# Patient Record
Sex: Female | Born: 1945
Health system: Southern US, Community
[De-identification: ages and names within clinical notes are randomized; demographics above are authoritative.]

## PROBLEM LIST (undated history)

## (undated) DIAGNOSIS — M5136 Other intervertebral disc degeneration, lumbar region: Secondary | ICD-10-CM

## (undated) DIAGNOSIS — F329 Major depressive disorder, single episode, unspecified: Secondary | ICD-10-CM

## (undated) DIAGNOSIS — R251 Tremor, unspecified: Secondary | ICD-10-CM

## (undated) DIAGNOSIS — E119 Type 2 diabetes mellitus without complications: Secondary | ICD-10-CM

## (undated) DIAGNOSIS — I2510 Atherosclerotic heart disease of native coronary artery without angina pectoris: Secondary | ICD-10-CM

## (undated) DIAGNOSIS — F32A Depression, unspecified: Secondary | ICD-10-CM

## (undated) DIAGNOSIS — K219 Gastro-esophageal reflux disease without esophagitis: Secondary | ICD-10-CM

## (undated) DIAGNOSIS — J449 Chronic obstructive pulmonary disease, unspecified: Secondary | ICD-10-CM

## (undated) DIAGNOSIS — M1990 Unspecified osteoarthritis, unspecified site: Secondary | ICD-10-CM

## (undated) DIAGNOSIS — M5030 Other cervical disc degeneration, unspecified cervical region: Secondary | ICD-10-CM

## (undated) DIAGNOSIS — M51369 Other intervertebral disc degeneration, lumbar region without mention of lumbar back pain or lower extremity pain: Secondary | ICD-10-CM

## (undated) DIAGNOSIS — Z950 Presence of cardiac pacemaker: Secondary | ICD-10-CM

## (undated) DIAGNOSIS — E785 Hyperlipidemia, unspecified: Secondary | ICD-10-CM

## (undated) DIAGNOSIS — I509 Heart failure, unspecified: Secondary | ICD-10-CM

## (undated) DIAGNOSIS — I10 Essential (primary) hypertension: Secondary | ICD-10-CM

## (undated) HISTORY — PX: EYE SURGERY: SHX253

## (undated) HISTORY — DX: Atherosclerotic heart disease of native coronary artery without angina pectoris: I25.10

## (undated) HISTORY — DX: Gastro-esophageal reflux disease without esophagitis: K21.9

## (undated) HISTORY — PX: PACEMAKER INSERTION: SHX728

## (undated) HISTORY — DX: Unspecified osteoarthritis, unspecified site: M19.90

## (undated) HISTORY — DX: Other cervical disc degeneration, unspecified cervical region: M50.30

## (undated) HISTORY — DX: Tremor, unspecified: R25.1

## (undated) HISTORY — PX: SPINE SURGERY: SHX786

## (undated) HISTORY — DX: Hyperlipidemia, unspecified: E78.5

## (undated) HISTORY — DX: Other intervertebral disc degeneration, lumbar region: M51.36

## (undated) HISTORY — PX: FOOT SURGERY: SHX648

## (undated) HISTORY — DX: Other intervertebral disc degeneration, lumbar region without mention of lumbar back pain or lower extremity pain: M51.369

## (undated) HISTORY — DX: Presence of cardiac pacemaker: Z95.0

## (undated) HISTORY — PX: ABDOMINAL HYSTERECTOMY: SHX81

## (undated) HISTORY — PX: CHOLECYSTECTOMY: SHX55

---

## 1992-03-06 HISTORY — PX: BREAST BIOPSY: SHX20

## 2011-08-14 ENCOUNTER — Ambulatory Visit: Payer: Self-pay | Admitting: Rheumatology

## 2011-11-01 ENCOUNTER — Ambulatory Visit: Payer: Self-pay | Admitting: Pain Medicine

## 2011-12-06 ENCOUNTER — Ambulatory Visit: Payer: Self-pay | Admitting: Pain Medicine

## 2011-12-12 ENCOUNTER — Ambulatory Visit: Payer: Self-pay | Admitting: Pain Medicine

## 2011-12-27 ENCOUNTER — Ambulatory Visit: Payer: Self-pay | Admitting: Pain Medicine

## 2011-12-28 ENCOUNTER — Ambulatory Visit: Payer: Self-pay | Admitting: Pain Medicine

## 2012-01-17 ENCOUNTER — Ambulatory Visit: Payer: Self-pay | Admitting: Pain Medicine

## 2012-01-23 ENCOUNTER — Ambulatory Visit: Payer: Self-pay | Admitting: Pain Medicine

## 2012-02-12 ENCOUNTER — Ambulatory Visit: Payer: Self-pay | Admitting: Pain Medicine

## 2012-03-22 ENCOUNTER — Ambulatory Visit: Payer: Self-pay | Admitting: Pain Medicine

## 2012-03-28 ENCOUNTER — Ambulatory Visit: Payer: Self-pay | Admitting: Pain Medicine

## 2012-04-11 ENCOUNTER — Ambulatory Visit: Payer: Self-pay | Admitting: Pain Medicine

## 2012-04-29 ENCOUNTER — Ambulatory Visit: Payer: Self-pay | Admitting: Pain Medicine

## 2012-05-09 ENCOUNTER — Ambulatory Visit: Payer: Self-pay | Admitting: Pain Medicine

## 2012-05-22 ENCOUNTER — Ambulatory Visit: Payer: Self-pay | Admitting: Pain Medicine

## 2012-07-01 ENCOUNTER — Ambulatory Visit: Payer: Self-pay | Admitting: Nurse Practitioner

## 2012-07-10 ENCOUNTER — Ambulatory Visit: Payer: Self-pay | Admitting: Internal Medicine

## 2012-07-22 IMAGING — CR LEFT WRIST - COMPLETE 3+ VIEW
1 series · 4 of 4 positions shown · non-contrast
Comparison: none

REASON FOR EXAM: pain, arthritis
COMMENTS:

PROCEDURE:     DXR - DXR WRIST LT COMP WITH OBLIQUES  - August 14, 2011  [DATE]
RESULT:     Comparison: None.

[Series 1: pa · 0.17mm/px · 4 of 4 slices shown]
[im 1/4]
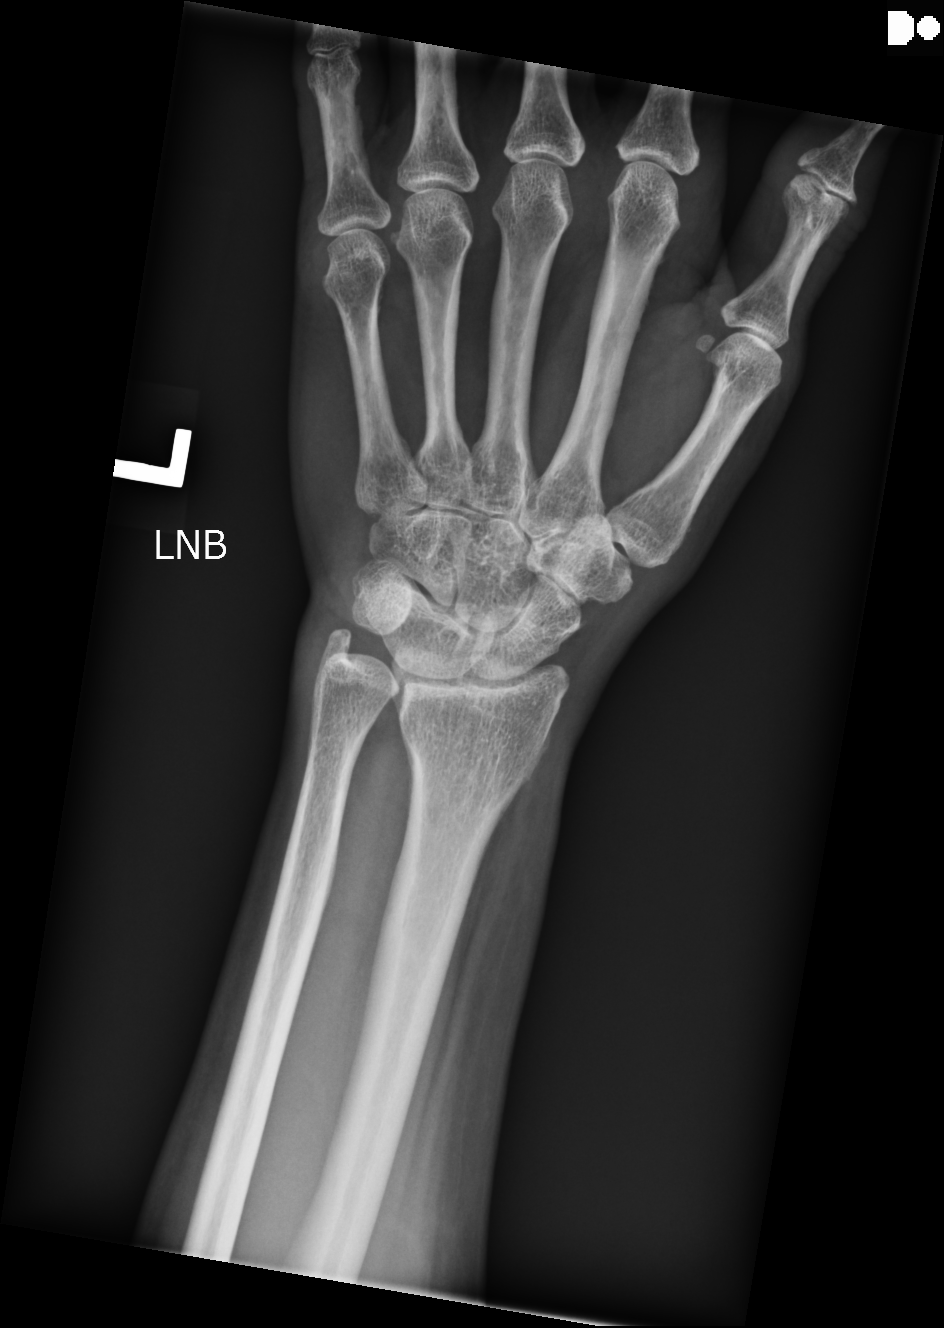
[im 2/4]
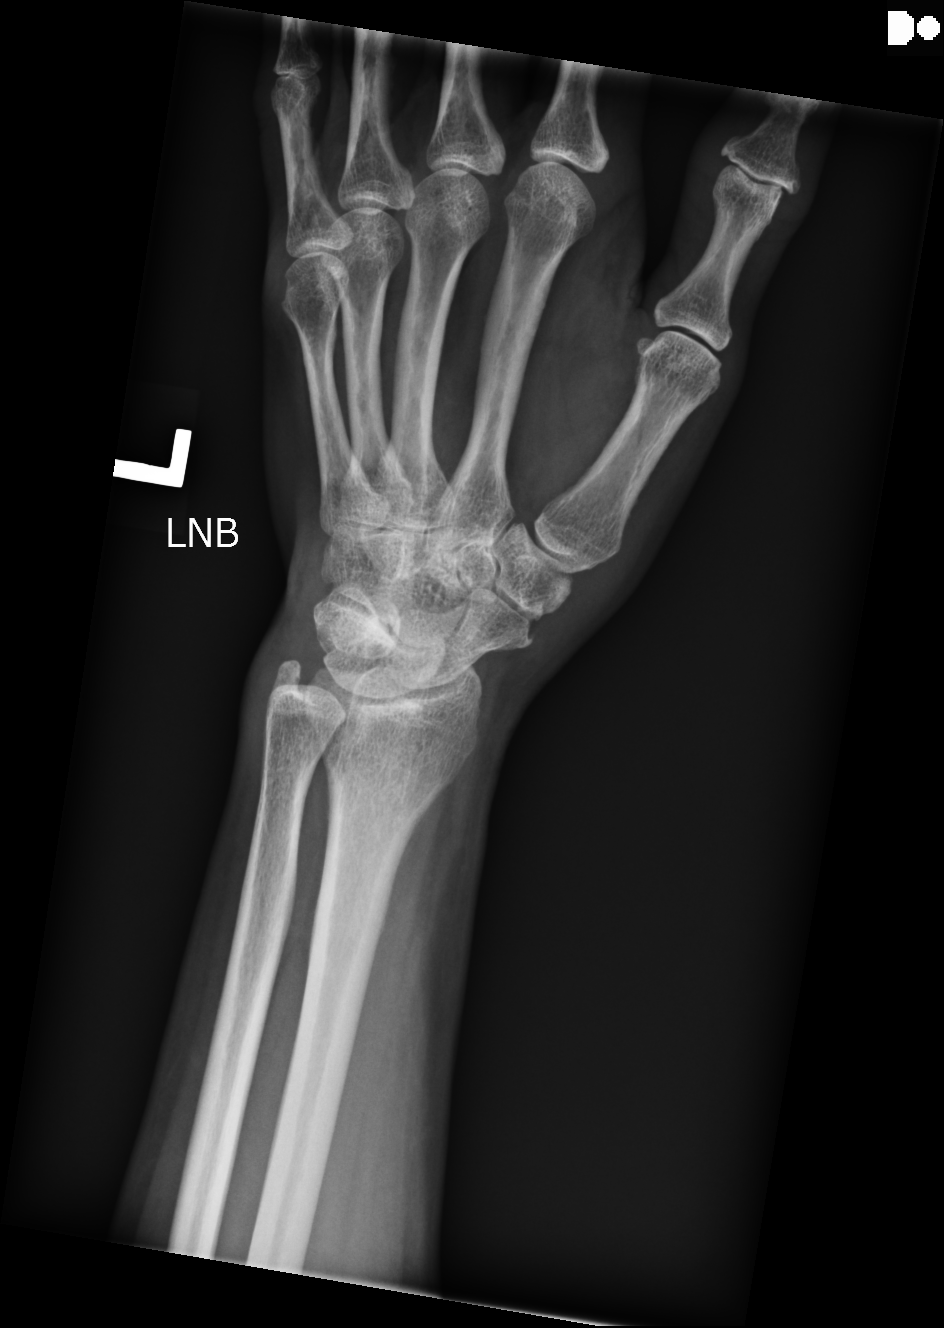
[im 3/4]
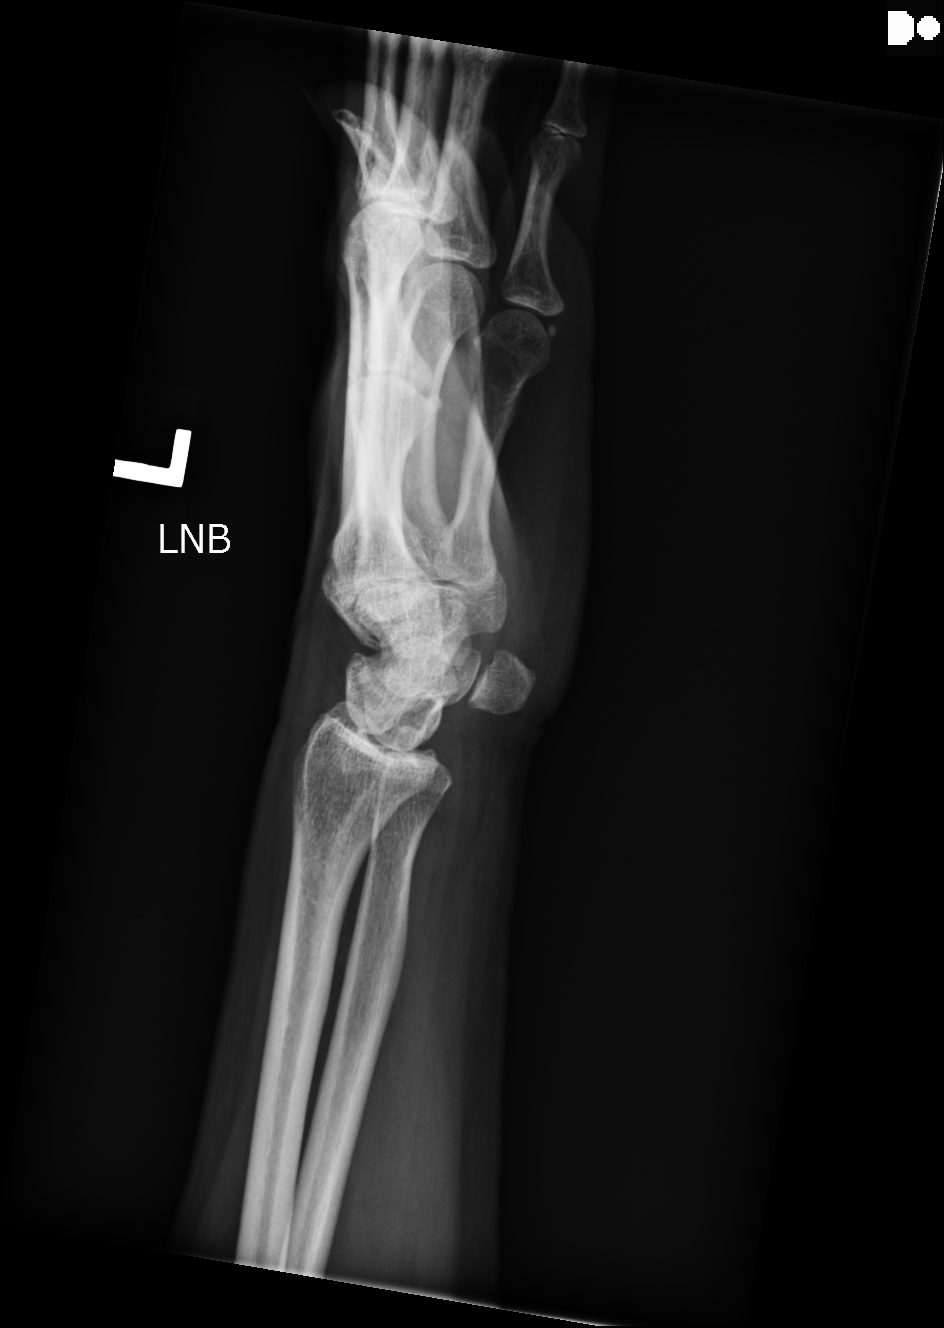
[im 4/4]
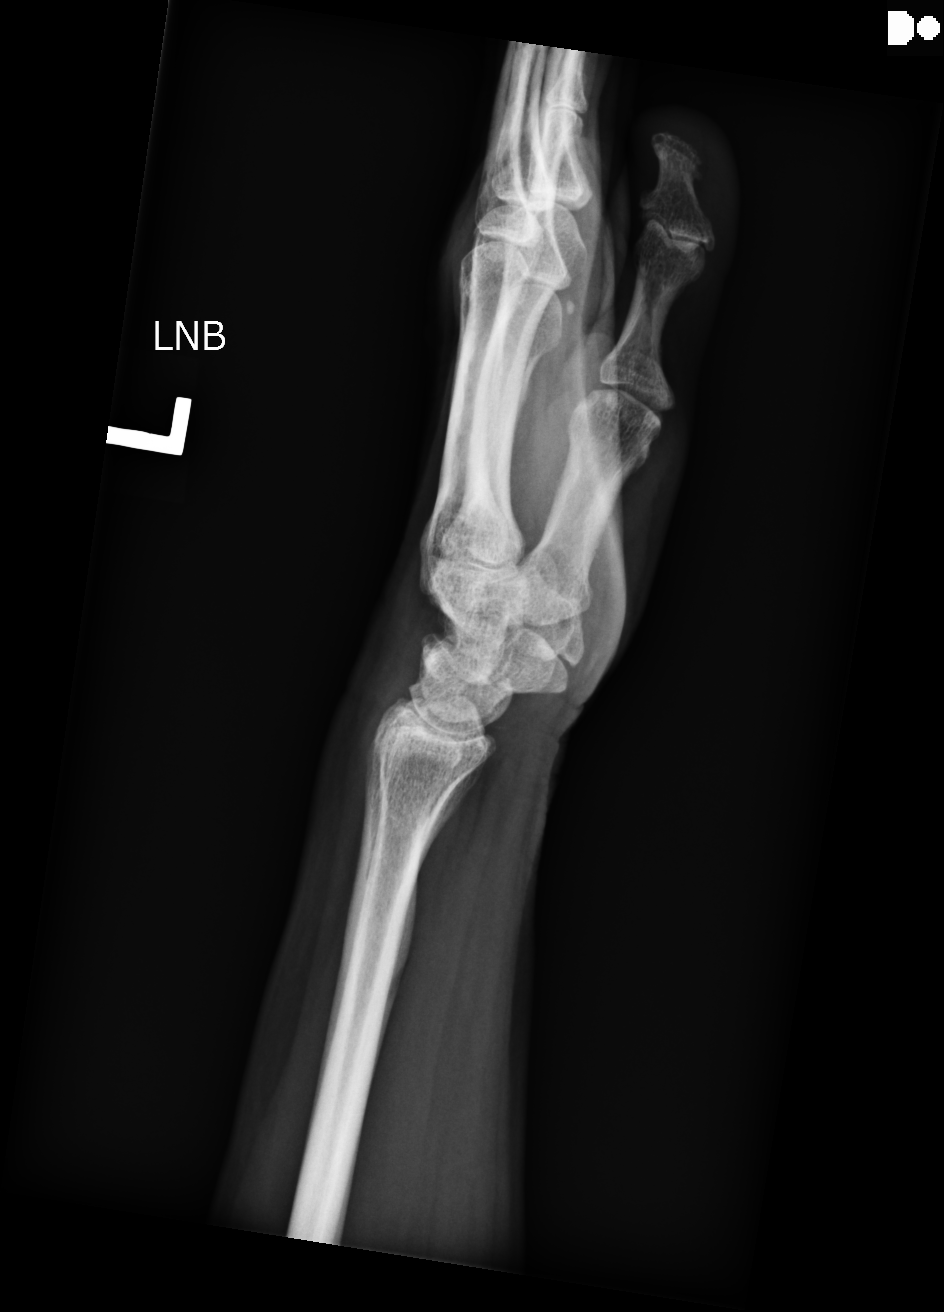

[4 of 4 positions shown; findings below may reference images not displayed]

FINDINGS: No acute fracture. There is congenital fusion of the lunate and triquetrum.
There is normal alignment. Joint spaces are relatively preserved.
IMPRESSION: No acute findings.

If there is continued clinical concern for a radiographically occult
scaphoid fracture, such as snuff box tenderness, further evaluation with MRI
or immobilization and followup radiographs is recommended.

## 2012-07-22 IMAGING — CR DG HAND COMPLETE 3+V*L*
1 series · 3 of 3 positions shown · non-contrast
Comparison: none

REASON FOR EXAM: pain, arthritis
COMMENTS:

PROCEDURE:     DXR - DXR HAND LT COMPLETE  W/OBLIQUES  - August 14, 2011  [DATE]
RESULT:     Comparison: None.

[Series 1: pa · 0.17mm/px · 3 of 3 slices shown]
[im 1/3]
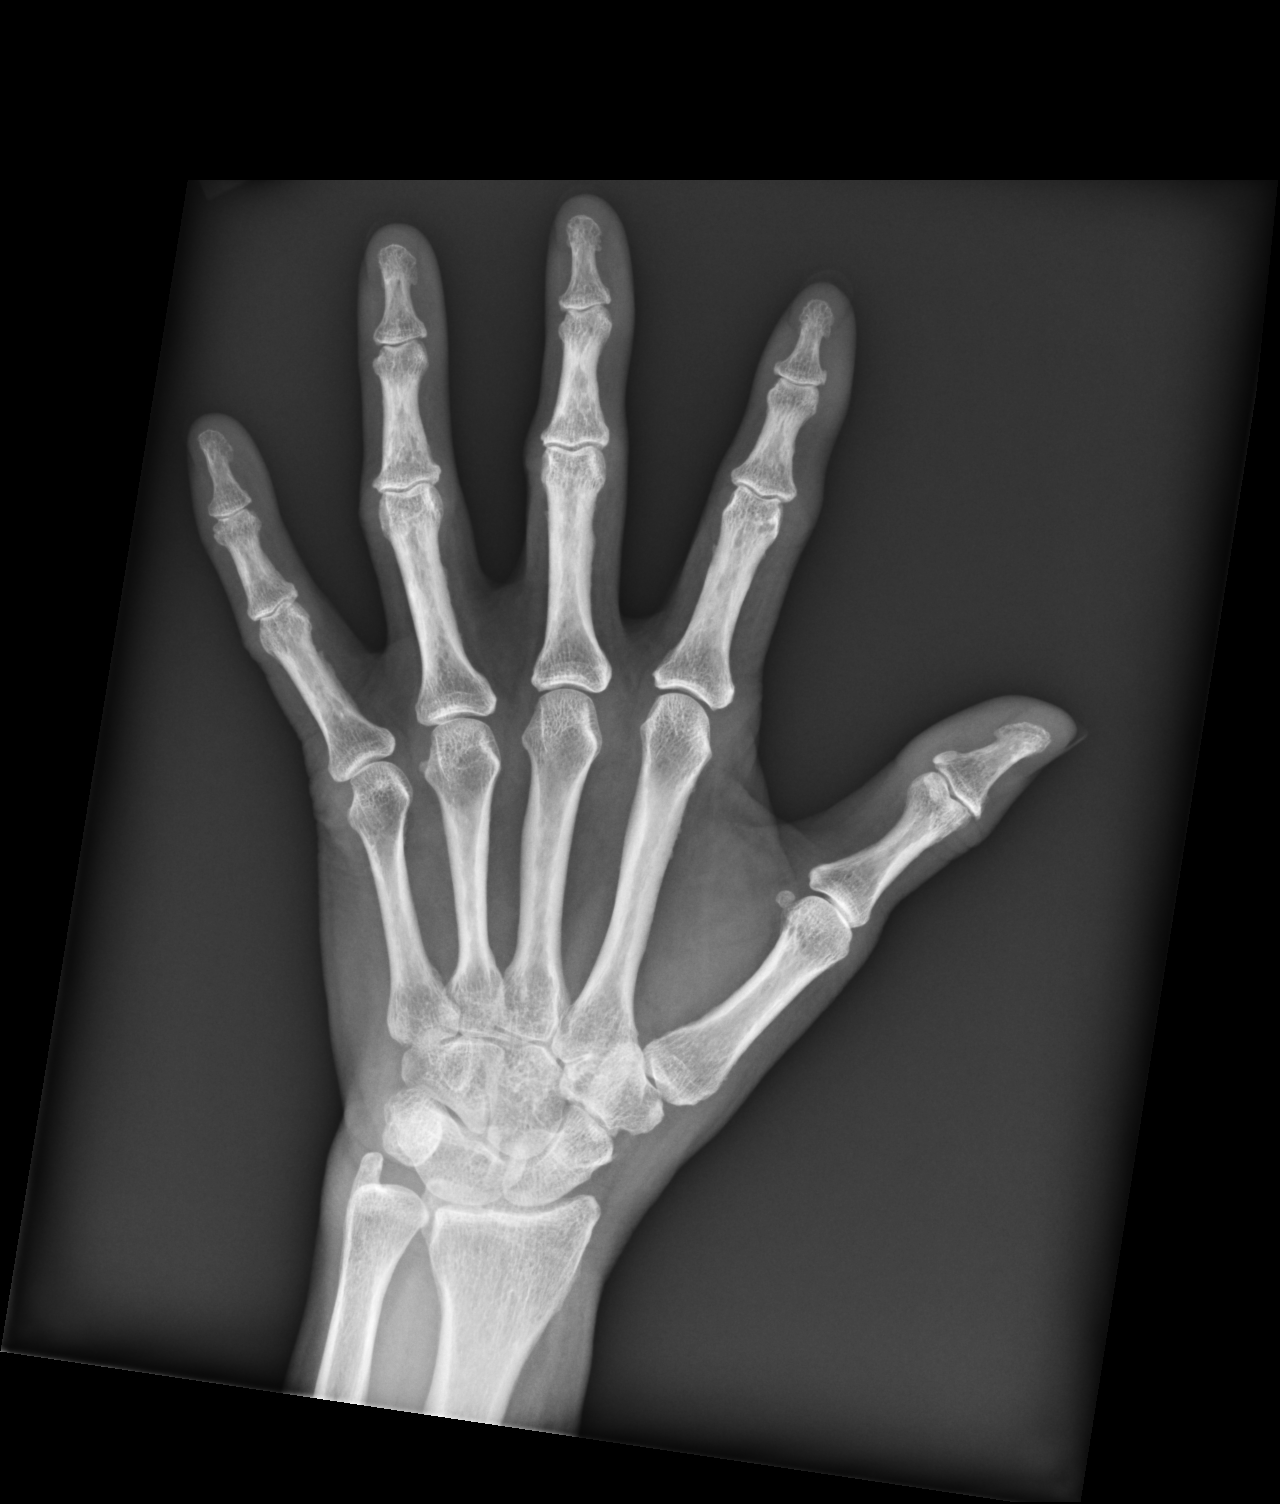
[im 2/3]
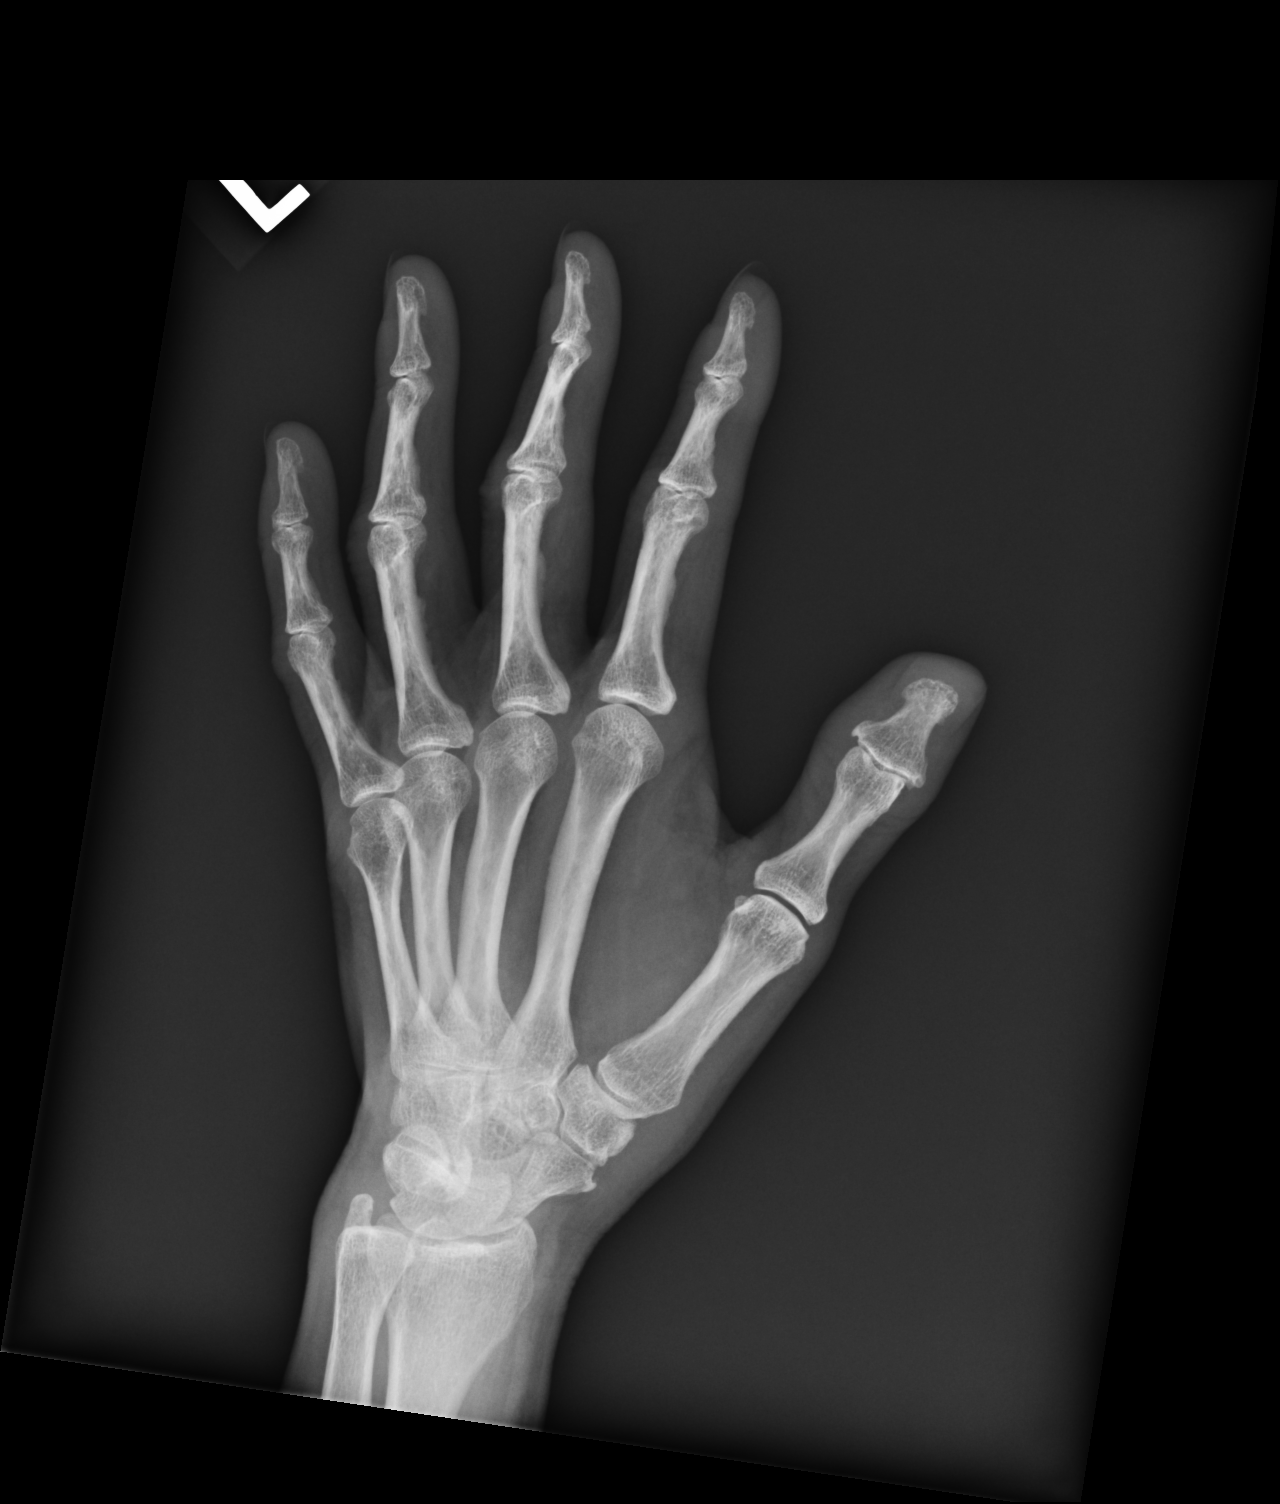
[im 3/3]
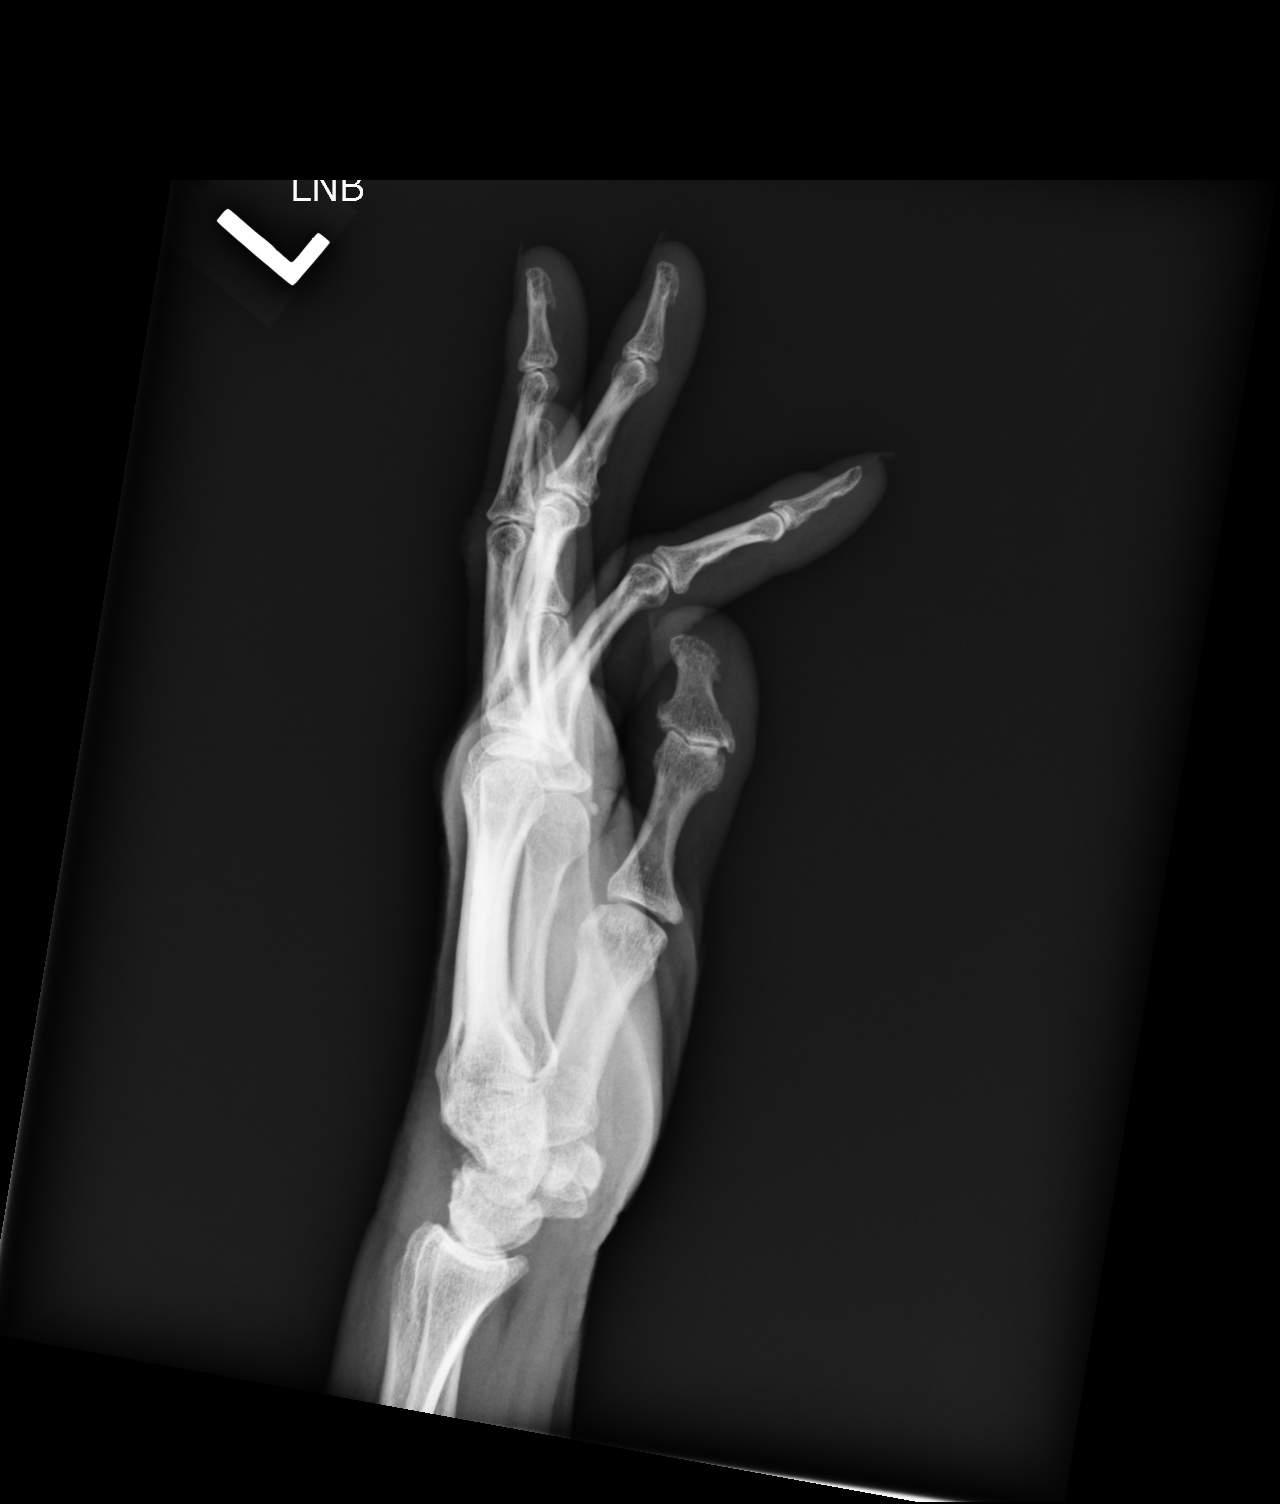

[3 of 3 positions shown; findings below may reference images not displayed]

FINDINGS: There is minimal joint space loss and osteophytosis of the distal and
proximal interphalangeal joints of the index through fifth fingers. This is
seen in the interphalangeal joint of the thumb, as well. No acute fracture.
IMPRESSION: Minimal findings of osteoarthritis.

## 2012-08-01 ENCOUNTER — Ambulatory Visit: Payer: Self-pay | Admitting: Nurse Practitioner

## 2012-08-04 ENCOUNTER — Ambulatory Visit: Payer: Self-pay | Admitting: Nurse Practitioner

## 2012-10-09 IMAGING — CR BILATERAL SACROILIAC JOINTS - 3+ VIEW
1 series · 3 of 3 positions shown · non-contrast
Comparison: none

REASON FOR EXAM: hip pain, low back pain, neck pain, generalized pain
COMMENTS:

PROCEDURE:     DXR - DXR SACROILIAC JOINTS  - November 01, 2011  [DATE]
RESULT:     Comparison: None.

[Series 1: t sacrum ap · 0.14mm/px · 3 of 3 slices shown]
[im 1/3]
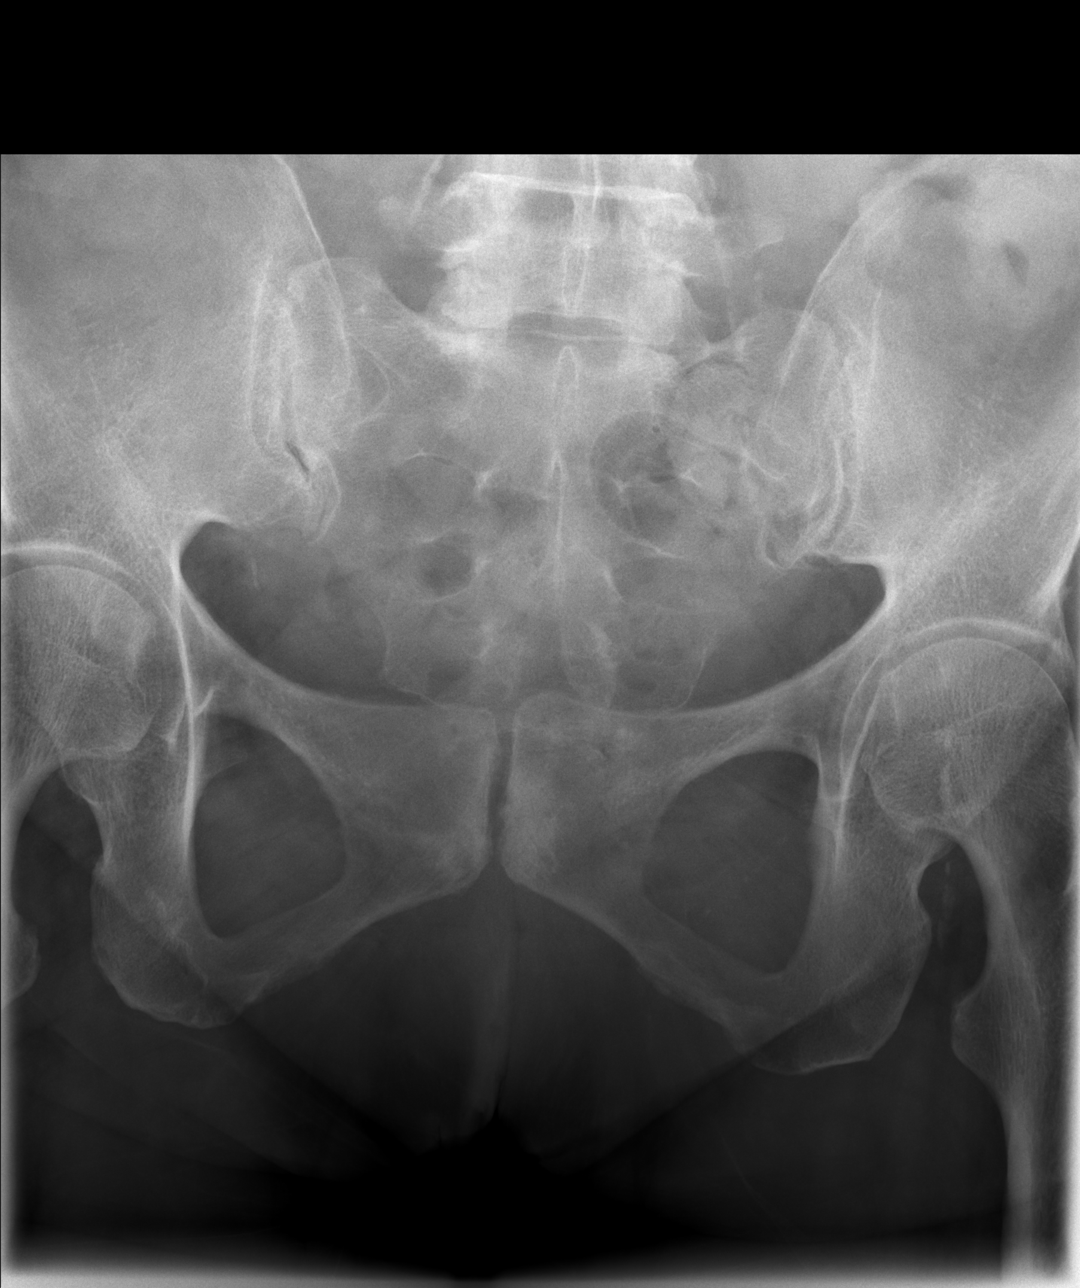
[im 2/3]
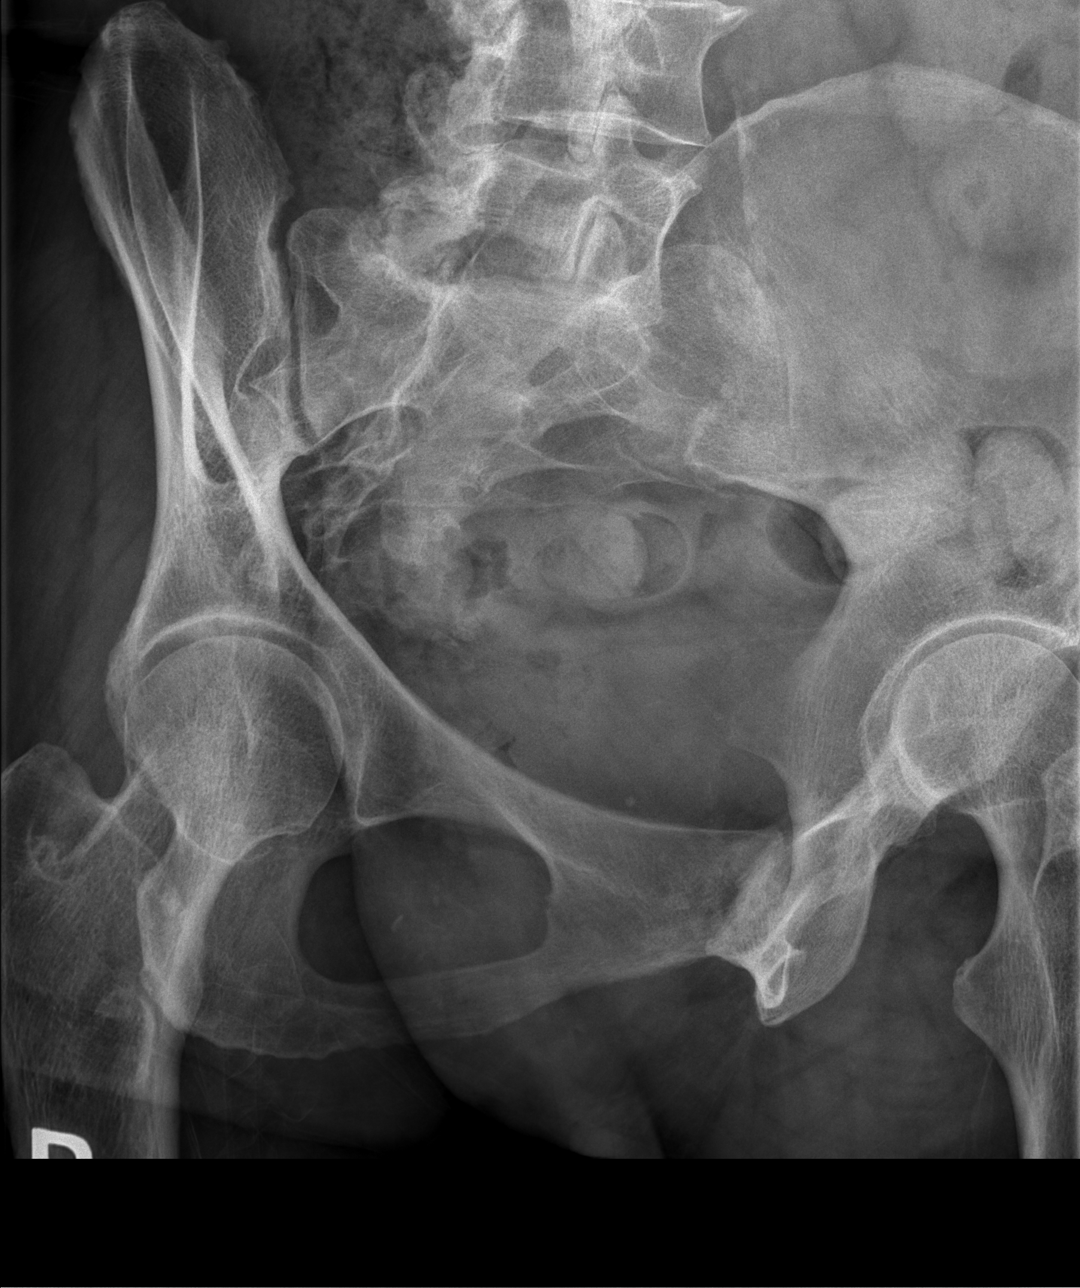
[im 3/3]
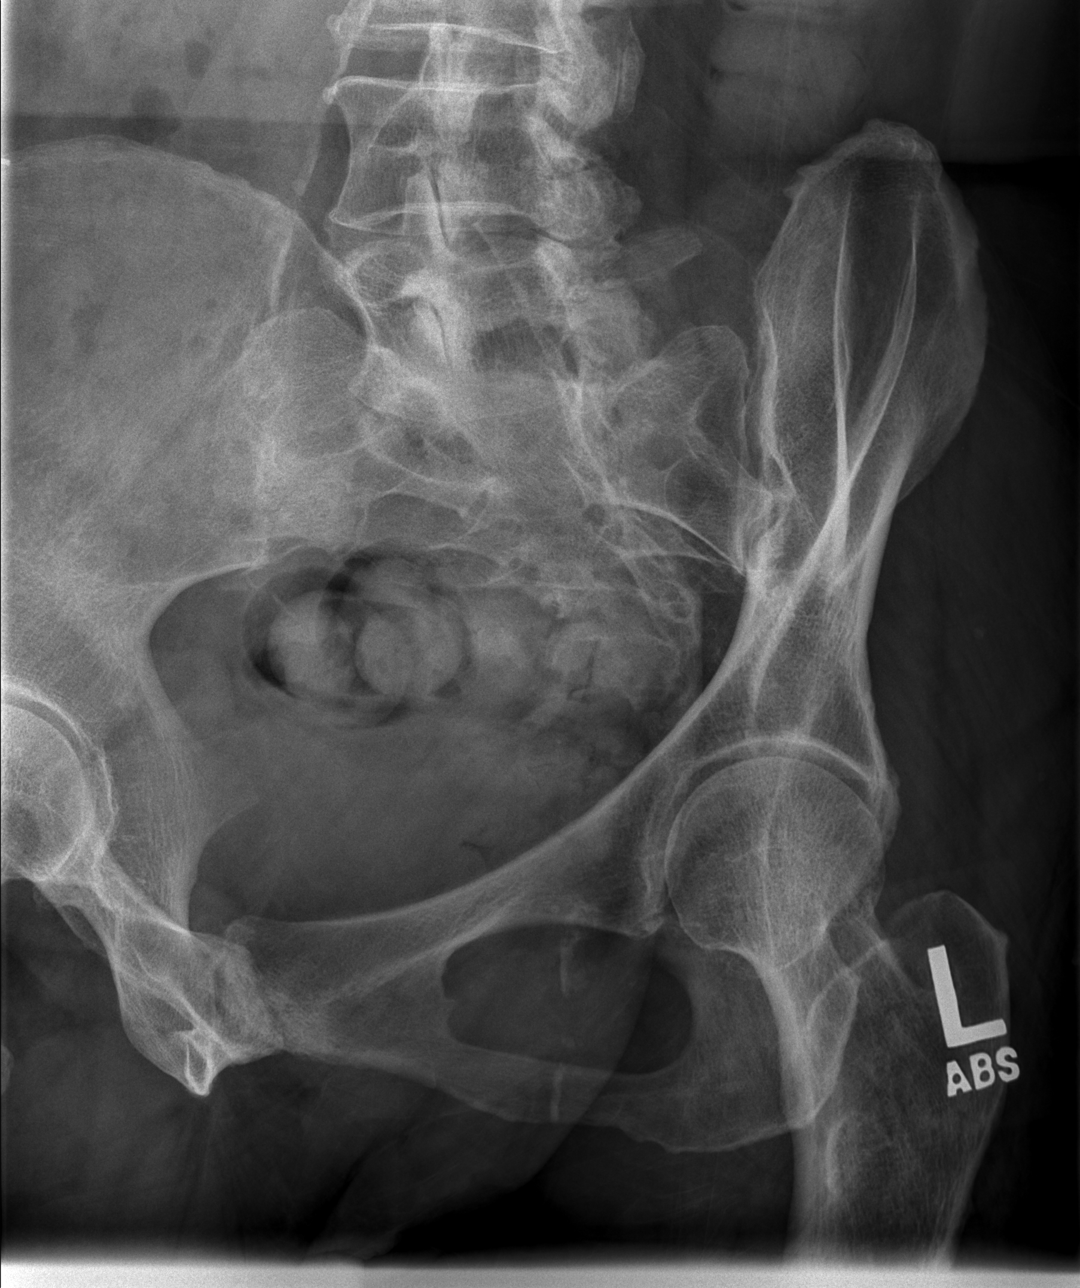

[3 of 3 positions shown; findings below may reference images not displayed]

FINDINGS: The sacroiliac joints are patent. No erosive changes. There is mild
sclerosis about the pubic symphysis, likely secondary to reactive changes.
IMPRESSION: Unremarkable sacroiliac joints.

[REDACTED]

## 2012-10-09 IMAGING — CR DG HIP COMPLETE 2+V*L*
1 series · 2 of 2 positions shown · non-contrast
Comparison: none

REASON FOR EXAM: hip pain, low back pain, neck pain, generalized pain
COMMENTS:

PROCEDURE:     DXR - DXR HIP LEFT COMPLETE  - November 01, 2011  [DATE]
RESULT:     Comparison: None.

[Series 1: t hip ap left · 0.14mm/px · 2 of 2 slices shown]
[im 1/2]
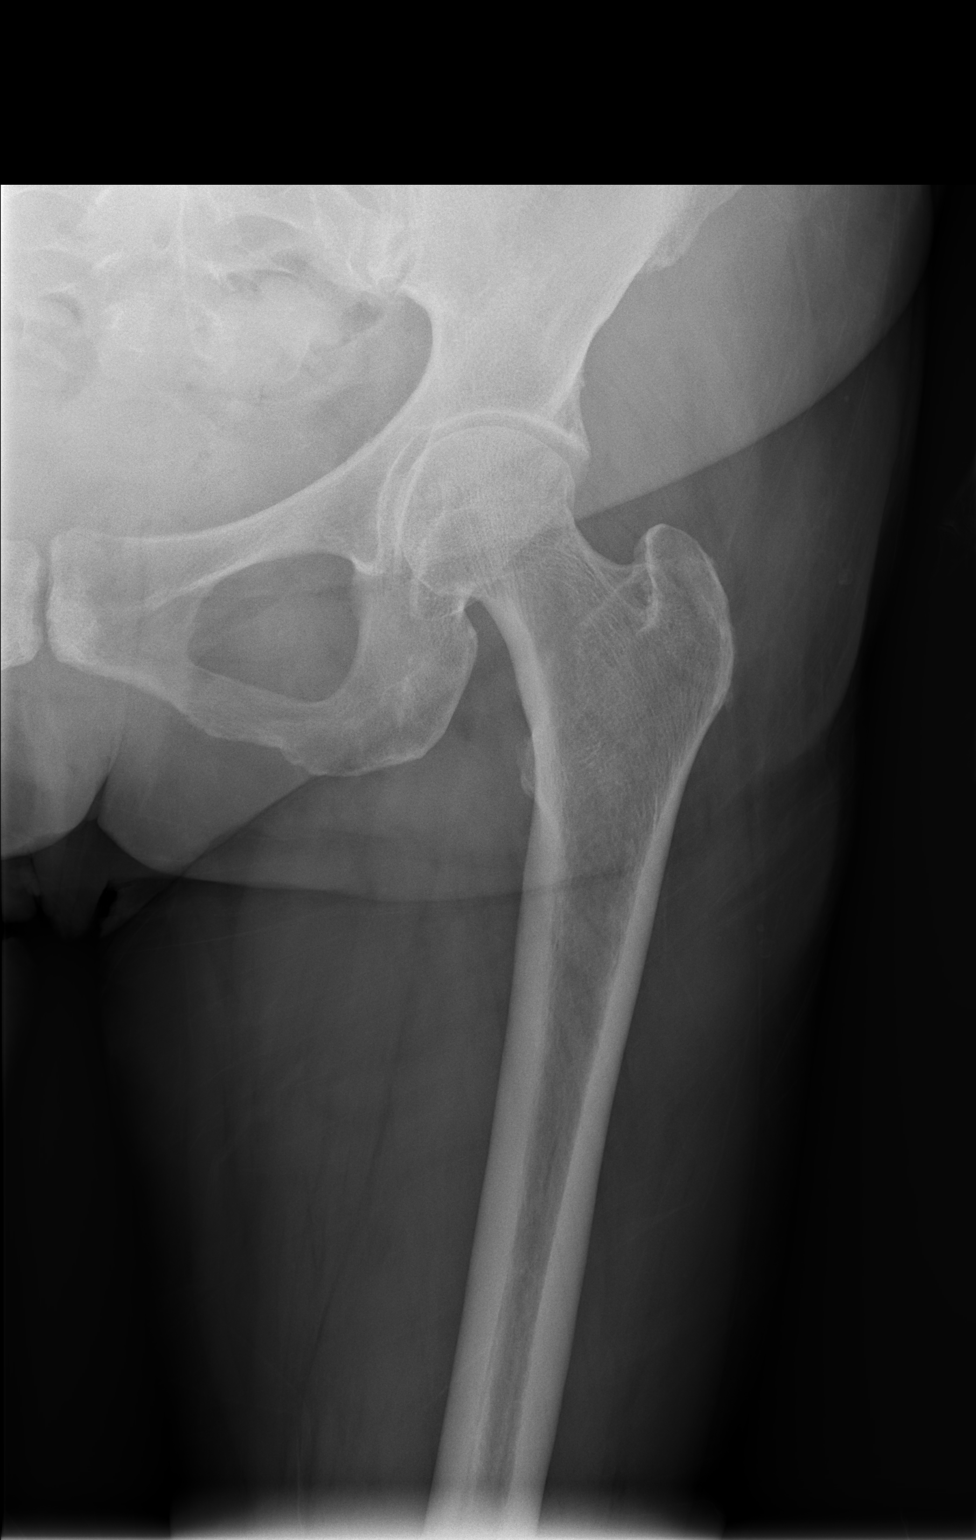
[im 2/2]
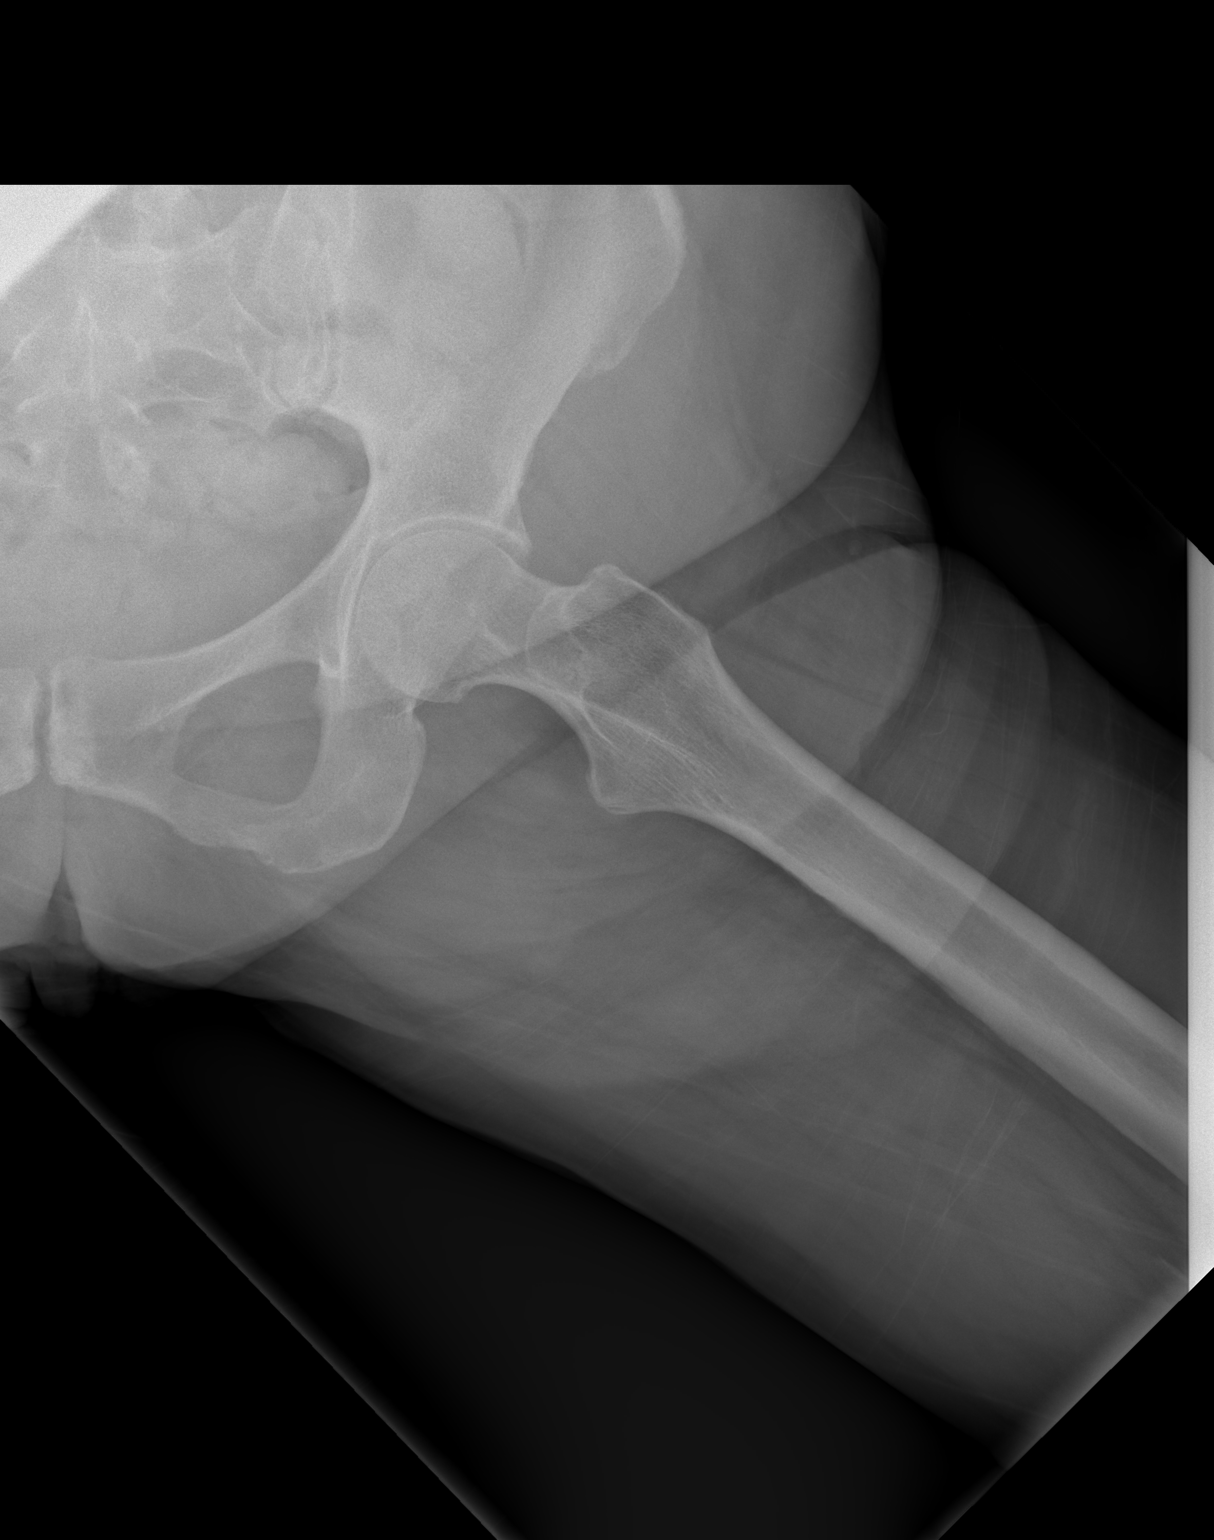

[2 of 2 positions shown; findings below may reference images not displayed]

FINDINGS: No acute fracture. Hip joint space is maintained. No cortical thickening or
periostitis. There is mild sclerosis about the pubic symphysis which may
represent early degenerative change.
IMPRESSION: No acute findings.

[REDACTED]

## 2012-10-09 IMAGING — CR RIGHT HIP - COMPLETE 2+ VIEW
1 series · 2 of 2 positions shown · non-contrast
Comparison: none

REASON FOR EXAM: hip pain, low back pain, neck pain, generalized pain
COMMENTS:

PROCEDURE:     DXR - DXR HIP RIGHT COMPLETE  - November 01, 2011  [DATE]
RESULT:     Comparison: None.

[Series 1: t hip ap right · 0.14mm/px · 2 of 2 slices shown]
[im 1/2]
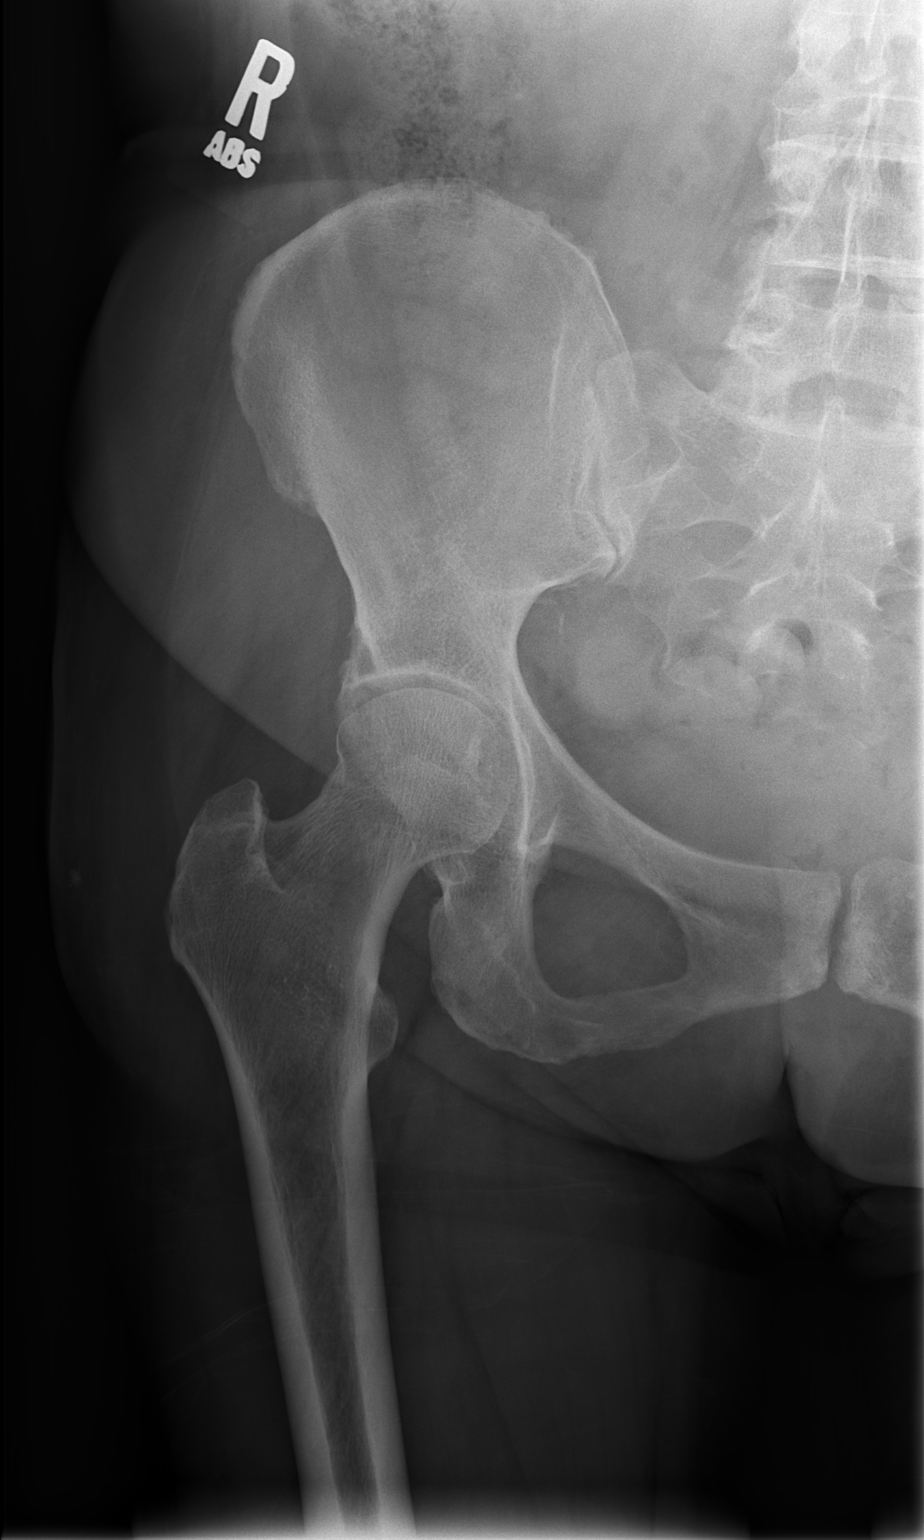
[im 2/2]
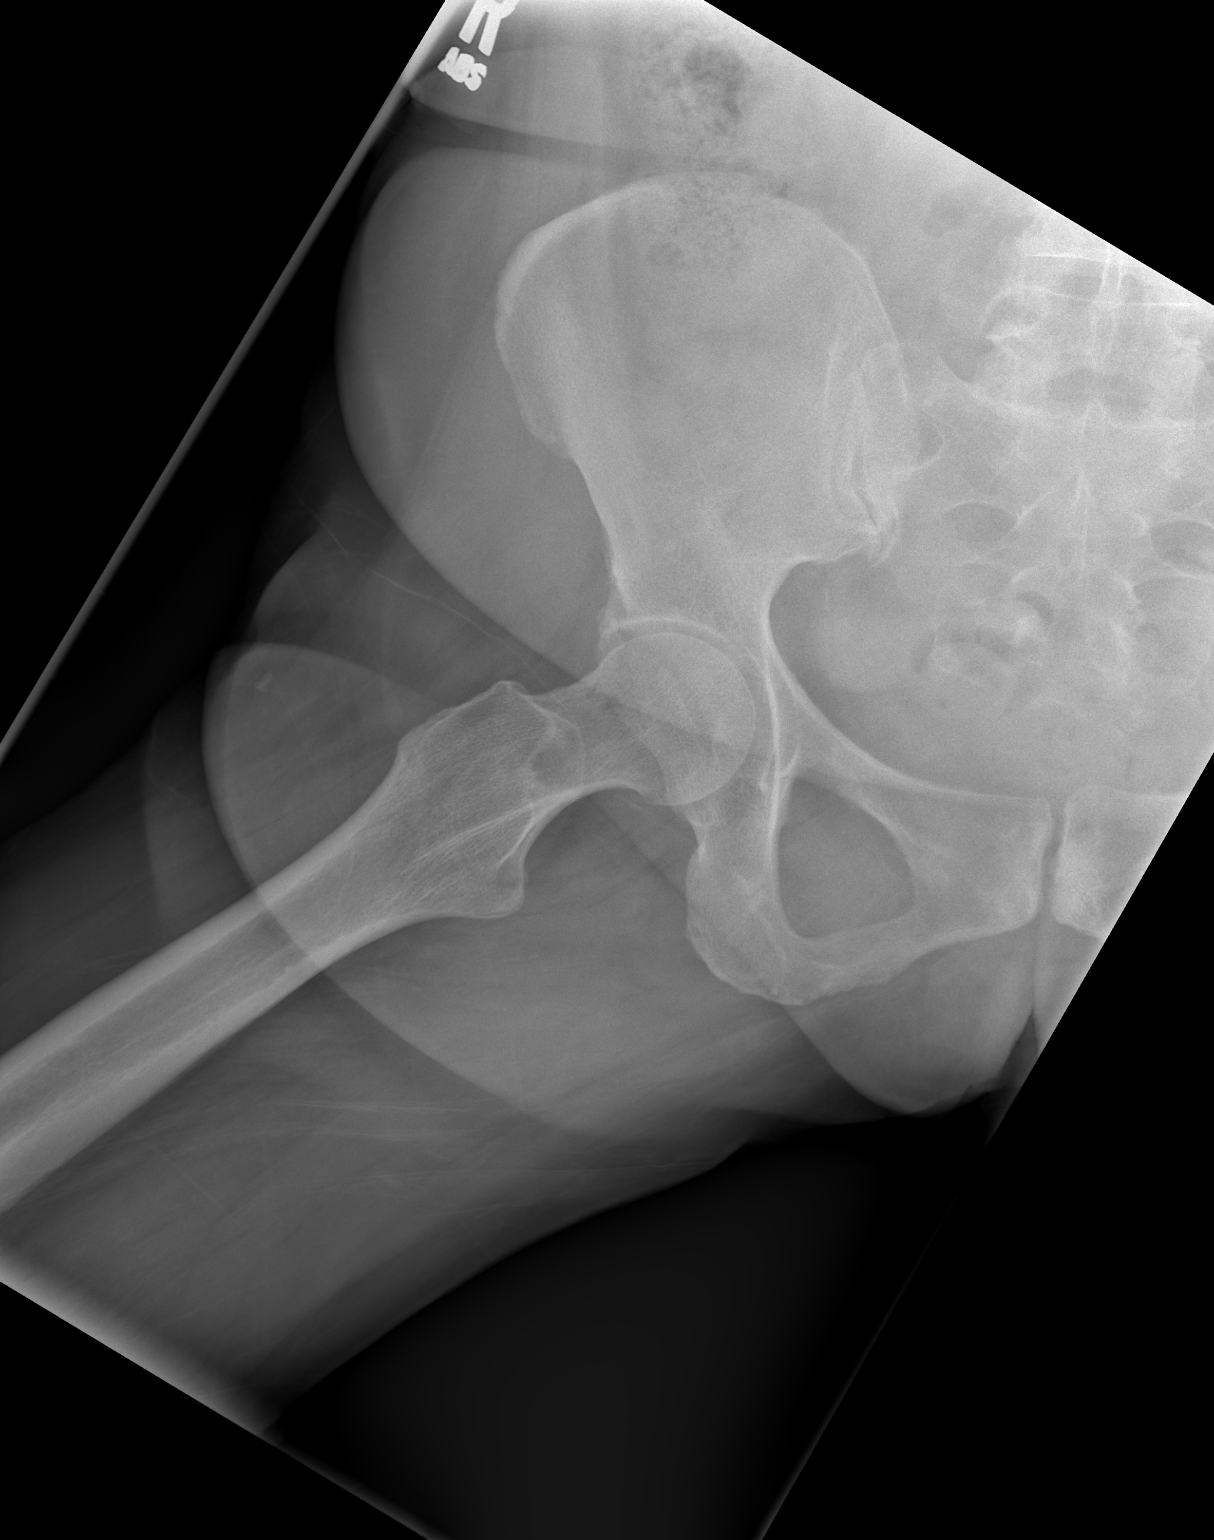

[2 of 2 positions shown; findings below may reference images not displayed]

FINDINGS: No acute fracture. Hip joint space is maintained. No cortical thickening or
periostitis. There is mild sclerosis about the pubic symphysis which may
represent early degenerative change.
IMPRESSION: No acute findings.

[REDACTED]

## 2012-10-09 IMAGING — CR DG [PERSON_NAME] SPINE FLEX
1 series · 3 of 3 positions shown · non-contrast
Comparison: none

REASON FOR EXAM: hip pain, low back pain, neck pain, generalized pain
COMMENTS:

PROCEDURE:     DXR - DXR L-SPINE FLEX/EXTENSION ONLY  - November 01, 2011  [DATE]
RESULT:     Comparison: None.

[Series 1: w lumbar spine lat · 0.14mm/px · 3 of 3 slices shown]
[im 1/3]
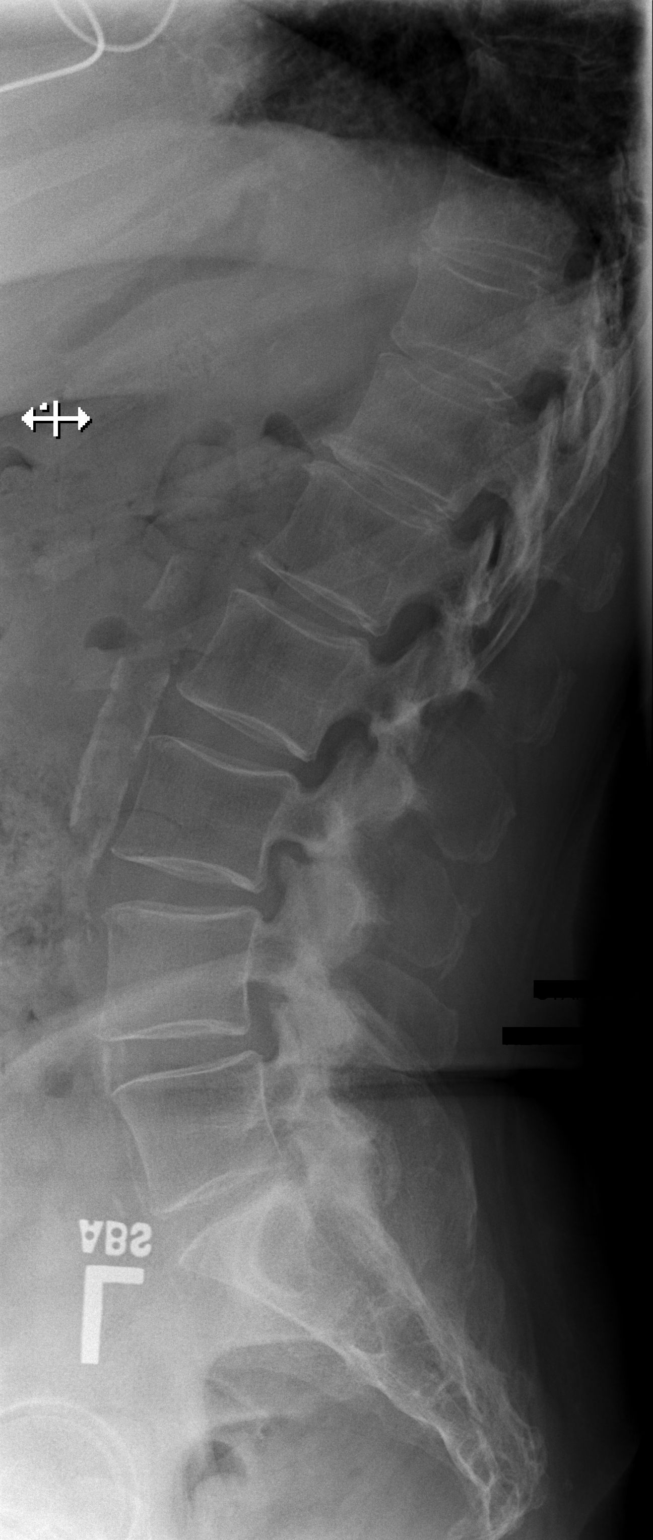
[im 2/3]
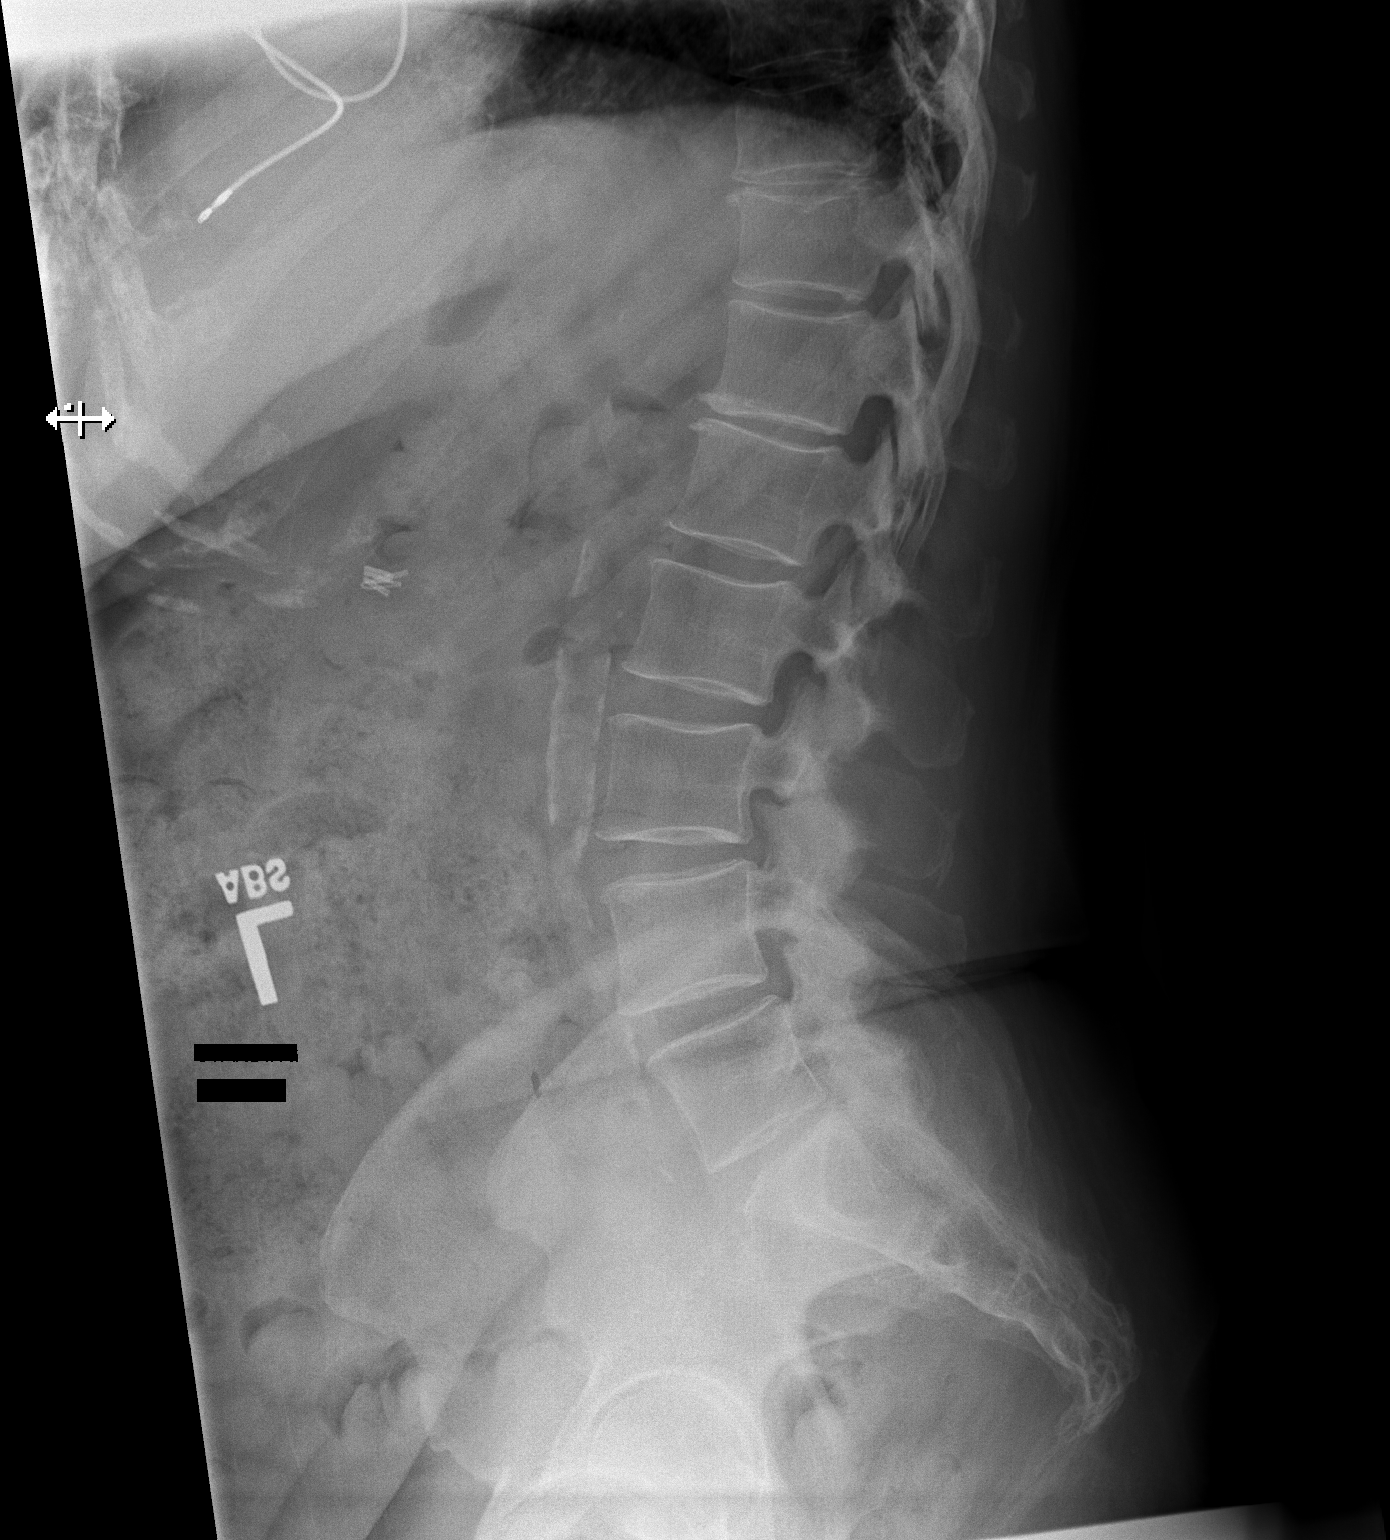
[im 3/3]
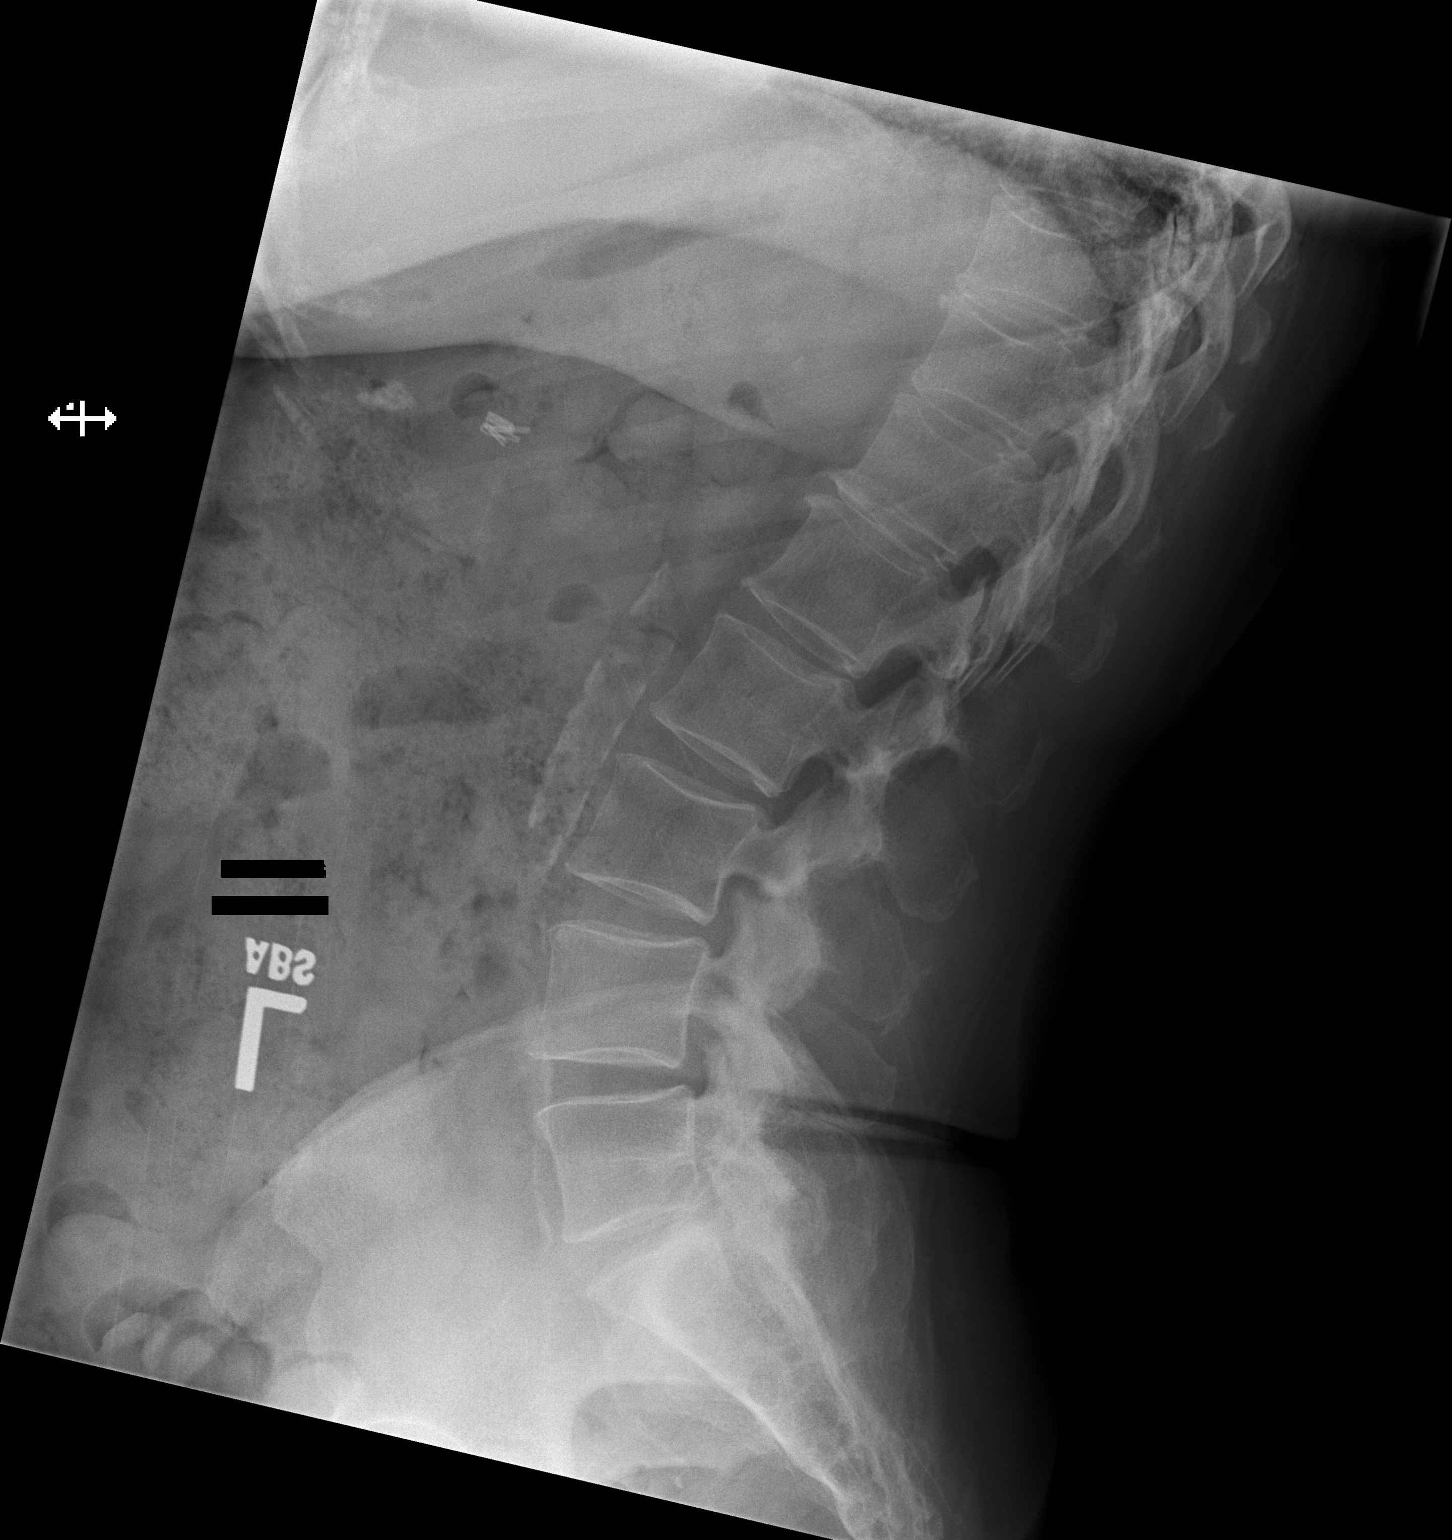

[3 of 3 positions shown; findings below may reference images not displayed]

FINDINGS: Lack of AP view slightly limits evaluation. Vertebral body heights are
relatively preserved. There is mild intervertebral disc height loss of L5-S1
and T12-L1. There is normal alignment. Normal alignment is maintained with
flexion and extension. There is possible degenerative facet disease in the
lower lumbar spine.
IMPRESSION: 1. Normal alignment with flexion and extension.
2. Mild intervertebral disc height loss of T12-L1 and L5-S1.

[REDACTED]

## 2012-10-09 IMAGING — CR PELVIS - 1-2 VIEW
1 series · 1 of 1 positions shown · non-contrast
Comparison: none

REASON FOR EXAM: hip pain; ; myalgia and myositis
COMMENTS:

PROCEDURE:     DXR - DXR PELVIS AP ONLY  - November 01, 2011  [DATE]
RESULT:     Comparison: None.

[t pelvis ap]
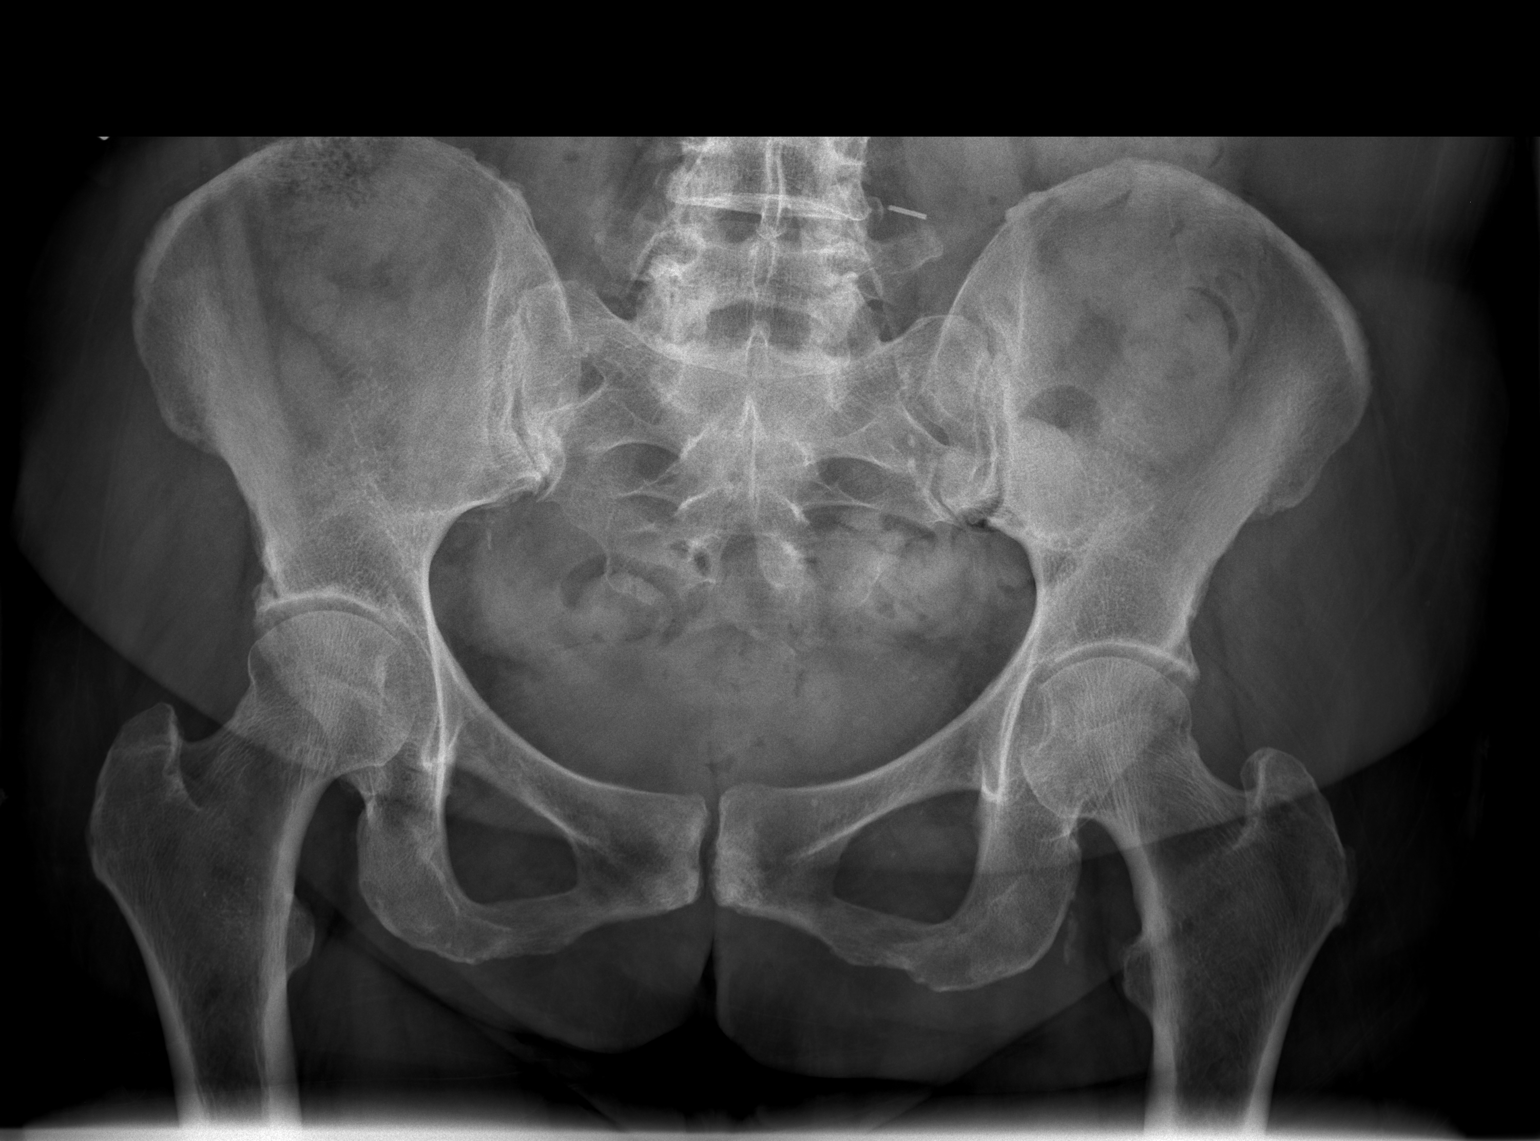

[1 of 1 positions shown; findings below may reference images not displayed]

FINDINGS: No acute fracture. There is minimal sclerosis about the pubic symphysis
which may related to early degenerative changes. The hip joint spaces are
relatively preserved. Small calcific density adjacent to the left ischium
may represent sequela of old prior trauma or vascular calcification.
IMPRESSION: No acute findings.

[REDACTED]

## 2012-10-14 ENCOUNTER — Ambulatory Visit: Payer: Self-pay | Admitting: Unknown Physician Specialty

## 2013-01-18 ENCOUNTER — Emergency Department: Payer: Self-pay | Admitting: Emergency Medicine

## 2013-01-21 ENCOUNTER — Ambulatory Visit: Payer: Self-pay | Admitting: Pain Medicine

## 2013-02-12 ENCOUNTER — Ambulatory Visit: Payer: Self-pay | Admitting: Pain Medicine

## 2013-03-06 IMAGING — CT CT LUMBAR SPINE WITHOUT CONTRAST
1 of 7 series · 2 of 14 positions shown, 3 images · non-contrast
Comparison: none

REASON FOR EXAM: Pt Has Pacemaker Low Back Pain Lumbar Radiculitis
COMMENTS:

PROCEDURE:     KCT - KCT LUMBAR SPINE WO CONTRAST  - March 28, 2012  [DATE]
RESULT:     Comparison: None.
TECHNIQUE: Multiple axial images were obtained of the lumbar spine without
intravenous contrast.  Coronal and sagittal reformats were performed.

[Series 607: axial soft tissue range 6 · axial · 0.35mm/px · z∈[-600,-567]mm · 2 of 82 slices shown, 3 images]
[im 1/82  soft-tissue]
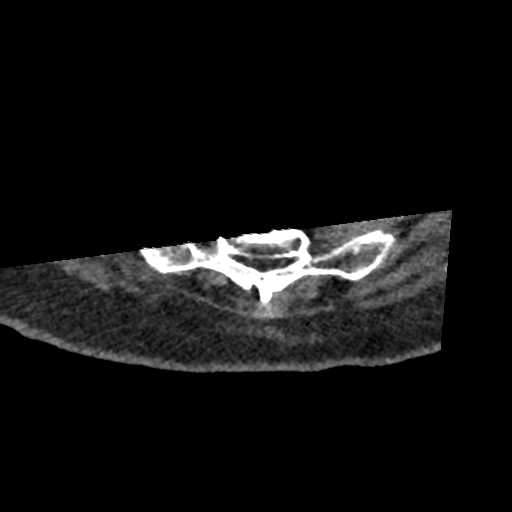
[im 1/82  bone]
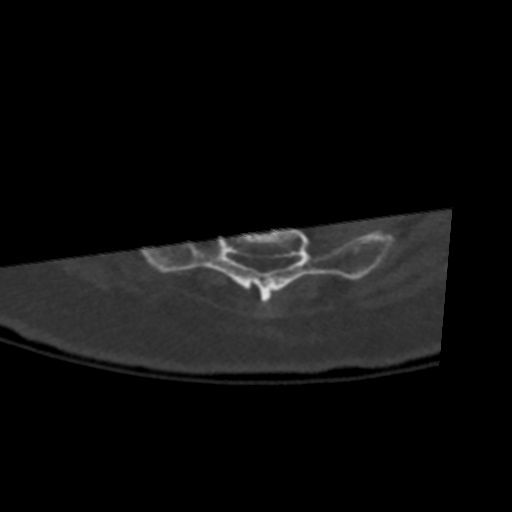
[im 41/82  bone]
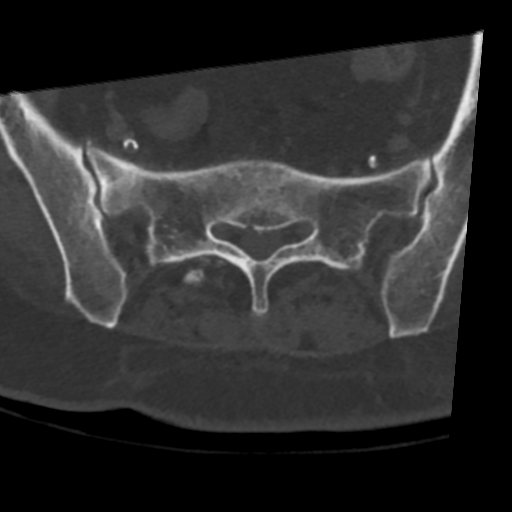

[2 of 14 positions shown; findings below may reference images not displayed]

FINDINGS: There is normal anatomic alignment of the lumbar spine. The vertebral body
heights are preserved.  No fractures or dislocations are identified.

T12-L1: No canal stenosis or neuroforaminal narrowing.

L1-L2: No canal stenosis or neuroforaminal narrowing.

L2-L3: Posterior disc bulge and mild degenerative facet disease with
ligamentum flavum buckling cause approximately moderate canal stenosis. No
neuroforaminal narrowing.

L3-L4: Posterior disc bulge and mild degenerative facet disease with
ligamentum flavum buckling moderate canal stenosis. There is moderate
bilateral neural foraminal narrowing.

L4-L5: Posterior disc bulge as well as mild degenerative disease and
ligamentum flavum hypertrophy cause moderate canal stenosis. There is
moderate left, and mild right, neuroforaminal narrowing.

L5-S1: Posterior disc bulge and mild degenerative disease and ligamentum
flavum buckling causes at least mild canal stenosis. There is mild bilateral
neuroforaminal narrowing.

Small calcification in the right renal hilum is likely vascular.
IMPRESSION: Multilevel degenerative disc and facet disease with multilevel moderate
canal stenosis as described above. There is multilevel bilateral neural
foraminal narrowing, as described above.

## 2013-03-24 ENCOUNTER — Emergency Department: Payer: Self-pay | Admitting: Emergency Medicine

## 2013-03-24 LAB — COMPREHENSIVE METABOLIC PANEL
ALT: 31 U/L (ref 12–78)
AST: 26 U/L (ref 15–37)
Albumin: 3.6 g/dL (ref 3.4–5.0)
Alkaline Phosphatase: 96 U/L
Anion Gap: 7 (ref 7–16)
BILIRUBIN TOTAL: 0.2 mg/dL (ref 0.2–1.0)
BUN: 17 mg/dL (ref 7–18)
CALCIUM: 8.4 mg/dL — AB (ref 8.5–10.1)
CHLORIDE: 101 mmol/L (ref 98–107)
CO2: 26 mmol/L (ref 21–32)
Creatinine: 1 mg/dL (ref 0.60–1.30)
GFR CALC NON AF AMER: 58 — AB
Glucose: 196 mg/dL — ABNORMAL HIGH (ref 65–99)
Osmolality: 275 (ref 275–301)
Potassium: 3.6 mmol/L (ref 3.5–5.1)
SODIUM: 134 mmol/L — AB (ref 136–145)
Total Protein: 7 g/dL (ref 6.4–8.2)

## 2013-03-24 LAB — TROPONIN I
Troponin-I: <0.02 ng/mL
Troponin-I: <0.02 ng/mL

## 2013-03-24 LAB — URINALYSIS, COMPLETE
BLOOD: NEGATIVE
Bilirubin,UR: NEGATIVE
Glucose,UR: 50 mg/dL (ref 0–75)
Hyaline Cast: 24
Ketone: NEGATIVE
Leukocyte Esterase: NEGATIVE
Nitrite: NEGATIVE
Ph: 5 (ref 4.5–8.0)
Protein: NEGATIVE
RBC,UR: NONE SEEN /HPF (ref 0–5)
SPECIFIC GRAVITY: 1.009 (ref 1.003–1.030)
Squamous Epithelial: <1

## 2013-03-24 LAB — CBC
HCT: 37.3 % (ref 35.0–47.0)
HGB: 12.6 g/dL (ref 12.0–16.0)
MCH: 30.4 pg (ref 26.0–34.0)
MCHC: 33.7 g/dL (ref 32.0–36.0)
MCV: 90 fL (ref 80–100)
Platelet: 284 10*3/uL (ref 150–440)
RBC: 4.13 10*6/uL (ref 3.80–5.20)
RDW: 15.1 % — ABNORMAL HIGH (ref 11.5–14.5)
WBC: 11.2 10*3/uL — AB (ref 3.6–11.0)

## 2013-05-12 DIAGNOSIS — Z794 Long term (current) use of insulin: Secondary | ICD-10-CM | POA: Insufficient documentation

## 2013-05-12 DIAGNOSIS — E119 Type 2 diabetes mellitus without complications: Secondary | ICD-10-CM | POA: Insufficient documentation

## 2013-05-14 ENCOUNTER — Ambulatory Visit: Payer: Self-pay | Admitting: Pain Medicine

## 2013-05-15 ENCOUNTER — Ambulatory Visit: Payer: Self-pay | Admitting: Pain Medicine

## 2013-06-18 IMAGING — MG MAM DGTL SCRN MAM NO ORDER W/CAD
1 series · 4 of 4 positions shown · non-contrast
Comparison: none

REASON FOR EXAM: scr mammo no order
COMMENTS:

PROCEDURE:     MAM - MAM DGTL SCRN MAM NO ORDER W/CAD  - July 10, 2012  [DATE]
RESULT:     COMPARISON:  04/06/2009 [REDACTED] outpatient [REDACTED]
TECHNIQUE: Digital screening mammograms were obtained. FDA approved
computer-aided detection (CAD) for mammography was utilized for this study.
BREAST COMPOSITION: The breast composition is HETEROGENEOUSLY DENSE
(glandular tissue is 51-75%). This may decrease the sensitivity of
mammography.

[R CC · right · 4 of 4 slices shown]
[im 1/4]
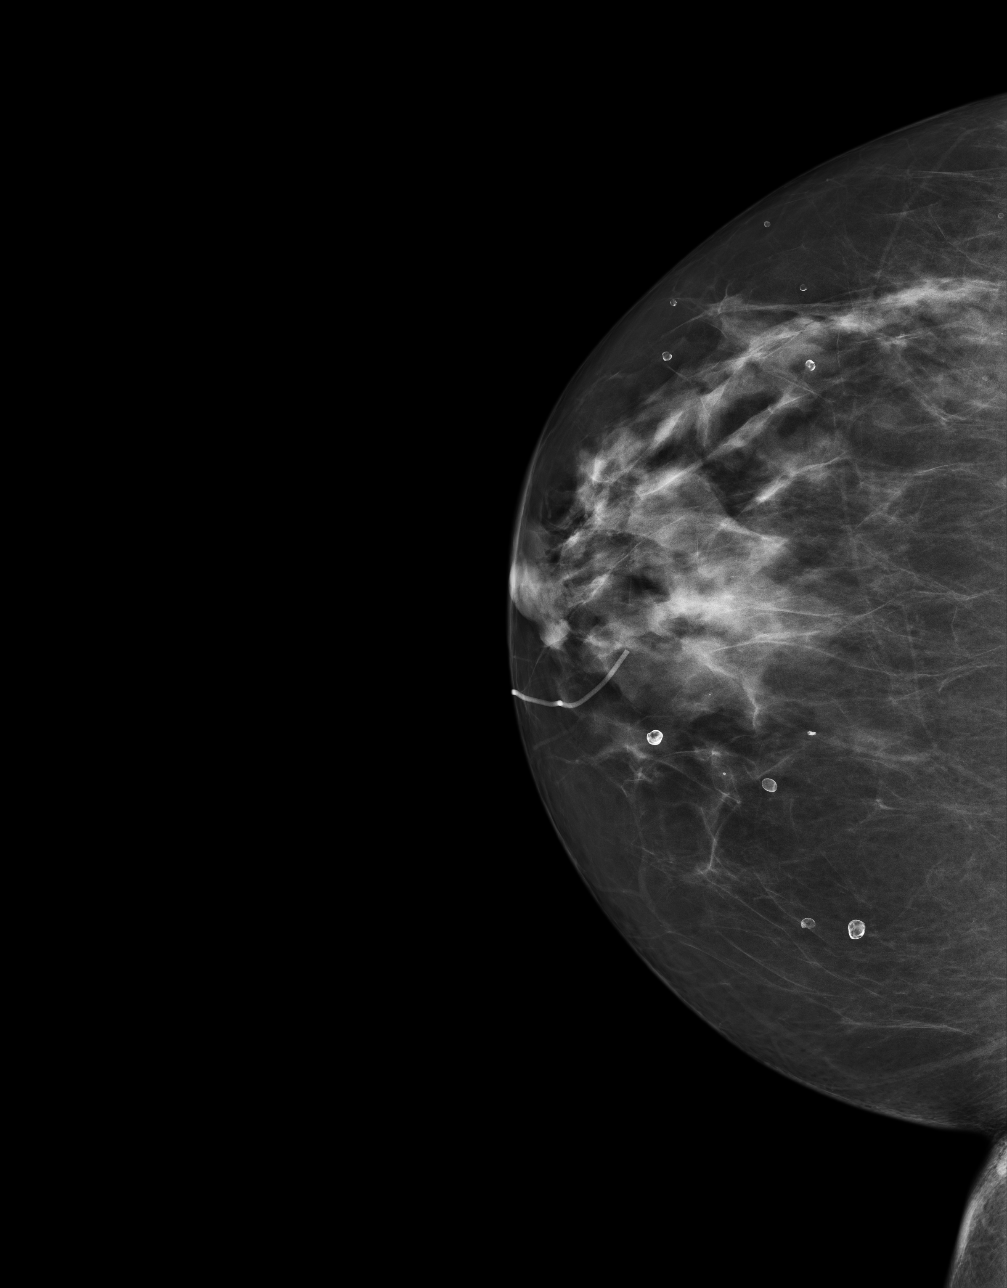
[im 2/4]
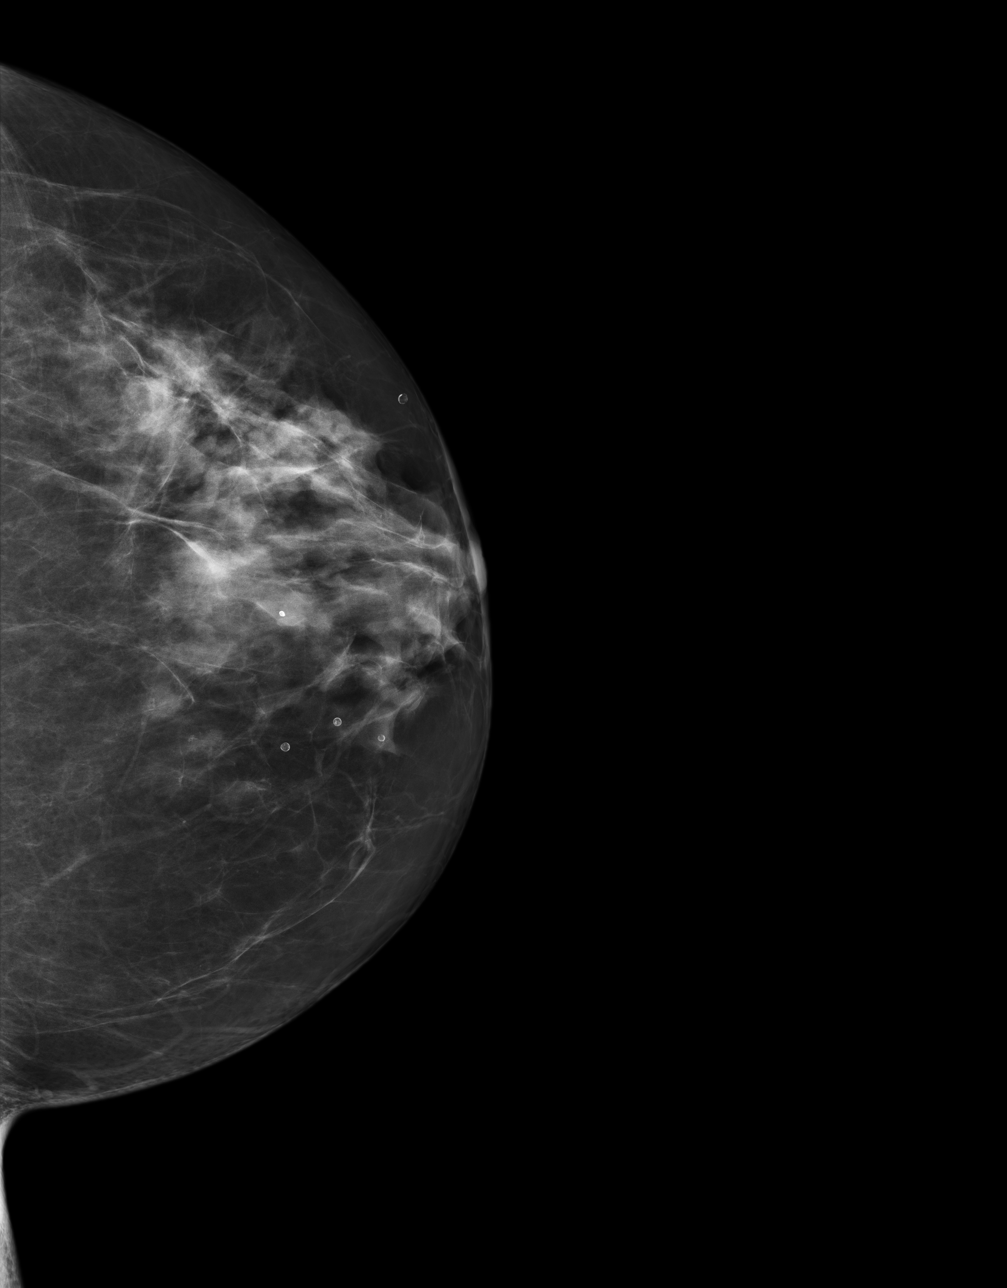
[im 3/4]
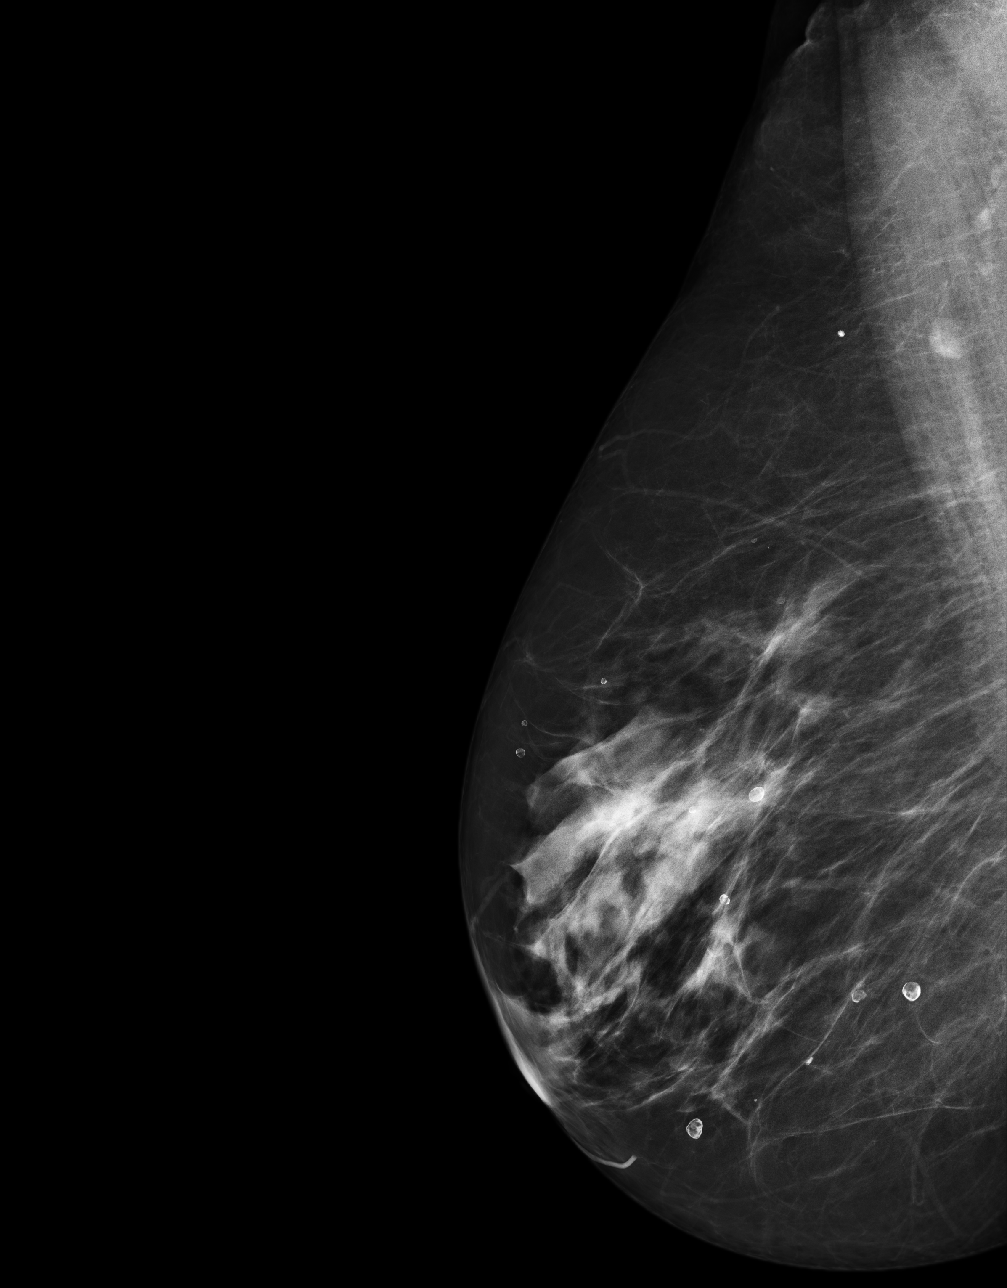
[im 4/4]
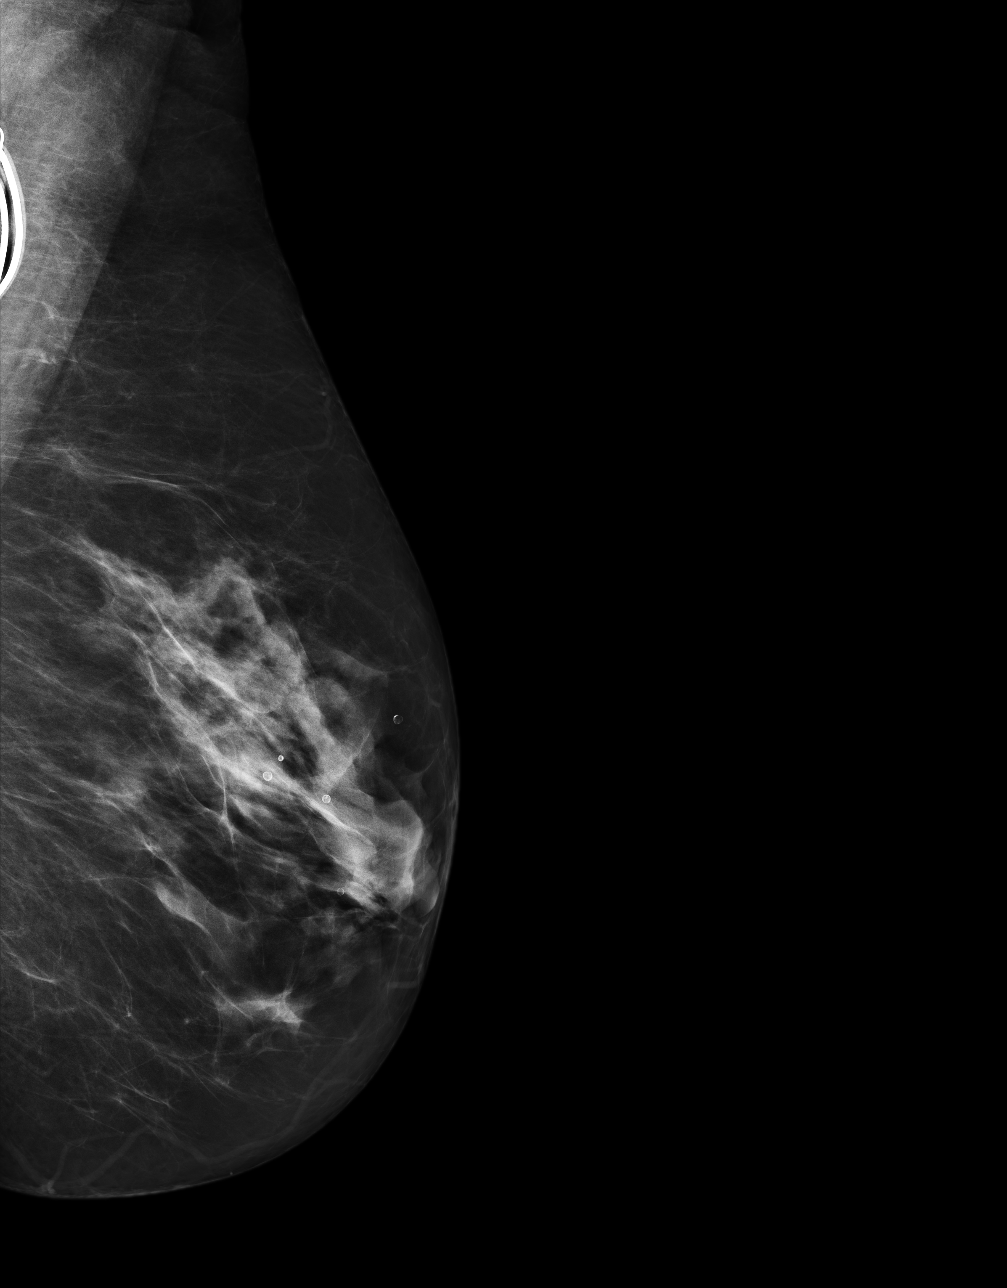

[4 of 4 positions shown; findings below may reference images not displayed]

FINDING: There is no dominant mass, architectural distortion or clusters of
suspicious microcalcifications.
IMPRESSION: 1.     Stable bilateral mammogram.
2.     Annual mammographic follow up recommended.

BI-RADS:  Category 2- Benign.

A negative mammogram report does not preclude biopsy or other evaluation of
a clinically palpable or otherwise suspicious mass or lesion. Breast cancer
may not be detected by mammography in up to 10% of cases.

[REDACTED]

## 2013-06-20 ENCOUNTER — Other Ambulatory Visit: Payer: Self-pay | Admitting: Pain Medicine

## 2013-06-20 LAB — BASIC METABOLIC PANEL
Anion Gap: 1 — ABNORMAL LOW (ref 7–16)
BUN: 22 mg/dL — ABNORMAL HIGH (ref 7–18)
CO2: 33 mmol/L — AB (ref 21–32)
CREATININE: 0.72 mg/dL (ref 0.60–1.30)
Calcium, Total: 9.1 mg/dL (ref 8.5–10.1)
Chloride: 102 mmol/L (ref 98–107)
EGFR (African American): >60
Glucose: 115 mg/dL — ABNORMAL HIGH (ref 65–99)
Osmolality: 276 (ref 275–301)
POTASSIUM: 4.7 mmol/L (ref 3.5–5.1)
Sodium: 136 mmol/L (ref 136–145)

## 2013-06-20 LAB — HEPATIC FUNCTION PANEL A (ARMC)
ALBUMIN: 3.7 g/dL (ref 3.4–5.0)
ALK PHOS: 107 U/L
BILIRUBIN TOTAL: 0.2 mg/dL (ref 0.2–1.0)
Bilirubin, Direct: <0.1 mg/dL (ref 0.00–0.20)
SGOT(AST): 26 U/L (ref 15–37)
SGPT (ALT): 46 U/L (ref 12–78)
Total Protein: 7.4 g/dL (ref 6.4–8.2)

## 2013-06-20 LAB — MAGNESIUM: Magnesium: 1.7 mg/dL — ABNORMAL LOW

## 2013-06-25 ENCOUNTER — Ambulatory Visit: Payer: Self-pay | Admitting: Pain Medicine

## 2013-07-03 ENCOUNTER — Ambulatory Visit: Payer: Self-pay | Admitting: Pain Medicine

## 2013-07-24 ENCOUNTER — Ambulatory Visit: Payer: Self-pay | Admitting: Pain Medicine

## 2013-09-15 ENCOUNTER — Ambulatory Visit: Payer: Self-pay | Admitting: Family Medicine

## 2013-09-22 IMAGING — US US EXTREM LOW VENOUS BILAT
1 series · 14 of 24 positions shown · non-contrast
Comparison: none

REASON FOR EXAM: CALL REPORT [DATE] Bilateral pain and swelling Eval
for DVT
COMMENTS:

[Series 1: us extrem low venous bilat · 0.09mm/px · 14 of 45 slices shown]
[im 1/45]
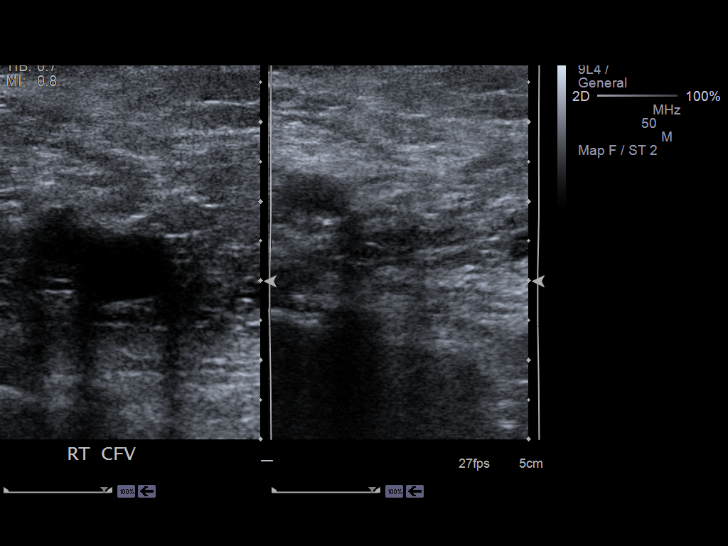
[im 4/45]
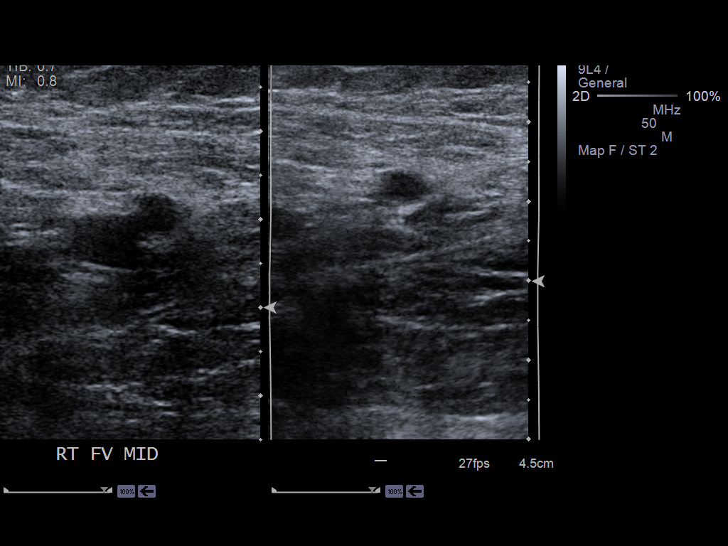
[im 8/45]
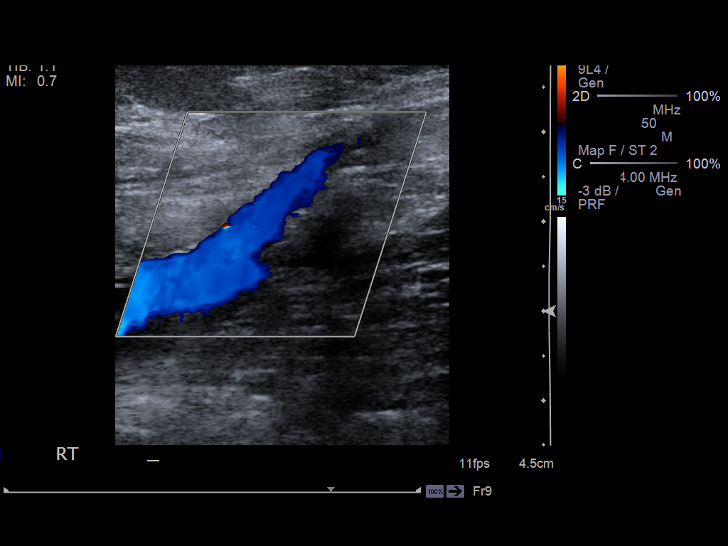
[im 12/45]
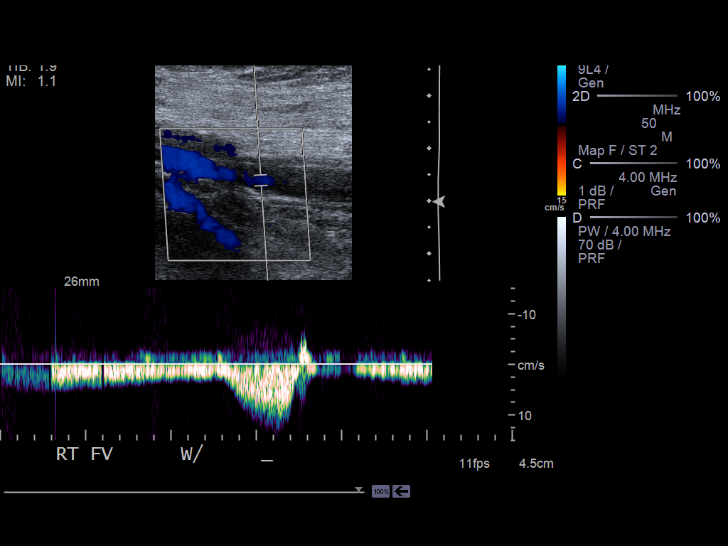
[im 14/45]
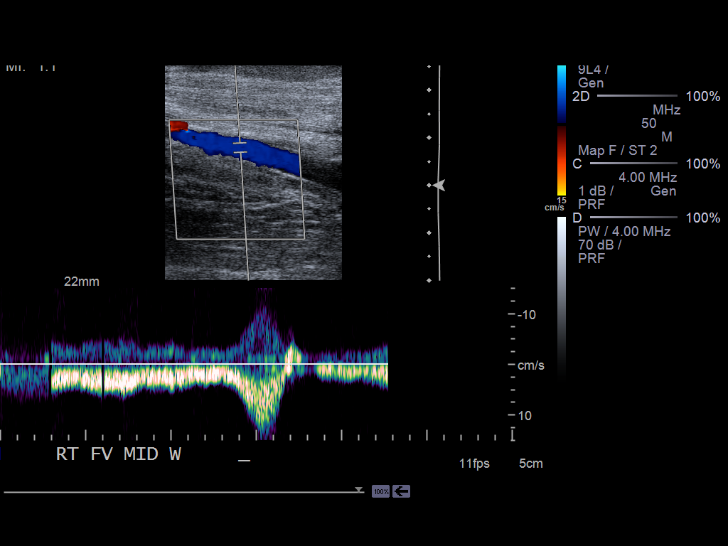
[im 18/45]
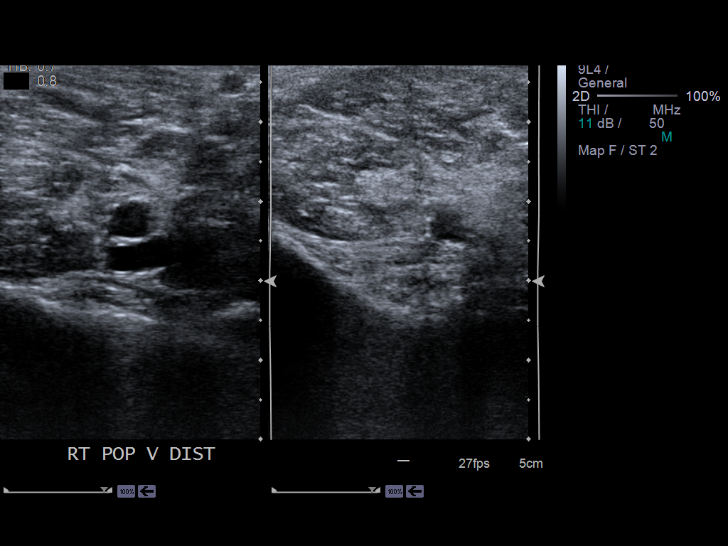
[im 22/45]
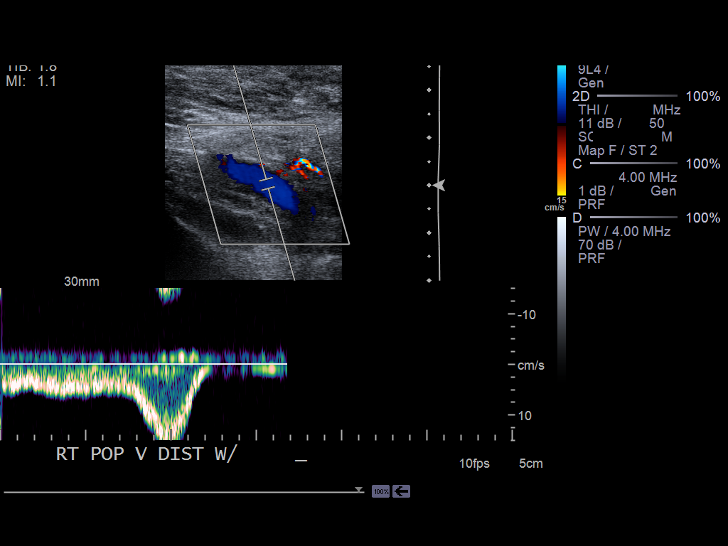
[im 23/45]
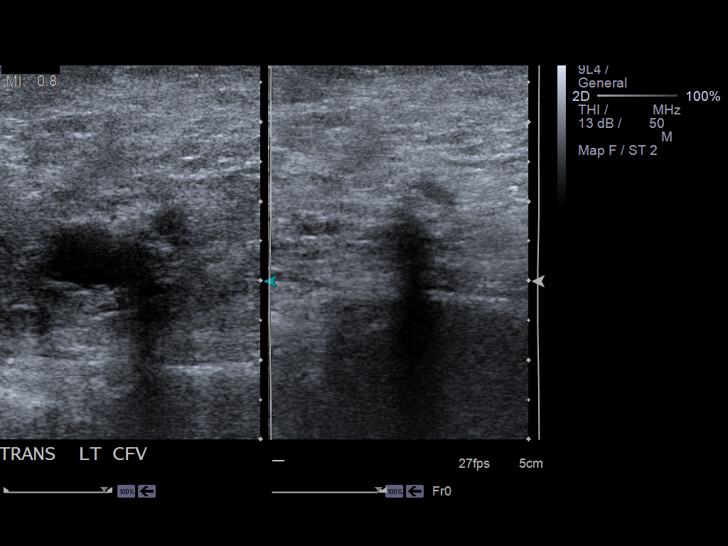
[im 27/45]
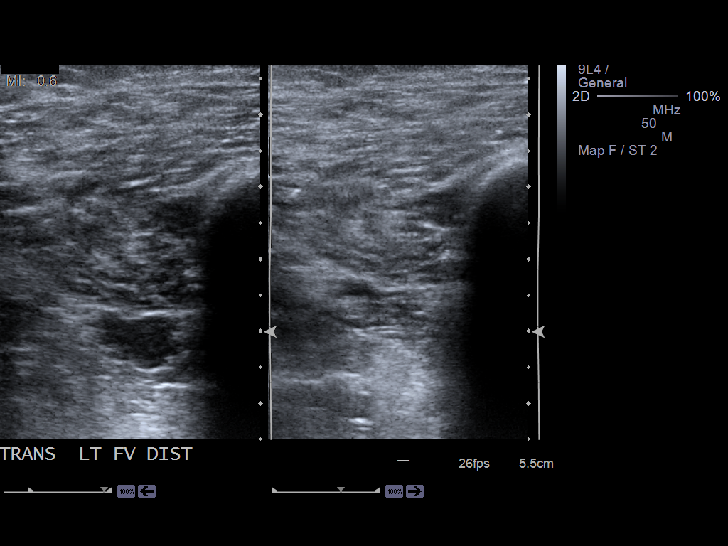
[im 31/45]
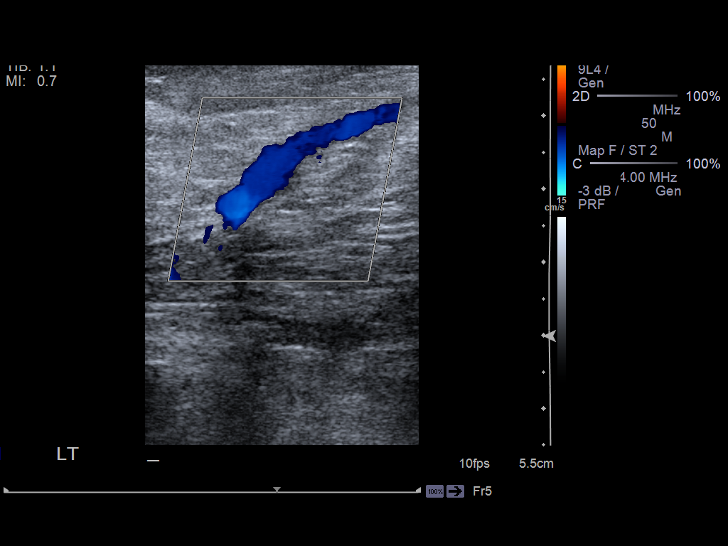
[im 35/45]
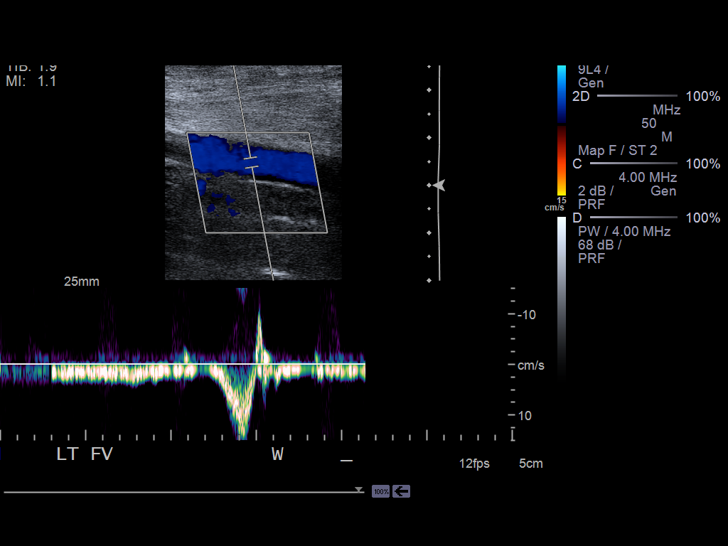
[im 37/45]
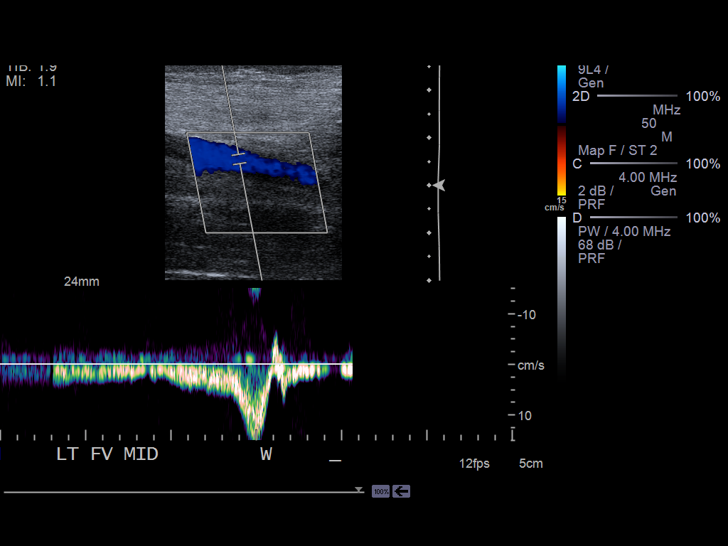
[im 41/45]
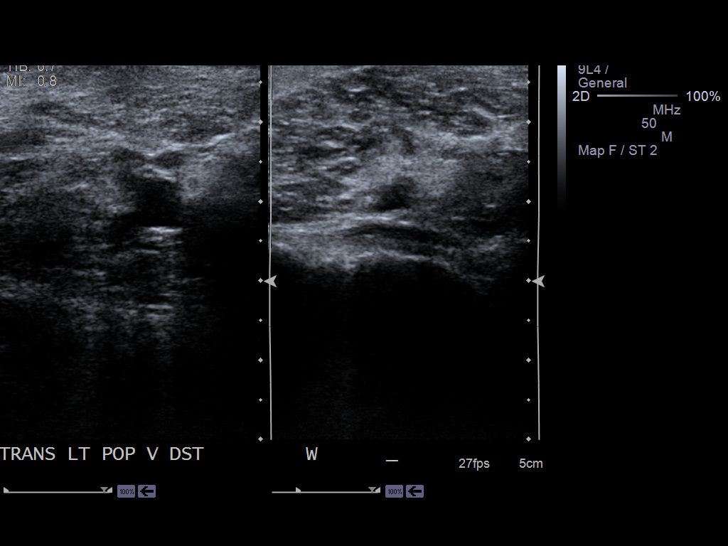
[im 45/45]
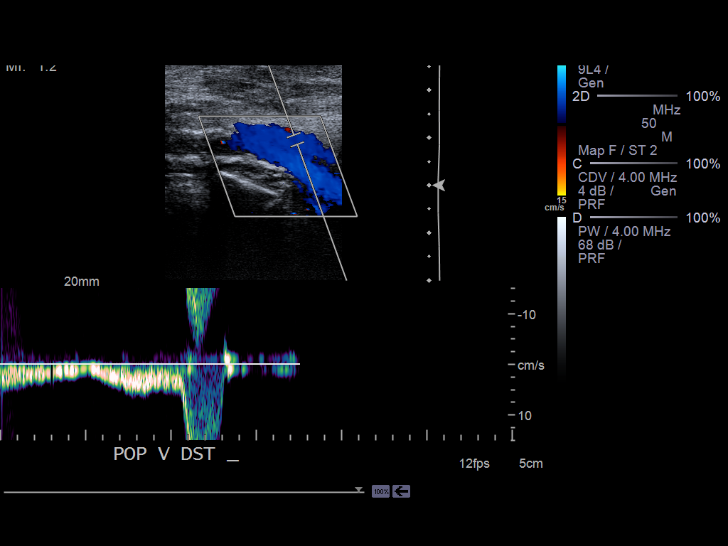

[14 of 24 positions shown; findings below may reference images not displayed]

PROCEDURE:     YAMOTO - YAMOTO DOPPLER VEINS BIL LEG EXTR  - October 14, 2012 [DATE]

RESULT:     Duplex Doppler interrogation of the deep venous system of both
legs from the inguinal to the popliteal region demonstrates the deep venous
systems are fully compressible throughout. The color Doppler and spectral
Doppler appearance is normal. There is normal response to distal
augmentation. The color Doppler images show no filling defect.
IMPRESSION: 1. No evidence of DVT in either lower extremity.

[REDACTED]

## 2014-02-08 ENCOUNTER — Emergency Department: Payer: Self-pay | Admitting: Student

## 2014-04-22 IMAGING — CR SACRUM AND COCCYX - 2+ VIEW
1 series · 3 of 3 positions shown · non-contrast
Comparison: none

[Series 1: ap · 0.17mm/px · 3 of 3 slices shown]
[im 1/3]
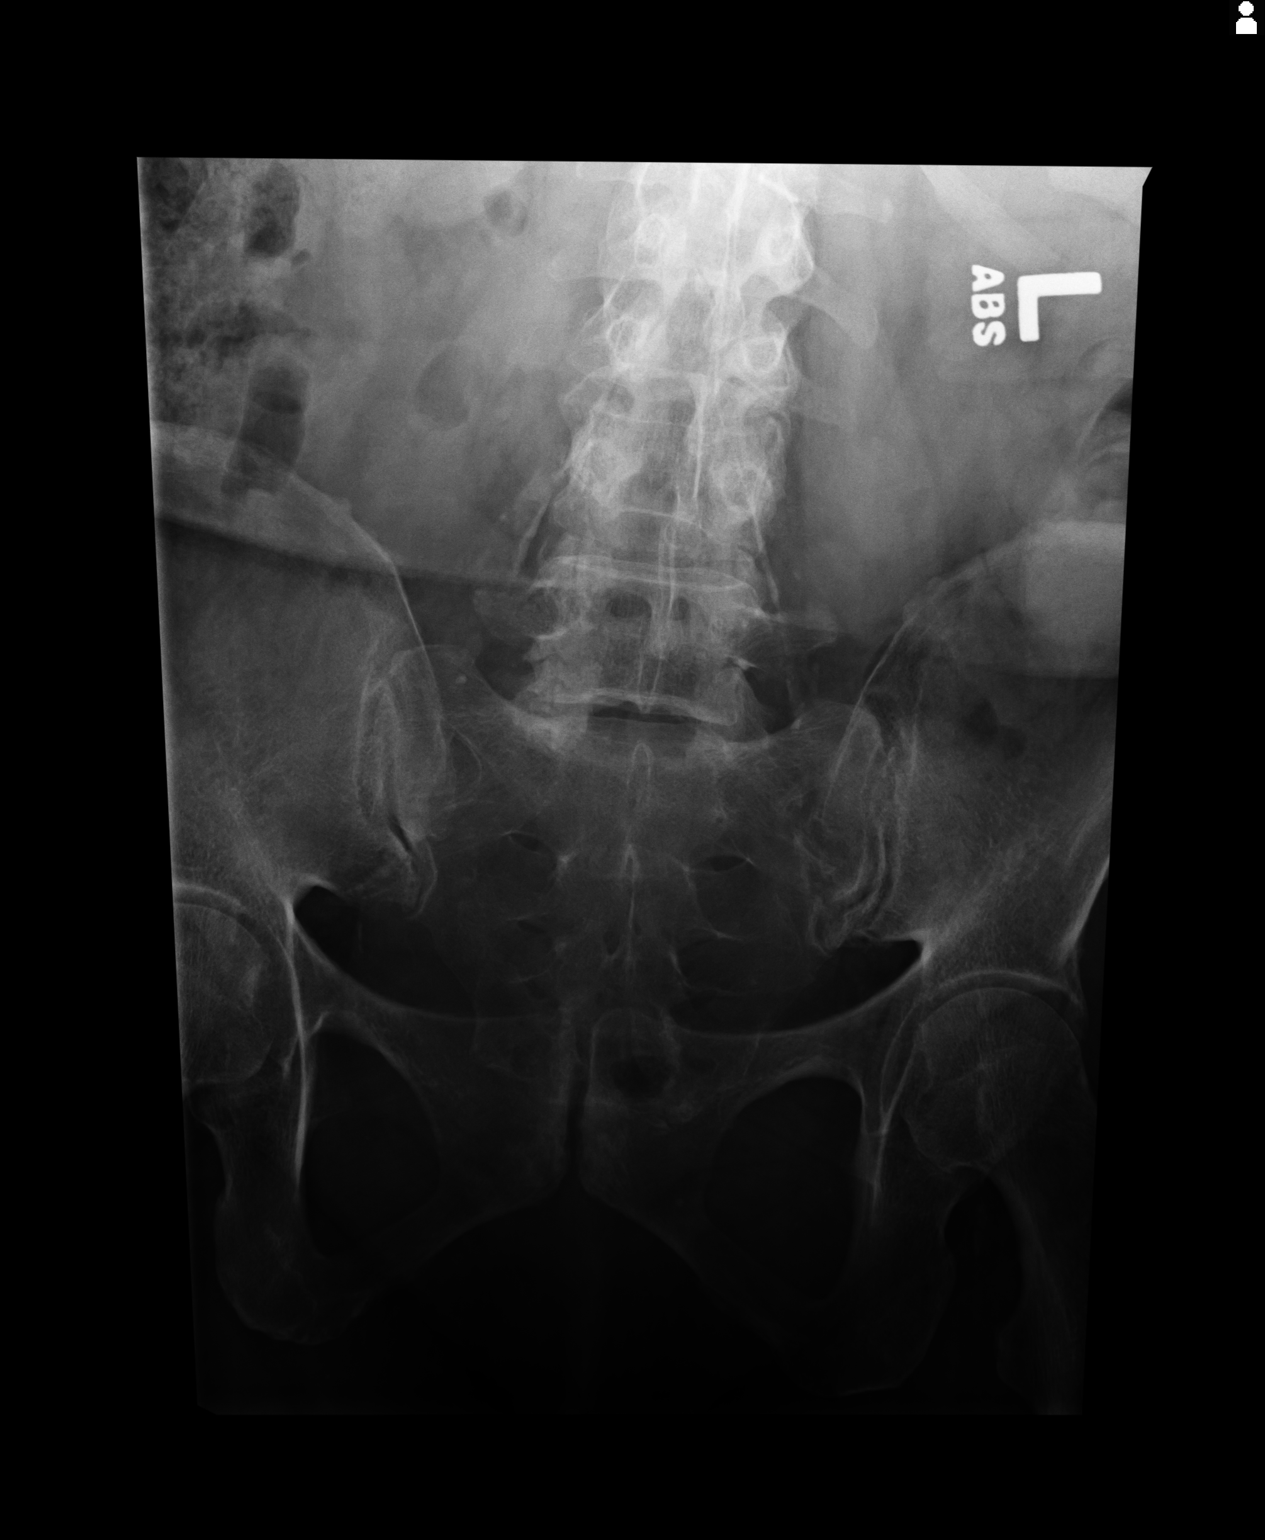
[im 2/3]
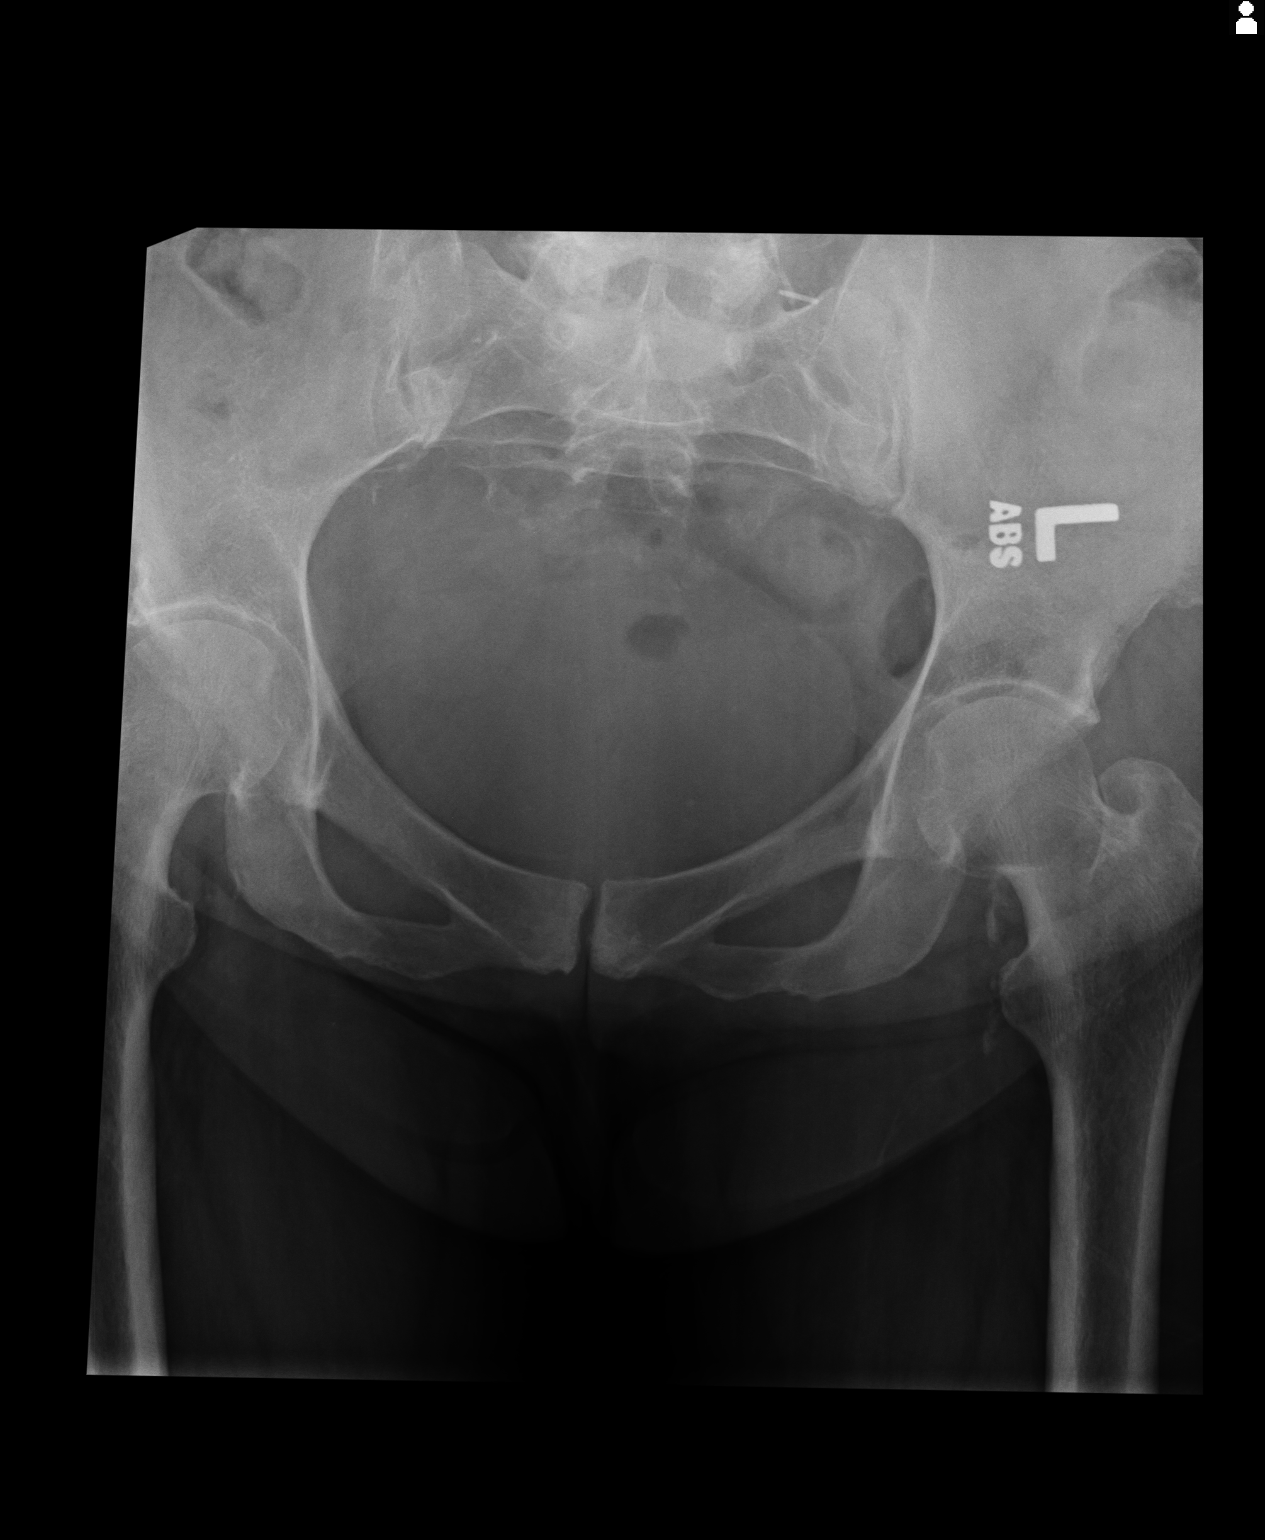
[im 3/3]
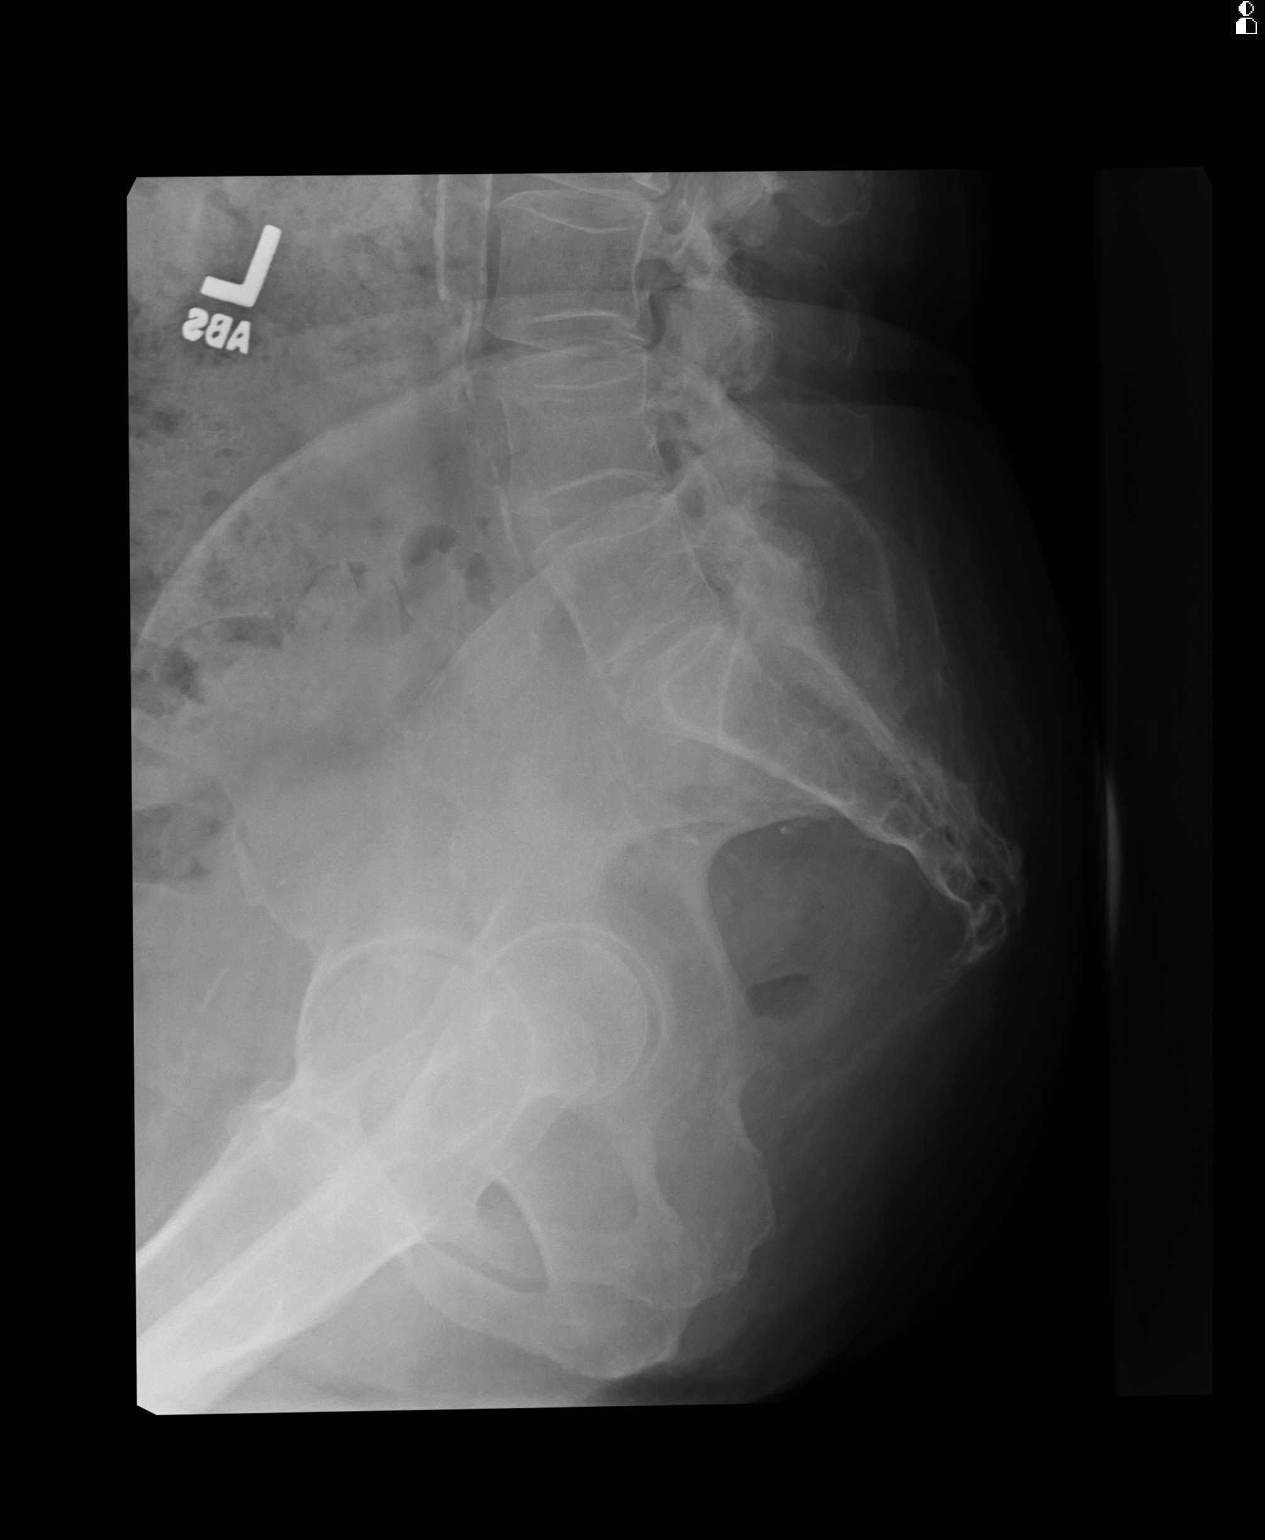

[3 of 3 positions shown; findings below may reference images not displayed]

CLINICAL DATA
Hip pain.

EXAM
SACRUM AND COCCYX - 2+ VIEW

COMPARISON
None.

FINDINGS
Degenerative changes lumbar spine, both SI joints, both hips. No
acute abnormality. Atherosclerotic vascular calcification of the
abdominal aorta and iliac arteries.

IMPRESSION
1. Diffuse degenerative change, lumbar spine, both SI joints, both
hips. No acute abnormality.
2. Aortoiliac atherosclerotic vascular disease.

SIGNATURE

## 2014-04-22 IMAGING — CR BILATERAL SACROILIAC JOINTS - 3+ VIEW
1 series · 3 of 3 positions shown · non-contrast
Comparison: none

[Series 1: ap · 0.17mm/px · 3 of 3 slices shown]
[im 1/3]
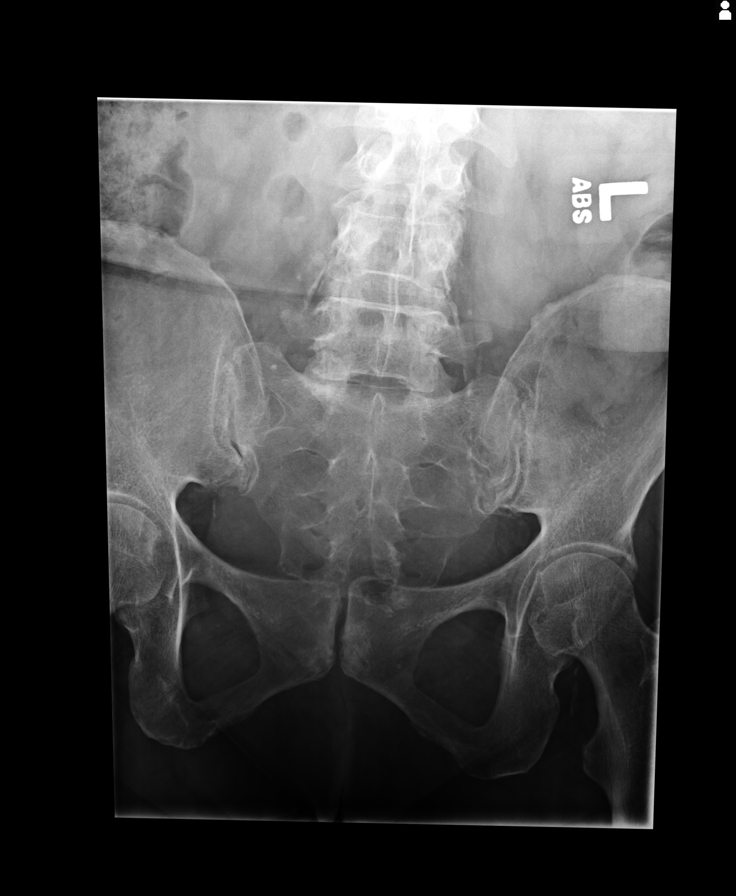
[im 2/3]
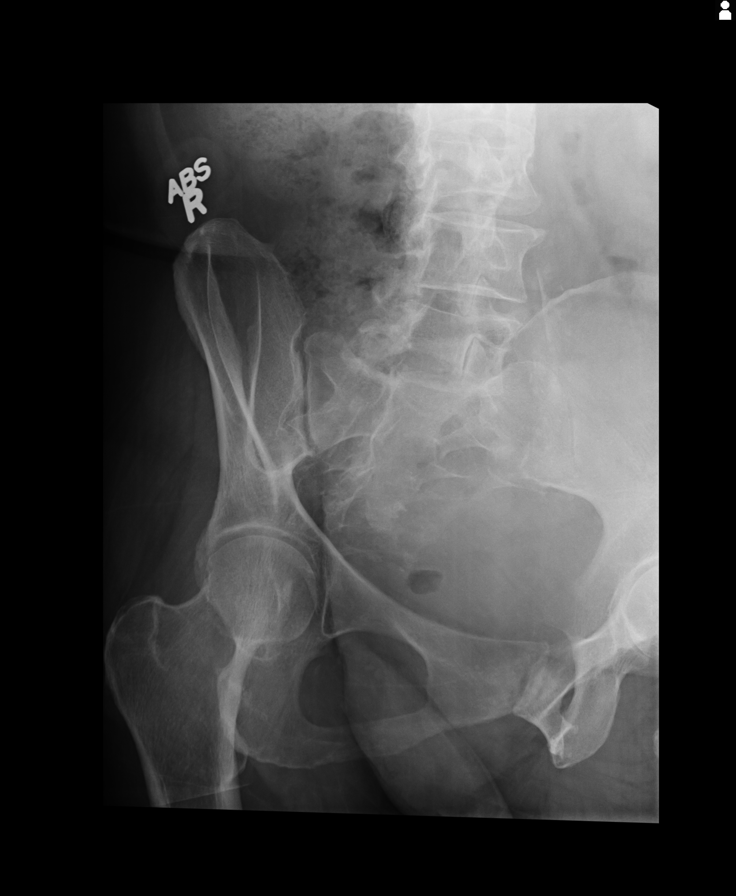
[im 3/3]
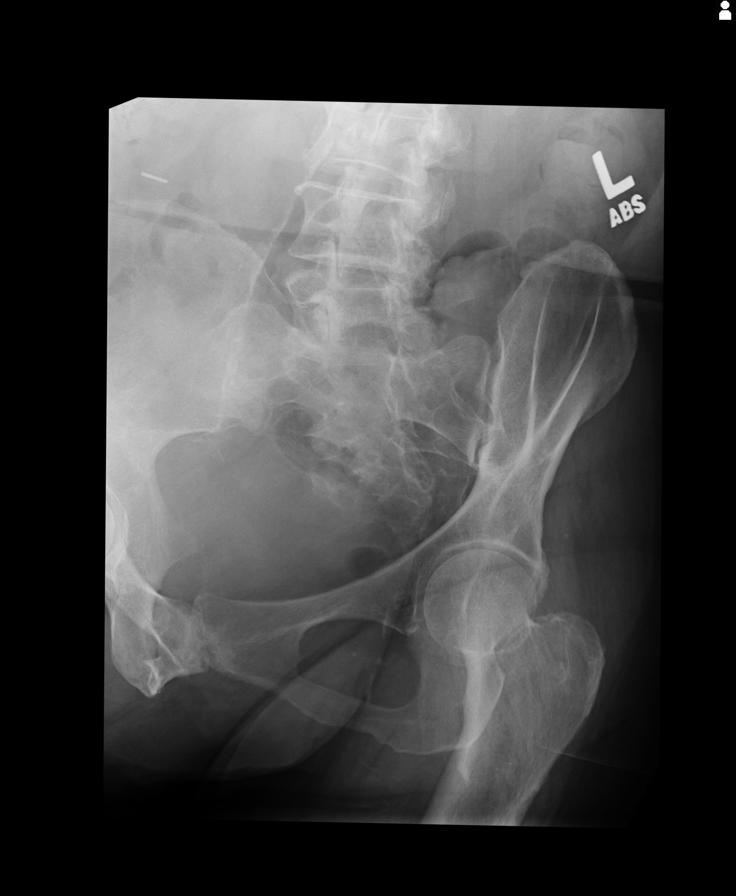

[3 of 3 positions shown; findings below may reference images not displayed]

CLINICAL DATA
Pain.

EXAM
BILATERAL SACROILIAC JOINTS - 3+ VIEW

COMPARISON
None.

FINDINGS
Degenerative changes lumbar spine, both SI joints, both hips. No
acute abnormality. Aortoiliac atherosclerotic vascular disease
present.

IMPRESSION
No acute abnormality. Degenerative changes lumbar spine, both SI
joints, both hips.

SIGNATURE

## 2014-04-22 IMAGING — CR DG HIP COMPLETE 2+V*L*
1 series · 3 of 3 positions shown · non-contrast
Comparison: none

[Series 1: ap · 0.17mm/px · 3 of 3 slices shown]
[im 1/3]
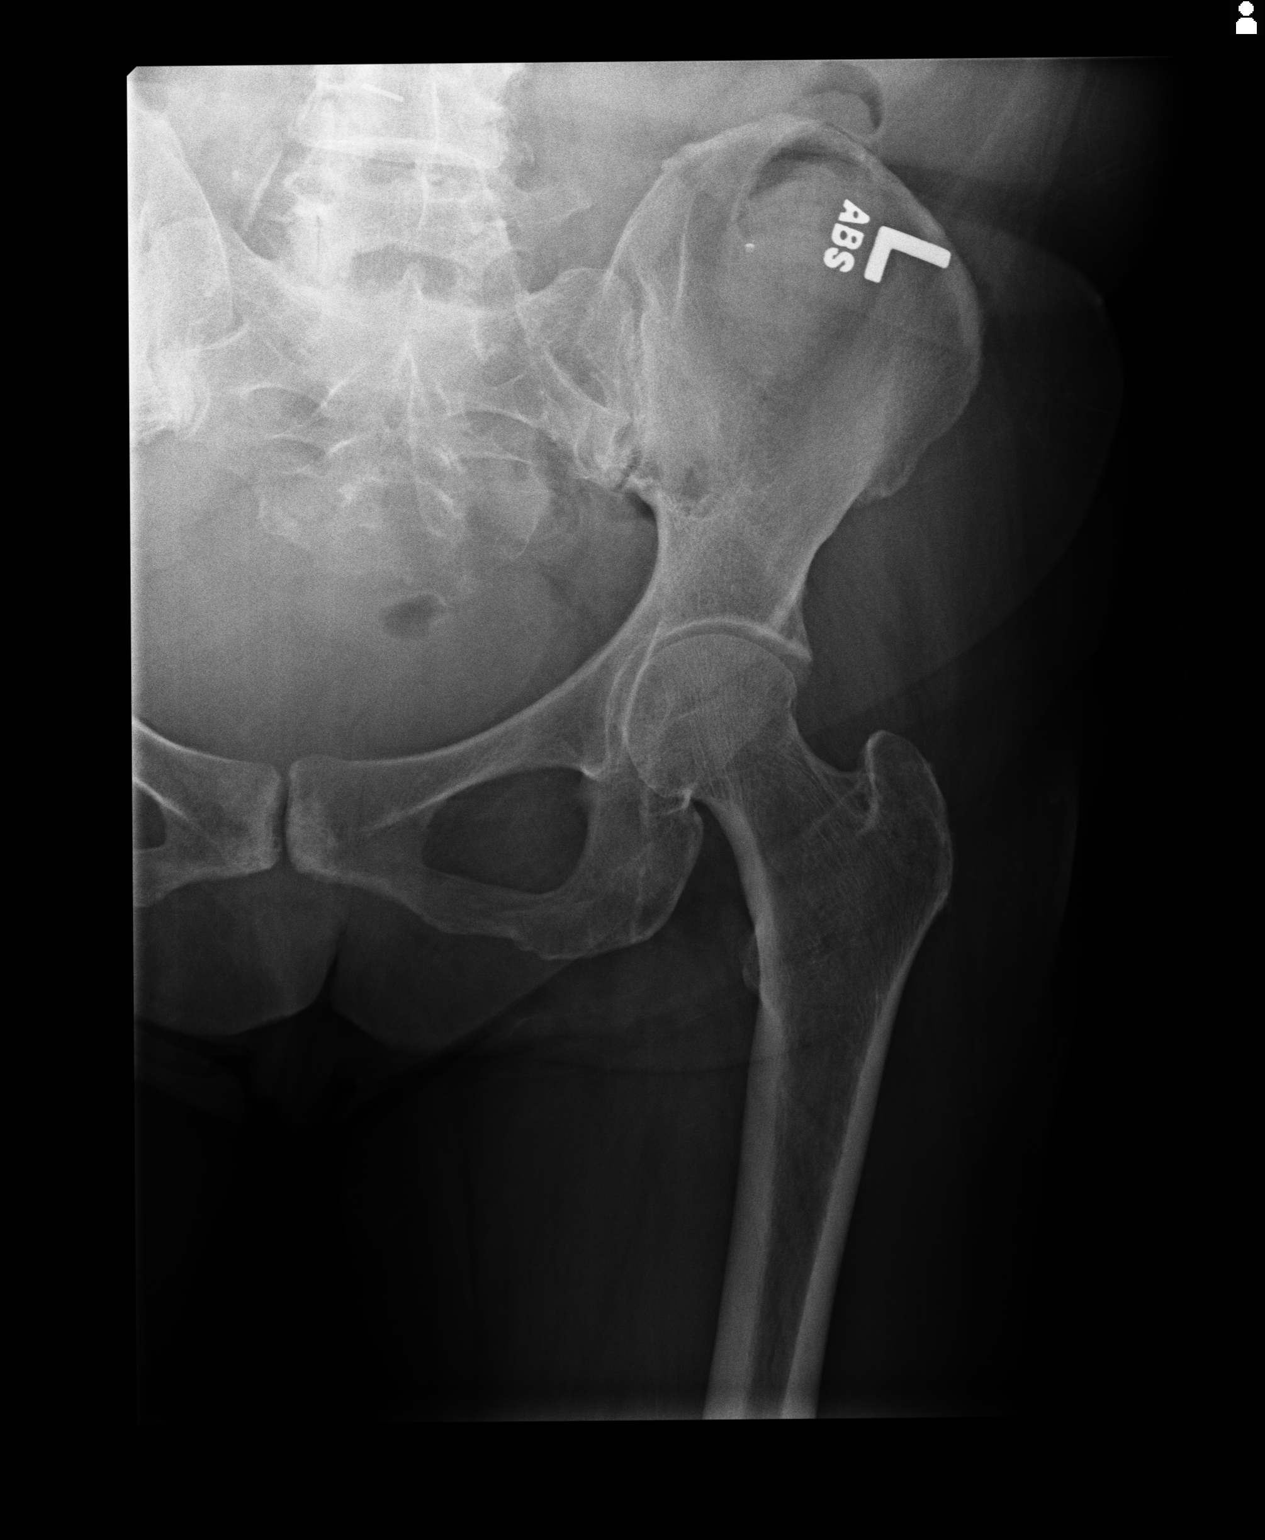
[im 2/3]
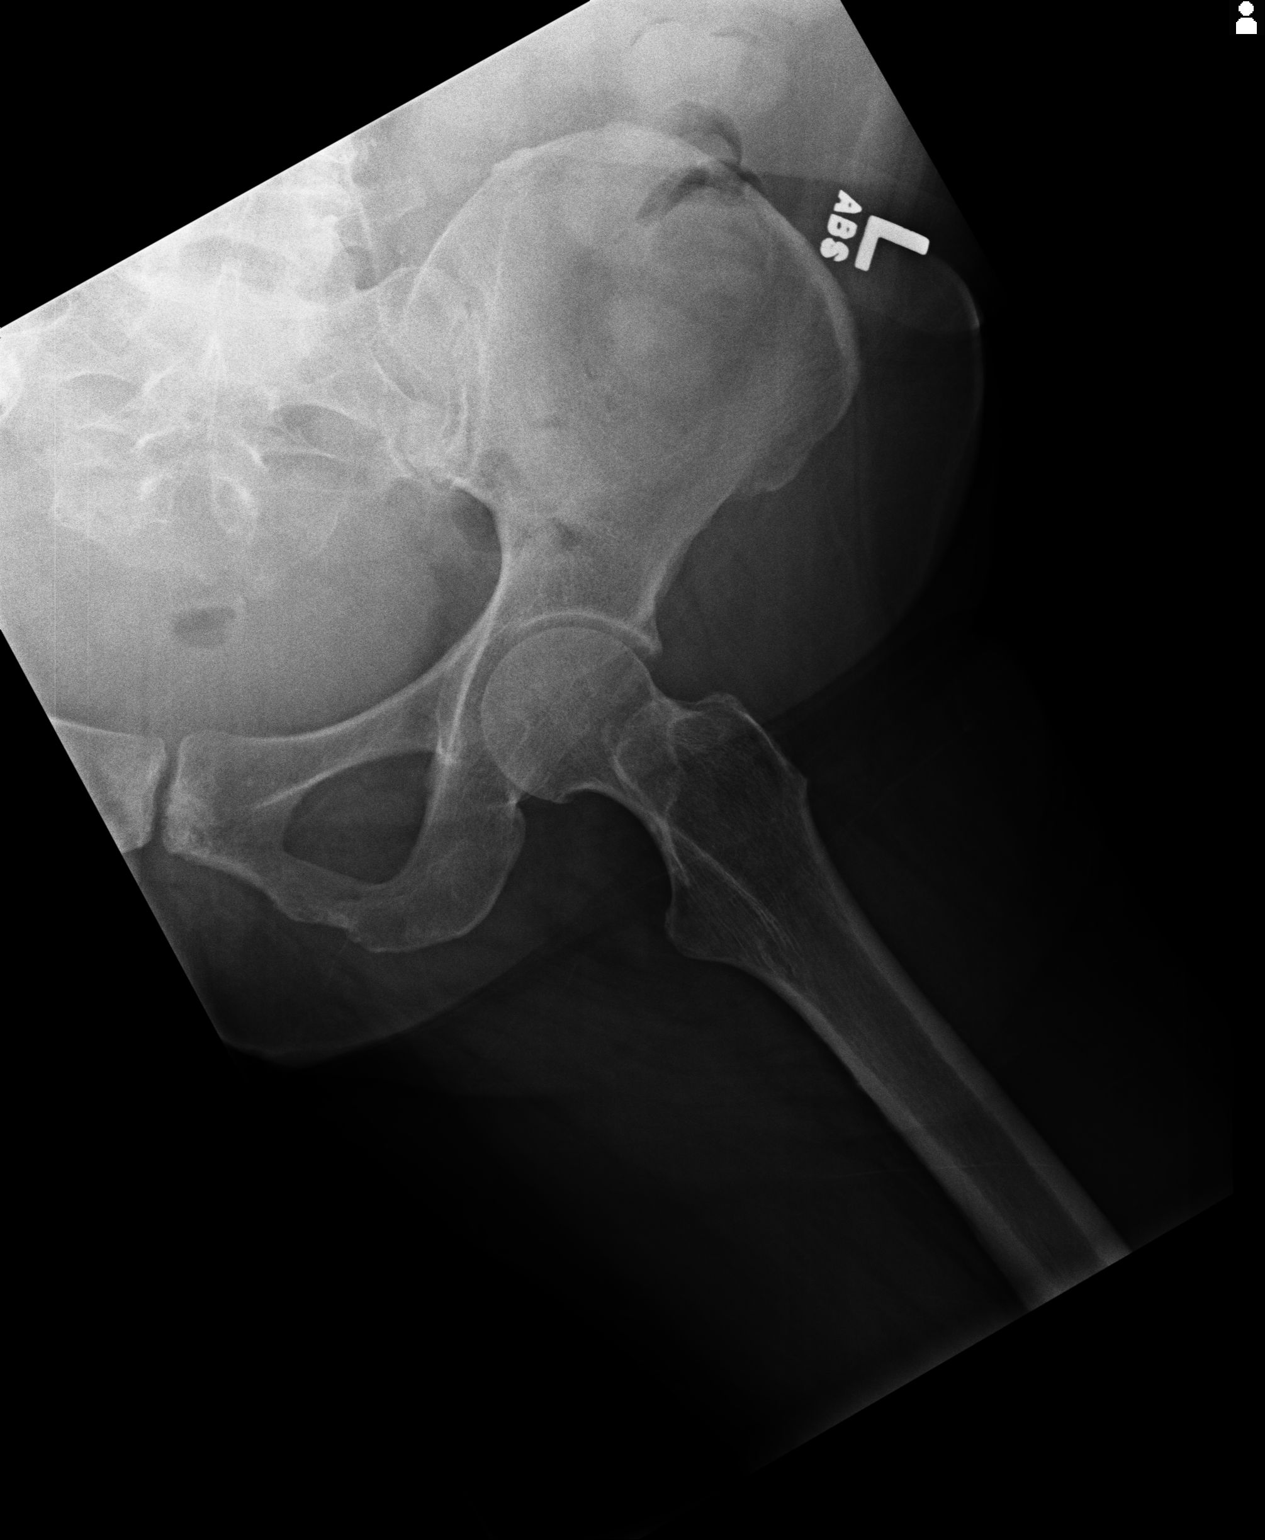
[im 3/3]
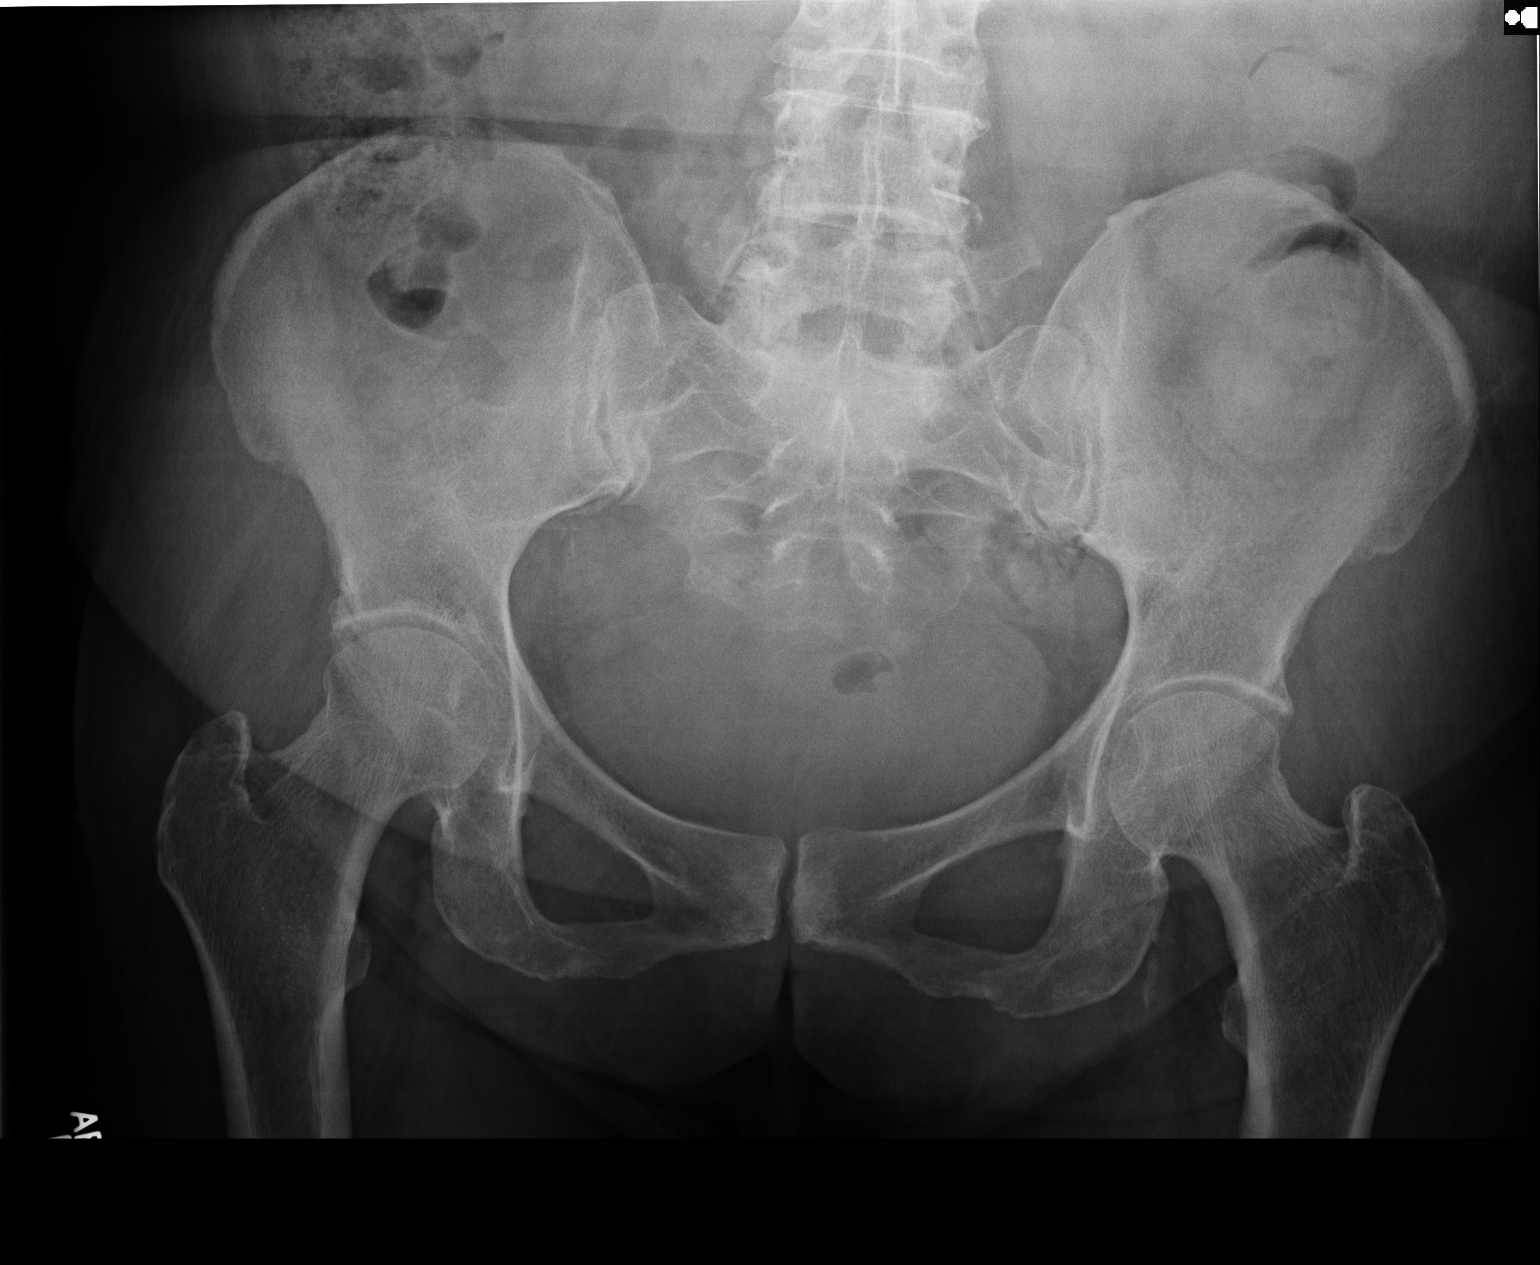

[3 of 3 positions shown; findings below may reference images not displayed]

CLINICAL DATA
Hip pain.

EXAM
LEFT HIP - COMPLETE 2+ VIEW

COMPARISON
None.

FINDINGS
Degenerative changes right hip. No acute abnormality . Degenerative
changes noted of the lumbar spine and both SI joints. Aortoiliac
atherosclerotic vascular disease present .

IMPRESSION
1.  DJD, no acute abnormality.

2.  Peripheral vascular disease.

SIGNATURE

## 2014-04-22 IMAGING — CR DG HIP COMPLETE 2+V*L*
1 series · 2 of 2 positions shown · non-contrast
Comparison: none

[Series 1: ap · 0.17mm/px · 2 of 2 slices shown]
[im 1/2]
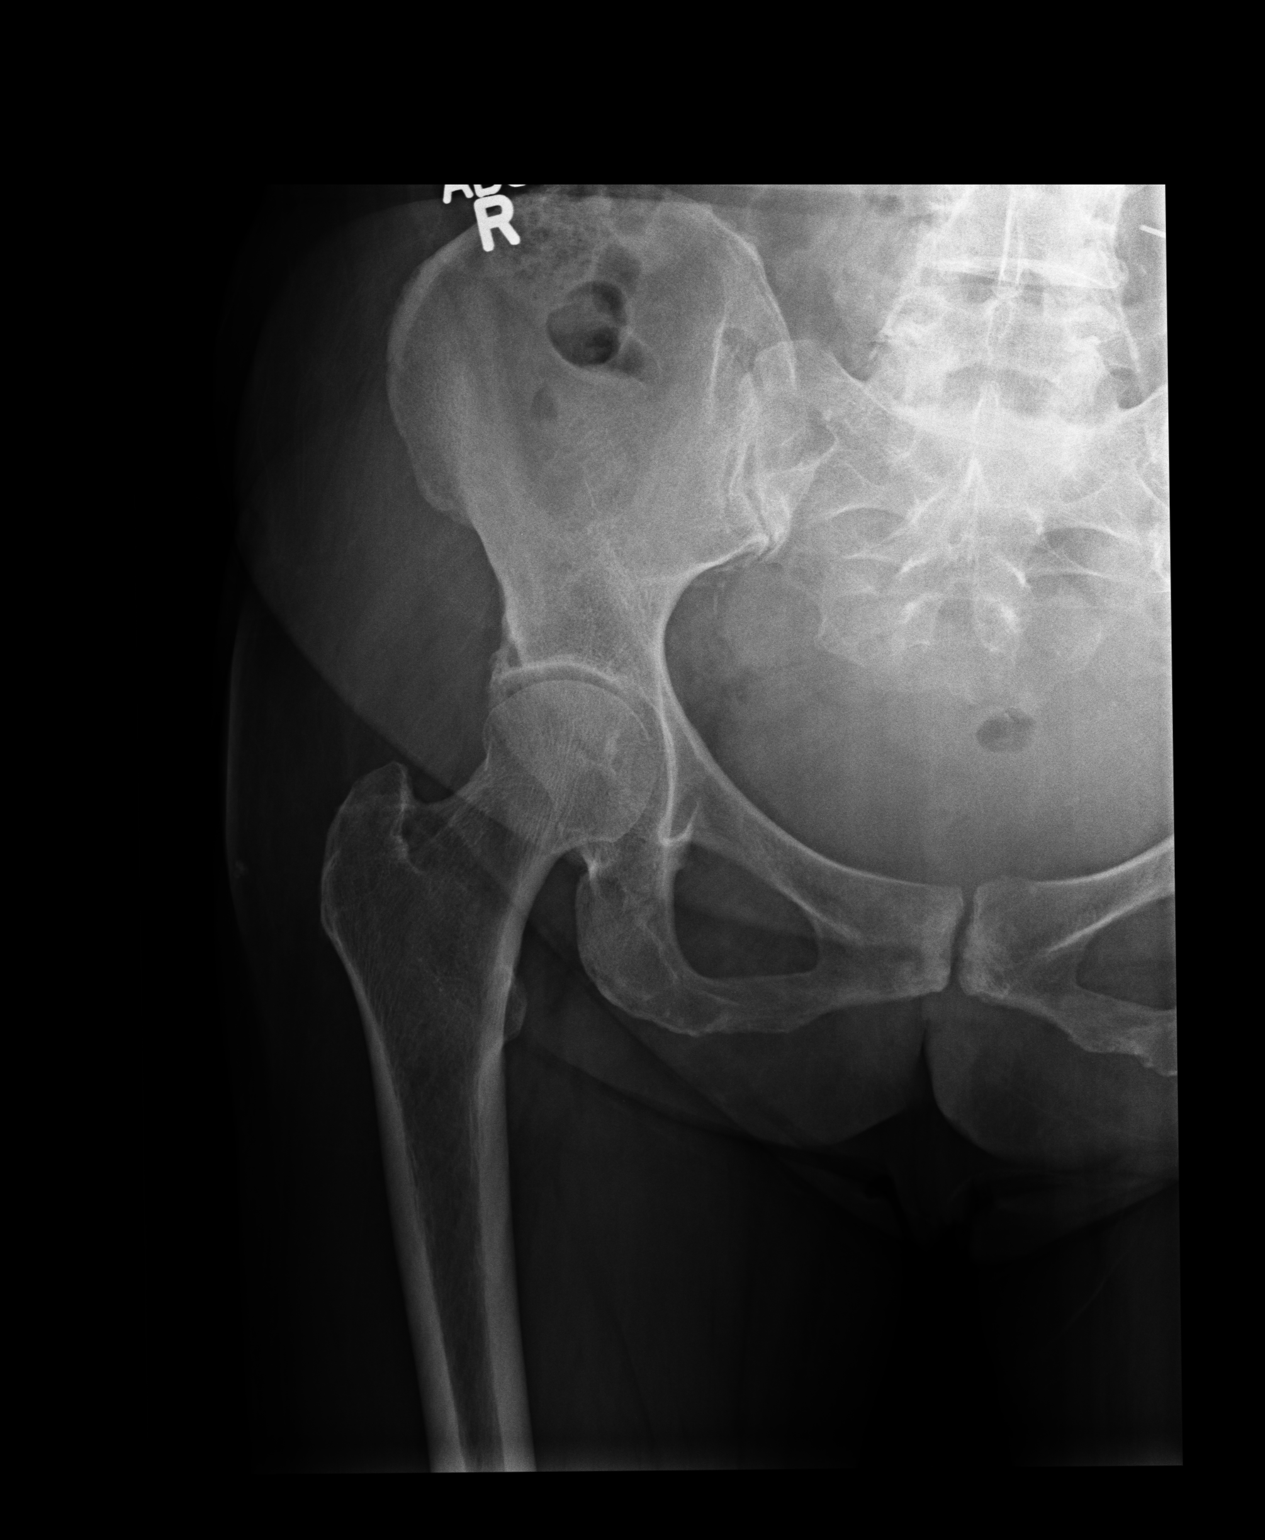
[im 2/2]
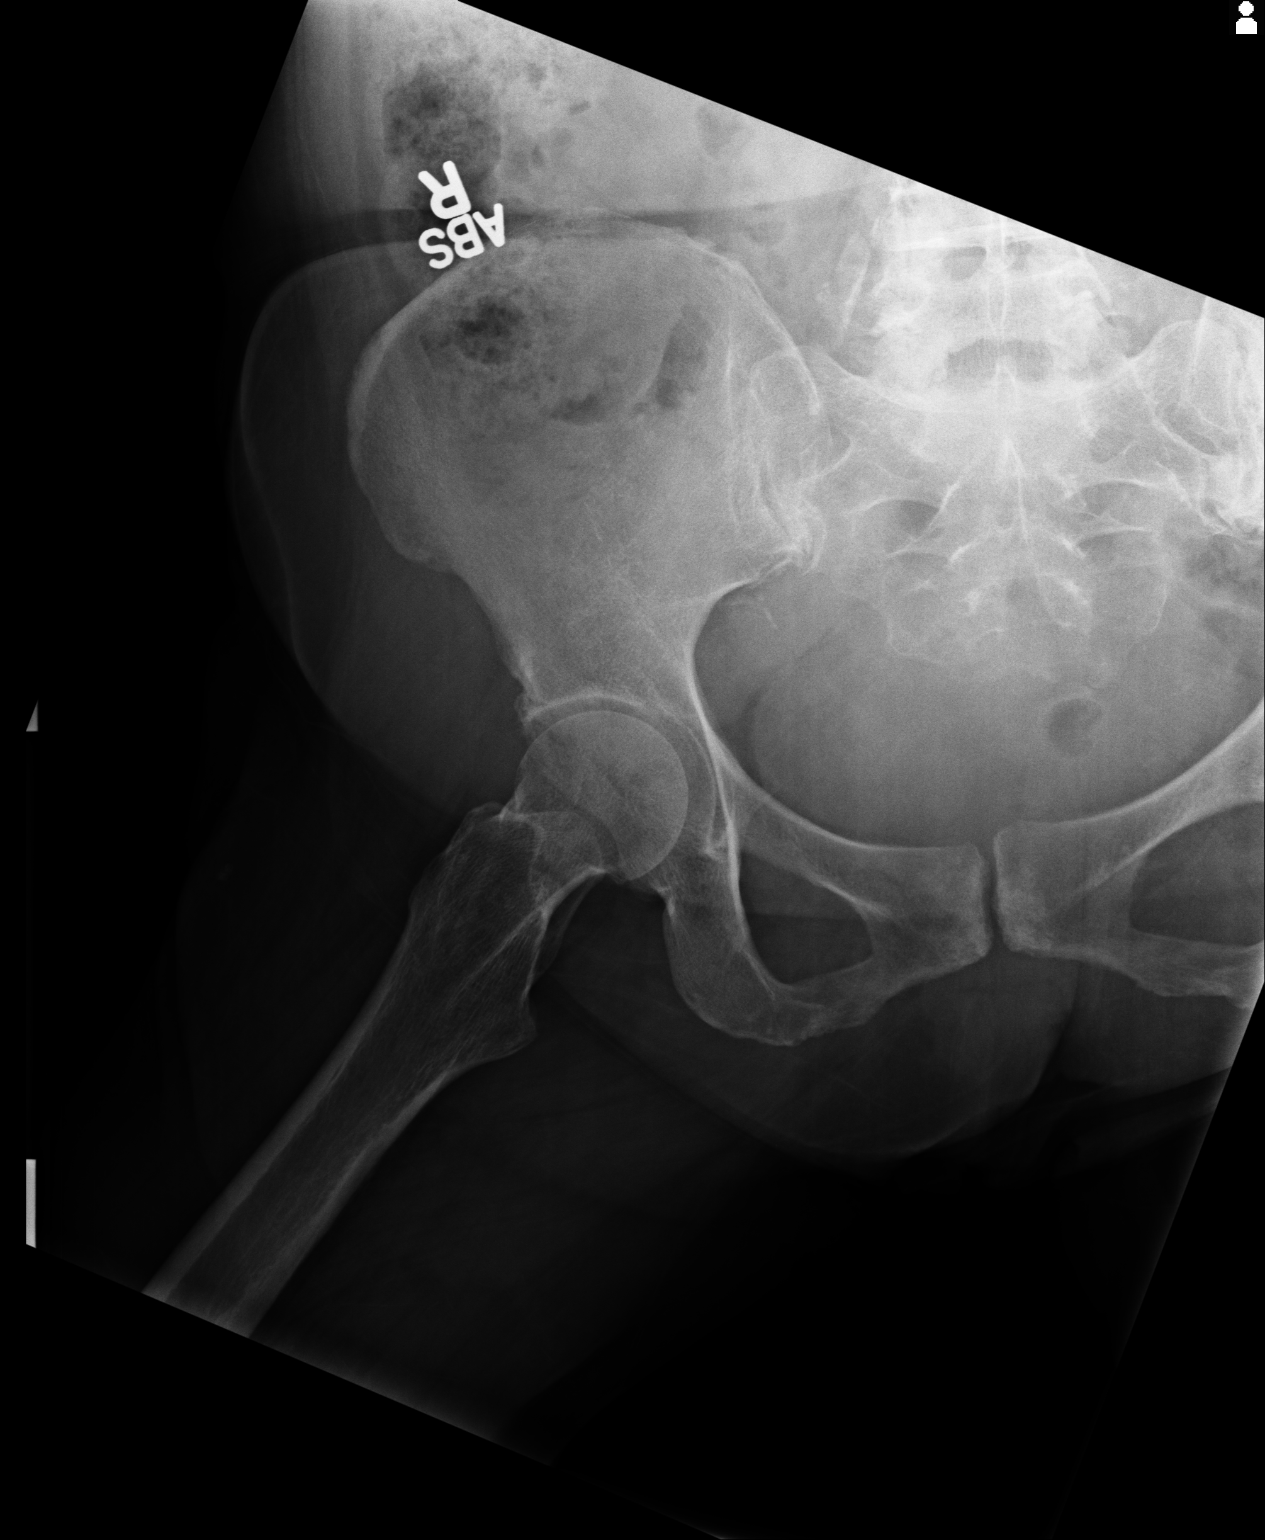

[2 of 2 positions shown; findings below may reference images not displayed]

CLINICAL DATA
Hip pain.

EXAM
LEFT HIP - COMPLETE 2+ VIEW

COMPARISON
None.

FINDINGS
Degenerative changes right hip. No acute abnormality . Degenerative
changes noted of the lumbar spine and both SI joints. Aortoiliac
atherosclerotic vascular disease present .

IMPRESSION
1.  DJD, no acute abnormality.

2.  Peripheral vascular disease.

SIGNATURE

## 2014-08-24 IMAGING — MG MAM DGTL SCRN MAM NO ORDER W/CAD
1 series · 6 of 6 positions shown · non-contrast
Comparison: Previous exam(s).

CLINICAL DATA: Screening.

EXAM:
DIGITAL SCREENING BILATERAL MAMMOGRAM WITH CAD

[R CC · right · 6 of 6 slices shown]
[im 1/6]
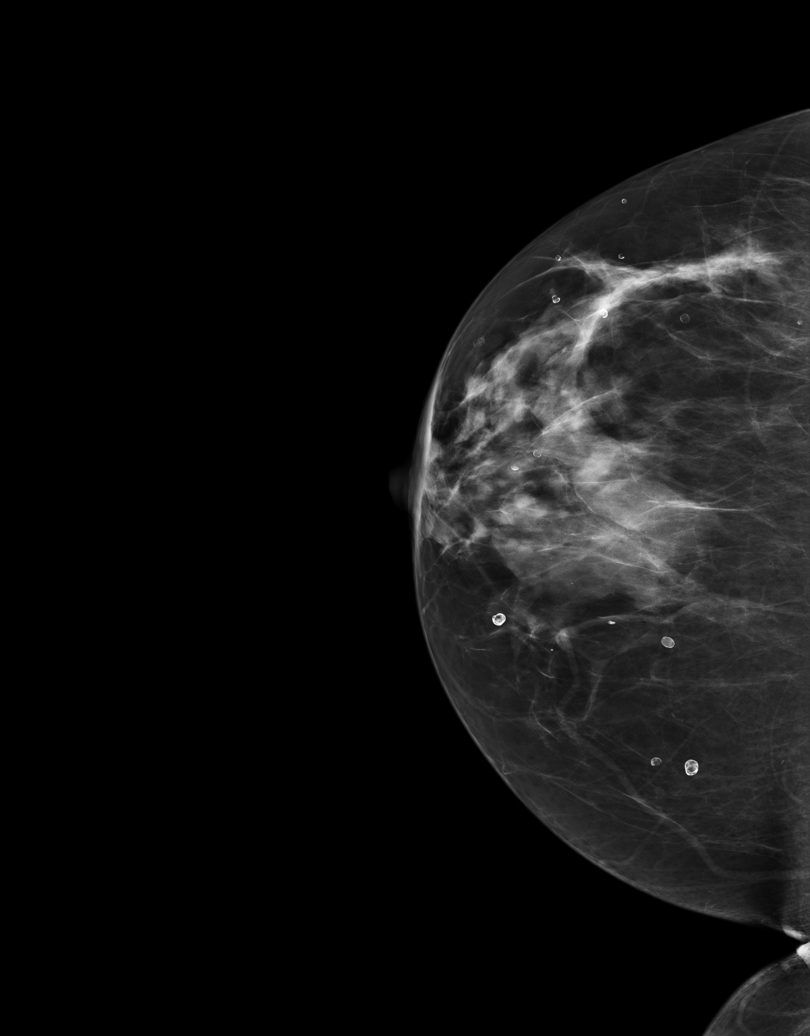
[im 2/6]
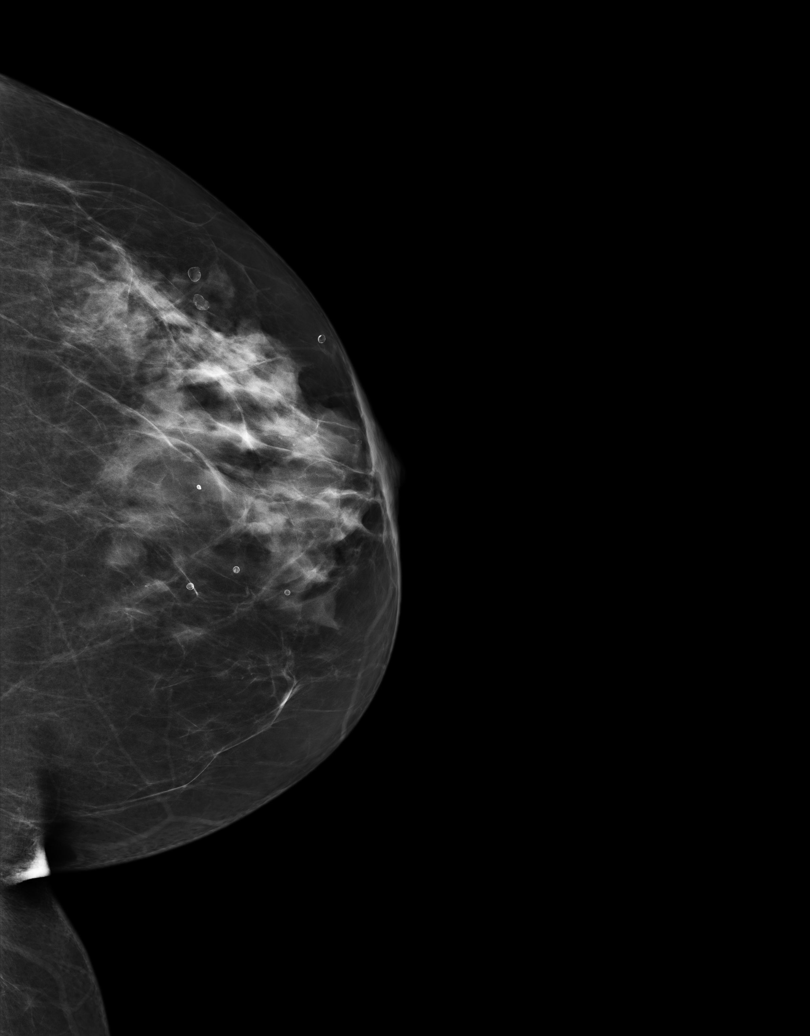
[im 3/6]
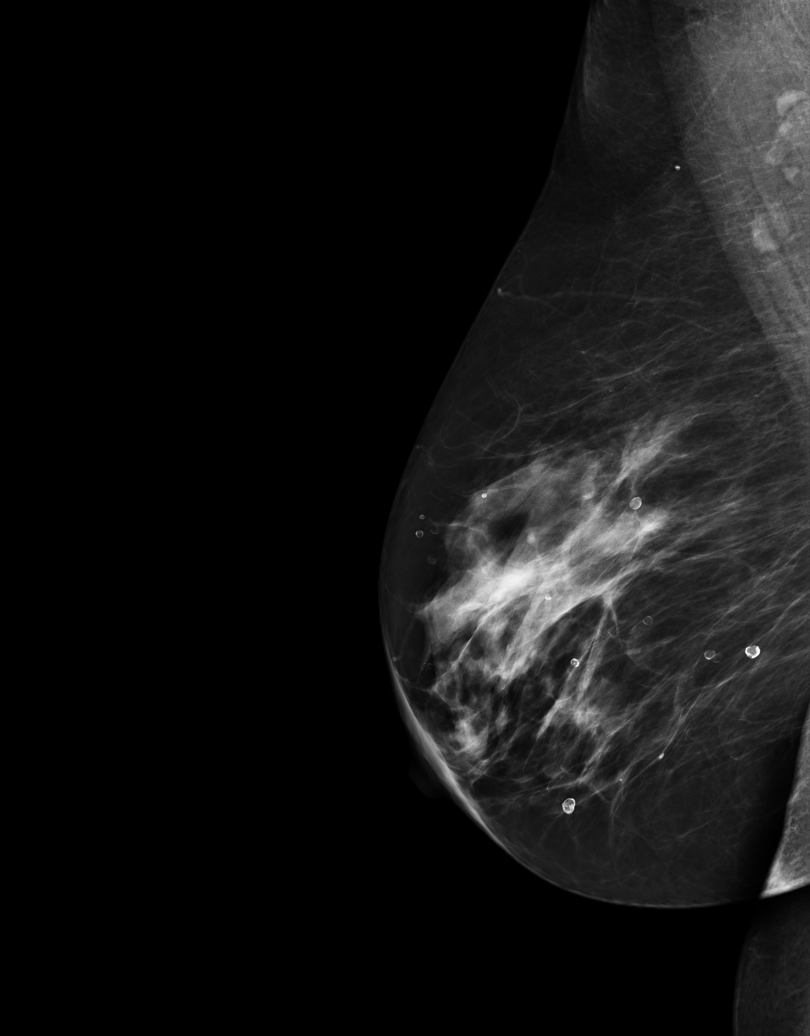
[im 4/6]
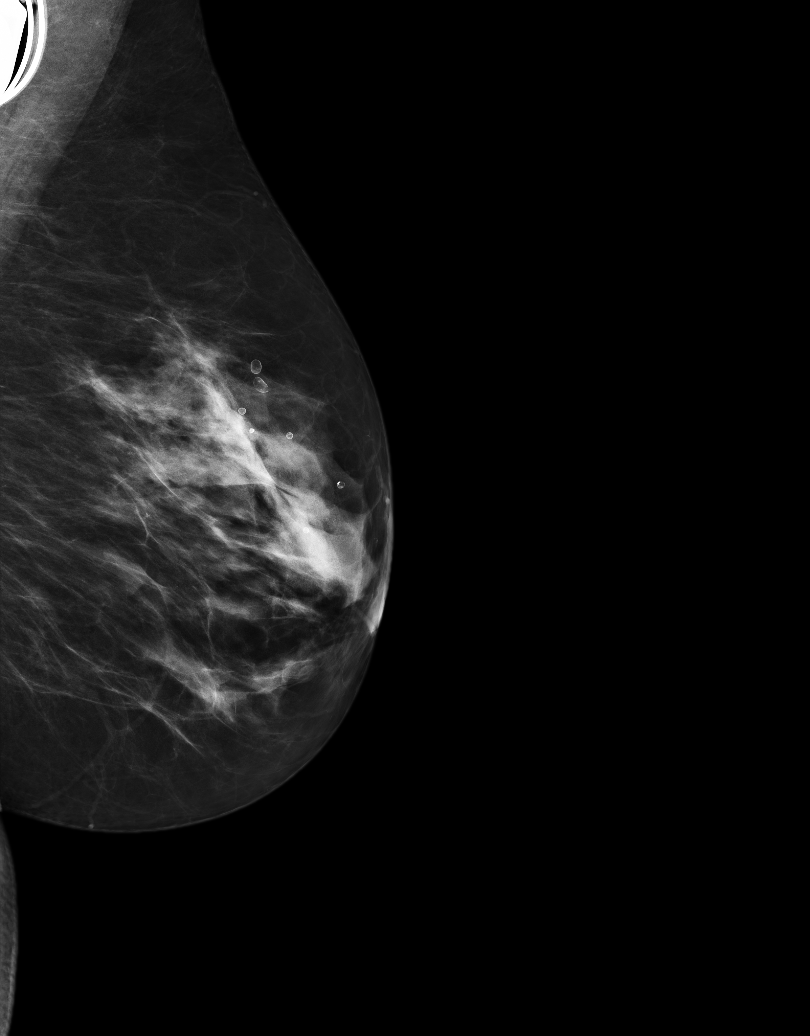
[im 5/6]
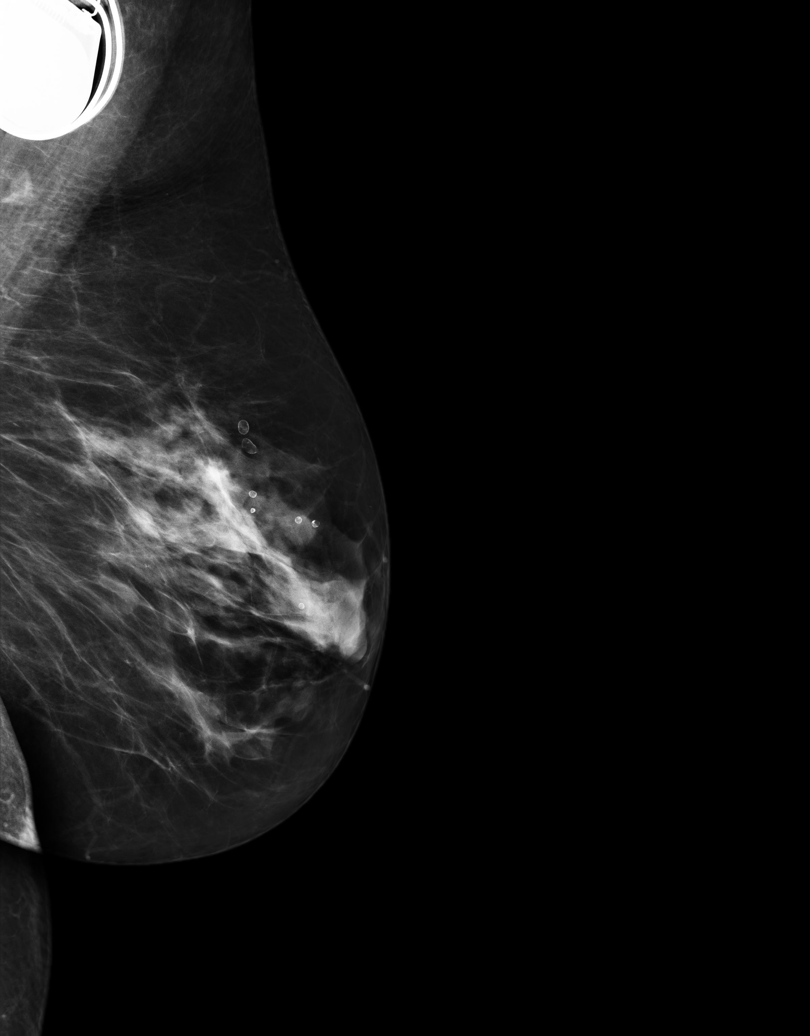
[im 6/6]
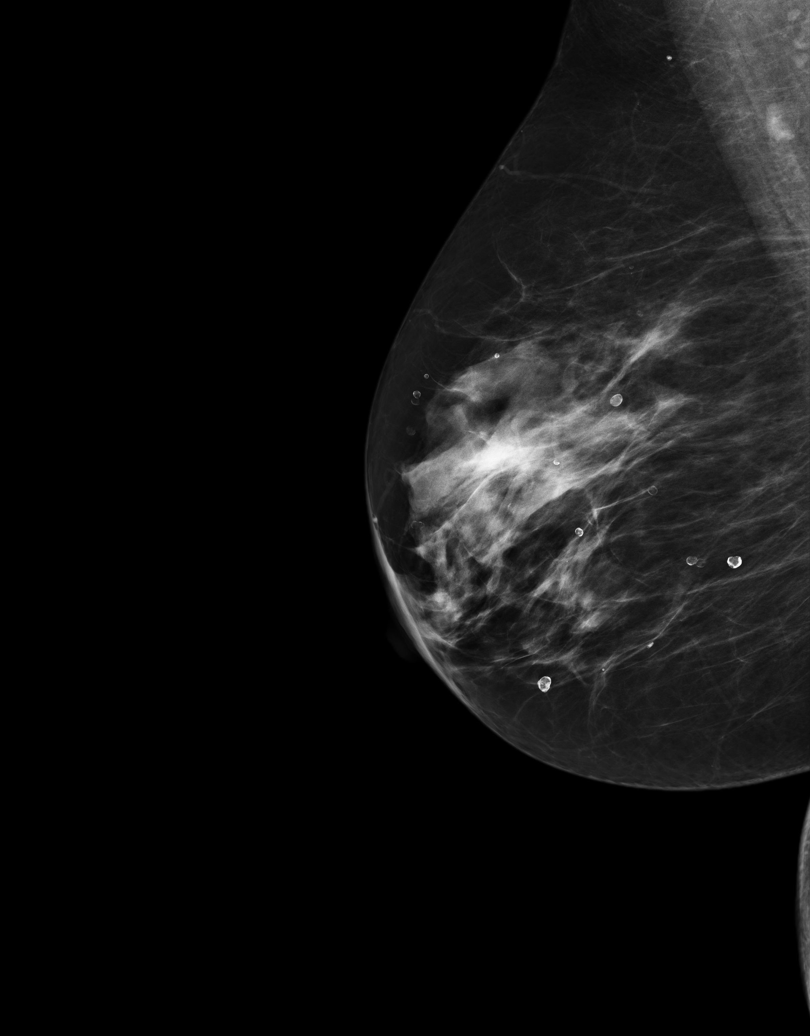

[6 of 6 positions shown; findings below may reference images not displayed]

ACR Breast Density Category b: There are scattered areas of
fibroglandular density.
FINDINGS: There are no findings suspicious for malignancy. Images were
processed with CAD.
IMPRESSION: No mammographic evidence of malignancy. A result letter of this
screening mammogram will be mailed directly to the patient.

RECOMMENDATION:
Screening mammogram in one year. (Code:AS-G-LCT)

BI-RADS CATEGORY  1: Negative.

## 2014-10-13 ENCOUNTER — Ambulatory Visit
Admission: RE | Admit: 2014-10-13 | Discharge: 2014-10-13 | Disposition: A | Discharge status: Home / Self Care | Payer: Medicare (Managed Care) | Source: Ambulatory Visit | Attending: Family Medicine | Admitting: Family Medicine

## 2014-10-13 ENCOUNTER — Other Ambulatory Visit: Payer: Self-pay | Admitting: Family Medicine

## 2014-10-13 DIAGNOSIS — M160 Bilateral primary osteoarthritis of hip: Secondary | ICD-10-CM | POA: Insufficient documentation

## 2014-10-13 DIAGNOSIS — Z9181 History of falling: Secondary | ICD-10-CM | POA: Insufficient documentation

## 2014-10-13 DIAGNOSIS — I70209 Unspecified atherosclerosis of native arteries of extremities, unspecified extremity: Secondary | ICD-10-CM | POA: Diagnosis not present

## 2014-10-13 DIAGNOSIS — M47816 Spondylosis without myelopathy or radiculopathy, lumbar region: Secondary | ICD-10-CM | POA: Diagnosis not present

## 2014-10-13 DIAGNOSIS — W19XXXA Unspecified fall, initial encounter: Secondary | ICD-10-CM

## 2014-10-26 ENCOUNTER — Other Ambulatory Visit: Payer: Self-pay | Admitting: Family Medicine

## 2014-10-26 DIAGNOSIS — Z1231 Encounter for screening mammogram for malignant neoplasm of breast: Secondary | ICD-10-CM

## 2014-11-24 DIAGNOSIS — Z79891 Long term (current) use of opiate analgesic: Secondary | ICD-10-CM | POA: Diagnosis not present

## 2014-11-25 ENCOUNTER — Ambulatory Visit
Admission: RE | Admit: 2014-11-25 | Discharge: 2014-11-25 | Disposition: A | Discharge status: Home / Self Care | Payer: Medicare (Managed Care) | Source: Ambulatory Visit | Attending: Family Medicine | Admitting: Family Medicine

## 2014-11-25 DIAGNOSIS — Z1231 Encounter for screening mammogram for malignant neoplasm of breast: Secondary | ICD-10-CM | POA: Diagnosis not present

## 2015-01-17 IMAGING — CR RIGHT HIP - COMPLETE 2+ VIEW
1 series · 3 of 3 positions shown · non-contrast
Comparison: 05/14/2013

CLINICAL DATA: RIGHT hip pain since this morning, almost fell when
she got up, initial encounter

EXAM:
RIGHT HIP - COMPLETE 2+ VIEW

[Series 1: dxr hip right complete · 0.14mm/px · 3 of 3 slices shown]
[im 1/3]
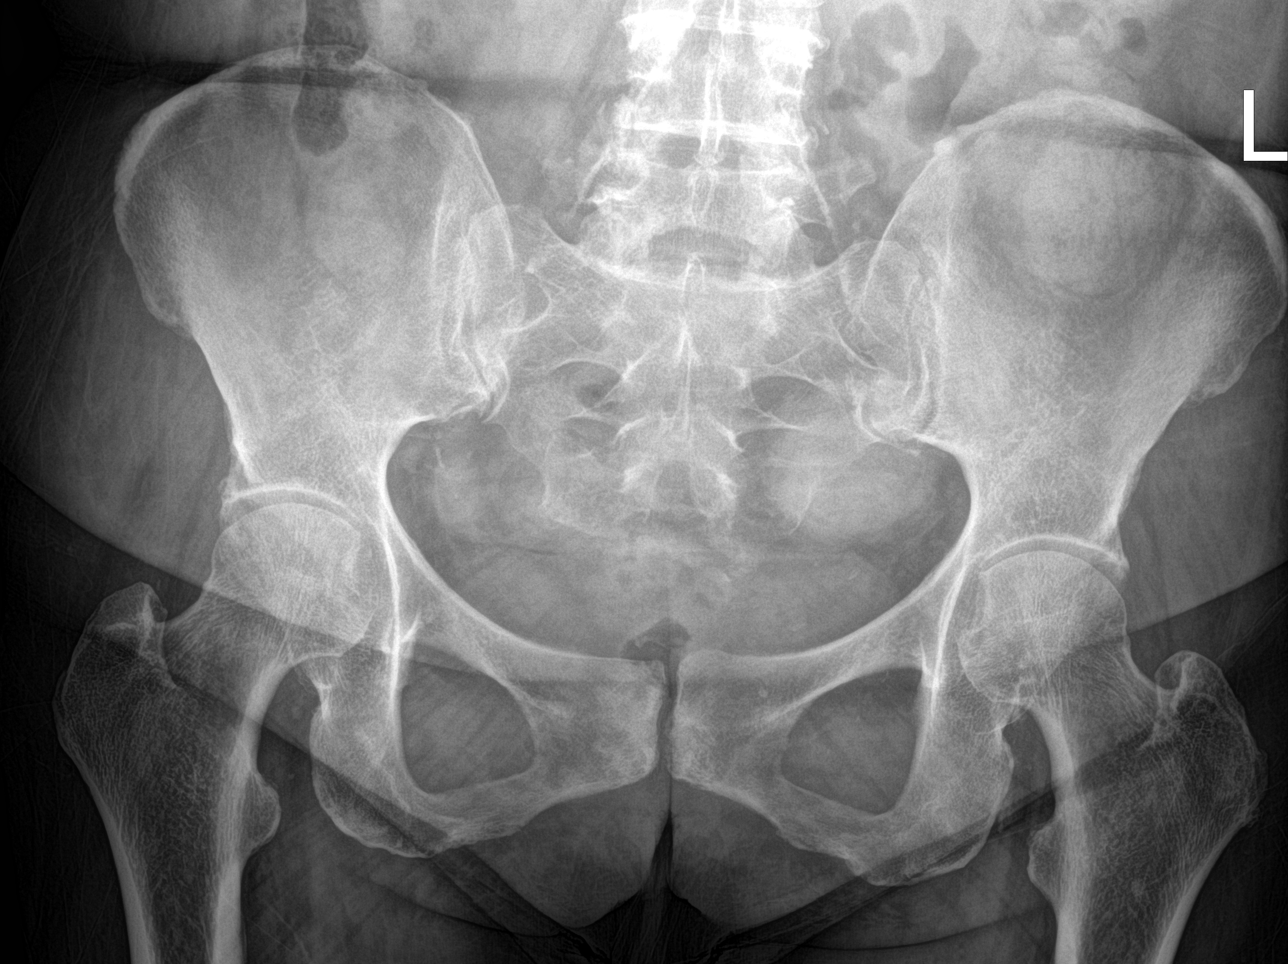
[im 2/3]
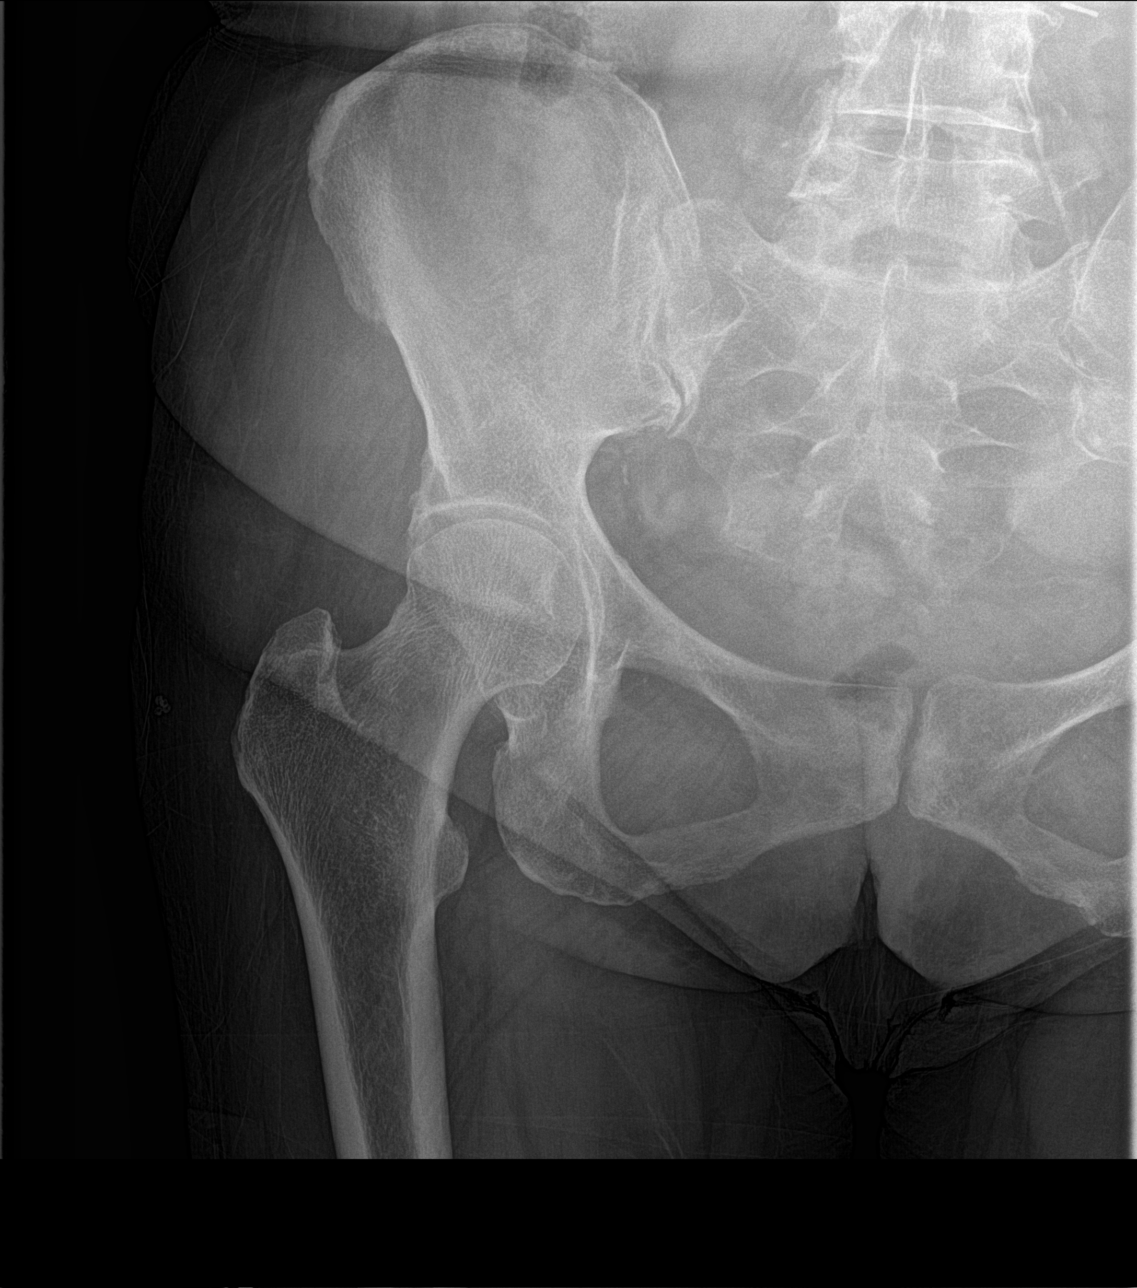
[im 3/3]
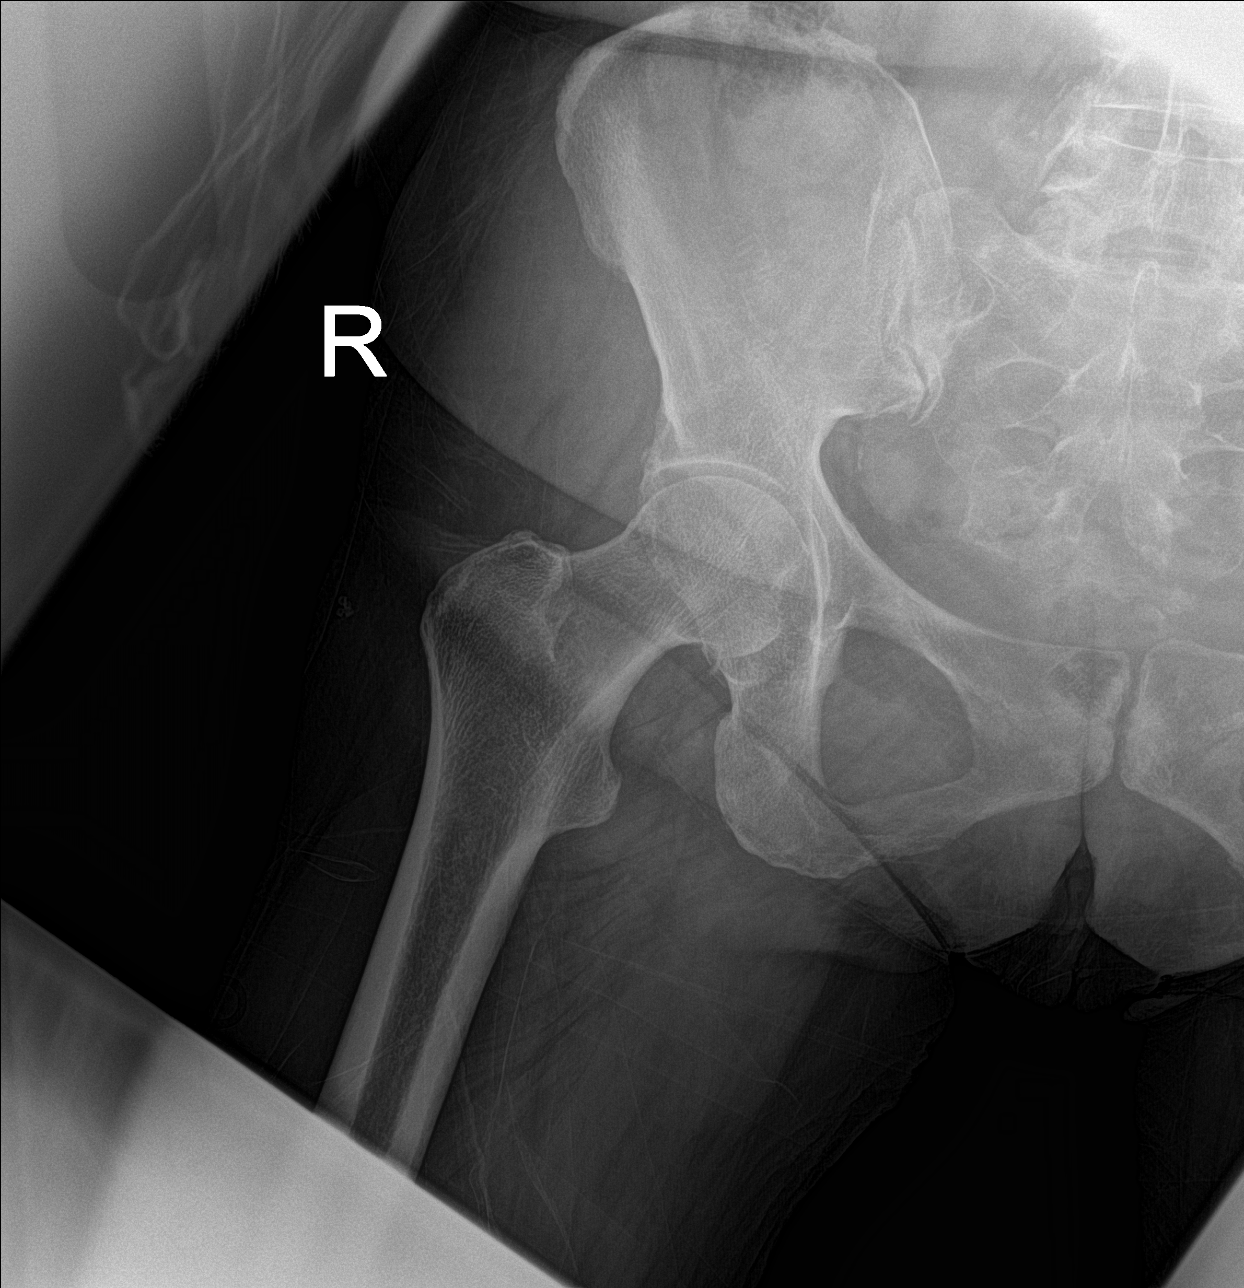

[3 of 3 positions shown; findings below may reference images not displayed]

FINDINGS: Diffuse osseous demineralization.

Hip and SI joint spaces symmetric and preserved.

No acute fracture, dislocation or bone destruction.

Scattered atherosclerotic calcifications.
IMPRESSION: No acute osseous abnormalities.

Osseous demineralization.

## 2015-01-17 IMAGING — CR DG LUMBAR SPINE 2-3V
1 series · 3 of 3 positions shown · non-contrast
Comparison: Lumbar spine 11/01/2011.  CT lumbar spine 03/28/2012.

CLINICAL DATA: When getting up this morning, the patient almost
fell and now has right hip pain. Unable to bear weight on right leg.
Chronic back pain.

EXAM:
LUMBAR SPINE - 2-3 VIEW

[Series 1: dxr lumbar spine ap and lateral · 0.14mm/px · 3 of 3 slices shown]
[im 1/3]
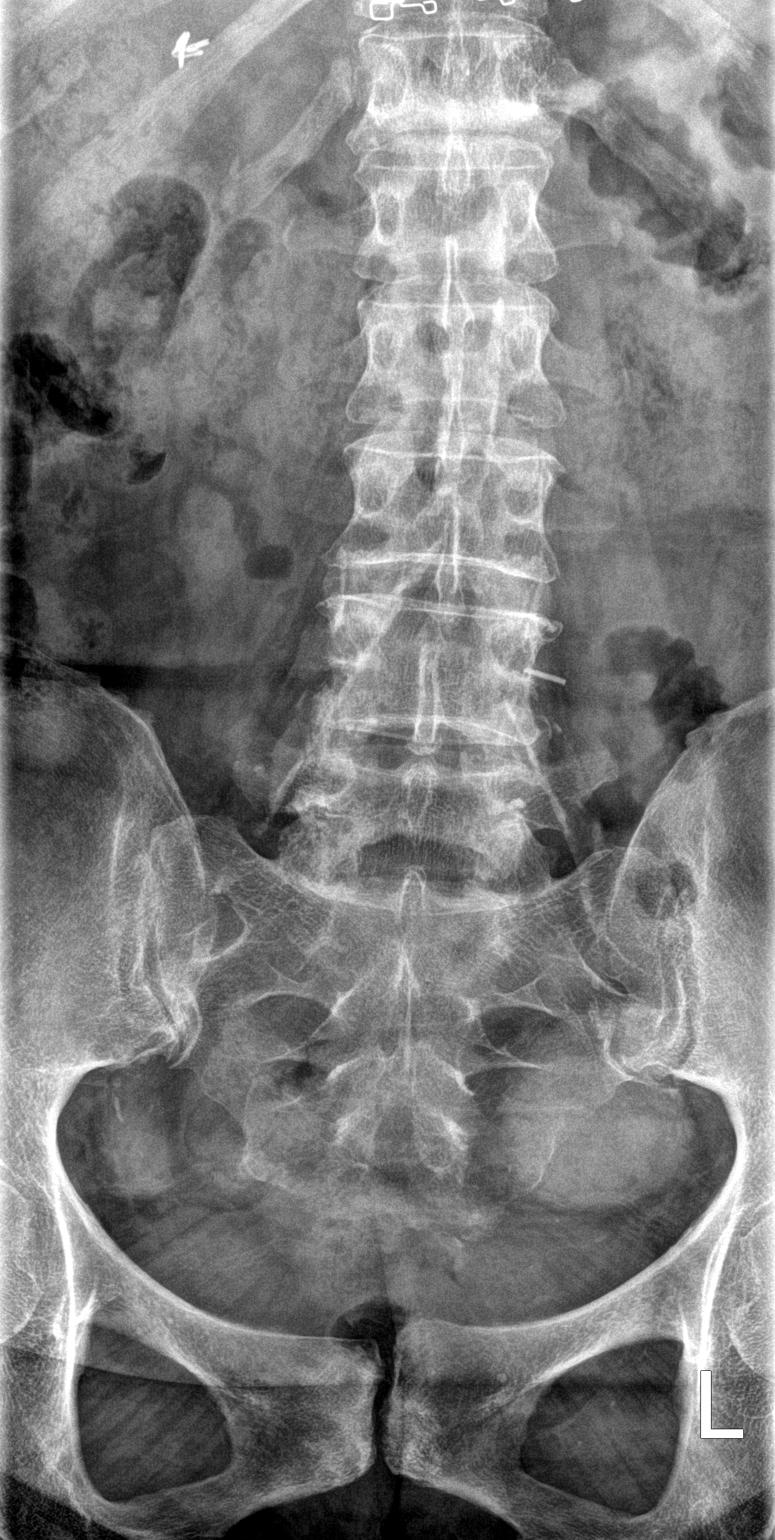
[im 2/3]
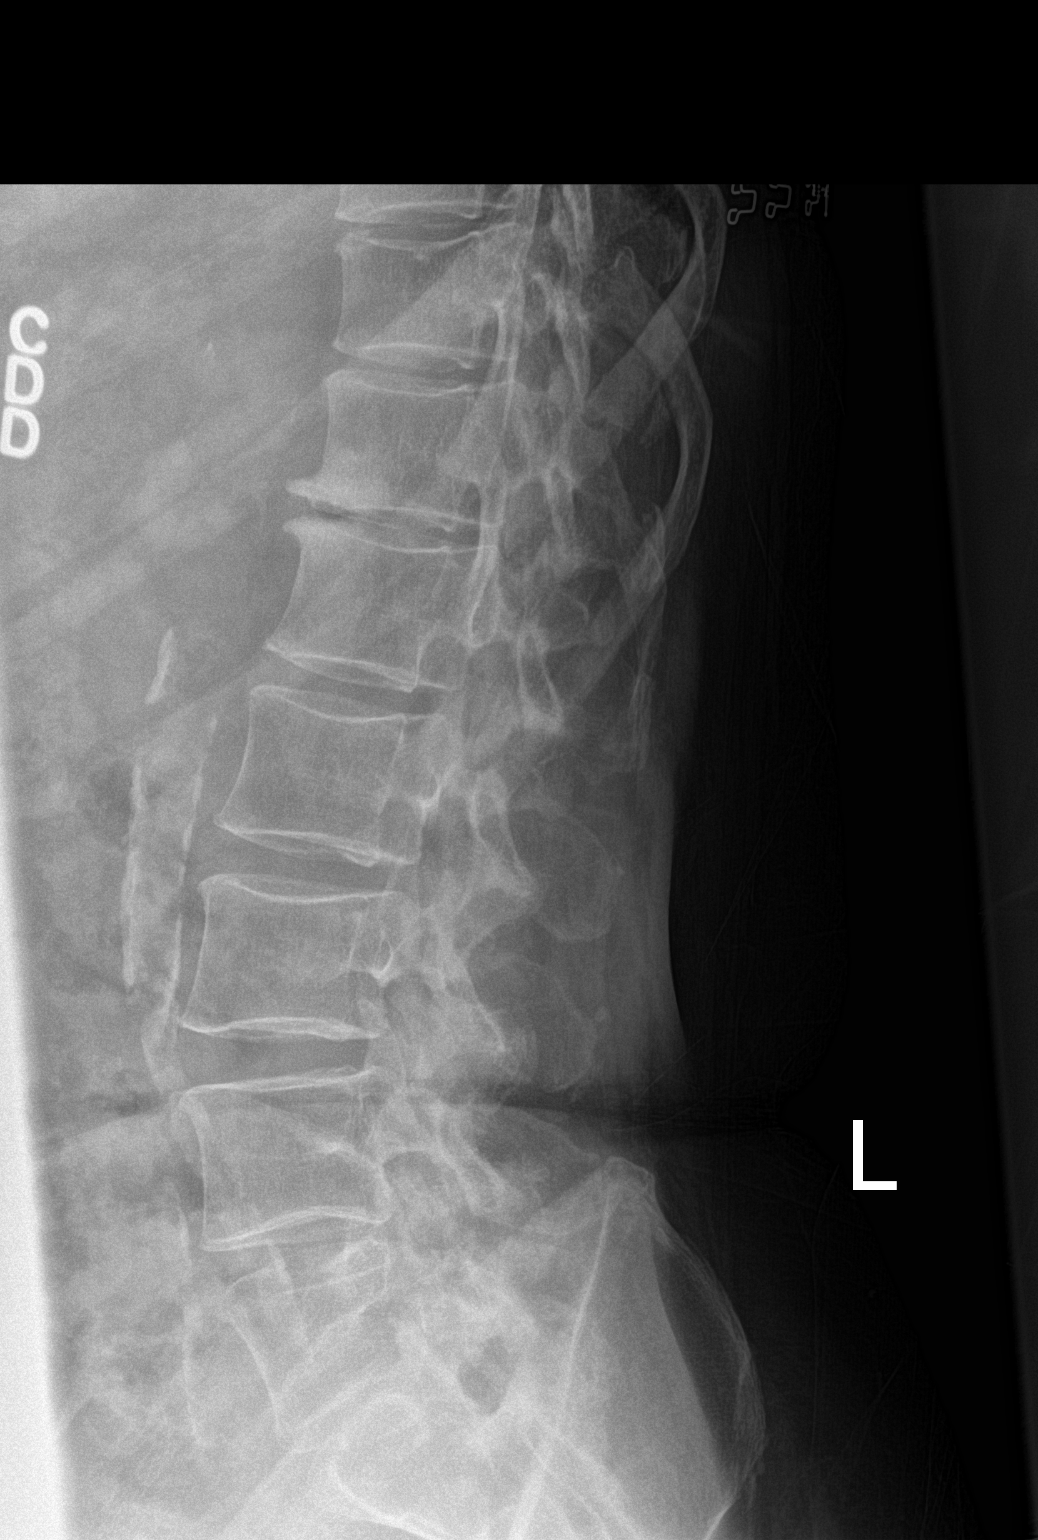
[im 3/3]
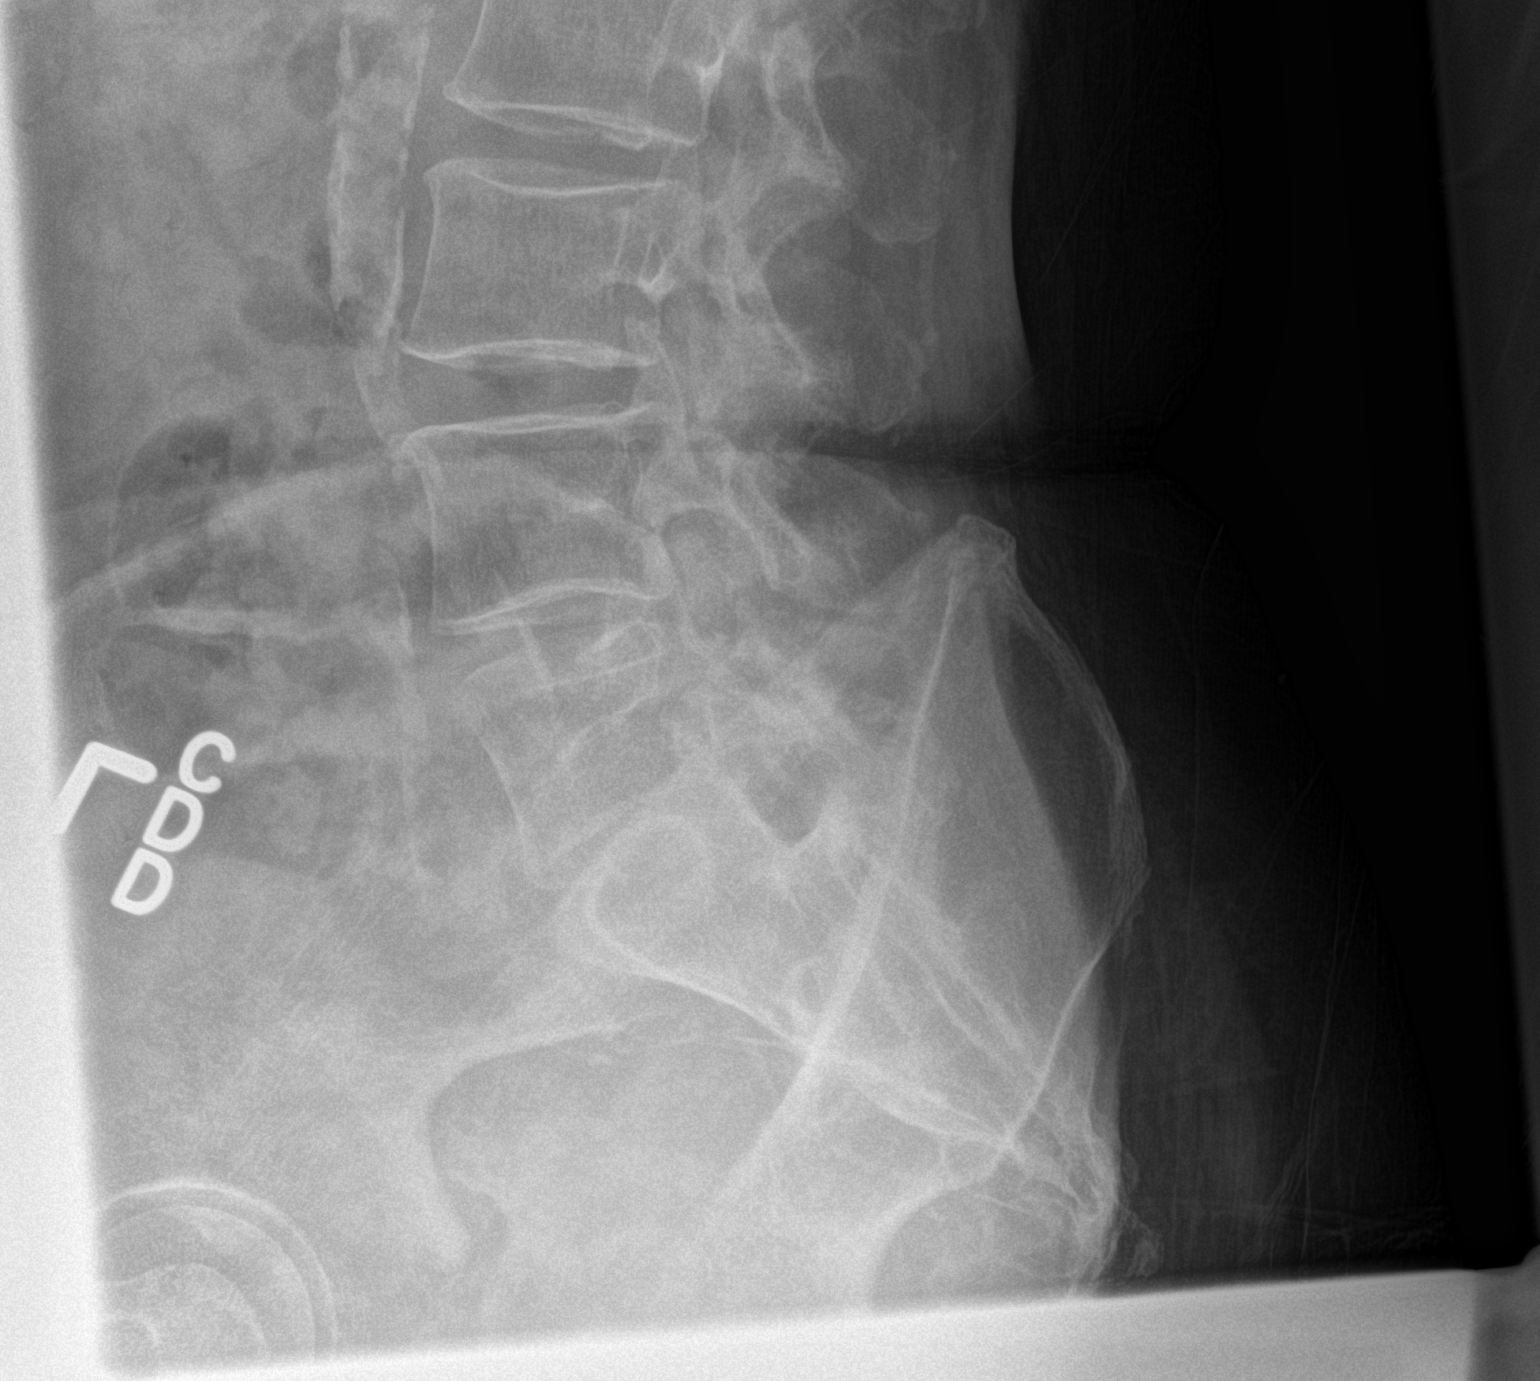

[3 of 3 positions shown; findings below may reference images not displayed]

FINDINGS: Degenerative changes in the lumbar spine with narrowed lumbar
interspaces and associated endplate hypertrophic changes
predominantly at T10-11, T11-12, T12-L1, and L1-2 levels. Normal
alignment. No vertebral compression deformities. Vascular
calcifications in the aorta. No significant changes since prior
study.
IMPRESSION: Degenerative changes in the lumbar spine. No displaced fractures
identified.

## 2015-04-02 ENCOUNTER — Emergency Department: Payer: PRIVATE HEALTH INSURANCE

## 2015-04-02 ENCOUNTER — Emergency Department
Admission: EM | Admit: 2015-04-02 | Discharge: 2015-04-02 | Disposition: A | Discharge status: Home / Self Care | Payer: PRIVATE HEALTH INSURANCE | Attending: Emergency Medicine | Admitting: Emergency Medicine

## 2015-04-02 ENCOUNTER — Encounter: Payer: Self-pay | Admitting: Emergency Medicine

## 2015-04-02 DIAGNOSIS — I10 Essential (primary) hypertension: Secondary | ICD-10-CM | POA: Insufficient documentation

## 2015-04-02 DIAGNOSIS — I2510 Atherosclerotic heart disease of native coronary artery without angina pectoris: Secondary | ICD-10-CM | POA: Diagnosis not present

## 2015-04-02 DIAGNOSIS — F17200 Nicotine dependence, unspecified, uncomplicated: Secondary | ICD-10-CM | POA: Insufficient documentation

## 2015-04-02 DIAGNOSIS — R079 Chest pain, unspecified: Secondary | ICD-10-CM | POA: Diagnosis present

## 2015-04-02 DIAGNOSIS — E119 Type 2 diabetes mellitus without complications: Secondary | ICD-10-CM | POA: Diagnosis not present

## 2015-04-02 HISTORY — DX: Essential (primary) hypertension: I10

## 2015-04-02 HISTORY — DX: Major depressive disorder, single episode, unspecified: F32.9

## 2015-04-02 HISTORY — DX: Depression, unspecified: F32.A

## 2015-04-02 HISTORY — DX: Type 2 diabetes mellitus without complications: E11.9

## 2015-04-02 LAB — BASIC METABOLIC PANEL
Anion gap: 12 (ref 5–15)
BUN: 22 mg/dL — ABNORMAL HIGH (ref 6–20)
CHLORIDE: 101 mmol/L (ref 101–111)
CO2: 22 mmol/L (ref 22–32)
Calcium: 8.9 mg/dL (ref 8.9–10.3)
Creatinine, Ser: 0.96 mg/dL (ref 0.44–1.00)
GFR calc Af Amer: >60 mL/min (ref >60)
GFR calc non Af Amer: 59 mL/min — ABNORMAL LOW (ref >60)
Glucose, Bld: 257 mg/dL — ABNORMAL HIGH (ref 65–99)
Potassium: 3.9 mmol/L (ref 3.5–5.1)
Sodium: 135 mmol/L (ref 135–145)

## 2015-04-02 LAB — CBC
HEMATOCRIT: 36.9 % (ref 35.0–47.0)
HEMOGLOBIN: 12.5 g/dL (ref 12.0–16.0)
MCH: 30.2 pg (ref 26.0–34.0)
MCHC: 34 g/dL (ref 32.0–36.0)
MCV: 89 fL (ref 80.0–100.0)
Platelets: 285 10*3/uL (ref 150–440)
RBC: 4.15 MIL/uL (ref 3.80–5.20)
RDW: 14.7 % — ABNORMAL HIGH (ref 11.5–14.5)
WBC: 12 10*3/uL — ABNORMAL HIGH (ref 3.6–11.0)

## 2015-04-02 LAB — TROPONIN I: Troponin I: <0.03 ng/mL (ref <0.031)

## 2015-04-02 NOTE — Discharge Instructions (Signed)
Nonspecific Chest Pain  °Chest pain can be caused by many different conditions. There is always a chance that your pain could be related to something serious, such as a heart attack or a blood clot in your lungs. Chest pain can also be caused by conditions that are not life-threatening. If you have chest pain, it is very important to follow up with your health care provider. °CAUSES  °Chest pain can be caused by: °· Heartburn. °· Pneumonia or bronchitis. °· Anxiety or stress. °· Inflammation around your heart (pericarditis) or lung (pleuritis or pleurisy). °· A blood clot in your lung. °· A collapsed lung (pneumothorax). It can develop suddenly on its own (spontaneous pneumothorax) or from trauma to the chest. °· Shingles infection (varicella-zoster virus). °· Heart attack. °· Damage to the bones, muscles, and cartilage that make up your chest wall. This can include: °¨ Bruised bones due to injury. °¨ Strained muscles or cartilage due to frequent or repeated coughing or overwork. °¨ Fracture to one or more ribs. °¨ Sore cartilage due to inflammation (costochondritis). °RISK FACTORS  °Risk factors for chest pain may include: °· Activities that increase your risk for trauma or injury to your chest. °· Respiratory infections or conditions that cause frequent coughing. °· Medical conditions or overeating that can cause heartburn. °· Heart disease or family history of heart disease. °· Conditions or health behaviors that increase your risk of developing a blood clot. °· Having had chicken pox (varicella zoster). °SIGNS AND SYMPTOMS °Chest pain can feel like: °· Burning or tingling on the surface of your chest or deep in your chest. °· Crushing, pressure, aching, or squeezing pain. °· Dull or sharp pain that is worse when you move, cough, or take a deep breath. °· Pain that is also felt in your back, neck, shoulder, or arm, or pain that spreads to any of these areas. °Your chest pain may come and go, or it may stay  constant. °DIAGNOSIS °Lab tests or other studies may be needed to find the cause of your pain. Your health care provider may have you take a test called an ambulatory ECG (electrocardiogram). An ECG records your heartbeat patterns at the time the test is performed. You may also have other tests, such as: °· Transthoracic echocardiogram (TTE). During echocardiography, sound waves are used to create a picture of all of the heart structures and to look at how blood flows through your heart. °· Transesophageal echocardiogram (TEE). This is a more advanced imaging test that obtains images from inside your body. It allows your health care provider to see your heart in finer detail. °· Cardiac monitoring. This allows your health care provider to monitor your heart rate and rhythm in real time. °· Holter monitor. This is a portable device that records your heartbeat and can help to diagnose abnormal heartbeats. It allows your health care provider to track your heart activity for several days, if needed. °· Stress tests. These can be done through exercise or by taking medicine that makes your heart beat more quickly. °· Blood tests. °· Imaging tests. °TREATMENT  °Your treatment depends on what is causing your chest pain. Treatment may include: °· Medicines. These may include: °¨ Acid blockers for heartburn. °¨ Anti-inflammatory medicine. °¨ Pain medicine for inflammatory conditions. °¨ Antibiotic medicine, if an infection is present. °¨ Medicines to dissolve blood clots. °¨ Medicines to treat coronary artery disease. °· Supportive care for conditions that do not require medicines. This may include: °¨ Resting. °¨ Applying heat   or cold packs to injured areas. °¨ Limiting activities until pain decreases. °HOME CARE INSTRUCTIONS °· If you were prescribed an antibiotic medicine, finish it all even if you start to feel better. °· Avoid any activities that bring on chest pain. °· Do not use any tobacco products, including  cigarettes, chewing tobacco, or electronic cigarettes. If you need help quitting, ask your health care provider. °· Do not drink alcohol. °· Take medicines only as directed by your health care provider. °· Keep all follow-up visits as directed by your health care provider. This is important. This includes any further testing if your chest pain does not go away. °· If heartburn is the cause for your chest pain, you may be told to keep your head raised (elevated) while sleeping. This reduces the chance that acid will go from your stomach into your esophagus. °· Make lifestyle changes as directed by your health care provider. These may include: °¨ Getting regular exercise. Ask your health care provider to suggest some activities that are safe for you. °¨ Eating a heart-healthy diet. A registered dietitian can help you to learn healthy eating options. °¨ Maintaining a healthy weight. °¨ Managing diabetes, if necessary. °¨ Reducing stress. °SEEK MEDICAL CARE IF: °· Your chest pain does not go away after treatment. °· You have a rash with blisters on your chest. °· You have a fever. °SEEK IMMEDIATE MEDICAL CARE IF:  °· Your chest pain is worse. °· You have an increasing cough, or you cough up blood. °· You have severe abdominal pain. °· You have severe weakness. °· You faint. °· You have chills. °· You have sudden, unexplained chest discomfort. °· You have sudden, unexplained discomfort in your arms, back, neck, or jaw. °· You have shortness of breath at any time. °· You suddenly start to sweat, or your skin gets clammy. °· You feel nauseous or you vomit. °· You suddenly feel light-headed or dizzy. °· Your heart begins to beat quickly, or it feels like it is skipping beats. °These symptoms may represent a serious problem that is an emergency. Do not wait to see if the symptoms will go away. Get medical help right away. Call your local emergency services (911 in the [place] Do not drive yourself to the hospital. °  °This  information is not intended to replace advice given to you by your health care provider. Make sure you discuss any questions you have with your health care provider. °  °Document Released: 11/30/2004 Document Revised: 03/13/2014 Document Reviewed: 09/26/2013 °[employer] Interactive Patient Education ©2016 [employer] ° °

## 2015-04-02 NOTE — ED Notes (Signed)
Pt comes into the ED via EMS from home c/o centralized chest pain that radiates to the back of her neck and shoulder.  Patient denies shortness of breath or nausea but does state she had some mild dizziness.  Patient has a pacemaker placed and took 2 nitro at home 15 minutes apart and present with nitro paste on.  6/10 pain.  Given 325 asp in the field.  H/o DM, HTN, depression.

## 2015-04-02 NOTE — ED Notes (Signed)
Pt verbalized understanding of discharge instructions. NAD at this time. 

## 2015-04-02 NOTE — ED Notes (Signed)
Patient transported to X-ray 

## 2015-04-02 NOTE — ED Provider Notes (Signed)
[facility] Emergency Department Provider Note  ____________________________________________    I have reviewed the triage vital signs and the nursing notes.   HISTORY  Chief Complaint Chest Pain    HPI Melanie King is a 70 y.o. female who presents with complaints of chest tightness. She reports this started at approximately 10 AMas mild chest tightness but did worsen over the next 2 hours to moderate. She does report a history of diabetes as well as coronary artery disease and does have 2 stents. She also notes that she has been exercising on an elliptical machine in the last week so she thinks maybe she injured her chest wall muscles. She reports she took 2 nitroglycerin over about half an hour which did not seem to improve her pain. By the time she got to the ED her pain has completely improved. She has no discomfort. No shortness of breath. No recent travel. No calf pain.     Past Medical History  Diagnosis Date  . Diabetes mellitus without complication (HCC)   . Hypertension   . Depression     There are no active problems to display for this patient.   Past Surgical History  Procedure Laterality Date  . Breast biopsy Right 1994    neg cyst removed  . Abdominal hysterectomy    . Cholecystectomy    . Pacemaker insertion      No current outpatient prescriptions on file.  Allergies Demerol; Fentanyl; and Zantac  Family History  Problem Relation Age of Onset  . Breast cancer Maternal Aunt 19    Social History Social History  Substance Use Topics  . Smoking status: Current Every Day Smoker -- 0.75 packs/day  . Smokeless tobacco: None  . Alcohol Use: No    Review of Systems  Constitutional: Negative for fever. Eyes: Negative for visual changes. ENT: Negative for sore throat Cardiovascular: As above for chest pain Respiratory: Negative for shortness of breath. Gastrointestinal: Negative for abdominal pain, vomiting and  diarrhea. Genitourinary: Negative for dysuria. Musculoskeletal: Negative for back pain. Skin: Negative for rash. Neurological: Negative for headaches Psychiatric: No anxiety    ____________________________________________   PHYSICAL EXAM:  VITAL SIGNS: ED Triage Vitals  Enc Vitals Group     BP 04/02/15 1413 114/61 mmHg     Pulse Rate 04/02/15 1413 69     Resp 04/02/15 1413 18     Temp 04/02/15 1413 98 F (36.7 C)     Temp Source 04/02/15 1413 Oral     SpO2 04/02/15 1413 98 %     Weight 04/02/15 1413 146 lb (66.225 kg)     Height 04/02/15 1413  (1.549 m)     Head Cir --      Peak Flow --      Pain Score 04/02/15 1404 6     Pain Loc --      Pain Edu? --      Excl. in GC? --      Constitutional: Alert and oriented. Well appearing and in no distress. Eyes: Conjunctivae are normal.  ENT   Head: Normocephalic and atraumatic.   Mouth/Throat: Mucous membranes are moist. Cardiovascular: Normal rate, regular rhythm. Normal and symmetric distal pulses are present in all extremities. Left chest pacemaker without tenderness or erythema Respiratory: Normal respiratory effort without tachypnea nor retractions. Breath sounds are clear and equal bilaterally.  Gastrointestinal: Soft and non-tender in all quadrants. No distention. There is no CVA tenderness. Genitourinary: deferred Musculoskeletal: Nontender with normal  range of motion in all extremities. No lower extremity tenderness nor edema. Neurologic:  Normal speech and language. No gross focal neurologic deficits are appreciated. Skin:  Skin is warm, dry and intact. No rash noted. Psychiatric: Mood and affect are normal. Patient exhibits appropriate insight and judgment.  ____________________________________________    LABS (pertinent positives/negatives)  Labs Reviewed  BASIC METABOLIC PANEL - Abnormal; Notable for the following:    Glucose, Bld 257 (*)    BUN 22 (*)    GFR calc non Af Amer 59 (*)    All  other components within normal limits  CBC - Abnormal; Notable for the following:    WBC 12.0 (*)    RDW 14.7 (*)    All other components within normal limits  TROPONIN I  TROPONIN I    ____________________________________________   EKG  ED ECG REPORT I, Jene Every, the attending physician, personally viewed and interpreted this ECG.   Date: 04/02/2015  EKG Time: 2:03 PM  Rate: 70  Rhythm: Atrial paced rhythm  Axis: Normal  Intervals:right bundle branch block  ST&T Change: Nonspecific changes   ____________________________________________    RADIOLOGY I have personally reviewed any xrays that were ordered on this patient: Chest x-ray is unremarkable  ____________________________________________   PROCEDURES  Procedure(s) performed: none  Critical Care performed: none  ____________________________________________   INITIAL IMPRESSION / ASSESSMENT AND PLAN / ED COURSE  Pertinent labs & imaging results that were available during my care of the patient were reviewed by me and considered in my medical decision making (see chart for details).  Patient with history of diabetes hypertension CHF and coronary artery disease who presents with chest tightness which started approximately 4 hours prior to arrival. Her initial labs are reassuring, her chest x-ray is normal, her EKG is unremarkable. We will check a second troponin after 6 hours from onset of pain given that she is pain-free in the emergency department.  Second troponin is normal. Patient remains pain-free. I will discharge her with follow-up with her cardiologist. She agrees with this plan and agrees to return if any redevelopment of her chest pain  ____________________________________________   FINAL CLINICAL IMPRESSION(S) / ED DIAGNOSES  Final diagnoses:  Chest pain, unspecified chest pain type     Jene Every, MD 04/02/15 1755

## 2015-08-10 DIAGNOSIS — R531 Weakness: Secondary | ICD-10-CM | POA: Diagnosis not present

## 2015-08-10 DIAGNOSIS — N3289 Other specified disorders of bladder: Secondary | ICD-10-CM | POA: Diagnosis not present

## 2015-08-10 DIAGNOSIS — R05 Cough: Secondary | ICD-10-CM | POA: Diagnosis not present

## 2015-09-21 IMAGING — CR DG HIP (WITH OR WITHOUT PELVIS) 2-3V*R*
1 series · 3 of 3 positions shown · non-contrast
Comparison: None.

CLINICAL DATA: History of fall.  Initial evaluation .

EXAM:
DG HIP (WITH OR WITHOUT PELVIS) 2-3V RIGHT

[Series 1: dg hip unilat with pelvis 2-3 views righ · 0.14mm/px · 3 of 3 slices shown]
[im 1/3]
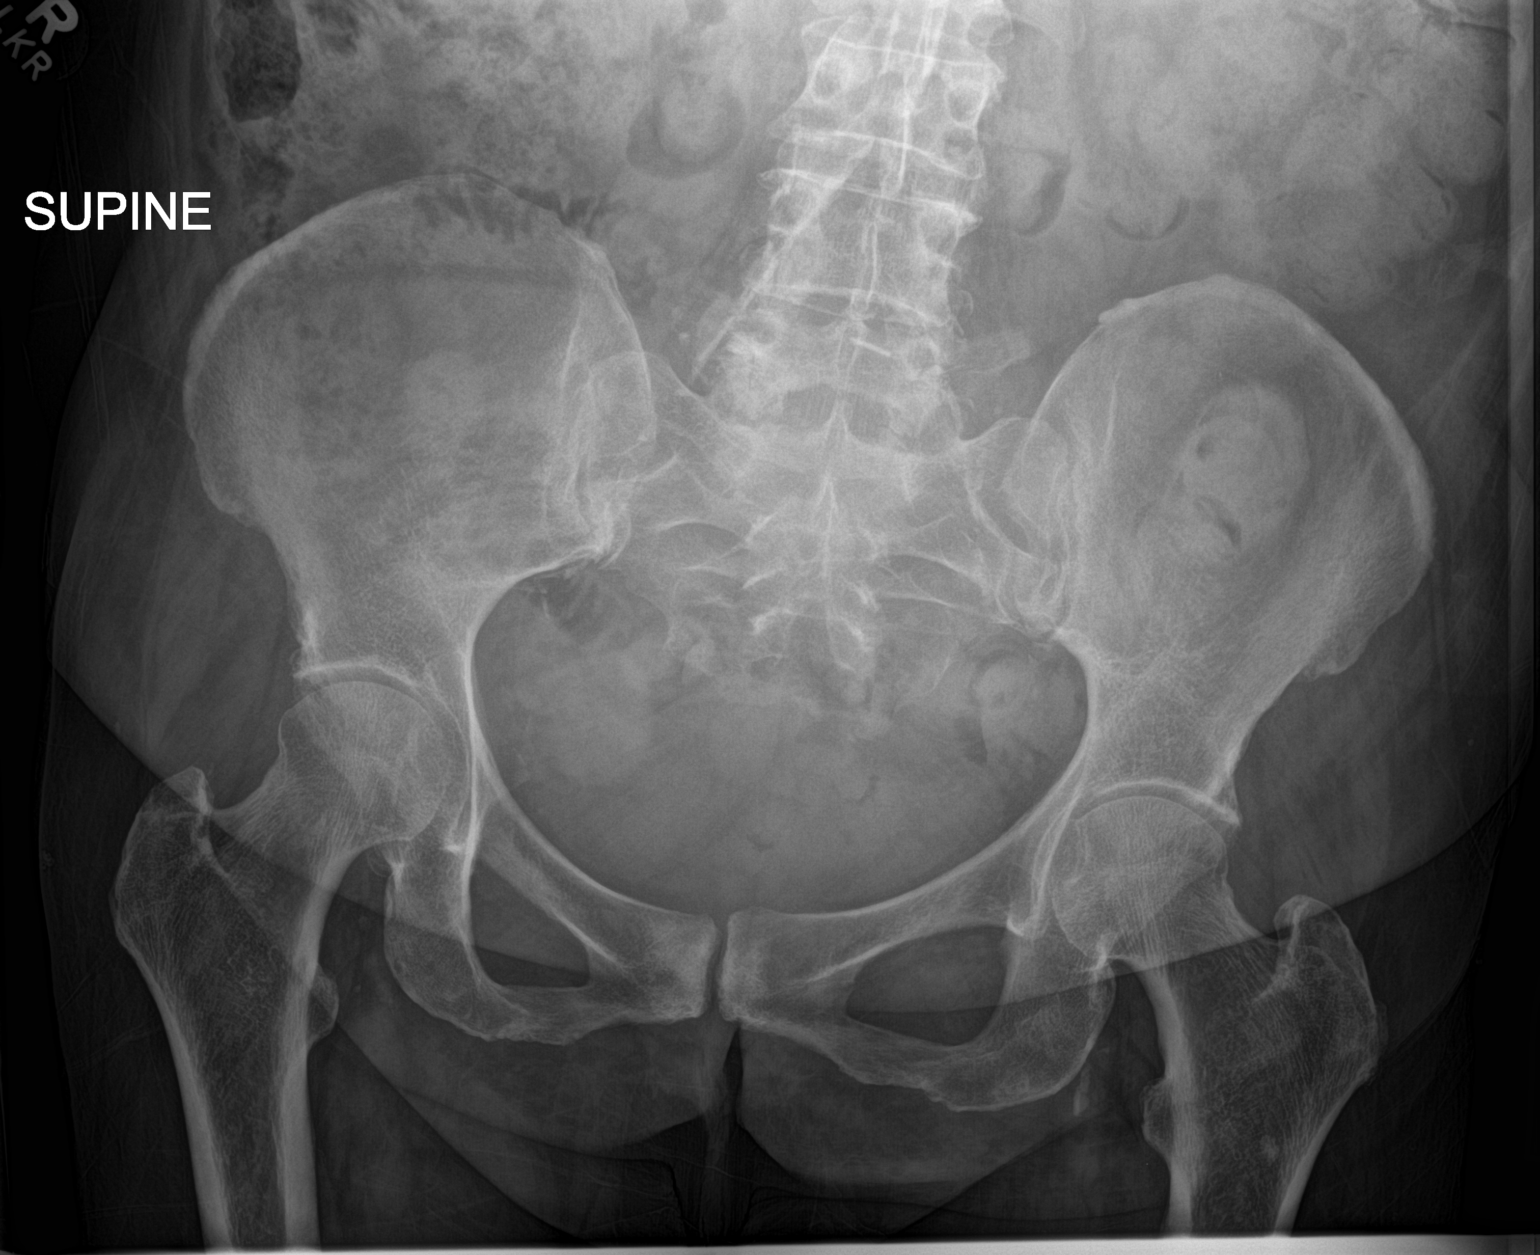
[im 2/3]
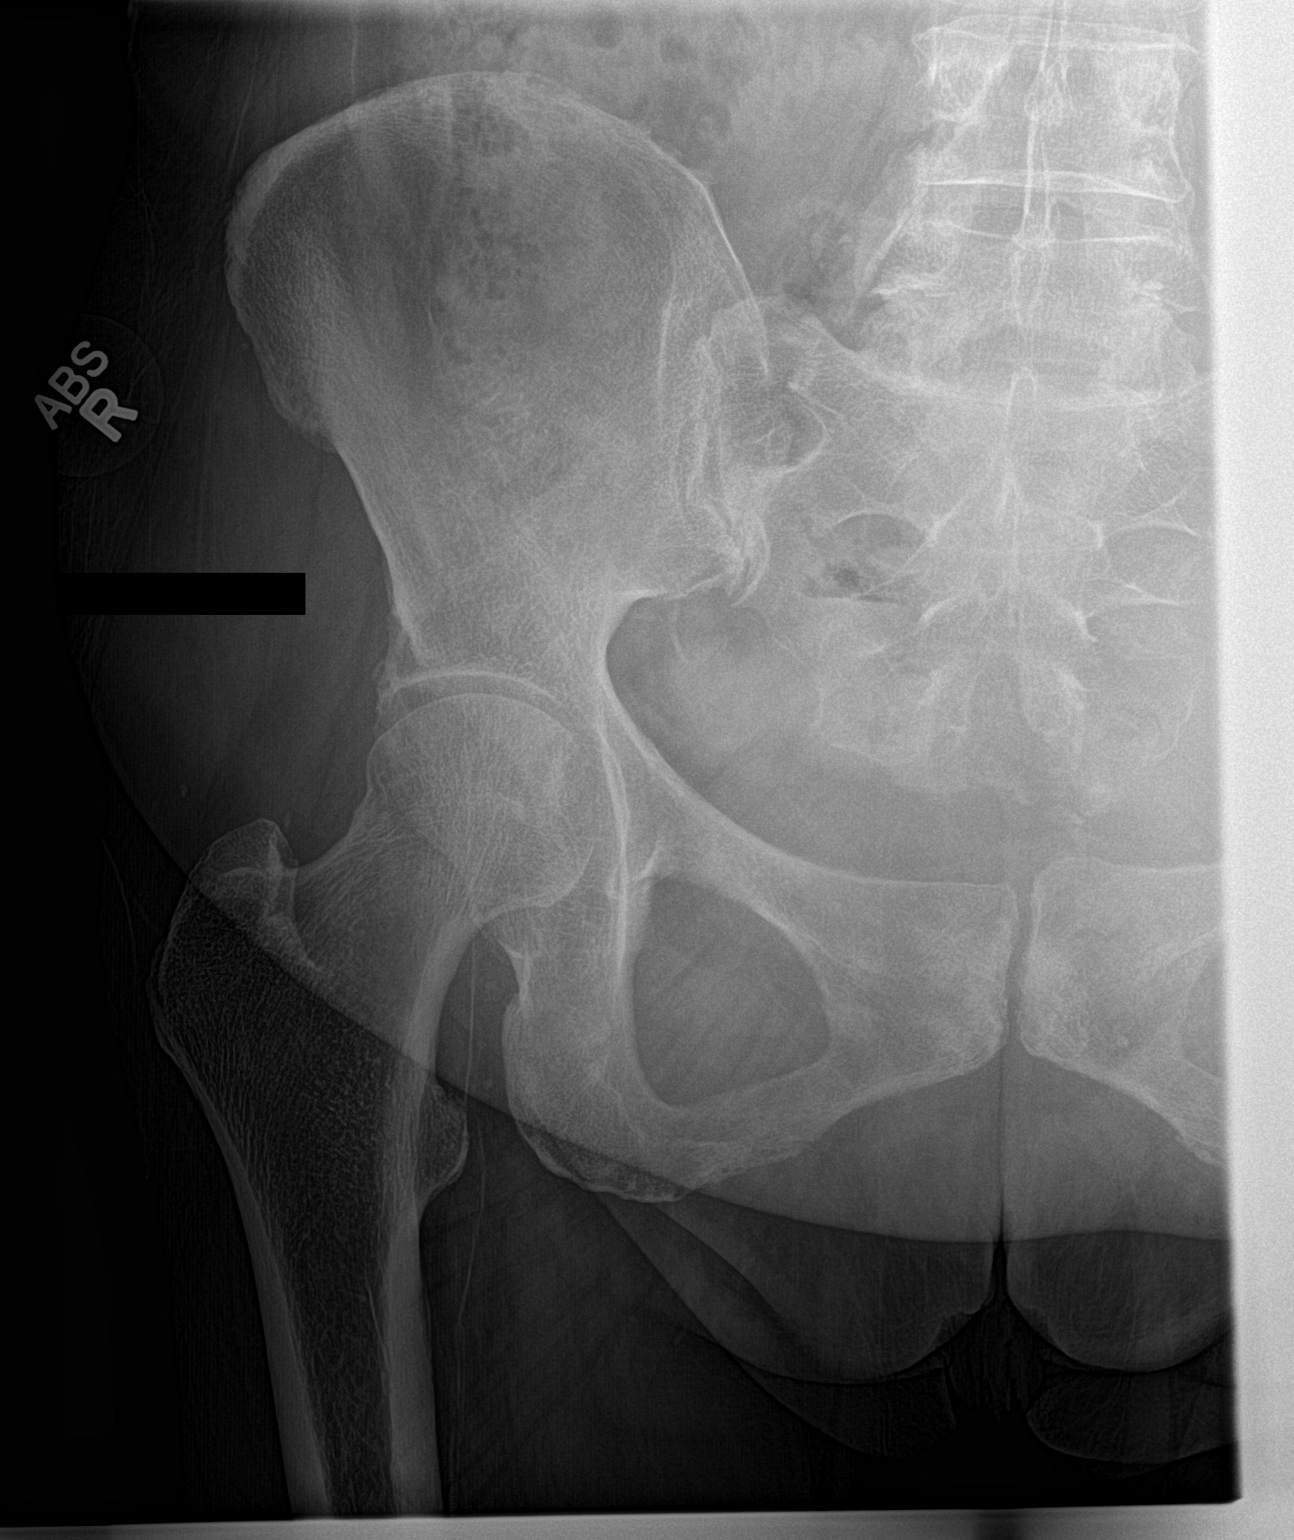
[im 3/3]
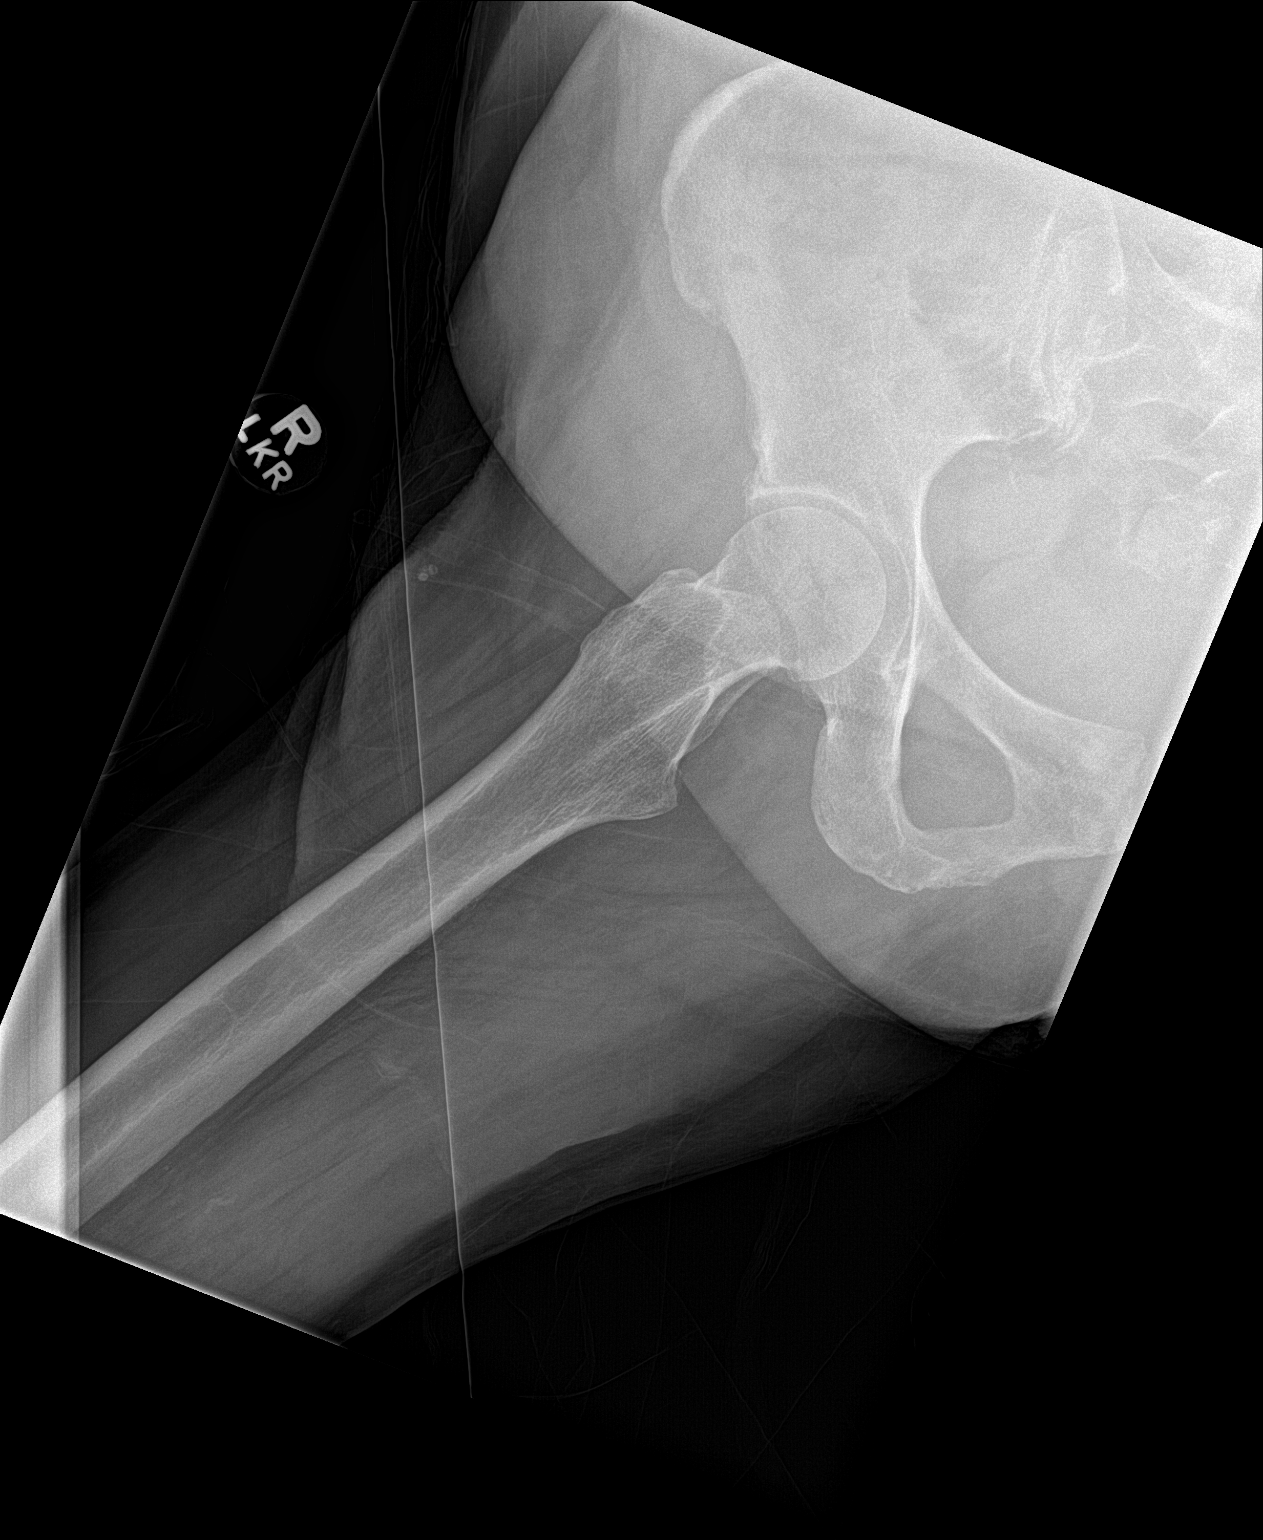

[3 of 3 positions shown; findings below may reference images not displayed]

FINDINGS: Degenerative changes lumbar spine and both hips. No acute bony
abnormality identified. Aortoiliac atherosclerotic vascular
calcification.
IMPRESSION: 1. Degenerative changes lumbar spine and both hips. No acute
abnormality.

2.  Aortoiliac atherosclerotic vascular disease.

## 2015-11-01 ENCOUNTER — Other Ambulatory Visit: Payer: Self-pay | Admitting: Family Medicine

## 2015-11-01 DIAGNOSIS — Z720 Tobacco use: Secondary | ICD-10-CM

## 2015-11-03 IMAGING — MG MM DIGITAL SCREENING BILATERAL
1 series · 4 of 4 positions shown · non-contrast
Comparison: Previous exam(s).

CLINICAL DATA: Screening.

EXAM:
DIGITAL SCREENING BILATERAL MAMMOGRAM WITH CAD

[R CC · right · 4 of 4 slices shown]
[im 1/4]
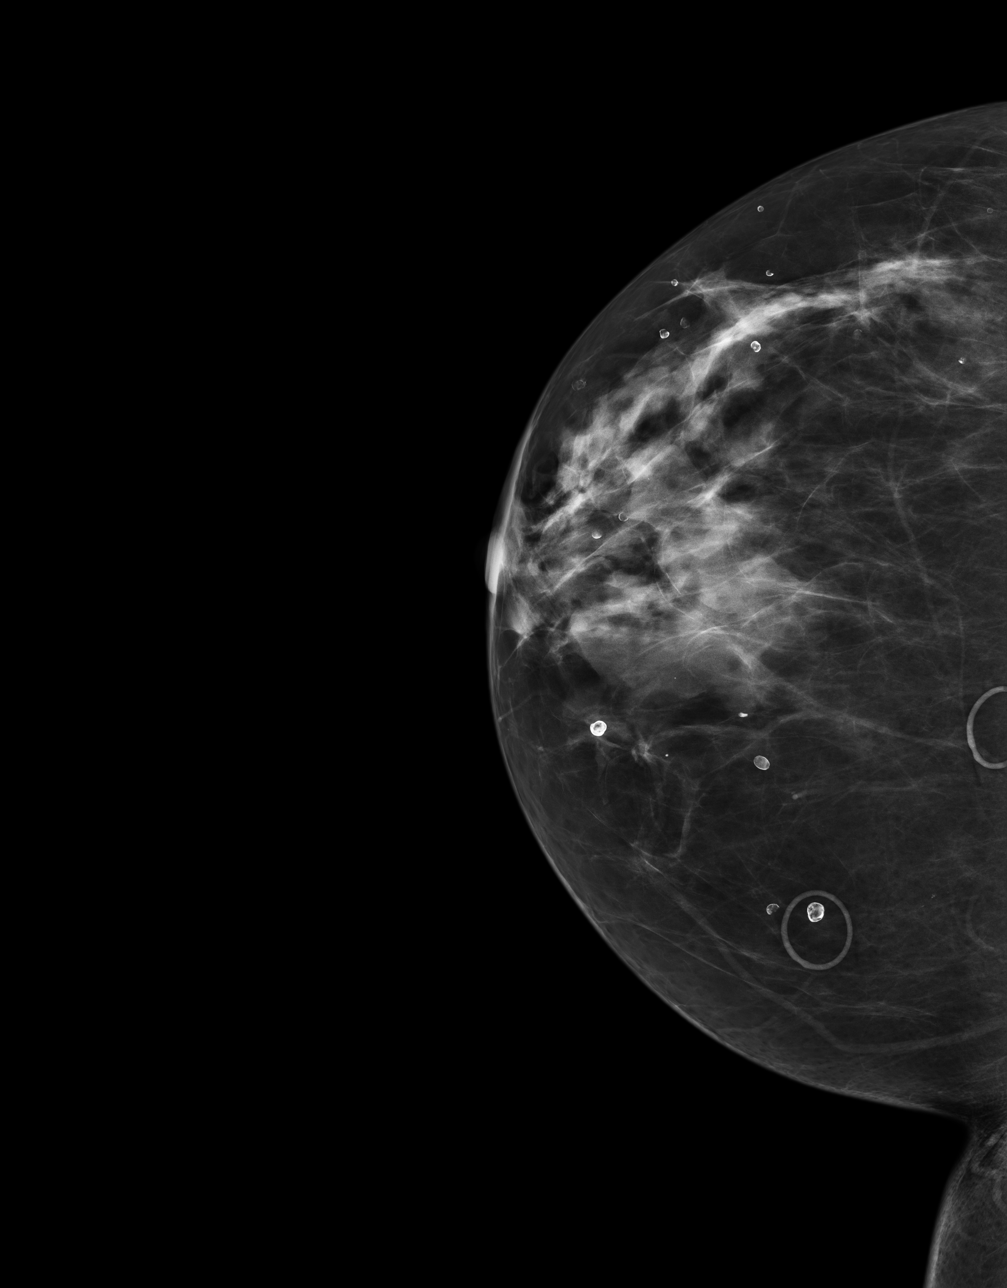
[im 2/4]
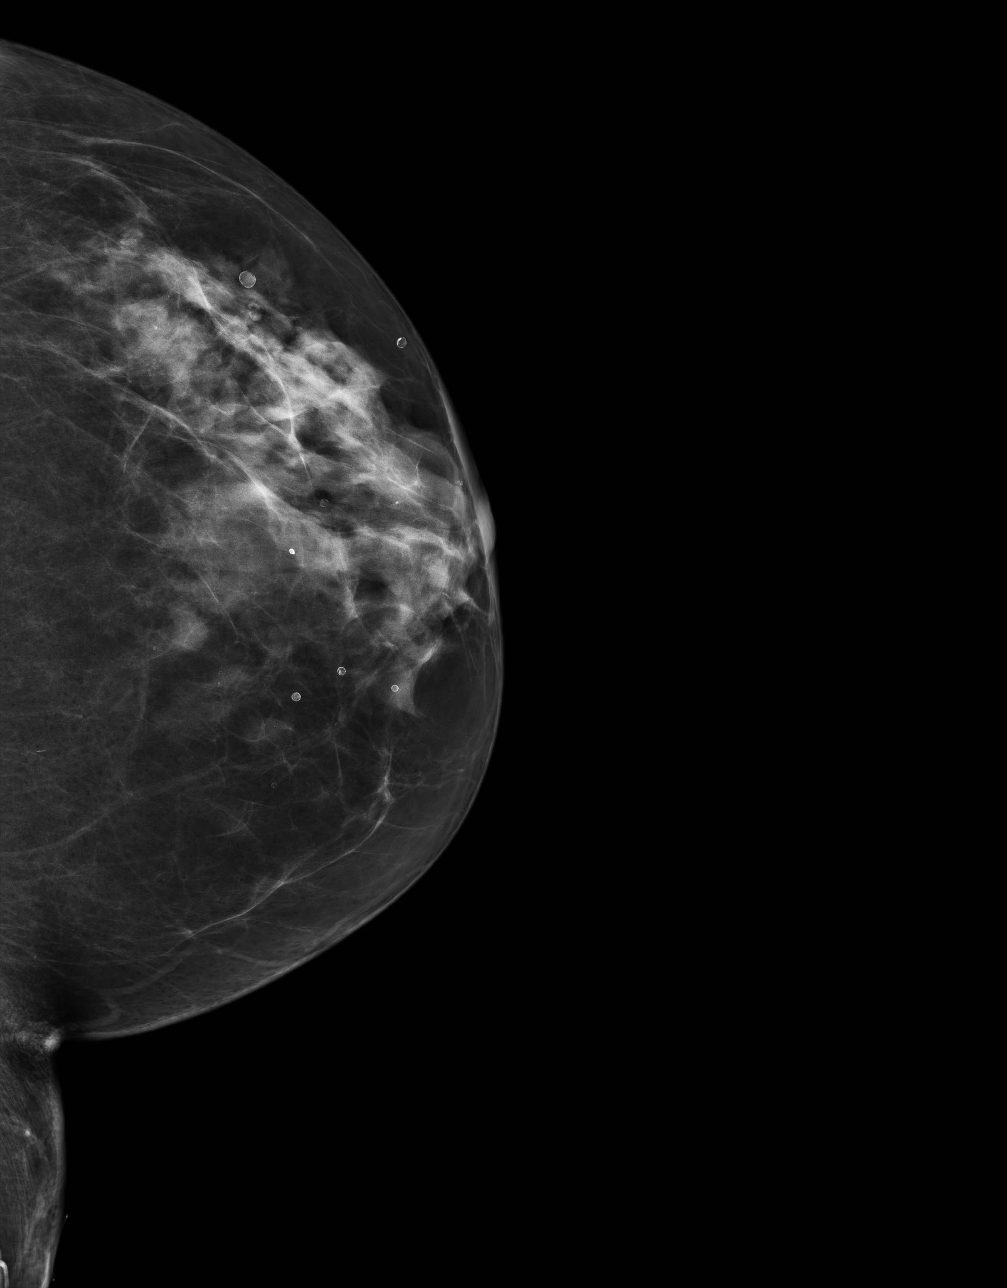
[im 3/4]
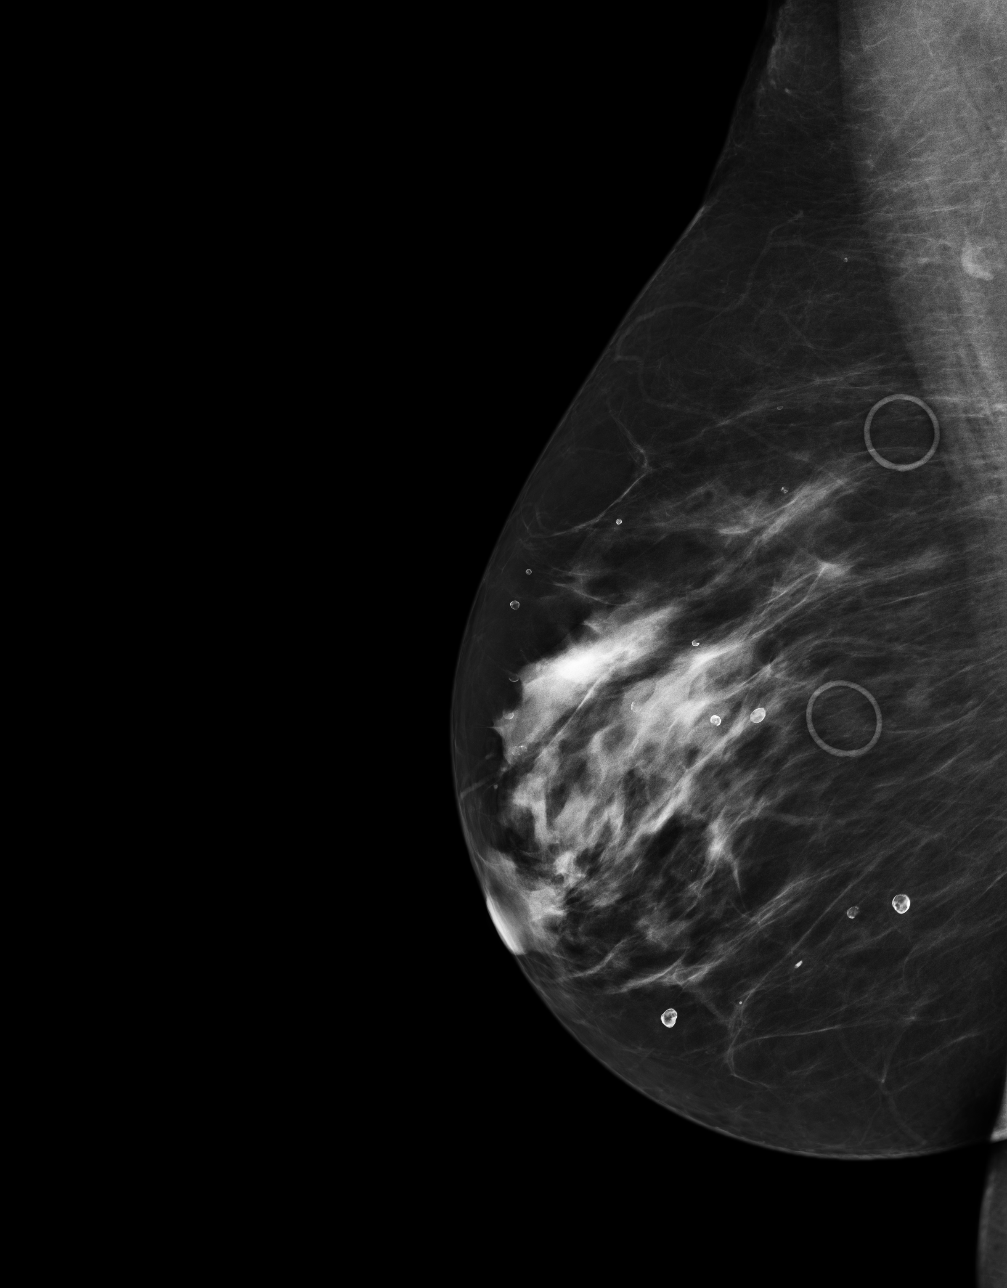
[im 4/4]
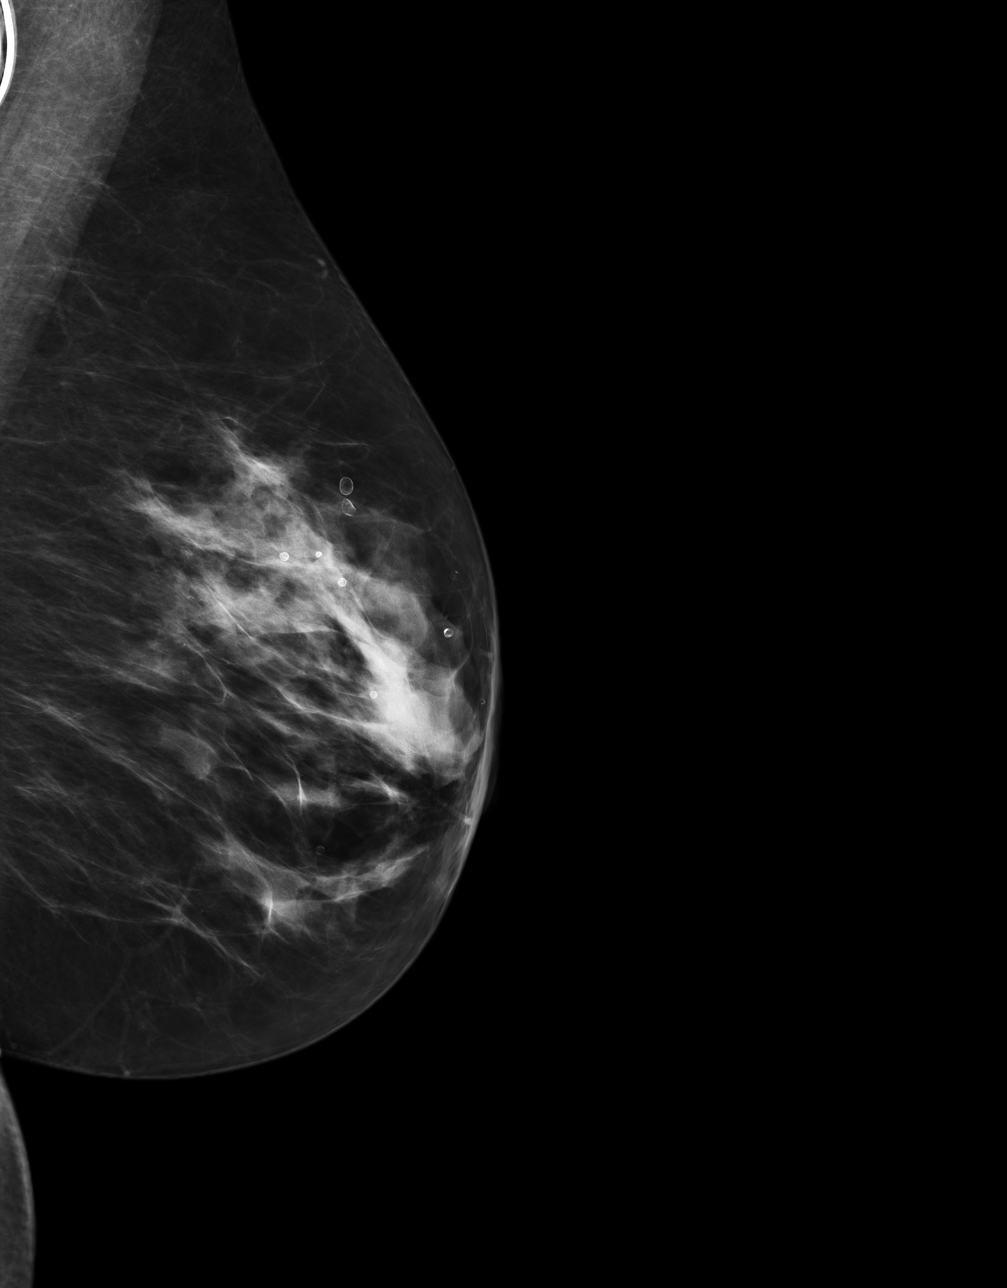

[4 of 4 positions shown; findings below may reference images not displayed]

ACR Breast Density Category c: The breast tissue is heterogeneously
dense, which may obscure small masses.
FINDINGS: There are no findings suspicious for malignancy. Images were
processed with CAD.
IMPRESSION: No mammographic evidence of malignancy. A result letter of this
screening mammogram will be mailed directly to the patient.

RECOMMENDATION:
Screening mammogram in one year. (Code:YJ-[id number])

BI-RADS CATEGORY  1: Negative.

## 2015-11-10 ENCOUNTER — Telehealth: Payer: Self-pay | Admitting: *Deleted

## 2015-11-10 NOTE — Telephone Encounter (Signed)
Received referral for initial lung cancer screening scan. Contacted patient and obtained smoking history,(current, 37.5 pack year ) as well as answering questions related to screening process. Patient denies signs of lung cancer such as weight loss or hemoptysis. Patient denies comorbidity that would prevent curative treatment if lung cancer were found. Patient is tentatively scheduled for shared decision making visit and CT scan on 11/16/15 at 1:30pm, pending insurance approval from business office.

## 2015-11-15 ENCOUNTER — Other Ambulatory Visit: Payer: Self-pay | Admitting: *Deleted

## 2015-11-15 DIAGNOSIS — Z87891 Personal history of nicotine dependence: Secondary | ICD-10-CM

## 2015-11-16 ENCOUNTER — Inpatient Hospital Stay: Payer: PRIVATE HEALTH INSURANCE | Attending: Oncology | Admitting: Oncology

## 2015-11-16 ENCOUNTER — Encounter: Payer: Self-pay | Admitting: Oncology

## 2015-11-16 ENCOUNTER — Ambulatory Visit
Admission: RE | Admit: 2015-11-16 | Discharge: 2015-11-16 | Disposition: A | Discharge status: Home / Self Care | Payer: Medicare (Managed Care) | Source: Ambulatory Visit | Attending: Oncology | Admitting: Oncology

## 2015-11-16 DIAGNOSIS — Z87891 Personal history of nicotine dependence: Secondary | ICD-10-CM | POA: Insufficient documentation

## 2015-11-16 DIAGNOSIS — Z122 Encounter for screening for malignant neoplasm of respiratory organs: Secondary | ICD-10-CM | POA: Diagnosis not present

## 2015-11-16 DIAGNOSIS — R918 Other nonspecific abnormal finding of lung field: Secondary | ICD-10-CM | POA: Insufficient documentation

## 2015-11-16 NOTE — Progress Notes (Signed)
In accordance with CMS guidelines, patient has met eligibility criteria including age, absence of signs or symptoms of lung cancer.  Social History  Substance Use Topics  . Smoking status: Current Every Day Smoker    Packs/day: 0.75    Years: 50.00    Types: Cigarettes  . Smokeless tobacco: Not on file  . Alcohol use No     A shared decision-making session was conducted prior to the performance of CT scan. This includes one or more decision aids, includes benefits and harms of screening, follow-up diagnostic testing, over-diagnosis, false positive rate, and total radiation exposure.  Counseling on the importance of adherence to annual lung cancer LDCT screening, impact of co-morbidities, and ability or willingness to undergo diagnosis and treatment is imperative for compliance of the program.  Counseling on the importance of continued smoking cessation for former smokers; the importance of smoking cessation for current smokers, and information about tobacco cessation interventions have been given to patient including [place] and [employer] quit [place] programs.  Written order for lung cancer screening with LDCT has been given to the patient and any and all questions have been answered to the best of my abilities.   Yearly follow up will be coordinated by Burgess Estelle, Thoracic Navigator.

## 2015-11-18 ENCOUNTER — Telehealth: Payer: Self-pay | Admitting: *Deleted

## 2015-11-18 NOTE — Telephone Encounter (Signed)
Notified patient of LDCT lung cancer screening results with recommendation for 12 month follow up imaging. Patient verbalizes understanding.   IMPRESSION: Scattered noncalcified bilateral pulmonary nodules measuring up to 3 mm in the left upper lobe. Lung-RADS Category 2, benign appearance or behavior. Continue annual screening with low-dose chest CT without contrast in 12 months.

## 2015-12-03 DIAGNOSIS — F332 Major depressive disorder, recurrent severe without psychotic features: Secondary | ICD-10-CM | POA: Diagnosis not present

## 2016-01-17 ENCOUNTER — Encounter: Payer: Self-pay | Admitting: Unknown Physician Specialty

## 2016-01-17 ENCOUNTER — Ambulatory Visit (INDEPENDENT_AMBULATORY_CARE_PROVIDER_SITE_OTHER): Payer: Medicare Other | Admitting: Unknown Physician Specialty

## 2016-01-17 VITALS — BP 132/75 | HR 70 | Temp 98.0°F | Ht 62.0 in | Wt 135.2 lb

## 2016-01-17 DIAGNOSIS — I519 Heart disease, unspecified: Secondary | ICD-10-CM | POA: Diagnosis not present

## 2016-01-17 DIAGNOSIS — G8929 Other chronic pain: Secondary | ICD-10-CM | POA: Insufficient documentation

## 2016-01-17 DIAGNOSIS — E1165 Type 2 diabetes mellitus with hyperglycemia: Secondary | ICD-10-CM | POA: Diagnosis not present

## 2016-01-17 DIAGNOSIS — J441 Chronic obstructive pulmonary disease with (acute) exacerbation: Secondary | ICD-10-CM | POA: Insufficient documentation

## 2016-01-17 DIAGNOSIS — G894 Chronic pain syndrome: Secondary | ICD-10-CM

## 2016-01-17 DIAGNOSIS — I10 Essential (primary) hypertension: Secondary | ICD-10-CM | POA: Insufficient documentation

## 2016-01-17 DIAGNOSIS — J0100 Acute maxillary sinusitis, unspecified: Secondary | ICD-10-CM | POA: Diagnosis not present

## 2016-01-17 DIAGNOSIS — Z23 Encounter for immunization: Secondary | ICD-10-CM | POA: Diagnosis not present

## 2016-01-17 DIAGNOSIS — I209 Angina pectoris, unspecified: Secondary | ICD-10-CM | POA: Insufficient documentation

## 2016-01-17 DIAGNOSIS — E785 Hyperlipidemia, unspecified: Secondary | ICD-10-CM | POA: Insufficient documentation

## 2016-01-17 DIAGNOSIS — I495 Sick sinus syndrome: Secondary | ICD-10-CM | POA: Insufficient documentation

## 2016-01-17 DIAGNOSIS — I2510 Atherosclerotic heart disease of native coronary artery without angina pectoris: Secondary | ICD-10-CM | POA: Insufficient documentation

## 2016-01-17 DIAGNOSIS — J449 Chronic obstructive pulmonary disease, unspecified: Secondary | ICD-10-CM | POA: Insufficient documentation

## 2016-01-17 LAB — MICROALBUMIN, URINE WAIVED
CREATININE, URINE WAIVED: 100 mg/dL (ref 10–300)
MICROALB, UR WAIVED: 150 mg/L — AB (ref 0–19)
Microalb/Creat Ratio: >300 mg/g — ABNORMAL HIGH (ref <30)

## 2016-01-17 LAB — BAYER DCA HB A1C WAIVED: HB A1C (BAYER DCA - WAIVED): 10.2 % — ABNORMAL HIGH (ref <7.0)

## 2016-01-17 MED ORDER — AMOXICILLIN 875 MG PO TABS: 875.0000 mg | ORAL_TABLET | Freq: Two times a day (BID) | ORAL | 0 refills | Status: DC

## 2016-01-17 NOTE — Assessment & Plan Note (Signed)
Per Dr. Callwood 

## 2016-01-17 NOTE — Progress Notes (Signed)
BP 132/75 (BP Location: Left Arm, Patient Position: Sitting, Cuff Size: Normal)   Pulse 70   Temp 98 F (36.7 C)   Ht 5\' 2"  (1.575 m)   Wt 135 lb 3.2 oz (61.3 kg)   LMP  (LMP Unknown)   SpO2 98%   BMI 24.73 kg/m    Subjective:    Patient ID: Melanie King, female    DOB: 04/27/1945, 70 y.o.   MRN: [id number]  HPI: Melanie F Wassenaar is a 70 y.o. female  Diabetes: Using medications without difficulties.  She is taking 16 u daily No hypoglycemic episodes No hyperglycemic episodes Feet problems:she has neuropathy Blood Sugars averaging:no eye exam within last year Last Hgb A1C: Not sure  Hypertension  Using medications without difficulty Average home BPs   Using medication without problems or lightheadedness No chest pain with exertion or shortness of breath No Edema  Elevated Cholesterol Using medications without problems No Muscle aches  Diet/Exercise:Not good as she felt bad  Sinusitis  Chronicity: 1 month. The problem has been gradually worsening since onset. There has been no fever. The pain is moderate. Associated symptoms include chills, congestion, headaches, sinus pressure and a sore throat. Past treatments include nothing.   OA Has a lot of hand deformities plus DDD in back with lots of chronic pain in her hip.  She takes Hydrocodone TID and would like a refill.    Relevant past medical, surgical, family and social history reviewed and updated as indicated. Interim medical history since our last visit reviewed. Allergies and medications reviewed and updated.  Review of Systems  Constitutional: Positive for chills.  HENT: Positive for congestion, sinus pressure and sore throat.   Neurological: Positive for headaches.    Per HPI unless specifically indicated above     Objective:    BP 132/75 (BP Location: Left Arm, Patient Position: Sitting, Cuff Size: Normal)   Pulse 70   Temp 98 F (36.7 C)   Ht 5\' 2"  (1.575 m)   Wt 135 lb 3.2 oz (61.3 kg)    LMP  (LMP Unknown)   SpO2 98%   BMI 24.73 kg/m   Wt Readings from Last 3 Encounters:  01/17/16 135 lb 3.2 oz (61.3 kg)  11/16/15 139 lb (63 kg)  04/02/15 146 lb (66.2 kg)    Physical Exam  Constitutional: She is oriented to person, place, and time. She appears well-developed and well-nourished. No distress.  HENT:  Head: Normocephalic and atraumatic.  Right Ear: Tympanic membrane and ear canal normal.  Left Ear: Tympanic membrane and ear canal normal.  Nose: No rhinorrhea. Right sinus exhibits maxillary sinus tenderness. Right sinus exhibits no frontal sinus tenderness. Left sinus exhibits maxillary sinus tenderness. Left sinus exhibits no frontal sinus tenderness.  Eyes: Conjunctivae and lids are normal. Right eye exhibits no discharge. Left eye exhibits no discharge. No scleral icterus.  Cardiovascular: Normal rate and regular rhythm.   Pulmonary/Chest: Effort normal and breath sounds normal. No respiratory distress.  Abdominal: Normal appearance. There is no splenomegaly or hepatomegaly.  Musculoskeletal: Normal range of motion.  Neurological: She is alert and oriented to person, place, and time.  Skin: Skin is intact. No rash noted. No pallor.  Psychiatric: She has a normal mood and affect. Her behavior is normal. Judgment and thought content normal.    Results for orders placed or performed during the hospital encounter of 04/02/15  Basic metabolic panel  Result Value Ref Range   Sodium 135 135 - 145 mmol/L  Potassium 3.9 3.5 - 5.1 mmol/L   Chloride 101 101 - 111 mmol/L   CO2 22 22 - 32 mmol/L   Glucose, Bld 257 (H) 65 - 99 mg/dL   BUN 22 (H) 6 - 20 mg/dL   Creatinine, Ser 1.610.96 0.44 - 1.00 mg/dL   Calcium 8.9 8.9 - 09.610.3 mg/dL   GFR calc non Af Amer 59 (L) >60 mL/min   GFR calc Af Amer >60 >60 mL/min   Anion gap 12 5 - 15  CBC  Result Value Ref Range   WBC 12.0 (H) 3.6 - 11.0 K/uL   RBC 4.15 3.80 - 5.20 MIL/uL   Hemoglobin 12.5 12.0 - 16.0 g/dL   HCT 04.536.9 40.935.0 - 81.147.0  %   MCV 89.0 80.0 - 100.0 fL   MCH 30.2 26.0 - 34.0 pg   MCHC 34.0 32.0 - 36.0 g/dL   RDW 91.414.7 (H) 78.211.5 - 95.614.5 %   Platelets 285 150 - 440 K/uL  Troponin I  Result Value Ref Range   Troponin I <0.03 <0.031 ng/mL  Troponin I  Result Value Ref Range   Troponin I <0.03 <0.031 ng/mL      Assessment & Plan:   Problem List Items Addressed This Visit      Unprioritized   Heart disease    Per Dr Juliann Paresallwood      Relevant Medications   furosemide (LASIX) 20 MG tablet   metoprolol succinate (TOPROL-XL) 25 MG 24 hr tablet   amLODipine (NORVASC) 10 MG tablet   aspirin EC 81 MG tablet   valsartan (DIOVAN) 320 MG tablet   rosuvastatin (CRESTOR) 20 MG tablet   nitroGLYCERIN (NITROSTAT) 0.4 MG SL tablet   nitroGLYCERIN (NITRODUR - DOSED IN MG/24 HR) 0.2 mg/hr patch    Other Visit Diagnoses    Need for influenza vaccination    -  Primary   Relevant Orders   Flu vaccine HIGH DOSE PF (Completed)   Poorly controlled type 2 diabetes mellitus (HCC)       Relevant Medications   metFORMIN (GLUCOPHAGE-XR) 750 MG 24 hr tablet   aspirin EC 81 MG tablet   valsartan (DIOVAN) 320 MG tablet   rosuvastatin (CRESTOR) 20 MG tablet   LANTUS SOLOSTAR 100 UNIT/ML Solostar Pen   Other Relevant Orders   Comprehensive metabolic panel   Bayer DCA Hb O1HA1c Waived   Microalbumin, Urine Waived       Follow up plan: No Follow-up on file.

## 2016-01-17 NOTE — Patient Instructions (Addendum)

## 2016-01-17 NOTE — Assessment & Plan Note (Signed)
Hgb A1C was 10.2.  Titrate Lantus by 2u nightly if FBS is greater than 120

## 2016-01-17 NOTE — Assessment & Plan Note (Signed)
Will not refill Hydrocodone.  Refer to pain clinic

## 2016-01-18 ENCOUNTER — Other Ambulatory Visit: Payer: Self-pay | Admitting: Unknown Physician Specialty

## 2016-01-18 ENCOUNTER — Encounter: Payer: Self-pay | Admitting: Unknown Physician Specialty

## 2016-01-18 LAB — COMPREHENSIVE METABOLIC PANEL
A/G RATIO: 1.8 (ref 1.2–2.2)
ALBUMIN: 4.3 g/dL (ref 3.5–4.8)
ALT: 27 IU/L (ref 0–32)
AST: 18 IU/L (ref 0–40)
Alkaline Phosphatase: 95 IU/L (ref 39–117)
BUN / CREAT RATIO: 21 (ref 12–28)
BUN: 16 mg/dL (ref 8–27)
CHLORIDE: 99 mmol/L (ref 96–106)
CO2: 24 mmol/L (ref 18–29)
Calcium: 9 mg/dL (ref 8.7–10.3)
Creatinine, Ser: 0.76 mg/dL (ref 0.57–1.00)
GFR calc non Af Amer: 80 mL/min/{1.73_m2} (ref >59)
GFR, EST AFRICAN AMERICAN: 92 mL/min/{1.73_m2} (ref >59)
Globulin, Total: 2.4 g/dL (ref 1.5–4.5)
Glucose: 183 mg/dL — ABNORMAL HIGH (ref 65–99)
POTASSIUM: 5.2 mmol/L (ref 3.5–5.2)
SODIUM: 139 mmol/L (ref 134–144)
TOTAL PROTEIN: 6.7 g/dL (ref 6.0–8.5)

## 2016-01-19 ENCOUNTER — Telehealth: Payer: Self-pay

## 2016-01-19 DIAGNOSIS — Z1231 Encounter for screening mammogram for malignant neoplasm of breast: Secondary | ICD-10-CM

## 2016-01-19 DIAGNOSIS — E2839 Other primary ovarian failure: Secondary | ICD-10-CM

## 2016-01-19 NOTE — Telephone Encounter (Signed)
Called and let patient know what Melanie King said about the mammogram.

## 2016-01-19 NOTE — Telephone Encounter (Signed)
Patient called in about multiple things. Patient asked abut her insulin. She asked if her blood sugar was over 120 if she was supposed to increase her insulin by 2 units and I told the patient that was correct. Patient then asked about her referral to pain management. I let patient know that Elnita Maxwell entered the referral and our referral coordinator faxed the referral to the pain doctor. They should be calling her with an appointment according to referral notes. Patient also stated that she was overdue for her mammogram and would like a order to be put in. I offered the phone number to [facility] to the patient but she stated that she already had it. Patient asked when she should call and I told the patient tomorrow just to be sure the order is entered today.

## 2016-01-19 NOTE — Telephone Encounter (Signed)
When the mammogram isn't required in [facility], please let them know it may not be covered by insurance.  Thank you for sharing the insulin adjustment.  I ordered her bone density as well

## 2016-01-20 ENCOUNTER — Other Ambulatory Visit: Payer: Self-pay | Admitting: Unknown Physician Specialty

## 2016-01-26 ENCOUNTER — Other Ambulatory Visit: Payer: Self-pay | Admitting: Unknown Physician Specialty

## 2016-01-31 ENCOUNTER — Telehealth: Payer: Self-pay | Admitting: Unknown Physician Specialty

## 2016-01-31 NOTE — Telephone Encounter (Signed)
Tried calling patient to find out what brand of test strips she is using and how often she checks her sugar. Phone call went straight to VM and mailbox was full so I was unable to leave a VM. Will try to call again later.

## 2016-01-31 NOTE — Telephone Encounter (Signed)
Order form filled out, signed by Elnita Maxwell, and faxed to pharmacy.

## 2016-01-31 NOTE — Telephone Encounter (Signed)
Pt called and stated that she uses true track test strips and tests once a day.

## 2016-02-03 ENCOUNTER — Ambulatory Visit: Payer: Medicare (Managed Care)

## 2016-02-04 ENCOUNTER — Other Ambulatory Visit: Payer: Self-pay | Admitting: Unknown Physician Specialty

## 2016-02-08 NOTE — Telephone Encounter (Signed)
Called and spoke to patient. She stated that she needs these 3 medications sent to her pharmacy, [place].

## 2016-02-16 ENCOUNTER — Ambulatory Visit (INDEPENDENT_AMBULATORY_CARE_PROVIDER_SITE_OTHER): Payer: Medicare Other | Admitting: Unknown Physician Specialty

## 2016-02-16 ENCOUNTER — Encounter: Payer: Self-pay | Admitting: Unknown Physician Specialty

## 2016-02-16 DIAGNOSIS — E1165 Type 2 diabetes mellitus with hyperglycemia: Secondary | ICD-10-CM

## 2016-02-16 DIAGNOSIS — M19042 Primary osteoarthritis, left hand: Secondary | ICD-10-CM | POA: Diagnosis not present

## 2016-02-16 DIAGNOSIS — M19041 Primary osteoarthritis, right hand: Secondary | ICD-10-CM | POA: Diagnosis not present

## 2016-02-16 DIAGNOSIS — I10 Essential (primary) hypertension: Secondary | ICD-10-CM

## 2016-02-16 DIAGNOSIS — M65342 Trigger finger, left ring finger: Secondary | ICD-10-CM | POA: Diagnosis not present

## 2016-02-16 DIAGNOSIS — M6530 Trigger finger, unspecified finger: Secondary | ICD-10-CM | POA: Insufficient documentation

## 2016-02-16 NOTE — Assessment & Plan Note (Signed)
Refer to Orthopedics  

## 2016-02-16 NOTE — Progress Notes (Signed)
BP (!) 155/82 (BP Location: Left Arm, Cuff Size: Normal)   Pulse 97   Temp 98.8 F (37.1 C)   Wt 132 lb 9.6 oz (60.1 kg)   LMP  (LMP Unknown)   SpO2 99%   BMI 24.25 kg/m    Subjective:    Patient ID: Melanie King, female    DOB: Jul 12, 1945, 70 y.o.   MRN: [id number]  HPI: Melanie King is a 70 y.o. female  Chief Complaint  Patient presents with  . Diabetes    pt states she has eye exam schedule for January 3rd with [facility]  . Hypertension  . Labs Only    pt states she is interested in Hep C   Diabetes Last visit DM was under poor control and discussed titrating up Lantus from 17 units.  She is up to 30 units.  BS reading are typically low 100's but one day it was 103.  However, she does not take her insulin if her blood sugars are below 110.    Hypertension Using medications without difficulty but did not take her medicine today Average home BPs Not checking  No problems or lightheadedness No chest pain with exertion or shortness of breath No Edema  Sinusitis Amoxicillin helped.    OA Pt is taking Equate and Gabapentin sometimes.  She was taking Hydrocodone but has not been getting refills.  Also noted trigger finger  Relevant past medical, surgical, family and social history reviewed and updated as indicated. Interim medical history since our last visit reviewed. Allergies and medications reviewed and updated.  Review of Systems  Per HPI unless specifically indicated above     Objective:    BP (!) 155/82 (BP Location: Left Arm, Cuff Size: Normal)   Pulse 97   Temp 98.8 F (37.1 C)   Wt 132 lb 9.6 oz (60.1 kg)   LMP  (LMP Unknown)   SpO2 99%   BMI 24.25 kg/m   Wt Readings from Last 3 Encounters:  02/16/16 132 lb 9.6 oz (60.1 kg)  01/17/16 135 lb 3.2 oz (61.3 kg)  11/16/15 139 lb (63 kg)    Physical Exam  Constitutional: She is oriented to person, place, and time. She appears well-developed and well-nourished. No distress.  HENT:  Head:  Normocephalic and atraumatic.  Eyes: Conjunctivae and lids are normal. Right eye exhibits no discharge. Left eye exhibits no discharge. No scleral icterus.  Neck: Normal range of motion. Neck supple. No JVD present. Carotid bruit is not present.  Cardiovascular: Normal rate, regular rhythm and normal heart sounds.   Pulmonary/Chest: Effort normal and breath sounds normal.  Abdominal: Normal appearance. There is no splenomegaly or hepatomegaly.  Musculoskeletal: Normal range of motion.  Left hand 4th finger triggering.  Diffuse arthritic changes bilateral hands  Neurological: She is alert and oriented to person, place, and time.  Skin: Skin is warm, dry and intact. No rash noted. No pallor.  Psychiatric: She has a normal mood and affect. Her behavior is normal. Judgment and thought content normal.    Results for orders placed or performed in visit on 01/17/16  Comprehensive metabolic panel  Result Value Ref Range   Glucose 183 (H) 65 - 99 mg/dL   BUN 16 8 - 27 mg/dL   Creatinine, Ser 0.86 0.57 - 1.00 mg/dL   GFR calc non Af Amer 80 >59 mL/min/1.73   GFR calc Af Amer 92 >59 mL/min/1.73   BUN/Creatinine Ratio 21 12 - 28   Sodium  139 134 - 144 mmol/L   Potassium 5.2 3.5 - 5.2 mmol/L   Chloride 99 96 - 106 mmol/L   CO2 24 18 - 29 mmol/L   Calcium 9.0 8.7 - 10.3 mg/dL   Total Protein 6.7 6.0 - 8.5 g/dL   Albumin 4.3 3.5 - 4.8 g/dL   Globulin, Total 2.4 1.5 - 4.5 g/dL   Albumin/Globulin Ratio 1.8 1.2 - 2.2   Bilirubin Total <0.2 0.0 - 1.2 mg/dL   Alkaline Phosphatase 95 39 - 117 IU/L   AST 18 0 - 40 IU/L   ALT 27 0 - 32 IU/L  Bayer DCA Hb A1c Waived  Result Value Ref Range   Bayer DCA Hb A1c Waived 10.2 (H) <7.0 %  Microalbumin, Urine Waived  Result Value Ref Range   Microalb, Ur Waived 150 (H) 0 - 19 mg/L   Creatinine, Urine Waived 100 10 - 300 mg/dL   Microalb/Creat Ratio >300 (H) <30 mg/g      Assessment & Plan:   Problem List Items Addressed This Visit      Unprioritized    Hypertension    Not optimal but missed medication today      Osteoarthritis of both hands    Discussed using Glucosamine      Poorly controlled type 2 diabetes mellitus (HCC)    Doing better on higher amounts of insulin but too nervous to take if BS is "low" which seems to be 90-120.  Discussed how 24 hour insulin works.  Encouraged to continue insulin as directed at 30 units.        Relevant Medications   metFORMIN (GLUCOPHAGE-XR) 750 MG 24 hr tablet   Trigger finger of left hand    Refer to Orthopedics      Relevant Orders   Ambulatory referral to Orthopedic Surgery       Follow up plan: Return in about 4 weeks (around 03/15/2016).

## 2016-02-16 NOTE — Assessment & Plan Note (Signed)
Doing better on higher amounts of insulin but too nervous to take if BS is "low" which seems to be 90-120.  Discussed how 24 hour insulin works.  Encouraged to continue insulin as directed at 30 units.

## 2016-02-16 NOTE — Assessment & Plan Note (Signed)
Not optimal but missed medication today

## 2016-02-16 NOTE — Patient Instructions (Signed)
Try Glucosamine for arthritis

## 2016-02-16 NOTE — Assessment & Plan Note (Signed)
Discussed using Glucosamine

## 2016-02-25 DIAGNOSIS — F331 Major depressive disorder, recurrent, moderate: Secondary | ICD-10-CM | POA: Diagnosis not present

## 2016-02-25 DIAGNOSIS — F419 Anxiety disorder, unspecified: Secondary | ICD-10-CM | POA: Diagnosis not present

## 2016-03-03 ENCOUNTER — Telehealth: Payer: Self-pay | Admitting: Unknown Physician Specialty

## 2016-03-03 NOTE — Telephone Encounter (Signed)
She will have to be seen

## 2016-03-03 NOTE — Telephone Encounter (Signed)
Please advise 

## 2016-03-03 NOTE — Telephone Encounter (Signed)
Pt called stated she is a nervous wreck. Would like to know if Melanie King can send something to the pharmacy to help her calm her nerves. Pharm is [facility] in [place]. Thanks.

## 2016-03-03 NOTE — Telephone Encounter (Signed)
Message relayed to patient. Verbalized understanding and denied questions. PT will call back to schedule appointment after the weekend.

## 2016-03-08 ENCOUNTER — Telehealth: Payer: Self-pay | Admitting: Unknown Physician Specialty

## 2016-03-08 ENCOUNTER — Ambulatory Visit: Payer: Medicare Other | Admitting: Unknown Physician Specialty

## 2016-03-08 DIAGNOSIS — H16213 Exposure keratoconjunctivitis, bilateral: Secondary | ICD-10-CM | POA: Diagnosis not present

## 2016-03-08 LAB — HM DIABETES EYE EXAM

## 2016-03-08 NOTE — Telephone Encounter (Signed)
Tried calling home number and it kept saying that the call could not be completed as dialed (tried several times). Called the mobile number and left a VM asking for the patient to please return my call.

## 2016-03-08 NOTE — Telephone Encounter (Signed)
Patient missed her appt this morning due to having to take someone to the emergency room but is really in bad condition with her shaking uncontrollably.  She was able to get in Monday to see Elnita Maxwell but she is wanting me to find out if any way possible she could be worked in before.  I told her Elnita Maxwell schedule was full but I would check with her to see what she would recommend.  I was not sure if I should add her to [place] schedule if Elnita Maxwell thought she needed to be seen.    Thank You Clydie Braun

## 2016-03-08 NOTE — Telephone Encounter (Signed)
Routing to provider for advice.

## 2016-03-08 NOTE — Telephone Encounter (Signed)
I don't know why she is shaking so I cannot treat her without seeing her.  Since this is uncontrolled (should she be driving?) she should go to urgent care.

## 2016-03-10 IMAGING — CR DG CHEST 2V
1 series · 2 of 2 positions shown · non-contrast
Comparison: 03/24/2013

CLINICAL DATA: New onset chest pain today

EXAM:
CHEST  2 VIEW

[Series 1: dg chest 2 view · 0.14mm/px · 2 of 2 slices shown]
[im 1/2]
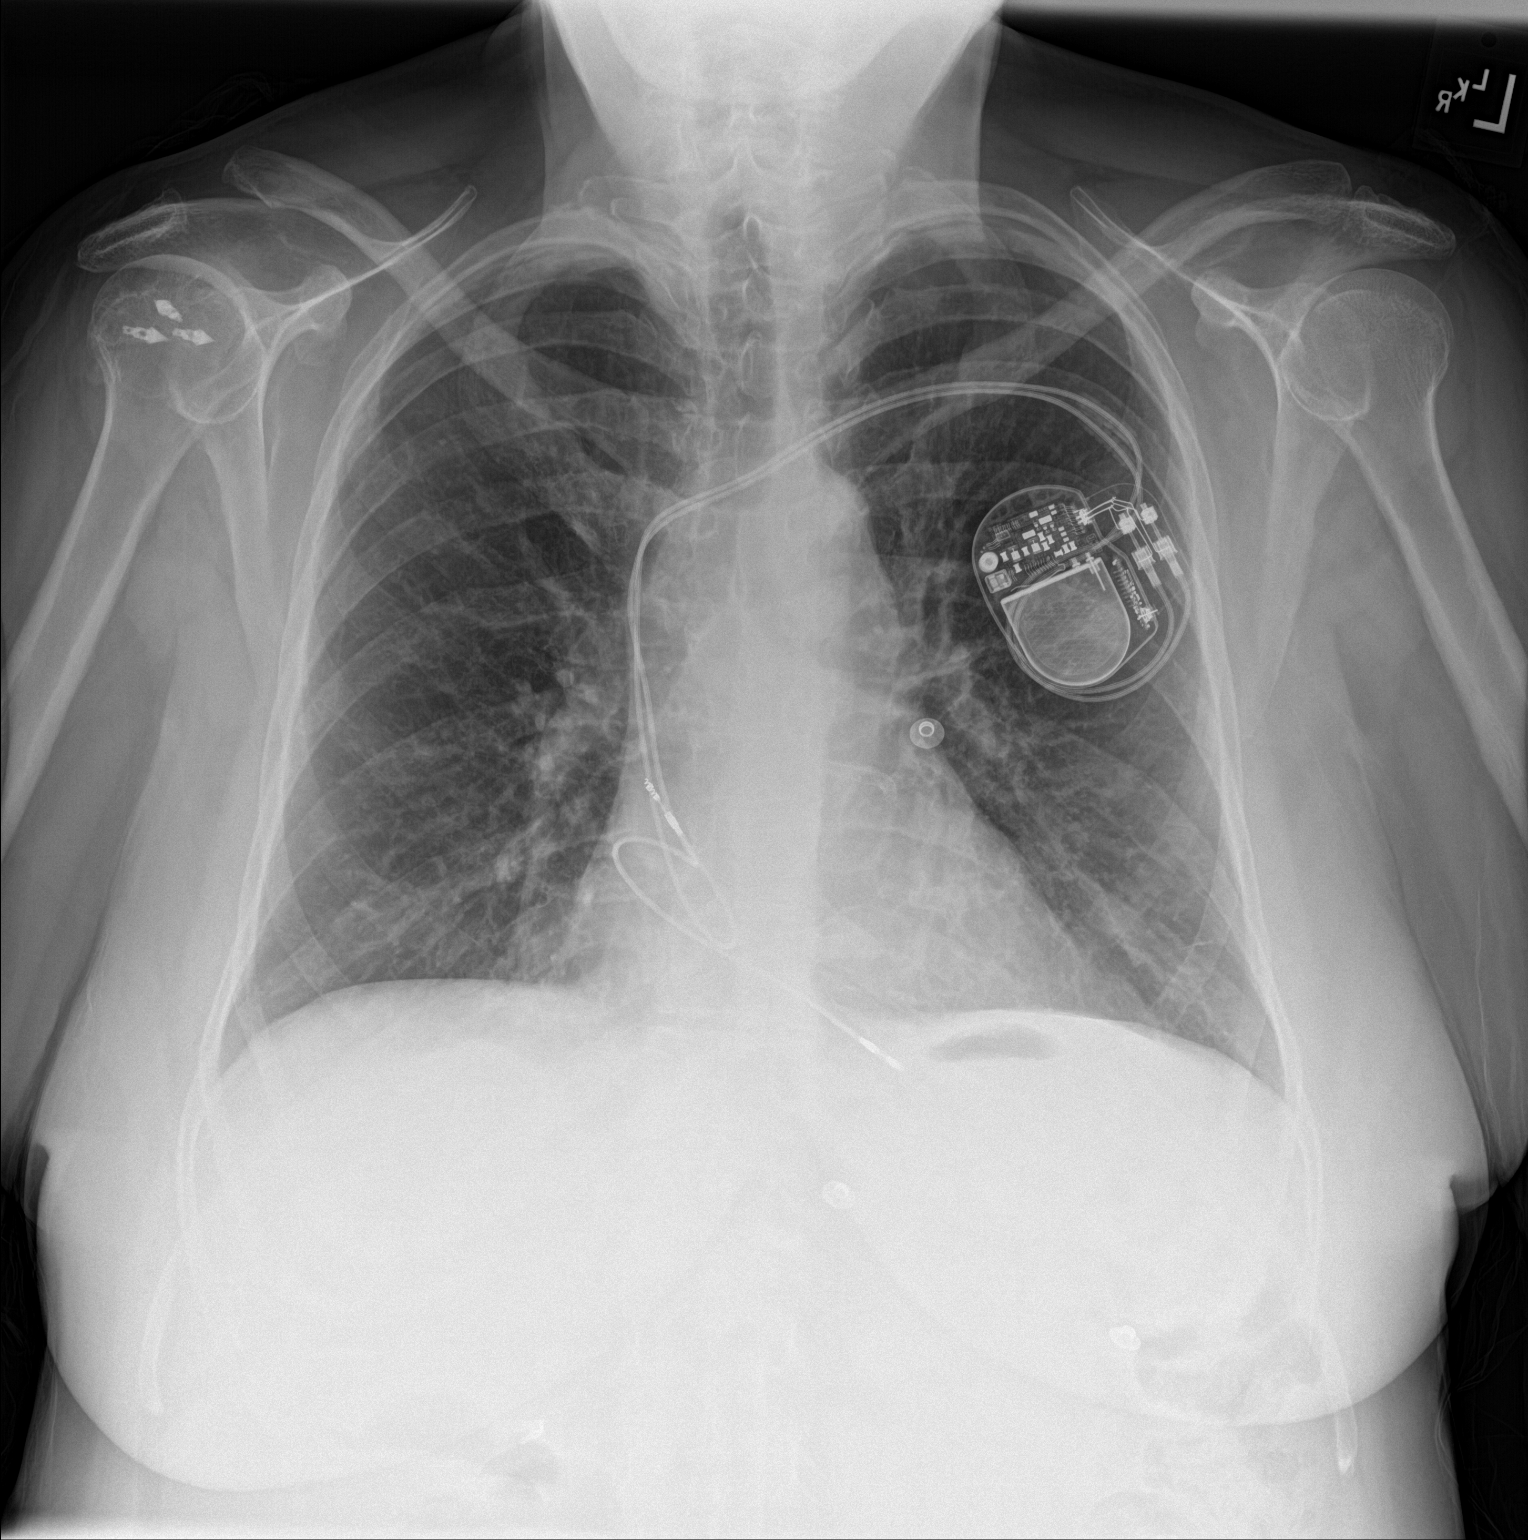
[im 2/2]
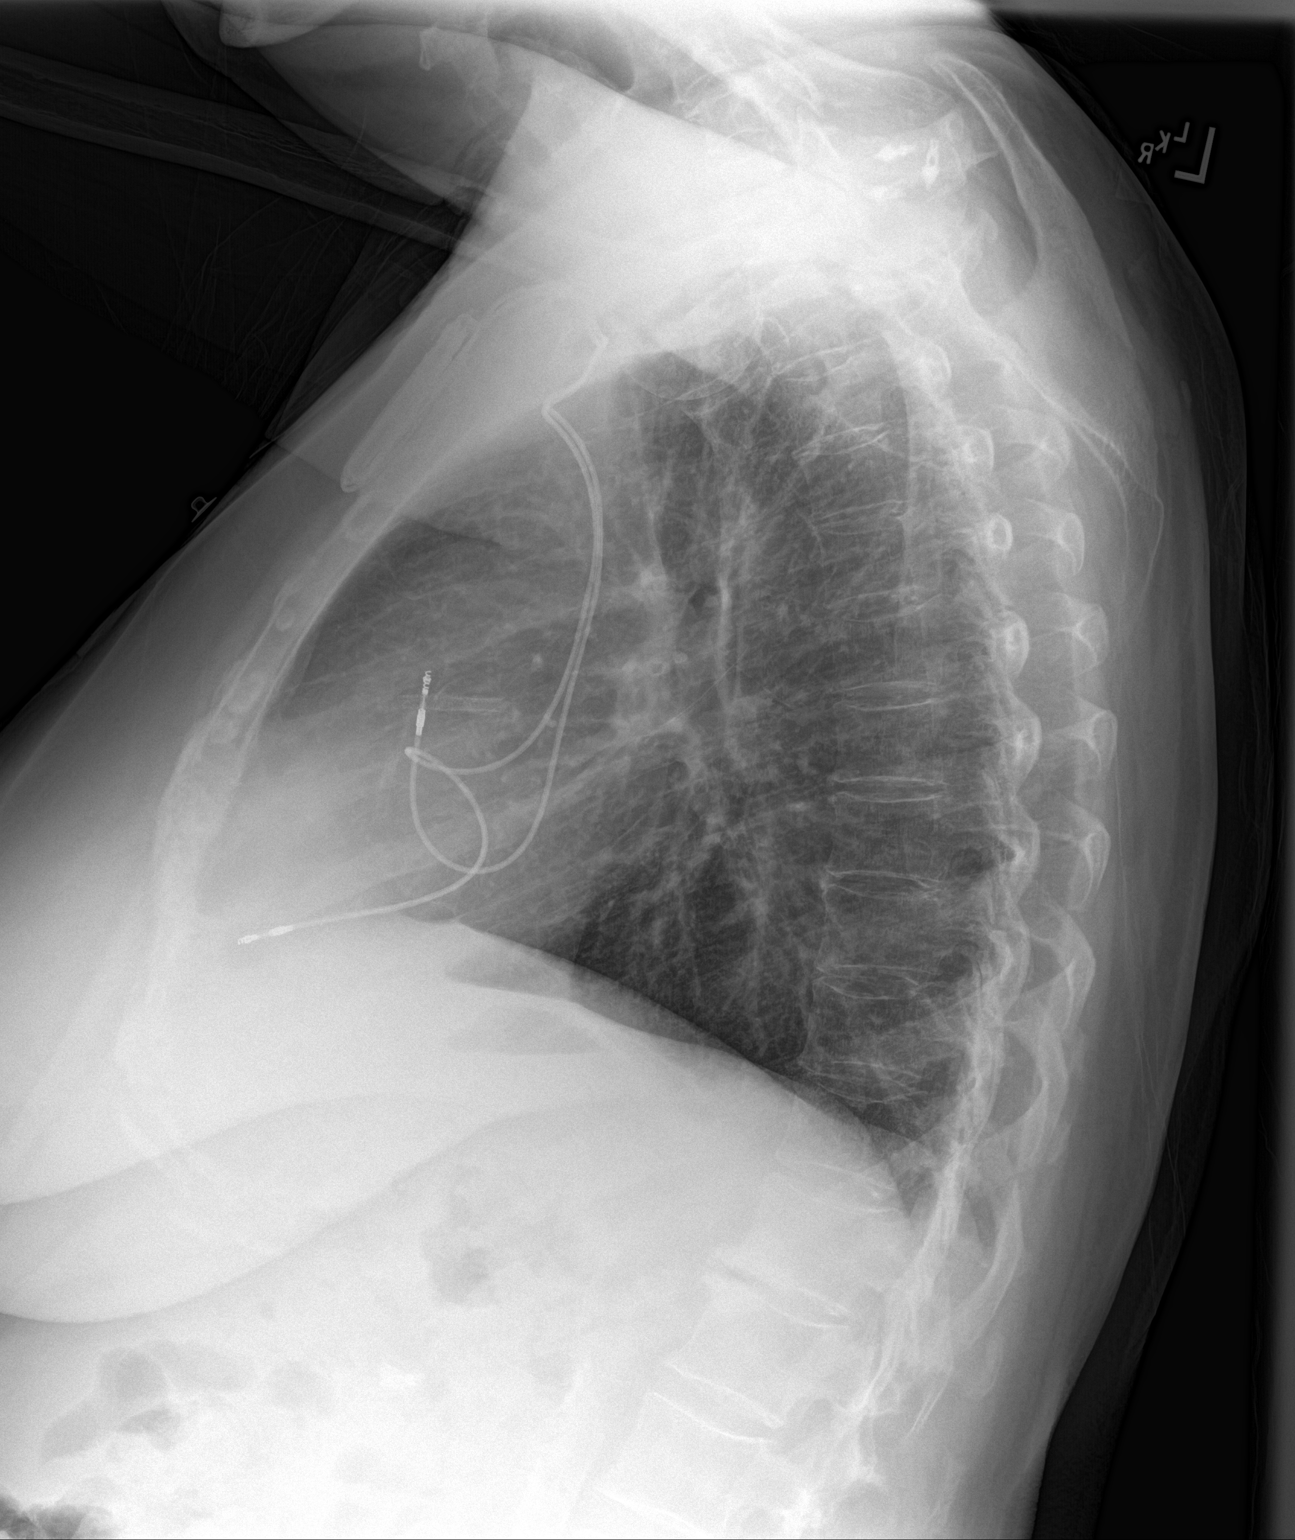

[2 of 2 positions shown; findings below may reference images not displayed]

FINDINGS: Pacing device is again seen and stable. The cardiac shadow is within
normal limits. The lungs are clear bilaterally. Postsurgical changes
in the right shoulder are noted. No other bony abnormality is seen.
IMPRESSION: No acute abnormality noted.

## 2016-03-10 NOTE — Telephone Encounter (Signed)
Called and spoke to patient. I let her know what Melanie King said. Patient stated that she was doing a little bit better and thinks she will be OK until Monday. I advised patient to please go to Urgent Care or the ER if the shaking gets worse over the weekend.

## 2016-03-13 ENCOUNTER — Ambulatory Visit (INDEPENDENT_AMBULATORY_CARE_PROVIDER_SITE_OTHER): Payer: Medicare Other | Admitting: Family Medicine

## 2016-03-13 ENCOUNTER — Encounter: Payer: Self-pay | Admitting: Family Medicine

## 2016-03-13 ENCOUNTER — Ambulatory Visit: Payer: Medicare Other | Admitting: Unknown Physician Specialty

## 2016-03-13 ENCOUNTER — Other Ambulatory Visit: Payer: Self-pay

## 2016-03-13 VITALS — BP 138/74 | HR 76 | Temp 97.5°F | Wt 131.0 lb

## 2016-03-13 DIAGNOSIS — M19041 Primary osteoarthritis, right hand: Secondary | ICD-10-CM | POA: Diagnosis not present

## 2016-03-13 DIAGNOSIS — M19042 Primary osteoarthritis, left hand: Secondary | ICD-10-CM

## 2016-03-13 DIAGNOSIS — R251 Tremor, unspecified: Secondary | ICD-10-CM | POA: Diagnosis not present

## 2016-03-13 MED ORDER — ASPIRIN EC 81 MG PO TBEC: 81.0000 mg | DELAYED_RELEASE_TABLET | Freq: Every day | ORAL | 12 refills | Status: DC

## 2016-03-13 MED ORDER — RANITIDINE HCL 150 MG PO TABS: 150.0000 mg | ORAL_TABLET | Freq: Two times a day (BID) | ORAL | 0 refills | Status: DC

## 2016-03-13 MED ORDER — VALSARTAN 320 MG PO TABS: 320.0000 mg | ORAL_TABLET | Freq: Every day | ORAL | 0 refills | Status: DC

## 2016-03-13 MED ORDER — TRAMADOL HCL 50 MG PO TABS: 50.0000 mg | ORAL_TABLET | Freq: Three times a day (TID) | ORAL | 0 refills | Status: DC | PRN

## 2016-03-13 MED ORDER — DOCUSATE SODIUM 100 MG PO CAPS: 100.0000 mg | ORAL_CAPSULE | Freq: Every day | ORAL | 0 refills | Status: DC

## 2016-03-13 MED ORDER — METOPROLOL TARTRATE 25 MG PO TABS: 75.0000 mg | ORAL_TABLET | Freq: Two times a day (BID) | ORAL | 0 refills | Status: DC

## 2016-03-13 MED ORDER — AMLODIPINE BESYLATE 10 MG PO TABS: 10.0000 mg | ORAL_TABLET | Freq: Every day | ORAL | 0 refills | Status: DC

## 2016-03-13 MED ORDER — FUROSEMIDE 20 MG PO TABS
ORAL_TABLET | ORAL | 0 refills | Status: DC
Start: 2016-03-13 — End: 2016-06-01

## 2016-03-13 MED ORDER — PRIMIDONE 50 MG PO TABS: 100.0000 mg | ORAL_TABLET | Freq: Every morning | ORAL | 0 refills | Status: DC

## 2016-03-13 NOTE — Assessment & Plan Note (Signed)
Recommended she follow up with orthopedics to discuss further management of her trigger finger and arthritis. Still awaiting pain management appt, will give her small supply of tramadol to help with pain. Risks and cautions discussed.

## 2016-03-13 NOTE — Progress Notes (Signed)
BP 138/74   Pulse 76   Temp 97.5 F (36.4 C)   Wt 131 lb (59.4 kg)   LMP  (LMP Unknown)   SpO2 99%   BMI 23.96 kg/m    Subjective:    Patient ID: Melanie King, female    DOB: October 08, 1945, 72 y.o.   MRN: [id number]  HPI: Melanie King is a 72 y.o. female  Chief Complaint  Patient presents with  . Shaking    going on bad for about 3 months. She's been on the Primidone for 20+ years. She states if she's busy, it's worse. She wants a referral to neurology.  . Medication Refill    She wants refills on Lasix, Aspirin, Metoprolol, Docusate, Primidone, Amlodipine, Valsartan, and Rantidine.   Ongoing tremor x 20 years. Saw Neurology at that time and was placed on Primidone. Has been on medication ever since. States the tremor has worsened the past 3 months and is starting to interfere with ADLs including feeding and dressing. Worse with emotion and action, but occurring even at rest. Having to drink through a straw now to reduce spilling. No noted memory issues, but is having some depression that stems from her inability to take good care of herself anymore. Seeing therapist regularly for this. She is very concerned that she will have to go to assisted living soon. Would like to see the Neurologist again.   She also feels like her pain is contributing to her stress and and wanting a refill on her hydrocodone that she was previously on. Having severe arthritis flares, especially in hands. Awaiting orthopedic follow-up for severe trigger finger. Awaiting an appt with pain management for chronic pain medication. States this helps her so much and she has been struggling without it.   Past Medical History:  Diagnosis Date  . Arthritis   . DDD (degenerative disc disease), cervical   . DDD (degenerative disc disease), lumbar   . Depression   . Diabetes mellitus without complication (HCC)   . GERD (gastroesophageal reflux disease)   . Hyperlipidemia   . Hypertension   . Osteoarthritis   .  Pacemaker    Social History   Social History  . Marital status: Single    Spouse name: N/A  . Number of children: N/A  . Years of education: N/A   Occupational History  . Not on file.   Social History Main Topics  . Smoking status: Current Every Day Smoker    Packs/day: 0.75    Years: 50.00    Types: Cigarettes  . Smokeless tobacco: Never Used  . Alcohol use No  . Drug use: No  . Sexual activity: No   Other Topics Concern  . Not on file   Social History Narrative  . No narrative on file    Relevant past medical, surgical, family and social history reviewed and updated as indicated. Interim medical history since our last visit reviewed. Allergies and medications reviewed and updated.  Review of Systems  Constitutional: Negative.   HENT: Negative.   Respiratory: Negative.   Gastrointestinal: Negative.   Genitourinary: Negative.   Musculoskeletal: Positive for arthralgias.  Neurological: Positive for tremors.  Psychiatric/Behavioral: Positive for dysphoric mood.    Per HPI unless specifically indicated above     Objective:    BP 138/74   Pulse 76   Temp 97.5 F (36.4 C)   Wt 131 lb (59.4 kg)   LMP  (LMP Unknown)   SpO2 99%   BMI 23.96  kg/m   Wt Readings from Last 3 Encounters:  03/13/16 131 lb (59.4 kg)  02/16/16 132 lb 9.6 oz (60.1 kg)  01/17/16 135 lb 3.2 oz (61.3 kg)    Physical Exam  Constitutional: She appears well-developed and well-nourished. No distress.  HENT:  Head: Atraumatic.  Eyes: Conjunctivae are normal. Pupils are equal, round, and reactive to light. No scleral icterus.  Neck: Normal range of motion. Neck supple.  Pulmonary/Chest: Effort normal. No respiratory distress.  Musculoskeletal:  Left hand 4th digit triggering   Neurological: She is alert.  Persistent tremor present, worse with intention but present at rest Strength and sensation intact No gross focal deficits noted  Skin: Skin is warm and dry.  Psychiatric: She has a  normal mood and affect. Her behavior is normal. Thought content normal.  Nursing note and vitals reviewed.      Assessment & Plan:   Problem List Items Addressed This Visit      Musculoskeletal and Integument   Osteoarthritis of both hands    Recommended she follow up with orthopedics to discuss further management of her trigger finger and arthritis. Still awaiting pain management appt, will give her small supply of tramadol to help with pain. Risks and cautions discussed.       Relevant Medications   traMADol (ULTRAM) 50 MG tablet    Other Visit Diagnoses    Tremor    -  Primary   Neurology referral placed. Await their guidance regarding next steps. Continue primodone as before until this appt.   Relevant Orders   Ambulatory referral to Neurology       Follow up plan: Return for as scheduled.

## 2016-03-13 NOTE — Patient Instructions (Signed)
Follow up as scheduled next week

## 2016-03-14 DIAGNOSIS — M19042 Primary osteoarthritis, left hand: Secondary | ICD-10-CM | POA: Diagnosis not present

## 2016-03-20 ENCOUNTER — Ambulatory Visit (INDEPENDENT_AMBULATORY_CARE_PROVIDER_SITE_OTHER): Payer: Medicare Other | Admitting: Unknown Physician Specialty

## 2016-03-20 ENCOUNTER — Encounter: Payer: Self-pay | Admitting: Unknown Physician Specialty

## 2016-03-20 VITALS — BP 147/78 | HR 87 | Temp 98.0°F | Wt 132.0 lb

## 2016-03-20 DIAGNOSIS — I10 Essential (primary) hypertension: Secondary | ICD-10-CM

## 2016-03-20 DIAGNOSIS — R05 Cough: Secondary | ICD-10-CM | POA: Diagnosis not present

## 2016-03-20 DIAGNOSIS — B9789 Other viral agents as the cause of diseases classified elsewhere: Secondary | ICD-10-CM | POA: Diagnosis not present

## 2016-03-20 DIAGNOSIS — E1165 Type 2 diabetes mellitus with hyperglycemia: Secondary | ICD-10-CM | POA: Diagnosis not present

## 2016-03-20 DIAGNOSIS — J069 Acute upper respiratory infection, unspecified: Secondary | ICD-10-CM

## 2016-03-20 DIAGNOSIS — R059 Cough, unspecified: Secondary | ICD-10-CM

## 2016-03-20 LAB — VERITOR FLU A/B WAIVED
INFLUENZA B: NEGATIVE
Influenza A: NEGATIVE

## 2016-03-20 MED ORDER — METFORMIN HCL ER 750 MG PO TB24: 1500.0000 mg | ORAL_TABLET | Freq: Every day | ORAL | 1 refills | Status: DC

## 2016-03-20 MED ORDER — METOPROLOL TARTRATE 25 MG PO TABS: 75.0000 mg | ORAL_TABLET | Freq: Two times a day (BID) | ORAL | 0 refills | Status: DC

## 2016-03-20 MED ORDER — LEVOTHYROXINE SODIUM 125 MCG PO TABS: 125.0000 ug | ORAL_TABLET | Freq: Every day | ORAL | 1 refills | Status: DC

## 2016-03-20 MED ORDER — MONTELUKAST SODIUM 10 MG PO TABS: 10.0000 mg | ORAL_TABLET | Freq: Every day | ORAL | 3 refills | Status: DC

## 2016-03-20 MED ORDER — RANITIDINE HCL 150 MG PO TABS: 150.0000 mg | ORAL_TABLET | Freq: Two times a day (BID) | ORAL | 1 refills | Status: DC

## 2016-03-20 MED ORDER — ROSUVASTATIN CALCIUM 20 MG PO TABS: 20.0000 mg | ORAL_TABLET | Freq: Every day | ORAL | 1 refills | Status: DC

## 2016-03-20 MED ORDER — VALSARTAN 320 MG PO TABS: 320.0000 mg | ORAL_TABLET | Freq: Every day | ORAL | 1 refills | Status: DC

## 2016-03-20 MED ORDER — LANTUS SOLOSTAR 100 UNIT/ML ~~LOC~~ SOPN: 20.0000 [IU] | PEN_INJECTOR | Freq: Every day | SUBCUTANEOUS | 12 refills | Status: DC

## 2016-03-20 NOTE — Progress Notes (Signed)
BP (!) 147/78 (BP Location: Left Arm, Patient Position: Sitting, Cuff Size: Normal)   Pulse 87   Temp 98 F (36.7 C)   Wt 132 lb (59.9 kg)   LMP  (LMP Unknown)   SpO2 97%   BMI 24.14 kg/m    Subjective:    Patient ID: Melanie King, female    DOB: Sep 08, 1945, 71 y.o.   MRN: [id number]  HPI: Melanie King is a 71 y.o. female  Chief Complaint  Patient presents with  . Diabetes    pt states she has been taking 20 units of lantus and needs a refill  . Hypertension  . Hyperlipidemia  . URI    pt states she has had cough, congestion, chills, fatigue, and burning throat since Friday   Diabetes: Taking 20 units of her insulin.  Tried titrating up but found she got hypoglycemic No hypoglycemic episodes If she goes higher than this dose No hyperglycemic episodes Feet problems: none Blood Sugars averaging: 80-120 if she is eating right Last Hgb A1C was 11.4  Hypertension  Using medications without difficulty Average home BPs Not checking  Using medication without problems or lightheadedness No chest pain with exertion or shortness of breath No Edema  Elevated Cholesterol Using medications without problems No Muscle aches  Diet/Exercise: Trying to eat well.  Not exercising due to low back pain.    OA Seeing Orthopedics about 1 week ago.  Triggering of finger not improved with Cortisone.    URI   This is a new problem. Episode onset: 2 days. The problem has been gradually improving. There has been no fever. Associated symptoms include coughing, rhinorrhea, sneezing and a sore throat.     Relevant past medical, surgical, family and social history reviewed and updated as indicated. Interim medical history since our last visit reviewed. Allergies and medications reviewed and updated.  Review of Systems  HENT: Positive for rhinorrhea, sneezing and sore throat.   Respiratory: Positive for cough.     Per HPI unless specifically indicated above     Objective:      BP (!) 147/78 (BP Location: Left Arm, Patient Position: Sitting, Cuff Size: Normal)   Pulse 87   Temp 98 F (36.7 C)   Wt 132 lb (59.9 kg)   LMP  (LMP Unknown)   SpO2 97%   BMI 24.14 kg/m   Wt Readings from Last 3 Encounters:  03/20/16 132 lb (59.9 kg)  03/13/16 131 lb (59.4 kg)  02/16/16 132 lb 9.6 oz (60.1 kg)    Physical Exam  Constitutional: She is oriented to person, place, and time. She appears well-developed and well-nourished. No distress.  HENT:  Head: Normocephalic and atraumatic.  Right Ear: Tympanic membrane and ear canal normal.  Left Ear: Tympanic membrane and ear canal normal.  Nose: Rhinorrhea present. Right sinus exhibits no maxillary sinus tenderness and no frontal sinus tenderness. Left sinus exhibits no maxillary sinus tenderness and no frontal sinus tenderness.  Mouth/Throat: Mucous membranes are normal. Posterior oropharyngeal erythema present.  Eyes: Conjunctivae and lids are normal. Right eye exhibits no discharge. Left eye exhibits no discharge. No scleral icterus.  Neck: Normal range of motion. Neck supple. No JVD present. Carotid bruit is not present.  Cardiovascular: Normal rate, regular rhythm and normal heart sounds.   Pulmonary/Chest: Effort normal and breath sounds normal. No respiratory distress.  Abdominal: Normal appearance. There is no splenomegaly or hepatomegaly.  Musculoskeletal: Normal range of motion.  Neurological: She is alert and oriented  to person, place, and time.  Skin: Skin is warm, dry and intact. No rash noted. No pallor.  Psychiatric: She has a normal mood and affect. Her behavior is normal. Judgment and thought content normal.    Results for orders placed or performed in visit on 01/17/16  Comprehensive metabolic panel  Result Value Ref Range   Glucose 183 (H) 65 - 99 mg/dL   BUN 16 8 - 27 mg/dL   Creatinine, Ser 1.61 0.57 - 1.00 mg/dL   GFR calc non Af Amer 80 >59 mL/min/1.73   GFR calc Af Amer 92 >59 mL/min/1.73    BUN/Creatinine Ratio 21 12 - 28   Sodium 139 134 - 144 mmol/L   Potassium 5.2 3.5 - 5.2 mmol/L   Chloride 99 96 - 106 mmol/L   CO2 24 18 - 29 mmol/L   Calcium 9.0 8.7 - 10.3 mg/dL   Total Protein 6.7 6.0 - 8.5 g/dL   Albumin 4.3 3.5 - 4.8 g/dL   Globulin, Total 2.4 1.5 - 4.5 g/dL   Albumin/Globulin Ratio 1.8 1.2 - 2.2   Bilirubin Total <0.2 0.0 - 1.2 mg/dL   Alkaline Phosphatase 95 39 - 117 IU/L   AST 18 0 - 40 IU/L   ALT 27 0 - 32 IU/L  Bayer DCA Hb A1c Waived  Result Value Ref Range   Bayer DCA Hb A1c Waived 10.2 (H) <7.0 %  Microalbumin, Urine Waived  Result Value Ref Range   Microalb, Ur Waived 150 (H) 0 - 19 mg/L   Creatinine, Urine Waived 100 10 - 300 mg/dL   Microalb/Creat Ratio >300 (H) <30 mg/g      Assessment & Plan:   Problem List Items Addressed This Visit      Unprioritized   Hypertension    A little high today.  Will check next month      Relevant Medications   metoprolol tartrate (LOPRESSOR) 25 MG tablet   rosuvastatin (CRESTOR) 20 MG tablet   valsartan (DIOVAN) 320 MG tablet   Poorly controlled type 2 diabetes mellitus (HCC)    Home numbers are good today.  Will continue present and f/u next week      Relevant Medications   metFORMIN (GLUCOPHAGE-XR) 750 MG 24 hr tablet   rosuvastatin (CRESTOR) 20 MG tablet   valsartan (DIOVAN) 320 MG tablet   LANTUS SOLOSTAR 100 UNIT/ML Solostar Pen    Other Visit Diagnoses    Cough    -  Primary   Relevant Orders   Veritor Flu A/B Waived   Viral upper respiratory tract infection       discussed supportive visit.  Encouraged rest and fluids       Follow up plan: Return in about 4 weeks (around 04/17/2016).

## 2016-03-20 NOTE — Assessment & Plan Note (Signed)
A little high today.  Will check next month

## 2016-03-20 NOTE — Assessment & Plan Note (Addendum)
Home numbers are good today.  Will continue present and f/u next week

## 2016-03-22 ENCOUNTER — Ambulatory Visit: Payer: Medicare (Managed Care)

## 2016-03-24 ENCOUNTER — Telehealth: Payer: Self-pay | Admitting: Unknown Physician Specialty

## 2016-03-24 ENCOUNTER — Other Ambulatory Visit: Payer: Self-pay | Admitting: Family Medicine

## 2016-03-24 MED ORDER — TRAMADOL HCL 50 MG PO TABS: 50.0000 mg | ORAL_TABLET | Freq: Three times a day (TID) | ORAL | 0 refills | Status: DC | PRN

## 2016-03-24 NOTE — Telephone Encounter (Signed)
[facility] [place] and patient notified.

## 2016-03-24 NOTE — Telephone Encounter (Signed)
Routing to provider. Tramadol is in current medication list.

## 2016-03-24 NOTE — Telephone Encounter (Signed)
Pt called would like to know if Melanie King can call in an RX for Tramadol for her. [facility] [place]. Thanks.

## 2016-03-27 ENCOUNTER — Telehealth: Payer: Self-pay | Admitting: Unknown Physician Specialty

## 2016-03-27 DIAGNOSIS — L851 Acquired keratosis [keratoderma] palmaris et plantaris: Secondary | ICD-10-CM | POA: Diagnosis not present

## 2016-03-27 DIAGNOSIS — B351 Tinea unguium: Secondary | ICD-10-CM | POA: Diagnosis not present

## 2016-03-27 DIAGNOSIS — E1142 Type 2 diabetes mellitus with diabetic polyneuropathy: Secondary | ICD-10-CM | POA: Diagnosis not present

## 2016-03-27 NOTE — Telephone Encounter (Signed)
Called pharmacy. They said that they did not receive the tramadol rx I faxed on Friday so I called it in for the patient. Will call and let her know.

## 2016-03-27 NOTE — Telephone Encounter (Signed)
[facility] said [facility] [place] does not have the prescription yet for the patients Tramadol  She knew you called and faxed it in on Friday.  Thank You  Clydie Braun

## 2016-03-27 NOTE — Telephone Encounter (Signed)
Patient notified

## 2016-03-28 ENCOUNTER — Telehealth: Payer: Self-pay | Admitting: Unknown Physician Specialty

## 2016-03-28 DIAGNOSIS — F411 Generalized anxiety disorder: Secondary | ICD-10-CM | POA: Diagnosis not present

## 2016-03-29 ENCOUNTER — Telehealth: Payer: Self-pay | Admitting: Unknown Physician Specialty

## 2016-03-29 MED ORDER — AMOXICILLIN-POT CLAVULANATE 875-125 MG PO TABS: 1.0000 | ORAL_TABLET | Freq: Two times a day (BID) | ORAL | 0 refills | Status: DC

## 2016-03-29 NOTE — Telephone Encounter (Signed)
Routing to provider  

## 2016-03-29 NOTE — Telephone Encounter (Signed)
Patient called back to see if Elnita Maxwell would call her in something for her sinus congestion.  She is feeling really bad and is hoping Elnita Maxwell would be able to do this for her today ASAP.  Thank You Clydie Braun  [facility] [place]

## 2016-04-12 DIAGNOSIS — R251 Tremor, unspecified: Secondary | ICD-10-CM | POA: Diagnosis not present

## 2016-04-13 ENCOUNTER — Other Ambulatory Visit: Payer: Self-pay | Admitting: Unknown Physician Specialty

## 2016-04-14 MED ORDER — FLUTICASONE PROPIONATE 50 MCG/ACT NA SUSP: 1.0000 | Freq: Every day | NASAL | 12 refills | Status: DC

## 2016-04-14 NOTE — Telephone Encounter (Signed)
[facility] because according to chart, patient should have refills on this medication. Harlow Asa stated that they transferred all of patient's prescriptions to [facility] [place] at the beginning of the year. So [facility] [place] and they stated that they did not have any refills on file for the patient. Can we please send in flonase to [facility] [place] for patient?

## 2016-04-19 DIAGNOSIS — K219 Gastro-esophageal reflux disease without esophagitis: Secondary | ICD-10-CM | POA: Diagnosis not present

## 2016-04-19 DIAGNOSIS — J449 Chronic obstructive pulmonary disease, unspecified: Secondary | ICD-10-CM | POA: Diagnosis not present

## 2016-04-19 DIAGNOSIS — E784 Other hyperlipidemia: Secondary | ICD-10-CM | POA: Diagnosis not present

## 2016-04-19 DIAGNOSIS — I495 Sick sinus syndrome: Secondary | ICD-10-CM | POA: Diagnosis not present

## 2016-04-19 DIAGNOSIS — I2510 Atherosclerotic heart disease of native coronary artery without angina pectoris: Secondary | ICD-10-CM | POA: Diagnosis not present

## 2016-04-19 DIAGNOSIS — E119 Type 2 diabetes mellitus without complications: Secondary | ICD-10-CM | POA: Diagnosis not present

## 2016-04-19 DIAGNOSIS — I209 Angina pectoris, unspecified: Secondary | ICD-10-CM | POA: Diagnosis not present

## 2016-04-19 DIAGNOSIS — R0602 Shortness of breath: Secondary | ICD-10-CM | POA: Diagnosis not present

## 2016-04-19 DIAGNOSIS — I10 Essential (primary) hypertension: Secondary | ICD-10-CM | POA: Diagnosis not present

## 2016-04-19 DIAGNOSIS — F17200 Nicotine dependence, unspecified, uncomplicated: Secondary | ICD-10-CM | POA: Diagnosis not present

## 2016-04-19 DIAGNOSIS — F411 Generalized anxiety disorder: Secondary | ICD-10-CM | POA: Diagnosis not present

## 2016-04-19 DIAGNOSIS — R296 Repeated falls: Secondary | ICD-10-CM | POA: Diagnosis not present

## 2016-04-21 ENCOUNTER — Ambulatory Visit: Payer: Medicare Other | Admitting: Unknown Physician Specialty

## 2016-04-25 ENCOUNTER — Other Ambulatory Visit: Payer: Self-pay | Admitting: Family Medicine

## 2016-05-01 ENCOUNTER — Other Ambulatory Visit: Payer: Self-pay | Admitting: Unknown Physician Specialty

## 2016-05-01 ENCOUNTER — Ambulatory Visit
Admission: RE | Admit: 2016-05-01 | Discharge: 2016-05-01 | Disposition: A | Discharge status: Home / Self Care | Payer: Medicare Other | Source: Ambulatory Visit | Attending: Unknown Physician Specialty | Admitting: Unknown Physician Specialty

## 2016-05-01 ENCOUNTER — Other Ambulatory Visit: Payer: Self-pay | Admitting: Family Medicine

## 2016-05-01 DIAGNOSIS — Z1231 Encounter for screening mammogram for malignant neoplasm of breast: Secondary | ICD-10-CM | POA: Diagnosis not present

## 2016-05-01 DIAGNOSIS — E2839 Other primary ovarian failure: Secondary | ICD-10-CM | POA: Insufficient documentation

## 2016-05-01 NOTE — Telephone Encounter (Signed)
Your patient 

## 2016-05-03 ENCOUNTER — Encounter: Payer: Self-pay | Admitting: Unknown Physician Specialty

## 2016-05-06 ENCOUNTER — Other Ambulatory Visit: Payer: Self-pay | Admitting: Unknown Physician Specialty

## 2016-05-11 ENCOUNTER — Other Ambulatory Visit: Payer: Self-pay | Admitting: Family Medicine

## 2016-05-11 NOTE — Telephone Encounter (Signed)
Routing to provider  

## 2016-05-15 ENCOUNTER — Ambulatory Visit: Payer: Medicare Other | Admitting: Unknown Physician Specialty

## 2016-05-16 DIAGNOSIS — I495 Sick sinus syndrome: Secondary | ICD-10-CM | POA: Diagnosis not present

## 2016-05-17 ENCOUNTER — Telehealth: Payer: Self-pay | Admitting: Unknown Physician Specialty

## 2016-05-17 NOTE — Telephone Encounter (Signed)
Pt called and stated that she would like an order for incontinence undergarments in size small sent to [facility]. Fax number is [phone].

## 2016-05-18 NOTE — Telephone Encounter (Signed)
Sounds good, OK to type and I'll sign.

## 2016-05-18 NOTE — Telephone Encounter (Signed)
Routing to provider. Is it OK to type order for you to sign?

## 2016-05-18 NOTE — Telephone Encounter (Signed)
Letter generated and signed by Dr. Laural Benes.

## 2016-05-19 NOTE — Telephone Encounter (Signed)
Order faxed to [facility] as patient requested. Will call and let her know that it was sent.

## 2016-05-19 NOTE — Telephone Encounter (Signed)
Called and left patient a VM letting her know that order has been faxed as requested.

## 2016-05-23 NOTE — Telephone Encounter (Signed)
Error note

## 2016-05-24 DIAGNOSIS — R251 Tremor, unspecified: Secondary | ICD-10-CM | POA: Diagnosis not present

## 2016-05-27 DIAGNOSIS — R251 Tremor, unspecified: Secondary | ICD-10-CM | POA: Insufficient documentation

## 2016-05-29 DIAGNOSIS — F458 Other somatoform disorders: Secondary | ICD-10-CM | POA: Diagnosis not present

## 2016-05-29 DIAGNOSIS — F341 Dysthymic disorder: Secondary | ICD-10-CM | POA: Diagnosis not present

## 2016-05-29 DIAGNOSIS — F411 Generalized anxiety disorder: Secondary | ICD-10-CM | POA: Diagnosis not present

## 2016-05-31 ENCOUNTER — Encounter: Payer: Self-pay | Admitting: Unknown Physician Specialty

## 2016-05-31 ENCOUNTER — Ambulatory Visit (INDEPENDENT_AMBULATORY_CARE_PROVIDER_SITE_OTHER): Payer: Medicare Other | Admitting: Unknown Physician Specialty

## 2016-05-31 VITALS — BP 142/83 | HR 81 | Temp 97.5°F | Ht 61.8 in | Wt 136.6 lb

## 2016-05-31 DIAGNOSIS — G894 Chronic pain syndrome: Secondary | ICD-10-CM

## 2016-05-31 DIAGNOSIS — R251 Tremor, unspecified: Secondary | ICD-10-CM | POA: Diagnosis not present

## 2016-05-31 DIAGNOSIS — E1165 Type 2 diabetes mellitus with hyperglycemia: Secondary | ICD-10-CM | POA: Diagnosis not present

## 2016-05-31 DIAGNOSIS — E782 Mixed hyperlipidemia: Secondary | ICD-10-CM

## 2016-05-31 DIAGNOSIS — I10 Essential (primary) hypertension: Secondary | ICD-10-CM

## 2016-05-31 LAB — BAYER DCA HB A1C WAIVED: HB A1C (BAYER DCA - WAIVED): 8.4 % — ABNORMAL HIGH (ref <7.0)

## 2016-05-31 MED ORDER — METOPROLOL TARTRATE 25 MG PO TABS
75.0000 mg | ORAL_TABLET | Freq: Two times a day (BID) | ORAL | 0 refills | Status: DC
Start: 2016-05-31 — End: 2017-03-30

## 2016-05-31 MED ORDER — METFORMIN HCL ER 750 MG PO TB24: 1500.0000 mg | ORAL_TABLET | Freq: Every day | ORAL | 1 refills | Status: DC

## 2016-05-31 MED ORDER — TRAMADOL HCL 50 MG PO TABS: 50.0000 mg | ORAL_TABLET | Freq: Three times a day (TID) | ORAL | 0 refills | Status: DC | PRN

## 2016-05-31 MED ORDER — AMLODIPINE BESYLATE 10 MG PO TABS: 10.0000 mg | ORAL_TABLET | Freq: Every day | ORAL | 0 refills | Status: DC

## 2016-05-31 MED ORDER — VALSARTAN 320 MG PO TABS: 320.0000 mg | ORAL_TABLET | Freq: Every day | ORAL | 1 refills | Status: DC

## 2016-05-31 NOTE — Assessment & Plan Note (Signed)
Not to goal but did not take BP meds this AM

## 2016-05-31 NOTE — Assessment & Plan Note (Signed)
Benign essential according to neurology.  Check TSH

## 2016-05-31 NOTE — Assessment & Plan Note (Addendum)
Rx for Tramadol to take sparingly.  Await pain clinic evaluation

## 2016-05-31 NOTE — Assessment & Plan Note (Addendum)
Hgb A1C went down from 10.4 to 8.4.  Continue working on lifestyle.  May need Endocrine as not tolerating higher doses of insulin.  Will recheck in 3 months

## 2016-05-31 NOTE — Progress Notes (Signed)
BP (!) 142/83 (BP Location: Left Arm, Cuff Size: Normal)   Pulse 81   Temp 97.5 F (36.4 C)   Ht 5' 1.8" (1.57 m)   Wt 136 lb 9.6 oz (62 kg)   LMP  (LMP Unknown)   SpO2 96%   BMI 25.15 kg/m    Subjective:    Patient ID: Melanie King, female    DOB: 1945/10/31, 71 y.o.   MRN: [id number]  HPI: Melanie King is a 71 y.o. female  Chief Complaint  Patient presents with  . Diabetes  . Hypertension  . Hyperlipidemia  . Medication Refill    pt states she needs refills on tramadol, metoprolol, levothyroxine, furosemide, docusate sodium, amlodipine, and metformin [facility]    Diabetes: Using medications without difficulties.  She is taking 14 units QHS.  States when she was on higher doses she did not tolerate due to stool incontinence No hypoglycemic episodes No hyperglycemic episodes Feet problems:none Blood Sugars averaging: 120-170 eye exam within last year Last Hgb A1C: 10.7  Hypertension  Using medications without difficulty Average home BPs   Using medication without problems or lightheadedness No chest pain with exertion or shortness of breath No Edema  Elevated Cholesterol Using medications without problems No Muscle aches  Diet/Exercise: Plans to add exercise to diet changes  Pain Pt is reporting having severe pain due to arthritis flares.  States the pain is the left side of body from head to toe.  Ongoing for a number of years.  She is awaiting pain management but is 4 months our.  She would like a refill of Tramadol last given #30 on 03/24/2016.  Seeing psychiatry  Tremor Diagnosed with benign essential tremor with neurology   Relevant past medical, surgical, family and social history reviewed and updated as indicated. Interim medical history since our last visit reviewed. Allergies and medications reviewed and updated.  Review of Systems  Per HPI unless specifically indicated above     Objective:    BP (!) 142/83 (BP Location: Left  Arm, Cuff Size: Normal)   Pulse 81   Temp 97.5 F (36.4 C)   Ht 5' 1.8" (1.57 m)   Wt 136 lb 9.6 oz (62 kg)   LMP  (LMP Unknown)   SpO2 96%   BMI 25.15 kg/m   Wt Readings from Last 3 Encounters:  05/31/16 136 lb 9.6 oz (62 kg)  03/20/16 132 lb (59.9 kg)  03/13/16 131 lb (59.4 kg)    Physical Exam  Constitutional: She is oriented to person, place, and time. She appears well-developed and well-nourished. No distress.  HENT:  Head: Normocephalic and atraumatic.  Eyes: Conjunctivae and lids are normal. Right eye exhibits no discharge. Left eye exhibits no discharge. No scleral icterus.  Neck: Normal range of motion. Neck supple. No JVD present. Carotid bruit is not present.  Cardiovascular: Normal rate, regular rhythm and normal heart sounds.   Pulmonary/Chest: Effort normal and breath sounds normal.  Abdominal: Normal appearance. There is no splenomegaly or hepatomegaly.  Musculoskeletal: Normal range of motion.  Neurological: She is alert and oriented to person, place, and time.  Skin: Skin is warm, dry and intact. No rash noted. No pallor.  Psychiatric: She has a normal mood and affect. Her behavior is normal. Judgment and thought content normal.    Results for orders placed or performed in visit on 03/25/16  HM DIABETES EYE EXAM  Result Value Ref Range   HM Diabetic Eye Exam No  Retinopathy No Retinopathy      Assessment & Plan:   Problem List Items Addressed This Visit      Unprioritized   Chronic pain    Rx for Tramadol to take sparingly.  Await pain clinic evaluation      Relevant Medications   gabapentin (NEURONTIN) 300 MG capsule   primidone (MYSOLINE) 50 MG tablet   traMADol (ULTRAM) 50 MG tablet   Hyperlipidemia, unspecified   Relevant Medications   amLODipine (NORVASC) 10 MG tablet   valsartan (DIOVAN) 320 MG tablet   metoprolol tartrate (LOPRESSOR) 25 MG tablet   Hypertension    Not to goal but did not take BP meds this AM      Relevant Medications    amLODipine (NORVASC) 10 MG tablet   valsartan (DIOVAN) 320 MG tablet   metoprolol tartrate (LOPRESSOR) 25 MG tablet   Poorly controlled type 2 diabetes mellitus (HCC) - Primary    Hgb A1C went down from 10.4 to 8.4.  Continue working on lifestyle.  May need Endocrine as not tolerating higher doses of insulin.  Will recheck in 3 months      Relevant Medications   metFORMIN (GLUCOPHAGE-XR) 750 MG 24 hr tablet   valsartan (DIOVAN) 320 MG tablet   Other Relevant Orders   Comprehensive metabolic panel   Bayer DCA Hb Z6X Waived   Tremor    Benign essential according to neurology.  Check TSH       Other Visit Diagnoses    Tremors of nervous system       Relevant Orders   TSH       Follow up plan: Return in about 3 months (around 08/31/2016).

## 2016-06-01 ENCOUNTER — Other Ambulatory Visit: Payer: Self-pay | Admitting: Unknown Physician Specialty

## 2016-06-01 ENCOUNTER — Encounter: Payer: Self-pay | Admitting: Unknown Physician Specialty

## 2016-06-01 LAB — COMPREHENSIVE METABOLIC PANEL
ALBUMIN: 4.5 g/dL (ref 3.5–4.8)
ALK PHOS: 102 IU/L (ref 39–117)
ALT: 35 IU/L — ABNORMAL HIGH (ref 0–32)
AST: 24 IU/L (ref 0–40)
Albumin/Globulin Ratio: 1.7 (ref 1.2–2.2)
BUN/Creatinine Ratio: 12 (ref 12–28)
BUN: 11 mg/dL (ref 8–27)
Bilirubin Total: <0.2 mg/dL (ref 0.0–1.2)
CHLORIDE: 102 mmol/L (ref 96–106)
CO2: 25 mmol/L (ref 18–29)
Calcium: 9.8 mg/dL (ref 8.7–10.3)
Creatinine, Ser: 0.93 mg/dL (ref 0.57–1.00)
GFR, EST AFRICAN AMERICAN: 72 mL/min/{1.73_m2} (ref >59)
GFR, EST NON AFRICAN AMERICAN: 62 mL/min/{1.73_m2} (ref >59)
GLUCOSE: 148 mg/dL — AB (ref 65–99)
Globulin, Total: 2.6 g/dL (ref 1.5–4.5)
Potassium: 5.4 mmol/L — ABNORMAL HIGH (ref 3.5–5.2)
Sodium: 141 mmol/L (ref 134–144)
Total Protein: 7.1 g/dL (ref 6.0–8.5)

## 2016-06-01 LAB — TSH: TSH: 6.34 u[IU]/mL — ABNORMAL HIGH (ref 0.450–4.500)

## 2016-06-02 ENCOUNTER — Other Ambulatory Visit: Payer: Self-pay | Admitting: Unknown Physician Specialty

## 2016-07-02 ENCOUNTER — Other Ambulatory Visit: Payer: Self-pay | Admitting: Unknown Physician Specialty

## 2016-07-05 DIAGNOSIS — R251 Tremor, unspecified: Secondary | ICD-10-CM | POA: Diagnosis not present

## 2016-08-01 ENCOUNTER — Other Ambulatory Visit: Payer: Self-pay | Admitting: Unknown Physician Specialty

## 2016-08-09 ENCOUNTER — Telehealth: Payer: Self-pay | Admitting: Unknown Physician Specialty

## 2016-08-09 NOTE — Telephone Encounter (Signed)
Called pt to schedule Annual Wellness Visit with [facility]  - knb  °

## 2016-08-16 ENCOUNTER — Telehealth: Payer: Self-pay | Admitting: Unknown Physician Specialty

## 2016-08-16 NOTE — Telephone Encounter (Signed)
Called pt to schedule Annual Wellness Visit with [facility]  - knb  °

## 2016-08-26 ENCOUNTER — Other Ambulatory Visit: Payer: Self-pay | Admitting: Unknown Physician Specialty

## 2016-08-31 ENCOUNTER — Other Ambulatory Visit: Payer: Self-pay | Admitting: Unknown Physician Specialty

## 2016-08-31 ENCOUNTER — Other Ambulatory Visit: Payer: Self-pay | Admitting: Family Medicine

## 2016-08-31 NOTE — Telephone Encounter (Signed)
Routing to provider. Appt on 09/12/16. 

## 2016-09-01 NOTE — Telephone Encounter (Signed)
Routing to provider  

## 2016-09-04 DIAGNOSIS — R251 Tremor, unspecified: Secondary | ICD-10-CM | POA: Diagnosis not present

## 2016-09-07 ENCOUNTER — Telehealth: Payer: Self-pay | Admitting: Unknown Physician Specialty

## 2016-09-07 DIAGNOSIS — E118 Type 2 diabetes mellitus with unspecified complications: Secondary | ICD-10-CM | POA: Diagnosis not present

## 2016-09-07 DIAGNOSIS — E7800 Pure hypercholesterolemia, unspecified: Secondary | ICD-10-CM | POA: Diagnosis not present

## 2016-09-07 DIAGNOSIS — N898 Other specified noninflammatory disorders of vagina: Secondary | ICD-10-CM | POA: Diagnosis not present

## 2016-09-07 DIAGNOSIS — Z720 Tobacco use: Secondary | ICD-10-CM | POA: Diagnosis not present

## 2016-09-07 DIAGNOSIS — J449 Chronic obstructive pulmonary disease, unspecified: Secondary | ICD-10-CM | POA: Diagnosis not present

## 2016-09-07 DIAGNOSIS — F319 Bipolar disorder, unspecified: Secondary | ICD-10-CM | POA: Diagnosis not present

## 2016-09-07 DIAGNOSIS — F17200 Nicotine dependence, unspecified, uncomplicated: Secondary | ICD-10-CM | POA: Diagnosis not present

## 2016-09-07 DIAGNOSIS — I10 Essential (primary) hypertension: Secondary | ICD-10-CM | POA: Diagnosis not present

## 2016-09-07 DIAGNOSIS — I2510 Atherosclerotic heart disease of native coronary artery without angina pectoris: Secondary | ICD-10-CM | POA: Diagnosis not present

## 2016-09-07 NOTE — Telephone Encounter (Signed)
Called pt to schedule Annual Wellness Visit with [facility]  - knb  °

## 2016-09-12 ENCOUNTER — Ambulatory Visit: Payer: Medicare Other | Admitting: Unknown Physician Specialty

## 2016-09-13 ENCOUNTER — Telehealth: Payer: Self-pay | Admitting: Unknown Physician Specialty

## 2016-09-13 NOTE — Telephone Encounter (Signed)
Melanie King with [facility] called and stated that they received a order for incontinence supplies on March 15th 2018 but have yet to speak to the pt. This call was to inform the PCP that the pt had been called several times but never reached.

## 2016-09-14 NOTE — Telephone Encounter (Signed)
Called and left patient a VM asking for her to please return my call.  

## 2016-09-15 NOTE — Telephone Encounter (Signed)
Called and left patient a VM asking for her to please return my call.  

## 2016-09-18 DIAGNOSIS — R87610 Atypical squamous cells of undetermined significance on cytologic smear of cervix (ASC-US): Secondary | ICD-10-CM | POA: Diagnosis not present

## 2016-09-18 DIAGNOSIS — N898 Other specified noninflammatory disorders of vagina: Secondary | ICD-10-CM | POA: Diagnosis not present

## 2016-09-18 DIAGNOSIS — Z01411 Encounter for gynecological examination (general) (routine) with abnormal findings: Secondary | ICD-10-CM | POA: Diagnosis not present

## 2016-09-19 NOTE — Telephone Encounter (Signed)
Called and left patient a VM asking for her to please return my call. This was the 3rd attempt at reaching the patient, will send her a letter regarding this.

## 2016-09-20 DIAGNOSIS — F332 Major depressive disorder, recurrent severe without psychotic features: Secondary | ICD-10-CM | POA: Diagnosis not present

## 2016-09-26 DIAGNOSIS — F332 Major depressive disorder, recurrent severe without psychotic features: Secondary | ICD-10-CM | POA: Diagnosis not present

## 2016-10-24 ENCOUNTER — Telehealth: Payer: Self-pay | Admitting: Unknown Physician Specialty

## 2016-10-24 DIAGNOSIS — F411 Generalized anxiety disorder: Secondary | ICD-10-CM | POA: Diagnosis not present

## 2016-10-24 DIAGNOSIS — J449 Chronic obstructive pulmonary disease, unspecified: Secondary | ICD-10-CM | POA: Diagnosis not present

## 2016-10-24 DIAGNOSIS — I10 Essential (primary) hypertension: Secondary | ICD-10-CM | POA: Diagnosis not present

## 2016-10-24 DIAGNOSIS — Z950 Presence of cardiac pacemaker: Secondary | ICD-10-CM | POA: Diagnosis not present

## 2016-10-24 DIAGNOSIS — I2510 Atherosclerotic heart disease of native coronary artery without angina pectoris: Secondary | ICD-10-CM | POA: Diagnosis not present

## 2016-10-24 DIAGNOSIS — R0602 Shortness of breath: Secondary | ICD-10-CM | POA: Diagnosis not present

## 2016-10-24 DIAGNOSIS — R296 Repeated falls: Secondary | ICD-10-CM | POA: Diagnosis not present

## 2016-10-24 DIAGNOSIS — I209 Angina pectoris, unspecified: Secondary | ICD-10-CM | POA: Diagnosis not present

## 2016-10-24 DIAGNOSIS — I495 Sick sinus syndrome: Secondary | ICD-10-CM | POA: Diagnosis not present

## 2016-10-24 DIAGNOSIS — F17200 Nicotine dependence, unspecified, uncomplicated: Secondary | ICD-10-CM | POA: Diagnosis not present

## 2016-10-24 DIAGNOSIS — K219 Gastro-esophageal reflux disease without esophagitis: Secondary | ICD-10-CM | POA: Diagnosis not present

## 2016-10-24 DIAGNOSIS — E782 Mixed hyperlipidemia: Secondary | ICD-10-CM | POA: Diagnosis not present

## 2016-10-24 MED ORDER — OLMESARTAN MEDOXOMIL 40 MG PO TABS: 40.0000 mg | ORAL_TABLET | Freq: Every day | ORAL | 1 refills | Status: DC

## 2016-10-24 NOTE — Telephone Encounter (Signed)
Pharmacy cannot refill mendication for valsartan 320 mg due to it being recalled. Pharmacy cannot find an alternative for the medication.  Please Advise.  Thank you

## 2016-10-24 NOTE — Telephone Encounter (Signed)
Routing to provider  

## 2016-10-26 ENCOUNTER — Telehealth: Payer: Self-pay | Admitting: Unknown Physician Specialty

## 2016-10-26 MED ORDER — LOSARTAN POTASSIUM 100 MG PO TABS: 100.0000 mg | ORAL_TABLET | Freq: Every day | ORAL | 3 refills | Status: DC

## 2016-10-26 NOTE — Telephone Encounter (Signed)
Pharmacy calling on behalf of patient due to a recall on medication for valsartan. Pharmacy requesting approved alternative on the medication.  Please Advise.  Thank you.

## 2016-10-26 NOTE — Telephone Encounter (Signed)
[facility] spoke to the [facility] tech. I explained that we had sent over in a medication to replace the patient's valsartan 2 days ago, olmesartan. Tech stated that olmesartan was needing a PA but losartan was a preferred medication with patient's insurance. Can we send in Losartan instead for the patient?

## 2016-10-31 ENCOUNTER — Other Ambulatory Visit: Payer: Self-pay | Admitting: Family Medicine

## 2016-11-08 DIAGNOSIS — E1142 Type 2 diabetes mellitus with diabetic polyneuropathy: Secondary | ICD-10-CM | POA: Diagnosis not present

## 2016-11-08 DIAGNOSIS — B351 Tinea unguium: Secondary | ICD-10-CM | POA: Diagnosis not present

## 2016-11-10 ENCOUNTER — Encounter: Payer: Self-pay | Admitting: *Deleted

## 2016-11-29 ENCOUNTER — Telehealth: Payer: Self-pay | Admitting: *Deleted

## 2016-11-29 NOTE — Telephone Encounter (Signed)
Attempted to leave message for patient to notify them that it is time to schedule annual low dose lung cancer screening CT scan. However, this option is still not available.

## 2016-12-01 ENCOUNTER — Other Ambulatory Visit: Payer: Self-pay | Admitting: Unknown Physician Specialty

## 2016-12-04 ENCOUNTER — Encounter: Payer: Self-pay | Admitting: *Deleted

## 2016-12-05 DIAGNOSIS — M25551 Pain in right hip: Secondary | ICD-10-CM | POA: Diagnosis not present

## 2016-12-05 DIAGNOSIS — M1612 Unilateral primary osteoarthritis, left hip: Secondary | ICD-10-CM | POA: Diagnosis not present

## 2016-12-05 DIAGNOSIS — M25552 Pain in left hip: Secondary | ICD-10-CM | POA: Diagnosis not present

## 2016-12-05 DIAGNOSIS — R251 Tremor, unspecified: Secondary | ICD-10-CM | POA: Diagnosis not present

## 2016-12-06 DIAGNOSIS — M25552 Pain in left hip: Secondary | ICD-10-CM

## 2016-12-06 DIAGNOSIS — M25551 Pain in right hip: Secondary | ICD-10-CM | POA: Insufficient documentation

## 2016-12-21 DIAGNOSIS — I495 Sick sinus syndrome: Secondary | ICD-10-CM | POA: Diagnosis not present

## 2016-12-27 ENCOUNTER — Emergency Department: Payer: Medicare Other

## 2016-12-27 ENCOUNTER — Emergency Department
Admission: EM | Admit: 2016-12-27 | Discharge: 2016-12-27 | Disposition: A | Discharge status: Home / Self Care | Payer: Medicare Other | Attending: Emergency Medicine | Admitting: Emergency Medicine

## 2016-12-27 ENCOUNTER — Encounter: Payer: Self-pay | Admitting: Emergency Medicine

## 2016-12-27 DIAGNOSIS — E785 Hyperlipidemia, unspecified: Secondary | ICD-10-CM | POA: Diagnosis not present

## 2016-12-27 DIAGNOSIS — F17210 Nicotine dependence, cigarettes, uncomplicated: Secondary | ICD-10-CM | POA: Diagnosis not present

## 2016-12-27 DIAGNOSIS — Z7984 Long term (current) use of oral hypoglycemic drugs: Secondary | ICD-10-CM | POA: Diagnosis not present

## 2016-12-27 DIAGNOSIS — E119 Type 2 diabetes mellitus without complications: Secondary | ICD-10-CM | POA: Insufficient documentation

## 2016-12-27 DIAGNOSIS — Z79899 Other long term (current) drug therapy: Secondary | ICD-10-CM | POA: Diagnosis not present

## 2016-12-27 DIAGNOSIS — Z7982 Long term (current) use of aspirin: Secondary | ICD-10-CM | POA: Diagnosis not present

## 2016-12-27 DIAGNOSIS — I10 Essential (primary) hypertension: Secondary | ICD-10-CM | POA: Insufficient documentation

## 2016-12-27 DIAGNOSIS — L03113 Cellulitis of right upper limb: Secondary | ICD-10-CM | POA: Diagnosis not present

## 2016-12-27 DIAGNOSIS — Z950 Presence of cardiac pacemaker: Secondary | ICD-10-CM | POA: Insufficient documentation

## 2016-12-27 DIAGNOSIS — M79641 Pain in right hand: Secondary | ICD-10-CM

## 2016-12-27 DIAGNOSIS — S6991XA Unspecified injury of right wrist, hand and finger(s), initial encounter: Secondary | ICD-10-CM | POA: Diagnosis not present

## 2016-12-27 LAB — BASIC METABOLIC PANEL
Anion gap: 12 (ref 5–15)
BUN: 12 mg/dL (ref 6–20)
CHLORIDE: 100 mmol/L — AB (ref 101–111)
CO2: 25 mmol/L (ref 22–32)
Calcium: 9.3 mg/dL (ref 8.9–10.3)
Creatinine, Ser: 0.8 mg/dL (ref 0.44–1.00)
GFR calc Af Amer: >60 mL/min (ref >60)
GFR calc non Af Amer: >60 mL/min (ref >60)
Glucose, Bld: 176 mg/dL — ABNORMAL HIGH (ref 65–99)
POTASSIUM: 4 mmol/L (ref 3.5–5.1)
SODIUM: 137 mmol/L (ref 135–145)

## 2016-12-27 LAB — CBC
HEMATOCRIT: 39.7 % (ref 35.0–47.0)
HEMOGLOBIN: 13.7 g/dL (ref 12.0–16.0)
MCH: 31.1 pg (ref 26.0–34.0)
MCHC: 34.4 g/dL (ref 32.0–36.0)
MCV: 90.3 fL (ref 80.0–100.0)
Platelets: 323 10*3/uL (ref 150–440)
RBC: 4.4 MIL/uL (ref 3.80–5.20)
RDW: 13.9 % (ref 11.5–14.5)
WBC: 10.2 10*3/uL (ref 3.6–11.0)

## 2016-12-27 MED ORDER — CLINDAMYCIN PHOSPHATE 600 MG/50ML IV SOLN
600.0000 mg | Freq: Once | INTRAVENOUS | Status: AC
  Administered 2016-12-27: 600 mg via INTRAVENOUS
  Filled 2016-12-27: qty 50

## 2016-12-27 MED ORDER — SODIUM CHLORIDE 0.9 % IV BOLUS (SEPSIS)
1000.0000 mL | Freq: Once | INTRAVENOUS | Status: AC
  Administered 2016-12-27: 1000 mL via INTRAVENOUS

## 2016-12-27 MED ORDER — OXYCODONE-ACETAMINOPHEN 5-325 MG PO TABS
1.0000 | ORAL_TABLET | Freq: Once | ORAL | Status: AC
  Administered 2016-12-27: 1 via ORAL
  Filled 2016-12-27: qty 1

## 2016-12-27 MED ORDER — ETODOLAC 200 MG PO CAPS: 200.0000 mg | ORAL_CAPSULE | Freq: Three times a day (TID) | ORAL | 0 refills | Status: DC

## 2016-12-27 MED ORDER — CLINDAMYCIN HCL 300 MG PO CAPS: 300.0000 mg | ORAL_CAPSULE | Freq: Three times a day (TID) | ORAL | 0 refills | Status: AC

## 2016-12-27 NOTE — ED Triage Notes (Signed)
Patient to ER for c/o hand injury to right hand approx one week ago. States she fell on hand and "got carpet burn". Patient has area to right hand that is approx 3 inches x1 inch that appears to originally have been a skin tear. Patient states area to skin and joint are still causing her pain, and that she is unable to pick things up with hand.

## 2016-12-27 NOTE — ED Provider Notes (Signed)
[facility] Emergency Department Provider Note   ____________________________________________   First MD Initiated Contact with Patient 12/27/16 [phone]     (approximate)  I have reviewed the triage vital signs and the nursing notes.   HISTORY  Chief Complaint Hand Injury    HPI Melanie King is a 71 y.o. female Who comes into the hospital today with some right hand pain. The patient states that she fell approximately one week ago and obtained a skin tear. She has been placing Neosporin on the area as well as peroxide on it to keep it clean. She reports that it became more painful and red. The patient is diabetic and states that her sugars have been running high as well. She's been taking tramadol for pain but it has not been helping. The patient denies any fevers but has felt nauseous. She reports her pain as a 7 out of 10 in intensity currently. The patient came into the hospital today for evaluation of her symptoms.   Past Medical History:  Diagnosis Date  . Arthritis   . DDD (degenerative disc disease), cervical   . DDD (degenerative disc disease), lumbar   . Depression   . Diabetes mellitus without complication (HCC)   . GERD (gastroesophageal reflux disease)   . Hyperlipidemia   . Hypertension   . Osteoarthritis   . Pacemaker     Patient Active Problem List   Diagnosis Date Noted  . Tremor 05/27/2016  . Trigger finger of left hand 02/16/2016  . Osteoarthritis of both hands 02/16/2016  . Angina pectoris (HCC) 01/17/2016  . COPD (chronic obstructive pulmonary disease) (HCC) 01/17/2016  . Heart disease 01/17/2016  . Hyperlipidemia, unspecified 01/17/2016  . Hypertension 01/17/2016  . Sick sinus syndrome (HCC) 01/17/2016  . Poorly controlled type 2 diabetes mellitus (HCC) 01/17/2016  . Chronic pain 01/17/2016  . Personal history of tobacco use, presenting hazards to health 11/16/2015  . Type II or unspecified type diabetes mellitus with  neurological manifestations, not stated as uncontrolled(250.60) 05/12/2013    Past Surgical History:  Procedure Laterality Date  . ABDOMINAL HYSTERECTOMY    . BREAST BIOPSY Right 1994   neg cyst removed  . CHOLECYSTECTOMY    . EYE SURGERY  1964, 1966, and 1967   bilateral  . FOOT SURGERY Right    cellulitis  . PACEMAKER INSERTION      Prior to Admission medications   Medication Sig Start Date End Date Taking? Authorizing Provider  amLODipine (NORVASC) 10 MG tablet TAKE 1 TABLET BY MOUTH EVERY DAY 08/28/16   Gabriel Cirri, NP  aspirin EC 81 MG tablet Take 1 tablet (81 mg total) by mouth daily. 03/13/16   Johnson, Megan P, DO  citalopram (CELEXA) 10 MG tablet Take 10 mg by mouth 3 (three) times daily. 01/05/16   [provider]  clindamycin (CLEOCIN) 300 MG capsule Take 1 capsule (300 mg total) by mouth 3 (three) times daily. 12/27/16 01/06/17  Rebecka Apley, MD  Cyanocobalamin (VITAMIN B-12) 2500 MCG SUBL Place 2,500 mcg under the tongue every other day.    [provider]  diclofenac sodium (VOLTAREN) 1 % GEL Apply topically 4 (four) times daily.  03/14/16   [provider]  docusate sodium (COLACE) 100 MG capsule TAKE 1 CAPSULE BY MOUTH EVERY DAY 09/01/16   Particia Nearing, PA-C  ergocalciferol (VITAMIN D2) 50000 units capsule Take 50,000 Units by mouth every 30 (thirty) days.    [provider]  etodolac (LODINE)  200 MG capsule Take 1 capsule (200 mg total) by mouth every 8 (eight) hours. 12/27/16   Rebecka Apley, MD  fluticasone (FLONASE) 50 MCG/ACT nasal spray Place 1 spray into both nostrils daily. 04/14/16   Gabriel Cirri, NP  furosemide (LASIX) 20 MG tablet TAKE 1 TABLET BY MOUTH EVERY DAY FOR FLUID 10/31/16   Gabriel Cirri, NP  gabapentin (NEURONTIN) 300 MG capsule Take 300 mg by mouth 2 (two) times daily. 05/24/16 06/23/16  [provider]  LANTUS SOLOSTAR 100 UNIT/ML Solostar Pen Inject 20 Units into the skin daily. Patient  taking differently: Inject 14 Units into the skin daily.  03/20/16   Gabriel Cirri, NP  levothyroxine (SYNTHROID, LEVOTHROID) 125 MCG tablet TAKE 1 TABLET BY MOUTH EVERY DAY 11/01/16   Gabriel Cirri, NP  losartan (COZAAR) 100 MG tablet Take 1 tablet (100 mg total) by mouth daily. 10/26/16   Gabriel Cirri, NP  metFORMIN (GLUCOPHAGE-XR) 750 MG 24 hr tablet TAKE 2 TABLETS (1,500 MG TOTAL) BY MOUTH DAILY WITH BREAKFAST. 12/01/16   Gabriel Cirri, NP  metoprolol tartrate (LOPRESSOR) 25 MG tablet Take 3 tablets (75 mg total) by mouth 2 (two) times daily. 05/31/16   Gabriel Cirri, NP  metoprolol tartrate (LOPRESSOR) 25 MG tablet TAKE 3 TABLETS (75 MG TOTAL) BY MOUTH 2 (TWO) TIMES DAILY. 08/01/16   Johnson, Megan P, DO  montelukast (SINGULAIR) 10 MG tablet Take 1 tablet (10 mg total) by mouth daily. 03/20/16   Gabriel Cirri, NP  Multiple Vitamins-Minerals (CERTAVITE SENIOR/ANTIOXIDANT) TABS TAKE 1 TABLET BY MOUTH EVERY OTHER DAY 01/21/16   Gabriel Cirri, NP  nitroGLYCERIN (NITRODUR - DOSED IN MG/24 HR) 0.2 mg/hr patch APPLY 1 PATCH ONTO THE SKIN ONCE DAILY 09/01/16   Particia Nearing, PA-C  nitroGLYCERIN (NITROSTAT) 0.4 MG SL tablet Place 0.4 mg under the tongue every 5 (five) minutes as needed for chest pain.    [provider]  primidone (MYSOLINE) 50 MG tablet Take 100 mg in the am, and 50 mg at night 05/24/16   [provider]  ranitidine (ZANTAC) 150 MG tablet TAKE 1 TABLET (150 MG TOTAL) BY MOUTH 2 (TWO) TIMES DAILY. 06/02/16   Gabriel Cirri, NP  REXULTI 1 MG TABS Take 1 mg by mouth daily. 01/05/16   [provider]  rosuvastatin (CRESTOR) 20 MG tablet Take 1 tablet (20 mg total) by mouth at bedtime. 03/20/16   Gabriel Cirri, NP  traMADol (ULTRAM) 50 MG tablet Take 1 tablet (50 mg total) by mouth every 8 (eight) hours as needed. 05/31/16   Gabriel Cirri, NP  Stann Ore SHORT PEN NEEDLES 31G X 8 MM MISC  01/31/16   [provider]    Allergies Alprazolam; Demerol  [meperidine]; and Fentanyl  Family History  Problem Relation Age of Onset  . Breast cancer Maternal Aunt 40  . Cancer Mother        colon  . Aneurysm Father     Social History Social History  Substance Use Topics  . Smoking status: Current Every Day Smoker    Packs/day: 0.75    Years: 50.00    Types: Cigarettes  . Smokeless tobacco: Never Used  . Alcohol use No    Review of Systems  Constitutional: No fever/chills Eyes: No visual changes. ENT: No sore throat. Cardiovascular: Denies chest pain. Respiratory: Denies shortness of breath. Gastrointestinal: No abdominal pain.  No nausea, no vomiting.  No diarrhea.  No constipation. Genitourinary: Negative for dysuria. Musculoskeletal: right hand pain Skin: redness to right hand  at base of thumb Neurological: Negative for headaches, focal weakness or numbness.   ____________________________________________   PHYSICAL EXAM:  VITAL SIGNS: ED Triage Vitals  Enc Vitals Group     BP 12/27/16 0110 (!) 144/64     Pulse Rate 12/27/16 0110 78     Resp 12/27/16 0110 20     Temp 12/27/16 0110 98.3 F (36.8 C)     Temp Source 12/27/16 0110 Oral     SpO2 12/27/16 0110 100 %     Weight 12/27/16 0111 135 lb (61.2 kg)     Height 12/27/16 0111 5\' 1"  (1.549 m)     Head Circumference --      Peak Flow --      Pain Score 12/27/16 0109 7     Pain Loc --      Pain Edu? --      Excl. in GC? --     Constitutional: Alert and oriented. Well appearing and in moderate distress. Eyes: Conjunctivae are normal. PERRL. EOMI. Head: Atraumatic. Nose: No congestion/rhinnorhea. Mouth/Throat: Mucous membranes are moist.  Oropharynx non-erythematous. Cardiovascular: Normal rate, regular rhythm. Grossly normal heart sounds.  Good peripheral circulation. Respiratory: Normal respiratory effort.  No retractions. Lungs CTAB. Gastrointestinal: Soft and nontender. No distention. Positive bowel sounds Musculoskeletal: pain to palpation to right hand  at base of thumb, healing skin tear noted with some surrounding erythema which is warm to the touch Neurologic:  Normal speech and language.  Skin:  Skin is warm, dry and intact. healing skin tear noted with some surrounding erythema to right hand which is warm to the touch Psychiatric: Mood and affect are normal.   ____________________________________________   LABS (all labs ordered are listed, but only abnormal results are displayed)  Labs Reviewed  BASIC METABOLIC PANEL - Abnormal; Notable for the following:       Result Value   Chloride 100 (*)    Glucose, Bld 176 (*)    All other components within normal limits  CULTURE, BLOOD (ROUTINE X 2)  CULTURE, BLOOD (ROUTINE X 2)  CBC   ____________________________________________  EKG  none ____________________________________________  RADIOLOGY  Dg Hand Complete Right  Result Date: 12/27/2016 CLINICAL DATA:  Larey Seat 1 week ago, carpet burn. EXAM: RIGHT HAND - COMPLETE 3+ VIEW COMPARISON:  None. FINDINGS: There is no evidence of fracture or dislocation. There is no evidence of advanced arthropathy or other focal bone abnormality. Soft tissues are unremarkable. IMPRESSION: No acute osseous process. Electronically Signed   By: Awilda Metro M.D.   On: 12/27/2016 01:41    ____________________________________________   PROCEDURES  Procedure(s) performed: None  Procedures  Critical Care performed: No  ____________________________________________   INITIAL IMPRESSION / ASSESSMENT AND PLAN / ED COURSE  As part of my medical decision making, I reviewed the following data within the electronic MEDICAL RECORD NUMBER Notes from prior ED visits and  Controlled Substance Database   This is a 71 year old female who comes into the hospital today with some right hand pain near a skin tear she obtained approximately one week ago.   My differential diagnosis includes musculoskeletal pain versus cellulitis versus fracture.  We did  send the patient for an x-ray which is unremarkable. I will give the patient a dose of clindamycin as well as some Percocet. We will check a CBC and a BMP given the patient's history of diabetes. She will be reassessed when she's received her medications.    The patient's blood work is unremarkable. She received her  medication and will be discharged to home to follow up with her primary care physician.   ____________________________________________   FINAL CLINICAL IMPRESSION(S) / ED DIAGNOSES  Final diagnoses:  Cellulitis of right upper extremity  Pain of right hand      NEW MEDICATIONS STARTED DURING THIS VISIT:  New Prescriptions   CLINDAMYCIN (CLEOCIN) 300 MG CAPSULE    Take 1 capsule (300 mg total) by mouth 3 (three) times daily.   ETODOLAC (LODINE) 200 MG CAPSULE    Take 1 capsule (200 mg total) by mouth every 8 (eight) hours.     Note:  This document was prepared using Dragon voice recognition software and may include unintentional dictation errors.    Rebecka ApleyWebster, Baleigh Rennaker P, MD 12/27/16 [phone]

## 2016-12-27 NOTE — Discharge Instructions (Signed)
Please follow up with your primary care physician for repeat evaluation of your cellulitis

## 2017-01-01 LAB — CULTURE, BLOOD (ROUTINE X 2)
CULTURE: NO GROWTH
Culture: NO GROWTH
SPECIAL REQUESTS: ADEQUATE
Special Requests: ADEQUATE

## 2017-01-02 ENCOUNTER — Other Ambulatory Visit: Payer: Self-pay | Admitting: Family Medicine

## 2017-01-11 DIAGNOSIS — E1165 Type 2 diabetes mellitus with hyperglycemia: Secondary | ICD-10-CM | POA: Diagnosis not present

## 2017-01-11 DIAGNOSIS — E118 Type 2 diabetes mellitus with unspecified complications: Secondary | ICD-10-CM | POA: Diagnosis not present

## 2017-01-11 DIAGNOSIS — M25551 Pain in right hip: Secondary | ICD-10-CM | POA: Diagnosis not present

## 2017-01-11 DIAGNOSIS — M160 Bilateral primary osteoarthritis of hip: Secondary | ICD-10-CM | POA: Diagnosis not present

## 2017-01-11 DIAGNOSIS — R918 Other nonspecific abnormal finding of lung field: Secondary | ICD-10-CM | POA: Diagnosis not present

## 2017-01-11 DIAGNOSIS — E034 Atrophy of thyroid (acquired): Secondary | ICD-10-CM | POA: Diagnosis not present

## 2017-01-11 DIAGNOSIS — E7800 Pure hypercholesterolemia, unspecified: Secondary | ICD-10-CM | POA: Diagnosis not present

## 2017-01-11 DIAGNOSIS — R05 Cough: Secondary | ICD-10-CM | POA: Diagnosis not present

## 2017-01-11 DIAGNOSIS — F319 Bipolar disorder, unspecified: Secondary | ICD-10-CM | POA: Diagnosis not present

## 2017-01-11 DIAGNOSIS — I10 Essential (primary) hypertension: Secondary | ICD-10-CM | POA: Diagnosis not present

## 2017-01-11 DIAGNOSIS — J449 Chronic obstructive pulmonary disease, unspecified: Secondary | ICD-10-CM | POA: Diagnosis not present

## 2017-01-11 DIAGNOSIS — F17200 Nicotine dependence, unspecified, uncomplicated: Secondary | ICD-10-CM | POA: Diagnosis not present

## 2017-01-11 DIAGNOSIS — D519 Vitamin B12 deficiency anemia, unspecified: Secondary | ICD-10-CM | POA: Diagnosis not present

## 2017-01-11 DIAGNOSIS — M8580 Other specified disorders of bone density and structure, unspecified site: Secondary | ICD-10-CM | POA: Diagnosis not present

## 2017-01-23 DIAGNOSIS — F331 Major depressive disorder, recurrent, moderate: Secondary | ICD-10-CM | POA: Diagnosis not present

## 2017-01-29 ENCOUNTER — Other Ambulatory Visit: Payer: Self-pay | Admitting: Family Medicine

## 2017-02-07 DIAGNOSIS — F331 Major depressive disorder, recurrent, moderate: Secondary | ICD-10-CM | POA: Diagnosis not present

## 2017-02-23 DIAGNOSIS — F331 Major depressive disorder, recurrent, moderate: Secondary | ICD-10-CM | POA: Diagnosis not present

## 2017-03-15 ENCOUNTER — Other Ambulatory Visit: Payer: Self-pay | Admitting: Family Medicine

## 2017-03-15 NOTE — Telephone Encounter (Signed)
Your patient 

## 2017-03-16 DIAGNOSIS — F331 Major depressive disorder, recurrent, moderate: Secondary | ICD-10-CM | POA: Diagnosis not present

## 2017-03-28 ENCOUNTER — Other Ambulatory Visit: Payer: Self-pay

## 2017-03-28 ENCOUNTER — Observation Stay
Admission: EM | Admit: 2017-03-28 | Discharge: 2017-03-30 | Disposition: A | Discharge status: Home / Self Care | Payer: Medicare Other | Attending: Internal Medicine | Admitting: Internal Medicine

## 2017-03-28 ENCOUNTER — Encounter: Payer: Self-pay | Admitting: Emergency Medicine

## 2017-03-28 ENCOUNTER — Emergency Department: Payer: Medicare Other

## 2017-03-28 DIAGNOSIS — Z79899 Other long term (current) drug therapy: Secondary | ICD-10-CM | POA: Diagnosis not present

## 2017-03-28 DIAGNOSIS — F329 Major depressive disorder, single episode, unspecified: Secondary | ICD-10-CM | POA: Insufficient documentation

## 2017-03-28 DIAGNOSIS — Z950 Presence of cardiac pacemaker: Secondary | ICD-10-CM | POA: Diagnosis not present

## 2017-03-28 DIAGNOSIS — Z794 Long term (current) use of insulin: Secondary | ICD-10-CM | POA: Diagnosis not present

## 2017-03-28 DIAGNOSIS — I495 Sick sinus syndrome: Secondary | ICD-10-CM | POA: Diagnosis present

## 2017-03-28 DIAGNOSIS — I509 Heart failure, unspecified: Secondary | ICD-10-CM | POA: Diagnosis not present

## 2017-03-28 DIAGNOSIS — I110 Hypertensive heart disease with heart failure: Secondary | ICD-10-CM | POA: Insufficient documentation

## 2017-03-28 DIAGNOSIS — R079 Chest pain, unspecified: Secondary | ICD-10-CM

## 2017-03-28 DIAGNOSIS — I2510 Atherosclerotic heart disease of native coronary artery without angina pectoris: Secondary | ICD-10-CM | POA: Diagnosis present

## 2017-03-28 DIAGNOSIS — K219 Gastro-esophageal reflux disease without esophagitis: Secondary | ICD-10-CM | POA: Diagnosis not present

## 2017-03-28 DIAGNOSIS — R0602 Shortness of breath: Secondary | ICD-10-CM | POA: Diagnosis not present

## 2017-03-28 DIAGNOSIS — Z23 Encounter for immunization: Secondary | ICD-10-CM | POA: Insufficient documentation

## 2017-03-28 DIAGNOSIS — F17210 Nicotine dependence, cigarettes, uncomplicated: Secondary | ICD-10-CM | POA: Diagnosis not present

## 2017-03-28 DIAGNOSIS — I209 Angina pectoris, unspecified: Secondary | ICD-10-CM | POA: Diagnosis not present

## 2017-03-28 DIAGNOSIS — Z7982 Long term (current) use of aspirin: Secondary | ICD-10-CM | POA: Diagnosis not present

## 2017-03-28 DIAGNOSIS — I4510 Unspecified right bundle-branch block: Secondary | ICD-10-CM | POA: Diagnosis not present

## 2017-03-28 DIAGNOSIS — E119 Type 2 diabetes mellitus without complications: Secondary | ICD-10-CM

## 2017-03-28 DIAGNOSIS — J441 Chronic obstructive pulmonary disease with (acute) exacerbation: Secondary | ICD-10-CM | POA: Diagnosis present

## 2017-03-28 DIAGNOSIS — Z885 Allergy status to narcotic agent status: Secondary | ICD-10-CM | POA: Insufficient documentation

## 2017-03-28 DIAGNOSIS — J449 Chronic obstructive pulmonary disease, unspecified: Secondary | ICD-10-CM | POA: Diagnosis present

## 2017-03-28 DIAGNOSIS — E785 Hyperlipidemia, unspecified: Secondary | ICD-10-CM | POA: Diagnosis not present

## 2017-03-28 DIAGNOSIS — M1990 Unspecified osteoarthritis, unspecified site: Secondary | ICD-10-CM | POA: Diagnosis not present

## 2017-03-28 DIAGNOSIS — Z888 Allergy status to other drugs, medicaments and biological substances status: Secondary | ICD-10-CM | POA: Insufficient documentation

## 2017-03-28 DIAGNOSIS — I10 Essential (primary) hypertension: Secondary | ICD-10-CM | POA: Diagnosis not present

## 2017-03-28 DIAGNOSIS — I25110 Atherosclerotic heart disease of native coronary artery with unstable angina pectoris: Secondary | ICD-10-CM | POA: Diagnosis not present

## 2017-03-28 HISTORY — DX: Heart failure, unspecified: I50.9

## 2017-03-28 LAB — BASIC METABOLIC PANEL
Anion gap: 8 (ref 5–15)
BUN: 17 mg/dL (ref 6–20)
CHLORIDE: 108 mmol/L (ref 101–111)
CO2: 23 mmol/L (ref 22–32)
CREATININE: 0.92 mg/dL (ref 0.44–1.00)
Calcium: 8.7 mg/dL — ABNORMAL LOW (ref 8.9–10.3)
GFR calc non Af Amer: >60 mL/min (ref >60)
Glucose, Bld: 147 mg/dL — ABNORMAL HIGH (ref 65–99)
POTASSIUM: 4.4 mmol/L (ref 3.5–5.1)
SODIUM: 139 mmol/L (ref 135–145)

## 2017-03-28 LAB — CBC
HCT: 40.2 % (ref 35.0–47.0)
Hemoglobin: 13.4 g/dL (ref 12.0–16.0)
MCH: 30.7 pg (ref 26.0–34.0)
MCHC: 33.4 g/dL (ref 32.0–36.0)
MCV: 92 fL (ref 80.0–100.0)
PLATELETS: 257 10*3/uL (ref 150–440)
RBC: 4.37 MIL/uL (ref 3.80–5.20)
RDW: 15.1 % — ABNORMAL HIGH (ref 11.5–14.5)
WBC: 12.5 10*3/uL — ABNORMAL HIGH (ref 3.6–11.0)

## 2017-03-28 LAB — TROPONIN I
Troponin I: <0.03 ng/mL (ref <0.03)
Troponin I: <0.03 ng/mL (ref <0.03)

## 2017-03-28 MED ORDER — ROSUVASTATIN CALCIUM 10 MG PO TABS
20.0000 mg | ORAL_TABLET | Freq: Every day | ORAL | Status: DC
  Administered 2017-03-28 – 2017-03-29 (×2): 20 mg via ORAL
  Filled 2017-03-28 (×2): qty 2

## 2017-03-28 MED ORDER — ONDANSETRON HCL 4 MG/2ML IJ SOLN
4.0000 mg | Freq: Four times a day (QID) | INTRAMUSCULAR | Status: DC | PRN
  Administered 2017-03-30: 4 mg via INTRAVENOUS
  Filled 2017-03-28: qty 2

## 2017-03-28 MED ORDER — IOPAMIDOL (ISOVUE-370) INJECTION 76%
75.0000 mL | Freq: Once | INTRAVENOUS | Status: AC | PRN
  Administered 2017-03-28: 75 mL via INTRAVENOUS

## 2017-03-28 MED ORDER — NITROGLYCERIN 0.4 MG SL SUBL
0.4000 mg | SUBLINGUAL_TABLET | SUBLINGUAL | Status: DC | PRN
  Administered 2017-03-28: 0.4 mg via SUBLINGUAL
  Filled 2017-03-28: qty 1

## 2017-03-28 MED ORDER — ASPIRIN 81 MG PO CHEW
324.0000 mg | CHEWABLE_TABLET | Freq: Once | ORAL | Status: AC
  Administered 2017-03-28: 324 mg via ORAL
  Filled 2017-03-28: qty 4

## 2017-03-28 MED ORDER — LEVOTHYROXINE SODIUM 25 MCG PO TABS
125.0000 ug | ORAL_TABLET | Freq: Every day | ORAL | Status: DC
  Administered 2017-03-29 – 2017-03-30 (×2): 125 ug via ORAL
  Filled 2017-03-28 (×2): qty 1

## 2017-03-28 MED ORDER — ACETAMINOPHEN 325 MG PO TABS
650.0000 mg | ORAL_TABLET | Freq: Four times a day (QID) | ORAL | Status: DC | PRN
  Administered 2017-03-29: 650 mg via ORAL
  Filled 2017-03-28: qty 2

## 2017-03-28 MED ORDER — FAMOTIDINE 20 MG PO TABS
20.0000 mg | ORAL_TABLET | Freq: Two times a day (BID) | ORAL | Status: DC
  Administered 2017-03-28 – 2017-03-30 (×4): 20 mg via ORAL
  Filled 2017-03-28 (×4): qty 1

## 2017-03-28 MED ORDER — BREXPIPRAZOLE 1 MG PO TABS
1.0000 mg | ORAL_TABLET | Freq: Every day | ORAL | Status: DC
  Administered 2017-03-29 – 2017-03-30 (×2): 1 mg via ORAL
  Filled 2017-03-28 (×2): qty 1

## 2017-03-28 MED ORDER — CITALOPRAM HYDROBROMIDE 20 MG PO TABS
20.0000 mg | ORAL_TABLET | Freq: Every day | ORAL | Status: DC
  Administered 2017-03-28 – 2017-03-29 (×2): 20 mg via ORAL
  Filled 2017-03-28: qty 1

## 2017-03-28 MED ORDER — ENOXAPARIN SODIUM 40 MG/0.4ML ~~LOC~~ SOLN
40.0000 mg | SUBCUTANEOUS | Status: DC
  Administered 2017-03-29: 40 mg via SUBCUTANEOUS
  Filled 2017-03-28: qty 0.4

## 2017-03-28 MED ORDER — ASPIRIN EC 81 MG PO TBEC
81.0000 mg | DELAYED_RELEASE_TABLET | Freq: Every day | ORAL | Status: DC
  Administered 2017-03-29 – 2017-03-30 (×2): 81 mg via ORAL
  Filled 2017-03-28 (×2): qty 1

## 2017-03-28 MED ORDER — PNEUMOCOCCAL VAC POLYVALENT 25 MCG/0.5ML IJ INJ
0.5000 mL | INJECTION | INTRAMUSCULAR | Status: AC
  Administered 2017-03-30: 0.5 mL via INTRAMUSCULAR
  Filled 2017-03-28: qty 0.5

## 2017-03-28 MED ORDER — ACETAMINOPHEN 650 MG RE SUPP: 650.0000 mg | Freq: Four times a day (QID) | RECTAL | Status: DC | PRN

## 2017-03-28 MED ORDER — INFLUENZA VAC SPLIT HIGH-DOSE 0.5 ML IM SUSY
0.5000 mL | PREFILLED_SYRINGE | INTRAMUSCULAR | Status: AC
  Administered 2017-03-30: 0.5 mL via INTRAMUSCULAR
  Filled 2017-03-28 (×2): qty 0.5

## 2017-03-28 MED ORDER — INSULIN ASPART 100 UNIT/ML ~~LOC~~ SOLN
0.0000 [IU] | Freq: Four times a day (QID) | SUBCUTANEOUS | Status: DC
  Administered 2017-03-29: 5 [IU] via SUBCUTANEOUS
  Administered 2017-03-29: 2 [IU] via SUBCUTANEOUS
  Administered 2017-03-29: 5 [IU] via SUBCUTANEOUS
  Administered 2017-03-30: 2 [IU] via SUBCUTANEOUS
  Administered 2017-03-30: 1 [IU] via SUBCUTANEOUS
  Administered 2017-03-30: 2 [IU] via SUBCUTANEOUS
  Filled 2017-03-28 (×6): qty 1

## 2017-03-28 MED ORDER — ONDANSETRON HCL 4 MG PO TABS: 4.0000 mg | ORAL_TABLET | Freq: Four times a day (QID) | ORAL | Status: DC | PRN

## 2017-03-28 MED ORDER — OXYCODONE HCL 5 MG PO TABS
5.0000 mg | ORAL_TABLET | ORAL | Status: DC | PRN
Start: 2017-03-28 — End: 2017-03-30
  Administered 2017-03-28 – 2017-03-29 (×2): 5 mg via ORAL
  Filled 2017-03-28 (×2): qty 1

## 2017-03-28 NOTE — ED Notes (Signed)
Pt reports continued chest pain and reports that previous nitro gave no relief. Pt is in no distress at this time.

## 2017-03-28 NOTE — ED Provider Notes (Signed)
[facility] Emergency Department Provider Note  ____________________________________________  Time seen: Approximately 4:02 PM  I have reviewed the triage vital signs and the nursing notes.   HISTORY  Chief Complaint Chest Pain    HPI Melanie King is a 72 y.o. female who complains of central chest pain described as heaviness and pressure that started yesterday while sitting on her couch watching TV in the evening. It radiates to both shoulders and arms and is associated with shortness of breath. No vomiting or diaphoresis. No dizziness or syncope. Worse with exertion, better with rest. Took nitroglycerin at home which seemed to relieve it, but today it is recurrent and worse and when she took nitroglycerin and it would not go away. Moderate severity.  She reports a history of heart disease and multiple stents in the past. Her cardiologist is Dr. Juliann Pares.   Past Medical History:  Diagnosis Date  . Arthritis   . CHF (congestive heart failure) (HCC)   . DDD (degenerative disc disease), cervical   . DDD (degenerative disc disease), lumbar   . Depression   . Diabetes mellitus without complication (HCC)   . GERD (gastroesophageal reflux disease)   . Hyperlipidemia   . Hypertension   . Osteoarthritis   . Pacemaker      Patient Active Problem List   Diagnosis Date Noted  . Tremor 05/27/2016  . Trigger finger of left hand 02/16/2016  . Osteoarthritis of both hands 02/16/2016  . Angina pectoris (HCC) 01/17/2016  . COPD (chronic obstructive pulmonary disease) (HCC) 01/17/2016  . CAD (coronary artery disease) 01/17/2016  . HLD (hyperlipidemia) 01/17/2016  . Hypertension 01/17/2016  . Sick sinus syndrome (HCC) 01/17/2016  . Chronic pain 01/17/2016  . Personal history of tobacco use, presenting hazards to health 11/16/2015  . Diabetes (HCC) 05/12/2013     Past Surgical History:  Procedure Laterality Date  . ABDOMINAL HYSTERECTOMY    . BREAST  BIOPSY Right 1994   neg cyst removed  . CHOLECYSTECTOMY    . EYE SURGERY  1964, 1966, and 1967   bilateral  . FOOT SURGERY Right    cellulitis  . PACEMAKER INSERTION       Prior to Admission medications   Medication Sig Start Date End Date Taking? Authorizing Provider  albuterol (PROVENTIL HFA;VENTOLIN HFA) 108 (90 Base) MCG/ACT inhaler Inhale 2 puffs into the lungs every 6 (six) hours as needed for wheezing or shortness of breath.   Yes [provider]  amLODipine (NORVASC) 10 MG tablet TAKE 1 TABLET BY MOUTH EVERY DAY 08/28/16  Yes Gabriel Cirri, NP  aspirin 81 MG EC tablet TAKE 1 TABLET (81 MG TOTAL) BY MOUTH DAILY. 03/15/17  Yes Gabriel Cirri, NP  benztropine (COGENTIN) 1 MG tablet Take 1 mg by mouth 2 (two) times daily.   Yes [provider]  citalopram (CELEXA) 20 MG tablet Take 20 mg by mouth daily.  01/05/16  Yes [provider]  Cyanocobalamin (VITAMIN B-12) 2500 MCG SUBL Place 2,500 mcg under the tongue every other day.   Yes [provider]  docusate sodium (COLACE) 100 MG capsule TAKE 1 CAPSULE BY MOUTH EVERY DAY 01/02/17  Yes Gabriel Cirri, NP  fluticasone (FLONASE) 50 MCG/ACT nasal spray Place 1 spray into both nostrils daily. 04/14/16  Yes Gabriel Cirri, NP  furosemide (LASIX) 20 MG tablet TAKE 1 TABLET BY MOUTH EVERY DAY FOR FLUID 10/31/16  Yes Gabriel Cirri, NP  gabapentin (NEURONTIN) 300 MG capsule Take 300 mg by mouth 3 (  three) times daily.  05/24/16 03/28/17 Yes [provider]  LANTUS SOLOSTAR 100 UNIT/ML Solostar Pen Inject 20 Units into the skin daily. Patient taking differently: Inject 22 Units into the skin daily.  03/20/16  Yes Gabriel Cirri, NP  levothyroxine (SYNTHROID, LEVOTHROID) 125 MCG tablet TAKE 1 TABLET BY MOUTH EVERY DAY 11/01/16  Yes Gabriel Cirri, NP  losartan (COZAAR) 100 MG tablet Take 1 tablet (100 mg total) by mouth daily. 10/26/16  Yes Gabriel Cirri, NP  Multiple Vitamins-Minerals (CERTAVITE  SENIOR/ANTIOXIDANT) TABS TAKE 1 TABLET BY MOUTH EVERY OTHER DAY 01/21/16  Yes Gabriel Cirri, NP  nitroGLYCERIN (NITROSTAT) 0.4 MG SL tablet Place 0.4 mg under the tongue every 5 (five) minutes as needed for chest pain.   Yes [provider]  ranitidine (ZANTAC) 150 MG tablet TAKE 1 TABLET (150 MG TOTAL) BY MOUTH 2 (TWO) TIMES DAILY. 06/02/16  Yes Gabriel Cirri, NP  REXULTI 1 MG TABS Take 1 mg by mouth daily. 01/05/16  Yes [provider]  rosuvastatin (CRESTOR) 20 MG tablet Take 1 tablet (20 mg total) by mouth at bedtime. 03/20/16  Yes Gabriel Cirri, NP  traMADol (ULTRAM) 50 MG tablet Take 1 tablet (50 mg total) by mouth every 8 (eight) hours as needed. 05/31/16  Yes Gabriel Cirri, NP  Stann Ore SHORT PEN NEEDLES 31G X 8 MM MISC  01/31/16  Yes [provider]  Vitamin D, Ergocalciferol, (DRISDOL) 50000 units CAPS capsule TAKE ONE CAPSULE BY MOUTH MONTHLY 01/29/17  Yes Johnson, Megan P, DO  etodolac (LODINE) 200 MG capsule Take 1 capsule (200 mg total) by mouth every 8 (eight) hours. Patient not taking: Reported on 03/28/2017 12/27/16   Rebecka Apley, MD  metFORMIN (GLUCOPHAGE-XR) 750 MG 24 hr tablet TAKE 2 TABLETS (1,500 MG TOTAL) BY MOUTH DAILY WITH BREAKFAST. Patient not taking: Reported on 03/28/2017 12/01/16   Gabriel Cirri, NP  metoprolol tartrate (LOPRESSOR) 25 MG tablet Take 3 tablets (75 mg total) by mouth 2 (two) times daily. Patient not taking: Reported on 03/28/2017 05/31/16   Gabriel Cirri, NP  montelukast (SINGULAIR) 10 MG tablet Take 1 tablet (10 mg total) by mouth daily. Patient not taking: Reported on 03/28/2017 03/20/16   Gabriel Cirri, NP  nitroGLYCERIN (NITRODUR - DOSED IN MG/24 HR) 0.2 mg/hr patch APPLY 1 PATCH ONTO THE SKIN ONCE DAILY Patient not taking: Reported on 03/28/2017 09/01/16   Particia Nearing, PA-C     Allergies Alprazolam; Demerol [meperidine]; and Fentanyl   Family History  Problem Relation Age of Onset  . Breast cancer  Maternal Aunt 40  . Cancer Mother        colon  . Aneurysm Father     Social History Social History   Tobacco Use  . Smoking status: Current Every Day Smoker    Packs/day: 0.75    Years: 50.00    Pack years: 37.50    Types: Cigarettes  . Smokeless tobacco: Never Used  Substance Use Topics  . Alcohol use: No  . Drug use: No    Review of Systems  Constitutional:   No fever or chills.  ENT:   No sore throat. No rhinorrhea. Cardiovascular: Positive as above chest pain without syncope. Respiratory:   No dyspnea or cough. Gastrointestinal:   Negative for abdominal pain, vomiting and diarrhea.  Musculoskeletal:   Negative for focal pain or swelling All other systems reviewed and are negative except as documented above in ROS and HPI.  ____________________________________________   PHYSICAL EXAM:  VITAL SIGNS: ED Triage  Vitals  Enc Vitals Group     BP 03/28/17 1440 (!) 151/110     Pulse Rate 03/28/17 1440 70     Resp 03/28/17 1440 20     Temp 03/28/17 1440 98.3 F (36.8 C)     Temp Source 03/28/17 1440 Oral     SpO2 03/28/17 1440 100 %     Weight 03/28/17 1441 135 lb (61.2 kg)     Height 03/28/17 1441 5\' 1"  (1.549 m)     Head Circumference --      Peak Flow --      Pain Score 03/28/17 1440 6     Pain Loc --      Pain Edu? --      Excl. in GC? --     Vital signs reviewed, nursing assessments reviewed.   Constitutional:   Alert and oriented. Well appearing and in no distress. Eyes:   No scleral icterus.  EOMI. No nystagmus. No conjunctival pallor. PERRL. ENT   Head:   Normocephalic and atraumatic.   Nose:   No congestion/rhinnorhea.    Mouth/Throat:   MMM, no pharyngeal erythema. No peritonsillar mass.    Neck:   No meningismus. Full ROM. Hematological/Lymphatic/Immunilogical:   No cervical lymphadenopathy. Cardiovascular:   RRR. Symmetric bilateral radial and DP pulses.  No murmurs.  Respiratory:   Normal respiratory effort without  tachypnea/retractions. Breath sounds are clear and equal bilaterally. No wheezes/rales/rhonchi. Gastrointestinal:   Soft and nontender. Non distended. There is no CVA tenderness.  No rebound, rigidity, or guarding. Genitourinary:   deferred Musculoskeletal:   Normal range of motion in all extremities. No joint effusions.  No lower extremity tenderness.  No edema. Neurologic:   Normal speech and language.  Motor grossly intact. No acute focal neurologic deficits are appreciated.  Skin:    Skin is warm, dry and intact. No rash noted.  No petechiae, purpura, or bullae.  ____________________________________________    LABS (pertinent positives/negatives) (all labs ordered are listed, but only abnormal results are displayed) Labs Reviewed  BASIC METABOLIC PANEL - Abnormal; Notable for the following components:      Result Value   Glucose, Bld 147 (*)    Calcium 8.7 (*)    All other components within normal limits  CBC - Abnormal; Notable for the following components:   WBC 12.5 (*)    RDW 15.1 (*)    All other components within normal limits  TROPONIN I   ____________________________________________   EKG  Interpreted by me Atrial paced rhythm, rate of 70. Normal axis, normal intervals. Right bundle-branch block. No acute ischemic changes.  ____________________________________________    RADIOLOGY  Dg Chest 2 View  Result Date: 03/28/2017 CLINICAL DATA:  72 year old with central chest heaviness. EXAM: CHEST  2 VIEW COMPARISON:  04/02/2015 FINDINGS: Stable position of the left dual chamber cardiac pacemaker. Heart size is normal. Lungs are clear without focal airspace disease or pulmonary edema. Surgical anchors in the right humeral head. No pleural effusions. IMPRESSION: No active cardiopulmonary disease. Electronically Signed   By: Richarda Overlie M.D.   On: 03/28/2017 15:00   Ct Angio Chest Aorta W And/or Wo Contrast  Result Date: 03/28/2017 CLINICAL DATA:  Chest pain and  shortness of breath EXAM: CT ANGIOGRAPHY CHEST WITH CONTRAST TECHNIQUE: Multidetector CT imaging of the chest was performed using the standard protocol during bolus administration of intravenous contrast. Multiplanar CT image reconstructions and MIPs were obtained to evaluate the vascular anatomy. CONTRAST:  75mL ISOVUE-370 IOPAMIDOL (ISOVUE-370) INJECTION  76% COMPARISON:  Chest radiograph 03/28/2017 Chest CT 11/16/2015 FINDINGS: Cardiovascular: There is no central pulmonary embolus. The main pulmonary artery is within normal limits for size. There is a normal 3-vessel arch branching pattern without evidence of acute aortic syndrome. There is mild aortic atherosclerosis. Heart size is normal, without pericardial effusion. Pacemaker leads in the right atrium and right ventricle. Mediastinum/Nodes: No mediastinal, hilar or axillary lymphadenopathy. The visualized thyroid and thoracic esophageal course are unremarkable. Lungs/Pleura: No pulmonary nodules or masses. No pleural effusion or pneumothorax. No focal airspace consolidation. No focal pleural abnormality. Upper Abdomen: Contrast bolus timing is not optimized for evaluation of the abdominal organs. Within this limitation, the visualized organs of the upper abdomen are normal. Musculoskeletal: No chest wall abnormality. No acute or significant osseous findings. Review of the MIP images confirms the above findings. IMPRESSION: 1. No acute aortic syndrome, central pulmonary embolus or other acute thoracic abnormality. 2.  Aortic Atherosclerosis (ICD10-I70.0). Electronically Signed   By: Deatra Robinson M.D.   On: 03/28/2017 17:04    ____________________________________________   PROCEDURES Procedures  ____________________________________________    CLINICAL IMPRESSION / ASSESSMENT AND PLAN / ED COURSE  Pertinent labs & imaging results that were available during my care of the patient were reviewed by me and considered in my medical decision making (see  chart for details).   Patient presents with chest pain. She is not in distress, EKG nondiagnostic. Presentation is not consistent with unstable angina or non-STEMI given her negative troponin, but she is at elevated risk for cardiac etiology, so case was discussed with the hospitalist for further evaluation. CT of the chest was obtained to evaluate for aortic dissection given the radiating pain, this was negative and did not show any acute findings.      ____________________________________________   FINAL CLINICAL IMPRESSION(S) / ED DIAGNOSES    Final diagnoses:  Chest pain with moderate risk for cardiac etiology       Portions of this note were generated with dragon dictation software. Dictation errors may occur despite best attempts at proofreading.    Sharman Cheek, MD 03/28/17 2045

## 2017-03-28 NOTE — ED Triage Notes (Signed)
Here for central chest heaviness since yesterday that radiates across both shoulders and arms.  Mild SHOB.  Has had cough, no fever.  No vomiting.  VSS.  Unlabored but appears anxious.

## 2017-03-28 NOTE — H&P (Signed)
[facility] - [place] at [facility]   PATIENT NAME: Melanie King    MR#:  [id number]  DATE OF BIRTH:  1945/06/23  DATE OF ADMISSION:  03/28/2017  PRIMARY CARE PHYSICIAN: Marletta Lor, NP   REQUESTING/REFERRING PHYSICIAN: Scotty Court, MD  CHIEF COMPLAINT:   Chief Complaint  Patient presents with  . Chest Pain    HISTORY OF PRESENT ILLNESS:  Melanie Plitt  is a 72 y.o. female who presents with chest discomfort that was felt to the central pressure with radiation to bilateral shoulders.  Patient states this happened earlier today when she was out and about.  She has a history of cardiac disease and sick sinus syndrome.  She states that this pain felt similar to prior cardiac chest pain she does experience though it was much worse.  Her pain eased off and resolved after she was resting here in the ED for some time.  Hospitalists were called for admission and further evaluation  PAST MEDICAL HISTORY:   Past Medical History:  Diagnosis Date  . Arthritis   . CHF (congestive heart failure) (HCC)   . DDD (degenerative disc disease), cervical   . DDD (degenerative disc disease), lumbar   . Depression   . Diabetes mellitus without complication (HCC)   . GERD (gastroesophageal reflux disease)   . Hyperlipidemia   . Hypertension   . Osteoarthritis   . Pacemaker     PAST SURGICAL HISTORY:   Past Surgical History:  Procedure Laterality Date  . ABDOMINAL HYSTERECTOMY    . BREAST BIOPSY Right 1994   neg cyst removed  . CHOLECYSTECTOMY    . EYE SURGERY  1964, 1966, and 1967   bilateral  . FOOT SURGERY Right    cellulitis  . PACEMAKER INSERTION      SOCIAL HISTORY:   Social History   Tobacco Use  . Smoking status: Current Every Day Smoker    Packs/day: 0.75    Years: 50.00    Pack years: 37.50    Types: Cigarettes  . Smokeless tobacco: Never Used  Substance Use Topics  . Alcohol use: No    FAMILY HISTORY:   Family History  Problem Relation  Age of Onset  . Breast cancer Maternal Aunt 40  . Cancer Mother        colon  . Aneurysm Father     DRUG ALLERGIES:   Allergies  Allergen Reactions  . Alprazolam Swelling  . Demerol [Meperidine] Itching  . Fentanyl Other (See Comments)    "burning and hot"    MEDICATIONS AT HOME:   Prior to Admission medications   Medication Sig Start Date End Date Taking? Authorizing Provider  albuterol (PROVENTIL HFA;VENTOLIN HFA) 108 (90 Base) MCG/ACT inhaler Inhale 2 puffs into the lungs every 6 (six) hours as needed for wheezing or shortness of breath.   Yes [provider]  amLODipine (NORVASC) 10 MG tablet TAKE 1 TABLET BY MOUTH EVERY DAY 08/28/16  Yes Gabriel Cirri, NP  aspirin 81 MG EC tablet TAKE 1 TABLET (81 MG TOTAL) BY MOUTH DAILY. 03/15/17  Yes Gabriel Cirri, NP  benztropine (COGENTIN) 1 MG tablet Take 1 mg by mouth 2 (two) times daily.   Yes [provider]  citalopram (CELEXA) 20 MG tablet Take 20 mg by mouth daily.  01/05/16  Yes [provider]  Cyanocobalamin (VITAMIN B-12) 2500 MCG SUBL Place 2,500 mcg under the tongue every other day.   Yes [provider]  docusate sodium (COLACE) 100 MG  capsule TAKE 1 CAPSULE BY MOUTH EVERY DAY 01/02/17  Yes Gabriel Cirri, NP  fluticasone (FLONASE) 50 MCG/ACT nasal spray Place 1 spray into both nostrils daily. 04/14/16  Yes Gabriel Cirri, NP  furosemide (LASIX) 20 MG tablet TAKE 1 TABLET BY MOUTH EVERY DAY FOR FLUID 10/31/16  Yes Gabriel Cirri, NP  gabapentin (NEURONTIN) 300 MG capsule Take 300 mg by mouth 3 (three) times daily.  05/24/16 03/28/17 Yes [provider]  LANTUS SOLOSTAR 100 UNIT/ML Solostar Pen Inject 20 Units into the skin daily. Patient taking differently: Inject 22 Units into the skin daily.  03/20/16  Yes Gabriel Cirri, NP  levothyroxine (SYNTHROID, LEVOTHROID) 125 MCG tablet TAKE 1 TABLET BY MOUTH EVERY DAY 11/01/16  Yes Gabriel Cirri, NP  losartan (COZAAR) 100 MG tablet Take 1  tablet (100 mg total) by mouth daily. 10/26/16  Yes Gabriel Cirri, NP  Multiple Vitamins-Minerals (CERTAVITE SENIOR/ANTIOXIDANT) TABS TAKE 1 TABLET BY MOUTH EVERY OTHER DAY 01/21/16  Yes Gabriel Cirri, NP  nitroGLYCERIN (NITROSTAT) 0.4 MG SL tablet Place 0.4 mg under the tongue every 5 (five) minutes as needed for chest pain.   Yes [provider]  ranitidine (ZANTAC) 150 MG tablet TAKE 1 TABLET (150 MG TOTAL) BY MOUTH 2 (TWO) TIMES DAILY. 06/02/16  Yes Gabriel Cirri, NP  REXULTI 1 MG TABS Take 1 mg by mouth daily. 01/05/16  Yes [provider]  rosuvastatin (CRESTOR) 20 MG tablet Take 1 tablet (20 mg total) by mouth at bedtime. 03/20/16  Yes Gabriel Cirri, NP  traMADol (ULTRAM) 50 MG tablet Take 1 tablet (50 mg total) by mouth every 8 (eight) hours as needed. 05/31/16  Yes Gabriel Cirri, NP  Stann Ore SHORT PEN NEEDLES 31G X 8 MM MISC  01/31/16  Yes [provider]  Vitamin D, Ergocalciferol, (DRISDOL) 50000 units CAPS capsule TAKE ONE CAPSULE BY MOUTH MONTHLY 01/29/17  Yes Johnson, Megan P, DO  etodolac (LODINE) 200 MG capsule Take 1 capsule (200 mg total) by mouth every 8 (eight) hours. Patient not taking: Reported on 03/28/2017 12/27/16   Rebecka Apley, MD  metFORMIN (GLUCOPHAGE-XR) 750 MG 24 hr tablet TAKE 2 TABLETS (1,500 MG TOTAL) BY MOUTH DAILY WITH BREAKFAST. Patient not taking: Reported on 03/28/2017 12/01/16   Gabriel Cirri, NP  metoprolol tartrate (LOPRESSOR) 25 MG tablet Take 3 tablets (75 mg total) by mouth 2 (two) times daily. Patient not taking: Reported on 03/28/2017 05/31/16   Gabriel Cirri, NP  montelukast (SINGULAIR) 10 MG tablet Take 1 tablet (10 mg total) by mouth daily. Patient not taking: Reported on 03/28/2017 03/20/16   Gabriel Cirri, NP  nitroGLYCERIN (NITRODUR - DOSED IN MG/24 HR) 0.2 mg/hr patch APPLY 1 PATCH ONTO THE SKIN ONCE DAILY Patient not taking: Reported on 03/28/2017 09/01/16   Particia Nearing, PA-C    REVIEW OF SYSTEMS:   Review of Systems  Constitutional: Negative for chills, fever, malaise/fatigue and weight loss.  HENT: Negative for ear pain, hearing loss and tinnitus.   Eyes: Negative for blurred vision, double vision, pain and redness.  Respiratory: Negative for cough, hemoptysis and shortness of breath.   Cardiovascular: Positive for chest pain. Negative for palpitations, orthopnea and leg swelling.  Gastrointestinal: Negative for abdominal pain, constipation, diarrhea, nausea and vomiting.  Genitourinary: Negative for dysuria, frequency and hematuria.  Musculoskeletal: Negative for back pain, joint pain and neck pain.  Skin:       No acne, rash, or lesions  Neurological: Negative for dizziness, tremors, focal weakness and weakness.  Endo/Heme/Allergies:  Negative for polydipsia. Does not bruise/bleed easily.  Psychiatric/Behavioral: Negative for depression. The patient is not nervous/anxious and does not have insomnia.      VITAL SIGNS:   Vitals:   03/28/17 1440 03/28/17 1441 03/28/17 1841 03/28/17 2000  BP: (!) 151/110  (!) 148/81 (!) 171/69  Pulse: 70  70   Resp: 20  20 14   Temp: 98.3 F (36.8 C)     TempSrc: Oral     SpO2: 100%  100%   Weight:  61.2 kg (135 lb)    Height:  5\' 1"  (1.549 m)     Wt Readings from Last 3 Encounters:  03/28/17 61.2 kg (135 lb)  12/27/16 61.2 kg (135 lb)  05/31/16 62 kg (136 lb 9.6 oz)    PHYSICAL EXAMINATION:  Physical Exam  Vitals reviewed. Constitutional: She is oriented to person, place, and time. She appears well-developed and well-nourished. No distress.  HENT:  Head: Normocephalic and atraumatic.  Mouth/Throat: Oropharynx is clear and moist.  Eyes: Conjunctivae and EOM are normal. Pupils are equal, round, and reactive to light. No scleral icterus.  Neck: Normal range of motion. Neck supple. No JVD present. No thyromegaly present.  Cardiovascular: Normal rate, regular rhythm and intact distal pulses. Exam reveals no gallop and no friction rub.   No murmur heard. Respiratory: Effort normal and breath sounds normal. No respiratory distress. She has no wheezes. She has no rales.  GI: Soft. Bowel sounds are normal. She exhibits no distension. There is no tenderness.  Musculoskeletal: Normal range of motion. She exhibits no edema.  No arthritis, no gout  Lymphadenopathy:    She has no cervical adenopathy.  Neurological: She is alert and oriented to person, place, and time. No cranial nerve deficit.  No dysarthria, no aphasia  Skin: Skin is warm and dry. No rash noted. No erythema.  Psychiatric: She has a normal mood and affect. Her behavior is normal. Judgment and thought content normal.    LABORATORY PANEL:   CBC Recent Labs  Lab 03/28/17 1437  WBC 12.5*  HGB 13.4  HCT 40.2  PLT 257   ------------------------------------------------------------------------------------------------------------------  Chemistries  Recent Labs  Lab 03/28/17 1437  NA 139  K 4.4  CL 108  CO2 23  GLUCOSE 147*  BUN 17  CREATININE 0.92  CALCIUM 8.7*   ------------------------------------------------------------------------------------------------------------------  Cardiac Enzymes Recent Labs  Lab 03/28/17 1437  TROPONINI <0.03   ------------------------------------------------------------------------------------------------------------------  RADIOLOGY:  Dg Chest 2 View  Result Date: 03/28/2017 CLINICAL DATA:  72 year old with central chest heaviness. EXAM: CHEST  2 VIEW COMPARISON:  04/02/2015 FINDINGS: Stable position of the left dual chamber cardiac pacemaker. Heart size is normal. Lungs are clear without focal airspace disease or pulmonary edema. Surgical anchors in the right humeral head. No pleural effusions. IMPRESSION: No active cardiopulmonary disease. Electronically Signed   By: Richarda Overlie M.D.   On: 03/28/2017 15:00   Ct Angio Chest Aorta W And/or Wo Contrast  Result Date: 03/28/2017 CLINICAL DATA:  Chest pain and  shortness of breath EXAM: CT ANGIOGRAPHY CHEST WITH CONTRAST TECHNIQUE: Multidetector CT imaging of the chest was performed using the standard protocol during bolus administration of intravenous contrast. Multiplanar CT image reconstructions and MIPs were obtained to evaluate the vascular anatomy. CONTRAST:  75mL ISOVUE-370 IOPAMIDOL (ISOVUE-370) INJECTION 76% COMPARISON:  Chest radiograph 03/28/2017 Chest CT 11/16/2015 FINDINGS: Cardiovascular: There is no central pulmonary embolus. The main pulmonary artery is within normal limits for size. There is a normal 3-vessel arch branching pattern without  evidence of acute aortic syndrome. There is mild aortic atherosclerosis. Heart size is normal, without pericardial effusion. Pacemaker leads in the right atrium and right ventricle. Mediastinum/Nodes: No mediastinal, hilar or axillary lymphadenopathy. The visualized thyroid and thoracic esophageal course are unremarkable. Lungs/Pleura: No pulmonary nodules or masses. No pleural effusion or pneumothorax. No focal airspace consolidation. No focal pleural abnormality. Upper Abdomen: Contrast bolus timing is not optimized for evaluation of the abdominal organs. Within this limitation, the visualized organs of the upper abdomen are normal. Musculoskeletal: No chest wall abnormality. No acute or significant osseous findings. Review of the MIP images confirms the above findings. IMPRESSION: 1. No acute aortic syndrome, central pulmonary embolus or other acute thoracic abnormality. 2.  Aortic Atherosclerosis (ICD10-I70.0). Electronically Signed   By: Deatra Robinson M.D.   On: 03/28/2017 17:04    EKG:   Orders placed or performed during the hospital encounter of 03/28/17  . ED EKG within 10 minutes  . ED EKG within 10 minutes  . EKG 12-Lead  . EKG 12-Lead    IMPRESSION AND PLAN:  Principal Problem:   Angina pectoris (HCC) -trend cardiac enzymes, get an echocardiogram and a cardiology consult. Active Problems:   CAD  (coronary artery disease) -continue home meds, other workup as above   COPD (chronic obstructive pulmonary disease) (HCC) -home dose inhalers   Hypertension -stable, continue home meds   Diabetes (HCC) -sliding scale insulin with corresponding glucose checks   HLD (hyperlipidemia) -home dose anti-lipids  All the records are reviewed and case discussed with ED provider. Management plans discussed with the patient and/or family.  DVT PROPHYLAXIS: SubQ lovenox  GI PROPHYLAXIS: None  ADMISSION STATUS: Observation  CODE STATUS: Full Code Status History    This patient does not have a recorded code status. Please follow your organizational policy for patients in this situation.      TOTAL TIME TAKING CARE OF THIS PATIENT: 40 minutes.   Tina Temme FIELDING 03/28/2017, 8:54 PM  [facility]  Office  [phone]  CC: Primary care physician; Marletta Lor, NP  Note:  This document was prepared using Dragon voice recognition software and may include unintentional dictation errors.

## 2017-03-29 ENCOUNTER — Observation Stay
Admit: 2017-03-29 | Discharge: 2017-03-29 | Disposition: A | Discharge status: Home / Self Care | Payer: Medicare Other | Attending: Internal Medicine | Admitting: Internal Medicine

## 2017-03-29 DIAGNOSIS — I208 Other forms of angina pectoris: Secondary | ICD-10-CM | POA: Diagnosis not present

## 2017-03-29 DIAGNOSIS — I209 Angina pectoris, unspecified: Secondary | ICD-10-CM | POA: Diagnosis not present

## 2017-03-29 DIAGNOSIS — I2510 Atherosclerotic heart disease of native coronary artery without angina pectoris: Secondary | ICD-10-CM | POA: Diagnosis not present

## 2017-03-29 DIAGNOSIS — I10 Essential (primary) hypertension: Secondary | ICD-10-CM | POA: Diagnosis not present

## 2017-03-29 DIAGNOSIS — J449 Chronic obstructive pulmonary disease, unspecified: Secondary | ICD-10-CM | POA: Diagnosis not present

## 2017-03-29 DIAGNOSIS — R079 Chest pain, unspecified: Secondary | ICD-10-CM | POA: Diagnosis not present

## 2017-03-29 DIAGNOSIS — I25110 Atherosclerotic heart disease of native coronary artery with unstable angina pectoris: Secondary | ICD-10-CM | POA: Diagnosis not present

## 2017-03-29 LAB — GLUCOSE, CAPILLARY
GLUCOSE-CAPILLARY: 290 mg/dL — AB (ref 65–99)
GLUCOSE-CAPILLARY: 117 mg/dL — AB (ref 65–99)
Glucose-Capillary: 111 mg/dL — ABNORMAL HIGH (ref 65–99)
Glucose-Capillary: 297 mg/dL — ABNORMAL HIGH (ref 65–99)
Glucose-Capillary: 187 mg/dL — ABNORMAL HIGH (ref 65–99)

## 2017-03-29 LAB — ECHOCARDIOGRAM COMPLETE
Height: 61 in
Weight: 2121.6 oz

## 2017-03-29 LAB — BASIC METABOLIC PANEL
Anion gap: 11 (ref 5–15)
BUN: 15 mg/dL (ref 6–20)
CHLORIDE: 108 mmol/L (ref 101–111)
CO2: 21 mmol/L — AB (ref 22–32)
CREATININE: 0.77 mg/dL (ref 0.44–1.00)
Calcium: 8.7 mg/dL — ABNORMAL LOW (ref 8.9–10.3)
GFR calc non Af Amer: >60 mL/min (ref >60)
Glucose, Bld: 140 mg/dL — ABNORMAL HIGH (ref 65–99)
Potassium: 4.1 mmol/L (ref 3.5–5.1)
Sodium: 140 mmol/L (ref 135–145)

## 2017-03-29 LAB — TROPONIN I
Troponin I: <0.03 ng/mL (ref <0.03)
Troponin I: <0.03 ng/mL (ref <0.03)

## 2017-03-29 LAB — HEMOGLOBIN A1C
HEMOGLOBIN A1C: 8.3 % — AB (ref 4.8–5.6)
MEAN PLASMA GLUCOSE: 191.51 mg/dL

## 2017-03-29 LAB — CBC
HEMATOCRIT: 36 % (ref 35.0–47.0)
HEMOGLOBIN: 12.4 g/dL (ref 12.0–16.0)
MCH: 31 pg (ref 26.0–34.0)
MCHC: 34.5 g/dL (ref 32.0–36.0)
MCV: 89.8 fL (ref 80.0–100.0)
PLATELETS: 268 10*3/uL (ref 150–440)
RBC: 4.01 MIL/uL (ref 3.80–5.20)
RDW: 14.7 % — ABNORMAL HIGH (ref 11.5–14.5)
WBC: 9.7 10*3/uL (ref 3.6–11.0)

## 2017-03-29 MED ORDER — IPRATROPIUM-ALBUTEROL 0.5-2.5 (3) MG/3ML IN SOLN: 3.0000 mL | Freq: Four times a day (QID) | RESPIRATORY_TRACT | Status: DC | PRN

## 2017-03-29 MED ORDER — SODIUM CHLORIDE 0.9% FLUSH
3.0000 mL | Freq: Two times a day (BID) | INTRAVENOUS | Status: DC
  Administered 2017-03-29 – 2017-03-30 (×2): 3 mL via INTRAVENOUS

## 2017-03-29 NOTE — Care Management Note (Signed)
Case Management Note  Patient Details  Name: Melanie King MRN: [id number] Date of Birth: 01/31/1946  Subjective/Objective:    Admitted to [facility] with the diagnosis of angina under observation status. Lives with friend, [name] [phone]).    Seen Dr. Teola Bradley before Thanksgiving. Prescriptions are filled at [facility] in [place].   [facility] about 2.5 years ago.     Doesn't remember name of agency. 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 No home oxygen. The only medical equipment is a bed slid. Takes care of all basic activities of daily can, doesn't drive. Decreased appetite. Lost 10--12 pounds  X 4 years. Last fall was around the 1st of January.    [facility] member in the past. Friend will transport     Action/Plan: Will continue to follow for discharge plans  Expected Discharge Date:                  Expected Discharge Plan:     In-House Referral:   yes  Discharge planning Services     Post Acute Care Choice:    Choice offered to:     DME Arranged:    DME Agency:     HH Arranged:    HH Agency:     Status of Service:     If discussed at Long Length of Stay Meetings, dates discussed:    Additional Comments:  Gwenette Greet, RN MSN CCM Care Management [phone] 03/29/2017, 11:21 AM

## 2017-03-29 NOTE — Plan of Care (Signed)
  Clinical Measurements: Ability to maintain clinical measurements within normal limits will improve 03/29/2017 1543 - Not Progressing by Raynald Blend, RN Note Hemoglobin A1C today = elevated at 8.3. Will continue to monitor diabetic status while admitted. Melanie King [facility]

## 2017-03-29 NOTE — Consult Note (Signed)
Reason for Consult: Unstable angina coronary artery disease Referring Physician: Alvester Chou nurse practitioner, Cardiologist [facility]  Melanie King is an 72 y.o. female.  HPI: Patient presents with chest discomfort chest pressure radiating to the arm and neck.  Patient has a known history of coronary disease with PCI and stent in the past.  Patient also has history of sick sinus syndrome status post permanent pacemaker placement.  Patient had recurrent symptoms he came to the emergency room as of admitted for further assessment and care.  Last cath was almost 10 years ago when she lived in [place] patient still smokes states to be compliant with her medication diabetes is marginally controlled here for routine evaluation with unstable angina symptoms  Past Medical History:  Diagnosis Date  . Arthritis   . CHF (congestive heart failure) ([place])   . DDD (degenerative disc disease), cervical   . DDD (degenerative disc disease), lumbar   . Depression   . Diabetes mellitus without complication ([place])   . GERD (gastroesophageal reflux disease)   . Hyperlipidemia   . Hypertension   . Osteoarthritis   . Pacemaker     Past Surgical History:  Procedure Laterality Date  . ABDOMINAL HYSTERECTOMY    . BREAST BIOPSY Right 1994   neg cyst removed  . CHOLECYSTECTOMY    . EYE SURGERY  1964, 1966, and 1967   bilateral  . FOOT SURGERY Right    cellulitis  . PACEMAKER INSERTION      Family History  Problem Relation Age of Onset  . Breast cancer Maternal Aunt 5  . Cancer Mother        colon  . Aneurysm Father     Social History:  reports that she has been smoking cigarettes.  She has a 37.50 pack-year smoking history. she has never used smokeless tobacco. She reports that she does not drink alcohol or use drugs.  Allergies:  Allergies  Allergen Reactions  . Alprazolam Swelling  . Demerol [Meperidine] Itching  . Fentanyl Other (See Comments)    "burning and hot"    Medications: I  have reviewed the patient's current medications.  Results for orders placed or performed during the hospital encounter of 03/28/17 (from the past 48 hour(s))  Basic metabolic panel     Status: Abnormal   Collection Time: 03/28/17  2:37 PM  Result Value Ref Range   Sodium 139 135 - 145 mmol/L   Potassium 4.4 3.5 - 5.1 mmol/L   Chloride 108 101 - 111 mmol/L   CO2 23 22 - 32 mmol/L   Glucose, Bld 147 (H) 65 - 99 mg/dL   BUN 17 6 - 20 mg/dL   Creatinine, Ser 0.92 0.44 - 1.00 mg/dL   Calcium 8.7 (L) 8.9 - 10.3 mg/dL   GFR calc non Af Amer >60 >60 mL/min   GFR calc Af Amer >60 >60 mL/min    Comment: (NOTE) The eGFR has been calculated using the CKD EPI equation. This calculation has not been validated in all clinical situations. eGFR's persistently <60 mL/min signify possible Chronic Kidney Disease.    Anion gap 8 5 - 15    Comment: Performed at [facility], [address] [place], [place] [address]  CBC     Status: Abnormal   Collection Time: 03/28/17  2:37 PM  Result Value Ref Range   WBC 12.5 (H) 3.6 - 11.0 K/uL   RBC 4.37 3.80 - 5.20 MIL/uL   Hemoglobin 13.4 12.0 - 16.0 g/dL  HCT 40.2 35.0 - 47.0 %   MCV 92.0 80.0 - 100.0 fL   MCH 30.7 26.0 - 34.0 pg   MCHC 33.4 32.0 - 36.0 g/dL   RDW 15.1 (H) 11.5 - 14.5 %   Platelets 257 150 - 440 K/uL    Comment: Performed at [facility], [address] [place], [place] [address]  Troponin I     Status: None   Collection Time: 03/28/17  2:37 PM  Result Value Ref Range   Troponin I <0.03 <0.03 ng/mL    Comment: Performed at [facility], [address] [place], [place] [address]  Troponin I     Status: None   Collection Time: 03/28/17 10:33 PM  Result Value Ref Range   Troponin I <0.03 <0.03 ng/mL    Comment: Performed at [facility], [address] [place], [place] [address]  Glucose, capillary     Status: Abnormal   Collection Time: 03/29/17  1:08 AM  Result Value Ref Range    Glucose-Capillary 290 (H) 65 - 99 mg/dL   Comment 1 Notify RN    Comment 2 Document in Chart   Troponin I     Status: None   Collection Time: 03/29/17  4:15 AM  Result Value Ref Range   Troponin I <0.03 <0.03 ng/mL    Comment: Performed at [facility], [address] [place], [place] [address]  Basic metabolic panel     Status: Abnormal   Collection Time: 03/29/17  4:15 AM  Result Value Ref Range   Sodium 140 135 - 145 mmol/L   Potassium 4.1 3.5 - 5.1 mmol/L   Chloride 108 101 - 111 mmol/L   CO2 21 (L) 22 - 32 mmol/L   Glucose, Bld 140 (H) 65 - 99 mg/dL   BUN 15 6 - 20 mg/dL   Creatinine, Ser 0.77 0.44 - 1.00 mg/dL   Calcium 8.7 (L) 8.9 - 10.3 mg/dL   GFR calc non Af Amer >60 >60 mL/min   GFR calc Af Amer >60 >60 mL/min    Comment: (NOTE) The eGFR has been calculated using the CKD EPI equation. This calculation has not been validated in all clinical situations. eGFR's persistently <60 mL/min signify possible Chronic Kidney Disease.    Anion gap 11 5 - 15    Comment: Performed at [facility], [address] [place], [place] [address]  CBC     Status: Abnormal   Collection Time: 03/29/17  4:15 AM  Result Value Ref Range   WBC 9.7 3.6 - 11.0 K/uL   RBC 4.01 3.80 - 5.20 MIL/uL   Hemoglobin 12.4 12.0 - 16.0 g/dL   HCT 36.0 35.0 - 47.0 %   MCV 89.8 80.0 - 100.0 fL   MCH 31.0 26.0 - 34.0 pg   MCHC 34.5 32.0 - 36.0 g/dL   RDW 14.7 (H) 11.5 - 14.5 %   Platelets 268 150 - 440 K/uL    Comment: Performed at [facility], [address] [place],  [address]  Hemoglobin A1c     Status: Abnormal   Collection Time: 03/29/17  4:15 AM  Result Value Ref Range   Hgb A1c MFr Bld 8.3 (H) 4.8 - 5.6 %    Comment: (NOTE) Pre diabetes:          5.7%-6.4% Diabetes:              >6.4% Glycemic control for   <7.0% adults with diabetes  Mean Plasma Glucose 191.51 mg/dL    Comment: Performed at [facility] [address] [place], [place]  [address]  Glucose, capillary     Status: Abnormal   Collection Time: 03/29/17  6:07 AM  Result Value Ref Range   Glucose-Capillary 111 (H) 65 - 99 mg/dL  Glucose, capillary     Status: Abnormal   Collection Time: 03/29/17  7:53 AM  Result Value Ref Range   Glucose-Capillary 117 (H) 65 - 99 mg/dL  Troponin I     Status: None   Collection Time: 03/29/17 10:00 AM  Result Value Ref Range   Troponin I <0.03 <0.03 ng/mL    Comment: Performed at [facility], [address] [place], [place] [address]  Glucose, capillary     Status: Abnormal   Collection Time: 03/29/17 12:01 PM  Result Value Ref Range   Glucose-Capillary 297 (H) 65 - 99 mg/dL    Dg Chest 2 View  Result Date: 03/28/2017 CLINICAL DATA:  72 year old with central chest heaviness. EXAM: CHEST  2 VIEW COMPARISON:  04/02/2015 FINDINGS: Stable position of the left dual chamber cardiac pacemaker. Heart size is normal. Lungs are clear without focal airspace disease or pulmonary edema. Surgical anchors in the right humeral head. No pleural effusions. IMPRESSION: No active cardiopulmonary disease. Electronically Signed   By: Markus Daft M.D.   On: 03/28/2017 15:00   Ct Angio Chest Aorta W And/or Wo Contrast  Result Date: 03/28/2017 CLINICAL DATA:  Chest pain and shortness of breath EXAM: CT ANGIOGRAPHY CHEST WITH CONTRAST TECHNIQUE: Multidetector CT imaging of the chest was performed using the standard protocol during bolus administration of intravenous contrast. Multiplanar CT image reconstructions and MIPs were obtained to evaluate the vascular anatomy. CONTRAST:  58m ISOVUE-370 IOPAMIDOL (ISOVUE-370) INJECTION 76% COMPARISON:  Chest radiograph 03/28/2017 Chest CT 11/16/2015 FINDINGS: Cardiovascular: There is no central pulmonary embolus. The main pulmonary artery is within normal limits for size. There is a normal 3-vessel arch branching pattern without evidence of acute aortic syndrome. There is mild aortic atherosclerosis. Heart  size is normal, without pericardial effusion. Pacemaker leads in the right atrium and right ventricle. Mediastinum/Nodes: No mediastinal, hilar or axillary lymphadenopathy. The visualized thyroid and thoracic esophageal course are unremarkable. Lungs/Pleura: No pulmonary nodules or masses. No pleural effusion or pneumothorax. No focal airspace consolidation. No focal pleural abnormality. Upper Abdomen: Contrast bolus timing is not optimized for evaluation of the abdominal organs. Within this limitation, the visualized organs of the upper abdomen are normal. Musculoskeletal: No chest wall abnormality. No acute or significant osseous findings. Review of the MIP images confirms the above findings. IMPRESSION: 1. No acute aortic syndrome, central pulmonary embolus or other acute thoracic abnormality. 2.  Aortic Atherosclerosis (ICD10-I70.0). Electronically Signed   By: KUlyses JarredM.D.   On: 03/28/2017 17:04    Review of Systems  Constitutional: Positive for diaphoresis and malaise/fatigue.  HENT: Positive for congestion and sinus pain.   Eyes: Negative.   Respiratory: Positive for shortness of breath.   Cardiovascular: Positive for chest pain, palpitations, orthopnea and PND.  Gastrointestinal: Positive for heartburn.  Genitourinary: Negative.   Musculoskeletal: Positive for myalgias.  Skin: Negative.   Neurological: Positive for weakness.  Endo/Heme/Allergies: Negative.   Psychiatric/Behavioral: Positive for depression. The patient is nervous/anxious.    Blood pressure (!) 167/80, pulse 70, temperature 97.8 F (36.6 C), temperature source Oral, resp. rate 18, height _0  (1.549 m), weight 132 lb 9.6 oz (60.1 kg), SpO2 99 %. Physical  Exam  Nursing note and vitals reviewed. Constitutional: She is oriented to person, place, and time. She appears well-developed and well-nourished.  HENT:  Head: Normocephalic and atraumatic.  Eyes: Conjunctivae and EOM are normal. Pupils are equal, round, and  reactive to light.  Neck: Normal range of motion. Neck supple.  Cardiovascular: Normal rate, regular rhythm and normal heart sounds.  Respiratory: Effort normal and breath sounds normal.  GI: Soft. Bowel sounds are normal.  Musculoskeletal: Normal range of motion.  Neurological: She is alert and oriented to person, place, and time. She has normal reflexes.  Skin: Skin is warm and dry.  Psychiatric: She has a normal mood and affect.    Assessment/Plan: Unstable angina Disease COPD Hypertension Diabetes Hyperlipidemia Permanent pacemaker Congestive heart failure GERD . Plan Agree with admission to telemetry For myocardial infarction Continue telemetry Follow-up EKGs and troponins Consider pacemaker interrogation Continue diabetes monitoring and control Consider functional study We will proceed with cardiac cath if symptoms persist Continue blood pressure management Inhaler therapy for shortness of breath    Batu Cassin D Mega Kinkade 03/29/2017, 1:07 PM

## 2017-03-29 NOTE — Plan of Care (Signed)
Patient is hard of hearing and has memory impairment. Provide education when family is available. Patient states she forgets to take her medication at home. Place a case manager consults to assess for home needs.  Patient ignores instructions for assistance with ambulation.  Place bed alarm onto a more sensitive setting.

## 2017-03-29 NOTE — Progress Notes (Signed)
CH rounding on unit. CH spoke with patient. Patient stated that she is being taken very good care of on the unit and only needs prayer. CH provided a short devotional and prayer for the patient. CH will follow-up with a prayer shawl as patient would like to share a story with the [facility]. Patient received a phone call so CH left the room.

## 2017-03-29 NOTE — Care Management Obs Status (Signed)
MEDICARE OBSERVATION STATUS NOTIFICATION   Patient Details  Name: [place] F Prest MRN: [id number] Date of Birth: 24-Jul-1945   Medicare Observation Status Notification Given:  Yes    Gwenette Greet, RN 03/29/2017, 11:18 AM

## 2017-03-29 NOTE — Progress Notes (Signed)
CH follow-up with patient for prayer and to deliver a prayer shawl. Patient was in room with [name], her partner and a Psychologist, occupational. CH provided prayer and left and will follow-up per patient request.

## 2017-03-29 NOTE — Progress Notes (Signed)
*  PRELIMINARY RESULTS* Echocardiogram 2D Echocardiogram has been performed.  Melanie King 03/29/2017, 11:47 AM

## 2017-03-29 NOTE — Progress Notes (Signed)
[employer] [facility] [place] [facility]   PATIENT NAME: Melanie King    MR#:  [id number]  DATE OF BIRTH:  Apr 02, 1945  SUBJECTIVE:  CHIEF COMPLAINT:   Chief Complaint  Patient presents with  . Chest Pain     Came with intermittent chest pain   Better now. Plan for cardiac cath tomorrow.  REVIEW OF SYSTEMS:  CONSTITUTIONAL: No fever, fatigue or weakness.  EYES: No blurred or double vision.  EARS, NOSE, AND THROAT: No tinnitus or ear pain.  RESPIRATORY: No cough, shortness of breath, wheezing or hemoptysis.  CARDIOVASCULAR: No chest pain, orthopnea, edema.  GASTROINTESTINAL: No nausea, vomiting, diarrhea or abdominal pain.  GENITOURINARY: No dysuria, hematuria.  ENDOCRINE: No polyuria, nocturia,  HEMATOLOGY: No anemia, easy bruising or bleeding SKIN: No rash or lesion. MUSCULOSKELETAL: No joint pain or arthritis.   NEUROLOGIC: No tingling, numbness, weakness.  PSYCHIATRY: No anxiety or depression.   ROS  DRUG ALLERGIES:   Allergies  Allergen Reactions  . Alprazolam Swelling  . Demerol [Meperidine] Itching  . Fentanyl Other (See Comments)    "burning and hot"    VITALS:  Blood pressure (!) 164/72, pulse 70, temperature 98.3 F (36.8 C), temperature source Oral, resp. rate 18, height 5\' 1"  (1.549 m), weight 60.1 kg (132 lb 9.6 oz), SpO2 97 %.  PHYSICAL EXAMINATION:  GENERAL:  72 y.o.-year-old patient lying in the bed with no acute distress.  EYES: Pupils equal, round, reactive to light and accommodation. No scleral icterus. Extraocular muscles intact.  HEENT: Head atraumatic, normocephalic. Oropharynx and nasopharynx clear.  NECK:  Supple, no jugular venous distention. No thyroid enlargement, no tenderness.  LUNGS: Normal breath sounds bilaterally, no wheezing, rales,rhonchi or crepitation. No use of accessory muscles of respiration.  CARDIOVASCULAR: S1, S2 normal. No murmurs, rubs, or gallops.  ABDOMEN: Soft, nontender, nondistended. Bowel sounds  present. No organomegaly or mass.  EXTREMITIES: No pedal edema, cyanosis, or clubbing.  NEUROLOGIC: Cranial nerves II through XII are intact. Muscle strength 5/5 in all extremities. Sensation intact. Gait not checked.  PSYCHIATRIC: The patient is alert and oriented x 3.  SKIN: No obvious rash, lesion, or ulcer.   Physical Exam LABORATORY PANEL:   CBC Recent Labs  Lab 03/29/17 0415  WBC 9.7  HGB 12.4  HCT 36.0  PLT 268   ------------------------------------------------------------------------------------------------------------------  Chemistries  Recent Labs  Lab 03/29/17 0415  NA 140  K 4.1  CL 108  CO2 21*  GLUCOSE 140*  BUN 15  CREATININE 0.77  CALCIUM 8.7*   ------------------------------------------------------------------------------------------------------------------  Cardiac Enzymes Recent Labs  Lab 03/29/17 0415 03/29/17 1000  TROPONINI <0.03 <0.03   ------------------------------------------------------------------------------------------------------------------  RADIOLOGY:  Dg Chest 2 View  Result Date: 03/28/2017 CLINICAL DATA:  72 year old with central chest heaviness. EXAM: CHEST  2 VIEW COMPARISON:  04/02/2015 FINDINGS: Stable position of the left dual chamber cardiac pacemaker. Heart size is normal. Lungs are clear without focal airspace disease or pulmonary edema. Surgical anchors in the right humeral head. No pleural effusions. IMPRESSION: No active cardiopulmonary disease. Electronically Signed   By: Richarda Overlie M.D.   On: 03/28/2017 15:00   Ct Angio Chest Aorta W And/or Wo Contrast  Result Date: 03/28/2017 CLINICAL DATA:  Chest pain and shortness of breath EXAM: CT ANGIOGRAPHY CHEST WITH CONTRAST TECHNIQUE: Multidetector CT imaging of the chest was performed using the standard protocol during bolus administration of intravenous contrast. Multiplanar CT image reconstructions and MIPs were obtained to evaluate the vascular anatomy. CONTRAST:  75mL  ISOVUE-370 IOPAMIDOL (  ISOVUE-370) INJECTION 76% COMPARISON:  Chest radiograph 03/28/2017 Chest CT 11/16/2015 FINDINGS: Cardiovascular: There is no central pulmonary embolus. The main pulmonary artery is within normal limits for size. There is a normal 3-vessel arch branching pattern without evidence of acute aortic syndrome. There is mild aortic atherosclerosis. Heart size is normal, without pericardial effusion. Pacemaker leads in the right atrium and right ventricle. Mediastinum/Nodes: No mediastinal, hilar or axillary lymphadenopathy. The visualized thyroid and thoracic esophageal course are unremarkable. Lungs/Pleura: No pulmonary nodules or masses. No pleural effusion or pneumothorax. No focal airspace consolidation. No focal pleural abnormality. Upper Abdomen: Contrast bolus timing is not optimized for evaluation of the abdominal organs. Within this limitation, the visualized organs of the upper abdomen are normal. Musculoskeletal: No chest wall abnormality. No acute or significant osseous findings. Review of the MIP images confirms the above findings. IMPRESSION: 1. No acute aortic syndrome, central pulmonary embolus or other acute thoracic abnormality. 2.  Aortic Atherosclerosis (ICD10-I70.0). Electronically Signed   By: Deatra Robinson M.D.   On: 03/28/2017 17:04    ASSESSMENT AND PLAN:   Principal Problem:   Angina pectoris (HCC) Active Problems:   COPD (chronic obstructive pulmonary disease) (HCC)   CAD (coronary artery disease)   HLD (hyperlipidemia)   Hypertension   Diabetes (HCC)   Sick sinus syndrome (HCC)  * Angina   Troponin is negative, but due to recurrent pain- cardio plans for cath tomorrow.  * CAD   Cont home meds  * COPD   No exacerbation, cont nebs  * Htn   Stable  * DM   ISS   All the records are reviewed and case discussed with Care Management/Social Workerr. Management plans discussed with the patient, family and they are in agreement.  CODE STATUS:  full.  TOTAL TIME TAKING CARE OF THIS PATIENT: 35 minutes.     POSSIBLE D/C IN 1-2 DAYS, DEPENDING ON CLINICAL CONDITION.   Altamese Dilling M.D on 03/29/2017   Between 7am to 6pm - Pager - [phone]  After 6pm go to [email or web address] - password [email or web address]  [facility]  Office  [phone]  CC: Primary care physician; Marletta Lor, NP  Note: This dictation was prepared with Dragon dictation along with smaller phrase technology. Any transcriptional errors that result from this process are unintentional.

## 2017-03-30 ENCOUNTER — Observation Stay: Payer: Medicare Other

## 2017-03-30 DIAGNOSIS — I25110 Atherosclerotic heart disease of native coronary artery with unstable angina pectoris: Secondary | ICD-10-CM | POA: Diagnosis not present

## 2017-03-30 DIAGNOSIS — I2510 Atherosclerotic heart disease of native coronary artery without angina pectoris: Secondary | ICD-10-CM | POA: Diagnosis not present

## 2017-03-30 DIAGNOSIS — R079 Chest pain, unspecified: Secondary | ICD-10-CM | POA: Diagnosis not present

## 2017-03-30 DIAGNOSIS — J449 Chronic obstructive pulmonary disease, unspecified: Secondary | ICD-10-CM | POA: Diagnosis not present

## 2017-03-30 DIAGNOSIS — I209 Angina pectoris, unspecified: Secondary | ICD-10-CM | POA: Diagnosis not present

## 2017-03-30 DIAGNOSIS — I10 Essential (primary) hypertension: Secondary | ICD-10-CM | POA: Diagnosis not present

## 2017-03-30 LAB — BASIC METABOLIC PANEL
ANION GAP: 8 (ref 5–15)
BUN: 12 mg/dL (ref 6–20)
CHLORIDE: 107 mmol/L (ref 101–111)
CO2: 24 mmol/L (ref 22–32)
Calcium: 9.2 mg/dL (ref 8.9–10.3)
Creatinine, Ser: 0.8 mg/dL (ref 0.44–1.00)
GFR calc Af Amer: >60 mL/min (ref >60)
GFR calc non Af Amer: >60 mL/min (ref >60)
Glucose, Bld: 142 mg/dL — ABNORMAL HIGH (ref 65–99)
POTASSIUM: 4.1 mmol/L (ref 3.5–5.1)
SODIUM: 139 mmol/L (ref 135–145)

## 2017-03-30 LAB — GLUCOSE, CAPILLARY
GLUCOSE-CAPILLARY: 150 mg/dL — AB (ref 65–99)
GLUCOSE-CAPILLARY: 164 mg/dL — AB (ref 65–99)
Glucose-Capillary: 157 mg/dL — ABNORMAL HIGH (ref 65–99)

## 2017-03-30 LAB — CBC
HCT: 36.5 % (ref 35.0–47.0)
HEMOGLOBIN: 12.5 g/dL (ref 12.0–16.0)
MCH: 30.9 pg (ref 26.0–34.0)
MCHC: 34.4 g/dL (ref 32.0–36.0)
MCV: 90 fL (ref 80.0–100.0)
Platelets: 256 10*3/uL (ref 150–440)
RBC: 4.05 MIL/uL (ref 3.80–5.20)
RDW: 14.5 % (ref 11.5–14.5)
WBC: 7.8 10*3/uL (ref 3.6–11.0)

## 2017-03-30 MED ORDER — LOSARTAN POTASSIUM 50 MG PO TABS
100.0000 mg | ORAL_TABLET | Freq: Every day | ORAL | Status: DC
  Administered 2017-03-30: 100 mg via ORAL
  Filled 2017-03-30 (×2): qty 2

## 2017-03-30 MED ORDER — AMLODIPINE BESYLATE 10 MG PO TABS
10.0000 mg | ORAL_TABLET | Freq: Every day | ORAL | Status: DC
  Administered 2017-03-30: 10 mg via ORAL
  Filled 2017-03-30 (×2): qty 1

## 2017-03-30 MED ORDER — TECHNETIUM TC 99M TETROFOSMIN IV KIT
30.0000 | PACK | Freq: Once | INTRAVENOUS | Status: AC | PRN
  Administered 2017-03-30: 27.86 via INTRAVENOUS

## 2017-03-30 MED ORDER — TECHNETIUM TC 99M TETROFOSMIN IV KIT
13.3200 | PACK | Freq: Once | INTRAVENOUS | Status: AC | PRN
  Administered 2017-03-30: 13.32 via INTRAVENOUS

## 2017-03-30 MED ORDER — REGADENOSON 0.4 MG/5ML IV SOLN
0.4000 mg | Freq: Once | INTRAVENOUS | Status: AC
  Administered 2017-03-30: 0.4 mg via INTRAVENOUS

## 2017-03-30 NOTE — Plan of Care (Signed)
  Adequate for Discharge Education: Knowledge of General Education information will improve 03/30/2017 1801 - Adequate for Discharge by Erma Heritage, RN Health Behavior/Discharge Planning: Ability to manage health-related needs will improve 03/30/2017 1801 - Adequate for Discharge by Erma Heritage, RN Clinical Measurements: Ability to maintain clinical measurements within normal limits will improve 03/30/2017 1801 - Adequate for Discharge by Weldon Picking, Manuella Ghazi, RN Will remain free from infection 03/30/2017 1801 - Adequate for Discharge by Erma Heritage, RN Diagnostic test results will improve 03/30/2017 1801 - Adequate for Discharge by Erma Heritage, RN Respiratory complications will improve 03/30/2017 1801 - Adequate for Discharge by Erma Heritage, RN Cardiovascular complication will be avoided 03/30/2017 1801 - Adequate for Discharge by Weldon Picking, Manuella Ghazi, RN Activity: Risk for activity intolerance will decrease 03/30/2017 1801 - Adequate for Discharge by Erma Heritage, RN Nutrition: Adequate nutrition will be maintained 03/30/2017 1801 - Adequate for Discharge by Erma Heritage, RN Coping: Level of anxiety will decrease 03/30/2017 1801 - Adequate for Discharge by Weldon Picking, Manuella Ghazi, RN Elimination: Will not experience complications related to bowel motility 03/30/2017 1801 - Adequate for Discharge by Weldon Picking, Manuella Ghazi, RN Will not experience complications related to urinary retention 03/30/2017 1801 - Adequate for Discharge by Erma Heritage, RN Pain Managment: General experience of comfort will improve 03/30/2017 1801 - Adequate for Discharge by Erma Heritage, RN Safety: Ability to remain free from injury will improve 03/30/2017 1801 - Adequate for Discharge by Erma Heritage, RN Skin Integrity: Risk for impaired skin integrity will decrease 03/30/2017 1801  - Adequate for Discharge by Erma Heritage, RN Education: Understanding of cardiac disease, CV risk reduction, and recovery process will improve 03/30/2017 1801 - Adequate for Discharge by Erma Heritage, RN Cardiac: Ability to achieve and maintain adequate cardiovascular perfusion will improve 03/30/2017 1801 - Adequate for Discharge by Erma Heritage, RN Health Behavior/Discharge Planning: Ability to safely manage health-related needs after discharge will improve 03/30/2017 1801 - Adequate for Discharge by Erma Heritage, RN

## 2017-03-30 NOTE — Progress Notes (Signed)
Paged cardio MD to verify stress test this morning. Patient had been NPO. MD verified, called Nuclear Medicine and they verified as well. Patient will go down at 10am. Will notify patient and continue to monitor.

## 2017-03-30 NOTE — Progress Notes (Addendum)
Patient given discharge instructions with family at bedside. IV taken out and tele monitor taken off. Flu and pneumonia vaccine given. Patient verbalized understanding with appropriate questions. All questions answered. Patient transported via wheelchair.

## 2017-03-30 NOTE — Discharge Summary (Signed)
[facility] - [place] at [facility]   PATIENT NAME: Melanie King    MR#:  [id number]  DATE OF BIRTH:  12-22-1945  DATE OF ADMISSION:  03/28/2017 ADMITTING PHYSICIAN: Oralia Manis, MD  DATE OF DISCHARGE: 03/30/2017  PRIMARY CARE PHYSICIAN: Marletta Lor, NP    ADMISSION DIAGNOSIS:  Chest pain with moderate risk for cardiac etiology [R07.9]  DISCHARGE DIAGNOSIS:  Principal Problem:   Angina pectoris (HCC) Active Problems:   COPD (chronic obstructive pulmonary disease) (HCC)   CAD (coronary artery disease)   HLD (hyperlipidemia)   Hypertension   Diabetes (HCC)   Sick sinus syndrome (HCC)   SECONDARY DIAGNOSIS:   Past Medical History:  Diagnosis Date  . Arthritis   . CHF (congestive heart failure) (HCC)   . DDD (degenerative disc disease), cervical   . DDD (degenerative disc disease), lumbar   . Depression   . Diabetes mellitus without complication (HCC)   . GERD (gastroesophageal reflux disease)   . Hyperlipidemia   . Hypertension   . Osteoarthritis   . Pacemaker     HOSPITAL COURSE:   * Angina   Troponin is negative, but due to recurrent pain-    Cardio dated nuclear stress test which was negative so suggested discharge.  * CAD   Cont home meds  * COPD   No exacerbation, cont nebs  * Htn   Stable  * DM   ISS     DISCHARGE CONDITIONS:   Stable  CONSULTS OBTAINED:  Treatment Team:  Alwyn Pea, MD  DRUG ALLERGIES:   Allergies  Allergen Reactions  . Alprazolam Swelling  . Demerol [Meperidine] Itching  . Fentanyl Other (See Comments)    "burning and hot"    DISCHARGE MEDICATIONS:   Allergies as of 03/30/2017      Reactions   Alprazolam Swelling   Demerol [meperidine] Itching   Fentanyl Other (See Comments)   "burning and hot"      Medication List    TAKE these medications   albuterol 108 (90 Base) MCG/ACT inhaler Commonly known as:  PROVENTIL HFA;VENTOLIN HFA Inhale 2 puffs into the lungs  every 6 (six) hours as needed for wheezing or shortness of breath.   amLODipine 10 MG tablet Commonly known as:  NORVASC TAKE 1 TABLET BY MOUTH EVERY DAY   aspirin 81 MG EC tablet TAKE 1 TABLET (81 MG TOTAL) BY MOUTH DAILY.   benztropine 1 MG tablet Commonly known as:  COGENTIN Take 1 mg by mouth 2 (two) times daily.   CERTAVITE SENIOR/ANTIOXIDANT Tabs TAKE 1 TABLET BY MOUTH EVERY OTHER DAY   citalopram 20 MG tablet Commonly known as:  CELEXA Take 20 mg by mouth daily.   docusate sodium 100 MG capsule Commonly known as:  COLACE TAKE 1 CAPSULE BY MOUTH EVERY DAY   etodolac 200 MG capsule Commonly known as:  LODINE Take 1 capsule (200 mg total) by mouth every 8 (eight) hours.   fluticasone 50 MCG/ACT nasal spray Commonly known as:  FLONASE Place 1 spray into both nostrils daily.   furosemide 20 MG tablet Commonly known as:  LASIX TAKE 1 TABLET BY MOUTH EVERY DAY FOR FLUID   gabapentin 300 MG capsule Commonly known as:  NEURONTIN Take 300 mg by mouth 3 (three) times daily.   LANTUS SOLOSTAR 100 UNIT/ML Solostar Pen Generic drug:  Insulin Glargine Inject 20 Units into the skin daily. What changed:  how much to take   levothyroxine 125 MCG tablet Commonly known  as:  SYNTHROID, LEVOTHROID TAKE 1 TABLET BY MOUTH EVERY DAY   losartan 100 MG tablet Commonly known as:  COZAAR Take 1 tablet (100 mg total) by mouth daily.   metFORMIN 500 MG 24 hr tablet Commonly known as:  GLUCOPHAGE-XR Take 1,000 mg by mouth 2 (two) times daily. What changed:  Another medication with the same name was removed. Continue taking this medication, and follow the directions you see here.   metoprolol tartrate 25 MG tablet Commonly known as:  LOPRESSOR Take 25 mg by mouth 2 (two) times daily. What changed:  Another medication with the same name was removed. Continue taking this medication, and follow the directions you see here.   montelukast 10 MG tablet Commonly known as:   SINGULAIR Take 1 tablet (10 mg total) by mouth daily.   nitroGLYCERIN 0.4 MG SL tablet Commonly known as:  NITROSTAT Place 0.4 mg under the tongue every 5 (five) minutes as needed for chest pain.   nitroGLYCERIN 0.2 mg/hr patch Commonly known as:  NITRODUR - Dosed in mg/24 hr APPLY 1 PATCH ONTO THE SKIN ONCE DAILY   ranitidine 150 MG tablet Commonly known as:  ZANTAC TAKE 1 TABLET (150 MG TOTAL) BY MOUTH 2 (TWO) TIMES DAILY.   REXULTI 1 MG Tabs Generic drug:  Brexpiprazole Take 1 mg by mouth daily.   rosuvastatin 20 MG tablet Commonly known as:  CRESTOR Take 1 tablet (20 mg total) by mouth at bedtime.   traMADol 50 MG tablet Commonly known as:  ULTRAM Take 1 tablet (50 mg total) by mouth every 8 (eight) hours as needed.   ULTICARE SHORT PEN NEEDLES 31G X 8 MM Misc Generic drug:  Insulin Pen Needle   Vitamin B-12 2500 MCG Subl Place 2,500 mcg under the tongue every other day.   Vitamin D (Ergocalciferol) 50000 units Caps capsule Commonly known as:  DRISDOL TAKE ONE CAPSULE BY MOUTH MONTHLY        DISCHARGE INSTRUCTIONS:    Follow with PMD in 1-2 weeks.  If you experience worsening of your admission symptoms, develop shortness of breath, life threatening emergency, suicidal or homicidal thoughts you must seek medical attention immediately by calling 911 or calling your MD immediately  if symptoms less severe.  You Must read complete instructions/literature along with all the possible adverse reactions/side effects for all the Medicines you take and that have been prescribed to you. Take any new Medicines after you have completely understood and accept all the possible adverse reactions/side effects.   Please note  You were cared for by a hospitalist during your hospital stay. If you have any questions about your discharge medications or the care you received while you were in the hospital after you are discharged, you can call the unit and asked to speak with the  hospitalist on call if the hospitalist that took care of you is not available. Once you are discharged, your primary care physician will handle any further medical issues. Please note that NO REFILLS for any discharge medications will be authorized once you are discharged, as it is imperative that you return to your primary care physician (or establish a relationship with a primary care physician if you do not have one) for your aftercare needs so that they can reassess your need for medications and monitor your lab values.    Today   CHIEF COMPLAINT:   Chief Complaint  Patient presents with  . Chest Pain    HISTORY OF PRESENT ILLNESS:  Melanie King  is a 72 y.o. female presents with chest discomfort that was felt to the central pressure with radiation to bilateral shoulders.  Patient states this happened earlier today when she was out and about.  She has a history of cardiac disease and sick sinus syndrome.  She states that this pain felt similar to prior cardiac chest pain she does experience though it was much worse.  Her pain eased off and resolved after she was resting here in the ED for some time.  Hospitalists were called for admission and further evaluation   VITAL SIGNS:  Blood pressure 135/65, pulse 70, temperature 98.2 F (36.8 C), temperature source Oral, resp. rate 18, height 5\' 1"  (1.549 m), weight 59.7 kg (131 lb 11.2 oz), SpO2 99 %.  I/O:    Intake/Output Summary (Last 24 hours) at 03/30/2017 1752 Last data filed at 03/30/2017 1751 Gross per 24 hour  Intake 123 ml  Output 1850 ml  Net -1727 ml    PHYSICAL EXAMINATION:  GENERAL:  72 y.o.-year-old patient lying in the bed with no acute distress.  EYES: Pupils equal, round, reactive to light and accommodation. No scleral icterus. Extraocular muscles intact.  HEENT: Head atraumatic, normocephalic. Oropharynx and nasopharynx clear.  NECK:  Supple, no jugular venous distention. No thyroid enlargement, no tenderness.   LUNGS: Normal breath sounds bilaterally, no wheezing, rales,rhonchi or crepitation. No use of accessory muscles of respiration.  CARDIOVASCULAR: S1, S2 normal. No murmurs, rubs, or gallops.  ABDOMEN: Soft, non-tender, non-distended. Bowel sounds present. No organomegaly or mass.  EXTREMITIES: No pedal edema, cyanosis, or clubbing.  NEUROLOGIC: Cranial nerves II through XII are intact. Muscle strength 5/5 in all extremities. Sensation intact. Gait not checked.  PSYCHIATRIC: The patient is alert and oriented x 3.  SKIN: No obvious rash, lesion, or ulcer.   DATA REVIEW:   CBC Recent Labs  Lab 03/30/17 0416  WBC 7.8  HGB 12.5  HCT 36.5  PLT 256    Chemistries  Recent Labs  Lab 03/30/17 0416  NA 139  K 4.1  CL 107  CO2 24  GLUCOSE 142*  BUN 12  CREATININE 0.80  CALCIUM 9.2    Cardiac Enzymes Recent Labs  Lab 03/29/17 1000  TROPONINI <0.03    Microbiology Results  Results for orders placed or performed during the hospital encounter of 12/27/16  Blood culture (routine x 2)     Status: None   Collection Time: 12/27/16  6:41 AM  Result Value Ref Range Status   Specimen Description BLOOD LT HAND  Final   Special Requests   Final    BOTTLES DRAWN AEROBIC AND ANAEROBIC Blood Culture adequate volume   Culture NO GROWTH 5 DAYS  Final   Report Status 01/01/2017 FINAL  Final  Blood culture (routine x 2)     Status: None   Collection Time: 12/27/16  6:41 AM  Result Value Ref Range Status   Specimen Description BLOOD LT FOREARM  Final   Special Requests   Final    BOTTLES DRAWN AEROBIC AND ANAEROBIC Blood Culture adequate volume   Culture NO GROWTH 5 DAYS  Final   Report Status 01/01/2017 FINAL  Final    RADIOLOGY:  No results found.  EKG:   Orders placed or performed during the hospital encounter of 03/28/17  . ED EKG within 10 minutes  . ED EKG within 10 minutes  . EKG 12-Lead  . EKG 12-Lead      Management plans discussed with the patient, family and  they  are in agreement.  CODE STATUS:     Code Status Orders  (From admission, onward)        Start     Ordered   03/28/17 2203  Full code  Continuous     03/28/17 2202    Code Status History    Date Active Date Inactive Code Status Order ID Comments User Context   This patient has a current code status but no historical code status.      TOTAL TIME TAKING CARE OF THIS PATIENT: 35 minutes.    Altamese Dilling M.D on 03/30/2017 at 5:52 PM  Between 7am to 6pm - Pager - [phone]  After 6pm go to [email or web address] - password [email or web address]  [facility]  Office  [phone]  CC: Primary care physician; Marletta Lor, NP   Note: This dictation was prepared with Dragon dictation along with smaller phrase technology. Any transcriptional errors that result from this process are unintentional.

## 2017-03-30 NOTE — Progress Notes (Signed)
Patient ambulated around the nurses station two times with a walker on room air. Patient had no complaints, no shortness of breath or dizziness. Will continue to monitor.

## 2017-04-02 ENCOUNTER — Ambulatory Visit
Admission: RE | Admit: 2017-04-02 | Discharge: 2017-04-02 | Disposition: A | Discharge status: Home / Self Care | Payer: Medicare Other | Source: Ambulatory Visit | Attending: Nurse Practitioner | Admitting: Nurse Practitioner

## 2017-04-02 ENCOUNTER — Ambulatory Visit: Payer: Medicare Other | Attending: Nurse Practitioner | Admitting: Nurse Practitioner

## 2017-04-02 ENCOUNTER — Other Ambulatory Visit: Payer: Self-pay

## 2017-04-02 ENCOUNTER — Encounter: Payer: Self-pay | Admitting: Nurse Practitioner

## 2017-04-02 VITALS — BP 146/63 | HR 69 | Temp 97.8°F | Resp 16 | Ht 61.0 in | Wt 135.0 lb

## 2017-04-02 DIAGNOSIS — Z789 Other specified health status: Secondary | ICD-10-CM

## 2017-04-02 DIAGNOSIS — G894 Chronic pain syndrome: Secondary | ICD-10-CM | POA: Diagnosis not present

## 2017-04-02 DIAGNOSIS — G8929 Other chronic pain: Secondary | ICD-10-CM | POA: Insufficient documentation

## 2017-04-02 DIAGNOSIS — M533 Sacrococcygeal disorders, not elsewhere classified: Secondary | ICD-10-CM

## 2017-04-02 DIAGNOSIS — I110 Hypertensive heart disease with heart failure: Secondary | ICD-10-CM | POA: Diagnosis not present

## 2017-04-02 DIAGNOSIS — Z79899 Other long term (current) drug therapy: Secondary | ICD-10-CM | POA: Diagnosis not present

## 2017-04-02 DIAGNOSIS — M25552 Pain in left hip: Secondary | ICD-10-CM | POA: Diagnosis not present

## 2017-04-02 DIAGNOSIS — M5442 Lumbago with sciatica, left side: Secondary | ICD-10-CM | POA: Diagnosis not present

## 2017-04-02 DIAGNOSIS — M5441 Lumbago with sciatica, right side: Secondary | ICD-10-CM | POA: Insufficient documentation

## 2017-04-02 DIAGNOSIS — E1140 Type 2 diabetes mellitus with diabetic neuropathy, unspecified: Secondary | ICD-10-CM | POA: Diagnosis not present

## 2017-04-02 DIAGNOSIS — R079 Chest pain, unspecified: Secondary | ICD-10-CM | POA: Insufficient documentation

## 2017-04-02 DIAGNOSIS — K219 Gastro-esophageal reflux disease without esophagitis: Secondary | ICD-10-CM | POA: Insufficient documentation

## 2017-04-02 DIAGNOSIS — M79605 Pain in left leg: Secondary | ICD-10-CM | POA: Diagnosis not present

## 2017-04-02 DIAGNOSIS — M79604 Pain in right leg: Secondary | ICD-10-CM | POA: Diagnosis not present

## 2017-04-02 DIAGNOSIS — M25512 Pain in left shoulder: Secondary | ICD-10-CM | POA: Diagnosis not present

## 2017-04-02 DIAGNOSIS — M47898 Other spondylosis, sacral and sacrococcygeal region: Secondary | ICD-10-CM | POA: Diagnosis not present

## 2017-04-02 DIAGNOSIS — I509 Heart failure, unspecified: Secondary | ICD-10-CM | POA: Insufficient documentation

## 2017-04-02 DIAGNOSIS — M899 Disorder of bone, unspecified: Secondary | ICD-10-CM

## 2017-04-02 DIAGNOSIS — M25551 Pain in right hip: Secondary | ICD-10-CM

## 2017-04-02 DIAGNOSIS — M25511 Pain in right shoulder: Secondary | ICD-10-CM | POA: Insufficient documentation

## 2017-04-02 DIAGNOSIS — Z79891 Long term (current) use of opiate analgesic: Secondary | ICD-10-CM | POA: Diagnosis not present

## 2017-04-02 DIAGNOSIS — I700 Atherosclerosis of aorta: Secondary | ICD-10-CM | POA: Insufficient documentation

## 2017-04-02 DIAGNOSIS — M5135 Other intervertebral disc degeneration, thoracolumbar region: Secondary | ICD-10-CM | POA: Diagnosis not present

## 2017-04-02 LAB — NM MYOCAR MULTI W/SPECT W/WALL MOTION / EF
CSEPHR: 60 %
Estimated workload: 1 METS
Exercise duration (min): 1 min
Exercise duration (sec): 0 s
LVDIAVOL: 56 mL (ref 46–106)
LVSYSVOL: 27 mL
MPHR: 149 {beats}/min
Peak HR: 90 {beats}/min
Rest HR: 74 {beats}/min
SDS: 0
SRS: 2
SSS: 0
TID: 1.2

## 2017-04-02 NOTE — Progress Notes (Signed)
Patient's Name: Melanie King  MRN: [id number]  Referring Provider: Alvester Chou, NP  DOB: June 26, 1945  PCP: Alvester Chou, NP  DOS: 04/02/2017  Note by: Dionisio David NP  Service setting: Ambulatory outpatient  Specialty: Interventional Pain Management  Location: [facility] (AMB) Pain Management Facility    Patient type: New Patient    Primary Reason(s) for Visit: Initial Patient Evaluation CC: Hip Pain (bilaterally) and Shoulder Pain (left)  HPI  Melanie King is a 72 y.o. year old, female patient, who comes today for an initial evaluation. She has Personal history of tobacco use, presenting hazards to health; Angina pectoris (HCC); COPD (chronic obstructive pulmonary disease) ([place]); CAD (coronary artery disease); HLD (hyperlipidemia); Hypertension; Diabetes ([place]); Sick sinus syndrome ([place]); Chronic pain; Trigger finger of left hand; Osteoarthritis of both hands; Tremor; Hip pain, bilateral; Chest pain, unspecified; Bilateral lower extremity pain (Secondary Area of Pain) (R>L); Chronic hip pain, bilateral  (Secondary Area of Pain) (R>L); Chronic bilateral low back pain with bilateral sciatica [facility] Area of Pain) (R>L); Chronic pain syndrome; Disorder of bone, unspecified; Other long term (current) drug therapy; Other specified health status; and Long term current use of opiate analgesic on their problem list.. Her primarily concern today is the Hip Pain (bilaterally) and Shoulder Pain (left)  Pain Assessment: Location: Right, Left Hip Radiating: readiates down the backs of both legs to the knees Onset: More than a month ago Duration: Chronic pain Quality: Constant, Aching Severity: 6 /10 (self-reported pain score)  Note: Reported level is compatible with observation. Clinically the patient looks like a 3/10 A 3/10 is viewed as "Moderate" and described as significantly interfering with activities of daily living (ADL). It becomes difficult to feed, bathe, get dressed, get on and off the toilet or to  perform personal hygiene functions. Difficult to get in and out of bed or a chair without assistance. Very distracting. With effort, it can be ignored when deeply involved in activities. Information on the proper use of the pain scale provided to the patient today. When using our objective Pain Scale, levels between 6 and 10/10 are said to belong in an emergency room, as it progressively worsens from a 6/10, described as severely limiting, requiring emergency care not usually available at an outpatient pain management facility. At a 6/10 level, communication becomes difficult and requires great effort. Assistance to reach the emergency department may be required. Facial flushing and profuse sweating along with potentially dangerous increases in heart rate and blood pressure will be evident. Effect on ADL: can only stand up for 5 minutes before pt needs to sit down Timing: Constant Modifying factors: ibuprofen, walking  Onset and Duration: Present longer than 3 months Cause of pain: Unknown Severity: Getting worse, NAS-11 at its worse: 9/10, NAS-11 at its best: 4/10, NAS-11 now: 6/10 and NAS-11 on the average: 7/10 Timing: After activity or exercise Aggravating Factors: Bending, Climbing, Prolonged sitting, Prolonged standing, Squatting, Stooping , Twisting, Walking uphill and Working Alleviating Factors: Hot packs, Sitting and Standing Associated Problems: Constipation, Depression, Dizziness, Fatigue, Inability to concentrate, Inability to control bladder (urine), Inability to control bowel, Nausea, Sweating, Swelling, Temperature changes, Tingling, Weakness and Pain that wakes patient up Quality of Pain: Aching, Agonizing, Annoying, Constant, Cramping, Disabling, Exhausting, Fearful, Feeling of weight, Horrible, Nagging, Pressure-like, Sharp, Shooting, Tender, Throbbing, Tiring and Uncomfortable Previous Examinations or Tests: Bone scan, CT scan, Ct-Myelogram, Endoscopy, MRI scan, Nerve block, X-rays,  Neurological evaluation, Orthopedic evaluation and Psychiatric evaluation Previous Treatments: Epidural steroid injections, Narcotic medications and Trigger  point injections  The patient comes into the clinics today for the first time for a chronic pain management evaluation. According to the patient her primary area pain is in her hips.  She denies any previous surgeries. She has had hip injections which were not effective. She denies any recent physical therapy. She states that she has had recent images.  Her second area of pain is in her lower extremities. She admits that the pain does radiate down into her knees. He has numbness, tingling and weakness. She does have occasional pain that radiates down to her great toe.  Her third area of pain is in her lower back. She admits that the right side is greater than the left. She denies any previous surgeries. She did have removal of the cyst approximately 60 years ago. She admits that she has had interventional therapy performed by Dr. Lowella Dandy which were effective. She denies any recent physical therapy or recent images.  Today I took the time to provide the patient with information regarding this pain practice. The patient was informed that the practice is divided into two sections: an interventional pain management section, as well as a completely separate and distinct medication management section. I explained that there are procedure days for interventional therapies, and evaluation days for follow-ups and medication management. Because of the amount of documentation required during both, they are kept separated. This means that there is the possibility that she may be scheduled for a procedure on one day, and medication management the next. I have also informed her that because of staffing and facility limitations, this practice will no longer take patients for medication management only. To illustrate the reasons for this, I gave the patient the example  of surgeons, and how inappropriate it would be to refer a patient to his/her care, just to write for the post-surgical antibiotics on a surgery done by a different surgeon.   Because interventional pain management is part of the board-certified specialty for the doctors, the patient was informed that joining this practice means that they are open to any and all interventional therapies. I made it clear that this does not mean that they will be forced to have any procedures done. What this means is that I believe interventional therapies to be essential part of the diagnosis and proper management of chronic pain conditions. Therefore, patients not interested in these interventional alternatives will be better served under the care of a different practitioner.  The patient was also made aware of my Comprehensive Pain Management Safety Guidelines where by joining this practice, they limit all of their nerve blocks and joint injections to those done by our practice, for as long as we are retained to manage their care. Historic Controlled Substance Pharmacotherapy Review  PMP and historical list of controlled substances: Tramadol 50 mg, acetaminophen with codeine No. 3, hydrocodone/acetaminophen 5/325 mg, oxycodone 5 mg, lorazepam 0.5 mg, hydrocodone/acetaminophen 5/300 mg, hydrocodone/acetaminophen 5/500 mg, hydrocodone/acetaminophen 10/650 mg, Highest opioid analgesic regimen found: Oxycodone 5 mg 1 tablet 5 times daily ( fill date 02/09/2014) oxycodone 25 mg per day Most recent opioid analgesic: Tramadol 50 mg 3 times daily (fill date 02/09/2017) ( tramadol 150 mg per day) Current opioid analgesics: Tramadol 50 mg 3 times daily (fill date 02/09/2017) ( tramadol 150 mg per day) Highest recorded MME/day: 37.5 mg/day MME/day: 15 mg/day Medications: The patient did not bring the medication(s) to the appointment, as requested in our "New Patient Package" Pharmacodynamics: Desired effects: Analgesia: The  patient reports >  50% benefit. Reported improvement in function: The patient reports medication allows her to accomplish basic ADLs. Clinically meaningful improvement in function (CMIF): Sustained CMIF goals met Perceived effectiveness: Described as relatively effective, allowing for increase in activities of daily living (ADL) Undesirable effects: Side-effects or Adverse reactions: None reported Historical Monitoring: The patient  reports that she does not use drugs. List of all UDS Test(s): No results found for: MDMA, COCAINSCRNUR, PCPSCRNUR, PCPQUANT, CANNABQUANT, THCU, [place] List of all Serum Drug Screening Test(s):  No results found for: AMPHSCRSER, BARBSCRSER, BENZOSCRSER, COCAINSCRSER, PCPSCRSER, PCPQUANT, THCSCRSER, CANNABQUANT, OPIATESCRSER, OXYSCRSER, PROPOXSCRSER Historical Background Evaluation: [place] PDMP: Six (6) year initial data search conducted.             [place] Department of public safety, offender search: Editor, commissioning Information) Non-contributory Risk Assessment Profile: Aberrant behavior: None observed or detected today Risk factors for fatal opioid overdose: None identified today Fatal overdose hazard ratio (HR): Calculation deferred Non-fatal overdose hazard ratio (HR): Calculation deferred Risk of opioid abuse or dependence: 0.7-3.0% with doses ? 36 MME/day and 6.1-26% with doses ? 120 MME/day. Substance use disorder (SUD) risk level: Pending results of Medical Psychology Evaluation for SUD Opioid risk tool (ORT) (Total Score): 4  ORT Scoring interpretation table:  Score <3 = Low Risk for SUD  Score between 4-7 = Moderate Risk for SUD  Score >8 = High Risk for Opioid Abuse   PHQ-2 Depression Scale:  Total score: 0  PHQ-2 Scoring interpretation table: (Score and probability of major depressive disorder)  Score 0 = No depression  Score 1 = 15.4% Probability  Score 2 = 21.1% Probability  Score 3 = 38.4% Probability  Score 4 = 45.5% Probability  Score 5 = 56.4% Probability   Score 6 = 78.6% Probability   PHQ-9 Depression Scale:  Total score: 0  PHQ-9 Scoring interpretation table:  Score 0-4 = No depression  Score 5-9 = Mild depression  Score 10-14 = Moderate depression  Score 15-19 = Moderately severe depression  Score 20-27 = Severe depression (2.4 times higher risk of SUD and 2.89 times higher risk of overuse)   Pharmacologic Plan: Pending ordered tests and/or consults  Meds  The patient has a current medication list which includes the following prescription(s): albuterol, amlodipine, aspirin, benztropine, citalopram, docusate sodium, fluticasone, furosemide, lantus solostar, levothyroxine, losartan, metformin, metoprolol tartrate, montelukast, nitroglycerin, nitroglycerin, ranitidine, rexulti, rosuvastatin, tramadol, ulticare short pen needles, vitamin d (ergocalciferol), and gabapentin.  Current Outpatient Medications on File Prior to Visit  Medication Sig  . albuterol (PROVENTIL HFA;VENTOLIN HFA) 108 (90 Base) MCG/ACT inhaler Inhale 2 puffs into the lungs every 6 (six) hours as needed for wheezing or shortness of breath.  Marland Kitchen amLODipine (NORVASC) 10 MG tablet TAKE 1 TABLET BY MOUTH EVERY DAY  . aspirin 81 MG EC tablet TAKE 1 TABLET (81 MG TOTAL) BY MOUTH DAILY.  . benztropine (COGENTIN) 1 MG tablet Take 1 mg by mouth 2 (two) times daily.  . citalopram (CELEXA) 20 MG tablet Take 20 mg by mouth daily.   Marland Kitchen docusate sodium (COLACE) 100 MG capsule TAKE 1 CAPSULE BY MOUTH EVERY DAY  . fluticasone (FLONASE) 50 MCG/ACT nasal spray Place 1 spray into both nostrils daily.  . furosemide (LASIX) 20 MG tablet TAKE 1 TABLET BY MOUTH EVERY DAY FOR FLUID  . LANTUS SOLOSTAR 100 UNIT/ML Solostar Pen Inject 20 Units into the skin daily. (Patient taking differently: Inject 22 Units into the skin daily. )  . levothyroxine (SYNTHROID, LEVOTHROID) 125 MCG tablet TAKE 1 TABLET  BY MOUTH EVERY DAY  . losartan (COZAAR) 100 MG tablet Take 1 tablet (100 mg total) by mouth daily.  .  metFORMIN (GLUCOPHAGE-XR) 500 MG 24 hr tablet Take 1,000 mg by mouth 2 (two) times daily.  . metoprolol tartrate (LOPRESSOR) 25 MG tablet Take 25 mg by mouth 2 (two) times daily.  . montelukast (SINGULAIR) 10 MG tablet Take 1 tablet (10 mg total) by mouth daily.  . nitroGLYCERIN (NITRODUR - DOSED IN MG/24 HR) 0.2 mg/hr patch APPLY 1 PATCH ONTO THE SKIN ONCE DAILY  . nitroGLYCERIN (NITROSTAT) 0.4 MG SL tablet Place 0.4 mg under the tongue every 5 (five) minutes as needed for chest pain.  . ranitidine (ZANTAC) 150 MG tablet TAKE 1 TABLET (150 MG TOTAL) BY MOUTH 2 (TWO) TIMES DAILY.  Marland Kitchen REXULTI 1 MG TABS Take 1 mg by mouth daily.  . rosuvastatin (CRESTOR) 20 MG tablet Take 1 tablet (20 mg total) by mouth at bedtime.  . traMADol (ULTRAM) 50 MG tablet Take 1 tablet (50 mg total) by mouth every 8 (eight) hours as needed.  Marland Kitchen ULTICARE SHORT PEN NEEDLES 31G X 8 MM MISC   . Vitamin D, Ergocalciferol, (DRISDOL) 50000 units CAPS capsule TAKE ONE CAPSULE BY MOUTH MONTHLY  . gabapentin (NEURONTIN) 300 MG capsule Take 300 mg by mouth 3 (three) times daily.    No current facility-administered medications on file prior to visit.    Imaging Review  Hip Imaging:  Hip-R DG 2-3 views:  Results for orders placed during the hospital encounter of 10/13/14  DG HIP UNILAT WITH PELVIS 2-3 VIEWS RIGHT   Narrative CLINICAL DATA:  History of fall.  Initial evaluation .  EXAM: DG HIP (WITH OR WITHOUT PELVIS) 2-3V RIGHT  COMPARISON:  None.  FINDINGS: Degenerative changes lumbar spine and both hips. No acute bony abnormality identified. Aortoiliac atherosclerotic vascular calcification.  IMPRESSION: 1. Degenerative changes lumbar spine and both hips. No acute abnormality.  2.  Aortoiliac atherosclerotic vascular disease.   Electronically Signed   By: Marcello Moores  Register   On: 10/13/2014 13:55      Note: Available results from prior imaging studies were reviewed.        ROS  Cardiovascular History: Heart  trouble, Daily Aspirin intake, High blood pressure, Chest pain, Heart surgery, Pacemaker or defibrillator, Weak heart (CHF), Heart murmur, Heart catheterization and Blood thinners:  Anticoagulant Pulmonary or Respiratory History: Lung problems, Wheezing and difficulty taking a deep full breath (Asthma), Shortness of breath, Smoking, Snoring  and Coughing up mucus (Bronchitis) Neurological History: Abnormal skin sensations (Peripheral Neuropathy) and Incontinence:  Urinary and Fecal Review of Past Neurological Studies: No results found for this or any previous visit. Psychological-Psychiatric History: Psychiatric disorder, Depressed and Attempted suicide Gastrointestinal History: Irregular, infrequent bowel movements (Constipation) Hiatal hernia,irritable bowel syndrome Genitourinary History: Passing kidney stones Hematological History: Weakness due to low blood hemoglobin or red blood cell count (Anemia) and Brusing easily Endocrine History: High blood sugar requiring insulin (IDDM) and thyroid disease Rheumatologic History: Joint aches and or swelling due to excess weight (Osteoarthritis), Generalized muscle aches (Fibromyalgia) and Constant unexplained fatigue (Chronic Fatigue Syndrome) Musculoskeletal History: Negative for myasthenia gravis, muscular dystrophy, multiple sclerosis or malignant hyperthermia Work History: Disabled  Allergies  Ms. Perno is allergic to alprazolam; demerol [meperidine]; and fentanyl.  Laboratory Chemistry  Inflammation Markers Lab Results  Component Value Date   ESRSEDRATE 22 11/01/2011   (CRP: Acute Phase) (ESR: Chronic Phase) Renal Function Markers Lab Results  Component Value Date   BUN  12 03/30/2017   CREATININE 0.80 03/30/2017   GFRAA >60 03/30/2017   GFRNONAA >60 03/30/2017   Hepatic Function Markers Lab Results  Component Value Date   AST 24 05/31/2016   ALT 35 (H) 05/31/2016   ALBUMIN 4.5 05/31/2016   ALKPHOS 102 05/31/2016    Electrolytes Lab Results  Component Value Date   NA 139 03/30/2017   K 4.1 03/30/2017   CL 107 03/30/2017   CALCIUM 9.2 03/30/2017   MG 1.7 (L) 06/20/2013   Neuropathy Markers No results found for: [id number] Bone Pathology Markers Lab Results  Component Value Date   ALKPHOS 102 05/31/2016   CALCIUM 9.2 03/30/2017   Coagulation Parameters Lab Results  Component Value Date   PLT 256 03/30/2017   Cardiovascular Markers Lab Results  Component Value Date   HGB 12.5 03/30/2017   HCT 36.5 03/30/2017   Note: Lab results reviewed.  [place]  Drug: Ms. Godeaux  reports that she does not use drugs. Alcohol:  reports that she does not drink alcohol. Tobacco:  reports that she has been smoking cigarettes.  She has a 37.50 pack-year smoking history. she has never used smokeless tobacco. Medical:  has a past medical history of Arthritis, CHF (congestive heart failure) ([place]), Coronary arteriosclerosis, DDD (degenerative disc disease), cervical, DDD (degenerative disc disease), lumbar, Depression, Diabetes mellitus without complication ([place]), GERD (gastroesophageal reflux disease), Hyperlipidemia, Hypertension, Osteoarthritis, Pacemaker, and Tremor. Family: family history includes Aneurysm in her father; Breast cancer (age of onset: 13) in her maternal aunt; Cancer in her mother.  Past Surgical History:  Procedure Laterality Date  . ABDOMINAL HYSTERECTOMY    . BREAST BIOPSY Right 1994   neg cyst removed  . CHOLECYSTECTOMY    . EYE SURGERY  1964, 1966, and 1967   bilateral  . FOOT SURGERY Right    cellulitis  . PACEMAKER INSERTION    . SPINE SURGERY     cyst removed; [facility]   Active Ambulatory Problems    Diagnosis Date Noted  . Personal history of tobacco use, presenting hazards to health 11/16/2015  . Angina pectoris ([place]) 01/17/2016  . COPD (chronic obstructive pulmonary disease) ([place]) 01/17/2016  . CAD (coronary artery disease) 01/17/2016  . HLD (hyperlipidemia)  01/17/2016  . Hypertension 01/17/2016  . Diabetes ([place]) 05/12/2013  . Sick sinus syndrome ([place]) 01/17/2016  . Chronic pain 01/17/2016  . Trigger finger of left hand 02/16/2016  . Osteoarthritis of both hands 02/16/2016  . Tremor 05/27/2016  . Hip pain, bilateral 12/06/2016  . Chest pain, unspecified 04/02/2017  . Bilateral lower extremity pain (Secondary Area of Pain) (R>L) 04/02/2017  . Chronic hip pain, bilateral  (Secondary Area of Pain) (R>L) 04/02/2017  . Chronic bilateral low back pain with bilateral sciatica [facility] Area of Pain) (R>L) 04/02/2017  . Chronic pain syndrome 04/02/2017  . Disorder of bone, unspecified 04/02/2017  . Other long term (current) drug therapy 04/02/2017  . Other specified health status 04/02/2017  . Long term current use of opiate analgesic 04/02/2017   Resolved Ambulatory Problems    Diagnosis Date Noted  . No Resolved Ambulatory Problems   Past Medical History:  Diagnosis Date  . Arthritis   . CHF (congestive heart failure) ([place])   . Coronary arteriosclerosis   . DDD (degenerative disc disease), cervical   . DDD (degenerative disc disease), lumbar   . Depression   . Diabetes mellitus without complication ([place])   . GERD (gastroesophageal reflux disease)   . Hyperlipidemia   . Hypertension   .  Osteoarthritis   . Pacemaker   . Tremor    Constitutional Exam  General appearance: Well nourished, well developed, and well hydrated. In no apparent acute distress Vitals:   04/02/17 1357  BP: (!) 146/63  Pulse: 69  Resp: 16  Temp: 97.8 F (36.6 C)  TempSrc: Oral  SpO2: 100%  Weight: 135 lb (61.2 kg)  Height: '5\' 1"'  (1.549 m)   BMI Assessment: Estimated body mass index is 25.51 kg/m as calculated from the following:   Height as of this encounter: '5\' 1"'  (1.549 m).   Weight as of this encounter: 135 lb (61.2 kg).  BMI interpretation table: BMI level Category Range association with higher incidence of chronic pain  <18 kg/m2 Underweight    18.5-24.9 kg/m2 Ideal body weight   25-29.9 kg/m2 Overweight Increased incidence by 20%  30-34.9 kg/m2 Obese (Class I) Increased incidence by 68%  35-39.9 kg/m2 Severe obesity (Class II) Increased incidence by 136%  >40 kg/m2 Extreme obesity (Class III) Increased incidence by 254%   BMI Readings from Last 4 Encounters:  04/02/17 25.51 kg/m  03/30/17 24.88 kg/m  12/27/16 25.51 kg/m  05/31/16 25.15 kg/m   Wt Readings from Last 4 Encounters:  04/02/17 135 lb (61.2 kg)  03/30/17 131 lb 11.2 oz (59.7 kg)  12/27/16 135 lb (61.2 kg)  05/31/16 136 lb 9.6 oz (62 kg)  Psych/Mental status: Alert, oriented x 3 (person, place, & time)       Eyes: PERLA Respiratory: No evidence of acute respiratory distress  Cervical Spine Exam  Inspection: No masses, redness, or swelling Alignment: Symmetrical Functional ROM: Unrestricted ROM      Stability: No instability detected Muscle strength & Tone: Functionally intact Sensory: Unimpaired Palpation: No palpable anomalies              Upper Extremity (UE) Exam    Side: Right upper extremity  Side: Left upper extremity  Inspection: No masses, redness, swelling, or asymmetry. No contractures  Inspection: No masses, redness, swelling, or asymmetry. No contractures  Functional ROM: Unrestricted ROM          Functional ROM: Unrestricted ROM          Muscle strength & Tone: Functionally intact  Muscle strength & Tone: Functionally intact  Sensory: Unimpaired  Sensory: Unimpaired  Palpation: No palpable anomalies              Palpation: No palpable anomalies              Specialized Test(s): Deferred         Specialized Test(s): Deferred          Thoracic Spine Exam  Inspection: No masses, redness, or swelling Alignment: Symmetrical Functional ROM: Unrestricted ROM Stability: No instability detected Sensory: Unimpaired Muscle strength & Tone: No palpable anomalies  Lumbar Spine Exam  Inspection: No masses, redness, or swelling Alignment:  Symmetrical Functional ROM: Restricted ROM      Stability: No instability detected Muscle strength & Tone: Functionally intact Sensory: Unimpaired Palpation: Complains of area being tender to palpation       Provocative Tests: Lumbar Hyperextension and rotation test: Positive bilaterally for facet joint pain. Patrick's Maneuver: Positive for right-sided S-I arthralgia and for right hip arthralgia  Gait & Posture Assessment  Ambulation: Unassisted Gait: Relatively normal for age and body habitus Posture: WNL   Lower Extremity Exam    Side: Right lower extremity  Side: Left lower extremity  Inspection: No masses, redness, swelling, or asymmetry. No contractures  Inspection: No masses, redness, swelling, or asymmetry. No contractures  Functional ROM: Guarding for hip joint  Functional ROM: Unrestricted ROM          Muscle strength & Tone: Functionally intact  Muscle strength & Tone: Functionally intact  Sensory: Unimpaired  Sensory: Unimpaired  Palpation: No palpable anomalies  Palpation: No palpable anomalies   Assessment  Primary Diagnosis & Pertinent Problem List: The primary encounter diagnosis was Chronic sacroiliac joint pain. Diagnoses of Bilateral lower extremity pain (Secondary Area of Pain) (R>L), Chronic hip pain, bilateral  (Secondary Area of Pain) (R>L), Chronic bilateral low back pain with bilateral sciatica (Tertiary Area of Pain) (R>L), Chronic pain syndrome, Disorder of bone, unspecified, Other long term (current) drug therapy, Other specified health status, and Long term current use of opiate analgesic were also pertinent to this visit.  Visit Diagnosis: 1. Chronic sacroiliac joint pain   2. Bilateral lower extremity pain (Secondary Area of Pain) (R>L)   3. Chronic hip pain, bilateral  (Secondary Area of Pain) (R>L)   4. Chronic bilateral low back pain with bilateral sciatica [facility] Area of Pain) (R>L)   5. Chronic pain syndrome   6. Disorder of bone, unspecified    7. Other long term (current) drug therapy   8. Other specified health status   9. Long term current use of opiate analgesic    Plan of Care  Initial treatment plan:  Please be advised that as per protocol, today's visit has been an evaluation only. We have not taken over the patient's controlled substance management.  Problem-specific plan: No problem-specific Assessment & Plan notes found for this encounter.  Ordered Lab-work, Procedure(s), Referral(s), & Consult(s): Orders Placed This Encounter  Procedures  . DG Lumbar Spine Complete W/Bend  . DG Si Joints  . Compliance Drug Analysis, Ur  . Comp. Metabolic Panel (12)  . Magnesium  . Vitamin B12  . Sedimentation rate  . 25-Hydroxyvitamin D Lcms D2+D3  . C-reactive protein  . Ambulatory referral to Psychology  . Ambulatory referral to Physical Therapy   Pharmacotherapy: Medications ordered:  No orders of the defined types were placed in this encounter.  Medications administered during this visit: Melanie F. Ojala had no medications administered during this visit.   Pharmacotherapy under consideration:  Opioid Analgesics: The patient was informed that there is no guarantee that she would be a candidate for opioid analgesics. The decision will be made following CDC guidelines. This decision will be based on the results of diagnostic studies, as well as Ms. Tribbett's risk profile.  Membrane stabilizer: To be determined at a later time Muscle relaxant: To be determined at a later time NSAID: To be determined at a later time Other analgesic(s): To be determined at a later time   Interventional therapies under consideration: Ms. Bring was informed that there is no guarantee that she would be a candidate for interventional therapies. The decision will be based on the results of diagnostic studies, as well as Ms. Birkland's risk profile.  Possible procedure(s): Diagnostic bilateral intra-articular hip injection Diagnostic bilateral  sacroiliac joint injection Diagnostic bilateral LESI Diagnostic bilateral lumbar facet nerve block Possible bilateral lumbar facet RFA    Provider-requested follow-up: Return for 2nd Visit, w/ Dr. Dossie Arbour, after [facility] eval.  No future appointments.  Primary Care Physician: Alvester Chou, NP Location: [facility] Facility Note by:  Date: 04/02/2017; Time: 3:26 PM  Pain Score Disclaimer: We use the NRS-11 scale. This is a self-reported, subjective measurement of pain severity  with only modest accuracy. It is used primarily to identify changes within a particular patient. It must be understood that outpatient pain scales are significantly less accurate that those used for research, where they can be applied under ideal controlled circumstances with minimal exposure to variables. In reality, the score is likely to be a combination of pain intensity and pain affect, where pain affect describes the degree of emotional arousal or changes in action readiness caused by the sensory experience of pain. Factors such as social and work situation, setting, emotional state, anxiety levels, expectation, and prior pain experience may influence pain perception and show large inter-individual differences that may also be affected by time variables.  Patient instructions provided during this appointment: Patient Instructions    ____________________________________________________________________________________________  Appointment Policy Summary  It is our goal and responsibility to provide the medical community with assistance in the evaluation and management of patients with chronic pain. Unfortunately our resources are limited. Because we do not have an unlimited amount of time, or available appointments, we are required to closely monitor and manage their use. The following rules exist to maximize their use:  Patient's responsibilities: 1. Punctuality:  At what time should I arrive?  You should be physically present in our office 30 minutes before your scheduled appointment. Your scheduled appointment is with your assigned healthcare provider. However, it takes 5-10 minutes to be "checked-in", and another 15 minutes for the nurses to do the admission. If you arrive to our office at the time you were given for your appointment, you will end up being at least 20-25 minutes late to your appointment with the provider. 2. Tardiness:  What happens if I arrive only a few minutes after my scheduled appointment time? You will need to reschedule your appointment. The cutoff is your appointment time. This is why it is so important that you arrive at least 30 minutes before that appointment. If you have an appointment scheduled for 10:00 AM and you arrive at 10:01, you will be required to reschedule your appointment.  3. Plan ahead:  Always assume that you will encounter traffic on your way in. Plan for it. If you are dependent on a driver, make sure they understand these rules and the need to arrive early. 4. Other appointments and responsibilities:  Avoid scheduling any other appointments before or after your pain clinic appointments.  5. Be prepared:  Write down everything that you need to discuss with your healthcare provider and give this information to the admitting nurse. Write down the medications that you will need refilled. Bring your pills and bottles (even the empty ones), to all of your appointments, except for those where a procedure is scheduled. 6. No children or pets:  Find someone to take care of them. It is not appropriate to bring them in. 7. Scheduling changes:  We request "advanced notification" of any changes or cancellations. 8. Advanced notification:  Defined as a time period of more than 24 hours prior to the originally scheduled appointment. This allows for the appointment to be offered to other patients. 9. Rescheduling:  When a visit is rescheduled, it will  require the cancellation of the original appointment. For this reason they both fall within the category of "Cancellations".  10. Cancellations:  They require advanced notification. Any cancellation less than 24 hours before the  appointment will be recorded as a "No Show". 11. No Show:  Defined as an unkept appointment where the patient failed to notify or declare to the practice  their intention or inability to keep the appointment.  Corrective process for repeat offenders:  1. Tardiness: Three (3) episodes of rescheduling due to late arrivals will be recorded as one (1) "No Show". 2. Cancellation or reschedule: Three (3) cancellations or rescheduling will be recorded as one (1) "No Show". 3. "No Shows": Three (3) "No Shows" within a 12 month period will result in discharge from the practice.  ____________________________________________________________________________________________   ____________________________________________________________________________________________  Pain Scale  Introduction: The pain score used by this practice is the Verbal Numerical Rating Scale (VNRS-11). This is an 11-point scale. It is for adults and children 10 years or older. There are significant differences in how the pain score is reported, used, and applied. Forget everything you learned in the past and learn this scoring system.  General Information: The scale should reflect your current level of pain. Unless you are specifically asked for the level of your worst pain, or your average pain. If you are asked for one of these two, then it should be understood that it is over the past 24 hours.  Basic Activities of Daily Living (ADL): Personal hygiene, dressing, eating, transferring, and using restroom.  Instructions: Most patients tend to report their level of pain as a combination of two factors, their physical pain and their psychosocial pain. This last one is also known as "suffering" and it is  reflection of how physical pain affects you socially and psychologically. From now on, report them separately. From this point on, when asked to report your pain level, report only your physical pain. Use the following table for reference.  Pain Clinic Pain Levels (0-5/10)  Pain Level Score  Description  No Pain 0   Mild pain 1 Nagging, annoying, but does not interfere with basic activities of daily living (ADL). Patients are able to eat, bathe, get dressed, toileting (being able to get on and off the toilet and perform personal hygiene functions), transfer (move in and out of bed or a chair without assistance), and maintain continence (able to control bladder and bowel functions). Blood pressure and heart rate are unaffected. A normal heart rate for a healthy adult ranges from 60 to 100 bpm (beats per minute).   Mild to moderate pain 2 Noticeable and distracting. Impossible to hide from other people. More frequent flare-ups. Still possible to adapt and function close to normal. It can be very annoying and may have occasional stronger flare-ups. With discipline, patients may get used to it and adapt.   Moderate pain 3 Interferes significantly with activities of daily living (ADL). It becomes difficult to feed, bathe, get dressed, get on and off the toilet or to perform personal hygiene functions. Difficult to get in and out of bed or a chair without assistance. Very distracting. With effort, it can be ignored when deeply involved in activities.   Moderately severe pain 4 Impossible to ignore for more than a few minutes. With effort, patients may still be able to manage work or participate in some social activities. Very difficult to concentrate. Signs of autonomic nervous system discharge are evident: dilated pupils (mydriasis); mild sweating (diaphoresis); sleep interference. Heart rate becomes elevated (>115 bpm). Diastolic blood pressure (lower number) rises above 100 mmHg. Patients find relief in  laying down and not moving.   Severe pain 5 Intense and extremely unpleasant. Associated with frowning face and frequent crying. Pain overwhelms the senses.  Ability to do any activity or maintain social relationships becomes significantly limited. Conversation becomes difficult. Pacing back and  forth is common, as getting into a comfortable position is nearly impossible. Pain wakes you up from deep sleep. Physical signs will be obvious: pupillary dilation; increased sweating; goosebumps; brisk reflexes; cold, clammy hands and feet; nausea, vomiting or dry heaves; loss of appetite; significant sleep disturbance with inability to fall asleep or to remain asleep. When persistent, significant weight loss is observed due to the complete loss of appetite and sleep deprivation.  Blood pressure and heart rate becomes significantly elevated. Caution: If elevated blood pressure triggers a pounding headache associated with blurred vision, then the patient should immediately seek attention at an urgent or emergency care unit, as these may be signs of an impending stroke.    Emergency Department Pain Levels (6-10/10)  Emergency Room Pain 6 Severely limiting. Requires emergency care and should not be seen or managed at an outpatient pain management facility. Communication becomes difficult and requires great effort. Assistance to reach the emergency department may be required. Facial flushing and profuse sweating along with potentially dangerous increases in heart rate and blood pressure will be evident.   Distressing pain 7 Self-care is very difficult. Assistance is required to transport, or use restroom. Assistance to reach the emergency department will be required. Tasks requiring coordination, such as bathing and getting dressed become very difficult.   Disabling pain 8 Self-care is no longer possible. At this level, pain is disabling. The individual is unable to do even the most "basic" activities such as  walking, eating, bathing, dressing, transferring to a bed, or toileting. Fine motor skills are lost. It is difficult to think clearly.   Incapacitating pain 9 Pain becomes incapacitating. Thought processing is no longer possible. Difficult to remember your own name. Control of movement and coordination are lost.   The worst pain imaginable 10 At this level, most patients pass out from pain. When this level is reached, collapse of the autonomic nervous system occurs, leading to a sudden drop in blood pressure and heart rate. This in turn results in a temporary and dramatic drop in blood flow to the brain, leading to a loss of consciousness. Fainting is one of the body's self defense mechanisms. Passing out puts the brain in a calmed state and causes it to shut down for a while, in order to begin the healing process.    Summary: 1. Refer to this scale when providing [place] with your pain level. 2. Be accurate and careful when reporting your pain level. This will help with your care. 3. Over-reporting your pain level will lead to loss of credibility. 4. Even a level of 1/10 means that there is pain and will be treated at our facility. 5. High, inaccurate reporting will be documented as "Symptom Exaggeration", leading to loss of credibility and suspicions of possible secondary gains such as obtaining more narcotics, or wanting to appear disabled, for fraudulent reasons. 6. Only pain levels of 5 or below will be seen at our facility. 7. Pain levels of 6 and above will be sent to the Emergency Department and the appointment cancelled. ____________________________________________________________________________________________

## 2017-04-02 NOTE — Patient Instructions (Signed)

## 2017-04-02 NOTE — Progress Notes (Signed)
Safety precautions to be maintained throughout the outpatient stay will include: orient to surroundings, keep bed in low position, maintain call bell within reach at all times, provide assistance with transfer out of bed and ambulation.  

## 2017-04-03 ENCOUNTER — Other Ambulatory Visit: Payer: Self-pay | Admitting: Nurse Practitioner

## 2017-04-03 NOTE — Progress Notes (Signed)
Results were reviewed and found to be: mildly abnormal  No acute injury or pathology identified  Review would suggest interventional pain management techniques may be of benefit 

## 2017-04-04 ENCOUNTER — Encounter: Payer: Self-pay | Admitting: Nurse Practitioner

## 2017-04-04 ENCOUNTER — Other Ambulatory Visit: Payer: Self-pay | Admitting: Nurse Practitioner

## 2017-04-04 DIAGNOSIS — R7982 Elevated C-reactive protein (CRP): Secondary | ICD-10-CM | POA: Insufficient documentation

## 2017-04-04 DIAGNOSIS — R700 Elevated erythrocyte sedimentation rate: Secondary | ICD-10-CM

## 2017-04-06 LAB — COMP. METABOLIC PANEL (12)
ALBUMIN: 4.4 g/dL (ref 3.5–4.8)
ALK PHOS: 120 IU/L — AB (ref 39–117)
AST: 14 IU/L (ref 0–40)
Albumin/Globulin Ratio: 1.3 (ref 1.2–2.2)
BUN/Creatinine Ratio: 19 (ref 12–28)
BUN: 18 mg/dL (ref 8–27)
Bilirubin Total: <0.2 mg/dL (ref 0.0–1.2)
CALCIUM: 9.8 mg/dL (ref 8.7–10.3)
CREATININE: 0.93 mg/dL (ref 0.57–1.00)
Chloride: 104 mmol/L (ref 96–106)
GFR, EST AFRICAN AMERICAN: 72 mL/min/{1.73_m2} (ref >59)
GFR, EST NON AFRICAN AMERICAN: 62 mL/min/{1.73_m2} (ref >59)
GLUCOSE: 185 mg/dL — AB (ref 65–99)
Globulin, Total: 3.3 g/dL (ref 1.5–4.5)
Potassium: 4.4 mmol/L (ref 3.5–5.2)
Sodium: 143 mmol/L (ref 134–144)
TOTAL PROTEIN: 7.7 g/dL (ref 6.0–8.5)

## 2017-04-06 LAB — 25-HYDROXYVITAMIN D LCMS D2+D3
25-HYDROXY, VITAMIN D-2: 14 ng/mL
25-HYDROXY, VITAMIN D-3: 11 ng/mL

## 2017-04-06 LAB — 25-HYDROXY VITAMIN D LCMS D2+D3: 25-Hydroxy, Vitamin D: 25 ng/mL — ABNORMAL LOW

## 2017-04-06 LAB — VITAMIN B12: VITAMIN B 12: 307 pg/mL (ref 232–1245)

## 2017-04-06 LAB — C-REACTIVE PROTEIN: CRP: 31.4 mg/L — AB (ref 0.0–4.9)

## 2017-04-06 LAB — MAGNESIUM: Magnesium: 1.6 mg/dL (ref 1.6–2.3)

## 2017-04-06 LAB — SEDIMENTATION RATE: SED RATE: 83 mm/h — AB (ref 0–40)

## 2017-04-09 ENCOUNTER — Other Ambulatory Visit: Payer: Self-pay | Admitting: Unknown Physician Specialty

## 2017-04-09 NOTE — Telephone Encounter (Signed)
LR: 03/20/16 #90 1 RF LOV: 05/31/16 Refilled for 2 months Needs aOV

## 2017-04-10 LAB — COMPLIANCE DRUG ANALYSIS, UR

## 2017-04-11 DIAGNOSIS — M25552 Pain in left hip: Secondary | ICD-10-CM | POA: Diagnosis not present

## 2017-04-11 DIAGNOSIS — R251 Tremor, unspecified: Secondary | ICD-10-CM | POA: Diagnosis not present

## 2017-04-11 DIAGNOSIS — M25551 Pain in right hip: Secondary | ICD-10-CM | POA: Diagnosis not present

## 2017-04-16 DIAGNOSIS — E7800 Pure hypercholesterolemia, unspecified: Secondary | ICD-10-CM | POA: Diagnosis not present

## 2017-04-16 DIAGNOSIS — E118 Type 2 diabetes mellitus with unspecified complications: Secondary | ICD-10-CM | POA: Diagnosis not present

## 2017-04-16 DIAGNOSIS — E789 Disorder of lipoprotein metabolism, unspecified: Secondary | ICD-10-CM | POA: Diagnosis not present

## 2017-04-16 DIAGNOSIS — I2510 Atherosclerotic heart disease of native coronary artery without angina pectoris: Secondary | ICD-10-CM | POA: Diagnosis not present

## 2017-04-16 DIAGNOSIS — E1169 Type 2 diabetes mellitus with other specified complication: Secondary | ICD-10-CM | POA: Diagnosis not present

## 2017-04-16 DIAGNOSIS — J449 Chronic obstructive pulmonary disease, unspecified: Secondary | ICD-10-CM | POA: Diagnosis not present

## 2017-04-16 DIAGNOSIS — I10 Essential (primary) hypertension: Secondary | ICD-10-CM | POA: Diagnosis not present

## 2017-04-16 DIAGNOSIS — E034 Atrophy of thyroid (acquired): Secondary | ICD-10-CM | POA: Diagnosis not present

## 2017-04-23 DIAGNOSIS — J449 Chronic obstructive pulmonary disease, unspecified: Secondary | ICD-10-CM | POA: Diagnosis not present

## 2017-04-23 DIAGNOSIS — F17200 Nicotine dependence, unspecified, uncomplicated: Secondary | ICD-10-CM | POA: Diagnosis not present

## 2017-04-23 DIAGNOSIS — F411 Generalized anxiety disorder: Secondary | ICD-10-CM | POA: Diagnosis not present

## 2017-04-23 DIAGNOSIS — Z950 Presence of cardiac pacemaker: Secondary | ICD-10-CM | POA: Diagnosis not present

## 2017-04-23 DIAGNOSIS — I2510 Atherosclerotic heart disease of native coronary artery without angina pectoris: Secondary | ICD-10-CM | POA: Diagnosis not present

## 2017-04-23 DIAGNOSIS — I10 Essential (primary) hypertension: Secondary | ICD-10-CM | POA: Diagnosis not present

## 2017-04-23 DIAGNOSIS — E782 Mixed hyperlipidemia: Secondary | ICD-10-CM | POA: Diagnosis not present

## 2017-04-23 DIAGNOSIS — I209 Angina pectoris, unspecified: Secondary | ICD-10-CM | POA: Diagnosis not present

## 2017-04-23 DIAGNOSIS — I495 Sick sinus syndrome: Secondary | ICD-10-CM | POA: Diagnosis not present

## 2017-04-23 DIAGNOSIS — R0602 Shortness of breath: Secondary | ICD-10-CM | POA: Diagnosis not present

## 2017-05-01 ENCOUNTER — Other Ambulatory Visit: Payer: Self-pay | Admitting: Unknown Physician Specialty

## 2017-05-10 ENCOUNTER — Emergency Department: Payer: Medicare Other

## 2017-05-10 ENCOUNTER — Inpatient Hospital Stay: Payer: Medicare Other

## 2017-05-10 ENCOUNTER — Inpatient Hospital Stay
Admission: EM | Admit: 2017-05-10 | Discharge: 2017-05-21 | DRG: 100 | Disposition: A | Discharge status: Skilled Nursing Facility | Payer: Medicare Other | Attending: Internal Medicine | Admitting: Internal Medicine

## 2017-05-10 DIAGNOSIS — R41841 Cognitive communication deficit: Secondary | ICD-10-CM | POA: Diagnosis not present

## 2017-05-10 DIAGNOSIS — N189 Chronic kidney disease, unspecified: Secondary | ICD-10-CM | POA: Diagnosis present

## 2017-05-10 DIAGNOSIS — E785 Hyperlipidemia, unspecified: Secondary | ICD-10-CM | POA: Diagnosis present

## 2017-05-10 DIAGNOSIS — R451 Restlessness and agitation: Secondary | ICD-10-CM | POA: Diagnosis not present

## 2017-05-10 DIAGNOSIS — Z950 Presence of cardiac pacemaker: Secondary | ICD-10-CM

## 2017-05-10 DIAGNOSIS — G40309 Generalized idiopathic epilepsy and epileptic syndromes, not intractable, without status epilepticus: Secondary | ICD-10-CM | POA: Diagnosis not present

## 2017-05-10 DIAGNOSIS — R262 Difficulty in walking, not elsewhere classified: Secondary | ICD-10-CM | POA: Diagnosis not present

## 2017-05-10 DIAGNOSIS — R609 Edema, unspecified: Secondary | ICD-10-CM

## 2017-05-10 DIAGNOSIS — Z7984 Long term (current) use of oral hypoglycemic drugs: Secondary | ICD-10-CM | POA: Diagnosis not present

## 2017-05-10 DIAGNOSIS — R410 Disorientation, unspecified: Secondary | ICD-10-CM | POA: Diagnosis not present

## 2017-05-10 DIAGNOSIS — R1312 Dysphagia, oropharyngeal phase: Secondary | ICD-10-CM | POA: Diagnosis not present

## 2017-05-10 DIAGNOSIS — I130 Hypertensive heart and chronic kidney disease with heart failure and stage 1 through stage 4 chronic kidney disease, or unspecified chronic kidney disease: Secondary | ICD-10-CM | POA: Diagnosis present

## 2017-05-10 DIAGNOSIS — Z794 Long term (current) use of insulin: Secondary | ICD-10-CM

## 2017-05-10 DIAGNOSIS — J9601 Acute respiratory failure with hypoxia: Secondary | ICD-10-CM | POA: Diagnosis present

## 2017-05-10 DIAGNOSIS — E1122 Type 2 diabetes mellitus with diabetic chronic kidney disease: Secondary | ICD-10-CM | POA: Diagnosis present

## 2017-05-10 DIAGNOSIS — Z915 Personal history of self-harm: Secondary | ICD-10-CM

## 2017-05-10 DIAGNOSIS — T404X5A Adverse effect of other synthetic narcotics, initial encounter: Secondary | ICD-10-CM | POA: Diagnosis present

## 2017-05-10 DIAGNOSIS — Z4682 Encounter for fitting and adjustment of non-vascular catheter: Secondary | ICD-10-CM | POA: Diagnosis not present

## 2017-05-10 DIAGNOSIS — Z7951 Long term (current) use of inhaled steroids: Secondary | ICD-10-CM

## 2017-05-10 DIAGNOSIS — Z7982 Long term (current) use of aspirin: Secondary | ICD-10-CM

## 2017-05-10 DIAGNOSIS — Z9119 Patient's noncompliance with other medical treatment and regimen: Secondary | ICD-10-CM

## 2017-05-10 DIAGNOSIS — G311 Senile degeneration of brain, not elsewhere classified: Secondary | ICD-10-CM | POA: Diagnosis present

## 2017-05-10 DIAGNOSIS — R569 Unspecified convulsions: Secondary | ICD-10-CM | POA: Diagnosis not present

## 2017-05-10 DIAGNOSIS — Z4659 Encounter for fitting and adjustment of other gastrointestinal appliance and device: Secondary | ICD-10-CM

## 2017-05-10 DIAGNOSIS — Z888 Allergy status to other drugs, medicaments and biological substances status: Secondary | ICD-10-CM

## 2017-05-10 DIAGNOSIS — Z79890 Hormone replacement therapy (postmenopausal): Secondary | ICD-10-CM | POA: Diagnosis not present

## 2017-05-10 DIAGNOSIS — I10 Essential (primary) hypertension: Secondary | ICD-10-CM | POA: Diagnosis not present

## 2017-05-10 DIAGNOSIS — F419 Anxiety disorder, unspecified: Secondary | ICD-10-CM | POA: Diagnosis present

## 2017-05-10 DIAGNOSIS — I959 Hypotension, unspecified: Secondary | ICD-10-CM | POA: Diagnosis present

## 2017-05-10 DIAGNOSIS — F17210 Nicotine dependence, cigarettes, uncomplicated: Secondary | ICD-10-CM | POA: Diagnosis present

## 2017-05-10 DIAGNOSIS — Z0189 Encounter for other specified special examinations: Secondary | ICD-10-CM

## 2017-05-10 DIAGNOSIS — Z90710 Acquired absence of both cervix and uterus: Secondary | ICD-10-CM

## 2017-05-10 DIAGNOSIS — R600 Localized edema: Secondary | ICD-10-CM | POA: Diagnosis not present

## 2017-05-10 DIAGNOSIS — Z5189 Encounter for other specified aftercare: Secondary | ICD-10-CM | POA: Diagnosis not present

## 2017-05-10 DIAGNOSIS — Z79899 Other long term (current) drug therapy: Secondary | ICD-10-CM | POA: Diagnosis not present

## 2017-05-10 DIAGNOSIS — Z885 Allergy status to narcotic agent status: Secondary | ICD-10-CM

## 2017-05-10 DIAGNOSIS — I4510 Unspecified right bundle-branch block: Secondary | ICD-10-CM | POA: Diagnosis present

## 2017-05-10 DIAGNOSIS — I5022 Chronic systolic (congestive) heart failure: Secondary | ICD-10-CM | POA: Diagnosis present

## 2017-05-10 DIAGNOSIS — G9340 Encephalopathy, unspecified: Secondary | ICD-10-CM

## 2017-05-10 DIAGNOSIS — K219 Gastro-esophageal reflux disease without esophagitis: Secondary | ICD-10-CM | POA: Diagnosis present

## 2017-05-10 DIAGNOSIS — E876 Hypokalemia: Secondary | ICD-10-CM | POA: Diagnosis present

## 2017-05-10 DIAGNOSIS — G8929 Other chronic pain: Secondary | ICD-10-CM | POA: Diagnosis not present

## 2017-05-10 DIAGNOSIS — I2510 Atherosclerotic heart disease of native coronary artery without angina pectoris: Secondary | ICD-10-CM | POA: Diagnosis present

## 2017-05-10 DIAGNOSIS — R140 Abdominal distension (gaseous): Secondary | ICD-10-CM | POA: Diagnosis not present

## 2017-05-10 DIAGNOSIS — G40409 Other generalized epilepsy and epileptic syndromes, not intractable, without status epilepticus: Secondary | ICD-10-CM | POA: Diagnosis not present

## 2017-05-10 DIAGNOSIS — E039 Hypothyroidism, unspecified: Secondary | ICD-10-CM | POA: Diagnosis present

## 2017-05-10 DIAGNOSIS — F05 Delirium due to known physiological condition: Secondary | ICD-10-CM | POA: Diagnosis not present

## 2017-05-10 DIAGNOSIS — E119 Type 2 diabetes mellitus without complications: Secondary | ICD-10-CM | POA: Diagnosis not present

## 2017-05-10 DIAGNOSIS — F3289 Other specified depressive episodes: Secondary | ICD-10-CM | POA: Diagnosis not present

## 2017-05-10 DIAGNOSIS — M1991 Primary osteoarthritis, unspecified site: Secondary | ICD-10-CM | POA: Diagnosis present

## 2017-05-10 DIAGNOSIS — J449 Chronic obstructive pulmonary disease, unspecified: Secondary | ICD-10-CM | POA: Diagnosis present

## 2017-05-10 DIAGNOSIS — W19XXXA Unspecified fall, initial encounter: Secondary | ICD-10-CM | POA: Diagnosis present

## 2017-05-10 DIAGNOSIS — R29818 Other symptoms and signs involving the nervous system: Secondary | ICD-10-CM | POA: Diagnosis not present

## 2017-05-10 DIAGNOSIS — M6281 Muscle weakness (generalized): Secondary | ICD-10-CM | POA: Diagnosis not present

## 2017-05-10 LAB — CBC
HCT: 40.8 % (ref 35.0–47.0)
HEMOGLOBIN: 13.6 g/dL (ref 12.0–16.0)
MCH: 29.9 pg (ref 26.0–34.0)
MCHC: 33.4 g/dL (ref 32.0–36.0)
MCV: 89.4 fL (ref 80.0–100.0)
PLATELETS: 306 10*3/uL (ref 150–440)
RBC: 4.56 MIL/uL (ref 3.80–5.20)
RDW: 15.2 % — ABNORMAL HIGH (ref 11.5–14.5)
WBC: 10.1 10*3/uL (ref 3.6–11.0)

## 2017-05-10 LAB — COMPREHENSIVE METABOLIC PANEL
ALT: 19 U/L (ref 14–54)
AST: 24 U/L (ref 15–41)
Albumin: 4 g/dL (ref 3.5–5.0)
Alkaline Phosphatase: 114 U/L (ref 38–126)
Anion gap: 12 (ref 5–15)
BUN: 23 mg/dL — AB (ref 6–20)
CALCIUM: 8.9 mg/dL (ref 8.9–10.3)
CO2: 22 mmol/L (ref 22–32)
CREATININE: 1.19 mg/dL — AB (ref 0.44–1.00)
Chloride: 100 mmol/L — ABNORMAL LOW (ref 101–111)
GFR calc Af Amer: 52 mL/min — ABNORMAL LOW (ref >60)
GFR, EST NON AFRICAN AMERICAN: 45 mL/min — AB (ref >60)
Glucose, Bld: 286 mg/dL — ABNORMAL HIGH (ref 65–99)
POTASSIUM: 3.2 mmol/L — AB (ref 3.5–5.1)
Sodium: 134 mmol/L — ABNORMAL LOW (ref 135–145)
Total Bilirubin: 0.5 mg/dL (ref 0.3–1.2)
Total Protein: 8 g/dL (ref 6.5–8.1)

## 2017-05-10 LAB — URINE DRUG SCREEN, QUALITATIVE (ARMC ONLY)
Amphetamines, Ur Screen: NOT DETECTED
Barbiturates, Ur Screen: POSITIVE — AB
Benzodiazepine, Ur Scrn: NOT DETECTED
CANNABINOID 50 NG, UR ~~LOC~~: NOT DETECTED
Cocaine Metabolite,Ur ~~LOC~~: NOT DETECTED
MDMA (ECSTASY) UR SCREEN: NOT DETECTED
Methadone Scn, Ur: NOT DETECTED
Opiate, Ur Screen: NOT DETECTED
PHENCYCLIDINE (PCP) UR S: NOT DETECTED
Tricyclic, Ur Screen: NOT DETECTED

## 2017-05-10 LAB — ETHANOL

## 2017-05-10 LAB — GLUCOSE, CAPILLARY
Glucose-Capillary: 253 mg/dL — ABNORMAL HIGH (ref 65–99)
Glucose-Capillary: 316 mg/dL — ABNORMAL HIGH (ref 65–99)
Glucose-Capillary: 380 mg/dL — ABNORMAL HIGH (ref 65–99)
Glucose-Capillary: 215 mg/dL — ABNORMAL HIGH (ref 65–99)

## 2017-05-10 LAB — PROTIME-INR
INR: 1.04
PROTHROMBIN TIME: 13.5 s (ref 11.4–15.2)

## 2017-05-10 LAB — MRSA PCR SCREENING: MRSA BY PCR: NEGATIVE

## 2017-05-10 LAB — TSH: TSH: 39.968 u[IU]/mL — ABNORMAL HIGH (ref 0.350–4.500)

## 2017-05-10 MED ORDER — LEVETIRACETAM IN NACL 1000 MG/100ML IV SOLN: 1000.0000 mg | Freq: Once | INTRAVENOUS | Status: DC

## 2017-05-10 MED ORDER — ONDANSETRON HCL 4 MG/2ML IJ SOLN: 4.0000 mg | Freq: Four times a day (QID) | INTRAMUSCULAR | Status: DC | PRN

## 2017-05-10 MED ORDER — DOCUSATE SODIUM 100 MG PO CAPS
100.0000 mg | ORAL_CAPSULE | Freq: Two times a day (BID) | ORAL | Status: DC
  Administered 2017-05-16 – 2017-05-21 (×7): 100 mg via ORAL
  Filled 2017-05-10 (×10): qty 1

## 2017-05-10 MED ORDER — POTASSIUM CHLORIDE IN NACL 40-0.9 MEQ/L-% IV SOLN
INTRAVENOUS | Status: DC
  Administered 2017-05-10 (×2): 50 mL/h via INTRAVENOUS
  Filled 2017-05-10 (×3): qty 1000

## 2017-05-10 MED ORDER — LORAZEPAM 2 MG/ML IJ SOLN
1.0000 mg | Freq: Once | INTRAMUSCULAR | Status: AC
Start: 2017-05-10 — End: 2017-05-10
  Administered 2017-05-10: 1 mg via INTRAVENOUS

## 2017-05-10 MED ORDER — LEVETIRACETAM IN NACL 500 MG/100ML IV SOLN
500.0000 mg | Freq: Two times a day (BID) | INTRAVENOUS | Status: DC
  Filled 2017-05-10: qty 100

## 2017-05-10 MED ORDER — LABETALOL HCL 5 MG/ML IV SOLN
INTRAVENOUS | Status: AC
  Filled 2017-05-10: qty 4

## 2017-05-10 MED ORDER — LABETALOL HCL 5 MG/ML IV SOLN
10.0000 mg | Freq: Once | INTRAVENOUS | Status: AC
  Administered 2017-05-10: 10 mg via INTRAVENOUS

## 2017-05-10 MED ORDER — SODIUM CHLORIDE 0.9 % IV SOLN
1000.0000 mg | Freq: Once | INTRAVENOUS | Status: AC
  Administered 2017-05-10: 1000 mg via INTRAVENOUS
  Filled 2017-05-10: qty 10

## 2017-05-10 MED ORDER — HYDRALAZINE HCL 20 MG/ML IJ SOLN: 10.0000 mg | INTRAMUSCULAR | Status: DC | PRN

## 2017-05-10 MED ORDER — ONDANSETRON HCL 4 MG PO TABS: 4.0000 mg | ORAL_TABLET | Freq: Four times a day (QID) | ORAL | Status: DC | PRN

## 2017-05-10 MED ORDER — HEPARIN SODIUM (PORCINE) 5000 UNIT/ML IJ SOLN
5000.0000 [IU] | Freq: Three times a day (TID) | INTRAMUSCULAR | Status: DC
  Administered 2017-05-10 – 2017-05-21 (×32): 5000 [IU] via SUBCUTANEOUS
  Filled 2017-05-10 (×33): qty 1

## 2017-05-10 MED ORDER — DEXMEDETOMIDINE HCL IN NACL 400 MCG/100ML IV SOLN
0.4000 ug/kg/h | INTRAVENOUS | Status: DC
  Administered 2017-05-10 (×2): 1 ug/kg/h via INTRAVENOUS
  Administered 2017-05-10: 0.4 ug/kg/h via INTRAVENOUS
  Administered 2017-05-11: 1 ug/kg/h via INTRAVENOUS
  Administered 2017-05-11: 0.8 ug/kg/h via INTRAVENOUS
  Administered 2017-05-12 (×2): 1 ug/kg/h via INTRAVENOUS
  Administered 2017-05-12: 1.2 ug/kg/h via INTRAVENOUS
  Administered 2017-05-13: 0.5 ug/kg/h via INTRAVENOUS
  Administered 2017-05-13: 0.7 ug/kg/h via INTRAVENOUS
  Filled 2017-05-10 (×11): qty 100

## 2017-05-10 MED ORDER — ACETAMINOPHEN 650 MG RE SUPP
650.0000 mg | Freq: Four times a day (QID) | RECTAL | Status: DC | PRN
  Administered 2017-05-14: 650 mg via RECTAL
  Filled 2017-05-10: qty 1

## 2017-05-10 MED ORDER — POTASSIUM CHLORIDE 10 MEQ/100ML IV SOLN: 10.0000 meq | INTRAVENOUS | Status: DC

## 2017-05-10 MED ORDER — HYDRALAZINE HCL 20 MG/ML IJ SOLN
10.0000 mg | Freq: Once | INTRAMUSCULAR | Status: AC
  Administered 2017-05-10: 10 mg via INTRAVENOUS
  Filled 2017-05-10: qty 1

## 2017-05-10 MED ORDER — ACETAMINOPHEN 325 MG PO TABS
650.0000 mg | ORAL_TABLET | Freq: Four times a day (QID) | ORAL | Status: DC | PRN
  Administered 2017-05-20 – 2017-05-21 (×2): 650 mg via ORAL
  Filled 2017-05-10 (×3): qty 2

## 2017-05-10 MED ORDER — INSULIN GLARGINE 100 UNIT/ML ~~LOC~~ SOLN
8.0000 [IU] | Freq: Every day | SUBCUTANEOUS | Status: DC
  Administered 2017-05-10: 8 [IU] via SUBCUTANEOUS
  Filled 2017-05-10 (×2): qty 0.08

## 2017-05-10 MED ORDER — SODIUM CHLORIDE 0.9 % IV SOLN
500.0000 mg | Freq: Two times a day (BID) | INTRAVENOUS | Status: DC
  Administered 2017-05-10 – 2017-05-16 (×14): 500 mg via INTRAVENOUS
  Filled 2017-05-10 (×19): qty 5

## 2017-05-10 MED ORDER — INSULIN ASPART 100 UNIT/ML ~~LOC~~ SOLN
0.0000 [IU] | Freq: Four times a day (QID) | SUBCUTANEOUS | Status: DC
  Administered 2017-05-10: 3 [IU] via SUBCUTANEOUS
  Administered 2017-05-10: 9 [IU] via SUBCUTANEOUS
  Administered 2017-05-11 (×4): 3 [IU] via SUBCUTANEOUS
  Administered 2017-05-12: 1 [IU] via SUBCUTANEOUS
  Administered 2017-05-12 (×2): 2 [IU] via SUBCUTANEOUS
  Administered 2017-05-13: 1 [IU] via SUBCUTANEOUS
  Administered 2017-05-13: 2 [IU] via SUBCUTANEOUS
  Administered 2017-05-14: 1 [IU] via SUBCUTANEOUS
  Administered 2017-05-15 (×2): 2 [IU] via SUBCUTANEOUS
  Administered 2017-05-15: 1 [IU] via SUBCUTANEOUS
  Filled 2017-05-10 (×13): qty 1

## 2017-05-10 MED ORDER — ASPIRIN 300 MG RE SUPP
300.0000 mg | Freq: Once | RECTAL | Status: AC
  Administered 2017-05-10: 300 mg via RECTAL
  Filled 2017-05-10: qty 1

## 2017-05-10 MED ORDER — LORAZEPAM 2 MG/ML IJ SOLN
1.0000 mg | Freq: Once | INTRAMUSCULAR | Status: AC
  Administered 2017-05-10: 1 mg via INTRAVENOUS

## 2017-05-10 MED ORDER — HYDRALAZINE HCL 20 MG/ML IJ SOLN
10.0000 mg | INTRAMUSCULAR | Status: DC | PRN
Start: 2017-05-10 — End: 2017-05-21
  Administered 2017-05-10 – 2017-05-12 (×5): 20 mg via INTRAVENOUS
  Administered 2017-05-12: 10 mg via INTRAVENOUS
  Administered 2017-05-13 – 2017-05-15 (×7): 20 mg via INTRAVENOUS
  Administered 2017-05-16: 15 mg via INTRAVENOUS
  Filled 2017-05-10 (×14): qty 1

## 2017-05-10 NOTE — Consult Note (Addendum)
Name: [place] F Salomone MRN: [id number] DOB: [birth year]    ADMISSION DATE:  05/10/2017 CONSULTATION DATE: 05/10/2017  REFERRING MD : Dr. Sheryle Hail  CHIEF COMPLAINT: Fall   BRIEF PATIENT DESCRIPTION:  72 yo female admitted with acute encephalopathy and acute hypoxic respiratory failure likely secondary to seizure activity and possible drug overdose although CVA possible   SIGNIFICANT EVENTS  03/7-Pt admitted to stepdown unit   STUDIES:  CT Head 03/7>>No acute intracranial infarct or other process identified. ASPECTS is 10. Age-related cerebral atrophy with mild chronic small vessel `ischemic disease.  HISTORY OF PRESENT ILLNESS:   This is a 72 yo female with a PMH of Tremor, Pacemaker, HTN, Osteoarthritis, Hyperlipidemia, GERD, Diabetes Mellitus, Depression, COPD, Coronary Arteriosclerosis, CHF, and Arthritis.  She presented to [facility] ER via EMS from home with reports of an unwitnessed fall.  Per ER notes upon EMS arrival the pt was laying in a prone position on the floor unable to state what happened.  EMS reported the pt had pills laying around her on the floor they were uncertain if she took any medication or what the pills were.  Upon arrival to the ER the pt stated she took 3 Ultram.  However, she was unable to answer all questions appropriately, unable to hold her arms at a 45 degree angle, and her head was fixed to the left side.  She subsequently had a witnessed seizure in the ER, therefore she received 1 mg iv ativan x2 and was placed on NRB.  Her oxygenation improved and she was transitioned to 3L nasal canula, however she remained postictal.  She does not have a seizure hx.  She was subsequently admitted to the [facility] Unit by hospitalist team for further workup and treatment.  PAST MEDICAL HISTORY :   has a past medical history of Arthritis, CHF (congestive heart failure) (HCC), Coronary arteriosclerosis, DDD (degenerative disc disease), cervical, DDD (degenerative disc disease),  lumbar, Depression, Diabetes mellitus without complication (HCC), GERD (gastroesophageal reflux disease), Hyperlipidemia, Hypertension, Osteoarthritis, Pacemaker, and Tremor.  has a past surgical history that includes Abdominal hysterectomy; Cholecystectomy; Pacemaker insertion; Eye surgery (1964, 1966, and 1967); Foot surgery (Right); Breast biopsy (Right, 1994); and Spine surgery. Prior to Admission medications   Medication Sig Start Date End Date Taking? Authorizing Provider  albuterol (PROVENTIL HFA;VENTOLIN HFA) 108 (90 Base) MCG/ACT inhaler Inhale 2 puffs into the lungs every 6 (six) hours as needed for wheezing or shortness of breath.   Yes [provider]  amLODipine (NORVASC) 10 MG tablet TAKE 1 TABLET BY MOUTH EVERY DAY 08/28/16  Yes Gabriel Cirri, NP  benztropine (COGENTIN) 1 MG tablet Take 1 mg by mouth 2 (two) times daily.   Yes [provider]  citalopram (CELEXA) 20 MG tablet Take 20 mg by mouth daily.  01/05/16  Yes [provider]  docusate sodium (COLACE) 100 MG capsule TAKE 1 CAPSULE BY MOUTH EVERY DAY 01/02/17  Yes Gabriel Cirri, NP  etodolac (LODINE) 200 MG capsule TAKE 1 CAPSULE BY MOUTH EVERY 8 HOURS 04/17/17  Yes [provider]  fluticasone (FLONASE) 50 MCG/ACT nasal spray Place 1 spray into both nostrils daily. 04/14/16  Yes Gabriel Cirri, NP  furosemide (LASIX) 20 MG tablet TAKE 1 TABLET BY MOUTH EVERY DAY FOR FLUID 10/31/16  Yes Gabriel Cirri, NP  gabapentin (NEURONTIN) 300 MG capsule Take 300 mg by mouth 3 (three) times daily.  05/24/16 05/10/17 Yes [provider]  LANTUS SOLOSTAR 100 UNIT/ML Solostar Pen Inject 20 Units into the skin  daily. 03/20/16  Yes Gabriel Cirri, NP  levothyroxine (SYNTHROID, LEVOTHROID) 125 MCG tablet TAKE 1 TABLET BY MOUTH EVERY DAY 11/01/16  Yes Gabriel Cirri, NP  losartan (COZAAR) 100 MG tablet Take 1 tablet (100 mg total) by mouth daily. 10/26/16  Yes Gabriel Cirri, NP  metFORMIN (GLUCOPHAGE-XR) 500 MG  24 hr tablet Take 1,000 mg by mouth 2 (two) times daily.   Yes [provider]  metoprolol tartrate (LOPRESSOR) 25 MG tablet Take 25 mg by mouth 2 (two) times daily.   Yes [provider]  montelukast (SINGULAIR) 10 MG tablet Take 1 tablet (10 mg total) by mouth daily. 03/20/16  Yes Gabriel Cirri, NP  nitroGLYCERIN (NITRODUR - DOSED IN MG/24 HR) 0.2 mg/hr patch APPLY 1 PATCH ONTO THE SKIN ONCE DAILY 09/01/16  Yes Particia Nearing, PA-C  nitroGLYCERIN (NITROSTAT) 0.4 MG SL tablet Place 0.4 mg under the tongue every 5 (five) minutes as needed for chest pain.   Yes [provider]  primidone (MYSOLINE) 50 MG tablet Take 50 mg by mouth 2 (two) times daily. 04/11/17  Yes [provider]  ranitidine (ZANTAC) 150 MG tablet TAKE 1 TABLET (150 MG TOTAL) BY MOUTH 2 (TWO) TIMES DAILY. 06/02/16  Yes Gabriel Cirri, NP  REXULTI 1 MG TABS Take 1 mg by mouth daily. 01/05/16  Yes [provider]  rosuvastatin (CRESTOR) 20 MG tablet TAKE 1 TABLET BY MOUTH EVERY DAY AT BEDTIME 04/09/17  Yes Gabriel Cirri, NP  traMADol (ULTRAM) 50 MG tablet Take 1 tablet (50 mg total) by mouth every 8 (eight) hours as needed. 05/31/16  Yes Gabriel Cirri, NP  Vitamin D, Ergocalciferol, (DRISDOL) 50000 units CAPS capsule TAKE ONE CAPSULE BY MOUTH MONTHLY 01/29/17  Yes Johnson, Megan P, DO  aspirin 81 MG EC tablet TAKE 1 TABLET (81 MG TOTAL) BY MOUTH DAILY. 03/15/17   Gabriel Cirri, NP  TRUETRACK TEST test strip USE TO CHECK BLOOD SUGAR LEVEL ONCE DAILY. DX CODE E11.65 05/02/17   Gabriel Cirri, NP  Stann Ore SHORT PEN NEEDLES 31G X 8 MM MISC  01/31/16   [provider]   Allergies  Allergen Reactions  . Alprazolam Swelling  . Demerol [Meperidine] Itching  . Fentanyl Other (See Comments)    "burning and hot"    FAMILY HISTORY:  family history includes Aneurysm in her father; Breast cancer (age of onset: 42) in her maternal aunt; Cancer in her mother. SOCIAL HISTORY:  reports  that she has been smoking cigarettes.  She has a 37.50 pack-year smoking history. she has never used smokeless tobacco. She reports that she does not drink alcohol or use drugs.  REVIEW OF SYSTEMS:   Unable to assess pt lethargic   SUBJECTIVE:  Unable to assess pt lethargic   VITAL SIGNS: Pulse Rate:  [73-98] 73 (03/07 0530) Resp:  [16-29] 17 (03/07 0530) BP: (179-241)/(71-93) 179/74 (03/07 0530) SpO2:  [88 %-100 %] 100 % (03/07 0530)  PHYSICAL EXAMINATION: General: acutely ill appearing female, NAD  Neuro: lethargic, not following commands, PERRL  HEENT:supple, no JVD Cardiovascular: nsr, s1s2, no M/R/G Lungs: clear throughout, even, non labored  Abdomen: +BS x4, obese, soft, non tender Musculoskeletal: normal bulk and tone  Skin: intact no rashes or lesions   Recent Labs  Lab 05/10/17 0315  NA 134*  K 3.2*  CL 100*  CO2 22  BUN 23*  CREATININE 1.19*  GLUCOSE 286*   Recent Labs  Lab 05/10/17 0243  HGB 13.6  HCT 40.8  WBC 10.1  PLT  306   Ct Head Code Stroke Wo Contrast`  Result Date: 05/10/2017 CLINICAL DATA:  Code stroke. Initial evaluation for possible seizure, overdose. EXAM: CT HEAD WITHOUT CONTRAST TECHNIQUE: Contiguous axial images were obtained from the base of the skull through the vertex without intravenous contrast. COMPARISON:  None available. FINDINGS: Brain: Generalized age-related cerebral atrophy with mild chronic small vessel ischemic disease. No acute intracranial hemorrhage. No acute large vessel territory infarct. No mass lesion, midline shift, or mass effect. Ventricular prominence related to global parenchymal volume loss of hydrocephalus. No extra-axial fluid collection. Vascular: No hyperdense vessel. Calcified atherosclerosis at the skull base. Skull: Scalp soft tissues and calvarium within normal limits. Sinuses/Orbits: Visualized paranasal sinuses are clear. No mastoid effusion. Other: Globes and orbital soft tissues within normal limits. Patient  status post cataract extraction on the left. ASPECTS [facility] Stroke Program Early CT Score) - Ganglionic level infarction (caudate, lentiform nuclei, internal capsule, insula, M1-M3 cortex): 7 - Supraganglionic infarction (M4-M6 cortex): 3 Total score (0-10 with 10 being normal): 10 IMPRESSION: 1. No acute intracranial infarct or other process identified. 2. ASPECTS is 10. 3. Age-related cerebral atrophy with mild chronic small vessel ischemic disease. Critical Value/emergent results were called by telephone at the time of interpretation on 05/10/2017 at 3:23 am to Dr. Bayard Males , who verbally acknowledged these results. Electronically Signed   By: Rise Mu M.D.   On: 05/10/2017 03:26    ASSESSMENT / PLAN: Acute encephalopathy likely secondary to seizure activity and possible drug overdose although CVA possible  Acute hypoxic respiratory failure  Hypokalemia  Acute on chronic renal failure  Hypertension  Hx: Diabetes Mellitus, Pacemaker, CHF   P: Supplemental O2 for dyspnea and/or hypoxia  Avoid sedating medication  Seizure and Aspiration precautions EEG pending  Neurology consulted appreciate input  Continue IV keppra  Allow for permissive hypertension in setting of possible CVA  NS with 40 meq KCL @50  ml/hr Trend BMP Replace electrolytes as indicated  Monitor UOP Subq heparin for VTE prophylaxis  Trend CBC  Monitor for s/sx of bleeding and transfuse for hgb <7 SSI   Sonda Rumble, AGNP  Pulmonary/Critical Care Pager [phone] (please enter 7 digits) PCCM Consult Pager [phone] (please enter 7 digits)

## 2017-05-10 NOTE — Progress Notes (Signed)
Inpatient Diabetes Program Recommendations  AACE/ADA: New Consensus Statement on Inpatient Glycemic Control (2015)  Target Ranges:  Prepandial:   less than 140 mg/dL      Peak postprandial:   less than 180 mg/dL (1-2 hours)      Critically ill patients:  140 - 180 mg/dL   Results for Melanie King, Melanie King (MRN [id number]) as of 05/10/2017 07:36  Ref. Range 05/10/2017 02:34 05/10/2017 06:01  Glucose-Capillary Latest Ref Range: 65 - 99 mg/dL 732 (H) 202 (H)   Review of Glycemic Control  Diabetes history: DM2 Outpatient Diabetes medications: Lantus 20 units daily, Metformin XR 1000 mg BID Current orders for Inpatient glycemic control: Lantus 8 units QHS, Novolog 0-9 units Q6H  Inpatient Diabetes Program Recommendations:  Insulin - Basal: Lantus 8 units QHS ordered to start today at bedtime. Please consider changing frequency of Lantus to daily and start this morning.  Thanks, Orlando Penner, RN, MSN, CDE Diabetes Coordinator Inpatient Diabetes Program [phone] (Team Pager from 8am to 5pm)

## 2017-05-10 NOTE — Progress Notes (Signed)
   05/10/17 1140  Clinical Encounter Type  Visited With Other (Comment) (friend)  Visit Type Initial   Introductory chaplain visit; no needs to report at present.  Encouraged them to call if needs change.

## 2017-05-10 NOTE — ED Provider Notes (Signed)
[facility] Emergency Department Provider Note __   First MD Initiated Contact with Patient 05/10/17 0225     (approximate)  I have reviewed the triage vital signs and the nursing notes.  Level 5 caveat: History limited secondary to altered mental status. HISTORY  Chief Complaint Fall   HPI Melanie King is a 72 y.o. female with below list of chronic medical conditions presents to the emergency department via EMS following unwitnessed fall.  EMS states that the patient was found face down with multiple pills including tramadol around her.  Patient states that she took 3 Tramadol tablets tonight.  Patient denies any suicidal ideation.   Past Medical History:  Diagnosis Date  . Arthritis   . CHF (congestive heart failure) (HCC)   . Coronary arteriosclerosis   . DDD (degenerative disc disease), cervical   . DDD (degenerative disc disease), lumbar   . Depression   . Diabetes mellitus without complication (HCC)   . GERD (gastroesophageal reflux disease)   . Hyperlipidemia   . Hypertension   . Osteoarthritis   . Pacemaker   . Tremor     Patient Active Problem List   Diagnosis Date Noted  . Seizure (HCC) 05/10/2017  . Elevated sedimentation rate 04/04/2017  . Elevated C-reactive protein (CRP) 04/04/2017  . Chest pain, unspecified 04/02/2017  . Bilateral lower extremity pain (Secondary Area of Pain) (R>L) 04/02/2017  . Chronic hip pain, bilateral  (Secondary Area of Pain) (R>L) 04/02/2017  . Chronic bilateral low back pain with bilateral sciatica [facility] Area of Pain) (R>L) 04/02/2017  . Chronic pain syndrome 04/02/2017  . Disorder of bone, unspecified 04/02/2017  . Other long term (current) drug therapy 04/02/2017  . Other specified health status 04/02/2017  . Long term current use of opiate analgesic 04/02/2017  . Hip pain, bilateral 12/06/2016  . Tremor 05/27/2016  . Trigger finger of left hand 02/16/2016  . Osteoarthritis of both hands  02/16/2016  . Angina pectoris (HCC) 01/17/2016  . COPD (chronic obstructive pulmonary disease) (HCC) 01/17/2016  . CAD (coronary artery disease) 01/17/2016  . HLD (hyperlipidemia) 01/17/2016  . Hypertension 01/17/2016  . Sick sinus syndrome (HCC) 01/17/2016  . Chronic pain 01/17/2016  . Personal history of tobacco use, presenting hazards to health 11/16/2015  . Diabetes (HCC) 05/12/2013    Past Surgical History:  Procedure Laterality Date  . ABDOMINAL HYSTERECTOMY    . BREAST BIOPSY Right 1994   neg cyst removed  . CHOLECYSTECTOMY    . EYE SURGERY  1964, 1966, and 1967   bilateral  . FOOT SURGERY Right    cellulitis  . PACEMAKER INSERTION    . SPINE SURGERY     cyst removed; [facility]    Prior to Admission medications   Medication Sig Start Date End Date Taking? Authorizing Provider  albuterol (PROVENTIL HFA;VENTOLIN HFA) 108 (90 Base) MCG/ACT inhaler Inhale 2 puffs into the lungs every 6 (six) hours as needed for wheezing or shortness of breath.   Yes [provider]  amLODipine (NORVASC) 10 MG tablet TAKE 1 TABLET BY MOUTH EVERY DAY 08/28/16  Yes Gabriel Cirri, NP  benztropine (COGENTIN) 1 MG tablet Take 1 mg by mouth 2 (two) times daily.   Yes [provider]  citalopram (CELEXA) 20 MG tablet Take 20 mg by mouth daily.  01/05/16  Yes [provider]  docusate sodium (COLACE) 100 MG capsule TAKE 1 CAPSULE BY MOUTH EVERY DAY 01/02/17  Yes Gabriel Cirri, NP  etodolac (  LODINE) 200 MG capsule TAKE 1 CAPSULE BY MOUTH EVERY 8 HOURS 04/17/17  Yes [provider]  fluticasone (FLONASE) 50 MCG/ACT nasal spray Place 1 spray into both nostrils daily. 04/14/16  Yes Gabriel Cirri, NP  furosemide (LASIX) 20 MG tablet TAKE 1 TABLET BY MOUTH EVERY DAY FOR FLUID 10/31/16  Yes Gabriel Cirri, NP  gabapentin (NEURONTIN) 300 MG capsule Take 300 mg by mouth 3 (three) times daily.  05/24/16 05/10/17 Yes [provider]  LANTUS SOLOSTAR 100 UNIT/ML Solostar  Pen Inject 20 Units into the skin daily. 03/20/16  Yes Gabriel Cirri, NP  levothyroxine (SYNTHROID, LEVOTHROID) 125 MCG tablet TAKE 1 TABLET BY MOUTH EVERY DAY 11/01/16  Yes Gabriel Cirri, NP  losartan (COZAAR) 100 MG tablet Take 1 tablet (100 mg total) by mouth daily. 10/26/16  Yes Gabriel Cirri, NP  metFORMIN (GLUCOPHAGE-XR) 500 MG 24 hr tablet Take 1,000 mg by mouth 2 (two) times daily.   Yes [provider]  metoprolol tartrate (LOPRESSOR) 25 MG tablet Take 25 mg by mouth 2 (two) times daily.   Yes [provider]  montelukast (SINGULAIR) 10 MG tablet Take 1 tablet (10 mg total) by mouth daily. 03/20/16  Yes Gabriel Cirri, NP  nitroGLYCERIN (NITRODUR - DOSED IN MG/24 HR) 0.2 mg/hr patch APPLY 1 PATCH ONTO THE SKIN ONCE DAILY 09/01/16  Yes Particia Nearing, PA-C  nitroGLYCERIN (NITROSTAT) 0.4 MG SL tablet Place 0.4 mg under the tongue every 5 (five) minutes as needed for chest pain.   Yes [provider]  primidone (MYSOLINE) 50 MG tablet Take 50 mg by mouth 2 (two) times daily. 04/11/17  Yes [provider]  ranitidine (ZANTAC) 150 MG tablet TAKE 1 TABLET (150 MG TOTAL) BY MOUTH 2 (TWO) TIMES DAILY. 06/02/16  Yes Gabriel Cirri, NP  REXULTI 1 MG TABS Take 1 mg by mouth daily. 01/05/16  Yes [provider]  rosuvastatin (CRESTOR) 20 MG tablet TAKE 1 TABLET BY MOUTH EVERY DAY AT BEDTIME 04/09/17  Yes Gabriel Cirri, NP  traMADol (ULTRAM) 50 MG tablet Take 1 tablet (50 mg total) by mouth every 8 (eight) hours as needed. 05/31/16  Yes Gabriel Cirri, NP  Vitamin D, Ergocalciferol, (DRISDOL) 50000 units CAPS capsule TAKE ONE CAPSULE BY MOUTH MONTHLY 01/29/17  Yes Johnson, Megan P, DO  aspirin 81 MG EC tablet TAKE 1 TABLET (81 MG TOTAL) BY MOUTH DAILY. 03/15/17   Gabriel Cirri, NP  TRUETRACK TEST test strip USE TO CHECK BLOOD SUGAR LEVEL ONCE DAILY. DX CODE E11.65 05/02/17   Gabriel Cirri, NP  Stann Ore SHORT PEN NEEDLES 31G X 8 MM MISC  01/31/16   [provider]    Allergies Alprazolam; Demerol [meperidine]; and Fentanyl  Family History  Problem Relation Age of Onset  . Breast cancer Maternal Aunt 40  . Cancer Mother        colon  . Aneurysm Father     Social History Social History   Tobacco Use  . Smoking status: Current Every Day Smoker    Packs/day: 0.75    Years: 50.00    Pack years: 37.50    Types: Cigarettes  . Smokeless tobacco: Never Used  Substance Use Topics  . Alcohol use: No  . Drug use: No    Review of Systems Constitutional: No fever/chills Eyes: No visual changes. ENT: No sore throat. Cardiovascular: Denies chest pain. Respiratory: Denies shortness of breath. Gastrointestinal: No abdominal pain.  No nausea, no vomiting.  No diarrhea.  No constipation. Genitourinary: Negative  for dysuria. Musculoskeletal: Negative for neck pain.  Negative for back pain. Integumentary: Negative for rash. Neurological: Negative for headaches, focal weakness or numbness.   ____________________________________________   PHYSICAL EXAM:  VITAL SIGNS: ED Triage Vitals  Enc Vitals Group     BP 05/10/17 0230 (!) 228/80     Pulse Rate 05/10/17 0230 88     Resp 05/10/17 0300 (!) 23     Temp --      Temp src --      SpO2 05/10/17 0228 98 %     Weight --      Height --      Head Circumference --      Peak Flow --      Pain Score --      Pain Loc --      Pain Edu? --      Excl. in GC? --     Constitutional: Alert  Eyes: Conjunctivae are normal.  Horizontal nystagmus head: Atraumatic. Mouth/Throat: Mucous membranes are moist.  Oropharynx non-erythematous. Neck: No stridor.  No meningeal signs. Cardiovascular: Normal rate, regular rhythm. Good peripheral circulation. Grossly normal heart sounds. Respiratory: Normal respiratory effort.  No retractions. Lungs CTAB. Gastrointestinal: Soft and nontender. No distention.  Musculoskeletal: No lower extremity tenderness nor edema. No gross deformities of  extremities. Neurologic: Slurred speech, right hemineglect with noted horizontal nystagmus. No gross focal neurologic deficits are appreciated.  Skin:  Skin is warm, dry and intact. No rash noted. Psychiatric: Mood and affect are normal. Speech and behavior are normal.  ____________________________________________   LABS (all labs ordered are listed, but only abnormal results are displayed)  Labs Reviewed  GLUCOSE, CAPILLARY - Abnormal; Notable for the following components:      Result Value   Glucose-Capillary 253 (*)    All other components within normal limits  CBC - Abnormal; Notable for the following components:   RDW 15.2 (*)    All other components within normal limits  URINE DRUG SCREEN, QUALITATIVE ([facility] ONLY) - Abnormal; Notable for the following components:   Barbiturates, Ur Screen POSITIVE (*)    All other components within normal limits  COMPREHENSIVE METABOLIC PANEL - Abnormal; Notable for the following components:   Sodium 134 (*)    Potassium 3.2 (*)    Chloride 100 (*)    Glucose, Bld 286 (*)    BUN 23 (*)    Creatinine, Ser 1.19 (*)    GFR calc non Af Amer 45 (*)    GFR calc Af Amer 52 (*)    All other components within normal limits  ETHANOL  PROTIME-INR   ____________________________________________  EKG  ED ECG REPORT I, [place] N Mailey Landstrom, the attending physician, personally viewed and interpreted this ECG.   Date: 05/10/2017  EKG Time: 2:35 AM  Rate: 102  Rhythm: Sinus tachycardia right bundle branch block Axis: Normal  Intervals: Normal  ST&T Change: None  ____________________________________________  RADIOLOGY I, [place] Dashanna Kinnamon, personally viewed and evaluated these images (plain radiographs) as part of my medical decision making, as well as reviewing the written report by the radiologist.  ED MD interpretation: No acute intracranial abnormality noted per radiologist.  Official radiology report(s): Ct Head Code Stroke Wo  Contrast`  Result Date: 05/10/2017 CLINICAL DATA:  Code stroke. Initial evaluation for possible seizure, overdose. EXAM: CT HEAD WITHOUT CONTRAST TECHNIQUE: Contiguous axial images were obtained from the base of the skull through the vertex without intravenous contrast. COMPARISON:  None available. FINDINGS: Brain: Generalized age-related  cerebral atrophy with mild chronic small vessel ischemic disease. No acute intracranial hemorrhage. No acute large vessel territory infarct. No mass lesion, midline shift, or mass effect. Ventricular prominence related to global parenchymal volume loss of hydrocephalus. No extra-axial fluid collection. Vascular: No hyperdense vessel. Calcified atherosclerosis at the skull base. Skull: Scalp soft tissues and calvarium within normal limits. Sinuses/Orbits: Visualized paranasal sinuses are clear. No mastoid effusion. Other: Globes and orbital soft tissues within normal limits. Patient status post cataract extraction on the left. ASPECTS [facility] Stroke Program Early CT Score) - Ganglionic level infarction (caudate, lentiform nuclei, internal capsule, insula, M1-M3 cortex): 7 - Supraganglionic infarction (M4-M6 cortex): 3 Total score (0-10 with 10 being normal): 10 IMPRESSION: 1. No acute intracranial infarct or other process identified. 2. ASPECTS is 10. 3. Age-related cerebral atrophy with mild chronic small vessel ischemic disease. Critical Value/emergent results were called by telephone at the time of interpretation on 05/10/2017 at 3:23 am to Dr. Bayard Males , who verbally acknowledged these results. Electronically Signed   By: Rise Mu M.D.   On: 05/10/2017 03:26    ____________________________________________   PROCEDURES  Critical Care performed: CRITICAL CARE Performed by: Darci Current   Total critical care time: 45 minutes  Critical care time was exclusive of separately billable procedures and treating other patients.  Critical care was  necessary to treat or prevent imminent or life-threatening deterioration.  Critical care was time spent personally by me on the following activities: development of treatment plan with patient and/or surrogate as well as nursing, discussions with consultants, evaluation of patient's response to treatment, examination of patient, obtaining history from patient or surrogate, ordering and performing treatments and interventions, ordering and review of laboratory studies, ordering and review of radiographic studies, pulse oximetry and re-evaluation of patient's condition.   Procedures   ____________________________________________   INITIAL IMPRESSION / ASSESSMENT AND PLAN / ED COURSE  As part of my medical decision making, I reviewed the following data within the electronic MEDICAL RECORD NUMBER   72 year old female presenting with above-stated history and physical exam that is post unwitnessed fall.  Following initial evaluation patient returned to the room to see the patient and witnessed a generalized tonic-clonic seizure.  Patient was given IV Ativan 1 mg with continued seizure-like activity and as such an additional milligram was given.  No further seizure activity noted following second Ativan administration.  CT scan of the head was performed which revealed no acute intracranial abnormality.  Patient discussed with Dr. Sheryle Hail for hospital admission for further evaluation and management for generalized tonic-clonic seizure with no previous seizure history. ____________________________________________  FINAL CLINICAL IMPRESSION(S) / ED DIAGNOSES  Final diagnoses:  Generalized tonic-clonic seizure (HCC)     MEDICATIONS GIVEN DURING THIS VISIT:  Medications  LORazepam (ATIVAN) injection 1 mg (1 mg Intravenous Given 05/10/17 0242)  LORazepam (ATIVAN) injection 1 mg (1 mg Intravenous Given 05/10/17 0257)  levETIRAcetam (KEPPRA) 1,000 mg in sodium chloride 0.9 % 100 mL IVPB (0 mg Intravenous  Stopped 05/10/17 0330)  labetalol (NORMODYNE,TRANDATE) injection 10 mg (10 mg Intravenous Given 05/10/17 0402)     ED Discharge Orders    None       Note:  This document was prepared using Dragon voice recognition software and may include unintentional dictation errors.    Darci Current, MD 05/10/17 [phone]

## 2017-05-10 NOTE — ED Notes (Addendum)
Dr. Sheryle Hail at bedside to admit. Patient taken off non-rebreather and placed on 3L [place] and sats stayed 99%

## 2017-05-10 NOTE — Progress Notes (Signed)
Patient ID: Melanie King, female   DOB: [birth year], 72 y.o.   MRN: [id number]  [employer] PROGRESS NOTE  Melanie King JSH:[id number] DOB: 07-30-1945 DOA: 05/10/2017 PCP: Marletta Lor, NP  HPI/Subjective: Patient was placed on Precedex drip for agitation.  Patient unable to give any history at this time.  Apparently was found down with tramadol.  Urine toxicology for positive for barbiturates.   Objective: Vitals:   05/10/17 1100 05/10/17 1200  BP: (!) 126/53 (!) 126/52  Pulse: 70 70  Resp: 18 17  SpO2: 95% 97%    Filed Weights   05/10/17 0900  Weight: 62 kg (136 lb 11 oz)    ROS: Review of Systems  Unable to perform ROS: Acuity of condition   Exam: Physical Exam  HENT:  Nose: No mucosal edema.  Mouth/Throat: No oropharyngeal exudate.  Eyes: Conjunctivae and lids are normal. Pupils are equal, round, and reactive to light.  Neck: Carotid bruit is not present. No thyromegaly present.  Respiratory: She has no decreased breath sounds. She has no wheezes. She has no rhonchi. She has no rales.  GI: Soft. Bowel sounds are normal. There is no tenderness.  Musculoskeletal:       Right ankle: She exhibits no swelling.       Left ankle: She exhibits no swelling.  Lymphadenopathy:    She has no cervical adenopathy.  Neurological: She is unresponsive.  Skin: Skin is dry. No rash noted. No cyanosis.  Psychiatric:  Unable to assess      Data Reviewed: Basic Metabolic Panel: Recent Labs  Lab 05/10/17 0315  NA 134*  K 3.2*  CL 100*  CO2 22  GLUCOSE 286*  BUN 23*  CREATININE 1.19*  CALCIUM 8.9   Liver Function Tests: Recent Labs  Lab 05/10/17 0315  AST 24  ALT 19  ALKPHOS 114  BILITOT 0.5  PROT 8.0  ALBUMIN 4.0   CBC: Recent Labs  Lab 05/10/17 0243  WBC 10.1  HGB 13.6  HCT 40.8  MCV 89.4  PLT 306    CBG: Recent Labs  Lab 05/10/17 0234 05/10/17 0601 05/10/17 1138  GLUCAP 253* 316* 380*    Recent Results (from the past 240 hour(s))   MRSA PCR Screening     Status: None   Collection Time: 05/10/17  6:06 AM  Result Value Ref Range Status   MRSA by PCR NEGATIVE NEGATIVE Final    Comment:        The GeneXpert MRSA Assay (FDA approved for NASAL specimens only), is one component of a comprehensive MRSA colonization surveillance program. It is not intended to diagnose MRSA infection nor to guide or monitor treatment for MRSA infections. Performed at [facility], [address], [place], [place] [address]      Studies: Ct Head Code Stroke Wo Contrast`  Result Date: 05/10/2017 CLINICAL DATA:  Code stroke. Initial evaluation for possible seizure, overdose. EXAM: CT HEAD WITHOUT CONTRAST TECHNIQUE: Contiguous axial images were obtained from the base of the skull through the vertex without intravenous contrast. COMPARISON:  None available. FINDINGS: Brain: Generalized age-related cerebral atrophy with mild chronic small vessel ischemic disease. No acute intracranial hemorrhage. No acute large vessel territory infarct. No mass lesion, midline shift, or mass effect. Ventricular prominence related to global parenchymal volume loss of hydrocephalus. No extra-axial fluid collection. Vascular: No hyperdense vessel. Calcified atherosclerosis at the skull base. Skull: Scalp soft tissues and calvarium within normal limits. Sinuses/Orbits: Visualized paranasal sinuses are clear. No mastoid effusion.  Other: Globes and orbital soft tissues within normal limits. Patient status post cataract extraction on the left. ASPECTS [facility] Stroke Program Early CT Score) - Ganglionic level infarction (caudate, lentiform nuclei, internal capsule, insula, M1-M3 cortex): 7 - Supraganglionic infarction (M4-M6 cortex): 3 Total score (0-10 with 10 being normal): 10 IMPRESSION: 1. No acute intracranial infarct or other process identified. 2. ASPECTS is 10. 3. Age-related cerebral atrophy with mild chronic small vessel ischemic disease. Critical  Value/emergent results were called by telephone at the time of interpretation on 05/10/2017 at 3:23 am to Dr. Bayard Males , who verbally acknowledged these results. Electronically Signed   By: Rise Mu M.D.   On: 05/10/2017 03:26    Scheduled Meds: . docusate sodium  100 mg Oral BID  . heparin  5,000 Units Subcutaneous Q8H  . insulin aspart  0-9 Units Subcutaneous Q6H  . insulin glargine  8 Units Subcutaneous QHS   Continuous Infusions: . 0.9 % NaCl with KCl 40 mEq / L 50 mL/hr (05/10/17 1037)  . dexmedetomidine (PRECEDEX) IV infusion 1 mcg/kg/hr (05/10/17 1300)  . levETIRAcetam (KEPPRA) IVPB      Assessment/Plan:  1. Seizure.  Patient had tramadol which can lessen the threshold for seizure.  Patient placed on Keppra.  EEG and MRI when able. 2. Agitation patient placed on Precedex drip 3. Hypothyroidism unspecified consider IV levothyroxine while n.p.o. 4. History of CAD on rectal aspirin 5. Essential hypertension holding medications while n.p.o. 6. Type 2 diabetes mellitus on sliding scale and low-dose glargine insulin.  Careful while patient n.p.o.  Code Status:     Code Status Orders  (From admission, onward)        Start     Ordered   05/10/17 0603  Full code  Continuous     05/10/17 0602    Code Status History    Date Active Date Inactive Code Status Order ID Comments User Context   03/28/2017 22:02 03/30/2017 21:57 Full Code [id number]  Oralia Manis, MD Inpatient     Family Communication: As per critical care specialist Disposition Plan: To be determined  Consultants:  Critical care specialist  Neurology  Time spent: 25 minutes, case discussed with nursing staff  Alford Highland  [employer]

## 2017-05-10 NOTE — Consult Note (Signed)
Reason for Consult:Seizure Referring Physician: Kasa  CC: Seizure  HPI: Melanie King is an 72 y.o. female who is unable to provide any history today due to altered mental status.  No family available therefore all history obtained from the chart.  Patient presented to the emergency department after being found unconscious face down in a pile of her medications.  The patient had open tramadol scattered around her.  Her friends who found her reported that she has not seen a doctor for a number of months and cannot keep up with her medications.  They state it is unclear what she is supposed to be taking.  In the emergency department the patient had a generalized tonic-clonic seizure.  She was given Ativan which made her very somnolent and she has not regained baseline mental status.   Past Medical History:  Diagnosis Date  . Arthritis   . CHF (congestive heart failure) (HCC)   . Coronary arteriosclerosis   . DDD (degenerative disc disease), cervical   . DDD (degenerative disc disease), lumbar   . Depression   . Diabetes mellitus without complication (HCC)   . GERD (gastroesophageal reflux disease)   . Hyperlipidemia   . Hypertension   . Osteoarthritis   . Pacemaker   . Tremor     Past Surgical History:  Procedure Laterality Date  . ABDOMINAL HYSTERECTOMY    . BREAST BIOPSY Right 1994   neg cyst removed  . CHOLECYSTECTOMY    . EYE SURGERY  1964, 1966, and 1967   bilateral  . FOOT SURGERY Right    cellulitis  . PACEMAKER INSERTION    . SPINE SURGERY     cyst removed; [facility]    Family History  Problem Relation Age of Onset  . Breast cancer Maternal Aunt 40  . Cancer Mother        colon  . Aneurysm Father     Social History:  reports that she has been smoking cigarettes.  She has a 37.50 pack-year smoking history. she has never used smokeless tobacco. She reports that she does not drink alcohol or use drugs.  Allergies  Allergen Reactions  . Alprazolam Swelling   . Demerol [Meperidine] Itching  . Fentanyl Other (See Comments)    "burning and hot"    Medications:  I have reviewed the patient's current medications. Prior to Admission:  Medications Prior to Admission  Medication Sig Dispense Refill Last Dose  . albuterol (PROVENTIL HFA;VENTOLIN HFA) 108 (90 Base) MCG/ACT inhaler Inhale 2 puffs into the lungs every 6 (six) hours as needed for wheezing or shortness of breath.   prn at prn  . amLODipine (NORVASC) 10 MG tablet TAKE 1 TABLET BY MOUTH EVERY DAY 30 tablet 0 Unknown at Unknown  . benztropine (COGENTIN) 1 MG tablet Take 1 mg by mouth 2 (two) times daily.   Unknown at Unknown  . citalopram (CELEXA) 20 MG tablet Take 20 mg by mouth daily.    Unknown at Unknown  . docusate sodium (COLACE) 100 MG capsule TAKE 1 CAPSULE BY MOUTH EVERY DAY 10 capsule 0 Unknown at Unknown  . etodolac (LODINE) 200 MG capsule TAKE 1 CAPSULE BY MOUTH EVERY 8 HOURS  1 Unknown at Unknown  . fluticasone (FLONASE) 50 MCG/ACT nasal spray Place 1 spray into both nostrils daily. 16 g 12 Unknown at Unknown  . furosemide (LASIX) 20 MG tablet TAKE 1 TABLET BY MOUTH EVERY DAY FOR FLUID 90 tablet 0 Unknown at Unknown  . gabapentin (NEURONTIN)  300 MG capsule Take 300 mg by mouth 3 (three) times daily.    Unknown at Unknown  . LANTUS SOLOSTAR 100 UNIT/ML Solostar Pen Inject 20 Units into the skin daily. 15 mL 12 Unknown at Unknown  . levothyroxine (SYNTHROID, LEVOTHROID) 125 MCG tablet TAKE 1 TABLET BY MOUTH EVERY DAY 60 tablet 0 Unknown at Unknown  . losartan (COZAAR) 100 MG tablet Take 1 tablet (100 mg total) by mouth daily. 90 tablet 3 Unknown at Unknown  . metFORMIN (GLUCOPHAGE-XR) 500 MG 24 hr tablet Take 1,000 mg by mouth 2 (two) times daily.   Unknown at Unknown  . metoprolol tartrate (LOPRESSOR) 25 MG tablet Take 25 mg by mouth 2 (two) times daily.   Unknown at Unknown  . montelukast (SINGULAIR) 10 MG tablet Take 1 tablet (10 mg total) by mouth daily. 90 tablet 3 Unknown at  Unknown  . nitroGLYCERIN (NITRODUR - DOSED IN MG/24 HR) 0.2 mg/hr patch APPLY 1 PATCH ONTO THE SKIN ONCE DAILY 30 patch 0 Unknown at Unknown  . nitroGLYCERIN (NITROSTAT) 0.4 MG SL tablet Place 0.4 mg under the tongue every 5 (five) minutes as needed for chest pain.   prn at prn  . primidone (MYSOLINE) 50 MG tablet Take 50 mg by mouth 2 (two) times daily.  1 Unknown at Unknown  . ranitidine (ZANTAC) 150 MG tablet TAKE 1 TABLET (150 MG TOTAL) BY MOUTH 2 (TWO) TIMES DAILY. 60 tablet 0 Unknown at Unknown  . REXULTI 1 MG TABS Take 1 mg by mouth daily.   Unknown at Unknown  . rosuvastatin (CRESTOR) 20 MG tablet TAKE 1 TABLET BY MOUTH EVERY DAY AT BEDTIME 60 tablet 0 Unknown at Unknown  . traMADol (ULTRAM) 50 MG tablet Take 1 tablet (50 mg total) by mouth every 8 (eight) hours as needed. 30 tablet 0 prn at prn  . Vitamin D, Ergocalciferol, (DRISDOL) 50000 units CAPS capsule TAKE ONE CAPSULE BY MOUTH MONTHLY 11 capsule 0 Unknown at Unknown  . aspirin 81 MG EC tablet TAKE 1 TABLET (81 MG TOTAL) BY MOUTH DAILY. 100 tablet 2 Unknown at Unknown  . TRUETRACK TEST test strip USE TO CHECK BLOOD SUGAR LEVEL ONCE DAILY. DX CODE E11.65 100 each 2   . ULTICARE SHORT PEN NEEDLES 31G X 8 MM MISC    Taking   Scheduled: . docusate sodium  100 mg Oral BID  . heparin  5,000 Units Subcutaneous Q8H  . insulin aspart  0-9 Units Subcutaneous Q6H  . insulin glargine  8 Units Subcutaneous QHS    ROS: Unable to provide due to mental status  Physical Examination: Blood pressure (!) 126/53, pulse 70, resp. rate 18, weight 62 kg (136 lb 11 oz), SpO2 95 %.  HEENT-  Normocephalic, no lesions, without obvious abnormality.  Normal external eye and conjunctiva.  Normal TM's bilaterally.  Normal auditory canals and external ears. Normal external nose, mucus membranes and septum.  Normal pharynx. Cardiovascular- S1, S2 normal, pulses palpable throughout   Lungs- chest clear, no wheezing, rales, normal symmetric air  entry Abdomen- soft, non-tender; bowel sounds normal; no masses,  no organomegaly Extremities- no edema Lymph-no adenopathy palpable Musculoskeletal-no joint tenderness, deformity or swelling Skin-warm and dry, no hyperpigmentation, vitiligo, or suspicious lesions  Neurological Examination  (on Precedex) Mental Status: Obtunded.  Follows some simple commands.  No speech.   Cranial Nerves: II: Discs flat bilaterally; Pupils equal, round, reactive to light and accommodation III,IV, VI: intact oculocephalic maneuvers V,VII: corneals reduced bilaterally VIII: unable to test  IX,X: unable to test XI: unable to test XII: unable to test Motor: Moves all extremities in response to painful stimuli Sensory: Responds to painful stimuli throughout Deep Tendon Reflexes: 2+ and symmetric throughout Plantars: Right: upgoing   Left: upgoing Cerebellar: unable to test Gait: unable to test   Laboratory Studies:   Basic Metabolic Panel: Recent Labs  Lab 05/10/17 0315  NA 134*  K 3.2*  CL 100*  CO2 22  GLUCOSE 286*  BUN 23*  CREATININE 1.19*  CALCIUM 8.9    Liver Function Tests: Recent Labs  Lab 05/10/17 0315  AST 24  ALT 19  ALKPHOS 114  BILITOT 0.5  PROT 8.0  ALBUMIN 4.0   No results for input(s): LIPASE, AMYLASE in the last 168 hours. No results for input(s): AMMONIA in the last 168 hours.  CBC: Recent Labs  Lab 05/10/17 0243  WBC 10.1  HGB 13.6  HCT 40.8  MCV 89.4  PLT 306    Cardiac Enzymes: No results for input(s): CKTOTAL, CKMB, CKMBINDEX, TROPONINI in the last 168 hours.  BNP: Invalid input(s): POCBNP  CBG: Recent Labs  Lab 05/10/17 0234 05/10/17 0601 05/10/17 1138  GLUCAP 253* 316* 380*    Microbiology: Results for orders placed or performed during the hospital encounter of 05/10/17  MRSA PCR Screening     Status: None   Collection Time: 05/10/17  6:06 AM  Result Value Ref Range Status   MRSA by PCR NEGATIVE NEGATIVE Final    Comment:         The GeneXpert MRSA Assay (FDA approved for NASAL specimens only), is one component of a comprehensive MRSA colonization surveillance program. It is not intended to diagnose MRSA infection nor to guide or monitor treatment for MRSA infections. Performed at [facility], [address] [place], [place] [address]     Coagulation Studies: Recent Labs    05/10/17 0315  LABPROT 13.5  INR 1.04    Urinalysis: No results for input(s): COLORURINE, LABSPEC, PHURINE, GLUCOSEU, HGBUR, BILIRUBINUR, KETONESUR, PROTEINUR, UROBILINOGEN, NITRITE, LEUKOCYTESUR in the last 168 hours.  Invalid input(s): APPERANCEUR  Lipid Panel:  No results found for: CHOL, TRIG, HDL, CHOLHDL, VLDL, LDLCALC  HgbA1C:  Lab Results  Component Value Date   HGBA1C 8.3 (H) 03/29/2017    Urine Drug Screen:      Component Value Date/Time   LABOPIA NONE DETECTED 05/10/2017 0245   COCAINSCRNUR NONE DETECTED 05/10/2017 0245   LABBENZ NONE DETECTED 05/10/2017 0245   AMPHETMU NONE DETECTED 05/10/2017 0245   THCU NONE DETECTED 05/10/2017 0245   LABBARB POSITIVE (A) 05/10/2017 0245    Alcohol Level:  Recent Labs  Lab 05/10/17 0243  ETH <10    Other results: EKG: sinus tachycardia at 102 bpm.  Imaging: Ct Head Code Stroke Wo Contrast`  Result Date: 05/10/2017 CLINICAL DATA:  Code stroke. Initial evaluation for possible seizure, overdose. EXAM: CT HEAD WITHOUT CONTRAST TECHNIQUE: Contiguous axial images were obtained from the base of the skull through the vertex without intravenous contrast. COMPARISON:  None available. FINDINGS: Brain: Generalized age-related cerebral atrophy with mild chronic small vessel ischemic disease. No acute intracranial hemorrhage. No acute large vessel territory infarct. No mass lesion, midline shift, or mass effect. Ventricular prominence related to global parenchymal volume loss of hydrocephalus. No extra-axial fluid collection. Vascular: No hyperdense vessel. Calcified  atherosclerosis at the skull base. Skull: Scalp soft tissues and calvarium within normal limits. Sinuses/Orbits: Visualized paranasal sinuses are clear. No mastoid effusion. Other: Globes and orbital soft  tissues within normal limits. Patient status post cataract extraction on the left. ASPECTS [facility] Stroke Program Early CT Score) - Ganglionic level infarction (caudate, lentiform nuclei, internal capsule, insula, M1-M3 cortex): 7 - Supraganglionic infarction (M4-M6 cortex): 3 Total score (0-10 with 10 being normal): 10 IMPRESSION: 1. No acute intracranial infarct or other process identified. 2. ASPECTS is 10. 3. Age-related cerebral atrophy with mild chronic small vessel ischemic disease. Critical Value/emergent results were called by telephone at the time of interpretation on 05/10/2017 at 3:23 am to Dr. Bayard Males , who verbally acknowledged these results. Electronically Signed   By: Rise Mu M.D.   On: 05/10/2017 03:26     Assessment/Plan: 72 year old female presenting after being found down.  Unclear etiology.  Patient with seizure in ED as well.  Was found with a bottle of Tramadol  Unclear how much she ingested but this can lower seizure threshold.  Patient also on Primidone for tremor which if taken incorrectly may lower seizure threshold as well.  Head CT reviewed and shows no acute changes.  Further work up recommended.  Recommendations: 1.  EEG 2.  Once more cooperative patient to have MRI of the brain with and without contrast 3.  Seizure precautions 4.  Ativan prn seizure activity    Thana Farr, MD Neurology [phone] 05/10/2017, 12:05 PM

## 2017-05-10 NOTE — ED Notes (Signed)
Patient was keep head and gaze to left and patient began to seize. Patient placed on non-rebreather and given 1mg  of ativan.

## 2017-05-10 NOTE — Progress Notes (Signed)
RN notified Dr Sung Amabile that patient's BP is back up to 180 systolic but unable to give PRN meds because 10-20mg  hydralazine prn was discontinued.  MD gave order to reorder previous PRN hydralaline.

## 2017-05-10 NOTE — ED Notes (Signed)
ICU nurse unable to get report at this time

## 2017-05-10 NOTE — Progress Notes (Signed)
RN notified Dr Belia Heman that patient is agitated keep wanting to get out of bed pulling a things but not following command.  Patient also has order for MRI but has a pacemaker.  MD gave order to cancel MRI and to start precedex gtt.

## 2017-05-10 NOTE — H&P (Signed)
Melanie King is an 72 y.o. female.   Chief Complaint: Fall HPI: The patient with past medical history of diabetes, hypertension, hyperlipidemia, CAD and CHF (systolic) presents to the emergency department after being found unconscious face down in a pile of her medications.  The patient had open tramadol scattered around her.  Her friends who found her report that she has not seen a doctor for a number of months and cannot keep up with her medications.  They state it is unclear what she is supposed to be on but they have a list of everything found in her home that they will bring for reconciliation with pharmacy.  In the emergency department the patient had a generalized tonic-clonic seizure.  She was given Ativan which made her very somnolent.  Oxygen saturations dropped which prompted placement of nonrebreather mask.  At this time she is maintaining her respiratory effort and oxygen saturations albeit mouth breathing and requiring nonrebreather.  Once it was determined the patient did not need intubation the emergency department staff called the hospitalist service for admission.  Past Medical History:  Diagnosis Date  . Arthritis   . CHF (congestive heart failure) ([place])   . Coronary arteriosclerosis   . DDD (degenerative disc disease), cervical   . DDD (degenerative disc disease), lumbar   . Depression   . Diabetes mellitus without complication ([place])   . GERD (gastroesophageal reflux disease)   . Hyperlipidemia   . Hypertension   . Osteoarthritis   . Pacemaker   . Tremor     Past Surgical History:  Procedure Laterality Date  . ABDOMINAL HYSTERECTOMY    . BREAST BIOPSY Right 1994   neg cyst removed  . CHOLECYSTECTOMY    . EYE SURGERY  1964, 1966, and 1967   bilateral  . FOOT SURGERY Right    cellulitis  . PACEMAKER INSERTION    . SPINE SURGERY     cyst removed; [facility]    Family History  Problem Relation Age of Onset  . Breast cancer Maternal Aunt 93  . Cancer Mother         colon  . Aneurysm Father    Social History:  reports that she has been smoking cigarettes.  She has a 37.50 pack-year smoking history. she has never used smokeless tobacco. She reports that she does not drink alcohol or use drugs.  Allergies:  Allergies  Allergen Reactions  . Alprazolam Swelling  . Demerol [Meperidine] Itching  . Fentanyl Other (See Comments)    "burning and hot"    Medications Prior to Admission  Medication Sig Dispense Refill  . albuterol (PROVENTIL HFA;VENTOLIN HFA) 108 (90 Base) MCG/ACT inhaler Inhale 2 puffs into the lungs every 6 (six) hours as needed for wheezing or shortness of breath.    Marland Kitchen amLODipine (NORVASC) 10 MG tablet TAKE 1 TABLET BY MOUTH EVERY DAY 30 tablet 0  . benztropine (COGENTIN) 1 MG tablet Take 1 mg by mouth 2 (two) times daily.    . citalopram (CELEXA) 20 MG tablet Take 20 mg by mouth daily.     Marland Kitchen docusate sodium (COLACE) 100 MG capsule TAKE 1 CAPSULE BY MOUTH EVERY DAY 10 capsule 0  . etodolac (LODINE) 200 MG capsule TAKE 1 CAPSULE BY MOUTH EVERY 8 HOURS  1  . fluticasone (FLONASE) 50 MCG/ACT nasal spray Place 1 spray into both nostrils daily. 16 g 12  . furosemide (LASIX) 20 MG tablet TAKE 1 TABLET BY MOUTH EVERY DAY FOR FLUID 90 tablet  0  . gabapentin (NEURONTIN) 300 MG capsule Take 300 mg by mouth 3 (three) times daily.     Marland Kitchen LANTUS SOLOSTAR 100 UNIT/ML Solostar Pen Inject 20 Units into the skin daily. 15 mL 12  . levothyroxine (SYNTHROID, LEVOTHROID) 125 MCG tablet TAKE 1 TABLET BY MOUTH EVERY DAY 60 tablet 0  . losartan (COZAAR) 100 MG tablet Take 1 tablet (100 mg total) by mouth daily. 90 tablet 3  . metFORMIN (GLUCOPHAGE-XR) 500 MG 24 hr tablet Take 1,000 mg by mouth 2 (two) times daily.    . metoprolol tartrate (LOPRESSOR) 25 MG tablet Take 25 mg by mouth 2 (two) times daily.    . montelukast (SINGULAIR) 10 MG tablet Take 1 tablet (10 mg total) by mouth daily. 90 tablet 3  . nitroGLYCERIN (NITRODUR - DOSED IN MG/24 HR) 0.2 mg/hr  patch APPLY 1 PATCH ONTO THE SKIN ONCE DAILY 30 patch 0  . nitroGLYCERIN (NITROSTAT) 0.4 MG SL tablet Place 0.4 mg under the tongue every 5 (five) minutes as needed for chest pain.    . primidone (MYSOLINE) 50 MG tablet Take 50 mg by mouth 2 (two) times daily.  1  . ranitidine (ZANTAC) 150 MG tablet TAKE 1 TABLET (150 MG TOTAL) BY MOUTH 2 (TWO) TIMES DAILY. 60 tablet 0  . REXULTI 1 MG TABS Take 1 mg by mouth daily.    . rosuvastatin (CRESTOR) 20 MG tablet TAKE 1 TABLET BY MOUTH EVERY DAY AT BEDTIME 60 tablet 0  . traMADol (ULTRAM) 50 MG tablet Take 1 tablet (50 mg total) by mouth every 8 (eight) hours as needed. 30 tablet 0  . Vitamin D, Ergocalciferol, (DRISDOL) 50000 units CAPS capsule TAKE ONE CAPSULE BY MOUTH MONTHLY 11 capsule 0  . aspirin 81 MG EC tablet TAKE 1 TABLET (81 MG TOTAL) BY MOUTH DAILY. 100 tablet 2  . TRUETRACK TEST test strip USE TO CHECK BLOOD SUGAR LEVEL ONCE DAILY. DX CODE E11.65 100 each 2  . ULTICARE SHORT PEN NEEDLES 31G X 8 MM MISC       Results for orders placed or performed during the hospital encounter of 05/10/17 (from the past 48 hour(s))  Glucose, capillary     Status: Abnormal   Collection Time: 05/10/17  2:34 AM  Result Value Ref Range   Glucose-Capillary 253 (H) 65 - 99 mg/dL   Comment 1 Notify RN    Comment 2 Document in Chart   CBC     Status: Abnormal   Collection Time: 05/10/17  2:43 AM  Result Value Ref Range   WBC 10.1 3.6 - 11.0 K/uL   RBC 4.56 3.80 - 5.20 MIL/uL   Hemoglobin 13.6 12.0 - 16.0 g/dL   HCT 40.8 35.0 - 47.0 %   MCV 89.4 80.0 - 100.0 fL   MCH 29.9 26.0 - 34.0 pg   MCHC 33.4 32.0 - 36.0 g/dL   RDW 15.2 (H) 11.5 - 14.5 %   Platelets 306 150 - 440 K/uL    Comment: Performed at [facility], [address] [place], [place] [address]  Ethanol     Status: None   Collection Time: 05/10/17  2:43 AM  Result Value Ref Range   Alcohol, Ethyl (B) <10 <10 mg/dL    Comment:        LOWEST DETECTABLE LIMIT FOR SERUM ALCOHOL IS 10  mg/dL FOR MEDICAL PURPOSES ONLY Performed at [facility], [address], [place], [place] [address]   Urine Drug Screen, [facility]  only)     Status: Abnormal   Collection Time: 05/10/17  2:45 AM  Result Value Ref Range   Tricyclic, Ur Screen NONE DETECTED NONE DETECTED   Amphetamines, Ur Screen NONE DETECTED NONE DETECTED   MDMA (Ecstasy)Ur Screen NONE DETECTED NONE DETECTED   Cocaine Metabolite,Ur [place] NONE DETECTED NONE DETECTED   Opiate, Ur Screen NONE DETECTED NONE DETECTED   Phencyclidine (PCP) Ur S NONE DETECTED NONE DETECTED   Cannabinoid 50 Ng, Ur [place] NONE DETECTED NONE DETECTED   Barbiturates, Ur Screen POSITIVE (A) NONE DETECTED   Benzodiazepine, Ur Scrn NONE DETECTED NONE DETECTED   Methadone Scn, Ur NONE DETECTED NONE DETECTED    Comment: (NOTE) Tricyclics + metabolites, urine    Cutoff 1000 ng/mL Amphetamines + metabolites, urine  Cutoff 1000 ng/mL MDMA (Ecstasy), urine              Cutoff 500 ng/mL Cocaine Metabolite, urine          Cutoff 300 ng/mL Opiate + metabolites, urine        Cutoff 300 ng/mL Phencyclidine (PCP), urine         Cutoff 25 ng/mL Cannabinoid, urine                 Cutoff 50 ng/mL Barbiturates + metabolites, urine  Cutoff 200 ng/mL Benzodiazepine, urine              Cutoff 200 ng/mL Methadone, urine                   Cutoff 300 ng/mL The urine drug screen provides only a preliminary, unconfirmed analytical test result and should not be used for non-medical purposes. Clinical consideration and professional judgment should be applied to any positive drug screen result due to possible interfering substances. A more specific alternate chemical method must be used in order to obtain a confirmed analytical result. Gas chromatography / mass spectrometry (GC/MS) is the preferred confirmat ory method. Performed at [facility], [address] [place], [place] [address]   Protime-INR     Status: None   Collection Time:  05/10/17  3:15 AM  Result Value Ref Range   Prothrombin Time 13.5 11.4 - 15.2 seconds   INR 1.04     Comment: Performed at [facility], [address] [place], [place] [address]  Comprehensive metabolic panel     Status: Abnormal   Collection Time: 05/10/17  3:15 AM  Result Value Ref Range   Sodium 134 (L) 135 - 145 mmol/L   Potassium 3.2 (L) 3.5 - 5.1 mmol/L   Chloride 100 (L) 101 - 111 mmol/L   CO2 22 22 - 32 mmol/L   Glucose, Bld 286 (H) 65 - 99 mg/dL   BUN 23 (H) 6 - 20 mg/dL   Creatinine, Ser 1.19 (H) 0.44 - 1.00 mg/dL   Calcium 8.9 8.9 - 10.3 mg/dL   Total Protein 8.0 6.5 - 8.1 g/dL   Albumin 4.0 3.5 - 5.0 g/dL   AST 24 15 - 41 U/L   ALT 19 14 - 54 U/L   Alkaline Phosphatase 114 38 - 126 U/L   Total Bilirubin 0.5 0.3 - 1.2 mg/dL   GFR calc non Af Amer 45 (L) >60 mL/min   GFR calc Af Amer 52 (L) >60 mL/min    Comment: (NOTE) The eGFR has been calculated using the CKD EPI equation. This calculation has not been validated in all clinical situations. eGFR's persistently <60 mL/min signify possible Chronic  Kidney Disease.    Anion gap 12 5 - 15    Comment: Performed at [facility], [address] [place], [place] [address]  TSH     Status: Abnormal   Collection Time: 05/10/17  3:15 AM  Result Value Ref Range   TSH 39.968 (H) 0.350 - 4.500 uIU/mL    Comment: Performed by a 3rd Generation assay with a functional sensitivity of <=0.01 uIU/mL. Performed at [facility], [address] [place], [place] [address]   Glucose, capillary     Status: Abnormal   Collection Time: 05/10/17  6:01 AM  Result Value Ref Range   Glucose-Capillary 316 (H) 65 - 99 mg/dL   Ct Head Code Stroke Wo Contrast`  Result Date: 05/10/2017 CLINICAL DATA:  Code stroke. Initial evaluation for possible seizure, overdose. EXAM: CT HEAD WITHOUT CONTRAST TECHNIQUE: Contiguous axial images were obtained from the base of the skull through the vertex without intravenous  contrast. COMPARISON:  None available. FINDINGS: Brain: Generalized age-related cerebral atrophy with mild chronic small vessel ischemic disease. No acute intracranial hemorrhage. No acute large vessel territory infarct. No mass lesion, midline shift, or mass effect. Ventricular prominence related to global parenchymal volume loss of hydrocephalus. No extra-axial fluid collection. Vascular: No hyperdense vessel. Calcified atherosclerosis at the skull base. Skull: Scalp soft tissues and calvarium within normal limits. Sinuses/Orbits: Visualized paranasal sinuses are clear. No mastoid effusion. Other: Globes and orbital soft tissues within normal limits. Patient status post cataract extraction on the left. ASPECTS [facility] Stroke Program Early CT Score) - Ganglionic level infarction (caudate, lentiform nuclei, internal capsule, insula, M1-M3 cortex): 7 - Supraganglionic infarction (M4-M6 cortex): 3 Total score (0-10 with 10 being normal): 10 IMPRESSION: 1. No acute intracranial infarct or other process identified. 2. ASPECTS is 10. 3. Age-related cerebral atrophy with mild chronic small vessel ischemic disease. Critical Value/emergent results were called by telephone at the time of interpretation on 05/10/2017 at 3:23 am to Dr. Marjean Donna , who verbally acknowledged these results. Electronically Signed   By: Jeannine Boga M.D.   On: 05/10/2017 03:26    Review of Systems  Unable to perform ROS: Critical illness    Blood pressure (!) 179/74, pulse 73, resp. rate 17, SpO2 100 %. Physical Exam  Vitals reviewed. Constitutional: She is oriented to person, place, and time. She appears well-developed and well-nourished. No distress.  HENT:  Head: Normocephalic and atraumatic.  Mouth/Throat: Oropharynx is clear and moist.  Eyes: Conjunctivae and EOM are normal. Pupils are equal, round, and reactive to light. No scleral icterus.  Neck: Normal range of motion. Neck supple. No JVD present. No tracheal  deviation present. No thyromegaly present.  Cardiovascular: Normal rate, regular rhythm and normal heart sounds. Exam reveals no gallop and no friction rub.  No murmur heard. Respiratory: Effort normal and breath sounds normal.  GI: Soft. Bowel sounds are normal. She exhibits no distension. There is no tenderness.  Genitourinary:  Genitourinary Comments: Deferred  Musculoskeletal: Normal range of motion. She exhibits no edema.  Lymphadenopathy:    She has no cervical adenopathy.  Neurological: She is alert and oriented to person, place, and time. No cranial nerve deficit. She exhibits normal muscle tone.  Skin: Skin is warm and dry. No rash noted. No erythema.  Psychiatric:  Cannot assess mental status as the patient is somnolent secondary to antiepileptic medications.     Assessment/Plan This is a 72 year old female admitted for seizure. 1.  Seizure: Seizure threshold potentially lowered by tramadol  although it is unclear how much of this the patient ingested.  Notably the patient takes primidone at home.  She has been loaded with Keppra and given Ativan.  She has not seized since her second dose of Ativan.  Currently she is protecting her airway we have been able to wean her to oxygen via nasal cannula at 2 L/min.  Consult neurology for further guidance 2.  CAD: Stable.  Continue aspirin 3.  Hypertension: Hold amlodipine when the patient is n.p.o.  If she requires as needed blood pressure medications will administer something IV. 4.  Diabetes mellitus type 2: Continue basal insulin even while patient is n.p.o.  Sliding scale insulin while hospitalized as well.  Hold oral hypoglycemic agents 5.  DVT prophylaxis: Heparin 6.  GI prophylaxis: None The patient is a full code.  Time spent on admission orders and critical care approximately 45 minutes.  Discussed with [facility] telemedicine  Harrie Foreman, MD 05/10/2017, 7:42 AM

## 2017-05-10 NOTE — ED Triage Notes (Signed)
Pt arrives via ems from home with reports of a fall. Ems reports pt was in prone position on floor when they arrived pt was unable to state how she got there, or what happened. Ems also report pt had pills laying all around her on the floor and was concerned she may have taken too much medication. EMS unable to report what medications where around the pt. Upon arrival to ed pt is able to state that she took 3 ultram. Pt is currently unable to answer questions all questions appropriately, unable to hold arms at 45 degree angle, head currently fixed to the left side.

## 2017-05-11 DIAGNOSIS — R410 Disorientation, unspecified: Secondary | ICD-10-CM

## 2017-05-11 LAB — GLUCOSE, CAPILLARY
Glucose-Capillary: 210 mg/dL — ABNORMAL HIGH (ref 65–99)
Glucose-Capillary: 226 mg/dL — ABNORMAL HIGH (ref 65–99)
Glucose-Capillary: 232 mg/dL — ABNORMAL HIGH (ref 65–99)
Glucose-Capillary: 234 mg/dL — ABNORMAL HIGH (ref 65–99)

## 2017-05-11 LAB — BASIC METABOLIC PANEL
ANION GAP: 8 (ref 5–15)
BUN: 12 mg/dL (ref 6–20)
CO2: 22 mmol/L (ref 22–32)
Calcium: 9.1 mg/dL (ref 8.9–10.3)
Chloride: 110 mmol/L (ref 101–111)
Creatinine, Ser: 0.97 mg/dL (ref 0.44–1.00)
GFR calc Af Amer: >60 mL/min (ref >60)
GFR, EST NON AFRICAN AMERICAN: 57 mL/min — AB (ref >60)
Glucose, Bld: 218 mg/dL — ABNORMAL HIGH (ref 65–99)
POTASSIUM: 5 mmol/L (ref 3.5–5.1)
SODIUM: 140 mmol/L (ref 135–145)

## 2017-05-11 LAB — PHOSPHORUS: PHOSPHORUS: 2.6 mg/dL (ref 2.5–4.6)

## 2017-05-11 LAB — MAGNESIUM: MAGNESIUM: 1.6 mg/dL — AB (ref 1.7–2.4)

## 2017-05-11 MED ORDER — MAGNESIUM SULFATE 2 GM/50ML IV SOLN
2.0000 g | Freq: Once | INTRAVENOUS | Status: AC
  Administered 2017-05-11: 2 g via INTRAVENOUS
  Filled 2017-05-11: qty 50

## 2017-05-11 MED ORDER — DEXTROSE-NACL 5-0.45 % IV SOLN: INTRAVENOUS | Status: DC

## 2017-05-11 MED ORDER — LACTATED RINGERS IV SOLN
INTRAVENOUS | Status: DC
  Administered 2017-05-11 – 2017-05-14 (×4): via INTRAVENOUS

## 2017-05-11 MED ORDER — CHLORHEXIDINE GLUCONATE 0.12 % MT SOLN
15.0000 mL | Freq: Two times a day (BID) | OROMUCOSAL | Status: DC
  Administered 2017-05-11 – 2017-05-21 (×19): 15 mL via OROMUCOSAL
  Filled 2017-05-11 (×14): qty 15

## 2017-05-11 MED ORDER — ORAL CARE MOUTH RINSE
15.0000 mL | Freq: Two times a day (BID) | OROMUCOSAL | Status: DC
  Administered 2017-05-11 – 2017-05-12 (×2): 15 mL via OROMUCOSAL

## 2017-05-11 MED ORDER — INSULIN GLARGINE 100 UNIT/ML ~~LOC~~ SOLN
10.0000 [IU] | Freq: Every day | SUBCUTANEOUS | Status: DC
  Administered 2017-05-11 – 2017-05-16 (×5): 10 [IU] via SUBCUTANEOUS
  Filled 2017-05-11 (×7): qty 0.1

## 2017-05-11 MED ORDER — LORAZEPAM 2 MG/ML IJ SOLN
0.5000 mg | INTRAMUSCULAR | Status: DC | PRN
  Administered 2017-05-11 (×2): 1 mg via INTRAVENOUS
  Administered 2017-05-12: 0.5 mg via INTRAVENOUS
  Administered 2017-05-12: 1 mg via INTRAVENOUS
  Administered 2017-05-14: 0.5 mg via INTRAVENOUS
  Administered 2017-05-15 – 2017-05-18 (×8): 1 mg via INTRAVENOUS
  Filled 2017-05-11 (×15): qty 1

## 2017-05-11 MED ORDER — ORAL CARE MOUTH RINSE
15.0000 mL | Freq: Two times a day (BID) | OROMUCOSAL | Status: DC
  Administered 2017-05-11 – 2017-05-21 (×18): 15 mL via OROMUCOSAL

## 2017-05-11 NOTE — Progress Notes (Signed)
Patient agitated- increased precedex to 1.2 mcg/kg/hr. Dr Lonn Noelle has added ativan. Changing fluids to LR. Dr Thad Ranger assessed patient and will be talking to [facility] regarding plan of care.

## 2017-05-11 NOTE — Progress Notes (Signed)
Subjective: Patient placed on Precedex due to agitation.  Cries and moans often.    Objective: Current vital signs: BP 127/60   Pulse 74   Temp 98.3 F (36.8 C)   Resp (!) 24   Wt 62.2 kg (137 lb 2 oz)   LMP  (LMP Unknown)   SpO2 99%   BMI 25.91 kg/m  Vital signs in last 24 hours: Temp:  [97.6 F (36.4 C)-98.3 F (36.8 C)] 98.3 F (36.8 C) (03/08 0700) Pulse Rate:  [70-81] 74 (03/08 1000) Resp:  [13-37] 24 (03/08 1000) BP: (101-184)/(43-81) 127/60 (03/08 1000) SpO2:  [96 %-99 %] 99 % (03/08 1000) Weight:  [62.2 kg (137 lb 2 oz)] 62.2 kg (137 lb 2 oz) (03/08 0341)  Intake/Output from previous day: 03/07 0701 - 03/08 0700 In: 1321.3 [I.V.:1216.3; IV Piggyback:105] Out: 350 [Urine:350] Intake/Output this shift: No intake/output data recorded. Nutritional status: Diet NPO time specified Except for: Sips with Meds Seizure precautions Aspiration precautions  Neurologic Exam: Mental Status: Obtunded.  Does not follow commands.  No speech.  Moans when agitated Cranial Nerves: II: Discs flat bilaterally; Pupils equal, round, reactive to light and accommodation III,IV, VI: intact oculocephalic maneuvers V,VII: corneals reduced bilaterally VIII: unable to test IX,X: unable to test XI: unable to test XII: unable to test Motor: Moves all extremities in response to painful stimuli Sensory: Responds to painful stimuli throughout   Lab Results: Basic Metabolic Panel: Recent Labs  Lab 05/10/17 0315 05/11/17 0626  NA 134* 140  K 3.2* 5.0  CL 100* 110  CO2 22 22  GLUCOSE 286* 218*  BUN 23* 12  CREATININE 1.19* 0.97  CALCIUM 8.9 9.1  MG  --  1.6*  PHOS  --  2.6    Liver Function Tests: Recent Labs  Lab 05/10/17 0315  AST 24  ALT 19  ALKPHOS 114  BILITOT 0.5  PROT 8.0  ALBUMIN 4.0   No results for input(s): LIPASE, AMYLASE in the last 168 hours. No results for input(s): AMMONIA in the last 168 hours.  CBC: Recent Labs  Lab 05/10/17 0243  WBC 10.1   HGB 13.6  HCT 40.8  MCV 89.4  PLT 306    Cardiac Enzymes: No results for input(s): CKTOTAL, CKMB, CKMBINDEX, TROPONINI in the last 168 hours.  Lipid Panel: No results for input(s): CHOL, TRIG, HDL, CHOLHDL, VLDL, LDLCALC in the last 168 hours.  CBG: Recent Labs  Lab 05/10/17 0601 05/10/17 1138 05/10/17 1726 05/11/17 0022 05/11/17 0608  GLUCAP 316* 380* 215* 226* 210*    Microbiology: Results for orders placed or performed during the hospital encounter of 05/10/17  MRSA PCR Screening     Status: None   Collection Time: 05/10/17  6:06 AM  Result Value Ref Range Status   MRSA by PCR NEGATIVE NEGATIVE Final    Comment:        The GeneXpert MRSA Assay (FDA approved for NASAL specimens only), is one component of a comprehensive MRSA colonization surveillance program. It is not intended to diagnose MRSA infection nor to guide or monitor treatment for MRSA infections. Performed at [facility], [address] [place], [place] [address]     Coagulation Studies: Recent Labs    05/10/17 0315  LABPROT 13.5  INR 1.04    Imaging: Ct Head Code Stroke Wo Contrast`  Result Date: 05/10/2017 CLINICAL DATA:  Code stroke. Initial evaluation for possible seizure, overdose. EXAM: CT HEAD WITHOUT CONTRAST TECHNIQUE: Contiguous axial images were obtained from the base  of the skull through the vertex without intravenous contrast. COMPARISON:  None available. FINDINGS: Brain: Generalized age-related cerebral atrophy with mild chronic small vessel ischemic disease. No acute intracranial hemorrhage. No acute large vessel territory infarct. No mass lesion, midline shift, or mass effect. Ventricular prominence related to global parenchymal volume loss of hydrocephalus. No extra-axial fluid collection. Vascular: No hyperdense vessel. Calcified atherosclerosis at the skull base. Skull: Scalp soft tissues and calvarium within normal limits. Sinuses/Orbits: Visualized paranasal sinuses  are clear. No mastoid effusion. Other: Globes and orbital soft tissues within normal limits. Patient status post cataract extraction on the left. ASPECTS [facility] Stroke Program Early CT Score) - Ganglionic level infarction (caudate, lentiform nuclei, internal capsule, insula, M1-M3 cortex): 7 - Supraganglionic infarction (M4-M6 cortex): 3 Total score (0-10 with 10 being normal): 10 IMPRESSION: 1. No acute intracranial infarct or other process identified. 2. ASPECTS is 10. 3. Age-related cerebral atrophy with mild chronic small vessel ischemic disease. Critical Value/emergent results were called by telephone at the time of interpretation on 05/10/2017 at 3:23 am to Dr. Bayard Males , who verbally acknowledged these results. Electronically Signed   By: Rise Mu M.D.   On: 05/10/2017 03:26    Medications:  I have reviewed the patient's current medications. Scheduled: . docusate sodium  100 mg Oral BID  . heparin  5,000 Units Subcutaneous Q8H  . insulin aspart  0-9 Units Subcutaneous Q6H  . insulin glargine  8 Units Subcutaneous QHS    Assessment/Plan: Patient remains altered. On Precedex.  No further seizures noted. EEG shows no epileptiform discharges.  MRI pending.     LOS: 1 day   Thana Farr, MD Neurology [phone] 05/11/2017  11:04 AM

## 2017-05-11 NOTE — Progress Notes (Signed)
Per Dr. Lonn Ciarah orders entered for psych. Consult and 1 on 1 sitter. Notified charge nurse and nursing tech is at patients bedside.

## 2017-05-11 NOTE — Progress Notes (Signed)
Inpatient Diabetes Program Recommendations  AACE/ADA: New Consensus Statement on Inpatient Glycemic Control (2015)  Target Ranges:  Prepandial:   less than 140 mg/dL      Peak postprandial:   less than 180 mg/dL (1-2 hours)      Critically ill patients:  140 - 180 mg/dL   Results for [place], [place] F (MRN [id number]) as of 05/11/2017 10:57  Ref. Range 05/10/2017 02:34 05/10/2017 06:01 05/10/2017 11:38 05/10/2017 17:26  Glucose-Capillary Latest Ref Range: 65 - 99 mg/dL 111 (H) 735 (H) 670 (H)  9 units Novolog  215 (H)  3 units Novolog +  8 units Lantus at 10pm   Results for [place], [place] F (MRN [id number]) as of 05/11/2017 10:57  Ref. Range 05/11/2017 00:22 05/11/2017 06:08  Glucose-Capillary Latest Ref Range: 65 - 99 mg/dL 438 (H)  3 units Novolog  210 (H)  3 units Novolog     Home DM Meds: Lantus 20 units daily       Metformin 1000 mg BID  Current Orders: Lantus 8 units QHS       Novolog Sensitive Correction Scale/ SSI (0-9 units) Q6 hours      Note patient remains NPO.  Still disoriented per RN assessment.  CBGs remain >200 mg/dl since midnight.    MD- Please consider the following in-hospital insulin adjustments:  1. Increase Lantus slightly to 12 units QHS  2. Change Novolog SSI coverage to Q4 hours while NPO (currently ordered Q6 hours and Novolog likely only lasting 4 hours between doses)    --Will follow patient during hospitalization--  Ambrose Finland RN, MSN, CDE Diabetes Coordinator Inpatient Glycemic Control Team Team Pager: [phone] (8a-5p)

## 2017-05-11 NOTE — Progress Notes (Signed)
[facility] Progess Note    SYNOPSIS   72 yo female admitted with acute encephalopathy and acute hypoxic respiratory failure likely secondary to seizure activity and possible drug overdose although CVA possible     ASSESSMENT/PLAN   Acute encephalopathylikelysecondary toseizure activity. CT scan of the head was negative for acute stroke, appreciate neurology input, status post EEG, presently on Keppra  Acute hypoxic respiratory failure. Patient's respiratory status is stable this morning on nasal cannula   Hypokalemia. Being replaced  Hypomagnesemia. Being replaced  Hypertension    INTAKE / OUTPUT:  Intake/Output Summary (Last 24 hours) at 05/11/2017 1159 Last data filed at 05/11/2017 0600 Gross per 24 hour  Intake 1321.33 ml  Output 300 ml  Net 1021.33 ml    Name: Melanie King MRN: [id number] DOB: 06-Aug-1945    ADMISSION DATE:  05/10/2017   SUBJECTIVE:   Patient has been awake, alert, sometimes screaming and encephalopathic not responding appropriately. Neurology consulted, pending recommendations. CT scan of the head did not show any clear intracranial pathology other than age-related atrophy   VITAL SIGNS: Temp:  [97.6 F (36.4 C)-98.3 F (36.8 C)] 98.3 F (36.8 C) (03/08 0700) Pulse Rate:  [70-81] 74 (03/08 1000) Resp:  [13-37] 24 (03/08 1000) BP: (101-187)/(43-81) 187/72 (03/08 1106) SpO2:  [96 %-99 %] 99 % (03/08 1000) Weight:  [62.2 kg (137 lb 2 oz)] 62.2 kg (137 lb 2 oz) (03/08 0341)  PHYSICAL EXAMINATION: Physical Examination:   VS: BP (!) 187/72   Pulse 74   Temp 98.3 F (36.8 C)   Resp (!) 24   Wt 62.2 kg (137 lb 2 oz)   LMP  (LMP Unknown)   SpO2 99%   BMI 25.91 kg/m   General Appearance: confused, encephalopathic Neuro:Limited exam, moves all extremities, no clear seizure activity noted HEENT: PERRLA, EOM intact. Pulmonary: normal breath sounds   CardiovascularNormal S1,S2.  No m/r/g.   Abdomen: Benign, Soft  exam Skin:   warm, no rashes, no ecchymosis  Extremities: normal, no cyanosis, clubbing.    LABORATORY PANEL:   CBC Recent Labs  Lab 05/10/17 0243  WBC 10.1  HGB 13.6  HCT 40.8  PLT 306    Chemistries  Recent Labs  Lab 05/10/17 0315 05/11/17 0626  NA 134* 140  K 3.2* 5.0  CL 100* 110  CO2 22 22  GLUCOSE 286* 218*  BUN 23* 12  CREATININE 1.19* 0.97  CALCIUM 8.9 9.1  MG  --  1.6*  PHOS  --  2.6  AST 24  --   ALT 19  --   ALKPHOS 114  --   BILITOT 0.5  --     Recent Labs  Lab 05/10/17 0234 05/10/17 0601 05/10/17 1138 05/10/17 1726 05/11/17 0022 05/11/17 0608  GLUCAP 253* 316* 380* 215* 226* 210*   No results for input(s): PHART, PCO2ART, PO2ART in the last 168 hours. Recent Labs  Lab 05/10/17 0315  AST 24  ALT 19  ALKPHOS 114  BILITOT 0.5  ALBUMIN 4.0    Cardiac Enzymes No results for input(s): TROPONINI in the last 168 hours.  RADIOLOGY:  Ct Head Code Stroke Wo Contrast`  Result Date: 05/10/2017 CLINICAL DATA:  Code stroke. Initial evaluation for possible seizure, overdose. EXAM: CT HEAD WITHOUT CONTRAST TECHNIQUE: Contiguous axial images were obtained from the base of the skull through the vertex without intravenous contrast. COMPARISON:  None available. FINDINGS: Brain: Generalized age-related cerebral atrophy with mild chronic small vessel ischemic disease. No acute  intracranial hemorrhage. No acute large vessel territory infarct. No mass lesion, midline shift, or mass effect. Ventricular prominence related to global parenchymal volume loss of hydrocephalus. No extra-axial fluid collection. Vascular: No hyperdense vessel. Calcified atherosclerosis at the skull base. Skull: Scalp soft tissues and calvarium within normal limits. Sinuses/Orbits: Visualized paranasal sinuses are clear. No mastoid effusion. Other: Globes and orbital soft tissues within normal limits. Patient status post cataract extraction on the left. ASPECTS [facility] Stroke Program Early  CT Score) - Ganglionic level infarction (caudate, lentiform nuclei, internal capsule, insula, M1-M3 cortex): 7 - Supraganglionic infarction (M4-M6 cortex): 3 Total score (0-10 with 10 being normal): 10 IMPRESSION: 1. No acute intracranial infarct or other process identified. 2. ASPECTS is 10. 3. Age-related cerebral atrophy with mild chronic small vessel ischemic disease. Critical Value/emergent results were called by telephone at the time of interpretation on 05/10/2017 at 3:23 am to Dr. Bayard Males , who verbally acknowledged these results. Electronically Signed   By: Rise Mu M.D.   On: 05/10/2017 03:26    Tora Kindred, DO  05/11/2017

## 2017-05-11 NOTE — Care Management Note (Signed)
Case Management Note  Patient Details  Name: Melanie King MRN: [id number] Date of Birth: 12-Apr-1945  Subjective/Objective:                  RNCM contacted by patient's nephew Bryn Gulling in regards to "placement of patient".  He states that patient moved to [place] address and "her family is over 5 hours away".  She lives with a female friend and Molly Maduro states that the house is unkept with bed bugs and they are not managing well on their own.  He states he is her HCPOA and next of kin. I do not see documentation of this information on file.  He wants her placed in a facility close to [place]- near family.  He states she has never been diagnosed with Dementia. He states she has attempted suicide multiple times.  He has notified staff of this potential attempt to overdose. Patient has Medicare/Medicaid on file  but her life insurance has lapsed.  Action/Plan: RNCM explained difference in Medicare and Medicaid. RNCM also explained patient must agree as we cannot force a discharge disposition on patient especially if she has capacity to make health care decisions.  RNCM suggested that he and family look at long-term care facilities and introduce patient and possibly her female friend to see if they will comply.  Patient cannot currently make a health care decision as she is in ICU and somewhat agitated. PT evaluation would be beneficial when patient can participate. CSW consult placed. Patient may also benefit from psych consult when appropriate.  RNCM spoke with listed PCP Marletta Lor NP [phone]. She is not familiar with nephew Molly Maduro.  Per Lisbeth Ply is who is listed at [facility] office as next of kin as he always goes with patient to appointments. He is listed as her boyfriend per Raynelle Fanning. Raynelle Fanning states she has some mental diagnosis but not dementia per office notes.   RNCM team will follow.  Expected Discharge Date:                  Expected Discharge Plan:     In-House Referral:   Clinical Social Work  Discharge planning Services  CM Consult  Post Acute Care Choice:    Choice offered to:  [facility] POA / Guardian  DME Arranged:    DME Agency:     HH Arranged:    HH Agency:     Status of Service:  In process, will continue to follow  If discussed at Long Length of Stay Meetings, dates discussed:    Additional Comments:  Collie Siad, RN 05/11/2017, 3:26 PM

## 2017-05-11 NOTE — Progress Notes (Signed)
Patient ID: Melanie King, female   DOB: [birth year], 72 y.o.   MRN: [id number]   [employer] PROGRESS NOTE  Melanie King WUJ:[id number] DOB: 03-05-1946 DOA: 05/10/2017 PCP: Marletta Lor, NP  HPI/Subjective: Patient is alert and able to squeeze my hands.  Unable to communicate much with her.  Hard to understand what she is trying to say.  Seems like she is just mumbling.  Patient able to squeeze my hands bilaterally.  Patient placed on Precedex for agitation yesterday.   Objective: Vitals:   05/11/17 1400 05/11/17 1430  BP: 140/60 137/76  Pulse: 70 75  Resp: 15 19  Temp:    SpO2: 97% 98%    Filed Weights   05/10/17 0900 05/11/17 0341  Weight: 62 kg (136 lb 11 oz) 62.2 kg (137 lb 2 oz)    ROS: Review of Systems  Unable to perform ROS: Acuity of condition   Exam: Physical Exam  Constitutional: She appears lethargic.  HENT:  Nose: No mucosal edema.  Mouth/Throat: No oropharyngeal exudate.  Eyes: Conjunctivae and lids are normal. Pupils are equal, round, and reactive to light.  Neck: Carotid bruit is not present. No thyromegaly present.  Respiratory: She has no decreased breath sounds. She has no wheezes. She has no rhonchi. She has no rales.  GI: Soft. Bowel sounds are normal. There is no tenderness.  Musculoskeletal:       Right ankle: She exhibits no swelling.       Left ankle: She exhibits no swelling.  Lymphadenopathy:    She has no cervical adenopathy.  Neurological: She appears lethargic.  Skin: Skin is dry. No rash noted. No cyanosis.  Psychiatric: She is agitated.      Data Reviewed: Basic Metabolic Panel: Recent Labs  Lab 05/10/17 0315 05/11/17 0626  NA 134* 140  K 3.2* 5.0  CL 100* 110  CO2 22 22  GLUCOSE 286* 218*  BUN 23* 12  CREATININE 1.19* 0.97  CALCIUM 8.9 9.1  MG  --  1.6*  PHOS  --  2.6   Liver Function Tests: Recent Labs  Lab 05/10/17 0315  AST 24  ALT 19  ALKPHOS 114  BILITOT 0.5  PROT 8.0  ALBUMIN 4.0   CBC: Recent  Labs  Lab 05/10/17 0243  WBC 10.1  HGB 13.6  HCT 40.8  MCV 89.4  PLT 306    CBG: Recent Labs  Lab 05/10/17 1138 05/10/17 1726 05/11/17 0022 05/11/17 0608 05/11/17 1303  GLUCAP 380* 215* 226* 210* 234*    Recent Results (from the past 240 hour(s))  MRSA PCR Screening     Status: None   Collection Time: 05/10/17  6:06 AM  Result Value Ref Range Status   MRSA by PCR NEGATIVE NEGATIVE Final    Comment:        The GeneXpert MRSA Assay (FDA approved for NASAL specimens only), is one component of a comprehensive MRSA colonization surveillance program. It is not intended to diagnose MRSA infection nor to guide or monitor treatment for MRSA infections. Performed at [facility], [address] [place], [place] [address]      Studies: Ct Head Code Stroke Wo Contrast`  Result Date: 05/10/2017 CLINICAL DATA:  Code stroke. Initial evaluation for possible seizure, overdose. EXAM: CT HEAD WITHOUT CONTRAST TECHNIQUE: Contiguous axial images were obtained from the base of the skull through the vertex without intravenous contrast. COMPARISON:  None available. FINDINGS: Brain: Generalized age-related cerebral atrophy with mild chronic small vessel ischemic disease.  No acute intracranial hemorrhage. No acute large vessel territory infarct. No mass lesion, midline shift, or mass effect. Ventricular prominence related to global parenchymal volume loss of hydrocephalus. No extra-axial fluid collection. Vascular: No hyperdense vessel. Calcified atherosclerosis at the skull base. Skull: Scalp soft tissues and calvarium within normal limits. Sinuses/Orbits: Visualized paranasal sinuses are clear. No mastoid effusion. Other: Globes and orbital soft tissues within normal limits. Patient status post cataract extraction on the left. ASPECTS [facility] Stroke Program Early CT Score) - Ganglionic level infarction (caudate, lentiform nuclei, internal capsule, insula, M1-M3 cortex): 7 -  Supraganglionic infarction (M4-M6 cortex): 3 Total score (0-10 with 10 being normal): 10 IMPRESSION: 1. No acute intracranial infarct or other process identified. 2. ASPECTS is 10. 3. Age-related cerebral atrophy with mild chronic small vessel ischemic disease. Critical Value/emergent results were called by telephone at the time of interpretation on 05/10/2017 at 3:23 am to Dr. Bayard Males , who verbally acknowledged these results. Electronically Signed   By: Rise Mu M.D.   On: 05/10/2017 03:26    Scheduled Meds: . docusate sodium  100 mg Oral BID  . heparin  5,000 Units Subcutaneous Q8H  . insulin aspart  0-9 Units Subcutaneous Q6H  . insulin glargine  10 Units Subcutaneous QHS   Continuous Infusions: . dexmedetomidine (PRECEDEX) IV infusion 1 mcg/kg/hr (05/11/17 1441)  . lactated ringers 75 mL/hr at 05/11/17 1037  . levETIRAcetam (KEPPRA) IVPB Stopped (05/11/17 1134)    Assessment/Plan:  1. Seizure.  Patient had tramadol which can lessen the threshold for seizure.  Patient placed on Keppra.  As per neurology, EEG showed no elective form discharges.  MRI when able to do so. 2. Agitation patient placed on Precedex drip. 3. Hypothyroidism unspecified consider IV levothyroxine while n.p.o. with TSH of 39.9 4. History of CAD. 5. Relative hypotension on IV fluids.  Antihypertensive medications on hold 6. Type 2 diabetes mellitus on sliding scale and low-dose glargine insulin.   Code Status:     Code Status Orders  (From admission, onward)        Start     Ordered   05/10/17 0603  Full code  Continuous     05/10/17 0602    Code Status History    Date Active Date Inactive Code Status Order ID Comments User Context   03/28/2017 22:02 03/30/2017 21:57 Full Code [id number]  Oralia Manis, MD Inpatient     Family Communication: Renato Battles at the bedside Disposition Plan: To be determined  Consultants:  Critical care specialist  Neurology  Time spent: 28 minutes,  case discussed with nursing staff  Alford Highland  [employer]

## 2017-05-12 DIAGNOSIS — G40409 Other generalized epilepsy and epileptic syndromes, not intractable, without status epilepticus: Principal | ICD-10-CM

## 2017-05-12 DIAGNOSIS — R410 Disorientation, unspecified: Secondary | ICD-10-CM

## 2017-05-12 LAB — GLUCOSE, CAPILLARY
Glucose-Capillary: 138 mg/dL — ABNORMAL HIGH (ref 65–99)
Glucose-Capillary: 162 mg/dL — ABNORMAL HIGH (ref 65–99)
Glucose-Capillary: 163 mg/dL — ABNORMAL HIGH (ref 65–99)
Glucose-Capillary: 185 mg/dL — ABNORMAL HIGH (ref 65–99)
Glucose-Capillary: 99 mg/dL (ref 65–99)

## 2017-05-12 MED ORDER — LORAZEPAM 2 MG/ML IJ SOLN
1.0000 mg | Freq: Once | INTRAMUSCULAR | Status: AC
  Administered 2017-05-12: 1 mg via INTRAVENOUS

## 2017-05-12 NOTE — Clinical Social Work Note (Signed)
CSW received consult for possible placement. CSW will assess when able to determine what level of care is appropriate. PT eval is pending.  Argentina Ponder, MSW, Theresia Majors [phone]

## 2017-05-12 NOTE — Progress Notes (Signed)
[employer] - [place] at [facility]   PATIENT NAME: Melanie King    MR#:  [id number]  DATE OF BIRTH:  [birth year]  SUBJECTIVE:   Patient here due to altered mental status/encephalopathy secondary to seizures. Patient developed some agitation and therefore was on Precedex drip. Patient's mumbling but follows simple commands. No other acute events overnight. Remains on a Precedex drip  REVIEW OF SYSTEMS:    Review of Systems  Unable to perform ROS: Mental acuity    Nutrition: NPO Tolerating Diet: No Tolerating PT: Await Eval.    DRUG ALLERGIES:   Allergies  Allergen Reactions  . Alprazolam Swelling  . Demerol [Meperidine] Itching  . Fentanyl Other (See Comments)    "burning and hot"    VITALS:  Blood pressure (!) 109/96, pulse 74, temperature 97.9 F (36.6 C), resp. rate (!) 24, weight 62.2 kg (137 lb 2 oz), SpO2 99 %.  PHYSICAL EXAMINATION:   Physical Exam  GENERAL:  72 y.o.-year-old patient lying in bed Lethargic/Encephalopathic.  EYES: Pupils equal, round, reactive to light. No scleral icterus. Extraocular muscles intact.  HEENT: Head atraumatic, normocephalic. Oropharynx and nasopharynx clear.  NECK:  Supple, no jugular venous distention. No thyroid enlargement, no tenderness.  LUNGS: Normal breath sounds bilaterally, no wheezing, rales, rhonchi. No use of accessory muscles of respiration.  CARDIOVASCULAR: S1, S2 normal. No murmurs, rubs, or gallops.  ABDOMEN: Soft, nontender, nondistended. Bowel sounds present. No organomegaly or mass.  EXTREMITIES: No cyanosis, clubbing or edema b/l.    NEUROLOGIC: Cranial nerves II through XII are intact. No focal Motor or sensory deficits b/l. Globally weak and follows simple commands.   PSYCHIATRIC: The patient is alert and oriented x 1.  SKIN: No obvious rash, lesion, or ulcer.    LABORATORY PANEL:   CBC Recent Labs  Lab 05/10/17 0243  WBC 10.1  HGB 13.6  HCT 40.8  PLT 306    ------------------------------------------------------------------------------------------------------------------  Chemistries  Recent Labs  Lab 05/10/17 0315 05/11/17 0626  NA 134* 140  K 3.2* 5.0  CL 100* 110  CO2 22 22  GLUCOSE 286* 218*  BUN 23* 12  CREATININE 1.19* 0.97  CALCIUM 8.9 9.1  MG  --  1.6*  AST 24  --   ALT 19  --   ALKPHOS 114  --   BILITOT 0.5  --    ------------------------------------------------------------------------------------------------------------------  Cardiac Enzymes No results for input(s): TROPONINI in the last 168 hours. ------------------------------------------------------------------------------------------------------------------  RADIOLOGY:  No results found.   ASSESSMENT AND PLAN:   72 year old female past medical history of hypertension, hyperlipidemia, GERD, diabetes, depression, history of CHF, osteoarthritis, status post pacemaker who presented to the hospital due to altered mental status.  1. Altered mental status-etiology unclear but suspected to be secondary to seizures with post ictal delirium. -Patient developed significant agitation and therefore remains on a Precedex drip.  2. Seizures-suspected to be secondary to patient taking tramadol which can lower seizure threshold. -continue Keppra, appreciate nephrology input and EEG negative for seizures. MRI of the brain pending but unclear if this can be done if patient has a pacemaker.  3. Diabetes type 2 without compensation-patient is currently nothing by mouth. Continue low-dose Lantus, sliding scale insulin.  4. Hypomagnesemia- will replace and repeat in a.m.    All the records are reviewed and case discussed with Care Management/Social Worker. Management plans discussed with the patient, family and they are in agreement.  CODE STATUS: Full code  DVT Prophylaxis: Hep SQ  TOTAL TIME TAKING CARE  OF THIS PATIENT: 30 minutes.   POSSIBLE D/C IN 3-4 DAYS, DEPENDING  ON CLINICAL CONDITION.   Houston Siren M.D on 05/12/2017 at 1:48 PM  Between 7am to 6pm - Pager - [phone]  After 6pm go to [email or web address] - Social research officer, government  [facility] [place]  Office  [phone]  CC: Primary care physician; Marletta Lor, NP

## 2017-05-12 NOTE — Progress Notes (Signed)
Subjective: Precedex at 0.6 No further seizures.  EEG no abnormalites.  Objective: Current vital signs: BP 132/73   Pulse 71   Temp 97.9 F (36.6 C)   Resp 18   Wt 137 lb 2 oz (62.2 kg)   LMP  (LMP Unknown)   SpO2 98%   BMI 25.91 kg/m  Vital signs in last 24 hours: Temp:  [97.7 F (36.5 C)-97.9 F (36.6 C)] 97.9 F (36.6 C) (03/09 0800) Pulse Rate:  [70-74] 71 (03/09 1400) Resp:  [6-28] 18 (03/09 1400) BP: (93-168)/(48-96) 132/73 (03/09 1400) SpO2:  [96 %-99 %] 98 % (03/09 1400)  Intake/Output from previous day: 03/08 0701 - 03/09 0700 In: 1745.5 [I.V.:1745.5] Out: 600 [Urine:600] Intake/Output this shift: No intake/output data recorded. Nutritional status: Diet NPO time specified Except for: Sips with Meds Seizure precautions Aspiration precautions  Neurologic Exam: Mental Status: More awake. Looks around the room, but does not clearly follow commands.  Cranial Nerves: II: Discs flat bilaterally; Pupils equal, round, reactive to light and accommodation III,IV, VI: intact oculocephalic maneuvers V,VII: corneals reduced bilaterally VIII: unable to test IX,X: unable to test XI: unable to test XII: unable to test Motor: Moves all extremities in response to painful stimuli Sensory: Responds to painful stimuli throughout   Lab Results: Basic Metabolic Panel: Recent Labs  Lab 05/10/17 0315 05/11/17 0626  NA 134* 140  K 3.2* 5.0  CL 100* 110  CO2 22 22  GLUCOSE 286* 218*  BUN 23* 12  CREATININE 1.19* 0.97  CALCIUM 8.9 9.1  MG  --  1.6*  PHOS  --  2.6    Liver Function Tests: Recent Labs  Lab 05/10/17 0315  AST 24  ALT 19  ALKPHOS 114  BILITOT 0.5  PROT 8.0  ALBUMIN 4.0   No results for input(s): LIPASE, AMYLASE in the last 168 hours. No results for input(s): AMMONIA in the last 168 hours.  CBC: Recent Labs  Lab 05/10/17 0243  WBC 10.1  HGB 13.6  HCT 40.8  MCV 89.4  PLT 306    Cardiac Enzymes: No results for input(s): CKTOTAL,  CKMB, CKMBINDEX, TROPONINI in the last 168 hours.  Lipid Panel: No results for input(s): CHOL, TRIG, HDL, CHOLHDL, VLDL, LDLCALC in the last 168 hours.  CBG: Recent Labs  Lab 05/11/17 1303 05/11/17 1634 05/12/17 0029 05/12/17 0525 05/12/17 1207  GLUCAP 234* 232* 163* 138* 162*    Microbiology: Results for orders placed or performed during the hospital encounter of 05/10/17  MRSA PCR Screening     Status: None   Collection Time: 05/10/17  6:06 AM  Result Value Ref Range Status   MRSA by PCR NEGATIVE NEGATIVE Final    Comment:        The GeneXpert MRSA Assay (FDA approved for NASAL specimens only), is one component of a comprehensive MRSA colonization surveillance program. It is not intended to diagnose MRSA infection nor to guide or monitor treatment for MRSA infections. Performed at [facility], [address] [place], [place] [address]     Coagulation Studies: Recent Labs    05/10/17 0315  LABPROT 13.5  INR 1.04    Imaging: No results found.  Medications:  I have reviewed the patient's current medications. Scheduled: . chlorhexidine  15 mL Mouth Rinse BID  . docusate sodium  100 mg Oral BID  . heparin  5,000 Units Subcutaneous Q8H  . insulin aspart  0-9 Units Subcutaneous Q6H  . insulin glargine  10 Units Subcutaneous QHS  .  mouth rinse  15 mL Mouth Rinse q12n4p  . mouth rinse  15 mL Mouth Rinse q12n4p    Assessment/Plan: Patient remains altered. On Precedex at 0.6 .  No further seizures noted. EEG shows no epileptiform discharges.  MRI pending but not urgent as will not change management at this time.     LOS: 2 days

## 2017-05-12 NOTE — Consult Note (Signed)
[facility] Consult   Reason for Consult: Consult for 72 year old woman who was brought into the hospital with altered mental status and then had a seizure.  Question apparently arose about possible suicidality. Referring Physician: Verdell Carmine Patient Identification: Melanie King MRN:  [id number] Principal Diagnosis: Acute delirium Diagnosis:   Patient Active Problem List   Diagnosis Date Noted  . Acute delirium [R41.0] 05/12/2017  . Seizure ([place]) [R56.9] 05/10/2017  . Elevated sedimentation rate [R70.0] 04/04/2017  . Elevated C-reactive protein (CRP) [R79.82] 04/04/2017  . Chest pain, unspecified [R07.9] 04/02/2017  . Bilateral lower extremity pain (Secondary Area of Pain) (R>L) [R44.315, M79.605] 04/02/2017  . Chronic hip pain, bilateral  (Secondary Area of Pain) (R>L) [M25.551, M25.552, G89.29] 04/02/2017  . Chronic bilateral low back pain with bilateral sciatica [facility] Area of Pain) (R>L) [M54.42, M54.41, G89.29] 04/02/2017  . Chronic pain syndrome [G89.4] 04/02/2017  . Disorder of bone, unspecified [M89.9] 04/02/2017  . Other long term (current) drug therapy [Z79.899] 04/02/2017  . Other specified health status [Z78.9] 04/02/2017  . Long term current use of opiate analgesic [Z79.891] 04/02/2017  . Hip pain, bilateral [M25.551, M25.552] 12/06/2016  . Tremor [R25.1] 05/27/2016  . Trigger finger of left hand [M65.30] 02/16/2016  . Osteoarthritis of both hands [M19.041, M19.042] 02/16/2016  . Angina pectoris ([place]) [I20.9] 01/17/2016  . COPD (chronic obstructive pulmonary disease) ([place]) [J44.9] 01/17/2016  . CAD (coronary artery disease) [I25.10] 01/17/2016  . HLD (hyperlipidemia) [E78.5] 01/17/2016  . Hypertension [I10] 01/17/2016  . Sick sinus syndrome ([place]) [I49.5] 01/17/2016  . Chronic pain [G89.29] 01/17/2016  . Personal history of tobacco use, presenting hazards to health [Z87.891] 11/16/2015  . Diabetes ([place]) [E11.9] 05/12/2013    Total Time spent with  patient: 1 hour  Subjective:   Melanie F Eble is a 72 y.o. female patient admitted with the patient not able to give any information.  HPI: Chart reviewed.  Reviewed current notes as well as going through a fair bit of the old chart here and some of her outpatient treatment notes.  Patient is currently on Precedex and unconscious and not able to give any information.  Her live-in boyfriend was in the room with her and he gave information that the patient had been preparing to take her nighttime medicine and in his words "she got mixed up".  He said that he went to bed and that sometime later he heard her yelling and found her on the floor and then called for an ambulance.  In the emergency room patient was observed to have a seizure.  Now in the ICU.  The boyfriend denies there having been any signs of depression or any statements about suicidality recently or any change in her normal routine.  I spoke with her nephew who says that he is the healthcare power of attorney.  He is expressing suspicion of this live-in boyfriend saying he has reason to think this man has been exploiting his aunt.  Social history: Patient has apparently been living with this live-in boyfriend for a while.  The nephew claims that she has told him that this man is taking advantage of her.  Nephew appears to be her closest living relative she has no children of her own.  Substance abuse history: Nothing in the chart or history about any substance abuse problems  Medical history: Patient has a history of a benign tremor being treated with Cogentin and primidone  Past Psychiatric History: Looking through the records that we have you do not  see very much specific about psychiatric history.  Patient was on Cogentin which had initially been 1/2 mg twice a day and then increased to 1 mg twice a day recently.  This was prescribed by her neurologist as a treatment for her benign tremor.  Her medication reconciliation says that she is  now on Rexulti although I cannot find when that was started or what exactly the rationale was.  Celexa is also listed although again it is hard to find any specific description of the symptoms it was targeting.  Nephew says the patient has a history of multiple suicide attempts but the most recent one was 10 years ago.  Risk to Self: Is patient at risk for suicide?: No Risk to Others:   Prior Inpatient Therapy:   Prior Outpatient Therapy:    Past Medical History:  Past Medical History:  Diagnosis Date  . Arthritis   . CHF (congestive heart failure) ([place])   . Coronary arteriosclerosis   . DDD (degenerative disc disease), cervical   . DDD (degenerative disc disease), lumbar   . Depression   . Diabetes mellitus without complication ([place])   . GERD (gastroesophageal reflux disease)   . Hyperlipidemia   . Hypertension   . Osteoarthritis   . Pacemaker   . Tremor     Past Surgical History:  Procedure Laterality Date  . ABDOMINAL HYSTERECTOMY    . BREAST BIOPSY Right 1994   neg cyst removed  . CHOLECYSTECTOMY    . EYE SURGERY  1964, 1966, and 1967   bilateral  . FOOT SURGERY Right    cellulitis  . PACEMAKER INSERTION    . SPINE SURGERY     cyst removed; [facility]   Family History:  Family History  Problem Relation Age of Onset  . Breast cancer Maternal Aunt 54  . Cancer Mother        colon  . Aneurysm Father    Family Psychiatric  History: Unknown Social History:  Social History   Substance and Sexual Activity  Alcohol Use No     Social History   Substance and Sexual Activity  Drug Use No    Social History   Socioeconomic History  . Marital status: Single    Spouse name: Not on file  . Number of children: Not on file  . Years of education: Not on file  . Highest education level: Not on file  Social Needs  . Financial resource strain: Not on file  . Food insecurity - worry: Not on file  . Food insecurity - inability: Not on file  . Transportation needs -  medical: Not on file  . Transportation needs - non-medical: Not on file  Occupational History  . Not on file  Tobacco Use  . Smoking status: Current Every Day Smoker    Packs/day: 0.75    Years: 50.00    Pack years: 37.50    Types: Cigarettes  . Smokeless tobacco: Never Used  Substance and Sexual Activity  . Alcohol use: No  . Drug use: No  . Sexual activity: No  Other Topics Concern  . Not on file  Social History Narrative  . Not on file   Additional Social History:    Allergies:   Allergies  Allergen Reactions  . Alprazolam Swelling  . Demerol [Meperidine] Itching  . Fentanyl Other (See Comments)    "burning and hot"    Labs:  Results for orders placed or performed during the hospital encounter of 05/10/17 (from  the past 48 hour(s))  Glucose, capillary     Status: Abnormal   Collection Time: 05/10/17  5:26 PM  Result Value Ref Range   Glucose-Capillary 215 (H) 65 - 99 mg/dL  Glucose, capillary     Status: Abnormal   Collection Time: 05/11/17 12:22 AM  Result Value Ref Range   Glucose-Capillary 226 (H) 65 - 99 mg/dL   Comment 1 Notify RN    Comment 2 Document in Chart   Glucose, capillary     Status: Abnormal   Collection Time: 05/11/17  6:08 AM  Result Value Ref Range   Glucose-Capillary 210 (H) 65 - 99 mg/dL  Basic metabolic panel     Status: Abnormal   Collection Time: 05/11/17  6:26 AM  Result Value Ref Range   Sodium 140 135 - 145 mmol/L   Potassium 5.0 3.5 - 5.1 mmol/L   Chloride 110 101 - 111 mmol/L   CO2 22 22 - 32 mmol/L   Glucose, Bld 218 (H) 65 - 99 mg/dL   BUN 12 6 - 20 mg/dL   Creatinine, Ser 0.97 0.44 - 1.00 mg/dL   Calcium 9.1 8.9 - 10.3 mg/dL   GFR calc non Af Amer 57 (L) >60 mL/min   GFR calc Af Amer >60 >60 mL/min    Comment: (NOTE) The eGFR has been calculated using the CKD EPI equation. This calculation has not been validated in all clinical situations. eGFR's persistently <60 mL/min signify possible Chronic Kidney Disease.     Anion gap 8 5 - 15    Comment: Performed at [facility], [address] [place], [place] [address]  Magnesium     Status: Abnormal   Collection Time: 05/11/17  6:26 AM  Result Value Ref Range   Magnesium 1.6 (L) 1.7 - 2.4 mg/dL    Comment: Performed at [facility], [address] [place], [place] [address]  Phosphorus     Status: None   Collection Time: 05/11/17  6:26 AM  Result Value Ref Range   Phosphorus 2.6 2.5 - 4.6 mg/dL    Comment: Performed at [facility], [address] [place], [place] [address]  Glucose, capillary     Status: Abnormal   Collection Time: 05/11/17  1:03 PM  Result Value Ref Range   Glucose-Capillary 234 (H) 65 - 99 mg/dL  Glucose, capillary     Status: Abnormal   Collection Time: 05/11/17  4:34 PM  Result Value Ref Range   Glucose-Capillary 232 (H) 65 - 99 mg/dL  Glucose, capillary     Status: Abnormal   Collection Time: 05/12/17 12:29 AM  Result Value Ref Range   Glucose-Capillary 163 (H) 65 - 99 mg/dL   Comment 1 Notify RN    Comment 2 Document in Chart   Glucose, capillary     Status: Abnormal   Collection Time: 05/12/17  5:25 AM  Result Value Ref Range   Glucose-Capillary 138 (H) 65 - 99 mg/dL   Comment 1 Notify RN    Comment 2 Document in Chart   Glucose, capillary     Status: Abnormal   Collection Time: 05/12/17 12:07 PM  Result Value Ref Range   Glucose-Capillary 162 (H) 65 - 99 mg/dL    Current Facility-Administered Medications  Medication Dose Route Frequency Provider Last Rate Last Dose  . acetaminophen (TYLENOL) tablet 650 mg  650 mg Oral Q6H PRN Harrie Foreman, MD       Or  . acetaminophen (TYLENOL) suppository  650 mg  650 mg Rectal Q6H PRN Harrie Foreman, MD      . chlorhexidine (PERIDEX) 0.12 % solution 15 mL  15 mL Mouth Rinse BID Conforti, Graciemae Delisle, DO   15 mL at 05/12/17 0917  . dexmedetomidine (PRECEDEX) 400 MCG/100ML (4 mcg/mL) infusion  0.4-1.2 mcg/kg/hr Intravenous Titrated Flora Lipps,  MD 12.4 mL/hr at 05/12/17 1442 0.8 mcg/kg/hr at 05/12/17 1442  . docusate sodium (COLACE) capsule 100 mg  100 mg Oral BID Harrie Foreman, MD      . heparin injection 5,000 Units  5,000 Units Subcutaneous Q8H Harrie Foreman, MD   5,000 Units at 05/12/17 1329  . hydrALAZINE (APRESOLINE) injection 10-20 mg  10-20 mg Intravenous Q4H PRN Wilhelmina Mcardle, MD   10 mg at 05/12/17 0542  . insulin aspart (novoLOG) injection 0-9 Units  0-9 Units Subcutaneous Q6H Harrie Foreman, MD   2 Units at 05/12/17 1328  . insulin glargine (LANTUS) injection 10 Units  10 Units Subcutaneous QHS Conforti, Jeshawn Melucci, DO   10 Units at 05/11/17 2111  . lactated ringers infusion   Intravenous Continuous Conforti, Rakan Soffer, DO 75 mL/hr at 05/12/17 0600    . levETIRAcetam (KEPPRA) 500 mg in sodium chloride 0.9 % 100 mL IVPB  500 mg Intravenous Q12H Loletha Grayer, MD   Stopped at 05/12/17 0900  . LORazepam (ATIVAN) injection 0.5-1 mg  0.5-1 mg Intravenous Q4H PRN Loletha Grayer, MD   1 mg at 05/12/17 1241  . MEDLINE mouth rinse  15 mL Mouth Rinse q12n4p Conforti, Dorse Locy, DO   15 mL at 05/12/17 1155  . MEDLINE mouth rinse  15 mL Mouth Rinse q12n4p Conforti, Lollie Gunner, DO   15 mL at 05/12/17 1330  . ondansetron (ZOFRAN) tablet 4 mg  4 mg Oral Q6H PRN Harrie Foreman, MD       Or  . ondansetron [facility]) injection 4 mg  4 mg Intravenous Q6H PRN Harrie Foreman, MD        Musculoskeletal: Strength & Muscle Tone: decreased Gait & Station: unable to stand Patient leans: N/A  Psychiatric Specialty Exam: Physical Exam  Nursing note and vitals reviewed. Constitutional: She appears well-developed and well-nourished.  HENT:  Head: Normocephalic and atraumatic.  Eyes: Conjunctivae are normal. Pupils are equal, round, and reactive to light.  Neck: Normal range of motion.  Cardiovascular: Regular rhythm and normal heart sounds.  Respiratory: Effort normal. No respiratory distress.  GI: Soft.  Musculoskeletal: Normal range  of motion.  Skin: Skin is warm and dry.  Psychiatric: Her affect is blunt. She is noncommunicative.    Review of Systems  Unable to perform ROS: Mental status change    Blood pressure 132/73, pulse 71, temperature 97.9 F (36.6 C), resp. rate 18, weight 62.2 kg (137 lb 2 oz), SpO2 98 %.Body mass index is 25.91 kg/m.  General Appearance: Casual  Eye Contact:  Negative  Speech:  Negative  Volume:  Decreased  Mood:  Negative  Affect:  Negative  Thought Process:  NA  Orientation:  Negative  Thought Content:  Negative  Suicidal Thoughts:  No  Homicidal Thoughts:  No  Memory:  Negative  Judgement:  Negative  Insight:  Negative  Psychomotor Activity:  Negative  Concentration:  Concentration: Negative  Recall:  Negative  Fund of Knowledge:  Negative  Language:  Negative  Akathisia:  Negative  Handed:  Right  AIMS (if indicated):     Assets:  Housing  ADL's:  Impaired  Cognition:  Impaired,  Severe  Sleep:        Treatment Plan Summary: Daily contact with patient to assess and evaluate symptoms and progress in treatment, Medication management and Plan 72 year old woman who presented to the hospital with altered mental status and seizure.  Unknown etiology.  We do not have enough information at this point to know if there was any reason to think this was a intentional overdose or other intentional self-harm event.  We have conflicting information from different people about the reliability of the recent history.  Under the circumstances I agree with nursing to leave the commitment in place so that we continue to have a one-on-one sitter with the patient.  I am not going to add or start any psychiatric medicine at this point.  I will follow up while she is in the hospital.  Disposition: See note above  Alethia Berthold, MD 05/12/2017 4:32 PM

## 2017-05-12 NOTE — Progress Notes (Signed)
  [facility] Critical Care Medicine Progess Note    SYNOPSIS   72 yo female admitted with acute encephalopathy and acute hypoxic respiratory failure likely secondary to seizure activity and possible drug overdose although CVA possible     ASSESSMENT/PLAN   Acute encephalopathylikelysecondary toseizure activity. CT scan of the head was negative for acute stroke, appreciate neurology input, status post EEG, no further episode of seizure, pending MRI, presently on Keppra  Acute hypoxic respiratory failure. Patient's respiratory status is stable this morning on nasal cannula    Hypomagnesemia. Will recheck BMP  Hypertension    INTAKE / OUTPUT:  Intake/Output Summary (Last 24 hours) at 05/12/2017 0816 Last data filed at 05/12/2017 0600 Gross per 24 hour  Intake 1745.48 ml  Output 600 ml  Net 1145.48 ml    Name: Melanie King MRN: [id number] DOB: 01/29/1946    ADMISSION DATE:  05/10/2017   SUBJECTIVE:   Patient has been awake, alert, sometimes screaming and encephalopathic not responding appropriately. Neurology consulted, pending recommendations. CT scan of the head did not show any clear intracranial pathology other than age-related atrophy   VITAL SIGNS: Temp:  [97.7 F (36.5 C)-97.9 F (36.6 C)] 97.8 F (36.6 C) (03/09 0542) Pulse Rate:  [70-75] 70 (03/09 0600) Resp:  [6-28] 10 (03/09 0600) BP: (91-194)/(48-91) 132/51 (03/09 0600) SpO2:  [96 %-99 %] 98 % (03/09 0600)  PHYSICAL EXAMINATION: Physical Examination:   VS: BP (!) 132/51   Pulse 70   Temp 97.8 F (36.6 C) (Oral)   Resp 10   Wt 62.2 kg (137 lb 2 oz)   LMP  (LMP Unknown)   SpO2 98%   BMI 25.91 kg/m   General Appearance: confused, encephalopathic Neuro:Limited exam, moves all extremities, no clear seizure activity noted HEENT: PERRLA, EOM intact. Pulmonary: normal breath sounds   CardiovascularNormal S1,S2.  No m/r/g.   Abdomen: Benign, Soft exam Skin:   warm, no rashes, no ecchymosis    Extremities: normal, no cyanosis, clubbing.    LABORATORY PANEL:   CBC Recent Labs  Lab 05/10/17 0243  WBC 10.1  HGB 13.6  HCT 40.8  PLT 306    Chemistries  Recent Labs  Lab 05/10/17 0315 05/11/17 0626  NA 134* 140  K 3.2* 5.0  CL 100* 110  CO2 22 22  GLUCOSE 286* 218*  BUN 23* 12  CREATININE 1.19* 0.97  CALCIUM 8.9 9.1  MG  --  1.6*  PHOS  --  2.6  AST 24  --   ALT 19  --   ALKPHOS 114  --   BILITOT 0.5  --     Recent Labs  Lab 05/11/17 0022 05/11/17 0608 05/11/17 1303 05/11/17 1634 05/12/17 0029 05/12/17 0525  GLUCAP 226* 210* 234* 232* 163* 138*   No results for input(s): PHART, PCO2ART, PO2ART in the last 168 hours. Recent Labs  Lab 05/10/17 0315  AST 24  ALT 19  ALKPHOS 114  BILITOT 0.5  ALBUMIN 4.0    Cardiac Enzymes No results for input(s): TROPONINI in the last 168 hours.  RADIOLOGY:  No results found.  Tora Kindred, DO  05/12/2017

## 2017-05-13 ENCOUNTER — Inpatient Hospital Stay: Payer: Medicare Other

## 2017-05-13 LAB — BASIC METABOLIC PANEL
Anion gap: 11 (ref 5–15)
BUN: 9 mg/dL (ref 6–20)
CALCIUM: 8.9 mg/dL (ref 8.9–10.3)
CO2: 22 mmol/L (ref 22–32)
CREATININE: 0.88 mg/dL (ref 0.44–1.00)
Chloride: 107 mmol/L (ref 101–111)
GFR calc Af Amer: >60 mL/min (ref >60)
Glucose, Bld: 132 mg/dL — ABNORMAL HIGH (ref 65–99)
POTASSIUM: 3.3 mmol/L — AB (ref 3.5–5.1)
SODIUM: 140 mmol/L (ref 135–145)

## 2017-05-13 LAB — GLUCOSE, CAPILLARY
Glucose-Capillary: 133 mg/dL — ABNORMAL HIGH (ref 65–99)
Glucose-Capillary: 122 mg/dL — ABNORMAL HIGH (ref 65–99)
Glucose-Capillary: 92 mg/dL (ref 65–99)
Glucose-Capillary: 90 mg/dL (ref 65–99)
Glucose-Capillary: 87 mg/dL (ref 65–99)

## 2017-05-13 LAB — MAGNESIUM: MAGNESIUM: 1.4 mg/dL — AB (ref 1.7–2.4)

## 2017-05-13 LAB — PHOSPHORUS: PHOSPHORUS: 4 mg/dL (ref 2.5–4.6)

## 2017-05-13 MED ORDER — BISACODYL 10 MG RE SUPP
10.0000 mg | Freq: Once | RECTAL | Status: AC
  Administered 2017-05-13: 10 mg via RECTAL
  Filled 2017-05-13: qty 1

## 2017-05-13 MED ORDER — MORPHINE SULFATE (PF) 2 MG/ML IV SOLN
2.0000 mg | INTRAVENOUS | Status: DC | PRN
  Administered 2017-05-13 – 2017-05-14 (×4): 4 mg via INTRAVENOUS
  Administered 2017-05-14: 2 mg via INTRAVENOUS
  Administered 2017-05-15 (×3): 4 mg via INTRAVENOUS
  Administered 2017-05-16 – 2017-05-17 (×5): 2 mg via INTRAVENOUS
  Filled 2017-05-13: qty 1
  Filled 2017-05-13 (×3): qty 2
  Filled 2017-05-13: qty 1
  Filled 2017-05-13: qty 2
  Filled 2017-05-13: qty 1
  Filled 2017-05-13: qty 2
  Filled 2017-05-13 (×2): qty 1
  Filled 2017-05-13 (×2): qty 2
  Filled 2017-05-13: qty 1

## 2017-05-13 MED ORDER — MAGNESIUM SULFATE 4 GM/100ML IV SOLN
4.0000 g | Freq: Once | INTRAVENOUS | Status: AC
  Administered 2017-05-13: 4 g via INTRAVENOUS
  Filled 2017-05-13: qty 100

## 2017-05-13 MED ORDER — MORPHINE SULFATE (PF) 2 MG/ML IV SOLN
INTRAVENOUS | Status: AC
  Filled 2017-05-13: qty 1

## 2017-05-13 MED ORDER — POTASSIUM CHLORIDE 10 MEQ/100ML IV SOLN
10.0000 meq | INTRAVENOUS | Status: AC
  Administered 2017-05-13 (×3): 10 meq via INTRAVENOUS
  Filled 2017-05-13 (×3): qty 100

## 2017-05-13 MED ORDER — MORPHINE SULFATE (PF) 2 MG/ML IV SOLN
2.0000 mg | INTRAVENOUS | Status: DC | PRN
  Administered 2017-05-13: 2 mg via INTRAVENOUS

## 2017-05-13 NOTE — Progress Notes (Signed)
[facility] Critical Care Medicine Progess Note    SYNOPSIS   72 yo female admitted with acute encephalopathy and acute hypoxic respiratory failure likely secondary to seizure activity and possible drug overdose.    ASSESSMENT/PLAN   Acute encephalopathylikelysecondary toseizure activity. CT scan of the head was negative for acute stroke, appreciate neurology input, presently on Keppra.,  Pending MRI when stabilizes.  Acute hypoxic respiratory failure. Patient's respiratory status is stable this morning on nasal cannula   Hypomagnesemia. Will replace.   Hypertension  Hypokalemia.  Will replace  Possible suicidal ideation.  Appreciate psychiatry's input and assistance in management.  We will follow all recommendations.     INTAKE / OUTPUT:  Intake/Output Summary (Last 24 hours) at 05/13/2017 1008 Last data filed at 05/13/2017 0800 Gross per 24 hour  Intake 2311.82 ml  Output 950 ml  Net 1361.82 ml    Name: Melanie King MRN: [id number] DOB: [birth year]    ADMISSION DATE:  05/10/2017   SUBJECTIVE:   Patient has been awake, alert, sometimes screaming and encephalopathic not responding appropriately. Neurology consulted, pending recommendations. CT scan of the head did not show any clear intracranial pathology other than age-related atrophy   VITAL SIGNS: Temp:  [97.8 F (36.6 C)-99.1 F (37.3 C)] 99.1 F (37.3 C) (03/10 0800) Pulse Rate:  [70-76] 70 (03/10 0900) Resp:  [0-24] 15 (03/10 0900) BP: (109-193)/(46-103) 163/65 (03/10 0800) SpO2:  [90 %-99 %] 95 % (03/10 0900)  PHYSICAL EXAMINATION: Physical Examination:   VS: BP (!) 163/65 (BP Location: Right Arm)   Pulse 70   Temp 99.1 F (37.3 C) (Oral)   Resp 15   Wt 62.2 kg (137 lb 2 oz)   LMP  (LMP Unknown)   SpO2 95%   BMI 25.91 kg/m   General Appearance: confused, encephalopathic Neuro:Limited exam, moves all extremities, no clear seizure activity noted HEENT: PERRLA, EOM intact. Pulmonary:  normal breath sounds   CardiovascularNormal S1,S2.  No m/r/g.   Abdomen: Benign, Soft exam Skin:   warm, no rashes, no ecchymosis  Extremities: normal, no cyanosis, clubbing.    LABORATORY PANEL:   CBC Recent Labs  Lab 05/10/17 0243  WBC 10.1  HGB 13.6  HCT 40.8  PLT 306    Chemistries  Recent Labs  Lab 05/10/17 0315  05/13/17 0518  NA 134*   < > 140  K 3.2*   < > 3.3*  CL 100*   < > 107  CO2 22   < > 22  GLUCOSE 286*   < > 132*  BUN 23*   < > 9  CREATININE 1.19*   < > 0.88  CALCIUM 8.9   < > 8.9  MG  --    < > 1.4*  PHOS  --    < > 4.0  AST 24  --   --   ALT 19  --   --   ALKPHOS 114  --   --   BILITOT 0.5  --   --    < > = values in this interval not displayed.    Recent Labs  Lab 05/12/17 0029 05/12/17 0525 05/12/17 1207 05/12/17 1743 05/12/17 2357 05/13/17 0658  GLUCAP 163* 138* 162* 99 185* 133*   No results for input(s): PHART, PCO2ART, PO2ART in the last 168 hours. Recent Labs  Lab 05/10/17 0315  AST 24  ALT 19  ALKPHOS 114  BILITOT 0.5  ALBUMIN 4.0    Cardiac Enzymes No results for input(s): TROPONINI  in the last 168 hours.  RADIOLOGY:  Dg Abd 1 View  Result Date: 05/13/2017 CLINICAL DATA:  Abdominal distention EXAM: ABDOMEN - 1 VIEW COMPARISON:  04/02/2017 FINDINGS: Mild gaseous distention of centralized bowel loops including the transverse colon and several small bowel loops. Moderate to large stool burden in the right colon. Prior cholecystectomy. No free air organomegaly. No suspicious calcification. IMPRESSION: Mild gaseous distention of centralized bowel loops including transverse colon and small bowel loops. This could reflect focal ileus. Moderate stool burden. Electronically Signed   By: Charlett Nose M.D.   On: 05/13/2017 07:27    Tora Kindred, DO  05/13/2017

## 2017-05-13 NOTE — Progress Notes (Signed)
CBG=87.  Kathline Magic, CCU NP notified.  Order received to hold lantus insulin.

## 2017-05-13 NOTE — Progress Notes (Signed)
[employer] - [place] at [facility]   PATIENT NAME: Melanie King    MR#:  [id number]  DATE OF BIRTH:  Oct 04, 1945  SUBJECTIVE:   Patient here due to altered mental status/encephalopathy secondary to seizures. Patient developed some agitation and therefore was on Precedex drip. Patient's mumbling but follows simple commands. No other acute events overnight. Remains on a Precedex drip  REVIEW OF SYSTEMS:    Review of Systems  Unable to perform ROS: Mental acuity    Nutrition: NPO Tolerating Diet: No Tolerating PT: Await Eval.    DRUG ALLERGIES:   Allergies  Allergen Reactions  . Alprazolam Swelling  . Demerol [Meperidine] Itching  . Fentanyl Other (See Comments)    "burning and hot"    VITALS:  Blood pressure (!) 133/56, pulse 70, temperature 98.9 F (37.2 C), temperature source Axillary, resp. rate (!) 21, weight 62.2 kg (137 lb 2 oz), SpO2 93 %.  PHYSICAL EXAMINATION:   Physical Exam  GENERAL:  72 y.o.-year-old patient lying in bed Lethargic/Encephalopathic.  EYES: Pupils equal, round, reactive to light. No scleral icterus. Extraocular muscles intact.  HEENT: Head atraumatic, normocephalic. Dry Oral Mucosa.  NECK:  Supple, no jugular venous distention. No thyroid enlargement, no tenderness.  LUNGS: Normal breath sounds bilaterally, no wheezing, rales, rhonchi. No use of accessory muscles of respiration.  CARDIOVASCULAR: S1, S2 normal. No murmurs, rubs, or gallops.  ABDOMEN: Soft, nontender, nondistended. Bowel sounds present. No organomegaly or mass.  EXTREMITIES: No cyanosis, clubbing or edema b/l.    NEUROLOGIC: Cranial nerves II through XII are intact. No focal Motor or sensory deficits b/l. Globally weak and follows simple commands.   PSYCHIATRIC: The patient is alert and oriented x 1.  SKIN: No obvious rash, lesion, or ulcer.    LABORATORY PANEL:   CBC Recent Labs  Lab 05/10/17 0243  WBC 10.1  HGB 13.6  HCT 40.8  PLT 306    ------------------------------------------------------------------------------------------------------------------  Chemistries  Recent Labs  Lab 05/10/17 0315  05/13/17 0518  NA 134*   < > 140  K 3.2*   < > 3.3*  CL 100*   < > 107  CO2 22   < > 22  GLUCOSE 286*   < > 132*  BUN 23*   < > 9  CREATININE 1.19*   < > 0.88  CALCIUM 8.9   < > 8.9  MG  --    < > 1.4*  AST 24  --   --   ALT 19  --   --   ALKPHOS 114  --   --   BILITOT 0.5  --   --    < > = values in this interval not displayed.   ------------------------------------------------------------------------------------------------------------------  Cardiac Enzymes No results for input(s): TROPONINI in the last 168 hours. ------------------------------------------------------------------------------------------------------------------  RADIOLOGY:  Dg Abd 1 View  Result Date: 05/13/2017 CLINICAL DATA:  Abdominal distention EXAM: ABDOMEN - 1 VIEW COMPARISON:  04/02/2017 FINDINGS: Mild gaseous distention of centralized bowel loops including the transverse colon and several small bowel loops. Moderate to large stool burden in the right colon. Prior cholecystectomy. No free air organomegaly. No suspicious calcification. IMPRESSION: Mild gaseous distention of centralized bowel loops including transverse colon and small bowel loops. This could reflect focal ileus. Moderate stool burden. Electronically Signed   By: Charlett Nose M.D.   On: 05/13/2017 07:27     ASSESSMENT AND PLAN:   72 year old female past medical history of hypertension, hyperlipidemia, GERD, diabetes, depression, history of  CHF, osteoarthritis, status post pacemaker who presented to the hospital due to altered mental status.  1. Altered mental status-etiology unclear but suspected to be secondary to seizures with post ictal delirium. -Continues to be delirious and, continue Precedex and wean as tolerated.  2. Seizures-suspected to be secondary to patient  taking tramadol which can lower seizure threshold. -continue Keppra, appreciate neurology input and EEG negative for seizures.  - pt. Cannot have MRI as she has a pacemaker.   3. Diabetes type 2 without complication-patient is currently nothing by mouth. Continue low-dose Lantus, sliding scale insulin. - BS Stable  4. Hypokalemia/Hypomagnesemia-cont. ICU replacement protocol and will monitor    All the records are reviewed and case discussed with Care Management/Social Worker. Management plans discussed with the patient, family and they are in agreement.  CODE STATUS: Full code  DVT Prophylaxis: Hep SQ  TOTAL TIME TAKING CARE OF THIS PATIENT: 25 minutes.   POSSIBLE D/C IN 3-4 DAYS, DEPENDING ON CLINICAL CONDITION.   Houston Siren M.D on 05/13/2017 at 2:04 PM  Between 7am to 6pm - Pager - [phone]  After 6pm go to [email or web address] - Social research officer, government  [facility]  Office  [phone]  CC: Primary care physician; Marletta Lor, NP

## 2017-05-13 NOTE — Progress Notes (Signed)
ELECTROLYTE CONSULT    Pharmacy Consult for electrolyte management Indication: hypokalemia/hypomagnesemia  Labs: Recent Labs    05/11/17 0626 05/13/17 0518  CREATININE 0.97 0.88  MG 1.6* 1.4*  PHOS 2.6 4.0   Estimated Creatinine Clearance: 49.6 mL/min (by C-G formula based on SCr of 0.88 mg/dL).  Sodium (mmol/L)  Date Value  05/13/2017 140  04/02/2017 143  06/20/2013 136   Potassium (mmol/L)  Date Value  05/13/2017 3.3 (L)  06/20/2013 4.7   Magnesium (mg/dL)  Date Value  09/40/7680 1.4 (L)  06/20/2013 1.7 (L)     Assessment: 72 yo female admitted with altered mental status/encephalopathy secondary to seizures. Pharmacy has been consulted to manage electrolytes.   Goal of Therapy:  K > 4 Mg > 2  Plan:  K = 3.3, Mg = 1.4. Will replace with K x3 and Mg 4g once. Will obtain follow up labs and replace further as needed.   Yolanda Bonine, PharmD Pharmacy Resident 05/13/2017,9:54 AM

## 2017-05-13 NOTE — Consult Note (Signed)
Psychiatry: Follow-up consult.  See yesterday's note.  Spoke with nursing and sitter and spoke briefly with patient.  Patient is a little more alert and was able to say a few words but not engage in full sentences or conversation.  Not able to give any new information about recent history.  No new seizures apparently.  Boyfriend continues to be present very frequently in the room.  Could not really add anything to the assessment today because of the patient's lack of communication.  I will continue to follow up and try and speak with her when she is awake.  For now continue the IV C and sitter.

## 2017-05-14 LAB — CBC WITH DIFFERENTIAL/PLATELET
BASOS ABS: 0.1 10*3/uL (ref 0–0.1)
BASOS PCT: 1 %
EOS PCT: 1 %
Eosinophils Absolute: 0.1 10*3/uL (ref 0–0.7)
HCT: 31.6 % — ABNORMAL LOW (ref 35.0–47.0)
Hemoglobin: 10.6 g/dL — ABNORMAL LOW (ref 12.0–16.0)
Lymphocytes Relative: 14 %
Lymphs Abs: 1.4 10*3/uL (ref 1.0–3.6)
MCH: 30.2 pg (ref 26.0–34.0)
MCHC: 33.6 g/dL (ref 32.0–36.0)
MCV: 89.8 fL (ref 80.0–100.0)
MONO ABS: 1.1 10*3/uL — AB (ref 0.2–0.9)
Monocytes Relative: 11 %
Neutro Abs: 7.7 10*3/uL — ABNORMAL HIGH (ref 1.4–6.5)
Neutrophils Relative %: 73 %
PLATELETS: 236 10*3/uL (ref 150–440)
RBC: 3.52 MIL/uL — ABNORMAL LOW (ref 3.80–5.20)
RDW: 15.1 % — AB (ref 11.5–14.5)
WBC: 10.3 10*3/uL (ref 3.6–11.0)

## 2017-05-14 LAB — BASIC METABOLIC PANEL
Anion gap: 12 (ref 5–15)
BUN: 12 mg/dL (ref 6–20)
CHLORIDE: 106 mmol/L (ref 101–111)
CO2: 18 mmol/L — AB (ref 22–32)
Calcium: 8.2 mg/dL — ABNORMAL LOW (ref 8.9–10.3)
Creatinine, Ser: 1.15 mg/dL — ABNORMAL HIGH (ref 0.44–1.00)
GFR calc Af Amer: 54 mL/min — ABNORMAL LOW (ref >60)
GFR calc non Af Amer: 47 mL/min — ABNORMAL LOW (ref >60)
GLUCOSE: 84 mg/dL (ref 65–99)
Potassium: 3.2 mmol/L — ABNORMAL LOW (ref 3.5–5.1)
Sodium: 136 mmol/L (ref 135–145)

## 2017-05-14 LAB — PHOSPHORUS: Phosphorus: 3.8 mg/dL (ref 2.5–4.6)

## 2017-05-14 LAB — MAGNESIUM: Magnesium: 1.9 mg/dL (ref 1.7–2.4)

## 2017-05-14 LAB — GLUCOSE, CAPILLARY
Glucose-Capillary: 113 mg/dL — ABNORMAL HIGH (ref 65–99)
Glucose-Capillary: 115 mg/dL — ABNORMAL HIGH (ref 65–99)
Glucose-Capillary: 127 mg/dL — ABNORMAL HIGH (ref 65–99)
Glucose-Capillary: 63 mg/dL — ABNORMAL LOW (ref 65–99)
Glucose-Capillary: 93 mg/dL (ref 65–99)

## 2017-05-14 LAB — LACTIC ACID, PLASMA: Lactic Acid, Venous: 0.8 mmol/L (ref 0.5–1.9)

## 2017-05-14 MED ORDER — POTASSIUM CHLORIDE 10 MEQ/100ML IV SOLN
10.0000 meq | INTRAVENOUS | Status: DC
  Filled 2017-05-14 (×6): qty 100

## 2017-05-14 MED ORDER — MAGNESIUM SULFATE 2 GM/50ML IV SOLN
2.0000 g | Freq: Once | INTRAVENOUS | Status: AC
  Administered 2017-05-14: 2 g via INTRAVENOUS
  Filled 2017-05-14: qty 50

## 2017-05-14 MED ORDER — DEXTROSE 50 % IV SOLN
25.0000 mL | Freq: Once | INTRAVENOUS | Status: AC
  Administered 2017-05-14: 25 mL via INTRAVENOUS

## 2017-05-14 MED ORDER — POTASSIUM CHLORIDE 2 MEQ/ML IV SOLN
INTRAVENOUS | Status: DC
  Administered 2017-05-14 – 2017-05-15 (×3): via INTRAVENOUS
  Filled 2017-05-14 (×5): qty 1000

## 2017-05-14 MED ORDER — POTASSIUM CHLORIDE 10 MEQ/100ML IV SOLN
10.0000 meq | INTRAVENOUS | Status: AC
  Administered 2017-05-14 (×2): 10 meq via INTRAVENOUS
  Filled 2017-05-14 (×2): qty 100

## 2017-05-14 MED ORDER — DEXTROSE 50 % IV SOLN
INTRAVENOUS | Status: AC
  Administered 2017-05-14: 25 mL via INTRAVENOUS
  Filled 2017-05-14: qty 50

## 2017-05-14 NOTE — Progress Notes (Signed)
[facility] Critical [facility] Medicine Progess Note    SYNOPSIS   72 yo female admitted with acute encephalopathy and acute hypoxic respiratory failure likely secondary to seizure activity and possible drug overdose.    ASSESSMENT/PLAN   Neuro: Acute encephalopathylikelysecondary toseizure activity-Remains severely encephalopathic. Neurology re-consulted; awaiting recs. CT scan of the head was negative for acute stroke. Continue Keppra and seizure precuations  Resp: Acute hypoxic respiratory failure. Patient's respiratory status is stable on supplemental O2 via [place]  Renal: Potassium replaced Change IV fluids to LR with KCL 40 mEQ at 198ml/hr  Cardiovascular Hemodynamics per ICU protocol  Patient is currently NPO. PRN IV antihypertensives to maintain SBP<140  Infectious disease Spike a temp; pan cultured and given tylenol  PSYCHIATRY Possible suicidal ideation.  Appreciate psychiatry's input and assistance in management.  We will follow all recommendations.   INTAKE / OUTPUT:  Intake/Output Summary (Last 24 hours) at 05/14/2017 0452 Last data filed at 05/13/2017 1811 Gross per 24 hour  Intake 2012.73 ml  Output 1000 ml  Net 1012.73 ml    Name: Melanie King MRN: [id number] DOB: [birth year]    ADMISSION DATE:  05/10/2017   SUBJECTIVE:   Patient remains severely encephalopathic, screaming and moaning  Intermittentlyscreaming.Spiked a fever. Cultures sent and tylenol given. Neurology consulted, pending recommendations. CT scan of the head did not show any clear intracranial pathology other than age-related atrophy   VITAL SIGNS: Temp:  [98.9 F (37.2 C)-102.5 F (39.2 C)] 102.5 F (39.2 C) (03/11 0400) Pulse Rate:  [70-85] 83 (03/11 0400) Resp:  [0-25] 17 (03/11 0400) BP: (127-173)/(39-78) 160/61 (03/11 0400) SpO2:  [89 %-97 %] 96 % (03/11 0400)  PHYSICAL EXAMINATION: Physical Examination:   VS: BP (!) 160/61   Pulse 83   Temp (!) 102.5 F (39.2 C)  (Axillary)   Resp 17   Wt 137 lb 2 oz (62.2 kg)   LMP  (LMP Unknown)   SpO2 96%   BMI 25.91 kg/m   General Appearance: Acutely ill looking, moderate distress Neuro:Limited exam, confused, encephalopathic moves all extremities, no clear seizure activity noted HEENT: PERRLA, EOM intact. Pulmonary: Bilateral breath sounds, no wheezing  Cardiovascular: Normal S1,S2.  No m/r/g.   Abdomen: Benign, Soft exam Skin:   warm, no rashes, no ecchymosis  Extremities: normal, no cyanosis, clubbing.  LABORATORY PANEL:   CBC Recent Labs  Lab 05/10/17 0243  WBC 10.1  HGB 13.6  HCT 40.8  PLT 306    Chemistries  Recent Labs  Lab 05/10/17 0315  05/13/17 0518  NA 134*   < > 140  K 3.2*   < > 3.3*  CL 100*   < > 107  CO2 22   < > 22  GLUCOSE 286*   < > 132*  BUN 23*   < > 9  CREATININE 1.19*   < > 0.88  CALCIUM 8.9   < > 8.9  MG  --    < > 1.4*  PHOS  --    < > 4.0  AST 24  --   --   ALT 19  --   --   ALKPHOS 114  --   --   BILITOT 0.5  --   --    < > = values in this interval not displayed.    Recent Labs  Lab 05/12/17 2357 05/13/17 0658 05/13/17 1123 05/13/17 1536 05/13/17 1808 05/13/17 2319  GLUCAP 185* 133* 122* 92 90 87   No results for input(s): PHART, PCO2ART,  PO2ART in the last 168 hours. Recent Labs  Lab 05/10/17 0315  AST 24  ALT 19  ALKPHOS 114  BILITOT 0.5  ALBUMIN 4.0    Cardiac Enzymes No results for input(s): TROPONINI in the last 168 hours.  RADIOLOGY:  Dg Abd 1 View  Result Date: 05/13/2017 CLINICAL DATA:  Abdominal distention EXAM: ABDOMEN - 1 VIEW COMPARISON:  04/02/2017 FINDINGS: Mild gaseous distention of centralized bowel loops including the transverse colon and several small bowel loops. Moderate to large stool burden in the right colon. Prior cholecystectomy. No free air organomegaly. No suspicious calcification. IMPRESSION: Mild gaseous distention of centralized bowel loops including transverse colon and small bowel loops. This could  reflect focal ileus. Moderate stool burden. Electronically Signed   By: Charlett Nose M.D.   On: 05/13/2017 07:27    Evelina Lore S. [facility] ANP-BC Pulmonary and Critical Care Medicine [facility] Pager [phone] or [phone]  NB: This document was prepared using Dragon voice recognition software and may include unintentional dictation errors.    05/14/2017

## 2017-05-14 NOTE — Progress Notes (Signed)
SLP Cancellation Note  Patient Details Name: Melanie King MRN: [id number] DOB: 05/04/1945   Cancelled treatment:       Reason Eval/Treat Not Completed: Fatigue/lethargy limiting ability to participate(chart reviewed; MD/NSG notes.) Per MD note, pt is somewhat more awake today but remains on Precedex drip. She has followed some simple commands but still difficult to understand. Will f/u w/ pt tomorrow for potential Cognitive assessment as per order when pt is requiring less medication; when pt can participate is a Cognitive eval. Currently, pt is encephalopathic per notes - likely seizure activity.     Jerilynn Som, MS, CCC-SLP Cleta Heatley 05/14/2017, 4:30 PM

## 2017-05-14 NOTE — Progress Notes (Signed)
PT Cancellation Note  Patient Details Name: Melanie King MRN: [id number] DOB: 1945-10-18   Cancelled Treatment:    Reason Eval/Treat Not Completed: Patient's level of consciousness; Per nursing patient not appropriate for PT eval at this time secondary to AMS and inability to follow commands.  Will attempt to see pt at a future date as medically appropriate.     Ovidio Hanger PT, DPT 05/14/17, 1:03 PM

## 2017-05-14 NOTE — Progress Notes (Signed)
CCU NP notified of pt's axillary temp=102.5 and that I would be administering tylenol 605mg  rectal as ordered. Additionally, NP states she will order labwork for pt.  Awaiting orders.

## 2017-05-14 NOTE — Progress Notes (Signed)
[employer] - [place] at [facility]   PATIENT NAME: Melanie King    MR#:  [id number]  DATE OF BIRTH:  Jan 14, 1946  SUBJECTIVE:   Patient somewhat more awake today remains on Precedex drip follow some simple commands but still difficult to understand.  REVIEW OF SYSTEMS:    Review of Systems  Unable to perform ROS: Mental acuity    Nutrition: NPO Tolerating Diet: No Tolerating PT: Await Eval.    DRUG ALLERGIES:   Allergies  Allergen Reactions  . Alprazolam Swelling  . Demerol [Meperidine] Itching  . Fentanyl Other (See Comments)    "burning and hot"    VITALS:  Blood pressure (!) 170/64, pulse 74, temperature 98.4 F (36.9 C), temperature source Axillary, resp. rate 20, weight 62.2 kg (137 lb 2 oz), SpO2 95 %.  PHYSICAL EXAMINATION:   Physical Exam  GENERAL:  72 y.o.-year-old patient lying in bed Lethargic/Encephalopathic but follows simple commands.  EYES: Pupils equal, round, reactive to light. No scleral icterus. Extraocular muscles intact.  HEENT: Head atraumatic, normocephalic. Dry Oral Mucosa.  NECK:  Supple, no jugular venous distention. No thyroid enlargement, no tenderness.  LUNGS: Normal breath sounds bilaterally, no wheezing, rales, rhonchi. No use of accessory muscles of respiration.  CARDIOVASCULAR: S1, S2 normal. No murmurs, rubs, or gallops.  ABDOMEN: Soft, nontender, nondistended. Bowel sounds present. No organomegaly or mass.  EXTREMITIES: No cyanosis, clubbing or edema b/l.    NEUROLOGIC: Cranial nerves II through XII are intact. No focal Motor or sensory deficits b/l. Globally weak and follows simple commands.   PSYCHIATRIC: The patient is alert and oriented x 1.  SKIN: No obvious rash, lesion, or ulcer.    LABORATORY PANEL:   CBC Recent Labs  Lab 05/14/17 0513  WBC 10.3  HGB 10.6*  HCT 31.6*  PLT 236    ------------------------------------------------------------------------------------------------------------------  Chemistries  Recent Labs  Lab 05/10/17 0315  05/14/17 0440  NA 134*   < > 136  K 3.2*   < > 3.2*  CL 100*   < > 106  CO2 22   < > 18*  GLUCOSE 286*   < > 84  BUN 23*   < > 12  CREATININE 1.19*   < > 1.15*  CALCIUM 8.9   < > 8.2*  MG  --    < > 1.9  AST 24  --   --   ALT 19  --   --   ALKPHOS 114  --   --   BILITOT 0.5  --   --    < > = values in this interval not displayed.   ------------------------------------------------------------------------------------------------------------------  Cardiac Enzymes No results for input(s): TROPONINI in the last 168 hours. ------------------------------------------------------------------------------------------------------------------  RADIOLOGY:  Dg Abd 1 View  Result Date: 05/13/2017 CLINICAL DATA:  Abdominal distention EXAM: ABDOMEN - 1 VIEW COMPARISON:  04/02/2017 FINDINGS: Mild gaseous distention of centralized bowel loops including the transverse colon and several small bowel loops. Moderate to large stool burden in the right colon. Prior cholecystectomy. No free air organomegaly. No suspicious calcification. IMPRESSION: Mild gaseous distention of centralized bowel loops including transverse colon and small bowel loops. This could reflect focal ileus. Moderate stool burden. Electronically Signed   By: Charlett Nose M.D.   On: 05/13/2017 07:27     ASSESSMENT AND PLAN:   72 year old female past medical history of hypertension, hyperlipidemia, GERD, diabetes, depression, history of CHF, osteoarthritis, status post pacemaker who presented to the hospital due to altered mental status.  1. Altered mental status-etiology unclear but suspected to be secondary to seizures with post ictal delirium. -Continues to be delirious continue Precedex and wean as tolerated.  2. Seizures-suspected to be secondary to patient taking  tramadol which can lower seizure threshold. -continue Keppra, appreciate neurology input and EEG negative for seizures.  - pt. Cannot have MRI as she has a pacemaker.   3. Diabetes type 2 without complication-patient is currently nothing by mouth. Continue low-dose Lantus, sliding scale insulin. - BS Stable  4. Hypokalemia/Hypomagnesemia-cont. ICU replacement protocol and follow electrolytes   All the records are reviewed and case discussed with Care Management/Social Worker. Management plans discussed with the patient, family and they are in agreement.  CODE STATUS: Full code  DVT Prophylaxis: Hep SQ  TOTAL TIME TAKING CARE OF THIS PATIENT: 25 minutes.   POSSIBLE D/C IN 3-4 DAYS, DEPENDING ON CLINICAL CONDITION.   Houston Siren M.D on 05/14/2017 at 2:58 PM  Between 7am to 6pm - Pager - [phone]  After 6pm go to [email or web address] - Social research officer, government  [facility] [place] [facility]  Office  [phone]  CC: Primary care physician; Marletta Lor, NP

## 2017-05-14 NOTE — Progress Notes (Signed)
CCU NP notified of UO=200 for 12 hours.

## 2017-05-14 NOTE — Progress Notes (Signed)
Precedex drip off since approximately 10am- patient alert now to self and place.  She is able to tolerate sips of water and bites of applesauce.
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
## 2017-05-14 NOTE — Progress Notes (Signed)
ELECTROLYTE CONSULT    Pharmacy Consult for electrolyte management Indication: hypokalemia/hypomagnesemia  Labs: Recent Labs    05/13/17 0518 05/14/17 0440 05/14/17 0513  WBC  --   --  10.3  HGB  --   --  10.6*  HCT  --   --  31.6*  PLT  --   --  236  CREATININE 0.88 1.15*  --   MG 1.4* 1.9  --   PHOS 4.0  --  3.8   Estimated Creatinine Clearance: 38 mL/min (A) (by C-G formula based on SCr of 1.15 mg/dL (H)).  Sodium (mmol/L)  Date Value  05/14/2017 136  04/02/2017 143  06/20/2013 136   Potassium (mmol/L)  Date Value  05/14/2017 3.2 (L)  06/20/2013 4.7   Magnesium (mg/dL)  Date Value  16/12/9602 1.9  06/20/2013 1.7 (L)     Assessment: 72 yo female admitted with altered mental status/encephalopathy secondary to seizures. Pharmacy has been consulted to manage electrolytes.   Goal of Therapy:  K > 4 Mg > 2  Plan:  K = 3.2, Mg = 1.9. Replaced this AM with K x2  (& receiving LR with KCl ), and will replace Mg 2g once. Will obtain follow up labs and replace further as needed.   Cleopatra Cedar, PharmD Pharmacy Resident 05/14/2017,9:30 AM

## 2017-05-15 ENCOUNTER — Encounter: Payer: Self-pay | Admitting: *Deleted

## 2017-05-15 ENCOUNTER — Inpatient Hospital Stay: Payer: Medicare Other

## 2017-05-15 LAB — BASIC METABOLIC PANEL
ANION GAP: 12 (ref 5–15)
BUN: 10 mg/dL (ref 6–20)
CALCIUM: 8.3 mg/dL — AB (ref 8.9–10.3)
CO2: 16 mmol/L — ABNORMAL LOW (ref 22–32)
Chloride: 107 mmol/L (ref 101–111)
Creatinine, Ser: 0.92 mg/dL (ref 0.44–1.00)
GLUCOSE: 183 mg/dL — AB (ref 65–99)
POTASSIUM: 4.3 mmol/L (ref 3.5–5.1)
Sodium: 135 mmol/L (ref 135–145)

## 2017-05-15 LAB — GLUCOSE, CAPILLARY
Glucose-Capillary: 110 mg/dL — ABNORMAL HIGH (ref 65–99)
Glucose-Capillary: 124 mg/dL — ABNORMAL HIGH (ref 65–99)
Glucose-Capillary: 150 mg/dL — ABNORMAL HIGH (ref 65–99)
Glucose-Capillary: 156 mg/dL — ABNORMAL HIGH (ref 65–99)
Glucose-Capillary: 167 mg/dL — ABNORMAL HIGH (ref 65–99)

## 2017-05-15 LAB — URINE CULTURE: Culture: NO GROWTH

## 2017-05-15 MED ORDER — FUROSEMIDE 10 MG/ML IJ SOLN
40.0000 mg | Freq: Once | INTRAMUSCULAR | Status: AC
  Administered 2017-05-15: 40 mg via INTRAVENOUS
  Filled 2017-05-15: qty 4

## 2017-05-15 MED ORDER — POLYVINYL ALCOHOL 1.4 % OP SOLN
1.0000 [drp] | OPHTHALMIC | Status: DC | PRN
  Administered 2017-05-15: 1 [drp] via OPHTHALMIC
  Filled 2017-05-15: qty 15

## 2017-05-15 NOTE — Progress Notes (Addendum)
Notified Dr. Lonn Raeleen that patient is still disoriented- moans-  Difficult to understand- asked if he wanted to do MRI of brain- He declined due to patient unable to stay still for exam.  BP systolic in 160's and 170's- despite PRN hydralazine- Dr. Lonn Nadea ordered for fluids to be discontinued and a dose of lasix ordered.

## 2017-05-15 NOTE — Evaluation (Signed)
Physical Therapy Evaluation Patient Details Name: Melanie King MRN: [id number] DOB: 1945-05-03 Today's Date: 05/15/2017   History of Present Illness  Pt is a 72 y.o. F who presented to hospital w/ AMS and unwitnessed fall on 05/10/17. Pt admitted 05/10/17 w/ diagnosis of AMS, encephalopathy secondary to seizure ( in ED on 05/10/17), Hypomagnesemia. PMHx includes: CHF, arthritis, DDD cervical & lumbar, depression, DM, GERD, HTN, OA, Pacemaker, tremor.     Clinical Impression  Pt required mod assist x 2 for bed mobility, supine to sit. Therapists used pad to assist moving trunk to sitting position, moving legs from bed to floor, and blocking B knees for safety. Pt demonstrated kyphotic posture and strong anterior lean, Pt did not respond to vc's to lift head and sit upright. Pt returned to supine for safety w/ mod assist x 2 to adjust patient up in the bed and bring BLE up to bed from floor. Pt limited at following simple one step verbal commands and required demonstrations to participate with any simple one step command.  ROM and Strength were difficult to assess due to cognition deficits. Pt would benefit from PT to increase strength, ROM, activity tolerance, bed mobility, transfers and promote optimal return to PLOF; Recommended transition to SNF upon discharge from acute hospitalization.    Follow Up Recommendations SNF    Equipment Recommendations  Rolling walker with 5" wheels;3in1 (PT)    Recommendations for Other Services       Precautions / Restrictions Precautions Precautions: Fall;Other (comment)(Pt on aspiration and seizure precautions.) Restrictions Weight Bearing Restrictions: No      Mobility  Bed Mobility Overal bed mobility: Needs Assistance Bed Mobility: Supine to Sit     Supine to sit: +2 for physical assistance;Mod assist Sit to supine: Mod assist;+2 for physical assistance   General bed mobility comments: Pt required mod assist x 2 with bed mobility, therapists  used pad to assist Pt to sit and move legs from bed to floor. Both knees were blocked by therapists while sitting EOB.  Pt was unable to follow verbal commands to lift chest and head; Pt demonstrated strong forward lean. Therapists returned Pt to supine for safety.  Transfers                    Ambulation/Gait                Stairs            Wheelchair Mobility    Modified Rankin (Stroke Patients Only)       Balance Overall balance assessment: Needs assistance Sitting-balance support: Feet unsupported;Bilateral upper extremity supported Sitting balance-Leahy Scale: Zero Sitting balance - Comments: Pt required mod assist x 2 for balance in sitting; B knees were blocked by therapists; feet unable to reach floor due to bed height.  Pt was unable to follow verbal commands to lift chest and head; Pt demonstrated strong forward lean. Therapists returned Pt to supine for safety. Postural control: Other (comment)(Anterior lean)                                   Pertinent Vitals/Pain Pain Assessment: No/denies pain    Home Living Family/patient expects to be discharged to:: Private residence Living Arrangements: Spouse/significant other(Boyfriend named Geographical information systems officer) Available Help at Discharge: Available 24 hours/day;Friend(s)(Roger) Type of Home: Apartment Home Access: Stairs to enter Entrance Stairs-Rails: None Entrance Stairs-Number of Steps: 1 Home Layout: One  level Home Equipment: Grab bars - toilet      Prior Function Level of Independence: Independent         Comments: Pt unable to answer questions; all information provided is from her boyfriend Geographical information systems officer.     Hand Dominance   Dominant Hand: Right    Extremity/Trunk Assessment   Upper Extremity Assessment Upper Extremity Assessment: Difficult to assess due to impaired cognition RUE Deficits / Details: Pt did not respond with verbal or demonstrative commands to touch head w/ RUE or to  squeeze therapist's fingers in order to assess strength. LUE Deficits / Details: Pt did not respond with verbal or demonstrative commands to touch head w/ LUE or to squeeze therapist's fingers in order to assess strength.    Lower Extremity Assessment Lower Extremity Assessment: Difficult to assess due to impaired cognition;RLE deficits/detail;LLE deficits/detail RLE Deficits / Details: Pt demonstrated at least 2/5 BLE strength with hip flexion and bringing knees to chest. Pt required repeated vc's and a visual demonstration for her to attempt activity. LLE Deficits / Details: Pt demonstrated at least 2/5 BLE strength with hip flexion and bringing knees to chest. Pt required repeated vc's and a visual demonstration for her to attempt activity.    Cervical / Trunk Assessment Cervical / Trunk Assessment: Kyphotic  Communication   Communication: Receptive difficulties;Expressive difficulties  Cognition Arousal/Alertness: Lethargic Behavior During Therapy: Flat affect Overall Cognitive Status: Impaired/Different from baseline Area of Impairment: Following commands;Safety/judgement;Awareness;Orientation;Attention                 Orientation Level: Disoriented to;Person;Place;Time;Situation Current Attention Level: Divided   Following Commands: Follows one step commands inconsistently Safety/Judgement: Decreased awareness of safety;Decreased awareness of deficits Awareness: Anticipatory   General Comments: Pt unable to follow one step commands consistently, Pt responded more to demonstrations in order to perform requested task such as bending knee to chest.      General Comments  Pt agreeable to session.    Exercises     Assessment/Plan    PT Assessment Patient needs continued PT services  PT Problem List Decreased strength;Decreased range of motion;Decreased activity tolerance;Decreased balance;Decreased mobility;Decreased cognition;Decreased knowledge of use of DME;Decreased  safety awareness;Decreased knowledge of precautions       PT Treatment Interventions DME instruction;Gait training;Stair training;Functional mobility training;Therapeutic activities;Therapeutic exercise;Balance training;Cognitive remediation;Patient/family education    PT Goals (Current goals can be found in the Care Plan section)  Acute Rehab PT Goals Patient Stated Goal: I want to be able to walk. PT Goal Formulation: With family(Discussed w/ boyfriend, [name]) Time For Goal Achievement: 05/15/17 Potential to Achieve Goals: Fair    Frequency Min 2X/week   Barriers to discharge        Co-evaluation               AM-PAC PT "6 Clicks" Daily Activity  Outcome Measure Difficulty turning over in bed (including adjusting bedclothes, sheets and blankets)?: Unable Difficulty moving from lying on back to sitting on the side of the bed? : Unable Difficulty sitting down on and standing up from a chair with arms (e.g., wheelchair, bedside commode, etc,.)?: Unable Help needed moving to and from a bed to chair (including a wheelchair)?: Total Help needed walking in hospital room?: Total Help needed climbing 3-5 steps with a railing? : Total 6 Click Score: 6    End of Session Equipment Utilized During Treatment: Gait belt;Oxygen Activity Tolerance: Other (comment)(Treatment limited by cognition and ability to follow commands.) Patient left: in bed;with bed alarm set;with  family/visitor present;with SCD's reapplied(B heels elevated by pillow.) Nurse Communication: Mobility status PT Visit Diagnosis: Muscle weakness (generalized) (M62.81);Unsteadiness on feet (R26.81);Other abnormalities of gait and mobility (R26.89)    Time: 7425-9563 PT Time Calculation (min) (ACUTE ONLY): 28 min   Charges:         PT G Codes:        Astin Rape Mondrian-Pardue, SPT 05/15/2017, 4:46 PM

## 2017-05-15 NOTE — Evaluation (Signed)
Clinical/Bedside Swallow Evaluation Patient Details  Name: Melanie King MRN: [id number] Date of Birth: 06-Oct-1945  Today's Date: 05/15/2017 Time: SLP Start Time (ACUTE ONLY): 0945 SLP Stop Time (ACUTE ONLY): 1045 SLP Time Calculation (min) (ACUTE ONLY): 60 min  Past Medical History:  Past Medical History:  Diagnosis Date  . Arthritis   . CHF (congestive heart failure) (HCC)   . Coronary arteriosclerosis   . DDD (degenerative disc disease), cervical   . DDD (degenerative disc disease), lumbar   . Depression   . Diabetes mellitus without complication (HCC)   . GERD (gastroesophageal reflux disease)   . Hyperlipidemia   . Hypertension   . Osteoarthritis   . Pacemaker   . Tremor    Past Surgical History:  Past Surgical History:  Procedure Laterality Date  . ABDOMINAL HYSTERECTOMY    . BREAST BIOPSY Right 1994   neg cyst removed  . CHOLECYSTECTOMY    . EYE SURGERY  1964, 1966, and 1967   bilateral  . FOOT SURGERY Right    cellulitis  . PACEMAKER INSERTION    . SPINE SURGERY     cyst removed; [facility]   HPI:  Pt is a 72 y/o female with past medical history of diabetes, hypertension, Depression, GERD, DDD, hyperlipidemia, CAD and CHF (systolic) presents to the emergency department after being found unconscious face down in a pile of her medications.  The patient had open tramadol scattered around her.  Her friends who found her report that she has not seen a doctor for a number of months and cannot keep up with her medications.  They state it is unclear what she is supposed to be on but they have a list of everything found in her home that they will bring for reconciliation with pharmacy.  In the emergency department the patient had a generalized tonic-clonic seizure.  She was given Ativan which made her very somnolent.  Oxygen saturations dropped which prompted placement of nonrebreather mask.  At this time she is maintaining her respiratory effort and oxygen saturations  albeit mouth breathing and requiring nonrebreather.  Once it was determined the patient did not need intubation the emergency department staff called the hospitalist service for admission. Patient remains severely encephalopathic, screaming and moaning over the past few days; on Precedex but weaning off currently. Per MD note today, pt w/ acute encephalopathy likely secondary to seizure activity. CT scan of the head was negative for acute stroke, Neurology following, presently on Keppra. Psychiatry following. Pt has remained NPO since admission d/t risk for aspiration sec. to encephalopathy.   Assessment / Plan / Recommendation Clinical Impression  Pt appears to present w/ moderate-severe oral phase dysphagia w/ moderate+ impact on the pharyngeal phase of swallowing; pt is at increased risk for aspiration to occur at this time. Pt exhibited severely decreased oral awareness for boluses, oral hoding and oral residue as the trials melted/broke down. Pt did demonstrate 2 lingual protrusions/sweeps to gather bolus residue then transferred and swallowed. Pharyngeal swallows appeared decreased in laryngeal excursion, intermittent despite max verbal/tactile cues to swallow and clear. Upon examining the oral phase post trial presentation, bolus residue was noted in the sulci areas and removed by SLP the majority of trials. Pt was given mod-max verbal/tactile cues throughout in order to encourage the oral phase of swallowing, however, w/out much response. Suspect Cognitive decline at this time impacting the oral phase of swallowing. Recommend continue NPO status w/ frequent oral care for oral hygiene and stimulation for swallowing.  ST services will f/u tomorrow. NSG/MD updated, agreed.  SLP Visit Diagnosis: Dysphagia, oropharyngeal phase (R13.12)    Aspiration Risk  Mild aspiration risk;Moderate aspiration risk    Diet Recommendation  NPO w/ frequent oral care while NPO; aspiration precautions  Medication  Administration: Via alternative means    Other  Recommendations Recommended Consults: (dietician f/u) Oral Care Recommendations: Oral care QID;Staff/trained caregiver to provide oral care Other Recommendations: (TBD)   Follow up Recommendations Skilled Nursing facility(TBD)      Frequency and Duration min 3x week  2 weeks       Prognosis Prognosis for Safe Diet Advancement: Fair Barriers to Reach Goals: Cognitive deficits;Severity of deficits      Swallow Study   General Date of Onset: 05/10/17 HPI: Pt is a 72 y/o female with past medical history of diabetes, hypertension, Depression, GERD, DDD, hyperlipidemia, CAD and CHF (systolic) presents to the emergency department after being found unconscious face down in a pile of her medications.  The patient had open tramadol scattered around her.  Her friends who found her report that she has not seen a doctor for a number of months and cannot keep up with her medications.  They state it is unclear what she is supposed to be on but they have a list of everything found in her home that they will bring for reconciliation with pharmacy.  In the emergency department the patient had a generalized tonic-clonic seizure.  She was given Ativan which made her very somnolent.  Oxygen saturations dropped which prompted placement of nonrebreather mask.  At this time she is maintaining her respiratory effort and oxygen saturations albeit mouth breathing and requiring nonrebreather.  Once it was determined the patient did not need intubation the emergency department staff called the hospitalist service for admission. Patient remains severely encephalopathic, screaming and moaning over the past few days; on Precedex but weaning off currently. Per MD note today, pt w/ acute encephalopathy likely secondary to seizure activity. CT scan of the head was negative for acute stroke, Neurology following, presently on Keppra. Psychiatry following. Pt has remained NPO since  admission d/t risk for aspiration sec. to encephalopathy. Type of Study: Bedside Swallow Evaluation Previous Swallow Assessment: none reported Diet Prior to this Study: NPO Temperature Spikes Noted: No(wbc 10.3 on 05/14/17) Respiratory Status: Nasal cannula(2 liters) History of Recent Intubation: No Behavior/Cognition: Confused;Distractible;Requires cueing;Doesn't follow directions;Lethargic/Drowsy Oral Cavity Assessment: Dry;Dried secretions Oral Care Completed by SLP: Recent completion by staff Oral Cavity - Dentition: Missing dentition Vision: (n/a) Self-Feeding Abilities: Total assist Patient Positioning: Upright in bed Baseline Vocal Quality: (phonations only) Volitional Cough: Cognitively unable to elicit Volitional Swallow: Unable to elicit    Oral/Motor/Sensory Function Overall Oral Motor/Sensory Function: (appeared grossly wfl w/ most lingual/labial movements)   Ice Chips Ice chips: Impaired Presentation: Spoon(fed; 3 trials) Oral Phase Impairments: Reduced lingual movement/coordination;Poor awareness of bolus;Reduced labial seal Oral Phase Functional Implications: Oral residue;Oral holding;Prolonged oral transit(anterior sulci increased wetness) Pharyngeal Phase Impairments: Suspected delayed Swallow Other Comments: max cues required   Thin Liquid Thin Liquid: Not tested    Nectar Thick Nectar Thick Liquid: Not tested   Honey Thick Honey Thick Liquid: Not tested   Puree Puree: Impaired Presentation: Spoon(fed; 2 trials) Oral Phase Impairments: Poor awareness of bolus;Reduced lingual movement/coordination;Reduced labial seal Oral Phase Functional Implications: Prolonged oral transit;Oral residue;Oral holding(anterior sulci residue) Pharyngeal Phase Impairments: (no transfer to initiate the pharyngeal swallow was noted)   Solid   GO   Solid: Not tested  Jerilynn Som, MS, CCC-SLP Watson,Katherine 05/15/2017,12:11 PM

## 2017-05-15 NOTE — Progress Notes (Signed)
Pharmacy Electrolyte Monitoring Consult:  Pharmacy consulted to assist in monitoring and replacing electrolytes in this 72 y.o. female admitted on 05/10/2017 with Fall   Labs:  Sodium (mmol/L)  Date Value  05/15/2017 135  04/02/2017 143  06/20/2013 136   Potassium (mmol/L)  Date Value  05/15/2017 4.3  06/20/2013 4.7   Magnesium (mg/dL)  Date Value  20/25/4270 1.9  06/20/2013 1.7 (L)   Phosphorus (mg/dL)  Date Value  62/37/6283 3.8   Calcium (mg/dL)  Date Value  15/17/6160 8.3 (L)   Calcium, Total (mg/dL)  Date Value  73/71/0626 9.1   Albumin (g/dL)  Date Value  94/85/4627 4.0  04/02/2017 4.4  06/20/2013 3.7   Assessment: 72 yo female admitted with altered mental status/encephalopathy secondary to seizures. Pharmacy has been consulted to manage electrolytes   Goal of therapy:  K>4 Mg>2  Plan: WNL. Will continue to monitor and replace as necessary.    Cleopatra Cedar, PharmD Pharmacy Resident 05/15/2017 11:34 AM

## 2017-05-15 NOTE — Progress Notes (Signed)
[facility] Progess Note    SYNOPSIS   72 yo female admitted with acute encephalopathy and acute hypoxic respiratory failure likely secondary to seizure activity and possible drug overdose.    ASSESSMENT/PLAN   Acute encephalopathylikelysecondary toseizure activity. CT scan of the head was negative for acute stroke, appreciate neurology input, presently on Keppra. Patient has been weaned off a Precedex, mental status is improving slowly. If patient remains of Precedex will transfer to the floor. Psychiatry has seen patient and does not feel patient is suicidal.  INTAKE / OUTPUT:  Intake/Output Summary (Last 24 hours) at 05/15/2017 0828 Last data filed at 05/15/2017 0700 Gross per 24 hour  Intake 2615.13 ml  Output 850 ml  Net 1765.13 ml    Name: Melanie King MRN: [id number] DOB: Jul 10, 1945    ADMISSION DATE:  05/10/2017   SUBJECTIVE:   Patient has been awake, alert, sometimes screaming and encephalopathic not responding appropriately. Neurology consulted, pending recommendations. CT scan of the head did not show any clear intracranial pathology other than age-related atrophy   VITAL SIGNS: Temp:  [98.1 F (36.7 C)-98.9 F (37.2 C)] 98.1 F (36.7 C) (03/12 0749) Pulse Rate:  [70-117] 114 (03/12 0749) Resp:  [15-30] 28 (03/12 0749) BP: (90-179)/(43-88) 162/75 (03/12 0700) SpO2:  [92 %-97 %] 95 % (03/12 0749) Weight:  [69.4 kg (152 lb 16 oz)] 69.4 kg (152 lb 16 oz) (03/12 0500)  PHYSICAL EXAMINATION: Physical Examination:   VS: BP (!) 162/75   Pulse (!) 114   Temp 98.1 F (36.7 C) (Axillary)   Resp (!) 28   Wt 69.4 kg (152 lb 16 oz)   LMP  (LMP Unknown)   SpO2 95%   BMI 28.91 kg/m   General Appearance: confused, encephalopathic Neuro:Limited exam, moves all extremities, no clear seizure activity noted HEENT: PERRLA, EOM intact. Pulmonary: normal breath sounds   CardiovascularNormal S1,S2.  No m/r/g.   Abdomen: Benign, Soft  exam Skin:   warm, no rashes, no ecchymosis  Extremities: normal, no cyanosis, clubbing.    LABORATORY PANEL:   CBC Recent Labs  Lab 05/14/17 0513  WBC 10.3  HGB 10.6*  HCT 31.6*  PLT 236    Chemistries  Recent Labs  Lab 05/10/17 0315  05/14/17 0440 05/14/17 0513 05/15/17 0358  NA 134*   < > 136  --  135  K 3.2*   < > 3.2*  --  4.3  CL 100*   < > 106  --  107  CO2 22   < > 18*  --  16*  GLUCOSE 286*   < > 84  --  183*  BUN 23*   < > 12  --  10  CREATININE 1.19*   < > 1.15*  --  0.92  CALCIUM 8.9   < > 8.2*  --  8.3*  MG  --    < > 1.9  --   --   PHOS  --    < >  --  3.8  --   AST 24  --   --   --   --   ALT 19  --   --   --   --   ALKPHOS 114  --   --   --   --   BILITOT 0.5  --   --   --   --    < > = values in this interval not displayed.    Recent Labs  Lab  05/14/17 1146 05/14/17 1301 05/14/17 1747 05/14/17 2332 05/15/17 0413 05/15/17 0651  GLUCAP 63* 113* 115* 127* 150* 167*   No results for input(s): PHART, PCO2ART, PO2ART in the last 168 hours. Recent Labs  Lab 05/10/17 0315  AST 24  ALT 19  ALKPHOS 114  BILITOT 0.5  ALBUMIN 4.0    Cardiac Enzymes No results for input(s): TROPONINI in the last 168 hours.  RADIOLOGY:  No results found.  Tora Kindred, DO  05/15/2017

## 2017-05-15 NOTE — Progress Notes (Signed)
[facility] [place] [facility]   PATIENT NAME: Melanie King    MR#:  [id number]  DATE OF BIRTH:  02-Nov-1945  SUBJECTIVE:   Patient is now weaned off the Precedex drip but mental status still not back to baseline. Patient still very lethargic and mumbling and difficult to understand.  REVIEW OF SYSTEMS:    Review of Systems  Unable to perform ROS: Mental acuity    Nutrition: NPO Tolerating Diet: No Tolerating PT: Await Eval.    DRUG ALLERGIES:   Allergies  Allergen Reactions  . Alprazolam Swelling  . Demerol [Meperidine] Itching  . Fentanyl Other (See Comments)    "burning and hot"    VITALS:  Blood pressure (!) 163/59, pulse 92, temperature 97.6 F (36.4 C), temperature source Axillary, resp. rate 13, weight 69.4 kg (152 lb 16 oz), SpO2 95 %.  PHYSICAL EXAMINATION:   Physical Exam  GENERAL:  72 y.o.-year-old patient lying in bed Encephalopathic but follows simple commands.  EYES: Pupils equal, round, reactive to light. No scleral icterus. Extraocular muscles intact.  HEENT: Head atraumatic, normocephalic. Dry Oral Mucosa.  NECK:  Supple, no jugular venous distention. No thyroid enlargement, no tenderness.  LUNGS: Poor Resp. effort, no wheezing, rales, rhonchi. No use of accessory muscles of respiration.  CARDIOVASCULAR: S1, S2 normal. No murmurs, rubs, or gallops.  ABDOMEN: Soft, nontender, nondistended. Bowel sounds present. No organomegaly or mass.  EXTREMITIES: No cyanosis, clubbing or edema b/l.    NEUROLOGIC: Cranial nerves II through XII are intact. No focal Motor or sensory deficits b/l. Globally weak and follows simple commands.   PSYCHIATRIC: The patient is alert and oriented x 1.  SKIN: No obvious rash, lesion, or ulcer.    LABORATORY PANEL:   CBC Recent Labs  Lab 05/14/17 0513  WBC 10.3  HGB 10.6*  HCT 31.6*  PLT 236    ------------------------------------------------------------------------------------------------------------------  Chemistries  Recent Labs  Lab 05/10/17 0315  05/14/17 0440 05/15/17 0358  NA 134*   < > 136 135  K 3.2*   < > 3.2* 4.3  CL 100*   < > 106 107  CO2 22   < > 18* 16*  GLUCOSE 286*   < > 84 183*  BUN 23*   < > 12 10  CREATININE 1.19*   < > 1.15* 0.92  CALCIUM 8.9   < > 8.2* 8.3*  MG  --    < > 1.9  --   AST 24  --   --   --   ALT 19  --   --   --   ALKPHOS 114  --   --   --   BILITOT 0.5  --   --   --    < > = values in this interval not displayed.   ------------------------------------------------------------------------------------------------------------------  Cardiac Enzymes No results for input(s): TROPONINI in the last 168 hours. ------------------------------------------------------------------------------------------------------------------  RADIOLOGY:  US Venous Img Upper Uni Right  Result Date: 05/15/2017 CLINICAL DATA:  Right upper extremity pain and edema. History of left-sided pacemaker placement. History of diabetes and smoking. Evaluate for DVT. EXAM: RIGHT UPPER EXTREMITY VENOUS DOPPLER ULTRASOUND TECHNIQUE: Gray-scale sonography with graded compression, as well as color Doppler and duplex ultrasound were performed to evaluate the upper extremity deep venous system from the level of the subclavian vein and including the jugular, axillary, basilic, radial, ulnar and upper cephalic vein. Spectral Doppler was utilized to evaluate flow at rest and with distal augmentation maneuvers. COMPARISON:  None. FINDINGS: Contralateral Subclavian Vein: Respiratory phasicity is normal and symmetric with the symptomatic side. No evidence of thrombus. Normal compressibility. Internal Jugular Vein: No evidence of thrombus. Normal compressibility, respiratory phasicity and response to augmentation. Subclavian Vein: No evidence of thrombus. Normal compressibility,  respiratory phasicity and response to augmentation. Axillary Vein: No evidence of thrombus. Normal compressibility, respiratory phasicity and response to augmentation. Cephalic Vein: No evidence of thrombus. Normal compressibility, respiratory phasicity and response to augmentation. Basilic Vein: No evidence of thrombus. Normal compressibility, respiratory phasicity and response to augmentation. Brachial Veins: No evidence of thrombus. Normal compressibility, respiratory phasicity and response to augmentation. Radial Veins: No evidence of thrombus. Normal compressibility, respiratory phasicity and response to augmentation. Ulnar Veins: No evidence of thrombus. Normal compressibility, respiratory phasicity and response to augmentation. Venous Reflux:  None visualized. Other Findings:  None visualized. IMPRESSION: No evidence of DVT within the right upper extremity. Electronically Signed   By: Simonne Come M.D.   On: 05/15/2017 12:15     ASSESSMENT AND PLAN:   72 year old female past medical history of hypertension, hyperlipidemia, GERD, diabetes, depression, history of CHF, osteoarthritis, status post pacemaker who presented to the hospital due to altered mental status.  1. Altered mental status-etiology unclear but suspected to be secondary to seizures with post ictal delirium. -patient has not been weaned off the Precedex drip but continues to be delirious.  -Continue supportive care, avoid benzodiazepines, keep awake during the day and sleep at nights. Use Haldol for agitation.   2. Seizures-suspected to be secondary to patient taking tramadol which can lower seizure threshold. -continue Keppra, appreciate neurology input and EEG negative for seizures.  - no further seizures and will cont. To monitor.   3. Diabetes type 2 without complication-patient is currently nothing by mouth. Continue low-dose Lantus, sliding scale insulin. - BS Stable  4. Hypokalemia/Hypomagnesemia- Improved w/  supplementation and will cont. To monitor.    All the records are reviewed and case discussed with Care Management/Social Worker. Management plans discussed with the patient, family and they are in agreement.  CODE STATUS: Full code  DVT Prophylaxis: Hep SQ  TOTAL TIME TAKING CARE OF THIS PATIENT: 25 minutes.   POSSIBLE D/C unclear, and depends of pt's progress  Houston Siren M.D on 05/15/2017 at 3:11 PM  Between 7am to 6pm - Pager - [phone]  After 6pm go to [email or web address] - Social research officer, government  [facility] [place] [facility]  Office  [phone]  CC: Primary care physician; Marletta Lor, NP

## 2017-05-15 NOTE — Progress Notes (Signed)
PT Cancellation Note  Patient Details Name: Melanie King MRN: [id number] DOB: [birth year]   Cancelled Treatment:    Reason Eval/Treat Not Completed: Patient at procedure or test/unavailable.  PT consult received.  Chart reviewed.  Pt currently off unit for testing.  Will re-attempt PT evaluation at a later date/time as medically appropriate.  Hendricks Limes, PT 05/15/17, 11:17 AM [phone]

## 2017-05-16 ENCOUNTER — Inpatient Hospital Stay: Payer: Medicare Other

## 2017-05-16 ENCOUNTER — Other Ambulatory Visit: Payer: Self-pay

## 2017-05-16 LAB — BASIC METABOLIC PANEL
ANION GAP: 16 — AB (ref 5–15)
BUN: 10 mg/dL (ref 6–20)
CO2: 20 mmol/L — ABNORMAL LOW (ref 22–32)
Calcium: 8.7 mg/dL — ABNORMAL LOW (ref 8.9–10.3)
Chloride: 102 mmol/L (ref 101–111)
Creatinine, Ser: 0.79 mg/dL (ref 0.44–1.00)
GFR calc Af Amer: >60 mL/min (ref >60)
Glucose, Bld: 112 mg/dL — ABNORMAL HIGH (ref 65–99)
POTASSIUM: 3.7 mmol/L (ref 3.5–5.1)
SODIUM: 138 mmol/L (ref 135–145)

## 2017-05-16 LAB — GLUCOSE, CAPILLARY
Glucose-Capillary: 107 mg/dL — ABNORMAL HIGH (ref 65–99)
Glucose-Capillary: 121 mg/dL — ABNORMAL HIGH (ref 65–99)
Glucose-Capillary: 176 mg/dL — ABNORMAL HIGH (ref 65–99)
Glucose-Capillary: 241 mg/dL — ABNORMAL HIGH (ref 65–99)
Glucose-Capillary: 98 mg/dL (ref 65–99)

## 2017-05-16 MED ORDER — AMLODIPINE BESYLATE 10 MG PO TABS
10.0000 mg | ORAL_TABLET | Freq: Every day | ORAL | Status: DC
  Administered 2017-05-16 – 2017-05-21 (×6): 10 mg
  Filled 2017-05-16 (×6): qty 1

## 2017-05-16 MED ORDER — METOPROLOL TARTRATE 5 MG/5ML IV SOLN
5.0000 mg | Freq: Once | INTRAVENOUS | Status: AC
  Administered 2017-05-16: 5 mg via INTRAVENOUS
  Filled 2017-05-16: qty 5

## 2017-05-16 MED ORDER — METOPROLOL TARTRATE 25 MG PO TABS
25.0000 mg | ORAL_TABLET | Freq: Two times a day (BID) | ORAL | Status: DC
  Administered 2017-05-16 – 2017-05-21 (×10): 25 mg
  Filled 2017-05-16 (×10): qty 1

## 2017-05-16 MED ORDER — FREE WATER
100.0000 mL | Freq: Three times a day (TID) | Status: DC
  Administered 2017-05-16 – 2017-05-18 (×6): 100 mL

## 2017-05-16 MED ORDER — OSMOLITE 1.2 CAL PO LIQD
1000.0000 mL | ORAL | Status: DC
  Administered 2017-05-16 – 2017-05-18 (×3): 1000 mL

## 2017-05-16 MED ORDER — INSULIN ASPART 100 UNIT/ML ~~LOC~~ SOLN
0.0000 [IU] | SUBCUTANEOUS | Status: DC
  Administered 2017-05-16: 1 [IU] via SUBCUTANEOUS
  Administered 2017-05-16: 2 [IU] via SUBCUTANEOUS
  Administered 2017-05-17: 3 [IU] via SUBCUTANEOUS
  Administered 2017-05-17: 5 [IU] via SUBCUTANEOUS
  Administered 2017-05-17: 7 [IU] via SUBCUTANEOUS
  Administered 2017-05-17 (×3): 3 [IU] via SUBCUTANEOUS
  Administered 2017-05-18: 7 [IU] via SUBCUTANEOUS
  Administered 2017-05-18: 5 [IU] via SUBCUTANEOUS
  Administered 2017-05-18: 7 [IU] via SUBCUTANEOUS
  Administered 2017-05-18: 3 [IU] via SUBCUTANEOUS
  Administered 2017-05-18: 2 [IU] via SUBCUTANEOUS
  Administered 2017-05-18: 5 [IU] via SUBCUTANEOUS
  Administered 2017-05-19: 9 [IU] via SUBCUTANEOUS
  Administered 2017-05-19: 3 [IU] via SUBCUTANEOUS
  Administered 2017-05-19: 5 [IU] via SUBCUTANEOUS
  Administered 2017-05-19: 2 [IU] via SUBCUTANEOUS
  Administered 2017-05-19: 5 [IU] via SUBCUTANEOUS
  Administered 2017-05-19: 1 [IU] via SUBCUTANEOUS
  Administered 2017-05-20: 3 [IU] via SUBCUTANEOUS
  Administered 2017-05-20: 1 [IU] via SUBCUTANEOUS
  Administered 2017-05-20 (×2): 7 [IU] via SUBCUTANEOUS
  Administered 2017-05-21: 5 [IU] via SUBCUTANEOUS
  Administered 2017-05-21: 7 [IU] via SUBCUTANEOUS
  Administered 2017-05-21: 2 [IU] via SUBCUTANEOUS
  Administered 2017-05-21: 3 [IU] via SUBCUTANEOUS
  Filled 2017-05-16 (×29): qty 1

## 2017-05-16 MED ORDER — VITAL HIGH PROTEIN PO LIQD: 1000.0000 mL | ORAL | Status: DC

## 2017-05-16 NOTE — Clinical Social Work Note (Signed)
Clinical Social Work Assessment  Patient Details  Name: Melanie King MRN: [id number] Date of Birth: 28-May-1945  Date of referral:  05/16/17               Reason for consult:  Facility Placement, Discharge Planning                Permission sought to share information with:    Permission granted to share information::     Name::        Agency::     Relationship::     Contact Information:     Housing/Transportation Living arrangements for the past 2 months:  Single Family Home Source of Information:  Nephew: Melanie King: [phone] Patient Interpreter Needed:  None Criminal Activity/Legal Involvement Pertinent to Current Situation/Hospitalization:  No - Comment as needed Significant Relationships:  Other(Comment)(nephew: Melanie King: [phone]) Lives with:  Friends Do you feel safe going back to the place where you live?    Need for family participation in patient care:  Yes (Comment)  Care giving concerns:  Patient resides with a friend.   Social Worker assessment / plan:  CSW noted that PT is recommending STR. CSW was not able to conduct assessment with patient as she continues to be confused. CSW contacted patient's nephew, Melanie King via phone and introduced self and explained role and purpose of call. Melanie King is in agreement for STR. He asked if a place could be found closer to where he lives in [place], [place]. CSW explained that a bed search would be done locally but that CSW could send a referral to facilities in [place], [place] but that if a bed was available locally, patient would need to be placed locally and he could follow up after patient's discharge with getting patient transferred to [facility]. He verbalized understanding. Bed search initiated.  Employment status:  Retired Health and safety inspector:  Medicare PT Recommendations:  Skilled Nursing Facility Information / Referral to community resources:     Patient/Family's Response to care:  Patient's nephew expressed  appreciation for CSW call.  Patient/Family's Understanding of and Emotional Response to Diagnosis, Current Treatment, and Prognosis:  Patient's nephew is involved in patient's treatment plan.  Emotional Assessment Appearance:  Appears older than stated age Attitude/Demeanor/Rapport:  (alert but confused X4) Affect (typically observed):    Orientation:  Fluctuating Orientation (Suspected and/or reported Sundowners) Alcohol / Substance use:  Not Applicable Psych involvement (Current and /or in the community):  No (Comment)  Discharge Needs  Concerns to be addressed:  Care Coordination Readmission within the last 30 days:  No Current discharge risk:  None Barriers to Discharge:  No Barriers Identified   York Spaniel, LCSW 05/16/2017, 2:13 PM

## 2017-05-16 NOTE — Progress Notes (Signed)
Subjective: Patient remains altered.  No further seizures noted.    Objective: Current vital signs: BP (!) 158/85   Pulse 86   Temp 99.1 F (37.3 C) (Oral)   Resp 17   Ht 5\' 5"  (1.651 m)   Wt 69.4 kg (152 lb 16 oz)   LMP  (LMP Unknown)   SpO2 94%   BMI 25.46 kg/m  Vital signs in last 24 hours: Temp:  [97.6 F (36.4 C)-100.4 F (38 C)] 99.1 F (37.3 C) (03/13 0800) Pulse Rate:  [81-103] 86 (03/13 1000) Resp:  [13-26] 17 (03/13 1000) BP: (135-185)/(45-159) 158/85 (03/13 1000) SpO2:  [88 %-97 %] 94 % (03/13 1000) Weight:  [69.4 kg (152 lb 16 oz)] 69.4 kg (152 lb 16 oz) (03/13 0800)  Intake/Output from previous day: 03/12 0701 - 03/13 0700 In: 100 [IV Piggyback:100] Out: 2825 [Urine:2825] Intake/Output this shift: Total I/O In: 100 [IV Piggyback:100] Out: 100 [Urine:100] Nutritional status: Diet NPO time specified Except for: Sips with Meds Seizure precautions Aspiration precautions  Neurologic Exam: Mental Status: Eyes open.  Appears to make attempts at speech but markedly dysarthric.  Does not follow commands.   Cranial Nerves: II: Discs flat bilaterally; Does not blink to bilateral confrontation.  Pupils equal, round, reactive to light and accommodation III,IV, VI: Oculocephalic maneuvers intact.   V,VII: Right corneal absent, left corneal weak VIII: unable to test IX,X: unable to test XI: unable to test XII: unable to test Motor: Moves all extremities weakly Sensory: Responds to noxious stimuli throughout Plantars: Right: upgoing   Left: upgoing   Lab Results: Basic Metabolic Panel: Recent Labs  Lab 05/10/17 0315 05/11/17 0626 05/13/17 0518 05/14/17 0440 05/14/17 0513 05/15/17 0358  NA 134* 140 140 136  --  135  K 3.2* 5.0 3.3* 3.2*  --  4.3  CL 100* 110 107 106  --  107  CO2 22 22 22  18*  --  16*  GLUCOSE 286* 218* 132* 84  --  183*  BUN 23* 12 9 12   --  10  CREATININE 1.19* 0.97 0.88 1.15*  --  0.92  CALCIUM 8.9 9.1 8.9 8.2*  --  8.3*  MG   --  1.6* 1.4* 1.9  --   --   PHOS  --  2.6 4.0  --  3.8  --     Liver Function Tests: Recent Labs  Lab 05/10/17 0315  AST 24  ALT 19  ALKPHOS 114  BILITOT 0.5  PROT 8.0  ALBUMIN 4.0   No results for input(s): LIPASE, AMYLASE in the last 168 hours. No results for input(s): AMMONIA in the last 168 hours.  CBC: Recent Labs  Lab 05/10/17 0243 05/14/17 0513  WBC 10.1 10.3  NEUTROABS  --  7.7*  HGB 13.6 10.6*  HCT 40.8 31.6*  MCV 89.4 89.8  PLT 306 236    Cardiac Enzymes: No results for input(s): CKTOTAL, CKMB, CKMBINDEX, TROPONINI in the last 168 hours.  Lipid Panel: No results for input(s): CHOL, TRIG, HDL, CHOLHDL, VLDL, LDLCALC in the last 168 hours.  CBG: Recent Labs  Lab 05/15/17 0651 05/15/17 1210 05/15/17 1817 05/15/17 2340 05/16/17 0619  GLUCAP 167* 156* 124* 110* 98    Microbiology: Results for orders placed or performed during the hospital encounter of 05/10/17  MRSA PCR Screening     Status: None   Collection Time: 05/10/17  6:06 AM  Result Value Ref Range Status   MRSA by PCR NEGATIVE NEGATIVE Final    Comment:  The GeneXpert MRSA Assay (FDA approved for NASAL specimens only), is one component of a comprehensive MRSA colonization surveillance program. It is not intended to diagnose MRSA infection nor to guide or monitor treatment for MRSA infections. Performed at [facility], [address] [place], [place] [address]   Culture, blood (Routine X 2) w Reflex to ID Panel     Status: None (Preliminary result)   Collection Time: 05/14/17  5:13 AM  Result Value Ref Range Status   Specimen Description BLOOD RIGHT HAND  Final   Special Requests   Final    BOTTLES DRAWN AEROBIC AND ANAEROBIC Blood Culture results may not be optimal due to an excessive volume of blood received in culture bottles   Culture   Final    NO GROWTH 2 DAYS Performed at [facility], [address] [place], [place] [address]    Report  Status PENDING  Incomplete  Culture, blood (Routine X 2) w Reflex to ID Panel     Status: None (Preliminary result)   Collection Time: 05/14/17  5:23 AM  Result Value Ref Range Status   Specimen Description BLOOD LEFT HAND  Final   Special Requests   Final    BOTTLES DRAWN AEROBIC AND ANAEROBIC Blood Culture adequate volume   Culture   Final    NO GROWTH 2 DAYS Performed at [facility], [address] [place], [place] [address]    Report Status PENDING  Incomplete  Urine Culture     Status: None   Collection Time: 05/14/17  6:14 AM  Result Value Ref Range Status   Specimen Description   Final    URINE, RANDOM Performed at [facility], [address] [place], [place] [address]    Special Requests   Final    NONE Performed at [facility], [address], [place], [place] [address]    Culture   Final    NO GROWTH Performed at [facility], [address], [place], [place] [address]    Report Status 05/15/2017 FINAL  Final    Coagulation Studies: No results for input(s): LABPROT, INR in the last 72 hours.  Imaging: US Venous Img Upper Uni Right  Result Date: 05/15/2017 CLINICAL DATA:  Right upper extremity pain and edema. History of left-sided pacemaker placement. History of diabetes and smoking. Evaluate for DVT. EXAM: RIGHT UPPER EXTREMITY VENOUS DOPPLER ULTRASOUND TECHNIQUE: Gray-scale sonography with graded compression, as well as color Doppler and duplex ultrasound were performed to evaluate the upper extremity deep venous system from the level of the subclavian vein and including the jugular, axillary, basilic, radial, ulnar and upper cephalic vein. Spectral Doppler was utilized to evaluate flow at rest and with distal augmentation maneuvers. COMPARISON:  None. FINDINGS: Contralateral Subclavian Vein: Respiratory phasicity is normal and symmetric with the symptomatic side. No evidence of thrombus. Normal compressibility. Internal Jugular  Vein: No evidence of thrombus. Normal compressibility, respiratory phasicity and response to augmentation. Subclavian Vein: No evidence of thrombus. Normal compressibility, respiratory phasicity and response to augmentation. Axillary Vein: No evidence of thrombus. Normal compressibility, respiratory phasicity and response to augmentation. Cephalic Vein: No evidence of thrombus. Normal compressibility, respiratory phasicity and response to augmentation. Basilic Vein: No evidence of thrombus. Normal compressibility, respiratory phasicity and response to augmentation. Brachial Veins: No evidence of thrombus. Normal compressibility, respiratory phasicity and response to augmentation. Radial Veins: No evidence of thrombus. Normal compressibility, respiratory phasicity and response to augmentation. Ulnar Veins: No evidence of  thrombus. Normal compressibility, respiratory phasicity and response to augmentation. Venous Reflux:  None visualized. Other Findings:  None visualized. IMPRESSION: No evidence of DVT within the right upper extremity. Electronically Signed   By: Simonne Come M.D.   On: 05/15/2017 12:15    Medications:  I have reviewed the patient's current medications. Scheduled: . chlorhexidine  15 mL Mouth Rinse BID  . docusate sodium  100 mg Oral BID  . heparin  5,000 Units Subcutaneous Q8H  . insulin aspart  0-9 Units Subcutaneous Q6H  . insulin glargine  10 Units Subcutaneous QHS  . mouth rinse  15 mL Mouth Rinse q12n4p    Assessment/Plan: Patient remains altered.  Etiology unclear. Previous imaging has been unremarkable.    Recommendations: 1.  MRI of the brain without contrast   LOS: 6 days   Thana Farr, MD Neurology [phone] 05/16/2017  10:52 AM

## 2017-05-16 NOTE — Progress Notes (Signed)
Pt continues to be disoriented overnight on room air. No gtts overnight.

## 2017-05-16 NOTE — Progress Notes (Signed)
[employer] [facility] [place] [facility]   PATIENT NAME: Melanie King    MR#:  [id number]  DATE OF BIRTH:  January 26, 1946  SUBJECTIVE:   Patient off the Precedex drip but continues to have altered mental status and lethargy. Patient is mumbling and difficult to understand. Dobbhoff tube has been placed for feeding.  REVIEW OF SYSTEMS:    Review of Systems  Unable to perform ROS: Mental acuity    Nutrition: NPO Tolerating Diet: No Tolerating PT: Await Eval.    DRUG ALLERGIES:   Allergies  Allergen Reactions  . Alprazolam Swelling  . Demerol [Meperidine] Itching  . Fentanyl Other (See Comments)    "burning and hot"    VITALS:  Blood pressure (!) 172/64, pulse (!) 102, temperature 98.7 F (37.1 C), temperature source Oral, resp. rate (!) 21, height 5\' 5"  (1.651 m), weight 69.4 kg (152 lb 16 oz), SpO2 95 %.  PHYSICAL EXAMINATION:   Physical Exam  GENERAL:  72 y.o.-year-old patient lying in bed Encephalopathic but follows simple commands.  EYES: Pupils equal, round, reactive to light. No scleral icterus. Extraocular muscles intact.  HEENT: Head atraumatic, normocephalic. Dry Oral Mucosa. Dobbhoff tube in place for feeding NECK:  Supple, no jugular venous distention. No thyroid enlargement, no tenderness.  LUNGS: Poor Resp. effort, no wheezing, rales, rhonchi. No use of accessory muscles of respiration.  CARDIOVASCULAR: S1, S2 normal. No murmurs, rubs, or gallops.  ABDOMEN: Soft, nontender, nondistended. Bowel sounds present. No organomegaly or mass.  EXTREMITIES: No cyanosis, clubbing or edema b/l.    NEUROLOGIC: Cranial nerves II through XII are intact. No focal Motor or sensory deficits b/l. Globally weak.  PSYCHIATRIC: The patient is alert and oriented x 1.  SKIN: No obvious rash, lesion, or ulcer.    LABORATORY PANEL:   CBC Recent Labs  Lab 05/14/17 0513  WBC 10.3  HGB 10.6*  HCT 31.6*  PLT 236    ------------------------------------------------------------------------------------------------------------------  Chemistries  Recent Labs  Lab 05/10/17 0315  05/14/17 0440  05/16/17 1225  NA 134*   < > 136   < > 138  K 3.2*   < > 3.2*   < > 3.7  CL 100*   < > 106   < > 102  CO2 22   < > 18*   < > 20*  GLUCOSE 286*   < > 84   < > 112*  BUN 23*   < > 12   < > 10  CREATININE 1.19*   < > 1.15*   < > 0.79  CALCIUM 8.9   < > 8.2*   < > 8.7*  MG  --    < > 1.9  --   --   AST 24  --   --   --   --   ALT 19  --   --   --   --   ALKPHOS 114  --   --   --   --   BILITOT 0.5  --   --   --   --    < > = values in this interval not displayed.   ------------------------------------------------------------------------------------------------------------------  Cardiac Enzymes No results for input(s): TROPONINI in the last 168 hours. ------------------------------------------------------------------------------------------------------------------  RADIOLOGY:  US Venous Img Upper Uni Right  Result Date: 05/15/2017 CLINICAL DATA:  Right upper extremity pain and edema. History of left-sided pacemaker placement. History of diabetes and smoking. Evaluate for DVT. EXAM: RIGHT UPPER EXTREMITY VENOUS DOPPLER ULTRASOUND TECHNIQUE: Gray-scale  sonography with graded compression, as well as color Doppler and duplex ultrasound were performed to evaluate the upper extremity deep venous system from the level of the subclavian vein and including the jugular, axillary, basilic, radial, ulnar and upper cephalic vein. Spectral Doppler was utilized to evaluate flow at rest and with distal augmentation maneuvers. COMPARISON:  None. FINDINGS: Contralateral Subclavian Vein: Respiratory phasicity is normal and symmetric with the symptomatic side. No evidence of thrombus. Normal compressibility. Internal Jugular Vein: No evidence of thrombus. Normal compressibility, respiratory phasicity and response to augmentation.  Subclavian Vein: No evidence of thrombus. Normal compressibility, respiratory phasicity and response to augmentation. Axillary Vein: No evidence of thrombus. Normal compressibility, respiratory phasicity and response to augmentation. Cephalic Vein: No evidence of thrombus. Normal compressibility, respiratory phasicity and response to augmentation. Basilic Vein: No evidence of thrombus. Normal compressibility, respiratory phasicity and response to augmentation. Brachial Veins: No evidence of thrombus. Normal compressibility, respiratory phasicity and response to augmentation. Radial Veins: No evidence of thrombus. Normal compressibility, respiratory phasicity and response to augmentation. Ulnar Veins: No evidence of thrombus. Normal compressibility, respiratory phasicity and response to augmentation. Venous Reflux:  None visualized. Other Findings:  None visualized. IMPRESSION: No evidence of DVT within the right upper extremity. Electronically Signed   By: Simonne Come M.D.   On: 05/15/2017 12:15   Dg Abd Portable 1v  Result Date: 05/16/2017 CLINICAL DATA:  72 year old female with a history of nasogastric tube placement EXAM: PORTABLE ABDOMEN - 1 VIEW COMPARISON:  05/13/2017, 04/02/2017 FINDINGS: Gas within stomach and small bowel, with persistent borderline dilated small bowel. Gas within the right colon with paucity of gas in the left colon. Interval placement of metallic tip enteric feeding tube with coaxial wire, terminating in the stomach. Pacing leads incompletely imaged. Cholecystectomy. Atherosclerotic calcifications IMPRESSION: Weighted tip enteric feeding tube terminates within the stomach. Borderline dilated small bowel loops, potentially ileus. Electronically Signed   By: Gilmer Mor D.O.   On: 05/16/2017 13:03     ASSESSMENT AND PLAN:   72 year old female past medical history of hypertension, hyperlipidemia, GERD, diabetes, depression, history of CHF, osteoarthritis, status post pacemaker who  presented to the hospital due to altered mental status.  1. Altered mental status-etiology unclear but suspected to be secondary to seizures with post ictal delirium. -patient has now been weaned off the Precedex drip but continues to be delirious/lethargic.  -Continue supportive care, avoid benzodiazepines, keep awake during the day and sleep at nights. Use Haldol for agitation.   2. Seizures-suspected to be secondary to patient taking tramadol which can lower seizure threshold. -continue Keppra, appreciate neurology input and EEG negative for seizures.  - no further seizures and will cont. To monitor. Neurology is recommending MRI of the brain but unclear this can be done as patient has a pacemaker.  3. Diabetes type 2 without complication-patient is currently nothing by mouth. Continue low-dose Lantus, sliding scale insulin. - BS Stable  4. Hypokalemia/Hypomagnesemia- Improved w/ supplementation and will cont. To monitor.   5. Nutrition-Dobbhoff tube placed today and will start tube feedings today.   All the records are reviewed and case discussed with Care Management/Social Worker. Management plans discussed with the patient, family and they are in agreement.  CODE STATUS: Full code  DVT Prophylaxis: Hep SQ  TOTAL TIME TAKING CARE OF THIS PATIENT: 25 minutes.   POSSIBLE D/C unclear, and depends of pt's progress  Houston Siren M.D on 05/16/2017 at 3:17 PM  Between 7am to 6pm - Pager - [phone]  After  6pm go to [email or web address] - Social research officer, government  [facility]  Office  [phone]  CC: Primary care physician; Marletta Lor, NP

## 2017-05-16 NOTE — Consult Note (Signed)
[facility] Consult   Reason for Consult: Follow-up consult for 72 year old woman who came into the hospital after an overdose but who continues to be delirious several days later Referring Physician: Verdell Carmine Patient Identification: Melanie King MRN:  [id number] Principal Diagnosis: Acute delirium Diagnosis:   Patient Active Problem List   Diagnosis Date Noted  . Acute delirium [R41.0] 05/12/2017  . Seizure ([place]) [R56.9] 05/10/2017  . Elevated sedimentation rate [R70.0] 04/04/2017  . Elevated C-reactive protein (CRP) [R79.82] 04/04/2017  . Chest pain, unspecified [R07.9] 04/02/2017  . Bilateral lower extremity pain (Secondary Area of Pain) (R>L) [U44.034, M79.605] 04/02/2017  . Chronic hip pain, bilateral  (Secondary Area of Pain) (R>L) [M25.551, M25.552, G89.29] 04/02/2017  . Chronic bilateral low back pain with bilateral sciatica [facility] Area of Pain) (R>L) [M54.42, M54.41, G89.29] 04/02/2017  . Chronic pain syndrome [G89.4] 04/02/2017  . Disorder of bone, unspecified [M89.9] 04/02/2017  . Other long term (current) drug therapy [Z79.899] 04/02/2017  . Other specified health status [Z78.9] 04/02/2017  . Long term current use of opiate analgesic [Z79.891] 04/02/2017  . Hip pain, bilateral [M25.551, M25.552] 12/06/2016  . Tremor [R25.1] 05/27/2016  . Trigger finger of left hand [M65.30] 02/16/2016  . Osteoarthritis of both hands [M19.041, M19.042] 02/16/2016  . Angina pectoris ([place]) [I20.9] 01/17/2016  . COPD (chronic obstructive pulmonary disease) ([place]) [J44.9] 01/17/2016  . CAD (coronary artery disease) [I25.10] 01/17/2016  . HLD (hyperlipidemia) [E78.5] 01/17/2016  . Hypertension [I10] 01/17/2016  . Sick sinus syndrome ([place]) [I49.5] 01/17/2016  . Chronic pain [G89.29] 01/17/2016  . Personal history of tobacco use, presenting hazards to health [Z87.891] 11/16/2015  . Diabetes ([place]) [E11.9] 05/12/2013    Total Time spent with patient: 20  minutes  Subjective:   Melanie King is a 72 y.o. female patient admitted with patient not able to give any information.  HPI: Follow-up note.  See previous notes.  72 year old woman presented to the hospital after loss of consciousness.  I was asked to consult because of question about possible suicidality.  Patient briefly returned to almost a normal mental status and was able to have a conversation for at least one day during which time she absolutely denied being suicidal.  Over the last couple days she seems to have declined significantly.  Patient today is unable to answer any questions or consistently follow any orders.  Seems to be very delirious.  Past Psychiatric History: Past history of depression with a distant past history of suicide attempts  Risk to Self: Is patient at risk for suicide?: No Risk to Others:   Prior Inpatient Therapy:   Prior Outpatient Therapy:    Past Medical History:  Past Medical History:  Diagnosis Date  . Arthritis   . CHF (congestive heart failure) ([place])   . Coronary arteriosclerosis   . DDD (degenerative disc disease), cervical   . DDD (degenerative disc disease), lumbar   . Depression   . Diabetes mellitus without complication ([place])   . GERD (gastroesophageal reflux disease)   . Hyperlipidemia   . Hypertension   . Osteoarthritis   . Pacemaker   . Tremor     Past Surgical History:  Procedure Laterality Date  . ABDOMINAL HYSTERECTOMY    . BREAST BIOPSY Right 1994   neg cyst removed  . CHOLECYSTECTOMY    . EYE SURGERY  1964, 1966, and 1967   bilateral  . FOOT SURGERY Right    cellulitis  . PACEMAKER INSERTION    . SPINE SURGERY  cyst removed; [facility]   Family History:  Family History  Problem Relation Age of Onset  . Breast cancer Maternal Aunt 46  . Cancer Mother        colon  . Aneurysm Father    Family Psychiatric  History: Unknown Social History:  Social History   Substance and Sexual Activity  Alcohol Use No      Social History   Substance and Sexual Activity  Drug Use No    Social History   Socioeconomic History  . Marital status: Single    Spouse name: None  . Number of children: None  . Years of education: None  . Highest education level: None  Social Needs  . Financial resource strain: None  . Food insecurity - worry: None  . Food insecurity - inability: None  . Transportation needs - medical: None  . Transportation needs - non-medical: None  Occupational History  . None  Tobacco Use  . Smoking status: Current Every Day Smoker    Packs/day: 0.75    Years: 50.00    Pack years: 37.50    Types: Cigarettes  . Smokeless tobacco: Never Used  Substance and Sexual Activity  . Alcohol use: No  . Drug use: No  . Sexual activity: No  Other Topics Concern  . None  Social History Narrative  . None   Additional Social History:    Allergies:   Allergies  Allergen Reactions  . Alprazolam Swelling  . Demerol [Meperidine] Itching  . Fentanyl Other (See Comments)    "burning and hot"    Labs:  Results for orders placed or performed during the hospital encounter of 05/10/17 (from the past 48 hour(s))  Glucose, capillary     Status: Abnormal   Collection Time: 05/14/17  5:47 PM  Result Value Ref Range   Glucose-Capillary 115 (H) 65 - 99 mg/dL  Glucose, capillary     Status: Abnormal   Collection Time: 05/14/17 11:32 PM  Result Value Ref Range   Glucose-Capillary 127 (H) 65 - 99 mg/dL  Basic metabolic panel     Status: Abnormal   Collection Time: 05/15/17  3:58 AM  Result Value Ref Range   Sodium 135 135 - 145 mmol/L   Potassium 4.3 3.5 - 5.1 mmol/L   Chloride 107 101 - 111 mmol/L   CO2 16 (L) 22 - 32 mmol/L   Glucose, Bld 183 (H) 65 - 99 mg/dL   BUN 10 6 - 20 mg/dL   Creatinine, Ser 0.92 0.44 - 1.00 mg/dL   Calcium 8.3 (L) 8.9 - 10.3 mg/dL   GFR calc non Af Amer >60 >60 mL/min   GFR calc Af Amer >60 >60 mL/min    Comment: (NOTE) The eGFR has been calculated using  the CKD EPI equation. This calculation has not been validated in all clinical situations. eGFR's persistently <60 mL/min signify possible Chronic Kidney Disease.    Anion gap 12 5 - 15    Comment: Performed at [facility], [address] [place], [place] [address]  Glucose, capillary     Status: Abnormal   Collection Time: 05/15/17  4:13 AM  Result Value Ref Range   Glucose-Capillary 150 (H) 65 - 99 mg/dL   Comment 1 Notify RN    Comment 2 Document in Chart   Glucose, capillary     Status: Abnormal   Collection Time: 05/15/17  6:51 AM  Result Value Ref Range   Glucose-Capillary 167 (H) 65 - 99 mg/dL  Glucose, capillary     Status: Abnormal   Collection Time: 05/15/17 12:10 PM  Result Value Ref Range   Glucose-Capillary 156 (H) 65 - 99 mg/dL  Glucose, capillary     Status: Abnormal   Collection Time: 05/15/17  6:17 PM  Result Value Ref Range   Glucose-Capillary 124 (H) 65 - 99 mg/dL  Glucose, capillary     Status: Abnormal   Collection Time: 05/15/17 11:40 PM  Result Value Ref Range   Glucose-Capillary 110 (H) 65 - 99 mg/dL  Glucose, capillary     Status: None   Collection Time: 05/16/17  6:19 AM  Result Value Ref Range   Glucose-Capillary 98 65 - 99 mg/dL  Glucose, capillary     Status: Abnormal   Collection Time: 05/16/17 11:33 AM  Result Value Ref Range   Glucose-Capillary 107 (H) 65 - 99 mg/dL  Basic metabolic panel     Status: Abnormal   Collection Time: 05/16/17 12:25 PM  Result Value Ref Range   Sodium 138 135 - 145 mmol/L   Potassium 3.7 3.5 - 5.1 mmol/L   Chloride 102 101 - 111 mmol/L   CO2 20 (L) 22 - 32 mmol/L   Glucose, Bld 112 (H) 65 - 99 mg/dL   BUN 10 6 - 20 mg/dL   Creatinine, Ser 0.79 0.44 - 1.00 mg/dL   Calcium 8.7 (L) 8.9 - 10.3 mg/dL   GFR calc non Af Amer >60 >60 mL/min   GFR calc Af Amer >60 >60 mL/min    Comment: (NOTE) The eGFR has been calculated using the CKD EPI equation. This calculation has not been validated in all clinical  situations. eGFR's persistently <60 mL/min signify possible Chronic Kidney Disease.    Anion gap 16 (H) 5 - 15    Comment: Performed at [facility], [address] [place], [place] [address]  Glucose, capillary     Status: Abnormal   Collection Time: 05/16/17  4:20 PM  Result Value Ref Range   Glucose-Capillary 121 (H) 65 - 99 mg/dL    Current Facility-Administered Medications  Medication Dose Route Frequency Provider Last Rate Last Dose  . acetaminophen (TYLENOL) tablet 650 mg  650 mg Oral Q6H PRN Harrie Foreman, MD       Or  . acetaminophen (TYLENOL) suppository 650 mg  650 mg Rectal Q6H PRN Harrie Foreman, MD   650 mg at 05/14/17 0451  . amLODipine (NORVASC) tablet 10 mg  10 mg Per Tube Daily Conforti, Tavius Turgeon, DO   10 mg at 05/16/17 1622  . chlorhexidine (PERIDEX) 0.12 % solution 15 mL  15 mL Mouth Rinse BID Conforti, Amijah Timothy, DO   15 mL at 05/16/17 0909  . docusate sodium (COLACE) capsule 100 mg  100 mg Oral BID Harrie Foreman, MD      . feeding supplement (OSMOLITE 1.2 CAL) liquid 1,000 mL  1,000 mL Per Tube Continuous Conforti, Candice Lunney, DO 60 mL/hr at 05/16/17 1600 1,000 mL at 05/16/17 1600  . free water 100 mL  100 mL Per Tube Q8H Conforti, Akshay Spang, DO   100 mL at 05/16/17 1520  . heparin injection 5,000 Units  5,000 Units Subcutaneous Q8H Harrie Foreman, MD   5,000 Units at 05/16/17 1335  . hydrALAZINE (APRESOLINE) injection 10-20 mg  10-20 mg Intravenous Q4H PRN Wilhelmina Mcardle, MD   15 mg at 05/16/17 1242  . insulin aspart (novoLOG) injection 0-9 Units  0-9 Units Subcutaneous Q4H Conforti, Karder Goodin, DO  1 Units at 05/16/17 1623  . insulin glargine (LANTUS) injection 10 Units  10 Units Subcutaneous QHS Conforti, Joe Tanney, DO   10 Units at 05/16/17 0000  . levETIRAcetam (KEPPRA) 500 mg in sodium chloride 0.9 % 100 mL IVPB  500 mg Intravenous Q12H Loletha Grayer, MD   Stopped at 05/16/17 1122  . LORazepam (ATIVAN) injection 0.5-1 mg  0.5-1 mg Intravenous Q4H PRN  Loletha Grayer, MD   1 mg at 05/16/17 1335  . MEDLINE mouth rinse  15 mL Mouth Rinse q12n4p Conforti, Claryssa Sandner, DO   15 mL at 05/16/17 1525  . metoprolol tartrate (LOPRESSOR) tablet 25 mg  25 mg Per Tube BID Conforti, Nielle Duford, DO      . morphine 2 MG/ML injection 2-4 mg  2-4 mg Intravenous Q3H PRN Dorene Sorrow S, NP   2 mg at 05/16/17 1125  . ondansetron (ZOFRAN) tablet 4 mg  4 mg Oral Q6H PRN Harrie Foreman, MD       Or  . ondansetron [facility]) injection 4 mg  4 mg Intravenous Q6H PRN Harrie Foreman, MD      . polyvinyl alcohol (LIQUIFILM TEARS) 1.4 % ophthalmic solution 1 drop  1 drop Both Eyes PRN Conforti, Connelly [place], DO   1 drop at 05/15/17 1819    Musculoskeletal: Strength & Muscle Tone: decreased Gait & Station: unable to stand Patient leans: N/A  Psychiatric Specialty Exam: Physical Exam  Nursing note and vitals reviewed. Constitutional: She appears well-developed.  HENT:  Head: Normocephalic and atraumatic.  Eyes: Conjunctivae are normal. Pupils are equal, round, and reactive to light.  Neck: Normal range of motion.  Cardiovascular: Normal heart sounds.  Respiratory: Effort normal.  GI: Soft.  Musculoskeletal: Normal range of motion.  Skin: Skin is warm and dry.  Psychiatric: Her affect is blunt.    Review of Systems  Unable to perform ROS: Patient unresponsive    Blood pressure (!) 163/89, pulse 88, temperature 98 F (36.7 C), temperature source Oral, resp. rate 20, height _0  (1.651 m), weight 69.4 kg (152 lb 16 oz), SpO2 94 %.Body mass index is 25.46 kg/m.  General Appearance: Disheveled  Eye Contact:  None  Speech:  Garbled and Slurred  Volume:  Decreased  Mood:  Negative  Affect:  Negative  Thought Process:  NA  Orientation:  Negative  Thought Content:  Negative  Suicidal Thoughts:  No  Homicidal Thoughts:  No  Memory:  Negative  Judgement:  Negative  Insight:  Negative  Psychomotor Activity:  Negative  Concentration:  Attention Span: Negative   Recall:  Negative  Fund of Knowledge:  Negative  Language:  Negative  Akathisia:  Negative  Handed:  Right  AIMS (if indicated):     Assets:  Social Support  ADL's:  Impaired  Cognition:  Impaired,  Severe  Sleep:        Treatment Plan Summary: Plan Patient delirium appears to be worsening.  Unclear etiology to me.  Neurology also involved in following up.  Not possible to do an MRI scan.  Patient is not currently on any psychiatric medications other than as needed medicines for stabilizing agitation.  Patient's condition is not likely to be primarily psychiatric.  I will follow only as needed at this point.  Disposition: No evidence of imminent risk to self or others at present.   Patient does not meet criteria for psychiatric inpatient admission.  Alethia Berthold, MD 05/16/2017 4:37 PM

## 2017-05-16 NOTE — Progress Notes (Addendum)
[facility] Progess Note    SYNOPSIS   72 yo female admitted with acute encephalopathy and acute hypoxic respiratory failure likely secondary to seizure activity and possible drug overdose.    ASSESSMENT/PLAN   Acute encephalopathylikelysecondary toseizure activity. CT scan of the head was negative for acute stroke, appreciate neurology input, presently on Keppra. Patient has been weaned off a Precedex, mental status is improving slowly but still remains altered. Unable to obtain MRI of the brain secondary to pacemaker  INTAKE / OUTPUT:  Intake/Output Summary (Last 24 hours) at 05/16/2017 0835 Last data filed at 05/16/2017 0800 Gross per 24 hour  Intake 100 ml  Output 2925 ml  Net -2825 ml    Name: Melanie King MRN: [id number] DOB: [birth year]    ADMISSION DATE:  05/10/2017   SUBJECTIVE:   Patient has been awake, alert, sometimes screaming and encephalopathic not responding appropriately. Neurology consulted, pending recommendations. CT scan of the head did not show any clear intracranial pathology other than age-related atrophy   VITAL SIGNS: Temp:  [97.6 F (36.4 C)-100.4 F (38 C)] 99.1 F (37.3 C) (03/13 0800) Pulse Rate:  [81-106] 88 (03/13 0800) Resp:  [13-26] 17 (03/13 0800) BP: (135-185)/(45-159) 179/60 (03/13 0800) SpO2:  [90 %-97 %] 91 % (03/13 0800) Weight:  [69.4 kg (152 lb 16 oz)] 69.4 kg (152 lb 16 oz) (03/13 0800)  PHYSICAL EXAMINATION: Physical Examination:   VS: BP (!) 179/60 (BP Location: Left Arm)   Pulse 88   Temp 99.1 F (37.3 C) (Oral)   Resp 17   Ht 5\' 5"  (1.651 m)   Wt 69.4 kg (152 lb 16 oz)   LMP  (LMP Unknown)   SpO2 91%   BMI 25.46 kg/m   General Appearance: confused, encephalopathic Neuro:Limited exam, moves all extremities, no clear seizure activity noted HEENT: PERRLA, EOM intact. Pulmonary: normal breath sounds   CardiovascularNormal S1,S2.  No m/r/g.   Abdomen: Benign, Soft exam Skin:   warm,  no rashes, no ecchymosis  Extremities: normal, no cyanosis, clubbing.    LABORATORY PANEL:   CBC Recent Labs  Lab 05/14/17 0513  WBC 10.3  HGB 10.6*  HCT 31.6*  PLT 236    Chemistries  Recent Labs  Lab 05/10/17 0315  05/14/17 0440 05/14/17 0513 05/15/17 0358  NA 134*   < > 136  --  135  K 3.2*   < > 3.2*  --  4.3  CL 100*   < > 106  --  107  CO2 22   < > 18*  --  16*  GLUCOSE 286*   < > 84  --  183*  BUN 23*   < > 12  --  10  CREATININE 1.19*   < > 1.15*  --  0.92  CALCIUM 8.9   < > 8.2*  --  8.3*  MG  --    < > 1.9  --   --   PHOS  --    < >  --  3.8  --   AST 24  --   --   --   --   ALT 19  --   --   --   --   ALKPHOS 114  --   --   --   --   BILITOT 0.5  --   --   --   --    < > = values in this interval not displayed.    Recent Labs  Lab 05/15/17 0413 05/15/17 0651 05/15/17 1210 05/15/17 1817 05/15/17 2340 05/16/17 0619  GLUCAP 150* 167* 156* 124* 110* 98   No results for input(s): PHART, PCO2ART, PO2ART in the last 168 hours. Recent Labs  Lab 05/10/17 0315  AST 24  ALT 19  ALKPHOS 114  BILITOT 0.5  ALBUMIN 4.0    Cardiac Enzymes No results for input(s): TROPONINI in the last 168 hours.  RADIOLOGY:  US Venous Img Upper Uni Right  Result Date: 05/15/2017 CLINICAL DATA:  Right upper extremity pain and edema. History of left-sided pacemaker placement. History of diabetes and smoking. Evaluate for DVT. EXAM: RIGHT UPPER EXTREMITY VENOUS DOPPLER ULTRASOUND TECHNIQUE: Gray-scale sonography with graded compression, as well as color Doppler and duplex ultrasound were performed to evaluate the upper extremity deep venous system from the level of the subclavian vein and including the jugular, axillary, basilic, radial, ulnar and upper cephalic vein. Spectral Doppler was utilized to evaluate flow at rest and with distal augmentation maneuvers. COMPARISON:  None. FINDINGS: Contralateral Subclavian Vein: Respiratory phasicity is normal and symmetric with the  symptomatic side. No evidence of thrombus. Normal compressibility. Internal Jugular Vein: No evidence of thrombus. Normal compressibility, respiratory phasicity and response to augmentation. Subclavian Vein: No evidence of thrombus. Normal compressibility, respiratory phasicity and response to augmentation. Axillary Vein: No evidence of thrombus. Normal compressibility, respiratory phasicity and response to augmentation. Cephalic Vein: No evidence of thrombus. Normal compressibility, respiratory phasicity and response to augmentation. Basilic Vein: No evidence of thrombus. Normal compressibility, respiratory phasicity and response to augmentation. Brachial Veins: No evidence of thrombus. Normal compressibility, respiratory phasicity and response to augmentation. Radial Veins: No evidence of thrombus. Normal compressibility, respiratory phasicity and response to augmentation. Ulnar Veins: No evidence of thrombus. Normal compressibility, respiratory phasicity and response to augmentation. Venous Reflux:  None visualized. Other Findings:  None visualized. IMPRESSION: No evidence of DVT within the right upper extremity. Electronically Signed   By: Simonne Come M.D.   On: 05/15/2017 12:15    Tora Kindred, DO  05/16/2017

## 2017-05-16 NOTE — Progress Notes (Signed)
SLP Cancellation Note  Patient Details Name: [place] F Nola MRN: [id number] DOB: 1945-08-30   Cancelled treatment:       Reason Eval/Treat Not Completed: Fatigue/lethargy limiting ability to participate;Patient's level of consciousness;Patient not medically ready(chart reviewed). Pt continues to present w/ declined Cognitive status/awareness overall; fidgity/restless in the bed, muttered speech intermittently but unable to indicate wants/needs or remain alert for any length of time.  Pt is not appropriate for a Cognitive assessment at this time d/t degree of AMS. Pt is receiving an NG tube for nutritional support today d/t AMS/poor follow through and decreased safety w/ any oral intake. Recommend ST f/u next 3-4 days when pt's mental status improves for follow through w/ a Cognitive assessment. NSG agreed.     Jerilynn Som, MS, CCC-SLP Ivanna Kocak 05/16/2017, 10:43 AM

## 2017-05-16 NOTE — Progress Notes (Signed)
Dobhoff in stomach per ABD XR, stylet withdrawn.  Clamped for now.  RD Leanne contacted, she will place feeding orders in an hour or so.

## 2017-05-16 NOTE — Progress Notes (Addendum)
Initial Nutrition Assessment  DOCUMENTATION CODES:   Not applicable  INTERVENTION:  Initiate Osmolite 1.2 at 60 mL/hr (1440 mL goal daily volume) via NGT. Provides 1728 kcal, 80 grams of protein, 1181 mL H2O daily. Initiate PEPuP protocol.  Provide 100 mL free water flush Q8hrs. Provides total of 1481 mL H2O daily.  Recommend checking potassium, phosphorus, and magnesium with AM labs.  If patient is unable to remain >30 degrees in bed, will need to extend tube post-pyloric under fluoroscopy as she is at risk of aspiration.  NUTRITION DIAGNOSIS:   Inadequate oral intake related to inability to eat as evidenced by NPO status.  GOAL:   Patient will meet greater than or equal to 90% of their needs  MONITOR:   Diet advancement, Labs, Weight trends, TF tolerance, I & O's  REASON FOR ASSESSMENT:   NPO/Clear Liquid Diet    ASSESSMENT:   72 year old female with PMHx of DM type 2, HTN, depression, GERD, OA, DDD, HLD, CHF, hx pacemaker placement now admitted with acute encephalopathy and acute hypoxic respiratory failure likely secondary to seizure activity and possible drug overdose.   -Patient has not been alert enough for bedside SLP evaluation so far this admission.  Patient sleeping on her side at time of RD assessment. She had received morphine earlier in the morning and was very lethargic. Unable to provide any history. No family at bedside to provide nutrition or weight history. No safety mitts on at this time.  Plan was for placement of nasojejunal tube under fluoroscopy, but per chart DG cannot place a tube until nurse has attempted placement. The reason fluoroscopy placement was requested was for post-pyloric placement, which is very difficult to do with blind placement.  Access: 12 Fr. NGT placed 3/13; terminates in stomach per abdominal x-ray 3/13; 55 cm at right nare  Per chart she has been weight stable between 59.4-61.3 kg for the past 2 years. Will use weight of 62.2  kg from 3/8 to estimate needs as current weight is likely falsely elevated.  Medications reviewed and include: Colace 100 mg BID, Novolog 0-9 units Q6hrs, Lantus 10 units QHS, Keppra.  Labs reviewed: CBG 98-156, CO2 20.  Patient does not meet criteria for malnutrition at this time. She is at risk for development of acute malnutrition unless able to meet her calorie and protein needs.  Discussed with RN and on rounds.  NUTRITION - FOCUSED PHYSICAL EXAM:    Most Recent Value  Orbital Region  No depletion  Upper Arm Region  No depletion  Thoracic and Lumbar Region  No depletion  Buccal Region  No depletion  Temple Region  No depletion  Clavicle Bone Region  No depletion  Clavicle and Acromion Bone Region  No depletion  Scapular Bone Region  No depletion  Dorsal Hand  Mild depletion  Patellar Region  Mild depletion  Anterior Thigh Region  Mild depletion  Posterior Calf Region  Moderate depletion  Edema (RD Assessment)  None  Hair  Reviewed  Eyes  Unable to assess  Mouth  Unable to assess  Skin  Reviewed  Nails  Reviewed     Diet Order:  Diet NPO time specified Except for: Sips with Meds Seizure precautions Aspiration precautions  EDUCATION NEEDS:   No education needs have been identified at this time  Skin:  Skin Assessment: Reviewed RN Assessment  Last BM:  05/13/2017 - smear type 6  Height:   Ht Readings from Last 1 Encounters:  05/16/17 5\' 5"  (  1.651 m)    Weight:   Wt Readings from Last 1 Encounters:  05/16/17 152 lb 16 oz (69.4 kg)    Ideal Body Weight:  56.8 kg  BMI:  Body mass index is 25.46 kg/m.  Estimated Nutritional Needs:   Kcal:  1555-1865 (25-30 kcal/kg)  Protein:  75-90 grams (1.2-1.4 grams/kg)  Fluid:  1.5-1.8 L/day (25-30 mL/kg)  Helane Rima, MS, RD, LDN Office: [phone] Pager: [phone] After Hours/Weekend Pager: [phone]

## 2017-05-16 NOTE — Progress Notes (Signed)
Pharmacy Electrolyte Monitoring Consult:  Pharmacy consulted to assist in monitoring and replacing electrolytes in this 72 y.o. female admitted on 05/10/2017 with Fall   Labs:  Sodium (mmol/L)  Date Value  05/16/2017 138  04/02/2017 143  06/20/2013 136   Potassium (mmol/L)  Date Value  05/16/2017 3.7  06/20/2013 4.7   Magnesium (mg/dL)  Date Value  39/53/2023 1.9  06/20/2013 1.7 (L)   Phosphorus (mg/dL)  Date Value  34/35/6861 3.8   Calcium (mg/dL)  Date Value  68/37/2902 8.7 (L)   Calcium, Total (mg/dL)  Date Value  01/19/5207 9.1   Albumin (g/dL)  Date Value  04/28/3610 4.0  04/02/2017 4.4  06/20/2013 3.7    Plan: No replacement necessary at this time.   Pharmacy to continue monitoring and replacing electrolytes per protocol.   Melanie King, PharmD  Pharmacy Resident  05/16/2017 12:43 PM

## 2017-05-16 NOTE — Progress Notes (Signed)
Tube feeds begun at 1520. No s/sx distress    Dr Lonn Anzley notified of continuing elevated BP.  Restart amlodipine and metoprolol per tube, also give 5 mg IV metoprolol now.

## 2017-05-16 NOTE — Progress Notes (Signed)
We will cancel MRI, pt has PPM, although no pacer spikes are seen, Dr Thad Ranger aware.  According to "Care Everywhere" pacer is a [employer] brand placed in 2013.  Per Dr Thad Ranger this will probably preclude the ability to do an MRI anywhere.    Also, writer attempted to place #14 NG tube d/t IR states they will not attempt unless RN has already attempted, pt became very agitated, upset and tearful.  Tube entered oral cavity.  She was unable to cooperate and legibly asked "please don't put that thing back in my nose."    Writer has contacted 1C and supply chain and I am told there are NO DOBHOFFs in the hospital.  Will discuss with Dr Lonn Maliea and RD Alesia Banda for plan going forward

## 2017-05-16 NOTE — Clinical Social Work Note (Deleted)
Clinical Social Work Assessment  Patient Details  Name: Melanie King MRN: [id number] Date of Birth: Jun 04, 1945  Date of referral:  05/16/17               Reason for consult:  Facility Placement, Discharge Planning                Permission sought to share information with:    Permission granted to share information::     Name::        Agency::     Relationship::     Contact Information:     Housing/Transportation Living arrangements for the past 2 months:  Single Family Home Source of Information:  Spouse Patient Interpreter Needed:  None Criminal Activity/Legal Involvement Pertinent to Current Situation/Hospitalization:  No - Comment as needed Significant Relationships:  Spouse, Adult Children Lives with:  Spouse Do you feel safe going back to the place where you live?    Need for family participation in patient care:  Yes (Comment)  Care giving concerns:  Patient resides at home with her spouse.   Social Worker assessment / plan:  CSW was asked to see patient for potential placement. PT is recommending STR however, due to patient having Medicare [employer], PT will need to see at least every 2 days in order for patient's insurance to make an initial determination. CSW spoke to patient's husband at patient's bedside. Patient was hallucinating, talking to herself and reaching for things when CSW came into the room. CSW explained role and purpose of visit. Patient's husband was wanting for patient to be placed and is in agreement with trying to see if she can do rehab. CSW voiced concerns that patient may potentially not be able to follow directions in order to participate with PT. Patient will have to be able to participate in order for insurance to consider authorization. This was all vocalized to patient's husband. CSW explained that if he was wanting to place her in a facility on a long term basis, he would need to pay privately or apply for medicaid. Patient's husband does not  believe they will qualify for medicaid at this time. CSW will begin bed search and provide out of pocket info and medicaid info to patient's husband.  Employment status:  Retired Database administrator PT Recommendations:  Skilled Nursing Facility Information / Referral to community resources:     Patient/Family's Response to care:  Patient's husband expressed appreciation for CSW assistance.  Patient/Family's Understanding of and Emotional Response to Diagnosis, Current Treatment, and Prognosis:  Patient's husband is involved with patient's treatment plan.  Emotional Assessment Appearance:  Appears older than stated age Attitude/Demeanor/Rapport:  (alert but confused X4) Affect (typically observed):    Orientation:  Fluctuating Orientation (Suspected and/or reported Sundowners) Alcohol / Substance use:  Not Applicable Psych involvement (Current and /or in the community):  No (Comment)  Discharge Needs  Concerns to be addressed:  Care Coordination Readmission within the last 30 days:  No Current discharge risk:  None Barriers to Discharge:  No Barriers Identified   York Spaniel, LCSW 05/16/2017, 12:29 PM

## 2017-05-16 NOTE — NC FL2 (Signed)
[place] MEDICAID FL2 LEVEL OF CARE SCREENING TOOL     IDENTIFICATION  Patient Name: Melanie King Birthdate: 1945-05-28 Sex: female Admission Date (Current Location): 05/10/2017  [place] Number:  Chiropodist and Address:  [facility], [address] [place], [place] [address]      Provider Number: [id number]  Attending Physician Name and Address:  Houston Siren, MD  Relative Name and Phone Number:       Current Level of Care: Hospital Recommended Level of Care: Skilled Nursing Facility Prior Approval Number:    Date Approved/Denied:   PASRR Number:    Discharge Plan: SNF    Current Diagnoses: Patient Active Problem List   Diagnosis Date Noted  . Acute delirium 05/12/2017  . Seizure (HCC) 05/10/2017  . Elevated sedimentation rate 04/04/2017  . Elevated C-reactive protein (CRP) 04/04/2017  . Chest pain, unspecified 04/02/2017  . Bilateral lower extremity pain (Secondary Area of Pain) (R>L) 04/02/2017  . Chronic hip pain, bilateral  (Secondary Area of Pain) (R>L) 04/02/2017  . Chronic bilateral low back pain with bilateral sciatica [facility] Area of Pain) (R>L) 04/02/2017  . Chronic pain syndrome 04/02/2017  . Disorder of bone, unspecified 04/02/2017  . Other long term (current) drug therapy 04/02/2017  . Other specified health status 04/02/2017  . Long term current use of opiate analgesic 04/02/2017  . Hip pain, bilateral 12/06/2016  . Tremor 05/27/2016  . Trigger finger of left hand 02/16/2016  . Osteoarthritis of both hands 02/16/2016  . Angina pectoris (HCC) 01/17/2016  . COPD (chronic obstructive pulmonary disease) (HCC) 01/17/2016  . CAD (coronary artery disease) 01/17/2016  . HLD (hyperlipidemia) 01/17/2016  . Hypertension 01/17/2016  . Sick sinus syndrome (HCC) 01/17/2016  . Chronic pain 01/17/2016  . Personal history of tobacco use, presenting hazards to health 11/16/2015  . Diabetes (HCC) 05/12/2013     Orientation RESPIRATION BLADDER Height & Weight     Self  Normal, O2(2 liters) Continent Weight: 152 lb 16 oz (69.4 kg) Height:  5\' 5"  (165.1 cm)  BEHAVIORAL SYMPTOMS/MOOD NEUROLOGICAL BOWEL NUTRITION STATUS  (none) (none) Continent Diet  AMBULATORY STATUS COMMUNICATION OF NEEDS Skin   Extensive Assist Verbally Normal                       Personal Care Assistance Level of Assistance  Dressing, Bathing Bathing Assistance: Maximum assistance   Dressing Assistance: Maximum assistance     Functional Limitations Info  (none stated)          SPECIAL CARE FACTORS FREQUENCY  PT (By licensed PT)                    Contractures Contractures Info: Not present    Additional Factors Info                  Current Medications (05/16/2017):  This is the current hospital active medication list Current Facility-Administered Medications  Medication Dose Route Frequency Provider Last Rate Last Dose  . acetaminophen (TYLENOL) tablet 650 mg  650 mg Oral Q6H PRN Arnaldo Natal, MD       Or  . acetaminophen (TYLENOL) suppository 650 mg  650 mg Rectal Q6H PRN Arnaldo Natal, MD   650 mg at 05/14/17 0451  . chlorhexidine (PERIDEX) 0.12 % solution 15 mL  15 mL Mouth Rinse BID Conforti, John, DO   15 mL at 05/16/17 0909  . docusate sodium (COLACE) capsule  100 mg  100 mg Oral BID Arnaldo Natal, MD      . feeding supplement (OSMOLITE 1.2 CAL) liquid 1,000 mL  1,000 mL Per Tube Continuous Conforti, John, DO      . free water 100 mL  100 mL Per Tube Q8H Conforti, John, DO      . heparin injection 5,000 Units  5,000 Units Subcutaneous Q8H Arnaldo Natal, MD   5,000 Units at 05/16/17 1335  . hydrALAZINE (APRESOLINE) injection 10-20 mg  10-20 mg Intravenous Q4H PRN Merwyn Katos, MD   15 mg at 05/16/17 1242  . insulin aspart (novoLOG) injection 0-9 Units  0-9 Units Subcutaneous Q6H Arnaldo Natal, MD   1 Units at 05/15/17 1822  . insulin glargine (LANTUS)  injection 10 Units  10 Units Subcutaneous QHS Conforti, John, DO   10 Units at 05/16/17 0000  . levETIRAcetam (KEPPRA) 500 mg in sodium chloride 0.9 % 100 mL IVPB  500 mg Intravenous Q12H Alford Highland, MD   Stopped at 05/16/17 1122  . LORazepam (ATIVAN) injection 0.5-1 mg  0.5-1 mg Intravenous Q4H PRN Alford Highland, MD   1 mg at 05/16/17 1335  . MEDLINE mouth rinse  15 mL Mouth Rinse q12n4p Conforti, John, DO   15 mL at 05/16/17 1136  . morphine 2 MG/ML injection 2-4 mg  2-4 mg Intravenous Q3H PRN Marylou Flesher S, NP   2 mg at 05/16/17 1125  . ondansetron (ZOFRAN) tablet 4 mg  4 mg Oral Q6H PRN Arnaldo Natal, MD       Or  . ondansetron [facility]) injection 4 mg  4 mg Intravenous Q6H PRN Arnaldo Natal, MD      . polyvinyl alcohol (LIQUIFILM TEARS) 1.4 % ophthalmic solution 1 drop  1 drop Both Eyes PRN Conforti, John, DO   1 drop at 05/15/17 1819     Discharge Medications: Please see discharge summary for a list of discharge medications.  Relevant Imaging Results:  Relevant Lab Results:   Additional Information ss: [id number]  York Spaniel, LCSW

## 2017-05-16 NOTE — Progress Notes (Signed)
Pt came to floor at 1800. VSS. Tube feeding resumed per MD order. Seizure pads applied.

## 2017-05-16 NOTE — Progress Notes (Addendum)
12Fr Dobhoff (located in OR by Supply Chain) inserted to 55 cms in patient's right nares by writer, portable ABD ordered to confirm placement.

## 2017-05-16 NOTE — Progress Notes (Signed)
Report called to [facility] on 2C, pt transported to room 218 via 2C bed.  VSS on room air, BP improved with administration of IV metop and norvasc per tube.  Chart and other room items transported with her, no personal belongings in room

## 2017-05-17 LAB — MAGNESIUM: Magnesium: 1.5 mg/dL — ABNORMAL LOW (ref 1.7–2.4)

## 2017-05-17 LAB — PHOSPHORUS: Phosphorus: 2.4 mg/dL — ABNORMAL LOW (ref 2.5–4.6)

## 2017-05-17 LAB — GLUCOSE, CAPILLARY
Glucose-Capillary: 218 mg/dL — ABNORMAL HIGH (ref 65–99)
Glucose-Capillary: 219 mg/dL — ABNORMAL HIGH (ref 65–99)
Glucose-Capillary: 250 mg/dL — ABNORMAL HIGH (ref 65–99)
Glucose-Capillary: 274 mg/dL — ABNORMAL HIGH (ref 65–99)
Glucose-Capillary: 322 mg/dL — ABNORMAL HIGH (ref 65–99)

## 2017-05-17 LAB — POTASSIUM: POTASSIUM: 3.5 mmol/L (ref 3.5–5.1)

## 2017-05-17 MED ORDER — POTASSIUM & SODIUM PHOSPHATES 280-160-250 MG PO PACK
1.0000 | PACK | Freq: Three times a day (TID) | ORAL | Status: AC
  Administered 2017-05-17 (×2): 1
  Filled 2017-05-17 (×3): qty 1

## 2017-05-17 MED ORDER — POTASSIUM & SODIUM PHOSPHATES 280-160-250 MG PO PACK
1.0000 | PACK | Freq: Three times a day (TID) | ORAL | Status: DC
  Filled 2017-05-17 (×3): qty 1

## 2017-05-17 MED ORDER — HALOPERIDOL LACTATE 5 MG/ML IJ SOLN
2.5000 mg | Freq: Four times a day (QID) | INTRAMUSCULAR | Status: DC | PRN
  Administered 2017-05-17 – 2017-05-18 (×3): 2.5 mg via INTRAVENOUS
  Filled 2017-05-17 (×4): qty 0.5

## 2017-05-17 MED ORDER — MAGNESIUM SULFATE 2 GM/50ML IV SOLN
2.0000 g | Freq: Once | INTRAVENOUS | Status: AC
  Administered 2017-05-17: 2 g via INTRAVENOUS
  Filled 2017-05-17: qty 50

## 2017-05-17 MED ORDER — INSULIN GLARGINE 100 UNIT/ML ~~LOC~~ SOLN
14.0000 [IU] | Freq: Every day | SUBCUTANEOUS | Status: DC
  Administered 2017-05-17 – 2017-05-20 (×4): 14 [IU] via SUBCUTANEOUS
  Filled 2017-05-17 (×5): qty 0.14

## 2017-05-17 NOTE — Progress Notes (Signed)
Subjective: Patient has remained altered.  No further seizures.    Objective: Current vital signs: BP (!) 152/57 (BP Location: Right Arm)   Pulse 75   Temp 98.9 F (37.2 C) (Oral)   Resp 18   Ht 5\' 5"  (1.651 m)   Wt 73.2 kg (161 lb 6 oz)   LMP  (LMP Unknown)   SpO2 95%   BMI 26.85 kg/m  Vital signs in last 24 hours: Temp:  [98 F (36.7 C)-99.9 F (37.7 C)] 98.9 F (37.2 C) (03/14 1156) Pulse Rate:  [75-96] 75 (03/14 1156) Resp:  [14-20] 18 (03/14 1156) BP: (152-188)/(57-167) 152/57 (03/14 1156) SpO2:  [92 %-96 %] 95 % (03/14 1156) Weight:  [73.2 kg (161 lb 6 oz)] 73.2 kg (161 lb 6 oz) (03/14 0500)  Intake/Output from previous day: 03/13 0701 - 03/14 0700 In: 300 [NG/GT:200; IV Piggyback:100] Out: 460 [Urine:460] Intake/Output this shift: No intake/output data recorded. Nutritional status: Seizure precautions Aspiration precautions Diet NPO time specified  Neurologic Exam: Mental Status: Eyes open.  Appears to make attempts at speech but markedly dysarthric.  Does not follow commands.   Cranial Nerves: II: Discs flat bilaterally; Does not blink to bilateral confrontation.  Pupils equal, round, reactive to light and accommodation III,IV, VI: Oculocephalic maneuvers intact.   V,VII: Grimace symmetric VIII: unable to test IX,X: unable to test XI: unable to test XII: unable to test Motor: Moves all extremities weakly Sensory: Responds to noxious stimuli throughout Plantars: Right: upgoing                         Left: upgoing    Lab Results: Basic Metabolic Panel: Recent Labs  Lab 05/11/17 0626 05/13/17 0518 05/14/17 0440 05/14/17 0513 05/15/17 0358 05/16/17 1225 05/17/17 0949  NA 140 140 136  --  135 138  --   K 5.0 3.3* 3.2*  --  4.3 3.7 3.5  CL 110 107 106  --  107 102  --   CO2 22 22 18*  --  16* 20*  --   GLUCOSE 218* 132* 84  --  183* 112*  --   BUN 12 9 12   --  10 10  --   CREATININE 0.97 0.88 1.15*  --  0.92 0.79  --   CALCIUM 9.1 8.9 8.2*   --  8.3* 8.7*  --   MG 1.6* 1.4* 1.9  --   --   --  1.5*  PHOS 2.6 4.0  --  3.8  --   --  2.4*    Liver Function Tests: No results for input(s): AST, ALT, ALKPHOS, BILITOT, PROT, ALBUMIN in the last 168 hours. No results for input(s): LIPASE, AMYLASE in the last 168 hours. No results for input(s): AMMONIA in the last 168 hours.  CBC: Recent Labs  Lab 05/14/17 0513  WBC 10.3  NEUTROABS 7.7*  HGB 10.6*  HCT 31.6*  MCV 89.8  PLT 236    Cardiac Enzymes: No results for input(s): CKTOTAL, CKMB, CKMBINDEX, TROPONINI in the last 168 hours.  Lipid Panel: No results for input(s): CHOL, TRIG, HDL, CHOLHDL, VLDL, LDLCALC in the last 168 hours.  CBG: Recent Labs  Lab 05/16/17 1620 05/16/17 1926 05/16/17 2331 05/17/17 0425 05/17/17 0731  GLUCAP 121* 176* 241* 218* 219*    Microbiology: Results for orders placed or performed during the hospital encounter of 05/10/17  MRSA PCR Screening     Status: None   Collection Time: 05/10/17  6:06  AM  Result Value Ref Range Status   MRSA by PCR NEGATIVE NEGATIVE Final    Comment:        The GeneXpert MRSA Assay (FDA approved for NASAL specimens only), is one component of a comprehensive MRSA colonization surveillance program. It is not intended to diagnose MRSA infection nor to guide or monitor treatment for MRSA infections. Performed at [facility], [address] [place], [place] [address]   Culture, blood (Routine X 2) w Reflex to ID Panel     Status: None (Preliminary result)   Collection Time: 05/14/17  5:13 AM  Result Value Ref Range Status   Specimen Description BLOOD RIGHT HAND  Final   Special Requests   Final    BOTTLES DRAWN AEROBIC AND ANAEROBIC Blood Culture results may not be optimal due to an excessive volume of blood received in culture bottles   Culture   Final    NO GROWTH 3 DAYS Performed at [facility], [address] [place], [place] [address]    Report Status PENDING  Incomplete   Culture, blood (Routine X 2) w Reflex to ID Panel     Status: None (Preliminary result)   Collection Time: 05/14/17  5:23 AM  Result Value Ref Range Status   Specimen Description BLOOD LEFT HAND  Final   Special Requests   Final    BOTTLES DRAWN AEROBIC AND ANAEROBIC Blood Culture adequate volume   Culture   Final    NO GROWTH 3 DAYS Performed at [facility], [address] [place], [place] [address]    Report Status PENDING  Incomplete  Urine Culture     Status: None   Collection Time: 05/14/17  6:14 AM  Result Value Ref Range Status   Specimen Description   Final    URINE, RANDOM Performed at [facility], [address] [place], [place] [address]    Special Requests   Final    NONE Performed at [facility], [address], [place], [place] [address]    Culture   Final    NO GROWTH Performed at [facility], [address], [place], [place] [address]    Report Status 05/15/2017 FINAL  Final    Coagulation Studies: No results for input(s): LABPROT, INR in the last 72 hours.  Imaging: Dg Abd Portable 1v  Result Date: 05/16/2017 CLINICAL DATA:  72 year old female with a history of nasogastric tube placement EXAM: PORTABLE ABDOMEN - 1 VIEW COMPARISON:  05/13/2017, 04/02/2017 FINDINGS: Gas within stomach and small bowel, with persistent borderline dilated small bowel. Gas within the right colon with paucity of gas in the left colon. Interval placement of metallic tip enteric feeding tube with coaxial wire, terminating in the stomach. Pacing leads incompletely imaged. Cholecystectomy. Atherosclerotic calcifications IMPRESSION: Weighted tip enteric feeding tube terminates within the stomach. Borderline dilated small bowel loops, potentially ileus. Electronically Signed   By: Gilmer Mor D.O.   On: 05/16/2017 13:03    Medications:  I have reviewed the patient's current medications. Scheduled: . amLODipine  10 mg Per Tube Daily  .  chlorhexidine  15 mL Mouth Rinse BID  . docusate sodium  100 mg Oral BID  . free water  100 mL Per Tube Q8H  . heparin  5,000 Units Subcutaneous Q8H  . insulin aspart  0-9 Units Subcutaneous Q4H  . insulin glargine  10 Units Subcutaneous QHS  . mouth rinse  15 mL Mouth Rinse q12n4p  . metoprolol  tartrate  25 mg Per Tube BID  . potassium & sodium phosphates  1 packet Per Tube TID WC & HS    Assessment/Plan: No further seizures noted.  Seizures likely provoked.  Has remained altered and although I do believe there is likely some underlying brain injury can not rule out side effects from Keppra as well.    Recommendations: 1.  Continue seizure precautions 2.  D/C Keppra. Will not start other anticonvulsants at this time.    3.  Will continue to follow with you    LOS: 7 days   Thana Farr, MD Neurology [phone] 05/17/2017  1:25 PM

## 2017-05-17 NOTE — Progress Notes (Signed)
[facility] [place] [facility]   PATIENT NAME: Melanie King    MR#:  [id number]  DATE OF BIRTH:  1945/03/09  SUBJECTIVE:   Patient remains encephalopathic/lethargic and difficult to understand. Continues to mumble and status post Dobbhoff tube placement yesterday and tube feeding started.  REVIEW OF SYSTEMS:    Review of Systems  Unable to perform ROS: Mental acuity    Nutrition: Tube feedings Tolerating Diet: Yes Tolerating PT: Eval noted.    DRUG ALLERGIES:   Allergies  Allergen Reactions  . Alprazolam Swelling  . Demerol [Meperidine] Itching  . Fentanyl Other (See Comments)    "burning and hot"    VITALS:  Blood pressure (!) 152/57, pulse 75, temperature 98.9 F (37.2 C), temperature source Oral, resp. rate 18, height 5\' 5"  (1.651 m), weight 73.2 kg (161 lb 6 oz), SpO2 95 %.  PHYSICAL EXAMINATION:   Physical Exam  GENERAL:  72 y.o.-year-old patient lying in bed Encephalopathic but follows simple commands.  EYES: Pupils equal, round, reactive to light. No scleral icterus. Extraocular muscles intact.  HEENT: Head atraumatic, normocephalic. Dry Oral Mucosa. Dobhoff tube in place.  NECK:  Supple, no jugular venous distention. No thyroid enlargement, no tenderness.  LUNGS: Poor Resp. effort, no wheezing, rales, rhonchi. No use of accessory muscles of respiration.  CARDIOVASCULAR: S1, S2 normal. No murmurs, rubs, or gallops.  ABDOMEN: Soft, nontender, nondistended. Bowel sounds present. No organomegaly or mass.  EXTREMITIES: No cyanosis, clubbing or edema b/l.    NEUROLOGIC: Cranial nerves II through XII are intact. No focal Motor or sensory deficits b/l. Globally weak and follows simple commands.   PSYCHIATRIC: The patient is alert and oriented x 1.  SKIN: No obvious rash, lesion, or ulcer.    LABORATORY PANEL:   CBC Recent Labs  Lab 05/14/17 0513  WBC 10.3  HGB 10.6*  HCT 31.6*  PLT 236    ------------------------------------------------------------------------------------------------------------------  Chemistries  Recent Labs  Lab 05/16/17 1225 05/17/17 0949  NA 138  --   K 3.7 3.5  CL 102  --   CO2 20*  --   GLUCOSE 112*  --   BUN 10  --   CREATININE 0.79  --   CALCIUM 8.7*  --   MG  --  1.5*   ------------------------------------------------------------------------------------------------------------------  Cardiac Enzymes No results for input(s): TROPONINI in the last 168 hours. ------------------------------------------------------------------------------------------------------------------  RADIOLOGY:  Dg Abd Portable 1v  Result Date: 05/16/2017 CLINICAL DATA:  72 year old female with a history of nasogastric tube placement EXAM: PORTABLE ABDOMEN - 1 VIEW COMPARISON:  05/13/2017, 04/02/2017 FINDINGS: Gas within stomach and small bowel, with persistent borderline dilated small bowel. Gas within the right colon with paucity of gas in the left colon. Interval placement of metallic tip enteric feeding tube with coaxial wire, terminating in the stomach. Pacing leads incompletely imaged. Cholecystectomy. Atherosclerotic calcifications IMPRESSION: Weighted tip enteric feeding tube terminates within the stomach. Borderline dilated small bowel loops, potentially ileus. Electronically Signed   By: Gilmer Mor D.O.   On: 05/16/2017 13:03     ASSESSMENT AND PLAN:   72 year old female past medical history of hypertension, hyperlipidemia, GERD, diabetes, depression, history of CHF, osteoarthritis, status post pacemaker who presented to the hospital due to altered mental status.  1. Altered mental status-etiology unclear but suspected to be secondary to seizures with post ictal delirium. -Patient continues to be delirious and difficult to understand. Now has been weaned off the Precedex drip. Avoid benzodiazepines. Use Haldol as needed for agitation.  Discussed with  neurology and we will also wean off Route to see if this is contributing to her mental status change. - keep awake during the day and sleep at nights.   2. Seizures-suspected to be secondary to patient taking tramadol which can lower seizure threshold. -Discussed with neurology and we'll DC Keppra for now. EEG was negative for seizures.  3. Diabetes type 2 without complication-patient is currently nothing by mouth.  - BS are running a bit high and will advance Lantus, cont. SSI  4. Hypokalemia/Hypomagnesemia-potassium level normal and mg. Was low and will replaced.   5. HTN - cont. Norvasc.    All the records are reviewed and case discussed with Care Management/Social Worker. Management plans discussed with the patient, family and they are in agreement.  CODE STATUS: Full code  DVT Prophylaxis: Hep SQ  TOTAL TIME TAKING CARE OF THIS PATIENT: 25 minutes.   POSSIBLE D/C unclear, and depends of pt's progress  Houston Siren M.D on 05/17/2017 at 2:18 PM  Between 7am to 6pm - Pager - [phone]  After 6pm go to [email or web address] - Social research officer, government  [facility]  Office  [phone]  CC: Primary care physician; Marletta Lor, NP

## 2017-05-17 NOTE — Progress Notes (Signed)
Pharmacy Electrolyte Monitoring Consult:  Pharmacy consulted to assist in monitoring and replacing electrolytes in this 72 y.o. female admitted on 05/10/2017 with Fall AMS, seizure s/p dobbhoff tube   Labs:  Sodium (mmol/L)  Date Value  05/16/2017 138  04/02/2017 143  06/20/2013 136   Potassium (mmol/L)  Date Value  05/17/2017 3.5  06/20/2013 4.7   Magnesium (mg/dL)  Date Value  84/69/6295 1.5 (L)  06/20/2013 1.7 (L)   Phosphorus (mg/dL)  Date Value  28/41/3244 2.4 (L)   Calcium (mg/dL)  Date Value  03/08/7251 8.7 (L)   Calcium, Total (mg/dL)  Date Value  66/44/0347 9.1   Albumin (g/dL)  Date Value  42/59/5638 4.0  04/02/2017 4.4  06/20/2013 3.7    Plan: Pt refeeding slightly- Mg=1.5, phos=2.4 K=3.5 Will give Kphos 1 packet per tube x 3 doses Mg 2g IV once Recheck in the AM  Pharmacy to continue monitoring and replacing electrolytes per protocol.   Olene Floss, Pharm.D, BCPS Clinical Pharmacist  05/17/2017 12:08 PM

## 2017-05-17 NOTE — Progress Notes (Signed)
Received a call from significant other [name] and he was given an update of patient's current status.

## 2017-05-17 NOTE — Progress Notes (Signed)
SLP Cancellation Note  Patient Details Name: Melanie King MRN: [id number] DOB: April 05, 1945   Cancelled treatment:       Reason Eval/Treat Not Completed: Medical issues which prohibited therapy;Fatigue/lethargy limiting ability to participate;Patient not medically ready(chart reviewed; consulted NSG re: pt's status today).  Pt has an NG tube in place now for TFs for nutritional support. Pt continues to present w/ declined Cognitive status; nonverbal but phonated somewhat w/ open-mouth posture - no words/speech. Pt was given Ativan and morphine early this morning per NSG report. Pt is not alert sufficiently for follow through w/ swallowing tasks and exercises currently. Recommended ongoing oral care for hygiene and oral stimulation to promote swallowing; aspiration precautions during oral care. ST services will f/u next 1-2 days for ongoing assessment to hopefully establish an oral diet. NSG agreed.    Jerilynn Som, MS, CCC-SLP [name] 05/17/2017, 1:08 PM

## 2017-05-17 NOTE — Progress Notes (Signed)
Physical Therapy Treatment Patient Details Name: [place] F Monforte MRN: [id number] DOB: 1945-09-24 Today's Date: 05/17/2017    History of Present Illness Pt is a 72 y.o. F who presented to hospital w/ AMS and unwitnessed fall on 05/10/17. Pt admitted 05/10/17 w/ diagnosis of AMS, encephalopathy secondary to seizure ( in ED on 05/10/17), Hypomagnesemia. PMHx includes: CHF, arthritis, DDD cervical & lumbar, depression, DM, GERD, HTN, OA, Pacemaker, tremor.    PT Comments    Pt did not verbalize any words during session but did intermittently make vocalizations when asked questions. Pt with very minimal ability to follow any commands but appeared to try to sit at times on own and also nodded head when therapist asked if she wanted to lay down after sitting for approximately 10 minutes.  Sitting balance: pt varying between close SBA to mod assist for sitting balance (varying between leaning forward, backwards, R, and L).  Will continue to focus on balance and progressive functional per pt tolerance.    Follow Up Recommendations  SNF     Equipment Recommendations  Rolling walker with 5" wheels;3in1 (PT)    Recommendations for Other Services       Precautions / Restrictions Precautions Precautions: Fall;Other (comment) Precaution Comments: NG tube; HOB >30 degrees; aspiration; seizure Restrictions Weight Bearing Restrictions: No    Mobility  Bed Mobility Overal bed mobility: Needs Assistance Bed Mobility: Supine to Sit;Sit to Supine;Rolling Rolling: Max assist   Supine to sit: Max assist;+2 for physical assistance Sit to supine: Max assist;+2 for physical assistance   General bed mobility comments: assist for trunk and B LE's supine to/from sit; max assist logrolling L/R in bed to reposition  Transfers                 General transfer comment: Not appropriate at this time d/t difficulty following commands and assist levels  Ambulation/Gait             General Gait  Details: Not appropriate at this time d/t difficulty following commands and assist levels   Stairs            Wheelchair Mobility    Modified Rankin (Stroke Patients Only)       Balance Overall balance assessment: Needs assistance Sitting-balance support: Feet supported;Bilateral upper extremity supported(pt requiring tactile and vc's to use UE's for support) Sitting balance-Leahy Scale: Poor Sitting balance - Comments: pt varying between close SBA to mod assist for sitting balance (varying between leaning forward, backwards, R, and L)                                    Cognition Arousal/Alertness: Lethargic Behavior During Therapy: Flat affect Overall Cognitive Status: Impaired/Different from baseline Area of Impairment: Orientation;Attention;Following commands;Safety/judgement;Awareness                 Orientation Level: (pt did not verbalize any words during session so difficult to assess orientation) Current Attention Level: Divided   Following Commands: Follows one step commands inconsistently Safety/Judgement: Decreased awareness of safety;Decreased awareness of deficits Awareness: Anticipatory   General Comments: Pt with very minimal ability to follow any commands but appeared to try to sit at times and also nodded head when therapist asked if she wanted to lay down      Exercises      General Comments General comments (skin integrity, edema, etc.): Pt resting in bed upon PT arrival; sitter and pt's  boyfriend present.  Nursing cleared pt for participation in physical therapy.      Pertinent Vitals/Pain Pain Assessment: Faces Faces Pain Scale: No hurt(pt with flat affect; no pain-like behavior noted) Pain Intervention(s): Monitored during session;Repositioned    Home Living                      Prior Function            PT Goals (current goals can now be found in the care plan section) Acute Rehab PT Goals Patient Stated  Goal: to improve mobility (per pt's boyfriend) PT Goal Formulation: Patient unable to participate in goal setting Time For Goal Achievement: 05/31/17 Potential to Achieve Goals: Fair Progress towards PT goals: Progressing toward goals(with sitting balance)    Frequency    Min 2X/week      PT Plan Current plan remains appropriate    Co-evaluation              AM-PAC PT "6 Clicks" Daily Activity  Outcome Measure  Difficulty turning over in bed (including adjusting bedclothes, sheets and blankets)?: Unable Difficulty moving from lying on back to sitting on the side of the bed? : Unable Difficulty sitting down on and standing up from a chair with arms (e.g., wheelchair, bedside commode, etc,.)?: Unable Help needed moving to and from a bed to chair (including a wheelchair)?: Total Help needed walking in hospital room?: Total Help needed climbing 3-5 steps with a railing? : Total 6 Click Score: 6    End of Session Equipment Utilized During Treatment: Other (comment)(NG tube) Activity Tolerance: Patient limited by fatigue;Patient limited by lethargy Patient left: in bed;with call bell/phone within reach;with nursing/sitter in room;with family/visitor present;with SCD's reapplied(B heels elevated via pillows; sitter reporting bed alarm did not need to be activated) Nurse Communication: Mobility status;Precautions PT Visit Diagnosis: Muscle weakness (generalized) (M62.81);Unsteadiness on feet (R26.81);Other abnormalities of gait and mobility (R26.89)     Time: 1130-1153 PT Time Calculation (min) (ACUTE ONLY): 23 min  Charges:  $Therapeutic Activity: 23-37 mins                    G CodesHendricks Limes, PT 05/17/17, 12:57 PM [phone]

## 2017-05-18 ENCOUNTER — Inpatient Hospital Stay: Payer: Medicare Other

## 2017-05-18 LAB — PHOSPHORUS: Phosphorus: 3.8 mg/dL (ref 2.5–4.6)

## 2017-05-18 LAB — GLUCOSE, CAPILLARY
Glucose-Capillary: 189 mg/dL — ABNORMAL HIGH (ref 65–99)
Glucose-Capillary: 250 mg/dL — ABNORMAL HIGH (ref 65–99)
Glucose-Capillary: 259 mg/dL — ABNORMAL HIGH (ref 65–99)
Glucose-Capillary: 262 mg/dL — ABNORMAL HIGH (ref 65–99)
Glucose-Capillary: 319 mg/dL — ABNORMAL HIGH (ref 65–99)
Glucose-Capillary: 330 mg/dL — ABNORMAL HIGH (ref 65–99)

## 2017-05-18 LAB — MAGNESIUM: MAGNESIUM: 1.8 mg/dL (ref 1.7–2.4)

## 2017-05-18 LAB — POTASSIUM: POTASSIUM: 3.3 mmol/L — AB (ref 3.5–5.1)

## 2017-05-18 MED ORDER — RISPERIDONE 0.5 MG PO TBDP
0.5000 mg | ORAL_TABLET | Freq: Two times a day (BID) | ORAL | Status: DC
  Administered 2017-05-18 – 2017-05-21 (×6): 0.5 mg via ORAL
  Filled 2017-05-18 (×8): qty 1

## 2017-05-18 MED ORDER — HALOPERIDOL LACTATE 5 MG/ML IJ SOLN
1.0000 mg | Freq: Once | INTRAMUSCULAR | Status: AC
  Administered 2017-05-18: 1 mg via INTRAVENOUS
  Filled 2017-05-18 (×2): qty 0.2

## 2017-05-18 MED ORDER — ENSURE ENLIVE PO LIQD
237.0000 mL | Freq: Two times a day (BID) | ORAL | Status: DC
  Administered 2017-05-18 – 2017-05-21 (×5): 237 mL via ORAL

## 2017-05-18 MED ORDER — POTASSIUM CHLORIDE 20 MEQ PO PACK
40.0000 meq | PACK | Freq: Once | ORAL | Status: DC
  Filled 2017-05-18: qty 2

## 2017-05-18 NOTE — Progress Notes (Signed)
[facility] - [place] at [facility]   PATIENT NAME: Melanie King    MR#:  [id number]  DATE OF BIRTH:  March 24, 1945  SUBJECTIVE:   Patient is more awake today but remains confused. No other acute events overnight. Seen by speech and tolerated full liquids well. Possibly advanced to dysphagia 1/. Diet today and will pull the Dobbhoff tube.  REVIEW OF SYSTEMS:    Review of Systems  Unable to perform ROS: Mental acuity    Nutrition: Full liquid and will advance Tolerating Diet: Yes Tolerating PT: Eval noted.    DRUG ALLERGIES:   Allergies  Allergen Reactions  . Alprazolam Swelling  . Demerol [Meperidine] Itching  . Fentanyl Other (See Comments)    "burning and hot"    VITALS:  Blood pressure (!) 160/71, pulse 75, temperature 98.6 F (37 C), temperature source Oral, resp. rate 20, height 5\' 5"  (1.651 m), weight 67.5 kg (148 lb 13 oz), SpO2 97 %.  PHYSICAL EXAMINATION:   Physical Exam  GENERAL:  72 y.o.-year-old patient lying in bed Encephalopathic.  EYES: Pupils equal, round, reactive to light. No scleral icterus. Extraocular muscles intact.  HEENT: Head atraumatic, normocephalic. Dry Oral Mucosa. Dobhoff tube in place.  NECK:  Supple, no jugular venous distention. No thyroid enlargement, no tenderness.  LUNGS: Poor Resp. effort, no wheezing, rales, rhonchi. No use of accessory muscles of respiration.  CARDIOVASCULAR: S1, S2 normal. No murmurs, rubs, or gallops.  ABDOMEN: Soft, nontender, nondistended. Bowel sounds present. No organomegaly or mass.  EXTREMITIES: No cyanosis, clubbing or edema b/l.    NEUROLOGIC: Cranial nerves II through XII are intact. No focal Motor or sensory deficits b/l. Globally weak and follows simple commands.   PSYCHIATRIC: The patient is alert and oriented x 1.  SKIN: No obvious rash, lesion, or ulcer.    LABORATORY PANEL:   CBC Recent Labs  Lab 05/14/17 0513  WBC 10.3  HGB 10.6*  HCT 31.6*  PLT 236    ------------------------------------------------------------------------------------------------------------------  Chemistries  Recent Labs  Lab 05/16/17 1225  05/18/17 0607  NA 138  --   --   K 3.7   < > 3.3*  CL 102  --   --   CO2 20*  --   --   GLUCOSE 112*  --   --   BUN 10  --   --   CREATININE 0.79  --   --   CALCIUM 8.7*  --   --   MG  --    < > 1.8   < > = values in this interval not displayed.   ------------------------------------------------------------------------------------------------------------------  Cardiac Enzymes No results for input(s): TROPONINI in the last 168 hours. ------------------------------------------------------------------------------------------------------------------  RADIOLOGY:  Dg Abd Portable 1v  Result Date: 05/18/2017 CLINICAL DATA:  Feeding tube placement EXAM: PORTABLE ABDOMEN - 1 VIEW COMPARISON:  Study obtained earlier in the day FINDINGS: Feeding tube tip is at the level of the pylorus. Bowel gas pattern is normal. Lung bases clear. IMPRESSION: Feeding tube tip at level of pylorus. Bowel gas pattern unremarkable. Electronically Signed   By: Bretta Bang III M.D.   On: 05/18/2017 11:18   Dg Abd Portable 1v  Result Date: 05/18/2017 CLINICAL DATA:  NG tube placement. EXAM: PORTABLE ABDOMEN - 1 VIEW COMPARISON:  Abdominal x-ray dated May 16, 2017. FINDINGS: Weighted feeding tube tip is in the distal esophagus. Nonobstructive bowel gas pattern. IMPRESSION: Weighted feeding tube tip in the distal esophagus. Recommend advancement. Electronically Signed   By: Chrissie Noa  Howell Pringle M.D.   On: 05/18/2017 10:33     ASSESSMENT AND PLAN:   72 year old female past medical history of hypertension, hyperlipidemia, GERD, diabetes, depression, history of CHF, osteoarthritis, status post pacemaker who presented to the hospital due to altered mental status.  1. Altered mental status-etiology unclear but suspected to be secondary to seizures with  post ictal delirium. -Patient continues to be confused but a bit more away today.  Avoid benzodiazepines. Use Haldol as needed for agitation. Discussed with neurology and off Keppra now and mental status improving.  - keep awake during the day and sleep at nights.   2. Seizures-suspected to be secondary to patient taking tramadol which can lower seizure threshold. -Discussed with neurology and we'll DC Keppra for now as could have been contributing to pt's mental status change.  EEG was negative for seizures.  3. Diabetes type 2 without complication-cont. Lantus, SSI and follow BS  4. Hypokalemia/Hypomagnesemia- cont. To replace and follow levels.   5. HTN - cont. Norvasc.   6. Nutrition - pt. Is more awake today and seen by speech and started on a full liquid diet and was advanced to pured with thin liquids today. DC Dobbhoff tube. WE'LL continue to monitor.   All the records are reviewed and case discussed with Care Management/Social Worker. Management plans discussed with the patient, family and they are in agreement.  CODE STATUS: Full code  DVT Prophylaxis: Hep SQ  TOTAL TIME TAKING CARE OF THIS PATIENT: 30 minutes.   POSSIBLE D/C unclear, and depends of pt's progress  Houston Siren M.D on 05/18/2017 at 2:39 PM  Between 7am to 6pm - Pager - [phone]  After 6pm go to [email or web address] - Social research officer, government  [employer] [facility]  Hospitalists  Office  [phone]  CC: Primary care physician; Marletta Lor, NP

## 2017-05-18 NOTE — Progress Notes (Signed)
Inpatient Diabetes Program Recommendations  AACE/ADA: New Consensus Statement on Inpatient Glycemic Control (2015)  Target Ranges:  Prepandial:   less than 140 mg/dL      Peak postprandial:   less than 180 mg/dL (1-2 hours)      Critically ill patients:  140 - 180 mg/dL   Results for Blakney, [place] F (MRN [id number]) as of 05/18/2017 11:24  Ref. Range 05/16/2017 23:31 05/17/2017 04:25 05/17/2017 07:31 05/17/2017 11:30 05/17/2017 16:31 05/17/2017 19:42  Glucose-Capillary Latest Ref Range: 65 - 99 mg/dL 242 (H)  3 units Novolog  218 (H)  3 units Novolog  219 (H)  3 units Novolog  250 (H)  3 units Novolog  274 (H)  5 units Novolog  322 (H)  7 units Novolog +  14 units Lantus    Results for Casady, [place] F (MRN [id number]) as of 05/18/2017 11:24  Ref. Range 05/18/2017 00:02 05/18/2017 03:49 05/18/2017 08:02  Glucose-Capillary Latest Ref Range: 65 - 99 mg/dL 297 (H)  7 units Novolog  262 (H)  5 units Novolog  259 (H)  5 units Novolog     Home DM Meds: Lantus 20 units daily                             Metformin 1000 mg BID   Current Orders: Lantus 14 units QHS       Novolog Sensitive Correction Scale/ SSI (0-9 units) Q4 hours    Note patient getting tube feedings at 60cc/hour.  CBGs consistently >200 mg/dl.  Note Lantus increased to 14 units QHS last PM.  Note that CBGs have been on the rise since the initiation of tube feedings on the afternoon of 03/13.     MD- Please consider the following in-hospital insulin adjustments:  1. Start Novolog tube feed coverage: Novolog 6 units Q4 hours (hold if tube feeds held for any reason)  2. Continue Novolog SSI Q4 hours as well      --Will follow patient during hospitalization--  Ambrose Finland RN, MSN, CDE Diabetes Coordinator Inpatient Glycemic Control Team Team Pager: [phone] (8a-5p)

## 2017-05-18 NOTE — Progress Notes (Signed)
Pharmacy Electrolyte Monitoring Consult:  Pharmacy consulted to assist in monitoring and replacing electrolytes in this 72 y.o. female admitted on 05/10/2017 with Fall AMS, seizure s/p dobbhoff tube   Labs:  Sodium (mmol/L)  Date Value  05/16/2017 138  04/02/2017 143  06/20/2013 136   Potassium (mmol/L)  Date Value  05/18/2017 3.3 (L)  06/20/2013 4.7   Magnesium (mg/dL)  Date Value  81/03/7508 1.8  06/20/2013 1.7 (L)   Phosphorus (mg/dL)  Date Value  25/85/2778 3.8   Calcium (mg/dL)  Date Value  24/23/5361 8.7 (L)   Calcium, Total (mg/dL)  Date Value  44/31/5400 9.1   Albumin (g/dL)  Date Value  86/76/1950 4.0  04/02/2017 4.4  06/20/2013 3.7    Plan: Electrolytes relatively stable, K slightly low Mg=1.8, phos=3.8 K=3.3 KCL packet per tube 40 MEQ once Recheck in the AM  Pharmacy to continue monitoring and replacing electrolytes per protocol.   Olene Floss, Pharm.D, BCPS Clinical Pharmacist  05/18/2017 7:52 AM

## 2017-05-18 NOTE — Progress Notes (Signed)
Speech Language Pathology Treatment: Dysphagia  Patient Details Name: [place] F Glas MRN: [id number] DOB: 1945/04/27 Today's Date: 05/18/2017 Time: 4098-1191 SLP Time Calculation (min) (ACUTE ONLY): 60 min  Assessment / Plan / Recommendation Clinical Impression  Pt appears to present w/ increased alertness/awake state to participate in trials of oral intake. Pt has an NG in place for nutritional support. Pt has been agitated and fidgity d/t declined Cognitive status; mitts placed on hands. Sitter present in room. Pt is verbalizing intermittently today. MD requested assessment to determine if oral intake was appropriate now and if NG could be removed.  Pt appeared to present w/ adequate oral motor function for management of bolus intake w/ no overt oropharyngeal phase dysphagia noted w/ trials given. However, pt's risk for dyspahgia is increased secondary to pt's declined Cognitive status. Pt consumed po trials of thin liquids via cup/straw and purees this assessment w/ no immediate, overt s/s of aspiration noted; no decline in respiratory status or vocal quality. Pt exhibited impulsivity and distractibility when drinking requiring monitoring of bolus amount via verbal/tactile cues. Pt's oral phase appeared grossly wfl for bolus management of the trials given; adequate oral clearing post swallowing. Pt required full assistance in feeding po's. Due to her intermittent confusion, redirection to task was necessary via min verbal/tactile cues. Solids were not given today d/t Cognitive status and dentition status. Recommend a Dysphagia level 1(Puree) w/ thin liquids via cup/straw; monitoring during oral intake for bolus volume(pinch straw) and aspiration precautions including reducing distractions; Pills in Puree - Crushed as able. ST services will continue to follow while admitted; possible trials to upgrade food consistency if appropriate. MD/NSG updated. Recommend more of a full liquid diet consistency if  taking meals while NG is in place - ease of clearing during the pharyngeal-esophageal phases of swallowing/intake. Recommend Dietician f/u for Drink form of nutritional supplements.      HPI HPI: Pt is a 72 y/o female with past medical history of diabetes, hypertension, Depression, GERD, DDD, hyperlipidemia, CAD and CHF (systolic) presents to the emergency department after being found unconscious face down in a pile of her medications.  The patient had open tramadol scattered around her.  Her friends who found her report that she has not seen a doctor for a number of months and cannot keep up with her medications.  They state it is unclear what she is supposed to be on but they have a list of everything found in her home that they will bring for reconciliation with pharmacy.  In the emergency department the patient had a generalized tonic-clonic seizure.  She was given Ativan which made her very somnolent.  Oxygen saturations dropped which prompted placement of nonrebreather mask.  At this time she is maintaining her respiratory effort and oxygen saturations albeit mouth breathing and requiring nonrebreather.  Once it was determined the patient did not need intubation the emergency department staff called the hospitalist service for admission. Patient remains severely encephalopathic, screaming and moaning over the past few days; on Precedex but weaning off currently. Per MD note today, pt w/ acute encephalopathy likely secondary to seizure activity. CT scan of the head was negative for acute stroke, Neurology following, presently on Keppra. Psychiatry following. Pt has remained NPO since admission d/t risk for aspiration sec. to encephalopathy. Pt currently has a NG tube in place for TFs/nutrition. Pt is waking more today and MD would like assessment to determine ability to give nutrition by mouth and remove the NG.  SLP Plan  Continue with current plan of care       Recommendations  Diet  recommendations: Dysphagia 1 (puree);Thin liquid(full liquids until NG is removed) Liquids provided via: Cup;Straw Medication Administration: Crushed with puree(as able or in liquid form) Supervision: Staff to assist with self feeding;Full supervision/cueing for compensatory strategies Compensations: Minimize environmental distractions;Slow rate;Small sips/bites;Lingual sweep for clearance of pocketing;Multiple dry swallows after each bite/sip;Follow solids with liquid Postural Changes and/or Swallow Maneuvers: Seated upright 90 degrees;Upright 30-60 min after meal                General recommendations: (Dietician f/u) Oral Care Recommendations: Oral care BID;Staff/trained caregiver to provide oral care Follow up Recommendations: Skilled Nursing facility SLP Visit Diagnosis: Dysphagia, oropharyngeal phase (R13.12)(Cognitive decline) Plan: Continue with current plan of care       GO                Jerilynn Som, MS, CCC-SLP Niharika Savino 05/18/2017, 4:50 PM

## 2017-05-18 NOTE — Progress Notes (Signed)
Subjective: Patient requires sitter today.  Agitated.  Speech more intelligible.    Objective: Current vital signs: BP (!) 160/71 (BP Location: Right Arm)   Pulse 75   Temp 98.6 F (37 C) (Oral)   Resp 20   Ht 5\' 5"  (1.651 m)   Wt 67.5 kg (148 lb 13 oz)   LMP  (LMP Unknown)   SpO2 97%   BMI 24.76 kg/m  Vital signs in last 24 hours: Temp:  [98.6 F (37 C)] 98.6 F (37 C) (03/15 0525) Pulse Rate:  [75-78] 75 (03/15 0525) Resp:  [18-20] 20 (03/15 0525) BP: (154-160)/(45-71) 160/71 (03/15 0525) SpO2:  [91 %-97 %] 97 % (03/15 0525) Weight:  [67.5 kg (148 lb 13 oz)] 67.5 kg (148 lb 13 oz) (03/15 0415)  Intake/Output from previous day: 03/14 0701 - 03/15 0700 In: 1225 [NG/GT:1175; IV Piggyback:50] Out: 3369 [Urine:2400; Emesis/NG output:969] Intake/Output this shift: No intake/output data recorded. Nutritional status: Seizure precautions Aspiration precautions DIET - DYS 1 Room service appropriate? Yes; Fluid consistency: Thin  Neurologic Exam: Mental Status: Alert, agitated.  At times will follow a command but difficult to reproduce.  Speech able to be understood at times but remains dysarthric.  . Cranial Nerves: II: Discs flat bilaterally;Blinks to bilateral confrontation. Pupils equal, round, reactive to light and accommodation III,IV, ZO:XWRUEAVWUJWJX maneuvers intact. V,VII:Grimace symmetric VIII:unable to test IX,X:unable to test BJ:YNWGNF to test AOZ:HYQMVH to test Motor: Moves all extremities strongly against gravity with no focal weakness noted.  Sensory:Responds to noxious stimuli throughout   Lab Results: Basic Metabolic Panel: Recent Labs  Lab 05/13/17 0518 05/14/17 0440 05/14/17 0513 05/15/17 0358 05/16/17 1225 05/17/17 0949 05/18/17 0607  NA 140 136  --  135 138  --   --   K 3.3* 3.2*  --  4.3 3.7 3.5 3.3*  CL 107 106  --  107 102  --   --   CO2 22 18*  --  16* 20*  --   --   GLUCOSE 132* 84  --  183* 112*  --   --   BUN 9 12  --   10 10  --   --   CREATININE 0.88 1.15*  --  0.92 0.79  --   --   CALCIUM 8.9 8.2*  --  8.3* 8.7*  --   --   MG 1.4* 1.9  --   --   --  1.5* 1.8  PHOS 4.0  --  3.8  --   --  2.4* 3.8    Liver Function Tests: No results for input(s): AST, ALT, ALKPHOS, BILITOT, PROT, ALBUMIN in the last 168 hours. No results for input(s): LIPASE, AMYLASE in the last 168 hours. No results for input(s): AMMONIA in the last 168 hours.  CBC: Recent Labs  Lab 05/14/17 0513  WBC 10.3  NEUTROABS 7.7*  HGB 10.6*  HCT 31.6*  MCV 89.8  PLT 236    Cardiac Enzymes: No results for input(s): CKTOTAL, CKMB, CKMBINDEX, TROPONINI in the last 168 hours.  Lipid Panel: No results for input(s): CHOL, TRIG, HDL, CHOLHDL, VLDL, LDLCALC in the last 168 hours.  CBG: Recent Labs  Lab 05/17/17 1942 05/18/17 0002 05/18/17 0349 05/18/17 0802 05/18/17 1138  GLUCAP 322* 319* 262* 259* 189*    Microbiology: Results for orders placed or performed during the hospital encounter of 05/10/17  MRSA PCR Screening     Status: None   Collection Time: 05/10/17  6:06 AM  Result Value Ref Range  Status   MRSA by PCR NEGATIVE NEGATIVE Final    Comment:        The GeneXpert MRSA Assay (FDA approved for NASAL specimens only), is one component of a comprehensive MRSA colonization surveillance program. It is not intended to diagnose MRSA infection nor to guide or monitor treatment for MRSA infections. Performed at [facility], [address] [place], [place] [address]   Culture, blood (Routine X 2) w Reflex to ID Panel     Status: None (Preliminary result)   Collection Time: 05/14/17  5:13 AM  Result Value Ref Range Status   Specimen Description BLOOD RIGHT HAND  Final   Special Requests   Final    BOTTLES DRAWN AEROBIC AND ANAEROBIC Blood Culture results may not be optimal due to an excessive volume of blood received in culture bottles   Culture   Final    NO GROWTH 4 DAYS Performed at [facility], [address] [place], [place] [address]    Report Status PENDING  Incomplete  Culture, blood (Routine X 2) w Reflex to ID Panel     Status: None (Preliminary result)   Collection Time: 05/14/17  5:23 AM  Result Value Ref Range Status   Specimen Description BLOOD LEFT HAND  Final   Special Requests   Final    BOTTLES DRAWN AEROBIC AND ANAEROBIC Blood Culture adequate volume   Culture   Final    NO GROWTH 4 DAYS Performed at [facility], [address], [place], [place] [address]    Report Status PENDING  Incomplete  Urine Culture     Status: None   Collection Time: 05/14/17  6:14 AM  Result Value Ref Range Status   Specimen Description   Final    URINE, RANDOM Performed at [facility], [address] [place], [place] [address]    Special Requests   Final    NONE Performed at [facility], [address] [place], [place] [address]    Culture   Final    NO GROWTH Performed at [facility], [address] [place], [place] [address]    Report Status 05/15/2017 FINAL  Final    Coagulation Studies: No results for input(s): LABPROT, INR in the last 72 hours.  Imaging: Dg Abd Portable 1v  Result Date: 05/18/2017 CLINICAL DATA:  Feeding tube placement EXAM: PORTABLE ABDOMEN - 1 VIEW COMPARISON:  Study obtained earlier in the day FINDINGS: Feeding tube tip is at the level of the pylorus. Bowel gas pattern is normal. Lung bases clear. IMPRESSION: Feeding tube tip at level of pylorus. Bowel gas pattern unremarkable. Electronically Signed   By: Bretta Bang III M.D.   On: 05/18/2017 11:18   Dg Abd Portable 1v  Result Date: 05/18/2017 CLINICAL DATA:  NG tube placement. EXAM: PORTABLE ABDOMEN - 1 VIEW COMPARISON:  Abdominal x-ray dated May 16, 2017. FINDINGS: Weighted feeding tube tip is in the distal esophagus. Nonobstructive bowel gas pattern. IMPRESSION: Weighted feeding tube tip in the distal esophagus. Recommend advancement.  Electronically Signed   By: Obie Dredge M.D.   On: 05/18/2017 10:33    Medications:  I have reviewed the patient's current medications. Scheduled: . amLODipine  10 mg Per Tube Daily  . chlorhexidine  15 mL Mouth Rinse BID  . docusate sodium  100 mg Oral BID  . feeding supplement (ENSURE ENLIVE)  237 mL Oral BID BM  . free water  100 mL Per Tube  Q8H  . heparin  5,000 Units Subcutaneous Q8H  . insulin aspart  0-9 Units Subcutaneous Q4H  . insulin glargine  14 Units Subcutaneous QHS  . mouth rinse  15 mL Mouth Rinse q12n4p  . metoprolol tartrate  25 mg Per Tube BID  . potassium chloride  40 mEq Per Tube Once  . risperiDONE  0.5 mg Oral BID    Assessment/Plan: Patient more awake and active today. Speech able to be understood at times.  Neurologically somewhat improved but remains significantly impaired.  Will continue to follow with you   LOS: 8 days   Thana Farr, MD Neurology [phone] 05/18/2017  3:16 PM

## 2017-05-18 NOTE — Clinical Social Work Note (Signed)
Patient with sitter currently and is unable to follow direction. Patient is unable to carry on a conversation as well. If patient is unable to participate in PT, she will not be able to be placed for STR. Patient has had bed offers from : [facility], [facility], [facility], [facility]. [facility] is pending at this time.  York Spaniel MSW,LCSW [phone]

## 2017-05-18 NOTE — Progress Notes (Signed)
Foley removed about 1230

## 2017-05-18 NOTE — Plan of Care (Signed)
Pt continues to be very confused and agitated. Pt has a Recruitment consultant at this time

## 2017-05-18 NOTE — Consult Note (Signed)
[facility] Face-to-Face Psychiatry Consult   Reason for Consult: Follow-up consult 72 year old woman status post ICU admission for loss of consciousness.  Patient with ongoing delirium of uncertain etiology Referring Physician: Sainani Patient Identification: Melanie F Kapral MRN:  [id number] Principal Diagnosis: Acute delirium Diagnosis:   Patient Active Problem List   Diagnosis Date Noted  . Acute delirium [R41.0] 05/12/2017  . Seizure (HCC) [R56.9] 05/10/2017  . Elevated sedimentation rate [R70.0] 04/04/2017  . Elevated C-reactive protein (CRP) [R79.82] 04/04/2017  . Chest pain, unspecified [R07.9] 04/02/2017  . Bilateral lower extremity pain (Secondary Area of Pain) (R>L) [W09.811, M79.605] 04/02/2017  . Chronic hip pain, bilateral  (Secondary Area of Pain) (R>L) [M25.551, M25.552, G89.29] 04/02/2017  . Chronic bilateral low back pain with bilateral sciatica [facility] Area of Pain) (R>L) [M54.42, M54.41, G89.29] 04/02/2017  . Chronic pain syndrome [G89.4] 04/02/2017  . Disorder of bone, unspecified [M89.9] 04/02/2017  . Other long term (current) drug therapy [Z79.899] 04/02/2017  . Other specified health status [Z78.9] 04/02/2017  . Long term current use of opiate analgesic [Z79.891] 04/02/2017  . Hip pain, bilateral [M25.551, M25.552] 12/06/2016  . Tremor [R25.1] 05/27/2016  . Trigger finger of left hand [M65.30] 02/16/2016  . Osteoarthritis of both hands [M19.041, M19.042] 02/16/2016  . Angina pectoris (HCC) [I20.9] 01/17/2016  . COPD (chronic obstructive pulmonary disease) (HCC) [J44.9] 01/17/2016  . CAD (coronary artery disease) [I25.10] 01/17/2016  . HLD (hyperlipidemia) [E78.5] 01/17/2016  . Hypertension [I10] 01/17/2016  . Sick sinus syndrome (HCC) [I49.5] 01/17/2016  . Chronic pain [G89.29] 01/17/2016  . Personal history of tobacco use, presenting hazards to health [Z87.891] 11/16/2015  . Diabetes (HCC) [E11.9] 05/12/2013    Total Time spent with patient: 20  minutes  Subjective:   Melanie King is a 72 y.o. female patient admitted with patient not able to give any information.  HPI: See previous notes.  This is a follow-up for this 72 year old woman who came to the hospital after being found passed out at home.  Patient's condition briefly improved but has now declined and remains stably delirious.  Underlying etiology still unclear.  On evaluation today the patient remains in bed needing protective hand covering and needing a one-to-one sitter to prevent her from crawling out of bed or hurting herself.  Patient is vocalizing but difficult to understand.  Does not seem to be oriented to her situation.  Affect appears frightened and agitated.  Past Psychiatric History: Past history of depression distant past history of suicide attempts more recent chronic anxiety.  Risk to Self: Is patient at risk for suicide?: No Risk to Others:   Prior Inpatient Therapy:   Prior Outpatient Therapy:    Past Medical History:  Past Medical History:  Diagnosis Date  . Arthritis   . CHF (congestive heart failure) (HCC)   . Coronary arteriosclerosis   . DDD (degenerative disc disease), cervical   . DDD (degenerative disc disease), lumbar   . Depression   . Diabetes mellitus without complication (HCC)   . GERD (gastroesophageal reflux disease)   . Hyperlipidemia   . Hypertension   . Osteoarthritis   . Pacemaker   . Tremor     Past Surgical History:  Procedure Laterality Date  . ABDOMINAL HYSTERECTOMY    . BREAST BIOPSY Right 1994   neg cyst removed  . CHOLECYSTECTOMY    . EYE SURGERY  1964, 1966, and 1967   bilateral  . FOOT SURGERY Right    cellulitis  . PACEMAKER INSERTION    .  SPINE SURGERY     cyst removed; [facility]   Family History:  Family History  Problem Relation Age of Onset  . Breast cancer Maternal Aunt 40  . Cancer Mother        colon  . Aneurysm Father    Family Psychiatric  History: Unknown Social History:  Social  History   Substance and Sexual Activity  Alcohol Use No     Social History   Substance and Sexual Activity  Drug Use No    Social History   Socioeconomic History  . Marital status: Single    Spouse name: None  . Number of children: None  . Years of education: None  . Highest education level: None  Social Needs  . Financial resource strain: None  . Food insecurity - worry: None  . Food insecurity - inability: None  . Transportation needs - medical: None  . Transportation needs - non-medical: None  Occupational History  . None  Tobacco Use  . Smoking status: Current Every Day Smoker    Packs/day: 0.75    Years: 50.00    Pack years: 37.50    Types: Cigarettes  . Smokeless tobacco: Never Used  Substance and Sexual Activity  . Alcohol use: No  . Drug use: No  . Sexual activity: No  Other Topics Concern  . None  Social History Narrative  . None   Additional Social History:    Allergies:   Allergies  Allergen Reactions  . Alprazolam Swelling  . Demerol [Meperidine] Itching  . Fentanyl Other (See Comments)    "burning and hot"    Labs:  Results for orders placed or performed during the hospital encounter of 05/10/17 (from the past 48 hour(s))  Glucose, capillary     Status: Abnormal   Collection Time: 05/16/17  4:20 PM  Result Value Ref Range   Glucose-Capillary 121 (H) 65 - 99 mg/dL  Glucose, capillary     Status: Abnormal   Collection Time: 05/16/17  7:26 PM  Result Value Ref Range   Glucose-Capillary 176 (H) 65 - 99 mg/dL  Glucose, capillary     Status: Abnormal   Collection Time: 05/16/17 11:31 PM  Result Value Ref Range   Glucose-Capillary 241 (H) 65 - 99 mg/dL  Glucose, capillary     Status: Abnormal   Collection Time: 05/17/17  4:25 AM  Result Value Ref Range   Glucose-Capillary 218 (H) 65 - 99 mg/dL  Glucose, capillary     Status: Abnormal   Collection Time: 05/17/17  7:31 AM  Result Value Ref Range   Glucose-Capillary 219 (H) 65 - 99 mg/dL    Comment 1 Notify RN   Phosphorus     Status: Abnormal   Collection Time: 05/17/17  9:49 AM  Result Value Ref Range   Phosphorus 2.4 (L) 2.5 - 4.6 mg/dL    Comment: Performed at [facility], [address] [place], [place] [address]  Magnesium     Status: Abnormal   Collection Time: 05/17/17  9:49 AM  Result Value Ref Range   Magnesium 1.5 (L) 1.7 - 2.4 mg/dL    Comment: Performed at [facility], [address], [place], [place] [address]  Potassium     Status: None   Collection Time: 05/17/17  9:49 AM  Result Value Ref Range   Potassium 3.5 3.5 - 5.1 mmol/L    Comment: Performed at [facility], [address] [place], [place] [address]  Glucose, capillary  Status: Abnormal   Collection Time: 05/17/17 11:30 AM  Result Value Ref Range   Glucose-Capillary 250 (H) 65 - 99 mg/dL   Comment 1 Notify RN   Glucose, capillary     Status: Abnormal   Collection Time: 05/17/17  4:31 PM  Result Value Ref Range   Glucose-Capillary 274 (H) 65 - 99 mg/dL   Comment 1 Notify RN   Glucose, capillary     Status: Abnormal   Collection Time: 05/17/17  7:42 PM  Result Value Ref Range   Glucose-Capillary 322 (H) 65 - 99 mg/dL  Glucose, capillary     Status: Abnormal   Collection Time: 05/18/17 12:02 AM  Result Value Ref Range   Glucose-Capillary 319 (H) 65 - 99 mg/dL  Glucose, capillary     Status: Abnormal   Collection Time: 05/18/17  3:49 AM  Result Value Ref Range   Glucose-Capillary 262 (H) 65 - 99 mg/dL  Potassium     Status: Abnormal   Collection Time: 05/18/17  6:07 AM  Result Value Ref Range   Potassium 3.3 (L) 3.5 - 5.1 mmol/L    Comment: Performed at [facility], [address], [place], [place] [address]  Magnesium     Status: None   Collection Time: 05/18/17  6:07 AM  Result Value Ref Range   Magnesium 1.8 1.7 - 2.4 mg/dL    Comment: Performed at [facility], [address] [place], [place] [address]  Phosphorus      Status: None   Collection Time: 05/18/17  6:07 AM  Result Value Ref Range   Phosphorus 3.8 2.5 - 4.6 mg/dL    Comment: Performed at [facility], [address] [place], [place] [address]  Glucose, capillary     Status: Abnormal   Collection Time: 05/18/17  8:02 AM  Result Value Ref Range   Glucose-Capillary 259 (H) 65 - 99 mg/dL  Glucose, capillary     Status: Abnormal   Collection Time: 05/18/17 11:38 AM  Result Value Ref Range   Glucose-Capillary 189 (H) 65 - 99 mg/dL   Comment 1 Notify RN     Current Facility-Administered Medications  Medication Dose Route Frequency Provider Last Rate Last Dose  . acetaminophen (TYLENOL) tablet 650 mg  650 mg Oral Q6H PRN Arnaldo Natal, MD       Or  . acetaminophen (TYLENOL) suppository 650 mg  650 mg Rectal Q6H PRN Arnaldo Natal, MD   650 mg at 05/14/17 0451  . amLODipine (NORVASC) tablet 10 mg  10 mg Per Tube Daily Conforti, John, DO   10 mg at 05/18/17 1132  . chlorhexidine (PERIDEX) 0.12 % solution 15 mL  15 mL Mouth Rinse BID Conforti, John, DO   15 mL at 05/17/17 2112  . docusate sodium (COLACE) capsule 100 mg  100 mg Oral BID Arnaldo Natal, MD   100 mg at 05/16/17 2143  . feeding supplement (ENSURE ENLIVE) (ENSURE ENLIVE) liquid 237 mL  237 mL Oral BID BM Houston Siren, MD   237 mL at 05/18/17 1459  . feeding supplement (OSMOLITE 1.2 CAL) liquid 1,000 mL  1,000 mL Per Tube Continuous Houston Siren, MD   Stopped at 05/18/17 1200  . free water 100 mL  100 mL Per Tube Q8H Conforti, John, DO   100 mL at 05/18/17 0537  . haloperidol lactate (HALDOL) injection 1 mg  1 mg Intravenous Once Clapacs, Jackquline Denmark, MD      .  haloperidol lactate (HALDOL) injection 2.5 mg  2.5 mg Intravenous Q6H PRN Houston Siren, MD   2.5 mg at 05/18/17 0940  . heparin injection 5,000 Units  5,000 Units Subcutaneous Q8H Arnaldo Natal, MD   5,000 Units at 05/18/17 0537  . hydrALAZINE (APRESOLINE) injection 10-20 mg  10-20 mg Intravenous  Q4H PRN Merwyn Katos, MD   15 mg at 05/16/17 1242  . insulin aspart (novoLOG) injection 0-9 Units  0-9 Units Subcutaneous Q4H Conforti, John, DO   2 Units at 05/18/17 1150  . insulin glargine (LANTUS) injection 14 Units  14 Units Subcutaneous QHS Houston Siren, MD   14 Units at 05/17/17 2120  . LORazepam (ATIVAN) injection 0.5-1 mg  0.5-1 mg Intravenous Q4H PRN Alford Highland, MD   1 mg at 05/18/17 1238  . MEDLINE mouth rinse  15 mL Mouth Rinse q12n4p Conforti, John, DO   15 mL at 05/18/17 1135  . metoprolol tartrate (LOPRESSOR) tablet 25 mg  25 mg Per Tube BID Conforti, John, DO   25 mg at 05/18/17 1132  . morphine 2 MG/ML injection 2-4 mg  2-4 mg Intravenous Q3H PRN Lewie Loron, NP   2 mg at 05/17/17 0537  . ondansetron (ZOFRAN) tablet 4 mg  4 mg Oral Q6H PRN Arnaldo Natal, MD       Or  . ondansetron [facility]) injection 4 mg  4 mg Intravenous Q6H PRN Arnaldo Natal, MD      . polyvinyl alcohol (LIQUIFILM TEARS) 1.4 % ophthalmic solution 1 drop  1 drop Both Eyes PRN Conforti, John, DO   1 drop at 05/15/17 1819  . potassium chloride (KLOR-CON) packet 40 mEq  40 mEq Per Tube Once Olene Floss, RPH      . risperiDONE (RISPERDAL M-TABS) disintegrating tablet 0.5 mg  0.5 mg Oral BID Clapacs, Jackquline Denmark, MD        Musculoskeletal: Strength & Muscle Tone: decreased Gait & Station: unable to stand Patient leans: N/A  Psychiatric Specialty Exam: Physical Exam  Nursing note and vitals reviewed. Constitutional: She appears well-developed.  HENT:  Head: Normocephalic and atraumatic.  Eyes: Conjunctivae are normal. Pupils are equal, round, and reactive to light. Right eye exhibits discharge. Left eye exhibits discharge.  Neck: Normal range of motion.  Cardiovascular: Regular rhythm and normal heart sounds.  Respiratory: Effort normal.  GI: Soft.  Musculoskeletal: Normal range of motion.  Neurological: She is alert.  Patient with abnormal coordination and have normal  strength in both upper and lower extremities.  Disorganized and delirious behavior.  Skin: Skin is warm and dry.  Psychiatric: Her mood appears anxious. Her affect is labile. She is agitated. She is not aggressive. Cognition and memory are impaired. She expresses impulsivity and inappropriate judgment. She expresses no homicidal and no suicidal ideation. She is noncommunicative.    Review of Systems  Unable to perform ROS: Mental status change    Blood pressure (!) 160/71, pulse 75, temperature 98.6 F (37 C), temperature source Oral, resp. rate 20, height 5\' 5"  (1.651 m), weight 67.5 kg (148 lb 13 oz), SpO2 97 %.Body mass index is 24.76 kg/m.  General Appearance: Disheveled  Eye Contact:  Fair  Speech:  Slurred  Volume:  Decreased  Mood:  Anxious  Affect:  Constricted  Thought Process:  Disorganized  Orientation:  Negative  Thought Content:  Negative  Suicidal Thoughts:  No  Homicidal Thoughts:  No  Memory:  Negative  Judgement:  Negative  Insight:  Negative  Psychomotor Activity:  Restlessness  Concentration:  Concentration: Poor  Recall:  Poor  Fund of Knowledge:  Poor  Language:  Poor  Akathisia:  No  Handed:  Right  AIMS (if indicated):     Assets:  Social Support  ADL's:  Intact  Cognition:  Impaired,  Severe  Sleep:        Treatment Plan Summary: Medication management and Plan 72 year old woman with ongoing delirium of unclear etiology.  Workup has been limited practically.  Patient continues to look like she is suffering a great deal agitated frequently seems to be calling out for her mother.  Not responding well to verbal intervention.  PRN use of IV Haldol seems to be of some help but limited.  I ordered another one-time dose of a lower amount of intravenous Haldol and have also ordered Risperdal initially at 1/2 mg twice a day which can be given orally as the dissolving form will be given through her PEG tube.  I will continue to follow up after the weekend if she  remains in the hospital.  Disposition: Supportive therapy provided about ongoing stressors. See note  Mordecai Rasmussen, MD 05/18/2017 3:57 PM

## 2017-05-19 LAB — CULTURE, BLOOD (ROUTINE X 2)
CULTURE: NO GROWTH
Culture: NO GROWTH
Special Requests: ADEQUATE

## 2017-05-19 LAB — POTASSIUM: Potassium: 3.5 mmol/L (ref 3.5–5.1)

## 2017-05-19 LAB — GLUCOSE, CAPILLARY
Glucose-Capillary: 123 mg/dL — ABNORMAL HIGH (ref 65–99)
Glucose-Capillary: 178 mg/dL — ABNORMAL HIGH (ref 65–99)
Glucose-Capillary: 234 mg/dL — ABNORMAL HIGH (ref 65–99)
Glucose-Capillary: 295 mg/dL — ABNORMAL HIGH (ref 65–99)
Glucose-Capillary: 300 mg/dL — ABNORMAL HIGH (ref 65–99)
Glucose-Capillary: 368 mg/dL — ABNORMAL HIGH (ref 65–99)

## 2017-05-19 LAB — MAGNESIUM: Magnesium: 1.8 mg/dL (ref 1.7–2.4)

## 2017-05-19 LAB — PHOSPHORUS: PHOSPHORUS: 4.2 mg/dL (ref 2.5–4.6)

## 2017-05-19 MED ORDER — TRAZODONE HCL 50 MG PO TABS
50.0000 mg | ORAL_TABLET | Freq: Every day | ORAL | Status: DC
  Administered 2017-05-19 – 2017-05-20 (×2): 50 mg via ORAL
  Filled 2017-05-19 (×2): qty 1

## 2017-05-19 MED ORDER — POTASSIUM CHLORIDE 20 MEQ PO PACK
40.0000 meq | PACK | Freq: Once | ORAL | Status: AC
  Administered 2017-05-19: 40 meq
  Filled 2017-05-19: qty 2

## 2017-05-19 MED ORDER — MAGNESIUM SULFATE 2 GM/50ML IV SOLN
2.0000 g | Freq: Once | INTRAVENOUS | Status: AC
  Administered 2017-05-19: 2 g via INTRAVENOUS
  Filled 2017-05-19: qty 50

## 2017-05-19 NOTE — Therapy (Signed)
SLP Cancellation Note  Patient Details Name: Melanie King MRN: [id number] DOB: 01-08-1946   Cancelled treatment:        SLP consulted with NSG who stated pt is agitated at this time and may refuse trials. NSG states pt is [employer] with current diet at this time and took meds with no distress. ST educated nsg to notify st if changes occur.    [name] 05/19/2017, 11:46 AM

## 2017-05-19 NOTE — Progress Notes (Signed)
Pharmacy Electrolyte Monitoring Consult:  Pharmacy consulted to assist in monitoring and replacing electrolytes in this 72 y.o. female admitted on 05/10/2017 with Fall AMS, seizure s/p dobbhoff tube   Labs:  Sodium (mmol/L)  Date Value  05/16/2017 138  04/02/2017 143  06/20/2013 136   Potassium (mmol/L)  Date Value  05/19/2017 3.5  06/20/2013 4.7   Magnesium (mg/dL)  Date Value  57/84/6962 1.8  06/20/2013 1.7 (L)   Phosphorus (mg/dL)  Date Value  95/28/4132 4.2   Calcium (mg/dL)  Date Value  44/03/270 8.7 (L)   Calcium, Total (mg/dL)  Date Value  53/66/4403 9.1   Albumin (g/dL)  Date Value  47/42/5956 4.0  04/02/2017 4.4  06/20/2013 3.7    Plan: Mg = 1.8. Will order magnesium 2 g IV once given history of seizure to target Mg > 2. K = 3.5 is on low end of range, ordered KCl 40 mEq PO once. Phos = 4.2 is WNL.  Pharmacy to continue monitoring and replacing electrolytes per protocol.   Cindi Carbon, Pharm.D, BCPS Clinical Pharmacist 05/19/2017 8:54 AM

## 2017-05-19 NOTE — Progress Notes (Signed)
Subjective: Patient awake and alert today. Speech moe understandable.  Able to ambulate with nursing.  Remains agitated.    Objective: Current vital signs: BP (!) 118/59 (BP Location: Right Arm)   Pulse 86   Temp 98.1 F (36.7 C) (Oral)   Resp 20   Ht 5\' 5"  (1.651 m)   Wt 67.5 kg (148 lb 12.8 oz)   LMP  (LMP Unknown)   SpO2 98%   BMI 24.76 kg/m  Vital signs in last 24 hours: Temp:  [97.5 F (36.4 C)-98.1 F (36.7 C)] 98.1 F (36.7 C) (03/16 1031) Pulse Rate:  [79-86] 86 (03/16 1031) Resp:  [20-24] 20 (03/16 1031) BP: (118-188)/(52-63) 118/59 (03/16 1031) SpO2:  [97 %-98 %] 98 % (03/16 0407) Weight:  [67.5 kg (148 lb 12.8 oz)] 67.5 kg (148 lb 12.8 oz) (03/16 0407)  Intake/Output from previous day: No intake/output data recorded. Intake/Output this shift: Total I/O In: 360 [P.O.:360] Out: -  Nutritional status: Seizure precautions Aspiration precautions DIET - DYS 1 Room service appropriate? Yes with Assist; Fluid consistency: Thin  Neurologic Exam: Mental Status: Alert, agitated.  Follows some simple commands but requires reinforcement.  Speech less dysarthric.  Fluent.   Cranial Nerves: II: Discs flat bilaterally;Blinks to bilateral confrontation. Pupils equal, round, reactive to light and accommodation III,IV, FW:YOVZ ptosis.  EOMI V,VII:Grimace symmetric VIII:grossly intact IX,X:positive gag CH:[id number] shoulder shrug OIN:OMVEHM to test Motor: Moves all extremities strongly against gravity with no focal weakness noted.  Sensory:Responds to noxious stimuli throughout   Lab Results: Basic Metabolic Panel: Recent Labs  Lab 05/13/17 0518 05/14/17 0440 05/14/17 0513 05/15/17 0358 05/16/17 1225 05/17/17 0949 05/18/17 0607 05/19/17 0556  NA 140 136  --  135 138  --   --   --   K 3.3* 3.2*  --  4.3 3.7 3.5 3.3* 3.5  CL 107 106  --  107 102  --   --   --   CO2 22 18*  --  16* 20*  --   --   --   GLUCOSE 132* 84  --  183* 112*  --   --   --    BUN 9 12  --  10 10  --   --   --   CREATININE 0.88 1.15*  --  0.92 0.79  --   --   --   CALCIUM 8.9 8.2*  --  8.3* 8.7*  --   --   --   MG 1.4* 1.9  --   --   --  1.5* 1.8 1.8  PHOS 4.0  --  3.8  --   --  2.4* 3.8 4.2    Liver Function Tests: No results for input(s): AST, ALT, ALKPHOS, BILITOT, PROT, ALBUMIN in the last 168 hours. No results for input(s): LIPASE, AMYLASE in the last 168 hours. No results for input(s): AMMONIA in the last 168 hours.  CBC: Recent Labs  Lab 05/14/17 0513  WBC 10.3  NEUTROABS 7.7*  HGB 10.6*  HCT 31.6*  MCV 89.8  PLT 236    Cardiac Enzymes: No results for input(s): CKTOTAL, CKMB, CKMBINDEX, TROPONINI in the last 168 hours.  Lipid Panel: No results for input(s): CHOL, TRIG, HDL, CHOLHDL, VLDL, LDLCALC in the last 168 hours.  CBG: Recent Labs  Lab 05/18/17 1954 05/19/17 0001 05/19/17 0403 05/19/17 0741 05/19/17 1137  GLUCAP 330* 368* 178* 123* 234*    Microbiology: Results for orders placed or performed during the hospital encounter of 05/10/17  MRSA PCR Screening     Status: None   Collection Time: 05/10/17  6:06 AM  Result Value Ref Range Status   MRSA by PCR NEGATIVE NEGATIVE Final    Comment:        The GeneXpert MRSA Assay (FDA approved for NASAL specimens only), is one component of a comprehensive MRSA colonization surveillance program. It is not intended to diagnose MRSA infection nor to guide or monitor treatment for MRSA infections. Performed at [facility], [address] [place], [place] [address]   Culture, blood (Routine X 2) w Reflex to ID Panel     Status: None   Collection Time: 05/14/17  5:13 AM  Result Value Ref Range Status   Specimen Description BLOOD RIGHT HAND  Final   Special Requests   Final    BOTTLES DRAWN AEROBIC AND ANAEROBIC Blood Culture results may not be optimal due to an excessive volume of blood received in culture bottles   Culture   Final    NO GROWTH 5 DAYS Performed at  [facility], [address] [place], [place] [address]    Report Status 05/19/2017 FINAL  Final  Culture, blood (Routine X 2) w Reflex to ID Panel     Status: None   Collection Time: 05/14/17  5:23 AM  Result Value Ref Range Status   Specimen Description BLOOD LEFT HAND  Final   Special Requests   Final    BOTTLES DRAWN AEROBIC AND ANAEROBIC Blood Culture adequate volume   Culture   Final    NO GROWTH 5 DAYS Performed at [facility], [address] [place], [place] [address]    Report Status 05/19/2017 FINAL  Final  Urine Culture     Status: None   Collection Time: 05/14/17  6:14 AM  Result Value Ref Range Status   Specimen Description   Final    URINE, RANDOM Performed at [facility], [address] [place], [place] [address]    Special Requests   Final    NONE Performed at [facility], [address], [place], [place] [address]    Culture   Final    NO GROWTH Performed at [facility], [address] [place], [place] [address]    Report Status 05/15/2017 FINAL  Final    Coagulation Studies: No results for input(s): LABPROT, INR in the last 72 hours.  Imaging: Dg Abd Portable 1v  Result Date: 05/18/2017 CLINICAL DATA:  Feeding tube placement EXAM: PORTABLE ABDOMEN - 1 VIEW COMPARISON:  Study obtained earlier in the day FINDINGS: Feeding tube tip is at the level of the pylorus. Bowel gas pattern is normal. Lung bases clear. IMPRESSION: Feeding tube tip at level of pylorus. Bowel gas pattern unremarkable. Electronically Signed   By: Bretta Bang III M.D.   On: 05/18/2017 11:18   Dg Abd Portable 1v  Result Date: 05/18/2017 CLINICAL DATA:  NG tube placement. EXAM: PORTABLE ABDOMEN - 1 VIEW COMPARISON:  Abdominal x-ray dated May 16, 2017. FINDINGS: Weighted feeding tube tip is in the distal esophagus. Nonobstructive bowel gas pattern. IMPRESSION: Weighted feeding tube tip in the distal esophagus. Recommend advancement.  Electronically Signed   By: Obie Dredge M.D.   On: 05/18/2017 10:33    Medications:  I have reviewed the patient's current medications. Scheduled: . amLODipine  10 mg Per Tube Daily  . chlorhexidine  15 mL Mouth Rinse BID  . docusate sodium  100 mg Oral BID  .  feeding supplement (ENSURE ENLIVE)  237 mL Oral BID BM  . heparin  5,000 Units Subcutaneous Q8H  . insulin aspart  0-9 Units Subcutaneous Q4H  . insulin glargine  14 Units Subcutaneous QHS  . mouth rinse  15 mL Mouth Rinse q12n4p  . metoprolol tartrate  25 mg Per Tube BID  . potassium chloride  40 mEq Per Tube Once  . risperiDONE  0.5 mg Oral BID    Assessment/Plan: Patient somewhat better than yesterday but remains agitated and confused.  Medication regimen being simplified.  Will continue to follow with you.     LOS: 9 days   Thana Farr, MD Neurology [phone] 05/19/2017  11:51 AM

## 2017-05-19 NOTE — Progress Notes (Signed)
Patient ID: Melanie F Amador, female   DOB: [birth year], 72 y.o.   MRN: [id number]  [facility] PROGRESS NOTE  Melanie King PFX:[id number] DOB: 1945/10/11 DOA: 05/10/2017 PCP: Marletta Lor, NP  HPI/Subjective: Nurse called me to come see her because she was getting agitated.  She was able to speak with me about things in the past.  Unable to focus her on questions about her and right now.  Objective: Vitals:   05/19/17 0407 05/19/17 1031  BP: (!) 188/63 (!) 118/59  Pulse: 81 86  Resp: (!) 24 20  Temp: (!) 97.5 F (36.4 C) 98.1 F (36.7 C)  SpO2: 98%     Filed Weights   05/17/17 0500 05/18/17 0415 05/19/17 0407  Weight: 73.2 kg (161 lb 6 oz) 67.5 kg (148 lb 13 oz) 67.5 kg (148 lb 12.8 oz)    ROS: Review of Systems  Unable to perform ROS: Mental acuity   Exam: Physical Exam  HENT:  Nose: No mucosal edema.  Mouth/Throat: No oropharyngeal exudate.  Eyes: Conjunctivae are normal. Pupils are equal, round, and reactive to light.  Neck: No JVD present. Carotid bruit is not present. No thyromegaly present.  Cardiovascular: Regular rhythm, S1 normal and S2 normal.  Pulses:      Dorsalis pedis pulses are 1+ on the right side, and 1+ on the left side.  Respiratory: She has no decreased breath sounds. She has no wheezes. She has no rhonchi. She has no rales.  GI: Soft. Bowel sounds are normal. There is no tenderness.  Musculoskeletal:       Right ankle: She exhibits no swelling.       Left ankle: She exhibits no swelling.  Neurological: She is alert.  Able to move all of her extremities on her own.  Skin: Skin is warm. No rash noted. Nails show no clubbing.  Psychiatric: She is agitated.      Data Reviewed: Basic Metabolic Panel: Recent Labs  Lab 05/13/17 0518 05/14/17 0440 05/14/17 0513 05/15/17 0358 05/16/17 1225 05/17/17 0949 05/18/17 0607 05/19/17 0556  NA 140 136  --  135 138  --   --   --   K 3.3* 3.2*  --  4.3 3.7 3.5 3.3* 3.5  CL 107 106  --  107 102  --    --   --   CO2 22 18*  --  16* 20*  --   --   --   GLUCOSE 132* 84  --  183* 112*  --   --   --   BUN 9 12  --  10 10  --   --   --   CREATININE 0.88 1.15*  --  0.92 0.79  --   --   --   CALCIUM 8.9 8.2*  --  8.3* 8.7*  --   --   --   MG 1.4* 1.9  --   --   --  1.5* 1.8 1.8  PHOS 4.0  --  3.8  --   --  2.4* 3.8 4.2   CBC: Recent Labs  Lab 05/14/17 0513  WBC 10.3  NEUTROABS 7.7*  HGB 10.6*  HCT 31.6*  MCV 89.8  PLT 236    CBG: Recent Labs  Lab 05/18/17 1954 05/19/17 0001 05/19/17 0403 05/19/17 0741 05/19/17 1137  GLUCAP 330* 368* 178* 123* 234*    Recent Results (from the past 240 hour(s))  MRSA PCR Screening     Status: None   Collection Time:  05/10/17  6:06 AM  Result Value Ref Range Status   MRSA by PCR NEGATIVE NEGATIVE Final    Comment:        The GeneXpert MRSA Assay (FDA approved for NASAL specimens only), is one component of a comprehensive MRSA colonization surveillance program. It is not intended to diagnose MRSA infection nor to guide or monitor treatment for MRSA infections. Performed at [facility], [address] [place], [place] [address]   Culture, blood (Routine X 2) w Reflex to ID Panel     Status: None   Collection Time: 05/14/17  5:13 AM  Result Value Ref Range Status   Specimen Description BLOOD RIGHT HAND  Final   Special Requests   Final    BOTTLES DRAWN AEROBIC AND ANAEROBIC Blood Culture results may not be optimal due to an excessive volume of blood received in culture bottles   Culture   Final    NO GROWTH 5 DAYS Performed at [facility], [address], [place], [place] [address]    Report Status 05/19/2017 FINAL  Final  Culture, blood (Routine X 2) w Reflex to ID Panel     Status: None   Collection Time: 05/14/17  5:23 AM  Result Value Ref Range Status   Specimen Description BLOOD LEFT HAND  Final   Special Requests   Final    BOTTLES DRAWN AEROBIC AND ANAEROBIC Blood Culture adequate volume   Culture    Final    NO GROWTH 5 DAYS Performed at [facility], [address] [place], [place] [address]    Report Status 05/19/2017 FINAL  Final  Urine Culture     Status: None   Collection Time: 05/14/17  6:14 AM  Result Value Ref Range Status   Specimen Description   Final    URINE, RANDOM Performed at [facility], [address] [place], [place] [address]    Special Requests   Final    NONE Performed at [facility], [address], [place], [place] [address]    Culture   Final    NO GROWTH Performed at [facility], [address] [place], [place] [address]    Report Status 05/15/2017 FINAL  Final     Studies: Dg Abd Portable 1v  Result Date: 05/18/2017 CLINICAL DATA:  Feeding tube placement EXAM: PORTABLE ABDOMEN - 1 VIEW COMPARISON:  Study obtained earlier in the day FINDINGS: Feeding tube tip is at the level of the pylorus. Bowel gas pattern is normal. Lung bases clear. IMPRESSION: Feeding tube tip at level of pylorus. Bowel gas pattern unremarkable. Electronically Signed   By: Bretta Bang III M.D.   On: 05/18/2017 11:18   Dg Abd Portable 1v  Result Date: 05/18/2017 CLINICAL DATA:  NG tube placement. EXAM: PORTABLE ABDOMEN - 1 VIEW COMPARISON:  Abdominal x-ray dated May 16, 2017. FINDINGS: Weighted feeding tube tip is in the distal esophagus. Nonobstructive bowel gas pattern. IMPRESSION: Weighted feeding tube tip in the distal esophagus. Recommend advancement. Electronically Signed   By: Obie Dredge M.D.   On: 05/18/2017 10:33    Scheduled Meds: . amLODipine  10 mg Per Tube Daily  . chlorhexidine  15 mL Mouth Rinse BID  . docusate sodium  100 mg Oral BID  . feeding supplement (ENSURE ENLIVE)  237 mL Oral BID BM  . heparin  5,000 Units Subcutaneous Q8H  . insulin aspart  0-9 Units Subcutaneous Q4H  . insulin glargine  14 Units  Subcutaneous QHS  . mouth rinse  15 mL Mouth Rinse q12n4p  . metoprolol tartrate  25 mg Per Tube  BID  . potassium chloride  40 mEq Per Tube Once  . risperiDONE  0.5 mg Oral BID  . traZODone  50 mg Oral QHS   Continuous Infusions:  Assessment/Plan:  1. Acute delirium.  Patient on Risperdal and as needed Haldol.  The patient did not sleep last night I will and trazodone.  Patient now off Keppra and mental status is improving. 2. Seizure.  Likely from taking tramadol.  Patient now off seizure medication as per neurology. 3. Type 2 diabetes mellitus on Lantus and sliding scale insulin. 4. Hypokalemia and hypomagnesemia.  These have been replaced during the hospital course 5. Essential hypertension on Norvasc 6. Nutrition.  Patient placed on dysphagia diet  Code Status:     Code Status Orders  (From admission, onward)        Start     Ordered   05/10/17 0603  Full code  Continuous     05/10/17 0602    Code Status History    Date Active Date Inactive Code Status Order ID Comments User Context   03/28/2017 22:02 03/30/2017 21:57 Full Code [id number]  Oralia Manis, MD Inpatient     Disposition Plan: Will have to have a sitter off for 24 hours prior to disposition out to a rehab facility.  Right now I do not think she can live without being supervised.  Consultants:  Neurology  Time spent: 24 minutes  Jahquan Klugh [employer]

## 2017-05-20 LAB — BASIC METABOLIC PANEL
Anion gap: 10 (ref 5–15)
BUN: 18 mg/dL (ref 6–20)
CHLORIDE: 104 mmol/L (ref 101–111)
CO2: 25 mmol/L (ref 22–32)
Calcium: 9.1 mg/dL (ref 8.9–10.3)
Creatinine, Ser: 0.97 mg/dL (ref 0.44–1.00)
GFR calc Af Amer: >60 mL/min (ref >60)
GFR calc non Af Amer: 57 mL/min — ABNORMAL LOW (ref >60)
Glucose, Bld: 143 mg/dL — ABNORMAL HIGH (ref 65–99)
POTASSIUM: 4.1 mmol/L (ref 3.5–5.1)
SODIUM: 139 mmol/L (ref 135–145)

## 2017-05-20 LAB — CBC
HEMATOCRIT: 34.1 % — AB (ref 35.0–47.0)
HEMOGLOBIN: 11.3 g/dL — AB (ref 12.0–16.0)
MCH: 29.4 pg (ref 26.0–34.0)
MCHC: 33.1 g/dL (ref 32.0–36.0)
MCV: 88.8 fL (ref 80.0–100.0)
Platelets: 351 10*3/uL (ref 150–440)
RBC: 3.84 MIL/uL (ref 3.80–5.20)
RDW: 14.8 % — ABNORMAL HIGH (ref 11.5–14.5)
WBC: 10.3 10*3/uL (ref 3.6–11.0)

## 2017-05-20 LAB — GLUCOSE, CAPILLARY
Glucose-Capillary: 178 mg/dL — ABNORMAL HIGH (ref 65–99)
Glucose-Capillary: 144 mg/dL — ABNORMAL HIGH (ref 65–99)
Glucose-Capillary: 106 mg/dL — ABNORMAL HIGH (ref 65–99)
Glucose-Capillary: 232 mg/dL — ABNORMAL HIGH (ref 65–99)
Glucose-Capillary: 310 mg/dL — ABNORMAL HIGH (ref 65–99)
Glucose-Capillary: 345 mg/dL — ABNORMAL HIGH (ref 65–99)

## 2017-05-20 LAB — MAGNESIUM: MAGNESIUM: 2 mg/dL (ref 1.7–2.4)

## 2017-05-20 MED ORDER — MAGNESIUM OXIDE 400 (241.3 MG) MG PO TABS
400.0000 mg | ORAL_TABLET | Freq: Every day | ORAL | Status: DC
  Administered 2017-05-20 – 2017-05-21 (×2): 400 mg via ORAL
  Filled 2017-05-20 (×2): qty 1

## 2017-05-20 MED ORDER — ALUM & MAG HYDROXIDE-SIMETH 200-200-20 MG/5ML PO SUSP
30.0000 mL | Freq: Four times a day (QID) | ORAL | Status: DC | PRN
  Administered 2017-05-20: 30 mL via ORAL
  Filled 2017-05-20: qty 30

## 2017-05-20 NOTE — Progress Notes (Signed)
Patient ID: Melanie King, female   DOB: 12/02/1945, 72 y.o.   MRN: [id number]  [facility] PROGRESS NOTE  Melanie King LJQ:[id number] DOB: 1945/04/10 DOA: 05/10/2017 PCP: Marletta Lor, NP  HPI/Subjective: Patient able to follow commands better today.  Lifts her legs up off the bed straight leg raise to command.  Lifts her arms up in the air.  Able to focus a little bit better and answer a few questions.  Objective: Vitals:   05/20/17 0925 05/20/17 1231  BP: (!) 116/52 (!) 120/51  Pulse: 69 70  Resp:    Temp: 99.2 F (37.3 C) 97.9 F (36.6 C)  SpO2: 100% 97%    Filed Weights   05/17/17 0500 05/18/17 0415 05/19/17 0407  Weight: 73.2 kg (161 lb 6 oz) 67.5 kg (148 lb 13 oz) 67.5 kg (148 lb 12.8 oz)    ROS: Review of Systems  Unable to perform ROS: Mental acuity  Respiratory: Negative for shortness of breath.   Cardiovascular: Negative for chest pain.  Gastrointestinal: Negative for abdominal pain.  Genitourinary: Negative for dysuria.   Exam: Physical Exam  HENT:  Nose: No mucosal edema.  Mouth/Throat: No oropharyngeal exudate.  Eyes: Conjunctivae are normal. Pupils are equal, round, and reactive to light.  Neck: No JVD present. Carotid bruit is not present. No thyromegaly present.  Cardiovascular: Regular rhythm, S1 normal and S2 normal.  Pulses:      Dorsalis pedis pulses are 1+ on the right side, and 1+ on the left side.  Respiratory: She has no decreased breath sounds. She has no wheezes. She has no rhonchi. She has no rales.  GI: Soft. Bowel sounds are normal. There is no tenderness.  Musculoskeletal:       Right ankle: She exhibits no swelling.       Left ankle: She exhibits no swelling.  Neurological: She is alert.  Able to move all of her extremities to my command.  Skin: Skin is warm. No rash noted. Nails show no clubbing.  Psychiatric: She has a normal mood and affect.      Data Reviewed: Basic Metabolic Panel: Recent Labs  Lab 05/14/17 0440  05/14/17 0513 05/15/17 0358 05/16/17 1225 05/17/17 0949 05/18/17 0607 05/19/17 0556 05/20/17 0443  NA 136  --  135 138  --   --   --  139  K 3.2*  --  4.3 3.7 3.5 3.3* 3.5 4.1  CL 106  --  107 102  --   --   --  104  CO2 18*  --  16* 20*  --   --   --  25  GLUCOSE 84  --  183* 112*  --   --   --  143*  BUN 12  --  10 10  --   --   --  18  CREATININE 1.15*  --  0.92 0.79  --   --   --  0.97  CALCIUM 8.2*  --  8.3* 8.7*  --   --   --  9.1  MG 1.9  --   --   --  1.5* 1.8 1.8 2.0  PHOS  --  3.8  --   --  2.4* 3.8 4.2  --    CBC: Recent Labs  Lab 05/14/17 0513 05/20/17 0443  WBC 10.3 10.3  NEUTROABS 7.7*  --   HGB 10.6* 11.3*  HCT 31.6* 34.1*  MCV 89.8 88.8  PLT 236 351    CBG: Recent Labs  Lab 05/19/17 2020 05/20/17 0111 05/20/17 0430 05/20/17 0721 05/20/17 1129  GLUCAP 300* 178* 144* 106* 232*    Recent Results (from the past 240 hour(s))  Culture, blood (Routine X 2) w Reflex to ID Panel     Status: None   Collection Time: 05/14/17  5:13 AM  Result Value Ref Range Status   Specimen Description BLOOD RIGHT HAND  Final   Special Requests   Final    BOTTLES DRAWN AEROBIC AND ANAEROBIC Blood Culture results may not be optimal due to an excessive volume of blood received in culture bottles   Culture   Final    NO GROWTH 5 DAYS Performed at [facility], [address], [place], [place] [address]    Report Status 05/19/2017 FINAL  Final  Culture, blood (Routine X 2) w Reflex to ID Panel     Status: None   Collection Time: 05/14/17  5:23 AM  Result Value Ref Range Status   Specimen Description BLOOD LEFT HAND  Final   Special Requests   Final    BOTTLES DRAWN AEROBIC AND ANAEROBIC Blood Culture adequate volume   Culture   Final    NO GROWTH 5 DAYS Performed at [facility], [address], [place], [place] [address]    Report Status 05/19/2017 FINAL  Final  Urine Culture     Status: None   Collection Time: 05/14/17  6:14 AM  Result Value  Ref Range Status   Specimen Description   Final    URINE, RANDOM Performed at [facility], [address] [place], [place] [address]    Special Requests   Final    NONE Performed at [facility], [address] [place], [place] [address]    Culture   Final    NO GROWTH Performed at [facility], [address], [place], [place] [address]    Report Status 05/15/2017 FINAL  Final      Scheduled Meds: . amLODipine  10 mg Per Tube Daily  . chlorhexidine  15 mL Mouth Rinse BID  . docusate sodium  100 mg Oral BID  . feeding supplement (ENSURE ENLIVE)  237 mL Oral BID BM  . heparin  5,000 Units Subcutaneous Q8H  . insulin aspart  0-9 Units Subcutaneous Q4H  . insulin glargine  14 Units Subcutaneous QHS  . magnesium oxide  400 mg Oral Daily  . mouth rinse  15 mL Mouth Rinse q12n4p  . metoprolol tartrate  25 mg Per Tube BID  . potassium chloride  40 mEq Per Tube Once  . risperiDONE  0.5 mg Oral BID  . traZODone  50 mg Oral QHS    Assessment/Plan:  1. Acute delirium.  Patient on Risperdal and as needed Haldol.  I added trazodone last night so the patient can sleep and with a good night sleep her mental status seems improved today.  I asked the nurse to evaluate on whether we can get rid of the sitter at this point. 2. Seizure.  Likely from taking tramadol.  Patient now off seizure medication as per neurology. 3. Type 2 diabetes mellitus on Lantus and sliding scale insulin. 4. Hypokalemia and hypomagnesemia.  These have been replaced during the hospital course 5. Essential hypertension on Norvasc 6. Nutrition.  Patient placed on dysphagia diet  Code Status:     Code Status Orders  (From admission, onward)        Start     Ordered  05/10/17 0603  Full code  Continuous     05/10/17 0602    Code Status History    Date Active Date Inactive Code Status Order ID Comments User Context   03/28/2017 22:02 03/30/2017 21:57 Full Code [id number]  Oralia Manis, MD Inpatient     Disposition Plan: Significant other would like to take the patient home.  Right now physical therapy recommends rehab.  Now since the patient's mental status is better hopefully we can get a better estimate on her strength.  Consultants:  Neurology  Time spent: 25 minutes.  Significant other at the bedside.  Madysen Faircloth [employer]

## 2017-05-20 NOTE — Progress Notes (Signed)
Per MD okay for RN to DC sitter. If pt is calm and not agitated.  Pt sleep through the night and this morning has been calm. Sitter is being DC at this time. Will continue to assess and monitor pt.

## 2017-05-20 NOTE — Progress Notes (Signed)
Pharmacy Electrolyte Monitoring Consult:  Pharmacy consulted to assist in monitoring and replacing electrolytes in this 72 y.o. female admitted on 05/10/2017 with Fall AMS, seizure s/p dobbhoff tube   Labs:  Sodium (mmol/L)  Date Value  05/20/2017 139  04/02/2017 143  06/20/2013 136   Potassium (mmol/L)  Date Value  05/20/2017 4.1  06/20/2013 4.7   Magnesium (mg/dL)  Date Value  27/05/5007 2.0  06/20/2013 1.7 (L)   Phosphorus (mg/dL)  Date Value  38/18/2993 4.2   Calcium (mg/dL)  Date Value  71/69/6789 9.1   Calcium, Total (mg/dL)  Date Value  38/12/1749 9.1   Albumin (g/dL)  Date Value  02/58/5277 4.0  04/02/2017 4.4  06/20/2013 3.7    Plan: Electrolytes are WNL and stable. Ordered mag-ox 400 mg PO daily as Mg has been <2 throughout admission. Will sign off.   Cindi Carbon, Pharm.D, BCPS Clinical Pharmacist 05/20/2017 8:50 AM

## 2017-05-21 DIAGNOSIS — R569 Unspecified convulsions: Secondary | ICD-10-CM | POA: Diagnosis not present

## 2017-05-21 DIAGNOSIS — Z5189 Encounter for other specified aftercare: Secondary | ICD-10-CM | POA: Diagnosis not present

## 2017-05-21 DIAGNOSIS — F329 Major depressive disorder, single episode, unspecified: Secondary | ICD-10-CM | POA: Diagnosis not present

## 2017-05-21 DIAGNOSIS — M6281 Muscle weakness (generalized): Secondary | ICD-10-CM | POA: Diagnosis not present

## 2017-05-21 DIAGNOSIS — R262 Difficulty in walking, not elsewhere classified: Secondary | ICD-10-CM | POA: Diagnosis not present

## 2017-05-21 DIAGNOSIS — G8929 Other chronic pain: Secondary | ICD-10-CM | POA: Diagnosis not present

## 2017-05-21 DIAGNOSIS — E876 Hypokalemia: Secondary | ICD-10-CM | POA: Diagnosis not present

## 2017-05-21 DIAGNOSIS — I2510 Atherosclerotic heart disease of native coronary artery without angina pectoris: Secondary | ICD-10-CM | POA: Diagnosis not present

## 2017-05-21 DIAGNOSIS — I10 Essential (primary) hypertension: Secondary | ICD-10-CM | POA: Diagnosis not present

## 2017-05-21 DIAGNOSIS — E119 Type 2 diabetes mellitus without complications: Secondary | ICD-10-CM | POA: Diagnosis not present

## 2017-05-21 DIAGNOSIS — R4182 Altered mental status, unspecified: Secondary | ICD-10-CM | POA: Diagnosis not present

## 2017-05-21 DIAGNOSIS — K210 Gastro-esophageal reflux disease with esophagitis: Secondary | ICD-10-CM | POA: Diagnosis not present

## 2017-05-21 DIAGNOSIS — R451 Restlessness and agitation: Secondary | ICD-10-CM | POA: Diagnosis not present

## 2017-05-21 DIAGNOSIS — R410 Disorientation, unspecified: Secondary | ICD-10-CM | POA: Diagnosis not present

## 2017-05-21 DIAGNOSIS — L89151 Pressure ulcer of sacral region, stage 1: Secondary | ICD-10-CM | POA: Diagnosis not present

## 2017-05-21 DIAGNOSIS — E039 Hypothyroidism, unspecified: Secondary | ICD-10-CM | POA: Diagnosis not present

## 2017-05-21 DIAGNOSIS — R1312 Dysphagia, oropharyngeal phase: Secondary | ICD-10-CM | POA: Diagnosis not present

## 2017-05-21 DIAGNOSIS — F3289 Other specified depressive episodes: Secondary | ICD-10-CM | POA: Diagnosis not present

## 2017-05-21 DIAGNOSIS — R41841 Cognitive communication deficit: Secondary | ICD-10-CM | POA: Diagnosis not present

## 2017-05-21 LAB — GLUCOSE, CAPILLARY
Glucose-Capillary: 176 mg/dL — ABNORMAL HIGH (ref 65–99)
Glucose-Capillary: 233 mg/dL — ABNORMAL HIGH (ref 65–99)
Glucose-Capillary: 265 mg/dL — ABNORMAL HIGH (ref 65–99)
Glucose-Capillary: 308 mg/dL — ABNORMAL HIGH (ref 65–99)

## 2017-05-21 MED ORDER — INSULIN ASPART 100 UNIT/ML ~~LOC~~ SOLN: 4.0000 [IU] | Freq: Three times a day (TID) | SUBCUTANEOUS | 0 refills | Status: DC

## 2017-05-21 MED ORDER — ACETAMINOPHEN 325 MG PO TABS: 650.0000 mg | ORAL_TABLET | Freq: Four times a day (QID) | ORAL | Status: DC | PRN

## 2017-05-21 MED ORDER — MAGNESIUM OXIDE 400 (241.3 MG) MG PO TABS: 400.0000 mg | ORAL_TABLET | Freq: Every day | ORAL | 0 refills | Status: DC

## 2017-05-21 MED ORDER — INSULIN GLARGINE 100 UNIT/ML ~~LOC~~ SOLN: 14.0000 [IU] | Freq: Every day | SUBCUTANEOUS | 0 refills | Status: DC

## 2017-05-21 MED ORDER — ENSURE ENLIVE PO LIQD: 237.0000 mL | Freq: Two times a day (BID) | ORAL | 0 refills | Status: DC

## 2017-05-21 MED ORDER — RISPERIDONE 0.5 MG PO TBDP
0.5000 mg | ORAL_TABLET | Freq: Two times a day (BID) | ORAL | 0 refills | Status: DC
Start: 2017-05-21 — End: 2019-03-10

## 2017-05-21 MED ORDER — LEVOTHYROXINE SODIUM 50 MCG PO TABS: 50.0000 ug | ORAL_TABLET | Freq: Every day | ORAL | 0 refills | Status: DC

## 2017-05-21 MED ORDER — TRAZODONE HCL 50 MG PO TABS: 50.0000 mg | ORAL_TABLET | Freq: Every day | ORAL | 0 refills | Status: DC

## 2017-05-21 NOTE — Progress Notes (Signed)
Melanie King  A and O x 2. VSS. Pt tolerating diet well. No complaints of pain or nausea. IV removed intact, prescriptions given. Report called to [facility]. Pt given discharge instructions with no further questions. Pt discharged via wheelchair with nurse aide. Pt boyfriend Melanie King to transport to facility.   Melanie Feil MSN, RN-BC  Allergies as of 05/21/2017      Reactions   Alprazolam Swelling   Demerol [meperidine] Itching   Fentanyl Other (See Comments)   "burning and hot"      Medication List    STOP taking these medications   aspirin 81 MG EC tablet   benztropine 1 MG tablet Commonly known as:  COGENTIN   citalopram 20 MG tablet Commonly known as:  CELEXA   etodolac 200 MG capsule Commonly known as:  LODINE   furosemide 20 MG tablet Commonly known as:  LASIX   gabapentin 300 MG capsule Commonly known as:  NEURONTIN   LANTUS SOLOSTAR 100 UNIT/ML Solostar Pen Generic drug:  Insulin Glargine Replaced by:  insulin glargine 100 UNIT/ML injection   losartan 100 MG tablet Commonly known as:  COZAAR   metFORMIN 500 MG 24 hr tablet Commonly known as:  GLUCOPHAGE-XR   montelukast 10 MG tablet Commonly known as:  SINGULAIR   nitroGLYCERIN 0.2 mg/hr patch Commonly known as:  NITRODUR - Dosed in mg/24 hr   nitroGLYCERIN 0.4 MG SL tablet Commonly known as:  NITROSTAT   primidone 50 MG tablet Commonly known as:  MYSOLINE   ranitidine 150 MG tablet Commonly known as:  ZANTAC   REXULTI 1 MG Tabs Generic drug:  Brexpiprazole   rosuvastatin 20 MG tablet Commonly known as:  CRESTOR   traMADol 50 MG tablet Commonly known as:  ULTRAM     TAKE these medications   acetaminophen 325 MG tablet Commonly known as:  TYLENOL Take 2 tablets (650 mg total) by mouth every 6 (six) hours as needed for mild pain (or Fever >/= 101).   albuterol 108 (90 Base) MCG/ACT inhaler Commonly known as:  PROVENTIL HFA;VENTOLIN HFA Inhale 2 puffs into the lungs every 6 (six)  hours as needed for wheezing or shortness of breath.   amLODipine 10 MG tablet Commonly known as:  NORVASC TAKE 1 TABLET BY MOUTH EVERY DAY   docusate sodium 100 MG capsule Commonly known as:  COLACE TAKE 1 CAPSULE BY MOUTH EVERY DAY   feeding supplement (ENSURE ENLIVE) Liqd Take 237 mLs by mouth 2 (two) times daily between meals.   fluticasone 50 MCG/ACT nasal spray Commonly known as:  FLONASE Place 1 spray into both nostrils daily.   insulin aspart 100 UNIT/ML injection Commonly known as:  novoLOG Inject 4 Units into the skin 3 (three) times daily with meals.   insulin glargine 100 UNIT/ML injection Commonly known as:  LANTUS Inject 0.14 mLs (14 Units total) into the skin at bedtime. Replaces:  LANTUS SOLOSTAR 100 UNIT/ML Solostar Pen   levothyroxine 50 MCG tablet Commonly known as:  SYNTHROID Take 1 tablet (50 mcg total) by mouth daily. What changed:    medication strength  how much to take   magnesium oxide 400 (241.3 Mg) MG tablet Commonly known as:  MAG-OX Take 1 tablet (400 mg total) by mouth daily. Start taking on:  05/22/2017   metoprolol tartrate 25 MG tablet Commonly known as:  LOPRESSOR Take 25 mg by mouth 2 (two) times daily.   risperiDONE 0.5 MG disintegrating tablet Commonly known as:  RISPERDAL M-TABS Take  1 tablet (0.5 mg total) by mouth 2 (two) times daily.   traZODone 50 MG tablet Commonly known as:  DESYREL Take 1 tablet (50 mg total) by mouth at bedtime.   TRUETRACK TEST test strip Generic drug:  glucose blood USE TO CHECK BLOOD SUGAR LEVEL ONCE DAILY. DX CODE E11.65   ULTICARE SHORT PEN NEEDLES 31G X 8 MM Misc Generic drug:  Insulin Pen Needle   Vitamin D (Ergocalciferol) 50000 units Caps capsule Commonly known as:  DRISDOL TAKE ONE CAPSULE BY MOUTH MONTHLY       Vitals:   05/21/17 0850 05/21/17 1012  BP:  127/60  Pulse: 70 70  Resp:    Temp:    SpO2: 97%

## 2017-05-21 NOTE — Discharge Summary (Signed)
[facility] [place] [facility]   PATIENT NAME: Melanie King    MR#:  [id number]  DATE OF BIRTH:  Feb 02, 1946  DATE OF ADMISSION:  05/10/2017 ADMITTING PHYSICIAN: Arnaldo Natal, MD  DATE OF DISCHARGE: 05/21/2017  PRIMARY CARE PHYSICIAN: Marletta Lor, NP    ADMISSION DIAGNOSIS:  Generalized tonic-clonic seizure (HCC) [G40.409]  DISCHARGE DIAGNOSIS:  Principal Problem:   Acute delirium Active Problems:   Seizure (HCC)   SECONDARY DIAGNOSIS:   Past Medical History:  Diagnosis Date  . Arthritis   . CHF (congestive heart failure) (HCC)   . Coronary arteriosclerosis   . DDD (degenerative disc disease), cervical   . DDD (degenerative disc disease), lumbar   . Depression   . Diabetes mellitus without complication (HCC)   . GERD (gastroesophageal reflux disease)   . Hyperlipidemia   . Hypertension   . Osteoarthritis   . Pacemaker   . Tremor     HOSPITAL COURSE:   1.  Acute delirium.  Patient was admitted initially to the ICU with acute encephalopathy.  This was thought to be post ictal after a seizure.  The patient was started on IV Keppra.  The patient continued to have altered mental status for a long period of time.  Neurology stop the IV Keppra and believe that the seizure was brought on from the tramadol that she was taking at home.  Mental status has improved.  She was seen by psychiatry and placed on risperidone.  I also placed on trazodone to get her sleeping at night. 2.  Seizure disorder likely from trach and tramadol.  Patient now off seizure medications as per neurology. 3.  Type 2 diabetes mellitus on low-dose Lantus and insulin prior to meals. 4.  Hypokalemia and hypomagnesemia these have been replaced during the hospital course 5.  Essential hypertension on Norvasc and metoprolol 6.  Hypothyroidism unspecified.  Patient's TSH is elevated at 39.  I believe this was secondary to noncompliance at home.  Start low-dose Synthroid.  Recheck TFTs  in 6 weeks. 7.  Patient on dysphagia 1 diet with thin liquids 8.  Weakness.  Physical therapy recommends rehab.  My concern for her is if she is able to take her medications as directed at home and be able to take care of herself.  DISCHARGE CONDITIONS:   Fair  CONSULTS OBTAINED:  Treatment Team:  Thana Farr, MD Erin Fulling, MD Clapacs, Jackquline Denmark, MD  DRUG ALLERGIES:   Allergies  Allergen Reactions  . Alprazolam Swelling  . Demerol [Meperidine] Itching  . Fentanyl Other (See Comments)    "burning and hot"    DISCHARGE MEDICATIONS:   Allergies as of 05/21/2017      Reactions   Alprazolam Swelling   Demerol [meperidine] Itching   Fentanyl Other (See Comments)   "burning and hot"      Medication List    STOP taking these medications   aspirin 81 MG EC tablet   benztropine 1 MG tablet Commonly known as:  COGENTIN   citalopram 20 MG tablet Commonly known as:  CELEXA   etodolac 200 MG capsule Commonly known as:  LODINE   furosemide 20 MG tablet Commonly known as:  LASIX   gabapentin 300 MG capsule Commonly known as:  NEURONTIN   LANTUS SOLOSTAR 100 UNIT/ML Solostar Pen Generic drug:  Insulin Glargine Replaced by:  insulin glargine 100 UNIT/ML injection   losartan 100 MG tablet Commonly known as:  COZAAR   metFORMIN 500 MG 24  hr tablet Commonly known as:  GLUCOPHAGE-XR   montelukast 10 MG tablet Commonly known as:  SINGULAIR   nitroGLYCERIN 0.2 mg/hr patch Commonly known as:  NITRODUR - Dosed in mg/24 hr   nitroGLYCERIN 0.4 MG SL tablet Commonly known as:  NITROSTAT   primidone 50 MG tablet Commonly known as:  MYSOLINE   ranitidine 150 MG tablet Commonly known as:  ZANTAC   REXULTI 1 MG Tabs Generic drug:  Brexpiprazole   rosuvastatin 20 MG tablet Commonly known as:  CRESTOR   traMADol 50 MG tablet Commonly known as:  ULTRAM     TAKE these medications   acetaminophen 325 MG tablet Commonly known as:  TYLENOL Take 2 tablets (650  mg total) by mouth every 6 (six) hours as needed for mild pain (or Fever >/= 101).   albuterol 108 (90 Base) MCG/ACT inhaler Commonly known as:  PROVENTIL HFA;VENTOLIN HFA Inhale 2 puffs into the lungs every 6 (six) hours as needed for wheezing or shortness of breath.   amLODipine 10 MG tablet Commonly known as:  NORVASC TAKE 1 TABLET BY MOUTH EVERY DAY   docusate sodium 100 MG capsule Commonly known as:  COLACE TAKE 1 CAPSULE BY MOUTH EVERY DAY   feeding supplement (ENSURE ENLIVE) Liqd Take 237 mLs by mouth 2 (two) times daily between meals.   fluticasone 50 MCG/ACT nasal spray Commonly known as:  FLONASE Place 1 spray into both nostrils daily.   insulin aspart 100 UNIT/ML injection Commonly known as:  novoLOG Inject 4 Units into the skin 3 (three) times daily with meals.   insulin glargine 100 UNIT/ML injection Commonly known as:  LANTUS Inject 0.14 mLs (14 Units total) into the skin at bedtime. Replaces:  LANTUS SOLOSTAR 100 UNIT/ML Solostar Pen   levothyroxine 50 MCG tablet Commonly known as:  SYNTHROID Take 1 tablet (50 mcg total) by mouth daily. What changed:    medication strength  how much to take   magnesium oxide 400 (241.3 Mg) MG tablet Commonly known as:  MAG-OX Take 1 tablet (400 mg total) by mouth daily. Start taking on:  05/22/2017   metoprolol tartrate 25 MG tablet Commonly known as:  LOPRESSOR Take 25 mg by mouth 2 (two) times daily.   risperiDONE 0.5 MG disintegrating tablet Commonly known as:  RISPERDAL M-TABS Take 1 tablet (0.5 mg total) by mouth 2 (two) times daily.   traZODone 50 MG tablet Commonly known as:  DESYREL Take 1 tablet (50 mg total) by mouth at bedtime.   TRUETRACK TEST test strip Generic drug:  glucose blood USE TO CHECK BLOOD SUGAR LEVEL ONCE DAILY. DX CODE E11.65   ULTICARE SHORT PEN NEEDLES 31G X 8 MM Misc Generic drug:  Insulin Pen Needle   Vitamin D (Ergocalciferol) 50000 units Caps capsule Commonly known as:   DRISDOL TAKE ONE CAPSULE BY MOUTH MONTHLY        DISCHARGE INSTRUCTIONS:   Follow-up with Dr. At rehab 1 day  If you experience worsening of your admission symptoms, develop shortness of breath, life threatening emergency, suicidal or homicidal thoughts you must seek medical attention immediately by calling 911 or calling your MD immediately  if symptoms less severe.  You Must read complete instructions/literature along with all the possible adverse reactions/side effects for all the Medicines you take and that have been prescribed to you. Take any new Medicines after you have completely understood and accept all the possible adverse reactions/side effects.   Please note  You were cared for by  a hospitalist during your hospital stay. If you have any questions about your discharge medications or the care you received while you were in the hospital after you are discharged, you can call the unit and asked to speak with the hospitalist on call if the hospitalist that took care of you is not available. Once you are discharged, your primary care physician will handle any further medical issues. Please note that NO REFILLS for any discharge medications will be authorized once you are discharged, as it is imperative that you return to your primary care physician (or establish a relationship with a primary care physician if you do not have one) for your aftercare needs so that they can reassess your need for medications and monitor your lab values.    Today   CHIEF COMPLAINT:   Chief Complaint  Patient presents with  . Fall    HISTORY OF PRESENT ILLNESS:  Melanie King  is a 72 y.o. female came in with altered mental status   VITAL SIGNS:  Blood pressure 127/60, pulse 70, temperature 98.7 F (37.1 C), temperature source Oral, resp. rate 16, height 5\' 5"  (1.651 m), weight 64 kg (141 lb 1.6 oz), SpO2 99 %.    PHYSICAL EXAMINATION:  GENERAL:  72 y.o.-year-old patient lying in the bed  with no acute distress.  EYES: Pupils equal, round, reactive to light and accommodation. No scleral icterus.  HEENT: Head atraumatic, normocephalic. Oropharynx and nasopharynx clear.  NECK:  Supple, no jugular venous distention. No thyroid enlargement, no tenderness.  LUNGS: Normal breath sounds bilaterally, no wheezing, rales,rhonchi or crepitation. No use of accessory muscles of respiration.  CARDIOVASCULAR: S1, S2 normal. No murmurs, rubs, or gallops.  ABDOMEN: Soft, non-tender, non-distended. Bowel sounds present. No organomegaly or mass.  EXTREMITIES: No pedal edema, cyanosis, or clubbing.  NEUROLOGIC: Cranial nerves II through XII are intact. Muscle strength 5/5 in all extremities. Sensation intact. Gait not checked.  PSYCHIATRIC: The patient is alert and answers some questions.  SKIN: No obvious rash, lesion, or ulcer.   DATA REVIEW:   CBC Recent Labs  Lab 05/20/17 0443  WBC 10.3  HGB 11.3*  HCT 34.1*  PLT 351    Chemistries  Recent Labs  Lab 05/20/17 0443  NA 139  K 4.1  CL 104  CO2 25  GLUCOSE 143*  BUN 18  CREATININE 0.97  CALCIUM 9.1  MG 2.0     Microbiology Results  Results for orders placed or performed during the hospital encounter of 05/10/17  MRSA PCR Screening     Status: None   Collection Time: 05/10/17  6:06 AM  Result Value Ref Range Status   MRSA by PCR NEGATIVE NEGATIVE Final    Comment:        The GeneXpert MRSA Assay (FDA approved for NASAL specimens only), is one component of a comprehensive MRSA colonization surveillance program. It is not intended to diagnose MRSA infection nor to guide or monitor treatment for MRSA infections. Performed at [facility], [address] [place], [place] [address]   Culture, blood (Routine X 2) w Reflex to ID Panel     Status: None   Collection Time: 05/14/17  5:13 AM  Result Value Ref Range Status   Specimen Description BLOOD RIGHT HAND  Final   Special Requests   Final    BOTTLES  DRAWN AEROBIC AND ANAEROBIC Blood Culture results may not be optimal due to an excessive volume of blood received in culture bottles   Culture  Final    NO GROWTH 5 DAYS Performed at [facility], [address] [place], [place] [address]    Report Status 05/19/2017 FINAL  Final  Culture, blood (Routine X 2) w Reflex to ID Panel     Status: None   Collection Time: 05/14/17  5:23 AM  Result Value Ref Range Status   Specimen Description BLOOD LEFT HAND  Final   Special Requests   Final    BOTTLES DRAWN AEROBIC AND ANAEROBIC Blood Culture adequate volume   Culture   Final    NO GROWTH 5 DAYS Performed at [facility], [address] [place], [place] [address]    Report Status 05/19/2017 FINAL  Final  Urine Culture     Status: None   Collection Time: 05/14/17  6:14 AM  Result Value Ref Range Status   Specimen Description   Final    URINE, RANDOM Performed at [facility], [address], [place], [place] [address]    Special Requests   Final    NONE Performed at [facility], [address], [place], [place] [address]    Culture   Final    NO GROWTH Performed at [facility], [address] [place], [place] [address]    Report Status 05/15/2017 FINAL  Final     Management plans discussed with the patient, family and they are in agreement.  CODE STATUS:     Code Status Orders  (From admission, onward)        Start     Ordered   05/10/17 0603  Full code  Continuous     05/10/17 0602    Code Status History    Date Active Date Inactive Code Status Order ID Comments User Context   03/28/2017 22:02 03/30/2017 21:57 Full Code [id number]  Oralia Manis, MD Inpatient      TOTAL TIME TAKING CARE OF THIS PATIENT: 35 minutes.    Alford Highland M.D on 05/21/2017 at 11:14 AM  Between 7am to 6pm - Pager - [phone]  After 6pm go to [email or web address] - password [email or web address]  [employer] Office  [phone]  CC: Primary  care physician; Marletta Lor, NP

## 2017-05-21 NOTE — Progress Notes (Signed)
Physical Therapy Treatment Patient Details Name: Melanie King MRN: [id number] DOB: [birth year] Today's Date: 05/21/2017    History of Present Illness Pt is a 72 y.o. F who presented to hospital w/ AMS and unwitnessed fall on 05/10/17. Pt admitted 05/10/17 w/ diagnosis of AMS, encephalopathy secondary to seizure ( in ED on 05/10/17), Hypomagnesemia. PMHx includes: CHF, arthritis, DDD cervical & lumbar, depression, DM, GERD, HTN, OA, Pacemaker, tremor.    PT Comments    Pt demonstrated bed mobility CGA for safety; transfers and ambulation w/ RW and CGA. Pt demonstates overall improvement in strength, transfers, ambulation, and cognition.  However, Pt demonstrates some gait deviations with ambulation (see ambulation for details).  Pt would benefit from continued PT to address above deficits and promote optimal return to PLOF.  Recommend transition to HHPT with 24/7 supervision/assistance upon discharge from acute hospitalization.    Follow Up Recommendations  Home health PT;Supervision/Assistance - 24 hour     Equipment Recommendations  Rolling walker with 5" wheels    Recommendations for Other Services       Precautions / Restrictions Precautions Precautions: Fall;Other (comment) Precaution Comments: HOB >30 degrees; aspiration; seizure Restrictions Weight Bearing Restrictions: No    Mobility  Bed Mobility Overal bed mobility: Modified Independent Bed Mobility: Supine to Sit;Sit to Supine     Supine to sit: Min guard Sit to supine: Min guard   General bed mobility comments: Pt required CGA for supine to sit and sit to supine w/ functional vc's to "sit up" or "lie back down".  Transfers Overall transfer level: Needs assistance Equipment used: Rolling walker (2 wheeled) Transfers: Sit to/from Stand Sit to Stand: Min guard         General transfer comment: Pt demonstrated sit to stand with CGA and verbal functional cueing to "stand up".  Ambulation/Gait Ambulation/Gait  assistance: Min guard Ambulation Distance (Feet): 40 Feet Assistive device: Rolling walker (2 wheeled) Gait Pattern/deviations: Step-through pattern;Decreased step length - right;Decreased step length - left;Drifts right/left;Narrow base of support Gait velocity: decreased   General Gait Details: Pt demonstrates excessive toe out on LLE.    Stairs            Wheelchair Mobility    Modified Rankin (Stroke Patients Only)       Balance Overall balance assessment: Needs assistance Sitting-balance support: No upper extremity supported Sitting balance-Leahy Scale: Good Sitting balance - Comments: Pt demonstrated good sitting balance at EOB when managing clothing reaching within BOS   Standing balance support: Bilateral upper extremity supported Standing balance-Leahy Scale: Fair Standing balance comment: Pt demonstrated fair standing balance w/ RW and CGA prior to ambulation.                            Cognition Arousal/Alertness: Awake/alert Behavior During Therapy: WFL for tasks assessed/performed Overall Cognitive Status: Within Functional Limits for tasks assessed                         Following Commands: Follows multi-step commands inconsistently;Follows one step commands consistently       General Comments: Pt responds well to functional verbal ceuing and visual demonstration.      Exercises      General Comments        Pertinent Vitals/Pain Pain Assessment: 0-10 Pain Score: 5  Pain Location: Pt reports she hurts all over. Pain Descriptors / Indicators: Sore Pain Intervention(s): Limited activity within patient's tolerance;Monitored during  session    Home Living                      Prior Function            PT Goals (current goals can now be found in the care plan section) Acute Rehab PT Goals Patient Stated Goal: I want to go home. PT Goal Formulation: With patient Time For Goal Achievement: 05/31/17 Potential  to Achieve Goals: Good Progress towards PT goals: Progressing toward goals    Frequency    Min 2X/week      PT Plan Discharge plan needs to be updated    Co-evaluation              AM-PAC PT "6 Clicks" Daily Activity  Outcome Measure  Difficulty turning over in bed (including adjusting bedclothes, sheets and blankets)?: A Little Difficulty moving from lying on back to sitting on the side of the bed? : A Little Difficulty sitting down on and standing up from a chair with arms (e.g., wheelchair, bedside commode, etc,.)?: A Little Help needed moving to and from a bed to chair (including a wheelchair)?: A Little Help needed walking in hospital room?: A Little Help needed climbing 3-5 steps with a railing? : A Little 6 Click Score: 18    End of Session Equipment Utilized During Treatment: Gait belt Activity Tolerance: Patient limited by fatigue Patient left: in chair;with call bell/phone within reach;with family/visitor present;with chair alarm set(Boyfriend in room. SCD's not on patient upon arrival.) Nurse Communication: Mobility status(RN notified Pt reported global pain 5/10.) PT Visit Diagnosis: Muscle weakness (generalized) (M62.81);Difficulty in walking, not elsewhere classified (R26.2);Pain Pain - Right/Left: (global pain) Pain - part of body: (global pain)     Time: 4098-1191 PT Time Calculation (min) (ACUTE ONLY): 24 min  Charges:                       G Codes:        Aracelis Ulrey Mondrian-Pardue, SPT 05/21/2017, 12:15 PM

## 2017-05-21 NOTE — Clinical Social Work Placement (Signed)
   CLINICAL SOCIAL WORK PLACEMENT  NOTE  Date:  05/21/2017  Patient Details  Name: [place] F Rials MRN: [id number] Date of Birth: 26-Feb-1946  Clinical Social Work is seeking post-discharge placement for this patient at the Skilled  Nursing Facility level of care (*CSW will initial, date and re-position this form in  chart as items are completed):  Yes   Patient/family provided with [facility] Social [facility] Department's list of facilities offering this level of care within the geographic area requested by the patient (or if unable, by the patient's family).  Yes   Patient/family informed of their freedom to choose among providers that offer the needed level of care, that participate in Medicare, Medicaid or managed care program needed by the patient, have an available bed and are willing to accept the patient.  Yes   Patient/family informed of 's ownership interest in [facility] and [facility], as well as of the fact that they are under no obligation to receive care at these facilities.  PASRR submitted to EDS on 05/15/17     PASRR number received on 05/17/17     Existing PASRR number confirmed on       FL2 transmitted to all facilities in geographic area requested by pt/family on 05/21/17     FL2 transmitted to all facilities within larger geographic area on       Patient informed that his/her managed care company has contracts with or will negotiate with certain facilities, including the following:        Yes   Patient/family informed of bed offers received.  Patient chooses bed at [facility])     Physician recommends and patient chooses bed at [facility])    Patient to be transferred to ([facility]) on 05/21/17.  Patient to be transferred to facility by (EMS)     Patient family notified on 05/21/17 of transfer.  Name of family member notified:  (nephew: B.J.)     PHYSICIAN       Additional Comment:     _______________________________________________ York Spaniel, LCSW 05/21/2017, 11:34 AM

## 2017-05-21 NOTE — Clinical Social Work Note (Addendum)
Patient's mental status has greatly improved since last week however patient still remains somewhat confused but patient is also extremely hard of hearing and one has to almost yell at her very close to her ear for her to hear. Patient has had SNF bed offers and CSW spoke with patient about this. Patient becomes tearful when discussing rehab but states she will go to rehab if it means she can get better. CSW contacted patient's nephew: Bryn Gulling. [facility] this morning and updated him. Mr. Manson Passey prefers she go to rehab as he states patient's boyfriend had a car accident years ago which left him somewhat cognitively challenged and that he has a difficult time caring for himself let alone patient. He stated that he was coming up with another relative, Clydie Braun, tomorrow to see patient. He understands that she is to discharge today and he stated he will go to [facility] tomorrow when he arrives. CSW informed patient and she is in agreement and is excited to see her nephew and Clydie Braun tomorrow. [facility] is aware of discharge and discharge information has been sent. Patient to transport via her boyfriend: mr. Nelda Severe.  York Spaniel MSW,LCSW [phone]

## 2017-05-21 NOTE — Care Management Important Message (Signed)
Important Message  Patient Details  Name: [place] F Darling MRN: [id number] Date of Birth: [birth year]   Medicare Important Message Given:  Yes(IM reviewed with patient.  she request that her significant other sign it.  copy provided to patient, and copy sent to HIM)    Chapman Fitch, RN 05/21/2017, 1:45 PM

## 2017-05-21 NOTE — Progress Notes (Signed)
Subjective: Patient sitting in recliner.  Awake and alert.    Objective: Current vital signs: BP 127/60   Pulse 70   Temp 98.7 F (37.1 C) (Oral)   Resp 16   Ht 5\' 5"  (1.651 m)   Wt 64 kg (141 lb 1.6 oz)   LMP  (LMP Unknown)   SpO2 99%   BMI 23.48 kg/m  Vital signs in last 24 hours: Temp:  [97.9 F (36.6 C)-98.7 F (37.1 C)] 98.7 F (37.1 C) (03/18 0431) Pulse Rate:  [70-73] 70 (03/18 1012) Resp:  [16] 16 (03/18 0431) BP: (110-127)/(51-88) 127/60 (03/18 1012) SpO2:  [97 %-100 %] 99 % (03/18 0431) Weight:  [64 kg (141 lb 1.6 oz)] 64 kg (141 lb 1.6 oz) (03/18 0431)  Intake/Output from previous day: 03/17 0701 - 03/18 0700 In: 460 [P.O.:460] Out: 100 [Urine:100] Intake/Output this shift: Total I/O In: 360 [P.O.:360] Out: 0  Nutritional status: Seizure precautions Aspiration precautions DIET - DYS 1 Room service appropriate? Yes with Assist; Fluid consistency: Thin  Neurologic Exam: Mental Status: Alert and awake.  Some mild confusion remains.  Follows commands.  Speech fluent.   Cranial Nerves: II: Discs flat bilaterally; Visual fields grossly normal, pupils equal, round, reactive to light and accommodation III,IV, VI: ptosis not present, extra-ocular motions intact bilaterally V,VII: smile symmetric, facial light touch sensation normal bilaterally VIII: hearing normal bilaterally IX,X: gag reflex present XI: bilateral shoulder shrug XII: midline tongue extension Motor: Right : Upper extremity   5/5    Left:     Upper extremity   5/5  Lower extremity   5/5     Lower extremity   5/5 Tone and bulk:normal tone throughout; no atrophy noted   Lab Results: Basic Metabolic Panel: Recent Labs  Lab 05/15/17 0358 05/16/17 1225 05/17/17 0949 05/18/17 0607 05/19/17 0556 05/20/17 0443  NA 135 138  --   --   --  139  K 4.3 3.7 3.5 3.3* 3.5 4.1  CL 107 102  --   --   --  104  CO2 16* 20*  --   --   --  25  GLUCOSE 183* 112*  --   --   --  143*  BUN 10 10  --   --    --  18  CREATININE 0.92 0.79  --   --   --  0.97  CALCIUM 8.3* 8.7*  --   --   --  9.1  MG  --   --  1.5* 1.8 1.8 2.0  PHOS  --   --  2.4* 3.8 4.2  --     Liver Function Tests: No results for input(s): AST, ALT, ALKPHOS, BILITOT, PROT, ALBUMIN in the last 168 hours. No results for input(s): LIPASE, AMYLASE in the last 168 hours. No results for input(s): AMMONIA in the last 168 hours.  CBC: Recent Labs  Lab 05/20/17 0443  WBC 10.3  HGB 11.3*  HCT 34.1*  MCV 88.8  PLT 351    Cardiac Enzymes: No results for input(s): CKTOTAL, CKMB, CKMBINDEX, TROPONINI in the last 168 hours.  Lipid Panel: No results for input(s): CHOL, TRIG, HDL, CHOLHDL, VLDL, LDLCALC in the last 168 hours.  CBG: Recent Labs  Lab 05/20/17 1549 05/20/17 1938 05/21/17 0019 05/21/17 0413 05/21/17 0734  GLUCAP 310* 345* 265* 233* 176*    Microbiology: Results for orders placed or performed during the hospital encounter of 05/10/17  MRSA PCR Screening     Status: None  Collection Time: 05/10/17  6:06 AM  Result Value Ref Range Status   MRSA by PCR NEGATIVE NEGATIVE Final    Comment:        The GeneXpert MRSA Assay (FDA approved for NASAL specimens only), is one component of a comprehensive MRSA colonization surveillance program. It is not intended to diagnose MRSA infection nor to guide or monitor treatment for MRSA infections. Performed at [facility], [address] [place], [place] [address]   Culture, blood (Routine X 2) w Reflex to ID Panel     Status: None   Collection Time: 05/14/17  5:13 AM  Result Value Ref Range Status   Specimen Description BLOOD RIGHT HAND  Final   Special Requests   Final    BOTTLES DRAWN AEROBIC AND ANAEROBIC Blood Culture results may not be optimal due to an excessive volume of blood received in culture bottles   Culture   Final    NO GROWTH 5 DAYS Performed at [facility], [address], [place], [place] [address]    Report Status  05/19/2017 FINAL  Final  Culture, blood (Routine X 2) w Reflex to ID Panel     Status: None   Collection Time: 05/14/17  5:23 AM  Result Value Ref Range Status   Specimen Description BLOOD LEFT HAND  Final   Special Requests   Final    BOTTLES DRAWN AEROBIC AND ANAEROBIC Blood Culture adequate volume   Culture   Final    NO GROWTH 5 DAYS Performed at [facility], [address] [place], [place] [address]    Report Status 05/19/2017 FINAL  Final  Urine Culture     Status: None   Collection Time: 05/14/17  6:14 AM  Result Value Ref Range Status   Specimen Description   Final    URINE, RANDOM Performed at [facility], [address] [place], [place] [address]    Special Requests   Final    NONE Performed at [facility], [address] [place], [place] [address]    Culture   Final    NO GROWTH Performed at [facility], [address], [place], [place] [address]    Report Status 05/15/2017 FINAL  Final    Coagulation Studies: No results for input(s): LABPROT, INR in the last 72 hours.  Imaging: No results found.  Medications:  I have reviewed the patient's current medications. Scheduled: . amLODipine  10 mg Per Tube Daily  . chlorhexidine  15 mL Mouth Rinse BID  . docusate sodium  100 mg Oral BID  . feeding supplement (ENSURE ENLIVE)  237 mL Oral BID BM  . heparin  5,000 Units Subcutaneous Q8H  . insulin aspart  0-9 Units Subcutaneous Q4H  . insulin glargine  14 Units Subcutaneous QHS  . magnesium oxide  400 mg Oral Daily  . mouth rinse  15 mL Mouth Rinse q12n4p  . metoprolol tartrate  25 mg Per Tube BID  . potassium chloride  40 mEq Per Tube Once  . risperiDONE  0.5 mg Oral BID  . traZODone  50 mg Oral QHS    Assessment/Plan: Patient continues to improve.    No further neurologic intervention is recommended at this time.  If further questions arise, please call or page at that time.  Thank you for allowing neurology to  participate in the care of this patient.   LOS: 11 days   Thana Farr, MD Neurology [phone] 05/21/2017  11:12  AM    

## 2017-05-24 DIAGNOSIS — R569 Unspecified convulsions: Secondary | ICD-10-CM | POA: Diagnosis not present

## 2017-05-24 DIAGNOSIS — I10 Essential (primary) hypertension: Secondary | ICD-10-CM | POA: Diagnosis not present

## 2017-05-24 DIAGNOSIS — L89151 Pressure ulcer of sacral region, stage 1: Secondary | ICD-10-CM | POA: Diagnosis not present

## 2017-05-24 DIAGNOSIS — R4182 Altered mental status, unspecified: Secondary | ICD-10-CM | POA: Diagnosis not present

## 2017-05-24 DIAGNOSIS — E119 Type 2 diabetes mellitus without complications: Secondary | ICD-10-CM | POA: Diagnosis not present

## 2017-05-24 DIAGNOSIS — K210 Gastro-esophageal reflux disease with esophagitis: Secondary | ICD-10-CM | POA: Diagnosis not present

## 2017-05-24 DIAGNOSIS — F329 Major depressive disorder, single episode, unspecified: Secondary | ICD-10-CM | POA: Diagnosis not present

## 2017-06-01 DIAGNOSIS — E7800 Pure hypercholesterolemia, unspecified: Secondary | ICD-10-CM | POA: Diagnosis not present

## 2017-06-01 DIAGNOSIS — E118 Type 2 diabetes mellitus with unspecified complications: Secondary | ICD-10-CM | POA: Diagnosis not present

## 2017-06-01 DIAGNOSIS — I10 Essential (primary) hypertension: Secondary | ICD-10-CM | POA: Diagnosis not present

## 2017-06-01 DIAGNOSIS — J449 Chronic obstructive pulmonary disease, unspecified: Secondary | ICD-10-CM | POA: Diagnosis not present

## 2017-06-01 DIAGNOSIS — F319 Bipolar disorder, unspecified: Secondary | ICD-10-CM | POA: Diagnosis not present

## 2017-06-02 DIAGNOSIS — M1990 Unspecified osteoarthritis, unspecified site: Secondary | ICD-10-CM | POA: Diagnosis not present

## 2017-06-02 DIAGNOSIS — E039 Hypothyroidism, unspecified: Secondary | ICD-10-CM | POA: Diagnosis not present

## 2017-06-02 DIAGNOSIS — E559 Vitamin D deficiency, unspecified: Secondary | ICD-10-CM | POA: Diagnosis not present

## 2017-06-02 DIAGNOSIS — Z79891 Long term (current) use of opiate analgesic: Secondary | ICD-10-CM | POA: Diagnosis not present

## 2017-06-02 DIAGNOSIS — B029 Zoster without complications: Secondary | ICD-10-CM | POA: Diagnosis not present

## 2017-06-02 DIAGNOSIS — Z794 Long term (current) use of insulin: Secondary | ICD-10-CM | POA: Diagnosis not present

## 2017-06-02 DIAGNOSIS — F329 Major depressive disorder, single episode, unspecified: Secondary | ICD-10-CM | POA: Diagnosis not present

## 2017-06-02 DIAGNOSIS — Z950 Presence of cardiac pacemaker: Secondary | ICD-10-CM | POA: Diagnosis not present

## 2017-06-02 DIAGNOSIS — E119 Type 2 diabetes mellitus without complications: Secondary | ICD-10-CM | POA: Diagnosis not present

## 2017-06-02 DIAGNOSIS — E785 Hyperlipidemia, unspecified: Secondary | ICD-10-CM | POA: Diagnosis not present

## 2017-06-02 DIAGNOSIS — K219 Gastro-esophageal reflux disease without esophagitis: Secondary | ICD-10-CM | POA: Diagnosis not present

## 2017-06-02 DIAGNOSIS — I509 Heart failure, unspecified: Secondary | ICD-10-CM | POA: Diagnosis not present

## 2017-06-02 DIAGNOSIS — Z9181 History of falling: Secondary | ICD-10-CM | POA: Diagnosis not present

## 2017-06-02 DIAGNOSIS — I110 Hypertensive heart disease with heart failure: Secondary | ICD-10-CM | POA: Diagnosis not present

## 2017-06-02 DIAGNOSIS — J449 Chronic obstructive pulmonary disease, unspecified: Secondary | ICD-10-CM | POA: Diagnosis not present

## 2017-06-06 DIAGNOSIS — I509 Heart failure, unspecified: Secondary | ICD-10-CM | POA: Diagnosis not present

## 2017-06-06 DIAGNOSIS — I110 Hypertensive heart disease with heart failure: Secondary | ICD-10-CM | POA: Diagnosis not present

## 2017-06-06 DIAGNOSIS — E119 Type 2 diabetes mellitus without complications: Secondary | ICD-10-CM | POA: Diagnosis not present

## 2017-06-06 DIAGNOSIS — M1990 Unspecified osteoarthritis, unspecified site: Secondary | ICD-10-CM | POA: Diagnosis not present

## 2017-06-06 DIAGNOSIS — J449 Chronic obstructive pulmonary disease, unspecified: Secondary | ICD-10-CM | POA: Diagnosis not present

## 2017-06-06 DIAGNOSIS — F329 Major depressive disorder, single episode, unspecified: Secondary | ICD-10-CM | POA: Diagnosis not present

## 2017-06-07 ENCOUNTER — Telehealth: Payer: Self-pay

## 2017-06-07 DIAGNOSIS — J449 Chronic obstructive pulmonary disease, unspecified: Secondary | ICD-10-CM | POA: Diagnosis not present

## 2017-06-07 DIAGNOSIS — M1990 Unspecified osteoarthritis, unspecified site: Secondary | ICD-10-CM | POA: Diagnosis not present

## 2017-06-07 DIAGNOSIS — I110 Hypertensive heart disease with heart failure: Secondary | ICD-10-CM | POA: Diagnosis not present

## 2017-06-07 DIAGNOSIS — F329 Major depressive disorder, single episode, unspecified: Secondary | ICD-10-CM | POA: Diagnosis not present

## 2017-06-07 DIAGNOSIS — E119 Type 2 diabetes mellitus without complications: Secondary | ICD-10-CM | POA: Diagnosis not present

## 2017-06-07 DIAGNOSIS — I509 Heart failure, unspecified: Secondary | ICD-10-CM | POA: Diagnosis not present

## 2017-06-07 NOTE — Telephone Encounter (Signed)
Multiple attempts at calling patient for medicare awv. Unable to reach- letter sent.

## 2017-06-12 DIAGNOSIS — F331 Major depressive disorder, recurrent, moderate: Secondary | ICD-10-CM | POA: Diagnosis not present

## 2017-06-14 DIAGNOSIS — E118 Type 2 diabetes mellitus with unspecified complications: Secondary | ICD-10-CM | POA: Diagnosis not present

## 2017-06-14 DIAGNOSIS — E7800 Pure hypercholesterolemia, unspecified: Secondary | ICD-10-CM | POA: Diagnosis not present

## 2017-06-14 DIAGNOSIS — F319 Bipolar disorder, unspecified: Secondary | ICD-10-CM | POA: Diagnosis not present

## 2017-06-14 DIAGNOSIS — I10 Essential (primary) hypertension: Secondary | ICD-10-CM | POA: Diagnosis not present

## 2017-06-18 DIAGNOSIS — I509 Heart failure, unspecified: Secondary | ICD-10-CM | POA: Diagnosis not present

## 2017-06-18 DIAGNOSIS — I110 Hypertensive heart disease with heart failure: Secondary | ICD-10-CM | POA: Diagnosis not present

## 2017-06-18 DIAGNOSIS — M1990 Unspecified osteoarthritis, unspecified site: Secondary | ICD-10-CM | POA: Diagnosis not present

## 2017-06-18 DIAGNOSIS — F329 Major depressive disorder, single episode, unspecified: Secondary | ICD-10-CM | POA: Diagnosis not present

## 2017-06-18 DIAGNOSIS — E119 Type 2 diabetes mellitus without complications: Secondary | ICD-10-CM | POA: Diagnosis not present

## 2017-06-18 DIAGNOSIS — J449 Chronic obstructive pulmonary disease, unspecified: Secondary | ICD-10-CM | POA: Diagnosis not present

## 2017-06-25 DIAGNOSIS — I110 Hypertensive heart disease with heart failure: Secondary | ICD-10-CM | POA: Diagnosis not present

## 2017-06-25 DIAGNOSIS — F329 Major depressive disorder, single episode, unspecified: Secondary | ICD-10-CM | POA: Diagnosis not present

## 2017-06-25 DIAGNOSIS — E119 Type 2 diabetes mellitus without complications: Secondary | ICD-10-CM | POA: Diagnosis not present

## 2017-06-25 DIAGNOSIS — J449 Chronic obstructive pulmonary disease, unspecified: Secondary | ICD-10-CM | POA: Diagnosis not present

## 2017-06-25 DIAGNOSIS — I509 Heart failure, unspecified: Secondary | ICD-10-CM | POA: Diagnosis not present

## 2017-06-25 DIAGNOSIS — M1990 Unspecified osteoarthritis, unspecified site: Secondary | ICD-10-CM | POA: Diagnosis not present

## 2017-06-26 DIAGNOSIS — F331 Major depressive disorder, recurrent, moderate: Secondary | ICD-10-CM | POA: Diagnosis not present

## 2017-06-28 DIAGNOSIS — M1990 Unspecified osteoarthritis, unspecified site: Secondary | ICD-10-CM | POA: Diagnosis not present

## 2017-06-28 DIAGNOSIS — J449 Chronic obstructive pulmonary disease, unspecified: Secondary | ICD-10-CM | POA: Diagnosis not present

## 2017-06-28 DIAGNOSIS — F329 Major depressive disorder, single episode, unspecified: Secondary | ICD-10-CM | POA: Diagnosis not present

## 2017-06-28 DIAGNOSIS — E119 Type 2 diabetes mellitus without complications: Secondary | ICD-10-CM | POA: Diagnosis not present

## 2017-06-28 DIAGNOSIS — I110 Hypertensive heart disease with heart failure: Secondary | ICD-10-CM | POA: Diagnosis not present

## 2017-06-28 DIAGNOSIS — I509 Heart failure, unspecified: Secondary | ICD-10-CM | POA: Diagnosis not present

## 2017-06-29 ENCOUNTER — Other Ambulatory Visit: Payer: Self-pay

## 2017-06-29 ENCOUNTER — Emergency Department
Admission: EM | Admit: 2017-06-29 | Discharge: 2017-06-29 | Disposition: A | Discharge status: Home / Self Care | Payer: Medicare Other | Attending: Emergency Medicine | Admitting: Emergency Medicine

## 2017-06-29 ENCOUNTER — Emergency Department: Payer: Medicare Other

## 2017-06-29 DIAGNOSIS — E119 Type 2 diabetes mellitus without complications: Secondary | ICD-10-CM | POA: Diagnosis not present

## 2017-06-29 DIAGNOSIS — I509 Heart failure, unspecified: Secondary | ICD-10-CM | POA: Diagnosis not present

## 2017-06-29 DIAGNOSIS — M1611 Unilateral primary osteoarthritis, right hip: Secondary | ICD-10-CM | POA: Diagnosis not present

## 2017-06-29 DIAGNOSIS — I110 Hypertensive heart disease with heart failure: Secondary | ICD-10-CM | POA: Diagnosis not present

## 2017-06-29 DIAGNOSIS — Z950 Presence of cardiac pacemaker: Secondary | ICD-10-CM | POA: Diagnosis not present

## 2017-06-29 DIAGNOSIS — J449 Chronic obstructive pulmonary disease, unspecified: Secondary | ICD-10-CM | POA: Insufficient documentation

## 2017-06-29 DIAGNOSIS — G8929 Other chronic pain: Secondary | ICD-10-CM | POA: Insufficient documentation

## 2017-06-29 DIAGNOSIS — I2510 Atherosclerotic heart disease of native coronary artery without angina pectoris: Secondary | ICD-10-CM | POA: Insufficient documentation

## 2017-06-29 DIAGNOSIS — F17210 Nicotine dependence, cigarettes, uncomplicated: Secondary | ICD-10-CM | POA: Diagnosis not present

## 2017-06-29 DIAGNOSIS — Z79899 Other long term (current) drug therapy: Secondary | ICD-10-CM | POA: Insufficient documentation

## 2017-06-29 DIAGNOSIS — M25551 Pain in right hip: Secondary | ICD-10-CM | POA: Insufficient documentation

## 2017-06-29 DIAGNOSIS — Z794 Long term (current) use of insulin: Secondary | ICD-10-CM | POA: Insufficient documentation

## 2017-06-29 MED ORDER — HYDROCODONE-ACETAMINOPHEN 5-325 MG PO TABS: 1.0000 | ORAL_TABLET | Freq: Four times a day (QID) | ORAL | 0 refills | Status: DC | PRN

## 2017-06-29 MED ORDER — KETOROLAC TROMETHAMINE 30 MG/ML IJ SOLN
30.0000 mg | Freq: Once | INTRAMUSCULAR | Status: AC
  Administered 2017-06-29: 30 mg via INTRAMUSCULAR
  Filled 2017-06-29: qty 1

## 2017-06-29 MED ORDER — HYDROCODONE-ACETAMINOPHEN 5-325 MG PO TABS
1.0000 | ORAL_TABLET | Freq: Once | ORAL | Status: AC
Start: 2017-06-29 — End: 2017-06-29
  Administered 2017-06-29: 1 via ORAL
  Filled 2017-06-29: qty 1

## 2017-06-29 NOTE — ED Notes (Signed)
Patient reports right hip pain worsening over the last couple of days. States she has chronic pain from arthritis but lately the pain has impeded her ability to walk. Denies any new injury.

## 2017-06-29 NOTE — ED Triage Notes (Signed)
Patient c/o chronic right hip pain with increasing severity in the last 2 days.

## 2017-06-29 NOTE — ED Notes (Signed)
Patient assisted to bathroom. Patient able to bear weight on right leg.

## 2017-06-29 NOTE — ED Notes (Signed)
ED Provider at bedside. Given coffee.

## 2017-06-29 NOTE — ED Provider Notes (Addendum)
[facility] Emergency Department Provider Note   ____________________________________________   First MD Initiated Contact with Patient 06/29/17 [phone]     (approximate)  I have reviewed the triage vital signs and the nursing notes.   HISTORY  Chief Complaint Hip Pain    HPI Melanie King is a 72 y.o. female who presents to the ED from home with a chief complaint of acute on chronic right hip pain.  Patient has a history of chronic right hip pain for which she has had an initial evaluation for the pain clinic.  Reports increased pain for the past 2 days.  Denies recent fall/injury/trauma but states she fell approximately 1 month ago.  States pain is so bad she is unable to ambulate.  Denies recent fever, chills, chest pain, shortness of breath, abdominal pain, nausea, vomiting.  Denies recent travel.  Pain is worse on ambulation and on movement.   Past Medical History:  Diagnosis Date  . Arthritis   . CHF (congestive heart failure) (HCC)   . Coronary arteriosclerosis   . DDD (degenerative disc disease), cervical   . DDD (degenerative disc disease), lumbar   . Depression   . Diabetes mellitus without complication (HCC)   . GERD (gastroesophageal reflux disease)   . Hyperlipidemia   . Hypertension   . Osteoarthritis   . Pacemaker   . Tremor     Patient Active Problem List   Diagnosis Date Noted  . Acute delirium 05/12/2017  . Seizure (HCC) 05/10/2017  . Elevated sedimentation rate 04/04/2017  . Elevated C-reactive protein (CRP) 04/04/2017  . Chest pain, unspecified 04/02/2017  . Bilateral lower extremity pain (Secondary Area of Pain) (R>L) 04/02/2017  . Chronic hip pain, bilateral  (Secondary Area of Pain) (R>L) 04/02/2017  . Chronic bilateral low back pain with bilateral sciatica [facility] Area of Pain) (R>L) 04/02/2017  . Chronic pain syndrome 04/02/2017  . Disorder of bone, unspecified 04/02/2017  . Other long term (current) drug therapy  04/02/2017  . Other specified health status 04/02/2017  . Long term current use of opiate analgesic 04/02/2017  . Hip pain, bilateral 12/06/2016  . Tremor 05/27/2016  . Trigger finger of left hand 02/16/2016  . Osteoarthritis of both hands 02/16/2016  . Angina pectoris (HCC) 01/17/2016  . COPD (chronic obstructive pulmonary disease) (HCC) 01/17/2016  . CAD (coronary artery disease) 01/17/2016  . HLD (hyperlipidemia) 01/17/2016  . Hypertension 01/17/2016  . Sick sinus syndrome (HCC) 01/17/2016  . Chronic pain 01/17/2016  . Personal history of tobacco use, presenting hazards to health 11/16/2015  . Diabetes (HCC) 05/12/2013    Past Surgical History:  Procedure Laterality Date  . ABDOMINAL HYSTERECTOMY    . BREAST BIOPSY Right 1994   neg cyst removed  . CHOLECYSTECTOMY    . EYE SURGERY  1964, 1966, and 1967   bilateral  . FOOT SURGERY Right    cellulitis  . PACEMAKER INSERTION    . SPINE SURGERY     cyst removed; [facility]    Prior to Admission medications   Medication Sig Start Date End Date Taking? Authorizing Provider  acetaminophen (TYLENOL) 325 MG tablet Take 2 tablets (650 mg total) by mouth every 6 (six) hours as needed for mild pain (or Fever >/= 101). 05/21/17   Alford Highland, MD  albuterol (PROVENTIL HFA;VENTOLIN HFA) 108 (90 Base) MCG/ACT inhaler Inhale 2 puffs into the lungs every 6 (six) hours as needed for wheezing or shortness of breath.    [provider]  amLODipine (NORVASC) 10 MG tablet TAKE 1 TABLET BY MOUTH EVERY DAY 08/28/16   Gabriel Cirri, NP  docusate sodium (COLACE) 100 MG capsule TAKE 1 CAPSULE BY MOUTH EVERY DAY 01/02/17   Gabriel Cirri, NP  feeding supplement, ENSURE ENLIVE, (ENSURE ENLIVE) LIQD Take 237 mLs by mouth 2 (two) times daily between meals. 05/21/17   Alford Highland, MD  fluticasone (FLONASE) 50 MCG/ACT nasal spray Place 1 spray into both nostrils daily. 04/14/16   Gabriel Cirri, NP  insulin aspart (NOVOLOG) 100 UNIT/ML  injection Inject 4 Units into the skin 3 (three) times daily with meals. 05/21/17   Alford Highland, MD  insulin glargine (LANTUS) 100 UNIT/ML injection Inject 0.14 mLs (14 Units total) into the skin at bedtime. 05/21/17   Alford Highland, MD  levothyroxine (SYNTHROID) 50 MCG tablet Take 1 tablet (50 mcg total) by mouth daily. 05/21/17 05/21/18  Alford Highland, MD  magnesium oxide (MAG-OX) 400 (241.3 Mg) MG tablet Take 1 tablet (400 mg total) by mouth daily. 05/22/17   Alford Highland, MD  metoprolol tartrate (LOPRESSOR) 25 MG tablet Take 25 mg by mouth 2 (two) times daily.    [provider]  risperiDONE (RISPERDAL M-TABS) 0.5 MG disintegrating tablet Take 1 tablet (0.5 mg total) by mouth 2 (two) times daily. 05/21/17   Alford Highland, MD  traZODone (DESYREL) 50 MG tablet Take 1 tablet (50 mg total) by mouth at bedtime. 05/21/17   Alford Highland, MD  TRUETRACK TEST test strip USE TO CHECK BLOOD SUGAR LEVEL ONCE DAILY. DX CODE E11.65 05/02/17   Gabriel Cirri, NP  Stann Ore SHORT PEN NEEDLES 31G X 8 MM MISC  01/31/16   [provider]  Vitamin D, Ergocalciferol, (DRISDOL) 50000 units CAPS capsule TAKE ONE CAPSULE BY MOUTH MONTHLY 01/29/17   Olevia Perches P, DO    Allergies Alprazolam; Demerol [meperidine]; and Fentanyl  Family History  Problem Relation Age of Onset  . Breast cancer Maternal Aunt 40  . Cancer Mother        colon  . Aneurysm Father     Social History Social History   Tobacco Use  . Smoking status: Current Every Day Smoker    Packs/day: 0.75    Years: 50.00    Pack years: 37.50    Types: Cigarettes  . Smokeless tobacco: Never Used  Substance Use Topics  . Alcohol use: No  . Drug use: No    Review of Systems  Constitutional: No fever/chills. Eyes: No visual changes. ENT: No sore throat. Cardiovascular: Denies chest pain. Respiratory: Denies shortness of breath. Gastrointestinal: No abdominal pain.  No nausea, no vomiting.  No diarrhea.  No  constipation. Genitourinary: Negative for dysuria. Musculoskeletal: Positive for acute on chronic right hip pain.  Negative for back pain. Skin: Negative for rash. Neurological: Negative for headaches, focal weakness or numbness.   ____________________________________________   PHYSICAL EXAM:  VITAL SIGNS: ED Triage Vitals  Enc Vitals Group     BP 06/29/17 0241 (!) 161/67     Pulse Rate 06/29/17 0241 70     Resp 06/29/17 0241 18     Temp 06/29/17 0241 98.3 F (36.8 C)     Temp Source 06/29/17 0241 Oral     SpO2 06/29/17 0241 98 %     Weight 06/29/17 0242 140 lb (63.5 kg)     Height --      Head Circumference --      Peak Flow --      Pain Score 06/29/17 0242  8     Pain Loc --      Pain Edu? --      Excl. in GC? --     Constitutional: Alert and oriented. Well appearing and in no acute distress. Eyes: Conjunctivae are normal. PERRL. EOMI. Head: Atraumatic. Nose: No congestion/rhinnorhea. Mouth/Throat: Mucous membranes are moist.  Oropharynx non-erythematous. Neck: No stridor.  No cervical spine tenderness to palpation. Cardiovascular: Normal rate, regular rhythm. Grossly normal heart sounds.  Good peripheral circulation. Respiratory: Normal respiratory effort.  No retractions. Lungs CTAB. Gastrointestinal: Soft and nontender. No distention. No abdominal bruits. No CVA tenderness. Musculoskeletal: Right lateral hip tender to palpation.  Full range of motion with some pain.  There is no shortening or external rotation.  2+ femoral and distal pulses.  Brisk, less than 5-second capillary refill.  No joint effusions. Neurologic:  Normal speech and language. No gross focal neurologic deficits are appreciated.  Skin:  Skin is warm, dry and intact. No rash noted. Psychiatric: Mood and affect are normal. Speech and behavior are normal.  ____________________________________________   LABS (all labs ordered are listed, but only abnormal results are displayed)  Labs Reviewed - No  data to display ____________________________________________  EKG  None ____________________________________________  RADIOLOGY  ED MD interpretation: Right hip x-ray reveals degenerative changes.  Official radiology report(s): Ct Hip Right Wo Contrast  Result Date: 06/29/2017 CLINICAL DATA:  Chronic right hip pain has worsened over the past 2-3 days. Difficulty walking. No known injury. EXAM: CT OF THE RIGHT HIP WITHOUT CONTRAST TECHNIQUE: Multidetector CT imaging of the right hip was performed according to the standard protocol. Multiplanar CT image reconstructions were also generated. COMPARISON:  Plain films of the right hip 06/29/2017 and 02/08/2014. FINDINGS: Bones/Joint/Cartilage The hip is located. There is no fracture or focal bone lesion. No avascular necrosis of the femoral head. Joint space is preserved. No subchondral sclerosis, subchondral cyst formation or osteophytosis about the right hip is identified. There is no erosion. Mild to moderate degenerative change is noted about the symphysis pubis. Ligaments Suboptimally assessed by CT. Muscles and Tendons Intact. Soft tissues Small calcifications subcutaneous fat adjacent to the greater trochanter may be due to old injury. Mild subcutaneous stranding is noted. Imaged intrapelvic contents demonstrate atherosclerotic vascular disease. IMPRESSION: Normal-appearing right hip. Mild to moderate degenerative disease about the symphysis pubis. Atherosclerosis. Mild subcutaneous stranding is likely due to dependent change Electronically Signed   By: Drusilla Kanner M.D.   On: 06/29/2017 07:22   Dg Hip Unilat  With Pelvis 2-3 Views Right  Result Date: 06/29/2017 CLINICAL DATA:  Chronic right hip pain increasing over the last 2 days. No reported injury. EXAM: DG HIP (WITH OR WITHOUT PELVIS) 2-3V RIGHT COMPARISON:  10/13/2014 FINDINGS: Degenerative changes in both hips. No significant progression since previous study. No evidence of acute  fracture or dislocation of the pelvis or right hip. No focal bone lesion or bone destruction. Visualized sacrum appears intact. SI joints and symphysis pubis are not displaced. Vascular calcifications. IMPRESSION: Degenerative changes in the right hip. No acute displaced fractures identified. Electronically Signed   By: Burman Nieves M.D.   On: 06/29/2017 03:23    ____________________________________________   PROCEDURES  Procedure(s) performed: None  Procedures  Critical Care performed: No  ____________________________________________   INITIAL IMPRESSION / ASSESSMENT AND PLAN / ED COURSE  As part of my medical decision making, I reviewed the following data within the electronic MEDICAL RECORD NUMBER Nursing notes reviewed and incorporated, Old chart reviewed, Radiograph reviewed  and Notes from prior ED visits   72 year old female who presents with acute on chronic nontraumatic right hip pain.  Differential diagnosis includes but is not limited to degenerative changes, occult fractures, septic joint, bursitis, etc.  Given patient's fall 1 month ago, will proceed with CT hip to evaluate for occult fractures.  IM Toradol and Norco given for pain control.   Clinical Course as of Jun 29 737  Fri Jun 29, 2017  0712 CT scan of the right hip pending results.  If negative, would attempt a trial of ambulation. Care transferred to Dr. Darnelle Catalan pending CT results and disposition.   [JS]  [id number] Updated patient of CT imaging results.  Patient is sitting up, resting comfortably and drinking coffee.  Will attempt ambulation with walker after she is finished.  If patient is able to ambulate, will refer her to orthopedics for follow-up and limited prescription of Norco for pain.  Dr. Darnelle Catalan to reassess after ambulation trial.   [JS]    Clinical Course User Index [JS] Irean Hong, MD     ____________________________________________   FINAL CLINICAL IMPRESSION(S) / ED DIAGNOSES  Final  diagnoses:  Right hip pain  Chronic pain of right hip     ED Discharge Orders    None       Note:  This document was prepared using Dragon voice recognition software and may include unintentional dictation errors.    Irean Hong, MD 06/29/17 9147    Irean Hong, MD 06/29/17 [phone]

## 2017-06-29 NOTE — Discharge Instructions (Signed)
1.  Usual walker for balance as you walk. 2.  You may take pain medicine as needed (Norco #15). 3.  Return to the ER for worsening symptoms, persistent vomiting, difficulty breathing or other concerns.

## 2017-06-29 NOTE — ED Notes (Signed)
Pt ambulates with walker up from bed and to bathroom with ease. C/o hip pain but ambulates safely. Pt alert and oriented X4, active, cooperative, pt in NAD. RR even and unlabored, color WNL.

## 2017-06-29 NOTE — ED Notes (Signed)
Pt requesting cup of coffee. Will await CT results before giving patient food/drink.

## 2017-06-29 NOTE — ED Notes (Signed)
Pt ride coming for her. Pt states she does not like our walkers and has a cane at home that she will use until she gets a rolling walker today. EDP informed.

## 2017-06-29 NOTE — ED Notes (Signed)
Patient ambulatory to restroom with standby assistance.

## 2017-06-29 NOTE — ED Provider Notes (Signed)
patient walks with walker very well in the ER. She does not want one of our walkers she wants to continue using the cane she has. It is okay.   Arnaldo Natal, MD 06/29/17 [phone]

## 2017-06-30 DIAGNOSIS — E119 Type 2 diabetes mellitus without complications: Secondary | ICD-10-CM | POA: Diagnosis not present

## 2017-06-30 DIAGNOSIS — I110 Hypertensive heart disease with heart failure: Secondary | ICD-10-CM | POA: Diagnosis not present

## 2017-06-30 DIAGNOSIS — M1990 Unspecified osteoarthritis, unspecified site: Secondary | ICD-10-CM | POA: Diagnosis not present

## 2017-06-30 DIAGNOSIS — I509 Heart failure, unspecified: Secondary | ICD-10-CM | POA: Diagnosis not present

## 2017-06-30 DIAGNOSIS — J449 Chronic obstructive pulmonary disease, unspecified: Secondary | ICD-10-CM | POA: Diagnosis not present

## 2017-06-30 DIAGNOSIS — F329 Major depressive disorder, single episode, unspecified: Secondary | ICD-10-CM | POA: Diagnosis not present

## 2017-07-02 DIAGNOSIS — M5441 Lumbago with sciatica, right side: Secondary | ICD-10-CM | POA: Diagnosis not present

## 2017-07-02 DIAGNOSIS — M545 Low back pain: Secondary | ICD-10-CM | POA: Diagnosis not present

## 2017-07-03 DIAGNOSIS — K219 Gastro-esophageal reflux disease without esophagitis: Secondary | ICD-10-CM | POA: Diagnosis not present

## 2017-07-03 DIAGNOSIS — R0602 Shortness of breath: Secondary | ICD-10-CM | POA: Diagnosis not present

## 2017-07-03 DIAGNOSIS — F411 Generalized anxiety disorder: Secondary | ICD-10-CM | POA: Diagnosis not present

## 2017-07-03 DIAGNOSIS — I209 Angina pectoris, unspecified: Secondary | ICD-10-CM | POA: Diagnosis not present

## 2017-07-03 DIAGNOSIS — E782 Mixed hyperlipidemia: Secondary | ICD-10-CM | POA: Diagnosis not present

## 2017-07-03 DIAGNOSIS — Z950 Presence of cardiac pacemaker: Secondary | ICD-10-CM | POA: Diagnosis not present

## 2017-07-03 DIAGNOSIS — R296 Repeated falls: Secondary | ICD-10-CM | POA: Diagnosis not present

## 2017-07-03 DIAGNOSIS — I10 Essential (primary) hypertension: Secondary | ICD-10-CM | POA: Diagnosis not present

## 2017-07-03 DIAGNOSIS — I2510 Atherosclerotic heart disease of native coronary artery without angina pectoris: Secondary | ICD-10-CM | POA: Diagnosis not present

## 2017-07-03 DIAGNOSIS — I495 Sick sinus syndrome: Secondary | ICD-10-CM | POA: Diagnosis not present

## 2017-07-03 DIAGNOSIS — J449 Chronic obstructive pulmonary disease, unspecified: Secondary | ICD-10-CM | POA: Diagnosis not present

## 2017-07-03 DIAGNOSIS — F17200 Nicotine dependence, unspecified, uncomplicated: Secondary | ICD-10-CM | POA: Diagnosis not present

## 2017-07-04 DIAGNOSIS — J449 Chronic obstructive pulmonary disease, unspecified: Secondary | ICD-10-CM | POA: Diagnosis not present

## 2017-07-04 DIAGNOSIS — I110 Hypertensive heart disease with heart failure: Secondary | ICD-10-CM | POA: Diagnosis not present

## 2017-07-04 DIAGNOSIS — E119 Type 2 diabetes mellitus without complications: Secondary | ICD-10-CM | POA: Diagnosis not present

## 2017-07-04 DIAGNOSIS — F329 Major depressive disorder, single episode, unspecified: Secondary | ICD-10-CM | POA: Diagnosis not present

## 2017-07-04 DIAGNOSIS — I509 Heart failure, unspecified: Secondary | ICD-10-CM | POA: Diagnosis not present

## 2017-07-04 DIAGNOSIS — M1990 Unspecified osteoarthritis, unspecified site: Secondary | ICD-10-CM | POA: Diagnosis not present

## 2017-07-09 DIAGNOSIS — F329 Major depressive disorder, single episode, unspecified: Secondary | ICD-10-CM | POA: Diagnosis not present

## 2017-07-09 DIAGNOSIS — M1990 Unspecified osteoarthritis, unspecified site: Secondary | ICD-10-CM | POA: Diagnosis not present

## 2017-07-09 DIAGNOSIS — J449 Chronic obstructive pulmonary disease, unspecified: Secondary | ICD-10-CM | POA: Diagnosis not present

## 2017-07-09 DIAGNOSIS — I110 Hypertensive heart disease with heart failure: Secondary | ICD-10-CM | POA: Diagnosis not present

## 2017-07-09 DIAGNOSIS — E119 Type 2 diabetes mellitus without complications: Secondary | ICD-10-CM | POA: Diagnosis not present

## 2017-07-09 DIAGNOSIS — I509 Heart failure, unspecified: Secondary | ICD-10-CM | POA: Diagnosis not present

## 2017-07-11 DIAGNOSIS — M25552 Pain in left hip: Secondary | ICD-10-CM | POA: Diagnosis not present

## 2017-07-11 DIAGNOSIS — R251 Tremor, unspecified: Secondary | ICD-10-CM | POA: Diagnosis not present

## 2017-07-11 DIAGNOSIS — M25551 Pain in right hip: Secondary | ICD-10-CM | POA: Diagnosis not present

## 2017-07-13 DIAGNOSIS — J449 Chronic obstructive pulmonary disease, unspecified: Secondary | ICD-10-CM | POA: Diagnosis not present

## 2017-07-13 DIAGNOSIS — F329 Major depressive disorder, single episode, unspecified: Secondary | ICD-10-CM | POA: Diagnosis not present

## 2017-07-13 DIAGNOSIS — I509 Heart failure, unspecified: Secondary | ICD-10-CM | POA: Diagnosis not present

## 2017-07-13 DIAGNOSIS — I110 Hypertensive heart disease with heart failure: Secondary | ICD-10-CM | POA: Diagnosis not present

## 2017-07-13 DIAGNOSIS — E119 Type 2 diabetes mellitus without complications: Secondary | ICD-10-CM | POA: Diagnosis not present

## 2017-07-13 DIAGNOSIS — M1990 Unspecified osteoarthritis, unspecified site: Secondary | ICD-10-CM | POA: Diagnosis not present

## 2017-07-17 DIAGNOSIS — F331 Major depressive disorder, recurrent, moderate: Secondary | ICD-10-CM | POA: Diagnosis not present

## 2017-07-20 DIAGNOSIS — F329 Major depressive disorder, single episode, unspecified: Secondary | ICD-10-CM | POA: Diagnosis not present

## 2017-07-20 DIAGNOSIS — J449 Chronic obstructive pulmonary disease, unspecified: Secondary | ICD-10-CM | POA: Diagnosis not present

## 2017-07-20 DIAGNOSIS — E119 Type 2 diabetes mellitus without complications: Secondary | ICD-10-CM | POA: Diagnosis not present

## 2017-07-20 DIAGNOSIS — M1990 Unspecified osteoarthritis, unspecified site: Secondary | ICD-10-CM | POA: Diagnosis not present

## 2017-07-20 DIAGNOSIS — I509 Heart failure, unspecified: Secondary | ICD-10-CM | POA: Diagnosis not present

## 2017-07-20 DIAGNOSIS — I110 Hypertensive heart disease with heart failure: Secondary | ICD-10-CM | POA: Diagnosis not present

## 2017-07-24 ENCOUNTER — Other Ambulatory Visit: Payer: Self-pay | Admitting: Adult Health

## 2017-07-24 DIAGNOSIS — Z1231 Encounter for screening mammogram for malignant neoplasm of breast: Secondary | ICD-10-CM

## 2017-07-27 DIAGNOSIS — E119 Type 2 diabetes mellitus without complications: Secondary | ICD-10-CM | POA: Diagnosis not present

## 2017-07-27 DIAGNOSIS — F329 Major depressive disorder, single episode, unspecified: Secondary | ICD-10-CM | POA: Diagnosis not present

## 2017-07-27 DIAGNOSIS — I110 Hypertensive heart disease with heart failure: Secondary | ICD-10-CM | POA: Diagnosis not present

## 2017-07-27 DIAGNOSIS — J449 Chronic obstructive pulmonary disease, unspecified: Secondary | ICD-10-CM | POA: Diagnosis not present

## 2017-07-27 DIAGNOSIS — M1990 Unspecified osteoarthritis, unspecified site: Secondary | ICD-10-CM | POA: Diagnosis not present

## 2017-07-27 DIAGNOSIS — I509 Heart failure, unspecified: Secondary | ICD-10-CM | POA: Diagnosis not present

## 2017-08-01 DIAGNOSIS — Z9181 History of falling: Secondary | ICD-10-CM | POA: Diagnosis not present

## 2017-08-01 DIAGNOSIS — J449 Chronic obstructive pulmonary disease, unspecified: Secondary | ICD-10-CM | POA: Diagnosis not present

## 2017-08-01 DIAGNOSIS — F329 Major depressive disorder, single episode, unspecified: Secondary | ICD-10-CM | POA: Diagnosis not present

## 2017-08-01 DIAGNOSIS — K219 Gastro-esophageal reflux disease without esophagitis: Secondary | ICD-10-CM | POA: Diagnosis not present

## 2017-08-01 DIAGNOSIS — E039 Hypothyroidism, unspecified: Secondary | ICD-10-CM | POA: Diagnosis not present

## 2017-08-01 DIAGNOSIS — E119 Type 2 diabetes mellitus without complications: Secondary | ICD-10-CM | POA: Diagnosis not present

## 2017-08-01 DIAGNOSIS — E559 Vitamin D deficiency, unspecified: Secondary | ICD-10-CM | POA: Diagnosis not present

## 2017-08-01 DIAGNOSIS — M1990 Unspecified osteoarthritis, unspecified site: Secondary | ICD-10-CM | POA: Diagnosis not present

## 2017-08-01 DIAGNOSIS — E785 Hyperlipidemia, unspecified: Secondary | ICD-10-CM | POA: Diagnosis not present

## 2017-08-01 DIAGNOSIS — Z794 Long term (current) use of insulin: Secondary | ICD-10-CM | POA: Diagnosis not present

## 2017-08-01 DIAGNOSIS — I509 Heart failure, unspecified: Secondary | ICD-10-CM | POA: Diagnosis not present

## 2017-08-01 DIAGNOSIS — I110 Hypertensive heart disease with heart failure: Secondary | ICD-10-CM | POA: Diagnosis not present

## 2017-08-04 DIAGNOSIS — E119 Type 2 diabetes mellitus without complications: Secondary | ICD-10-CM | POA: Diagnosis not present

## 2017-08-04 DIAGNOSIS — I110 Hypertensive heart disease with heart failure: Secondary | ICD-10-CM | POA: Diagnosis not present

## 2017-08-04 DIAGNOSIS — J449 Chronic obstructive pulmonary disease, unspecified: Secondary | ICD-10-CM | POA: Diagnosis not present

## 2017-08-04 DIAGNOSIS — F329 Major depressive disorder, single episode, unspecified: Secondary | ICD-10-CM | POA: Diagnosis not present

## 2017-08-04 DIAGNOSIS — I509 Heart failure, unspecified: Secondary | ICD-10-CM | POA: Diagnosis not present

## 2017-08-04 DIAGNOSIS — M1990 Unspecified osteoarthritis, unspecified site: Secondary | ICD-10-CM | POA: Diagnosis not present

## 2017-08-08 ENCOUNTER — Ambulatory Visit
Admission: RE | Admit: 2017-08-08 | Discharge: 2017-08-08 | Disposition: A | Discharge status: Home / Self Care | Payer: Medicare Other | Source: Ambulatory Visit | Attending: Adult Health | Admitting: Adult Health

## 2017-08-08 DIAGNOSIS — Z1231 Encounter for screening mammogram for malignant neoplasm of breast: Secondary | ICD-10-CM | POA: Diagnosis not present

## 2017-08-10 DIAGNOSIS — I110 Hypertensive heart disease with heart failure: Secondary | ICD-10-CM | POA: Diagnosis not present

## 2017-08-10 DIAGNOSIS — M1990 Unspecified osteoarthritis, unspecified site: Secondary | ICD-10-CM | POA: Diagnosis not present

## 2017-08-10 DIAGNOSIS — J449 Chronic obstructive pulmonary disease, unspecified: Secondary | ICD-10-CM | POA: Diagnosis not present

## 2017-08-10 DIAGNOSIS — F329 Major depressive disorder, single episode, unspecified: Secondary | ICD-10-CM | POA: Diagnosis not present

## 2017-08-10 DIAGNOSIS — I509 Heart failure, unspecified: Secondary | ICD-10-CM | POA: Diagnosis not present

## 2017-08-10 DIAGNOSIS — E119 Type 2 diabetes mellitus without complications: Secondary | ICD-10-CM | POA: Diagnosis not present

## 2017-08-14 DIAGNOSIS — E119 Type 2 diabetes mellitus without complications: Secondary | ICD-10-CM | POA: Diagnosis not present

## 2017-08-14 DIAGNOSIS — J449 Chronic obstructive pulmonary disease, unspecified: Secondary | ICD-10-CM | POA: Diagnosis not present

## 2017-08-14 DIAGNOSIS — F329 Major depressive disorder, single episode, unspecified: Secondary | ICD-10-CM | POA: Diagnosis not present

## 2017-08-14 DIAGNOSIS — I509 Heart failure, unspecified: Secondary | ICD-10-CM | POA: Diagnosis not present

## 2017-08-14 DIAGNOSIS — I110 Hypertensive heart disease with heart failure: Secondary | ICD-10-CM | POA: Diagnosis not present

## 2017-08-14 DIAGNOSIS — M1990 Unspecified osteoarthritis, unspecified site: Secondary | ICD-10-CM | POA: Diagnosis not present

## 2017-08-15 DIAGNOSIS — F331 Major depressive disorder, recurrent, moderate: Secondary | ICD-10-CM | POA: Diagnosis not present

## 2017-08-20 DIAGNOSIS — L851 Acquired keratosis [keratoderma] palmaris et plantaris: Secondary | ICD-10-CM | POA: Diagnosis not present

## 2017-08-20 DIAGNOSIS — E1142 Type 2 diabetes mellitus with diabetic polyneuropathy: Secondary | ICD-10-CM | POA: Diagnosis not present

## 2017-08-20 DIAGNOSIS — B351 Tinea unguium: Secondary | ICD-10-CM | POA: Diagnosis not present

## 2017-08-20 DIAGNOSIS — E1159 Type 2 diabetes mellitus with other circulatory complications: Secondary | ICD-10-CM | POA: Diagnosis not present

## 2017-08-21 DIAGNOSIS — M1990 Unspecified osteoarthritis, unspecified site: Secondary | ICD-10-CM | POA: Diagnosis not present

## 2017-08-21 DIAGNOSIS — E119 Type 2 diabetes mellitus without complications: Secondary | ICD-10-CM | POA: Diagnosis not present

## 2017-08-21 DIAGNOSIS — F329 Major depressive disorder, single episode, unspecified: Secondary | ICD-10-CM | POA: Diagnosis not present

## 2017-08-21 DIAGNOSIS — I110 Hypertensive heart disease with heart failure: Secondary | ICD-10-CM | POA: Diagnosis not present

## 2017-08-21 DIAGNOSIS — I509 Heart failure, unspecified: Secondary | ICD-10-CM | POA: Diagnosis not present

## 2017-08-21 DIAGNOSIS — J449 Chronic obstructive pulmonary disease, unspecified: Secondary | ICD-10-CM | POA: Diagnosis not present

## 2017-08-29 DIAGNOSIS — R0609 Other forms of dyspnea: Secondary | ICD-10-CM | POA: Diagnosis not present

## 2017-08-29 DIAGNOSIS — J42 Unspecified chronic bronchitis: Secondary | ICD-10-CM | POA: Diagnosis not present

## 2017-08-30 DIAGNOSIS — F329 Major depressive disorder, single episode, unspecified: Secondary | ICD-10-CM | POA: Diagnosis not present

## 2017-08-30 DIAGNOSIS — J449 Chronic obstructive pulmonary disease, unspecified: Secondary | ICD-10-CM | POA: Diagnosis not present

## 2017-08-30 DIAGNOSIS — I110 Hypertensive heart disease with heart failure: Secondary | ICD-10-CM | POA: Diagnosis not present

## 2017-08-30 DIAGNOSIS — I509 Heart failure, unspecified: Secondary | ICD-10-CM | POA: Diagnosis not present

## 2017-08-30 DIAGNOSIS — E119 Type 2 diabetes mellitus without complications: Secondary | ICD-10-CM | POA: Diagnosis not present

## 2017-08-30 DIAGNOSIS — M1990 Unspecified osteoarthritis, unspecified site: Secondary | ICD-10-CM | POA: Diagnosis not present

## 2017-09-05 DIAGNOSIS — J449 Chronic obstructive pulmonary disease, unspecified: Secondary | ICD-10-CM | POA: Diagnosis not present

## 2017-09-05 DIAGNOSIS — F329 Major depressive disorder, single episode, unspecified: Secondary | ICD-10-CM | POA: Diagnosis not present

## 2017-09-05 DIAGNOSIS — M1990 Unspecified osteoarthritis, unspecified site: Secondary | ICD-10-CM | POA: Diagnosis not present

## 2017-09-05 DIAGNOSIS — E119 Type 2 diabetes mellitus without complications: Secondary | ICD-10-CM | POA: Diagnosis not present

## 2017-09-05 DIAGNOSIS — I509 Heart failure, unspecified: Secondary | ICD-10-CM | POA: Diagnosis not present

## 2017-09-05 DIAGNOSIS — I110 Hypertensive heart disease with heart failure: Secondary | ICD-10-CM | POA: Diagnosis not present

## 2017-09-12 DIAGNOSIS — F331 Major depressive disorder, recurrent, moderate: Secondary | ICD-10-CM | POA: Diagnosis not present

## 2017-09-13 DIAGNOSIS — I110 Hypertensive heart disease with heart failure: Secondary | ICD-10-CM | POA: Diagnosis not present

## 2017-09-13 DIAGNOSIS — I509 Heart failure, unspecified: Secondary | ICD-10-CM | POA: Diagnosis not present

## 2017-09-13 DIAGNOSIS — J449 Chronic obstructive pulmonary disease, unspecified: Secondary | ICD-10-CM | POA: Diagnosis not present

## 2017-09-13 DIAGNOSIS — F329 Major depressive disorder, single episode, unspecified: Secondary | ICD-10-CM | POA: Diagnosis not present

## 2017-09-13 DIAGNOSIS — E119 Type 2 diabetes mellitus without complications: Secondary | ICD-10-CM | POA: Diagnosis not present

## 2017-09-13 DIAGNOSIS — M1990 Unspecified osteoarthritis, unspecified site: Secondary | ICD-10-CM | POA: Diagnosis not present
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
## 2018-03-05 DIAGNOSIS — R29898 Other symptoms and signs involving the musculoskeletal system: Secondary | ICD-10-CM | POA: Insufficient documentation

## 2018-03-06 IMAGING — CT CT ANGIO CHEST
4 of 7 series · 18 of 36 positions shown · IV contrast (APPLIED)
Comparison: Chest radiograph 03/28/2017

Chest CT 11/16/2015

CLINICAL DATA: Chest pain and shortness of breath

EXAM:
CT ANGIOGRAPHY CHEST WITH CONTRAST
TECHNIQUE: Multidetector CT imaging of the chest was performed using the
standard protocol during bolus administration of intravenous
contrast. Multiplanar CT image reconstructions and MIPs were
obtained to evaluate the vascular anatomy.
CONTRAST:  75mL [id number] IOPAMIDOL ([id number]) INJECTION 76%

[Series 3: axial pre · axial · non-contrast · 0.69mm/px · z∈[-540,-414]mm · 3 of 51 slices shown]
[im 13/51  lung]
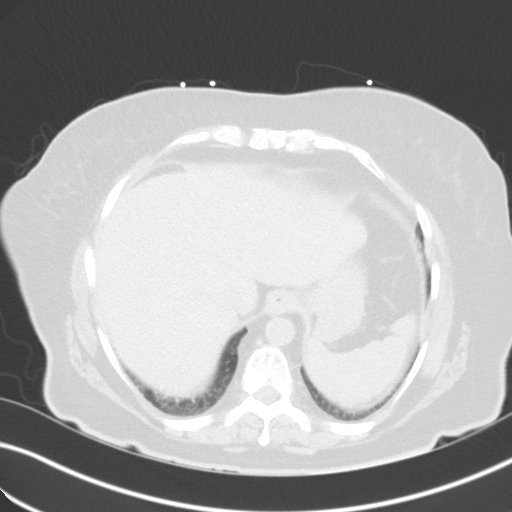
[im 26/51  lung]
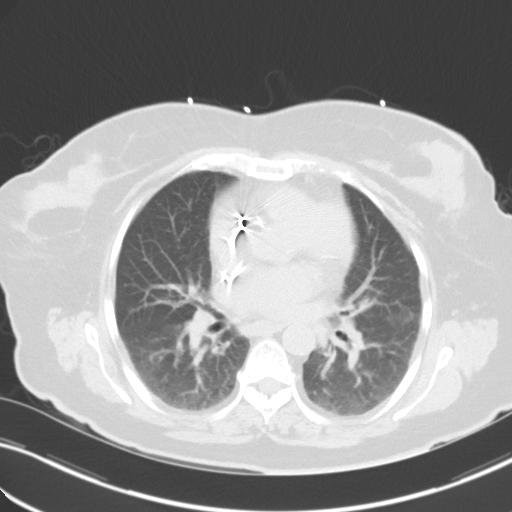
[im 38/51  lung]
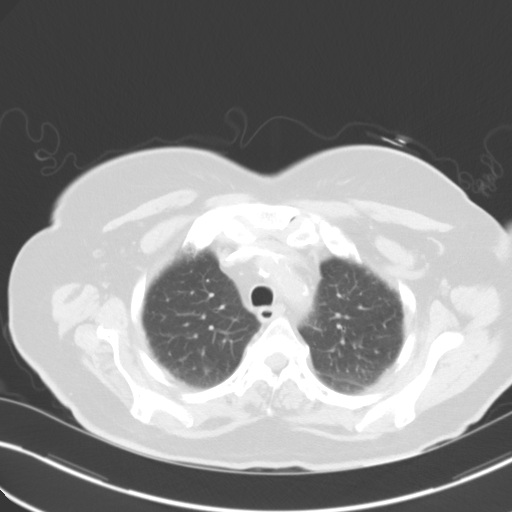

[Series 6: axial arterial · axial · arterial · 0.68mm/px · z∈[-580,-388]mm · 7 of 86 slices shown]
[im 11/86  lung]
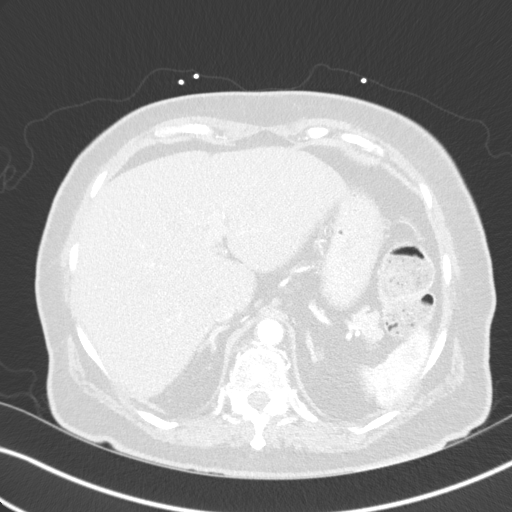
[im 22/86  mediastinal]
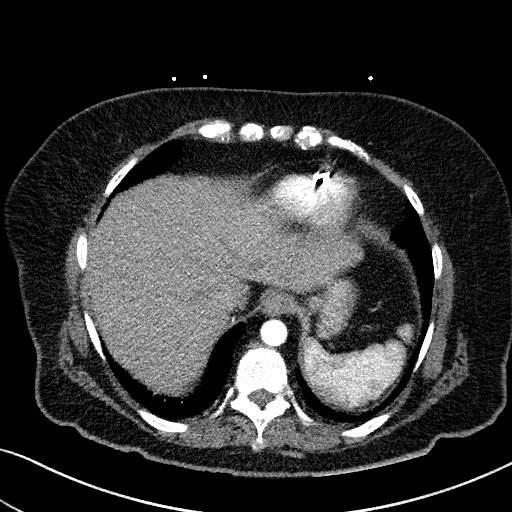
[im 32/86  lung]
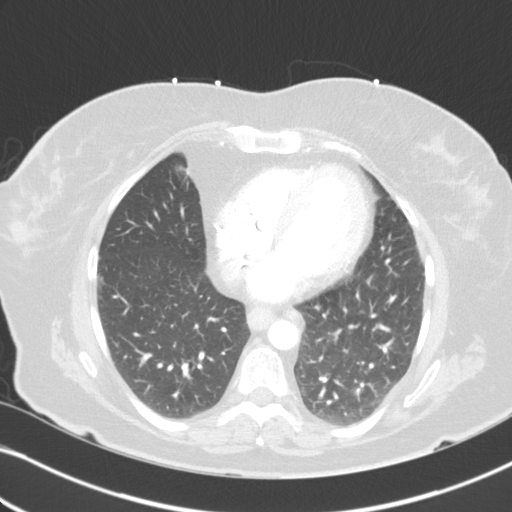
[im 43/86  mediastinal]
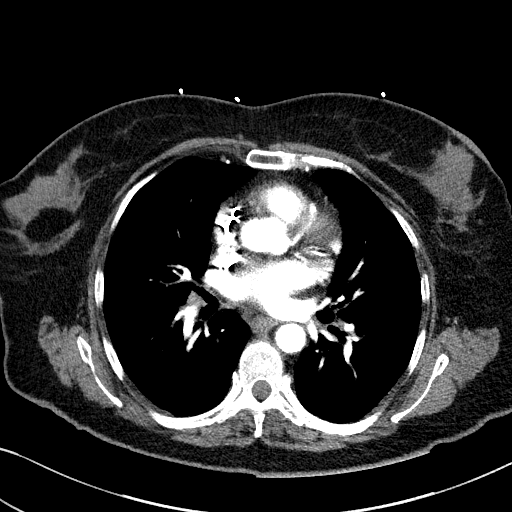
[im 54/86  lung]
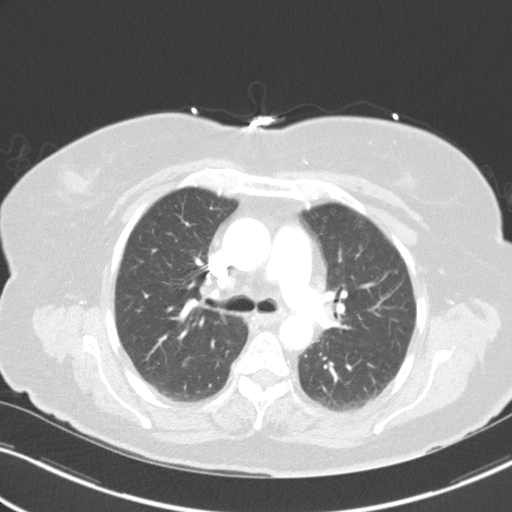
[im 64/86  mediastinal]
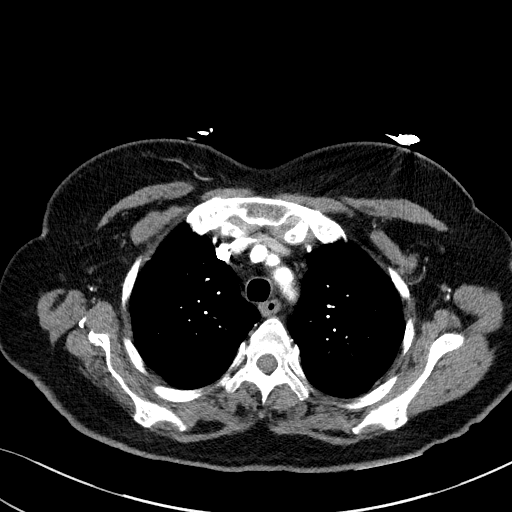
[im 75/86  lung]
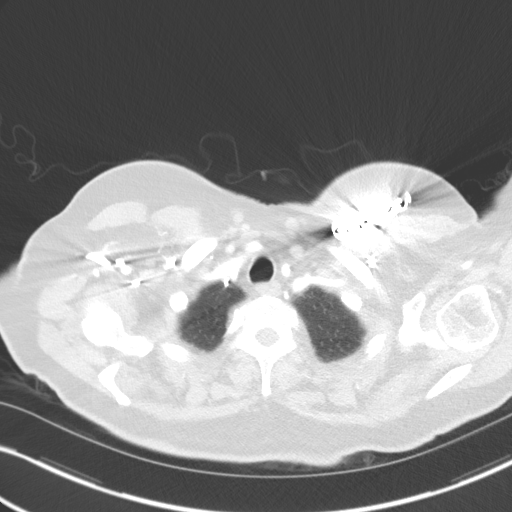

[Series 7: lung · axial · 0.68mm/px · z∈[-592,-414]mm · 7 of 129 slices shown]
[im 10/129  mediastinal]
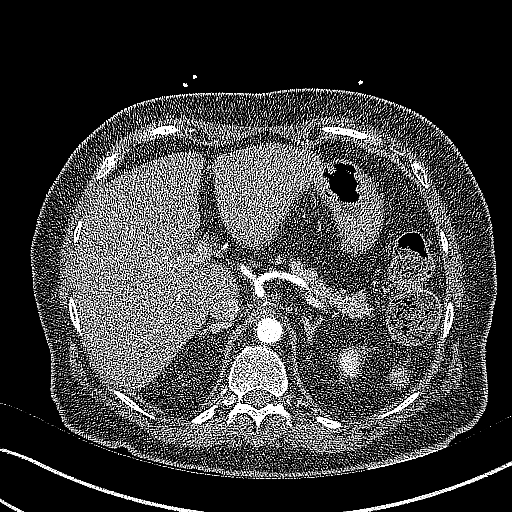
[im 30/129  mediastinal]
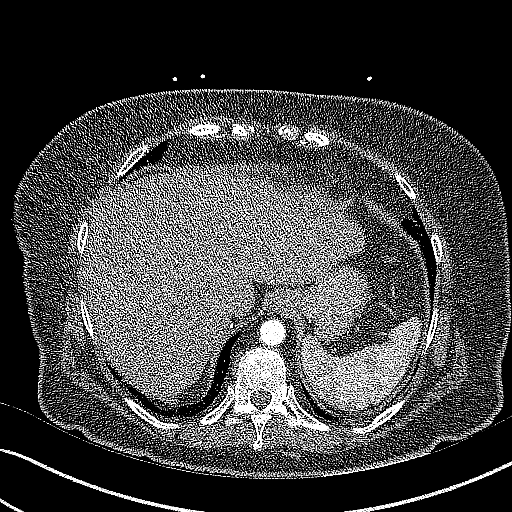
[im 40/129  mediastinal]
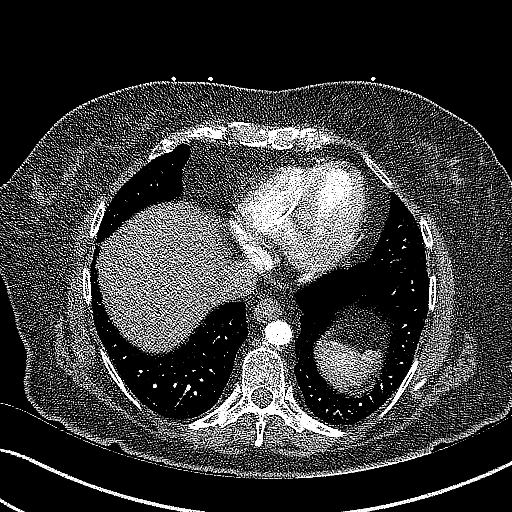
[im 60/129  mediastinal]
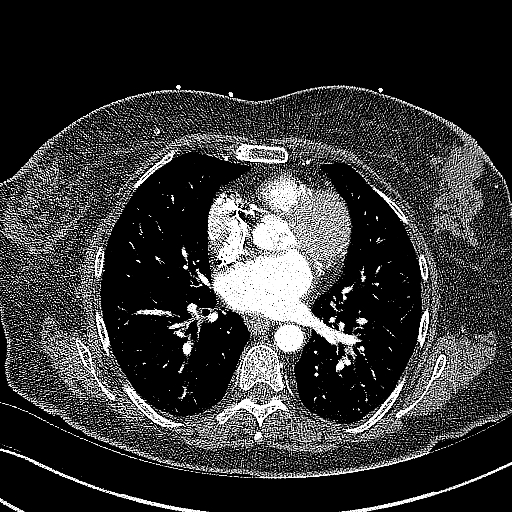
[im 69/129  mediastinal]
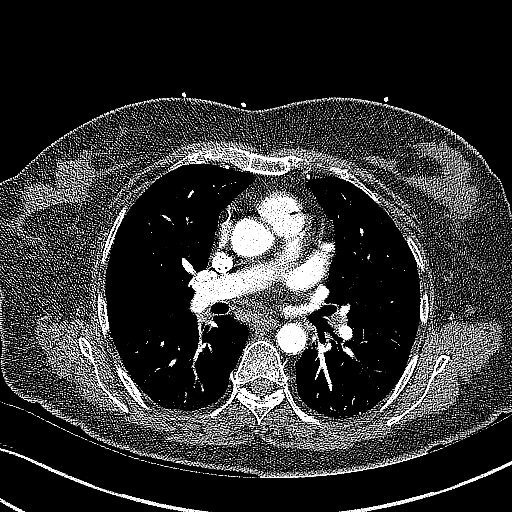
[im 89/129  mediastinal]
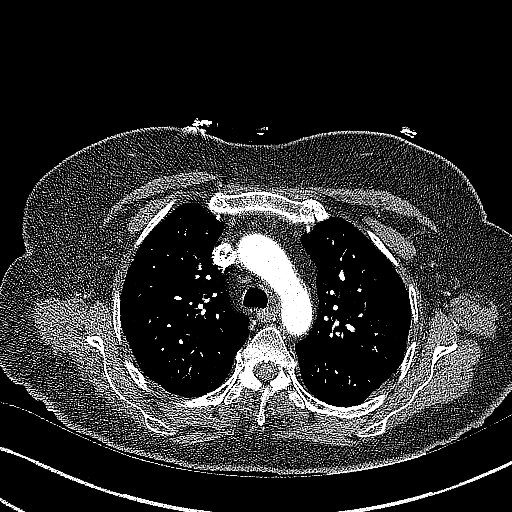
[im 99/129  mediastinal]
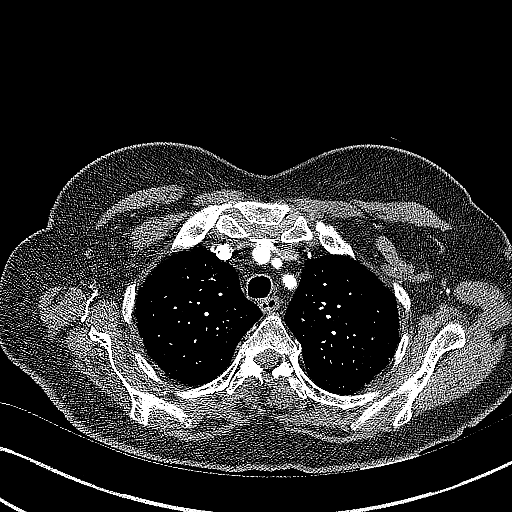

[Series 8: coronals · coronal · 0.54mm/px · 1 of 126 slices shown]
[im 63/126  mediastinal]
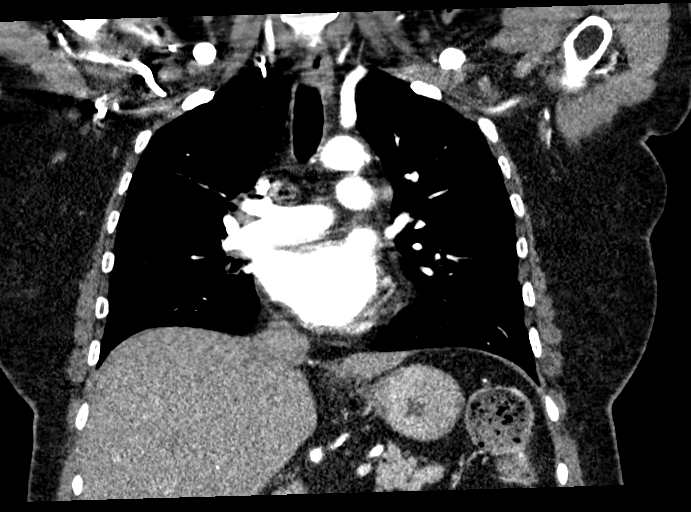

[18 of 36 positions shown; findings below may reference images not displayed]

FINDINGS: Cardiovascular: There is no central pulmonary embolus. The main
pulmonary artery is within normal limits for size. There is a normal
3-vessel arch branching pattern without evidence of acute aortic
syndrome. There is mild aortic atherosclerosis. Heart size is
normal, without pericardial effusion. Pacemaker leads in the right
atrium and right ventricle.

Mediastinum/Nodes: No mediastinal, hilar or axillary
lymphadenopathy. The visualized thyroid and thoracic esophageal
course are unremarkable.

Lungs/Pleura: No pulmonary nodules or masses. No pleural effusion or
pneumothorax. No focal airspace consolidation. No focal pleural
abnormality.

Upper Abdomen: Contrast bolus timing is not optimized for evaluation
of the abdominal organs. Within this limitation, the visualized
organs of the upper abdomen are normal.

Musculoskeletal: No chest wall abnormality. No acute or significant
osseous findings.

Review of the MIP images confirms the above findings.
IMPRESSION: 1. No acute aortic syndrome, central pulmonary embolus or other
acute thoracic abnormality.
2.  Aortic Atherosclerosis ([id number]).

## 2018-03-06 IMAGING — CR DG CHEST 2V
1 series · 2 of 2 positions shown · non-contrast
Comparison: 04/02/2015

CLINICAL DATA: 71-year-old with central chest heaviness.

EXAM:
CHEST  2 VIEW

[Series 1: w chest pa · 0.14mm/px · 2 of 2 slices shown]
[im 1/2]
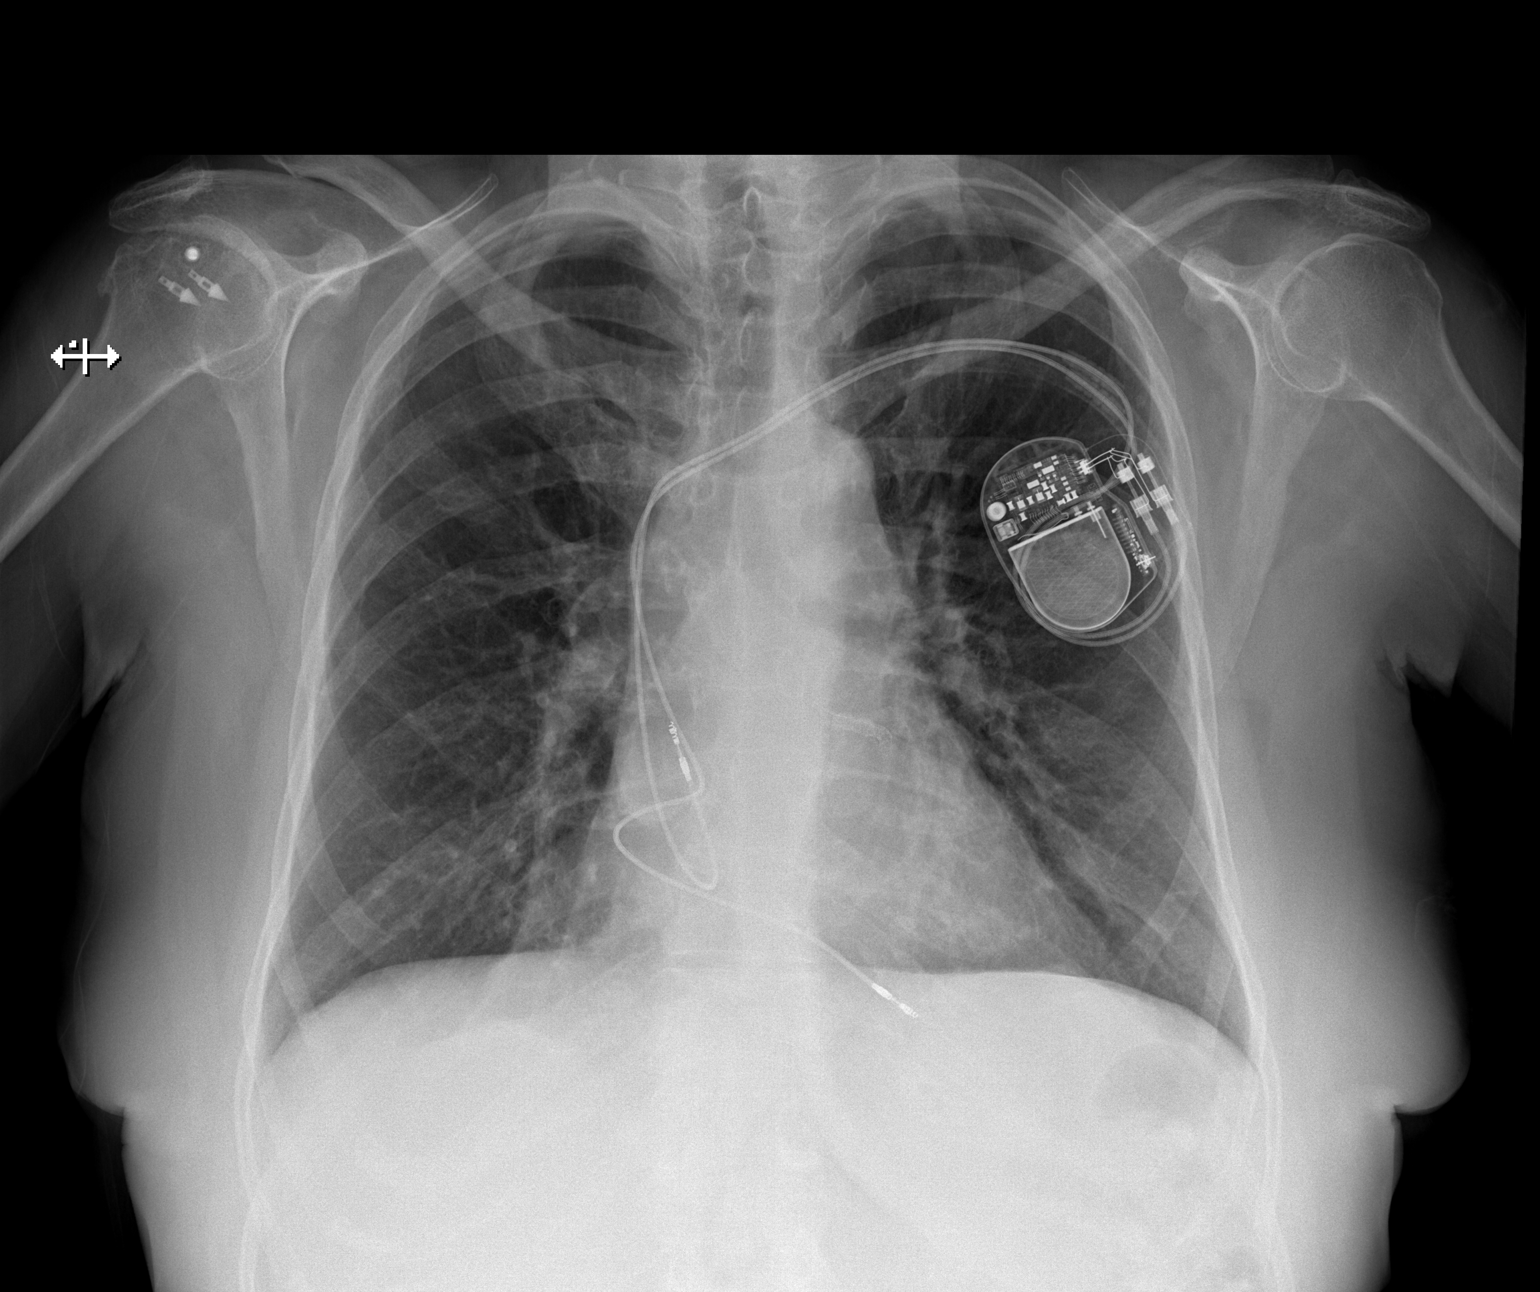
[im 2/2]
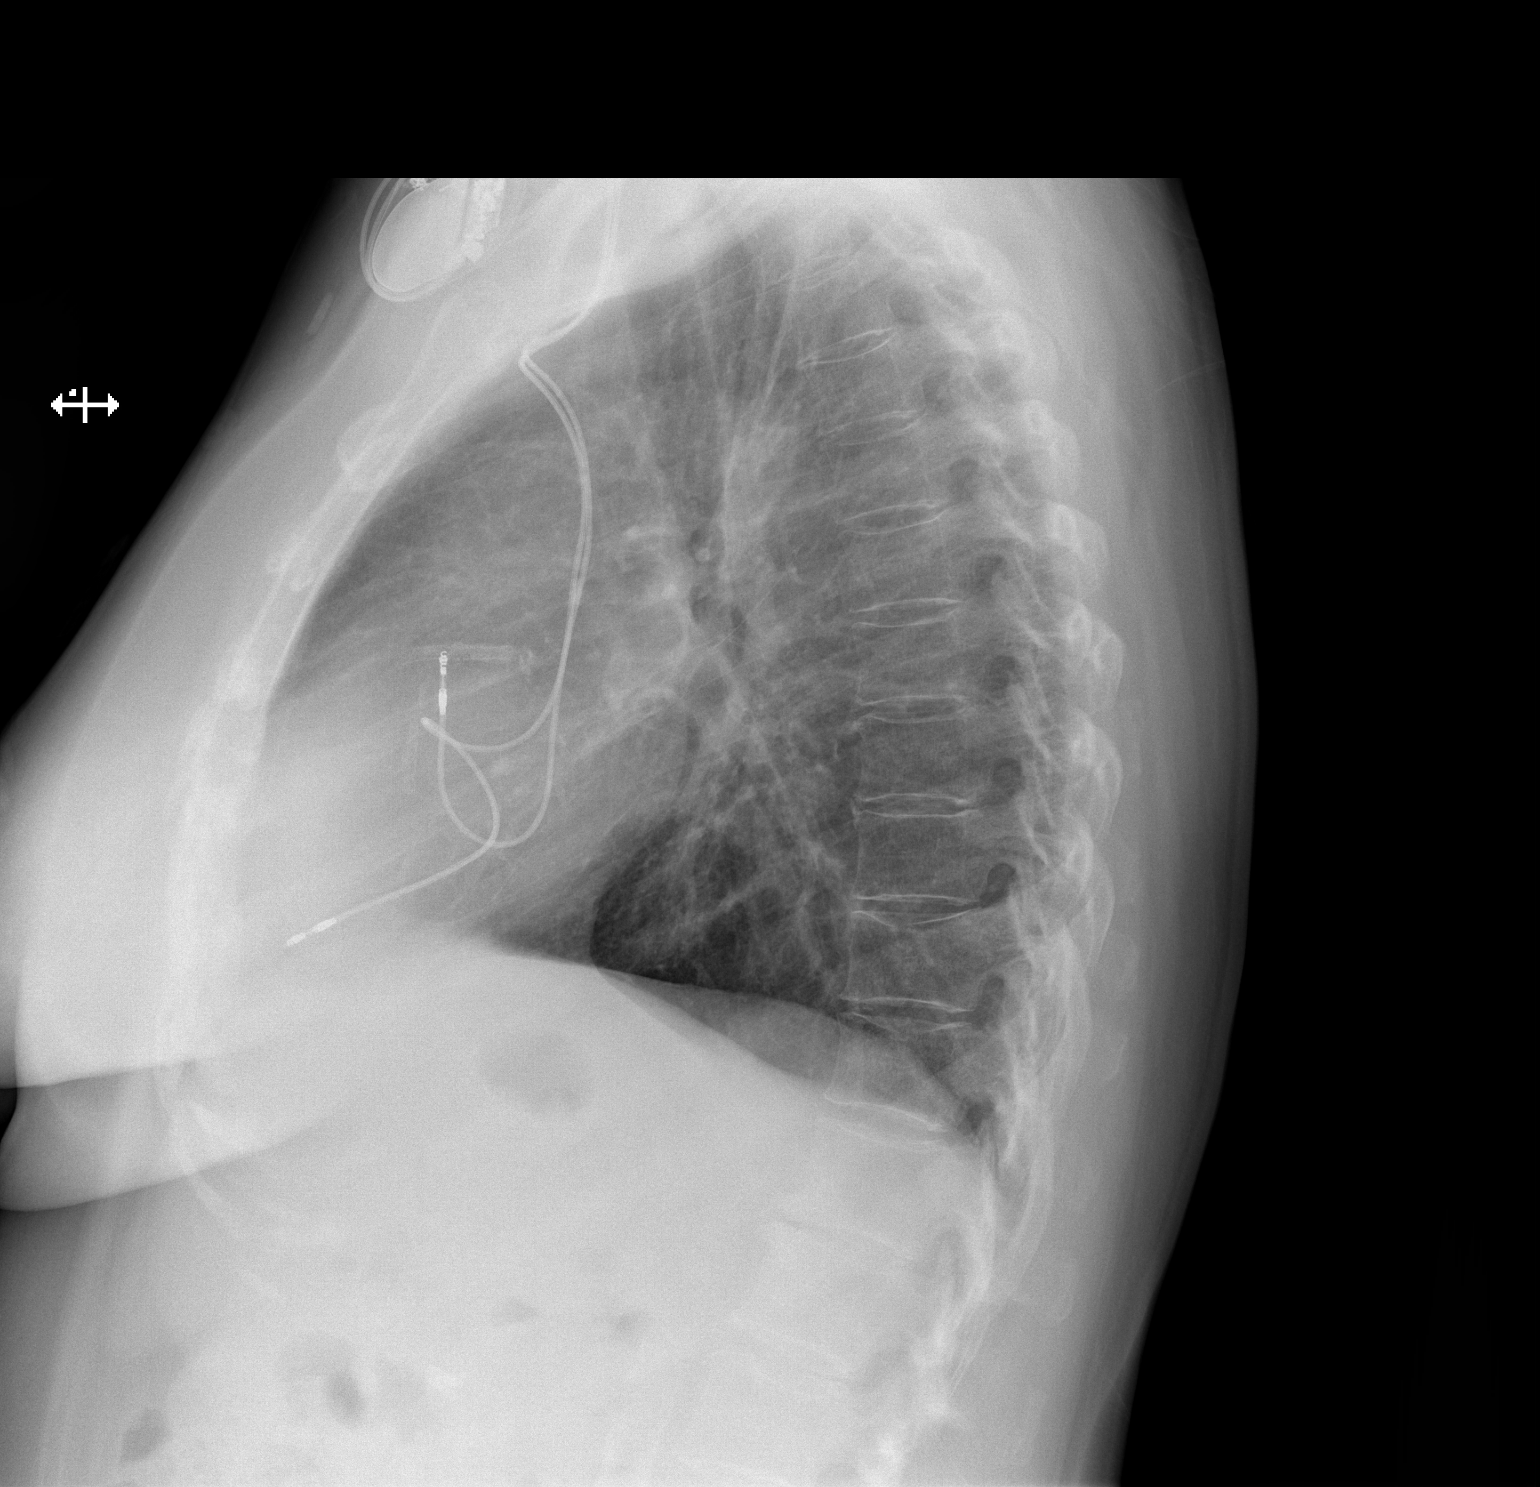

[2 of 2 positions shown; findings below may reference images not displayed]

FINDINGS: Stable position of the left dual chamber cardiac pacemaker. Heart
size is normal. Lungs are clear without focal airspace disease or
pulmonary edema. Surgical anchors in the right humeral head. No
pleural effusions.
IMPRESSION: No active cardiopulmonary disease.

## 2018-03-11 IMAGING — CR DG SI JOINTS 3+V
1 series · 3 of 3 positions shown · non-contrast
Comparison: None.

CLINICAL DATA: Chronic bilateral hip pain

EXAM:
BILATERAL SACROILIAC JOINTS - 3+ VIEW

[Series 1: dg si joints · 0.14mm/px · 3 of 3 slices shown]
[im 1/3]
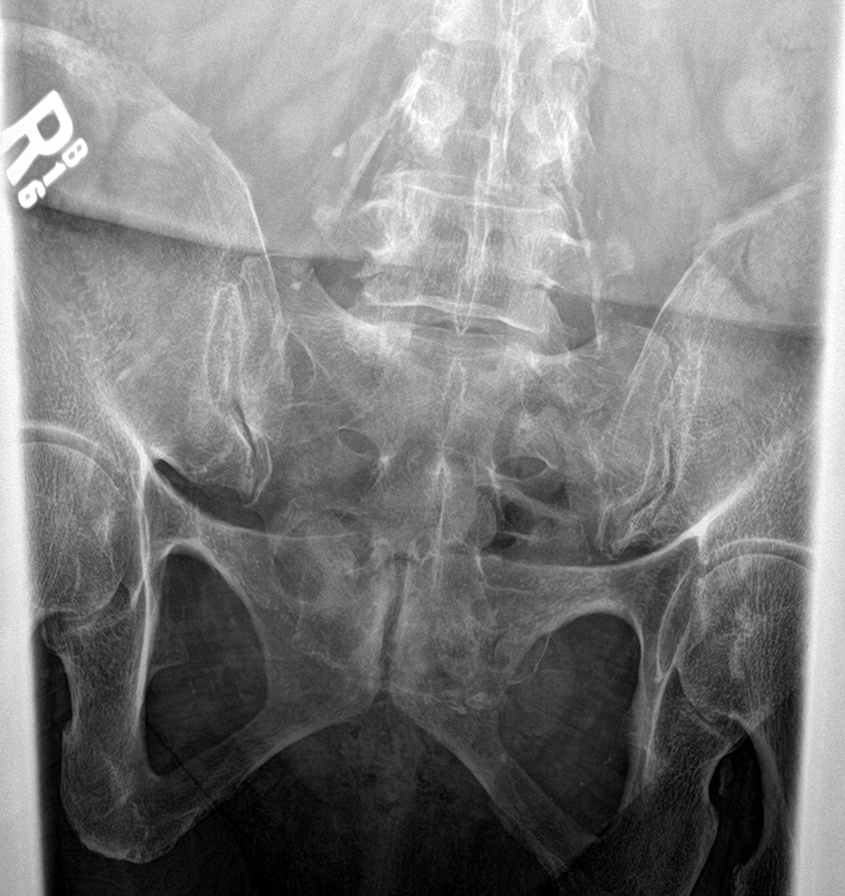
[im 2/3]
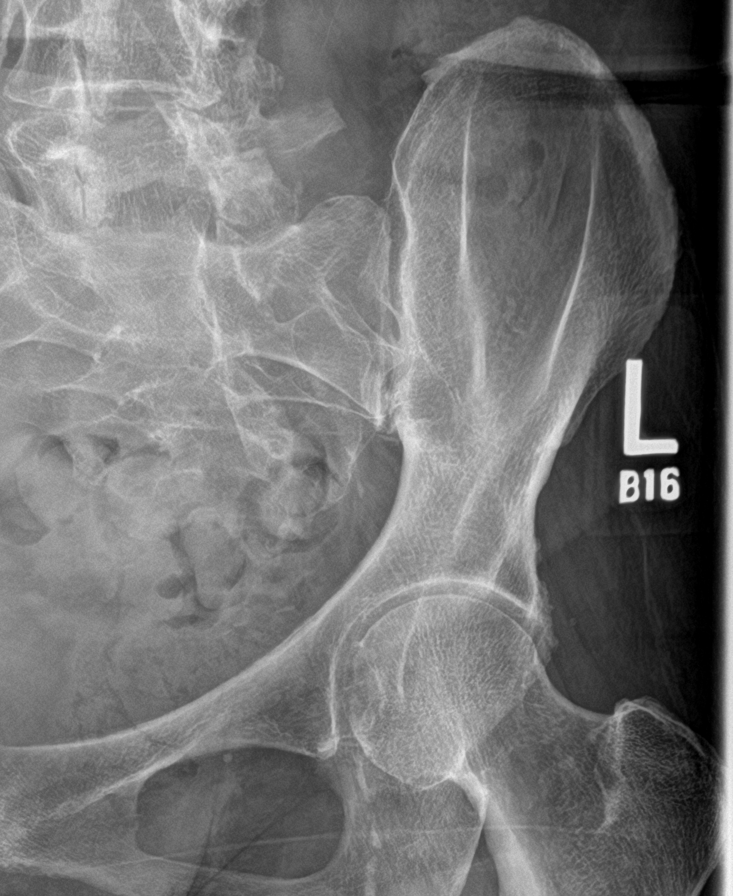
[im 3/3]
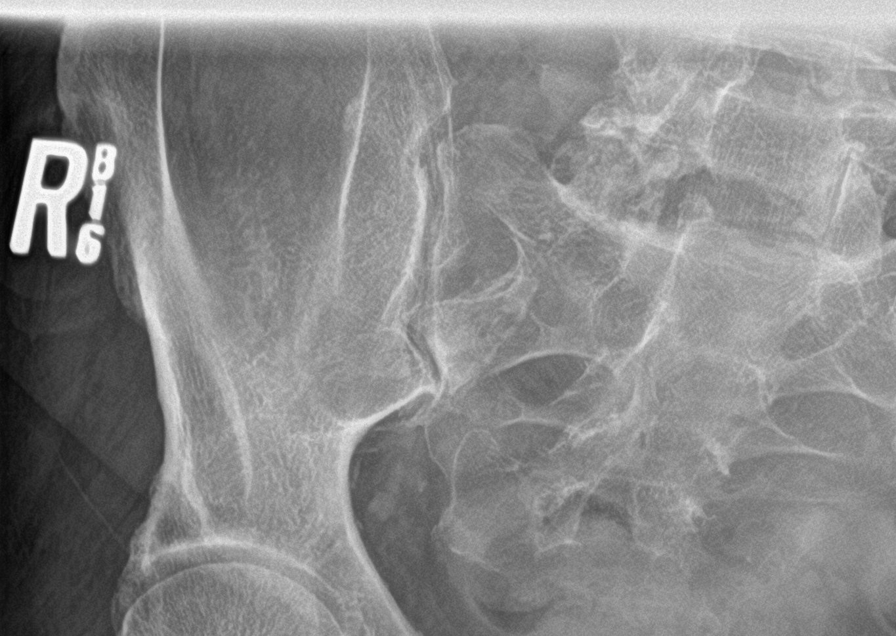

[3 of 3 positions shown; findings below may reference images not displayed]

FINDINGS: There are small osteophytes along the inferior margin of the
sacroiliac joints and mild sclerosis on either side of the joint
most consistent with mild osteoarthritis of bilateral sacroiliac
joints. No SI joint widening or erosive changes. No other bone
abnormalities are seen.
IMPRESSION: Mild osteoarthritis of bilateral sacroiliac joints.

## 2018-03-11 IMAGING — CR DG LUMBAR SPINE COMPLETE W/ BEND
1 series · 7 of 7 positions shown · non-contrast
Comparison: None.

CLINICAL DATA: Please report on the following: (1) ROM AND
instability (>4mm displacement) (2) Zygoapophyseal joint (Facet
Joint) (3) DDD and/or IDDD (4) Fractures (5) Demineralization (6)
Aditional bone pathology Chronic bilateral lumbar pain with
bilateral sciatica

EXAM:
LUMBAR SPINE - COMPLETE WITH BENDING VIEWS

[Series 1: dg lumbar spine complete w/bend 6+v · 0.14mm/px · 7 of 7 slices shown]
[im 1/7]
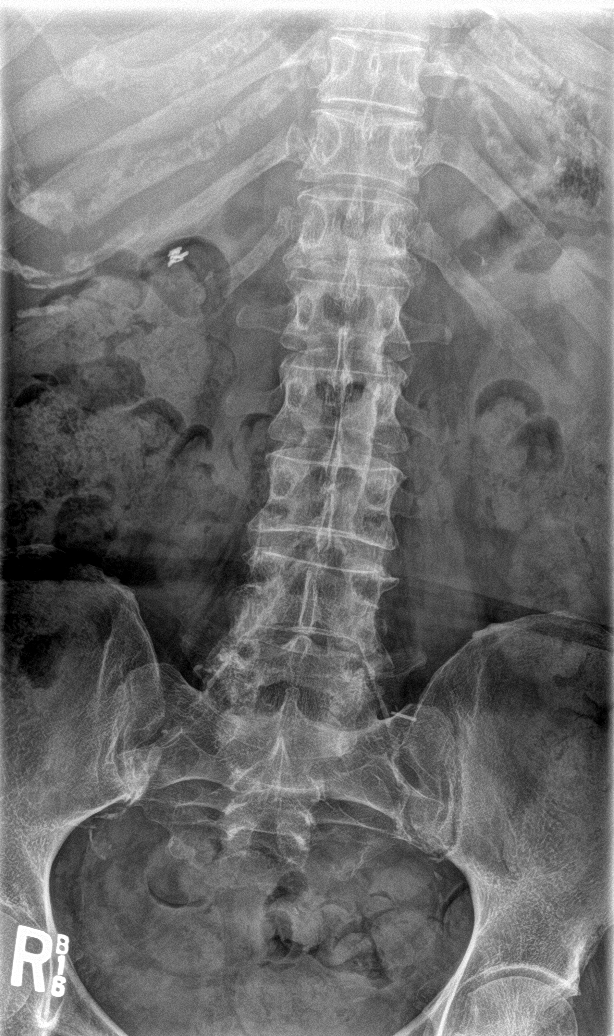
[im 2/7]
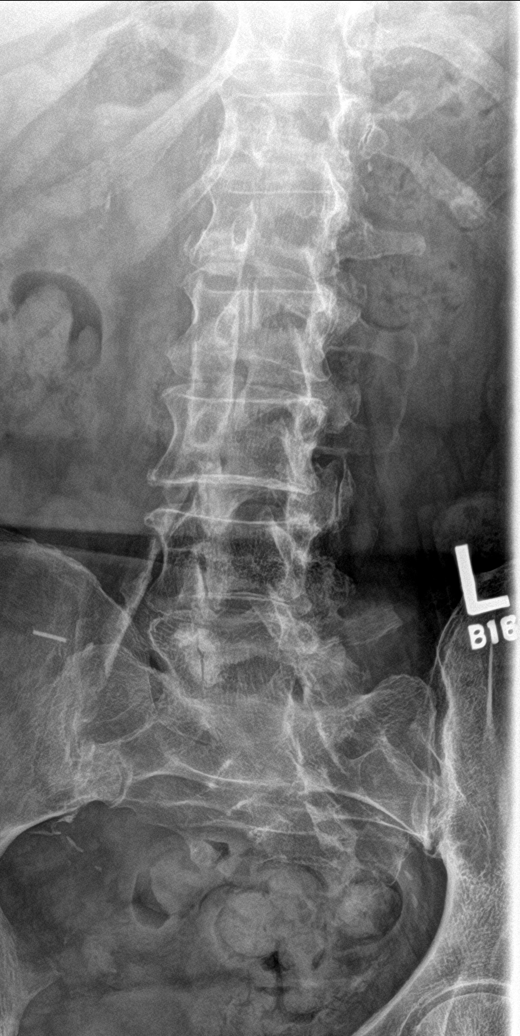
[im 3/7]
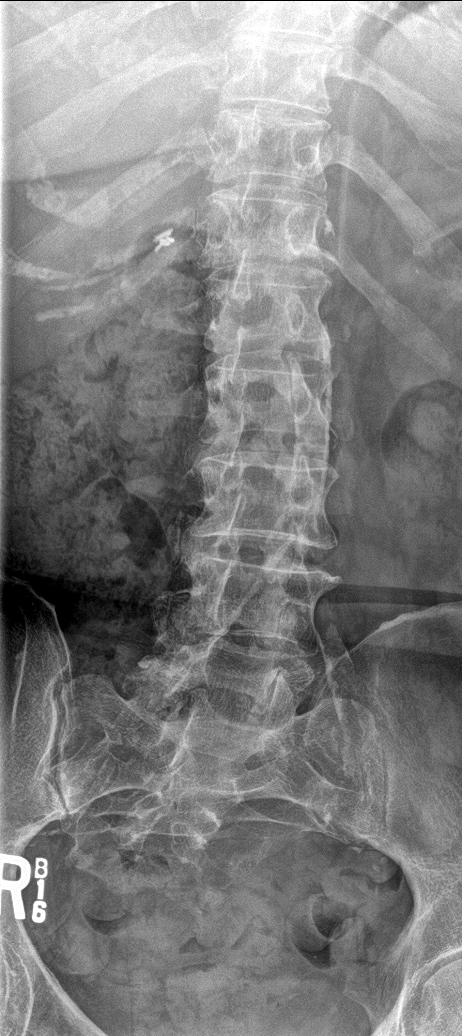
[im 4/7]
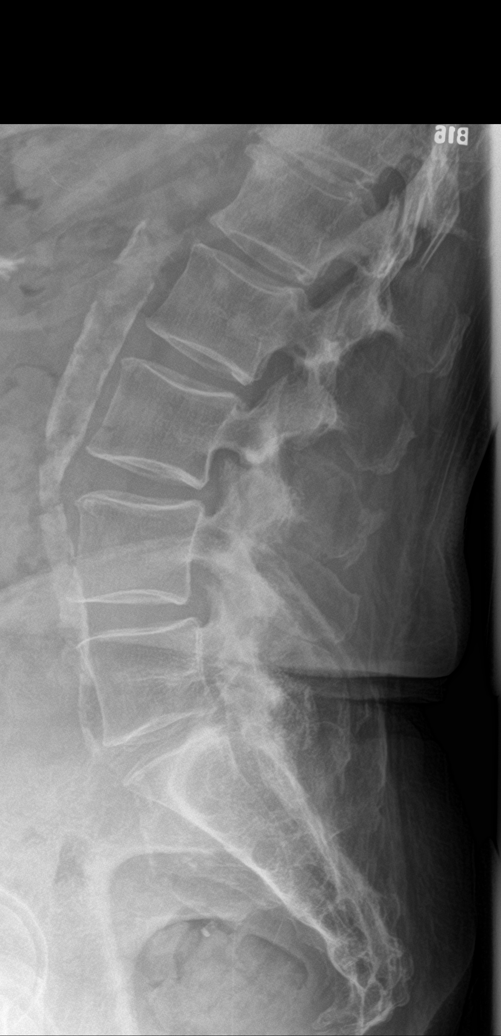
[im 5/7]
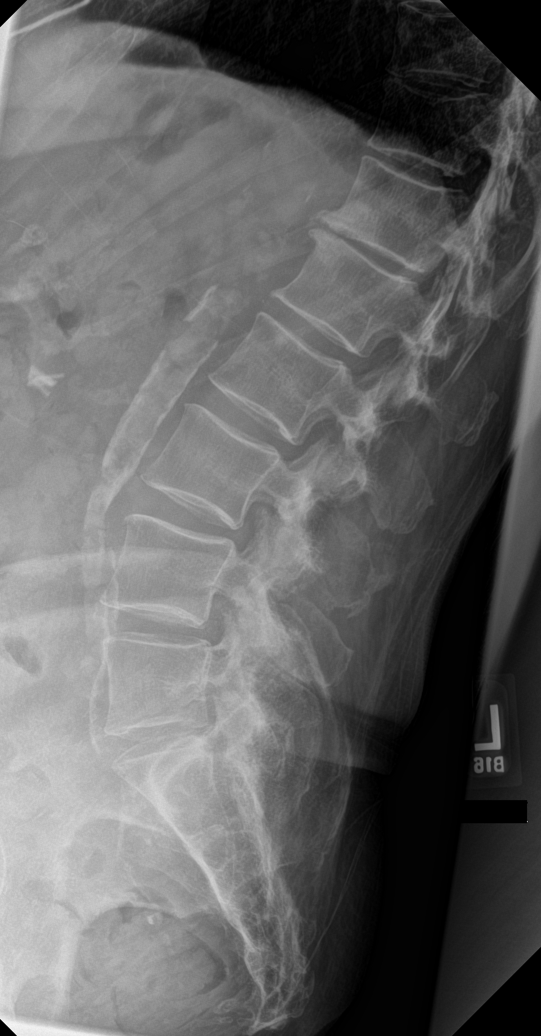
[im 6/7]
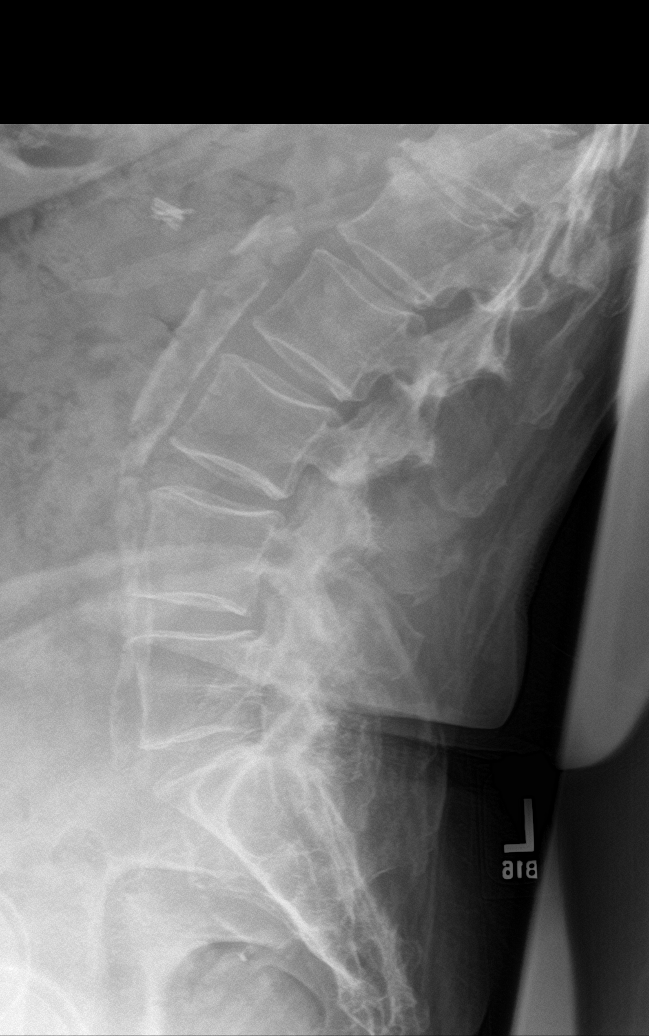
[im 7/7]
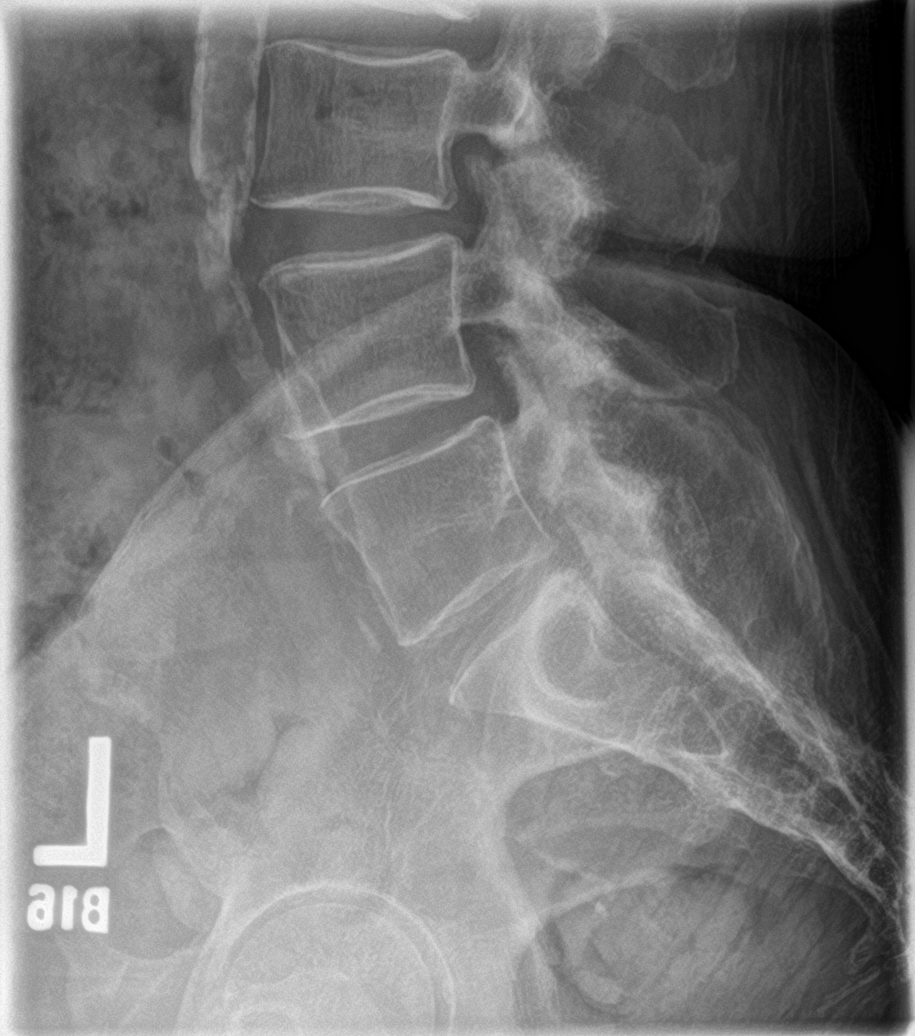

[7 of 7 positions shown; findings below may reference images not displayed]

FINDINGS: Normal alignment of lumbar vertebral bodies. No loss of vertebral
body height or disc height. No pars fracture. No subluxation.
Minimal endplate osteophytosis at T12-L1.

Atherosclerotic calcification of the aorta.
IMPRESSION: No acute findings lumbar spine.

Mild disc osteophytic disease at T12-L1.

Aortic Atherosclerosis ([id number]).

## 2018-04-01 DIAGNOSIS — R200 Anesthesia of skin: Secondary | ICD-10-CM | POA: Insufficient documentation

## 2018-04-01 DIAGNOSIS — M79642 Pain in left hand: Secondary | ICD-10-CM | POA: Insufficient documentation

## 2018-04-18 IMAGING — CT CT HEAD CODE STROKE
3 of 6 series · 15 of 47 positions shown, 18 images · non-contrast
Comparison: None available.

CLINICAL DATA: Code stroke. Initial evaluation for possible
seizure, overdose.

EXAM:
CT HEAD WITHOUT CONTRAST
TECHNIQUE: Contiguous axial images were obtained from the base of the skull
through the vertex without intravenous contrast.

[Series 3: head wo · axial · 0.41mm/px · z∈[-154,-19]mm · 9 of 31 slices shown, 12 images]
[im 2/31  brain]
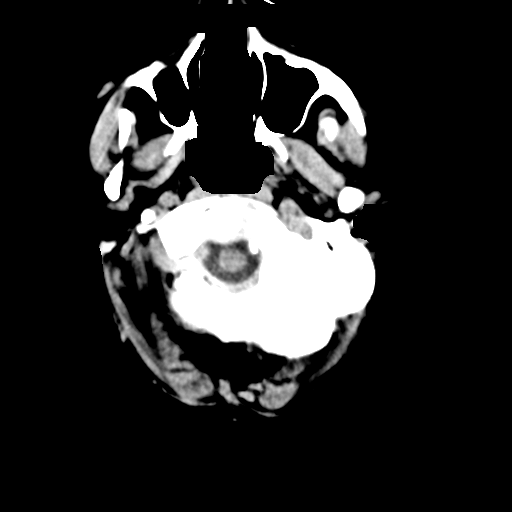
[im 2/31  bone]
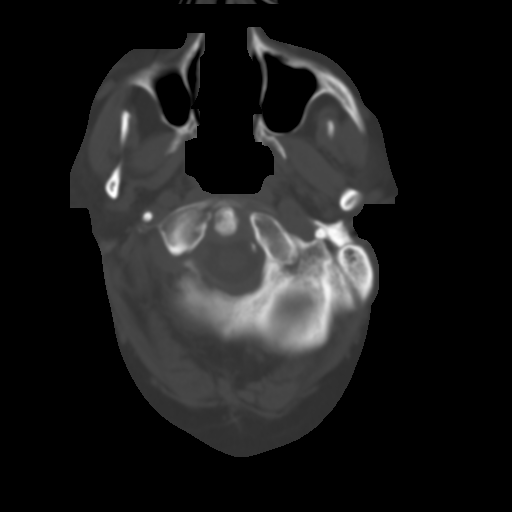
[im 6/31  brain]
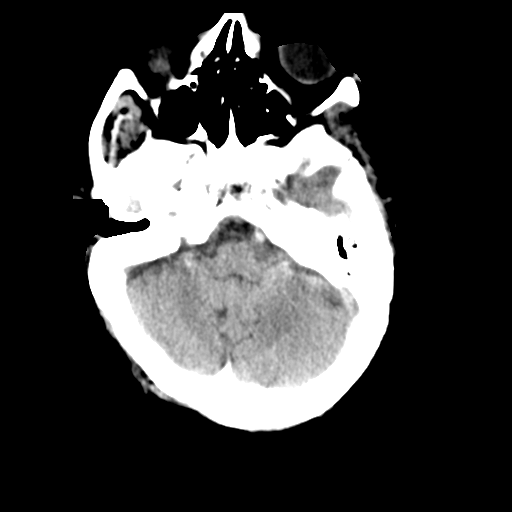
[im 9/31  brain]
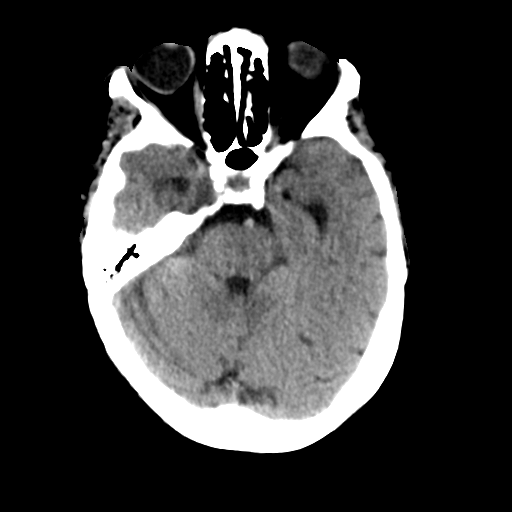
[im 12/31  brain]
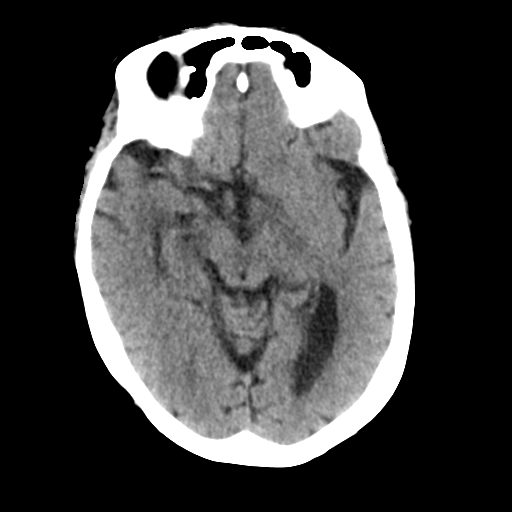
[im 16/31  brain]
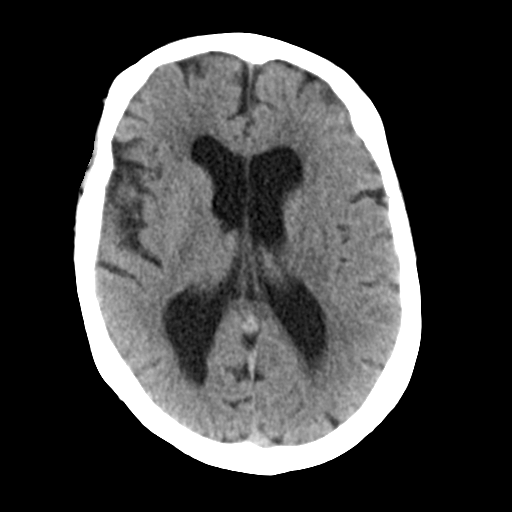
[im 16/31  bone]
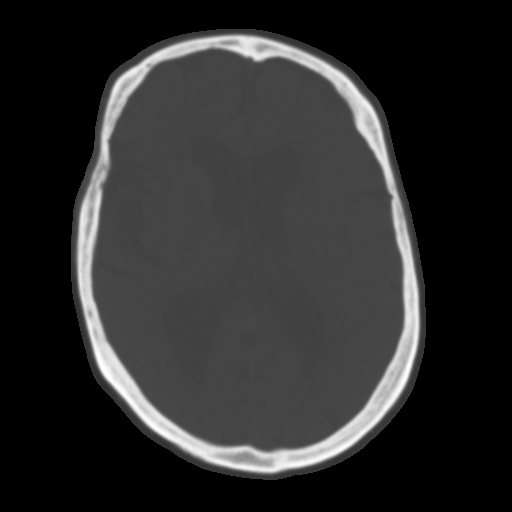
[im 19/31  brain]
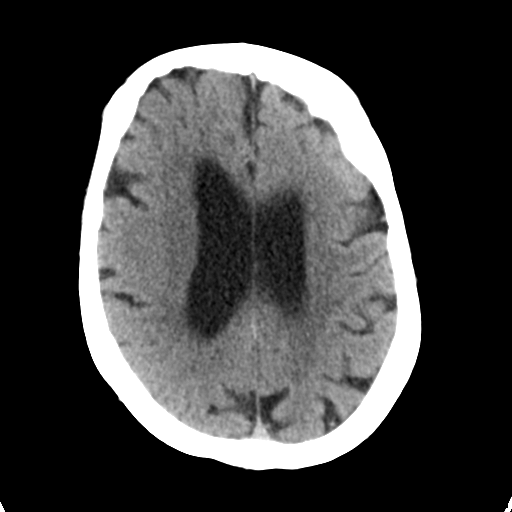
[im 22/31  brain]
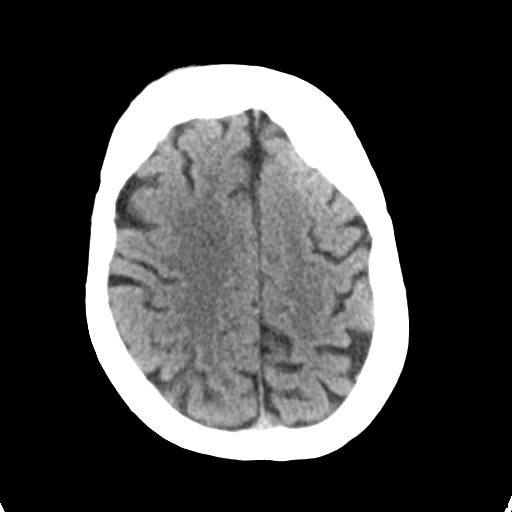
[im 26/31  brain]
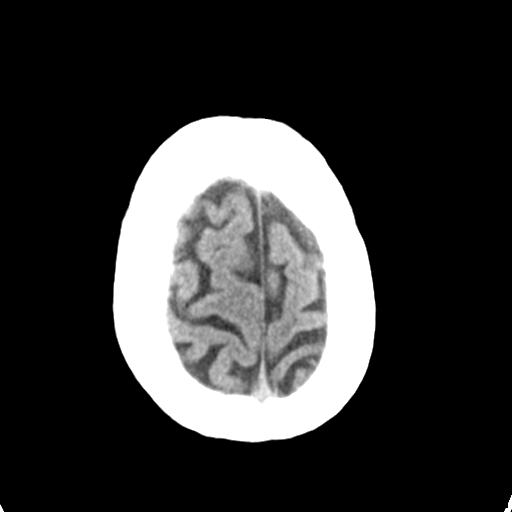
[im 29/31  brain]
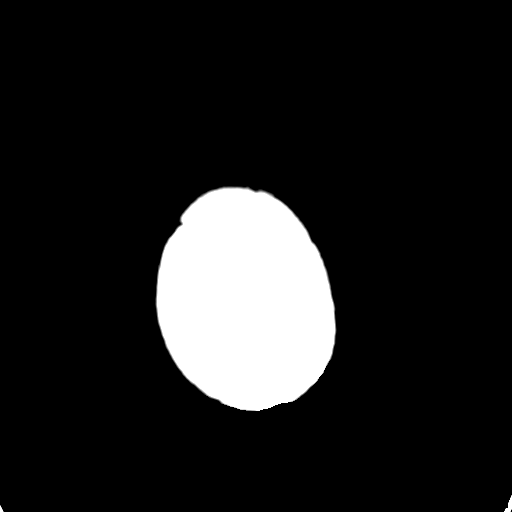
[im 29/31  bone]
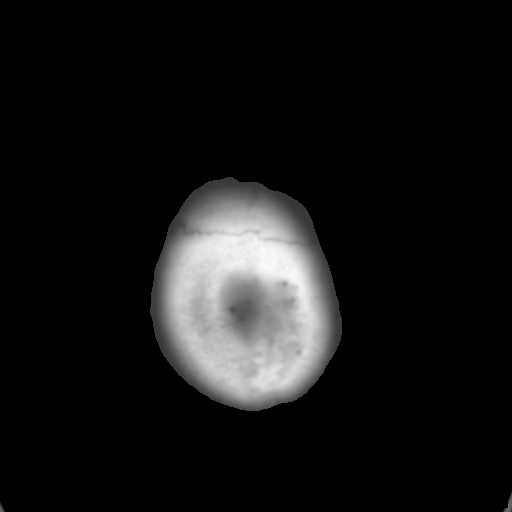

[Series 5: coronal soft tissue · coronal · 0.30mm/px · 3 of 63 slices shown]
[im 16/63  brain]
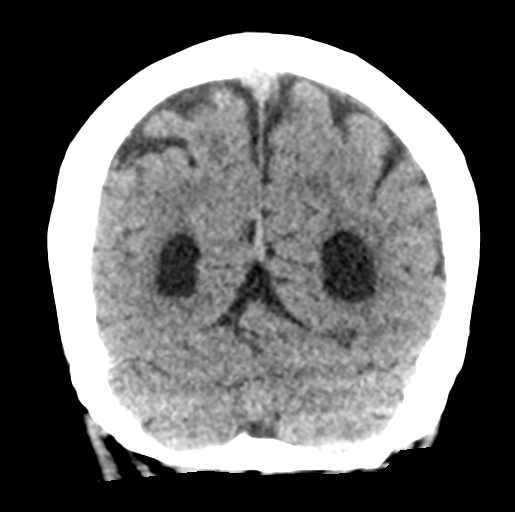
[im 32/63  brain]
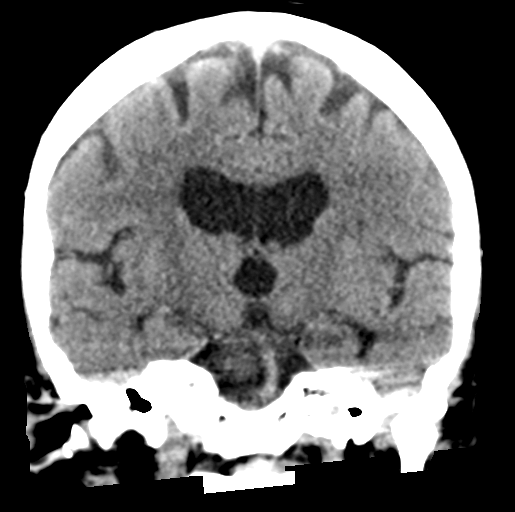
[im 47/63  brain]
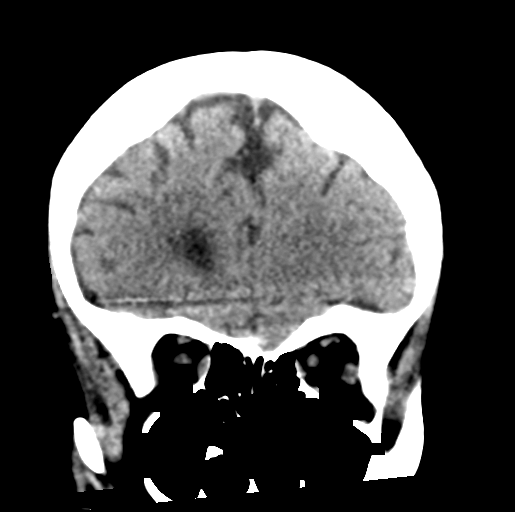

[Series 10: sagittal soft tissue · sagittal · 0.29mm/px · 3 of 48 slices shown]
[im 22/48  brain]
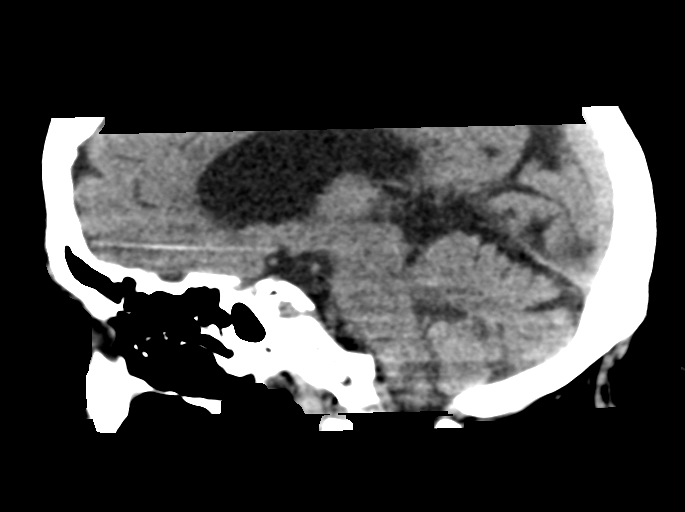
[im 30/48  brain]
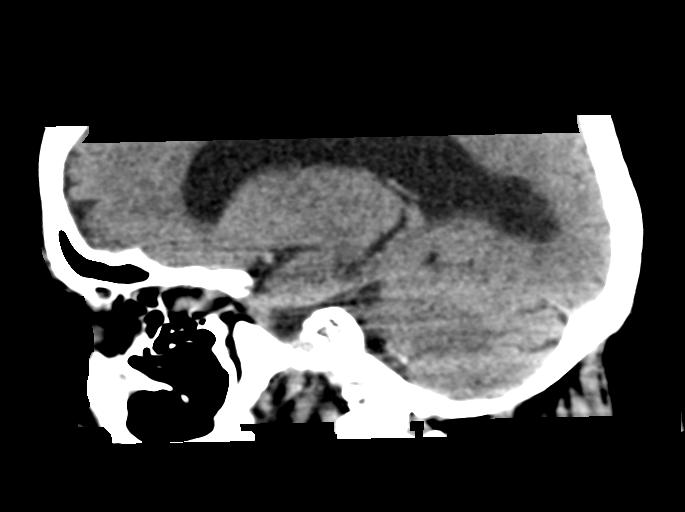
[im 39/48  brain]
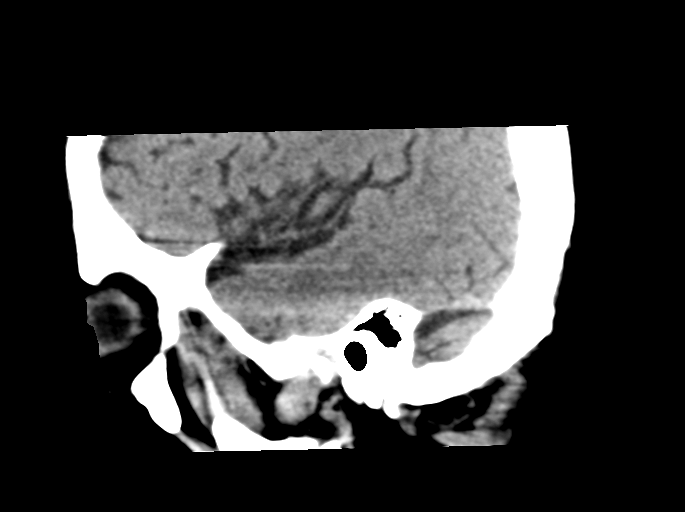

[15 of 47 positions shown; findings below may reference images not displayed]

FINDINGS: Brain: Generalized age-related cerebral atrophy with mild chronic
small vessel ischemic disease. No acute intracranial hemorrhage. No
acute large vessel territory infarct. No mass lesion, midline shift,
or mass effect. Ventricular prominence related to global parenchymal
volume loss of hydrocephalus. No extra-axial fluid collection.

Vascular: No hyperdense vessel. Calcified atherosclerosis at the
skull base.

Skull: Scalp soft tissues and calvarium within normal limits.

Sinuses/Orbits: Visualized paranasal sinuses are clear. No mastoid
effusion.

Other: Globes and orbital soft tissues within normal limits. Patient
status post cataract extraction on the left.

ASPECTS (Alberta Stroke Program Early CT Score)

- Ganglionic level infarction (caudate, lentiform nuclei, internal
capsule, insula, M1-M3 cortex): 7

- Supraganglionic infarction (M4-M6 cortex): 3

Total score (0-10 with 10 being normal): 10
IMPRESSION: 1. No acute intracranial infarct or other process identified.
2. ASPECTS is 10.
3. Age-related cerebral atrophy with mild chronic small vessel
ischemic disease.

Critical Value/emergent results were called by telephone at the time
of interpretation on 05/10/2017 at [DATE] to Dr. DONNIE WHITE , who
verbally acknowledged these results.

## 2018-04-21 IMAGING — DX DG ABDOMEN 1V
2 series · 2 of 2 positions shown · non-contrast
Comparison: 04/02/2017

CLINICAL DATA: Abdominal distention

EXAM:
ABDOMEN - 1 VIEW

[abdomen kub (1 of 2)]
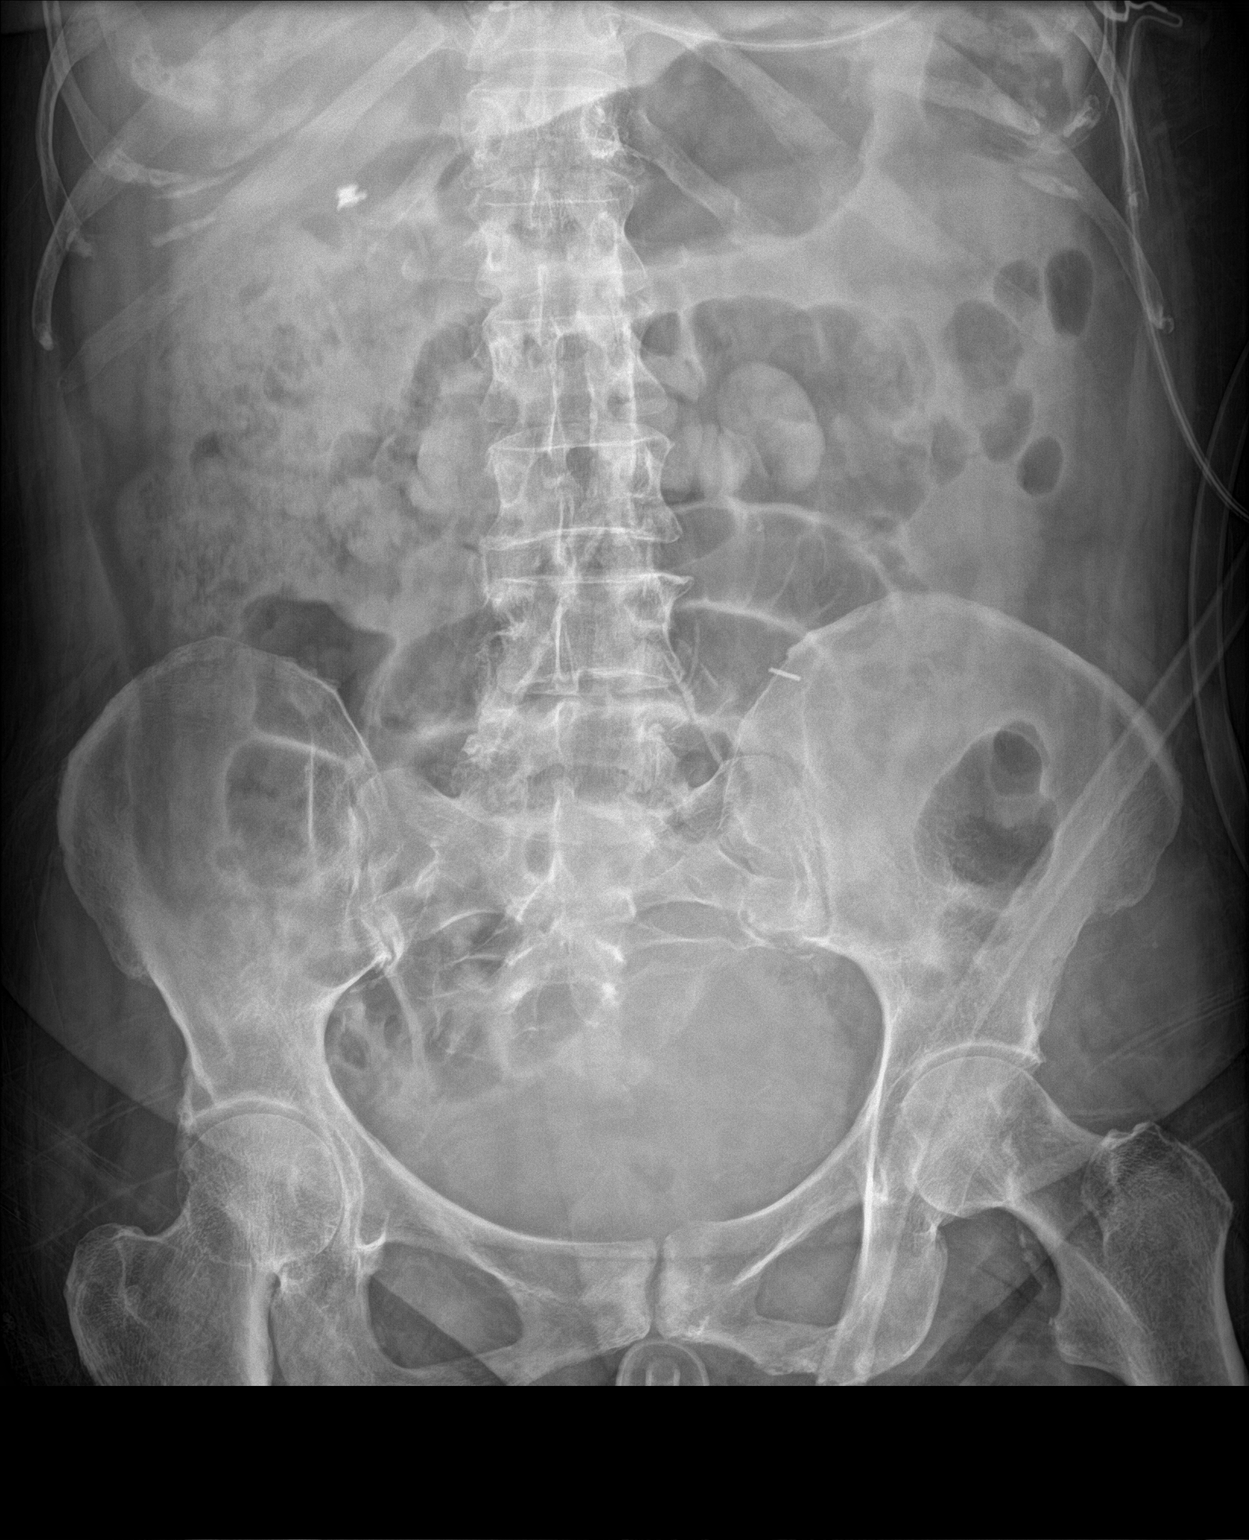

[abdomen kub (2 of 2)]
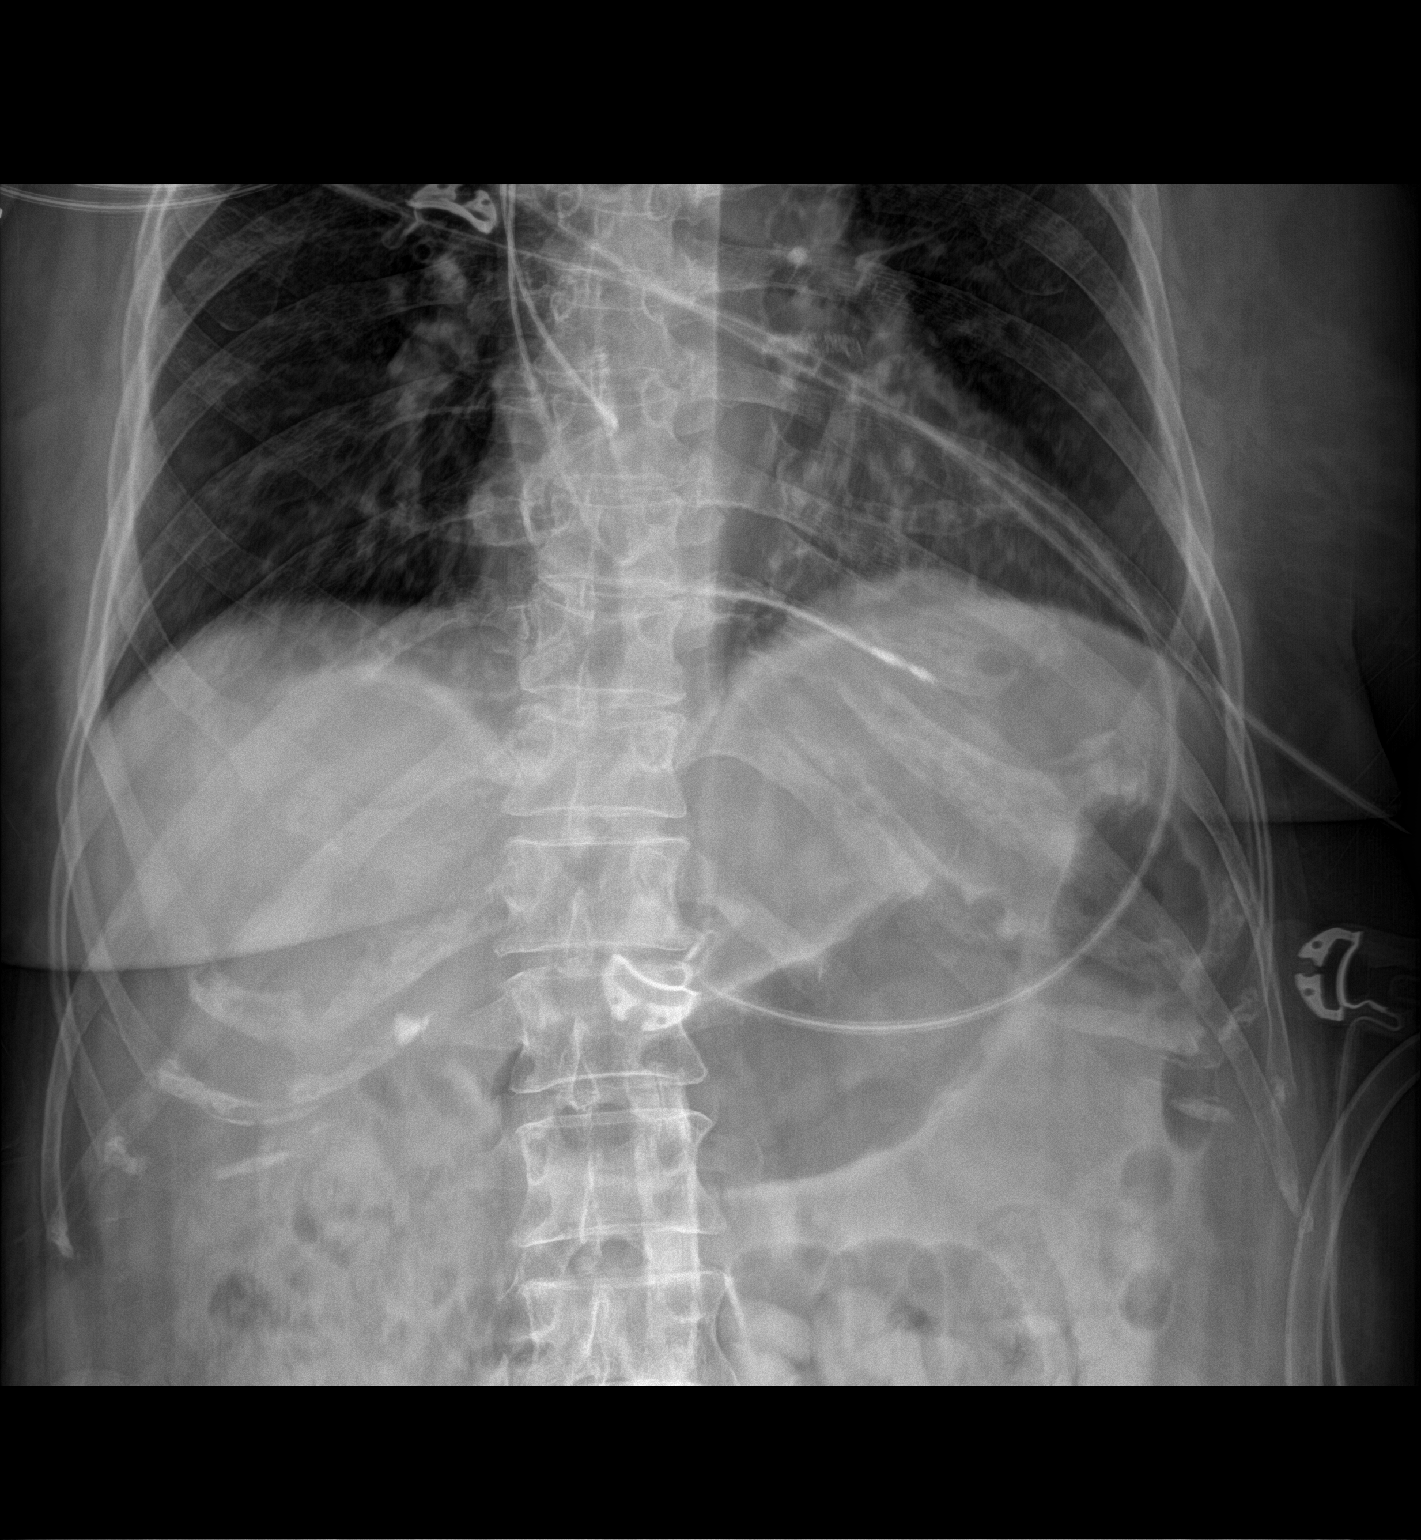

[2 of 2 positions shown; findings below may reference images not displayed]

FINDINGS: Mild gaseous distention of centralized bowel loops including the
transverse colon and several small bowel loops. Moderate to large
stool burden in the right colon. Prior cholecystectomy. No free air
organomegaly. No suspicious calcification.
IMPRESSION: Mild gaseous distention of centralized bowel loops including
transverse colon and small bowel loops. This could reflect focal
ileus.

Moderate stool burden.

## 2018-04-24 IMAGING — DX DG ABD PORTABLE 1V
1 series · 1 of 1 positions shown · non-contrast
Comparison: 05/13/2017, 04/02/2017

CLINICAL DATA: 71-year-old female with a history of nasogastric
tube placement

EXAM:
PORTABLE ABDOMEN - 1 VIEW

[abdomen kub]
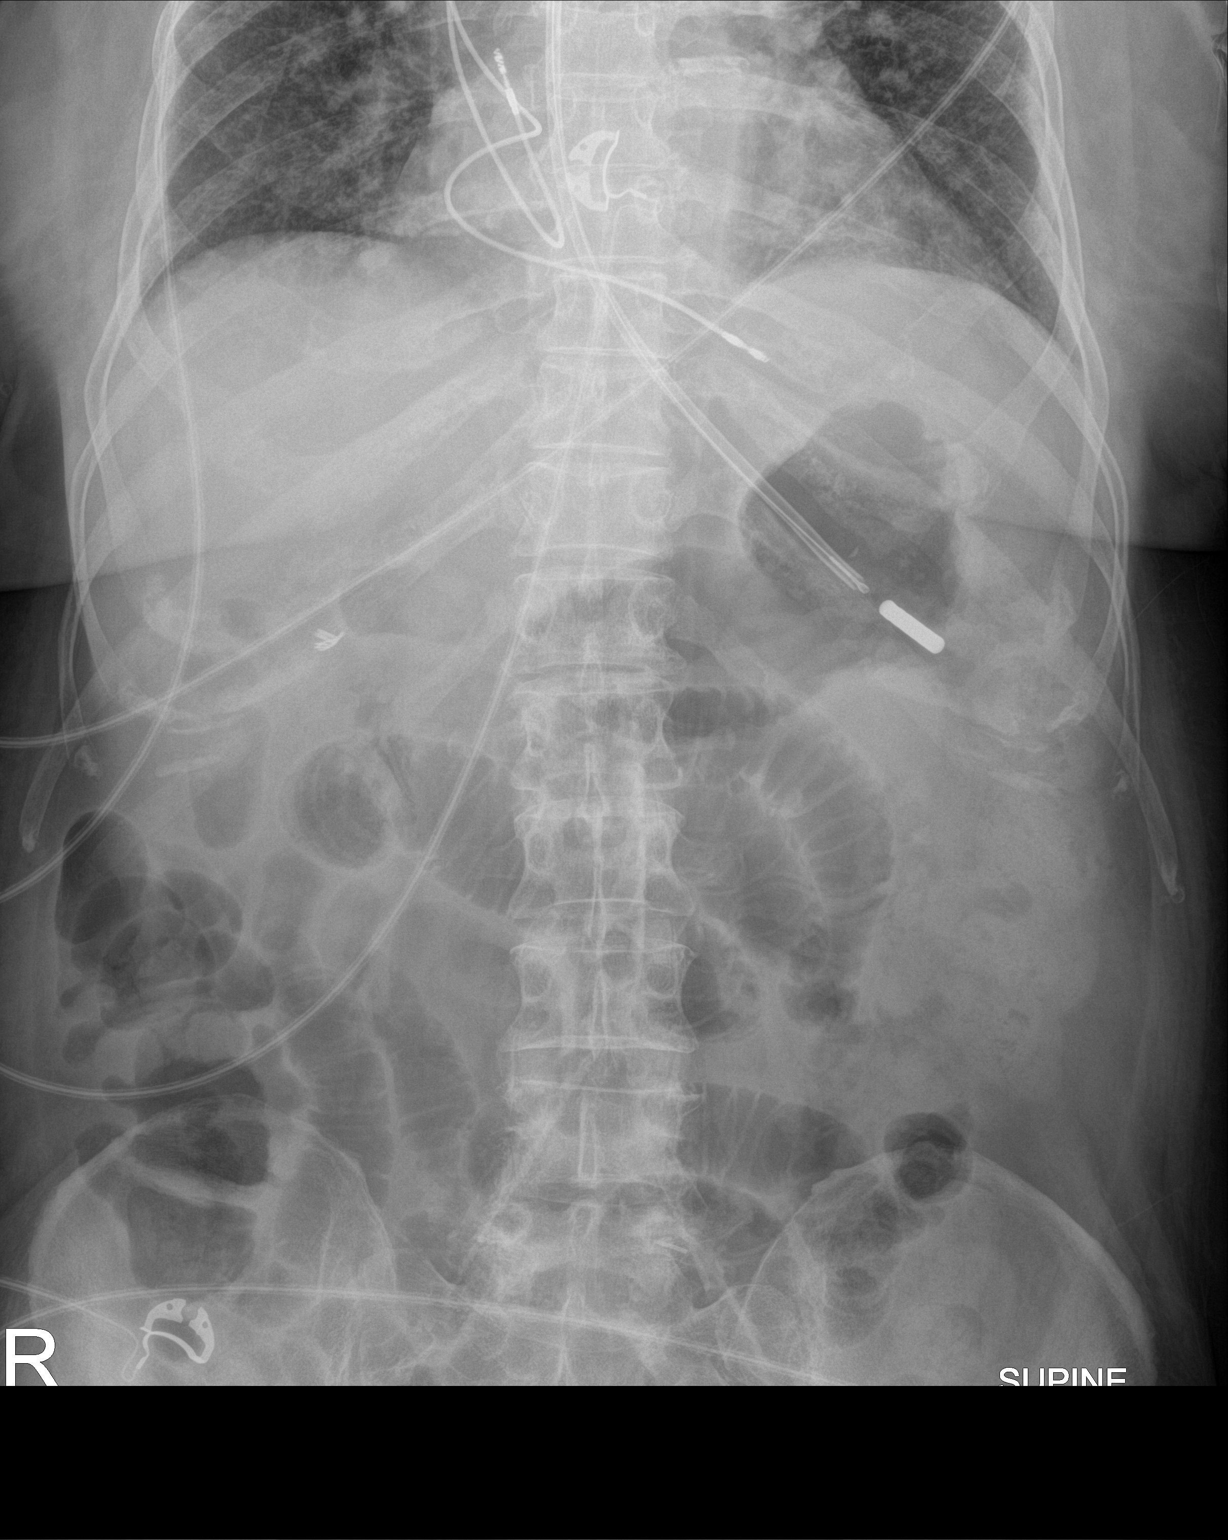

[1 of 1 positions shown; findings below may reference images not displayed]

FINDINGS: Gas within stomach and small bowel, with persistent borderline
dilated small bowel. Gas within the right colon with paucity of gas
in the left colon.

Interval placement of metallic tip enteric feeding tube with coaxial
wire, terminating in the stomach.

Pacing leads incompletely imaged.

Cholecystectomy.

Atherosclerotic calcifications
IMPRESSION: Weighted tip enteric feeding tube terminates within the stomach.

Borderline dilated small bowel loops, potentially ileus.

## 2018-04-26 IMAGING — DX DG ABD PORTABLE 1V
1 series · 1 of 1 positions shown · non-contrast
Comparison: Abdominal x-ray dated May 16, 2017.

CLINICAL DATA: NG tube placement.

EXAM:
PORTABLE ABDOMEN - 1 VIEW

[abdomen kub]
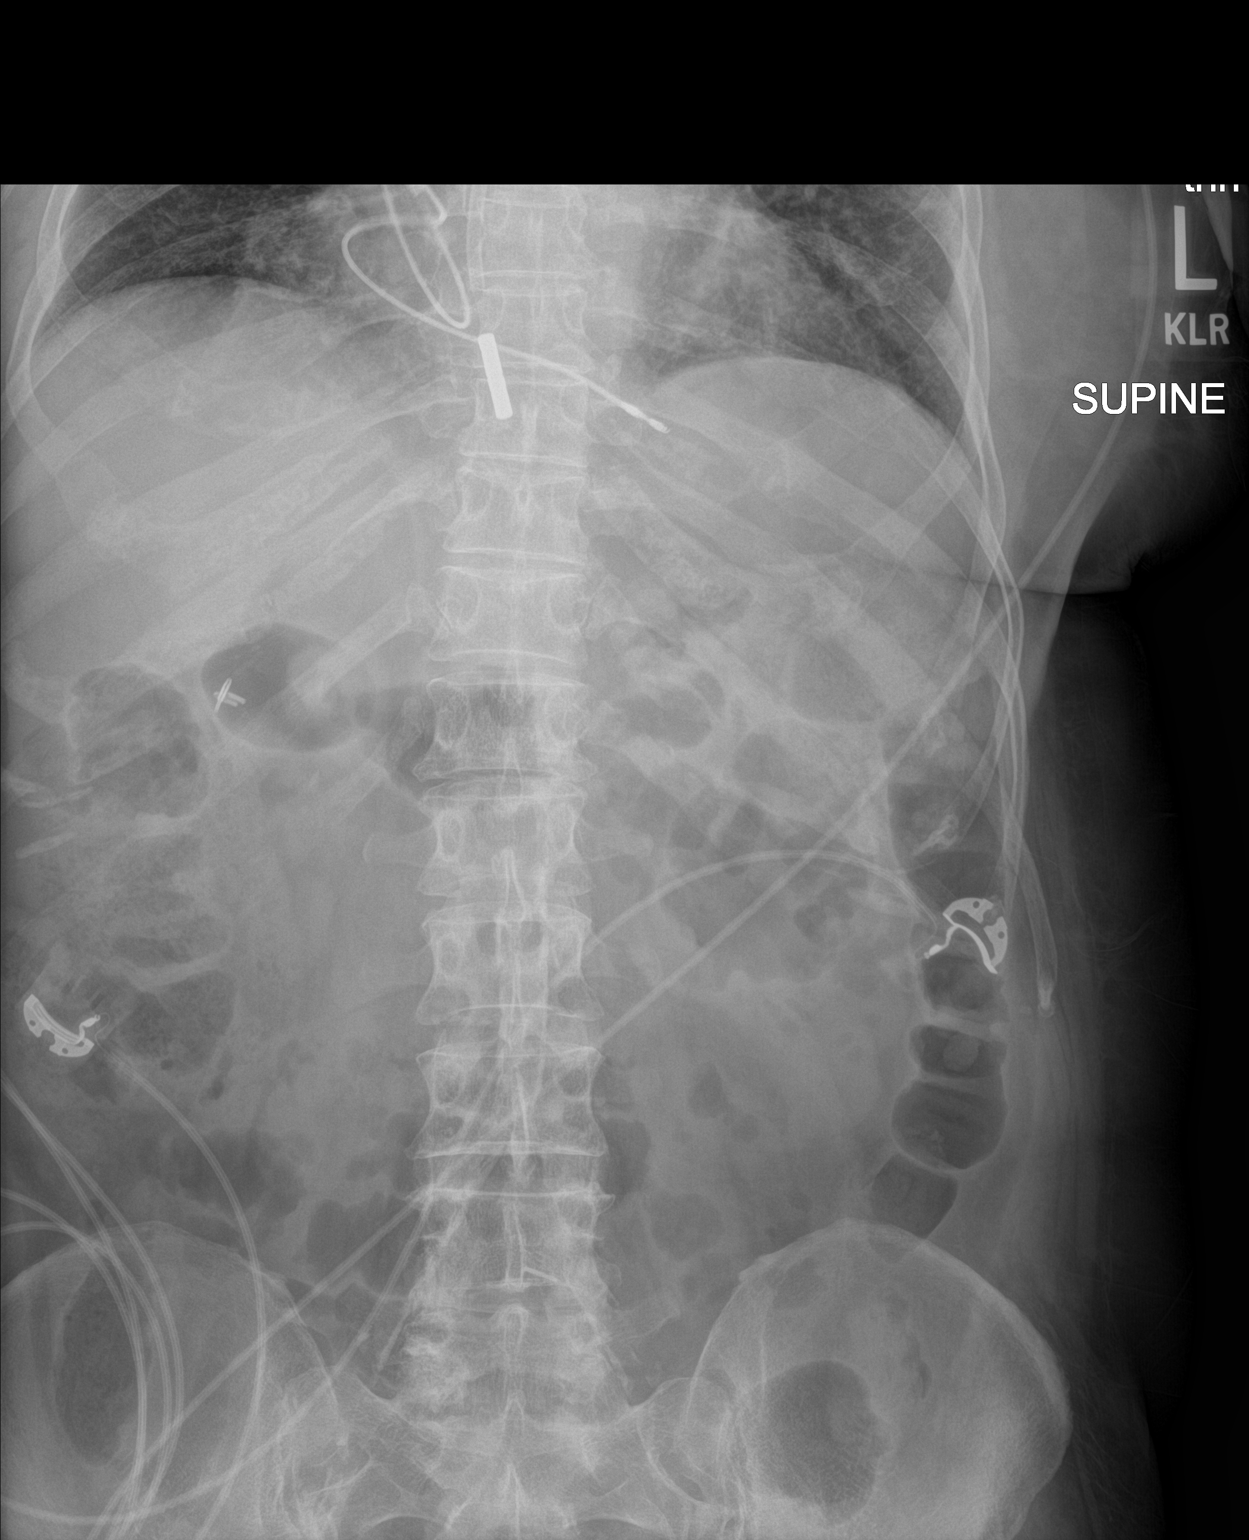

[1 of 1 positions shown; findings below may reference images not displayed]

FINDINGS: Weighted feeding tube tip is in the distal esophagus. Nonobstructive
bowel gas pattern.
IMPRESSION: Weighted feeding tube tip in the distal esophagus. Recommend
advancement.

## 2018-04-26 IMAGING — DX DG ABD PORTABLE 1V
1 series · 1 of 1 positions shown · non-contrast
Comparison: Study obtained earlier in the day

CLINICAL DATA: Feeding tube placement

EXAM:
PORTABLE ABDOMEN - 1 VIEW

[abdomen kub]
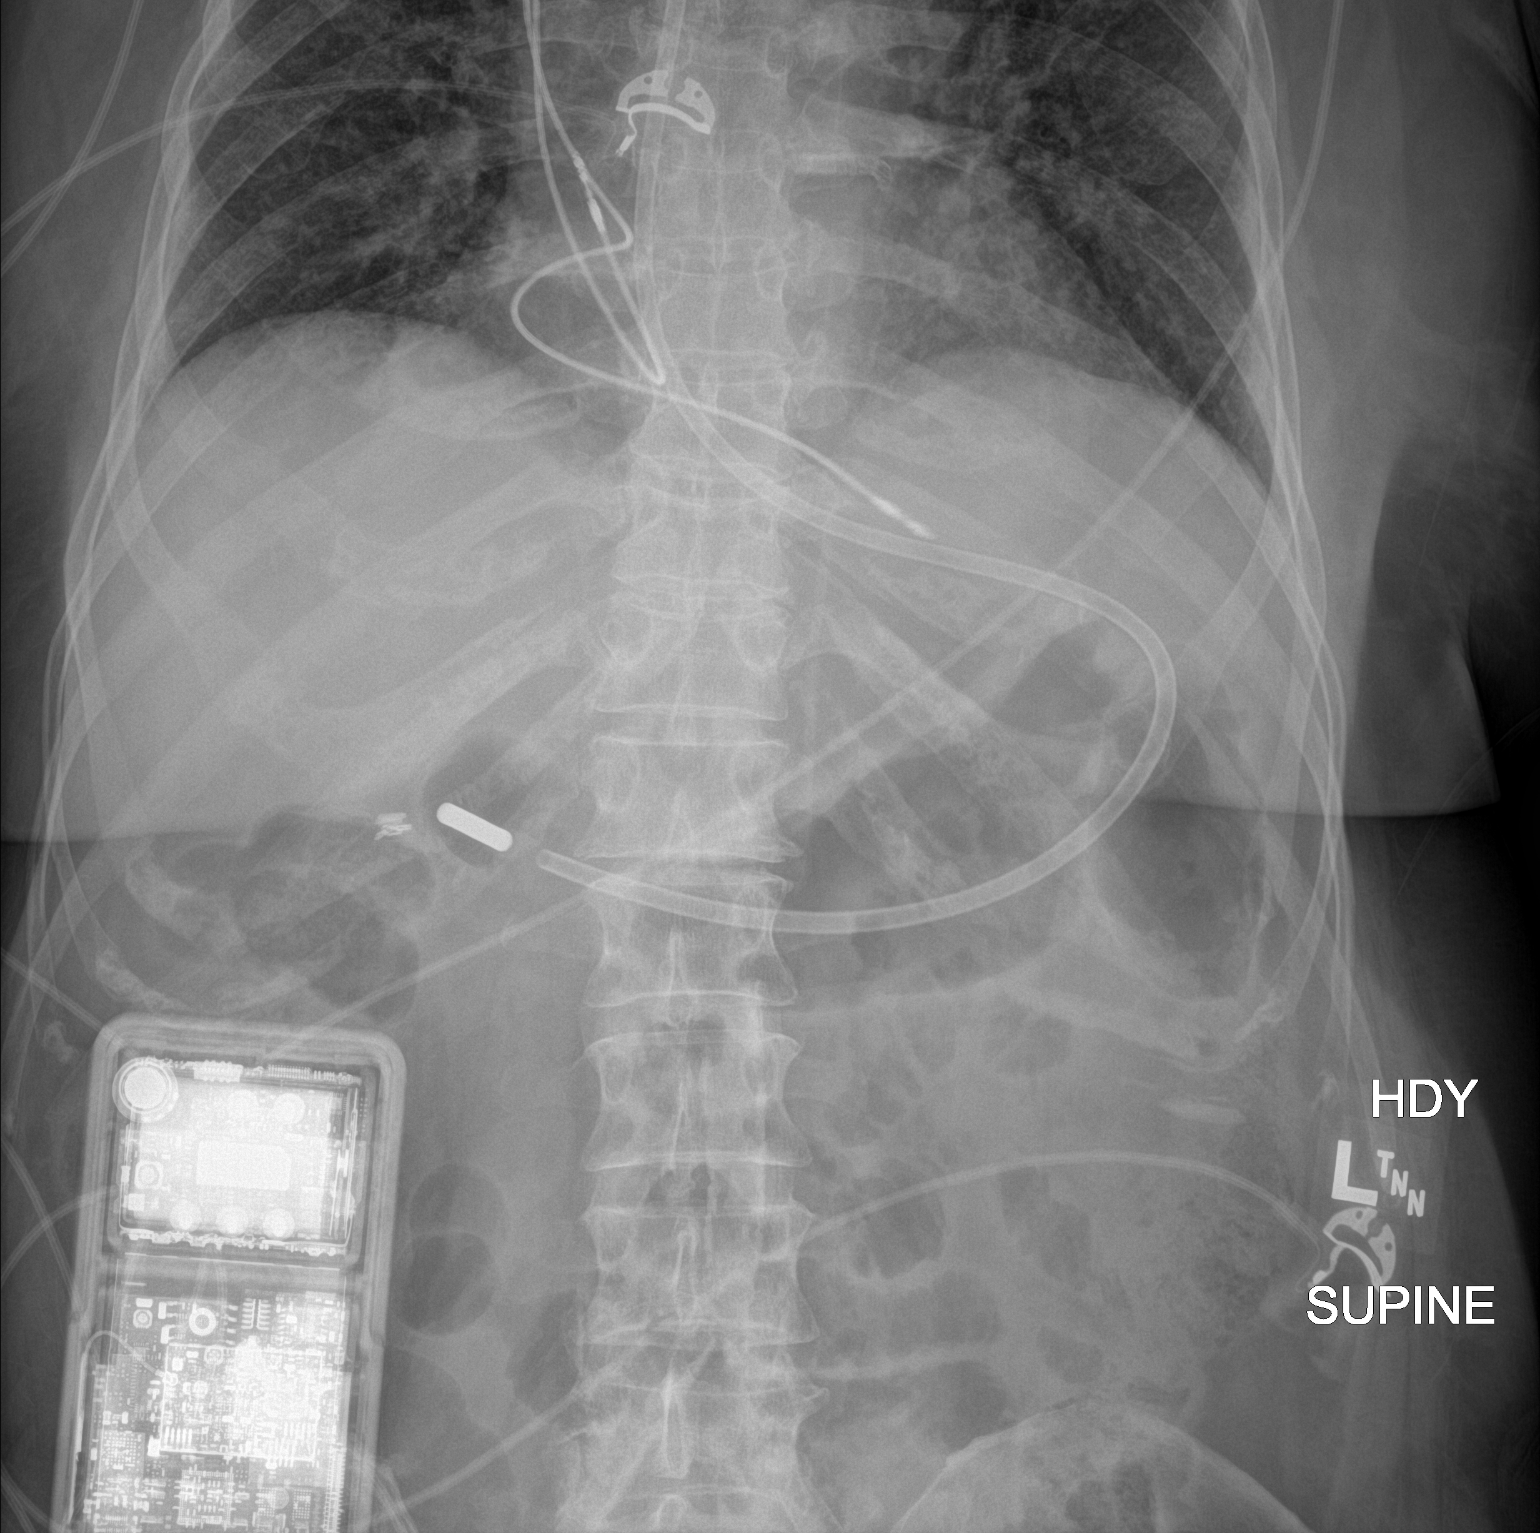

[1 of 1 positions shown; findings below may reference images not displayed]

FINDINGS: Feeding tube tip is at the level of the pylorus. Bowel gas pattern
is normal. Lung bases clear.
IMPRESSION: Feeding tube tip at level of pylorus. Bowel gas pattern
unremarkable.

## 2018-06-07 IMAGING — CR DG HIP (WITH OR WITHOUT PELVIS) 2-3V*R*
1 series · 3 of 3 positions shown · non-contrast
Comparison: 10/13/2014

CLINICAL DATA: Chronic right hip pain increasing over the last 2
days. No reported injury.

EXAM:
DG HIP (WITH OR WITHOUT PELVIS) 2-3V RIGHT

[Series 1: dg hip unilat w or w/o pelvis 2-3 views  · non-contrast · 0.14mm/px · 3 of 3 slices shown]
[im 1/3]
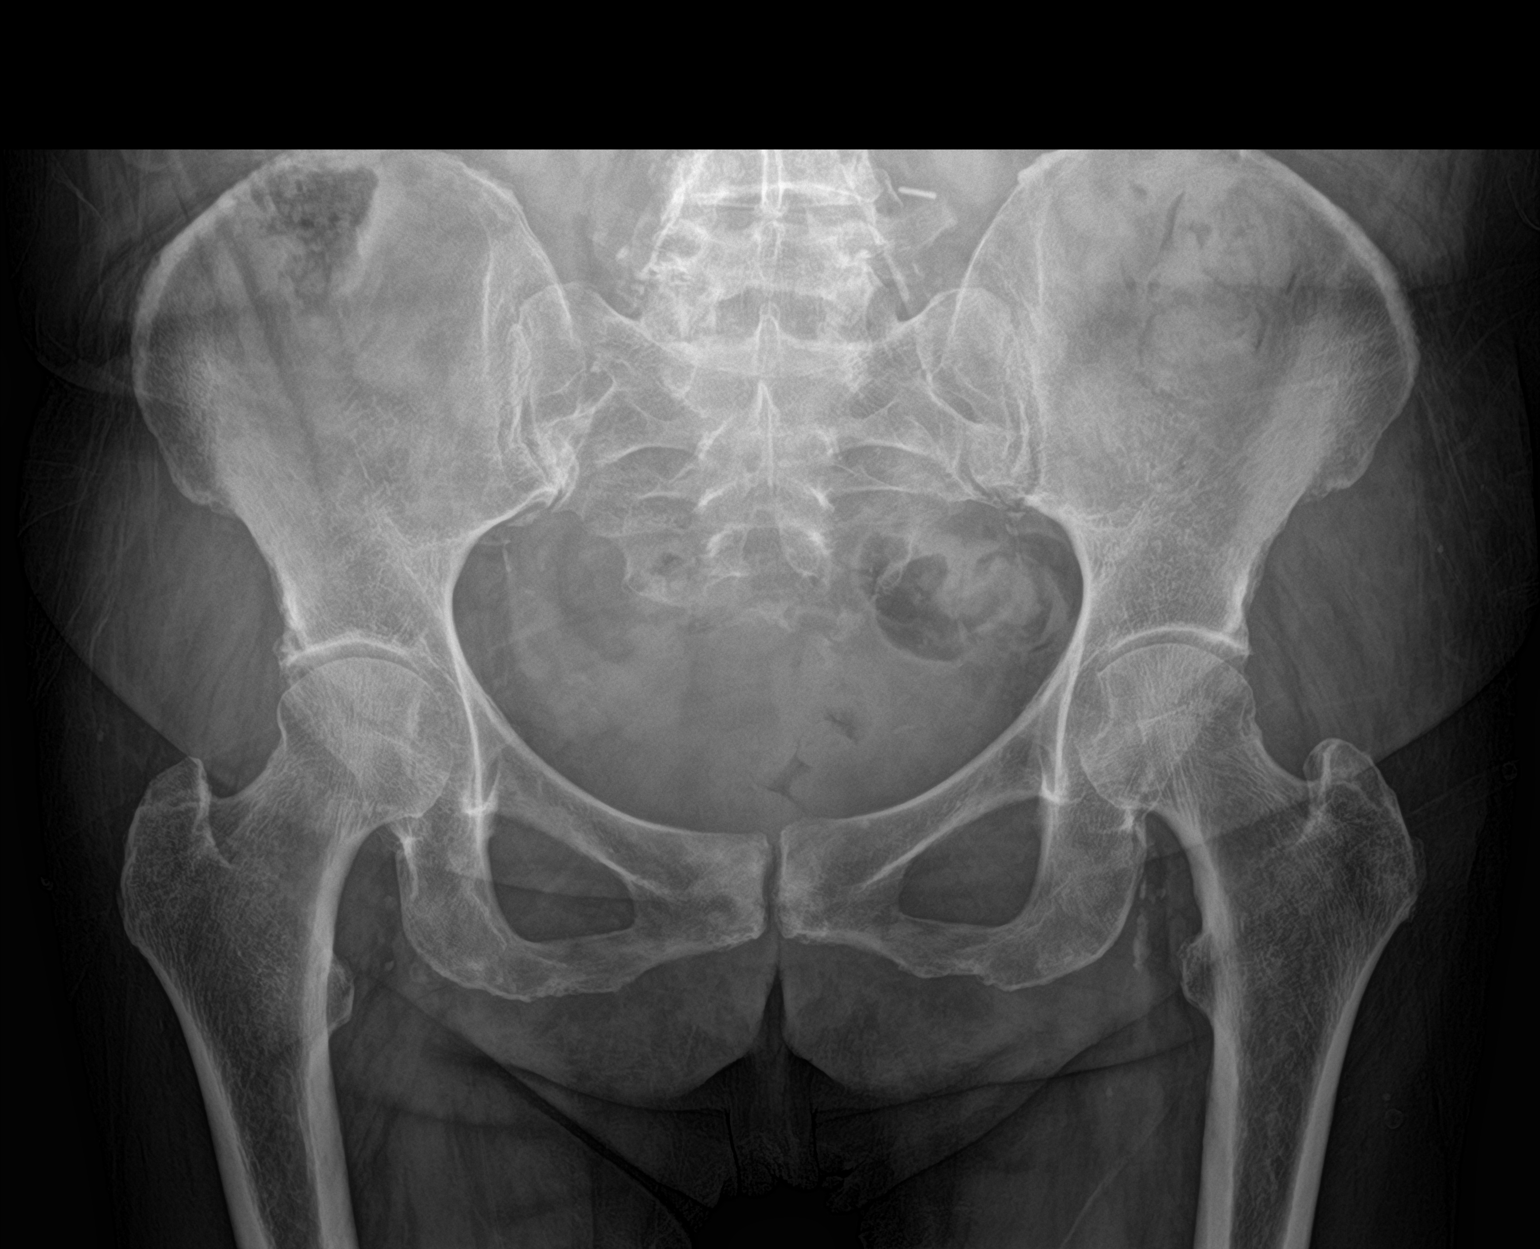
[im 2/3]
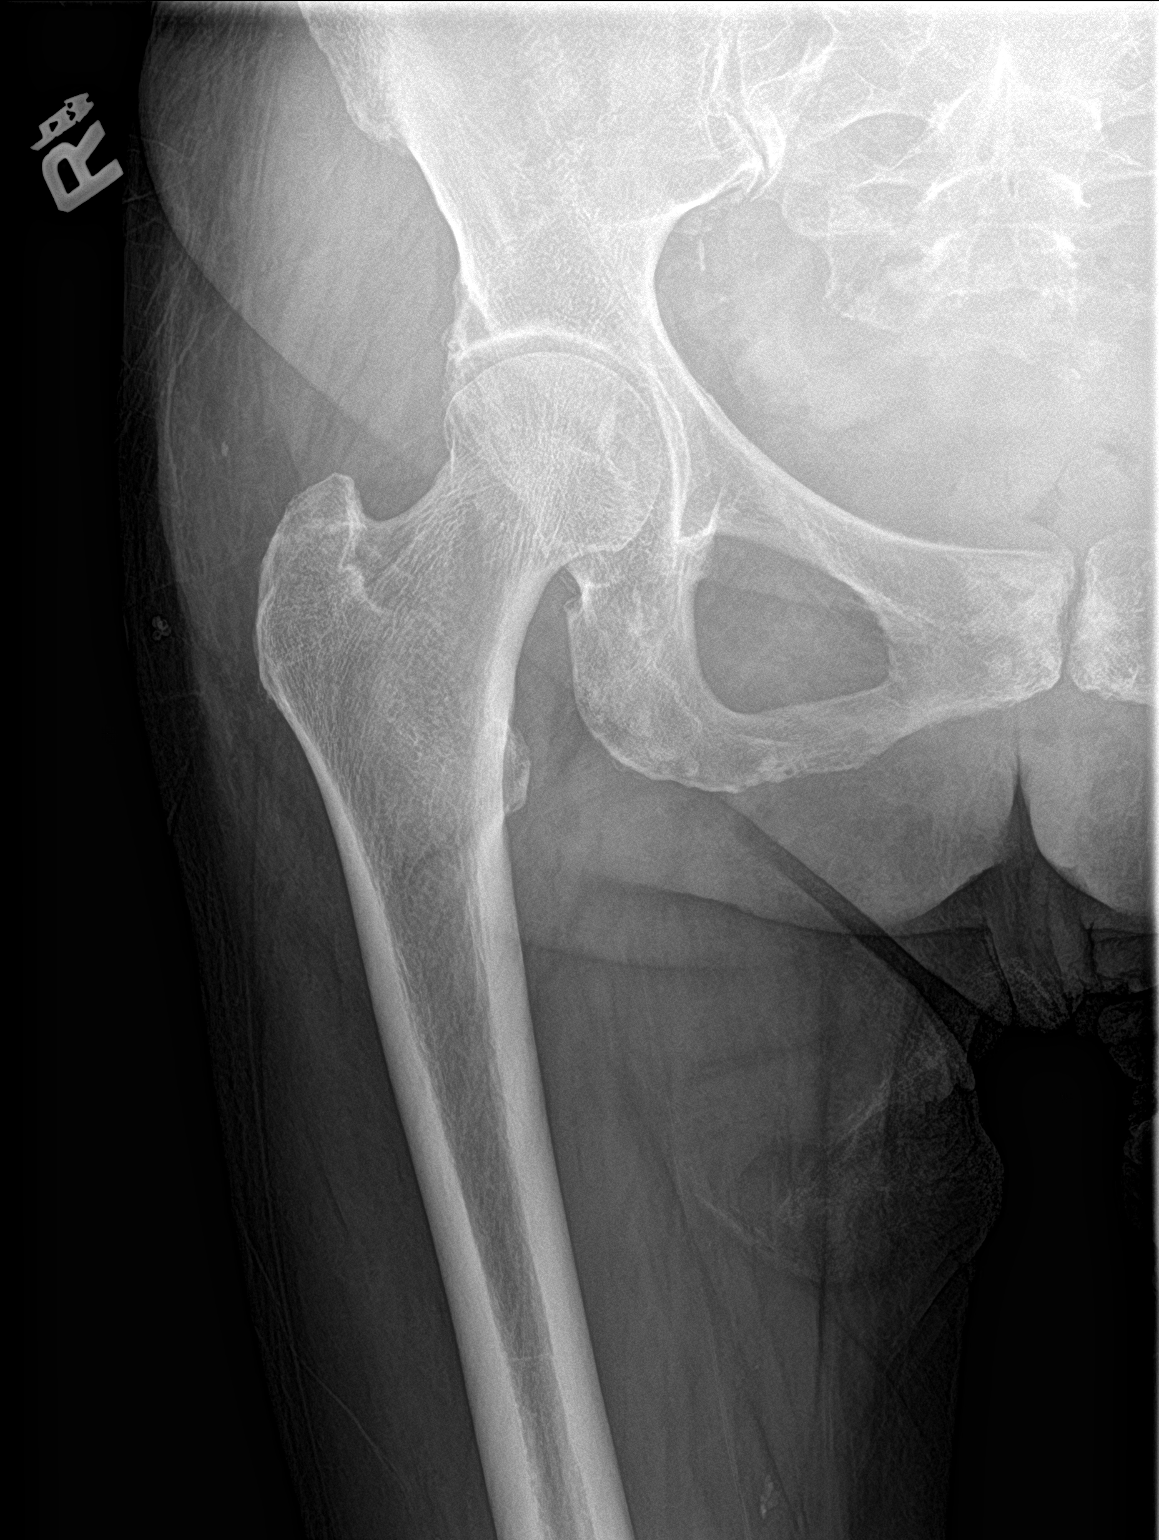
[im 3/3]
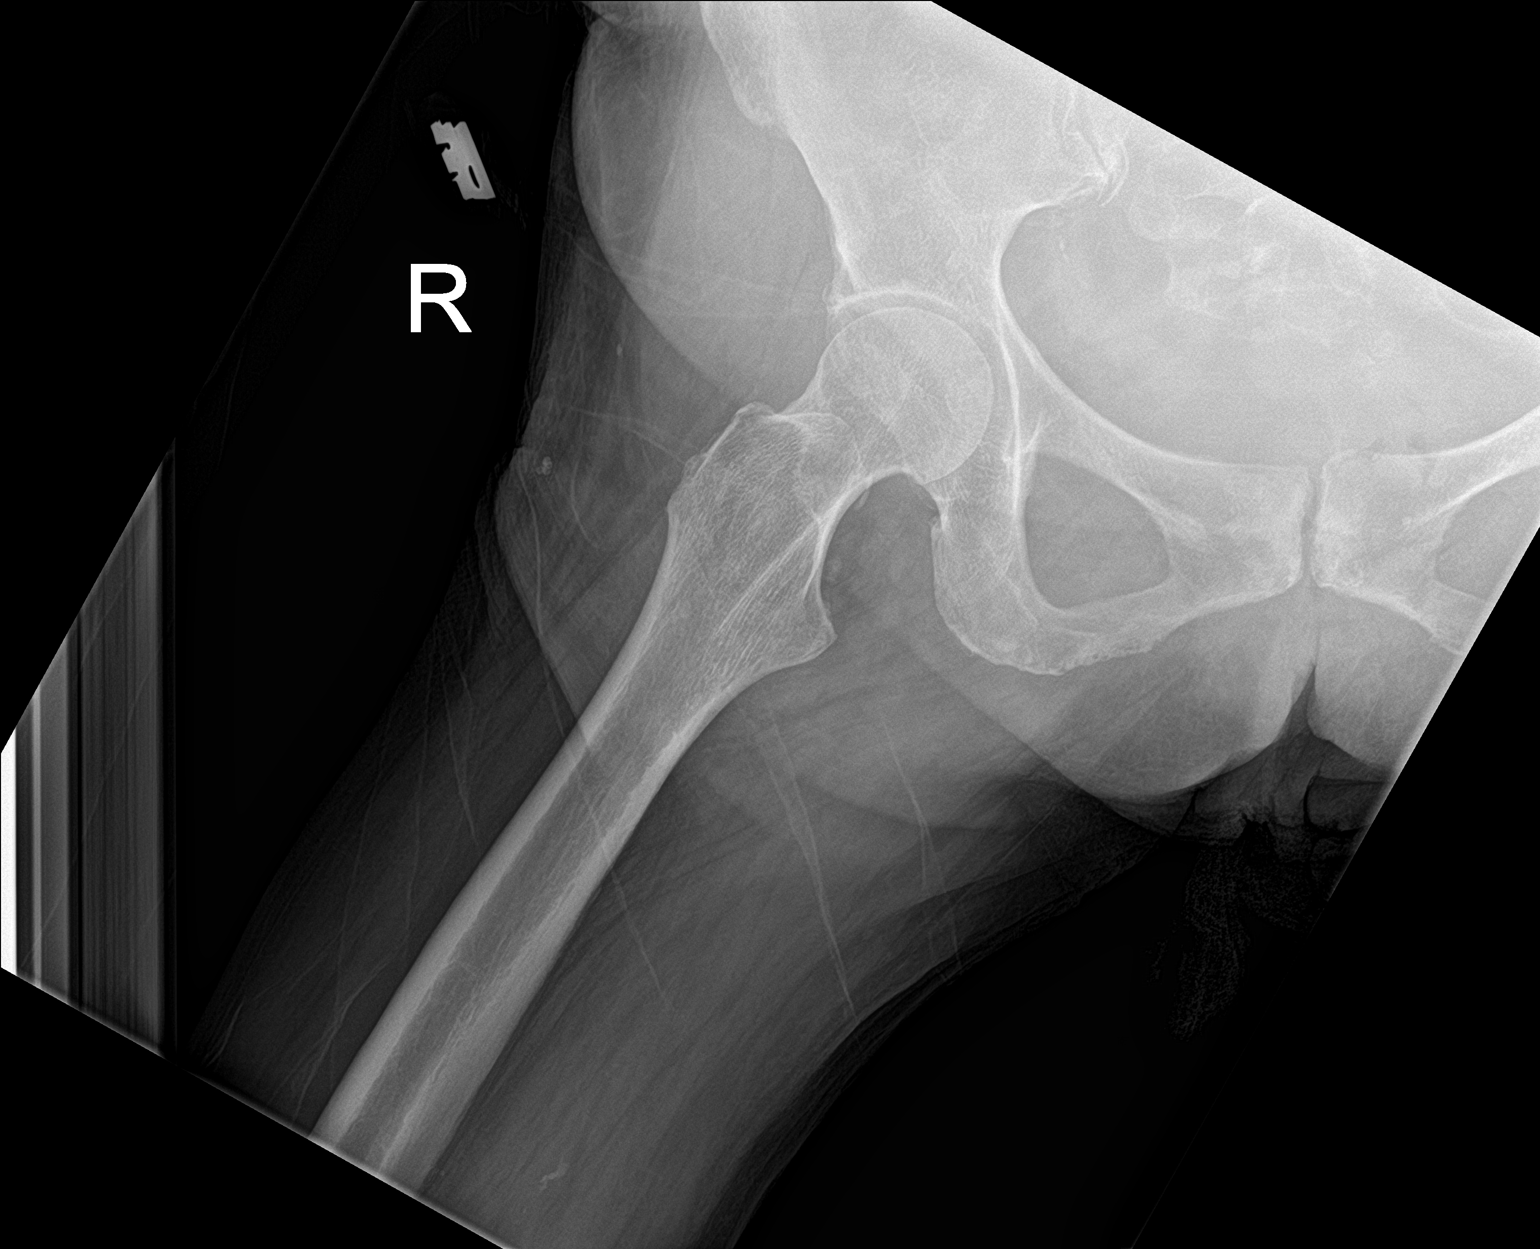

[3 of 3 positions shown; findings below may reference images not displayed]

FINDINGS: Degenerative changes in both hips. No significant progression since
previous study. No evidence of acute fracture or dislocation of the
pelvis or right hip. No focal bone lesion or bone destruction.
Visualized sacrum appears intact. SI joints and symphysis pubis are
not displaced. Vascular calcifications.
IMPRESSION: Degenerative changes in the right hip. No acute displaced fractures
identified.

## 2018-06-07 IMAGING — CT CT HIP*R* W/O CM
2 of 4 series · 16 of 46 positions shown, 19 images · non-contrast
Comparison: Plain films of the right hip 06/29/2017 and 02/08/2014.

CLINICAL DATA: Chronic right hip pain has worsened over the past
2-3 days. Difficulty walking. No known injury.

EXAM:
CT OF THE RIGHT HIP WITHOUT CONTRAST
TECHNIQUE: Multidetector CT imaging of the right hip was performed according to
the standard protocol. Multiplanar CT image reconstructions were
also generated.

[Series 7: axial st------ · axial · 0.35mm/px · z∈[-1206,-1084]mm · 13 of 69 slices shown, 16 images]
[im 5/69  soft-tissue]
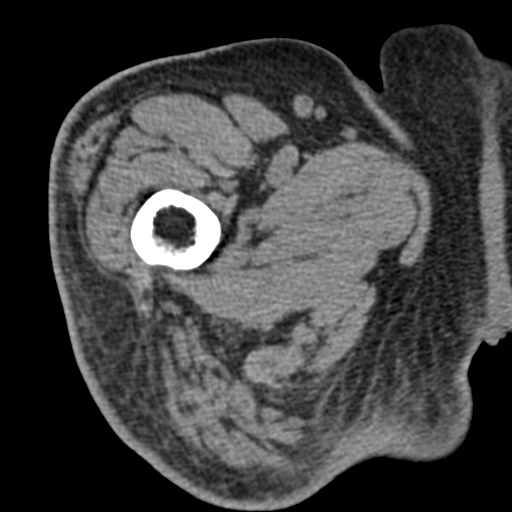
[im 5/69  bone]
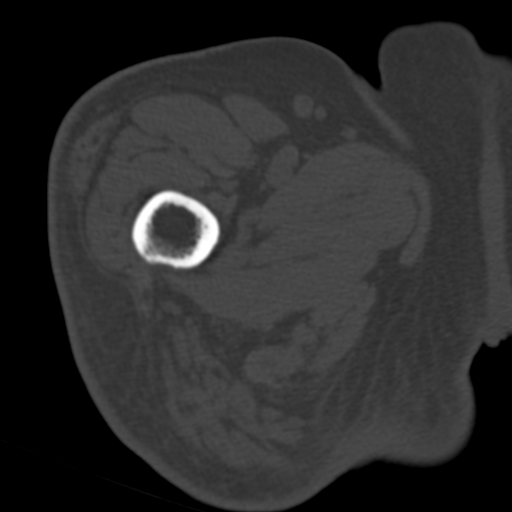
[im 11/69  soft-tissue]
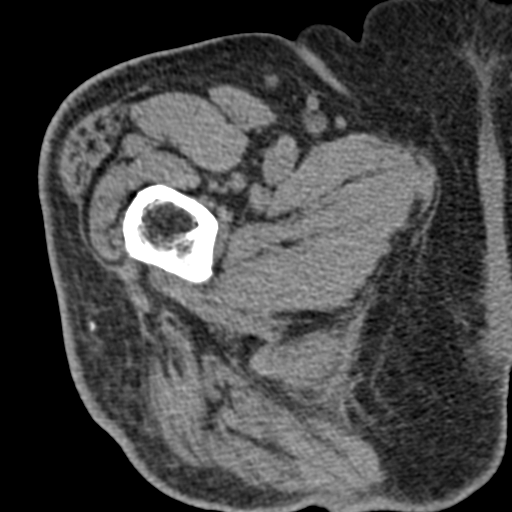
[im 18/69  soft-tissue]
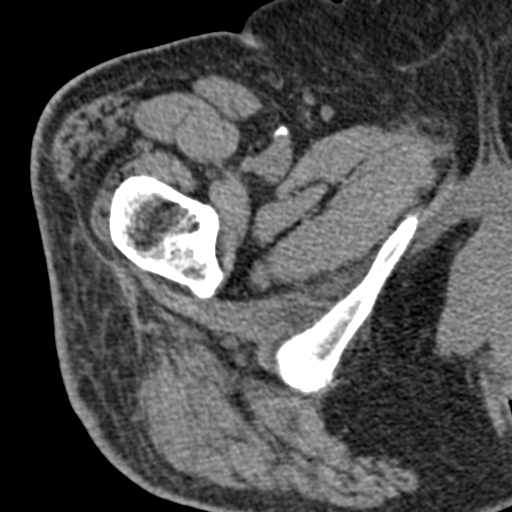
[im 25/69  soft-tissue]
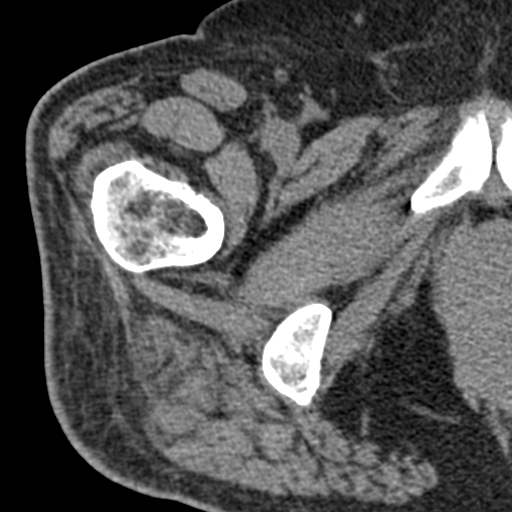
[im 31/69  soft-tissue]
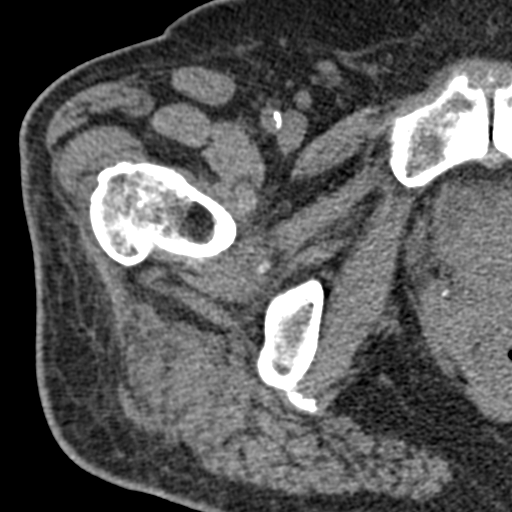
[im 38/69  soft-tissue]
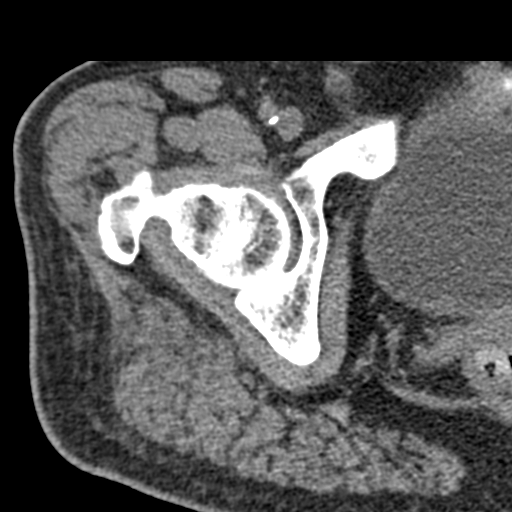
[im 44/69  soft-tissue]
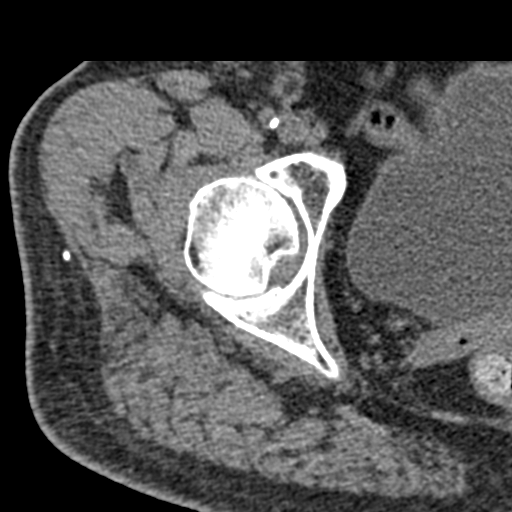
[im 51/69  soft-tissue]
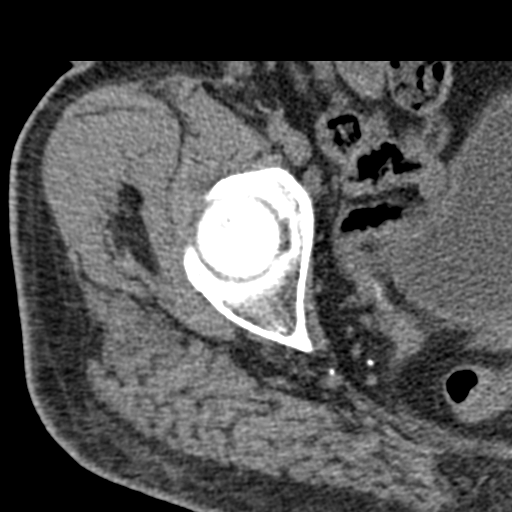
[im 58/69  soft-tissue]
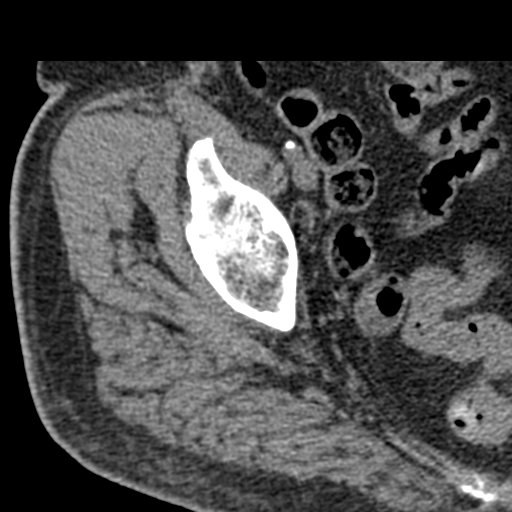
[im 58/69  bone]
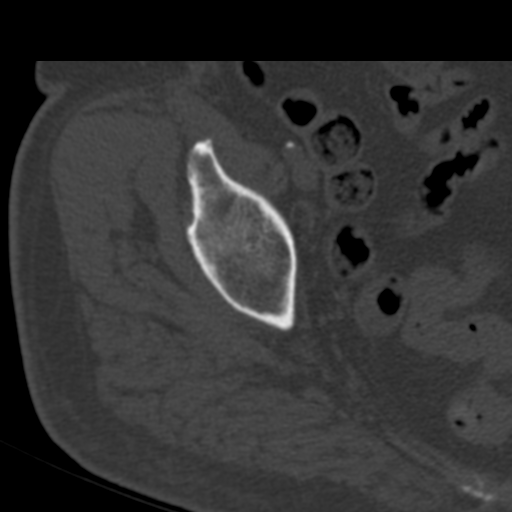
[im 60/69  lung]
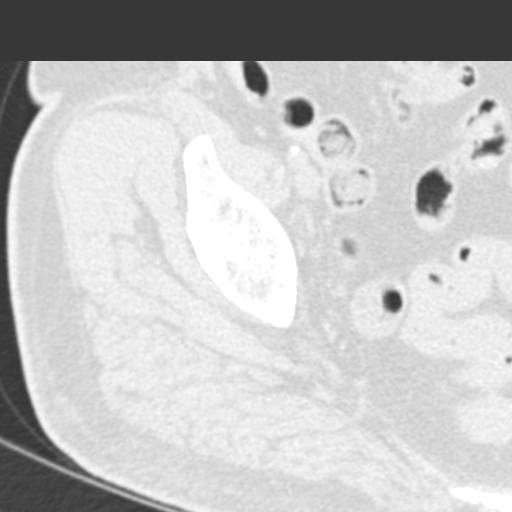
[im 62/69  lung]
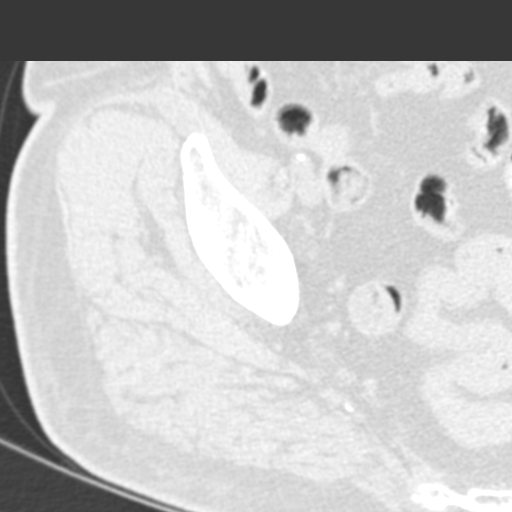
[im 64/69  soft-tissue]
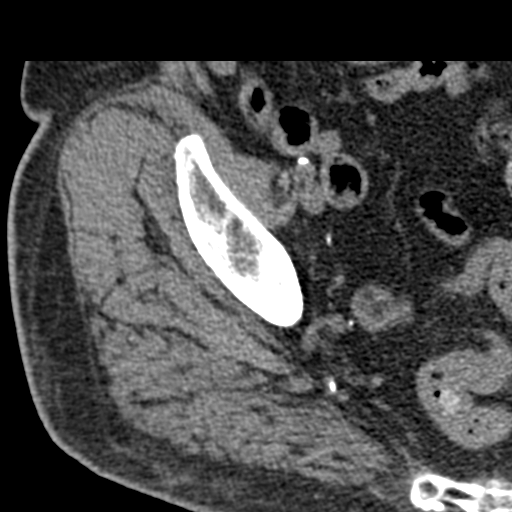
[im 64/69  lung]
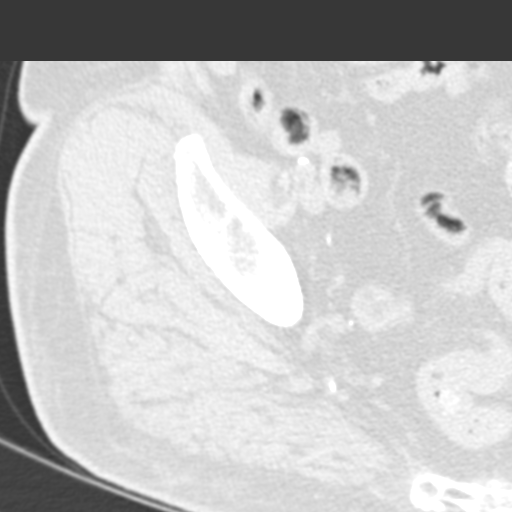
[im 66/69  lung]
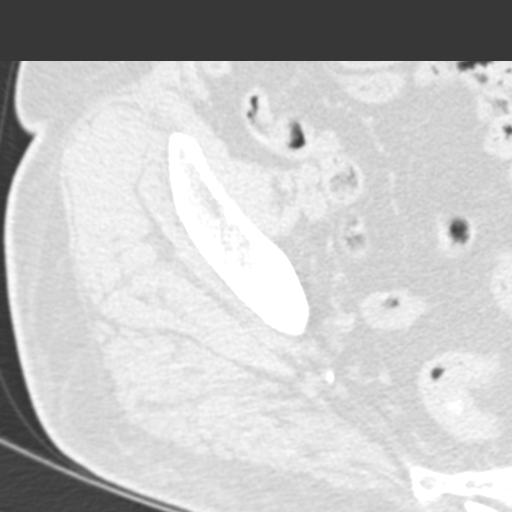

[Series 10: coronal st · coronal · 0.30mm/px · 3 of 66 slices shown]
[im 22/66  soft-tissue]
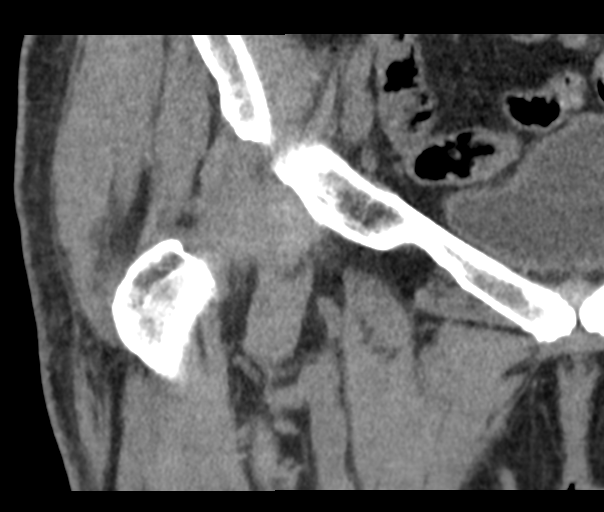
[im 29/66  soft-tissue]
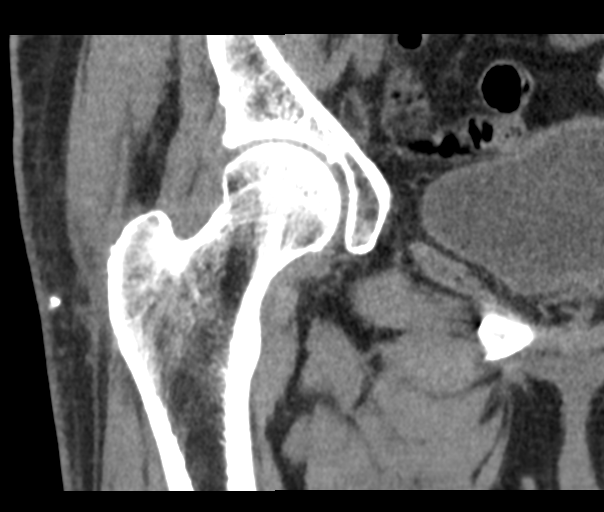
[im 37/66  soft-tissue]
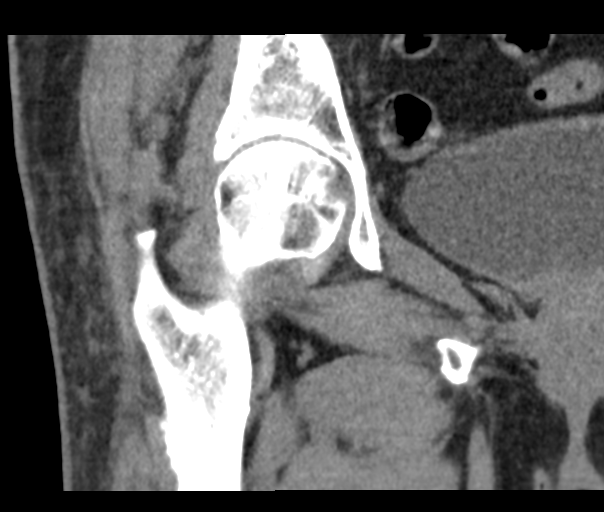

[16 of 46 positions shown; findings below may reference images not displayed]

FINDINGS: Bones/Joint/Cartilage

The hip is located. There is no fracture or focal bone lesion. No
avascular necrosis of the femoral head. Joint space is preserved. No
subchondral sclerosis, subchondral cyst formation or osteophytosis
about the right hip is identified. There is no erosion. Mild to
moderate degenerative change is noted about the symphysis pubis.

Ligaments

Suboptimally assessed by CT.

Muscles and Tendons

Intact.

Soft tissues

Small calcifications subcutaneous fat adjacent to the greater
trochanter may be due to old injury. Mild subcutaneous stranding is
noted. Imaged intrapelvic contents demonstrate atherosclerotic
vascular disease.
IMPRESSION: Normal-appearing right hip.

Mild to moderate degenerative disease about the symphysis pubis.

Atherosclerosis. Mild subcutaneous stranding is likely due to
dependent change

## 2018-11-14 ENCOUNTER — Emergency Department: Payer: Medicare Other

## 2018-11-14 ENCOUNTER — Other Ambulatory Visit: Payer: Self-pay

## 2018-11-14 ENCOUNTER — Encounter: Payer: Self-pay | Admitting: Emergency Medicine

## 2018-11-14 ENCOUNTER — Emergency Department
Admission: EM | Admit: 2018-11-14 | Discharge: 2018-11-14 | Disposition: A | Discharge status: Home / Self Care | Payer: Medicare Other | Attending: Emergency Medicine | Admitting: Emergency Medicine

## 2018-11-14 DIAGNOSIS — Z950 Presence of cardiac pacemaker: Secondary | ICD-10-CM | POA: Diagnosis not present

## 2018-11-14 DIAGNOSIS — M546 Pain in thoracic spine: Secondary | ICD-10-CM | POA: Insufficient documentation

## 2018-11-14 DIAGNOSIS — F17210 Nicotine dependence, cigarettes, uncomplicated: Secondary | ICD-10-CM | POA: Diagnosis not present

## 2018-11-14 DIAGNOSIS — J449 Chronic obstructive pulmonary disease, unspecified: Secondary | ICD-10-CM | POA: Diagnosis not present

## 2018-11-14 DIAGNOSIS — I110 Hypertensive heart disease with heart failure: Secondary | ICD-10-CM | POA: Insufficient documentation

## 2018-11-14 DIAGNOSIS — I2510 Atherosclerotic heart disease of native coronary artery without angina pectoris: Secondary | ICD-10-CM | POA: Diagnosis not present

## 2018-11-14 DIAGNOSIS — R109 Unspecified abdominal pain: Secondary | ICD-10-CM

## 2018-11-14 DIAGNOSIS — I509 Heart failure, unspecified: Secondary | ICD-10-CM | POA: Diagnosis not present

## 2018-11-14 LAB — COMPREHENSIVE METABOLIC PANEL
ALT: 18 U/L (ref 0–44)
AST: 16 U/L (ref 15–41)
Albumin: 4 g/dL (ref 3.5–5.0)
Alkaline Phosphatase: 88 U/L (ref 38–126)
Anion gap: 12 (ref 5–15)
BUN: 22 mg/dL (ref 8–23)
CO2: 25 mmol/L (ref 22–32)
Calcium: 9.5 mg/dL (ref 8.9–10.3)
Chloride: 99 mmol/L (ref 98–111)
Creatinine, Ser: 1.22 mg/dL — ABNORMAL HIGH (ref 0.44–1.00)
GFR calc Af Amer: 51 mL/min — ABNORMAL LOW (ref >60)
GFR calc non Af Amer: 44 mL/min — ABNORMAL LOW (ref >60)
Glucose, Bld: 295 mg/dL — ABNORMAL HIGH (ref 70–99)
Potassium: 4.1 mmol/L (ref 3.5–5.1)
Sodium: 136 mmol/L (ref 135–145)
Total Bilirubin: 0.4 mg/dL (ref 0.3–1.2)
Total Protein: 7.4 g/dL (ref 6.5–8.1)

## 2018-11-14 LAB — CBC
HCT: 35 % — ABNORMAL LOW (ref 36.0–46.0)
Hemoglobin: 11.7 g/dL — ABNORMAL LOW (ref 12.0–15.0)
MCH: 31.3 pg (ref 26.0–34.0)
MCHC: 33.4 g/dL (ref 30.0–36.0)
MCV: 93.6 fL (ref 80.0–100.0)
Platelets: 244 10*3/uL (ref 150–400)
RBC: 3.74 MIL/uL — ABNORMAL LOW (ref 3.87–5.11)
RDW: 14.8 % (ref 11.5–15.5)
WBC: 9.2 10*3/uL (ref 4.0–10.5)
nRBC: 0 % (ref 0.0–0.2)

## 2018-11-14 LAB — URINALYSIS, COMPLETE (UACMP) WITH MICROSCOPIC
Bacteria, UA: NONE SEEN
Bilirubin Urine: NEGATIVE
Glucose, UA: 50 mg/dL — AB
Hgb urine dipstick: NEGATIVE
Ketones, ur: NEGATIVE mg/dL
Leukocytes,Ua: NEGATIVE
Nitrite: NEGATIVE
Protein, ur: 30 mg/dL — AB
Specific Gravity, Urine: 1.011 (ref 1.005–1.030)
pH: 6 (ref 5.0–8.0)

## 2018-11-14 LAB — LIPASE, BLOOD: Lipase: 37 U/L (ref 11–51)

## 2018-11-14 MED ORDER — OXYCODONE-ACETAMINOPHEN 5-325 MG PO TABS
2.0000 | ORAL_TABLET | Freq: Once | ORAL | Status: AC
  Administered 2018-11-14: 2 via ORAL
  Filled 2018-11-14: qty 2

## 2018-11-14 MED ORDER — SODIUM CHLORIDE 0.9% FLUSH: 3.0000 mL | Freq: Once | INTRAVENOUS | Status: DC

## 2018-11-14 MED ORDER — TRAMADOL HCL 50 MG PO TABS: 50.0000 mg | ORAL_TABLET | Freq: Four times a day (QID) | ORAL | 0 refills | Status: DC | PRN

## 2018-11-14 NOTE — ED Notes (Signed)
Pt complains of right sided ribcage pain that radiates to her back. No dyspnea at this time.

## 2018-11-14 NOTE — ED Provider Notes (Signed)
[facility] Emergency Department Provider Note       Time seen: ----------------------------------------- 8:59 AM on 11/14/2018 -----------------------------------------   I have reviewed the triage vital signs and the nursing notes.  HISTORY   Chief Complaint Abdominal Pain    HPI Melanie King is a 73 y.o. female with a history of CHF, coronary artery disease, depression, diabetes, hyperlipidemia, hypertension who presents to the ED for right flank pain.  Patient states it comes around from her back up under her right breast.  She denies any urinary symptoms and does not have a gallbladder.  She arrives alert and oriented.  Past Medical History:  Diagnosis Date  . Arthritis   . CHF (congestive heart failure) (HCC)   . Coronary arteriosclerosis   . DDD (degenerative disc disease), cervical   . DDD (degenerative disc disease), lumbar   . Depression   . Diabetes mellitus without complication (HCC)   . GERD (gastroesophageal reflux disease)   . Hyperlipidemia   . Hypertension   . Osteoarthritis   . Pacemaker   . Tremor     Patient Active Problem List   Diagnosis Date Noted  . Acute delirium 05/12/2017  . Seizure (HCC) 05/10/2017  . Elevated sedimentation rate 04/04/2017  . Elevated C-reactive protein (CRP) 04/04/2017  . Chest pain, unspecified 04/02/2017  . Bilateral lower extremity pain (Secondary Area of Pain) (R>L) 04/02/2017  . Chronic hip pain, bilateral  (Secondary Area of Pain) (R>L) 04/02/2017  . Chronic bilateral low back pain with bilateral sciatica [facility] Area of Pain) (R>L) 04/02/2017  . Chronic pain syndrome 04/02/2017  . Disorder of bone, unspecified 04/02/2017  . Other long term (current) drug therapy 04/02/2017  . Other specified health status 04/02/2017  . Long term current use of opiate analgesic 04/02/2017  . Hip pain, bilateral 12/06/2016  . Tremor 05/27/2016  . Trigger finger of left hand 02/16/2016  .  Osteoarthritis of both hands 02/16/2016  . Angina pectoris (HCC) 01/17/2016  . COPD (chronic obstructive pulmonary disease) (HCC) 01/17/2016  . CAD (coronary artery disease) 01/17/2016  . HLD (hyperlipidemia) 01/17/2016  . Hypertension 01/17/2016  . Sick sinus syndrome (HCC) 01/17/2016  . Chronic pain 01/17/2016  . Personal history of tobacco use, presenting hazards to health 11/16/2015  . Diabetes (HCC) 05/12/2013    Past Surgical History:  Procedure Laterality Date  . ABDOMINAL HYSTERECTOMY    . BREAST BIOPSY Right 1994   neg cyst removed  . CHOLECYSTECTOMY    . EYE SURGERY  1964, 1966, and 1967   bilateral  . FOOT SURGERY Right    cellulitis  . PACEMAKER INSERTION    . SPINE SURGERY     cyst removed; [facility]    Allergies Alprazolam, Demerol [meperidine], and Fentanyl  Social History Social History   Tobacco Use  . Smoking status: Current Every Day Smoker    Packs/day: 1.00    Years: 50.00    Pack years: 50.00    Types: Cigarettes  . Smokeless tobacco: Never Used  Substance Use Topics  . Alcohol use: No  . Drug use: No    Review of Systems Constitutional: Negative for fever. Cardiovascular: Negative for chest pain. Respiratory: Negative for shortness of breath. Gastrointestinal: Negative for abdominal pain Musculoskeletal: Positive for right-sided rib pain Skin: Negative for rash. Neurological: Negative for headaches, focal weakness or numbness.  All systems negative/normal/unremarkable except as stated in the HPI  ____________________________________________   PHYSICAL EXAM:  VITAL SIGNS: ED Triage Vitals [11/14/18 0809]  Enc Vitals Group     BP (!) 182/66     Pulse Rate 70     Resp 18     Temp 98 F (36.7 C)     Temp Source Oral     SpO2 98 %     Weight 150 lb (68 kg)     Height 5\' 1"  (1.549 m)     Head Circumference      Peak Flow      Pain Score 7     Pain Loc      Pain Edu?      Excl. in GC?    Constitutional: Alert and  oriented. Well appearing and in no distress. Eyes: Conjunctivae are normal. Normal extraocular movements. Cardiovascular: Normal rate, regular rhythm. No murmurs, rubs, or gallops. Respiratory: Normal respiratory effort without tachypnea nor retractions. Breath sounds are clear and equal bilaterally. No wheezes/rales/rhonchi. Gastrointestinal: Soft and nontender. Normal bowel sounds Musculoskeletal: Reproducible rib tenderness on the right flank radiates it is reproducible beneath the right breast Neurologic:  Normal speech and language. No gross focal neurologic deficits are appreciated.  Skin:  Skin is warm, dry and intact. No rash noted. Psychiatric: Mood and affect are normal. Speech and behavior are normal.  ____________________________________________  EKG: Interpreted by me.  Atrial paced rhythm with a rate of 70 bpm, normal pacemaker function is noted  ____________________________________________  ED COURSE:  As part of my medical decision making, I reviewed the following data within the electronic MEDICAL RECORD NUMBER History obtained from family if available, nursing notes, old chart and ekg, as well as notes from prior ED visits. Patient presented for right flank pain, we will assess with labs and imaging as indicated at this time.   Procedures  Melanie King was evaluated in Emergency Department on 11/14/2018 for the symptoms described in the history of present illness. She was evaluated in the context of the global COVID-19 pandemic, which necessitated consideration that the patient might be at risk for infection with the SARS-CoV-2 virus that causes COVID-19. Institutional protocols and algorithms that pertain to the evaluation of patients at risk for COVID-19 are in a state of rapid change based on information released by regulatory bodies including the CDC and federal and state organizations. These policies and algorithms were followed during the patient's care in the ED.   ____________________________________________   LABS (pertinent positives/negatives)  Labs Reviewed  COMPREHENSIVE METABOLIC PANEL - Abnormal; Notable for the following components:      Result Value   Glucose, Bld 295 (*)    Creatinine, Ser 1.22 (*)    GFR calc non Af Amer 44 (*)    GFR calc Af Amer 51 (*)    All other components within normal limits  CBC - Abnormal; Notable for the following components:   RBC 3.74 (*)    Hemoglobin 11.7 (*)    HCT 35.0 (*)    All other components within normal limits  URINALYSIS, COMPLETE (UACMP) WITH MICROSCOPIC - Abnormal; Notable for the following components:   Color, Urine YELLOW (*)    APPearance CLEAR (*)    Glucose, UA 50 (*)    Protein, ur 30 (*)    All other components within normal limits  LIPASE, BLOOD    RADIOLOGY Images were viewed by me  Chest x-ray with right rib views IMPRESSION: No acute findings. ____________________________________________   DIFFERENTIAL DIAGNOSIS   Musculoskeletal pain, spasm, renal colic, UTI, pyelonephritis, shingles  FINAL ASSESSMENT AND PLAN  Chest wall  pain   Plan: The patient had presented for right flank pain. Patient's labs were grossly unremarkable. Patient's imaging did not reveal any acute process.  Pain seems to be musculoskeletal in origin.  We have advised her if she has a rash to seek treatment for shingles.  Otherwise she will be discharged with pain medicine.   Laurence Aly, MD    Note: This note was generated in part or whole with voice recognition software. Voice recognition is usually quite accurate but there are transcription errors that can and very often do occur. I apologize for any typographical errors that were not detected and corrected.     Earleen Newport, MD 11/14/18 1058

## 2018-11-14 NOTE — ED Triage Notes (Signed)
Pt via pov from home with right sided abdominal/flank pain. Pt states it comes around from her back up "into my right breast." Denies urinary symptoms. Pt does not have gallbladder. Pt alert & oriented with NAD noted.

## 2018-11-14 NOTE — ED Notes (Signed)

## 2018-12-11 ENCOUNTER — Other Ambulatory Visit: Payer: Self-pay | Admitting: Adult Health

## 2018-12-11 DIAGNOSIS — Z1231 Encounter for screening mammogram for malignant neoplasm of breast: Secondary | ICD-10-CM

## 2018-12-21 IMAGING — US US EXTREM  UP VENOUS*R*
1 series · 13 of 24 positions shown · non-contrast
Comparison: None.

CLINICAL DATA: Right upper extremity pain and edema. History of
left-sided pacemaker placement. History of diabetes and smoking.
Evaluate for DVT.



[Series 1: us extrem up venous*right* · 0.07mm/px · 13 of 34 slices shown]
[im 1/34]
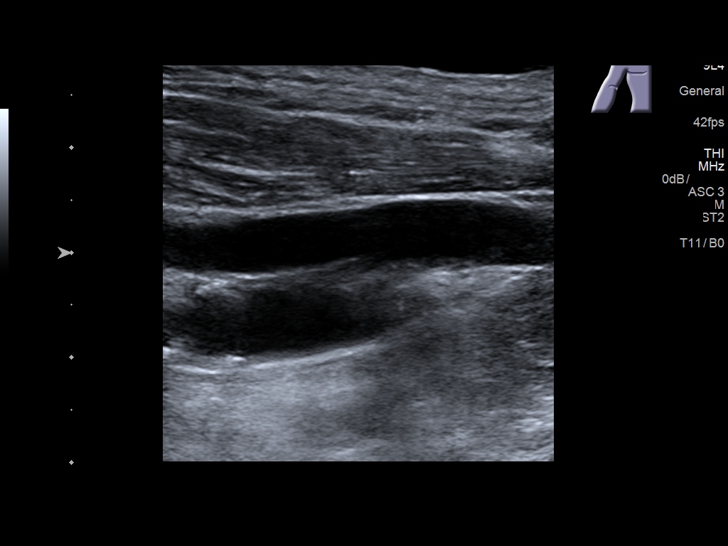
[im 3/34]
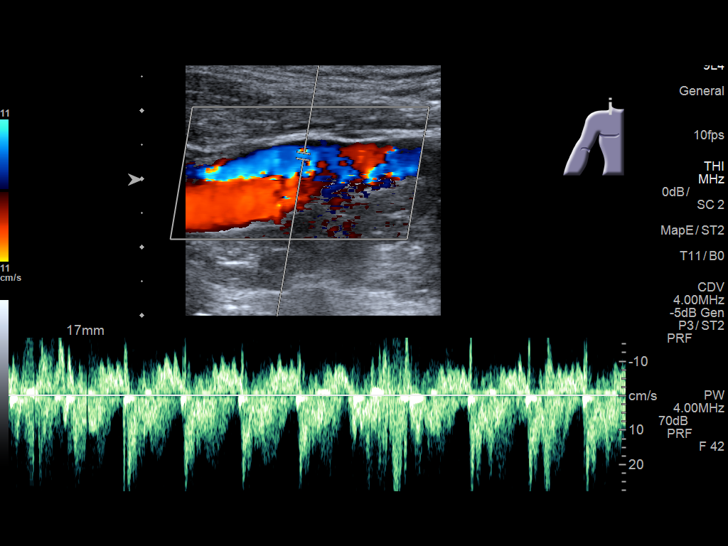
[im 6/34]
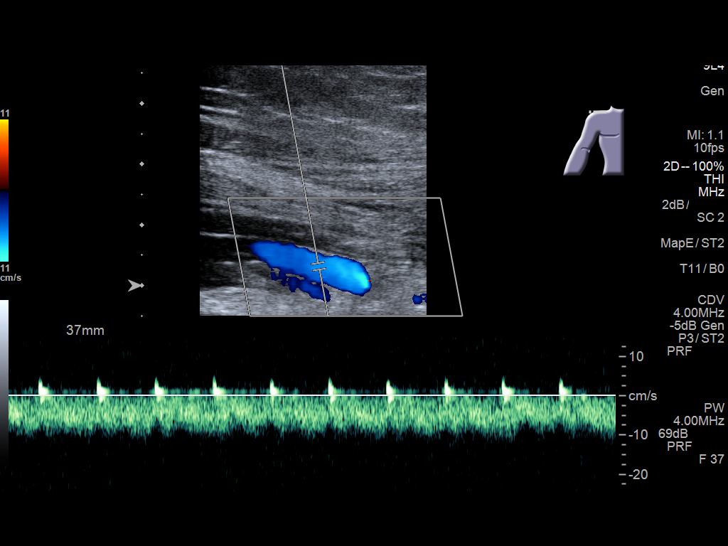
[im 9/34]
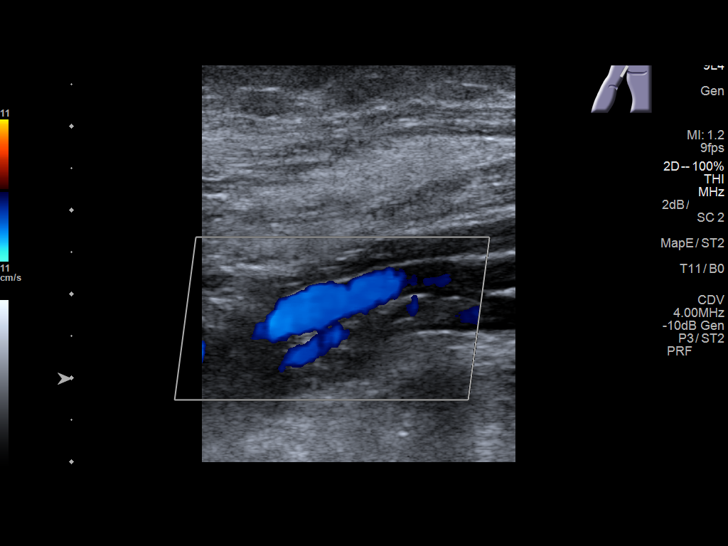
[im 12/34]
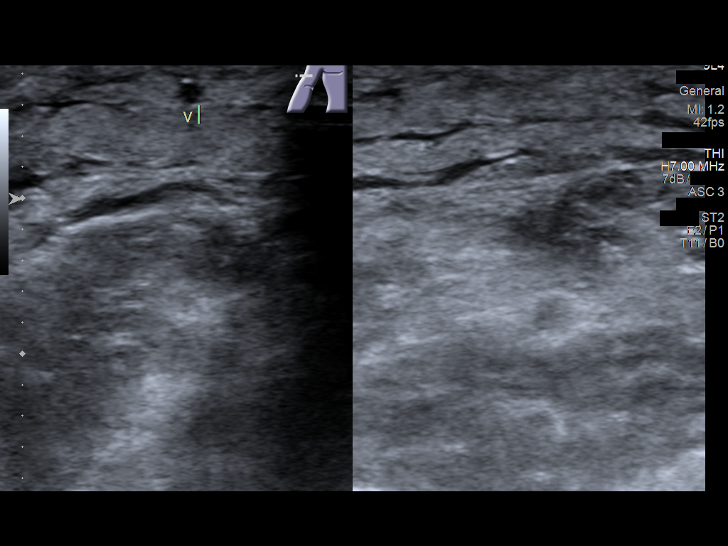
[im 15/34]
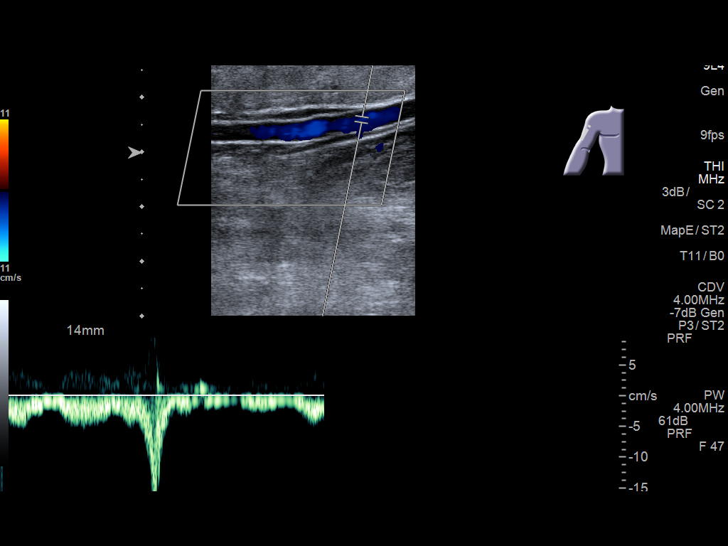
[im 18/34]
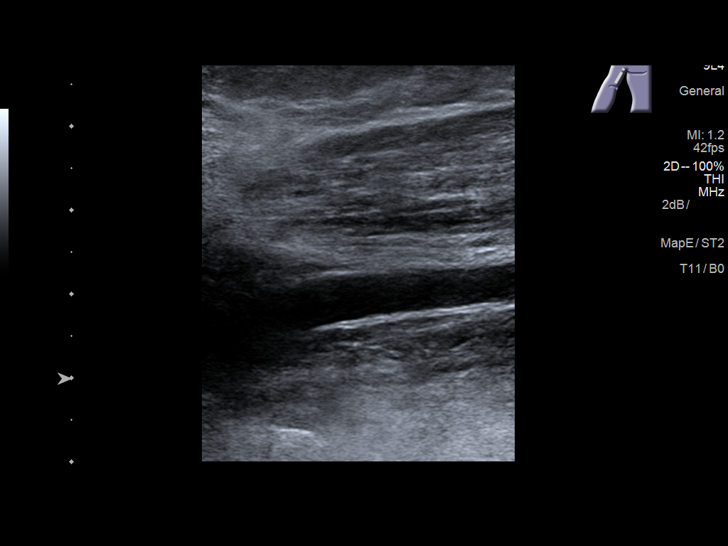
[im 19/34]
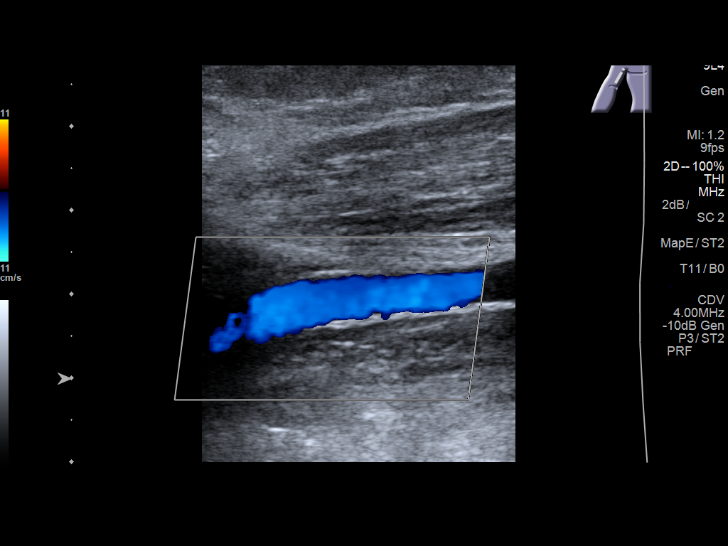
[im 22/34]
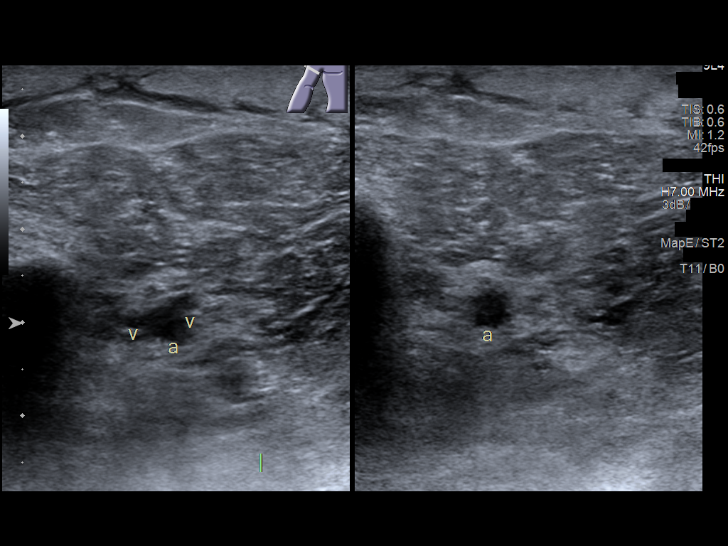
[im 25/34]
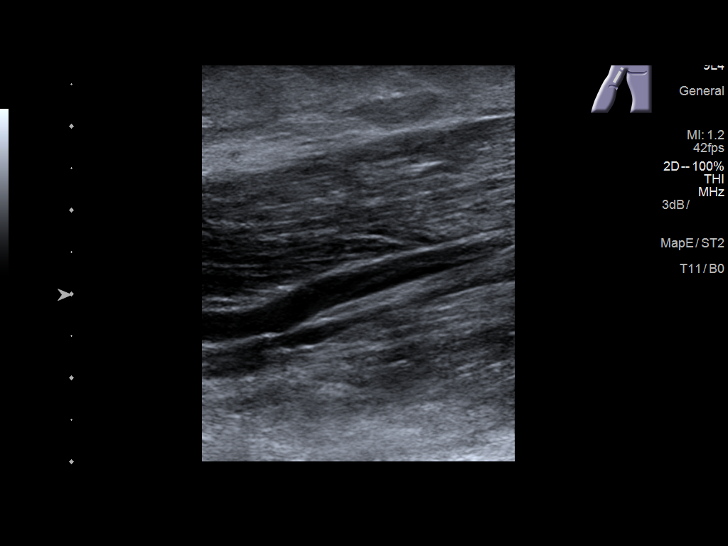
[im 28/34]
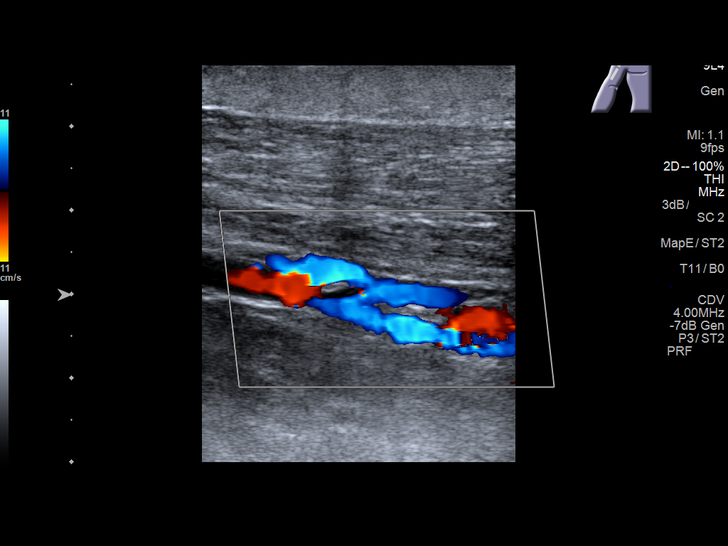
[im 31/34]
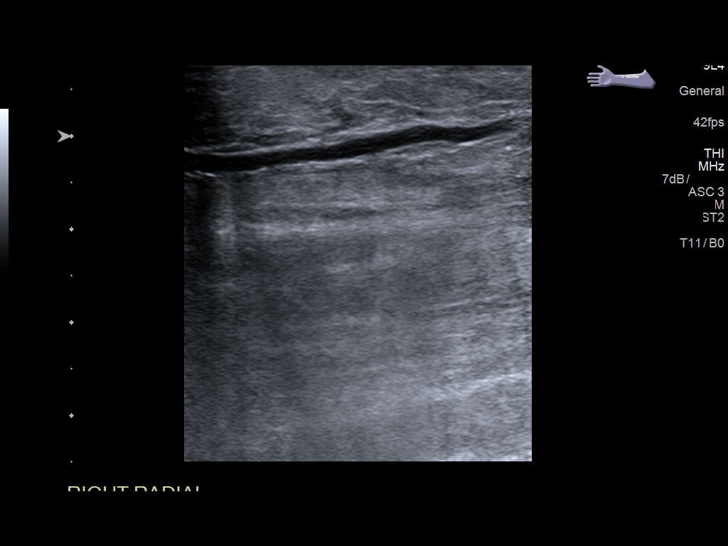
[im 34/34]
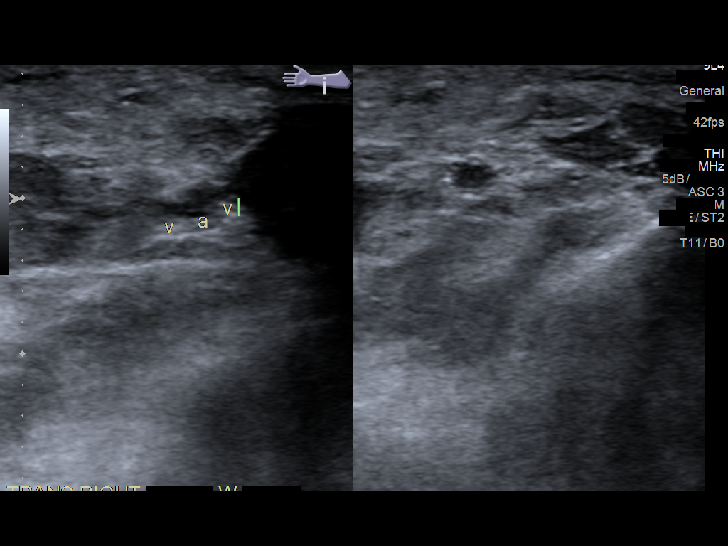

[13 of 24 positions shown; findings below may reference images not displayed]

FINDINGS: Contralateral Subclavian Vein: Respiratory phasicity is normal and
symmetric with the symptomatic side. No evidence of thrombus. Normal
compressibility.

Internal Jugular Vein: No evidence of thrombus. Normal
compressibility, respiratory phasicity and response to augmentation.

Subclavian Vein: No evidence of thrombus. Normal compressibility,
respiratory phasicity and response to augmentation.

Axillary Vein: No evidence of thrombus. Normal compressibility,
respiratory phasicity and response to augmentation.

Cephalic Vein: No evidence of thrombus. Normal compressibility,
respiratory phasicity and response to augmentation.

Basilic Vein: No evidence of thrombus. Normal compressibility,
respiratory phasicity and response to augmentation.

Brachial Veins: No evidence of thrombus. Normal compressibility,
respiratory phasicity and response to augmentation.

Radial Veins: No evidence of thrombus. Normal compressibility,
respiratory phasicity and response to augmentation.

Ulnar Veins: No evidence of thrombus. Normal compressibility,
respiratory phasicity and response to augmentation.

Venous Reflux:  None visualized.

Other Findings:  None visualized.
IMPRESSION: No evidence of DVT within the right upper extremity.

## 2019-01-12 ENCOUNTER — Emergency Department: Payer: Medicare Other

## 2019-01-12 ENCOUNTER — Emergency Department
Admission: EM | Admit: 2019-01-12 | Discharge: 2019-01-12 | Disposition: A | Discharge status: Home / Self Care | Payer: Medicare Other | Attending: Emergency Medicine | Admitting: Emergency Medicine

## 2019-01-12 ENCOUNTER — Encounter: Payer: Self-pay | Admitting: Emergency Medicine

## 2019-01-12 ENCOUNTER — Other Ambulatory Visit: Payer: Self-pay

## 2019-01-12 DIAGNOSIS — R569 Unspecified convulsions: Secondary | ICD-10-CM | POA: Insufficient documentation

## 2019-01-12 DIAGNOSIS — J449 Chronic obstructive pulmonary disease, unspecified: Secondary | ICD-10-CM | POA: Diagnosis not present

## 2019-01-12 DIAGNOSIS — I110 Hypertensive heart disease with heart failure: Secondary | ICD-10-CM | POA: Insufficient documentation

## 2019-01-12 DIAGNOSIS — R52 Pain, unspecified: Secondary | ICD-10-CM | POA: Diagnosis present

## 2019-01-12 DIAGNOSIS — I509 Heart failure, unspecified: Secondary | ICD-10-CM | POA: Diagnosis not present

## 2019-01-12 DIAGNOSIS — E860 Dehydration: Secondary | ICD-10-CM | POA: Diagnosis not present

## 2019-01-12 DIAGNOSIS — F17210 Nicotine dependence, cigarettes, uncomplicated: Secondary | ICD-10-CM | POA: Insufficient documentation

## 2019-01-12 DIAGNOSIS — E119 Type 2 diabetes mellitus without complications: Secondary | ICD-10-CM | POA: Insufficient documentation

## 2019-01-12 DIAGNOSIS — Z79899 Other long term (current) drug therapy: Secondary | ICD-10-CM | POA: Diagnosis not present

## 2019-01-12 DIAGNOSIS — Z950 Presence of cardiac pacemaker: Secondary | ICD-10-CM | POA: Insufficient documentation

## 2019-01-12 DIAGNOSIS — Z794 Long term (current) use of insulin: Secondary | ICD-10-CM | POA: Insufficient documentation

## 2019-01-12 DIAGNOSIS — I2510 Atherosclerotic heart disease of native coronary artery without angina pectoris: Secondary | ICD-10-CM | POA: Insufficient documentation

## 2019-01-12 LAB — URINALYSIS, COMPLETE (UACMP) WITH MICROSCOPIC
Bacteria, UA: NONE SEEN
Bilirubin Urine: NEGATIVE
Glucose, UA: NEGATIVE mg/dL
Hgb urine dipstick: NEGATIVE
Ketones, ur: NEGATIVE mg/dL
Leukocytes,Ua: NEGATIVE
Nitrite: NEGATIVE
Protein, ur: 30 mg/dL — AB
Specific Gravity, Urine: 1.013 (ref 1.005–1.030)
pH: 6 (ref 5.0–8.0)

## 2019-01-12 LAB — BASIC METABOLIC PANEL
Anion gap: 11 (ref 5–15)
BUN: 20 mg/dL (ref 8–23)
CO2: 24 mmol/L (ref 22–32)
Calcium: 9.6 mg/dL (ref 8.9–10.3)
Chloride: 101 mmol/L (ref 98–111)
Creatinine, Ser: 1.43 mg/dL — ABNORMAL HIGH (ref 0.44–1.00)
GFR calc Af Amer: 42 mL/min — ABNORMAL LOW (ref >60)
GFR calc non Af Amer: 36 mL/min — ABNORMAL LOW (ref >60)
Glucose, Bld: 269 mg/dL — ABNORMAL HIGH (ref 70–99)
Potassium: 4.1 mmol/L (ref 3.5–5.1)
Sodium: 136 mmol/L (ref 135–145)

## 2019-01-12 LAB — CBC
HCT: 35.8 % — ABNORMAL LOW (ref 36.0–46.0)
Hemoglobin: 12 g/dL (ref 12.0–15.0)
MCH: 30.5 pg (ref 26.0–34.0)
MCHC: 33.5 g/dL (ref 30.0–36.0)
MCV: 90.9 fL (ref 80.0–100.0)
Platelets: 276 K/uL (ref 150–400)
RBC: 3.94 MIL/uL (ref 3.87–5.11)
RDW: 14.9 % (ref 11.5–15.5)
WBC: 9.7 K/uL (ref 4.0–10.5)
nRBC: 0 % (ref 0.0–0.2)

## 2019-01-12 MED ORDER — SODIUM CHLORIDE 0.9 % IV BOLUS
1000.0000 mL | Freq: Once | INTRAVENOUS | Status: AC
  Administered 2019-01-12: 1000 mL via INTRAVENOUS

## 2019-01-12 NOTE — Discharge Instructions (Addendum)
Please seek medical attention for any high fevers, chest pain, shortness of breath, change in behavior, persistent vomiting, bloody stool or any other new or concerning symptoms.  

## 2019-01-12 NOTE — ED Provider Notes (Signed)
[facility] Emergency Department Provider Note   ____________________________________________   I have reviewed the triage vital signs and the nursing notes.   HISTORY  Chief Complaint Generalized Body Aches   History limited by: Not Limited   HPI Melanie King is a 73 y.o. female who presents to the emergency department today with primary concern for body aches and weakness.  The patient states that she has not felt well for the past few days.  Today after attending church she felt worse.  She describes her body aches as being located in her extremities.  This is accompanied by some generalized weakness.  Patient denies any chest pain or shortness of breath.  She denies any abdominal pain.  States she has not been eating or drinking as much as she normally does.  Patient has not noticed any change in her urine.  Records reviewed. Per medical record review patient has a history of CHF, HLD, HTN. DM.   Past Medical History:  Diagnosis Date  . Arthritis   . CHF (congestive heart failure) (HCC)   . Coronary arteriosclerosis   . DDD (degenerative disc disease), cervical   . DDD (degenerative disc disease), lumbar   . Depression   . Diabetes mellitus without complication (HCC)   . GERD (gastroesophageal reflux disease)   . Hyperlipidemia   . Hypertension   . Osteoarthritis   . Pacemaker   . Tremor     Patient Active Problem List   Diagnosis Date Noted  . Acute delirium 05/12/2017  . Seizure (HCC) 05/10/2017  . Elevated sedimentation rate 04/04/2017  . Elevated C-reactive protein (CRP) 04/04/2017  . Chest pain, unspecified 04/02/2017  . Bilateral lower extremity pain (Secondary Area of Pain) (R>L) 04/02/2017  . Chronic hip pain, bilateral  (Secondary Area of Pain) (R>L) 04/02/2017  . Chronic bilateral low back pain with bilateral sciatica [facility] Area of Pain) (R>L) 04/02/2017  . Chronic pain syndrome 04/02/2017  . Disorder of bone, unspecified  04/02/2017  . Other long term (current) drug therapy 04/02/2017  . Other specified health status 04/02/2017  . Long term current use of opiate analgesic 04/02/2017  . Hip pain, bilateral 12/06/2016  . Tremor 05/27/2016  . Trigger finger of left hand 02/16/2016  . Osteoarthritis of both hands 02/16/2016  . Angina pectoris (HCC) 01/17/2016  . COPD (chronic obstructive pulmonary disease) (HCC) 01/17/2016  . CAD (coronary artery disease) 01/17/2016  . HLD (hyperlipidemia) 01/17/2016  . Hypertension 01/17/2016  . Sick sinus syndrome (HCC) 01/17/2016  . Chronic pain 01/17/2016  . Personal history of tobacco use, presenting hazards to health 11/16/2015  . Diabetes (HCC) 05/12/2013    Past Surgical History:  Procedure Laterality Date  . ABDOMINAL HYSTERECTOMY    . BREAST BIOPSY Right 1994   neg cyst removed  . CHOLECYSTECTOMY    . EYE SURGERY  1964, 1966, and 1967   bilateral  . FOOT SURGERY Right    cellulitis  . PACEMAKER INSERTION    . SPINE SURGERY     cyst removed; [facility]    Prior to Admission medications   Medication Sig Start Date End Date Taking? Authorizing Provider  acetaminophen (TYLENOL) 325 MG tablet Take 2 tablets (650 mg total) by mouth every 6 (six) hours as needed for mild pain (or Fever >/= 101). 05/21/17   Alford Highland, MD  albuterol (PROVENTIL HFA;VENTOLIN HFA) 108 (90 Base) MCG/ACT inhaler Inhale 2 puffs into the lungs every 6 (six) hours as needed for wheezing or  shortness of breath.    [provider]  amLODipine (NORVASC) 10 MG tablet TAKE 1 TABLET BY MOUTH EVERY DAY 08/28/16   Gabriel Cirri, NP  docusate sodium (COLACE) 100 MG capsule TAKE 1 CAPSULE BY MOUTH EVERY DAY 01/02/17   Gabriel Cirri, NP  feeding supplement, ENSURE ENLIVE, (ENSURE ENLIVE) LIQD Take 237 mLs by mouth 2 (two) times daily between meals. 05/21/17   Alford Highland, MD  fluticasone (FLONASE) 50 MCG/ACT nasal spray Place 1 spray into both nostrils daily. 04/14/16   Gabriel Cirri, NP  HYDROcodone-acetaminophen (NORCO) 5-325 MG tablet Take 1 tablet by mouth every 6 (six) hours as needed for moderate pain. 06/29/17   Irean Hong, MD  insulin aspart (NOVOLOG) 100 UNIT/ML injection Inject 4 Units into the skin 3 (three) times daily with meals. 05/21/17   Alford Highland, MD  insulin glargine (LANTUS) 100 UNIT/ML injection Inject 0.14 mLs (14 Units total) into the skin at bedtime. 05/21/17   Alford Highland, MD  levothyroxine (SYNTHROID) 50 MCG tablet Take 1 tablet (50 mcg total) by mouth daily. 05/21/17 05/21/18  Alford Highland, MD  magnesium oxide (MAG-OX) 400 (241.3 Mg) MG tablet Take 1 tablet (400 mg total) by mouth daily. 05/22/17   Alford Highland, MD  metoprolol tartrate (LOPRESSOR) 25 MG tablet Take 25 mg by mouth 2 (two) times daily.    [provider]  risperiDONE (RISPERDAL M-TABS) 0.5 MG disintegrating tablet Take 1 tablet (0.5 mg total) by mouth 2 (two) times daily. 05/21/17   Alford Highland, MD  traMADol (ULTRAM) 50 MG tablet Take 1 tablet (50 mg total) by mouth every 6 (six) hours as needed. 11/14/18 11/14/19  Emily Filbert, MD  traZODone (DESYREL) 50 MG tablet Take 1 tablet (50 mg total) by mouth at bedtime. 05/21/17   Alford Highland, MD  TRUETRACK TEST test strip USE TO CHECK BLOOD SUGAR LEVEL ONCE DAILY. DX CODE E11.65 05/02/17   Gabriel Cirri, NP  Stann Ore SHORT PEN NEEDLES 31G X 8 MM MISC  01/31/16   [provider]  Vitamin D, Ergocalciferol, (DRISDOL) 50000 units CAPS capsule TAKE ONE CAPSULE BY MOUTH MONTHLY 01/29/17   Olevia Perches P, DO    Allergies Alprazolam, Fentanyl, Meperidine, and Ranitidine hcl  Family History  Problem Relation Age of Onset  . Breast cancer Maternal Aunt 40  . Cancer Mother        colon  . Aneurysm Father     Social History Social History   Tobacco Use  . Smoking status: Current Every Day Smoker    Packs/day: 1.00    Years: 50.00    Pack years: 50.00    Types: Cigarettes  .  Smokeless tobacco: Never Used  Substance Use Topics  . Alcohol use: No  . Drug use: No    Review of Systems Constitutional: No fever/chills. Positive for generalized weakness.  Eyes: No visual changes. ENT: No sore throat. Cardiovascular: Denies chest pain. Respiratory: Denies shortness of breath. Gastrointestinal: No abdominal pain.  No nausea, no vomiting.  No diarrhea.   Genitourinary: Negative for dysuria. Musculoskeletal: Positive for cramping in extremities.  Skin: Negative for rash. Neurological: Negative for headaches, focal weakness or numbness.  ____________________________________________   PHYSICAL EXAM:  VITAL SIGNS: ED Triage Vitals [01/12/19 1612]  Enc Vitals Group     BP (!) 165/57     Pulse Rate 69     Resp 18     Temp 97.8 F (36.6 C)     Temp Source Oral  SpO2 98 %     Weight 152 lb (68.9 kg)     Height 5\' 1"  (1.549 m)     Head Circumference      Peak Flow      Pain Score 8   Constitutional: Alert and oriented.  Eyes: Conjunctivae are normal.  ENT      Head: Normocephalic and atraumatic.      Nose: No congestion/rhinnorhea.      Mouth/Throat: Mucous membranes are moist.      Neck: No stridor. Hematological/Lymphatic/Immunilogical: No cervical lymphadenopathy. Cardiovascular: Normal rate, regular rhythm.  No murmurs, rubs, or gallops.  Respiratory: Normal respiratory effort without tachypnea nor retractions. Breath sounds are clear and equal bilaterally. No wheezes/rales/rhonchi. Gastrointestinal: Soft and non tender. No rebound. No guarding.  Genitourinary: Deferred Musculoskeletal: Normal range of motion in all extremities. No lower extremity edema. Neurologic:  Normal speech and language. No gross focal neurologic deficits are appreciated.  Skin:  Skin is warm, dry and intact. No rash noted. Psychiatric: Mood and affect are normal. Speech and behavior are normal. Patient exhibits appropriate insight and  judgment.  ____________________________________________    LABS (pertinent positives/negatives)  BMP wnl except glu 269, cr 1.43 UA clear, 0-5 rbc and wbc CBC wbc 9.7, hgb 12.0, plt 276  ____________________________________________   EKG  I, Nance Pear, attending physician, personally viewed and interpreted this EKG  EKG Time: 1618 Rate: 70 Rhythm: atrial paced rhythm Axis: left axis deviation Intervals: qtc 462 QRS: LAFB, RBBB ST changes: no st elevation Impression: abnormal ekg  ____________________________________________    RADIOLOGY  CXR No acute cardiopulmonary disease  ____________________________________________   PROCEDURES  Procedures  ____________________________________________   INITIAL IMPRESSION / ASSESSMENT AND PLAN / ED COURSE  Pertinent labs & imaging results that were available during my care of the patient were reviewed by me and considered in my medical decision making (see chart for details).   Patient presented to the emergency department today because of concerns for weakness and extremity cramping.  Patient's vital signs here without any tachycardia or hypotension.  Patient is afebrile.  Work-up does show a slight elevation of creatinine and her glucose is elevated without an anion gap.  No leukocytosis.  Urine without concerning findings for infection.  Given the slight elevation of creatinine and elevated glucose I did have concerns for some dehydration.  Patient was given IV fluids and stated that she felt significant improvement.  Will plan on discharge.  ____________________________________________   FINAL CLINICAL IMPRESSION(S) / ED DIAGNOSES  Final diagnoses:  Dehydration     Note: This dictation was prepared with Dragon dictation. Any transcriptional errors that result from this process are unintentional     Nance Pear, MD 01/12/19 2135

## 2019-01-12 NOTE — ED Triage Notes (Signed)
Pt arrived via POV with reports of body aches and not feeling well since after church,  Pt however state she hasn't felt well for a few weeks and c/o weakness.  Pt denies any COVID contacts, however, state she does attend church without a mask.

## 2019-02-10 ENCOUNTER — Other Ambulatory Visit: Payer: Self-pay | Admitting: Adult Health

## 2019-02-10 DIAGNOSIS — N644 Mastodynia: Secondary | ICD-10-CM

## 2019-03-10 ENCOUNTER — Other Ambulatory Visit: Payer: Self-pay

## 2019-03-10 ENCOUNTER — Observation Stay
Admission: EM | Admit: 2019-03-10 | Discharge: 2019-03-12 | Disposition: A | Discharge status: Home / Self Care | Payer: Medicare Other | Attending: Internal Medicine | Admitting: Internal Medicine

## 2019-03-10 ENCOUNTER — Emergency Department: Payer: Medicare Other

## 2019-03-10 ENCOUNTER — Encounter: Payer: Self-pay | Admitting: Medical Oncology

## 2019-03-10 DIAGNOSIS — N183 Chronic kidney disease, stage 3 unspecified: Secondary | ICD-10-CM | POA: Insufficient documentation

## 2019-03-10 DIAGNOSIS — S12500A Unspecified displaced fracture of sixth cervical vertebra, initial encounter for closed fracture: Principal | ICD-10-CM | POA: Diagnosis present

## 2019-03-10 DIAGNOSIS — Z79899 Other long term (current) drug therapy: Secondary | ICD-10-CM | POA: Diagnosis not present

## 2019-03-10 DIAGNOSIS — K219 Gastro-esophageal reflux disease without esophagitis: Secondary | ICD-10-CM | POA: Diagnosis not present

## 2019-03-10 DIAGNOSIS — Y92009 Unspecified place in unspecified non-institutional (private) residence as the place of occurrence of the external cause: Secondary | ICD-10-CM | POA: Diagnosis not present

## 2019-03-10 DIAGNOSIS — E039 Hypothyroidism, unspecified: Secondary | ICD-10-CM | POA: Diagnosis not present

## 2019-03-10 DIAGNOSIS — E1165 Type 2 diabetes mellitus with hyperglycemia: Secondary | ICD-10-CM | POA: Insufficient documentation

## 2019-03-10 DIAGNOSIS — I25119 Atherosclerotic heart disease of native coronary artery with unspecified angina pectoris: Secondary | ICD-10-CM | POA: Diagnosis not present

## 2019-03-10 DIAGNOSIS — Z79890 Hormone replacement therapy (postmenopausal): Secondary | ICD-10-CM | POA: Diagnosis not present

## 2019-03-10 DIAGNOSIS — W19XXXA Unspecified fall, initial encounter: Secondary | ICD-10-CM | POA: Diagnosis present

## 2019-03-10 DIAGNOSIS — Z8673 Personal history of transient ischemic attack (TIA), and cerebral infarction without residual deficits: Secondary | ICD-10-CM | POA: Diagnosis not present

## 2019-03-10 DIAGNOSIS — F329 Major depressive disorder, single episode, unspecified: Secondary | ICD-10-CM | POA: Diagnosis not present

## 2019-03-10 DIAGNOSIS — G8929 Other chronic pain: Secondary | ICD-10-CM | POA: Diagnosis not present

## 2019-03-10 DIAGNOSIS — F17210 Nicotine dependence, cigarettes, uncomplicated: Secondary | ICD-10-CM | POA: Diagnosis not present

## 2019-03-10 DIAGNOSIS — I2510 Atherosclerotic heart disease of native coronary artery without angina pectoris: Secondary | ICD-10-CM | POA: Diagnosis present

## 2019-03-10 DIAGNOSIS — I130 Hypertensive heart and chronic kidney disease with heart failure and stage 1 through stage 4 chronic kidney disease, or unspecified chronic kidney disease: Secondary | ICD-10-CM | POA: Insufficient documentation

## 2019-03-10 DIAGNOSIS — J449 Chronic obstructive pulmonary disease, unspecified: Secondary | ICD-10-CM | POA: Diagnosis present

## 2019-03-10 DIAGNOSIS — Z885 Allergy status to narcotic agent status: Secondary | ICD-10-CM | POA: Diagnosis not present

## 2019-03-10 DIAGNOSIS — I5032 Chronic diastolic (congestive) heart failure: Secondary | ICD-10-CM | POA: Diagnosis not present

## 2019-03-10 DIAGNOSIS — E785 Hyperlipidemia, unspecified: Secondary | ICD-10-CM | POA: Diagnosis not present

## 2019-03-10 DIAGNOSIS — I10 Essential (primary) hypertension: Secondary | ICD-10-CM

## 2019-03-10 DIAGNOSIS — F0390 Unspecified dementia without behavioral disturbance: Secondary | ICD-10-CM | POA: Diagnosis not present

## 2019-03-10 DIAGNOSIS — Z20822 Contact with and (suspected) exposure to covid-19: Secondary | ICD-10-CM | POA: Diagnosis not present

## 2019-03-10 DIAGNOSIS — G9341 Metabolic encephalopathy: Secondary | ICD-10-CM | POA: Diagnosis present

## 2019-03-10 DIAGNOSIS — E119 Type 2 diabetes mellitus without complications: Secondary | ICD-10-CM | POA: Diagnosis present

## 2019-03-10 DIAGNOSIS — R4182 Altered mental status, unspecified: Secondary | ICD-10-CM | POA: Diagnosis present

## 2019-03-10 DIAGNOSIS — Z950 Presence of cardiac pacemaker: Secondary | ICD-10-CM | POA: Insufficient documentation

## 2019-03-10 DIAGNOSIS — R296 Repeated falls: Secondary | ICD-10-CM | POA: Insufficient documentation

## 2019-03-10 DIAGNOSIS — S12501A Unspecified nondisplaced fracture of sixth cervical vertebra, initial encounter for closed fracture: Secondary | ICD-10-CM

## 2019-03-10 DIAGNOSIS — J441 Chronic obstructive pulmonary disease with (acute) exacerbation: Secondary | ICD-10-CM | POA: Diagnosis present

## 2019-03-10 DIAGNOSIS — E1122 Type 2 diabetes mellitus with diabetic chronic kidney disease: Secondary | ICD-10-CM | POA: Insufficient documentation

## 2019-03-10 DIAGNOSIS — Z720 Tobacco use: Secondary | ICD-10-CM

## 2019-03-10 DIAGNOSIS — Z794 Long term (current) use of insulin: Secondary | ICD-10-CM | POA: Diagnosis not present

## 2019-03-10 DIAGNOSIS — M1990 Unspecified osteoarthritis, unspecified site: Secondary | ICD-10-CM | POA: Insufficient documentation

## 2019-03-10 DIAGNOSIS — E1129 Type 2 diabetes mellitus with other diabetic kidney complication: Secondary | ICD-10-CM | POA: Diagnosis present

## 2019-03-10 DIAGNOSIS — I5033 Acute on chronic diastolic (congestive) heart failure: Secondary | ICD-10-CM | POA: Diagnosis present

## 2019-03-10 DIAGNOSIS — F32A Depression, unspecified: Secondary | ICD-10-CM | POA: Diagnosis present

## 2019-03-10 DIAGNOSIS — S0990XA Unspecified injury of head, initial encounter: Secondary | ICD-10-CM | POA: Diagnosis not present

## 2019-03-10 LAB — URINALYSIS, COMPLETE (UACMP) WITH MICROSCOPIC
Bilirubin Urine: NEGATIVE
Glucose, UA: NEGATIVE mg/dL
Hgb urine dipstick: NEGATIVE
Ketones, ur: NEGATIVE mg/dL
Leukocytes,Ua: NEGATIVE
Nitrite: NEGATIVE
Protein, ur: 100 mg/dL — AB
Specific Gravity, Urine: 1.015 (ref 1.005–1.030)
pH: 5 (ref 5.0–8.0)

## 2019-03-10 LAB — COMPREHENSIVE METABOLIC PANEL
ALT: 20 U/L (ref 0–44)
AST: 19 U/L (ref 15–41)
Albumin: 4.1 g/dL (ref 3.5–5.0)
Alkaline Phosphatase: 85 U/L (ref 38–126)
Anion gap: 12 (ref 5–15)
BUN: 30 mg/dL — ABNORMAL HIGH (ref 8–23)
CO2: 24 mmol/L (ref 22–32)
Calcium: 9.6 mg/dL (ref 8.9–10.3)
Chloride: 100 mmol/L (ref 98–111)
Creatinine, Ser: 1.49 mg/dL — ABNORMAL HIGH (ref 0.44–1.00)
GFR calc Af Amer: 40 mL/min — ABNORMAL LOW (ref >60)
GFR calc non Af Amer: 34 mL/min — ABNORMAL LOW (ref >60)
Glucose, Bld: 227 mg/dL — ABNORMAL HIGH (ref 70–99)
Potassium: 4 mmol/L (ref 3.5–5.1)
Sodium: 136 mmol/L (ref 135–145)
Total Bilirubin: 0.4 mg/dL (ref 0.3–1.2)
Total Protein: 7.8 g/dL (ref 6.5–8.1)

## 2019-03-10 LAB — RESPIRATORY PANEL BY RT PCR (FLU A&B, COVID)
Influenza A by PCR: NEGATIVE
Influenza B by PCR: NEGATIVE
SARS Coronavirus 2 by RT PCR: NEGATIVE

## 2019-03-10 LAB — CBC
HCT: 39 % (ref 36.0–46.0)
Hemoglobin: 12.8 g/dL (ref 12.0–15.0)
MCH: 30.1 pg (ref 26.0–34.0)
MCHC: 32.8 g/dL (ref 30.0–36.0)
MCV: 91.8 fL (ref 80.0–100.0)
Platelets: 279 10*3/uL (ref 150–400)
RBC: 4.25 MIL/uL (ref 3.87–5.11)
RDW: 15.5 % (ref 11.5–15.5)
WBC: 10.4 10*3/uL (ref 4.0–10.5)
nRBC: 0 % (ref 0.0–0.2)

## 2019-03-10 LAB — VITAMIN B12: Vitamin B-12: 3363 pg/mL — ABNORMAL HIGH (ref 180–914)

## 2019-03-10 LAB — GLUCOSE, CAPILLARY
Glucose-Capillary: 223 mg/dL — ABNORMAL HIGH (ref 70–99)
Glucose-Capillary: 245 mg/dL — ABNORMAL HIGH (ref 70–99)
Glucose-Capillary: 202 mg/dL — ABNORMAL HIGH (ref 70–99)

## 2019-03-10 LAB — BRAIN NATRIURETIC PEPTIDE: B Natriuretic Peptide: 156 pg/mL — ABNORMAL HIGH (ref 0.0–100.0)

## 2019-03-10 LAB — TSH: TSH: 83.944 u[IU]/mL — ABNORMAL HIGH (ref 0.350–4.500)

## 2019-03-10 MED ORDER — PNEUMOCOCCAL VAC POLYVALENT 25 MCG/0.5ML IJ INJ: 0.5000 mL | INJECTION | INTRAMUSCULAR | Status: DC

## 2019-03-10 MED ORDER — FAMOTIDINE 20 MG PO TABS
20.0000 mg | ORAL_TABLET | Freq: Every day | ORAL | Status: DC
  Administered 2019-03-11 – 2019-03-12 (×2): 20 mg via ORAL
  Filled 2019-03-10 (×2): qty 1

## 2019-03-10 MED ORDER — SODIUM CHLORIDE 0.9 % IV SOLN: INTRAVENOUS | Status: DC

## 2019-03-10 MED ORDER — INSULIN ASPART 100 UNIT/ML ~~LOC~~ SOLN: 0.0000 [IU] | SUBCUTANEOUS | Status: DC

## 2019-03-10 MED ORDER — GABAPENTIN 100 MG PO CAPS
100.0000 mg | ORAL_CAPSULE | Freq: Two times a day (BID) | ORAL | Status: DC
  Administered 2019-03-10 – 2019-03-12 (×4): 100 mg via ORAL
  Filled 2019-03-10 (×6): qty 1

## 2019-03-10 MED ORDER — VITAMIN B-12 1000 MCG PO TABS
1000.0000 ug | ORAL_TABLET | Freq: Every day | ORAL | Status: DC
  Administered 2019-03-11 – 2019-03-12 (×2): 1000 ug via ORAL
  Filled 2019-03-10 (×3): qty 1

## 2019-03-10 MED ORDER — INFLUENZA VAC A&B SA ADJ QUAD 0.5 ML IM PRSY
0.5000 mL | PREFILLED_SYRINGE | INTRAMUSCULAR | Status: DC
  Filled 2019-03-10: qty 0.5

## 2019-03-10 MED ORDER — INSULIN ASPART 100 UNIT/ML ~~LOC~~ SOLN
0.0000 [IU] | Freq: Three times a day (TID) | SUBCUTANEOUS | Status: DC
  Administered 2019-03-10 – 2019-03-11 (×3): 3 [IU] via SUBCUTANEOUS
  Administered 2019-03-11: 7 [IU] via SUBCUTANEOUS
  Administered 2019-03-12: 3 [IU] via SUBCUTANEOUS
  Administered 2019-03-12: 7 [IU] via SUBCUTANEOUS
  Filled 2019-03-10 (×6): qty 1

## 2019-03-10 MED ORDER — LOSARTAN POTASSIUM 50 MG PO TABS
50.0000 mg | ORAL_TABLET | Freq: Every day | ORAL | Status: DC
  Administered 2019-03-11 – 2019-03-12 (×2): 50 mg via ORAL
  Filled 2019-03-10 (×2): qty 1

## 2019-03-10 MED ORDER — HYDRALAZINE HCL 25 MG PO TABS
25.0000 mg | ORAL_TABLET | Freq: Three times a day (TID) | ORAL | Status: DC | PRN
  Administered 2019-03-11: 25 mg via ORAL
  Filled 2019-03-10 (×2): qty 1

## 2019-03-10 MED ORDER — OXYCODONE-ACETAMINOPHEN 5-325 MG PO TABS
1.0000 | ORAL_TABLET | ORAL | Status: DC | PRN
  Administered 2019-03-10 – 2019-03-11 (×3): 1 via ORAL
  Filled 2019-03-10 (×3): qty 1

## 2019-03-10 MED ORDER — BENZTROPINE MESYLATE 0.5 MG PO TABS
0.5000 mg | ORAL_TABLET | Freq: Two times a day (BID) | ORAL | Status: DC
  Administered 2019-03-10 – 2019-03-12 (×5): 0.5 mg via ORAL
  Filled 2019-03-10 (×6): qty 1

## 2019-03-10 MED ORDER — METOPROLOL TARTRATE 25 MG PO TABS
25.0000 mg | ORAL_TABLET | Freq: Two times a day (BID) | ORAL | Status: DC
  Administered 2019-03-10 – 2019-03-12 (×5): 25 mg via ORAL
  Filled 2019-03-10 (×5): qty 1

## 2019-03-10 MED ORDER — ACETAMINOPHEN 650 MG RE SUPP: 650.0000 mg | Freq: Four times a day (QID) | RECTAL | Status: DC | PRN

## 2019-03-10 MED ORDER — PRIMIDONE 50 MG PO TABS
50.0000 mg | ORAL_TABLET | Freq: Two times a day (BID) | ORAL | Status: DC
  Administered 2019-03-10 – 2019-03-12 (×4): 50 mg via ORAL
  Filled 2019-03-10 (×7): qty 1

## 2019-03-10 MED ORDER — MONTELUKAST SODIUM 10 MG PO TABS
10.0000 mg | ORAL_TABLET | Freq: Every day | ORAL | Status: DC
  Administered 2019-03-10 – 2019-03-11 (×2): 10 mg via ORAL
  Filled 2019-03-10 (×2): qty 1

## 2019-03-10 MED ORDER — ONDANSETRON HCL 4 MG/2ML IJ SOLN: 4.0000 mg | Freq: Three times a day (TID) | INTRAMUSCULAR | Status: DC | PRN

## 2019-03-10 MED ORDER — LEVOTHYROXINE SODIUM 75 MCG PO TABS
75.0000 ug | ORAL_TABLET | Freq: Every day | ORAL | Status: DC
  Administered 2019-03-11 – 2019-03-12 (×2): 75 ug via ORAL
  Filled 2019-03-10: qty 3
  Filled 2019-03-10: qty 1
  Filled 2019-03-10: qty 3
  Filled 2019-03-10: qty 1

## 2019-03-10 MED ORDER — GABAPENTIN 100 MG PO CAPS: 100.0000 mg | ORAL_CAPSULE | ORAL | Status: DC

## 2019-03-10 MED ORDER — FAMOTIDINE 20 MG PO TABS
40.0000 mg | ORAL_TABLET | Freq: Every day | ORAL | Status: DC
  Filled 2019-03-10: qty 2

## 2019-03-10 MED ORDER — ASPIRIN EC 81 MG PO TBEC
81.0000 mg | DELAYED_RELEASE_TABLET | Freq: Every day | ORAL | Status: DC
  Administered 2019-03-10 – 2019-03-12 (×3): 81 mg via ORAL
  Filled 2019-03-10 (×3): qty 1

## 2019-03-10 MED ORDER — CALCITRIOL 0.25 MCG PO CAPS
0.2500 ug | ORAL_CAPSULE | Freq: Every day | ORAL | Status: DC
  Administered 2019-03-10 – 2019-03-12 (×3): 0.25 ug via ORAL
  Filled 2019-03-10 (×3): qty 1

## 2019-03-10 MED ORDER — INSULIN ASPART 100 UNIT/ML ~~LOC~~ SOLN
0.0000 [IU] | Freq: Every day | SUBCUTANEOUS | Status: DC
  Administered 2019-03-10 – 2019-03-11 (×2): 2 [IU] via SUBCUTANEOUS
  Filled 2019-03-10 (×2): qty 1

## 2019-03-10 MED ORDER — ROSUVASTATIN CALCIUM 20 MG PO TABS
40.0000 mg | ORAL_TABLET | Freq: Every day | ORAL | Status: DC
  Administered 2019-03-10 – 2019-03-11 (×2): 40 mg via ORAL
  Filled 2019-03-10 (×3): qty 2

## 2019-03-10 MED ORDER — GABAPENTIN 300 MG PO CAPS
300.0000 mg | ORAL_CAPSULE | Freq: Every day | ORAL | Status: DC
  Administered 2019-03-10 – 2019-03-11 (×2): 300 mg via ORAL
  Filled 2019-03-10 (×2): qty 1

## 2019-03-10 MED ORDER — ALBUTEROL SULFATE HFA 108 (90 BASE) MCG/ACT IN AERS
2.0000 | INHALATION_SPRAY | RESPIRATORY_TRACT | Status: DC | PRN
  Filled 2019-03-10: qty 6.7

## 2019-03-10 MED ORDER — ENOXAPARIN SODIUM 40 MG/0.4ML ~~LOC~~ SOLN
40.0000 mg | SUBCUTANEOUS | Status: DC
  Administered 2019-03-10 – 2019-03-12 (×3): 40 mg via SUBCUTANEOUS
  Filled 2019-03-10 (×3): qty 0.4

## 2019-03-10 MED ORDER — AMLODIPINE BESYLATE 10 MG PO TABS
10.0000 mg | ORAL_TABLET | Freq: Every day | ORAL | Status: DC
  Administered 2019-03-10 – 2019-03-12 (×3): 10 mg via ORAL
  Filled 2019-03-10 (×3): qty 1

## 2019-03-10 MED ORDER — CITALOPRAM HYDROBROMIDE 20 MG PO TABS
20.0000 mg | ORAL_TABLET | Freq: Every day | ORAL | Status: DC
  Administered 2019-03-10 – 2019-03-12 (×3): 20 mg via ORAL
  Filled 2019-03-10 (×3): qty 1

## 2019-03-10 MED ORDER — NICOTINE 21 MG/24HR TD PT24
21.0000 mg | MEDICATED_PATCH | Freq: Every day | TRANSDERMAL | Status: DC
  Administered 2019-03-11 – 2019-03-12 (×2): 21 mg via TRANSDERMAL
  Filled 2019-03-10 (×3): qty 1

## 2019-03-10 MED ORDER — FENOFIBRATE 54 MG PO TABS
54.0000 mg | ORAL_TABLET | Freq: Every day | ORAL | Status: DC
  Administered 2019-03-10 – 2019-03-12 (×3): 54 mg via ORAL
  Filled 2019-03-10 (×3): qty 1

## 2019-03-10 MED ORDER — ACETAMINOPHEN 325 MG PO TABS: 650.0000 mg | ORAL_TABLET | Freq: Four times a day (QID) | ORAL | Status: DC | PRN

## 2019-03-10 MED ORDER — BREXPIPRAZOLE 1 MG PO TABS
1.0000 mg | ORAL_TABLET | Freq: Every day | ORAL | Status: DC
  Administered 2019-03-10 – 2019-03-11 (×2): 1 mg via ORAL
  Filled 2019-03-10 (×3): qty 1

## 2019-03-10 NOTE — ED Notes (Signed)
Brewing technologist on phone checking on [employer] for pt at this time.

## 2019-03-10 NOTE — ED Notes (Signed)
Patient to waiting room via wheelchair by EMS.  Per EMS call for a fall, reports pain very unsteady on feet.  States roommate told them she had been altered for a few month.  VS 93.3 (oral after several attempts), hr 70, bp 152/67, pulse oxi 97% on room air, CBG 266.

## 2019-03-10 NOTE — Progress Notes (Signed)
PT Cancellation Note  Patient Details Name: Melanie King MRN: [id number] DOB: February 25, 1946   Cancelled Treatment:    Reason Eval/Treat Not Completed: Other (comment). Consult received and chart reviewed. Evaluation attempted, however pt very lethargic, snoring upon arrival. Took copious sternal rubs to awaken. ONly able to stay awake briefly prior to dosing off again. Unable to maintain alertness long enough to participate in therapy. Will re-attempt next date.   Berenis Corter 03/10/2019, 3:06 PM  Elizabeth Palau, PT, DPT [phone]

## 2019-03-10 NOTE — ED Notes (Signed)
Pt able to answer some questions regarding her fall. Pt states she was stumbling and possibly got dizzy. Pt unable to grip hands or lift legs at this time. Pt also has some drainage noted to right eye. Pt is in C-collar and placed on monitor. IV accessed gained.

## 2019-03-10 NOTE — ED Triage Notes (Signed)
Pt to ED from home via ems with reports of fall this am, per pts room mate she has not been acting herself for a few weeks. Pt confused. Abrasion noted to rt elbow. Unsure if there was head injury.

## 2019-03-10 NOTE — H&P (Signed)
History and Physical    Melanie King ZOX:[id number] DOB: 10-08-1945 DOA: 03/10/2019  Referring MD/NP/PA:   PCP: Patient, No Pcp Per   Patient coming from:  The patient is coming from home.  At baseline, pt is independent for most of ADL.        Chief Complaint: fall, neck pain and AMS  HPI: Melanie King is a 74 y.o. female with medical history significant of hypertension, hyperlipidemia, diabetes mellitus, GERD, hypothyroidism, depression, pacemaker placement, CHF, CAD, tobacco abuse, CKD stage III, who presents with a fall, neck pain, altered mental status.  Per report, patient's mental status has been declining in the past few weeks. Per pts room mate she has not been acting herself and confused recently. Pt had unwitnessed fall this AM. He has pain her neck.  Denies numbness or tingling to extremities.  Patient does not have chest pain, shortness breath, cough.  No nausea vomiting, diarrhea, abdominal pain, symptoms of UTI.  No facial droop or slurred speech.   ED Course: pt was found to have WBC 10.4, negative RVP for COVID-19 test, pending urinalysis, renal function close to baseline, temperature normal, blood pressure 119/105, heart rate of 73, oxygen saturation 94-100% on room air, chest x-ray showed atelectasis.  CT head is negative for acute intracranial abnormalities, but CT of C-spine showed C6 fracture.  Patient is placed on MedSurg bed for observation.  Neurosurgeon, Dr. Adriana Simas was consulted.  Review of Systems: Cannot be reviewed accurately due to altered mental status.  Allergy:  Allergies  Allergen Reactions  . Alprazolam Swelling  . Fentanyl Other (See Comments)    "burning and hot"  . Meperidine Itching    Other reaction(s): Other (See Comments)  . Ranitidine Hcl     Other reaction(s): Other (See Comments)    Past Medical History:  Diagnosis Date  . Arthritis   . CHF (congestive heart failure) (HCC)   . Coronary arteriosclerosis   . DDD (degenerative disc  disease), cervical   . DDD (degenerative disc disease), lumbar   . Depression   . Diabetes mellitus without complication (HCC)   . GERD (gastroesophageal reflux disease)   . Hyperlipidemia   . Hypertension   . Osteoarthritis   . Pacemaker   . Tremor     Past Surgical History:  Procedure Laterality Date  . ABDOMINAL HYSTERECTOMY    . BREAST BIOPSY Right 1994   neg cyst removed  . CHOLECYSTECTOMY    . EYE SURGERY  1964, 1966, and 1967   bilateral  . FOOT SURGERY Right    cellulitis  . PACEMAKER INSERTION    . SPINE SURGERY     cyst removed; [facility]    Social History:  reports that she has been smoking cigarettes. She has a 50.00 pack-year smoking history. She has never used smokeless tobacco. She reports that she does not drink alcohol or use drugs.  Family History:  Family History  Problem Relation Age of Onset  . Breast cancer Maternal Aunt 40  . Cancer Mother        colon  . Aneurysm Father      Prior to Admission medications   Medication Sig Start Date End Date Taking? Authorizing Provider  acetaminophen (TYLENOL) 325 MG tablet Take 2 tablets (650 mg total) by mouth every 6 (six) hours as needed for mild pain (or Fever >/= 101). 05/21/17   Alford Highland, MD  albuterol (PROVENTIL HFA;VENTOLIN HFA) 108 (90 Base) MCG/ACT inhaler Inhale 2 puffs into the  lungs every 6 (six) hours as needed for wheezing or shortness of breath.    [provider]  amLODipine (NORVASC) 10 MG tablet TAKE 1 TABLET BY MOUTH EVERY DAY 08/28/16   Kathrine Haddock, NP  docusate sodium (COLACE) 100 MG capsule TAKE 1 CAPSULE BY MOUTH EVERY DAY 01/02/17   Kathrine Haddock, NP  feeding supplement, ENSURE ENLIVE, (ENSURE ENLIVE) LIQD Take 237 mLs by mouth 2 (two) times daily between meals. 05/21/17   Loletha Grayer, MD  fluticasone (FLONASE) 50 MCG/ACT nasal spray Place 1 spray into both nostrils daily. 04/14/16   Kathrine Haddock, NP  HYDROcodone-acetaminophen (NORCO) 5-325 MG tablet Take 1  tablet by mouth every 6 (six) hours as needed for moderate pain. 06/29/17   Paulette Blanch, MD  insulin aspart (NOVOLOG) 100 UNIT/ML injection Inject 4 Units into the skin 3 (three) times daily with meals. 05/21/17   Loletha Grayer, MD  insulin glargine (LANTUS) 100 UNIT/ML injection Inject 0.14 mLs (14 Units total) into the skin at bedtime. 05/21/17   Loletha Grayer, MD  levothyroxine (SYNTHROID) 50 MCG tablet Take 1 tablet (50 mcg total) by mouth daily. 05/21/17 05/21/18  Loletha Grayer, MD  magnesium oxide (MAG-OX) 400 (241.3 Mg) MG tablet Take 1 tablet (400 mg total) by mouth daily. 05/22/17   Loletha Grayer, MD  metoprolol tartrate (LOPRESSOR) 25 MG tablet Take 25 mg by mouth 2 (two) times daily.    [provider]  risperiDONE (RISPERDAL M-TABS) 0.5 MG disintegrating tablet Take 1 tablet (0.5 mg total) by mouth 2 (two) times daily. 05/21/17   Loletha Grayer, MD  traMADol (ULTRAM) 50 MG tablet Take 1 tablet (50 mg total) by mouth every 6 (six) hours as needed. 11/14/18 11/14/19  Earleen Newport, MD  traZODone (DESYREL) 50 MG tablet Take 1 tablet (50 mg total) by mouth at bedtime. 05/21/17   Loletha Grayer, MD  TRUETRACK TEST test strip USE TO CHECK BLOOD SUGAR LEVEL ONCE DAILY. DX CODE E11.65 05/02/17   Kathrine Haddock, NP  Flossie Buffy SHORT PEN NEEDLES 31G X 8 MM [place]  01/31/16   [provider]  Vitamin D, Ergocalciferol, (DRISDOL) 50000 units CAPS capsule TAKE ONE CAPSULE BY MOUTH MONTHLY 01/29/17   Valerie Roys, DO    Physical Exam: Vitals:   03/10/19 1030 03/10/19 1100 03/10/19 1319 03/10/19 1608  BP: (!) 178/65 (!) 189/77 (!) 177/76 (!) 175/78  Pulse: 70 70 70 70  Resp: 14 15 14 15   Temp:   97.8 F (36.6 C) 98 F (36.7 C)  TempSrc:   Oral Oral  SpO2: 98% 99% 100% 99%  Weight:      Height:       General: Not in acute distress HEENT:       Eyes: PERRL, EOMI, no scleral icterus.       ENT: No discharge from the ears and nose, no pharynx injection, no  tonsillar enlargement.        Neck: No JVD, no bruit, no mass felt. Heme: No neck lymph node enlargement. Cardiac: S1/S2, RRR, No murmurs, No gallops or rubs. Respiratory:  No rales, wheezing, rhonchi or rubs. GI: Soft, nondistended, nontender, no rebound pain, no organomegaly, BS present. GU: No hematuria Ext: No pitting leg edema bilaterally. 2+DP/PT pulse bilaterally. Musculoskeletal: has neck tenderness Skin: No rashes.  Neuro: confused, cranial nerves II-XII grossly intact. Psych: Patient is not psychotic, no suicidal or hemocidal ideation.  Labs on Admission: I have personally reviewed following labs and imaging studies  CBC: Recent  Labs  Lab 03/10/19 0730  WBC 10.4  HGB 12.8  HCT 39.0  MCV 91.8  PLT 279   Basic Metabolic Panel: Recent Labs  Lab 03/10/19 0730  NA 136  K 4.0  CL 100  CO2 24  GLUCOSE 227*  BUN 30*  CREATININE 1.49*  CALCIUM 9.6   GFR: Estimated Creatinine Clearance: 31.1 mL/min (A) (by C-G formula based on SCr of 1.49 mg/dL (H)). Liver Function Tests: Recent Labs  Lab 03/10/19 0730  AST 19  ALT 20  ALKPHOS 85  BILITOT 0.4  PROT 7.8  ALBUMIN 4.1   No results for input(s): LIPASE, AMYLASE in the last 168 hours. No results for input(s): AMMONIA in the last 168 hours. Coagulation Profile: No results for input(s): INR, PROTIME in the last 168 hours. Cardiac Enzymes: No results for input(s): CKTOTAL, CKMB, CKMBINDEX, TROPONINI in the last 168 hours. BNP (last 3 results) No results for input(s): PROBNP in the last 8760 hours. HbA1C: No results for input(s): HGBA1C in the last 72 hours. CBG: Recent Labs  Lab 03/10/19 1321 03/10/19 1658  GLUCAP 223* 245*   Lipid Profile: No results for input(s): CHOL, HDL, LDLCALC, TRIG, CHOLHDL, LDLDIRECT in the last 72 hours. Thyroid Function Tests: No results for input(s): TSH, T4TOTAL, FREET4, T3FREE, THYROIDAB in the last 72 hours. Anemia Panel: Recent Labs    03/10/19 1343  VITAMINB12  3,363*   Urine analysis:    Component Value Date/Time   COLORURINE YELLOW (A) 03/10/2019 1739   APPEARANCEUR CLEAR (A) 03/10/2019 1739   APPEARANCEUR Clear 03/24/2013 1530   LABSPEC 1.015 03/10/2019 1739   LABSPEC 1.009 03/24/2013 1530   PHURINE 5.0 03/10/2019 1739   GLUCOSEU NEGATIVE 03/10/2019 1739   GLUCOSEU 50 mg/dL 24/23/5361 4431   HGBUR NEGATIVE 03/10/2019 1739   BILIRUBINUR NEGATIVE 03/10/2019 1739   BILIRUBINUR Negative 03/24/2013 1530   KETONESUR NEGATIVE 03/10/2019 1739   PROTEINUR 100 (A) 03/10/2019 1739   NITRITE NEGATIVE 03/10/2019 1739   LEUKOCYTESUR NEGATIVE 03/10/2019 1739   LEUKOCYTESUR Negative 03/24/2013 1530   Sepsis Labs: @LABRCNTIP (procalcitonin:4,lacticidven:4) ) Recent Results (from the past 240 hour(s))  Respiratory Panel by RT PCR (Flu A&B, Covid) - Nasopharyngeal Swab     Status: None   Collection Time: 03/10/19 10:34 AM   Specimen: Nasopharyngeal Swab  Result Value Ref Range Status   SARS Coronavirus 2 by RT PCR NEGATIVE NEGATIVE Final    Comment: (NOTE) SARS-CoV-2 target nucleic acids are NOT DETECTED. The SARS-CoV-2 RNA is generally detectable in upper respiratoy specimens during the acute phase of infection. The lowest concentration of SARS-CoV-2 viral copies this assay can detect is 131 copies/mL. A negative result does not preclude SARS-Cov-2 infection and should not be used as the sole basis for treatment or other patient management decisions. A negative result may occur with  improper specimen collection/handling, submission of specimen other than nasopharyngeal swab, presence of viral mutation(s) within the areas targeted by this assay, and inadequate number of viral copies (<131 copies/mL). A negative result must be combined with clinical observations, patient history, and epidemiological information. The expected result is Negative. Fact Sheet for Patients:  05/08/19 Fact Sheet for Healthcare  Providers:  https://www.moore.com/ This test is not yet ap proved or cleared by the https://www.young.biz/ FDA and  has been authorized for detection and/or diagnosis of SARS-CoV-2 by FDA under an Emergency Use Authorization (EUA). This EUA will remain  in effect (meaning this test can be used) for the duration of the COVID-19 declaration under Section 564(b)(1)  of the Act, 21 U.S.C. section 360bbb-3(b)(1), unless the authorization is terminated or revoked sooner.    Influenza A by PCR NEGATIVE NEGATIVE Final   Influenza B by PCR NEGATIVE NEGATIVE Final    Comment: (NOTE) The Xpert Xpress SARS-CoV-2/FLU/RSV assay is intended as an aid in  the diagnosis of influenza from Nasopharyngeal swab specimens and  should not be used as a sole basis for treatment. Nasal washings and  aspirates are unacceptable for Xpert Xpress SARS-CoV-2/FLU/RSV  testing. Fact Sheet for Patients: https://www.moore.com/ Fact Sheet for Healthcare Providers: https://www.young.biz/ This test is not yet approved or cleared by the [place] FDA and  has been authorized for detection and/or diagnosis of SARS-CoV-2 by  FDA under an Emergency Use Authorization (EUA). This EUA will remain  in effect (meaning this test can be used) for the duration of the  Covid-19 declaration under Section 564(b)(1) of the Act, 21  U.S.C. section 360bbb-3(b)(1), unless the authorization is  terminated or revoked. Performed at [facility], [address], [place], [place] [address]      Radiological Exams on Admission: CT Head Wo Contrast  Result Date: 03/10/2019 CLINICAL DATA:  HEAD TRAUMA. MODERATE TO SEVERE. ALTERED MENTAL STATUS. EXAM: CT HEAD WITHOUT CONTRAST CT CERVICAL SPINE WITHOUT CONTRAST TECHNIQUE: Multidetector CT imaging of the head and cervical spine was performed following the standard protocol without intravenous contrast. Multiplanar CT image  reconstructions of the cervical spine were also generated. COMPARISON:  CT head without contrast 05/10/2017 FINDINGS: CT HEAD FINDINGS Brain: Mild atrophy and white matter changes are stable. A remote lacunar infarct is again seen in the right lentiform nucleus. No acute infarct, hemorrhage, or mass lesion is present. The ventricles are proportionate to the degree of atrophy. No significant extraaxial fluid collection is present. The brainstem and cerebellum are within normal limits. Vascular: Atherosclerotic calcifications are present within the cavernous internal carotid arteries bilaterally. Calcifications present at the dural margin of both vertebral arteries. No hyperdense vessel is present. Skull: Calvarium is intact. No focal lytic or blastic lesions are present. No significant extracranial soft tissue lesion is present. Sinuses/Orbits: The paranasal sinuses and mastoid air cells are clear. A left lens replacement is present. Globes and orbits are otherwise within normal limits. CT CERVICAL SPINE FINDINGS Alignment: No significant listhesis is present. There is straightening and slight reversal of the normal cervical lordosis. Mild rightward curvature is present in the upper cervical spine. Skull base and vertebrae: Craniocervical junction is normal. Vertebral body heights are within normal limits. A fracture is present anteriorly on the left along the inferior endplate of C6. This predominantly involves the anterior endplate spur although there is portion of the vertebral body. No associated soft tissue edema or hematoma is evident. No other fractures are present. Soft tissues and spinal canal: No prevertebral fluid or swelling. No visible canal hematoma. Atherosclerotic calcifications are present at the carotid bifurcations bilaterally. Luminal narrowing is worse left than right. Disc levels: Multilevel degenerative changes are present. Central and foraminal narrowing is greatest at C3-4 due to a  broad-based disc osteophyte complex and bilateral facet hypertrophy. Facet spurring is greatest at C4-5, left greater than right. There is ankylosis across the disc space at C4-5. Upper chest: Lung apices are clear. Thoracic inlet is within normal limits. IMPRESSION: 1. Fracture along the anterior inferior endplate of C6 with minimal displacement. No soft tissue changes are present. This fracture may not be acute. MRI could be used for further evaluation and assessment of acuity or any  soft tissue injury. 2. No acute intracranial abnormality or significant interval change. 3. Stable atrophy and white matter disease. 4. Stable remote lacunar infarct of the right lentiform nucleus. 5. Multilevel degenerative changes of the cervical spine as described. 6. Atherosclerosis. These results were called by telephone at the time of interpretation on 03/10/2019 at 8:08 am to provider Jene Every , who verbally acknowledged these results. Electronically Signed   By: Marin Roberts M.D.   On: 03/10/2019 08:08   CT Cervical Spine Wo Contrast  Result Date: 03/10/2019 CLINICAL DATA:  HEAD TRAUMA. MODERATE TO SEVERE. ALTERED MENTAL STATUS. EXAM: CT HEAD WITHOUT CONTRAST CT CERVICAL SPINE WITHOUT CONTRAST TECHNIQUE: Multidetector CT imaging of the head and cervical spine was performed following the standard protocol without intravenous contrast. Multiplanar CT image reconstructions of the cervical spine were also generated. COMPARISON:  CT head without contrast 05/10/2017 FINDINGS: CT HEAD FINDINGS Brain: Mild atrophy and white matter changes are stable. A remote lacunar infarct is again seen in the right lentiform nucleus. No acute infarct, hemorrhage, or mass lesion is present. The ventricles are proportionate to the degree of atrophy. No significant extraaxial fluid collection is present. The brainstem and cerebellum are within normal limits. Vascular: Atherosclerotic calcifications are present within the cavernous  internal carotid arteries bilaterally. Calcifications present at the dural margin of both vertebral arteries. No hyperdense vessel is present. Skull: Calvarium is intact. No focal lytic or blastic lesions are present. No significant extracranial soft tissue lesion is present. Sinuses/Orbits: The paranasal sinuses and mastoid air cells are clear. A left lens replacement is present. Globes and orbits are otherwise within normal limits. CT CERVICAL SPINE FINDINGS Alignment: No significant listhesis is present. There is straightening and slight reversal of the normal cervical lordosis. Mild rightward curvature is present in the upper cervical spine. Skull base and vertebrae: Craniocervical junction is normal. Vertebral body heights are within normal limits. A fracture is present anteriorly on the left along the inferior endplate of C6. This predominantly involves the anterior endplate spur although there is portion of the vertebral body. No associated soft tissue edema or hematoma is evident. No other fractures are present. Soft tissues and spinal canal: No prevertebral fluid or swelling. No visible canal hematoma. Atherosclerotic calcifications are present at the carotid bifurcations bilaterally. Luminal narrowing is worse left than right. Disc levels: Multilevel degenerative changes are present. Central and foraminal narrowing is greatest at C3-4 due to a broad-based disc osteophyte complex and bilateral facet hypertrophy. Facet spurring is greatest at C4-5, left greater than right. There is ankylosis across the disc space at C4-5. Upper chest: Lung apices are clear. Thoracic inlet is within normal limits. IMPRESSION: 1. Fracture along the anterior inferior endplate of C6 with minimal displacement. No soft tissue changes are present. This fracture may not be acute. MRI could be used for further evaluation and assessment of acuity or any soft tissue injury. 2. No acute intracranial abnormality or significant interval  change. 3. Stable atrophy and white matter disease. 4. Stable remote lacunar infarct of the right lentiform nucleus. 5. Multilevel degenerative changes of the cervical spine as described. 6. Atherosclerosis. These results were called by telephone at the time of interpretation on 03/10/2019 at 8:08 am to provider Jene Every , who verbally acknowledged these results. Electronically Signed   By: Marin Roberts M.D.   On: 03/10/2019 08:08   DG Chest Port 1 View  Result Date: 03/10/2019 CLINICAL DATA:  74 year old female with a history of altered mental status EXAM:  PORTABLE CHEST 1 VIEW COMPARISON:  01/12/2019 FINDINGS: Cardiomediastinal silhouette unchanged in size and contour. Lower lung volumes compared to the prior with developing opacities at the lung bases partially obscuring the right heart border and the retrocardiac region. No pneumothorax or pleural effusion. Unchanged cardiac pacing device. IMPRESSION: Low lung volumes with reticulonodular opacities at the lung bases, potentially atelectasis/scarring and/or multifocal infection. Unchanged cardiac pacing device. Electronically Signed   By: Gilmer Mor D.O.   On: 03/10/2019 08:37     EKG: Independently reviewed.  Paced rhythm, QTC 501   Assessment/Plan Principal Problem:   C6 cervical fracture (HCC) Active Problems:   COPD (chronic obstructive pulmonary disease) (HCC)   CAD (coronary artery disease)   Hypertension   Type II diabetes mellitus with renal manifestations (HCC)   Depression   Chronic diastolic CHF (congestive heart failure) (HCC)   Acute metabolic encephalopathy   Fall   Tobacco abuse   Hypothyroidism   Fall and C6 cervical fracture [facility]): Neurosurgeon, Dr. Adriana Simas was consulted, recommended c-collar and outpatient follow-up.  -place on med-surg bed for obs -prn percocet for pain -pt/ot -[place] C- Collar.   CAD: no CP -on ASA, Crestor, fenofibrate  HLD: -Crestor, fenofibrate  COPD (chronic obstructive  pulmonary disease) (HCC): Continue bronchodilators  HTN:  -Continue home medications: Amlodipine, cozarr -hydralazine prn  Type II diabetes mellitus with renal manifestations (HCC): Last A1c 8.3, poorly controled. Patient is taking Januvia, Metformin at home -SSI  Depression: -Continue home medications, Celexa, benztropine, brexpiprazole  Chronic diastolic CHF (congestive heart failure) (HCC): 2D echo 03/29/2017 showed EF 60-65%.  Patient does not have leg edema CHF seem to be compensated. -Continue home Lasix.  Acute metabolic encephalopathy: Etiology is not clear.  CT head is negative for acute intracranial abnormalities.  Differential diagnosis include dementia, delirium, uncontrolled hypothyroidism -Check TSH, vitamin B12, RPR -Frequent neuro check  Tobacco abuse: -nicotine patch  Hypothyroidism: TSH was 39.96 on 05/10/2017 -Continue home Synthroid -Follow-up TSH level     DVT ppx: SQ Lovenox Code Status: Full code Family Communication: None at bed side.   Disposition Plan: to be determined Consults called: Neurosurgeon, Dr. Adriana Simas Admission status: Med-surg bed for obs    Date of Service 03/10/2019    Lorretta Harp [facility]   If 7PM-7AM, please contact night-coverage [email or web address] Password [facility] 03/10/2019, 6:36 PM

## 2019-03-10 NOTE — ED Notes (Signed)
Informed RN that pt has bed assigned and that per Charge Tiff she would put in for transport.

## 2019-03-10 NOTE — Consult Note (Signed)
Neurosurgery-New Consultation Evaluation 03/10/2019 Melanie F Keator [id number]  Identifying Statement: Melanie King is a 74 y.o. female from [place] [address] with fall and neck pain  Physician Requesting Consultation: Dr. Cyril Loosen, [facility]  History of Present Illness: Ms. Canter is here after a fall with a history of of some mild dementia.  She does have a pacemaker preventing a MRI but a CT scan of the neck was performed which did show concern for C6 fracture.  She states she is having some minimal neck pain.  She does not endorse any arm pain.  She does not endorse any numbness or weakness in her arms or legs.  Her baseline mobility is unknown at this time.  She is in a cervical collar.  Past Medical History:  Past Medical History:  Diagnosis Date  . Arthritis   . CHF (congestive heart failure) (HCC)   . Coronary arteriosclerosis   . DDD (degenerative disc disease), cervical   . DDD (degenerative disc disease), lumbar   . Depression   . Diabetes mellitus without complication (HCC)   . GERD (gastroesophageal reflux disease)   . Hyperlipidemia   . Hypertension   . Osteoarthritis   . Pacemaker   . Tremor     Social History: Social History   Socioeconomic History  . Marital status: Single    Spouse name: Not on file  . Number of children: Not on file  . Years of education: Not on file  . Highest education level: Not on file  Occupational History  . Not on file  Tobacco Use  . Smoking status: Current Every Day Smoker    Packs/day: 1.00    Years: 50.00    Pack years: 50.00    Types: Cigarettes  . Smokeless tobacco: Never Used  Substance and Sexual Activity  . Alcohol use: No  . Drug use: No  . Sexual activity: Never  Other Topics Concern  . Not on file  Social History Narrative  . Not on file   Social Determinants of Health   Financial Resource Strain:   . Difficulty of Paying Living Expenses: Not on file  Food Insecurity:   .  Worried About Programme researcher, broadcasting/film/video in the Last Year: Not on file  . Ran Out of Food in the Last Year: Not on file  Transportation Needs:   . Lack of Transportation (Medical): Not on file  . Lack of Transportation (Non-Medical): Not on file  Physical Activity:   . Days of Exercise per Week: Not on file  . Minutes of Exercise per Session: Not on file  Stress:   . Feeling of Stress : Not on file  Social Connections:   . Frequency of Communication with Friends and Family: Not on file  . Frequency of Social Gatherings with Friends and Family: Not on file  . Attends Religious Services: Not on file  . Active Member of Clubs or Organizations: Not on file  . Attends Banker Meetings: Not on file  . Marital Status: Not on file  Intimate Partner Violence:   . Fear of Current or Ex-Partner: Not on file  . Emotionally Abused: Not on file  . Physically Abused: Not on file  . Sexually Abused: Not on file    Family History: Family History  Problem Relation Age of Onset  . Breast cancer Maternal Aunt 40  . Cancer Mother        colon  . Aneurysm Father  Review of Systems:  Review of Systems - General ROS: Negative Psychological ROS: Negative Ophthalmic ROS: Negative ENT ROS: Negative Hematological and Lymphatic ROS: Negative  Endocrine ROS: Negative Respiratory ROS: Negative Cardiovascular ROS: Negative Gastrointestinal ROS: Negative Genito-Urinary ROS: Negative Musculoskeletal ROS: Positive for neck pain Neurological ROS: Negative for numbness or weakness Dermatological ROS: Negative  Physical Exam: BP (!) 119/105   Pulse 73   Temp 97.9 F (36.6 C) (Oral)   Resp 15   Ht 5\' 3"  (1.6 m)   Wt 68 kg   LMP  (LMP Unknown)   SpO2 100%   BMI 26.56 kg/m  Body mass index is 26.56 kg/m. Body surface area is 1.74 meters squared. General appearance: Alert, cooperative, in no acute distress Head: Normocephalic, atraumatic Eyes: Normal, EOM intact Oropharynx: Wearing  facemask Neck: Range of motion not tested, in collar Ext: No edema in LE bilaterally  Neurologic exam:  Mental status: alertness: alert, affect: normal Speech: fluent and clear Motor:strength symmetric 5/5 in bilateral upper and lower extremities in all motor groups Sensory: intact to light touch in all extremities Gait: Not tested  Laboratory: Results for orders placed or performed during the hospital encounter of 03/10/19  Comprehensive metabolic panel  Result Value Ref Range   Sodium 136 135 - 145 mmol/L   Potassium 4.0 3.5 - 5.1 mmol/L   Chloride 100 98 - 111 mmol/L   CO2 24 22 - 32 mmol/L   Glucose, Bld 227 (H) 70 - 99 mg/dL   BUN 30 (H) 8 - 23 mg/dL   Creatinine, Ser 05/08/19 (H) 0.44 - 1.00 mg/dL   Calcium 9.6 8.9 - 3.71 mg/dL   Total Protein 7.8 6.5 - 8.1 g/dL   Albumin 4.1 3.5 - 5.0 g/dL   AST 19 15 - 41 U/L   ALT 20 0 - 44 U/L   Alkaline Phosphatase 85 38 - 126 U/L   Total Bilirubin 0.4 0.3 - 1.2 mg/dL   GFR calc non Af Amer 34 (L) >60 mL/min   GFR calc Af Amer 40 (L) >60 mL/min   Anion gap 12 5 - 15  CBC  Result Value Ref Range   WBC 10.4 4.0 - 10.5 K/uL   RBC 4.25 3.87 - 5.11 MIL/uL   Hemoglobin 12.8 12.0 - 15.0 g/dL   HCT 06.2 69.4 - 85.4 %   MCV 91.8 80.0 - 100.0 fL   MCH 30.1 26.0 - 34.0 pg   MCHC 32.8 30.0 - 36.0 g/dL   RDW 62.7 03.5 - 00.9 %   Platelets 279 150 - 400 K/uL   nRBC 0.0 0.0 - 0.2 %   I personally reviewed labs  Imaging: CT cervical spine:1. Fracture along the anterior inferior endplate of C6 with minimal displacement. No soft tissue changes are present. This fracture may not be acute. MRI could be used for further evaluation and assessment of acuity or any soft tissue injury.   Impression/Plan:  Ms. [age] is here for evaluation of a presumed C6 fracture after a fall.  I did review the images and I agree with the radiologist that I cannot say this is an obvious acute fracture.  It does not appear to extend throughout the  whole body and there is no displacement.  Given the concern though, I would recommend placement in a cervical collar and follow-up as an outpatient.  Her neurologic exam is reassuring and she should have evaluation and discussion of falls prevention.  I would recommend an Adriana Simas or [address] collar and we do need cervical spine upright x-rays after she is in a collar for monitoring later.   1.  Diagnosis: Concern for C6 fracture  2.  Plan -Cervical collar at all times -Upright x-rays today -Follow-up in clinic in 3 to 4 weeks

## 2019-03-10 NOTE — ED Provider Notes (Signed)
[facility] Emergency Department Provider Note   ____________________________________________    I have reviewed the triage vital signs and the nursing notes.   HISTORY  Chief Complaint Altered Mental Status  History limited by altered mental status  HPI Melanie King is a 74 y.o. female with history as noted below who presents with reportedly worsening mental status as well as unwitnessed fall that occurred.  The patient does complain of neck pain but is unable to give further history.  Past Medical History:  Diagnosis Date  . Arthritis   . CHF (congestive heart failure) ([place])   . Coronary arteriosclerosis   . DDD (degenerative disc disease), cervical   . DDD (degenerative disc disease), lumbar   . Depression   . Diabetes mellitus without complication ([place])   . GERD (gastroesophageal reflux disease)   . Hyperlipidemia   . Hypertension   . Osteoarthritis   . Pacemaker   . Tremor     Patient Active Problem List   Diagnosis Date Noted  . Acute delirium 05/12/2017  . Seizure ([place]) 05/10/2017  . Elevated sedimentation rate 04/04/2017  . Elevated C-reactive protein (CRP) 04/04/2017  . Chest pain, unspecified 04/02/2017  . Bilateral lower extremity pain (Secondary Area of Pain) (R>L) 04/02/2017  . Chronic hip pain, bilateral  (Secondary Area of Pain) (R>L) 04/02/2017  . Chronic bilateral low back pain with bilateral sciatica [facility] Area of Pain) (R>L) 04/02/2017  . Chronic pain syndrome 04/02/2017  . Disorder of bone, unspecified 04/02/2017  . Other long term (current) drug therapy 04/02/2017  . Other specified health status 04/02/2017  . Long term current use of opiate analgesic 04/02/2017  . Hip pain, bilateral 12/06/2016  . Tremor 05/27/2016  . Trigger finger of left hand 02/16/2016  . Osteoarthritis of both hands 02/16/2016  . Angina pectoris ([place]) 01/17/2016  . COPD (chronic obstructive pulmonary disease) ([place]) 01/17/2016  .  CAD (coronary artery disease) 01/17/2016  . HLD (hyperlipidemia) 01/17/2016  . Hypertension 01/17/2016  . Sick sinus syndrome ([place]) 01/17/2016  . Chronic pain 01/17/2016  . Personal history of tobacco use, presenting hazards to health 11/16/2015  . Diabetes ([place]) 05/12/2013    Past Surgical History:  Procedure Laterality Date  . ABDOMINAL HYSTERECTOMY    . BREAST BIOPSY Right 1994   neg cyst removed  . CHOLECYSTECTOMY    . EYE SURGERY  1964, 1966, and 1967   bilateral  . FOOT SURGERY Right    cellulitis  . PACEMAKER INSERTION    . SPINE SURGERY     cyst removed; [facility]    Prior to Admission medications   Medication Sig Start Date End Date Taking? Authorizing Provider  acetaminophen (TYLENOL) 325 MG tablet Take 2 tablets (650 mg total) by mouth every 6 (six) hours as needed for mild pain (or Fever >/= 101). 05/21/17   Loletha Grayer, MD  albuterol (PROVENTIL HFA;VENTOLIN HFA) 108 (90 Base) MCG/ACT inhaler Inhale 2 puffs into the lungs every 6 (six) hours as needed for wheezing or shortness of breath.    [provider]  amLODipine (NORVASC) 10 MG tablet TAKE 1 TABLET BY MOUTH EVERY DAY 08/28/16   Kathrine Haddock, NP  docusate sodium (COLACE) 100 MG capsule TAKE 1 CAPSULE BY MOUTH EVERY DAY 01/02/17   Kathrine Haddock, NP  feeding supplement, ENSURE ENLIVE, (ENSURE ENLIVE) LIQD Take 237 mLs by mouth 2 (two) times daily between meals. 05/21/17   Loletha Grayer, MD  fluticasone (FLONASE) 50 MCG/ACT nasal spray  Place 1 spray into both nostrils daily. 04/14/16   Gabriel Cirri, NP  HYDROcodone-acetaminophen (NORCO) 5-325 MG tablet Take 1 tablet by mouth every 6 (six) hours as needed for moderate pain. 06/29/17   Irean Hong, MD  insulin aspart (NOVOLOG) 100 UNIT/ML injection Inject 4 Units into the skin 3 (three) times daily with meals. 05/21/17   Alford Highland, MD  insulin glargine (LANTUS) 100 UNIT/ML injection Inject 0.14 mLs (14 Units total) into the skin at bedtime.  05/21/17   Alford Highland, MD  levothyroxine (SYNTHROID) 50 MCG tablet Take 1 tablet (50 mcg total) by mouth daily. 05/21/17 05/21/18  Alford Highland, MD  magnesium oxide (MAG-OX) 400 (241.3 Mg) MG tablet Take 1 tablet (400 mg total) by mouth daily. 05/22/17   Alford Highland, MD  metoprolol tartrate (LOPRESSOR) 25 MG tablet Take 25 mg by mouth 2 (two) times daily.    [provider]  risperiDONE (RISPERDAL M-TABS) 0.5 MG disintegrating tablet Take 1 tablet (0.5 mg total) by mouth 2 (two) times daily. 05/21/17   Alford Highland, MD  traMADol (ULTRAM) 50 MG tablet Take 1 tablet (50 mg total) by mouth every 6 (six) hours as needed. 11/14/18 11/14/19  Emily Filbert, MD  traZODone (DESYREL) 50 MG tablet Take 1 tablet (50 mg total) by mouth at bedtime. 05/21/17   Alford Highland, MD  TRUETRACK TEST test strip USE TO CHECK BLOOD SUGAR LEVEL ONCE DAILY. DX CODE E11.65 05/02/17   Gabriel Cirri, NP  Stann Ore SHORT PEN NEEDLES 31G X 8 MM MISC  01/31/16   [provider]  Vitamin D, Ergocalciferol, (DRISDOL) 50000 units CAPS capsule TAKE ONE CAPSULE BY MOUTH MONTHLY 01/29/17   Olevia Perches P, DO     Allergies Alprazolam, Fentanyl, Meperidine, and Ranitidine hcl  Family History  Problem Relation Age of Onset  . Breast cancer Maternal Aunt 40  . Cancer Mother        colon  . Aneurysm Father     Social History Social History   Tobacco Use  . Smoking status: Current Every Day Smoker    Packs/day: 1.00    Years: 50.00    Pack years: 50.00    Types: Cigarettes  . Smokeless tobacco: Never Used  Substance Use Topics  . Alcohol use: No  . Drug use: No    Review of Systems limited by altered mental status   ENT: Neck pain    ____________________________________________   PHYSICAL EXAM:  VITAL SIGNS: ED Triage Vitals [03/10/19 0724]  Enc Vitals Group     BP (!) 134/55     Pulse Rate 70     Resp 19     Temp 97.9 F (36.6 C)     Temp Source Oral     SpO2  94 %     Weight 68 kg (149 lb 14.6 oz)     Height      Head Circumference      Peak Flow      Pain Score 8     Pain Loc      Pain Edu?      Excl. in GC?     Constitutional: Arousable but rapidly drifts off to sleep Eyes: Bilateral  eye discharge, noninfectious appearance Head: No hematoma or laceration Nose: No epistaxis Mouth/Throat: Mucous membranes are dry Neck: C-collar in place Cardiovascular: Normal rate, regular rhythm.  Good peripheral circulation. Respiratory: Normal respiratory effort.  No retractions.  Gastrointestinal: Soft and nontender. No distention.    Musculoskeletal: Patient  moves all extremities, no vertebral tenderness to palpation of the T or L-spine Neurologic: Unclear what patient's baseline is however she rapidly drifts off to sleep, she is arousable and can answer questions intermittently Skin:  Skin is warm, dry and intact. No rash noted. Psychiatric: Unable to examine  ____________________________________________   LABS (all labs ordered are listed, but only abnormal results are displayed)  Labs Reviewed  COMPREHENSIVE METABOLIC PANEL - Abnormal; Notable for the following components:      Result Value   Glucose, Bld 227 (*)    BUN 30 (*)    Creatinine, Ser 1.49 (*)    GFR calc non Af Amer 34 (*)    GFR calc Af Amer 40 (*)    All other components within normal limits  RESPIRATORY PANEL BY RT PCR (FLU A&B, COVID)  CBC  URINALYSIS, COMPLETE (UACMP) WITH MICROSCOPIC  CBG MONITORING, ED   ____________________________________________  EKG  ED ECG REPORT I, Jene Every, the attending physician, personally viewed and interpreted this ECG.  Date: 03/10/2019  Rhythm: Atrial paced rhythm QRS Axis: Abnormal Intervals: Abnormal ST/T Wave abnormalities: Nonspecific change Narrative Interpretation: no evidence of acute ischemia  ____________________________________________  RADIOLOGY  CT head cervical spine Contacted by radiologist and  notified of likely C6 endplate fracture ____________________________________________   PROCEDURES  Procedure(s) performed: No  Procedures   Critical Care performed: no   ____________________________________________   INITIAL IMPRESSION / ASSESSMENT AND PLAN / ED COURSE  Pertinent labs & imaging results that were available during my care of the patient were reviewed by me and considered in my medical decision making (see chart for details).  Minimal history available, reported unwitnessed fall with several weeks of mental decline/altered mental status.  Likely C6 endplate fracture on CT, radiology has requested MRI however the patient does have a pacemaker  Discussed with Dr. Adriana Simas of neurosurgery, request c-collar and he will see the patient in the ED.  Appreciate Dr. Patsey Berthold consultation, he recommends [facility] J or [place] collar, no surgical intervention necessary.  Patient reportedly lives at home with a roommate which is striking given that she is certainly not capable of taking care of herself.  Has had a rapid decline in mental status reportedly in the short-term.  Lab work thus far is unremarkable will require admission for further evaluation and work-up    ____________________________________________   FINAL CLINICAL IMPRESSION(S) / ED DIAGNOSES  Final diagnoses:  Altered mental status, unspecified altered mental status type  Closed nondisplaced fracture of sixth cervical vertebra, unspecified fracture morphology, initial encounter [facility])        Note:  This document was prepared using Dragon voice recognition software and may include unintentional dictation errors.   Jene Every, MD 03/10/19 1043

## 2019-03-10 NOTE — ED Notes (Signed)
With pt's permission spoke with Ludger Nutting friend and updated him on pt's status and plan of care at this time.

## 2019-03-10 NOTE — Progress Notes (Signed)
Orthopedic Tech Progress Note Patient Details:  Melanie King 11/04/1945 [id number]  Patient ID: Melanie F Mkrtchyan, female   DOB: 24-Aug-1945, 74 y.o.   MRN: [id number]   Saul Fordyce 03/10/2019, 5:56 PMCalled [facility] for aspen collar.

## 2019-03-10 NOTE — Evaluation (Signed)
Occupational Therapy Evaluation Patient Details Name: Melanie King MRN: [id number] DOB: [birth year] Today's Date: 03/10/2019    History of Present Illness 74yo female pt admitted under observation after fall at home and neck pain. PMHx includes arthritis, CHF, coronary arteriosclerosis, DDD (cervical, lumbar), depression, DM, GERD, HLD, HTN, OA, and pacemaker. Pt unable to obtain MRI due to pacemaker, but CT scan of neck concerning for C6 facture. Per neurosurgery, pt to be placed in cervical collar at all times and follow up in clinic in 3-4 weeks.   Clinical Impression   Pt seen for OT evaluation this date. No family/friends present to verify information provided by pt. Per pt, she was ambulating without AD prior to admission, requiring assist from friend Helene Kelp with ADL when Helene Kelp wasn't working (otherwise stated she "tried as hard I as I can to do it"). Pt reports "roommate" Roger stays with her often and assists with most cooking, some cleaning, and provides transportation when needed. Pt report she manages her own medications. Pt endorses 15+ falls in past 12 months. Initially, pt reports she lives in a 2 story home with her bed/bath on 2nd floor but then reports she lives in a 1st fl apartment with no steps to enter and walk in shower. Currently pt demo's decreased strength globally, decreased sensation to B hands up to wrists (new) and B feet (for past 3 months), and impaired balance (per pt's report). Pt declines OOB assessment but does not give clear reasoning for why. Agreeable to OOB next session. Pt will benefit from additional skilled OT services to maximize safety/independence and decrease risk for falls, caregiver burden, and readmission. Currently recommend SNF until further mobility/ADL assessment can be performed.      Follow Up Recommendations  SNF    Equipment Recommendations  3 in 1 bedside commode    Recommendations for Other Services       Precautions / Restrictions  Precautions Precautions: Fall Precaution Comments: cervical collar at all times Required Braces or Orthoses: Cervical Brace Cervical Brace: At all times;Hard collar      Mobility Bed Mobility               General bed mobility comments: pt declined without reason  Transfers                 General transfer comment: pt declined without reason    Balance                                           ADL either performed or assessed with clinical judgement   ADL Overall ADL's : Needs assistance/impaired Eating/Feeding: Set up   Grooming: Set up                                 General ADL Comments: Min A for UB ADL, Mod-Max A for LB ADL     Vision Patient Visual Report: No change from baseline       Perception     Praxis      Pertinent Vitals/Pain Pain Assessment: 0-10 Pain Score: 6  Pain Location: neck pain Pain Descriptors / Indicators: Aching Pain Intervention(s): Limited activity within patient's tolerance;Monitored during session;Repositioned;Patient requesting pain meds-RN notified     Hand Dominance Right   Extremity/Trunk Assessment Upper Extremity Assessment Upper Extremity Assessment: Generalized weakness(R  shoulder pain with flexion; pt reports bilat hand to wrist numbness)   Lower Extremity Assessment Lower Extremity Assessment: Generalized weakness(pt reports hx of bilat feet numbness for approx 3 months)   Cervical / Trunk Assessment Cervical / Trunk Assessment: Other exceptions Cervical / Trunk Exceptions: possible C6 fracture, to wear collar at all times   Communication Communication Communication: HOH   Cognition Arousal/Alertness: Awake/alert Behavior During Therapy: WFL for tasks assessed/performed Overall Cognitive Status: No family/caregiver present to determine baseline cognitive functioning                                 General Comments: pt alert and oriented x4, slight  delay in response time but also HOH so difficult to tease out, follows commands with cues   General Comments  cervical collar in place (tan in color, type not familiar to this therapist)    Exercises     Shoulder Instructions      Home Living Family/patient expects to be discharged to:: Private residence Living Arrangements: Alone Available Help at Discharge: Friend(s);Available PRN/intermittently Type of Home: Apartment                           Additional Comments: Pt questionable historian, no family/friends to confirm - reports living in 1st fl apt with no steps, walk in shower; does walk up to 2nd floor to visit friend "[name]" who also sometimes stays with her      Prior Functioning/Environment Level of Independence: Needs assistance  Gait / Transfers Assistance Needed: per pt, pt ambulating without AD but endorses poor balance and 15+ falls in past 12 months ADL's / Homemaking Assistance Needed: per pt, pt requires assist from friend for bathing, dressing, and toileting, but when friend Rosey Bath isn't there she does "try really hard" to do it herself; reports [name] cooks and does some cleaning and provides pt with transportation; pt reports managing medications on her own            OT Problem List: Decreased strength;Pain;Impaired sensation;Decreased cognition;Decreased knowledge of use of DME or AE;Impaired UE functional use;Impaired balance (sitting and/or standing)      OT Treatment/Interventions: Self-care/ADL training;Therapeutic exercise;Therapeutic activities;Cognitive remediation/compensation;Neuromuscular education;DME and/or AE instruction;Patient/family education;Balance training    OT Goals(Current goals can be found in the care plan section) Acute Rehab OT Goals Patient Stated Goal: go home OT Goal Formulation: With patient Time For Goal Achievement: 03/24/19 Potential to Achieve Goals: Good ADL Goals Pt Will Perform Upper Body Dressing: with min  assist;sitting Pt Will Perform Lower Body Dressing: with mod assist;sit to/from stand Pt Will Transfer to Toilet: with min assist;bedside commode;ambulating(LRAD for amb)  OT Frequency: Min 1X/week   Barriers to D/C: Decreased caregiver support          Co-evaluation              AM-PAC OT "6 Clicks" Daily Activity     Outcome Measure Help from another person eating meals?: None Help from another person taking care of personal grooming?: A Little Help from another person toileting, which includes using toliet, bedpan, or urinal?: A Lot Help from another person bathing (including washing, rinsing, drying)?: A Lot Help from another person to put on and taking off regular upper body clothing?: A Little Help from another person to put on and taking off regular lower body clothing?: A Lot 6 Click Score: 16   End  of Session Equipment Utilized During Treatment: Cervical collar Nurse Communication: Patient requests pain meds  Activity Tolerance: Patient tolerated treatment well Patient left: in bed;with call bell/phone within reach;with bed alarm set;with nursing/sitter in room  OT Visit Diagnosis: Other abnormalities of gait and mobility (R26.89);Repeated falls (R29.6);Muscle weakness (generalized) (M62.81);Pain;Other symptoms and signs involving the nervous system (R29.898) Pain - Right/Left: (neck)                Time: 0300-9233 OT Time Calculation (min): 21 min Charges:  OT General Charges $OT Visit: 1 Visit OT Evaluation $OT Eval Moderate Complexity: 1 Mod  Richrd Prime, MPH, MS, OTR/L ascom [phone] 03/10/19, 3:36 PM

## 2019-03-11 DIAGNOSIS — S12500A Unspecified displaced fracture of sixth cervical vertebra, initial encounter for closed fracture: Secondary | ICD-10-CM | POA: Diagnosis not present

## 2019-03-11 DIAGNOSIS — I2510 Atherosclerotic heart disease of native coronary artery without angina pectoris: Secondary | ICD-10-CM

## 2019-03-11 DIAGNOSIS — S12501A Unspecified nondisplaced fracture of sixth cervical vertebra, initial encounter for closed fracture: Secondary | ICD-10-CM | POA: Diagnosis not present

## 2019-03-11 DIAGNOSIS — I10 Essential (primary) hypertension: Secondary | ICD-10-CM | POA: Diagnosis not present

## 2019-03-11 LAB — HEMOGLOBIN A1C
Hgb A1c MFr Bld: 9.6 % — ABNORMAL HIGH (ref 4.8–5.6)
Mean Plasma Glucose: 229 mg/dL

## 2019-03-11 LAB — BASIC METABOLIC PANEL
Anion gap: 10 (ref 5–15)
BUN: 19 mg/dL (ref 8–23)
CO2: 24 mmol/L (ref 22–32)
Calcium: 9.4 mg/dL (ref 8.9–10.3)
Chloride: 103 mmol/L (ref 98–111)
Creatinine, Ser: 0.92 mg/dL (ref 0.44–1.00)
GFR calc Af Amer: >60 mL/min (ref >60)
GFR calc non Af Amer: >60 mL/min (ref >60)
Glucose, Bld: 234 mg/dL — ABNORMAL HIGH (ref 70–99)
Potassium: 3.9 mmol/L (ref 3.5–5.1)
Sodium: 137 mmol/L (ref 135–145)

## 2019-03-11 LAB — CBC
HCT: 37 % (ref 36.0–46.0)
Hemoglobin: 12.4 g/dL (ref 12.0–15.0)
MCH: 30.5 pg (ref 26.0–34.0)
MCHC: 33.5 g/dL (ref 30.0–36.0)
MCV: 91.1 fL (ref 80.0–100.0)
Platelets: 265 10*3/uL (ref 150–400)
RBC: 4.06 MIL/uL (ref 3.87–5.11)
RDW: 15.5 % (ref 11.5–15.5)
WBC: 8.8 10*3/uL (ref 4.0–10.5)
nRBC: 0 % (ref 0.0–0.2)

## 2019-03-11 LAB — RPR: RPR Ser Ql: NONREACTIVE

## 2019-03-11 LAB — GLUCOSE, CAPILLARY
Glucose-Capillary: 208 mg/dL — ABNORMAL HIGH (ref 70–99)
Glucose-Capillary: 217 mg/dL — ABNORMAL HIGH (ref 70–99)
Glucose-Capillary: 232 mg/dL — ABNORMAL HIGH (ref 70–99)
Glucose-Capillary: 328 mg/dL — ABNORMAL HIGH (ref 70–99)
Glucose-Capillary: 239 mg/dL — ABNORMAL HIGH (ref 70–99)
Glucose-Capillary: 248 mg/dL — ABNORMAL HIGH (ref 70–99)

## 2019-03-11 MED ORDER — INSULIN GLARGINE 100 UNIT/ML ~~LOC~~ SOLN
10.0000 [IU] | Freq: Every day | SUBCUTANEOUS | Status: DC
  Administered 2019-03-11: 10 [IU] via SUBCUTANEOUS
  Filled 2019-03-11 (×2): qty 0.1

## 2019-03-11 NOTE — Progress Notes (Signed)
D: Pt alert and oriented, however does have some confusion at times and is very hard of hearing. Pt has had to be reminded multiple times today that they were to be wearing their neck brace at all times. Pt has been found w/o neck brace on multiple times. Pt becomes very frustrated with the impediments caused by pt's neck brace. Pt denies experiencing any pain at this time. Pt has also attempted to get out of chair and bed without assistance a few times today with disregard to ones own safety. Pt is tearful r/t situation and impediments caused by brace. Pt's affect/mood observed as tearful/anxious.  A: Scheduled medications administered to pt, per MD orders. Support and encouragement provided. Frequent verbal contact made.  R: No adverse drug reactions noted. Pt verbally contracts for safety at this time. Pt complaint with medications. Pt interacts well with staff on the unit. Pt remains safe at this time, will continue to monitor and provide care for as ordered.

## 2019-03-11 NOTE — Progress Notes (Signed)
PROGRESS NOTE    Melanie King  YDX:[id number] DOB: 04-16-1945 DOA: 03/10/2019 PCP: Patient, No Pcp Per      Assessment & Plan:   Principal Problem:   C6 cervical fracture (HCC) Active Problems:   COPD (chronic obstructive pulmonary disease) (HCC)   CAD (coronary artery disease)   Hypertension   Type II diabetes mellitus with renal manifestations (HCC)   Depression   Chronic diastolic CHF (congestive heart failure) (HCC)   Acute metabolic encephalopathy   Fall   Tobacco abuse   Hypothyroidism   C6 cervical fracture: secondary to a fall. Continue w/ c-collar & will need outpatient f/u as per Dr. Adriana Simas, neuro surgery. Percocet prn for pain. PT recommending home health PT, OT is recommending SNF. CM is aware   CAD: no CP. Continue on aspirin, statin, fenofibrate  HLD: continue on crestor, fenofibrate  COPD: w/o exacerbation. Continue bronchodilators  HTN:  Continue on home dose of amlodipine, cozarr. Hydralazine prn  DM2: Last A1c 8.3, poorly controled. Continue to hold home dose of Januvia, Metformin. Continue on SSI w/ accuchecks   Depression: severity unknown. Continue home dose of celexa, benztropine, brexpiprazole  Chronic diastolic CHF: 2D echo 03/29/2017 showed EF 60-65%. Not fluid overloaded. Continue on home dose of lasix.  Acute metabolic encephalopathy: etiology unclear. Likely back to baseline, etiology unclear. CT head is negative for acute intracranial abnormalities.  Differential diagnosis include dementia, delirium, uncontrolled hypothyroidism. Continue w/ neuro checks  Tobacco abuse: nicotine patch to prevent w/drawal. Smoking cessation counseling   Hypothyroidism: continue home Synthroid    DVT prophylaxis: lovenox Code Status: full  Family Communication:  Disposition Plan: likely today or tomorrow, depending on if pt goes home w/ HH vs SNF   Consultants:   Neurosurg   Procedures: n/a   Antimicrobials:  n/a   Subjective: Pt  c/o generalized weakness  Objective: Vitals:   03/10/19 1319 03/10/19 1608 03/10/19 1920 03/11/19 0028  BP: (!) 177/76 (!) 175/78 140/78 (!) 163/69  Pulse: 70 70  70  Resp: 14 15  19   Temp: 97.8 F (36.6 C) 98 F (36.7 C)  97.6 F (36.4 C)  TempSrc: Oral Oral  Axillary  SpO2: 100% 99%  99%  Weight:      Height:        Intake/Output Summary (Last 24 hours) at 03/11/2019 0730 Last data filed at 03/11/2019 0600 Gross per 24 hour  Intake 1121.55 ml  Output --  Net 1121.55 ml   Filed Weights   03/10/19 0724 03/10/19 0843  Weight: 68 kg 68 kg    Examination:  General exam: Appears calm and comfortable. C-collar in place  Respiratory system: Clear to auscultation. Respiratory effort normal. Cardiovascular system: S1 & S2 +. No rubs, gallops or clicks.  Gastrointestinal system: Abdomen is nondistended, soft and nontender. Normal bowel sounds heard. Central nervous system: Alert and oriented. Moves all 4 extremities  Psychiatry: Judgement and insight appear normal. Flat mood and affect     Data Reviewed: I have personally reviewed following labs and imaging studies  CBC: Recent Labs  Lab 03/10/19 0730 03/11/19 0635  WBC 10.4 8.8  HGB 12.8 12.4  HCT 39.0 37.0  MCV 91.8 91.1  PLT 279 265   Basic Metabolic Panel: Recent Labs  Lab 03/10/19 0730 03/11/19 0635  NA 136 137  K 4.0 3.9  CL 100 103  CO2 24 24  GLUCOSE 227* 234*  BUN 30* 19  CREATININE 1.49* 0.92  CALCIUM 9.6 9.4   GFR:  Estimated Creatinine Clearance: 50.4 mL/min (by C-G formula based on SCr of 0.92 mg/dL). Liver Function Tests: Recent Labs  Lab 03/10/19 0730  AST 19  ALT 20  ALKPHOS 85  BILITOT 0.4  PROT 7.8  ALBUMIN 4.1   No results for input(s): LIPASE, AMYLASE in the last 168 hours. No results for input(s): AMMONIA in the last 168 hours. Coagulation Profile: No results for input(s): INR, PROTIME in the last 168 hours. Cardiac Enzymes: No results for input(s): CKTOTAL, CKMB, CKMBINDEX,  TROPONINI in the last 168 hours. BNP (last 3 results) No results for input(s): PROBNP in the last 8760 hours. HbA1C: Recent Labs    03/10/19 1343  HGBA1C 9.6*   CBG: Recent Labs  Lab 03/10/19 1321 03/10/19 1658 03/10/19 2159 03/11/19 0130  GLUCAP 223* 245* 202* 217*   Lipid Profile: No results for input(s): CHOL, HDL, LDLCALC, TRIG, CHOLHDL, LDLDIRECT in the last 72 hours. Thyroid Function Tests: Recent Labs    03/10/19 0730  TSH 83.944*   Anemia Panel: Recent Labs    03/10/19 1343  VITAMINB12 3,363*   Sepsis Labs: No results for input(s): PROCALCITON, LATICACIDVEN in the last 168 hours.  Recent Results (from the past 240 hour(s))  Respiratory Panel by RT PCR (Flu A&B, Covid) - Nasopharyngeal Swab     Status: None   Collection Time: 03/10/19 10:34 AM   Specimen: Nasopharyngeal Swab  Result Value Ref Range Status   SARS Coronavirus 2 by RT PCR NEGATIVE NEGATIVE Final    Comment: (NOTE) SARS-CoV-2 target nucleic acids are NOT DETECTED. The SARS-CoV-2 RNA is generally detectable in upper respiratoy specimens during the acute phase of infection. The lowest concentration of SARS-CoV-2 viral copies this assay can detect is 131 copies/mL. A negative result does not preclude SARS-Cov-2 infection and should not be used as the sole basis for treatment or other patient management decisions. A negative result may occur with  improper specimen collection/handling, submission of specimen other than nasopharyngeal swab, presence of viral mutation(s) within the areas targeted by this assay, and inadequate number of viral copies (<131 copies/mL). A negative result must be combined with clinical observations, patient history, and epidemiological information. The expected result is Negative. Fact Sheet for Patients:  PinkCheek.be Fact Sheet for Healthcare Providers:  GravelBags.it This test is not yet ap proved or  cleared by the [place] FDA and  has been authorized for detection and/or diagnosis of SARS-CoV-2 by FDA under an Emergency Use Authorization (EUA). This EUA will remain  in effect (meaning this test can be used) for the duration of the COVID-19 declaration under Section 564(b)(1) of the Act, 21 U.S.C. section 360bbb-3(b)(1), unless the authorization is terminated or revoked sooner.    Influenza A by PCR NEGATIVE NEGATIVE Final   Influenza B by PCR NEGATIVE NEGATIVE Final    Comment: (NOTE) The Xpert Xpress SARS-CoV-2/FLU/RSV assay is intended as an aid in  the diagnosis of influenza from Nasopharyngeal swab specimens and  should not be used as a sole basis for treatment. Nasal washings and  aspirates are unacceptable for Xpert Xpress SARS-CoV-2/FLU/RSV  testing. Fact Sheet for Patients: PinkCheek.be Fact Sheet for Healthcare Providers: GravelBags.it This test is not yet approved or cleared by the [place] FDA and  has been authorized for detection and/or diagnosis of SARS-CoV-2 by  FDA under an Emergency Use Authorization (EUA). This EUA will remain  in effect (meaning this test can be used) for the duration of the  Covid-19 declaration under Section 564(b)(1) of the  Act, 21  U.S.C. section 360bbb-3(b)(1), unless the authorization is  terminated or revoked. Performed at [facility], [address], [place], [place] [address]          Radiology Studies: CT Head Wo Contrast  Result Date: 03/10/2019 CLINICAL DATA:  HEAD TRAUMA. MODERATE TO SEVERE. ALTERED MENTAL STATUS. EXAM: CT HEAD WITHOUT CONTRAST CT CERVICAL SPINE WITHOUT CONTRAST TECHNIQUE: Multidetector CT imaging of the head and cervical spine was performed following the standard protocol without intravenous contrast. Multiplanar CT image reconstructions of the cervical spine were also generated. COMPARISON:  CT head without contrast 05/10/2017  FINDINGS: CT HEAD FINDINGS Brain: Mild atrophy and white matter changes are stable. A remote lacunar infarct is again seen in the right lentiform nucleus. No acute infarct, hemorrhage, or mass lesion is present. The ventricles are proportionate to the degree of atrophy. No significant extraaxial fluid collection is present. The brainstem and cerebellum are within normal limits. Vascular: Atherosclerotic calcifications are present within the cavernous internal carotid arteries bilaterally. Calcifications present at the dural margin of both vertebral arteries. No hyperdense vessel is present. Skull: Calvarium is intact. No focal lytic or blastic lesions are present. No significant extracranial soft tissue lesion is present. Sinuses/Orbits: The paranasal sinuses and mastoid air cells are clear. A left lens replacement is present. Globes and orbits are otherwise within normal limits. CT CERVICAL SPINE FINDINGS Alignment: No significant listhesis is present. There is straightening and slight reversal of the normal cervical lordosis. Mild rightward curvature is present in the upper cervical spine. Skull base and vertebrae: Craniocervical junction is normal. Vertebral body heights are within normal limits. A fracture is present anteriorly on the left along the inferior endplate of C6. This predominantly involves the anterior endplate spur although there is portion of the vertebral body. No associated soft tissue edema or hematoma is evident. No other fractures are present. Soft tissues and spinal canal: No prevertebral fluid or swelling. No visible canal hematoma. Atherosclerotic calcifications are present at the carotid bifurcations bilaterally. Luminal narrowing is worse left than right. Disc levels: Multilevel degenerative changes are present. Central and foraminal narrowing is greatest at C3-4 due to a broad-based disc osteophyte complex and bilateral facet hypertrophy. Facet spurring is greatest at C4-5, left greater  than right. There is ankylosis across the disc space at C4-5. Upper chest: Lung apices are clear. Thoracic inlet is within normal limits. IMPRESSION: 1. Fracture along the anterior inferior endplate of C6 with minimal displacement. No soft tissue changes are present. This fracture may not be acute. MRI could be used for further evaluation and assessment of acuity or any soft tissue injury. 2. No acute intracranial abnormality or significant interval change. 3. Stable atrophy and white matter disease. 4. Stable remote lacunar infarct of the right lentiform nucleus. 5. Multilevel degenerative changes of the cervical spine as described. 6. Atherosclerosis. These results were called by telephone at the time of interpretation on 03/10/2019 at 8:08 am to provider Jene Every , who verbally acknowledged these results. Electronically Signed   By: Marin Roberts M.D.   On: 03/10/2019 08:08   CT Cervical Spine Wo Contrast  Result Date: 03/10/2019 CLINICAL DATA:  HEAD TRAUMA. MODERATE TO SEVERE. ALTERED MENTAL STATUS. EXAM: CT HEAD WITHOUT CONTRAST CT CERVICAL SPINE WITHOUT CONTRAST TECHNIQUE: Multidetector CT imaging of the head and cervical spine was performed following the standard protocol without intravenous contrast. Multiplanar CT image reconstructions of the cervical spine were also generated. COMPARISON:  CT head without contrast 05/10/2017 FINDINGS: CT  HEAD FINDINGS Brain: Mild atrophy and white matter changes are stable. A remote lacunar infarct is again seen in the right lentiform nucleus. No acute infarct, hemorrhage, or mass lesion is present. The ventricles are proportionate to the degree of atrophy. No significant extraaxial fluid collection is present. The brainstem and cerebellum are within normal limits. Vascular: Atherosclerotic calcifications are present within the cavernous internal carotid arteries bilaterally. Calcifications present at the dural margin of both vertebral arteries. No hyperdense  vessel is present. Skull: Calvarium is intact. No focal lytic or blastic lesions are present. No significant extracranial soft tissue lesion is present. Sinuses/Orbits: The paranasal sinuses and mastoid air cells are clear. A left lens replacement is present. Globes and orbits are otherwise within normal limits. CT CERVICAL SPINE FINDINGS Alignment: No significant listhesis is present. There is straightening and slight reversal of the normal cervical lordosis. Mild rightward curvature is present in the upper cervical spine. Skull base and vertebrae: Craniocervical junction is normal. Vertebral body heights are within normal limits. A fracture is present anteriorly on the left along the inferior endplate of C6. This predominantly involves the anterior endplate spur although there is portion of the vertebral body. No associated soft tissue edema or hematoma is evident. No other fractures are present. Soft tissues and spinal canal: No prevertebral fluid or swelling. No visible canal hematoma. Atherosclerotic calcifications are present at the carotid bifurcations bilaterally. Luminal narrowing is worse left than right. Disc levels: Multilevel degenerative changes are present. Central and foraminal narrowing is greatest at C3-4 due to a broad-based disc osteophyte complex and bilateral facet hypertrophy. Facet spurring is greatest at C4-5, left greater than right. There is ankylosis across the disc space at C4-5. Upper chest: Lung apices are clear. Thoracic inlet is within normal limits. IMPRESSION: 1. Fracture along the anterior inferior endplate of C6 with minimal displacement. No soft tissue changes are present. This fracture may not be acute. MRI could be used for further evaluation and assessment of acuity or any soft tissue injury. 2. No acute intracranial abnormality or significant interval change. 3. Stable atrophy and white matter disease. 4. Stable remote lacunar infarct of the right lentiform nucleus. 5.  Multilevel degenerative changes of the cervical spine as described. 6. Atherosclerosis. These results were called by telephone at the time of interpretation on 03/10/2019 at 8:08 am to provider Jene Every , who verbally acknowledged these results. Electronically Signed   By: Marin Roberts M.D.   On: 03/10/2019 08:08   DG Chest Port 1 View  Result Date: 03/10/2019 CLINICAL DATA:  74 year old female with a history of altered mental status EXAM: PORTABLE CHEST 1 VIEW COMPARISON:  01/12/2019 FINDINGS: Cardiomediastinal silhouette unchanged in size and contour. Lower lung volumes compared to the prior with developing opacities at the lung bases partially obscuring the right heart border and the retrocardiac region. No pneumothorax or pleural effusion. Unchanged cardiac pacing device. IMPRESSION: Low lung volumes with reticulonodular opacities at the lung bases, potentially atelectasis/scarring and/or multifocal infection. Unchanged cardiac pacing device. Electronically Signed   By: Gilmer Mor D.O.   On: 03/10/2019 08:37        Scheduled Meds: . amLODipine  10 mg Oral Daily  . aspirin EC  81 mg Oral Daily  . benztropine  0.5 mg Oral BID  . brexpiprazole  1 mg Oral QHS  . calcitRIOL  0.25 mcg Oral Daily  . citalopram  20 mg Oral Daily  . enoxaparin (LOVENOX) injection  40 mg Subcutaneous Q24H  . famotidine  20 mg Oral Daily  . fenofibrate  54 mg Oral Daily  . gabapentin  100 mg Oral BID  . gabapentin  300 mg Oral QHS  . influenza vaccine adjuvanted  0.5 mL Intramuscular Tomorrow-1000  . insulin aspart  0-5 Units Subcutaneous QHS  . insulin aspart  0-9 Units Subcutaneous TID WC  . levothyroxine  75 mcg Oral QAC breakfast  . losartan  50 mg Oral Daily  . metoprolol tartrate  25 mg Oral BID  . montelukast  10 mg Oral QHS  . nicotine  21 mg Transdermal Daily  . pneumococcal 23 valent vaccine  0.5 mL Intramuscular Tomorrow-1000  . primidone  50 mg Oral BID  . rosuvastatin  40 mg Oral  QHS  . vitamin B-12  1,000 mcg Oral Daily   Continuous Infusions: . sodium chloride 75 mL/hr at 03/11/19 0600     LOS: 0 days    Time spent: 30 mins    Charise Killian, MD [facility] Pager [phone]  If 7PM-7AM, please contact night-coverage [email or web address] Password [email or web address] 03/11/2019, 7:30 AM

## 2019-03-11 NOTE — Progress Notes (Signed)
Occupational Therapy Treatment Patient Details Name: Melanie King MRN: [id number] DOB: April 04, 1945 Today's Date: 03/11/2019    History of present illness 74yo female pt admitted under observation after fall at home and neck pain. PMHx includes arthritis, CHF, coronary arteriosclerosis, DDD (cervical, lumbar), depression, DM, GERD, HLD, HTN, OA, and pacemaker. Pt unable to obtain MRI due to pacemaker, but CT scan of neck concerning for C6 facture. Per neurosurgery, pt to be placed in cervical collar at all times and follow up in clinic in 3-4 weeks.   OT comments  Pt seen for OT tx this date for f/u regarding safety with self care ADLs and ADL mobility. Pt sleeping in recliner when OT presents. Extended time required facilitating safe positioning to lower foot rest and scoot pt's bottom back in chair for better anchoring in prep for CTS. Pt requires MIN A with FWW to CTS from chair as well as cues for safe hand placement with use of FWW. Pt demos understanding and adjusts hand placement, but forgets in standing and takes hands from grippers, requiring cues to remind her for safety. Pt performs fxl mobility from chair to EOB (5-6 shuffling/pivoting steps to her left) with CGA. Once back in bed, pt requires MIN A for more fine motor UB self care grooming tasks d/t some slight UE tremors noted. Pt appears to be close to baseline. Recommended d/c dispo upgraded to reflect pt's described social support-states she has friends that can alternate staying and supervising days to weeks following hospitalization for pt safety.    Follow Up Recommendations  Home health OT;Supervision/Assistance - 24 hour(supv for all OOB-standing ADLs, ADL transfers, fxl mobility)    Equipment Recommendations  3 in 1 bedside commode    Recommendations for Other Services      Precautions / Restrictions Precautions Precautions: Fall;Cervical Precaution Booklet Issued: No Precaution Comments: cervical collar at all  times Required Braces or Orthoses: Cervical Brace Cervical Brace: At all times;Hard collar Restrictions Weight Bearing Restrictions: No       Mobility Bed Mobility Overal bed mobility: Needs Assistance Bed Mobility: Sit to Supine     Supine to sit: Min assist        Transfers Overall transfer level: Needs assistance Equipment used: Rolling walker (2 wheeled) Transfers: Sit to/from Stand           General transfer comment: Pt requires MOD verbal/tactile cues for safe hand placement to control stand to sit from RW to EOB by reaching back as well as to push up from arm rests to come to stand versus pulling up from walker.    Balance Overall balance assessment: Needs assistance;History of Falls Sitting-balance support: Feet supported;Bilateral upper extremity supported Sitting balance-Leahy Scale: Good     Standing balance support: Bilateral upper extremity supported Standing balance-Leahy Scale: Fair Standing balance comment: use of RW and CGA for safety                           ADL either performed or assessed with clinical judgement   ADL       Grooming: Wash/dry face;Minimal assistance;Sitting Grooming Details (indicate cue type and reason): Setup to wash face, but to apply lip balm, pt requires MIN A-potentially some decreased proprioception, or potentially difficult d/t some UE tremors.                             Functional mobility during ADLs:  Min guard;Rolling walker(constant tactile and verbal cues to keep walker with her while pivoting and to keep hands on hand grips of RW.)       Vision Patient Visual Report: No change from baseline     Perception     Praxis      Cognition Arousal/Alertness: Awake/alert Behavior During Therapy: WFL for tasks assessed/performed Overall Cognitive Status: Within Functional Limits for tasks assessed                                 General Comments: pt A&O, but with some  decreased safety awareness noted during fxl mobility, potentially related to [facility], decreased ability to hear safety cues?        Exercises Other Exercises Other Exercises: OT facilitates education with pt re: aspen collar wear-when OOB even if at home until otherwise instructed by MD. Pt needs reinforcement. Other Exercises: OT facilitates education with pt re: safe hand placement with RW for sit to stand to sit. Pt demos moderate reception of teaching, but requires reinforcement.   Shoulder Instructions       General Comments      Pertinent Vitals/ Pain       Pain Assessment: Faces Faces Pain Scale: Hurts a little bit Pain Location: slight neck pain with sit to stand and repositioning in bed Pain Descriptors / Indicators: Aching Pain Intervention(s): Monitored during session;Repositioned  Home Living                                          Prior Functioning/Environment              Frequency  Min 1X/week        Progress Toward Goals  OT Goals(current goals can now be found in the care plan section)  Progress towards OT goals: Progressing toward goals  Acute Rehab OT Goals Patient Stated Goal: go home OT Goal Formulation: With patient Time For Goal Achievement: 03/24/19 Potential to Achieve Goals: Good  Plan Discharge plan needs to be updated;Frequency remains appropriate    Co-evaluation                 AM-PAC OT "6 Clicks" Daily Activity     Outcome Measure   Help from another person eating meals?: None Help from another person taking care of personal grooming?: A Little Help from another person toileting, which includes using toliet, bedpan, or urinal?: A Lot Help from another person bathing (including washing, rinsing, drying)?: A Lot Help from another person to put on and taking off regular upper body clothing?: A Little Help from another person to put on and taking off regular lower body clothing?: A Little 6 Click Score:  17    End of Session Equipment Utilized During Treatment: Gait belt;Rolling walker;Cervical collar  OT Visit Diagnosis: Other abnormalities of gait and mobility (R26.89);Repeated falls (R29.6);Muscle weakness (generalized) (M62.81);Pain;Other symptoms and signs involving the nervous system (R29.898)   Activity Tolerance Patient tolerated treatment well   Patient Left in bed;with call bell/phone within reach;with bed alarm set;Other (comment)(with second lunch tray ordered as pt slept through her lunch meal tray arriving and has not eaten at time of tx.)   Nurse Communication          Time: 4696-2952 OT Time Calculation (min): 49 min  Charges: OT General Charges $OT  Visit: 1 Visit OT Treatments $Self Care/Home Management : 8-22 mins $Therapeutic Activity: 23-37 mins  Rejeana Brock, MS, OTR/L ascom [phone] 03/11/19, 3:50 PM

## 2019-03-11 NOTE — TOC Progression Note (Signed)
Transition of Care [facility]) - Progression Note    Patient Details  Name: [place] F Reiber MRN: [id number] Date of Birth: [birth year]  Transition of Care [facility]) CM/SW Contact  Barrie Dunker, RN Phone Number: 03/11/2019, 4:10 PM  Clinical Narrative:    Barbara Cower with [facility] has accepted the patient    Expected Discharge Plan: Home w Home Health Services Barriers to Discharge: Barriers Resolved  Expected Discharge Plan and Services Expected Discharge Plan: Home w Home Health Services   Discharge Planning Services: CM Consult   Living arrangements for the past 2 months: Apartment                 DME Arranged: Dan Humphreys rolling DME Agency: [employer] Date DME Agency Contacted: 03/11/19 Time DME Agency Contacted: [phone] Representative spoke with at DME Agency: Nida Boatman HH Arranged: PT, Nurse's Aide HH Agency: [facility] Date [facility] Agency Contacted: 03/11/19 Time HH Agency Contacted: [phone] Representative spoke with at [facility] Agency: Cassie   Social Determinants of Health (SDOH) Interventions    Readmission Risk Interventions No flowsheet data found.

## 2019-03-11 NOTE — Evaluation (Signed)
Physical Therapy Evaluation Patient Details Name: [place] F Matusek MRN: [id number] DOB: December 08, 1945 Today's Date: 03/11/2019   History of Present Illness  74yo female pt admitted under observation after fall at home and neck pain. PMHx includes arthritis, CHF, coronary arteriosclerosis, DDD (cervical, lumbar), depression, DM, GERD, HLD, HTN, OA, and pacemaker. Pt unable to obtain MRI due to pacemaker, but CT scan of neck concerning for C6 facture. Per neurosurgery, pt to be placed in cervical collar at all times and follow up in clinic in 3-4 weeks.  Clinical Impression  Pt is a pleasant 74 year old female who was admitted for fall with C6 fx with nonsurgical approach. Pt performs bed mobility with min assist, transfers with cga, and ambulation with cga and RW. Cues for safe RW use and fall prevention including reaching back for chair prior to sitting down.  Pt demonstrates deficits with strength/balance. Is more independent transferring from seated position, would need more help from getting in/out of bed. Would benefit from skilled PT to address above deficits and promote optimal return to PLOF. Recommend transition to HHPT upon discharge from acute hospitalization.     Follow Up Recommendations Home health PT(initial 24/7 with assist for OOB)    Equipment Recommendations  Rolling walker with 5" wheels    Recommendations for Other Services       Precautions / Restrictions Precautions Precautions: Fall;Cervical Precaution Booklet Issued: No Precaution Comments: cervical collar at all times Required Braces or Orthoses: Cervical Brace Cervical Brace: At all times;Hard collar Restrictions Weight Bearing Restrictions: No      Mobility  Bed Mobility Overal bed mobility: Needs Assistance Bed Mobility: Supine to Sit     Supine to sit: Min assist     General bed mobility comments: needs assist for sliding B LEs off bed and trunk support. Cues for neck precautions during transfer. Once  seated at EOB, able to scoot towards EOB with supervision and maintain upright posture.  Transfers Overall transfer level: Needs assistance Equipment used: Rolling walker (2 wheeled) Transfers: Sit to/from Stand Sit to Stand: Min guard         General transfer comment: needs constant cues for pushing from seated surface. Once standing, upright posture noted. No dizziness or LOB  Ambulation/Gait Ambulation/Gait assistance: Min guard Gait Distance (Feet): 85 Feet Assistive device: Rolling walker (2 wheeled) Gait Pattern/deviations: Step-through pattern     General Gait Details: 1 seated rest break needed. Multiple laps in room as pt has difficulty masking for hallway use. Pt cued for safe technique using RW including turning and staying inside the RW. IMproves with repitition and cues. No SOB, O2 and HR WNL. 1 seated rest break due to fatigue  Stairs            Wheelchair Mobility    Modified Rankin (Stroke Patients Only)       Balance Overall balance assessment: Needs assistance;History of Falls Sitting-balance support: Feet supported;Bilateral upper extremity supported Sitting balance-Leahy Scale: Good     Standing balance support: Bilateral upper extremity supported Standing balance-Leahy Scale: Good Standing balance comment: with use of RW                             Pertinent Vitals/Pain Pain Assessment: Faces Faces Pain Scale: Hurts a little bit Pain Location: neck pain and ankle pain Pain Descriptors / Indicators: Aching Pain Intervention(s): Limited activity within patient's tolerance;Premedicated before session    Home Living Family/patient expects to  be discharged to:: Private residence Living Arrangements: Alone Available Help at Discharge: Friend(s);Available PRN/intermittently Type of Home: Apartment Home Access: Stairs to enter Entrance Stairs-Rails: Can reach both Entrance Stairs-Number of Steps: 1 Home Layout: One level Home  Equipment: None Additional Comments: Pt is poor historian and very HOH. Reports she has several friends who assist at home, but they work. SHe is hopeful to have increased home care    Prior Function Level of Independence: Needs assistance   Gait / Transfers Assistance Needed: per pt, pt ambulating without AD but endorses poor balance and 15+ falls in past 12 months  ADL's / Homemaking Assistance Needed: per pt, pt requires assist from friend for bathing, dressing, and toileting, but when friend Rosey Bath isn't there she does "try really hard" to do it herself; reports [employer] and does some cleaning and provides pt with transportation; pt reports managing medications on her own        Hand Dominance        Extremity/Trunk Assessment   Upper Extremity Assessment Upper Extremity Assessment: Generalized weakness(B UE grossly 3/5)    Lower Extremity Assessment Lower Extremity Assessment: Generalized weakness(B LE grossly 3+/5)    Cervical / Trunk Assessment Cervical / Trunk Exceptions: possible C6 fracture, to wear collar at all times  Communication   Communication: HOH  Cognition Arousal/Alertness: Awake/alert Behavior During Therapy: WFL for tasks assessed/performed Overall Cognitive Status: Within Functional Limits for tasks assessed                                 General Comments: A&O x 4      General Comments      Exercises Other Exercises Other Exercises: supine ther-ex performed on B LE including AP, hip abd/add, and SLRs. ALl ther-ex performed x 10 reps with supervision   Assessment/Plan    PT Assessment Patient needs continued PT services  PT Problem List Decreased strength;Decreased balance;Decreased mobility;Decreased knowledge of use of DME;Decreased safety awareness;Pain       PT Treatment Interventions DME instruction;Gait training;Stair training;Therapeutic activities;Therapeutic exercise;Balance training    PT Goals (Current goals can  be found in the Care Plan section)  Acute Rehab PT Goals Patient Stated Goal: go home PT Goal Formulation: With patient Time For Goal Achievement: 03/25/19 Potential to Achieve Goals: Good    Frequency 7X/week   Barriers to discharge        Co-evaluation               AM-PAC PT "6 Clicks" Mobility  Outcome Measure Help needed turning from your back to your side while in a flat bed without using bedrails?: A Lot Help needed moving from lying on your back to sitting on the side of a flat bed without using bedrails?: A Lot Help needed moving to and from a bed to a chair (including a wheelchair)?: A Little Help needed standing up from a chair using your arms (e.g., wheelchair or bedside chair)?: A Little Help needed to walk in hospital room?: A Little Help needed climbing 3-5 steps with a railing? : A Little 6 Click Score: 16    End of Session Equipment Utilized During Treatment: Gait belt Activity Tolerance: Patient tolerated treatment well Patient left: in chair;with chair alarm set Nurse Communication: Mobility status PT Visit Diagnosis: Repeated falls (R29.6);Muscle weakness (generalized) (M62.81);History of falling (Z91.81);Difficulty in walking, not elsewhere classified (R26.2);Pain Pain - Right/Left: (bilat) Pain - part of body:  Ankle and joints of foot    Time: 8003-4917 PT Time Calculation (min) (ACUTE ONLY): 26 min   Charges:   PT Evaluation $PT Eval Low Complexity: 1 Low PT Treatments $Therapeutic Exercise: 8-22 mins        [name], PT, DPT [phone]   Janiya Millirons 03/11/2019, 10:25 AM

## 2019-03-11 NOTE — Progress Notes (Signed)
Inpatient Diabetes Program Recommendations  AACE/ADA: New Consensus Statement on Inpatient Glycemic Control (2015)  Target Ranges:  Prepandial:   less than 140 mg/dL      Peak postprandial:   less than 180 mg/dL (1-2 hours)      Critically ill patients:  140 - 180 mg/dL   Lab Results  Component Value Date   GLUCAP 328 (H) 03/11/2019   HGBA1C 9.6 (H) 03/10/2019    Review of Glycemic Control Results for [place], [place] F (MRN [id number]) as of 03/11/2019 12:02  Ref. Range 03/10/2019 20:39 03/10/2019 21:59 03/11/2019 01:30 03/11/2019 08:00 03/11/2019 11:40  Glucose-Capillary Latest Ref Range: 70 - 99 mg/dL 496 (H) 759 (H) 163 (H) 232 (H) 328 (H)     Diabetes history: DM2 Outpatient Diabetes medications: Metformin 1000 BID; Januvia 50 mg Daily Current orders for Inpatient glycemic control: Novolog 0-9 TID with meals and 0-5 QHS  Inpatient Diabetes Program Recommendations:     -Lantus 10 daily (0.15/kg)  Will plan to see patient today A1C 9.4  Thank you, Zerita Boers, RN, BSN Diabetes Coordinator Inpatient Diabetes Program [phone] (team pager from 8a-5p)

## 2019-03-12 DIAGNOSIS — S12500A Unspecified displaced fracture of sixth cervical vertebra, initial encounter for closed fracture: Secondary | ICD-10-CM | POA: Diagnosis not present

## 2019-03-12 DIAGNOSIS — S12501D Unspecified nondisplaced fracture of sixth cervical vertebra, subsequent encounter for fracture with routine healing: Secondary | ICD-10-CM

## 2019-03-12 DIAGNOSIS — G9341 Metabolic encephalopathy: Secondary | ICD-10-CM | POA: Diagnosis not present

## 2019-03-12 DIAGNOSIS — I10 Essential (primary) hypertension: Secondary | ICD-10-CM | POA: Diagnosis not present

## 2019-03-12 LAB — CBC
HCT: 36.7 % (ref 36.0–46.0)
Hemoglobin: 12 g/dL (ref 12.0–15.0)
MCH: 30.1 pg (ref 26.0–34.0)
MCHC: 32.7 g/dL (ref 30.0–36.0)
MCV: 92 fL (ref 80.0–100.0)
Platelets: 237 10*3/uL (ref 150–400)
RBC: 3.99 MIL/uL (ref 3.87–5.11)
RDW: 15.4 % (ref 11.5–15.5)
WBC: 8.9 10*3/uL (ref 4.0–10.5)
nRBC: 0 % (ref 0.0–0.2)

## 2019-03-12 LAB — BASIC METABOLIC PANEL
Anion gap: 8 (ref 5–15)
BUN: 14 mg/dL (ref 8–23)
CO2: 25 mmol/L (ref 22–32)
Calcium: 9.1 mg/dL (ref 8.9–10.3)
Chloride: 105 mmol/L (ref 98–111)
Creatinine, Ser: 1.05 mg/dL — ABNORMAL HIGH (ref 0.44–1.00)
GFR calc Af Amer: >60 mL/min (ref >60)
GFR calc non Af Amer: 53 mL/min — ABNORMAL LOW (ref >60)
Glucose, Bld: 188 mg/dL — ABNORMAL HIGH (ref 70–99)
Potassium: 3.7 mmol/L (ref 3.5–5.1)
Sodium: 138 mmol/L (ref 135–145)

## 2019-03-12 LAB — GLUCOSE, CAPILLARY
Glucose-Capillary: 209 mg/dL — ABNORMAL HIGH (ref 70–99)
Glucose-Capillary: 300 mg/dL — ABNORMAL HIGH (ref 70–99)
Glucose-Capillary: 307 mg/dL — ABNORMAL HIGH (ref 70–99)

## 2019-03-12 MED ORDER — OXYCODONE-ACETAMINOPHEN 5-325 MG PO TABS: 1.0000 | ORAL_TABLET | Freq: Four times a day (QID) | ORAL | 0 refills | Status: DC | PRN

## 2019-03-12 MED ORDER — NICOTINE 21 MG/24HR TD PT24: 21.0000 mg | MEDICATED_PATCH | Freq: Every day | TRANSDERMAL | 0 refills | Status: DC

## 2019-03-12 MED ORDER — LEVOTHYROXINE SODIUM 100 MCG PO TABS: 100.0000 ug | ORAL_TABLET | Freq: Every day | ORAL | 2 refills | Status: DC

## 2019-03-12 MED ORDER — LEVOTHYROXINE SODIUM 100 MCG PO TABS: 100.0000 ug | ORAL_TABLET | Freq: Every day | ORAL | Status: DC

## 2019-03-12 NOTE — Discharge Summary (Signed)
Physician Discharge Summary  [place] F Eaker EAV:[id number] DOB: 04-10-1945 DOA: 03/10/2019  PCP: Patient, No Pcp Per  Admit date: 03/10/2019 Discharge date: 03/12/2019  Admitted From: Home Disposition: Home  Recommendations for Outpatient Follow-up:  1. Follow-up with PCP in 2 weeks.  Please check TSH in 6 weeks (Synthroid dose was increased) 2. Follow-up with neurosurgery Dr. Adriana Simas in 2 weeks  Home Health: RN, PT, OT and home health aide Equipment/Devices: Cervical collar, rolling walker, bedside commode  Discharge Condition: Fair CODE STATUS: Full code Diet recommendation: Heart Healthy / Carb Modified    Discharge Diagnoses:  Principal Problem:   C6 cervical fracture (HCC)  Active Problems:   COPD (chronic obstructive pulmonary disease) (HCC)   CAD (coronary artery disease)   Hypertension   Type II diabetes mellitus with renal manifestations (HCC)   Depression   Chronic diastolic CHF (congestive heart failure) (HCC)   Acute metabolic encephalopathy   Fall   Tobacco abuse   Hypothyroidism  Brief narrative/HPI 74 year old female with history of poorly controlled type 2 diabetes mellitus, chronic diastolic CHF, COPD, CAD, hyperlipidemia, ongoing tobacco use presented with fall at home with undisplaced C6 cervical fracture.  Neurosurgeon was consulted who recommended to continue with cervical collar and outpatient follow-up.  Hospital course  Principal problem C6 cervical fracture Secondary to mechanical fall.  Neurosurgeon Dr. Adriana Simas was consulted in the ED who recommended cervical collar and outpatient follow-up.  Seen by PT and recommend home health PT, OT.  Provided equipments. Pain control with as needed Percocet.  Active problems Acute metabolic encephalopathy On presentation.  No clear etiology.  Head CT unremarkable.  B12 high (on supplement).  TSH markedly elevated at 83.  No signs or symptoms of myxedema.  I have increased her Synthroid dose.  UA negative for  infection.  Mental status now improved to baseline.  Coronary artery disease Continue statin, aspirin and fibrate  COPD Stable.  Continue home inhalers.  Counseled strongly on tobacco cessation.  Nicotine patch prescribed  Uncontrolled type 2 diabetes mellitus with hyperglycemia A1c of 8.3.  Counseled on diet and medication adherence.  Resume home meds  Chronic diastolic CHF Euvolemic.  Continue home Lasix   Disposition: Home Consult: Dr. Adriana Simas (neurosurgeon) Family communication: Discussed with friend at bedside  Discharge Instructions   Allergies as of 03/12/2019      Reactions   Alprazolam Swelling   Fentanyl Other (See Comments)   "burning and hot"   Meperidine Itching   Other reaction(s): Other (See Comments)   Ranitidine Hcl    Other reaction(s): Other (See Comments)      Medication List    TAKE these medications   amLODipine 10 MG tablet Commonly known as: NORVASC TAKE 1 TABLET BY MOUTH EVERY DAY   aspirin EC 81 MG tablet Take 81 mg by mouth daily.   benztropine 0.5 MG tablet Commonly known as: COGENTIN Take 0.5 mg by mouth 2 (two) times daily.   calcitRIOL 0.25 MCG capsule Commonly known as: ROCALTROL Take 0.25 mcg by mouth daily.   citalopram 20 MG tablet Commonly known as: CELEXA Take 20 mg by mouth daily.   famotidine 40 MG tablet Commonly known as: PEPCID Take 40 mg by mouth daily.   fenofibrate 48 MG tablet Commonly known as: TRICOR Take 48 mg by mouth daily.   furosemide 20 MG tablet Commonly known as: LASIX Take 20 mg by mouth daily.   gabapentin 100 MG capsule Commonly known as: NEURONTIN Take 100-300 mg by mouth See  admin instructions. Take 100 mg in the morning, and at noon, take 300 mg at bedtime   levothyroxine 100 MCG tablet Commonly known as: SYNTHROID Take 1 tablet (100 mcg total) by mouth daily before breakfast. What changed:   medication strength  how much to take   losartan 50 MG tablet Commonly known as:  COZAAR Take 50 mg by mouth daily.   meloxicam 15 MG tablet Commonly known as: MOBIC Take 15 mg by mouth daily.   metFORMIN 1000 MG tablet Commonly known as: GLUCOPHAGE Take 1,000 mg by mouth 2 (two) times daily with a meal.   metoprolol tartrate 25 MG tablet Commonly known as: LOPRESSOR Take 25 mg by mouth 2 (two) times daily.   montelukast 10 MG tablet Commonly known as: SINGULAIR Take 10 mg by mouth at bedtime.   nicotine 21 mg/24hr patch Commonly known as: NICODERM CQ - dosed in mg/24 hours Place 1 patch (21 mg total) onto the skin daily. Start taking on: March 13, 2019   nitroGLYCERIN 0.2 mg/hr patch Commonly known as: NITRODUR - Dosed in mg/24 hr Place 0.2 mg onto the skin daily. leave patch on 12-14 hours, then remove for 10-12 hours prior to applying the next patch   oxyCODONE-acetaminophen 5-325 MG tablet Commonly known as: PERCOCET/ROXICET Take 1 tablet by mouth every 6 (six) hours as needed for moderate pain.   primidone 50 MG tablet Commonly known as: MYSOLINE Take 50 mg by mouth 2 (two) times daily.   Rexulti 1 MG Tabs tablet Generic drug: brexpiprazole Take 1 mg by mouth at bedtime.   rosuvastatin 40 MG tablet Commonly known as: CRESTOR Take 40 mg by mouth at bedtime.   sitaGLIPtin 50 MG tablet Commonly known as: JANUVIA Take 50 mg by mouth daily.   traMADol 50 MG tablet Commonly known as: Ultram Take 1 tablet (50 mg total) by mouth every 6 (six) hours as needed. What changed: when to take this   vitamin B-12 1000 MCG tablet Commonly known as: CYANOCOBALAMIN Take 1,000 mcg by mouth daily.            Durable Medical Equipment  (From admission, onward)         Start     Ordered   03/12/19 1118  For home use only DME Walker rolling  Once    Question Answer Comment  Walker: With 5 Inch Wheels   Patient needs a walker to treat with the following condition C6 cervical fracture (HCC)      03/12/19 1117   03/12/19 1118  For home use only  DME Bedside commode  Once    Question:  Patient needs a bedside commode to treat with the following condition  Answer:  Generalized weakness   03/12/19 1117         Follow-up Information    has appt with PCP in 1 month Follow up.        Lucy Chris, MD. Schedule an appointment as soon as possible for a visit in 2 week(s).   Specialty: Neurosurgery Contact information: [address] [place] [address] [phone]          Allergies  Allergen Reactions  . Alprazolam Swelling  . Fentanyl Other (See Comments)    "burning and hot"  . Meperidine Itching    Other reaction(s): Other (See Comments)  . Ranitidine Hcl     Other reaction(s): Other (See Comments)      Procedures/Studies: CT Head Wo Contrast  Result Date: 03/10/2019 CLINICAL DATA:  HEAD TRAUMA. MODERATE TO SEVERE. ALTERED MENTAL STATUS. EXAM: CT HEAD WITHOUT CONTRAST CT CERVICAL SPINE WITHOUT CONTRAST TECHNIQUE: Multidetector CT imaging of the head and cervical spine was performed following the standard protocol without intravenous contrast. Multiplanar CT image reconstructions of the cervical spine were also generated. COMPARISON:  CT head without contrast 05/10/2017 FINDINGS: CT HEAD FINDINGS Brain: Mild atrophy and white matter changes are stable. A remote lacunar infarct is again seen in the right lentiform nucleus. No acute infarct, hemorrhage, or mass lesion is present. The ventricles are proportionate to the degree of atrophy. No significant extraaxial fluid collection is present. The brainstem and cerebellum are within normal limits. Vascular: Atherosclerotic calcifications are present within the cavernous internal carotid arteries bilaterally. Calcifications present at the dural margin of both vertebral arteries. No hyperdense vessel is present. Skull: Calvarium is intact. No focal lytic or blastic lesions are present. No significant extracranial soft tissue lesion is present. Sinuses/Orbits: The paranasal  sinuses and mastoid air cells are clear. A left lens replacement is present. Globes and orbits are otherwise within normal limits. CT CERVICAL SPINE FINDINGS Alignment: No significant listhesis is present. There is straightening and slight reversal of the normal cervical lordosis. Mild rightward curvature is present in the upper cervical spine. Skull base and vertebrae: Craniocervical junction is normal. Vertebral body heights are within normal limits. A fracture is present anteriorly on the left along the inferior endplate of C6. This predominantly involves the anterior endplate spur although there is portion of the vertebral body. No associated soft tissue edema or hematoma is evident. No other fractures are present. Soft tissues and spinal canal: No prevertebral fluid or swelling. No visible canal hematoma. Atherosclerotic calcifications are present at the carotid bifurcations bilaterally. Luminal narrowing is worse left than right. Disc levels: Multilevel degenerative changes are present. Central and foraminal narrowing is greatest at C3-4 due to a broad-based disc osteophyte complex and bilateral facet hypertrophy. Facet spurring is greatest at C4-5, left greater than right. There is ankylosis across the disc space at C4-5. Upper chest: Lung apices are clear. Thoracic inlet is within normal limits. IMPRESSION: 1. Fracture along the anterior inferior endplate of C6 with minimal displacement. No soft tissue changes are present. This fracture may not be acute. MRI could be used for further evaluation and assessment of acuity or any soft tissue injury. 2. No acute intracranial abnormality or significant interval change. 3. Stable atrophy and white matter disease. 4. Stable remote lacunar infarct of the right lentiform nucleus. 5. Multilevel degenerative changes of the cervical spine as described. 6. Atherosclerosis. These results were called by telephone at the time of interpretation on 03/10/2019 at 8:08 am to  provider Jene Every , who verbally acknowledged these results. Electronically Signed   By: Marin Roberts M.D.   On: 03/10/2019 08:08   CT Cervical Spine Wo Contrast  Result Date: 03/10/2019 CLINICAL DATA:  HEAD TRAUMA. MODERATE TO SEVERE. ALTERED MENTAL STATUS. EXAM: CT HEAD WITHOUT CONTRAST CT CERVICAL SPINE WITHOUT CONTRAST TECHNIQUE: Multidetector CT imaging of the head and cervical spine was performed following the standard protocol without intravenous contrast. Multiplanar CT image reconstructions of the cervical spine were also generated. COMPARISON:  CT head without contrast 05/10/2017 FINDINGS: CT HEAD FINDINGS Brain: Mild atrophy and white matter changes are stable. A remote lacunar infarct is again seen in the right lentiform nucleus. No acute infarct, hemorrhage, or mass lesion is present. The ventricles are proportionate to the degree of atrophy. No significant extraaxial fluid collection is present.  The brainstem and cerebellum are within normal limits. Vascular: Atherosclerotic calcifications are present within the cavernous internal carotid arteries bilaterally. Calcifications present at the dural margin of both vertebral arteries. No hyperdense vessel is present. Skull: Calvarium is intact. No focal lytic or blastic lesions are present. No significant extracranial soft tissue lesion is present. Sinuses/Orbits: The paranasal sinuses and mastoid air cells are clear. A left lens replacement is present. Globes and orbits are otherwise within normal limits. CT CERVICAL SPINE FINDINGS Alignment: No significant listhesis is present. There is straightening and slight reversal of the normal cervical lordosis. Mild rightward curvature is present in the upper cervical spine. Skull base and vertebrae: Craniocervical junction is normal. Vertebral body heights are within normal limits. A fracture is present anteriorly on the left along the inferior endplate of C6. This predominantly involves the  anterior endplate spur although there is portion of the vertebral body. No associated soft tissue edema or hematoma is evident. No other fractures are present. Soft tissues and spinal canal: No prevertebral fluid or swelling. No visible canal hematoma. Atherosclerotic calcifications are present at the carotid bifurcations bilaterally. Luminal narrowing is worse left than right. Disc levels: Multilevel degenerative changes are present. Central and foraminal narrowing is greatest at C3-4 due to a broad-based disc osteophyte complex and bilateral facet hypertrophy. Facet spurring is greatest at C4-5, left greater than right. There is ankylosis across the disc space at C4-5. Upper chest: Lung apices are clear. Thoracic inlet is within normal limits. IMPRESSION: 1. Fracture along the anterior inferior endplate of C6 with minimal displacement. No soft tissue changes are present. This fracture may not be acute. MRI could be used for further evaluation and assessment of acuity or any soft tissue injury. 2. No acute intracranial abnormality or significant interval change. 3. Stable atrophy and white matter disease. 4. Stable remote lacunar infarct of the right lentiform nucleus. 5. Multilevel degenerative changes of the cervical spine as described. 6. Atherosclerosis. These results were called by telephone at the time of interpretation on 03/10/2019 at 8:08 am to provider Lavonia Drafts , who verbally acknowledged these results. Electronically Signed   By: San Morelle M.D.   On: 03/10/2019 08:08   DG Chest Port 1 View  Result Date: 03/10/2019 CLINICAL DATA:  74 year old female with a history of altered mental status EXAM: PORTABLE CHEST 1 VIEW COMPARISON:  01/12/2019 FINDINGS: Cardiomediastinal silhouette unchanged in size and contour. Lower lung volumes compared to the prior with developing opacities at the lung bases partially obscuring the right heart border and the retrocardiac region. No pneumothorax or pleural  effusion. Unchanged cardiac pacing device. IMPRESSION: Low lung volumes with reticulonodular opacities at the lung bases, potentially atelectasis/scarring and/or multifocal infection. Unchanged cardiac pacing device. Electronically Signed   By: Corrie Mckusick D.O.   On: 03/10/2019 08:37       Subjective: Feels better.  Mild neck pain.  No overnight events.  Discharge Exam: Vitals:   03/12/19 0737 03/12/19 0944  BP: (!) 149/59 (!) 174/57  Pulse: 70 70  Resp: 17   Temp: 98 F (36.7 C)   SpO2: 96% 98%   Vitals:   03/11/19 2031 03/12/19 0013 03/12/19 0737 03/12/19 0944  BP: (!) 176/64 (!) 142/63 (!) 149/59 (!) 174/57  Pulse: 69 70 70 70  Resp: 19 18 17    Temp: 98.1 F (36.7 C) 98.2 F (36.8 C) 98 F (36.7 C)   TempSrc: Oral Axillary Oral   SpO2: 96% 98% 96% 98%  Weight:  Height:        General: Not in distress HEENT: Cervical collar in place.  Moist mucosa, supple neck Chest: Clear bilaterally CVs: Normal S1-S2 GI: Soft, nondistended, nontender Musculoskeletal: Warm, no edema     The results of significant diagnostics from this hospitalization (including imaging, microbiology, ancillary and laboratory) are listed below for reference.     Microbiology: Recent Results (from the past 240 hour(s))  Respiratory Panel by RT PCR (Flu A&B, Covid) - Nasopharyngeal Swab     Status: None   Collection Time: 03/10/19 10:34 AM   Specimen: Nasopharyngeal Swab  Result Value Ref Range Status   SARS Coronavirus 2 by RT PCR NEGATIVE NEGATIVE Final    Comment: (NOTE) SARS-CoV-2 target nucleic acids are NOT DETECTED. The SARS-CoV-2 RNA is generally detectable in upper respiratoy specimens during the acute phase of infection. The lowest concentration of SARS-CoV-2 viral copies this assay can detect is 131 copies/mL. A negative result does not preclude SARS-Cov-2 infection and should not be used as the sole basis for treatment or other patient management decisions. A negative  result may occur with  improper specimen collection/handling, submission of specimen other than nasopharyngeal swab, presence of viral mutation(s) within the areas targeted by this assay, and inadequate number of viral copies (<131 copies/mL). A negative result must be combined with clinical observations, patient history, and epidemiological information. The expected result is Negative. Fact Sheet for Patients:  https://www.moore.com/ Fact Sheet for Healthcare Providers:  https://www.young.biz/ This test is not yet ap proved or cleared by the [place] FDA and  has been authorized for detection and/or diagnosis of SARS-CoV-2 by FDA under an Emergency Use Authorization (EUA). This EUA will remain  in effect (meaning this test can be used) for the duration of the COVID-19 declaration under Section 564(b)(1) of the Act, 21 U.S.C. section 360bbb-3(b)(1), unless the authorization is terminated or revoked sooner.    Influenza A by PCR NEGATIVE NEGATIVE Final   Influenza B by PCR NEGATIVE NEGATIVE Final    Comment: (NOTE) The Xpert Xpress SARS-CoV-2/FLU/RSV assay is intended as an aid in  the diagnosis of influenza from Nasopharyngeal swab specimens and  should not be used as a sole basis for treatment. Nasal washings and  aspirates are unacceptable for Xpert Xpress SARS-CoV-2/FLU/RSV  testing. Fact Sheet for Patients: https://www.moore.com/ Fact Sheet for Healthcare Providers: https://www.young.biz/ This test is not yet approved or cleared by the [place] FDA and  has been authorized for detection and/or diagnosis of SARS-CoV-2 by  FDA under an Emergency Use Authorization (EUA). This EUA will remain  in effect (meaning this test can be used) for the duration of the  Covid-19 declaration under Section 564(b)(1) of the Act, 21  U.S.C. section 360bbb-3(b)(1), unless the authorization is  terminated or  revoked. Performed at [facility], [address] [place], [place] [address]      Labs: BNP (last 3 results) Recent Labs    03/10/19 1343  BNP 156.0*   Basic Metabolic Panel: Recent Labs  Lab 03/10/19 0730 03/11/19 0635 03/12/19 0451  NA 136 137 138  K 4.0 3.9 3.7  CL 100 103 105  CO2 24 24 25   GLUCOSE 227* 234* 188*  BUN 30* 19 14  CREATININE 1.49* 0.92 1.05*  CALCIUM 9.6 9.4 9.1   Liver Function Tests: Recent Labs  Lab 03/10/19 0730  AST 19  ALT 20  ALKPHOS 85  BILITOT 0.4  PROT 7.8  ALBUMIN 4.1   No results for input(s):  LIPASE, AMYLASE in the last 168 hours. No results for input(s): AMMONIA in the last 168 hours. CBC: Recent Labs  Lab 03/10/19 0730 03/11/19 0635 03/12/19 0451  WBC 10.4 8.8 8.9  HGB 12.8 12.4 12.0  HCT 39.0 37.0 36.7  MCV 91.8 91.1 92.0  PLT 279 265 237   Cardiac Enzymes: No results for input(s): CKTOTAL, CKMB, CKMBINDEX, TROPONINI in the last 168 hours. BNP: Invalid input(s): POCBNP CBG: Recent Labs  Lab 03/11/19 0800 03/11/19 1140 03/11/19 1718 03/11/19 2102 03/12/19 0843  GLUCAP 232* 328* 239* 248* 209*   D-Dimer No results for input(s): DDIMER in the last 72 hours. Hgb A1c Recent Labs    03/10/19 1343  HGBA1C 9.6*   Lipid Profile No results for input(s): CHOL, HDL, LDLCALC, TRIG, CHOLHDL, LDLDIRECT in the last 72 hours. Thyroid function studies Recent Labs    03/10/19 0730  TSH 83.944*   Anemia work up Recent Labs    03/10/19 1343  VITAMINB12 3,363*   Urinalysis    Component Value Date/Time   COLORURINE YELLOW (A) 03/10/2019 1739   APPEARANCEUR CLEAR (A) 03/10/2019 1739   APPEARANCEUR Clear 03/24/2013 1530   LABSPEC 1.015 03/10/2019 1739   LABSPEC 1.009 03/24/2013 1530   PHURINE 5.0 03/10/2019 1739   GLUCOSEU NEGATIVE 03/10/2019 1739   GLUCOSEU 50 mg/dL 13/24/4010 2725   HGBUR NEGATIVE 03/10/2019 1739   BILIRUBINUR NEGATIVE 03/10/2019 1739   BILIRUBINUR Negative 03/24/2013 1530    KETONESUR NEGATIVE 03/10/2019 1739   PROTEINUR 100 (A) 03/10/2019 1739   NITRITE NEGATIVE 03/10/2019 1739   LEUKOCYTESUR NEGATIVE 03/10/2019 1739   LEUKOCYTESUR Negative 03/24/2013 1530   Sepsis Labs Invalid input(s): PROCALCITONIN,  WBC,  LACTICIDVEN Microbiology Recent Results (from the past 240 hour(s))  Respiratory Panel by RT PCR (Flu A&B, Covid) - Nasopharyngeal Swab     Status: None   Collection Time: 03/10/19 10:34 AM   Specimen: Nasopharyngeal Swab  Result Value Ref Range Status   SARS Coronavirus 2 by RT PCR NEGATIVE NEGATIVE Final    Comment: (NOTE) SARS-CoV-2 target nucleic acids are NOT DETECTED. The SARS-CoV-2 RNA is generally detectable in upper respiratoy specimens during the acute phase of infection. The lowest concentration of SARS-CoV-2 viral copies this assay can detect is 131 copies/mL. A negative result does not preclude SARS-Cov-2 infection and should not be used as the sole basis for treatment or other patient management decisions. A negative result may occur with  improper specimen collection/handling, submission of specimen other than nasopharyngeal swab, presence of viral mutation(s) within the areas targeted by this assay, and inadequate number of viral copies (<131 copies/mL). A negative result must be combined with clinical observations, patient history, and epidemiological information. The expected result is Negative. Fact Sheet for Patients:  https://www.moore.com/ Fact Sheet for Healthcare Providers:  https://www.young.biz/ This test is not yet ap proved or cleared by the [place] FDA and  has been authorized for detection and/or diagnosis of SARS-CoV-2 by FDA under an Emergency Use Authorization (EUA). This EUA will remain  in effect (meaning this test can be used) for the duration of the COVID-19 declaration under Section 564(b)(1) of the Act, 21 U.S.C. section 360bbb-3(b)(1), unless the authorization  is terminated or revoked sooner.    Influenza A by PCR NEGATIVE NEGATIVE Final   Influenza B by PCR NEGATIVE NEGATIVE Final    Comment: (NOTE) The Xpert Xpress SARS-CoV-2/FLU/RSV assay is intended as an aid in  the diagnosis of influenza from Nasopharyngeal swab specimens and  should not be used  as a sole basis for treatment. Nasal washings and  aspirates are unacceptable for Xpert Xpress SARS-CoV-2/FLU/RSV  testing. Fact Sheet for Patients: https://www.moore.com/ Fact Sheet for Healthcare Providers: https://www.young.biz/ This test is not yet approved or cleared by the [place] FDA and  has been authorized for detection and/or diagnosis of SARS-CoV-2 by  FDA under an Emergency Use Authorization (EUA). This EUA will remain  in effect (meaning this test can be used) for the duration of the  Covid-19 declaration under Section 564(b)(1) of the Act, 21  U.S.C. section 360bbb-3(b)(1), unless the authorization is  terminated or revoked. Performed at [facility], [address], [place], [place] [address]      Time coordinating discharge: 25 minutes  SIGNED:   Eddie North, MD  [facility] 03/12/2019, 11:23 AM Pager   If 7PM-7AM, please contact night-coverage [email or web address] Password [email or web address]

## 2019-03-12 NOTE — TOC Transition Note (Signed)
Transition of Care [facility]) - CM/SW Discharge Note   Patient Details  Name: Melanie King MRN: [id number] Date of Birth: 05-26-1945  [facility] CM/SW Contact:  Barrie Dunker, RN Phone Number: 03/12/2019, 11:43 AM   Clinical Narrative:     Patient set up with [facility], BSC provided by [employer]. S/O to provide transportation, no additional needs  Final next level of care: Home w Home Health Services Barriers to Discharge: Barriers Resolved   Patient Goals and CMS Choice        Discharge Placement                       Discharge Plan and Services   Discharge Planning Services: CM Consult            DME Arranged: Walker rolling DME Agency: [employer] Date DME Agency Contacted: 03/11/19 Time DME Agency Contacted: 1242 Representative spoke with at DME Agency: Nida Boatman HH Arranged: PT, Nurse's Aide HH Agency: [facility] Date HH Agency Contacted: 03/11/19 Time HH Agency Contacted: 1611 Representative spoke with at [facility] Agency: Barbara Cower  Social Determinants of Health (SDOH) Interventions     Readmission Risk Interventions No flowsheet data found.

## 2019-03-12 NOTE — Discharge Instructions (Signed)
Cervical Spine Fracture, Stable  A cervical spine fracture is a break or crack in one of the bones of the neck. If there is a very low risk of problems happening during healing, the fracture is considered stable. What are the causes? This condition may be caused by:  Motor vehicle accidents.  Injuries from sports such as diving, football, biking, wrestling, or skiing.  Severe osteoporosis or other bone diseases, such as cancers that spread to bone or metabolic abnormalities that cause bone weakness. What are the signs or symptoms? Symptoms of this condition include:  Severe neck pain after an accident or fall. Pain may spread down the shoulders or arms.  Bruising or swelling on the back of the neck.  Numbness, tingling, sudden muscle tightening (spasms), or weakness in the arms, legs, or both. How is this diagnosed? This condition may be diagnosed based on:  Your medical history.  A physical exam of your neck, arms, and legs.  Imaging studies of the neck, such as: ? X-rays. ? CT scan. ? MRI. How is this treated? This condition is treated with a neck brace or cervical collar to keep your neck from moving during the healing process. A cervical collar is a device that supports your chin and the back of your head. You may also be given medicine to help relieve pain. Follow these instructions at home: If you have a neck brace:  Wear the brace as told by your health care provider. Remove it only as told by your health care provider.  If you experience numbness or tingling, loosen the brace.  Keep the brace clean.  If the brace is not waterproof: ? Do not let it get wet. ? Cover it with a watertight covering when you take a bath or a shower. If you have a cervical collar:  Do not remove the collar unless your health care provider tells you to do this. If you are allowed to remove the collar for cleaning and bathing: ? Follow your health care provider's instructions about how  to safely take off the collar. ? Wash and thoroughly dry the skin on your neck. Check your skin for irritation or sores. If you see any, tell your health care provider.  Ask your health care provider before making any adjustments to your collar. Small adjustments may be needed over time to improve comfort and reduce pressure on your chin or on the back of your head.  Keep long hair outside of the collar.  Keep your collar clean by wiping it with mild soap and water and letting it air-dry completely. The pads can be hand-washed with soap and water and air-dried completely. Managing pain, stiffness, and swelling   If directed, put ice on the injured area: ? If you have a removable brace or cervical collar, remove it as told by your health care provider. ? Put ice in a plastic bag. ? Place a towel between your skin and the bag. ? Leave the ice on for 20 minutes, 2-3 times a day. Activity  Do not drive a car until your health care provider approves.  Do not drive or use heavy machinery while taking prescription pain medicine.  Avoid physical activity for as long as directed. Ask your health care provider what activities are safe for you. General instructions  Take over-the-counter and prescription medicines only as told by your health care provider.  Do not take baths, swim, or use a hot tub until your health care provider approves. Ask your   health care provider if you can take showers. You may only be allowed to take sponge baths for bathing.  Do not use any products that contain nicotine or tobacco, such as cigarettes and e-cigarettes. These can delay bone healing. If you need help quitting, ask your health care provider.  Keep all follow-up visits as told by your health care provider. This is important to help prevent long-term (chronic) or permanent injury, pain, and disability. You may need to have follow-up X-rays or MRI 1-3 weeks after your injury. Contact a health care provider  if:  You have irritation or sores on your skin from your brace or cervical collar. Get help right away if:  You have neck pain that gets worse.  You develop difficulties swallowing or breathing.  You develop swelling in your neck.  You have any of the following problems in your arms, legs, or both: ? Numbness. ? Weakness. ? Burning pain. ? Movement problems.  You are unable to control when you urinate or have a bowel movement (incontinence).  You have problems with coordination or difficulty walking. This information is not intended to replace advice given to you by your health care provider. Make sure you discuss any questions you have with your health care provider. Document Revised: 02/02/2017 Document Reviewed: 11/25/2015 Elsevier Patient Education  2020 [employer]  

## 2019-03-12 NOTE — Progress Notes (Signed)
Pt ready for discharge home today per MD. Patient assessment unchanged from this morning. Discharge instructions reviewed with pt by SWOT RN. PIV removed, VSS. Pt assisted to car via [employer].  Shelbie Ammons

## 2019-03-17 ENCOUNTER — Other Ambulatory Visit: Payer: Self-pay | Admitting: Internal Medicine

## 2019-03-17 DIAGNOSIS — Z1231 Encounter for screening mammogram for malignant neoplasm of breast: Secondary | ICD-10-CM

## 2019-03-17 DIAGNOSIS — R9389 Abnormal findings on diagnostic imaging of other specified body structures: Secondary | ICD-10-CM

## 2019-03-17 DIAGNOSIS — J439 Emphysema, unspecified: Secondary | ICD-10-CM

## 2019-03-17 DIAGNOSIS — R0600 Dyspnea, unspecified: Secondary | ICD-10-CM

## 2019-03-17 DIAGNOSIS — Z950 Presence of cardiac pacemaker: Secondary | ICD-10-CM | POA: Insufficient documentation

## 2019-03-20 ENCOUNTER — Emergency Department: Payer: Medicare Other

## 2019-03-20 ENCOUNTER — Inpatient Hospital Stay
Admission: EM | Admit: 2019-03-20 | Discharge: 2019-03-22 | DRG: 083 | Disposition: A | Discharge status: Skilled Nursing Facility | Payer: Medicare Other | Attending: Internal Medicine | Admitting: Internal Medicine

## 2019-03-20 ENCOUNTER — Other Ambulatory Visit: Payer: Self-pay

## 2019-03-20 ENCOUNTER — Inpatient Hospital Stay: Payer: Medicare Other

## 2019-03-20 DIAGNOSIS — E039 Hypothyroidism, unspecified: Secondary | ICD-10-CM | POA: Diagnosis present

## 2019-03-20 DIAGNOSIS — E119 Type 2 diabetes mellitus without complications: Secondary | ICD-10-CM | POA: Diagnosis present

## 2019-03-20 DIAGNOSIS — M25559 Pain in unspecified hip: Secondary | ICD-10-CM

## 2019-03-20 DIAGNOSIS — Z950 Presence of cardiac pacemaker: Secondary | ICD-10-CM | POA: Diagnosis not present

## 2019-03-20 DIAGNOSIS — W1830XA Fall on same level, unspecified, initial encounter: Secondary | ICD-10-CM | POA: Diagnosis present

## 2019-03-20 DIAGNOSIS — I10 Essential (primary) hypertension: Secondary | ICD-10-CM | POA: Diagnosis present

## 2019-03-20 DIAGNOSIS — J449 Chronic obstructive pulmonary disease, unspecified: Secondary | ICD-10-CM | POA: Diagnosis present

## 2019-03-20 DIAGNOSIS — Z7982 Long term (current) use of aspirin: Secondary | ICD-10-CM | POA: Diagnosis not present

## 2019-03-20 DIAGNOSIS — R102 Pelvic and perineal pain: Secondary | ICD-10-CM | POA: Diagnosis present

## 2019-03-20 DIAGNOSIS — N179 Acute kidney failure, unspecified: Secondary | ICD-10-CM | POA: Diagnosis present

## 2019-03-20 DIAGNOSIS — R251 Tremor, unspecified: Secondary | ICD-10-CM | POA: Diagnosis present

## 2019-03-20 DIAGNOSIS — Z79891 Long term (current) use of opiate analgesic: Secondary | ICD-10-CM

## 2019-03-20 DIAGNOSIS — I2510 Atherosclerotic heart disease of native coronary artery without angina pectoris: Secondary | ICD-10-CM | POA: Diagnosis present

## 2019-03-20 DIAGNOSIS — M6282 Rhabdomyolysis: Secondary | ICD-10-CM | POA: Diagnosis present

## 2019-03-20 DIAGNOSIS — F17210 Nicotine dependence, cigarettes, uncomplicated: Secondary | ICD-10-CM | POA: Diagnosis present

## 2019-03-20 DIAGNOSIS — Z9049 Acquired absence of other specified parts of digestive tract: Secondary | ICD-10-CM | POA: Diagnosis not present

## 2019-03-20 DIAGNOSIS — S12500D Unspecified displaced fracture of sixth cervical vertebra, subsequent encounter for fracture with routine healing: Secondary | ICD-10-CM | POA: Diagnosis not present

## 2019-03-20 DIAGNOSIS — G894 Chronic pain syndrome: Secondary | ICD-10-CM | POA: Diagnosis present

## 2019-03-20 DIAGNOSIS — Z885 Allergy status to narcotic agent status: Secondary | ICD-10-CM

## 2019-03-20 DIAGNOSIS — I110 Hypertensive heart disease with heart failure: Secondary | ICD-10-CM | POA: Diagnosis present

## 2019-03-20 DIAGNOSIS — I5032 Chronic diastolic (congestive) heart failure: Secondary | ICD-10-CM | POA: Diagnosis present

## 2019-03-20 DIAGNOSIS — Z20822 Contact with and (suspected) exposure to covid-19: Secondary | ICD-10-CM | POA: Diagnosis present

## 2019-03-20 DIAGNOSIS — S065X9A Traumatic subdural hemorrhage with loss of consciousness of unspecified duration, initial encounter: Principal | ICD-10-CM | POA: Diagnosis present

## 2019-03-20 DIAGNOSIS — Z90710 Acquired absence of both cervix and uterus: Secondary | ICD-10-CM

## 2019-03-20 DIAGNOSIS — D72829 Elevated white blood cell count, unspecified: Secondary | ICD-10-CM | POA: Diagnosis present

## 2019-03-20 DIAGNOSIS — I495 Sick sinus syndrome: Secondary | ICD-10-CM | POA: Diagnosis present

## 2019-03-20 DIAGNOSIS — S12501D Unspecified nondisplaced fracture of sixth cervical vertebra, subsequent encounter for fracture with routine healing: Secondary | ICD-10-CM

## 2019-03-20 DIAGNOSIS — S12500A Unspecified displaced fracture of sixth cervical vertebra, initial encounter for closed fracture: Secondary | ICD-10-CM | POA: Diagnosis present

## 2019-03-20 DIAGNOSIS — Z794 Long term (current) use of insulin: Secondary | ICD-10-CM

## 2019-03-20 DIAGNOSIS — S065XAA Traumatic subdural hemorrhage with loss of consciousness status unknown, initial encounter: Secondary | ICD-10-CM | POA: Diagnosis present

## 2019-03-20 DIAGNOSIS — R296 Repeated falls: Secondary | ICD-10-CM | POA: Diagnosis present

## 2019-03-20 DIAGNOSIS — W19XXXA Unspecified fall, initial encounter: Secondary | ICD-10-CM

## 2019-03-20 DIAGNOSIS — K219 Gastro-esophageal reflux disease without esophagitis: Secondary | ICD-10-CM | POA: Diagnosis present

## 2019-03-20 DIAGNOSIS — E782 Mixed hyperlipidemia: Secondary | ICD-10-CM | POA: Diagnosis not present

## 2019-03-20 DIAGNOSIS — I5033 Acute on chronic diastolic (congestive) heart failure: Secondary | ICD-10-CM | POA: Diagnosis present

## 2019-03-20 DIAGNOSIS — F329 Major depressive disorder, single episode, unspecified: Secondary | ICD-10-CM | POA: Diagnosis present

## 2019-03-20 DIAGNOSIS — Z79890 Hormone replacement therapy (postmenopausal): Secondary | ICD-10-CM

## 2019-03-20 DIAGNOSIS — Z79899 Other long term (current) drug therapy: Secondary | ICD-10-CM

## 2019-03-20 DIAGNOSIS — E785 Hyperlipidemia, unspecified: Secondary | ICD-10-CM | POA: Diagnosis present

## 2019-03-20 DIAGNOSIS — Z888 Allergy status to other drugs, medicaments and biological substances status: Secondary | ICD-10-CM

## 2019-03-20 DIAGNOSIS — N189 Chronic kidney disease, unspecified: Secondary | ICD-10-CM

## 2019-03-20 LAB — CBC WITH DIFFERENTIAL/PLATELET
Abs Immature Granulocytes: 0.05 10*3/uL (ref 0.00–0.07)
Basophils Absolute: 0.1 10*3/uL (ref 0.0–0.1)
Basophils Relative: 1 %
Eosinophils Absolute: 0 10*3/uL (ref 0.0–0.5)
Eosinophils Relative: 0 %
HCT: 42 % (ref 36.0–46.0)
Hemoglobin: 13.9 g/dL (ref 12.0–15.0)
Immature Granulocytes: 0 %
Lymphocytes Relative: 18 %
Lymphs Abs: 2.2 10*3/uL (ref 0.7–4.0)
MCH: 30.4 pg (ref 26.0–34.0)
MCHC: 33.1 g/dL (ref 30.0–36.0)
MCV: 91.9 fL (ref 80.0–100.0)
Monocytes Absolute: 0.8 10*3/uL (ref 0.1–1.0)
Monocytes Relative: 6 %
Neutro Abs: 9.2 10*3/uL — ABNORMAL HIGH (ref 1.7–7.7)
Neutrophils Relative %: 75 %
Platelets: 349 10*3/uL (ref 150–400)
RBC: 4.57 MIL/uL (ref 3.87–5.11)
RDW: 15.2 % (ref 11.5–15.5)
WBC: 12.3 10*3/uL — ABNORMAL HIGH (ref 4.0–10.5)
nRBC: 0 % (ref 0.0–0.2)

## 2019-03-20 LAB — BASIC METABOLIC PANEL
Anion gap: 12 (ref 5–15)
BUN: 23 mg/dL (ref 8–23)
CO2: 27 mmol/L (ref 22–32)
Calcium: 10 mg/dL (ref 8.9–10.3)
Chloride: 97 mmol/L — ABNORMAL LOW (ref 98–111)
Creatinine, Ser: 1.8 mg/dL — ABNORMAL HIGH (ref 0.44–1.00)
GFR calc Af Amer: 32 mL/min — ABNORMAL LOW (ref >60)
GFR calc non Af Amer: 27 mL/min — ABNORMAL LOW (ref >60)
Glucose, Bld: 144 mg/dL — ABNORMAL HIGH (ref 70–99)
Potassium: 4.1 mmol/L (ref 3.5–5.1)
Sodium: 136 mmol/L (ref 135–145)

## 2019-03-20 LAB — URINALYSIS, COMPLETE (UACMP) WITH MICROSCOPIC
Bilirubin Urine: NEGATIVE
Glucose, UA: 150 mg/dL — AB
Hgb urine dipstick: NEGATIVE
Ketones, ur: NEGATIVE mg/dL
Leukocytes,Ua: NEGATIVE
Nitrite: NEGATIVE
Protein, ur: 30 mg/dL — AB
Specific Gravity, Urine: 1.013 (ref 1.005–1.030)
pH: 5 (ref 5.0–8.0)

## 2019-03-20 LAB — TSH: TSH: 65.928 u[IU]/mL — ABNORMAL HIGH (ref 0.350–4.500)

## 2019-03-20 LAB — T4, FREE: Free T4: 0.6 ng/dL — ABNORMAL LOW (ref 0.61–1.12)

## 2019-03-20 LAB — CK: Total CK: 1077 U/L — ABNORMAL HIGH (ref 38–234)

## 2019-03-20 MED ORDER — SODIUM CHLORIDE 0.9 % IV SOLN: Freq: Once | INTRAVENOUS | Status: AC

## 2019-03-20 MED ORDER — SODIUM CHLORIDE 0.9 % IV BOLUS
1000.0000 mL | Freq: Once | INTRAVENOUS | Status: AC
  Administered 2019-03-20: 1000 mL via INTRAVENOUS

## 2019-03-20 NOTE — ED Triage Notes (Signed)
Pt arrives via EMS from home after having an unwitnessed fall- pt was seen here Monday and dx with a c6 fracture- aspen collar in place- pt homehealth nurse found pt on floor- pt states she was standing sorting papers and then she fell, states she did hit her head- pt is slightly lethargic

## 2019-03-20 NOTE — ED Notes (Signed)
Pt placed in clean gown and clean brief. Purewick device changed. Pt moved up in bed and repositioned.

## 2019-03-20 NOTE — ED Provider Notes (Signed)
[facility] Emergency Department Provider Note  ____________________________________________   First MD Initiated Contact with Patient 03/20/19 1607     (approximate)  I have reviewed the triage vital signs and the nursing notes.   HISTORY  Chief Complaint Fall    HPI [place] F Keelin is a 74 y.o. female  With h/o CHF, CAD, HTN, HLD, here with fall, AMS. Pt recently was just admitted for similar presentation, found to have C6 cervical fx, treated non-op, with encephalopathy of unclear etiology that cleared slowly.     Pt somewhat confused limiting history, but per report, she was sorting her mail and fell, hitting her head. Doesn't know what made her fall. She's been feeling "dizzy" a few days. She does not believe she passed out. No other areas of injury or pain. Her neck pain has been constant and not acutely worsened, no UE weakness or numbness.   After falling, she was unable to get back up 2/2 weakness. Lives mostly alone, her significant other is often gone. She had to wait until a [facility] aide came in to pick her up. Reports some mild R hip and head pain 2/2 fall. Pain is aching, throbbing, worse w/ movement.     Past Medical History:  Diagnosis Date  . Arthritis   . CHF (congestive heart failure) (HCC)   . Coronary arteriosclerosis   . DDD (degenerative disc disease), cervical   . DDD (degenerative disc disease), lumbar   . Depression   . Diabetes mellitus without complication (HCC)   . GERD (gastroesophageal reflux disease)   . Hyperlipidemia   . Hypertension   . Osteoarthritis   . Pacemaker   . Tremor     Patient Active Problem List   Diagnosis Date Noted  . Hypothyroidism 03/10/2019  . Depression   . Chronic diastolic CHF (congestive heart failure) (HCC)   . Acute metabolic encephalopathy   . Fall   . Tobacco abuse   . C6 cervical fracture (HCC)   . Acute delirium 05/12/2017  . Seizure (HCC) 05/10/2017  . Elevated sedimentation rate  04/04/2017  . Elevated C-reactive protein (CRP) 04/04/2017  . Chest pain, unspecified 04/02/2017  . Bilateral lower extremity pain (Secondary Area of Pain) (R>L) 04/02/2017  . Chronic hip pain, bilateral  (Secondary Area of Pain) (R>L) 04/02/2017  . Chronic bilateral low back pain with bilateral sciatica [facility] Area of Pain) (R>L) 04/02/2017  . Chronic pain syndrome 04/02/2017  . Disorder of bone, unspecified 04/02/2017  . Other long term (current) drug therapy 04/02/2017  . Other specified health status 04/02/2017  . Long term current use of opiate analgesic 04/02/2017  . Hip pain, bilateral 12/06/2016  . Tremor 05/27/2016  . Trigger finger of left hand 02/16/2016  . Osteoarthritis of both hands 02/16/2016  . Angina pectoris (HCC) 01/17/2016  . COPD (chronic obstructive pulmonary disease) (HCC) 01/17/2016  . CAD (coronary artery disease) 01/17/2016  . HLD (hyperlipidemia) 01/17/2016  . Hypertension 01/17/2016  . Sick sinus syndrome (HCC) 01/17/2016  . Chronic pain 01/17/2016  . Personal history of tobacco use, presenting hazards to health 11/16/2015  . Type II diabetes mellitus with renal manifestations (HCC) 05/12/2013    Past Surgical History:  Procedure Laterality Date  . ABDOMINAL HYSTERECTOMY    . BREAST BIOPSY Right 1994   neg cyst removed  . CHOLECYSTECTOMY    . EYE SURGERY  1964, 1966, and 1967   bilateral  . FOOT SURGERY Right    cellulitis  . PACEMAKER  INSERTION    . SPINE SURGERY     cyst removed; [facility]    Prior to Admission medications   Medication Sig Start Date End Date Taking? Authorizing Provider  amLODipine (NORVASC) 10 MG tablet TAKE 1 TABLET BY MOUTH EVERY DAY 08/28/16  Yes Kathrine Haddock, NP  aspirin EC 81 MG tablet Take 81 mg by mouth daily.   Yes [provider]  benztropine (COGENTIN) 0.5 MG tablet Take 0.5 mg by mouth 2 (two) times daily.   Yes [provider]  brexpiprazole (REXULTI) 1 MG TABS tablet Take 1 mg by mouth  at bedtime.   Yes [provider]  calcitRIOL (ROCALTROL) 0.25 MCG capsule Take 0.25 mcg by mouth daily.   Yes [provider]  citalopram (CELEXA) 20 MG tablet Take 20 mg by mouth daily.   Yes [provider]  famotidine (PEPCID) 40 MG tablet Take 40 mg by mouth daily.   Yes [provider]  fenofibrate (TRICOR) 48 MG tablet Take 48 mg by mouth daily.   Yes [provider]  furosemide (LASIX) 20 MG tablet Take 20 mg by mouth daily.   Yes [provider]  gabapentin (NEURONTIN) 100 MG capsule Take 100-300 mg by mouth See admin instructions. Take 100 mg in the morning, and at noon, take 300 mg at bedtime   Yes [provider]  levothyroxine (SYNTHROID) 100 MCG tablet Take 1 tablet (100 mcg total) by mouth daily before breakfast. 03/12/19  Yes Dhungel, Nishant, MD  losartan (COZAAR) 50 MG tablet Take 50 mg by mouth daily.   Yes [provider]  meloxicam (MOBIC) 15 MG tablet Take 15 mg by mouth daily.   Yes [provider]  metFORMIN (GLUCOPHAGE) 1000 MG tablet Take 1,000 mg by mouth 2 (two) times daily with a meal.   Yes [provider]  metoprolol tartrate (LOPRESSOR) 25 MG tablet Take 25 mg by mouth 2 (two) times daily.   Yes [provider]  montelukast (SINGULAIR) 10 MG tablet Take 10 mg by mouth at bedtime.   Yes [provider]  nicotine (NICODERM CQ - DOSED IN MG/24 HOURS) 21 mg/24hr patch Place 1 patch (21 mg total) onto the skin daily. 03/13/19  Yes Dhungel, Nishant, MD  nitroGLYCERIN (NITRODUR - DOSED IN MG/24 HR) 0.2 mg/hr patch Place 0.2 mg onto the skin daily. leave patch on 12-14 hours, then remove for 10-12 hours prior to applying the next patch   Yes [provider]  oxyCODONE-acetaminophen (PERCOCET/ROXICET) 5-325 MG tablet Take 1 tablet by mouth every 6 (six) hours as needed for moderate pain. 03/12/19  Yes Dhungel, Nishant, MD  primidone (MYSOLINE) 50 MG tablet Take 50 mg  by mouth 2 (two) times daily.   Yes [provider]  rosuvastatin (CRESTOR) 40 MG tablet Take 40 mg by mouth at bedtime.   Yes [provider]  sitaGLIPtin (JANUVIA) 50 MG tablet Take 50 mg by mouth daily.   Yes [provider]  traMADol (ULTRAM) 50 MG tablet Take 1 tablet (50 mg total) by mouth every 6 (six) hours as needed. Patient taking differently: Take 50 mg by mouth every 8 (eight) hours as needed.  11/14/18 11/14/19 Yes Earleen Newport, MD  vitamin B-12 (CYANOCOBALAMIN) 1000 MCG tablet Take 1,000 mcg by mouth daily.   Yes [provider]  LANTUS SOLOSTAR 100 UNIT/ML Solostar Pen Inject 20 Units into the skin at bedtime. 03/19/19   [provider]    Allergies Alprazolam,  Fentanyl, Meperidine, and Ranitidine hcl  Family History  Problem Relation Age of Onset  . Breast cancer Maternal Aunt 40  . Cancer Mother        colon  . Aneurysm Father     Social History Social History   Tobacco Use  . Smoking status: Current Every Day Smoker    Packs/day: 1.00    Years: 50.00    Pack years: 50.00    Types: Cigarettes  . Smokeless tobacco: Never Used  Substance Use Topics  . Alcohol use: No  . Drug use: No    Review of Systems  Review of Systems  Constitutional: Positive for fatigue. Negative for chills and fever.  HENT: Negative for sore throat.   Respiratory: Negative for shortness of breath.   Cardiovascular: Negative for chest pain.  Gastrointestinal: Negative for abdominal pain.  Genitourinary: Negative for flank pain.  Musculoskeletal: Positive for arthralgias and gait problem. Negative for neck pain.  Skin: Negative for rash and wound.  Allergic/Immunologic: Negative for immunocompromised state.  Neurological: Positive for weakness and light-headedness. Negative for numbness.  Hematological: Does not bruise/bleed easily.  All other systems reviewed and are negative.     ____________________________________________  PHYSICAL EXAM:      VITAL SIGNS: ED Triage Vitals  Enc Vitals Group     BP 03/20/19 1610 (!) 151/75     Pulse Rate 03/20/19 1610 70     Resp 03/20/19 1610 16     Temp 03/20/19 1615 97.7 F (36.5 C)     Temp Source 03/20/19 1615 Oral     SpO2 03/20/19 1610 95 %     Weight 03/20/19 1606 149 lb 14.6 oz (68 kg)     Height 03/20/19 1606 5\' 3"  (1.6 m)     Head Circumference --      Peak Flow --      Pain Score 03/20/19 1605 6     Pain Loc --      Pain Edu? --      Excl. in GC? --      Physical Exam Vitals and nursing note reviewed.  Constitutional:      General: She is not in acute distress.    Appearance: She is well-developed.  HENT:     Head: Normocephalic and atraumatic.     Comments: No apparent bony TTP or deformity. No signs of trauma externally. Eyes:     Conjunctiva/sclera: Conjunctivae normal.  Neck:     Comments: Hard cervical collar in place Cardiovascular:     Rate and Rhythm: Normal rate and regular rhythm.     Heart sounds: Normal heart sounds. No murmur. No friction rub.  Pulmonary:     Effort: Pulmonary effort is normal. No respiratory distress.     Breath sounds: Normal breath sounds. No wheezing or rales.  Abdominal:     General: There is no distension.     Palpations: Abdomen is soft.     Tenderness: There is no abdominal tenderness.  Musculoskeletal:     Cervical back: Neck supple.     Comments: Mild TTP R hip, no deformity, no shortening  Skin:    General: Skin is warm.     Capillary Refill: Capillary refill takes less than 2 seconds.  Neurological:     Mental Status: She is alert and oriented to person, place, and time.     Motor: No abnormal muscle tone.       ____________________________________________   LABS (all labs ordered are listed, but  only abnormal results are displayed)  Labs Reviewed  CBC WITH DIFFERENTIAL/PLATELET - Abnormal; Notable for the following components:       Result Value   WBC 12.3 (*)    Neutro Abs 9.2 (*)    All other components within normal limits  BASIC METABOLIC PANEL - Abnormal; Notable for the following components:   Chloride 97 (*)    Glucose, Bld 144 (*)    Creatinine, Ser 1.80 (*)    GFR calc non Af Amer 27 (*)    GFR calc Af Amer 32 (*)    All other components within normal limits  BLOOD GAS, VENOUS - Abnormal; Notable for the following components:   Bicarbonate 30.6 (*)    Acid-Base Excess 4.0 (*)    All other components within normal limits  URINALYSIS, COMPLETE (UACMP) WITH MICROSCOPIC - Abnormal; Notable for the following components:   Color, Urine YELLOW (*)    APPearance CLEAR (*)    Glucose, UA 150 (*)    Protein, ur 30 (*)    Bacteria, UA RARE (*)    All other components within normal limits  TSH - Abnormal; Notable for the following components:   TSH 65.928 (*)    All other components within normal limits  T4, FREE - Abnormal; Notable for the following components:   Free T4 0.60 (*)    All other components within normal limits  CK - Abnormal; Notable for the following components:   Total CK 1,077 (*)    All other components within normal limits  SARS CORONAVIRUS 2 (TAT 6-24 HRS)    ____________________________________________  EKG: Normal sinus rhythm, VR 70. QRS 138, QTc 440. LCH with repol. No ST elevations. ________________________________________  RADIOLOGY All imaging, including plain films, CT scans, and ultrasounds, independently reviewed by me, and interpretations confirmed via formal radiology reads.  ED MD interpretation:   CXR: Clear, no PTX or rib fx CT Head: Small 2-3 mm parafalcine SDH, no midline shift CT C-Spine: C6 fx unchanged  Official radiology report(s): CT Head Wo Contrast  Result Date: 03/20/2019 CLINICAL DATA:  74 year old female with trauma. EXAM: CT HEAD WITHOUT CONTRAST CT CERVICAL SPINE WITHOUT CONTRAST TECHNIQUE: Multidetector CT imaging of the head and cervical spine was  performed following the standard protocol without intravenous contrast. Multiplanar CT image reconstructions of the cervical spine were also generated. COMPARISON:  Head CT dated 03/10/2019. FINDINGS: CT HEAD FINDINGS Brain: Mild age-related atrophy and chronic microvascular ischemic changes. There is a small right parafalcine subdural hemorrhage measuring approximately 2 mm in thickness. No mass effect or midline shift. Small right lentiform nucleus old lacunar infarct noted. Vascular: No hyperdense vessel or unexpected calcification. Skull: Normal. Negative for fracture or focal lesion. Sinuses/Orbits: Mild mucoperiosteal thickening of paranasal sinuses. No air-fluid level. The mastoid air cells are clear. Other: Right posterior parietal scalp hematoma. CT CERVICAL SPINE FINDINGS Alignment: No acute subluxation. There is straightening of normal cervical lordosis which may be positional or due to muscle spasm. Skull base and vertebrae: Similar appearance of the fracture involving the left anterior inferior endplate of C6 as seen on the prior CT of 03/10/2019. No new fractures. The bones are osteopenic. There is incomplete bony fusion of the posterior ring of C1. Soft tissues and spinal canal: No prevertebral fluid or swelling. No visible canal hematoma. Disc levels: Multilevel degenerative changes. Partial ankylosis at C4-C5. Multilevel facet arthropathy. Upper chest: Negative. Other: Bilateral carotid bulb calcified plaques. IMPRESSION: 1. Small right parafalcine subdural hemorrhage measuring 2-3 mm  in thickness. No mass effect or midline shift. 2. Nondisplaced fracture of the left anterior inferior endplate of C6 as seen on the prior CT. No new cervical spine fracture. These results were called by telephone at the time of interpretation on 03/20/2019 at 5:35 pm to provider Shaune Pollack , who verbally acknowledged these results. Electronically Signed   By: Elgie Collard M.D.   On: 03/20/2019 17:40   CT  Cervical Spine Wo Contrast  Result Date: 03/20/2019 CLINICAL DATA:  74 year old female with trauma. EXAM: CT HEAD WITHOUT CONTRAST CT CERVICAL SPINE WITHOUT CONTRAST TECHNIQUE: Multidetector CT imaging of the head and cervical spine was performed following the standard protocol without intravenous contrast. Multiplanar CT image reconstructions of the cervical spine were also generated. COMPARISON:  Head CT dated 03/10/2019. FINDINGS: CT HEAD FINDINGS Brain: Mild age-related atrophy and chronic microvascular ischemic changes. There is a small right parafalcine subdural hemorrhage measuring approximately 2 mm in thickness. No mass effect or midline shift. Small right lentiform nucleus old lacunar infarct noted. Vascular: No hyperdense vessel or unexpected calcification. Skull: Normal. Negative for fracture or focal lesion. Sinuses/Orbits: Mild mucoperiosteal thickening of paranasal sinuses. No air-fluid level. The mastoid air cells are clear. Other: Right posterior parietal scalp hematoma. CT CERVICAL SPINE FINDINGS Alignment: No acute subluxation. There is straightening of normal cervical lordosis which may be positional or due to muscle spasm. Skull base and vertebrae: Similar appearance of the fracture involving the left anterior inferior endplate of C6 as seen on the prior CT of 03/10/2019. No new fractures. The bones are osteopenic. There is incomplete bony fusion of the posterior ring of C1. Soft tissues and spinal canal: No prevertebral fluid or swelling. No visible canal hematoma. Disc levels: Multilevel degenerative changes. Partial ankylosis at C4-C5. Multilevel facet arthropathy. Upper chest: Negative. Other: Bilateral carotid bulb calcified plaques. IMPRESSION: 1. Small right parafalcine subdural hemorrhage measuring 2-3 mm in thickness. No mass effect or midline shift. 2. Nondisplaced fracture of the left anterior inferior endplate of C6 as seen on the prior CT. No new cervical spine fracture. These  results were called by telephone at the time of interpretation on 03/20/2019 at 5:35 pm to provider Shaune Pollack , who verbally acknowledged these results. Electronically Signed   By: Elgie Collard M.D.   On: 03/20/2019 17:40   DG Chest Portable 1 View  Result Date: 03/20/2019 CLINICAL DATA:  Fall EXAM: PORTABLE CHEST 1 VIEW COMPARISON:  03/10/2019 FINDINGS: Left pacer remains in place, unchanged. Heart is borderline in size. No confluent airspace opacities or effusions. No acute bony abnormality. IMPRESSION: No active disease. Electronically Signed   By: Charlett Nose M.D.   On: 03/20/2019 17:49   DG HIP UNILAT WITH PELVIS 2-3 VIEWS RIGHT  Result Date: 03/20/2019 CLINICAL DATA:  Bilateral hip pain, unwitnessed fall EXAM: DG HIP (WITH OR WITHOUT PELVIS) 2-3V RIGHT COMPARISON:  06/29/2017 FINDINGS: Hip joints and SI joints are symmetric and unremarkable. No acute bony abnormality. Specifically, no fracture, subluxation, or dislocation. IMPRESSION: No acute bony abnormality. Electronically Signed   By: Charlett Nose M.D.   On: 03/20/2019 17:50    ____________________________________________  PROCEDURES   Procedure(s) performed (including Critical Care):  Procedures  ____________________________________________  INITIAL IMPRESSION / MDM / ASSESSMENT AND PLAN / ED COURSE  As part of my medical decision making, I reviewed the following data within the electronic MEDICAL RECORD NUMBER Nursing notes reviewed and incorporated, Old chart reviewed, Notes from prior ED visits, and [place] Controlled Substance Database       *  [place] F Lackey was evaluated in Emergency Department on 03/20/2019 for the symptoms described in the history of present illness. She was evaluated in the context of the global COVID-19 pandemic, which necessitated consideration that the patient might be at risk for infection with the SARS-CoV-2 virus that causes COVID-19. Institutional protocols and algorithms that pertain to the  evaluation of patients at risk for COVID-19 are in a state of rapid change based on information released by regulatory bodies including the CDC and federal and state organizations. These policies and algorithms were followed during the patient's care in the ED.  Some ED evaluations and interventions may be delayed as a result of limited staffing during the pandemic.*     Medical Decision Making:  73 yo F here with fall. Imaging shows new small SDH - non op per discussion with Dr. Adriana Simas. He recommends repeat CT head in 6 hours. Otherwise, labs show moderate AKI and I suspect pt's fall is 2/2 weakness, dehydration and failure to thrive in setting of her cervical injury and now SDH. Will likely need SNF placement. Admit for IVF. CK also elevated - will need hydration overnight. Neuro intact.  ____________________________________________  FINAL CLINICAL IMPRESSION(S) / ED DIAGNOSES  Final diagnoses:  Subdural hematoma (HCC)  Fall, initial encounter  AKI (acute kidney injury) (HCC)     MEDICATIONS GIVEN DURING THIS VISIT:  Medications  0.9 %  sodium chloride infusion (has no administration in time range)  sodium chloride 0.9 % bolus 1,000 mL (1,000 mLs Intravenous New Bag/Given 03/20/19 1845)     ED Discharge Orders    None       Note:  This document was prepared using Dragon voice recognition software and may include unintentional dictation errors.   Shaune Pollack, MD 03/20/19 [phone]

## 2019-03-20 NOTE — H&P (Signed)
History and Physical    Melanie King YTK:[id number] DOB: 11-23-1945 DOA: 03/20/2019  PCP: Patient, No Pcp Per  Patient coming from: home  I have personally briefly reviewed patient's old medical records in [facility]  Chief Complaint: fall   HPI: Melanie King is a 74 y.o. female with medical history significant of chronic diastolic heart failure, sick sinus syndrome s/p pacemaker, CAD, COPD, Type 2 diabetes, hypothyroidism and chronic pain who presents with recurrent falls.  She was just discharged yesterday with non-operable C6 cervical fracture and wearing C-collar following a fall.  States she was standing by a table and fell when she tried to turn around. Denies hitting her head or any loss of consciousness. Fall was unwitnessed. States a home health aid found her. Other than that patient not able to provide much history and could not recall many details surrounding why she came in today.   ED Course: She was afrebile, normotensive on room air.  WBC of 12.3. Glucose of 144, creatinine of 1.80. CK of 1077. TSH of 65. Free T4 of 0.60. Negative UA.   CT head with small right parafalcine subdural hemorrhage measuring 2-74mm in thickness. No mass effect or midline shift.  CXR negative.   Review of Systems:  Constitutional: No Weight Change, No Fever ENT/Mouth: No sore throat Eyes: No Vision Changes Cardiovascular: No Chest Pain, no SOB Respiratory: No Cough,   Gastrointestinal: No Nausea, No Vomiting, No Pain Genitourinary: no dysuria  Musculoskeletal: No Arthralgias, No Myalgias Skin: No Skin Lesions, No Pruritus, Neuro: no Weakness, No Numbness,  No Loss of Consciousness, No Syncope Psych: No Anxiety/Panic, No Depression, no decrease appetite Heme/Lymph: No Bruising, No Bleeding  Past Medical History:  Diagnosis Date  . Arthritis   . CHF (congestive heart failure) (HCC)   . Coronary arteriosclerosis   . DDD (degenerative disc disease), cervical   . DDD  (degenerative disc disease), lumbar   . Depression   . Diabetes mellitus without complication (HCC)   . GERD (gastroesophageal reflux disease)   . Hyperlipidemia   . Hypertension   . Osteoarthritis   . Pacemaker   . Tremor     Past Surgical History:  Procedure Laterality Date  . ABDOMINAL HYSTERECTOMY    . BREAST BIOPSY Right 1994   neg cyst removed  . CHOLECYSTECTOMY    . EYE SURGERY  1964, 1966, and 1967   bilateral  . FOOT SURGERY Right    cellulitis  . PACEMAKER INSERTION    . SPINE SURGERY     cyst removed; [facility]     reports that she has been smoking cigarettes. She has a 50.00 pack-year smoking history. She has never used smokeless tobacco. She reports that she does not drink alcohol or use drugs.  Allergies  Allergen Reactions  . Alprazolam Swelling  . Fentanyl Other (See Comments)    "burning and hot"  . Meperidine Itching    Other reaction(s): Other (See Comments)  . Ranitidine Hcl     Other reaction(s): Other (See Comments)    Family History  Problem Relation Age of Onset  . Breast cancer Maternal Aunt 40  . Cancer Mother        colon  . Aneurysm Father      Prior to Admission medications   Medication Sig Start Date End Date Taking? Authorizing Provider  amLODipine (NORVASC) 10 MG tablet TAKE 1 TABLET BY MOUTH EVERY DAY 08/28/16  Yes Gabriel Cirri, NP  aspirin EC 81 MG  tablet Take 81 mg by mouth daily.   Yes [provider]  benztropine (COGENTIN) 0.5 MG tablet Take 0.5 mg by mouth 2 (two) times daily.   Yes [provider]  brexpiprazole (REXULTI) 1 MG TABS tablet Take 1 mg by mouth at bedtime.   Yes [provider]  calcitRIOL (ROCALTROL) 0.25 MCG capsule Take 0.25 mcg by mouth daily.   Yes [provider]  citalopram (CELEXA) 20 MG tablet Take 20 mg by mouth daily.   Yes [provider]  famotidine (PEPCID) 40 MG tablet Take 40 mg by mouth daily.   Yes [provider]  fenofibrate  (TRICOR) 48 MG tablet Take 48 mg by mouth daily.   Yes [provider]  furosemide (LASIX) 20 MG tablet Take 20 mg by mouth daily.   Yes [provider]  gabapentin (NEURONTIN) 100 MG capsule Take 100-300 mg by mouth See admin instructions. Take 100 mg in the morning, and at noon, take 300 mg at bedtime   Yes [provider]  levothyroxine (SYNTHROID) 100 MCG tablet Take 1 tablet (100 mcg total) by mouth daily before breakfast. 03/12/19  Yes Dhungel, Nishant, MD  losartan (COZAAR) 50 MG tablet Take 50 mg by mouth daily.   Yes [provider]  meloxicam (MOBIC) 15 MG tablet Take 15 mg by mouth daily.   Yes [provider]  metFORMIN (GLUCOPHAGE) 1000 MG tablet Take 1,000 mg by mouth 2 (two) times daily with a meal.   Yes [provider]  metoprolol tartrate (LOPRESSOR) 25 MG tablet Take 25 mg by mouth 2 (two) times daily.   Yes [provider]  montelukast (SINGULAIR) 10 MG tablet Take 10 mg by mouth at bedtime.   Yes [provider]  nicotine (NICODERM CQ - DOSED IN MG/24 HOURS) 21 mg/24hr patch Place 1 patch (21 mg total) onto the skin daily. 03/13/19  Yes Dhungel, Nishant, MD  nitroGLYCERIN (NITRODUR - DOSED IN MG/24 HR) 0.2 mg/hr patch Place 0.2 mg onto the skin daily. leave patch on 12-14 hours, then remove for 10-12 hours prior to applying the next patch   Yes [provider]  oxyCODONE-acetaminophen (PERCOCET/ROXICET) 5-325 MG tablet Take 1 tablet by mouth every 6 (six) hours as needed for moderate pain. 03/12/19  Yes Dhungel, Nishant, MD  primidone (MYSOLINE) 50 MG tablet Take 50 mg by mouth 2 (two) times daily.   Yes [provider]  rosuvastatin (CRESTOR) 40 MG tablet Take 40 mg by mouth at bedtime.   Yes [provider]  sitaGLIPtin (JANUVIA) 50 MG tablet Take 50 mg by mouth daily.   Yes [provider]  traMADol (ULTRAM) 50 MG tablet Take 1 tablet (50 mg total) by mouth every 6 (six) hours  as needed. Patient taking differently: Take 50 mg by mouth every 8 (eight) hours as needed.  11/14/18 11/14/19 Yes Emily Filbert, MD  vitamin B-12 (CYANOCOBALAMIN) 1000 MCG tablet Take 1,000 mcg by mouth daily.   Yes [provider]  LANTUS SOLOSTAR 100 UNIT/ML Solostar Pen Inject 20 Units into the skin at bedtime. 03/19/19   [provider]    Physical Exam: Vitals:   03/20/19 1606 03/20/19 1610 03/20/19 1615 03/20/19 1630  BP:  (!) 151/75  (!) 144/69  Pulse:  70  73  Resp:  16    Temp:   97.7 F (36.5 C)   TempSrc:   Oral   SpO2:  95%  97%  Weight: 68 kg  Height: 5\' 3"  (1.6 m)       Constitutional: NAD, calm, comfortable, laying flat in bed with C-collar in place. Has hearing impairment.  Vitals:   03/20/19 1606 03/20/19 1610 03/20/19 1615 03/20/19 1630  BP:  (!) 151/75  (!) 144/69  Pulse:  70  73  Resp:  16    Temp:   97.7 F (36.5 C)   TempSrc:   Oral   SpO2:  95%  97%  Weight: 68 kg     Height: 5\' 3"  (1.6 m)      Eyes: PERRL, lids and conjunctivae normal ENMT: Mucous membranes are dry.  Neck: normal, supple. C-collar in place. Respiratory: clear to auscultation bilaterally, no wheezing, no crackles. Normal respiratory effort.  Cardiovascular: Regular rate and rhythm, 2/6 systolic murmur. No extremity edema. Abdomen: no tenderness, no masses palpated.  Bowel sounds positive.  Musculoskeletal: no clubbing / cyanosis. No joint deformity upper and lower extremities. Normal muscle tone. Pain with palpation of the bilateral pelvic rim. Skin: no rashes, lesions, ulcers. No induration Neurologic: CN 2-12 grossly intact.Difficult recalling recent memory. Sensation intact. Strength 5/5 in all 4.  Psychiatric: Alert and oriented x 3. Normal mood.    Labs on Admission: I have personally reviewed following labs and imaging studies  CBC: Recent Labs  Lab 03/20/19 1629  WBC 12.3*  NEUTROABS 9.2*  HGB 13.9  HCT 42.0  MCV 91.9  PLT 349   Basic  Metabolic Panel: Recent Labs  Lab 03/20/19 1629  NA 136  K 4.1  CL 97*  CO2 27  GLUCOSE 144*  BUN 23  CREATININE 1.80*  CALCIUM 10.0   GFR: Estimated Creatinine Clearance: 25.8 mL/min (A) (by C-G formula based on SCr of 1.8 mg/dL (H)). Liver Function Tests: No results for input(s): AST, ALT, ALKPHOS, BILITOT, PROT, ALBUMIN in the last 168 hours. No results for input(s): LIPASE, AMYLASE in the last 168 hours. No results for input(s): AMMONIA in the last 168 hours. Coagulation Profile: No results for input(s): INR, PROTIME in the last 168 hours. Cardiac Enzymes: Recent Labs  Lab 03/20/19 1648  CKTOTAL 1,077*   BNP (last 3 results) No results for input(s): PROBNP in the last 8760 hours. HbA1C: No results for input(s): HGBA1C in the last 72 hours. CBG: No results for input(s): GLUCAP in the last 168 hours. Lipid Profile: No results for input(s): CHOL, HDL, LDLCALC, TRIG, CHOLHDL, LDLDIRECT in the last 72 hours. Thyroid Function Tests: Recent Labs    03/20/19 1629  TSH 65.928*  FREET4 0.60*   Anemia Panel: No results for input(s): VITAMINB12, FOLATE, FERRITIN, TIBC, IRON, RETICCTPCT in the last 72 hours. Urine analysis:    Component Value Date/Time   COLORURINE YELLOW (A) 03/20/2019 1653   APPEARANCEUR CLEAR (A) 03/20/2019 1653   APPEARANCEUR Clear 03/24/2013 1530   LABSPEC 1.013 03/20/2019 1653   LABSPEC 1.009 03/24/2013 1530   PHURINE 5.0 03/20/2019 1653   GLUCOSEU 150 (A) 03/20/2019 1653   GLUCOSEU 50 mg/dL 03/22/2019 03/22/2019   HGBUR NEGATIVE 03/20/2019 1653   BILIRUBINUR NEGATIVE 03/20/2019 1653   BILIRUBINUR Negative 03/24/2013 1530   KETONESUR NEGATIVE 03/20/2019 1653   PROTEINUR 30 (A) 03/20/2019 1653   NITRITE NEGATIVE 03/20/2019 1653   LEUKOCYTESUR NEGATIVE 03/20/2019 1653   LEUKOCYTESUR Negative 03/24/2013 1530    Radiological Exams on Admission: CT Head Wo Contrast  Result Date: 03/20/2019 CLINICAL DATA:  74 year old female with trauma. EXAM: CT  HEAD WITHOUT CONTRAST CT CERVICAL SPINE WITHOUT CONTRAST TECHNIQUE: Multidetector CT imaging of  the head and cervical spine was performed following the standard protocol without intravenous contrast. Multiplanar CT image reconstructions of the cervical spine were also generated. COMPARISON:  Head CT dated 03/10/2019. FINDINGS: CT HEAD FINDINGS Brain: Mild age-related atrophy and chronic microvascular ischemic changes. There is a small right parafalcine subdural hemorrhage measuring approximately 2 mm in thickness. No mass effect or midline shift. Small right lentiform nucleus old lacunar infarct noted. Vascular: No hyperdense vessel or unexpected calcification. Skull: Normal. Negative for fracture or focal lesion. Sinuses/Orbits: Mild mucoperiosteal thickening of paranasal sinuses. No air-fluid level. The mastoid air cells are clear. Other: Right posterior parietal scalp hematoma. CT CERVICAL SPINE FINDINGS Alignment: No acute subluxation. There is straightening of normal cervical lordosis which may be positional or due to muscle spasm. Skull base and vertebrae: Similar appearance of the fracture involving the left anterior inferior endplate of C6 as seen on the prior CT of 03/10/2019. No new fractures. The bones are osteopenic. There is incomplete bony fusion of the posterior ring of C1. Soft tissues and spinal canal: No prevertebral fluid or swelling. No visible canal hematoma. Disc levels: Multilevel degenerative changes. Partial ankylosis at C4-C5. Multilevel facet arthropathy. Upper chest: Negative. Other: Bilateral carotid bulb calcified plaques. IMPRESSION: 1. Small right parafalcine subdural hemorrhage measuring 2-3 mm in thickness. No mass effect or midline shift. 2. Nondisplaced fracture of the left anterior inferior endplate of C6 as seen on the prior CT. No new cervical spine fracture. These results were called by telephone at the time of interpretation on 03/20/2019 at 5:35 pm to provider Shaune Pollack  , who verbally acknowledged these results. Electronically Signed   By: Elgie Collard M.D.   On: 03/20/2019 17:40   CT Cervical Spine Wo Contrast  Result Date: 03/20/2019 CLINICAL DATA:  74 year old female with trauma. EXAM: CT HEAD WITHOUT CONTRAST CT CERVICAL SPINE WITHOUT CONTRAST TECHNIQUE: Multidetector CT imaging of the head and cervical spine was performed following the standard protocol without intravenous contrast. Multiplanar CT image reconstructions of the cervical spine were also generated. COMPARISON:  Head CT dated 03/10/2019. FINDINGS: CT HEAD FINDINGS Brain: Mild age-related atrophy and chronic microvascular ischemic changes. There is a small right parafalcine subdural hemorrhage measuring approximately 2 mm in thickness. No mass effect or midline shift. Small right lentiform nucleus old lacunar infarct noted. Vascular: No hyperdense vessel or unexpected calcification. Skull: Normal. Negative for fracture or focal lesion. Sinuses/Orbits: Mild mucoperiosteal thickening of paranasal sinuses. No air-fluid level. The mastoid air cells are clear. Other: Right posterior parietal scalp hematoma. CT CERVICAL SPINE FINDINGS Alignment: No acute subluxation. There is straightening of normal cervical lordosis which may be positional or due to muscle spasm. Skull base and vertebrae: Similar appearance of the fracture involving the left anterior inferior endplate of C6 as seen on the prior CT of 03/10/2019. No new fractures. The bones are osteopenic. There is incomplete bony fusion of the posterior ring of C1. Soft tissues and spinal canal: No prevertebral fluid or swelling. No visible canal hematoma. Disc levels: Multilevel degenerative changes. Partial ankylosis at C4-C5. Multilevel facet arthropathy. Upper chest: Negative. Other: Bilateral carotid bulb calcified plaques. IMPRESSION: 1. Small right parafalcine subdural hemorrhage measuring 2-3 mm in thickness. No mass effect or midline shift. 2.  Nondisplaced fracture of the left anterior inferior endplate of C6 as seen on the prior CT. No new cervical spine fracture. These results were called by telephone at the time of interpretation on 03/20/2019 at 5:35 pm to provider Shaune Pollack , who verbally  acknowledged these results. Electronically Signed   By: Anner Crete M.D.   On: 03/20/2019 17:40   DG Chest Portable 1 View  Result Date: 03/20/2019 CLINICAL DATA:  Fall EXAM: PORTABLE CHEST 1 VIEW COMPARISON:  03/10/2019 FINDINGS: Left pacer remains in place, unchanged. Heart is borderline in size. No confluent airspace opacities or effusions. No acute bony abnormality. IMPRESSION: No active disease. Electronically Signed   By: Rolm Baptise M.D.   On: 03/20/2019 17:49   DG HIP UNILAT WITH PELVIS 2-3 VIEWS RIGHT  Result Date: 03/20/2019 CLINICAL DATA:  Bilateral hip pain, unwitnessed fall EXAM: DG HIP (WITH OR WITHOUT PELVIS) 2-3V RIGHT COMPARISON:  06/29/2017 FINDINGS: Hip joints and SI joints are symmetric and unremarkable. No acute bony abnormality. Specifically, no fracture, subluxation, or dislocation. IMPRESSION: No acute bony abnormality. Electronically Signed   By: Rolm Baptise M.D.   On: 03/20/2019 17:50    EKG: Independently reviewed.   Assessment/Plan  Subdural hematoma secondary to mechanical fall EDP discussed with neurosurgery Dr. Marsa Aris who recommended repeat CT head in 6 hours Repeat CT showed stable hematoma  Frequent neurochecks  PT/OT Social work consult for placement. Pt is not safe at home with recurrent falls  Pain in pelvis region Obtain Pelvic X-ray   AKI  Creatinine of 1.80 from a prior of 1.05 Monitor after fluids Avoid nephrotoxic agents  Rhabdomyolysis CK of 1077  Continuous IV fluids  Leukocytosis Reactive. No signs of infection.   Hypothyroidism Levothyroxine was increased at last admission Continue current dose and will need outpatient follow up to recheck  Recent non-displaced C6  fracture Continue C-collar Will need follow up with neurosurgery outpatient Continue PRN oxycodone  CAD hold aspirin due to subdural hematoma  Hypertension continue amlodipine Hold losartan due to AKI  HLD continue fenofibrate and statin   Chronic diastolic heart failure appears euvolemia continue Lasix and metoprolol  Type 2 diabetes normally take 20U Lantus BG of 144 on admit. place on moderate SSI  Tremors continue gabapentin, primidone,benztropine  DVT prophylaxis:  Code Status: Full Family Communication: Plan discussed with patient at bedside  disposition Plan: Home with at least 2 midnight stays  Consults called:  Admission status: inpatient   DVT prophylaxis:SCD Code Status: FULL Family Communication: Plan discussed with patient at bedside  disposition Plan: Home with at least 2 midnight stays  Consults called: Nephrology Admission status: inpatient   Eileen Kangas T Elen Acero DO [facility]   If 7PM-7AM, please contact night-coverage [email or web address] Password [facility]  03/20/2019, 7:02 PM

## 2019-03-20 NOTE — ED Notes (Signed)
After speaking to patient's emergency contact [name]) pt has been staying at home with a home health nurse. Pt is alert to self, situation, time and place at baseline but per [name] is slightly "forgetfull" at baseline. When RN asked if he could give specific examples he stated, "Sometimes she will ask where her medications are when they are sitting right in front of her."   [name] clarified that her calling him Denis earlier tonight is a new type of confusion for her.

## 2019-03-20 NOTE — ED Notes (Signed)
Pt asking this RN to update "Melanie King" RN asked again who patient wanted to contact after noting Angelique Blonder was not a listed contact. Pt repeated "Melanie King" and then provided the number for Molly Maduro (pts emergency contact. Pt is otherwise alert to self, situation, time and place.

## 2019-03-20 NOTE — ED Notes (Signed)
Patient transported to CT 

## 2019-03-21 ENCOUNTER — Inpatient Hospital Stay: Payer: Medicare Other

## 2019-03-21 DIAGNOSIS — N179 Acute kidney failure, unspecified: Secondary | ICD-10-CM

## 2019-03-21 DIAGNOSIS — N189 Chronic kidney disease, unspecified: Secondary | ICD-10-CM

## 2019-03-21 LAB — BASIC METABOLIC PANEL
Anion gap: 9 (ref 5–15)
BUN: 17 mg/dL (ref 8–23)
CO2: 28 mmol/L (ref 22–32)
Calcium: 9.1 mg/dL (ref 8.9–10.3)
Chloride: 101 mmol/L (ref 98–111)
Creatinine, Ser: 1.25 mg/dL — ABNORMAL HIGH (ref 0.44–1.00)
GFR calc Af Amer: 49 mL/min — ABNORMAL LOW (ref >60)
GFR calc non Af Amer: 43 mL/min — ABNORMAL LOW (ref >60)
Glucose, Bld: 178 mg/dL — ABNORMAL HIGH (ref 70–99)
Potassium: 3.6 mmol/L (ref 3.5–5.1)
Sodium: 138 mmol/L (ref 135–145)

## 2019-03-21 LAB — CBC
HCT: 40.3 % (ref 36.0–46.0)
Hemoglobin: 13.5 g/dL (ref 12.0–15.0)
MCH: 30.7 pg (ref 26.0–34.0)
MCHC: 33.5 g/dL (ref 30.0–36.0)
MCV: 91.6 fL (ref 80.0–100.0)
Platelets: 350 10*3/uL (ref 150–400)
RBC: 4.4 MIL/uL (ref 3.87–5.11)
RDW: 15.2 % (ref 11.5–15.5)
WBC: 9.3 10*3/uL (ref 4.0–10.5)
nRBC: 0 % (ref 0.0–0.2)

## 2019-03-21 LAB — GLUCOSE, CAPILLARY
Glucose-Capillary: 187 mg/dL — ABNORMAL HIGH (ref 70–99)
Glucose-Capillary: 283 mg/dL — ABNORMAL HIGH (ref 70–99)
Glucose-Capillary: 299 mg/dL — ABNORMAL HIGH (ref 70–99)
Glucose-Capillary: 215 mg/dL — ABNORMAL HIGH (ref 70–99)

## 2019-03-21 LAB — BLOOD GAS, VENOUS
Acid-Base Excess: 4 mmol/L — ABNORMAL HIGH (ref 0.0–2.0)
Bicarbonate: 30.6 mmol/L — ABNORMAL HIGH (ref 20.0–28.0)
O2 Saturation: 12.2 %
Patient temperature: 37
pCO2, Ven: 53 mmHg (ref 44.0–60.0)
pH, Ven: 7.37 (ref 7.250–7.430)

## 2019-03-21 LAB — CK: Total CK: 689 U/L — ABNORMAL HIGH (ref 38–234)

## 2019-03-21 LAB — SARS CORONAVIRUS 2 (TAT 6-24 HRS): SARS Coronavirus 2: NEGATIVE

## 2019-03-21 MED ORDER — INSULIN ASPART 100 UNIT/ML ~~LOC~~ SOLN
0.0000 [IU] | Freq: Three times a day (TID) | SUBCUTANEOUS | Status: DC
  Administered 2019-03-21 (×2): 8 [IU] via SUBCUTANEOUS
  Administered 2019-03-21: 3 [IU] via SUBCUTANEOUS
  Administered 2019-03-22: 5 [IU] via SUBCUTANEOUS
  Administered 2019-03-22: 8 [IU] via SUBCUTANEOUS
  Administered 2019-03-22: 5 [IU] via SUBCUTANEOUS
  Filled 2019-03-21 (×6): qty 1

## 2019-03-21 MED ORDER — VITAMIN B-12 1000 MCG PO TABS
1000.0000 ug | ORAL_TABLET | Freq: Every day | ORAL | Status: DC
  Administered 2019-03-21 – 2019-03-22 (×2): 1000 ug via ORAL
  Filled 2019-03-21 (×2): qty 1

## 2019-03-21 MED ORDER — GABAPENTIN 300 MG PO CAPS
300.0000 mg | ORAL_CAPSULE | Freq: Every day | ORAL | Status: DC
  Administered 2019-03-21: 300 mg via ORAL
  Filled 2019-03-21 (×2): qty 1

## 2019-03-21 MED ORDER — ROSUVASTATIN CALCIUM 10 MG PO TABS
40.0000 mg | ORAL_TABLET | Freq: Every day | ORAL | Status: DC
  Administered 2019-03-21: 40 mg via ORAL
  Filled 2019-03-21 (×2): qty 4

## 2019-03-21 MED ORDER — LEVOTHYROXINE SODIUM 100 MCG PO TABS
100.0000 ug | ORAL_TABLET | Freq: Every day | ORAL | Status: DC
  Administered 2019-03-21: 100 ug via ORAL
  Filled 2019-03-21: qty 1

## 2019-03-21 MED ORDER — METOPROLOL TARTRATE 25 MG PO TABS
25.0000 mg | ORAL_TABLET | Freq: Two times a day (BID) | ORAL | Status: DC
  Administered 2019-03-21 – 2019-03-22 (×4): 25 mg via ORAL
  Filled 2019-03-21 (×4): qty 1

## 2019-03-21 MED ORDER — BREXPIPRAZOLE 1 MG PO TABS
1.0000 mg | ORAL_TABLET | Freq: Every day | ORAL | Status: DC
  Administered 2019-03-21: 1 mg via ORAL
  Filled 2019-03-21 (×4): qty 1

## 2019-03-21 MED ORDER — MONTELUKAST SODIUM 10 MG PO TABS
10.0000 mg | ORAL_TABLET | Freq: Every day | ORAL | Status: DC
  Administered 2019-03-21: 10 mg via ORAL
  Filled 2019-03-21 (×2): qty 1

## 2019-03-21 MED ORDER — FUROSEMIDE 20 MG PO TABS
20.0000 mg | ORAL_TABLET | Freq: Every day | ORAL | Status: DC
  Administered 2019-03-21 – 2019-03-22 (×2): 20 mg via ORAL
  Filled 2019-03-21 (×2): qty 1

## 2019-03-21 MED ORDER — AMLODIPINE BESYLATE 10 MG PO TABS
10.0000 mg | ORAL_TABLET | Freq: Every day | ORAL | Status: DC
  Administered 2019-03-21 – 2019-03-22 (×2): 10 mg via ORAL
  Filled 2019-03-21 (×2): qty 1

## 2019-03-21 MED ORDER — CALCITRIOL 0.25 MCG PO CAPS
0.2500 ug | ORAL_CAPSULE | Freq: Every day | ORAL | Status: DC
  Administered 2019-03-21 – 2019-03-22 (×2): 0.25 ug via ORAL
  Filled 2019-03-21 (×2): qty 1

## 2019-03-21 MED ORDER — FAMOTIDINE 20 MG PO TABS
10.0000 mg | ORAL_TABLET | Freq: Every day | ORAL | Status: DC
  Administered 2019-03-21 – 2019-03-22 (×2): 10 mg via ORAL
  Filled 2019-03-21 (×2): qty 1

## 2019-03-21 MED ORDER — PRIMIDONE 50 MG PO TABS
50.0000 mg | ORAL_TABLET | Freq: Two times a day (BID) | ORAL | Status: DC
  Administered 2019-03-21 – 2019-03-22 (×3): 50 mg via ORAL
  Filled 2019-03-21 (×6): qty 1

## 2019-03-21 MED ORDER — GABAPENTIN 100 MG PO CAPS: 100.0000 mg | ORAL_CAPSULE | ORAL | Status: DC

## 2019-03-21 MED ORDER — SODIUM CHLORIDE 0.9 % IV SOLN: INTRAVENOUS | Status: AC

## 2019-03-21 MED ORDER — FENOFIBRATE 54 MG PO TABS
54.0000 mg | ORAL_TABLET | Freq: Every day | ORAL | Status: DC
  Administered 2019-03-21 – 2019-03-22 (×2): 54 mg via ORAL
  Filled 2019-03-21 (×2): qty 1

## 2019-03-21 MED ORDER — LEVOTHYROXINE SODIUM 50 MCG PO TABS
150.0000 ug | ORAL_TABLET | Freq: Every day | ORAL | Status: DC
  Administered 2019-03-22: 150 ug via ORAL
  Filled 2019-03-21: qty 1

## 2019-03-21 MED ORDER — CITALOPRAM HYDROBROMIDE 20 MG PO TABS
20.0000 mg | ORAL_TABLET | Freq: Every day | ORAL | Status: DC
  Administered 2019-03-21 – 2019-03-22 (×2): 20 mg via ORAL
  Filled 2019-03-21 (×2): qty 1

## 2019-03-21 MED ORDER — GABAPENTIN 100 MG PO CAPS
100.0000 mg | ORAL_CAPSULE | Freq: Two times a day (BID) | ORAL | Status: DC
  Administered 2019-03-21 – 2019-03-22 (×4): 100 mg via ORAL
  Filled 2019-03-21 (×4): qty 1

## 2019-03-21 MED ORDER — OXYCODONE-ACETAMINOPHEN 5-325 MG PO TABS
1.0000 | ORAL_TABLET | Freq: Four times a day (QID) | ORAL | Status: DC | PRN
  Administered 2019-03-21 – 2019-03-22 (×5): 1 via ORAL
  Filled 2019-03-21 (×5): qty 1

## 2019-03-21 MED ORDER — BENZTROPINE MESYLATE 0.5 MG PO TABS
0.5000 mg | ORAL_TABLET | Freq: Two times a day (BID) | ORAL | Status: DC
  Administered 2019-03-21 – 2019-03-22 (×3): 0.5 mg via ORAL
  Filled 2019-03-21 (×6): qty 1

## 2019-03-21 NOTE — Evaluation (Signed)
Physical Therapy Evaluation Patient Details Name: Melanie King MRN: [id number] DOB: 27-Jan-1946 Today's Date: 03/21/2019   History of Present Illness  73yo female here after unwitnessed fall at home. Pt admitted last week after fall, neck CT concerning for C6 facture. Neurosurgery placed in cervical collar at all times. PMHx includes arthritis, CHF, coronary arteriosclerosis, DDD (cervical, lumbar), depression, DM, GERD, HLD, HTN, OA, and pacemaker.   Clinical Impression  Pt pleasant t/o PT exam and gait training but was focused on finding her hearing aide (PT looked in multiple places unsuccessfully, nursing notified) and kept complaining of poor hearing.  Regardless of this she was not able to answer most questions about home situation, assist (and availability) but it continues to appear that pt is home alone regularly. Pt knew month and year and vaguely that she was in the hospital but was generally disoriented and did not appear as clear as she was on PT eval ~10 days ago.  Pt needed assist with supine to sit (per OT) as well as direct physical assist to rise to standing from recliner.  Pt did not have any overt LOBs with walker use during ~60 ft of ambulation but fatigued quickly (vitals appropriate post ambulation) and was reliant on the walker (apparently she does not typically use one and had had regular falls over the last year).  Pt wanting to go home, however she is not safe to do so at this point and PT is recommending STR to work back to a more independent and functional status.    Follow Up Recommendations SNF    Equipment Recommendations  None recommended by PT    Recommendations for Other Services       Precautions / Restrictions Precautions Precautions: Fall;Cervical Precaution Booklet Issued: No Precaution Comments: cervical collar at all times Required Braces or Orthoses: Cervical Brace Restrictions Weight Bearing Restrictions: No      Mobility  Bed Mobility                General bed mobility comments: Pt was in recliner on arrival, per OT needed mod assist to get to sitting EOB  Transfers Overall transfer level: Needs assistance Equipment used: Rolling walker (2 wheeled) Transfers: Sit to/from Stand Sit to Stand: Min assist         General transfer comment: Cued pt to try to rise from recliner (using hand rests, etc) but on multiple attempts she was unable to rise on her own.  Physical assist needed assist to get to standing.  Ambulation/Gait Ambulation/Gait assistance: Min guard;Min assist Gait Distance (Feet): 60 Feet Assistive device: Rolling walker (2 wheeled)       General Gait Details: Pt with cautious ambulation and was relatively quick to fatigue but she did show reasonably consistent cadence and with reliance on walker did not have any overt LOBs.   Stairs            Wheelchair Mobility    Modified Rankin (Stroke Patients Only)       Balance Overall balance assessment: Needs assistance;History of Falls Sitting-balance support: Feet supported;Bilateral upper extremity supported Sitting balance-Leahy Scale: Good Sitting balance - Comments: Pt in recliner on arrival, struggled to lean body forward but once in position held balance well   Standing balance support: Bilateral upper extremity supported Standing balance-Leahy Scale: Fair                               Pertinent  Vitals/Pain Pain Assessment: No/denies pain Pain Score: 1     Home Living Family/patient expects to be discharged to:: Private residence Living Arrangements: Alone(Pt reports friends live with her, this does not appear to be) Available Help at Discharge: Friend(s);Available PRN/intermittently Type of Home: Apartment Home Access: Stairs to enter Entrance Stairs-Rails: Can reach both Entrance Stairs-Number of Steps: 1 Home Layout: One level   Additional Comments: Poor historian and very HOH. Reports she has several friends  who assist at home (they work?) Pt unable to give clear answers even with multiple attempts/cuing.    Prior Function Level of Independence: Needs assistance   Gait / Transfers Assistance Needed: per pt, pt ambulating without AD but endorses poor balance and repeat falls  ADL's / Homemaking Assistance Needed: per recent PT note, pt requires assist from friend for bathing, dressing, and toileting, but when friend Rosey Bath isn't there she does "try really hard" to do it herself; reports [employer] and does some cleaning and provides pt with transportation; pt reports managing medications on her own  Comments: Pt appears less clear of mind and able to answer questions this date as compared to PT exam last week     Hand Dominance        Extremity/Trunk Assessment   Upper Extremity Assessment Upper Extremity Assessment: Generalized weakness    Lower Extremity Assessment Lower Extremity Assessment: Overall WFL for tasks assessed;Generalized weakness    Cervical / Trunk Assessment Cervical / Trunk Exceptions: possible C6 fracture, to wear collar at all times  Communication   Communication: HOH  Cognition Arousal/Alertness: Awake/alert Behavior During Therapy: WFL for tasks assessed/performed Overall Cognitive Status: Within Functional Limits for tasks assessed                                        General Comments      Exercises     Assessment/Plan    PT Assessment Patient needs continued PT services  PT Problem List Decreased strength;Decreased balance;Decreased mobility;Decreased knowledge of use of DME;Decreased safety awareness;Pain;Decreased cognition;Decreased activity tolerance       PT Treatment Interventions DME instruction;Gait training;Stair training;Therapeutic activities;Therapeutic exercise;Balance training    PT Goals (Current goals can be found in the Care Plan section)  Acute Rehab PT Goals Patient Stated Goal: go home PT Goal Formulation:  With patient Time For Goal Achievement: 04/04/19 Potential to Achieve Goals: Good    Frequency Min 2X/week   Barriers to discharge Decreased caregiver support      Co-evaluation               AM-PAC PT "6 Clicks" Mobility  Outcome Measure Help needed turning from your back to your side while in a flat bed without using bedrails?: A Lot Help needed moving from lying on your back to sitting on the side of a flat bed without using bedrails?: A Lot Help needed moving to and from a bed to a chair (including a wheelchair)?: A Little Help needed standing up from a chair using your arms (e.g., wheelchair or bedside chair)?: A Little Help needed to walk in hospital room?: A Little Help needed climbing 3-5 steps with a railing? : A Lot 6 Click Score: 15    End of Session Equipment Utilized During Treatment: Gait belt Activity Tolerance: Patient tolerated treatment well Patient left: in chair;with chair alarm set Nurse Communication: Mobility status PT Visit Diagnosis: Repeated falls (R29.6);Muscle  weakness (generalized) (M62.81);History of falling (Z91.81);Difficulty in walking, not elsewhere classified (R26.2)    Time: 9597-4718 PT Time Calculation (min) (ACUTE ONLY): 21 min   Charges:   PT Evaluation $PT Eval Low Complexity: 1 Low PT Treatments $Gait Training: 8-22 mins        Kreg Shropshire, DPT 03/21/2019, 9:49 AM

## 2019-03-21 NOTE — Progress Notes (Signed)
   Patient arrived on unit not oriented, declines to respond to admission questions, only verbalizing in moans. This RN attempted to administer PO medications but patient's swallowing ability compromised in current condition. Provider made aware of new condition and elevated SBP. Patient uncooperative with neuro checks, weaknesses noted to all extremities, PERRLA. Hard neck collar in place.

## 2019-03-21 NOTE — NC FL2 (Signed)
[place] LEVEL OF CARE SCREENING TOOL     IDENTIFICATION  Patient Name: Melanie King Birthdate: 1945-06-29 Sex: female Admission Date (Current Location): 03/20/2019  [place] and [place] Number:  Engineering geologist and Address:  [facility], [address] [place], [place] [address]      Provider Number: [id number]  Attending Physician Name and Address:  Fritzi Mandes, MD  Relative Name and Phone Number:       Current Level of Care: Hospital Recommended Level of Care: [place] Prior Approval Number:    Date Approved/Denied:   PASRR Number: [id number]  Discharge Plan: SNF    Current Diagnoses: Patient Active Problem List   Diagnosis Date Noted  . AKI (acute kidney injury) ([place]) 03/21/2019  . Subdural hematoma ([place]) 03/20/2019  . Hypothyroidism 03/10/2019  . Depression   . Chronic diastolic CHF (congestive heart failure) ([place])   . Acute metabolic encephalopathy   . Fall   . Tobacco abuse   . C6 cervical fracture ([place])   . Acute delirium 05/12/2017  . Seizure ([place]) 05/10/2017  . Elevated sedimentation rate 04/04/2017  . Elevated C-reactive protein (CRP) 04/04/2017  . Chest pain, unspecified 04/02/2017  . Bilateral lower extremity pain (Secondary Area of Pain) (R>L) 04/02/2017  . Chronic hip pain, bilateral  (Secondary Area of Pain) (R>L) 04/02/2017  . Chronic bilateral low back pain with bilateral sciatica [facility] Area of Pain) (R>L) 04/02/2017  . Chronic pain syndrome 04/02/2017  . Disorder of bone, unspecified 04/02/2017  . Other long term (current) drug therapy 04/02/2017  . Other specified health status 04/02/2017  . Long term current use of opiate analgesic 04/02/2017  . Hip pain, bilateral 12/06/2016  . Tremor 05/27/2016  . Trigger finger of left hand 02/16/2016  . Osteoarthritis of both hands 02/16/2016  . Angina pectoris ([place]) 01/17/2016  . COPD (chronic obstructive pulmonary disease) ([place])  01/17/2016  . CAD (coronary artery disease) 01/17/2016  . HLD (hyperlipidemia) 01/17/2016  . Hypertension 01/17/2016  . Sick sinus syndrome ([place]) 01/17/2016  . Chronic pain 01/17/2016  . Personal history of tobacco use, presenting hazards to health 11/16/2015  . Type II diabetes mellitus with renal manifestations ([place]) 05/12/2013    Orientation RESPIRATION BLADDER Height & Weight     Self, Time, Situation, Place  Normal Incontinent Weight: 66.2 kg Height:  5\' 3"  (160 cm)  BEHAVIORAL SYMPTOMS/MOOD NEUROLOGICAL BOWEL NUTRITION STATUS      Continent Diet(Regular)  AMBULATORY STATUS COMMUNICATION OF NEEDS Skin   Extensive Assist Verbally Normal                       Personal Care Assistance Level of Assistance  Bathing, Feeding, Dressing Bathing Assistance: Limited assistance Feeding assistance: Limited assistance Dressing Assistance: Limited assistance     Functional Limitations Info  Sight, Hearing Sight Info: Adequate Hearing Info: Adequate      SPECIAL CARE FACTORS FREQUENCY  PT (By licensed PT), OT (By licensed OT)     PT Frequency: 5x a week OT Frequency: 5x a week            Contractures Contractures Info: Not present    Additional Factors Info  Code Status, Allergies Code Status Info: Full Allergies Info: Alprazolam, Fentanyl, Meperidine, Ranitidine Hcl           Current Medications (03/21/2019):  This is the current hospital active medication list Current Facility-Administered Medications  Medication Dose Route Frequency Provider Last Rate Last  Admin  . amLODipine (NORVASC) tablet 10 mg  10 mg Oral Daily Tu, Ching T, DO   10 mg at 03/21/19 0926  . benztropine (COGENTIN) tablet 0.5 mg  0.5 mg Oral BID Tu, Ching T, DO   0.5 mg at 03/21/19 0926  . brexpiprazole (REXULTI) tablet 1 mg  1 mg Oral QHS Tu, Ching T, DO      . calcitRIOL (ROCALTROL) capsule 0.25 mcg  0.25 mcg Oral Daily Tu, Ching T, DO   0.25 mcg at 03/21/19 0927  . citalopram (CELEXA)  tablet 20 mg  20 mg Oral Daily Tu, Ching T, DO   20 mg at 03/21/19 0926  . famotidine (PEPCID) tablet 10 mg  10 mg Oral Daily Tu, Ching T, DO   10 mg at 03/21/19 0926  . fenofibrate tablet 54 mg  54 mg Oral Daily Tu, Ching T, DO   54 mg at 03/21/19 0926  . furosemide (LASIX) tablet 20 mg  20 mg Oral Daily Tu, Ching T, DO   20 mg at 03/21/19 0926  . gabapentin (NEURONTIN) capsule 100 mg  100 mg Oral BID Tu, Ching T, DO   100 mg at 03/21/19 0500   And  . gabapentin (NEURONTIN) capsule 300 mg  300 mg Oral QHS Tu, Ching T, DO      . insulin aspart (novoLOG) injection 0-15 Units  0-15 Units Subcutaneous TID WC Tu, Ching T, DO   3 Units at 03/21/19 0925  . levothyroxine (SYNTHROID) tablet 100 mcg  100 mcg Oral QAC breakfast Tu, Ching T, DO   100 mcg at 03/21/19 0926  . metoprolol tartrate (LOPRESSOR) tablet 25 mg  25 mg Oral BID Tu, Ching T, DO   25 mg at 03/21/19 0926  . montelukast (SINGULAIR) tablet 10 mg  10 mg Oral QHS Tu, Ching T, DO      . oxyCODONE-acetaminophen (PERCOCET/ROXICET) 5-325 MG per tablet 1 tablet  1 tablet Oral Q6H PRN Tu, Ching T, DO   1 tablet at 03/21/19 0400  . primidone (MYSOLINE) tablet 50 mg  50 mg Oral BID Tu, Ching T, DO   50 mg at 03/21/19 0926  . rosuvastatin (CRESTOR) tablet 40 mg  40 mg Oral QHS Tu, Ching T, DO      . vitamin B-12 (CYANOCOBALAMIN) tablet 1,000 mcg  1,000 mcg Oral Daily Tu, Ching T, DO   1,000 mcg at 03/21/19 3976     Discharge Medications: Please see discharge summary for a list of discharge medications.  Relevant Imaging Results:  Relevant Lab Results:   Additional Information ss: [id number]  Shawn Route, RN

## 2019-03-21 NOTE — TOC Initial Note (Signed)
Transition of Care [facility]) - Initial/Assessment Note    Patient Details  Name: Melanie King MRN: [id number] Date of Birth: 1945/12/06  [facility]) CM/SW Contact:    Victorino Dike, RN Phone Number: 03/21/2019, 9:44 AM  Clinical Narrative:                  Met with patient and discussed continued care with short term rehab at a skilled nursing facility.  Patient and significant other are agreeable.  Permission granted to start bed search.  DME in home:  [employer] and [employer].  Expected Discharge Plan: Assisted Living Barriers to Discharge: Continued Medical Work up   Patient Goals and CMS Choice Patient states their goals for this hospitalization and ongoing recovery are:: To return after rehab therapy   Choice offered to / list presented to : Patient  Expected Discharge Plan and Services Expected Discharge Plan: Assisted Living   Discharge Planning Services: CM Consult Post Acute Care Choice: [place] Living arrangements for the past 2 months: Apartment                 DME Arranged: N/A DME Agency: NA                  Prior Living Arrangements/Services Living arrangements for the past 2 months: Apartment Lives with:: Significant Other Patient language and need for interpreter reviewed:: Yes Do you feel safe going back to the place where you live?: Yes      Need for Family Participation in Patient Care: No (Comment) Care giver support system in place?: Yes (comment) Current home services: DME((Rolling Walker, Bedside [employer])) Criminal Activity/Legal Involvement Pertinent to Current Situation/Hospitalization: No - Comment as needed  Activities of Daily Living   ADL Screening (condition at time of admission) Does the patient have difficulty concentrating, remembering, or making decisions?: Yes  Permission Sought/Granted Permission sought to share information with : Other (comment) Permission granted to share  information with : Yes, Release of Information Signed  Share Information with NAME: Lauretta Grill           Emotional Assessment   Attitude/Demeanor/Rapport: Engaged Affect (typically observed): Appropriate Orientation: : Oriented to Self, Oriented to Place, Oriented to  Time, Oriented to Situation Alcohol / Substance Use: Not Applicable Psych Involvement: No (comment)  Admission diagnosis:  Subdural hematoma ([place]) [S06.5X9A] Hip pain [M25.559] AKI (acute kidney injury) ([place]) [N17.9] Fall, initial encounter [W19.XXXA] Patient Active Problem List   Diagnosis Date Noted  . AKI (acute kidney injury) ([place]) 03/21/2019  . Subdural hematoma ([place]) 03/20/2019  . Hypothyroidism 03/10/2019  . Depression   . Chronic diastolic CHF (congestive heart failure) ([place])   . Acute metabolic encephalopathy   . Fall   . Tobacco abuse   . C6 cervical fracture ([place])   . Acute delirium 05/12/2017  . Seizure ([place]) 05/10/2017  . Elevated sedimentation rate 04/04/2017  . Elevated C-reactive protein (CRP) 04/04/2017  . Chest pain, unspecified 04/02/2017  . Bilateral lower extremity pain (Secondary Area of Pain) (R>L) 04/02/2017  . Chronic hip pain, bilateral  (Secondary Area of Pain) (R>L) 04/02/2017  . Chronic bilateral low back pain with bilateral sciatica [facility] Area of Pain) (R>L) 04/02/2017  . Chronic pain syndrome 04/02/2017  . Disorder of bone, unspecified 04/02/2017  . Other long term (current) drug therapy 04/02/2017  . Other specified health status 04/02/2017  . Long term current use of opiate analgesic 04/02/2017  . Hip pain, bilateral 12/06/2016  .  Tremor 05/27/2016  . Trigger finger of left hand 02/16/2016  . Osteoarthritis of both hands 02/16/2016  . Angina pectoris ([place]) 01/17/2016  . COPD (chronic obstructive pulmonary disease) ([place]) 01/17/2016  . CAD (coronary artery disease) 01/17/2016  . HLD (hyperlipidemia) 01/17/2016  . Hypertension 01/17/2016  . Sick sinus syndrome ([place])  01/17/2016  . Chronic pain 01/17/2016  . Personal history of tobacco use, presenting hazards to health 11/16/2015  . Type II diabetes mellitus with renal manifestations ([place]) 05/12/2013   PCP:  Patient, No Pcp Per Pharmacy:   [facility], [place] [address] [place] [address] Phone: [phone] Fax: [phone]  [id number] [place] [address] [place]: [phone]: [address]Phone: [phone]: [phone]    Social Determinants of Health (SDOH) Interventions    Readmission Risk Interventions No flowsheet data found.

## 2019-03-21 NOTE — Progress Notes (Signed)
[facility]  - [place] at [facility]   PATIENT NAME: Melanie King    MR#:  [id number]  DATE OF BIRTH:  [birth year]  SUBJECTIVE:   Patient came in after she had a mechanical fall at home. She reports standing the other table turn around lost balance fell.  Recently admitted  & discharged after subdural hematoma to home. Denies any complaints. She is out in the recliner. REVIEW OF SYSTEMS:   Review of Systems  Constitutional: Negative for chills, fever and weight loss.  HENT: Negative for ear discharge, ear pain and nosebleeds.   Eyes: Negative for blurred vision, pain and discharge.  Respiratory: Negative for sputum production, shortness of breath, wheezing and stridor.   Cardiovascular: Negative for chest pain, palpitations, orthopnea and PND.  Gastrointestinal: Negative for abdominal pain, diarrhea, nausea and vomiting.  Genitourinary: Negative for frequency and urgency.  Musculoskeletal: Positive for neck pain. Negative for back pain and joint pain.  Neurological: Positive for weakness. Negative for sensory change, speech change and focal weakness.  Psychiatric/Behavioral: Negative for depression and hallucinations. The patient is not nervous/anxious.    Tolerating Diet: yes Tolerating PT: Rehab  DRUG ALLERGIES:   Allergies  Allergen Reactions  . Alprazolam Swelling  . Fentanyl Other (See Comments)    "burning and hot"  . Meperidine Itching    Other reaction(s): Other (See Comments)  . Ranitidine Hcl     Other reaction(s): Other (See Comments)    VITALS:  Blood pressure (!) 148/70, pulse 69, temperature 98.1 F (36.7 C), temperature source Oral, resp. rate 18, height 5\' 3"  (1.6 m), weight 66.2 kg, SpO2 90 %.  PHYSICAL EXAMINATION:   Physical Exam  GENERAL:  74 y.o.-year-old patient lying in the bed with no acute distress.  EYES: Pupils equal, round, reactive to light and accommodation. No scleral icterus. Extraocular muscles intact.  HEENT: Head  atraumatic, normocephalic. Oropharynx and nasopharynx clear.  NECK:  Supple, no jugular venous distention. No thyroid enlargement, no tenderness. Neck collar + LUNGS: Normal breath sounds bilaterally, no wheezing, rales, rhonchi. No use of accessory muscles of respiration.  CARDIOVASCULAR: S1, S2 normal. No murmurs, rubs, or gallops.  ABDOMEN: Soft, nontender, nondistended. Bowel sounds present. No organomegaly or mass.  EXTREMITIES: No cyanosis, clubbing or edema b/l.    NEUROLOGIC: Cranial nerves II through XII are intact. No focal Motor or sensory deficits b/l.   PSYCHIATRIC:  patient is alert and oriented x 3.  SKIN: No obvious rash, lesion, or ulcer.   LABORATORY PANEL:  CBC Recent Labs  Lab 03/21/19 0807  WBC 9.3  HGB 13.5  HCT 40.3  PLT 350    Chemistries  Recent Labs  Lab 03/21/19 0807  NA 138  K 3.6  CL 101  CO2 28  GLUCOSE 178*  BUN 17  CREATININE 1.25*  CALCIUM 9.1   Cardiac Enzymes No results for input(s): TROPONINI in the last 168 hours. RADIOLOGY:  DG Pelvis 1-2 Views  Result Date: 03/21/2019 CLINICAL DATA:  Initial evaluation for acute pelvic pain. EXAM: PELVIS - 1-2 VIEW COMPARISON:  Prior radiograph from 04/02/2017. FINDINGS: No acute fracture or dislocation. Bony pelvis intact. Mild osteoarthritic changes noted about the SI joints bilaterally, slightly worse on the right. Mild degenerative change noted within the lower lumbar spine. Visualized soft tissues demonstrate no acute finding. Visualized bowel gas pattern is nonobstructive. Prominent aorto bi-iliac atherosclerotic disease noted. IMPRESSION: 1. Mild osteoarthritic changes about the bilateral sacroiliac joints, with mild degenerative spondylosis within the lower lumbar spine.  2. Nonobstructive bowel gas pattern. No other acute abnormality about the pelvis. 3. Prominent aorto bi-iliac atherosclerotic disease. Electronically Signed   By: Rise Mu M.D.   On: 03/21/2019 02:48   CT HEAD WO  CONTRAST  Result Date: 03/20/2019 CLINICAL DATA:  Subdural hematoma, unwitnessed fall, known C6 fracture EXAM: CT HEAD WITHOUT CONTRAST TECHNIQUE: Contiguous axial images were obtained from the base of the skull through the vertex without intravenous contrast. COMPARISON:  CT head 03/20/2019 FINDINGS: Brain: Stable appearance of a 2 mm right parafalcine subdural hematoma. No significant interval expansion. No new areas of hemorrhage. No CT evidence of acute large territory infarct. No mass effect or midline shift. Symmetric prominence of the ventricles, cisterns and sulci compatible with parenchymal volume loss. Patchy areas of white matter hypoattenuation are most compatible with chronic microvascular angiopathy. Vascular: Atherosclerotic calcification of the carotid siphons and intradural vertebral arteries. No hyperdense vessel. Skull: Diminished soft tissue swelling and scalp thickening with a right posterior parietal scalp hematoma. No subjacent calvarial fracture. No new areas of scalp swelling or suspicious osseous lesions. Sinuses/Orbits: Paranasal sinuses and mastoid air cells are predominantly clear. Other: None IMPRESSION: 1. Stable appearance of a 2 mm right parafalcine subdural hematoma. 2. No new sites of hemorrhage or other acute intracranial abnormality. 3. Diminished soft tissue swelling and scalp thickening with a right posterior parietal scalp hematoma. 4. Background of chronic microvascular angiopathy and parenchymal volume loss. Electronically Signed   By: Kreg Shropshire M.D.   On: 03/20/2019 23:41   CT Head Wo Contrast  Result Date: 03/20/2019 CLINICAL DATA:  74 year old female with trauma. EXAM: CT HEAD WITHOUT CONTRAST CT CERVICAL SPINE WITHOUT CONTRAST TECHNIQUE: Multidetector CT imaging of the head and cervical spine was performed following the standard protocol without intravenous contrast. Multiplanar CT image reconstructions of the cervical spine were also generated. COMPARISON:   Head CT dated 03/10/2019. FINDINGS: CT HEAD FINDINGS Brain: Mild age-related atrophy and chronic microvascular ischemic changes. There is a small right parafalcine subdural hemorrhage measuring approximately 2 mm in thickness. No mass effect or midline shift. Small right lentiform nucleus old lacunar infarct noted. Vascular: No hyperdense vessel or unexpected calcification. Skull: Normal. Negative for fracture or focal lesion. Sinuses/Orbits: Mild mucoperiosteal thickening of paranasal sinuses. No air-fluid level. The mastoid air cells are clear. Other: Right posterior parietal scalp hematoma. CT CERVICAL SPINE FINDINGS Alignment: No acute subluxation. There is straightening of normal cervical lordosis which may be positional or due to muscle spasm. Skull base and vertebrae: Similar appearance of the fracture involving the left anterior inferior endplate of C6 as seen on the prior CT of 03/10/2019. No new fractures. The bones are osteopenic. There is incomplete bony fusion of the posterior ring of C1. Soft tissues and spinal canal: No prevertebral fluid or swelling. No visible canal hematoma. Disc levels: Multilevel degenerative changes. Partial ankylosis at C4-C5. Multilevel facet arthropathy. Upper chest: Negative. Other: Bilateral carotid bulb calcified plaques. IMPRESSION: 1. Small right parafalcine subdural hemorrhage measuring 2-3 mm in thickness. No mass effect or midline shift. 2. Nondisplaced fracture of the left anterior inferior endplate of C6 as seen on the prior CT. No new cervical spine fracture. These results were called by telephone at the time of interpretation on 03/20/2019 at 5:35 pm to provider Shaune Pollack , who verbally acknowledged these results. Electronically Signed   By: Elgie Collard M.D.   On: 03/20/2019 17:40   CT Cervical Spine Wo Contrast  Result Date: 03/20/2019 CLINICAL DATA:  74 year old female with  trauma. EXAM: CT HEAD WITHOUT CONTRAST CT CERVICAL SPINE WITHOUT CONTRAST  TECHNIQUE: Multidetector CT imaging of the head and cervical spine was performed following the standard protocol without intravenous contrast. Multiplanar CT image reconstructions of the cervical spine were also generated. COMPARISON:  Head CT dated 03/10/2019. FINDINGS: CT HEAD FINDINGS Brain: Mild age-related atrophy and chronic microvascular ischemic changes. There is a small right parafalcine subdural hemorrhage measuring approximately 2 mm in thickness. No mass effect or midline shift. Small right lentiform nucleus old lacunar infarct noted. Vascular: No hyperdense vessel or unexpected calcification. Skull: Normal. Negative for fracture or focal lesion. Sinuses/Orbits: Mild mucoperiosteal thickening of paranasal sinuses. No air-fluid level. The mastoid air cells are clear. Other: Right posterior parietal scalp hematoma. CT CERVICAL SPINE FINDINGS Alignment: No acute subluxation. There is straightening of normal cervical lordosis which may be positional or due to muscle spasm. Skull base and vertebrae: Similar appearance of the fracture involving the left anterior inferior endplate of C6 as seen on the prior CT of 03/10/2019. No new fractures. The bones are osteopenic. There is incomplete bony fusion of the posterior ring of C1. Soft tissues and spinal canal: No prevertebral fluid or swelling. No visible canal hematoma. Disc levels: Multilevel degenerative changes. Partial ankylosis at C4-C5. Multilevel facet arthropathy. Upper chest: Negative. Other: Bilateral carotid bulb calcified plaques. IMPRESSION: 1. Small right parafalcine subdural hemorrhage measuring 2-3 mm in thickness. No mass effect or midline shift. 2. Nondisplaced fracture of the left anterior inferior endplate of C6 as seen on the prior CT. No new cervical spine fracture. These results were called by telephone at the time of interpretation on 03/20/2019 at 5:35 pm to provider Shaune Pollack , who verbally acknowledged these results. Electronically  Signed   By: Elgie Collard M.D.   On: 03/20/2019 17:40   DG Chest Portable 1 View  Result Date: 03/20/2019 CLINICAL DATA:  Fall EXAM: PORTABLE CHEST 1 VIEW COMPARISON:  03/10/2019 FINDINGS: Left pacer remains in place, unchanged. Heart is borderline in size. No confluent airspace opacities or effusions. No acute bony abnormality. IMPRESSION: No active disease. Electronically Signed   By: Charlett Nose M.D.   On: 03/20/2019 17:49   DG HIP UNILAT WITH PELVIS 2-3 VIEWS RIGHT  Result Date: 03/20/2019 CLINICAL DATA:  Bilateral hip pain, unwitnessed fall EXAM: DG HIP (WITH OR WITHOUT PELVIS) 2-3V RIGHT COMPARISON:  06/29/2017 FINDINGS: Hip joints and SI joints are symmetric and unremarkable. No acute bony abnormality. Specifically, no fracture, subluxation, or dislocation. IMPRESSION: No acute bony abnormality. Electronically Signed   By: Charlett Nose M.D.   On: 03/20/2019 17:50   ASSESSMENT AND PLAN:  Melanie F Bonneville is a 74 y.o. female with medical history significant of chronic diastolic heart failure, sick sinus syndrome s/p pacemaker, CAD, COPD, Type 2 diabetes, hypothyroidism and chronic pain who presents with recurrent falls.  She was just discharged yesterday with non-operable C6 cervical fracture and wearing C-collar following a fall.   #Subdural hematoma secondary to mechanical fall -EDP discussed with neurosurgery Dr. Glendell Docker who recommended repeat CT head in 6 hours -Repeat CT showed stable hematoma  -Frequent neurochecks  -PT/OT--recommends rehab -Social work consult for placement. Pt is not safe at home with recurrent falls  #Pain in pelvis region Pelvic X-ray -- no fracture  # mild AKI  Creatinine of 1.80 from a prior of 1.05 Monitor after fluids Avoid nephrotoxic agents  # Acute Rhabdomyolysis CK of 1077 --600 received IV fluids and encourage oral fluids  #Hypothyroidism -Levothyroxine was increased  at last admission -Continue current dose and will need outpatient  follow up to recheck  #Recent non-displaced C6 fracture -Continue C-collar -Will need follow up with neurosurgery outpatient--pt has to f/u Dr Lacinda Axon -Continue PRN oxycodone  #CAD -hold aspirin due to subdural hematoma  # Hypertension continue amlodipine Hold losartan due to AKI  #HLD continue fenofibrate and statin   #Chronic diastolic heart failure appears euvolemia continue Lasix and metoprolol  #Type 2 diabetes, stable normally take 20U Lantus BG of 144 on admit. place on moderate SSI  #Tremors continue gabapentin, primidone,benztropine  DVT prophylaxis:  Code Status: Full Family Communication: Plan discussed with patient at bedside  disposition Plan:to rehab tomorrow Admission status: inpatient    TOTAL TIME TAKING CARE OF THIS PATIENT: *25* minutes.  >50% time spent on counselling and coordination of care  Note: This dictation was prepared with Dragon dictation along with smaller phrase technology. Any transcriptional errors that result from this process are unintentional.  Fritzi Mandes M.D on 03/21/2019 at 4:27 PM  Between 7am to 6pm - Pager - [phone]  After 6pm go to [email or web address]  [facility]   CC: Primary care physician; Patient, No Pcp PerPatient ID: [place] F Brewbaker, female   DOB: 1945-08-20, 74 y.o.   MRN: [id number]

## 2019-03-21 NOTE — TOC Progression Note (Signed)
Transition of Care [facility]) - Progression Note    Patient Details  Name: [place] F Bauernfeind MRN: [id number] Date of Birth: [birth year]  [facility] CM/SW Contact  Shawn Route, RN Phone Number: 03/21/2019, 3:32 PM  Clinical Narrative:     Bed offer accepted at [facility] in [place].  Called [employer] to initiate authorization.  Faxed clinicals to Clinical Intake at [phone].   Reference # [id number].   Patient can be admitted to [facility] on Saturday 03/22/19.     Expected Discharge Plan: Assisted Living Barriers to Discharge: Continued Medical Work up  Expected Discharge Plan and Services Expected Discharge Plan: Assisted Living   Discharge Planning Services: CM Consult Post Acute Care Choice: Skilled Nursing Facility Living arrangements for the past 2 months: Apartment                 DME Arranged: N/A DME Agency: NA                   Social Determinants of Health (SDOH) Interventions    Readmission Risk Interventions No flowsheet data found.

## 2019-03-21 NOTE — Evaluation (Signed)
Occupational Therapy Evaluation Patient Details Name: Melanie King MRN: [id number] DOB: 1945/03/23 Today's Date: 03/21/2019    History of Present Illness 73yo female here after unwitnessed fall at home. Pt admitted last week after fall, neck CT concerning for C6 facture. Neurosurgery placed in cervical collar at all times. PMHx includes arthritis, CHF, coronary arteriosclerosis, DDD (cervical, lumbar), depression, DM, GERD, HLD, HTN, OA, and pacemaker.    Clinical Impression   Pt seen for OT evaluation this date upon acute hospitalization following fall. Pt reports being mostly Indep with ADLs-some assist from a friend Melanie King), also reports performing fxl mobility with no AD, but does endorse falling. Pt is poor historian so unsure of her PLOF report. On this date, Pt requiring MOD A with most LB ADLs, MIN A with UB ADLs, MOD A for sup to sit, MIN A for sit to stand with RW, and MIN/MOD verbal/tactile cues throughout for sequencing/safety. Overall, SNF is likely safest and most prudent d/c disposition to improve strength and mobility-as it pertains to ADLs-given pt's fall history and unclear assistance available at home.    Follow Up Recommendations  SNF    Equipment Recommendations  Other (comment)(defer to next level of care)    Recommendations for Other Services       Precautions / Restrictions Precautions Precautions: Fall;Cervical Precaution Booklet Issued: No Precaution Comments: cervical collar at all times Required Braces or Orthoses: Cervical Brace Cervical Brace: At all times;Hard collar Restrictions Weight Bearing Restrictions: No      Mobility Bed Mobility Overal bed mobility: Needs Assistance Bed Mobility: Supine to Sit     Supine to sit: Mod assist     General bed mobility comments: MOD A potentially r/t to delayed processing of cues which did appear to improve some once pt sitting up.  Transfers Overall transfer level: Needs assistance Equipment used:  Rolling walker (2 wheeled) Transfers: Sit to/from Stand Sit to Stand: Min assist         General transfer comment: MIN verbal cues for hand placement relative to RW (push from bed/chair versus pull up on RW)    Balance Overall balance assessment: Needs assistance;History of Falls Sitting-balance support: Feet supported;Bilateral upper extremity supported Sitting balance-Leahy Scale: Good Sitting balance - Comments: Pt in recliner on arrival, struggled to lean body forward but once in position held balance well   Standing balance support: Bilateral upper extremity supported Standing balance-Leahy Scale: Fair Standing balance comment: use of RW and CGA for safety                           ADL either performed or assessed with clinical judgement   ADL Overall ADL's : Needs assistance/impaired Eating/Feeding: Set up   Grooming: Wash/dry face;Minimal assistance;Sitting;Set up   Upper Body Bathing: Set up;Sitting   Lower Body Bathing: Moderate assistance;Maximal assistance;Sitting/lateral leans   Upper Body Dressing : Set up;Sitting   Lower Body Dressing: Moderate assistance;Maximal assistance;Sit to/from stand   Toilet Transfer: Minimal assistance;Moderate assistance;BSC;Stand-pivot   Toileting- Clothing Manipulation and Hygiene: Minimal assistance;Moderate assistance;Sit to/from stand       Functional mobility during ADLs: Minimal assistance;Rolling walker       Vision Patient Visual Report: No change from baseline       Perception     Praxis      Pertinent Vitals/Pain Pain Assessment: Faces Faces Pain Scale: Hurts a little bit Pain Location: slight grimace and rubs neck area with mobilization Pain Descriptors / Indicators:  Aching;Grimacing;Guarding Pain Intervention(s): Monitored during session;Repositioned     Hand Dominance Right   Extremity/Trunk Assessment Upper Extremity Assessment Upper Extremity Assessment: Generalized weakness   Lower  Extremity Assessment Lower Extremity Assessment: Defer to PT evaluation;Generalized weakness   Cervical / Trunk Assessment Cervical / Trunk Exceptions: possible C6 fracture, to wear collar at all times   Communication Communication Communication: HOH   Cognition Arousal/Alertness: Awake/alert Behavior During Therapy: WFL for tasks assessed/performed Overall Cognitive Status: Within Functional Limits for tasks assessed                                 General Comments: Pt A&O (month, year, situation, place, person), but some impulsivity, decreased safety awareness, requires multi-modal cues to sequence use of RW and steps. Potentailly r/t HOH?   General Comments  Aspen cervical collar in place throughout    Exercises Other Exercises Other Exercises: OT facilitates education re: safety and fall prevention, notifying pt of chair alarm and requesting use of call bell if pt needs to get OOB/out of chair. Pt with moderate reception. Other Exercises: OT facilitates education with pt re: safe hand placement with RW for sit to stand to sit. Pt demos moderate reception of teaching, but requires reinforcement. Other Exercises: OT facilitates education with pt re: d/c recommendations; rehab versus home and strongly encourages rehab for safety/fall prevention.   Shoulder Instructions      Home Living Family/patient expects to be discharged to:: Private residence Living Arrangements: Alone Available Help at Discharge: Friend(s);Available PRN/intermittently Type of Home: Apartment Home Access: Stairs to enter Entrance Stairs-Number of Steps: 1 Entrance Stairs-Rails: Can reach both Home Layout: One level     Bathroom Shower/Tub: Teacher, early years/pre: Standard         Additional Comments: Pt is somewhat questionable historian. States she has a female [employer] manager- who lives with her and is pretty much there all the time, but admission documentation specifies  otherwise. Unclear how much assistance/supervision is available at home.      Prior Functioning/Environment Level of Independence: Needs assistance  Gait / Transfers Assistance Needed: per pt, pt ambulating without AD but endorses poor balance and repeat falls ADL's / Homemaking Assistance Needed: Per previous therapy note, pt requiring some assist for bathing, dressing and toileting from friend-Theresa. Pt states she does try hard when Clarene Critchley is not available. States [name] does the cooking. Communication / Swallowing Assistance Needed: she states she wears hearing aids, but doesn't have them right now          OT Problem List: Decreased strength;Pain;Impaired sensation;Decreased cognition;Decreased knowledge of use of DME or AE;Impaired UE functional use;Impaired balance (sitting and/or standing)      OT Treatment/Interventions: Self-care/ADL training;Therapeutic exercise;Therapeutic activities;Cognitive remediation/compensation;Neuromuscular education;DME and/or AE instruction;Patient/family education;Balance training    OT Goals(Current goals can be found in the care plan section) Acute Rehab OT Goals Patient Stated Goal: go home OT Goal Formulation: With patient Time For Goal Achievement: 04/04/19 Potential to Achieve Goals: Good  OT Frequency: Min 1X/week   Barriers to D/C: Decreased caregiver support          Co-evaluation              AM-PAC OT "6 Clicks" Daily Activity     Outcome Measure Help from another person eating meals?: None Help from another person taking care of personal grooming?: A Little Help from another person toileting, which includes using toliet, bedpan,  or urinal?: A Lot Help from another person bathing (including washing, rinsing, drying)?: A Lot Help from another person to put on and taking off regular upper body clothing?: A Little Help from another person to put on and taking off regular lower body clothing?: A Little 6 Click Score: 17    End of Session Equipment Utilized During Treatment: Gait belt;Rolling walker;Cervical collar  Activity Tolerance: Patient tolerated treatment well Patient left: in chair;with call bell/phone within reach;with chair alarm set  OT Visit Diagnosis: Other abnormalities of gait and mobility (R26.89);Repeated falls (R29.6);Muscle weakness (generalized) (M62.81);Pain Pain - Right/Left: (neck)                Time: 3391-7921 OT Time Calculation (min): 38 min Charges:  OT General Charges $OT Visit: 1 Visit OT Evaluation $OT Eval Moderate Complexity: 1 Mod OT Treatments $Self Care/Home Management : 8-22 mins $Therapeutic Activity: 8-22 mins  Rejeana Brock, MS, OTR/L ascom [phone] 03/21/19, 1:32 PM

## 2019-03-22 DIAGNOSIS — S065X9A Traumatic subdural hemorrhage with loss of consciousness of unspecified duration, initial encounter: Principal | ICD-10-CM

## 2019-03-22 LAB — GLUCOSE, CAPILLARY
Glucose-Capillary: 227 mg/dL — ABNORMAL HIGH (ref 70–99)
Glucose-Capillary: 296 mg/dL — ABNORMAL HIGH (ref 70–99)
Glucose-Capillary: 240 mg/dL — ABNORMAL HIGH (ref 70–99)

## 2019-03-22 MED ORDER — TRAMADOL HCL 50 MG PO TABS: 50.0000 mg | ORAL_TABLET | Freq: Three times a day (TID) | ORAL | 0 refills | Status: AC | PRN

## 2019-03-22 NOTE — Discharge Summary (Addendum)
Physician Discharge Summary  Melanie F Radebaugh ZOX:[id number] DOB: 1945/06/13 DOA: 03/20/2019  PCP: Patient, No Pcp Per  Admit date: 03/20/2019 Discharge date: 03/22/2019  Admitted From: home Disposition:  SNF  Recommendations for Outpatient Follow-up:  1. Follow up with PCP in 1-2 weeks 2. F/u neurosurgery (Dr. Adriana Simas) in 1 week   Home Health:  Equipment/Devices:  Discharge Condition: stable CODE STATUS:full  Diet recommendation: Heart Healthy / Carb Modified   Brief/Interim Summary: HPI was taken from Dr. Cyndia Bent: Melanie King is a 74 y.o. female with medical history significant of chronic diastolic heart failure, sick sinus syndrome s/p pacemaker, CAD, COPD, Type 2 diabetes, hypothyroidism and chronic pain who presents with recurrent falls.  She was just discharged yesterday with non-operable C6 cervical fracture and wearing C-collar following a fall.  States she was standing by a table and fell when she tried to turn around. Denies hitting her head or any loss of consciousness. Fall was unwitnessed. States a home health aid found her. Other than that patient not able to provide much history and could not recall many details surrounding why she came in today.   ED Course: She was afrebile, normotensive on room air.  WBC of 12.3. Glucose of 144, creatinine of 1.80. CK of 1077. TSH of 65. Free T4 of 0.60. Negative UA.   CT head with small right parafalcine subdural hemorrhage measuring 2-7mm in thickness. No mass effect or midline shift.  CXR negative.   From Dr. Eliane Decree: Melanie F Cookeis a 74 y.o.femalewith medical history significant ofchronic diastolic heart failure, sick sinus syndrome s/p pacemaker, CAD, COPD, Type 2 diabetes, hypothyroidism and chronic pain who presents with recurrent falls.  She was just discharged yesterday with non-operable C6 cervical fracture and wearing C-collarfollowing a fall.   Hospital Course from Dr. Wilfred Lacy 03/22/19: Pt is tolerating the  C-collar. Pt is unsafe to d/c home as pt and pt's partner are unable to care for the pt & recurrent falls. PT saw the pt and rec SNF. Pt agreed to SNF. Pt will f/u w/ neurosurgery (Dr. Adriana Simas) as an outpatient.   Discharge Diagnoses:  Principal Problem:   Subdural hematoma (HCC) Active Problems:   CAD (coronary artery disease)   HLD (hyperlipidemia)   Hypertension   Tremor   Chronic diastolic CHF (congestive heart failure) (HCC)   C6 cervical fracture (HCC)   Hypothyroidism   AKI (acute kidney injury) (HCC)  Subdural hematoma:secondary to mechanical fall. Repeat CT showed stable hematoma. Continue frequent neurochecks. PT/OT--recommends SNF Pt is not safe at home with recurrent falls  Pain in pelvis region: pelvic X-ray-- no fracture   AKI: baseline likely around 1.05. Cr is trending down today. Avoid nephrotoxic agents  Acute Rhabdomyolysis: CK of 1077--600. Received IV fluids and encourage oral fluids  Hypothyroidism: continue on levothyroxine  Recent non-displaced C6 fracture: continue C-collar. Will need follow up with neurosurgery outpatient--pt has to f/u Dr Adriana Simas. Continue PRN oxycodone  CAD: hold aspirindue to subdural hematoma  Hypertension: continue amlodipine. Continue to hold losartan due to AKI  HLD: continue fenofibrate and statin   Chronic diastolic heart failure: appears euvolemia. Continue Lasix and metoprolol  DM2: Continue on home dose of metformin & januvia.   Tremors: unknown type or severity. Continue gabapentin, primidone,benztropine  Discharge Instructions  Discharge Instructions    Diet - low sodium heart healthy   Complete by: As directed    Discharge instructions   Complete by: As directed    F/u w/ neuro-surgery in  1 week; f/u w/ PCP in 1-2 weeks   Increase activity slowly   Complete by: As directed      Allergies as of 03/22/2019      Reactions   Alprazolam Swelling   Fentanyl Other (See Comments)   "burning and hot"    Meperidine Itching   Other reaction(s): Other (See Comments)   Ranitidine Hcl    Other reaction(s): Other (See Comments)      Medication List    STOP taking these medications   aspirin EC 81 MG tablet   meloxicam 15 MG tablet Commonly known as: MOBIC   oxyCODONE-acetaminophen 5-325 MG tablet Commonly known as: PERCOCET/ROXICET     TAKE these medications   amLODipine 10 MG tablet Commonly known as: NORVASC TAKE 1 TABLET BY MOUTH EVERY DAY   benztropine 0.5 MG tablet Commonly known as: COGENTIN Take 0.5 mg by mouth 2 (two) times daily.   calcitRIOL 0.25 MCG capsule Commonly known as: ROCALTROL Take 0.25 mcg by mouth daily.   citalopram 20 MG tablet Commonly known as: CELEXA Take 20 mg by mouth daily.   famotidine 40 MG tablet Commonly known as: PEPCID Take 40 mg by mouth daily.   fenofibrate 48 MG tablet Commonly known as: TRICOR Take 48 mg by mouth daily.   furosemide 20 MG tablet Commonly known as: LASIX Take 20 mg by mouth daily.   gabapentin 100 MG capsule Commonly known as: NEURONTIN Take 100-300 mg by mouth See admin instructions. Take 100 mg in the morning, and at noon, take 300 mg at bedtime   Lantus SoloStar 100 UNIT/ML Solostar Pen Generic drug: Insulin Glargine Inject 20 Units into the skin at bedtime.   levothyroxine 100 MCG tablet Commonly known as: SYNTHROID Take 1 tablet (100 mcg total) by mouth daily before breakfast.   losartan 50 MG tablet Commonly known as: COZAAR Take 50 mg by mouth daily.   metFORMIN 1000 MG tablet Commonly known as: GLUCOPHAGE Take 1,000 mg by mouth 2 (two) times daily with a meal.   metoprolol tartrate 25 MG tablet Commonly known as: LOPRESSOR Take 25 mg by mouth 2 (two) times daily.   montelukast 10 MG tablet Commonly known as: SINGULAIR Take 10 mg by mouth at bedtime.   nicotine 21 mg/24hr patch Commonly known as: NICODERM CQ - dosed in mg/24 hours Place 1 patch (21 mg total) onto the skin daily.    nitroGLYCERIN 0.2 mg/hr patch Commonly known as: NITRODUR - Dosed in mg/24 hr Place 0.2 mg onto the skin daily. leave patch on 12-14 hours, then remove for 10-12 hours prior to applying the next patch   primidone 50 MG tablet Commonly known as: MYSOLINE Take 50 mg by mouth 2 (two) times daily.   Rexulti 1 MG Tabs tablet Generic drug: brexpiprazole Take 1 mg by mouth at bedtime.   rosuvastatin 40 MG tablet Commonly known as: CRESTOR Take 40 mg by mouth at bedtime.   sitaGLIPtin 50 MG tablet Commonly known as: JANUVIA Take 50 mg by mouth daily.   traMADol 50 MG tablet Commonly known as: Ultram Take 1 tablet (50 mg total) by mouth every 8 (eight) hours as needed for up to 5 days for moderate pain or severe pain. What changed:   when to take this  reasons to take this   vitamin B-12 1000 MCG tablet Commonly known as: CYANOCOBALAMIN Take 1,000 mcg by mouth daily.      Contact information for after-discharge care    Destination  [facility] Preferred SNF .   Service: Skilled Nursing Contact information: [address] [place] [address] [phone]             Allergies  Allergen Reactions  . Alprazolam Swelling  . Fentanyl Other (See Comments)    "burning and hot"  . Meperidine Itching    Other reaction(s): Other (See Comments)  . Ranitidine Hcl     Other reaction(s): Other (See Comments)    Consultations:     Procedures/Studies: DG Pelvis 1-2 Views  Result Date: 03/21/2019 CLINICAL DATA:  Initial evaluation for acute pelvic pain. EXAM: PELVIS - 1-2 VIEW COMPARISON:  Prior radiograph from 04/02/2017. FINDINGS: No acute fracture or dislocation. Bony pelvis intact. Mild osteoarthritic changes noted about the SI joints bilaterally, slightly worse on the right. Mild degenerative change noted within the lower lumbar spine. Visualized soft tissues demonstrate no acute finding. Visualized bowel gas pattern is nonobstructive.  Prominent aorto bi-iliac atherosclerotic disease noted. IMPRESSION: 1. Mild osteoarthritic changes about the bilateral sacroiliac joints, with mild degenerative spondylosis within the lower lumbar spine. 2. Nonobstructive bowel gas pattern. No other acute abnormality about the pelvis. 3. Prominent aorto bi-iliac atherosclerotic disease. Electronically Signed   By: Rise Mu M.D.   On: 03/21/2019 02:48   CT HEAD WO CONTRAST  Result Date: 03/20/2019 CLINICAL DATA:  Subdural hematoma, unwitnessed fall, known C6 fracture EXAM: CT HEAD WITHOUT CONTRAST TECHNIQUE: Contiguous axial images were obtained from the base of the skull through the vertex without intravenous contrast. COMPARISON:  CT head 03/20/2019 FINDINGS: Brain: Stable appearance of a 2 mm right parafalcine subdural hematoma. No significant interval expansion. No new areas of hemorrhage. No CT evidence of acute large territory infarct. No mass effect or midline shift. Symmetric prominence of the ventricles, cisterns and sulci compatible with parenchymal volume loss. Patchy areas of white matter hypoattenuation are most compatible with chronic microvascular angiopathy. Vascular: Atherosclerotic calcification of the carotid siphons and intradural vertebral arteries. No hyperdense vessel. Skull: Diminished soft tissue swelling and scalp thickening with a right posterior parietal scalp hematoma. No subjacent calvarial fracture. No new areas of scalp swelling or suspicious osseous lesions. Sinuses/Orbits: Paranasal sinuses and mastoid air cells are predominantly clear. Other: None IMPRESSION: 1. Stable appearance of a 2 mm right parafalcine subdural hematoma. 2. No new sites of hemorrhage or other acute intracranial abnormality. 3. Diminished soft tissue swelling and scalp thickening with a right posterior parietal scalp hematoma. 4. Background of chronic microvascular angiopathy and parenchymal volume loss. Electronically Signed   By: Kreg Shropshire  M.D.   On: 03/20/2019 23:41   CT Head Wo Contrast  Result Date: 03/20/2019 CLINICAL DATA:  74 year old female with trauma. EXAM: CT HEAD WITHOUT CONTRAST CT CERVICAL SPINE WITHOUT CONTRAST TECHNIQUE: Multidetector CT imaging of the head and cervical spine was performed following the standard protocol without intravenous contrast. Multiplanar CT image reconstructions of the cervical spine were also generated. COMPARISON:  Head CT dated 03/10/2019. FINDINGS: CT HEAD FINDINGS Brain: Mild age-related atrophy and chronic microvascular ischemic changes. There is a small right parafalcine subdural hemorrhage measuring approximately 2 mm in thickness. No mass effect or midline shift. Small right lentiform nucleus old lacunar infarct noted. Vascular: No hyperdense vessel or unexpected calcification. Skull: Normal. Negative for fracture or focal lesion. Sinuses/Orbits: Mild mucoperiosteal thickening of paranasal sinuses. No air-fluid level. The mastoid air cells are clear. Other: Right posterior parietal scalp hematoma. CT CERVICAL SPINE FINDINGS Alignment: No acute subluxation. There is straightening of normal  cervical lordosis which may be positional or due to muscle spasm. Skull base and vertebrae: Similar appearance of the fracture involving the left anterior inferior endplate of C6 as seen on the prior CT of 03/10/2019. No new fractures. The bones are osteopenic. There is incomplete bony fusion of the posterior ring of C1. Soft tissues and spinal canal: No prevertebral fluid or swelling. No visible canal hematoma. Disc levels: Multilevel degenerative changes. Partial ankylosis at C4-C5. Multilevel facet arthropathy. Upper chest: Negative. Other: Bilateral carotid bulb calcified plaques. IMPRESSION: 1. Small right parafalcine subdural hemorrhage measuring 2-3 mm in thickness. No mass effect or midline shift. 2. Nondisplaced fracture of the left anterior inferior endplate of C6 as seen on the prior CT. No new cervical  spine fracture. These results were called by telephone at the time of interpretation on 03/20/2019 at 5:35 pm to provider Duffy Bruce , who verbally acknowledged these results. Electronically Signed   By: Anner Crete M.D.   On: 03/20/2019 17:40   CT Head Wo Contrast  Result Date: 03/10/2019 CLINICAL DATA:  HEAD TRAUMA. MODERATE TO SEVERE. ALTERED MENTAL STATUS. EXAM: CT HEAD WITHOUT CONTRAST CT CERVICAL SPINE WITHOUT CONTRAST TECHNIQUE: Multidetector CT imaging of the head and cervical spine was performed following the standard protocol without intravenous contrast. Multiplanar CT image reconstructions of the cervical spine were also generated. COMPARISON:  CT head without contrast 05/10/2017 FINDINGS: CT HEAD FINDINGS Brain: Mild atrophy and white matter changes are stable. A remote lacunar infarct is again seen in the right lentiform nucleus. No acute infarct, hemorrhage, or mass lesion is present. The ventricles are proportionate to the degree of atrophy. No significant extraaxial fluid collection is present. The brainstem and cerebellum are within normal limits. Vascular: Atherosclerotic calcifications are present within the cavernous internal carotid arteries bilaterally. Calcifications present at the dural margin of both vertebral arteries. No hyperdense vessel is present. Skull: Calvarium is intact. No focal lytic or blastic lesions are present. No significant extracranial soft tissue lesion is present. Sinuses/Orbits: The paranasal sinuses and mastoid air cells are clear. A left lens replacement is present. Globes and orbits are otherwise within normal limits. CT CERVICAL SPINE FINDINGS Alignment: No significant listhesis is present. There is straightening and slight reversal of the normal cervical lordosis. Mild rightward curvature is present in the upper cervical spine. Skull base and vertebrae: Craniocervical junction is normal. Vertebral body heights are within normal limits. A fracture is  present anteriorly on the left along the inferior endplate of C6. This predominantly involves the anterior endplate spur although there is portion of the vertebral body. No associated soft tissue edema or hematoma is evident. No other fractures are present. Soft tissues and spinal canal: No prevertebral fluid or swelling. No visible canal hematoma. Atherosclerotic calcifications are present at the carotid bifurcations bilaterally. Luminal narrowing is worse left than right. Disc levels: Multilevel degenerative changes are present. Central and foraminal narrowing is greatest at C3-4 due to a broad-based disc osteophyte complex and bilateral facet hypertrophy. Facet spurring is greatest at C4-5, left greater than right. There is ankylosis across the disc space at C4-5. Upper chest: Lung apices are clear. Thoracic inlet is within normal limits. IMPRESSION: 1. Fracture along the anterior inferior endplate of C6 with minimal displacement. No soft tissue changes are present. This fracture may not be acute. MRI could be used for further evaluation and assessment of acuity or any soft tissue injury. 2. No acute intracranial abnormality or significant interval change. 3. Stable atrophy and white matter  disease. 4. Stable remote lacunar infarct of the right lentiform nucleus. 5. Multilevel degenerative changes of the cervical spine as described. 6. Atherosclerosis. These results were called by telephone at the time of interpretation on 03/10/2019 at 8:08 am to provider Jene Every , who verbally acknowledged these results. Electronically Signed   By: Marin Roberts M.D.   On: 03/10/2019 08:08   CT Cervical Spine Wo Contrast  Result Date: 03/20/2019 CLINICAL DATA:  74 year old female with trauma. EXAM: CT HEAD WITHOUT CONTRAST CT CERVICAL SPINE WITHOUT CONTRAST TECHNIQUE: Multidetector CT imaging of the head and cervical spine was performed following the standard protocol without intravenous contrast. Multiplanar CT  image reconstructions of the cervical spine were also generated. COMPARISON:  Head CT dated 03/10/2019. FINDINGS: CT HEAD FINDINGS Brain: Mild age-related atrophy and chronic microvascular ischemic changes. There is a small right parafalcine subdural hemorrhage measuring approximately 2 mm in thickness. No mass effect or midline shift. Small right lentiform nucleus old lacunar infarct noted. Vascular: No hyperdense vessel or unexpected calcification. Skull: Normal. Negative for fracture or focal lesion. Sinuses/Orbits: Mild mucoperiosteal thickening of paranasal sinuses. No air-fluid level. The mastoid air cells are clear. Other: Right posterior parietal scalp hematoma. CT CERVICAL SPINE FINDINGS Alignment: No acute subluxation. There is straightening of normal cervical lordosis which may be positional or due to muscle spasm. Skull base and vertebrae: Similar appearance of the fracture involving the left anterior inferior endplate of C6 as seen on the prior CT of 03/10/2019. No new fractures. The bones are osteopenic. There is incomplete bony fusion of the posterior ring of C1. Soft tissues and spinal canal: No prevertebral fluid or swelling. No visible canal hematoma. Disc levels: Multilevel degenerative changes. Partial ankylosis at C4-C5. Multilevel facet arthropathy. Upper chest: Negative. Other: Bilateral carotid bulb calcified plaques. IMPRESSION: 1. Small right parafalcine subdural hemorrhage measuring 2-3 mm in thickness. No mass effect or midline shift. 2. Nondisplaced fracture of the left anterior inferior endplate of C6 as seen on the prior CT. No new cervical spine fracture. These results were called by telephone at the time of interpretation on 03/20/2019 at 5:35 pm to provider Shaune Pollack , who verbally acknowledged these results. Electronically Signed   By: Elgie Collard M.D.   On: 03/20/2019 17:40   CT Cervical Spine Wo Contrast  Result Date: 03/10/2019 CLINICAL DATA:  HEAD TRAUMA. MODERATE  TO SEVERE. ALTERED MENTAL STATUS. EXAM: CT HEAD WITHOUT CONTRAST CT CERVICAL SPINE WITHOUT CONTRAST TECHNIQUE: Multidetector CT imaging of the head and cervical spine was performed following the standard protocol without intravenous contrast. Multiplanar CT image reconstructions of the cervical spine were also generated. COMPARISON:  CT head without contrast 05/10/2017 FINDINGS: CT HEAD FINDINGS Brain: Mild atrophy and white matter changes are stable. A remote lacunar infarct is again seen in the right lentiform nucleus. No acute infarct, hemorrhage, or mass lesion is present. The ventricles are proportionate to the degree of atrophy. No significant extraaxial fluid collection is present. The brainstem and cerebellum are within normal limits. Vascular: Atherosclerotic calcifications are present within the cavernous internal carotid arteries bilaterally. Calcifications present at the dural margin of both vertebral arteries. No hyperdense vessel is present. Skull: Calvarium is intact. No focal lytic or blastic lesions are present. No significant extracranial soft tissue lesion is present. Sinuses/Orbits: The paranasal sinuses and mastoid air cells are clear. A left lens replacement is present. Globes and orbits are otherwise within normal limits. CT CERVICAL SPINE FINDINGS Alignment: No significant listhesis is present. There is straightening  and slight reversal of the normal cervical lordosis. Mild rightward curvature is present in the upper cervical spine. Skull base and vertebrae: Craniocervical junction is normal. Vertebral body heights are within normal limits. A fracture is present anteriorly on the left along the inferior endplate of C6. This predominantly involves the anterior endplate spur although there is portion of the vertebral body. No associated soft tissue edema or hematoma is evident. No other fractures are present. Soft tissues and spinal canal: No prevertebral fluid or swelling. No visible canal  hematoma. Atherosclerotic calcifications are present at the carotid bifurcations bilaterally. Luminal narrowing is worse left than right. Disc levels: Multilevel degenerative changes are present. Central and foraminal narrowing is greatest at C3-4 due to a broad-based disc osteophyte complex and bilateral facet hypertrophy. Facet spurring is greatest at C4-5, left greater than right. There is ankylosis across the disc space at C4-5. Upper chest: Lung apices are clear. Thoracic inlet is within normal limits. IMPRESSION: 1. Fracture along the anterior inferior endplate of C6 with minimal displacement. No soft tissue changes are present. This fracture may not be acute. MRI could be used for further evaluation and assessment of acuity or any soft tissue injury. 2. No acute intracranial abnormality or significant interval change. 3. Stable atrophy and white matter disease. 4. Stable remote lacunar infarct of the right lentiform nucleus. 5. Multilevel degenerative changes of the cervical spine as described. 6. Atherosclerosis. These results were called by telephone at the time of interpretation on 03/10/2019 at 8:08 am to provider Jene Every , who verbally acknowledged these results. Electronically Signed   By: Marin Roberts M.D.   On: 03/10/2019 08:08   DG Chest Portable 1 View  Result Date: 03/20/2019 CLINICAL DATA:  Fall EXAM: PORTABLE CHEST 1 VIEW COMPARISON:  03/10/2019 FINDINGS: Left pacer remains in place, unchanged. Heart is borderline in size. No confluent airspace opacities or effusions. No acute bony abnormality. IMPRESSION: No active disease. Electronically Signed   By: Charlett Nose M.D.   On: 03/20/2019 17:49   DG Chest Port 1 View  Result Date: 03/10/2019 CLINICAL DATA:  74 year old female with a history of altered mental status EXAM: PORTABLE CHEST 1 VIEW COMPARISON:  01/12/2019 FINDINGS: Cardiomediastinal silhouette unchanged in size and contour. Lower lung volumes compared to the prior with  developing opacities at the lung bases partially obscuring the right heart border and the retrocardiac region. No pneumothorax or pleural effusion. Unchanged cardiac pacing device. IMPRESSION: Low lung volumes with reticulonodular opacities at the lung bases, potentially atelectasis/scarring and/or multifocal infection. Unchanged cardiac pacing device. Electronically Signed   By: Gilmer Mor D.O.   On: 03/10/2019 08:37   DG HIP UNILAT WITH PELVIS 2-3 VIEWS RIGHT  Result Date: 03/20/2019 CLINICAL DATA:  Bilateral hip pain, unwitnessed fall EXAM: DG HIP (WITH OR WITHOUT PELVIS) 2-3V RIGHT COMPARISON:  06/29/2017 FINDINGS: Hip joints and SI joints are symmetric and unremarkable. No acute bony abnormality. Specifically, no fracture, subluxation, or dislocation. IMPRESSION: No acute bony abnormality. Electronically Signed   By: Charlett Nose M.D.   On: 03/20/2019 17:50      Subjective: Pt c/o faitgue  Discharge Exam: Vitals:   03/22/19 0418 03/22/19 0721  BP: (!) 178/76 (!) 163/61  Pulse: 70 70  Resp: 19 18  Temp: 98 F (36.7 C) (!) 97.5 F (36.4 C)  SpO2: 100% 100%   Vitals:   03/21/19 1458 03/21/19 1923 03/22/19 0418 03/22/19 0721  BP: (!) 148/70 (!) 153/68 (!) 178/76 (!) 163/61  Pulse: 69  70 70 70  Resp: 18 18 19 18   Temp: 98.1 F (36.7 C) (!) 97.5 F (36.4 C) 98 F (36.7 C) (!) 97.5 F (36.4 C)  TempSrc: Oral Oral Oral Oral  SpO2: 90% 97% 100% 100%  Weight:      Height:        General: Pt is alert, awake, not in acute distress Cardiovascular: S1/S2 +, no rubs, no gallops Respiratory: diminished breath sounds b/l, no wheezing, no rhonchi Abdominal: Soft, NT, ND, bowel sounds + Extremities: no edema, no cyanosis    The results of significant diagnostics from this hospitalization (including imaging, microbiology, ancillary and laboratory) are listed below for reference.     Microbiology: Recent Results (from the past 240 hour(s))  SARS CORONAVIRUS 2 (TAT 6-24 HRS)  Nasopharyngeal Nasopharyngeal Swab     Status: None   Collection Time: 03/20/19  7:58 PM   Specimen: Nasopharyngeal Swab  Result Value Ref Range Status   SARS Coronavirus 2 NEGATIVE NEGATIVE Final    Comment: (NOTE) SARS-CoV-2 target nucleic acids are NOT DETECTED. The SARS-CoV-2 RNA is generally detectable in upper and lower respiratory specimens during the acute phase of infection. Negative results do not preclude SARS-CoV-2 infection, do not rule out co-infections with other pathogens, and should not be used as the sole basis for treatment or other patient management decisions. Negative results must be combined with clinical observations, patient history, and epidemiological information. The expected result is Negative. Fact Sheet for Patients: 03/22/19 Fact Sheet for Healthcare Providers: HairSlick.no This test is not yet approved or cleared by the [employer] FDA and  has been authorized for detection and/or diagnosis of SARS-CoV-2 by FDA under an Emergency Use Authorization (EUA). This EUA will remain  in effect (meaning this test can be used) for the duration of the COVID-19 declaration under Section 56 4(b)(1) of the Act, 21 U.S.C. section 360bbb-3(b)(1), unless the authorization is terminated or revoked sooner. Performed at [facility], [address], [place], [place]      Labs: BNP (last 3 results) Recent Labs    03/10/19 1343  BNP 156.0*   Basic Metabolic Panel: Recent Labs  Lab 03/20/19 1629 03/21/19 0807  NA 136 138  K 4.1 3.6  CL 97* 101  CO2 27 28  GLUCOSE 144* 178*  BUN 23 17  CREATININE 1.80* 1.25*  CALCIUM 10.0 9.1   Liver Function Tests: No results for input(s): AST, ALT, ALKPHOS, BILITOT, PROT, ALBUMIN in the last 168 hours. No results for input(s): LIPASE, AMYLASE in the last 168 hours. No results for input(s): AMMONIA in the last 168 hours. CBC: Recent Labs   Lab 03/20/19 1629 03/21/19 0807  WBC 12.3* 9.3  NEUTROABS 9.2*  --   HGB 13.9 13.5  HCT 42.0 40.3  MCV 91.9 91.6  PLT 349 350   Cardiac Enzymes: Recent Labs  Lab 03/20/19 1648 03/21/19 0807  CKTOTAL 1,077* 689*   BNP: Invalid input(s): POCBNP CBG: Recent Labs  Lab 03/21/19 1150 03/21/19 1651 03/21/19 2132 03/22/19 0720 03/22/19 1124  GLUCAP 283* 299* 215* 227* 296*   D-Dimer No results for input(s): DDIMER in the last 72 hours. Hgb A1c No results for input(s): HGBA1C in the last 72 hours. Lipid Profile No results for input(s): CHOL, HDL, LDLCALC, TRIG, CHOLHDL, LDLDIRECT in the last 72 hours. Thyroid function studies Recent Labs    03/20/19 1629  TSH 65.928*   Anemia work up No results for input(s): VITAMINB12, FOLATE, FERRITIN, TIBC, IRON,  RETICCTPCT in the last 72 hours. Urinalysis    Component Value Date/Time   COLORURINE YELLOW (A) 03/20/2019 1653   APPEARANCEUR CLEAR (A) 03/20/2019 1653   APPEARANCEUR Clear 03/24/2013 1530   LABSPEC 1.013 03/20/2019 1653   LABSPEC 1.009 03/24/2013 1530   PHURINE 5.0 03/20/2019 1653   GLUCOSEU 150 (A) 03/20/2019 1653   GLUCOSEU 50 mg/dL 16/12/9602 5409   HGBUR NEGATIVE 03/20/2019 1653   BILIRUBINUR NEGATIVE 03/20/2019 1653   BILIRUBINUR Negative 03/24/2013 1530   KETONESUR NEGATIVE 03/20/2019 1653   PROTEINUR 30 (A) 03/20/2019 1653   NITRITE NEGATIVE 03/20/2019 1653   LEUKOCYTESUR NEGATIVE 03/20/2019 1653   LEUKOCYTESUR Negative 03/24/2013 1530   Sepsis Labs Invalid input(s): PROCALCITONIN,  WBC,  LACTICIDVEN Microbiology Recent Results (from the past 240 hour(s))  SARS CORONAVIRUS 2 (TAT 6-24 HRS) Nasopharyngeal Nasopharyngeal Swab     Status: None   Collection Time: 03/20/19  7:58 PM   Specimen: Nasopharyngeal Swab  Result Value Ref Range Status   SARS Coronavirus 2 NEGATIVE NEGATIVE Final    Comment: (NOTE) SARS-CoV-2 target nucleic acids are NOT DETECTED. The SARS-CoV-2 RNA is generally detectable in  upper and lower respiratory specimens during the acute phase of infection. Negative results do not preclude SARS-CoV-2 infection, do not rule out co-infections with other pathogens, and should not be used as the sole basis for treatment or other patient management decisions. Negative results must be combined with clinical observations, patient history, and epidemiological information. The expected result is Negative. Fact Sheet for Patients: HairSlick.no Fact Sheet for Healthcare Providers: quierodirigir.com This test is not yet approved or cleared by the [place] FDA and  has been authorized for detection and/or diagnosis of SARS-CoV-2 by FDA under an Emergency Use Authorization (EUA). This EUA will remain  in effect (meaning this test can be used) for the duration of the COVID-19 declaration under Section 56 4(b)(1) of the Act, 21 U.S.C. section 360bbb-3(b)(1), unless the authorization is terminated or revoked sooner. Performed at [facility], [address], [place], [place] [address]      Time coordinating discharge: Over 30 minutes  SIGNED:   Charise Killian, MD  [facility] 03/22/2019, 11:58 AM Pager   If 7PM-7AM, please contact night-coverage [email or web address] Password [email or web address]

## 2019-03-22 NOTE — TOC Transition Note (Signed)
Transition of Care [facility]) - CM/SW Discharge Note   Patient Details  Name: [place] F Mccaslin MRN: [id number] Date of Birth: 11-28-1945  Transition of Care [facility]) CM/SW Contact:  Darleene Cleaver, LCSW Phone Number: 03/22/2019, 6:16 PM   Clinical Narrative:     Patient to be d/c'ed today to [facility] room 702.  Patient and family agreeable to plans will transport via ems RN to call report [phone].   Final next level of care: Skilled Nursing Facility Barriers to Discharge: Barriers Resolved   Patient Goals and CMS Choice Patient states their goals for this hospitalization and ongoing recovery are:: To [facility] then return home with home health. CMS Medicare.gov Compare Post Acute Care list provided to:: Patient Choice offered to / list presented to : Patient  Discharge Placement PASRR number recieved: 03/21/19            Patient chooses bed at: [facility] [place] Patient to be transferred to facility by: [facility] Name of family member notified: Patient's significant other Roger Patient and family notified of of transfer: 03/22/19  Discharge Plan and Services   Discharge Planning Services: CM Consult Post Acute Care Choice: Skilled Nursing Facility          DME Arranged: N/A DME Agency: NA       HH Arranged: NA          Social Determinants of Health (SDOH) Interventions     Readmission Risk Interventions No flowsheet data found.

## 2019-03-22 NOTE — Progress Notes (Signed)
Pt. Left via [facility]. No distress noted. Her Vitals were stable.

## 2019-05-26 ENCOUNTER — Inpatient Hospital Stay
Admission: EM | Admit: 2019-05-26 | Discharge: 2019-05-28 | DRG: 071 | Disposition: A | Discharge status: Home / Self Care | Payer: Medicare Other | Attending: Internal Medicine | Admitting: Internal Medicine

## 2019-05-26 ENCOUNTER — Emergency Department: Payer: Medicare Other

## 2019-05-26 ENCOUNTER — Other Ambulatory Visit: Payer: Self-pay

## 2019-05-26 DIAGNOSIS — Z950 Presence of cardiac pacemaker: Secondary | ICD-10-CM | POA: Diagnosis not present

## 2019-05-26 DIAGNOSIS — Z79890 Hormone replacement therapy (postmenopausal): Secondary | ICD-10-CM | POA: Diagnosis not present

## 2019-05-26 DIAGNOSIS — E785 Hyperlipidemia, unspecified: Secondary | ICD-10-CM | POA: Diagnosis present

## 2019-05-26 DIAGNOSIS — R251 Tremor, unspecified: Secondary | ICD-10-CM | POA: Diagnosis present

## 2019-05-26 DIAGNOSIS — G9340 Encephalopathy, unspecified: Secondary | ICD-10-CM | POA: Diagnosis present

## 2019-05-26 DIAGNOSIS — N189 Chronic kidney disease, unspecified: Secondary | ICD-10-CM | POA: Diagnosis present

## 2019-05-26 DIAGNOSIS — E1129 Type 2 diabetes mellitus with other diabetic kidney complication: Secondary | ICD-10-CM | POA: Diagnosis present

## 2019-05-26 DIAGNOSIS — I495 Sick sinus syndrome: Secondary | ICD-10-CM | POA: Diagnosis present

## 2019-05-26 DIAGNOSIS — Z794 Long term (current) use of insulin: Secondary | ICD-10-CM | POA: Diagnosis not present

## 2019-05-26 DIAGNOSIS — I110 Hypertensive heart disease with heart failure: Secondary | ICD-10-CM | POA: Diagnosis present

## 2019-05-26 DIAGNOSIS — E039 Hypothyroidism, unspecified: Secondary | ICD-10-CM | POA: Diagnosis present

## 2019-05-26 DIAGNOSIS — F329 Major depressive disorder, single episode, unspecified: Secondary | ICD-10-CM | POA: Diagnosis present

## 2019-05-26 DIAGNOSIS — Z87891 Personal history of nicotine dependence: Secondary | ICD-10-CM

## 2019-05-26 DIAGNOSIS — R531 Weakness: Secondary | ICD-10-CM

## 2019-05-26 DIAGNOSIS — R296 Repeated falls: Secondary | ICD-10-CM | POA: Diagnosis present

## 2019-05-26 DIAGNOSIS — J449 Chronic obstructive pulmonary disease, unspecified: Secondary | ICD-10-CM | POA: Diagnosis present

## 2019-05-26 DIAGNOSIS — E119 Type 2 diabetes mellitus without complications: Secondary | ICD-10-CM | POA: Diagnosis present

## 2019-05-26 DIAGNOSIS — Z79899 Other long term (current) drug therapy: Secondary | ICD-10-CM

## 2019-05-26 DIAGNOSIS — K219 Gastro-esophageal reflux disease without esophagitis: Secondary | ICD-10-CM | POA: Diagnosis present

## 2019-05-26 DIAGNOSIS — G9341 Metabolic encephalopathy: Principal | ICD-10-CM | POA: Diagnosis present

## 2019-05-26 DIAGNOSIS — I2510 Atherosclerotic heart disease of native coronary artery without angina pectoris: Secondary | ICD-10-CM | POA: Diagnosis present

## 2019-05-26 DIAGNOSIS — N179 Acute kidney failure, unspecified: Secondary | ICD-10-CM | POA: Diagnosis present

## 2019-05-26 DIAGNOSIS — Z888 Allergy status to other drugs, medicaments and biological substances status: Secondary | ICD-10-CM | POA: Diagnosis not present

## 2019-05-26 DIAGNOSIS — I5032 Chronic diastolic (congestive) heart failure: Secondary | ICD-10-CM | POA: Diagnosis present

## 2019-05-26 DIAGNOSIS — Z20822 Contact with and (suspected) exposure to covid-19: Secondary | ICD-10-CM | POA: Diagnosis present

## 2019-05-26 DIAGNOSIS — I10 Essential (primary) hypertension: Secondary | ICD-10-CM | POA: Diagnosis present

## 2019-05-26 DIAGNOSIS — Z9181 History of falling: Secondary | ICD-10-CM

## 2019-05-26 LAB — CBC
HCT: 33.3 % — ABNORMAL LOW (ref 36.0–46.0)
Hemoglobin: 10.9 g/dL — ABNORMAL LOW (ref 12.0–15.0)
MCH: 31.2 pg (ref 26.0–34.0)
MCHC: 32.7 g/dL (ref 30.0–36.0)
MCV: 95.4 fL (ref 80.0–100.0)
Platelets: 334 10*3/uL (ref 150–400)
RBC: 3.49 MIL/uL — ABNORMAL LOW (ref 3.87–5.11)
RDW: 15 % (ref 11.5–15.5)
WBC: 8.9 10*3/uL (ref 4.0–10.5)
nRBC: 0 % (ref 0.0–0.2)

## 2019-05-26 LAB — GLUCOSE, CAPILLARY
Glucose-Capillary: 69 mg/dL — ABNORMAL LOW (ref 70–99)
Glucose-Capillary: 81 mg/dL (ref 70–99)
Glucose-Capillary: 90 mg/dL (ref 70–99)

## 2019-05-26 LAB — URINALYSIS, COMPLETE (UACMP) WITH MICROSCOPIC
Bacteria, UA: NONE SEEN
Bilirubin Urine: NEGATIVE
Glucose, UA: NEGATIVE mg/dL
Hgb urine dipstick: NEGATIVE
Ketones, ur: NEGATIVE mg/dL
Leukocytes,Ua: NEGATIVE
Nitrite: NEGATIVE
Protein, ur: NEGATIVE mg/dL
Specific Gravity, Urine: 1.005 (ref 1.005–1.030)
Squamous Epithelial / HPF: NONE SEEN (ref 0–5)
pH: 6 (ref 5.0–8.0)

## 2019-05-26 LAB — BLOOD GAS, VENOUS
Acid-base deficit: 2.8 mmol/L — ABNORMAL HIGH (ref 0.0–2.0)
Bicarbonate: 24.1 mmol/L (ref 20.0–28.0)
O2 Saturation: 47.2 %
Patient temperature: 37
pCO2, Ven: 49 mmHg (ref 44.0–60.0)
pH, Ven: 7.3 (ref 7.250–7.430)
pO2, Ven: <31 mmHg — CL (ref 32.0–45.0)

## 2019-05-26 LAB — URINE DRUG SCREEN, QUALITATIVE (ARMC ONLY)
Amphetamines, Ur Screen: NOT DETECTED
Barbiturates, Ur Screen: POSITIVE — AB
Benzodiazepine, Ur Scrn: NOT DETECTED
Cannabinoid 50 Ng, Ur ~~LOC~~: NOT DETECTED
Cocaine Metabolite,Ur ~~LOC~~: NOT DETECTED
MDMA (Ecstasy)Ur Screen: NOT DETECTED
Methadone Scn, Ur: NOT DETECTED
Opiate, Ur Screen: NOT DETECTED
Phencyclidine (PCP) Ur S: NOT DETECTED
Tricyclic, Ur Screen: NOT DETECTED

## 2019-05-26 LAB — BASIC METABOLIC PANEL
Anion gap: 9 (ref 5–15)
BUN: 26 mg/dL — ABNORMAL HIGH (ref 8–23)
CO2: 23 mmol/L (ref 22–32)
Calcium: 8.9 mg/dL (ref 8.9–10.3)
Chloride: 104 mmol/L (ref 98–111)
Creatinine, Ser: 1.65 mg/dL — ABNORMAL HIGH (ref 0.44–1.00)
GFR calc Af Amer: 35 mL/min — ABNORMAL LOW (ref >60)
GFR calc non Af Amer: 30 mL/min — ABNORMAL LOW (ref >60)
Glucose, Bld: 74 mg/dL (ref 70–99)
Potassium: 4.1 mmol/L (ref 3.5–5.1)
Sodium: 136 mmol/L (ref 135–145)

## 2019-05-26 LAB — HEPATIC FUNCTION PANEL
ALT: 15 U/L (ref 0–44)
AST: 18 U/L (ref 15–41)
Albumin: 4.4 g/dL (ref 3.5–5.0)
Alkaline Phosphatase: 56 U/L (ref 38–126)
Bilirubin, Direct: <0.1 mg/dL (ref 0.0–0.2)
Total Bilirubin: 0.6 mg/dL (ref 0.3–1.2)
Total Protein: 8.1 g/dL (ref 6.5–8.1)

## 2019-05-26 LAB — BRAIN NATRIURETIC PEPTIDE: B Natriuretic Peptide: 158 pg/mL — ABNORMAL HIGH (ref 0.0–100.0)

## 2019-05-26 MED ORDER — ACETAMINOPHEN 650 MG RE SUPP: 650.0000 mg | Freq: Four times a day (QID) | RECTAL | Status: DC | PRN

## 2019-05-26 MED ORDER — INSULIN ASPART 100 UNIT/ML ~~LOC~~ SOLN
0.0000 [IU] | Freq: Three times a day (TID) | SUBCUTANEOUS | Status: DC
  Administered 2019-05-27 (×2): 3 [IU] via SUBCUTANEOUS
  Administered 2019-05-28: 2 [IU] via SUBCUTANEOUS
  Administered 2019-05-28: 12:00:00 5 [IU] via SUBCUTANEOUS
  Filled 2019-05-26 (×3): qty 1

## 2019-05-26 MED ORDER — NALOXONE HCL 2 MG/2ML IJ SOSY
0.4000 mg | PREFILLED_SYRINGE | Freq: Once | INTRAMUSCULAR | Status: AC
  Administered 2019-05-26: 0.4 mg via INTRAVENOUS
  Filled 2019-05-26: qty 2

## 2019-05-26 MED ORDER — FUROSEMIDE 10 MG/ML IJ SOLN
20.0000 mg | Freq: Once | INTRAMUSCULAR | Status: AC
  Administered 2019-05-27: 20 mg via INTRAVENOUS
  Filled 2019-05-26: qty 4

## 2019-05-26 MED ORDER — SENNOSIDES-DOCUSATE SODIUM 8.6-50 MG PO TABS: 1.0000 | ORAL_TABLET | Freq: Every evening | ORAL | Status: DC | PRN

## 2019-05-26 MED ORDER — IPRATROPIUM-ALBUTEROL 0.5-2.5 (3) MG/3ML IN SOLN: 3.0000 mL | Freq: Once | RESPIRATORY_TRACT | Status: DC

## 2019-05-26 MED ORDER — SODIUM CHLORIDE 0.9 % IV SOLN: INTRAVENOUS | Status: DC

## 2019-05-26 MED ORDER — ENOXAPARIN SODIUM 30 MG/0.3ML ~~LOC~~ SOLN
30.0000 mg | SUBCUTANEOUS | Status: DC
  Administered 2019-05-27: 23:00:00 30 mg via SUBCUTANEOUS
  Filled 2019-05-26 (×3): qty 0.3

## 2019-05-26 MED ORDER — ONDANSETRON HCL 4 MG PO TABS: 4.0000 mg | ORAL_TABLET | Freq: Four times a day (QID) | ORAL | Status: DC | PRN

## 2019-05-26 MED ORDER — ACETAMINOPHEN 325 MG PO TABS: 650.0000 mg | ORAL_TABLET | Freq: Four times a day (QID) | ORAL | Status: DC | PRN

## 2019-05-26 MED ORDER — SODIUM CHLORIDE 0.9% FLUSH
3.0000 mL | Freq: Once | INTRAVENOUS | Status: AC
  Administered 2019-05-26: 3 mL via INTRAVENOUS

## 2019-05-26 MED ORDER — ONDANSETRON HCL 4 MG/2ML IJ SOLN: 4.0000 mg | Freq: Four times a day (QID) | INTRAMUSCULAR | Status: DC | PRN

## 2019-05-26 NOTE — ED Provider Notes (Signed)
[facility] Emergency Department Provider Note  ____________________________________________   First MD Initiated Contact with Patient 05/26/19 1935     (approximate)  I have reviewed the triage vital signs and the nursing notes.   HISTORY  Chief Complaint Weakness    HPI Melanie King is a 74 y.o. female  With h/o DM, HTN, HLD, CHF, here with confusion. History is somewhat limited. Per report, pt has had increasing falls, confusion x "a few weeks" or months per friend that called to have her evaluated. Pt reports to me that she just feels "drained." She admits she has been having some back pain and took tramadol x 3 just prior to arrival. She has had a few falls as well, during which she did hit her head though denies headache, neck pain at this time. She does endorse some mild cough, occasional SOB as well. No CP. She denies any known sick contacts. Remainder of history limited 2/2 AMS, encephalopathy.      Level 5 caveat invoked as remainder of history, ROS, and physical exam limited due to patient's confusion.   Past Medical History:  Diagnosis Date  . Arthritis   . CHF (congestive heart failure) (HCC)   . Coronary arteriosclerosis   . DDD (degenerative disc disease), cervical   . DDD (degenerative disc disease), lumbar   . Depression   . Diabetes mellitus without complication (HCC)   . GERD (gastroesophageal reflux disease)   . Hyperlipidemia   . Hypertension   . Osteoarthritis   . Pacemaker   . Tremor     Patient Active Problem List   Diagnosis Date Noted  . Frequent falls 05/26/2019  . Generalized weakness 05/26/2019  . AKI (acute kidney injury) (HCC) 03/21/2019  . Subdural hematoma (HCC) 03/20/2019  . Hypothyroidism 03/10/2019  . Depression   . Chronic diastolic CHF (congestive heart failure) (HCC)   . Acute metabolic encephalopathy   . Fall   . Tobacco abuse   . C6 cervical fracture (HCC)   . Acute delirium 05/12/2017  .  Seizure (HCC) 05/10/2017  . Elevated sedimentation rate 04/04/2017  . Elevated C-reactive protein (CRP) 04/04/2017  . Chest pain, unspecified 04/02/2017  . Bilateral lower extremity pain (Secondary Area of Pain) (R>L) 04/02/2017  . Chronic hip pain, bilateral  (Secondary Area of Pain) (R>L) 04/02/2017  . Chronic bilateral low back pain with bilateral sciatica [facility] Area of Pain) (R>L) 04/02/2017  . Chronic pain syndrome 04/02/2017  . Disorder of bone, unspecified 04/02/2017  . Other long term (current) drug therapy 04/02/2017  . Other specified health status 04/02/2017  . Long term current use of opiate analgesic 04/02/2017  . Hip pain, bilateral 12/06/2016  . Tremor 05/27/2016  . Trigger finger of left hand 02/16/2016  . Osteoarthritis of both hands 02/16/2016  . Angina pectoris (HCC) 01/17/2016  . COPD (chronic obstructive pulmonary disease) (HCC) 01/17/2016  . CAD (coronary artery disease) 01/17/2016  . HLD (hyperlipidemia) 01/17/2016  . Hypertension 01/17/2016  . Sick sinus syndrome (HCC) 01/17/2016  . Chronic pain 01/17/2016  . Personal history of tobacco use, presenting hazards to health 11/16/2015  . Type II diabetes mellitus with renal manifestations (HCC) 05/12/2013    Past Surgical History:  Procedure Laterality Date  . ABDOMINAL HYSTERECTOMY    . BREAST BIOPSY Right 1994   neg cyst removed  . CHOLECYSTECTOMY    . EYE SURGERY  1964, 1966, and 1967   bilateral  . FOOT SURGERY Right    cellulitis  .  PACEMAKER INSERTION    . SPINE SURGERY     cyst removed; [facility]    Prior to Admission medications   Medication Sig Start Date End Date Taking? Authorizing Provider  amLODipine (NORVASC) 10 MG tablet TAKE 1 TABLET BY MOUTH EVERY DAY 08/28/16   Gabriel Cirri, NP  benztropine (COGENTIN) 0.5 MG tablet Take 0.5 mg by mouth 2 (two) times daily.    [provider]  brexpiprazole (REXULTI) 1 MG TABS tablet Take 1 mg by mouth at bedtime.    [provider]  calcitRIOL (ROCALTROL) 0.25 MCG capsule Take 0.25 mcg by mouth daily.    [provider]  citalopram (CELEXA) 20 MG tablet Take 20 mg by mouth daily.    [provider]  famotidine (PEPCID) 40 MG tablet Take 40 mg by mouth daily.    [provider]  fenofibrate (TRICOR) 48 MG tablet Take 48 mg by mouth daily.    [provider]  furosemide (LASIX) 20 MG tablet Take 20 mg by mouth daily.    [provider]  gabapentin (NEURONTIN) 100 MG capsule Take 100-300 mg by mouth See admin instructions. Take 100 mg in the morning, and at noon, take 300 mg at bedtime    [provider]  LANTUS SOLOSTAR 100 UNIT/ML Solostar Pen Inject 20 Units into the skin at bedtime. 03/19/19   [provider]  levothyroxine (SYNTHROID) 100 MCG tablet Take 1 tablet (100 mcg total) by mouth daily before breakfast. 03/12/19   Dhungel, Nishant, MD  losartan (COZAAR) 50 MG tablet Take 50 mg by mouth daily.    [provider]  metFORMIN (GLUCOPHAGE) 1000 MG tablet Take 1,000 mg by mouth 2 (two) times daily with a meal.    [provider]  metoprolol tartrate (LOPRESSOR) 25 MG tablet Take 25 mg by mouth 2 (two) times daily.    [provider]  montelukast (SINGULAIR) 10 MG tablet Take 10 mg by mouth at bedtime.    [provider]  nicotine (NICODERM CQ - DOSED IN MG/24 HOURS) 21 mg/24hr patch Place 1 patch (21 mg total) onto the skin daily. 03/13/19   Dhungel, Theda Belfast, MD  nitroGLYCERIN (NITRODUR - DOSED IN MG/24 HR) 0.2 mg/hr patch Place 0.2 mg onto the skin daily. leave patch on 12-14 hours, then remove for 10-12 hours prior to applying the next patch    [provider]  primidone (MYSOLINE) 50 MG tablet Take 50 mg by mouth 2 (two) times daily.    [provider]  rosuvastatin (CRESTOR) 40 MG tablet Take 40 mg by mouth at bedtime.    [provider]  sitaGLIPtin (JANUVIA) 50 MG tablet Take 50 mg  by mouth daily.    [provider]  vitamin B-12 (CYANOCOBALAMIN) 1000 MCG tablet Take 1,000 mcg by mouth daily.    [provider]    Allergies Alprazolam, Fentanyl, Meperidine, and Ranitidine hcl  Family History  Problem Relation Age of Onset  . Breast cancer Maternal Aunt 40  . Cancer Mother        colon  . Aneurysm Father     Social History Social History   Tobacco Use  . Smoking status: Current Every Day Smoker    Packs/day: 1.00    Years: 50.00    Pack years: 50.00    Types: Cigarettes  . Smokeless tobacco: Never Used  Substance Use Topics  . Alcohol use: No  . Drug use: No    Review of  Systems  Review of Systems  Unable to perform ROS: Mental status change  Constitutional: Positive for fatigue.  Psychiatric/Behavioral: Positive for confusion.     ____________________________________________  PHYSICAL EXAM:      VITAL SIGNS: ED Triage Vitals  Enc Vitals Group     BP 05/26/19 1855 (!) 126/57     Pulse Rate 05/26/19 1855 70     Resp 05/26/19 1855 18     Temp 05/26/19 1855 98.1 F (36.7 C)     Temp src --      SpO2 05/26/19 1855 95 %     Weight 05/26/19 1856 153 lb (69.4 kg)     Height 05/26/19 1856  (1.549 m)     Head Circumference --      [facility] Flow --      Pain Score 05/26/19 1856 0     Pain Loc --      Pain Edu? --      Excl. in GC? --      Physical Exam Vitals and nursing note reviewed.  Constitutional:      General: She is not in acute distress.    Appearance: She is well-developed.  HENT:     Head: Normocephalic and atraumatic.  Eyes:     Conjunctiva/sclera: Conjunctivae normal.  Cardiovascular:     Rate and Rhythm: Normal rate and regular rhythm.     Heart sounds: Normal heart sounds.  Pulmonary:     Effort: No respiratory distress.     Breath sounds: Rales (bibasilar) present. No wheezing.     Comments: Slight tachypnea noted with basilar rales. Abdominal:     General: There is no distension.   Musculoskeletal:     Cervical back: Neck supple.     Right lower leg: Edema present.     Left lower leg: Edema present.  Skin:    General: Skin is warm.     Capillary Refill: Capillary refill takes less than 2 seconds.     Findings: No rash.  Neurological:     Mental Status: She is alert.     Motor: No abnormal muscle tone.     Comments: Oriented to person, place, but not time. Easily falls asleep and speech is slightly slurred. MAE with at least antigravity strength. CN intact.       ____________________________________________   LABS (all labs ordered are listed, but only abnormal results are displayed)  Labs Reviewed  BASIC METABOLIC PANEL - Abnormal; Notable for the following components:      Result Value   BUN 26 (*)    Creatinine, Ser 1.65 (*)    GFR calc non Af Amer 30 (*)    GFR calc Af Amer 35 (*)    All other components within normal limits  CBC - Abnormal; Notable for the following components:   RBC 3.49 (*)    Hemoglobin 10.9 (*)    HCT 33.3 (*)    All other components within normal limits  URINALYSIS, COMPLETE (UACMP) WITH MICROSCOPIC - Abnormal; Notable for the following components:   Color, Urine YELLOW (*)    APPearance CLEAR (*)    All other components within normal limits  GLUCOSE, CAPILLARY - Abnormal; Notable for the following components:   Glucose-Capillary 69 (*)    All other components within normal limits  BLOOD GAS, VENOUS - Abnormal; Notable for the following components:   pO2, Ven <31.0 (*)    Acid-base deficit 2.8 (*)    All other components within normal  limits  URINE DRUG SCREEN, QUALITATIVE ([facility] ONLY) - Abnormal; Notable for the following components:   Barbiturates, Ur Screen POSITIVE (*)    All other components within normal limits  BRAIN NATRIURETIC PEPTIDE - Abnormal; Notable for the following components:   B Natriuretic Peptide 158.0 (*)    All other components within normal limits  TSH - Abnormal; Notable for the following  components:   TSH 61.000 (*)    All other components within normal limits  SARS CORONAVIRUS 2 (TAT 6-24 HRS)  GLUCOSE, CAPILLARY  GLUCOSE, CAPILLARY  HEPATIC FUNCTION PANEL  HEMOGLOBIN A1C  CBG MONITORING, ED    ____________________________________________  EKG: Atrial paced rhythm, VR 70. LAFB with RBBB. No acute ST elevations or depressions. No signs of ischemia. ________________________________________  RADIOLOGY All imaging, including plain films, CT scans, and ultrasounds, independently reviewed by me, and interpretations confirmed via formal radiology reads.  ED MD interpretation:   CT Head: NAICA CXR: Clear, no focal abnormality  Official radiology report(s): CT Head Wo Contrast  Result Date: 05/26/2019 CLINICAL DATA:  74 year old female with head trauma. EXAM: CT HEAD WITHOUT CONTRAST CT CERVICAL SPINE WITHOUT CONTRAST TECHNIQUE: Multidetector CT imaging of the head and cervical spine was performed following the standard protocol without intravenous contrast. Multiplanar CT image reconstructions of the cervical spine were also generated. COMPARISON:  Head CT dated 03/20/2019. FINDINGS: CT HEAD FINDINGS Brain: There is mild age-related atrophy and chronic microvascular ischemic changes. Small old right basal ganglia lacunar infarct. There is no acute intracranial hemorrhage. No mass effect or midline shift. No extra-axial fluid collection. Vascular: No hyperdense vessel or unexpected calcification. Skull: Normal. Negative for fracture or focal lesion. Sinuses/Orbits: No acute finding. Other: None CT CERVICAL SPINE FINDINGS Alignment: No acute subluxation. There is reversal of normal cervical lordosis which may be positional or due to muscle spasm. Skull base and vertebrae: No acute fracture. Osteopenia. There is incomplete bony fusion of the posterior ring of C1. Soft tissues and spinal canal: No prevertebral fluid or swelling. No visible canal hematoma. Disc levels:  Multilevel  degenerative changes. Upper chest: Negative. Other: Partially visualized pacemaker wire. Atherosclerotic calcification of the aortic arch. Bilateral carotid bulb calcified plaques. IMPRESSION: 1. No acute intracranial pathology. 2. Mild age-related atrophy and chronic microvascular ischemic changes. 3. No acute/traumatic cervical spine pathology. 4. Aortic Atherosclerosis (ICD10-I70.0). Electronically Signed   By: Elgie Collard M.D.   On: 05/26/2019 21:42   CT Cervical Spine Wo Contrast  Result Date: 05/26/2019 CLINICAL DATA:  74 year old female with head trauma. EXAM: CT HEAD WITHOUT CONTRAST CT CERVICAL SPINE WITHOUT CONTRAST TECHNIQUE: Multidetector CT imaging of the head and cervical spine was performed following the standard protocol without intravenous contrast. Multiplanar CT image reconstructions of the cervical spine were also generated. COMPARISON:  Head CT dated 03/20/2019. FINDINGS: CT HEAD FINDINGS Brain: There is mild age-related atrophy and chronic microvascular ischemic changes. Small old right basal ganglia lacunar infarct. There is no acute intracranial hemorrhage. No mass effect or midline shift. No extra-axial fluid collection. Vascular: No hyperdense vessel or unexpected calcification. Skull: Normal. Negative for fracture or focal lesion. Sinuses/Orbits: No acute finding. Other: None CT CERVICAL SPINE FINDINGS Alignment: No acute subluxation. There is reversal of normal cervical lordosis which may be positional or due to muscle spasm. Skull base and vertebrae: No acute fracture. Osteopenia. There is incomplete bony fusion of the posterior ring of C1. Soft tissues and spinal canal: No prevertebral fluid or swelling. No visible canal hematoma. Disc levels:  Multilevel degenerative  changes. Upper chest: Negative. Other: Partially visualized pacemaker wire. Atherosclerotic calcification of the aortic arch. Bilateral carotid bulb calcified plaques. IMPRESSION: 1. No acute intracranial  pathology. 2. Mild age-related atrophy and chronic microvascular ischemic changes. 3. No acute/traumatic cervical spine pathology. 4. Aortic Atherosclerosis (ICD10-I70.0). Electronically Signed   By: Elgie Collard M.D.   On: 05/26/2019 21:42   DG Chest Portable 1 View  Result Date: 05/26/2019 CLINICAL DATA:  Weakness EXAM: PORTABLE CHEST 1 VIEW COMPARISON:  03/20/2019 FINDINGS: Left pacer remains in place, unchanged. Bilateral perihilar and lower lobe airspace opacities concerning for edema. Possible layering effusions. No acute bony abnormality. IMPRESSION: Bilateral airspace opacities concerning for edema. Suspect small layering effusions. Electronically Signed   By: Charlett Nose M.D.   On: 05/26/2019 21:43    ____________________________________________  PROCEDURES   Procedure(s) performed (including Critical Care):  Procedures  ____________________________________________  INITIAL IMPRESSION / MDM / ASSESSMENT AND PLAN / ED COURSE  As part of my medical decision making, I reviewed the following data within the electronic MEDICAL RECORD NUMBER Nursing notes reviewed and incorporated, Old chart reviewed, Notes from prior ED visits, and [place] Controlled Substance Database       *Melanie F Olejnik was evaluated in Emergency Department on 05/27/2019 for the symptoms described in the history of present illness. She was evaluated in the context of the global COVID-19 pandemic, which necessitated consideration that the patient might be at risk for infection with the SARS-CoV-2 virus that causes COVID-19. Institutional protocols and algorithms that pertain to the evaluation of patients at risk for COVID-19 are in a state of rapid change based on information released by regulatory bodies including the CDC and federal and state organizations. These policies and algorithms were followed during the patient's care in the ED.  Some ED evaluations and interventions may be delayed as a result of limited staffing  during the pandemic.*     Medical Decision Making:  74 yo F here with generalized weakness and reported increasing confusion. Unclear primary etiology, but suspect this could be 2/2 polypharmacy. Pt also noted to be tachypneic with bilateral rales, elevated BNP and CXR c/f edema which could be contributing. Otherwise, she has no focal neurological deficits to suggest stroke, seizure. She does not appear septic. UA negative for UTI. CXR without PNA. Her labs are otherwise unremarkable. VBG without CO2 retention. No h/o liver disease or hyperammonemia. Will admit for diuresis, monitoring of mental status//med adjustments.  ____________________________________________  FINAL CLINICAL IMPRESSION(S) / ED DIAGNOSES  Final diagnoses:  Generalized weakness  Encephalopathy     MEDICATIONS GIVEN DURING THIS VISIT:  Medications  furosemide (LASIX) injection 20 mg (has no administration in time range)  ipratropium-albuterol (DUONEB) 0.5-2.5 (3) MG/3ML nebulizer solution 3 mL (has no administration in time range)  enoxaparin (LOVENOX) injection 30 mg (has no administration in time range)  0.9 %  sodium chloride infusion (has no administration in time range)  acetaminophen (TYLENOL) tablet 650 mg (has no administration in time range)    Or  acetaminophen (TYLENOL) suppository 650 mg (has no administration in time range)  senna-docusate (Senokot-S) tablet 1 tablet (has no administration in time range)  ondansetron (ZOFRAN) tablet 4 mg (has no administration in time range)    Or  ondansetron (ZOFRAN) injection 4 mg (has no administration in time range)  insulin aspart (novoLOG) injection 0-15 Units (has no administration in time range)  sodium chloride flush (NS) 0.9 % injection 3 mL (3 mLs Intravenous Given 05/26/19 2034)  naloxone (NARCAN) injection  0.4 mg (0.4 mg Intravenous Given 05/26/19 2031)     ED Discharge Orders    None       Note:  This document was prepared using Dragon voice  recognition software and may include unintentional dictation errors.   Duffy Bruce, MD 05/27/19 [phone]

## 2019-05-26 NOTE — H&P (Signed)
History and Physical    Melanie F Wilemon QQP:[id number] DOB: 04-29-1945 DOA: 05/26/2019  PCP: Patient, No Pcp Per   Patient coming from: home  I have personally briefly reviewed patient's old medical records in [facility]  Chief Complaint: weakness, falls  HPI: Melanie King is a 74 y.o. female with medical history significant for DM, HTN, diastolic CHF, CAD, hypothyroidism, sick sinus syndrome with pacemaker, with history of frequent falls resulting in C6 fracture back in December, subdural hematoma in January 2021, discharged to SNF, who presented to the emergency room for evaluation of generalized weakness, altered mental status and frequent falls.  History is limited due to altered mental status.  ED Course: Work-up in the ER significant for creatinine of 1.65 up from 0.92, otherwise mostly unremarkable.  EKG showed no acute ST-T wave changes.  BNP slightly elevated at 158, chest x-ray showed bilateral airspace disease concerning for edema.  Venous pH unremarkable.  CT head and C-spine were negative for acute injury.  Her vitals were within normal limits.  Urinalysis clean.  Patient was administered Narcan in the emergency room.  Review of Systems: Unable to perform due to altered mental status  Past Medical History:  Diagnosis Date  . Arthritis   . CHF (congestive heart failure) (HCC)   . Coronary arteriosclerosis   . DDD (degenerative disc disease), cervical   . DDD (degenerative disc disease), lumbar   . Depression   . Diabetes mellitus without complication (HCC)   . GERD (gastroesophageal reflux disease)   . Hyperlipidemia   . Hypertension   . Osteoarthritis   . Pacemaker   . Tremor     Past Surgical History:  Procedure Laterality Date  . ABDOMINAL HYSTERECTOMY    . BREAST BIOPSY Right 1994   neg cyst removed  . CHOLECYSTECTOMY    . EYE SURGERY  1964, 1966, and 1967   bilateral  . FOOT SURGERY Right    cellulitis  . PACEMAKER INSERTION    . SPINE SURGERY      cyst removed; [facility]     reports that she has been smoking cigarettes. She has a 50.00 pack-year smoking history. She has never used smokeless tobacco. She reports that she does not drink alcohol or use drugs.  Allergies  Allergen Reactions  . Alprazolam Swelling  . Fentanyl Other (See Comments)    "burning and hot"  . Meperidine Itching    Other reaction(s): Other (See Comments)  . Ranitidine Hcl     Other reaction(s): Other (See Comments)    Family History  Problem Relation Age of Onset  . Breast cancer Maternal Aunt 40  . Cancer Mother        colon  . Aneurysm Father      Prior to Admission medications   Medication Sig Start Date End Date Taking? Authorizing Provider  amLODipine (NORVASC) 10 MG tablet TAKE 1 TABLET BY MOUTH EVERY DAY 08/28/16   Gabriel Cirri, NP  benztropine (COGENTIN) 0.5 MG tablet Take 0.5 mg by mouth 2 (two) times daily.    [provider]  brexpiprazole (REXULTI) 1 MG TABS tablet Take 1 mg by mouth at bedtime.    [provider]  calcitRIOL (ROCALTROL) 0.25 MCG capsule Take 0.25 mcg by mouth daily.    [provider]  citalopram (CELEXA) 20 MG tablet Take 20 mg by mouth daily.    [provider]  famotidine (PEPCID) 40 MG tablet Take 40 mg by mouth daily.  [provider]  fenofibrate (TRICOR) 48 MG tablet Take 48 mg by mouth daily.    [provider]  furosemide (LASIX) 20 MG tablet Take 20 mg by mouth daily.    [provider]  gabapentin (NEURONTIN) 100 MG capsule Take 100-300 mg by mouth See admin instructions. Take 100 mg in the morning, and at noon, take 300 mg at bedtime    [provider]  LANTUS SOLOSTAR 100 UNIT/ML Solostar Pen Inject 20 Units into the skin at bedtime. 03/19/19   [provider]  levothyroxine (SYNTHROID) 100 MCG tablet Take 1 tablet (100 mcg total) by mouth daily before breakfast. 03/12/19   Dhungel, Nishant, MD  losartan (COZAAR) 50 MG  tablet Take 50 mg by mouth daily.    [provider]  metFORMIN (GLUCOPHAGE) 1000 MG tablet Take 1,000 mg by mouth 2 (two) times daily with a meal.    [provider]  metoprolol tartrate (LOPRESSOR) 25 MG tablet Take 25 mg by mouth 2 (two) times daily.    [provider]  montelukast (SINGULAIR) 10 MG tablet Take 10 mg by mouth at bedtime.    [provider]  nicotine (NICODERM CQ - DOSED IN MG/24 HOURS) 21 mg/24hr patch Place 1 patch (21 mg total) onto the skin daily. 03/13/19   Dhungel, Theda Belfast, MD  nitroGLYCERIN (NITRODUR - DOSED IN MG/24 HR) 0.2 mg/hr patch Place 0.2 mg onto the skin daily. leave patch on 12-14 hours, then remove for 10-12 hours prior to applying the next patch    [provider]  primidone (MYSOLINE) 50 MG tablet Take 50 mg by mouth 2 (two) times daily.    [provider]  rosuvastatin (CRESTOR) 40 MG tablet Take 40 mg by mouth at bedtime.    [provider]  sitaGLIPtin (JANUVIA) 50 MG tablet Take 50 mg by mouth daily.    [provider]  vitamin B-12 (CYANOCOBALAMIN) 1000 MCG tablet Take 1,000 mcg by mouth daily.    [provider]    Physical Exam: Vitals:   05/26/19 1855 05/26/19 1856 05/26/19 2033 05/26/19 2129  BP: (!) 126/57  (!) 153/89 (!) 178/84  Pulse: 70  73 70  Resp: 18  16 16   Temp: 98.1 F (36.7 C)     SpO2: 95%  92% 93%  Weight:  69.4 kg    Height:  5\' 1"  (1.549 m)       Vitals:   05/26/19 1855 05/26/19 1856 05/26/19 2033 05/26/19 2129  BP: (!) 126/57  (!) 153/89 (!) 178/84  Pulse: 70  73 70  Resp: 18  16 16   Temp: 98.1 F (36.7 C)     SpO2: 95%  92% 93%  Weight:  69.4 kg    Height:  5\' 1"  (1.549 m)      Constitutional: Somnolent, will awake when name is called but readily falls back asleep Eyes: PERLA, EOMI, irises appear normal, anicteric sclera,  ENMT: external ears and nose appear normal, normal hearing             Lips appears normal, oropharynx mucosa,  tongue, posterior pharynx appear normal  Neck: neck appears normal, no masses, normal ROM, no thyromegaly, no JVD  CVS: S1-S2 clear, no murmur rubs or gallops,  , no carotid bruits, pedal pulses palpable, No LE edema Respiratory:  clear to auscultation bilaterally, no wheezing, rales or rhonchi. Respiratory effort normal. No accessory muscle use.  Abdomen: soft nontender, nondistended, normal bowel sounds, no hepatosplenomegaly, no  hernias Musculoskeletal: : no cyanosis, clubbing , no contractures or atrophy Neuro: Cranial nerves II-XII intact, sensation, reflexes normal, strength Psych: Unable to evaluate due to altered mental status Skin: no rashes or lesions or ulcers, no induration or nodules   Labs on Admission: I have personally reviewed following labs and imaging studies  CBC: Recent Labs  Lab 05/26/19 1857  WBC 8.9  HGB 10.9*  HCT 33.3*  MCV 95.4  PLT 956   Basic Metabolic Panel: Recent Labs  Lab 05/26/19 1857  NA 136  K 4.1  CL 104  CO2 23  GLUCOSE 74  BUN 26*  CREATININE 1.65*  CALCIUM 8.9   GFR: Estimated Creatinine Clearance: 27 mL/min (A) (by C-G formula based on SCr of 1.65 mg/dL (H)). Liver Function Tests: Recent Labs  Lab 05/26/19 1857  AST 18  ALT 15  ALKPHOS 56  BILITOT 0.6  PROT 8.1  ALBUMIN 4.4   No results for input(s): LIPASE, AMYLASE in the last 168 hours. No results for input(s): AMMONIA in the last 168 hours. Coagulation Profile: No results for input(s): INR, PROTIME in the last 168 hours. Cardiac Enzymes: No results for input(s): CKTOTAL, CKMB, CKMBINDEX, TROPONINI in the last 168 hours. BNP (last 3 results) No results for input(s): PROBNP in the last 8760 hours. HbA1C: No results for input(s): HGBA1C in the last 72 hours. CBG: Recent Labs  Lab 05/26/19 1905 05/26/19 1936 05/26/19 2015  GLUCAP 69* 81 90   Lipid Profile: No results for input(s): CHOL, HDL, LDLCALC, TRIG, CHOLHDL, LDLDIRECT in the last 72 hours. Thyroid  Function Tests: No results for input(s): TSH, T4TOTAL, FREET4, T3FREE, THYROIDAB in the last 72 hours. Anemia Panel: No results for input(s): VITAMINB12, FOLATE, FERRITIN, TIBC, IRON, RETICCTPCT in the last 72 hours. Urine analysis:    Component Value Date/Time   COLORURINE YELLOW (A) 03/20/2019 1653   APPEARANCEUR CLEAR (A) 03/20/2019 1653   APPEARANCEUR Clear 03/24/2013 1530   LABSPEC 1.013 03/20/2019 1653   LABSPEC 1.009 03/24/2013 1530   PHURINE 5.0 03/20/2019 1653   GLUCOSEU 150 (A) 03/20/2019 1653   GLUCOSEU 50 mg/dL 03/24/2013 1530   HGBUR NEGATIVE 03/20/2019 1653   BILIRUBINUR NEGATIVE 03/20/2019 1653   BILIRUBINUR Negative 03/24/2013 1530   KETONESUR NEGATIVE 03/20/2019 1653   PROTEINUR 30 (A) 03/20/2019 1653   NITRITE NEGATIVE 03/20/2019 1653   LEUKOCYTESUR NEGATIVE 03/20/2019 1653   LEUKOCYTESUR Negative 03/24/2013 1530    Radiological Exams on Admission: CT Head Wo Contrast  Result Date: 05/26/2019 CLINICAL DATA:  74 year old female with head trauma. EXAM: CT HEAD WITHOUT CONTRAST CT CERVICAL SPINE WITHOUT CONTRAST TECHNIQUE: Multidetector CT imaging of the head and cervical spine was performed following the standard protocol without intravenous contrast. Multiplanar CT image reconstructions of the cervical spine were also generated. COMPARISON:  Head CT dated 03/20/2019. FINDINGS: CT HEAD FINDINGS Brain: There is mild age-related atrophy and chronic microvascular ischemic changes. Small old right basal ganglia lacunar infarct. There is no acute intracranial hemorrhage. No mass effect or midline shift. No extra-axial fluid collection. Vascular: No hyperdense vessel or unexpected calcification. Skull: Normal. Negative for fracture or focal lesion. Sinuses/Orbits: No acute finding. Other: None CT CERVICAL SPINE FINDINGS Alignment: No acute subluxation. There is reversal of normal cervical lordosis which may be positional or due to muscle spasm. Skull base and vertebrae: No acute  fracture. Osteopenia. There is incomplete bony fusion of the posterior ring of C1. Soft tissues and spinal canal: No prevertebral fluid or swelling. No visible canal hematoma. Disc  levels:  Multilevel degenerative changes. Upper chest: Negative. Other: Partially visualized pacemaker wire. Atherosclerotic calcification of the aortic arch. Bilateral carotid bulb calcified plaques. IMPRESSION: 1. No acute intracranial pathology. 2. Mild age-related atrophy and chronic microvascular ischemic changes. 3. No acute/traumatic cervical spine pathology. 4. Aortic Atherosclerosis (ICD10-I70.0). Electronically Signed   By: Elgie Collard M.D.   On: 05/26/2019 21:42   CT Cervical Spine Wo Contrast  Result Date: 05/26/2019 CLINICAL DATA:  74 year old female with head trauma. EXAM: CT HEAD WITHOUT CONTRAST CT CERVICAL SPINE WITHOUT CONTRAST TECHNIQUE: Multidetector CT imaging of the head and cervical spine was performed following the standard protocol without intravenous contrast. Multiplanar CT image reconstructions of the cervical spine were also generated. COMPARISON:  Head CT dated 03/20/2019. FINDINGS: CT HEAD FINDINGS Brain: There is mild age-related atrophy and chronic microvascular ischemic changes. Small old right basal ganglia lacunar infarct. There is no acute intracranial hemorrhage. No mass effect or midline shift. No extra-axial fluid collection. Vascular: No hyperdense vessel or unexpected calcification. Skull: Normal. Negative for fracture or focal lesion. Sinuses/Orbits: No acute finding. Other: None CT CERVICAL SPINE FINDINGS Alignment: No acute subluxation. There is reversal of normal cervical lordosis which may be positional or due to muscle spasm. Skull base and vertebrae: No acute fracture. Osteopenia. There is incomplete bony fusion of the posterior ring of C1. Soft tissues and spinal canal: No prevertebral fluid or swelling. No visible canal hematoma. Disc levels:  Multilevel degenerative changes.  Upper chest: Negative. Other: Partially visualized pacemaker wire. Atherosclerotic calcification of the aortic arch. Bilateral carotid bulb calcified plaques. IMPRESSION: 1. No acute intracranial pathology. 2. Mild age-related atrophy and chronic microvascular ischemic changes. 3. No acute/traumatic cervical spine pathology. 4. Aortic Atherosclerosis (ICD10-I70.0). Electronically Signed   By: Elgie Collard M.D.   On: 05/26/2019 21:42   DG Chest Portable 1 View  Result Date: 05/26/2019 CLINICAL DATA:  Weakness EXAM: PORTABLE CHEST 1 VIEW COMPARISON:  03/20/2019 FINDINGS: Left pacer remains in place, unchanged. Bilateral perihilar and lower lobe airspace opacities concerning for edema. Possible layering effusions. No acute bony abnormality. IMPRESSION: Bilateral airspace opacities concerning for edema. Suspect small layering effusions. Electronically Signed   By: Charlett Nose M.D.   On: 05/26/2019 21:43    EKG: Independently reviewed.   Assessment/Plan Principal Problem:   Acute metabolic encephalopathy   Frequent falls   Generalized weakness -Patient presenting with altered mental status, generalized weakness and falls per reports -History of falls resulting in injury, C6 fracture and subdural hematoma -Differential includes neurocardiogenic syncope, dehydration, general physical deconditioning.  No evidence of acute infection. -CT head with no acute findings and patient has no focal deficits -Echocardiogram, carotid Doppler, continuous cardiac monitoring -None -Fall and aspiration precautions -Physical therapy evaluation, nutritionist and speech eval's    AKI (acute kidney injury) (HCC) -No additional hydration at this point given concern on chest x-ray for bilateral airspace disease and mildly elevated BNP of 158    CAD (coronary artery disease) -No complaints of chest pain and EKG nonacute -Check troponins continue rosuvastatin, metoprolol and nitroglycerin patch.  Not currently on  aspirin for unclear reason, probably following subdural hematoma    Hypertension -Blood pressure control -Continue amlodipine and losartan as well as metoprolol    Type II diabetes mellitus with renal manifestations (HCC) -Hold home Metformin and sitagliptin -Insulin sliding scale coverage    Tremor No acute concerns    Hypothyroidism -Follow TSH Continue levothyroxine    DVT prophylaxis: Lovenox  Code Status: full code  Family  Communication:  none  Disposition Plan: Uncertain, may need SNF Consults called: none  Status:inp    Andris Baumann MD [facility]     05/26/2019, 10:17 PM

## 2019-05-26 NOTE — ED Notes (Signed)
First Nurse Note:  Pt in via [facility] from home; pt living with a friend whom per EMS, reports increasing weakness, falls, and confusion over the last couple of months.    Per EMS report, pt oriented to self and situation only.  Pt alert, NAD noted at this time.    CBG 70.

## 2019-05-26 NOTE — ED Triage Notes (Addendum)
Pt comes via POV from home with c/o weakness. Pt states she passed out at her friends house.  Pt denies any SOB or CP. Pt states she just feels really tired and weak. Pt states she did hit her head. No blood thinners per pt.  Pt falling asleep during triage. Pt barely able to open eyes. Pt is alert to place. Pt disoriented to time.

## 2019-05-27 ENCOUNTER — Encounter: Payer: Self-pay | Admitting: Internal Medicine

## 2019-05-27 ENCOUNTER — Other Ambulatory Visit: Payer: Self-pay

## 2019-05-27 DIAGNOSIS — Z794 Long term (current) use of insulin: Secondary | ICD-10-CM

## 2019-05-27 DIAGNOSIS — R531 Weakness: Secondary | ICD-10-CM

## 2019-05-27 DIAGNOSIS — E1129 Type 2 diabetes mellitus with other diabetic kidney complication: Secondary | ICD-10-CM

## 2019-05-27 DIAGNOSIS — E039 Hypothyroidism, unspecified: Secondary | ICD-10-CM

## 2019-05-27 DIAGNOSIS — R296 Repeated falls: Secondary | ICD-10-CM

## 2019-05-27 DIAGNOSIS — N179 Acute kidney failure, unspecified: Secondary | ICD-10-CM

## 2019-05-27 LAB — GLUCOSE, CAPILLARY
Glucose-Capillary: 109 mg/dL — ABNORMAL HIGH (ref 70–99)
Glucose-Capillary: 195 mg/dL — ABNORMAL HIGH (ref 70–99)
Glucose-Capillary: 187 mg/dL — ABNORMAL HIGH (ref 70–99)
Glucose-Capillary: 166 mg/dL — ABNORMAL HIGH (ref 70–99)

## 2019-05-27 LAB — SARS CORONAVIRUS 2 (TAT 6-24 HRS): SARS Coronavirus 2: NEGATIVE

## 2019-05-27 LAB — TSH: TSH: 61 u[IU]/mL — ABNORMAL HIGH (ref 0.350–4.500)

## 2019-05-27 MED ORDER — CITALOPRAM HYDROBROMIDE 20 MG PO TABS
20.0000 mg | ORAL_TABLET | Freq: Every day | ORAL | Status: DC
  Administered 2019-05-28: 10:00:00 20 mg via ORAL
  Filled 2019-05-27: qty 1

## 2019-05-27 MED ORDER — INSULIN GLARGINE 100 UNIT/ML ~~LOC~~ SOLN
20.0000 [IU] | Freq: Every day | SUBCUTANEOUS | Status: DC
  Administered 2019-05-27: 23:00:00 20 [IU] via SUBCUTANEOUS
  Filled 2019-05-27 (×2): qty 0.2

## 2019-05-27 MED ORDER — PRIMIDONE 50 MG PO TABS
50.0000 mg | ORAL_TABLET | Freq: Two times a day (BID) | ORAL | Status: DC
  Administered 2019-05-27 – 2019-05-28 (×2): 50 mg via ORAL
  Filled 2019-05-27 (×3): qty 1

## 2019-05-27 MED ORDER — LEVOTHYROXINE SODIUM 25 MCG PO TABS
125.0000 ug | ORAL_TABLET | Freq: Every day | ORAL | Status: DC
  Administered 2019-05-28: 125 ug via ORAL
  Filled 2019-05-27: qty 1

## 2019-05-27 MED ORDER — ROSUVASTATIN CALCIUM 20 MG PO TABS
40.0000 mg | ORAL_TABLET | Freq: Every day | ORAL | Status: DC
  Administered 2019-05-27: 40 mg via ORAL
  Filled 2019-05-27: qty 2

## 2019-05-27 MED ORDER — AMLODIPINE BESYLATE 10 MG PO TABS
10.0000 mg | ORAL_TABLET | Freq: Every day | ORAL | Status: DC
  Administered 2019-05-28: 10:00:00 10 mg via ORAL
  Filled 2019-05-27: qty 1

## 2019-05-27 MED ORDER — BENZTROPINE MESYLATE 0.5 MG PO TABS
0.5000 mg | ORAL_TABLET | Freq: Two times a day (BID) | ORAL | Status: DC
  Administered 2019-05-27 – 2019-05-28 (×2): 0.5 mg via ORAL
  Filled 2019-05-27 (×3): qty 1

## 2019-05-27 MED ORDER — NICOTINE 21 MG/24HR TD PT24
21.0000 mg | MEDICATED_PATCH | Freq: Every day | TRANSDERMAL | Status: DC
  Administered 2019-05-28: 10:00:00 21 mg via TRANSDERMAL
  Filled 2019-05-27: qty 1

## 2019-05-27 MED ORDER — MONTELUKAST SODIUM 10 MG PO TABS
10.0000 mg | ORAL_TABLET | Freq: Every day | ORAL | Status: DC
  Administered 2019-05-27: 10 mg via ORAL
  Filled 2019-05-27: qty 1

## 2019-05-27 MED ORDER — BREXPIPRAZOLE 1 MG PO TABS
1.0000 mg | ORAL_TABLET | Freq: Every day | ORAL | Status: DC
  Administered 2019-05-27: 1 mg via ORAL
  Filled 2019-05-27 (×2): qty 1

## 2019-05-27 MED ORDER — CALCITRIOL 0.25 MCG PO CAPS
0.2500 ug | ORAL_CAPSULE | Freq: Every day | ORAL | Status: DC
  Administered 2019-05-27 – 2019-05-28 (×2): 0.25 ug via ORAL
  Filled 2019-05-27 (×2): qty 1

## 2019-05-27 MED ORDER — NITROGLYCERIN 0.2 MG/HR TD PT24
0.2000 mg | MEDICATED_PATCH | Freq: Every day | TRANSDERMAL | Status: DC
  Administered 2019-05-27 – 2019-05-28 (×2): 0.2 mg via TRANSDERMAL
  Filled 2019-05-27 (×2): qty 1

## 2019-05-27 MED ORDER — VITAMIN B-12 1000 MCG PO TABS
1000.0000 ug | ORAL_TABLET | Freq: Every day | ORAL | Status: DC
  Administered 2019-05-28: 10:00:00 1000 ug via ORAL
  Filled 2019-05-27: qty 1

## 2019-05-27 MED ORDER — METOPROLOL TARTRATE 25 MG PO TABS
25.0000 mg | ORAL_TABLET | Freq: Two times a day (BID) | ORAL | Status: DC
  Administered 2019-05-27 – 2019-05-28 (×2): 25 mg via ORAL
  Filled 2019-05-27 (×2): qty 1

## 2019-05-27 NOTE — ED Notes (Signed)
Patient pleasantly confused asking about being here and having to stay. Patient oriented to situation. Patient incontinent to urine.

## 2019-05-27 NOTE — ED Notes (Signed)
This tech and Nicki [place], EDT changed pts linen; placed clean chucks and brief on pt. Pts wet pants placed in pts belongings bag. Pt lying in bed at this time. No other needs voiced.

## 2019-05-27 NOTE — Progress Notes (Signed)
PROGRESS NOTE    Melanie King  BJY:[id number] DOB: [birth year] DOA: 05/26/2019 PCP: Patient, No Pcp Per    Brief Narrative:  74 year old female with a history of diabetes, hypertension, hypothyroidism, who was admitted in 03/2019 after a fall and subdural hematoma.  She was discharged to a skilled nursing facility.  It is unclear how long she was there.  She returned to the hospital today with recurrent falls and altered mental status.  Her significant other reports that patient has been more confused lately and has been falling more frequently.  Her CT head was negative.  She did not have any neurologic deficits.  It was felt that her symptoms may be related to polypharmacy.  Overall mental status is improving.  She may have some cognitive impairment/early dementia.  Continued monitoring her mental status as needed.  Physical therapy evaluation to assess discharge disposition is also been requested.   Assessment & Plan:   Principal Problem:   Acute metabolic encephalopathy Active Problems:   CAD (coronary artery disease)   Hypertension   Type II diabetes mellitus with renal manifestations (HCC)   Tremor   Hypothyroidism   AKI (acute kidney injury) (HCC)   Frequent falls   Generalized weakness   1. Acute metabolic encephalopathy.  Etiology is not entirely clear, although I suspect that this may be related to polypharmacy.  CT head did not show any acute findings.  She does not have any focal deficits on exam.  Overall mental status appears to have improved since arrival since she is no longer as lethargic.  She does have some confusion which she describes is worse at night.  She may have some underlying cognitive impairment/early dementia.  Echocardiogram, carotid Dopplers have been ordered. 2. Acute kidney injury.  Patient's baseline creatinine is unclear since it has ranged from 0.9-1.8 over the past several months.  Currently, creatinine is 1.6.  She is not particularly volume  overloaded.  It was reported that she had some bilateral infiltrates consistent with pulmonary edema on chest x-ray.  ER note indicates that she was mildly short of breath on arrival.  Will avoid IV fluids at this time.  Monitor urine output. 3. Coronary artery disease.  No complaints of chest pain.  Continue on metoprolol, statin. 4. Hypertension.  Blood pressure stable.  Continue on amlodipine.  Will hold on losartan due to fluctuating creatinine. 5. Diabetes.  Discontinue Metformin.  Hold sitagliptin until discharge.  Continue on sliding scale insulin.  Continue on home dose of Lantus. 6. Hypothyroidism.  Patient is chronically on levothyroxine, but is unclear if she is taking this appropriately.  Her significant other is unsure whether she is taking this on empty stomach.  TSH is elevated at 61.  This is lower than it was 2 months ago at 65.  We will increase her levothyroxine from 100 to daily.  She will need repeat thyroid studies in 4 weeks. 7. Frequent falls.  Significant other reports that she had 3 falls yesterday and falls on a regular basis.  Physical therapy consulted.   DVT prophylaxis: Lovenox Code Status: Full code Family Communication: Discussed with significant other, Roger over the phone Disposition Plan: Patient is currently from home.  Patient and significant other want patient to be discharged to assisted living facility.  She has been falling frequently and likely needs more supervision than she has at home.  Physical therapy evaluation pending.   Consultants:     Procedures:     Antimicrobials:  Subjective: Patient knows she is in the hospital but does not know how she arrived here.  She wants to go home.  Denies any shortness of breath at this time.  She does report feeling confused, mostly at night.  Objective: Vitals:   05/26/19 1855 05/26/19 1856 05/26/19 2033 05/26/19 2129  BP: (!) 126/57  (!) 153/89 (!) 178/84  Pulse: 70  73 70  Resp: 18   16 16   Temp: 98.1 F (36.7 C)     SpO2: 95%  92% 93%  Weight:  69.4 kg    Height:  5\' 1"  (1.549 m)     No intake or output data in the 24 hours ending 05/27/19 1201 Filed Weights   05/26/19 1856  Weight: 69.4 kg    Examination:  General exam: Patient is sitting up in bed.  Appears calm. Respiratory system: Clear to auscultation. Respiratory effort normal. Cardiovascular system: S1 & S2 heard, RRR. No JVD, murmurs, rubs, gallops or clicks. No pedal edema. Gastrointestinal system: Abdomen is nondistended, soft and nontender. No organomegaly or masses felt. Normal bowel sounds heard. Central nervous system: No focal neurological deficits.  Very hard of hearing Extremities: Symmetric 5 x 5 power. Skin: No rashes, lesions or ulcers Psychiatry: Confused at times.  Does not know the date, but knows she is in the hospital    Data Reviewed: I have personally reviewed following labs and imaging studies  CBC: Recent Labs  Lab 05/26/19 1857  WBC 8.9  HGB 10.9*  HCT 33.3*  MCV 95.4  PLT 334   Basic Metabolic Panel: Recent Labs  Lab 05/26/19 1857  NA 136  K 4.1  CL 104  CO2 23  GLUCOSE 74  BUN 26*  CREATININE 1.65*  CALCIUM 8.9   GFR: Estimated Creatinine Clearance: 27 mL/min (A) (by C-G formula based on SCr of 1.65 mg/dL (H)). Liver Function Tests: Recent Labs  Lab 05/26/19 1857  AST 18  ALT 15  ALKPHOS 56  BILITOT 0.6  PROT 8.1  ALBUMIN 4.4   No results for input(s): LIPASE, AMYLASE in the last 168 hours. No results for input(s): AMMONIA in the last 168 hours. Coagulation Profile: No results for input(s): INR, PROTIME in the last 168 hours. Cardiac Enzymes: No results for input(s): CKTOTAL, CKMB, CKMBINDEX, TROPONINI in the last 168 hours. BNP (last 3 results) No results for input(s): PROBNP in the last 8760 hours. HbA1C: No results for input(s): HGBA1C in the last 72 hours. CBG: Recent Labs  Lab 05/26/19 1905 05/26/19 1936 05/26/19 2015  05/27/19 0756  GLUCAP 69* 81 90 109*   Lipid Profile: No results for input(s): CHOL, HDL, LDLCALC, TRIG, CHOLHDL, LDLDIRECT in the last 72 hours. Thyroid Function Tests: Recent Labs    05/26/19 1857  TSH 61.000*   Anemia Panel: No results for input(s): VITAMINB12, FOLATE, FERRITIN, TIBC, IRON, RETICCTPCT in the last 72 hours. Sepsis Labs: No results for input(s): PROCALCITON, LATICACIDVEN in the last 168 hours.  Recent Results (from the past 240 hour(s))  SARS CORONAVIRUS 2 (TAT 6-24 HRS) Nasopharyngeal     Status: None   Collection Time: 05/26/19 10:02 PM   Specimen: Nasopharyngeal  Result Value Ref Range Status   SARS Coronavirus 2 NEGATIVE NEGATIVE Final    Comment: (NOTE) SARS-CoV-2 target nucleic acids are NOT DETECTED. The SARS-CoV-2 RNA is generally detectable in upper and lower respiratory specimens during the acute phase of infection. Negative results do not preclude SARS-CoV-2 infection, do not rule out co-infections with other pathogens, and  should not be used as the sole basis for treatment or other patient management decisions. Negative results must be combined with clinical observations, patient history, and epidemiological information. The expected result is Negative. Fact Sheet for Patients: SugarRoll.be Fact Sheet for Healthcare Providers: https://www.woods-mathews.com/ This test is not yet approved or cleared by the [place] FDA and  has been authorized for detection and/or diagnosis of SARS-CoV-2 by FDA under an Emergency Use Authorization (EUA). This EUA will remain  in effect (meaning this test can be used) for the duration of the COVID-19 declaration under Section 56 4(b)(1) of the Act, 21 U.S.C. section 360bbb-3(b)(1), unless the authorization is terminated or revoked sooner. Performed at [facility], [place] [address], [place], [place] [address]          Radiology Studies: CT Head Wo  Contrast  Result Date: 05/26/2019 CLINICAL DATA:  74 year old female with head trauma. EXAM: CT HEAD WITHOUT CONTRAST CT CERVICAL SPINE WITHOUT CONTRAST TECHNIQUE: Multidetector CT imaging of the head and cervical spine was performed following the standard protocol without intravenous contrast. Multiplanar CT image reconstructions of the cervical spine were also generated. COMPARISON:  Head CT dated 03/20/2019. FINDINGS: CT HEAD FINDINGS Brain: There is mild age-related atrophy and chronic microvascular ischemic changes. Small old right basal ganglia lacunar infarct. There is no acute intracranial hemorrhage. No mass effect or midline shift. No extra-axial fluid collection. Vascular: No hyperdense vessel or unexpected calcification. Skull: Normal. Negative for fracture or focal lesion. Sinuses/Orbits: No acute finding. Other: None CT CERVICAL SPINE FINDINGS Alignment: No acute subluxation. There is reversal of normal cervical lordosis which may be positional or due to muscle spasm. Skull base and vertebrae: No acute fracture. Osteopenia. There is incomplete bony fusion of the posterior ring of C1. Soft tissues and spinal canal: No prevertebral fluid or swelling. No visible canal hematoma. Disc levels:  Multilevel degenerative changes. Upper chest: Negative. Other: Partially visualized pacemaker wire. Atherosclerotic calcification of the aortic arch. Bilateral carotid bulb calcified plaques. IMPRESSION: 1. No acute intracranial pathology. 2. Mild age-related atrophy and chronic microvascular ischemic changes. 3. No acute/traumatic cervical spine pathology. 4. Aortic Atherosclerosis (ICD10-I70.0). Electronically Signed   By: Anner Crete M.D.   On: 05/26/2019 21:42   CT Cervical Spine Wo Contrast  Result Date: 05/26/2019 CLINICAL DATA:  74 year old female with head trauma. EXAM: CT HEAD WITHOUT CONTRAST CT CERVICAL SPINE WITHOUT CONTRAST TECHNIQUE: Multidetector CT imaging of the head and cervical spine was  performed following the standard protocol without intravenous contrast. Multiplanar CT image reconstructions of the cervical spine were also generated. COMPARISON:  Head CT dated 03/20/2019. FINDINGS: CT HEAD FINDINGS Brain: There is mild age-related atrophy and chronic microvascular ischemic changes. Small old right basal ganglia lacunar infarct. There is no acute intracranial hemorrhage. No mass effect or midline shift. No extra-axial fluid collection. Vascular: No hyperdense vessel or unexpected calcification. Skull: Normal. Negative for fracture or focal lesion. Sinuses/Orbits: No acute finding. Other: None CT CERVICAL SPINE FINDINGS Alignment: No acute subluxation. There is reversal of normal cervical lordosis which may be positional or due to muscle spasm. Skull base and vertebrae: No acute fracture. Osteopenia. There is incomplete bony fusion of the posterior ring of C1. Soft tissues and spinal canal: No prevertebral fluid or swelling. No visible canal hematoma. Disc levels:  Multilevel degenerative changes. Upper chest: Negative. Other: Partially visualized pacemaker wire. Atherosclerotic calcification of the aortic arch. Bilateral carotid bulb calcified plaques. IMPRESSION: 1. No acute intracranial pathology. 2. Mild age-related atrophy and  chronic microvascular ischemic changes. 3. No acute/traumatic cervical spine pathology. 4. Aortic Atherosclerosis (ICD10-I70.0). Electronically Signed   By: Elgie Collard M.D.   On: 05/26/2019 21:42   DG Chest Portable 1 View  Result Date: 05/26/2019 CLINICAL DATA:  Weakness EXAM: PORTABLE CHEST 1 VIEW COMPARISON:  03/20/2019 FINDINGS: Left pacer remains in place, unchanged. Bilateral perihilar and lower lobe airspace opacities concerning for edema. Possible layering effusions. No acute bony abnormality. IMPRESSION: Bilateral airspace opacities concerning for edema. Suspect small layering effusions. Electronically Signed   By: Charlett Nose M.D.   On: 05/26/2019  21:43        Scheduled Meds: . enoxaparin (LOVENOX) injection  30 mg Subcutaneous Q24H  . insulin aspart  0-15 Units Subcutaneous TID WC  . ipratropium-albuterol  3 mL Nebulization Once   Continuous Infusions: . sodium chloride 75 mL/hr at 05/27/19 0703     LOS: 1 day    Time spent:    Erick Blinks, MD [facility]   If 7PM-7AM, please contact night-coverage [email or web address]  05/27/2019, 12:01 PM

## 2019-05-27 NOTE — ED Notes (Signed)
Patient taken into room and wet diaper changed.  Her buttocks and coccyx skin area is intact and looks okay.

## 2019-05-28 LAB — BASIC METABOLIC PANEL
Anion gap: 8 (ref 5–15)
BUN: 25 mg/dL — ABNORMAL HIGH (ref 8–23)
CO2: 24 mmol/L (ref 22–32)
Calcium: 9.4 mg/dL (ref 8.9–10.3)
Chloride: 106 mmol/L (ref 98–111)
Creatinine, Ser: 1.23 mg/dL — ABNORMAL HIGH (ref 0.44–1.00)
GFR calc Af Amer: 50 mL/min — ABNORMAL LOW (ref >60)
GFR calc non Af Amer: 43 mL/min — ABNORMAL LOW (ref >60)
Glucose, Bld: 136 mg/dL — ABNORMAL HIGH (ref 70–99)
Potassium: 4.1 mmol/L (ref 3.5–5.1)
Sodium: 138 mmol/L (ref 135–145)

## 2019-05-28 LAB — CBC
HCT: 33 % — ABNORMAL LOW (ref 36.0–46.0)
Hemoglobin: 10.7 g/dL — ABNORMAL LOW (ref 12.0–15.0)
MCH: 31.1 pg (ref 26.0–34.0)
MCHC: 32.4 g/dL (ref 30.0–36.0)
MCV: 95.9 fL (ref 80.0–100.0)
Platelets: 288 10*3/uL (ref 150–400)
RBC: 3.44 MIL/uL — ABNORMAL LOW (ref 3.87–5.11)
RDW: 14.7 % (ref 11.5–15.5)
WBC: 9.2 10*3/uL (ref 4.0–10.5)
nRBC: 0 % (ref 0.0–0.2)

## 2019-05-28 LAB — HEMOGLOBIN A1C
Hgb A1c MFr Bld: 7.1 % — ABNORMAL HIGH (ref 4.8–5.6)
Mean Plasma Glucose: 157.07 mg/dL

## 2019-05-28 LAB — GLUCOSE, CAPILLARY
Glucose-Capillary: 147 mg/dL — ABNORMAL HIGH (ref 70–99)
Glucose-Capillary: 215 mg/dL — ABNORMAL HIGH (ref 70–99)

## 2019-05-28 MED ORDER — ENOXAPARIN SODIUM 40 MG/0.4ML ~~LOC~~ SOLN: 40.0000 mg | SUBCUTANEOUS | Status: DC

## 2019-05-28 MED ORDER — LEVOTHYROXINE SODIUM 125 MCG PO TABS: 125.0000 ug | ORAL_TABLET | Freq: Every day | ORAL | 0 refills | Status: DC

## 2019-05-28 MED ORDER — TRAMADOL HCL 50 MG PO TABS: 50.0000 mg | ORAL_TABLET | Freq: Two times a day (BID) | ORAL | 0 refills | Status: DC | PRN

## 2019-05-28 MED ORDER — METFORMIN HCL 1000 MG PO TABS: 500.0000 mg | ORAL_TABLET | Freq: Two times a day (BID) | ORAL | 0 refills | Status: AC

## 2019-05-28 NOTE — Plan of Care (Signed)
Discharge to home. Cleared by physical therapy. No needs, no equipment. Provider notified. Waiting for discharge summary to be completed and for patient's spouse to arrive.

## 2019-05-28 NOTE — Evaluation (Signed)
Physical Therapy Evaluation Patient Details Name: Melanie King MRN: [id number] DOB: 01/02/1946 Today's Date: 05/28/2019   History of Present Illness  Pt admitted for acute metabolic encephalopathy with compalints of weakness, falls, and confusion. History includes DM, HTN, CHF, CAD, pacemaker, and C6 fracture in Dec 2020 and subdural hematoma in Jan 2021.   Clinical Impression  Pt is a pleasant 74 year old female who was admitted for acute metabolic encephalopathy. Pt performs bed mobility with independence, transfers with mod I, and ambulation with cga and RW. Pt demonstrates deficits with strength/mobility/cognition. Would benefit from skilled PT to address above deficits and promote optimal return to PLOF. Recommend transition to [place] upon discharge from acute hospitalization.     Follow Up Recommendations Home health PT    Equipment Recommendations  None recommended by PT(has all equip)    Recommendations for Other Services       Precautions / Restrictions Precautions Precautions: Fall Restrictions Weight Bearing Restrictions: No      Mobility  Bed Mobility Overal bed mobility: Independent             General bed mobility comments: somewhat impulsive and once seated at EOB, able to sit with upright posture.   Transfers Overall transfer level: Modified independent Equipment used: Rolling walker (2 wheeled)             General transfer comment: safe technique performed with upright posture and RW. No LOB noted  Ambulation/Gait Ambulation/Gait assistance: Min guard Gait Distance (Feet): 300 Feet Assistive device: Rolling walker (2 wheeled) Gait Pattern/deviations: Step-through pattern     General Gait Details: ambulated around RN station twice with ability to carry conversation and reciprocal gait pattern. Safe technique using RW. No SOB symptoms noted. Cues given for directions/sequencing  Stairs            Wheelchair Mobility    Modified  Rankin (Stroke Patients Only)       Balance Overall balance assessment: History of Falls;Needs assistance Sitting-balance support: Feet supported;Bilateral upper extremity supported Sitting balance-Leahy Scale: Good     Standing balance support: Bilateral upper extremity supported Standing balance-Leahy Scale: Good                               Pertinent Vitals/Pain Pain Assessment: No/denies pain    Home Living Family/patient expects to be discharged to:: Private residence Living Arrangements: Non-relatives/Friends Available Help at Discharge: Friend(s);Available PRN/intermittently Type of Home: Apartment Home Access: Stairs to enter Entrance Stairs-Rails: None Entrance Stairs-Number of Steps: 1 Home Layout: One level Home Equipment: Walker - 4 wheels;Cane - single point Additional Comments: Pt reports she lives with her friend Melanie King, however she is looking into moving into an ALF for increased assist/supervision.    Prior Function Level of Independence: Independent with assistive device(s)         Comments: Pt reports she uses rollater for all mobility. Reports multiple falls.     Hand Dominance        Extremity/Trunk Assessment   Upper Extremity Assessment Upper Extremity Assessment: Overall WFL for tasks assessed    Lower Extremity Assessment Lower Extremity Assessment: Overall WFL for tasks assessed       Communication   Communication: HOH  Cognition Arousal/Alertness: Awake/alert Behavior During Therapy: WFL for tasks assessed/performed Overall Cognitive Status: Within Functional Limits for tasks assessed  General Comments      Exercises     Assessment/Plan    PT Assessment Patient needs continued PT services  PT Problem List Decreased strength;Decreased activity tolerance;Decreased balance;Decreased mobility;Decreased safety awareness       PT Treatment Interventions  Gait training;DME instruction;Therapeutic exercise;Balance training    PT Goals (Current goals can be found in the Care Plan section)  Acute Rehab PT Goals Patient Stated Goal: to go home PT Goal Formulation: With patient Time For Goal Achievement: 06/11/19 Potential to Achieve Goals: Good    Frequency Min 2X/week   Barriers to discharge        Co-evaluation               AM-PAC PT "6 Clicks" Mobility  Outcome Measure Help needed turning from your back to your side while in a flat bed without using bedrails?: None Help needed moving from lying on your back to sitting on the side of a flat bed without using bedrails?: None Help needed moving to and from a bed to a chair (including a wheelchair)?: A Little Help needed standing up from a chair using your arms (e.g., wheelchair or bedside chair)?: A Little Help needed to walk in hospital room?: A Little Help needed climbing 3-5 steps with a railing? : A Little 6 Click Score: 20    End of Session Equipment Utilized During Treatment: Gait belt Activity Tolerance: Patient tolerated treatment well Patient left: in bed(seated on EOB with RN staff fixing tele leads) Nurse Communication: Mobility status PT Visit Diagnosis: Muscle weakness (generalized) (M62.81);Difficulty in walking, not elsewhere classified (R26.2)    Time: 0240-9735 PT Time Calculation (min) (ACUTE ONLY): 26 min   Charges:   PT Evaluation $PT Eval Moderate Complexity: 1 Mod PT Treatments $Gait Training: 8-22 mins        Elizabeth Palau, PT, DPT [phone]   [name] 05/28/2019, 2:25 PM

## 2019-05-28 NOTE — Discharge Summary (Addendum)
Physician Discharge Summary  [place] F Tuccillo JSH:[id number] DOB: Jan 31, 1946 DOA: 05/26/2019  PCP: Patient, No Pcp Per  Admit date: 05/26/2019 Discharge date: 05/28/2019  Admitted From: Home Disposition: Home  Recommendations for Outpatient Follow-up:  1. Follow-up with PCP in 1-2 weeks.  Patient wishes to go and live in assisted living and can be referred during outpatient follow-up.  Home Health: Per physical therapy Equipment/Devices: None  Discharge Condition: Fair CODE STATUS: Full code Diet recommendation: Heart Healthy / Carb Modified    Discharge Diagnoses:  Principal Problem:   Acute metabolic encephalopathy  Active Problems:   Hypothyroidism Polypharmacy   CAD (coronary artery disease)   Hypertension   Type II diabetes mellitus with renal manifestations (HCC)   Tremor   AKI (acute kidney injury) (HCC)   Frequent falls   Generalized weakness  Brief narrative/HPI 74 year old female with a history of diabetes, hypertension, hypothyroidism, who was admitted in 03/2019 after a fall and subdural hematoma.  She was discharged to a skilled nursing facility.  It is unclear how long she was there.  She returned to the hospital today with recurrent falls and altered mental status.  Her significant other reports that patient has been more confused lately and has been falling more frequently.  Her CT head was negative.  She did not have any neurologic deficits.  It was felt that her symptoms may be related to polypharmacy.  Hospital course  Principal problem Acute metabolic encephalopathy Unclear etiology.  Suspect polypharmacy versus hypothyroidism with persistently elevated TSH. No focal deficit on exam.  Head CT unremarkable.  Encephalopathy now resolved and well oriented.   Patient is on multiple antidepressants and also on tramadol and gabapentin for pain. I have reduced her tramadol dose to 50 mg twice daily as needed (was on 3 times a day as needed).  Her gabapentin  dose needs to be reduced during outpatient follow-up.  Also her antidepressant dose can be adjusted/reduced if possible.  Patient stable to be discharged home.  Awaiting PT evaluation prior to discharge and add home health if necessary.  Patient refuses to go to SNF even if indicated.  Active problems Acute kidney injury (HCC) Possibly prerenal ATN.  Received gentle IV fluids.  Metformin and losartan was held.  Follow-up labs showed improving renal function without intervention.  Hypothyroidism TSH elevated at 61.  Was 65 2 months ago.  Levothyroxine dose increased from 100 225 mcg daily.  Recommend repeat TSH in 4-6 weeks.  Coronary artery disease Stable.  Continue beta-blocker and statin.  Essential hypertension Stable.  Continue amlodipine and beta-blocker.  Losartan resumed as renal function better.  Diabetes mellitus type 2, controlled Metformin was held due to AKI, will reduce dose to 5 mg twice daily upon discharge as A1c is controlled at 7.  Continue Lantus at home dose and sitagliptin upon discharge. Frequent falls Head CT negative.  PT eval prior to discharge.  Disposition: Home Family communication: Friend at bedside Procedure: Head CT, 2D echo, carotid Doppler  Discharge Instructions   Allergies as of 05/28/2019      Reactions   Alprazolam Swelling   Fentanyl Other (See Comments)   "burning and hot"   Meperidine Itching   Other reaction(s): Other (See Comments)   Ranitidine Hcl    Other reaction(s): Other (See Comments)      Medication List    TAKE these medications   amLODipine 10 MG tablet Commonly known as: NORVASC TAKE 1 TABLET BY MOUTH EVERY DAY   aspirin EC  81 MG tablet Take 81 mg by mouth daily.   benztropine 0.5 MG tablet Commonly known as: COGENTIN Take 0.5 mg by mouth 2 (two) times daily.   calcitRIOL 0.25 MCG capsule Commonly known as: ROCALTROL Take 0.25 mcg by mouth daily.   cholecalciferol 25 MCG (1000 UNIT) tablet Commonly known as:  VITAMIN D3 Take 1,000 Units by mouth daily.   citalopram 20 MG tablet Commonly known as: CELEXA Take 20 mg by mouth daily.   famotidine 40 MG tablet Commonly known as: PEPCID Take 40 mg by mouth daily.   fenofibrate 48 MG tablet Commonly known as: TRICOR Take 48 mg by mouth daily.   furosemide 20 MG tablet Commonly known as: LASIX Take 20 mg by mouth daily.   gabapentin 100 MG capsule Commonly known as: NEURONTIN Take 100-300 mg by mouth See admin instructions. Take 1 capsule (100mg ) by mouth every morning and daily at noon and take 3 capsules (300mg ) by mouth every night   Lantus SoloStar 100 UNIT/ML Solostar Pen Generic drug: insulin glargine Inject 24 Units into the skin at bedtime.   levothyroxine 125 MCG tablet Commonly known as: SYNTHROID Take 1 tablet (125 mcg total) by mouth daily before breakfast. What changed:   medication strength  how much to take   losartan 50 MG tablet Commonly known as: COZAAR Take 50 mg by mouth daily.   metFORMIN 1000 MG tablet Commonly known as: GLUCOPHAGE Take 500 mg by mouth 2 (two) times daily with a meal.  (Dose reduced from 1000 mg twice daily)   metoprolol tartrate 25 MG tablet Commonly known as: LOPRESSOR Take 25 mg by mouth 2 (two) times daily.   montelukast 10 MG tablet Commonly known as: SINGULAIR Take 10 mg by mouth at bedtime.   nicotine 21 mg/24hr patch Commonly known as: NICODERM CQ - dosed in mg/24 hours Place 1 patch (21 mg total) onto the skin daily.   nitroGLYCERIN 0.2 mg/hr patch Commonly known as: NITRODUR - Dosed in mg/24 hr Place 0.2 mg onto the skin daily. leave patch on 12-14 hours, then remove for 10-12 hours prior to applying the next patch   primidone 50 MG tablet Commonly known as: MYSOLINE Take 50 mg by mouth 2 (two) times daily.   Rexulti 1 MG Tabs tablet Generic drug: brexpiprazole Take 1 mg by mouth at bedtime.   rosuvastatin 40 MG tablet Commonly known as: CRESTOR Take 40 mg by  mouth at bedtime.   sitaGLIPtin 50 MG tablet Commonly known as: JANUVIA Take 50 mg by mouth daily.   traMADol 50 MG tablet Commonly known as: ULTRAM Take 1 tablet (50 mg total) by mouth every 12 (twelve) hours as needed for moderate pain. What changed: when to take this   vitamin B-12 1000 MCG tablet Commonly known as: CYANOCOBALAMIN Take 1,000 mcg by mouth daily.      Follow-up Information    PCP IN 1 WEEK. Schedule an appointment as soon as possible for a visit in 1 week(s).          Allergies  Allergen Reactions  . Alprazolam Swelling  . Fentanyl Other (See Comments)    "burning and hot"  . Meperidine Itching    Other reaction(s): Other (See Comments)  . Ranitidine Hcl     Other reaction(s): Other (See Comments)       Procedures/Studies: CT Head Wo Contrast  Result Date: 05/26/2019 CLINICAL DATA:  74 year old female with head trauma. EXAM: CT HEAD WITHOUT CONTRAST CT CERVICAL SPINE WITHOUT CONTRAST  TECHNIQUE: Multidetector CT imaging of the head and cervical spine was performed following the standard protocol without intravenous contrast. Multiplanar CT image reconstructions of the cervical spine were also generated. COMPARISON:  Head CT dated 03/20/2019. FINDINGS: CT HEAD FINDINGS Brain: There is mild age-related atrophy and chronic microvascular ischemic changes. Small old right basal ganglia lacunar infarct. There is no acute intracranial hemorrhage. No mass effect or midline shift. No extra-axial fluid collection. Vascular: No hyperdense vessel or unexpected calcification. Skull: Normal. Negative for fracture or focal lesion. Sinuses/Orbits: No acute finding. Other: None CT CERVICAL SPINE FINDINGS Alignment: No acute subluxation. There is reversal of normal cervical lordosis which may be positional or due to muscle spasm. Skull base and vertebrae: No acute fracture. Osteopenia. There is incomplete bony fusion of the posterior ring of C1. Soft tissues and spinal canal:  No prevertebral fluid or swelling. No visible canal hematoma. Disc levels:  Multilevel degenerative changes. Upper chest: Negative. Other: Partially visualized pacemaker wire. Atherosclerotic calcification of the aortic arch. Bilateral carotid bulb calcified plaques. IMPRESSION: 1. No acute intracranial pathology. 2. Mild age-related atrophy and chronic microvascular ischemic changes. 3. No acute/traumatic cervical spine pathology. 4. Aortic Atherosclerosis (ICD10-I70.0). Electronically Signed   By: Elgie Collard M.D.   On: 05/26/2019 21:42   CT Cervical Spine Wo Contrast  Result Date: 05/26/2019 CLINICAL DATA:  74 year old female with head trauma. EXAM: CT HEAD WITHOUT CONTRAST CT CERVICAL SPINE WITHOUT CONTRAST TECHNIQUE: Multidetector CT imaging of the head and cervical spine was performed following the standard protocol without intravenous contrast. Multiplanar CT image reconstructions of the cervical spine were also generated. COMPARISON:  Head CT dated 03/20/2019. FINDINGS: CT HEAD FINDINGS Brain: There is mild age-related atrophy and chronic microvascular ischemic changes. Small old right basal ganglia lacunar infarct. There is no acute intracranial hemorrhage. No mass effect or midline shift. No extra-axial fluid collection. Vascular: No hyperdense vessel or unexpected calcification. Skull: Normal. Negative for fracture or focal lesion. Sinuses/Orbits: No acute finding. Other: None CT CERVICAL SPINE FINDINGS Alignment: No acute subluxation. There is reversal of normal cervical lordosis which may be positional or due to muscle spasm. Skull base and vertebrae: No acute fracture. Osteopenia. There is incomplete bony fusion of the posterior ring of C1. Soft tissues and spinal canal: No prevertebral fluid or swelling. No visible canal hematoma. Disc levels:  Multilevel degenerative changes. Upper chest: Negative. Other: Partially visualized pacemaker wire. Atherosclerotic calcification of the aortic arch.  Bilateral carotid bulb calcified plaques. IMPRESSION: 1. No acute intracranial pathology. 2. Mild age-related atrophy and chronic microvascular ischemic changes. 3. No acute/traumatic cervical spine pathology. 4. Aortic Atherosclerosis (ICD10-I70.0). Electronically Signed   By: Elgie Collard M.D.   On: 05/26/2019 21:42   DG Chest Portable 1 View  Result Date: 05/26/2019 CLINICAL DATA:  Weakness EXAM: PORTABLE CHEST 1 VIEW COMPARISON:  03/20/2019 FINDINGS: Left pacer remains in place, unchanged. Bilateral perihilar and lower lobe airspace opacities concerning for edema. Possible layering effusions. No acute bony abnormality. IMPRESSION: Bilateral airspace opacities concerning for edema. Suspect small layering effusions. Electronically Signed   By: Charlett Nose M.D.   On: 05/26/2019 21:43       Subjective: No overnight events.  Patient communicating well and wants to go home.  Discharge Exam: Vitals:   05/27/19 2310 05/28/19 0750  BP:  (!) 175/73  Pulse:  70  Resp:  16  Temp: 98.4 F (36.9 C) 98.3 F (36.8 C)  SpO2:  100%   Vitals:   05/27/19 1955 05/27/19 2035  05/27/19 2310 05/28/19 0750  BP:    (!) 175/73  Pulse:    70  Resp:    16  Temp:  98.8 F (37.1 C) 98.4 F (36.9 C) 98.3 F (36.8 C)  TempSrc:  Oral Oral Oral  SpO2:    100%  Weight: 66.8 kg     Height: 5\' 1"  (1.549 m)       General: Elderly female not in distress, hard of hearing HEENT: Moist mucosa, supple neck Chest: Clear CVs: Normal S1-S2 GI: Soft, nondistended, nontender Musculoskeletal: Warm, no edema CNs: Alert and oriented x3     The results of significant diagnostics from this hospitalization (including imaging, microbiology, ancillary and laboratory) are listed below for reference.     Microbiology: Recent Results (from the past 240 hour(s))  SARS CORONAVIRUS 2 (TAT 6-24 HRS) Nasopharyngeal     Status: None   Collection Time: 05/26/19 10:02 PM   Specimen: Nasopharyngeal  Result Value Ref  Range Status   SARS Coronavirus 2 NEGATIVE NEGATIVE Final    Comment: (NOTE) SARS-CoV-2 target nucleic acids are NOT DETECTED. The SARS-CoV-2 RNA is generally detectable in upper and lower respiratory specimens during the acute phase of infection. Negative results do not preclude SARS-CoV-2 infection, do not rule out co-infections with other pathogens, and should not be used as the sole basis for treatment or other patient management decisions. Negative results must be combined with clinical observations, patient history, and epidemiological information. The expected result is Negative. Fact Sheet for Patients: SugarRoll.be Fact Sheet for Healthcare Providers: https://www.woods-mathews.com/ This test is not yet approved or cleared by the [place] FDA and  has been authorized for detection and/or diagnosis of SARS-CoV-2 by FDA under an Emergency Use Authorization (EUA). This EUA will remain  in effect (meaning this test can be used) for the duration of the COVID-19 declaration under Section 56 4(b)(1) of the Act, 21 U.S.C. section 360bbb-3(b)(1), unless the authorization is terminated or revoked sooner. Performed at [facility], [place] [address] [place], [place] [address]      Labs: BNP (last 3 results) Recent Labs    03/10/19 1343 05/26/19 1857  BNP 156.0* 604.5*   Basic Metabolic Panel: Recent Labs  Lab 05/26/19 1857 05/28/19 0447  NA 136 138  K 4.1 4.1  CL 104 106  CO2 23 24  GLUCOSE 74 136*  BUN 26* 25*  CREATININE 1.65* 1.23*  CALCIUM 8.9 9.4   Liver Function Tests: Recent Labs  Lab 05/26/19 1857  AST 18  ALT 15  ALKPHOS 56  BILITOT 0.6  PROT 8.1  ALBUMIN 4.4   No results for input(s): LIPASE, AMYLASE in the last 168 hours. No results for input(s): AMMONIA in the last 168 hours. CBC: Recent Labs  Lab 05/26/19 1857 05/28/19 0447  WBC 8.9 9.2  HGB 10.9* 10.7*  HCT 33.3* 33.0*  MCV 95.4 95.9   PLT 334 288   Cardiac Enzymes: No results for input(s): CKTOTAL, CKMB, CKMBINDEX, TROPONINI in the last 168 hours. BNP: Invalid input(s): POCBNP CBG: Recent Labs  Lab 05/27/19 1247 05/27/19 1848 05/27/19 2125 05/28/19 0749 05/28/19 1120  GLUCAP 195* 187* 166* 147* 215*   D-Dimer No results for input(s): DDIMER in the last 72 hours. Hgb A1c Recent Labs    05/27/19 2143  HGBA1C 7.1*   Lipid Profile No results for input(s): CHOL, HDL, LDLCALC, TRIG, CHOLHDL, LDLDIRECT in the last 72 hours. Thyroid function studies Recent Labs    05/26/19 1857  TSH 61.000*  Anemia work up No results for input(s): VITAMINB12, FOLATE, FERRITIN, TIBC, IRON, RETICCTPCT in the last 72 hours. Urinalysis    Component Value Date/Time   COLORURINE YELLOW (A) 05/26/2019 2202   APPEARANCEUR CLEAR (A) 05/26/2019 2202   APPEARANCEUR Clear 03/24/2013 1530   LABSPEC 1.005 05/26/2019 2202   LABSPEC 1.009 03/24/2013 1530   PHURINE 6.0 05/26/2019 2202   GLUCOSEU NEGATIVE 05/26/2019 2202   GLUCOSEU 50 mg/dL 82/50/5397 6734   HGBUR NEGATIVE 05/26/2019 2202   BILIRUBINUR NEGATIVE 05/26/2019 2202   BILIRUBINUR Negative 03/24/2013 1530   KETONESUR NEGATIVE 05/26/2019 2202   PROTEINUR NEGATIVE 05/26/2019 2202   NITRITE NEGATIVE 05/26/2019 2202   LEUKOCYTESUR NEGATIVE 05/26/2019 2202   LEUKOCYTESUR Negative 03/24/2013 1530   Sepsis Labs Invalid input(s): PROCALCITONIN,  WBC,  LACTICIDVEN Microbiology Recent Results (from the past 240 hour(s))  SARS CORONAVIRUS 2 (TAT 6-24 HRS) Nasopharyngeal     Status: None   Collection Time: 05/26/19 10:02 PM   Specimen: Nasopharyngeal  Result Value Ref Range Status   SARS Coronavirus 2 NEGATIVE NEGATIVE Final    Comment: (NOTE) SARS-CoV-2 target nucleic acids are NOT DETECTED. The SARS-CoV-2 RNA is generally detectable in upper and lower respiratory specimens during the acute phase of infection. Negative results do not preclude SARS-CoV-2 infection, do not  rule out co-infections with other pathogens, and should not be used as the sole basis for treatment or other patient management decisions. Negative results must be combined with clinical observations, patient history, and epidemiological information. The expected result is Negative. Fact Sheet for Patients: HairSlick.no Fact Sheet for Healthcare Providers: quierodirigir.com This test is not yet approved or cleared by the [place] FDA and  has been authorized for detection and/or diagnosis of SARS-CoV-2 by FDA under an Emergency Use Authorization (EUA). This EUA will remain  in effect (meaning this test can be used) for the duration of the COVID-19 declaration under Section 56 4(b)(1) of the Act, 21 U.S.C. section 360bbb-3(b)(1), unless the authorization is terminated or revoked sooner. Performed at [facility], [address], [place], [place] [address]      Time coordinating discharge: 35 minutes  SIGNED:   Eddie North, MD  [facility] 05/28/2019, 1:13 PM Pager   If 7PM-7AM, please contact night-coverage [email or web address] Password [email or web address]

## 2019-05-31 ENCOUNTER — Emergency Department: Payer: Medicare Other

## 2019-05-31 ENCOUNTER — Emergency Department
Admission: EM | Admit: 2019-05-31 | Discharge: 2019-06-01 | Disposition: A | Discharge status: Home / Self Care | Payer: Medicare Other | Attending: Emergency Medicine | Admitting: Emergency Medicine

## 2019-05-31 ENCOUNTER — Other Ambulatory Visit: Payer: Self-pay

## 2019-05-31 DIAGNOSIS — Z20822 Contact with and (suspected) exposure to covid-19: Secondary | ICD-10-CM | POA: Diagnosis not present

## 2019-05-31 DIAGNOSIS — I110 Hypertensive heart disease with heart failure: Secondary | ICD-10-CM | POA: Insufficient documentation

## 2019-05-31 DIAGNOSIS — E119 Type 2 diabetes mellitus without complications: Secondary | ICD-10-CM | POA: Diagnosis not present

## 2019-05-31 DIAGNOSIS — Y92039 Unspecified place in apartment as the place of occurrence of the external cause: Secondary | ICD-10-CM | POA: Diagnosis not present

## 2019-05-31 DIAGNOSIS — M25552 Pain in left hip: Secondary | ICD-10-CM | POA: Diagnosis present

## 2019-05-31 DIAGNOSIS — Z950 Presence of cardiac pacemaker: Secondary | ICD-10-CM | POA: Diagnosis not present

## 2019-05-31 DIAGNOSIS — I2510 Atherosclerotic heart disease of native coronary artery without angina pectoris: Secondary | ICD-10-CM | POA: Insufficient documentation

## 2019-05-31 DIAGNOSIS — Y999 Unspecified external cause status: Secondary | ICD-10-CM | POA: Insufficient documentation

## 2019-05-31 DIAGNOSIS — F17210 Nicotine dependence, cigarettes, uncomplicated: Secondary | ICD-10-CM | POA: Diagnosis not present

## 2019-05-31 DIAGNOSIS — R296 Repeated falls: Secondary | ICD-10-CM | POA: Insufficient documentation

## 2019-05-31 DIAGNOSIS — R531 Weakness: Secondary | ICD-10-CM | POA: Diagnosis not present

## 2019-05-31 DIAGNOSIS — I5032 Chronic diastolic (congestive) heart failure: Secondary | ICD-10-CM | POA: Insufficient documentation

## 2019-05-31 DIAGNOSIS — J449 Chronic obstructive pulmonary disease, unspecified: Secondary | ICD-10-CM | POA: Diagnosis not present

## 2019-05-31 LAB — COMPREHENSIVE METABOLIC PANEL
ALT: 16 U/L (ref 0–44)
AST: 21 U/L (ref 15–41)
Albumin: 3.9 g/dL (ref 3.5–5.0)
Alkaline Phosphatase: 63 U/L (ref 38–126)
Anion gap: 10 (ref 5–15)
BUN: 27 mg/dL — ABNORMAL HIGH (ref 8–23)
CO2: 20 mmol/L — ABNORMAL LOW (ref 22–32)
Calcium: 9 mg/dL (ref 8.9–10.3)
Chloride: 108 mmol/L (ref 98–111)
Creatinine, Ser: 1.79 mg/dL — ABNORMAL HIGH (ref 0.44–1.00)
GFR calc Af Amer: 32 mL/min — ABNORMAL LOW (ref >60)
GFR calc non Af Amer: 28 mL/min — ABNORMAL LOW (ref >60)
Glucose, Bld: 193 mg/dL — ABNORMAL HIGH (ref 70–99)
Potassium: 3.8 mmol/L (ref 3.5–5.1)
Sodium: 138 mmol/L (ref 135–145)
Total Bilirubin: 0.5 mg/dL (ref 0.3–1.2)
Total Protein: 7.2 g/dL (ref 6.5–8.1)

## 2019-05-31 LAB — CBC WITH DIFFERENTIAL/PLATELET
Abs Immature Granulocytes: 0.05 10*3/uL (ref 0.00–0.07)
Basophils Absolute: 0.1 10*3/uL (ref 0.0–0.1)
Basophils Relative: 1 %
Eosinophils Absolute: 0 10*3/uL (ref 0.0–0.5)
Eosinophils Relative: 0 %
HCT: 32.5 % — ABNORMAL LOW (ref 36.0–46.0)
Hemoglobin: 10.9 g/dL — ABNORMAL LOW (ref 12.0–15.0)
Immature Granulocytes: 1 %
Lymphocytes Relative: 21 %
Lymphs Abs: 2.1 10*3/uL (ref 0.7–4.0)
MCH: 31.5 pg (ref 26.0–34.0)
MCHC: 33.5 g/dL (ref 30.0–36.0)
MCV: 93.9 fL (ref 80.0–100.0)
Monocytes Absolute: 0.7 10*3/uL (ref 0.1–1.0)
Monocytes Relative: 7 %
Neutro Abs: 7.1 10*3/uL (ref 1.7–7.7)
Neutrophils Relative %: 70 %
Platelets: 301 10*3/uL (ref 150–400)
RBC: 3.46 MIL/uL — ABNORMAL LOW (ref 3.87–5.11)
RDW: 15 % (ref 11.5–15.5)
WBC: 10 10*3/uL (ref 4.0–10.5)
nRBC: 0 % (ref 0.0–0.2)

## 2019-05-31 LAB — TROPONIN I (HIGH SENSITIVITY): Troponin I (High Sensitivity): 12 ng/L (ref <18)

## 2019-05-31 LAB — RESPIRATORY PANEL BY RT PCR (FLU A&B, COVID)
Influenza A by PCR: NEGATIVE
Influenza B by PCR: NEGATIVE
SARS Coronavirus 2 by RT PCR: NEGATIVE

## 2019-05-31 MED ORDER — LOSARTAN POTASSIUM 50 MG PO TABS
50.0000 mg | ORAL_TABLET | Freq: Every day | ORAL | Status: DC
  Administered 2019-05-31 – 2019-06-01 (×2): 50 mg via ORAL
  Filled 2019-05-31 (×2): qty 1

## 2019-05-31 MED ORDER — METOPROLOL TARTRATE 25 MG PO TABS
25.0000 mg | ORAL_TABLET | Freq: Two times a day (BID) | ORAL | Status: DC
  Administered 2019-05-31 – 2019-06-01 (×2): 25 mg via ORAL
  Filled 2019-05-31 (×2): qty 1

## 2019-05-31 MED ORDER — LINAGLIPTIN 5 MG PO TABS: 5.0000 mg | ORAL_TABLET | Freq: Every day | ORAL | Status: DC

## 2019-05-31 MED ORDER — CALCITRIOL 0.25 MCG PO CAPS
0.2500 ug | ORAL_CAPSULE | Freq: Every day | ORAL | Status: DC
  Filled 2019-05-31: qty 1

## 2019-05-31 MED ORDER — CITALOPRAM HYDROBROMIDE 20 MG PO TABS
20.0000 mg | ORAL_TABLET | Freq: Every day | ORAL | Status: DC
  Administered 2019-05-31 – 2019-06-01 (×2): 20 mg via ORAL
  Filled 2019-05-31 (×2): qty 1

## 2019-05-31 MED ORDER — SODIUM CHLORIDE 0.9 % IV BOLUS
500.0000 mL | Freq: Once | INTRAVENOUS | Status: AC
  Administered 2019-05-31: 500 mL via INTRAVENOUS

## 2019-05-31 MED ORDER — NICOTINE 21 MG/24HR TD PT24
21.0000 mg | MEDICATED_PATCH | Freq: Every day | TRANSDERMAL | Status: DC
  Administered 2019-06-01: 21 mg via TRANSDERMAL
  Filled 2019-05-31: qty 1

## 2019-05-31 MED ORDER — NITROGLYCERIN 0.2 MG/HR TD PT24
0.2000 mg | MEDICATED_PATCH | Freq: Every day | TRANSDERMAL | Status: DC
  Filled 2019-05-31: qty 1

## 2019-05-31 MED ORDER — MONTELUKAST SODIUM 10 MG PO TABS: 10.0000 mg | ORAL_TABLET | Freq: Every day | ORAL | Status: DC

## 2019-05-31 MED ORDER — FUROSEMIDE 40 MG PO TABS
20.0000 mg | ORAL_TABLET | Freq: Every day | ORAL | Status: DC
  Administered 2019-05-31 – 2019-06-01 (×2): 20 mg via ORAL
  Filled 2019-05-31 (×2): qty 1

## 2019-05-31 MED ORDER — ASPIRIN EC 81 MG PO TBEC
81.0000 mg | DELAYED_RELEASE_TABLET | Freq: Every day | ORAL | Status: DC
  Administered 2019-05-31 – 2019-06-01 (×2): 81 mg via ORAL
  Filled 2019-05-31 (×2): qty 1

## 2019-05-31 MED ORDER — FAMOTIDINE 20 MG PO TABS
40.0000 mg | ORAL_TABLET | Freq: Every day | ORAL | Status: DC
  Administered 2019-05-31: 40 mg via ORAL
  Filled 2019-05-31: qty 2

## 2019-05-31 MED ORDER — VITAMIN D 25 MCG (1000 UNIT) PO TABS
1000.0000 [IU] | ORAL_TABLET | Freq: Every day | ORAL | Status: DC
  Administered 2019-05-31 – 2019-06-01 (×2): 1000 [IU] via ORAL
  Filled 2019-05-31 (×2): qty 1

## 2019-05-31 MED ORDER — BENZTROPINE MESYLATE 1 MG PO TABS
0.5000 mg | ORAL_TABLET | Freq: Two times a day (BID) | ORAL | Status: DC
  Administered 2019-05-31 – 2019-06-01 (×2): 0.5 mg via ORAL
  Filled 2019-05-31 (×2): qty 1

## 2019-05-31 MED ORDER — METFORMIN HCL 500 MG PO TABS
500.0000 mg | ORAL_TABLET | Freq: Two times a day (BID) | ORAL | Status: DC
  Filled 2019-05-31: qty 1

## 2019-05-31 MED ORDER — GABAPENTIN 100 MG PO CAPS: 100.0000 mg | ORAL_CAPSULE | ORAL | Status: DC

## 2019-05-31 MED ORDER — GABAPENTIN 100 MG PO CAPS: 100.0000 mg | ORAL_CAPSULE | Freq: Every day | ORAL | Status: DC

## 2019-05-31 MED ORDER — INSULIN GLARGINE 100 UNIT/ML ~~LOC~~ SOLN
12.0000 [IU] | Freq: Every day | SUBCUTANEOUS | Status: DC
  Administered 2019-06-01: 12 [IU] via SUBCUTANEOUS
  Filled 2019-05-31 (×2): qty 0.12

## 2019-05-31 MED ORDER — VITAMIN B-12 1000 MCG PO TABS
1000.0000 ug | ORAL_TABLET | Freq: Every day | ORAL | Status: DC
  Filled 2019-05-31: qty 1

## 2019-05-31 MED ORDER — AMLODIPINE BESYLATE 5 MG PO TABS
10.0000 mg | ORAL_TABLET | Freq: Every day | ORAL | Status: DC
  Administered 2019-05-31 – 2019-06-01 (×2): 10 mg via ORAL
  Filled 2019-05-31 (×2): qty 2

## 2019-05-31 MED ORDER — BREXPIPRAZOLE 1 MG PO TABS
1.0000 mg | ORAL_TABLET | Freq: Every day | ORAL | Status: DC
  Administered 2019-06-01: 02:00:00 1 mg via ORAL
  Filled 2019-05-31 (×2): qty 1

## 2019-05-31 MED ORDER — GABAPENTIN 100 MG PO CAPS: 100.0000 mg | ORAL_CAPSULE | Freq: Every morning | ORAL | Status: DC

## 2019-05-31 MED ORDER — PRIMIDONE 50 MG PO TABS
50.0000 mg | ORAL_TABLET | Freq: Two times a day (BID) | ORAL | Status: DC
  Administered 2019-05-31: 23:00:00 50 mg via ORAL
  Filled 2019-05-31 (×3): qty 1

## 2019-05-31 MED ORDER — FENOFIBRATE 54 MG PO TABS
54.0000 mg | ORAL_TABLET | Freq: Every day | ORAL | Status: DC
  Filled 2019-05-31: qty 1

## 2019-05-31 MED ORDER — ROSUVASTATIN CALCIUM 20 MG PO TABS
40.0000 mg | ORAL_TABLET | Freq: Every day | ORAL | Status: DC
  Administered 2019-05-31: 40 mg via ORAL
  Filled 2019-05-31 (×2): qty 2

## 2019-05-31 MED ORDER — LEVOTHYROXINE SODIUM 50 MCG PO TABS
125.0000 ug | ORAL_TABLET | Freq: Every day | ORAL | Status: DC
  Administered 2019-06-01: 12:00:00 125 ug via ORAL
  Filled 2019-05-31: qty 3

## 2019-05-31 MED ORDER — TRAMADOL HCL 50 MG PO TABS
50.0000 mg | ORAL_TABLET | Freq: Two times a day (BID) | ORAL | Status: DC | PRN
  Administered 2019-06-01: 50 mg via ORAL
  Filled 2019-05-31: qty 1

## 2019-05-31 NOTE — ED Provider Notes (Signed)
[facility] Emergency Department Provider Note       Time seen: ----------------------------------------- 8:31 PM on 05/31/2019 -----------------------------------------   I have reviewed the triage vital signs and the nursing notes.  HISTORY   Chief Complaint Fall    HPI Melanie King is a 74 y.o. female with a history of arthritis, CHF, depression, diabetes, hyperlipidemia, hypertension, osteoarthritis who presents to the ED for a fall.  Patient states she lives in a small 1 bedroom apartment with a friend.  She has had frequent falls, family is concerned she is not safe in her current environment.  Patient states she fell backwards after standing on something rigid which is a common occurrence for her.  She is complaining of left hip pain, also hit her head.  Past Medical History:  Diagnosis Date  . Arthritis   . CHF (congestive heart failure) (HCC)   . Coronary arteriosclerosis   . DDD (degenerative disc disease), cervical   . DDD (degenerative disc disease), lumbar   . Depression   . Diabetes mellitus without complication (HCC)   . GERD (gastroesophageal reflux disease)   . Hyperlipidemia   . Hypertension   . Osteoarthritis   . Pacemaker   . Tremor     Patient Active Problem List   Diagnosis Date Noted  . Frequent falls 05/26/2019  . Generalized weakness 05/26/2019  . AKI (acute kidney injury) (HCC) 03/21/2019  . Subdural hematoma (HCC) 03/20/2019  . Hypothyroidism 03/10/2019  . Depression   . Chronic diastolic CHF (congestive heart failure) (HCC)   . Acute metabolic encephalopathy   . Fall   . Tobacco abuse   . C6 cervical fracture (HCC)   . Acute delirium 05/12/2017  . Seizure (HCC) 05/10/2017  . Elevated sedimentation rate 04/04/2017  . Elevated C-reactive protein (CRP) 04/04/2017  . Chest pain, unspecified 04/02/2017  . Bilateral lower extremity pain (Secondary Area of Pain) (R>L) 04/02/2017  . Chronic hip pain, bilateral   (Secondary Area of Pain) (R>L) 04/02/2017  . Chronic bilateral low back pain with bilateral sciatica [facility] Area of Pain) (R>L) 04/02/2017  . Chronic pain syndrome 04/02/2017  . Disorder of bone, unspecified 04/02/2017  . Other long term (current) drug therapy 04/02/2017  . Other specified health status 04/02/2017  . Long term current use of opiate analgesic 04/02/2017  . Hip pain, bilateral 12/06/2016  . Tremor 05/27/2016  . Trigger finger of left hand 02/16/2016  . Osteoarthritis of both hands 02/16/2016  . Angina pectoris (HCC) 01/17/2016  . COPD (chronic obstructive pulmonary disease) (HCC) 01/17/2016  . CAD (coronary artery disease) 01/17/2016  . HLD (hyperlipidemia) 01/17/2016  . Hypertension 01/17/2016  . Sick sinus syndrome (HCC) 01/17/2016  . Chronic pain 01/17/2016  . Personal history of tobacco use, presenting hazards to health 11/16/2015  . Type II diabetes mellitus with renal manifestations (HCC) 05/12/2013    Past Surgical History:  Procedure Laterality Date  . ABDOMINAL HYSTERECTOMY    . BREAST BIOPSY Right 1994   neg cyst removed  . CHOLECYSTECTOMY    . EYE SURGERY  1964, 1966, and 1967   bilateral  . FOOT SURGERY Right    cellulitis  . PACEMAKER INSERTION    . SPINE SURGERY     cyst removed; [facility]    Allergies Alprazolam, Fentanyl, Meperidine, and Ranitidine hcl  Social History Social History   Tobacco Use  . Smoking status: Current Every Day Smoker    Packs/day: 1.00    Years: 50.00  Pack years: 50.00    Types: Cigarettes  . Smokeless tobacco: Never Used  Substance Use Topics  . Alcohol use: No  . Drug use: No    Review of Systems Constitutional: Negative for fever. Cardiovascular: Negative for chest pain. Respiratory: Negative for shortness of breath. Gastrointestinal: Negative for abdominal pain, vomiting and diarrhea. Musculoskeletal: Positive for left hip pain Skin: Negative for rash. Neurological: Negative for headaches,  focal weakness or numbness.  All systems negative/normal/unremarkable except as stated in the HPI  ____________________________________________   PHYSICAL EXAM:  VITAL SIGNS: ED Triage Vitals  Enc Vitals Group     BP 05/31/19 2014 116/77     Pulse Rate 05/31/19 2014 70     Resp 05/31/19 2014 (!) 22     Temp 05/31/19 2014 97.8 F (36.6 C)     Temp Source 05/31/19 2014 Oral     SpO2 05/31/19 2014 100 %     Weight 05/31/19 2015 152 lb 1.9 oz (69 kg)     Height 05/31/19 2015 5\' 1"  (1.549 m)     Head Circumference --      Peak Flow --      Pain Score 05/31/19 2014 5     Pain Loc --      Pain Edu? --      Excl. in [place]? --     Constitutional: Alert and oriented. Well appearing and in no distress. Eyes: Conjunctivae are normal. Normal extraocular movements. ENT      Head: Normocephalic and atraumatic.      Nose: No congestion/rhinnorhea.      Mouth/Throat: Mucous membranes are moist.      Neck: No stridor. Cardiovascular: Normal rate, regular rhythm. No murmurs, rubs, or gallops. Respiratory: Normal respiratory effort without tachypnea nor retractions. Breath sounds are clear and equal bilaterally. No wheezes/rales/rhonchi. Gastrointestinal: Soft and nontender. Normal bowel sounds Musculoskeletal: Nontender with normal range of motion in extremities. No lower extremity tenderness nor edema. Neurologic:  Normal speech and language. No gross focal neurologic deficits are appreciated.  Skin:  Skin is warm, dry and intact. No rash noted. Psychiatric: Mood and affect are normal. Speech and behavior are normal.  ____________________________________________  EKG: Interpreted by me. Sinus rhythm with rate of 70 bpm, left anterior fascicular block, right bundle branch block  ____________________________________________  ED COURSE:  As part of my medical decision making, I reviewed the following data within the [place] History obtained from family if available,  nursing notes, old chart and ekg, as well as notes from prior ED visits. Patient presented for a fall, we will assess with labs and imaging as indicated at this time.   Procedures  [place] F Dani was evaluated in Emergency Department on 05/31/2019 for the symptoms described in the history of present illness. She was evaluated in the context of the global COVID-19 pandemic, which necessitated consideration that the patient might be at risk for infection with the SARS-CoV-2 virus that causes COVID-19. Institutional protocols and algorithms that pertain to the evaluation of patients at risk for COVID-19 are in a state of rapid change based on information released by regulatory bodies including the CDC and federal and state organizations. These policies and algorithms were followed during the patient's care in the ED.  ____________________________________________   LABS (pertinent positives/negatives)  Labs Reviewed  CBC WITH DIFFERENTIAL/PLATELET - Abnormal; Notable for the following components:      Result Value   RBC 3.46 (*)    Hemoglobin 10.9 (*)  HCT 32.5 (*)    All other components within normal limits  COMPREHENSIVE METABOLIC PANEL - Abnormal; Notable for the following components:   CO2 20 (*)    Glucose, Bld 193 (*)    BUN 27 (*)    Creatinine, Ser 1.79 (*)    GFR calc non Af Amer 28 (*)    GFR calc Af Amer 32 (*)    All other components within normal limits  RESPIRATORY PANEL BY RT PCR (FLU A&B, COVID)  CBC WITH DIFFERENTIAL/PLATELET  URINALYSIS, COMPLETE (UACMP) WITH MICROSCOPIC  TROPONIN I (HIGH SENSITIVITY)  TROPONIN I (HIGH SENSITIVITY)    RADIOLOGY Images were viewed by me  CT head, pelvis x-ray IMPRESSION: No acute intracranial abnormality.  Findings consistent with age related atrophy and chronic small vessel ischemia IMPRESSION: Negative for fracture of the pelvis or left hip.  ____________________________________________   DIFFERENTIAL DIAGNOSIS    General debility, dehydration, electrolyte abnormality, occult infection, fracture, subdural  FINAL ASSESSMENT AND PLAN  Weakness, frequent falls   Plan: The patient had presented for weakness and frequent falls with the latest fall being this evening.  Family is trying to find nursing home placement for the patient.  Patient's labs did reveal some worsening in her BUN and creatinine but this appears to be coming and going.  She was given some IV fluids. Patient's imaging did not reveal any acute process.  We have consulted social work and physical therapy for nursing home placement.   Ulice Dash, MD    Note: This note was generated in part or whole with voice recognition software. Voice recognition is usually quite accurate but there are transcription errors that can and very often do occur. I apologize for any typographical errors that were not detected and corrected.     Emily Filbert, MD 05/31/19 2205

## 2019-05-31 NOTE — ED Notes (Signed)
Pt taken to xray 

## 2019-05-31 NOTE — ED Triage Notes (Addendum)
Pt here from home via [facility] after falling. Pt states that she fell backwards after standing on something "rigid", this is a common occurrence. EMS states that pt's family are trying to find pt in a nursing facility d/t issue. Pt was just d/c 3/24.   Pt complaining of left sided hip pain that has "been off and on for years", and new right sided hip and ankle pain. Pt also has small abrasion on right side of back. Does not take blood thinners. Denies hitting head.  Pt is A&Ox4.

## 2019-05-31 NOTE — ED Notes (Signed)
ED Provider at bedside. 

## 2019-06-01 DIAGNOSIS — R531 Weakness: Secondary | ICD-10-CM | POA: Diagnosis not present

## 2019-06-01 LAB — GLUCOSE, CAPILLARY: Glucose-Capillary: 186 mg/dL — ABNORMAL HIGH (ref 70–99)

## 2019-06-01 MED ORDER — FAMOTIDINE 20 MG PO TABS
20.0000 mg | ORAL_TABLET | Freq: Every day | ORAL | Status: DC
  Administered 2019-06-01: 20 mg via ORAL
  Filled 2019-06-01: qty 1

## 2019-06-01 NOTE — Evaluation (Signed)
Physical Therapy Evaluation Patient Details Name: Melanie King MRN: [id number] DOB: 12-27-1945 Today's Date: 06/01/2019   History of Present Illness  presented to ER secondary to fall in home environment, reports of L hip pain and bumping head in fall.  Imaging negative for acute injury.  Clinical Impression  Upon evaluation, patient alert and oriented; follows commands and demonstrates good effort with all mobility tasks.  Bilat UE/LE strength and ROM grossly symmetrical and WFL; no focal weakness appreciated.  Does endorse pain/soreness in mid-thoracic area; linear abrasion noted over R thoracic/rib area.  Currently requiring min assist for bed mobility; cga/close sup for sit/stand, basic transfers and gait (140') with RW, cga/close sup.  Demonstrates forward flexed posture with reciprocal stepping pattern, decreased step height/length, slow and guarded but no overt buckling or LOB; increased time/effort for walker management around stationary obstacles.  Do recommend continued use of RW at all times upon discharge; patient/family aware. Vitals stable and WFL throughout; no orthostasis noted, no symptoms reported. Would benefit from skilled PT to address above deficits and promote optimal return to PLOF.; Recommend transition to HHPT upon discharge from acute hospitalization.  May consider transition to ALF if patient care needs unable to be managed long-term in home environment.     Follow Up Recommendations Home health PT;Supervision/Assistance - 24 hour    Equipment Recommendations  Rolling walker with 5" wheels    Recommendations for Other Services       Precautions / Restrictions Precautions Precautions: Fall Restrictions Weight Bearing Restrictions: No      Mobility  Bed Mobility Overal bed mobility: Needs Assistance Bed Mobility: Supine to Sit;Sit to Supine     Supine to sit: Min assist Sit to supine: Min assist   General bed mobility comments: educated in log  rolling for position/protection of back  Transfers Overall transfer level: Needs assistance Equipment used: Rolling walker (2 wheeled) Transfers: Sit to/from Stand Sit to Stand: Min guard;Supervision         General transfer comment: cuing for hand placement  Ambulation/Gait Ambulation/Gait assistance: Min Gaffer (Feet): 140 Feet Assistive device: Rolling walker (2 wheeled)       General Gait Details: forward flexed posture with reciprocal stepping pattern, decreased step height/length, slow and guarded but no overt buckling or LOB; increased time/effort for walker management around stationary obstacles  Stairs            Wheelchair Mobility    Modified Rankin (Stroke Patients Only)       Balance Overall balance assessment: Needs assistance Sitting-balance support: No upper extremity supported;Feet supported Sitting balance-Leahy Scale: Good     Standing balance support: Bilateral upper extremity supported Standing balance-Leahy Scale: Fair                               Pertinent Vitals/Pain Pain Assessment: Faces Faces Pain Scale: Hurts little more Pain Location: back Pain Descriptors / Indicators: Aching;Guarding;Grimacing Pain Intervention(s): Limited activity within patient's tolerance;Monitored during session;Repositioned    Home Living Family/patient expects to be discharged to:: Private residence Living Arrangements: Non-relatives/Friends Available Help at Discharge: Friend(s);Available PRN/intermittently Type of Home: Apartment Home Access: Stairs to enter Entrance Stairs-Rails: None Entrance Stairs-Number of Steps: 1 Home Layout: One level Home Equipment: Walker - 4 wheels;Cane - single point Additional Comments: Pt reports she lives with her friend [name], however she is looking into moving into an ALF for increased assist/supervision.    Prior Function Level  of Independence: Independent with assistive  device(s)         Comments: Pt reports she uses rollater for all mobility. Reports multiple falls, at least 6 in previous six months.     Hand Dominance        Extremity/Trunk Assessment   Upper Extremity Assessment Upper Extremity Assessment: Overall WFL for tasks assessed(grossly 4+/5 throughout)    Lower Extremity Assessment Lower Extremity Assessment: Overall WFL for tasks assessed(grossly 4+/5 throughout)       Communication   Communication: HOH  Cognition Arousal/Alertness: Awake/alert Behavior During Therapy: WFL for tasks assessed/performed Overall Cognitive Status: Within Functional Limits for tasks assessed                                        General Comments      Exercises     Assessment/Plan    PT Assessment Patient needs continued PT services  PT Problem List Decreased strength;Decreased activity tolerance;Decreased balance;Decreased mobility;Decreased safety awareness       PT Treatment Interventions Gait training;DME instruction;Therapeutic exercise;Balance training;Functional mobility training;Therapeutic activities;Patient/family education    PT Goals (Current goals can be found in the Care Plan section)  Acute Rehab PT Goals Patient Stated Goal: to consider going to ALF PT Goal Formulation: With patient/family Time For Goal Achievement: 06/15/19 Potential to Achieve Goals: Good    Frequency Min 2X/week   Barriers to discharge        Co-evaluation               AM-PAC PT "6 Clicks" Mobility  Outcome Measure Help needed turning from your back to your side while in a flat bed without using bedrails?: None Help needed moving from lying on your back to sitting on the side of a flat bed without using bedrails?: None Help needed moving to and from a bed to a chair (including a wheelchair)?: A Little Help needed standing up from a chair using your arms (e.g., wheelchair or bedside chair)?: A Little Help needed to  walk in hospital room?: A Little Help needed climbing 3-5 steps with a railing? : A Little 6 Click Score: 20    End of Session Equipment Utilized During Treatment: Gait belt Activity Tolerance: Patient tolerated treatment well Patient left: in bed;with call bell/phone within reach;with family/visitor present Nurse Communication: Mobility status PT Visit Diagnosis: Muscle weakness (generalized) (M62.81);Difficulty in walking, not elsewhere classified (R26.2);Repeated falls (R29.6)    Time: 5462-7035 PT Time Calculation (min) (ACUTE ONLY): 24 min   Charges:   PT Evaluation $PT Eval Moderate Complexity: 1 Mod PT Treatments $Therapeutic Activity: 8-22 mins       Ethan Kasperski H. Manson Passey, PT, DPT, NCS 06/01/19, 11:34 AM [phone]

## 2019-06-01 NOTE — Social Work (Addendum)
TOC CM/SW consult received for SNF review.  SW assessment in progress.   Pt active with [facility].  Waiting for call back - resume services PT/OT/Nurse/Aid.   Plan: Discharge home w/HH.   Larwance Rote, MSW, LCSW  [phone] 8am-6pm (weekends)

## 2019-06-01 NOTE — ED Notes (Signed)
Pt given fresh linen and bathed with warm, premoistened wipes per request.  TV turned on and patient resting comfortably, will continue to monitor.reassess

## 2019-06-01 NOTE — Progress Notes (Signed)
PHARMACY NOTE:  RENAL DOSAGE ADJUSTMENT  Current orders for famotidine 40mg  PO daily in patient with CrCl < 1ml/min  Estimated Creatinine Clearance: 24.9 mL/min (A) (by C-G formula based on SCr of 1.79 mg/dL (H)).  Will adjust orders to famotidine 20mg  PO daily per protocol  31m, PharmD, BCPS Clinical Pharmacist

## 2019-06-01 NOTE — ED Provider Notes (Signed)
-----------------------------------------   2:23 PM on 06/01/2019 -----------------------------------------  Patient has been evaluated by physical therapy and social work, deemed appropriate for discharge home with home health.  Home health has been arranged by social work and patient agrees with plan for discharge home.   Chesley Noon, MD 06/01/19 [phone]

## 2019-06-01 NOTE — ED Notes (Signed)
[phone]   Melanie King (roommate)

## 2019-06-01 NOTE — ED Notes (Signed)
PT/OT with patient.  Pt ambulating around room with gait belt and walker.

## 2019-06-01 NOTE — TOC Initial Note (Signed)
Transition of Care [facility]) - Initial/Assessment Note    Patient Details  Name: Melanie King MRN: [id number] Date of Birth: [birth year]  Transition of Care [facility] CM/SW Contact:    Larwance Rote, LCSW Phone Number: 06/01/2019, 1:12 PM  Clinical Narrative: Patient is a 74 year old female who presented to [facility] after a fall (backwards) in her home. SW received consult for SNF.  [facility] physical therapist recommendation for home health (PT/OT/Nurse/Aid)   Records indicate that the patient is active with [facility].   Plan: Home with home health.  Resume home health services with [facility].   Expected Discharge Plan: Home w Home Health Services Barriers to Discharge: Barriers Resolved(Waiting for confirmation from [facility])   Patient Goals and CMS Choice     Choice offered to / list presented to : Patient  Expected Discharge Plan and Services Expected Discharge Plan: Home w Home Health Services In-house Referral: Clinical Social Work   Post Acute Care Choice: Home Health(Patient active with [facility], [phone]) Living arrangements for the past 2 months: Apartment(Pt has a roomate)                   Prior Living Arrangements/Services Living arrangements for the past 2 months: Apartment(Pt has a Restaurant manager, fast food)   Patient language and need for interpreter reviewed:: No Do you feel safe going back to the place where you live?: Yes      Need for Family Participation in Patient Care: No (Comment) Care giver support system in place?: Yes (comment) Current home services: Home PT, Home OT, Homehealth aide, Home RT([facility]) Criminal Activity/Legal Involvement Pertinent to Current Situation/Hospitalization: No - Comment as needed  Activities of Daily Living      Permission Sought/Granted Permission sought to share information with : Case Manager, Magazine features editor, Family Supports Permission granted to share information with :  Yes, Verbal Permission Granted  Share Information with NAME: Bary Leriche [phone]           Emotional Assessment Appearance:: Appears older than stated age Attitude/Demeanor/Rapport: Other (comment)(calm) Affect (typically observed): Accepting Orientation: : Oriented to Self, Oriented to Place, Oriented to  Time, Oriented to Situation Alcohol / Substance Use: Not Applicable Psych Involvement: No (comment)  Admission diagnosis:  [facility] - Fall Patient Active Problem List   Diagnosis Date Noted  . Frequent falls 05/26/2019  . Generalized weakness 05/26/2019  . AKI (acute kidney injury) (HCC) 03/21/2019  . Subdural hematoma (HCC) 03/20/2019  . Hypothyroidism 03/10/2019  . Depression   . Chronic diastolic CHF (congestive heart failure) (HCC)   . Acute metabolic encephalopathy   . Fall   . Tobacco abuse   . C6 cervical fracture (HCC)   . Acute delirium 05/12/2017  . Seizure (HCC) 05/10/2017  . Elevated sedimentation rate 04/04/2017  . Elevated C-reactive protein (CRP) 04/04/2017  . Chest pain, unspecified 04/02/2017  . Bilateral lower extremity pain (Secondary Area of Pain) (R>L) 04/02/2017  . Chronic hip pain, bilateral  (Secondary Area of Pain) (R>L) 04/02/2017  . Chronic bilateral low back pain with bilateral sciatica [facility] Area of Pain) (R>L) 04/02/2017  . Chronic pain syndrome 04/02/2017  . Disorder of bone, unspecified 04/02/2017  . Other long term (current) drug therapy 04/02/2017  . Other specified health status 04/02/2017  . Long term current use of opiate analgesic 04/02/2017  . Hip pain, bilateral 12/06/2016  . Tremor 05/27/2016  . Trigger finger of left hand 02/16/2016  . Osteoarthritis  of both hands 02/16/2016  . Angina pectoris ([place]) 01/17/2016  . COPD (chronic obstructive pulmonary disease) ([place]) 01/17/2016  . CAD (coronary artery disease) 01/17/2016  . HLD (hyperlipidemia) 01/17/2016  . Hypertension 01/17/2016  . Sick sinus syndrome ([place])  01/17/2016  . Chronic pain 01/17/2016  . Personal history of tobacco use, presenting hazards to health 11/16/2015  . Type II diabetes mellitus with renal manifestations ([place]) 05/12/2013   PCP:  Idelle Crouch, MD Pharmacy:   [facility] [place], [place] - [address] [place] [address] Phone: [phone] Fax: [phone]  [id number] - [place], [address] [address] Phone: [phone] Fax: [place], [place] [address] [place] [address] Phone: [phone] Fax: [phone]     Social Determinants of Health (SDOH) Interventions    Readmission Risk Interventions No flowsheet data found.

## 2019-06-01 NOTE — ED Notes (Signed)
Pt resting in position of comfort, no acute s/s of distress noted at present.  arousable to verbal stimuli, upon arousal GCS 14 with confused speech, rr even/unlabored, +PMSC x4, skin warm/pink/dry, VSS will continue to monitor/reassess

## 2019-06-12 ENCOUNTER — Encounter: Payer: Self-pay | Admitting: Emergency Medicine

## 2019-06-12 ENCOUNTER — Emergency Department
Admission: EM | Admit: 2019-06-12 | Discharge: 2019-06-12 | Disposition: A | Discharge status: Home / Self Care | Payer: Medicare Other | Attending: Emergency Medicine | Admitting: Emergency Medicine

## 2019-06-12 ENCOUNTER — Other Ambulatory Visit: Payer: Self-pay

## 2019-06-12 ENCOUNTER — Emergency Department: Payer: Medicare Other

## 2019-06-12 DIAGNOSIS — Z79899 Other long term (current) drug therapy: Secondary | ICD-10-CM | POA: Insufficient documentation

## 2019-06-12 DIAGNOSIS — E119 Type 2 diabetes mellitus without complications: Secondary | ICD-10-CM | POA: Insufficient documentation

## 2019-06-12 DIAGNOSIS — J069 Acute upper respiratory infection, unspecified: Secondary | ICD-10-CM | POA: Insufficient documentation

## 2019-06-12 DIAGNOSIS — I110 Hypertensive heart disease with heart failure: Secondary | ICD-10-CM | POA: Insufficient documentation

## 2019-06-12 DIAGNOSIS — Z7982 Long term (current) use of aspirin: Secondary | ICD-10-CM | POA: Diagnosis not present

## 2019-06-12 DIAGNOSIS — F17210 Nicotine dependence, cigarettes, uncomplicated: Secondary | ICD-10-CM | POA: Insufficient documentation

## 2019-06-12 DIAGNOSIS — R531 Weakness: Secondary | ICD-10-CM | POA: Insufficient documentation

## 2019-06-12 DIAGNOSIS — Z20822 Contact with and (suspected) exposure to covid-19: Secondary | ICD-10-CM | POA: Diagnosis not present

## 2019-06-12 DIAGNOSIS — I5032 Chronic diastolic (congestive) heart failure: Secondary | ICD-10-CM | POA: Insufficient documentation

## 2019-06-12 DIAGNOSIS — B349 Viral infection, unspecified: Secondary | ICD-10-CM

## 2019-06-12 DIAGNOSIS — Z794 Long term (current) use of insulin: Secondary | ICD-10-CM | POA: Insufficient documentation

## 2019-06-12 DIAGNOSIS — I2510 Atherosclerotic heart disease of native coronary artery without angina pectoris: Secondary | ICD-10-CM | POA: Insufficient documentation

## 2019-06-12 DIAGNOSIS — R519 Headache, unspecified: Secondary | ICD-10-CM | POA: Diagnosis present

## 2019-06-12 LAB — COMPREHENSIVE METABOLIC PANEL
ALT: 20 U/L (ref 0–44)
AST: 20 U/L (ref 15–41)
Albumin: 4 g/dL (ref 3.5–5.0)
Alkaline Phosphatase: 75 U/L (ref 38–126)
Anion gap: 9 (ref 5–15)
BUN: 18 mg/dL (ref 8–23)
CO2: 24 mmol/L (ref 22–32)
Calcium: 9.3 mg/dL (ref 8.9–10.3)
Chloride: 105 mmol/L (ref 98–111)
Creatinine, Ser: 1.29 mg/dL — ABNORMAL HIGH (ref 0.44–1.00)
GFR calc Af Amer: 48 mL/min — ABNORMAL LOW (ref >60)
GFR calc non Af Amer: 41 mL/min — ABNORMAL LOW (ref >60)
Glucose, Bld: 164 mg/dL — ABNORMAL HIGH (ref 70–99)
Potassium: 4.4 mmol/L (ref 3.5–5.1)
Sodium: 138 mmol/L (ref 135–145)
Total Bilirubin: 0.5 mg/dL (ref 0.3–1.2)
Total Protein: 8.1 g/dL (ref 6.5–8.1)

## 2019-06-12 LAB — CBC WITH DIFFERENTIAL/PLATELET
Abs Immature Granulocytes: 0.04 10*3/uL (ref 0.00–0.07)
Basophils Absolute: 0.1 10*3/uL (ref 0.0–0.1)
Basophils Relative: 1 %
Eosinophils Absolute: 0 10*3/uL (ref 0.0–0.5)
Eosinophils Relative: 0 %
HCT: 35.7 % — ABNORMAL LOW (ref 36.0–46.0)
Hemoglobin: 11.7 g/dL — ABNORMAL LOW (ref 12.0–15.0)
Immature Granulocytes: 0 %
Lymphocytes Relative: 18 %
Lymphs Abs: 1.7 10*3/uL (ref 0.7–4.0)
MCH: 31.2 pg (ref 26.0–34.0)
MCHC: 32.8 g/dL (ref 30.0–36.0)
MCV: 95.2 fL (ref 80.0–100.0)
Monocytes Absolute: 0.7 10*3/uL (ref 0.1–1.0)
Monocytes Relative: 7 %
Neutro Abs: 7 10*3/uL (ref 1.7–7.7)
Neutrophils Relative %: 74 %
Platelets: 345 10*3/uL (ref 150–400)
RBC: 3.75 MIL/uL — ABNORMAL LOW (ref 3.87–5.11)
RDW: 14.6 % (ref 11.5–15.5)
WBC: 9.5 10*3/uL (ref 4.0–10.5)
nRBC: 0 % (ref 0.0–0.2)

## 2019-06-12 LAB — URINALYSIS, COMPLETE (UACMP) WITH MICROSCOPIC
Bacteria, UA: NONE SEEN
Bilirubin Urine: NEGATIVE
Glucose, UA: NEGATIVE mg/dL
Hgb urine dipstick: NEGATIVE
Ketones, ur: NEGATIVE mg/dL
Leukocytes,Ua: NEGATIVE
Nitrite: NEGATIVE
Protein, ur: 30 mg/dL — AB
Specific Gravity, Urine: 1.006 (ref 1.005–1.030)
Squamous Epithelial / HPF: NONE SEEN (ref 0–5)
WBC, UA: NONE SEEN WBC/hpf (ref 0–5)
pH: 6 (ref 5.0–8.0)

## 2019-06-12 LAB — INFLUENZA PANEL BY PCR (TYPE A & B)
Influenza A By PCR: NEGATIVE
Influenza B By PCR: NEGATIVE

## 2019-06-12 LAB — TROPONIN I (HIGH SENSITIVITY): Troponin I (High Sensitivity): 13 ng/L (ref <18)

## 2019-06-12 LAB — SARS CORONAVIRUS 2 (TAT 6-24 HRS): SARS Coronavirus 2: NEGATIVE

## 2019-06-12 MED ORDER — ACETAMINOPHEN 500 MG PO TABS
1000.0000 mg | ORAL_TABLET | Freq: Once | ORAL | Status: AC
  Administered 2019-06-12: 1000 mg via ORAL
  Filled 2019-06-12: qty 2

## 2019-06-12 NOTE — ED Provider Notes (Signed)
[facility] Emergency Department Provider Note  ____________________________________________   First MD Initiated Contact with Patient 06/12/19 1221     (approximate)  I have reviewed the triage vital signs and the nursing notes.   HISTORY  Chief Complaint Headache and Weakness    HPI Melanie King is a 74 y.o. female with CHF, diabetes, hypertension, hyperlipidemia who comes in for weakness.  Patient states that she has been feeling weak for the past 3 days.  She states she feels like she has a flu.  She states that she has had a headache that feels like a sinus headache but also states that the pain is also in the back of her head.  The pain is constant, took Tylenol last yesterday, nothing makes it worse.  Also has been taking Flonase and allergy pills.  She also reports some body aches and generalized weakness.  She denies having coronavirus before.  Has not been vaccinated.  Does endorse a little bit of shortness of breath, no abdominal pain, no dysuria, no leg swelling.   On review of records patient was admitted in March 22nd but to be secondary to altered mental status from her medications.  At that time patient also was noted to have an elevated TSH in the increased her levothyroxine seen and recommended a repeat check in 4 to 6 weeks.  Patient was then seen on 3/27 for weakness.  Patient was evaluated by physical therapy and social work and deemed appropriate for discharge home with home health.          Past Medical History:  Diagnosis Date  . Arthritis   . CHF (congestive heart failure) (HCC)   . Coronary arteriosclerosis   . DDD (degenerative disc disease), cervical   . DDD (degenerative disc disease), lumbar   . Depression   . Diabetes mellitus without complication (HCC)   . GERD (gastroesophageal reflux disease)   . Hyperlipidemia   . Hypertension   . Osteoarthritis   . Pacemaker   . Tremor     Patient Active Problem List    Diagnosis Date Noted  . Frequent falls 05/26/2019  . Generalized weakness 05/26/2019  . AKI (acute kidney injury) (HCC) 03/21/2019  . Subdural hematoma (HCC) 03/20/2019  . Hypothyroidism 03/10/2019  . Depression   . Chronic diastolic CHF (congestive heart failure) (HCC)   . Acute metabolic encephalopathy   . Fall   . Tobacco abuse   . C6 cervical fracture (HCC)   . Acute delirium 05/12/2017  . Seizure (HCC) 05/10/2017  . Elevated sedimentation rate 04/04/2017  . Elevated C-reactive protein (CRP) 04/04/2017  . Chest pain, unspecified 04/02/2017  . Bilateral lower extremity pain (Secondary Area of Pain) (R>L) 04/02/2017  . Chronic hip pain, bilateral  (Secondary Area of Pain) (R>L) 04/02/2017  . Chronic bilateral low back pain with bilateral sciatica [facility] Area of Pain) (R>L) 04/02/2017  . Chronic pain syndrome 04/02/2017  . Disorder of bone, unspecified 04/02/2017  . Other long term (current) drug therapy 04/02/2017  . Other specified health status 04/02/2017  . Long term current use of opiate analgesic 04/02/2017  . Hip pain, bilateral 12/06/2016  . Tremor 05/27/2016  . Trigger finger of left hand 02/16/2016  . Osteoarthritis of both hands 02/16/2016  . Angina pectoris (HCC) 01/17/2016  . COPD (chronic obstructive pulmonary disease) (HCC) 01/17/2016  . CAD (coronary artery disease) 01/17/2016  . HLD (hyperlipidemia) 01/17/2016  . Hypertension 01/17/2016  . Sick sinus syndrome (HCC) 01/17/2016  .  Chronic pain 01/17/2016  . Personal history of tobacco use, presenting hazards to health 11/16/2015  . Type II diabetes mellitus with renal manifestations ([place]) 05/12/2013    Past Surgical History:  Procedure Laterality Date  . ABDOMINAL HYSTERECTOMY    . BREAST BIOPSY Right 1994   neg cyst removed  . CHOLECYSTECTOMY    . EYE SURGERY  1964, 1966, and 1967   bilateral  . FOOT SURGERY Right    cellulitis  . PACEMAKER INSERTION    . SPINE SURGERY     cyst removed; [facility]    Prior to Admission medications   Medication Sig Start Date End Date Taking? Authorizing Provider  amLODipine (NORVASC) 10 MG tablet TAKE 1 TABLET BY MOUTH EVERY DAY Patient taking differently: Take 10 mg by mouth daily.  08/28/16   Kathrine Haddock, NP  aspirin EC 81 MG tablet Take 81 mg by mouth daily.    [provider]  benztropine (COGENTIN) 0.5 MG tablet Take 0.5 mg by mouth 2 (two) times daily.    [provider]  brexpiprazole (REXULTI) 1 MG TABS tablet Take 1 mg by mouth at bedtime.    [provider]  calcitRIOL (ROCALTROL) 0.25 MCG capsule Take 0.25 mcg by mouth daily.    [provider]  cholecalciferol (VITAMIN D3) 25 MCG (1000 UNIT) tablet Take 1,000 Units by mouth daily.    [provider]  citalopram (CELEXA) 20 MG tablet Take 20 mg by mouth daily.    [provider]  famotidine (PEPCID) 40 MG tablet Take 40 mg by mouth daily.    [provider]  fenofibrate (TRICOR) 48 MG tablet Take 48 mg by mouth daily.    [provider]  furosemide (LASIX) 20 MG tablet Take 20 mg by mouth daily.    [provider]  gabapentin (NEURONTIN) 100 MG capsule Take 100-300 mg by mouth See admin instructions. Take 1 capsule (100mg ) by mouth every morning and daily at noon and take 3 capsules (300mg ) by mouth every night    [provider]  LANTUS SOLOSTAR 100 UNIT/ML Solostar Pen Inject 24 Units into the skin at bedtime.  03/19/19   [provider]  levothyroxine (SYNTHROID) 125 MCG tablet Take 1 tablet (125 mcg total) by mouth daily before breakfast. 05/28/19   Dhungel, Nishant, MD  losartan (COZAAR) 50 MG tablet Take 50 mg by mouth daily.    [provider]  meloxicam (MOBIC) 15 MG tablet Take 15 mg by mouth daily. 05/30/19   [provider]  metFORMIN (GLUCOPHAGE) 1000 MG tablet Take 0.5 tablets (500 mg total) by mouth 2 (two) times daily with a meal. 05/28/19   Dhungel,  Nishant, MD  metoprolol tartrate (LOPRESSOR) 25 MG tablet Take 25 mg by mouth 2 (two) times daily.    [provider]  montelukast (SINGULAIR) 10 MG tablet Take 10 mg by mouth at bedtime.    [provider]  nicotine (NICODERM CQ - DOSED IN MG/24 HOURS) 21 mg/24hr patch Place 1 patch (21 mg total) onto the skin daily. 03/13/19   Dhungel, Flonnie Overman, MD  nitroGLYCERIN (NITRODUR - DOSED IN MG/24 HR) 0.2 mg/hr patch Place 0.2 mg onto the skin daily. leave patch on 12-14 hours, then remove for 10-12 hours prior to applying the next patch    [provider]  primidone (MYSOLINE) 50 MG tablet Take 50 mg by mouth 2 (two) times daily.    [provider]  rosuvastatin (CRESTOR) 40 MG  tablet Take 40 mg by mouth at bedtime.    [provider]  sitaGLIPtin (JANUVIA) 50 MG tablet Take 50 mg by mouth daily.    [provider]  traMADol (ULTRAM) 50 MG tablet Take 1 tablet (50 mg total) by mouth every 12 (twelve) hours as needed for moderate pain. 05/28/19   Dhungel, Theda Belfast, MD  vitamin B-12 (CYANOCOBALAMIN) 1000 MCG tablet Take 1,000 mcg by mouth daily.    [provider]    Allergies Alprazolam, Fentanyl, Meperidine, and Ranitidine hcl  Family History  Problem Relation Age of Onset  . Breast cancer Maternal Aunt 40  . Cancer Mother        colon  . Aneurysm Father     Social History Social History   Tobacco Use  . Smoking status: Current Every Day Smoker    Packs/day: 1.00    Years: 50.00    Pack years: 50.00    Types: Cigarettes  . Smokeless tobacco: Never Used  Substance Use Topics  . Alcohol use: No  . Drug use: No      Review of Systems Constitutional: Positive chills, generalized weakness Eyes: No visual changes. ENT: No sore throat. Cardiovascular: Denies chest pain. Respiratory: Mild shortness of breath Gastrointestinal: No abdominal pain.  No nausea, no vomiting.  No diarrhea.  No constipation. Genitourinary: Negative  for dysuria. Musculoskeletal: Negative for back pain. Skin: Negative for rash. Neurological: Positive headache, no focal weakness or numbness. All other ROS negative ____________________________________________   PHYSICAL EXAM:  VITAL SIGNS: ED Triage Vitals  Enc Vitals Group     BP 06/12/19 1017 (!) 146/58     Pulse Rate 06/12/19 1017 70     Resp 06/12/19 1017 18     Temp 06/12/19 1017 98.4 F (36.9 C)     Temp Source 06/12/19 1017 Oral     SpO2 06/12/19 1017 98 %     Weight 06/12/19 1014 152 lb 1.9 oz (69 kg)     Height 06/12/19 1014 5\' 1"  (1.549 m)     Head Circumference --      Peak Flow --      Pain Score 06/12/19 1014 9     Pain Loc --      Pain Edu? --      Excl. in GC? --     Constitutional: Alert and oriented. Well appearing and in no acute distress. Eyes: Conjunctivae are normal. EOMI. Head: Atraumatic.  Tenderness on her sinuses. Nose: No congestion/rhinnorhea. Mouth/Throat: Mucous membranes are moist.   Neck: No stridor. Trachea Midline. FROM Cardiovascular: Normal rate, regular rhythm. Grossly normal heart sounds.  Good peripheral circulation. Respiratory: Normal respiratory effort.  No retractions. Lungs CTAB. Gastrointestinal: Soft and nontender. No distention. No abdominal bruits.  Musculoskeletal: No lower extremity tenderness nor edema.  No joint effusions. Neurologic:  Normal speech and language. No gross focal neurologic deficits are appreciated.  Skin:  Skin is warm, dry and intact. No rash noted. Psychiatric: Mood and affect are normal. Speech and behavior are normal. GU: Deferred   ____________________________________________   LABS (all labs ordered are listed, but only abnormal results are displayed)  Labs Reviewed  CBC WITH DIFFERENTIAL/PLATELET - Abnormal; Notable for the following components:      Result Value   RBC 3.75 (*)    Hemoglobin 11.7 (*)    HCT 35.7 (*)    All other components within normal limits  COMPREHENSIVE METABOLIC  PANEL - Abnormal; Notable for the following components:   Glucose,  Bld 164 (*)    Creatinine, Ser 1.29 (*)    GFR calc non Af Amer 41 (*)    GFR calc Af Amer 48 (*)    All other components within normal limits  URINALYSIS, COMPLETE (UACMP) WITH MICROSCOPIC - Abnormal; Notable for the following components:   Color, Urine YELLOW (*)    APPearance CLEAR (*)    Protein, ur 30 (*)    All other components within normal limits  SARS CORONAVIRUS 2 (TAT 6-24 HRS)  INFLUENZA PANEL BY PCR (TYPE A & B)  TROPONIN I (HIGH SENSITIVITY)  TROPONIN I (HIGH SENSITIVITY)   ____________________________________________   ED ECG REPORT I, Concha Se, the attending physician, personally viewed and interpreted this ECG.  EKG is atrially paced at a rate of 71, no ST elevations, T wave inversions in 1 and aVL, normal intervals.  T wave inversions look similar to prior. ____________________________________________  RADIOLOGY Vela Prose, personally viewed and evaluated these images (plain radiographs) as part of my medical decision making, as well as reviewing the written report by the radiologist.  ED MD interpretation: No pneumonia  Official radiology report(s): DG Chest 2 View  Result Date: 06/12/2019 CLINICAL DATA:  Cough EXAM: CHEST - 2 VIEW COMPARISON:  May 26, 2019 FINDINGS: No edema or consolidation. Heart size and pulmonary vascularity are normal. No adenopathy. Pacemaker leads are attached to the right atrium and right ventricle. No pneumothorax. Postoperative changes noted in the right shoulder. There is acromioclavicular separation. There is slight anterior wedging of a lower thoracic vertebral body. IMPRESSION: No edema or airspace opacity. Stable cardiac silhouette. Postoperative change right shoulder with chronic acromioclavicular separation on the right. Electronically Signed   By: Bretta Bang III M.D.   On: 06/12/2019 10:49     ____________________________________________   PROCEDURES  Procedure(s) performed (including Critical Care):  Procedures   ____________________________________________   INITIAL IMPRESSION / ASSESSMENT AND PLAN / ED COURSE  Melanie F Mitchell was evaluated in Emergency Department on 06/12/2019 for the symptoms described in the history of present illness. She was evaluated in the context of the global COVID-19 pandemic, which necessitated consideration that the patient might be at risk for infection with the SARS-CoV-2 virus that causes COVID-19. Institutional protocols and algorithms that pertain to the evaluation of patients at risk for COVID-19 are in a state of rapid change based on information released by regulatory bodies including the CDC and federal and state organizations. These policies and algorithms were followed during the patient's care in the ED.    Patient is a well-appearing 74 year old who comes in for flulike symptoms.  Patient has had some falls and she is not exactly sure when the last fall was given the report of headache will get a CT head to make sure no evidence of intracranial hemorrhage.  Will get labs to evaluate for electrolyte abnormalities, AKI.  Chest x-ray to evaluate for pneumonia.  Will get flu swab although patient is already 72 hours in.  We will also get Covid swab.  No signs of myxedema coma or thyroid storm.  We will hold off on rechecking her thyroid levels given she was just increased on her thyroid medicine and is planning to have a check in another 3 or 4 weeks.  No risk factors for PE given her other symptoms that seems to be more viral in nature.  Hemoglobin is around patient's baseline. Kidney function is also around her baseline. Cardiac marker is 13 I have low suspicion  this is ACS given has been going ongoing for 3 days.  Patient was able to ambulate with the nurse to the bathroom with a normal oxygen level.  UA no evidence of  UTI  Reevaluated patient and she is doing well.  Vital signs remained stable.  Patient states that she feels ready to go home.  We discussed symptomatic management with Tylenol 1 g every 8 hours, Flonase and her allergy pills and return to the ER if she develops worsening shortness of breath, chest pain, not eating or any other concerns   I discussed the provisional nature of ED diagnosis, the treatment so far, the ongoing plan of care, follow up appointments and return precautions with the patient and any family or support people present. They expressed understanding and agreed with the plan, discharged home.   ____________________________________________   FINAL CLINICAL IMPRESSION(S) / ED DIAGNOSES   Final diagnoses:  Viral illness      MEDICATIONS GIVEN DURING THIS VISIT:  Medications  acetaminophen (TYLENOL) tablet 1,000 mg (1,000 mg Oral Given 06/12/19 1411)     ED Discharge Orders    None       Note:  This document was prepared using Dragon voice recognition software and may include unintentional dictation errors.   Concha Se, MD 06/12/19 1459

## 2019-06-12 NOTE — ED Notes (Signed)
Pt ambulated to bathroom with EDT [place] assistance. Pt's oxygen sat remained at 98%-100% on RA. Pt provided a UA sample . Pt placed back on bed.NAD noted at this time.

## 2019-06-12 NOTE — ED Triage Notes (Signed)
Pt states "I feel like I have the flu".  Here for headache, generalized weakness, and body aches.  currently alert and oriented. VSS. NAD. No fever known. Unlabored at this time. + chills.

## 2019-06-12 NOTE — ED Notes (Signed)
Pt transported to CT ?

## 2019-06-12 NOTE — ED Notes (Signed)
ED Provider Funk at bedside. 

## 2019-06-12 NOTE — Discharge Instructions (Addendum)
Your work-up and labs are reassuring.  CT imaging and x-ray were negative.  We are testing you for the flu and Covid.  You take Tylenol 1 g every 8 hours.  Continue take your Flonase and your allergy pills.

## 2019-07-15 ENCOUNTER — Inpatient Hospital Stay
Admission: EM | Admit: 2019-07-15 | Discharge: 2019-07-20 | DRG: 291 | Disposition: A | Discharge status: Skilled Nursing Facility | Payer: Medicare Other | Attending: Internal Medicine | Admitting: Internal Medicine

## 2019-07-15 ENCOUNTER — Inpatient Hospital Stay
Admit: 2019-07-15 | Discharge: 2019-07-15 | Disposition: A | Discharge status: Home / Self Care | Payer: Medicare Other | Attending: Family Medicine | Admitting: Family Medicine

## 2019-07-15 ENCOUNTER — Emergency Department: Payer: Medicare Other

## 2019-07-15 ENCOUNTER — Other Ambulatory Visit: Payer: Self-pay

## 2019-07-15 DIAGNOSIS — R0603 Acute respiratory distress: Secondary | ICD-10-CM | POA: Diagnosis not present

## 2019-07-15 DIAGNOSIS — M5030 Other cervical disc degeneration, unspecified cervical region: Secondary | ICD-10-CM | POA: Diagnosis present

## 2019-07-15 DIAGNOSIS — I509 Heart failure, unspecified: Secondary | ICD-10-CM

## 2019-07-15 DIAGNOSIS — E1142 Type 2 diabetes mellitus with diabetic polyneuropathy: Secondary | ICD-10-CM | POA: Diagnosis present

## 2019-07-15 DIAGNOSIS — M5136 Other intervertebral disc degeneration, lumbar region: Secondary | ICD-10-CM | POA: Diagnosis present

## 2019-07-15 DIAGNOSIS — I130 Hypertensive heart and chronic kidney disease with heart failure and stage 1 through stage 4 chronic kidney disease, or unspecified chronic kidney disease: Principal | ICD-10-CM | POA: Diagnosis present

## 2019-07-15 DIAGNOSIS — R1310 Dysphagia, unspecified: Secondary | ICD-10-CM | POA: Diagnosis present

## 2019-07-15 DIAGNOSIS — I350 Nonrheumatic aortic (valve) stenosis: Secondary | ICD-10-CM | POA: Diagnosis present

## 2019-07-15 DIAGNOSIS — Z888 Allergy status to other drugs, medicaments and biological substances status: Secondary | ICD-10-CM

## 2019-07-15 DIAGNOSIS — Z79899 Other long term (current) drug therapy: Secondary | ICD-10-CM

## 2019-07-15 DIAGNOSIS — R0902 Hypoxemia: Secondary | ICD-10-CM

## 2019-07-15 DIAGNOSIS — E039 Hypothyroidism, unspecified: Secondary | ICD-10-CM | POA: Diagnosis present

## 2019-07-15 DIAGNOSIS — E1165 Type 2 diabetes mellitus with hyperglycemia: Secondary | ICD-10-CM | POA: Diagnosis present

## 2019-07-15 DIAGNOSIS — R296 Repeated falls: Secondary | ICD-10-CM | POA: Diagnosis present

## 2019-07-15 DIAGNOSIS — I452 Bifascicular block: Secondary | ICD-10-CM | POA: Diagnosis present

## 2019-07-15 DIAGNOSIS — Z885 Allergy status to narcotic agent status: Secondary | ICD-10-CM

## 2019-07-15 DIAGNOSIS — Z791 Long term (current) use of non-steroidal anti-inflammatories (NSAID): Secondary | ICD-10-CM

## 2019-07-15 DIAGNOSIS — I5033 Acute on chronic diastolic (congestive) heart failure: Secondary | ICD-10-CM | POA: Diagnosis present

## 2019-07-15 DIAGNOSIS — I495 Sick sinus syndrome: Secondary | ICD-10-CM | POA: Diagnosis present

## 2019-07-15 DIAGNOSIS — Z7982 Long term (current) use of aspirin: Secondary | ICD-10-CM

## 2019-07-15 DIAGNOSIS — F329 Major depressive disorder, single episode, unspecified: Secondary | ICD-10-CM | POA: Diagnosis present

## 2019-07-15 DIAGNOSIS — J9600 Acute respiratory failure, unspecified whether with hypoxia or hypercapnia: Secondary | ICD-10-CM | POA: Diagnosis present

## 2019-07-15 DIAGNOSIS — J9601 Acute respiratory failure with hypoxia: Secondary | ICD-10-CM | POA: Diagnosis present

## 2019-07-15 DIAGNOSIS — E785 Hyperlipidemia, unspecified: Secondary | ICD-10-CM | POA: Diagnosis present

## 2019-07-15 DIAGNOSIS — I2510 Atherosclerotic heart disease of native coronary artery without angina pectoris: Secondary | ICD-10-CM | POA: Diagnosis present

## 2019-07-15 DIAGNOSIS — E119 Type 2 diabetes mellitus without complications: Secondary | ICD-10-CM | POA: Diagnosis not present

## 2019-07-15 DIAGNOSIS — Z794 Long term (current) use of insulin: Secondary | ICD-10-CM

## 2019-07-15 DIAGNOSIS — Z950 Presence of cardiac pacemaker: Secondary | ICD-10-CM | POA: Diagnosis not present

## 2019-07-15 DIAGNOSIS — J441 Chronic obstructive pulmonary disease with (acute) exacerbation: Secondary | ICD-10-CM | POA: Diagnosis present

## 2019-07-15 DIAGNOSIS — F17210 Nicotine dependence, cigarettes, uncomplicated: Secondary | ICD-10-CM | POA: Diagnosis present

## 2019-07-15 DIAGNOSIS — U071 COVID-19: Secondary | ICD-10-CM | POA: Diagnosis present

## 2019-07-15 DIAGNOSIS — Z20822 Contact with and (suspected) exposure to covid-19: Secondary | ICD-10-CM | POA: Diagnosis present

## 2019-07-15 DIAGNOSIS — K219 Gastro-esophageal reflux disease without esophagitis: Secondary | ICD-10-CM | POA: Diagnosis present

## 2019-07-15 DIAGNOSIS — Z79890 Hormone replacement therapy (postmenopausal): Secondary | ICD-10-CM

## 2019-07-15 DIAGNOSIS — N1831 Chronic kidney disease, stage 3a: Secondary | ICD-10-CM | POA: Diagnosis present

## 2019-07-15 DIAGNOSIS — E1122 Type 2 diabetes mellitus with diabetic chronic kidney disease: Secondary | ICD-10-CM | POA: Diagnosis present

## 2019-07-15 HISTORY — DX: Chronic obstructive pulmonary disease, unspecified: J44.9

## 2019-07-15 LAB — ECHOCARDIOGRAM COMPLETE
Height: 61 in
Weight: 2592 oz

## 2019-07-15 LAB — CBC WITH DIFFERENTIAL/PLATELET
Abs Immature Granulocytes: 0.06 10*3/uL (ref 0.00–0.07)
Abs Immature Granulocytes: 0.07 10*3/uL (ref 0.00–0.07)
Basophils Absolute: 0.1 10*3/uL (ref 0.0–0.1)
Basophils Absolute: 0 10*3/uL (ref 0.0–0.1)
Basophils Relative: 1 %
Basophils Relative: 0 %
Eosinophils Absolute: 0 10*3/uL (ref 0.0–0.5)
Eosinophils Absolute: 0 10*3/uL (ref 0.0–0.5)
Eosinophils Relative: 0 %
Eosinophils Relative: 0 %
HCT: 30.1 % — ABNORMAL LOW (ref 36.0–46.0)
HCT: 30.6 % — ABNORMAL LOW (ref 36.0–46.0)
Hemoglobin: 9.9 g/dL — ABNORMAL LOW (ref 12.0–15.0)
Hemoglobin: 10 g/dL — ABNORMAL LOW (ref 12.0–15.0)
Immature Granulocytes: 1 %
Immature Granulocytes: 1 %
Lymphocytes Relative: 18 %
Lymphocytes Relative: 5 %
Lymphs Abs: 2.2 10*3/uL (ref 0.7–4.0)
Lymphs Abs: 0.7 10*3/uL (ref 0.7–4.0)
MCH: 30.7 pg (ref 26.0–34.0)
MCH: 30.6 pg (ref 26.0–34.0)
MCHC: 32.9 g/dL (ref 30.0–36.0)
MCHC: 32.7 g/dL (ref 30.0–36.0)
MCV: 93.5 fL (ref 80.0–100.0)
MCV: 93.6 fL (ref 80.0–100.0)
Monocytes Absolute: 0.8 10*3/uL (ref 0.1–1.0)
Monocytes Absolute: 0.2 10*3/uL (ref 0.1–1.0)
Monocytes Relative: 7 %
Monocytes Relative: 1 %
Neutro Abs: 8.9 10*3/uL — ABNORMAL HIGH (ref 1.7–7.7)
Neutro Abs: 12 10*3/uL — ABNORMAL HIGH (ref 1.7–7.7)
Neutrophils Relative %: 73 %
Neutrophils Relative %: 93 %
Platelets: 319 10*3/uL (ref 150–400)
Platelets: 298 10*3/uL (ref 150–400)
RBC: 3.22 MIL/uL — ABNORMAL LOW (ref 3.87–5.11)
RBC: 3.27 MIL/uL — ABNORMAL LOW (ref 3.87–5.11)
RDW: 14.6 % (ref 11.5–15.5)
RDW: 14.8 % (ref 11.5–15.5)
WBC: 12 10*3/uL — ABNORMAL HIGH (ref 4.0–10.5)
WBC: 12.9 10*3/uL — ABNORMAL HIGH (ref 4.0–10.5)
nRBC: 0 % (ref 0.0–0.2)
nRBC: 0 % (ref 0.0–0.2)

## 2019-07-15 LAB — BLOOD GAS, ARTERIAL
Acid-base deficit: 3 mmol/L — ABNORMAL HIGH (ref 0.0–2.0)
Bicarbonate: 21.1 mmol/L (ref 20.0–28.0)
Delivery systems: POSITIVE
Expiratory PAP: 6
FIO2: 0.3
Inspiratory PAP: 14
Mechanical Rate: 8
O2 Saturation: 95.4 %
Patient temperature: 37
pCO2 arterial: 34 mmHg (ref 32.0–48.0)
pH, Arterial: 7.4 (ref 7.350–7.450)
pO2, Arterial: 78 mmHg — ABNORMAL LOW (ref 83.0–108.0)

## 2019-07-15 LAB — COMPREHENSIVE METABOLIC PANEL
ALT: 17 U/L (ref 0–44)
AST: 15 U/L (ref 15–41)
Albumin: 3.6 g/dL (ref 3.5–5.0)
Alkaline Phosphatase: 72 U/L (ref 38–126)
Anion gap: 10 (ref 5–15)
BUN: 24 mg/dL — ABNORMAL HIGH (ref 8–23)
CO2: 20 mmol/L — ABNORMAL LOW (ref 22–32)
Calcium: 8.8 mg/dL — ABNORMAL LOW (ref 8.9–10.3)
Chloride: 106 mmol/L (ref 98–111)
Creatinine, Ser: 1.27 mg/dL — ABNORMAL HIGH (ref 0.44–1.00)
GFR calc Af Amer: 48 mL/min — ABNORMAL LOW (ref >60)
GFR calc non Af Amer: 42 mL/min — ABNORMAL LOW (ref >60)
Glucose, Bld: 285 mg/dL — ABNORMAL HIGH (ref 70–99)
Potassium: 3.9 mmol/L (ref 3.5–5.1)
Sodium: 136 mmol/L (ref 135–145)
Total Bilirubin: 0.6 mg/dL (ref 0.3–1.2)
Total Protein: 7 g/dL (ref 6.5–8.1)

## 2019-07-15 LAB — TROPONIN I (HIGH SENSITIVITY)
Troponin I (High Sensitivity): 54 ng/L — ABNORMAL HIGH (ref <18)
Troponin I (High Sensitivity): 48 ng/L — ABNORMAL HIGH (ref <18)

## 2019-07-15 LAB — LACTIC ACID, PLASMA
Lactic Acid, Venous: 1.5 mmol/L (ref 0.5–1.9)
Lactic Acid, Venous: 3 mmol/L (ref 0.5–1.9)

## 2019-07-15 LAB — PROCALCITONIN: Procalcitonin: <0.1 ng/mL

## 2019-07-15 LAB — BRAIN NATRIURETIC PEPTIDE: B Natriuretic Peptide: 593 pg/mL — ABNORMAL HIGH (ref 0.0–100.0)

## 2019-07-15 LAB — GLUCOSE, CAPILLARY
Glucose-Capillary: 473 mg/dL — ABNORMAL HIGH (ref 70–99)
Glucose-Capillary: 322 mg/dL — ABNORMAL HIGH (ref 70–99)

## 2019-07-15 LAB — SARS CORONAVIRUS 2 BY RT PCR (HOSPITAL ORDER, PERFORMED IN ~~LOC~~ HOSPITAL LAB): SARS Coronavirus 2: NEGATIVE

## 2019-07-15 MED ORDER — GUAIFENESIN ER 600 MG PO TB12
600.0000 mg | ORAL_TABLET | Freq: Two times a day (BID) | ORAL | Status: DC
  Administered 2019-07-15 – 2019-07-20 (×10): 600 mg via ORAL
  Filled 2019-07-15 (×13): qty 1

## 2019-07-15 MED ORDER — MELOXICAM 7.5 MG PO TABS
15.0000 mg | ORAL_TABLET | Freq: Every day | ORAL | Status: DC
  Administered 2019-07-15 – 2019-07-19 (×5): 15 mg via ORAL
  Filled 2019-07-15 (×7): qty 2

## 2019-07-15 MED ORDER — LEVOTHYROXINE SODIUM 25 MCG PO TABS
125.0000 ug | ORAL_TABLET | Freq: Every day | ORAL | Status: DC
  Administered 2019-07-15 – 2019-07-20 (×5): 125 ug via ORAL
  Filled 2019-07-15: qty 1
  Filled 2019-07-15: qty 3
  Filled 2019-07-15: qty 1
  Filled 2019-07-15: qty 3
  Filled 2019-07-15 (×2): qty 1

## 2019-07-15 MED ORDER — TRAMADOL HCL 50 MG PO TABS: 50.0000 mg | ORAL_TABLET | Freq: Two times a day (BID) | ORAL | Status: DC | PRN

## 2019-07-15 MED ORDER — SODIUM CHLORIDE 0.9% FLUSH: 3.0000 mL | INTRAVENOUS | Status: DC | PRN

## 2019-07-15 MED ORDER — VITAMIN D 25 MCG (1000 UNIT) PO TABS
1000.0000 [IU] | ORAL_TABLET | Freq: Every day | ORAL | Status: DC
  Administered 2019-07-15 – 2019-07-20 (×6): 1000 [IU] via ORAL
  Filled 2019-07-15 (×6): qty 1

## 2019-07-15 MED ORDER — INSULIN ASPART 100 UNIT/ML ~~LOC~~ SOLN
0.0000 [IU] | Freq: Three times a day (TID) | SUBCUTANEOUS | Status: DC
  Administered 2019-07-15 – 2019-07-16 (×2): 9 [IU] via SUBCUTANEOUS
  Administered 2019-07-16: 13:00:00 5 [IU] via SUBCUTANEOUS
  Administered 2019-07-17: 9 [IU] via SUBCUTANEOUS
  Administered 2019-07-17: 1 [IU] via SUBCUTANEOUS
  Administered 2019-07-17: 12:00:00 2 [IU] via SUBCUTANEOUS
  Administered 2019-07-18: 17:00:00 9 [IU] via SUBCUTANEOUS
  Administered 2019-07-18: 1 [IU] via SUBCUTANEOUS
  Administered 2019-07-18: 3 [IU] via SUBCUTANEOUS
  Administered 2019-07-19 (×2): 1 [IU] via SUBCUTANEOUS
  Administered 2019-07-20: 13:00:00 2 [IU] via SUBCUTANEOUS
  Administered 2019-07-20: 5 [IU] via SUBCUTANEOUS
  Filled 2019-07-15 (×13): qty 1

## 2019-07-15 MED ORDER — ENOXAPARIN SODIUM 40 MG/0.4ML ~~LOC~~ SOLN
40.0000 mg | SUBCUTANEOUS | Status: DC
  Administered 2019-07-15 – 2019-07-19 (×5): 40 mg via SUBCUTANEOUS
  Filled 2019-07-15 (×5): qty 0.4

## 2019-07-15 MED ORDER — SODIUM CHLORIDE 0.9 % IV SOLN
1.0000 g | INTRAVENOUS | Status: DC
  Administered 2019-07-15: 1 g via INTRAVENOUS
  Filled 2019-07-15: qty 10

## 2019-07-15 MED ORDER — METOPROLOL TARTRATE 25 MG PO TABS
25.0000 mg | ORAL_TABLET | Freq: Two times a day (BID) | ORAL | Status: DC
  Administered 2019-07-15 – 2019-07-20 (×10): 25 mg via ORAL
  Filled 2019-07-15 (×11): qty 1

## 2019-07-15 MED ORDER — SODIUM CHLORIDE 0.9 % IV SOLN: 250.0000 mL | INTRAVENOUS | Status: DC | PRN

## 2019-07-15 MED ORDER — MONTELUKAST SODIUM 10 MG PO TABS
10.0000 mg | ORAL_TABLET | Freq: Every day | ORAL | Status: DC
  Administered 2019-07-16 – 2019-07-19 (×4): 10 mg via ORAL
  Filled 2019-07-15 (×5): qty 1

## 2019-07-15 MED ORDER — FUROSEMIDE 10 MG/ML IJ SOLN
40.0000 mg | Freq: Two times a day (BID) | INTRAMUSCULAR | Status: DC
  Administered 2019-07-15 – 2019-07-17 (×4): 40 mg via INTRAVENOUS
  Filled 2019-07-15 (×4): qty 4

## 2019-07-15 MED ORDER — INSULIN GLARGINE 100 UNIT/ML SOLOSTAR PEN: 24.0000 [IU] | PEN_INJECTOR | Freq: Every day | SUBCUTANEOUS | Status: DC

## 2019-07-15 MED ORDER — FENOFIBRATE 54 MG PO TABS
54.0000 mg | ORAL_TABLET | Freq: Every day | ORAL | Status: DC
  Administered 2019-07-15 – 2019-07-20 (×5): 54 mg via ORAL
  Filled 2019-07-15 (×7): qty 1

## 2019-07-15 MED ORDER — GABAPENTIN 100 MG PO CAPS
100.0000 mg | ORAL_CAPSULE | Freq: Two times a day (BID) | ORAL | Status: DC
  Administered 2019-07-15 – 2019-07-20 (×10): 100 mg via ORAL
  Filled 2019-07-15 (×12): qty 1

## 2019-07-15 MED ORDER — PREDNISONE 20 MG PO TABS: 40.0000 mg | ORAL_TABLET | Freq: Every day | ORAL | Status: DC

## 2019-07-15 MED ORDER — LINAGLIPTIN 5 MG PO TABS
5.0000 mg | ORAL_TABLET | Freq: Every day | ORAL | Status: DC
  Administered 2019-07-15 – 2019-07-20 (×6): 5 mg via ORAL
  Filled 2019-07-15 (×7): qty 1

## 2019-07-15 MED ORDER — ASPIRIN EC 81 MG PO TBEC: 81.0000 mg | DELAYED_RELEASE_TABLET | Freq: Every day | ORAL | Status: DC

## 2019-07-15 MED ORDER — SODIUM CHLORIDE 0.9% FLUSH
3.0000 mL | Freq: Two times a day (BID) | INTRAVENOUS | Status: DC
  Administered 2019-07-15 – 2019-07-20 (×8): 3 mL via INTRAVENOUS

## 2019-07-15 MED ORDER — ZOLPIDEM TARTRATE 5 MG PO TABS: 5.0000 mg | ORAL_TABLET | Freq: Every evening | ORAL | Status: DC | PRN

## 2019-07-15 MED ORDER — CITALOPRAM HYDROBROMIDE 20 MG PO TABS
20.0000 mg | ORAL_TABLET | Freq: Every day | ORAL | Status: DC
  Administered 2019-07-15 – 2019-07-20 (×6): 20 mg via ORAL
  Filled 2019-07-15 (×6): qty 1

## 2019-07-15 MED ORDER — NITROGLYCERIN 0.2 MG/HR TD PT24
0.2000 mg | MEDICATED_PATCH | Freq: Every day | TRANSDERMAL | Status: DC
  Administered 2019-07-15 – 2019-07-20 (×5): 0.2 mg via TRANSDERMAL
  Filled 2019-07-15 (×7): qty 1

## 2019-07-15 MED ORDER — FAMOTIDINE 20 MG PO TABS
40.0000 mg | ORAL_TABLET | Freq: Every day | ORAL | Status: DC
  Administered 2019-07-15 – 2019-07-19 (×5): 40 mg via ORAL
  Filled 2019-07-15 (×5): qty 2

## 2019-07-15 MED ORDER — METHYLPREDNISOLONE SODIUM SUCC 40 MG IJ SOLR
40.0000 mg | Freq: Four times a day (QID) | INTRAMUSCULAR | Status: DC
  Administered 2019-07-15 (×2): 40 mg via INTRAVENOUS
  Filled 2019-07-15 (×2): qty 1

## 2019-07-15 MED ORDER — VITAMIN B-12 1000 MCG PO TABS
1000.0000 ug | ORAL_TABLET | Freq: Every day | ORAL | Status: DC
  Administered 2019-07-15 – 2019-07-20 (×6): 1000 ug via ORAL
  Filled 2019-07-15 (×7): qty 1

## 2019-07-15 MED ORDER — PREDNISONE 50 MG PO TABS
50.0000 mg | ORAL_TABLET | Freq: Every day | ORAL | Status: DC
  Filled 2019-07-15: qty 1

## 2019-07-15 MED ORDER — NICOTINE 21 MG/24HR TD PT24
21.0000 mg | MEDICATED_PATCH | Freq: Every day | TRANSDERMAL | Status: DC
  Administered 2019-07-16 – 2019-07-20 (×2): 21 mg via TRANSDERMAL
  Filled 2019-07-15 (×4): qty 1

## 2019-07-15 MED ORDER — INSULIN ASPART 100 UNIT/ML ~~LOC~~ SOLN
0.0000 [IU] | Freq: Every day | SUBCUTANEOUS | Status: DC
  Administered 2019-07-15: 4 [IU] via SUBCUTANEOUS
  Administered 2019-07-16: 3 [IU] via SUBCUTANEOUS
  Administered 2019-07-17 – 2019-07-18 (×2): 4 [IU] via SUBCUTANEOUS
  Administered 2019-07-19: 21:00:00 5 [IU] via SUBCUTANEOUS
  Filled 2019-07-15 (×5): qty 1

## 2019-07-15 MED ORDER — FUROSEMIDE 10 MG/ML IJ SOLN
20.0000 mg | Freq: Once | INTRAMUSCULAR | Status: AC
  Administered 2019-07-15: 20 mg via INTRAVENOUS
  Filled 2019-07-15: qty 4

## 2019-07-15 MED ORDER — IPRATROPIUM-ALBUTEROL 0.5-2.5 (3) MG/3ML IN SOLN
3.0000 mL | Freq: Four times a day (QID) | RESPIRATORY_TRACT | Status: DC
  Administered 2019-07-15 – 2019-07-18 (×13): 3 mL via RESPIRATORY_TRACT
  Filled 2019-07-15 (×14): qty 3

## 2019-07-15 MED ORDER — INSULIN GLARGINE 100 UNIT/ML ~~LOC~~ SOLN
24.0000 [IU] | Freq: Every day | SUBCUTANEOUS | Status: DC
  Administered 2019-07-15 – 2019-07-19 (×5): 24 [IU] via SUBCUTANEOUS
  Filled 2019-07-15 (×6): qty 0.24

## 2019-07-15 MED ORDER — AZITHROMYCIN 250 MG PO TABS
250.0000 mg | ORAL_TABLET | Freq: Every day | ORAL | Status: DC
  Filled 2019-07-15: qty 1

## 2019-07-15 MED ORDER — RISPERIDONE 1 MG PO TBDP
0.5000 mg | ORAL_TABLET | Freq: Two times a day (BID) | ORAL | Status: DC
  Administered 2019-07-15 – 2019-07-20 (×10): 0.5 mg via ORAL
  Filled 2019-07-15 (×5): qty 0.5
  Filled 2019-07-15: qty 1
  Filled 2019-07-15: qty 0.5
  Filled 2019-07-15: qty 1
  Filled 2019-07-15: qty 0.5
  Filled 2019-07-15: qty 1
  Filled 2019-07-15 (×3): qty 0.5

## 2019-07-15 MED ORDER — PRIMIDONE 50 MG PO TABS
50.0000 mg | ORAL_TABLET | Freq: Two times a day (BID) | ORAL | Status: DC
  Administered 2019-07-15 – 2019-07-20 (×10): 50 mg via ORAL
  Filled 2019-07-15 (×14): qty 1

## 2019-07-15 MED ORDER — IRBESARTAN 150 MG PO TABS
150.0000 mg | ORAL_TABLET | Freq: Every day | ORAL | Status: DC
  Filled 2019-07-15 (×2): qty 1

## 2019-07-15 MED ORDER — MAGNESIUM OXIDE 400 (241.3 MG) MG PO TABS
400.0000 mg | ORAL_TABLET | Freq: Every day | ORAL | Status: DC
  Administered 2019-07-15 – 2019-07-20 (×6): 400 mg via ORAL
  Filled 2019-07-15 (×7): qty 1

## 2019-07-15 MED ORDER — SODIUM CHLORIDE 0.9 % IV SOLN
500.0000 mg | INTRAVENOUS | Status: DC
  Administered 2019-07-15: 500 mg via INTRAVENOUS
  Filled 2019-07-15: qty 500

## 2019-07-15 MED ORDER — CALCITRIOL 0.25 MCG PO CAPS
0.2500 ug | ORAL_CAPSULE | Freq: Every day | ORAL | Status: DC
  Administered 2019-07-15 – 2019-07-20 (×5): 0.25 ug via ORAL
  Filled 2019-07-15 (×7): qty 1

## 2019-07-15 MED ORDER — DOCUSATE SODIUM 100 MG PO CAPS
100.0000 mg | ORAL_CAPSULE | Freq: Two times a day (BID) | ORAL | Status: DC
  Administered 2019-07-15 – 2019-07-20 (×10): 100 mg via ORAL
  Filled 2019-07-15 (×11): qty 1

## 2019-07-15 MED ORDER — ASPIRIN EC 81 MG PO TBEC
81.0000 mg | DELAYED_RELEASE_TABLET | Freq: Every day | ORAL | Status: DC
  Administered 2019-07-15 – 2019-07-20 (×6): 81 mg via ORAL
  Filled 2019-07-15 (×6): qty 1

## 2019-07-15 MED ORDER — HYDROCOD POLST-CPM POLST ER 10-8 MG/5ML PO SUER: 5.0000 mL | Freq: Two times a day (BID) | ORAL | Status: DC | PRN

## 2019-07-15 MED ORDER — ACETAMINOPHEN 325 MG PO TABS
650.0000 mg | ORAL_TABLET | ORAL | Status: DC | PRN
  Administered 2019-07-16: 22:00:00 650 mg via ORAL
  Filled 2019-07-15: qty 2

## 2019-07-15 MED ORDER — LOSARTAN POTASSIUM 50 MG PO TABS
50.0000 mg | ORAL_TABLET | Freq: Every day | ORAL | Status: DC
  Administered 2019-07-15 – 2019-07-20 (×6): 50 mg via ORAL
  Filled 2019-07-15 (×6): qty 1

## 2019-07-15 MED ORDER — BENZTROPINE MESYLATE 0.5 MG PO TABS
0.5000 mg | ORAL_TABLET | Freq: Two times a day (BID) | ORAL | Status: DC
  Administered 2019-07-15 – 2019-07-20 (×10): 0.5 mg via ORAL
  Filled 2019-07-15 (×13): qty 1

## 2019-07-15 MED ORDER — ALPRAZOLAM 0.25 MG PO TABS
0.2500 mg | ORAL_TABLET | Freq: Two times a day (BID) | ORAL | Status: DC | PRN
  Filled 2019-07-15: qty 1

## 2019-07-15 MED ORDER — AMLODIPINE BESYLATE 10 MG PO TABS
10.0000 mg | ORAL_TABLET | Freq: Every day | ORAL | Status: DC
  Administered 2019-07-15 – 2019-07-20 (×6): 10 mg via ORAL
  Filled 2019-07-15: qty 2
  Filled 2019-07-15 (×5): qty 1

## 2019-07-15 MED ORDER — BREXPIPRAZOLE 1 MG PO TABS
1.0000 mg | ORAL_TABLET | Freq: Every day | ORAL | Status: DC
  Administered 2019-07-16 – 2019-07-19 (×4): 1 mg via ORAL
  Filled 2019-07-15 (×6): qty 1

## 2019-07-15 MED ORDER — ONDANSETRON HCL 4 MG/2ML IJ SOLN: 4.0000 mg | Freq: Four times a day (QID) | INTRAMUSCULAR | Status: DC | PRN

## 2019-07-15 MED ORDER — GABAPENTIN 300 MG PO CAPS
300.0000 mg | ORAL_CAPSULE | Freq: Every day | ORAL | Status: DC
  Administered 2019-07-16 – 2019-07-19 (×2): 300 mg via ORAL
  Filled 2019-07-15 (×4): qty 1

## 2019-07-15 MED ORDER — FLUTICASONE PROPIONATE 50 MCG/ACT NA SUSP
2.0000 | Freq: Every day | NASAL | Status: DC
  Administered 2019-07-16 – 2019-07-20 (×5): 2 via NASAL
  Filled 2019-07-15: qty 16

## 2019-07-15 MED ORDER — ROSUVASTATIN CALCIUM 20 MG PO TABS
40.0000 mg | ORAL_TABLET | Freq: Every day | ORAL | Status: DC
  Administered 2019-07-16 – 2019-07-19 (×4): 40 mg via ORAL
  Filled 2019-07-15 (×5): qty 2

## 2019-07-15 NOTE — ED Notes (Signed)
Pt oxygen sats maintained at 96-100% on 3L [place]. Pt now placed on 2L [place]. Will continue to monitor. Pt denies any needs at this time.

## 2019-07-15 NOTE — ED Triage Notes (Signed)
Pt arrives [facility] from home w cc of respiratory distress. PT also reports some 7/10 upper chest pain. Pt has hx COPD and CHF. PT has been sleeping with more pillows (2) lately. Pt is receiving 2nd duoneb from [facility] at 8L mask. Pt has 22G L hand

## 2019-07-15 NOTE — Progress Notes (Signed)
*  PRELIMINARY RESULTS* Echocardiogram 2D Echocardiogram has been performed.  Melanie King 07/15/2019, 8:41 AM

## 2019-07-15 NOTE — Evaluation (Signed)
Physical Therapy Evaluation Patient Details Name: Melanie King MRN: [id number] DOB: Dec 31, 1945 Today's Date: 07/15/2019   History of Present Illness  74 y.o. Caucasian female with a known history of CHF, COPD, coronary artery disease, type 2 diabetes mellitus, depression and GERD, who presented to the emergency room with acute onset of worsening dyspnea with associated dry cough and wheezing, orthopnea and paroxysmal nocturnal dyspnea  Clinical Impression  Pt on BiPAP on arrival spoke with nursing who okays to remove and place on 4-5L via [place].  Pt's O2 remained in the high 90s t/o PT session, including during bouts of ambulation.  Overall she is weaker and more guarded than her baseline but showed ability to manage in-home distances relatively well (better with walker, suggested to always use an AD when she goes home.  Pt with some increased HR during activity (to ~100) but does not endorse excessive fatigue or issues - feels only minimally weaker than her baseline and feels safe to go home with HHPT.      Follow Up Recommendations Supervision - Intermittent;Follow surgeon's recommendation for DC plan and follow-up therapies    Equipment Recommendations  None recommended by PT    Recommendations for Other Services       Precautions / Restrictions Precautions Precautions: Fall Restrictions Weight Bearing Restrictions: No      Mobility  Bed Mobility Overal bed mobility: Needs Assistance Bed Mobility: Supine to Sit;Sit to Supine     Supine to sit: Min assist Sit to supine: Min assist   General bed mobility comments: Pt showed good effort in getting out/into bed, did ultimately need some assist to fully complete transitions  Transfers Overall transfer level: Needs assistance Equipment used: Rolling walker (2 wheeled) Transfers: Sit to/from Stand Sit to Stand: Min assist         General transfer comment: Pt was able to initiate getting to standing, did need CGA to insure  she shifted weight forward and used walker appropriately.  Ambulation/Gait Ambulation/Gait assistance: Min guard Gait Distance (Feet): 60 Feet Assistive device: Rolling walker (2 wheeled);None       General Gait Details: 30 ft with walker, 30 ft w/o.  Pt with no overt LOBs with either effort, but slower and clearly more guarded w/o AD.  PT recommended that she use her rollator all the time at home (at least initially until HHPT has cleared her w/o it)  Financial trader Rank[employer] (Stroke Patients Only)       Balance Overall balance assessment: Needs assistance Sitting-balance support: No upper extremity supported;Feet supported Sitting balance-Leahy Scale: Good     Standing balance support: Bilateral upper extremity supported Standing balance-Leahy Scale: Fair                               Pertinent Vitals/Pain Pain Assessment: No/denies pain Pain Location: (chronic vague back pain that is not limiting)    Home Living Family/patient expects to be discharged to:: Private residence Living Arrangements: Non-relatives/Friends Available Help at Discharge: Friend(s);Available PRN/intermittently Type of Home: Apartment Home Access: Stairs to enter Entrance Stairs-Rails: None Entrance Stairs-Number of Steps: 1 Home Layout: One level Home Equipment: Walker - 4 wheels;Cane - single point Additional Comments: Pt reports she lives with her friend [name], however she is looking into moving into an ALF for increased assist/supervision.    Prior Function Level of Independence:  Independent with assistive device(s)         Comments: prior notes report she uses rollator all the time, today she reports it just depends on how she feels     Hand Dominance        Extremity/Trunk Assessment   Upper Extremity Assessment Upper Extremity Assessment: Overall WFL for tasks assessed;Generalized weakness    Lower Extremity  Assessment Lower Extremity Assessment: Overall WFL for tasks assessed;Generalized weakness       Communication   Communication: HOH  Cognition Arousal/Alertness: Awake/alert Behavior During Therapy: WFL for tasks assessed/performed Overall Cognitive Status: Difficult to assess                                 General Comments: unsure of date, thought she was at [facility]      General Comments      Exercises     Assessment/Plan    PT Assessment Patient needs continued PT services  PT Problem List Decreased strength;Decreased activity tolerance;Decreased balance;Decreased mobility;Decreased safety awareness       PT Treatment Interventions Gait training;DME instruction;Therapeutic exercise;Balance training;Functional mobility training;Therapeutic activities;Patient/family education    PT Goals (Current goals can be found in the Care Plan section)  Acute Rehab PT Goals Patient Stated Goal: reports she is working on getting into an ALF PT Goal Formulation: With patient/family Time For Goal Achievement: 06/29/19 Potential to Achieve Goals: Good    Frequency Min 2X/week   Barriers to discharge        Co-evaluation               AM-PAC PT "6 Clicks" Mobility  Outcome Measure Help needed turning from your back to your side while in a flat bed without using bedrails?: A Little Help needed moving from lying on your back to sitting on the side of a flat bed without using bedrails?: A Little Help needed moving to and from a bed to a chair (including a wheelchair)?: A Little Help needed standing up from a chair using your arms (e.g., wheelchair or bedside chair)?: A Little Help needed to walk in hospital room?: A Little Help needed climbing 3-5 steps with a railing? : A Little 6 Click Score: 18    End of Session Equipment Utilized During Treatment: Gait belt Activity Tolerance: Patient tolerated treatment well Patient left: in bed;with call  bell/phone within reach;with family/visitor present Nurse Communication: Mobility status PT Visit Diagnosis: Muscle weakness (generalized) (M62.81);Difficulty in walking, not elsewhere classified (R26.2);Repeated falls (R29.6)    Time: 1000-1028 PT Time Calculation (min) (ACUTE ONLY): 28 min   Charges:   PT Evaluation $PT Eval Low Complexity: 1 Low PT Treatments $Gait Training: 8-22 mins        Malachi Pro, DPT 07/15/2019, 1:16 PM

## 2019-07-15 NOTE — ED Notes (Signed)
Pt has both hearing aids in. This RN helped pt adjust right hearing aid. Hearing aids remain on pt at this time

## 2019-07-15 NOTE — ED Notes (Signed)
This tech and Kayla, EDT provided pt with a complete bath and linen change. Placed a new female purewick and dry brief.

## 2019-07-15 NOTE — ED Notes (Signed)
Echo being performed at this time, will return once completed to finisha administering medications, pt is a/ox4 at present. SR noted on the CM.

## 2019-07-15 NOTE — ED Provider Notes (Signed)
[facility] Emergency Department Provider Note   ____________________________________________   First MD Initiated Contact with Patient 07/15/19 [phone]     (approximate)  I have reviewed the triage vital signs and the nursing notes.   HISTORY  Chief Complaint Respiratory Distress    HPI Melanie King is a 74 y.o. female brought to the ED via EMS from home with a chief complaint respiratory distress.  Patient with a history of diabetes, COPD, CHF not on home oxygen who reports orthopnea x2 nights.  Symptoms associated with chest pain.  EMS reports room air saturations in the mid 70s upon their arrival.  Patient was given 125 mg IV Solu-Medrol and 2 DuoNeb's.  Patient denies fever, cough, abdominal pain, nausea, vomiting or diarrhea.  No known COVID-19 exposure.       Past Medical History:  Diagnosis Date  . Arthritis   . CHF (congestive heart failure) (HCC)   . COPD (chronic obstructive pulmonary disease) (HCC)   . Coronary arteriosclerosis   . DDD (degenerative disc disease), cervical   . DDD (degenerative disc disease), lumbar   . Depression   . Diabetes mellitus without complication (HCC)   . GERD (gastroesophageal reflux disease)   . Hyperlipidemia   . Hypertension   . Osteoarthritis   . Pacemaker   . Tremor     Patient Active Problem List   Diagnosis Date Noted  . Frequent falls 05/26/2019  . Generalized weakness 05/26/2019  . AKI (acute kidney injury) (HCC) 03/21/2019  . Subdural hematoma (HCC) 03/20/2019  . Hypothyroidism 03/10/2019  . Depression   . Chronic diastolic CHF (congestive heart failure) (HCC)   . Acute metabolic encephalopathy   . Fall   . Tobacco abuse   . C6 cervical fracture (HCC)   . Acute delirium 05/12/2017  . Seizure (HCC) 05/10/2017  . Elevated sedimentation rate 04/04/2017  . Elevated C-reactive protein (CRP) 04/04/2017  . Chest pain, unspecified 04/02/2017  . Bilateral lower extremity pain (Secondary Area  of Pain) (R>L) 04/02/2017  . Chronic hip pain, bilateral  (Secondary Area of Pain) (R>L) 04/02/2017  . Chronic bilateral low back pain with bilateral sciatica [facility] Area of Pain) (R>L) 04/02/2017  . Chronic pain syndrome 04/02/2017  . Disorder of bone, unspecified 04/02/2017  . Other long term (current) drug therapy 04/02/2017  . Other specified health status 04/02/2017  . Long term current use of opiate analgesic 04/02/2017  . Hip pain, bilateral 12/06/2016  . Tremor 05/27/2016  . Trigger finger of left hand 02/16/2016  . Osteoarthritis of both hands 02/16/2016  . Angina pectoris (HCC) 01/17/2016  . COPD (chronic obstructive pulmonary disease) (HCC) 01/17/2016  . CAD (coronary artery disease) 01/17/2016  . HLD (hyperlipidemia) 01/17/2016  . Hypertension 01/17/2016  . Sick sinus syndrome (HCC) 01/17/2016  . Chronic pain 01/17/2016  . Personal history of tobacco use, presenting hazards to health 11/16/2015  . Type II diabetes mellitus with renal manifestations (HCC) 05/12/2013    Past Surgical History:  Procedure Laterality Date  . ABDOMINAL HYSTERECTOMY    . BREAST BIOPSY Right 1994   neg cyst removed  . CHOLECYSTECTOMY    . EYE SURGERY  1964, 1966, and 1967   bilateral  . FOOT SURGERY Right    cellulitis  . PACEMAKER INSERTION    . SPINE SURGERY     cyst removed; [facility]    Prior to Admission medications   Medication Sig Start Date End Date Taking? Authorizing Provider  amLODipine (NORVASC) 10  MG tablet TAKE 1 TABLET BY MOUTH EVERY DAY 08/28/16  Yes Kathrine Haddock, NP  aspirin EC 81 MG tablet Take 81 mg by mouth daily.   Yes [provider]  benztropine (COGENTIN) 0.5 MG tablet Take 0.5 mg by mouth 2 (two) times daily.   Yes [provider]  brexpiprazole (REXULTI) 1 MG TABS tablet Take 1 mg by mouth at bedtime.   Yes [provider]  calcitRIOL (ROCALTROL) 0.25 MCG capsule Take 0.25 mcg by mouth daily.   Yes [provider]   cholecalciferol (VITAMIN D3) 25 MCG (1000 UNIT) tablet Take 1,000 Units by mouth daily.   Yes [provider]  citalopram (CELEXA) 20 MG tablet Take 20 mg by mouth daily.   Yes [provider]  docusate sodium (COLACE) 100 MG capsule Take 100 mg by mouth 2 (two) times daily.   Yes [provider]  famotidine (PEPCID) 40 MG tablet Take 40 mg by mouth daily.   Yes [provider]  fenofibrate (TRICOR) 48 MG tablet Take 48 mg by mouth daily.   Yes [provider]  fluticasone (FLONASE) 50 MCG/ACT nasal spray Place 2 sprays into both nostrils daily.   Yes [provider]  furosemide (LASIX) 20 MG tablet Take 20 mg by mouth daily.   Yes [provider]  gabapentin (NEURONTIN) 100 MG capsule Take 100-300 mg by mouth See admin instructions. Take 1 capsule (100mg ) by mouth every morning and daily at noon and take 3 capsules (300mg ) by mouth every night   Yes [provider]  insulin aspart protamine- aspart (NOVOLOG MIX 70/30) (70-30) 100 UNIT/ML injection Inject into the skin 2 (two) times daily with a meal.   Yes [provider]  LANTUS SOLOSTAR 100 UNIT/ML Solostar Pen Inject 24 Units into the skin at bedtime.  03/19/19  Yes [provider]  levothyroxine (SYNTHROID) 125 MCG tablet Take 1 tablet (125 mcg total) by mouth daily before breakfast. 05/28/19  Yes Dhungel, Nishant, MD  losartan (COZAAR) 50 MG tablet Take 50 mg by mouth daily.   Yes [provider]  magnesium oxide (MAG-OX) 400 MG tablet Take 400 mg by mouth daily.   Yes [provider]  meloxicam (MOBIC) 15 MG tablet Take 15 mg by mouth daily. 05/30/19  Yes [provider]  metFORMIN (GLUCOPHAGE) 1000 MG tablet Take 0.5 tablets (500 mg total) by mouth 2 (two) times daily with a meal. 05/28/19  Yes Dhungel, Nishant, MD  metoprolol tartrate (LOPRESSOR) 25 MG tablet Take 25 mg by mouth 2 (two) times daily.   Yes [provider]   montelukast (SINGULAIR) 10 MG tablet Take 10 mg by mouth at bedtime.   Yes [provider]  nitroGLYCERIN (NITRODUR - DOSED IN MG/24 HR) 0.2 mg/hr patch Place 0.2 mg onto the skin daily. leave patch on 12-14 hours, then remove for 10-12 hours prior to applying the next patch   Yes [provider]  primidone (MYSOLINE) 50 MG tablet Take 50 mg by mouth 2 (two) times daily.   Yes [provider]  risperiDONE (RISPERDAL M-TABS) 0.5 MG disintegrating tablet Take 0.5 mg by mouth 2 (two) times daily.   Yes [provider]  rosuvastatin (CRESTOR) 40 MG tablet Take 40 mg by mouth at bedtime.   Yes [provider]  sitaGLIPtin (JANUVIA) 50 MG tablet Take 50 mg by mouth daily.   Yes [provider]  traMADol (ULTRAM) 50 MG tablet Take 1 tablet (50 mg total)  by mouth every 12 (twelve) hours as needed for moderate pain. 05/28/19  Yes Dhungel, Nishant, MD  valsartan (DIOVAN) 160 MG tablet Take 160 mg by mouth daily.   Yes [provider]  vitamin B-12 (CYANOCOBALAMIN) 1000 MCG tablet Take 1,000 mcg by mouth daily.   Yes [provider]  nicotine (NICODERM CQ - DOSED IN MG/24 HOURS) 21 mg/24hr patch Place 1 patch (21 mg total) onto the skin daily. Patient not taking: Reported on 07/15/2019 03/13/19   Dhungel, Theda Belfast, MD    Allergies Alprazolam, Fentanyl, Meperidine, and Ranitidine hcl  Family History  Problem Relation Age of Onset  . Breast cancer Maternal Aunt 40  . Cancer Mother        colon  . Aneurysm Father     Social History Social History   Tobacco Use  . Smoking status: Current Every Day Smoker    Packs/day: 1.00    Years: 50.00    Pack years: 50.00    Types: Cigarettes  . Smokeless tobacco: Never Used  Substance Use Topics  . Alcohol use: No  . Drug use: No    Review of Systems  Constitutional: No fever/chills Eyes: No visual changes. ENT: No sore throat. Cardiovascular: Positive for chest pain. Respiratory:  Positive for shortness of breath. Gastrointestinal: No abdominal pain.  No nausea, no vomiting.  No diarrhea.  No constipation. Genitourinary: Negative for dysuria. Musculoskeletal: Negative for back pain. Skin: Negative for rash. Neurological: Negative for headaches, focal weakness or numbness.   ____________________________________________   PHYSICAL EXAM:  VITAL SIGNS: ED Triage Vitals  Enc Vitals Group     BP      Pulse      Resp      Temp      Temp src      SpO2      Weight      Height      Head Circumference      Peak Flow      Pain Score      Pain Loc      Pain Edu?      Excl. in GC?     Constitutional: Alert and oriented.  Uncomfortable appearing and in moderate acute distress. Eyes: Conjunctivae are normal. PERRL. EOMI. Head: Atraumatic. Nose: No congestion/rhinnorhea. Mouth/Throat: Mucous membranes are moist.   Neck: No stridor.   Cardiovascular: Normal rate, regular rhythm. Grossly normal heart sounds.  Good peripheral circulation. Respiratory: Increased respiratory effort.  No retractions. Lungs with audible wheezing and rales. Gastrointestinal: Soft and nontender. No distention. No abdominal bruits. No CVA tenderness. Musculoskeletal: No lower extremity tenderness.  1+ BLE nonpitting edema.  No joint effusions. Neurologic:  Normal speech and language. No gross focal neurologic deficits are appreciated.  Skin:  Skin is warm, dry and intact. No rash noted. Psychiatric: Mood and affect are normal. Speech and behavior are normal.  ____________________________________________   LABS (all labs ordered are listed, but only abnormal results are displayed)  Labs Reviewed  CBC WITH DIFFERENTIAL/PLATELET - Abnormal; Notable for the following components:      Result Value   WBC 12.0 (*)    RBC 3.22 (*)    Hemoglobin 9.9 (*)    HCT 30.1 (*)    Neutro Abs 8.9 (*)    All other components within normal limits  COMPREHENSIVE METABOLIC PANEL - Abnormal; Notable  for the following components:   CO2 20 (*)    Glucose, Bld 285 (*)    BUN 24 (*)  Creatinine, Ser 1.27 (*)    Calcium 8.8 (*)    GFR calc non Af Amer 42 (*)    GFR calc Af Amer 48 (*)    All other components within normal limits  BRAIN NATRIURETIC PEPTIDE - Abnormal; Notable for the following components:   B Natriuretic Peptide 593.0 (*)    All other components within normal limits  TROPONIN I (HIGH SENSITIVITY) - Abnormal; Notable for the following components:   Troponin I (High Sensitivity) 54 (*)    All other components within normal limits  CULTURE, BLOOD (ROUTINE X 2)  CULTURE, BLOOD (ROUTINE X 2)  SARS CORONAVIRUS 2 BY RT PCR (HOSPITAL ORDER, PERFORMED IN [facility])  LACTIC ACID, PLASMA  LACTIC ACID, PLASMA  BLOOD GAS, ARTERIAL   ____________________________________________  EKG  ED ECG REPORT I, Kahlia Lagunes J, the attending physician, personally viewed and interpreted this ECG.   Date: 07/15/2019  EKG Time: 0109  Rate: 72  Rhythm: normal EKG, normal sinus rhythm  Axis: Normal  Intervals:none  ST&T Change: Nonspecific  ____________________________________________  RADIOLOGY  ED MD interpretation: CHF  Official radiology report(s): DG Chest Port 1 View  Result Date: 07/15/2019 CLINICAL DATA:  Respiratory distress, upper chest pain EXAM: PORTABLE CHEST 1 VIEW COMPARISON:  06/12/2019 FINDINGS: Single frontal view of the chest demonstrates stable dual lead pacemaker. Cardiac silhouette is enlarged. There is vascular congestion with diffuse interstitial and alveolar opacities consistent with edema. Small right pleural effusion. No pneumothorax. IMPRESSION: 1. Congestive heart failure. Electronically Signed   By: Sharlet Salina M.D.   On: 07/15/2019 01:07    ____________________________________________   PROCEDURES  Procedure(s) performed (including Critical Care):  .1-3 Lead EKG Interpretation Performed by: Irean Hong, MD Authorized by:  Irean Hong, MD     Interpretation: normal     ECG rate:  70   ECG rate assessment: normal     Rhythm: sinus rhythm     Ectopy: none     Conduction: normal   Comments:     Patient placed on cardiac monitor to monitor for arrhythmias   CRITICAL CARE Performed by: Irean Hong   Total critical care time: 45 minutes  Critical care time was exclusive of separately billable procedures and treating other patients.  Critical care was necessary to treat or prevent imminent or life-threatening deterioration.  Critical care was time spent personally by me on the following activities: development of treatment plan with patient and/or surrogate as well as nursing, discussions with consultants, evaluation of patient's response to treatment, examination of patient, obtaining history from patient or surrogate, ordering and performing treatments and interventions, ordering and review of laboratory studies, ordering and review of radiographic studies, pulse oximetry and re-evaluation of patient's condition.   ____________________________________________   INITIAL IMPRESSION / ASSESSMENT AND PLAN / ED COURSE  As part of my medical decision making, I reviewed the following data within the electronic MEDICAL RECORD NUMBER Nursing notes reviewed and incorporated, Labs reviewed, EKG interpreted, Old chart reviewed, Radiograph reviewed, Discussed with admitting physician and Notes from prior ED visits     Melanie King was evaluated in Emergency Department on 07/15/2019 for the symptoms described in the history of present illness. She was evaluated in the context of the global COVID-19 pandemic, which necessitated consideration that the patient might be at risk for infection with the SARS-CoV-2 virus that causes COVID-19. Institutional protocols and algorithms that pertain to the evaluation of patients at risk for COVID-19 are in a state  of rapid change based on information released by regulatory bodies  including the CDC and federal and state organizations. These policies and algorithms were followed during the patient's care in the ED.    74 year old female presenting in respiratory distress. Differential includes, but is not limited to, viral syndrome, bronchitis including COPD exacerbation, pneumonia, reactive airway disease including asthma, CHF including exacerbation with or without pulmonary/interstitial edema, pneumothorax, ACS, thoracic trauma, and pulmonary embolism.  Patient placed on BiPAP upon her arrival to the treatment room.  Will obtain sepsis work-up, chest x-ray.  Administer additional DuoNeb.  Consider diuresis.  Anticipate hospitalization.   Clinical Course as of Jul 15 203  Tue Jul 15, 2019  0150 Improving on BiPAP.  IV Lasix ordered for CHF seen on chest x-ray.  Will discuss with hospitalist services for admission.   [JS]    Clinical Course User Index [JS] Irean Hong, MD     ____________________________________________   FINAL CLINICAL IMPRESSION(S) / ED DIAGNOSES  Final diagnoses:  Respiratory distress  Chronic obstructive pulmonary disease with acute exacerbation (HCC)  Acute on chronic congestive heart failure, unspecified heart failure type [facility])  Hypoxia     ED Discharge Orders    None       Note:  This document was prepared using Dragon voice recognition software and may include unintentional dictation errors.   Irean Hong, MD 07/15/19 [phone]

## 2019-07-15 NOTE — Progress Notes (Addendum)
Inpatient Diabetes Program Recommendations  AACE/ADA: New Consensus Statement on Inpatient Glycemic Control (2015)  Target Ranges:  Prepandial:   less than 140 mg/dL      Peak postprandial:   less than 180 mg/dL (1-2 hours)      Critically ill patients:  140 - 180 mg/dL   Results for [name], Melanie F (MRN [id number]) as of 07/15/2019 10:28  Ref. Range 07/15/2019 00:55  Glucose Latest Ref Range: 70 - 99 mg/dL 544 (H)  40 mg Solumedrol given at 5:28am    Admit with: Acute respiratory failure secondary to acute on chronic diastolic CHF as well as COPD exacerbation  History: DM, COPD, CHF  Home DM Meds: Lantus 24 units QHS       70/30 Insulin BID per SSI       Metformin 500 mg BID       Januvia 50 mg Daily  Current Orders: Lantus 24 units QHS      Tradjenta 5 mg Daily     Getting Solumedrol 40 mg Q6 hours today.  To switch to Prednisone 40 mg Daily tomorrow.    MD- Please consider placing orders for Novolog Moderate Correction Scale/ SSI (0-15 units) TID AC + HS    --Will follow patient during hospitalization--  Ambrose Finland RN, MSN, CDE Diabetes Coordinator Inpatient Glycemic Control Team Team Pager: [phone] (8a-5p)

## 2019-07-15 NOTE — Progress Notes (Signed)
OT Cancellation Note  Patient Details Name: Melanie King MRN: [id number] DOB: [birth year]   Cancelled Treatment:    Reason Eval/Treat Not Completed: Patient at procedure or test/ unavailable  OT consult received and chart reviewed. Pt with physical therapy at this time. Will f/u as able. Thank you.  Rejeana Brock, MS, OTR/L ascom [phone] 07/15/19, 10:26 AM

## 2019-07-15 NOTE — ED Notes (Signed)
Pt had competed PT consult off of bipap and tolerated well on 4L [place], pt has since been taken down to [facility] and is toldering well sats, 96-99%. Will continue to monitor the pt

## 2019-07-15 NOTE — Progress Notes (Signed)
[facility] [place] NAME: Melanie King    MR#:  [id number]  DATE OF BIRTH:  09-11-1945  SUBJECTIVE:  patient came in with increasing shortness of breath a few days. She was on BiPAP. Currently breathing comfortably able to complete sentence. On 3 L sats are hundred percent. Has some chronic smokers cough. Denies fever. No leg swelling. REVIEW OF SYSTEMS:   Review of Systems  Constitutional: Negative for chills, fever and weight loss.  HENT: Negative for ear discharge, ear pain and nosebleeds.   Eyes: Negative for blurred vision, pain and discharge.  Respiratory: Positive for cough and shortness of breath. Negative for sputum production, wheezing and stridor.   Cardiovascular: Positive for orthopnea. Negative for chest pain, palpitations and PND.  Gastrointestinal: Negative for abdominal pain, diarrhea, nausea and vomiting.  Genitourinary: Negative for frequency and urgency.  Musculoskeletal: Negative for back pain and joint pain.  Neurological: Positive for weakness. Negative for sensory change, speech change and focal weakness.  Psychiatric/Behavioral: Negative for depression and hallucinations. The patient is not nervous/anxious.    Tolerating Diet:yes Tolerating PT:   DRUG ALLERGIES:   Allergies  Allergen Reactions  . Alprazolam Swelling  . Fentanyl Other (See Comments)    "burning and hot"  . Meperidine Itching    Other reaction(s): Other (See Comments)  . Ranitidine Hcl     Other reaction(s): Other (See Comments)    VITALS:  Blood pressure 138/60, pulse 71, temperature 98.7 F (37.1 C), temperature source Axillary, resp. rate 16, height 5\' 1"  (1.549 m), weight 73.5 kg, SpO2 97 %.  PHYSICAL EXAMINATION:   Physical Exam  GENERAL:  74 y.o.-year-old patient lying in the bed with no acute distress.  EYES: Pupils equal, round, reactive to light and accommodation. No scleral icterus.   HEENT: Head atraumatic, normocephalic.  Oropharynx and nasopharynx clear.  NECK:  Supple, no jugular venous distention. No thyroid enlargement, no tenderness.  LUNGS:coarsel breath sounds bilaterally, no wheezing, rales, rhonchi. No use of accessory muscles of respiration.  CARDIOVASCULAR: S1, S2 normal. No murmurs, rubs, or gallops.  ABDOMEN: Soft, nontender, nondistended. Bowel sounds present. No organomegaly or mass.  EXTREMITIES: No cyanosis, clubbing or edema b/l.    NEUROLOGIC: Cranial nerves II through XII are intact. No focal Motor or sensory deficits b/l.   PSYCHIATRIC:  patient is alert and oriented x 3.  SKIN: No obvious rash, lesion, or ulcer.   LABORATORY PANEL:  CBC Recent Labs  Lab 07/15/19 0525  WBC 12.9*  HGB 10.0*  HCT 30.6*  PLT 298    Chemistries  Recent Labs  Lab 07/15/19 0055  NA 136  K 3.9  CL 106  CO2 20*  GLUCOSE 285*  BUN 24*  CREATININE 1.27*  CALCIUM 8.8*  AST 15  ALT 17  ALKPHOS 72  BILITOT 0.6   Cardiac Enzymes No results for input(s): TROPONINI in the last 168 hours. RADIOLOGY:  DG Chest Port 1 View  Result Date: 07/15/2019 CLINICAL DATA:  Respiratory distress, upper chest pain EXAM: PORTABLE CHEST 1 VIEW COMPARISON:  06/12/2019 FINDINGS: Single frontal view of the chest demonstrates stable dual lead pacemaker. Cardiac silhouette is enlarged. There is vascular congestion with diffuse interstitial and alveolar opacities consistent with edema. Small right pleural effusion. No pneumothorax. IMPRESSION: 1. Congestive heart failure. Electronically Signed   By: Randa Ngo M.D.   On: 07/15/2019 01:07   ECHOCARDIOGRAM COMPLETE  Result Date: 07/15/2019    ECHOCARDIOGRAM REPORT   Patient  Name:   Melanie King Date of Exam: 07/15/2019 Medical Rec #:  [id number]       Height:       61.0 in Accession #:    [id number]      Weight:       162.0 lb Date of Birth:  [birth year]       BSA:          1.727 m Patient Age:    73 years        BP:           135/59 mmHg Patient Gender: F                HR:           70 bpm. Exam Location:  [facility] Procedure: 2D Echo, Color Doppler and Cardiac Doppler Indications:     CHF- acute diastolic 428.31  History:         Patient has prior history of Echocardiogram examinations, most                  recent 03/26/2017. CHF, Pacemaker, COPD; Risk                  Factors:Hypertension and Diabetes.  Sonographer:     Cristela Blue RDCS (AE) Referring Phys:  [id number] Vernetta Honey MANSY Diagnosing Phys: Harold Hedge MD  Sonographer Comments: No apical window and no subcostal window. Image acquisition challenging due to COPD and Pt on Bipap. IMPRESSIONS  1. Left ventricular ejection fraction, by estimation, is 60 to 65%. The left ventricle has normal function. The left ventricle has no regional wall motion abnormalities. There is moderate left ventricular hypertrophy. Left ventricular diastolic parameters are consistent with Grade I diastolic dysfunction (impaired relaxation).  2. Right ventricular systolic function is normal. The right ventricular size is normal.  3. Left atrial size was mildly dilated.  4. Right atrial size was mildly dilated.  5. The mitral valve was not well visualized. Trivial mitral valve regurgitation.  6. The aortic valve was not well visualized. Aortic valve regurgitation is trivial. Mild aortic valve sclerosis is present, with no evidence of aortic valve stenosis. FINDINGS  Left Ventricle: Left ventricular ejection fraction, by estimation, is 60 to 65%. The left ventricle has normal function. The left ventricle has no regional wall motion abnormalities. The left ventricular internal cavity size was normal in size. There is  moderate left ventricular hypertrophy. Left ventricular diastolic parameters are consistent with Grade I diastolic dysfunction (impaired relaxation). Right Ventricle: The right ventricular size is normal. No increase in right ventricular wall thickness. Right ventricular systolic function is normal. Left Atrium: Left atrial size was mildly dilated.  Right Atrium: Right atrial size was mildly dilated. Pericardium: There is no evidence of pericardial effusion. Mitral Valve: The mitral valve was not well visualized. Trivial mitral valve regurgitation. Tricuspid Valve: The tricuspid valve is not well visualized. Tricuspid valve regurgitation is trivial. Aortic Valve: The aortic valve was not well visualized. Aortic valve regurgitation is trivial. Mild aortic valve sclerosis is present, with no evidence of aortic valve stenosis. Pulmonic Valve: The pulmonic valve was not well visualized. Pulmonic valve regurgitation is not visualized. Aorta: The aortic root was not well visualized. IAS/Shunts: The interatrial septum was not assessed.  LEFT VENTRICLE PLAX 2D LVIDd:         4.43 cm LVIDs:         2.93 cm LV PW:         1.05 cm  LV IVS:        1.30 cm LVOT diam:     2.00 cm LVOT Area:     3.14 cm  LEFT ATRIUM         Index LA diam:    3.80 cm 2.20 cm/m                        PULMONIC VALVE AORTA                 PV Vmax:        0.83 m/s Ao Root diam: 2.00 cm PV Peak grad:   2.8 mmHg                       RVOT Peak grad: 3 mmHg   SHUNTS Systemic Diam: 2.00 cm Harold Hedge MD Electronically signed by Harold Hedge MD Signature Date/Time: 07/15/2019/12:41:47 PM    Final    ASSESSMENT AND PLAN:  Ms. Koenig is a 74 year old female with a past medical history significant for coronary artery disease s/p PCI to the RCA and LAD, sick sinus syndrome s/p permanent pacemaker insertion, HFpEF, moderate to severe aortic stenosis, type 2 diabetes, COPD, hypothyroidism, hyperlipidemia, and hypertension who presented to the ED on 07/15/19 for a month long history of worsening shortness of breath, orthopnea, and PND.   1. Acute respiratory failure secondary to acute on chronic diastolic CHF as well as COPD exacerbation. -She will be continued on BiPAP overnight--now on 3l [place] and sats  100'% - continue steroid therapy with IV Solu-Medrol and duo nebs as scheduled on as needed  basis--prednisone. - IV Rocephin and Zithromax for severe COPD exacerbation.-- cont zithromax --pro calcitonin--<0.10--d/c IV rocephin - will be diuresed with IV Lasix   40 mg for acute CHF that could be related to cor pulmonale. -cardiology consultation  Appreciated with Dr Lady Gary  2.  Uncontrolled type 2 diabetes mellitus with hyperglycemia and peripheral neuropathy. - patient will be placed on supplemental coverage with NovoLog and we will continue his basal coverage. -Neurontin will be continued -SSI -unsure which insulin pt takes at home--I will start Lantus here  3.  Hypertension -continue her antihypertensives.  4.  Hypothyroidism. - continue Synthroid and check TSH level.  5.  Ongoing tobacco abuse. -The patient was counseled for smoking cessation and will receive further counseling here.  6.  Depression. -We will continue Celexa, Rexulti and Cogentin.  7.  DVT prophylaxis. -Subcutaneous Lovenox.   All the records are reviewed and case discussed with ED provider. The plan of care was discussed in details with the patient (and family). I answered all questions. The patient agreed to proceed with the above mentioned plan. Further management will depend upon hospital course.   CODE STATUS: Full code  Status is: Inpatient  Remains inpatient appropriate because:Ongoing diagnostic testing needed not appropriate for outpatient work up, Unsafe d/c plan, IV treatments appropriate due to intensity of illness or inability to take PO and Inpatient level of care appropriate due to severity of illness   Dispo: The patient is from: Home  Anticipated d/c is to: Home  Anticipated d/c date is: 2 days  Patient currently is not medically stable to d/c.   TOTAL TIME TAKING CARE OF THIS PATIENT: *35* minutes.  >50% time spent on counselling and coordination of care  Note: This dictation was prepared with Dragon dictation along with  smaller phrase technology. Any transcriptional errors that result from  this process are unintentional.  Enedina Finner M.D    [facility]   CC: Primary care physician; Marguarite Arbour, MDPatient ID: Melanie King, female   DOB: 1946/03/03, 74 y.o.   MRN: [id number]

## 2019-07-15 NOTE — ED Notes (Signed)
This RN at bedside to give pt medicine as ordered. Pt responsive to sternal rub but falls back asleep. Pt not arousable enough to tolerate PO meds at this time. Pt VSS. Pt remains on 2L [place] with oxygen sats 98%. MD made aware.

## 2019-07-15 NOTE — Consult Note (Signed)
CARDIOLOGY CONSULT NOTE               Patient ID: Melanie King MRN: [id number] DOB/AGE: 1945/12/29 74 y.o.  Admit date: 07/15/2019 Referring Physician Dr. Eugenie Norrie  Primary Physician Dr. Felipa Furnace  Primary Cardiologist Dr. Clayborn Bigness  Reason for Consultation Acute CHF  HPI: [name] is a 74 year old female with a past medical history significant for coronary artery disease s/p PCI to the RCA and LAD, sick sinus syndrome s/p permanent pacemaker insertion, HFpEF, moderate to severe aortic stenosis, type 2 diabetes, COPD, hypothyroidism, hyperlipidemia, and hypertension who presented to the ED on 07/15/19 for a month long history of worsening shortness of breath, orthopnea, and PND.  Workup in the ED was significant for chest xray revealing vascular congestion with diffuse interstitial and alveolar opacities, consistent with edema, creatinine of 1.27, BNP of 593, high sensitivity troponin borderline elevated at 54 and 48, and ECG revealing sinus rhythm with a RBBB.    She is followed in outpatient cardiology by Dr. Clayborn Bigness.  Pacemaker interrogation on 06/23/19 revealed an estimated longevity of 6.4 months Most recent echocardiogram on 03/29/17 revealed normal LV systolic function with an EF estimated between 60-65%, moderate to severe aortic stenosis.   Review of systems complete and found to be negative unless listed above     Past Medical History:  Diagnosis Date  . Arthritis   . CHF (congestive heart failure) ([place])   . COPD (chronic obstructive pulmonary disease) ([place])   . Coronary arteriosclerosis   . DDD (degenerative disc disease), cervical   . DDD (degenerative disc disease), lumbar   . Depression   . Diabetes mellitus without complication ([place])   . GERD (gastroesophageal reflux disease)   . Hyperlipidemia   . Hypertension   . Osteoarthritis   . Pacemaker   . Tremor     Past Surgical History:  Procedure Laterality Date  . ABDOMINAL HYSTERECTOMY    . BREAST  BIOPSY Right 1994   neg cyst removed  . CHOLECYSTECTOMY    . EYE SURGERY  1964, 1966, and 1967   bilateral  . FOOT SURGERY Right    cellulitis  . PACEMAKER INSERTION    . SPINE SURGERY     cyst removed; [facility]    (Not in a hospital admission)  Social History   Socioeconomic History  . Marital status: Single    Spouse name: Not on file  . Number of children: Not on file  . Years of education: Not on file  . Highest education level: Not on file  Occupational History  . Not on file  Tobacco Use  . Smoking status: Current Every Day Smoker    Packs/day: 1.00    Years: 50.00    Pack years: 50.00    Types: Cigarettes  . Smokeless tobacco: Never Used  Substance and Sexual Activity  . Alcohol use: No  . Drug use: No  . Sexual activity: Never  Other Topics Concern  . Not on file  Social History Narrative  . Not on file   Social Determinants of Health   Financial Resource Strain:   . Difficulty of Paying Living Expenses:   Food Insecurity:   . Worried About Charity fundraiser in the Last Year:   . Arboriculturist in the Last Year:   Transportation Needs:   . Film/video editor (Medical):   Marland Kitchen Lack of Transportation (Non-Medical):   Physical Activity:   . Days of Exercise  per Week:   . Minutes of Exercise per Session:   Stress:   . Feeling of Stress :   Social Connections:   . Frequency of Communication with Friends and Family:   . Frequency of Social Gatherings with Friends and Family:   . Attends Religious Services:   . Active Member of Clubs or Organizations:   . Attends Banker Meetings:   Marland Kitchen Marital Status:   Intimate Partner Violence:   . Fear of Current or Ex-Partner:   . Emotionally Abused:   Marland Kitchen Physically Abused:   . Sexually Abused:     Family History  Problem Relation Age of Onset  . Breast cancer Maternal Aunt 40  . Cancer Mother        colon  . Aneurysm Father       Review of systems complete and found to be negative  unless listed above      PHYSICAL EXAM  General: Well developed, well nourished, in mild respiratory distress HEENT:  Normocephalic and atramatic Neck:  No JVD.  Lungs: On BiPAP. Inspiratory wheezing and crackles noted in bilateral upper and lower lung fields  Heart: HRRR . Normal S1 and S2 without gallops or murmurs.  Abdomen: Bowel sounds are positive, abdomen soft and non-tender  Msk:  Back normal.  Normal strength and tone for age. Extremities: No clubbing, cyanosis or edema.   Neuro: Alert and oriented X 3. Psych:  Good affect, responds appropriately  Labs:   Lab Results  Component Value Date   WBC 12.9 (H) 07/15/2019   HGB 10.0 (L) 07/15/2019   HCT 30.6 (L) 07/15/2019   MCV 93.6 07/15/2019   PLT 298 07/15/2019    Recent Labs  Lab 07/15/19 0055  NA 136  K 3.9  CL 106  CO2 20*  BUN 24*  CREATININE 1.27*  CALCIUM 8.8*  PROT 7.0  BILITOT 0.6  ALKPHOS 72  ALT 17  AST 15  GLUCOSE 285*   Lab Results  Component Value Date   CKTOTAL 689 (H) 03/21/2019   TROPONINI <0.03 03/29/2017   No results found for: CHOL No results found for: HDL No results found for: LDLCALC No results found for: TRIG No results found for: CHOLHDL No results found for: LDLDIRECT    Radiology: DG Chest Port 1 View  Result Date: 07/15/2019 CLINICAL DATA:  Respiratory distress, upper chest pain EXAM: PORTABLE CHEST 1 VIEW COMPARISON:  06/12/2019 FINDINGS: Single frontal view of the chest demonstrates stable dual lead pacemaker. Cardiac silhouette is enlarged. There is vascular congestion with diffuse interstitial and alveolar opacities consistent with edema. Small right pleural effusion. No pneumothorax. IMPRESSION: 1. Congestive heart failure. Electronically Signed   By: Sharlet Salina M.D.   On: 07/15/2019 01:07    EKG: Normal sinus rhythm, RBBB. No evidence of acute ischemia   ASSESSMENT AND PLAN:  1.  Acute respiratory distress  -Currently on BiPAP; wean to [place] when appropriate    -Started on IV Solumedrol, Rocephin, and Zithromaz for COPD exacerbation   2.  Acute on chronic HFpEF  -Will continue to diuresis with close monitoring of renal function; agree with plan to increase home dose of 20mg  daily to 40mg  BID   -Echocardiogram ordered   3.  Moderate to severe AS  -Per echocardiogram in 2019  -Repeat echocardiogram ordered   4.  Elevated troponin   -Borderline elevated, but flat with no significant delta; no further ischemic workup indicated during this admission   5.  History of  sick sinus syndrome s/p pacemaker implantation   -6.4 months battery life; continue close f/u and monitoring in an outpatient setting   The history, physical exam findings, and plan of care were all discussed with Dr. Harold Hedge, and all decision making was made in collaboration.   Signed: Andi Hence PA-C 07/15/2019, 8:09 AM

## 2019-07-15 NOTE — H&P (Signed)
[place] at [facility]   PATIENT NAME: Melanie King    MR#:  [id number]  DATE OF BIRTH:  January 20, 1946  DATE OF ADMISSION:  07/15/2019  PRIMARY CARE PHYSICIAN: Marguarite Arbour, MD   REQUESTING/REFERRING PHYSICIAN: Chiquita Loth, MD  CHIEF COMPLAINT:   Chief Complaint  Patient presents with  . Respiratory Distress    HISTORY OF PRESENT ILLNESS:  Melanie King  is a 74 y.o. Caucasian female with a known history of CHF, COPD, coronary artery disease, type 2 diabetes mellitus, depression and GERD, who presented to the emergency room with acute onset of worsening dyspnea with associated dry cough and wheezing, orthopnea and paroxysmal nocturnal dyspnea worsening since yesterday.  She has been having intermittent symptoms for the last 40 days.  No fever or chills.  No nausea or vomiting.  She denies any lower extremity edema.  No headache or dizziness or blurred vision.  Upon presentation to the emergency room, blood pressure was 174/56 with respiratory 32 with otherwise normal vital signs.  Pulse oximetry was up to 95% on room BiPAP.  Labs revealed hyperglycemia of 285 and a BUN of 24 with creatinine 4.27 with BNP of 533 lactic acid was 1.5 with high-sensitivity troponin I of 54.  CBC showed mild leukocytosis of 12 with neutrophilia.  ABG was unremarkable.  COVID-19 PCR and influenza antigens came back negative.  UA came back negative.  Chest x-ray showed acute CHF. EKG showed normal sinus rhythm with a rate of 72 with right bundle branch block and left anterior fascicular block.  The patient was given 20 mg of IV Lasix.  She will be admitted to stepdown unit bed for further evaluation and management. PAST MEDICAL HISTORY:   Past Medical History:  Diagnosis Date  . Arthritis   . CHF (congestive heart failure) (HCC)   . COPD (chronic obstructive pulmonary disease) (HCC)   . Coronary arteriosclerosis   . DDD (degenerative disc disease), cervical   . DDD (degenerative disc  disease), lumbar   . Depression   . Diabetes mellitus without complication (HCC)   . GERD (gastroesophageal reflux disease)   . Hyperlipidemia   . Hypertension   . Osteoarthritis   . Pacemaker   . Tremor     PAST SURGICAL HISTORY:   Past Surgical History:  Procedure Laterality Date  . ABDOMINAL HYSTERECTOMY    . BREAST BIOPSY Right 1994   neg cyst removed  . CHOLECYSTECTOMY    . EYE SURGERY  1964, 1966, and 1967   bilateral  . FOOT SURGERY Right    cellulitis  . PACEMAKER INSERTION    . SPINE SURGERY     cyst removed; [facility]    SOCIAL HISTORY:   Social History   Tobacco Use  . Smoking status: Current Every Day Smoker    Packs/day: 1.00    Years: 50.00    Pack years: 50.00    Types: Cigarettes  . Smokeless tobacco: Never Used  Substance Use Topics  . Alcohol use: No    FAMILY HISTORY:   Family History  Problem Relation Age of Onset  . Breast cancer Maternal Aunt 40  . Cancer Mother        colon  . Aneurysm Father     DRUG ALLERGIES:   Allergies  Allergen Reactions  . Alprazolam Swelling  . Fentanyl Other (See Comments)    "burning and hot"  . Meperidine Itching    Other reaction(s): Other (See Comments)  . Ranitidine Hcl  Other reaction(s): Other (See Comments)    REVIEW OF SYSTEMS:   ROS As per history of present illness. All pertinent systems were reviewed above. Constitutional,  HEENT, cardiovascular, respiratory, GI, GU, musculoskeletal, neuro, psychiatric, endocrine,  integumentary and hematologic systems were reviewed and are otherwise  negative/unremarkable except for positive findings mentioned above in the HPI.   MEDICATIONS AT HOME:   Prior to Admission medications   Medication Sig Start Date End Date Taking? Authorizing Provider  amLODipine (NORVASC) 10 MG tablet TAKE 1 TABLET BY MOUTH EVERY DAY 08/28/16  Yes Gabriel Cirri, NP  aspirin EC 81 MG tablet Take 81 mg by mouth daily.   Yes [provider]   benztropine (COGENTIN) 0.5 MG tablet Take 0.5 mg by mouth 2 (two) times daily.   Yes [provider]  brexpiprazole (REXULTI) 1 MG TABS tablet Take 1 mg by mouth at bedtime.   Yes [provider]  calcitRIOL (ROCALTROL) 0.25 MCG capsule Take 0.25 mcg by mouth daily.   Yes [provider]  cholecalciferol (VITAMIN D3) 25 MCG (1000 UNIT) tablet Take 1,000 Units by mouth daily.   Yes [provider]  citalopram (CELEXA) 20 MG tablet Take 20 mg by mouth daily.   Yes [provider]  docusate sodium (COLACE) 100 MG capsule Take 100 mg by mouth 2 (two) times daily.   Yes [provider]  famotidine (PEPCID) 40 MG tablet Take 40 mg by mouth daily.   Yes [provider]  fenofibrate (TRICOR) 48 MG tablet Take 48 mg by mouth daily.   Yes [provider]  fluticasone (FLONASE) 50 MCG/ACT nasal spray Place 2 sprays into both nostrils daily.   Yes [provider]  furosemide (LASIX) 20 MG tablet Take 20 mg by mouth daily.   Yes [provider]  gabapentin (NEURONTIN) 100 MG capsule Take 100-300 mg by mouth See admin instructions. Take 1 capsule (100mg ) by mouth every morning and daily at noon and take 3 capsules (300mg ) by mouth every night   Yes [provider]  insulin aspart protamine- aspart (NOVOLOG MIX 70/30) (70-30) 100 UNIT/ML injection Inject into the skin 2 (two) times daily with a meal.   Yes [provider]  LANTUS SOLOSTAR 100 UNIT/ML Solostar Pen Inject 24 Units into the skin at bedtime.  03/19/19  Yes [provider]  levothyroxine (SYNTHROID) 125 MCG tablet Take 1 tablet (125 mcg total) by mouth daily before breakfast. 05/28/19  Yes Dhungel, Nishant, MD  losartan (COZAAR) 50 MG tablet Take 50 mg by mouth daily.   Yes [provider]  magnesium oxide (MAG-OX) 400 MG tablet Take 400 mg by mouth daily.   Yes [provider]  meloxicam (MOBIC) 15 MG tablet Take 15 mg  by mouth daily. 05/30/19  Yes [provider]  metFORMIN (GLUCOPHAGE) 1000 MG tablet Take 0.5 tablets (500 mg total) by mouth 2 (two) times daily with a meal. 05/28/19  Yes Dhungel, Nishant, MD  metoprolol tartrate (LOPRESSOR) 25 MG tablet Take 25 mg by mouth 2 (two) times daily.   Yes [provider]  montelukast (SINGULAIR) 10 MG tablet Take 10 mg by mouth at bedtime.   Yes [provider]  nitroGLYCERIN (NITRODUR - DOSED IN MG/24 HR) 0.2 mg/hr patch Place 0.2 mg onto the skin daily. leave patch on 12-14 hours, then remove for 10-12 hours prior to applying the next patch   Yes [provider]  primidone (MYSOLINE) 50 MG tablet Take 50 mg  by mouth 2 (two) times daily.   Yes [provider]  risperiDONE (RISPERDAL M-TABS) 0.5 MG disintegrating tablet Take 0.5 mg by mouth 2 (two) times daily.   Yes [provider]  rosuvastatin (CRESTOR) 40 MG tablet Take 40 mg by mouth at bedtime.   Yes [provider]  sitaGLIPtin (JANUVIA) 50 MG tablet Take 50 mg by mouth daily.   Yes [provider]  traMADol (ULTRAM) 50 MG tablet Take 1 tablet (50 mg total) by mouth every 12 (twelve) hours as needed for moderate pain. 05/28/19  Yes Dhungel, Nishant, MD  valsartan (DIOVAN) 160 MG tablet Take 160 mg by mouth daily.   Yes [provider]  vitamin B-12 (CYANOCOBALAMIN) 1000 MCG tablet Take 1,000 mcg by mouth daily.   Yes [provider]  nicotine (NICODERM CQ - DOSED IN MG/24 HOURS) 21 mg/24hr patch Place 1 patch (21 mg total) onto the skin daily. Patient not taking: Reported on 07/15/2019 03/13/19   Dhungel, Flonnie Overman, MD      VITAL SIGNS:  Blood pressure (!) 174/56, pulse 74, temperature 98.7 F (37.1 C), temperature source Axillary, resp. rate (!) 33, height 5\' 1"  (1.549 m), weight 73.5 kg, SpO2 97 %.  PHYSICAL EXAMINATION:  Physical Exam  GENERAL:  74 y.o.-year-old Caucasian female patient lying in the bed with no acute  distress.  EYES: Pupils equal, round, reactive to light and accommodation. No scleral icterus. Extraocular muscles intact.  HEENT: Head atraumatic, normocephalic. Oropharynx and nasopharynx clear.  NECK:  Supple, no jugular venous distention. No thyroid enlargement, no tenderness.  LUNGS: Diminished bibasal breath sounds with bibasal rales and diffuse expiratory wheezes with tight expiratory airflow and harsh vesicular breathing.  CARDIOVASCULAR: Regular rate and rhythm, S1, S2 normal. No murmurs, rubs, or gallops.  ABDOMEN: Soft, nondistended, nontender. Bowel sounds present. No organomegaly or mass.  EXTREMITIES: No pedal edema, cyanosis, or clubbing.  NEUROLOGIC: Cranial nerves II through XII are intact. Muscle strength 5/5 in all extremities. Sensation intact. Gait not checked.  PSYCHIATRIC: The patient is alert and oriented x 3.  Normal affect and good eye contact. SKIN: No obvious rash, lesion, or ulcer.   LABORATORY PANEL:   CBC Recent Labs  Lab 07/15/19 0055  WBC 12.0*  HGB 9.9*  HCT 30.1*  PLT 319   ------------------------------------------------------------------------------------------------------------------  Chemistries  Recent Labs  Lab 07/15/19 0055  NA 136  K 3.9  CL 106  CO2 20*  GLUCOSE 285*  BUN 24*  CREATININE 1.27*  CALCIUM 8.8*  AST 15  ALT 17  ALKPHOS 72  BILITOT 0.6   ------------------------------------------------------------------------------------------------------------------  Cardiac Enzymes No results for input(s): TROPONINI in the last 168 hours. ------------------------------------------------------------------------------------------------------------------  RADIOLOGY:  DG Chest Port 1 View  Result Date: 07/15/2019 CLINICAL DATA:  Respiratory distress, upper chest pain EXAM: PORTABLE CHEST 1 VIEW COMPARISON:  06/12/2019 FINDINGS: Single frontal view of the chest demonstrates stable dual lead pacemaker. Cardiac silhouette is  enlarged. There is vascular congestion with diffuse interstitial and alveolar opacities consistent with edema. Small right pleural effusion. No pneumothorax. IMPRESSION: 1. Congestive heart failure. Electronically Signed   By: Randa Ngo M.D.   On: 07/15/2019 01:07      IMPRESSION AND PLAN:   1.  Acute respiratory failure secondary to acute on chronic diastolic CHF as well as COPD exacerbation. -The patient will be admitted to a stepdown unit bed. -She will be continued on BiPAP overnight. - Will continue steroid therapy with IV Solu-Medrol and duo nebs as scheduled on  as needed basis. -We will add IV Rocephin and Zithromax for severe COPD exacerbation. -She will be diuresed with IV Lasix for acute CHF that could be related to cor pulmonale. -2D echo and cardiology consultation will be obtained. -I notified Dr. Lady Gary about the patient.  2.  Uncontrolled type 2 diabetes mellitus with hyperglycemia and peripheral neuropathy. -The patient will be placed on supplemental coverage with NovoLog and we will continue his basal coverage. -Neurontin will be continued  3.  Hypertension -We will continue her antihypertensives.  4.  Hypothyroidism. -We will continue Synthroid and check TSH level.  5.  Ongoing tobacco abuse. -The patient was counseled for smoking cessation and will receive further counseling here.  6.  Depression. -We will continue Celexa, Rexulti and Cogentin.  7.  DVT prophylaxis. -Subcutaneous Lovenox.   All the records are reviewed and case discussed with ED provider. The plan of care was discussed in details with the patient (and family). I answered all questions. The patient agreed to proceed with the above mentioned plan. Further management will depend upon hospital course.   CODE STATUS: Full code  Status is: Inpatient  Remains inpatient appropriate because:Ongoing diagnostic testing needed not appropriate for outpatient work up, Unsafe d/c plan, IV  treatments appropriate due to intensity of illness or inability to take PO and Inpatient level of care appropriate due to severity of illness   Dispo: The patient is from: Home              Anticipated d/c is to: Home              Anticipated d/c date is: 3 days              Patient currently is not medically stable to d/c.    TOTAL TIME TAKING CARE OF THIS PATIENT: 55 minutes.    Hannah Beat M.D on 07/15/2019 at 3:00 AM  [facility]   From 7 PM-7 AM, contact night-coverage [email or web address]  CC: Primary care physician; Marguarite Arbour, MD   Note: This dictation was prepared with Dragon dictation along with smaller phrase technology. Any transcriptional errors that result from this process are unintentional.

## 2019-07-15 NOTE — ED Notes (Signed)
Pt husband took hearing aids out and placed them in his pocket to take home.

## 2019-07-15 NOTE — ED Notes (Signed)
Admitting MD at bedside.

## 2019-07-15 NOTE — ED Notes (Addendum)
Pt not on BiPAP. Pt remains on 3L [place]. Previously showing BiPAP at 1700 which is incorrect

## 2019-07-15 NOTE — TOC Initial Note (Signed)
[facility]) - Initial/Assessment Note    Patient Details  Name: Melanie King MRN: [id number] Date of Birth: 1945/09/14  [facility]) CM/SW Contact:    [place] Cellar, RN Phone Number: 07/15/2019, 11:00 AM  Clinical Narrative:                 Patient currently with PT and awaiting OT consult. Attempted to contact emergency contact for baseline information but no answer and no option to leave baseline.         Patient Goals and CMS Choice        Expected Discharge Plan and Services                                                Prior Living Arrangements/Services                       Activities of Daily Living      Permission Sought/Granted                  Emotional Assessment              Admission diagnosis:  Acute respiratory failure (HCC) [J96.00] Patient Active Problem List   Diagnosis Date Noted  . Acute respiratory failure (HCC) 07/15/2019  . Frequent falls 05/26/2019  . Generalized weakness 05/26/2019  . AKI (acute kidney injury) (HCC) 03/21/2019  . Subdural hematoma (HCC) 03/20/2019  . Hypothyroidism 03/10/2019  . Depression   . Chronic diastolic CHF (congestive heart failure) (HCC)   . Acute metabolic encephalopathy   . Fall   . Tobacco abuse   . C6 cervical fracture (HCC)   . Acute delirium 05/12/2017  . Seizure (HCC) 05/10/2017  . Elevated sedimentation rate 04/04/2017  . Elevated C-reactive protein (CRP) 04/04/2017  . Chest pain, unspecified 04/02/2017  . Bilateral lower extremity pain (Secondary Area of Pain) (R>L) 04/02/2017  . Chronic hip pain, bilateral  (Secondary Area of Pain) (R>L) 04/02/2017  . Chronic bilateral low back pain with bilateral sciatica [facility] Area of Pain) (R>L) 04/02/2017  . Chronic pain syndrome 04/02/2017  . Disorder of bone, unspecified 04/02/2017  . Other long term (current) drug therapy 04/02/2017  . Other specified health status 04/02/2017  . Long  term current use of opiate analgesic 04/02/2017  . Hip pain, bilateral 12/06/2016  . Tremor 05/27/2016  . Trigger finger of left hand 02/16/2016  . Osteoarthritis of both hands 02/16/2016  . Angina pectoris (HCC) 01/17/2016  . COPD (chronic obstructive pulmonary disease) (HCC) 01/17/2016  . CAD (coronary artery disease) 01/17/2016  . HLD (hyperlipidemia) 01/17/2016  . Hypertension 01/17/2016  . Sick sinus syndrome (HCC) 01/17/2016  . Chronic pain 01/17/2016  . Personal history of tobacco use, presenting hazards to health 11/16/2015  . Type II diabetes mellitus with renal manifestations (HCC) 05/12/2013   PCP:  Marguarite Arbour, MD Pharmacy:   [facility] [place], [place] - [address] [place] [address] Phone: [phone] Fax: [phone]  [id number] [place], [place] - [address] [place] [address] Phone: [phone] Fax: [phone]  [facility] [place], [place] - [address] [place] [address] Phone: [phone] Fax: [phone]     Social Determinants  of Health (SDOH) Interventions    Readmission Risk Interventions No flowsheet data found.  

## 2019-07-16 DIAGNOSIS — J441 Chronic obstructive pulmonary disease with (acute) exacerbation: Secondary | ICD-10-CM

## 2019-07-16 DIAGNOSIS — J9600 Acute respiratory failure, unspecified whether with hypoxia or hypercapnia: Secondary | ICD-10-CM | POA: Diagnosis present

## 2019-07-16 DIAGNOSIS — U071 COVID-19: Secondary | ICD-10-CM | POA: Diagnosis present

## 2019-07-16 DIAGNOSIS — R0603 Acute respiratory distress: Secondary | ICD-10-CM

## 2019-07-16 DIAGNOSIS — I509 Heart failure, unspecified: Secondary | ICD-10-CM

## 2019-07-16 LAB — CBC WITH DIFFERENTIAL/PLATELET
Abs Immature Granulocytes: 0.05 10*3/uL (ref 0.00–0.07)
Basophils Absolute: 0 10*3/uL (ref 0.0–0.1)
Basophils Relative: 0 %
Eosinophils Absolute: 0 10*3/uL (ref 0.0–0.5)
Eosinophils Relative: 0 %
HCT: 28.8 % — ABNORMAL LOW (ref 36.0–46.0)
Hemoglobin: 9.6 g/dL — ABNORMAL LOW (ref 12.0–15.0)
Immature Granulocytes: 0 %
Lymphocytes Relative: 12 %
Lymphs Abs: 1.8 10*3/uL (ref 0.7–4.0)
MCH: 31.2 pg (ref 26.0–34.0)
MCHC: 33.3 g/dL (ref 30.0–36.0)
MCV: 93.5 fL (ref 80.0–100.0)
Monocytes Absolute: 0.7 10*3/uL (ref 0.1–1.0)
Monocytes Relative: 4 %
Neutro Abs: 12.5 10*3/uL — ABNORMAL HIGH (ref 1.7–7.7)
Neutrophils Relative %: 84 %
Platelets: 305 10*3/uL (ref 150–400)
RBC: 3.08 MIL/uL — ABNORMAL LOW (ref 3.87–5.11)
RDW: 15.2 % (ref 11.5–15.5)
WBC: 15 10*3/uL — ABNORMAL HIGH (ref 4.0–10.5)
nRBC: 0 % (ref 0.0–0.2)

## 2019-07-16 LAB — BASIC METABOLIC PANEL
Anion gap: 11 (ref 5–15)
BUN: 34 mg/dL — ABNORMAL HIGH (ref 8–23)
CO2: 24 mmol/L (ref 22–32)
Calcium: 9 mg/dL (ref 8.9–10.3)
Chloride: 101 mmol/L (ref 98–111)
Creatinine, Ser: 1.22 mg/dL — ABNORMAL HIGH (ref 0.44–1.00)
GFR calc Af Amer: 51 mL/min — ABNORMAL LOW (ref >60)
GFR calc non Af Amer: 44 mL/min — ABNORMAL LOW (ref >60)
Glucose, Bld: 265 mg/dL — ABNORMAL HIGH (ref 70–99)
Potassium: 3.3 mmol/L — ABNORMAL LOW (ref 3.5–5.1)
Sodium: 136 mmol/L (ref 135–145)

## 2019-07-16 LAB — HIV ANTIBODY (ROUTINE TESTING W REFLEX): HIV Screen 4th Generation wRfx: NONREACTIVE

## 2019-07-16 LAB — GLUCOSE, CAPILLARY
Glucose-Capillary: 246 mg/dL — ABNORMAL HIGH (ref 70–99)
Glucose-Capillary: 251 mg/dL — ABNORMAL HIGH (ref 70–99)
Glucose-Capillary: 364 mg/dL — ABNORMAL HIGH (ref 70–99)
Glucose-Capillary: 297 mg/dL — ABNORMAL HIGH (ref 70–99)

## 2019-07-16 MED ORDER — POTASSIUM CHLORIDE CRYS ER 20 MEQ PO TBCR
40.0000 meq | EXTENDED_RELEASE_TABLET | Freq: Once | ORAL | Status: AC
  Administered 2019-07-16: 40 meq via ORAL
  Filled 2019-07-16: qty 2

## 2019-07-16 MED ORDER — AZITHROMYCIN 500 MG PO TABS
500.0000 mg | ORAL_TABLET | Freq: Every day | ORAL | Status: AC
  Administered 2019-07-16 – 2019-07-17 (×2): 500 mg via ORAL
  Filled 2019-07-16 (×2): qty 1

## 2019-07-16 MED ORDER — ENSURE MAX PROTEIN PO LIQD
11.0000 [oz_av] | Freq: Two times a day (BID) | ORAL | Status: DC
  Administered 2019-07-16 – 2019-07-20 (×7): 11 [oz_av] via ORAL
  Filled 2019-07-16: qty 330

## 2019-07-16 MED ORDER — PREDNISONE 20 MG PO TABS
40.0000 mg | ORAL_TABLET | Freq: Every day | ORAL | Status: DC
  Administered 2019-07-17: 10:00:00 40 mg via ORAL
  Filled 2019-07-16: qty 2

## 2019-07-16 MED ORDER — SODIUM CHLORIDE 0.9% FLUSH: 10.0000 mL | INTRAVENOUS | Status: DC | PRN

## 2019-07-16 MED ORDER — HYDROXYZINE HCL 25 MG PO TABS
25.0000 mg | ORAL_TABLET | Freq: Two times a day (BID) | ORAL | Status: DC | PRN
  Administered 2019-07-18: 25 mg via ORAL
  Filled 2019-07-16 (×2): qty 1

## 2019-07-16 NOTE — ED Notes (Signed)
Pt has since calmed down but does appear to have some memory loss and confusion. Similar sx were noted in a previous MD visit on 06/19/19  Will continue to monitor

## 2019-07-16 NOTE — TOC Initial Note (Signed)
Transition of Care [facility]) - Initial/Assessment Note    Patient Details  Name: Melanie King MRN: [id number] Date of Birth: [birth year]  Transition of Care [facility]) CM/SW Contact:    Elease Hashimoto, LCSW Phone Number: 07/16/2019, 11:01 AM  Clinical Narrative:  Met with pt and friend_Roger to obtain information she lives with him and was independent prior to admission. Friend feels she needs to go to a ALF and pt wants to get her surgeries done before she goes to one, does want a list of them while here. She has no equipment at home and does not require O2-has it on now. Both are very distracted and difficult to stay on task-conversation. Friend is on board for her to go back home with him then pursue ALF. Both report [facility] has just started to follow at home, she feels she is weak and needs to get stronger. Will follow and see if any discharge needs. She does not have O2 at home and is currently on it. Will give ALF list and work on discharge needs.                 Expected Discharge Plan: [place] Barriers to Discharge: Continued Medical Work up   Patient Goals and CMS Choice Patient states their goals for this hospitalization and ongoing recovery are:: I plan to go home and once I have my surgeries I plan to go to Assisted Living facility CMS Medicare.gov Compare Post Acute Care list provided to:: Patient Choice offered to / list presented to : Patient  Expected Discharge Plan and Services Expected Discharge Plan: [place] In-house Referral: Clinical Social Work   Post Acute Care Choice: Home Health, Durable Medical Equipment                                        Prior Living Arrangements/Services   Lives with:: Friends(Roger) Patient language and need for interpreter reviewed:: No Do you feel safe going back to the place where you live?: Yes      Need for Family Participation in Patient Care: Yes (Comment) Care giver support system  in place?: Yes (comment)   Criminal Activity/Legal Involvement Pertinent to Current Situation/Hospitalization: No - Comment as needed  Activities of Daily Living      Permission Sought/Granted Permission sought to share information with : Other (comment)(Roger) Permission granted to share information with : Yes, Verbal Permission Granted  Share Information with NAME: [name]     Permission granted to share info w Relationship: Friend     Emotional Assessment Appearance:: Appears stated age Attitude/Demeanor/Rapport: Gracious Affect (typically observed): Adaptable Orientation: : Oriented to Self, Oriented to Place, Oriented to  Time, Oriented to Situation Alcohol / Substance Use: Tobacco Use Psych Involvement: No (comment)  Admission diagnosis:  Acute respiratory failure (HCC) [J96.00] Respiratory distress [R06.03] Hypoxia [R09.02] Chronic obstructive pulmonary disease with acute exacerbation (HCC) [J44.1] Acute on chronic congestive heart failure, unspecified heart failure type ([place]) [I50.9] Acute respiratory failure due to COVID-19 ([place]) [U07.1, J96.00] Patient Active Problem List   Diagnosis Date Noted  . Acute respiratory failure due to COVID-19 ([place]) 07/16/2019  . Acute respiratory failure ([place]) 07/15/2019  . Frequent falls 05/26/2019  . Generalized weakness 05/26/2019  . AKI (acute kidney injury) ([place]) 03/21/2019  . Subdural hematoma ([place]) 03/20/2019  . Hypothyroidism 03/10/2019  . Depression   . Chronic  diastolic CHF (congestive heart failure) ([place])   . Acute metabolic encephalopathy   . Fall   . Tobacco abuse   . C6 cervical fracture ([place])   . Acute delirium 05/12/2017  . Seizure ([place]) 05/10/2017  . Elevated sedimentation rate 04/04/2017  . Elevated C-reactive protein (CRP) 04/04/2017  . Chest pain, unspecified 04/02/2017  . Bilateral lower extremity pain (Secondary Area of Pain) (R>L) 04/02/2017  . Chronic hip pain, bilateral  (Secondary Area of Pain) (R>L)  04/02/2017  . Chronic bilateral low back pain with bilateral sciatica [facility] Area of Pain) (R>L) 04/02/2017  . Chronic pain syndrome 04/02/2017  . Disorder of bone, unspecified 04/02/2017  . Other long term (current) drug therapy 04/02/2017  . Other specified health status 04/02/2017  . Long term current use of opiate analgesic 04/02/2017  . Hip pain, bilateral 12/06/2016  . Tremor 05/27/2016  . Trigger finger of left hand 02/16/2016  . Osteoarthritis of both hands 02/16/2016  . Angina pectoris ([place]) 01/17/2016  . COPD (chronic obstructive pulmonary disease) ([place]) 01/17/2016  . CAD (coronary artery disease) 01/17/2016  . HLD (hyperlipidemia) 01/17/2016  . Hypertension 01/17/2016  . Sick sinus syndrome ([place]) 01/17/2016  . Chronic pain 01/17/2016  . Personal history of tobacco use, presenting hazards to health 11/16/2015  . Type II diabetes mellitus with renal manifestations ([place]) 05/12/2013   PCP:  Idelle Crouch, MD Pharmacy:   [facility] [place], [place] - [address] [place] [address] Phone: [phone] Fax: [phone]  [id number] [place] [place] [address]: [phone]: [address]Phone: [phone]: [phone]    Social Determinants of Health (SDOH) Interventions    Readmission Risk Interventions No flowsheet data found.

## 2019-07-16 NOTE — Progress Notes (Signed)
Initial Nutrition Assessment  DOCUMENTATION CODES:   Not applicable  INTERVENTION:  Provide Ensure Max Protein po BID, each supplement provides 150 kcal and 30 grams of protein.  Provided patient with recipe for homemade oral nutrition supplement as she stated she cannot afford to purchase them for home.  RD provided "Heart Failure Nutrition Therapy" handout from the [employer]. Reviewed patient's dietary recall. Provided examples on ways to decrease sodium intake in diet. Discouraged intake of processed foods and use of salt shaker. Encouraged fresh fruits and vegetables as well as whole grain sources of carbohydrates to maximize fiber intake. RD discussed why it is important for patient to adhere to diet recommendations, and emphasized the role of fluids, foods to avoid, and importance of weighing self daily. Teach back method used.  NUTRITION DIAGNOSIS:   Increased nutrient needs related to catabolic illness(COPD, CHF) as evidenced by estimated needs.  GOAL:   Patient will meet greater than or equal to 90% of their needs  MONITOR:   PO intake, Supplement acceptance, Labs, Weight trends, I & O's  REASON FOR ASSESSMENT:   Consult Assessment of nutrition requirement/status  ASSESSMENT:   74 year old female with PMHx of DM, HTN, depression, GERD, arthritis, HLD, CHF, COPD, CAD s/p PCI, sick sinus syndrome s/p permanent pacemaker insertion, hypothyroidism admitted with acute exacerbation of COPD and acute on chronic diastolic CHF.   Met with patient at bedside. Patient is very hard of hearing. RD had to sit on patient's right side as she can hear better from her right ear. Patient reports she has had a decreased appetite for the past 3-6 months. She has been forcing herself to eat because she knows it is important but reports she has still been eating less than usual. She also reports difficulty with affording food, which limits her intake. If she prepares food  at home she typically has vegetables with chicken or another meat. She reports they have been going out to eat more often lately and she may get a grilled chicken salad. She used to follow low-sodium diet but reports she has not been able to follow it as closely lately. RD provided handout to patient per her request. Reviewed some of the higher sodium foods patient is eating such as canned vegetables and reviewed some lower sodium alternatives. Patient reports she does not currently weigh herself at home and is only weighed at doctor appointments. Discussed importance of monitoring weight daily at home and how it can help identify fluid retention. Patient reports at this time she is not interested in starting to weigh herself daily at home. Patient is amenable to drinking Ensure Max Protein to help meet nutrient needs. She reports she cannot afford oral nutrition supplements at home. Provided patient with a recipe for homemade oral nutrition supplement at home.  Patient reports she is unsure if she has lost any weight. Her UBW is 152 lbs. Current weight is documented to be 73.5 kg (162 lbs). Unsure if this was a true measured weight. Attempted to weigh patient on bed scale but it was not zeroed correctly and patient was measuring at around 136 lbs, which she reports cannot be accurate.   Medications reviewed and include: azithromycin, vitamin D3 1000 units daily, Colace 100 mg BID, famotidine, fenofibrate 54 daily, Lasix 40 mg Q12hrs IV, gabapentin, Novolog 0-9 units TID, Novolog 0-5 units QHS, levothyroxine, magnesium oxide 400 mg daily, nicotine patch, prednisone 50 mg daily, vitamin B12 1000 micrograms daily.  Labs reviewed:  CBG 246-473, Potassium 3.3, BUN 34, Creatinine 1.22.  Patient does not meet criteria for malnutrition at this time but is at risk for malnutrition.  NUTRITION - FOCUSED PHYSICAL EXAM:    Most Recent Value  Orbital Region  No depletion  Upper Arm Region  Mild depletion  Thoracic  and Lumbar Region  No depletion  Buccal Region  No depletion  Temple Region  Mild depletion  Clavicle Bone Region  No depletion  Clavicle and Acromion Bone Region  Mild depletion  Scapular Bone Region  No depletion  Dorsal Hand  Mild depletion  Patellar Region  Mild depletion  Anterior Thigh Region  Mild depletion  Posterior Calf Region  Mild depletion  Edema (RD Assessment)  None  Hair  Reviewed  Eyes  Reviewed  Mouth  Reviewed  Skin  Reviewed  Nails  Reviewed     Diet Order:   Diet Order            Diet Heart Room service appropriate? Yes; Fluid consistency: Thin  Diet effective now             EDUCATION NEEDS:   Education needs have been addressed  Skin:  Skin Assessment: (RN skin assessment not yet completed as pt was just admitted)  Last BM:  Unknown  Height:   Ht Readings from Last 1 Encounters:  07/15/19 5' 1" (1.549 m)   Weight:   Wt Readings from Last 1 Encounters:  07/15/19 73.5 kg   BMI:  Body mass index is 30.61 kg/m.  Estimated Nutritional Needs:   Kcal:  1700-1900  Protein:  90-100 grams  Fluid:  1.5-1.8 L/day  Jacklynn Barnacle, MS, RD, LDN Pager number available on Amion

## 2019-07-16 NOTE — Progress Notes (Signed)
[facility]  - [place] [facility]   PATIENT NAME: Melanie King    MR#:  [id number]  DATE OF BIRTH:  04-19-1945  SUBJECTIVE:  patient came in with increasing shortness of breath a few days. She was on BiPAP. Currently breathing comfortably able to complete sentence. On 3 L sats are hundred percent. Has some chronic smokers cough. Denies fever. No leg swelling. Good urine output overnight. Patient feels a lot better. REVIEW OF SYSTEMS:   Review of Systems  Constitutional: Negative for chills, fever and weight loss.  HENT: Negative for ear discharge, ear pain and nosebleeds.   Eyes: Negative for blurred vision, pain and discharge.  Respiratory: Positive for cough and shortness of breath. Negative for sputum production, wheezing and stridor.   Cardiovascular: Positive for orthopnea. Negative for chest pain, palpitations and PND.  Gastrointestinal: Negative for abdominal pain, diarrhea, nausea and vomiting.  Genitourinary: Negative for frequency and urgency.  Musculoskeletal: Negative for back pain and joint pain.  Neurological: Positive for weakness. Negative for sensory change, speech change and focal weakness.  Psychiatric/Behavioral: Negative for depression and hallucinations. The patient is not nervous/anxious.    Tolerating Diet:yes Tolerating PT: rehab  DRUG ALLERGIES:   Allergies  Allergen Reactions  . Alprazolam Swelling  . Fentanyl Other (See Comments)    "burning and hot"  . Meperidine Itching    Other reaction(s): Other (See Comments)  . Ranitidine Hcl     Other reaction(s): Other (See Comments)    VITALS:  Blood pressure (!) 172/70, pulse 70, temperature 98.6 F (37 C), temperature source Oral, resp. rate 18, height 5\' 1"  (1.549 m), weight 73.5 kg, SpO2 98 %.  PHYSICAL EXAMINATION:   Physical Exam  GENERAL:  74 y.o.-year-old patient lying in the bed with no acute distress.  EYES: Pupils equal, round, reactive to light and accommodation. No  scleral icterus.   HEENT: Head atraumatic, normocephalic. Oropharynx and nasopharynx clear.  NECK:  Supple, no jugular venous distention. No thyroid enlargement, no tenderness.  LUNGS:coarsel breath sounds bilaterally, no wheezing, rales, rhonchi. No use of accessory muscles of respiration.  CARDIOVASCULAR: S1, S2 normal. No murmurs, rubs, or gallops.  ABDOMEN: Soft, nontender, nondistended. Bowel sounds present. No organomegaly or mass.  EXTREMITIES: No cyanosis, clubbing or edema b/l.    NEUROLOGIC: Cranial nerves II through XII are intact. No focal Motor or sensory deficits b/l.   PSYCHIATRIC:  patient is alert and oriented x 3.  SKIN: No obvious rash, lesion, or ulcer.   LABORATORY PANEL:  CBC Recent Labs  Lab 07/16/19 0855  WBC 15.0*  HGB 9.6*  HCT 28.8*  PLT 305    Chemistries  Recent Labs  Lab 07/15/19 0055 07/15/19 0055 07/16/19 0855  NA 136   < > 136  K 3.9   < > 3.3*  CL 106   < > 101  CO2 20*   < > 24  GLUCOSE 285*   < > 265*  BUN 24*   < > 34*  CREATININE 1.27*   < > 1.22*  CALCIUM 8.8*   < > 9.0  AST 15  --   --   ALT 17  --   --   ALKPHOS 72  --   --   BILITOT 0.6  --   --    < > = values in this interval not displayed.   Cardiac Enzymes No results for input(s): TROPONINI in the last 168 hours. RADIOLOGY:  DG Chest Port 1 [address]  Result Date: 07/15/2019 CLINICAL DATA:  Respiratory distress, upper chest pain EXAM: PORTABLE CHEST 1 VIEW COMPARISON:  06/12/2019 FINDINGS: Single frontal view of the chest demonstrates stable dual lead pacemaker. Cardiac silhouette is enlarged. There is vascular congestion with diffuse interstitial and alveolar opacities consistent with edema. Small right pleural effusion. No pneumothorax. IMPRESSION: 1. Congestive heart failure. Electronically Signed   By: Sharlet Salina M.D.   On: 07/15/2019 01:07   ECHOCARDIOGRAM COMPLETE  Result Date: 07/15/2019    ECHOCARDIOGRAM REPORT   Patient Name:   Melanie F Maranto Date of Exam:  07/15/2019 Medical Rec #:  [id number]       Height:       61.0 in Accession #:    [id number]      Weight:       162.0 lb Date of Birth:  1945-04-24       BSA:          1.727 m Patient Age:    74 years        BP:           135/59 mmHg Patient Gender: F               HR:           70 bpm. Exam Location:  [facility] Procedure: 2D Echo, Color Doppler and Cardiac Doppler Indications:     CHF- acute diastolic 428.31  History:         Patient has prior history of Echocardiogram examinations, most                  recent 03/26/2017. CHF, Pacemaker, COPD; Risk                  Factors:Hypertension and Diabetes.  Sonographer:     Cristela Blue RDCS (AE) Referring Phys:  [id number] Vernetta Honey MANSY Diagnosing Phys: Harold Hedge MD  Sonographer Comments: No apical window and no subcostal window. Image acquisition challenging due to COPD and Pt on Bipap. IMPRESSIONS  1. Left ventricular ejection fraction, by estimation, is 60 to 65%. The left ventricle has normal function. The left ventricle has no regional wall motion abnormalities. There is moderate left ventricular hypertrophy. Left ventricular diastolic parameters are consistent with Grade I diastolic dysfunction (impaired relaxation).  2. Right ventricular systolic function is normal. The right ventricular size is normal.  3. Left atrial size was mildly dilated.  4. Right atrial size was mildly dilated.  5. The mitral valve was not well visualized. Trivial mitral valve regurgitation.  6. The aortic valve was not well visualized. Aortic valve regurgitation is trivial. Mild aortic valve sclerosis is present, with no evidence of aortic valve stenosis. FINDINGS  Left Ventricle: Left ventricular ejection fraction, by estimation, is 60 to 65%. The left ventricle has normal function. The left ventricle has no regional wall motion abnormalities. The left ventricular internal cavity size was normal in size. There is  moderate left ventricular hypertrophy. Left ventricular diastolic parameters are  consistent with Grade I diastolic dysfunction (impaired relaxation). Right Ventricle: The right ventricular size is normal. No increase in right ventricular wall thickness. Right ventricular systolic function is normal. Left Atrium: Left atrial size was mildly dilated. Right Atrium: Right atrial size was mildly dilated. Pericardium: There is no evidence of pericardial effusion. Mitral Valve: The mitral valve was not well visualized. Trivial mitral valve regurgitation. Tricuspid Valve: The tricuspid valve is not well visualized. Tricuspid valve regurgitation is trivial. Aortic Valve: The aortic valve was not  well visualized. Aortic valve regurgitation is trivial. Mild aortic valve sclerosis is present, with no evidence of aortic valve stenosis. Pulmonic Valve: The pulmonic valve was not well visualized. Pulmonic valve regurgitation is not visualized. Aorta: The aortic root was not well visualized. IAS/Shunts: The interatrial septum was not assessed.  LEFT VENTRICLE PLAX 2D LVIDd:         4.43 cm LVIDs:         2.93 cm LV PW:         1.05 cm LV IVS:        1.30 cm LVOT diam:     2.00 cm LVOT Area:     3.14 cm  LEFT ATRIUM         Index LA diam:    3.80 cm 2.20 cm/m                        PULMONIC VALVE AORTA                 PV Vmax:        0.83 m/s Ao Root diam: 2.00 cm PV Peak grad:   2.8 mmHg                       RVOT Peak grad: 3 mmHg   SHUNTS Systemic Diam: 2.00 cm Bartholome Bill MD Electronically signed by Bartholome Bill MD Signature Date/Time: 07/15/2019/12:41:47 PM    Final    ASSESSMENT AND PLAN:  Ms. Quaranta is a 74 year old female with a past medical history significant for coronary artery disease s/p PCI to the RCA and LAD, sick sinus syndrome s/p permanent pacemaker insertion, HFpEF, moderate to severe aortic stenosis, type 2 diabetes, COPD, hypothyroidism, hyperlipidemia, and hypertension who presented to the ED on 07/15/19 for a month long history of worsening shortness of breath, orthopnea, and  PND.   1. Acute respiratory failure secondary to acute on chronic diastolic CHF as well as COPD exacerbation. -She will be continued on BiPAP overnight--now on 3l [place] and sats  100'% - continue steroid therapy oral l and duo nebs as scheduled on as needed basis--prednisone. - IV Rocephin and Zithromax for severe COPD exacerbation.-- cont zithromax x 3 days --pro calcitonin--<0.10 - will be diuresed with IV Lasix   40 mg for acute CHF that could be related to cor pulmonale. -cardiology consultation  Appreciated with Dr Ubaldo Glassing  2.  Uncontrolled type 2 diabetes mellitus with hyperglycemia and peripheral neuropathy. - patient will be placed on supplemental coverage with NovoLog and we will continue his basal coverage. -Neurontin will be continued -SSI -unsure which insulin pt takes at home--I will start Lantus here  3.  Hypertension -continue her antihypertensives.  4.  Hypothyroidism. - continue Synthroid and check TSH level.  5.  Ongoing tobacco abuse. -The patient was counseled for smoking cessation and will receive further counseling here.  6.  Depression. -We will continue Celexa, Rexulti and Cogentin.  7.  DVT prophylaxis. -Subcutaneous Lovenox.   All the records are reviewed and case discussed with ED provider. The plan of care was discussed in details with the patient (and family). I answered all questions. The patient agreed to proceed with the above mentioned plan. Further management will depend upon hospital course.   CODE STATUS: Full code  Status is: Inpatient  Remains inpatient appropriate because: getting treatment with IV Lasix for congestive heart failure. Patient is unsafe to go home given not much help.  Physical therapy recommends rehab. TOC working on rehab/SNF placement  Dispo: The patient is from: Home  Anticipated d/c is to: rehab  Anticipated d/c date is: 2 days  Patient currently is not medically stable to  d/c.   TOTAL TIME TAKING CARE OF THIS PATIENT: *25* minutes.  >50% time spent on counselling and coordination of care  Note: This dictation was prepared with Dragon dictation along with smaller phrase technology. Any transcriptional errors that result from this process are unintentional.  Enedina Finner M.D    [facility]   CC: Primary care physician; Marguarite Arbour, MDPatient ID: Melanie F Pepperman, female   DOB: [birth year], 74 y.o.   MRN: [id number]

## 2019-07-16 NOTE — TOC Progression Note (Signed)
Transition of Care [facility]) - Progression Note    Patient Details  Name: Melanie King MRN: [id number] Date of Birth: September 28, 1945  [facility]) CM/SW Contact  Liberta Gimpel, Gardiner Rhyme, LCSW Phone Number: 07/16/2019, 2:04 PM  Clinical Narrative:  Met with pt to discuss the PT recommendations she wants to get well but feels can't sitting in a chair for hours at a time. She is in agreement with this worker and will allow to start FL2, bed search and SNF process. She is aware [facility] can;t provide 24 hr care to her. She does not want to go to [facility] she had a bad experience with them. Will start process and work on available bed.     Expected Discharge Plan: [place] Barriers to Discharge: Continued Medical Work up  Expected Discharge Plan and Services Expected Discharge Plan: [place] In-house Referral: Clinical Social Work   Post Acute Care Choice: Home Health, Durable Medical Equipment                                         Social Determinants of Health (SDOH) Interventions    Readmission Risk Interventions No flowsheet data found.

## 2019-07-16 NOTE — ED Notes (Signed)
This RN called down to dietary and placed this pt's breakfast order.

## 2019-07-16 NOTE — TOC Progression Note (Signed)
Transition of Care [facility]) - Progression Note    Patient Details  Name: [place] F Buys MRN: [id number] Date of Birth: [birth year]  Transition of Care [facility]) CM/SW Contact  Salisa Broz, Lemar Livings, LCSW Phone Number: 07/16/2019, 4:13 PM  Clinical Narrative:   Pt has had both of her COVID vaccines will let facility know and await bed offers.    Expected Discharge Plan: Home w Home Health Services Barriers to Discharge: Continued Medical Work up  Expected Discharge Plan and Services Expected Discharge Plan: Home w Home Health Services In-house Referral: Clinical Social Work   Post Acute Care Choice: Home Health, Durable Medical Equipment                                         Social Determinants of Health (SDOH) Interventions    Readmission Risk Interventions No flowsheet data found.

## 2019-07-16 NOTE — ED Notes (Signed)
Pt was ambulated in the room by this RN. Pt is very unstable and needs 1 assist when getting out of bed and setting herself up to walk. Walker use is a must.   Pt ambulated to toilet on RA aprox 6 feet due to O2 chord length and de stated to 78%. Pt was quickly placed on [facility] where her stats rose back up to 94-96%.   Pt tearful due to her current situation and stating "I don't want to use this stuff. I just want to get better and go home". Pt was encouraged to use the recommended equipment to aid in safety.   Pt was ambulated in the room and after getting started she walked fairly well with occasional loss of gait.   Pt's bed linen and gown was changed due to soiling and a new external catheter was placed

## 2019-07-16 NOTE — ED Notes (Signed)
Pt awake and agitated at this time stating "that girl took my hearing aids and put the on the counter. Give them to me so I can hear". Pt was informed that her hearing aids were never brought to the hospital. Pt agitated sating "You liar, your a lying rascal. I know I had my hearing aids when I came here I had one in my ear and she took it".  Pt was asked to please lower her voice for the sake of the other patients and reminded that there were no hearing aids per her nurse from the previous shift. Room & belongings checked and no hearing aid was discovered.   Pt agitated stating "JUST GET OUT IM SICK OF THIS MESS JUST LEAVE!".   This RN stated informed pt that I would return when she had cooled of to further prevent the escalation of her irritation

## 2019-07-16 NOTE — Progress Notes (Signed)
Physical Therapy Treatment Patient Details Name: Melanie King MRN: [id number] DOB: 1945-10-24 Today's Date: 07/16/2019    History of Present Illness Melanie King 73yoF PMH: CHF, COPD, CAD, DM2, depression and GERD, who presented to the emergency room with acute onset of worsening dyspnea with associated dry cough and wheezing, orthopnea and paroxysmal nocturnal dyspnea.    PT Comments    Pt in bed upon entry, has doffed O2 at some point, unclear why or what for, but patient is satting at 87% upon entry. [place] donned on patient by author back to 2L, 97% throughout session- discussed with RN/RT pt could likely do well on 1L resting. Pt is unable to rise from EOB without physical assistance, and when she does attempt and come close to achieving this, nearly falls into floor. Once in standing pt is fairly independent with RW for balance, but does have 2 large LOB while up that author must correct with modA to prevent fall to floor. Pt blames a 'towel on the floor' however there is in fact no object on the floor at all. Pt is given extensive cues for AMB navigation in session, but struggles to follow commands, often needing extensive cues to perform simple motor tasks such as 'turning around.' Roommate and Friend [name] is in the room, seems to have similar issues with distractibility and cognition, Thereasa Parkin strongly feels he would not be able to meet the patient's safety needs at DC. Given pt's limited capacity to safely navigate household distances with RW without LOB, inability to rise to standing without LOB, difficulty with O2 compliance, STR seems a better place to achieve rehabilitative needs prior to return to living at home.     Follow Up Recommendations  Supervision - Intermittent;Supervision for mobility/OOB;SNF     Equipment Recommendations  Other (comment)(may need O2 at home)    Recommendations for Other Services       Precautions / Restrictions Precautions Precautions:  Fall Restrictions Weight Bearing Restrictions: No    Mobility  Bed Mobility Overal bed mobility: Needs Assistance Bed Mobility: Supine to Sit     Supine to sit: Supervision Sit to supine: Supervision   General bed mobility comments: heavy effort, but no assist required  Transfers Overall transfer level: Needs assistance Equipment used: Rolling walker (2 wheeled) Transfers: Sit to/from Stand Sit to Stand: Min assist         General transfer comment: unable to rise with out assistance, and has severe difficulty establishing balance upon rising.  Ambulation/Gait Ambulation/Gait assistance: Min guard Gait Distance (Feet): 100 Feet Assistive device: Rolling walker (2 wheeled)       General Gait Details: on 2L has upper CP and SOB after ~155ft, SpO2 97%; has 2 LOB that she is unable to correct. Struggles with followign verbal instructions for turns and Oncologist Rankin (Stroke Patients Only)       Balance Overall balance assessment: Needs assistance Sitting-balance support: No upper extremity supported;Feet supported Sitting balance-Leahy Scale: Good     Standing balance support: Bilateral upper extremity supported;During functional activity Standing balance-Leahy Scale: Poor                              Cognition Arousal/Alertness: Awake/alert Behavior During Therapy: WFL for tasks assessed/performed;Impulsive Overall Cognitive Status: Difficult to assess Area of Impairment: Following commands;Safety/judgement;Problem solving  Following Commands: Follows one step commands consistently Safety/Judgement: Decreased awareness of safety     General Comments: fairly distracted, limited safety awareness, difficulty following commands      Exercises      General Comments        Pertinent Vitals/Pain Pain Assessment: No/denies pain    Home Living  Family/patient expects to be discharged to:: Private residence Living Arrangements: Spouse/significant other                  Prior Function            PT Goals (current goals can now be found in the care plan section) Acute Rehab PT Goals Patient Stated Goal: reports she is working on getting into an ALF PT Goal Formulation: With patient/family Time For Goal Achievement: 06/29/19 Potential to Achieve Goals: Good Progress towards PT goals: Not progressing toward goals - comment    Frequency    Min 2X/week      PT Plan Discharge plan needs to be updated    Co-evaluation              AM-PAC PT "6 Clicks" Mobility   Outcome Measure  Help needed turning from your back to your side while in a flat bed without using bedrails?: A Little Help needed moving from lying on your back to sitting on the side of a flat bed without using bedrails?: A Little Help needed moving to and from a bed to a chair (including a wheelchair)?: A Little Help needed standing up from a chair using your arms (e.g., wheelchair or bedside chair)?: A Little Help needed to walk in hospital room?: A Little Help needed climbing 3-5 steps with a railing? : A Little 6 Click Score: 18    End of Session Equipment Utilized During Treatment: Gait belt Activity Tolerance: Patient tolerated treatment well;No increased pain;Patient limited by fatigue Patient left: with call bell/phone within reach;with family/visitor present;in chair;with nursing/sitter in room;with chair alarm set Nurse Communication: Mobility status PT Visit Diagnosis: Muscle weakness (generalized) (M62.81);Difficulty in walking, not elsewhere classified (R26.2);Repeated falls (R29.6)     Time: 7893-8101 PT Time Calculation (min) (ACUTE ONLY): 25 min  Charges:  $Gait Training: 8-22 mins $Therapeutic Exercise: 8-22 mins                     1:21 PM, 07/16/19 Etta Grandchild, PT, DPT Physical Therapist - [facility]  [phone] ([place])    [place] C 07/16/2019, 1:11 PM

## 2019-07-16 NOTE — Evaluation (Signed)
Occupational Therapy Evaluation Patient Details Name: Melanie King MRN: [id number] DOB: [birth year] Today's Date: 07/16/2019    History of Present Illness Melanie King 73yoF PMH: CHF, COPD, CAD, DM2, depression and GERD, who presented to the emergency room with acute onset of worsening dyspnea with associated dry cough and wheezing, orthopnea and paroxysmal nocturnal dyspnea.   Clinical Impression   Pt was seen for OT evaluation this date. Prior to hospital admission, pt was MOD I for fxl mobility and ADLs. Pt lives with a friend-Melanie King in one level apartment with 1 STE. Currently pt demonstrates impairments as described below (See OT problem list) which functionally limit her ability to perform ADL/self-care tasks. Pt currently requires CGA/MIN A for ADL transfers and mobility with at least support of 1 UE and requires MIN A for LB ADLs.  Pt would benefit from skilled OT to address noted impairments and functional limitations (see below for any additional details) in order to maximize safety and independence while minimizing falls risk and caregiver burden. Upon hospital discharge, recommend STR to maximize pt safety and return to PLOF.     Follow Up Recommendations  SNF;Supervision - Intermittent    Equipment Recommendations  3 in 1 bedside commode;Tub/shower seat    Recommendations for Other Services       Precautions / Restrictions Precautions Precautions: Fall Restrictions Weight Bearing Restrictions: No      Mobility Bed Mobility               General bed mobility comments: pt up to recliner at this time  Transfers Overall transfer level: Needs assistance Equipment used: Rolling walker (2 wheeled) Transfers: Sit to/from Stand Sit to Stand: Min guard;Min assist              Balance Overall balance assessment: Needs assistance Sitting-balance support: No upper extremity supported;Feet supported Sitting balance-Leahy Scale: Good     Standing balance support:  Single extremity supported Standing balance-Leahy Scale: Fair Standing balance comment: F static stand                           ADL either performed or assessed with clinical judgement   ADL                                         General ADL Comments: Pt able to perform seated ADLs such as grooming and UB dressing with extended time, does demo some difficulty with fine motor tasks such as tearing open condiments, states her hands are weak and "feel like steel". Able to perform LB dressing with sit/lateral lean and setup, however-for clothing mgt over hips, requires CGA-MIN A d/t decreased dynamic standing balance. Stands with CGA/MIN A with HHA.     Vision Baseline Vision/History: Wears glasses Wears Glasses: At all times Patient Visual Report: No change from baseline       Perception     Praxis      Pertinent Vitals/Pain Pain Assessment: No/denies pain Pain Location: vague mention of chronic back pain     Hand Dominance Right   Extremity/Trunk Assessment Upper Extremity Assessment Upper Extremity Assessment: Overall WFL for tasks assessed;Generalized weakness   Lower Extremity Assessment Lower Extremity Assessment: Overall WFL for tasks assessed;Generalized weakness       Communication Communication Communication: HOH   Cognition Arousal/Alertness: Awake/alert Behavior During Therapy: WFL for tasks assessed/performed;Impulsive Overall Cognitive Status:  Difficult to assess Area of Impairment: Following commands;Safety/judgement;Problem solving                       Following Commands: Follows one step commands consistently Safety/Judgement: Decreased awareness of safety     General Comments: decreased safety awareness, some difficulty with command following   General Comments       Exercises Other Exercises Other Exercises: OT facilitates education with pt re: importance of quitting smoking from a mobility and reduction  of hospitalization standpoint. Pt with MIN/MOD reception. Other Exercises: OT facilitates edcuation with pt re: role of OT despite having seen pt for OT on at least 2 recent previous admissions. Pt with MIN/MOD reception.   Shoulder Instructions      Home Living Family/patient expects to be discharged to:: Private residence Living Arrangements: Spouse/significant other Available Help at Discharge: Friend(s);Available PRN/intermittently Type of Home: Apartment Home Access: Stairs to enter Entrance Stairs-Number of Steps: 1 Entrance Stairs-Rails: None Home Layout: One level     Bathroom Shower/Tub: Teacher, early years/pre: Standard     Home Equipment: Environmental consultant - 4 wheels;Cane - single point   Additional Comments: Pt reports she lives with her friend Melanie King, however she is looking into moving into an ALF for increased assist/supervision.      Prior Functioning/Environment Level of Independence: Independent with assistive device(s)        Comments: prior notes report she uses rollator all the time, today she reports it just depends on how she feels        OT Problem List: Decreased strength;Decreased activity tolerance;Impaired balance (sitting and/or standing);Decreased safety awareness;Decreased knowledge of precautions;Decreased cognition      OT Treatment/Interventions: Self-care/ADL training;Therapeutic exercise;Energy conservation;DME and/or AE instruction;Therapeutic activities;Patient/family education;Balance training    OT Goals(Current goals can be found in the care plan section) Acute Rehab OT Goals Patient Stated Goal: to stay out of the hospital OT Goal Formulation: With patient/family Time For Goal Achievement: 07/30/19 Potential to Achieve Goals: Good  OT Frequency: Min 2X/week   Barriers to D/C:            Co-evaluation              AM-PAC OT "6 Clicks" Daily Activity     Outcome Measure Help from another person eating meals?:  None Help from another person taking care of personal grooming?: A Little Help from another person toileting, which includes using toliet, bedpan, or urinal?: A Little Help from another person bathing (including washing, rinsing, drying)?: A Little Help from another person to put on and taking off regular upper body clothing?: None Help from another person to put on and taking off regular lower body clothing?: A Little 6 Click Score: 20   End of Session Equipment Utilized During Treatment: Gait belt Nurse Communication: Mobility status;Other (comment)(pt with brief instance of nausea during session)  Activity Tolerance: Patient tolerated treatment well Patient left: in chair;with call bell/phone within reach;with chair alarm set  OT Visit Diagnosis: Unsteadiness on feet (R26.81);Muscle weakness (generalized) (M62.81)                Time: 2595-6387 OT Time Calculation (min): 42 min Charges:  OT General Charges $OT Visit: 1 Visit OT Evaluation $OT Eval Moderate Complexity: 1 Mod OT Treatments $Self Care/Home Management : 8-22 mins $Therapeutic Activity: 8-22 mins  Gerrianne Scale, MS, OTR/L ascom [phone] 07/16/19, 7:03 PM

## 2019-07-16 NOTE — ED Notes (Signed)
This RN received no call back to my main ASCOM or the main ED as to inquire about the patient going to 1C. The secretary Raquel Sarna was informed that no call had been received and that it was past the 15 minute mark. The Diplomatic Services operational officer for 1C (sherika) was encouraged to relay this information over to [place] and let her know that if she has any questions about the pt after reviewing the chart, to give me a call.   Secretary verbalized an understanding of such. ED Charge notified.

## 2019-07-16 NOTE — ED Notes (Signed)
Roger, pt's friend called and asked for an update. Phone was taken to pt. No questions posed to this RN at this time

## 2019-07-16 NOTE — Progress Notes (Addendum)
   07/16/19 1245  Clinical Encounter Type  Visited With Patient;Health care provider  Visit Type Initial  Referral From Nurse  Consult/Referral To Chaplain  Chaplain visited with patient in response to an OR for an AD. When chaplain arrived at the room, nurse was preparing meds for patient. Patient is hard of hearing and a little confused, but seems to know that she want's her sister-in-law to handle her affairs. Patient started talking about the things she has and chaplain explain that is not what an AD is for, but that she would print one off for her to read over. Patient appeared to be more concerned about all her sister-in-law has to do than she is in herself. Chaplain told her that she was beautiful and kind. Melanie King started crying. Patient would like for her sister-in-law to speak on her behalf regarding her health care. Chaplain will assist her with filling out AD and will bring witnesses and notary to her room when they are all here.

## 2019-07-16 NOTE — [place] FL2 (Signed)
[place] MEDICAID FL2 LEVEL OF CARE SCREENING TOOL     IDENTIFICATION  Patient Name: Melanie King Birthdate: 1945-06-21 Sex: female Admission Date (Current Location): 07/15/2019  [place] and [place] Number:  [address] [id number] [facility] Facility and Address:  [facility], [address], [place], [place] [address]      Provider Number: [id number]  Attending Physician Name and Address:  Enedina Finner, MD  Relative Name and Phone Number:  Ludger Nutting [phone]    Current Level of Care: Hospital Recommended Level of Care: Skilled Nursing Facility Prior Approval Number:    Date Approved/Denied:   PASRR Number: [id number]  Discharge Plan: SNF    Current Diagnoses: Patient Active Problem List   Diagnosis Date Noted  . Acute respiratory failure due to COVID-19 (HCC) 07/16/2019  . Acute respiratory failure (HCC) 07/15/2019  . Frequent falls 05/26/2019  . Generalized weakness 05/26/2019  . AKI (acute kidney injury) (HCC) 03/21/2019  . Subdural hematoma (HCC) 03/20/2019  . Hypothyroidism 03/10/2019  . Depression   . Chronic diastolic CHF (congestive heart failure) (HCC)   . Acute metabolic encephalopathy   . Fall   . Tobacco abuse   . C6 cervical fracture (HCC)   . Acute delirium 05/12/2017  . Seizure (HCC) 05/10/2017  . Elevated sedimentation rate 04/04/2017  . Elevated C-reactive protein (CRP) 04/04/2017  . Chest pain, unspecified 04/02/2017  . Bilateral lower extremity pain (Secondary Area of Pain) (R>L) 04/02/2017  . Chronic hip pain, bilateral  (Secondary Area of Pain) (R>L) 04/02/2017  . Chronic bilateral low back pain with bilateral sciatica [facility] Area of Pain) (R>L) 04/02/2017  . Chronic pain syndrome 04/02/2017  . Disorder of bone, unspecified 04/02/2017  . Other long term (current) drug therapy 04/02/2017  . Other specified health status 04/02/2017  . Long term current use of opiate analgesic 04/02/2017  . Hip pain, bilateral  12/06/2016  . Tremor 05/27/2016  . Trigger finger of left hand 02/16/2016  . Osteoarthritis of both hands 02/16/2016  . Angina pectoris (HCC) 01/17/2016  . COPD (chronic obstructive pulmonary disease) (HCC) 01/17/2016  . CAD (coronary artery disease) 01/17/2016  . HLD (hyperlipidemia) 01/17/2016  . Hypertension 01/17/2016  . Sick sinus syndrome (HCC) 01/17/2016  . Chronic pain 01/17/2016  . Personal history of tobacco use, presenting hazards to health 11/16/2015  . Type II diabetes mellitus with renal manifestations (HCC) 05/12/2013    Orientation RESPIRATION BLADDER Height & Weight     Self, Time, Situation, Place  O2(2 liters) Continent Weight: 162 lb (73.5 kg) Height:  5\' 1"  (154.9 cm)  BEHAVIORAL SYMPTOMS/MOOD NEUROLOGICAL BOWEL NUTRITION STATUS      Continent Diet(heart healthy thin liquids)  AMBULATORY STATUS COMMUNICATION OF NEEDS Skin   Limited Assist Verbally Normal                       Personal Care Assistance Level of Assistance  Bathing, Dressing Bathing Assistance: Limited assistance   Dressing Assistance: Limited assistance     Functional Limitations Info  Sight Sight Info: Impaired        SPECIAL CARE FACTORS FREQUENCY  PT (By licensed PT), OT (By licensed OT)     PT Frequency: 5x week OT Frequency: 3-5 x week            Contractures Contractures Info: Not present    Additional Factors Info  Code Status, Allergies Code Status Info: Full Code Allergies Info: Meperidine, Alprazolam, Fentanyl, Rantidine  Current Medications (07/16/2019):  This is the current hospital active medication list Current Facility-Administered Medications  Medication Dose Route Frequency Provider Last Rate Last Admin  . 0.9 %  sodium chloride infusion  250 mL Intravenous PRN Mansy, Jan A, MD      . acetaminophen (TYLENOL) tablet 650 mg  650 mg Oral Q4H PRN Mansy, Jan A, MD      . ALPRAZolam Prudy Feeler) tablet 0.25 mg  0.25 mg Oral BID PRN Mansy, Jan A,  MD      . amLODipine (NORVASC) tablet 10 mg  10 mg Oral Daily Mansy, Jan A, MD   10 mg at 07/16/19 1251  . aspirin EC tablet 81 mg  81 mg Oral Daily Mansy, Jan A, MD   81 mg at 07/16/19 1252  . azithromycin (ZITHROMAX) tablet 500 mg  500 mg Oral Daily Enedina Finner, MD   500 mg at 07/16/19 1251  . benztropine (COGENTIN) tablet 0.5 mg  0.5 mg Oral BID Mansy, Jan A, MD   0.5 mg at 07/16/19 1252  . brexpiprazole (REXULTI) tablet 1 mg  1 mg Oral QHS Mansy, Vernetta Honey, MD   Stopped at 07/15/19 2216  . calcitRIOL (ROCALTROL) capsule 0.25 mcg  0.25 mcg Oral Daily Mansy, Jan A, MD   0.25 mcg at 07/15/19 1041  . chlorpheniramine-HYDROcodone (TUSSIONEX) 10-8 MG/5ML suspension 5 mL  5 mL Oral Q12H PRN Mansy, Jan A, MD      . cholecalciferol (VITAMIN D3) tablet 1,000 Units  1,000 Units Oral Daily Mansy, Jan A, MD   1,000 Units at 07/16/19 1251  . citalopram (CELEXA) tablet 20 mg  20 mg Oral Daily Mansy, Jan A, MD   20 mg at 07/16/19 1252  . docusate sodium (COLACE) capsule 100 mg  100 mg Oral BID Mansy, Jan A, MD   100 mg at 07/16/19 1252  . enoxaparin (LOVENOX) injection 40 mg  40 mg Subcutaneous Q24H Mansy, Jan A, MD   40 mg at 07/16/19 1253  . famotidine (PEPCID) tablet 40 mg  40 mg Oral Daily Mansy, Jan A, MD   40 mg at 07/16/19 1250  . fenofibrate tablet 54 mg  54 mg Oral Daily Mansy, Jan A, MD   54 mg at 07/15/19 1042  . fluticasone (FLONASE) 50 MCG/ACT nasal spray 2 spray  2 spray Each Nare Daily Mansy, Jan A, MD   2 spray at 07/16/19 1257  . furosemide (LASIX) injection 40 mg  40 mg Intravenous Q12H Mansy, Jan A, MD   40 mg at 07/15/19 2024  . gabapentin (NEURONTIN) capsule 100 mg  100 mg Oral BID WC Mansy, Jan A, MD   100 mg at 07/16/19 1251  . gabapentin (NEURONTIN) capsule 300 mg  300 mg Oral QHS Enedina Finner, MD   Stopped at 07/15/19 2206  . guaiFENesin (MUCINEX) 12 hr tablet 600 mg  600 mg Oral BID Mansy, Jan A, MD   600 mg at 07/16/19 1253  . insulin aspart (novoLOG) injection 0-5 Units  0-5 Units  Subcutaneous QHS Enedina Finner, MD   4 Units at 07/15/19 2205  . insulin aspart (novoLOG) injection 0-9 Units  0-9 Units Subcutaneous TID WC Enedina Finner, MD   5 Units at 07/16/19 1255  . insulin glargine (LANTUS) injection 24 Units  24 Units Subcutaneous QHS Enedina Finner, MD   24 Units at 07/15/19 2203  . ipratropium-albuterol (DUONEB) 0.5-2.5 (3) MG/3ML nebulizer solution 3 mL  3 mL Nebulization QID Mansy, Vernetta Honey, MD  3 mL at 07/16/19 1126  . levothyroxine (SYNTHROID) tablet 125 mcg  125 mcg Oral QAC breakfast Mansy, Jan A, MD   125 mcg at 07/16/19 0650  . linagliptin (TRADJENTA) tablet 5 mg  5 mg Oral Daily Mansy, Jan A, MD   5 mg at 07/15/19 1044  . losartan (COZAAR) tablet 50 mg  50 mg Oral Daily Mansy, Jan A, MD   50 mg at 07/16/19 1251  . magnesium oxide (MAG-OX) tablet 400 mg  400 mg Oral Daily Mansy, Jan A, MD   400 mg at 07/16/19 1251  . meloxicam (MOBIC) tablet 15 mg  15 mg Oral Daily Mansy, Jan A, MD   15 mg at 07/15/19 1045  . metoprolol tartrate (LOPRESSOR) tablet 25 mg  25 mg Oral BID Mansy, Jan A, MD   25 mg at 07/16/19 1252  . montelukast (SINGULAIR) tablet 10 mg  10 mg Oral QHS Mansy, Jan A, MD   Stopped at 07/15/19 2216  . nicotine (NICODERM CQ - dosed in mg/24 hours) patch 21 mg  21 mg Transdermal Daily Mansy, Jan A, MD   21 mg at 07/16/19 1256  . nitroGLYCERIN (NITRODUR - Dosed in mg/24 hr) patch 0.2 mg  0.2 mg Transdermal Daily Mansy, Jan A, MD   0.2 mg at 07/15/19 1046  . ondansetron (ZOFRAN) injection 4 mg  4 mg Intravenous Q6H PRN Mansy, Jan A, MD      . potassium chloride SA (KLOR-CON) CR tablet 40 mEq  40 mEq Oral Once Fritzi Mandes, MD      . predniSONE (DELTASONE) tablet 50 mg  50 mg Oral Q breakfast Fritzi Mandes, MD      . primidone (MYSOLINE) tablet 50 mg  50 mg Oral BID Mansy, Arvella Merles, MD   Stopped at 07/15/19 2216  . protein supplement (ENSURE MAX) liquid  11 oz Oral BID BM Fritzi Mandes, MD      . risperiDONE (RISPERDAL M-TABS) disintegrating tablet 0.5 mg  0.5 mg Oral BID  Mansy, Jan A, MD   0.5 mg at 07/16/19 1253  . rosuvastatin (CRESTOR) tablet 40 mg  40 mg Oral QHS Mansy, Arvella Merles, MD   Stopped at 07/15/19 2215  . sodium chloride flush (NS) 0.9 % injection 3 mL  3 mL Intravenous Q12H Mansy, Jan A, MD   3 mL at 07/16/19 1255  . sodium chloride flush (NS) 0.9 % injection 3 mL  3 mL Intravenous PRN Mansy, Jan A, MD      . traMADol Veatrice Bourbon) tablet 50 mg  50 mg Oral Q12H PRN Mansy, Jan A, MD      . vitamin B-12 (CYANOCOBALAMIN) tablet 1,000 mcg  1,000 mcg Oral Daily Mansy, Jan A, MD   1,000 mcg at 07/16/19 1250  . zolpidem (AMBIEN) tablet 5 mg  5 mg Oral QHS PRN Mansy, Arvella Merles, MD         Discharge Medications: Please see discharge summary for a list of discharge medications.  Relevant Imaging Results:  Relevant Lab Results:   Additional Information SSN: [id number]  Treyvion Durkee, Gardiner Rhyme, LCSW

## 2019-07-16 NOTE — Progress Notes (Addendum)
Patient Name: Melanie King Date of Encounter: 07/16/2019  Hospital Problem List     Active Problems:   Acute respiratory failure (HCC)   Acute respiratory failure due to COVID-19 [facility])    Patient Profile     74 year old female with a past medical history significant for coronary artery disease s/p PCI to the RCA and LAD, sick sinus syndrome s/p permanent pacemaker insertion, HFpEF, moderate to severe aortic stenosis, type 2 diabetes, COPD, hypothyroidism, hyperlipidemia, and hypertension who presented to the ED on 07/15/19 for a month long history of worsening shortness of breath, orthopnea, and PND.  Workup in the ED was significant for chest xray revealing vascular congestion with diffuse interstitial and alveolar opacities, consistent with edema, creatinine of 1.27, BNP of 593, high sensitivity troponin borderline elevated at 54 and 48, and ECG revealing sinus rhythm with a RBBB.  She is followed in outpatient cardiology by Dr. Juliann Pares.   Most recent echocardiogram prior to admission on 03/29/17 revealed normal LV systolic function with an EF estimated between 60-65%, moderate to severe aortic stenosis.   Subjective   Difficult historian.  Somewhat confused.  Wants to go home.  Inpatient Medications    . amLODipine  10 mg Oral Daily  . aspirin EC  81 mg Oral Daily  . azithromycin  500 mg Oral Daily  . benztropine  0.5 mg Oral BID  . brexpiprazole  1 mg Oral QHS  . calcitRIOL  0.25 mcg Oral Daily  . cholecalciferol  1,000 Units Oral Daily  . citalopram  20 mg Oral Daily  . docusate sodium  100 mg Oral BID  . enoxaparin (LOVENOX) injection  40 mg Subcutaneous Q24H  . famotidine  40 mg Oral Daily  . fenofibrate  54 mg Oral Daily  . fluticasone  2 spray Each Nare Daily  . furosemide  40 mg Intravenous Q12H  . gabapentin  100 mg Oral BID WC  . gabapentin  300 mg Oral QHS  . guaiFENesin  600 mg Oral BID  . insulin aspart  0-5 Units Subcutaneous QHS  . insulin aspart  0-9 Units  Subcutaneous TID WC  . insulin glargine  24 Units Subcutaneous QHS  . ipratropium-albuterol  3 mL Nebulization QID  . levothyroxine  125 mcg Oral QAC breakfast  . linagliptin  5 mg Oral Daily  . losartan  50 mg Oral Daily  . magnesium oxide  400 mg Oral Daily  . meloxicam  15 mg Oral Daily  . metoprolol tartrate  25 mg Oral BID  . montelukast  10 mg Oral QHS  . nicotine  21 mg Transdermal Daily  . nitroGLYCERIN  0.2 mg Transdermal Daily  . predniSONE  50 mg Oral Q breakfast  . primidone  50 mg Oral BID  . Ensure Max Protein  11 oz Oral BID BM  . risperiDONE  0.5 mg Oral BID  . rosuvastatin  40 mg Oral QHS  . sodium chloride flush  3 mL Intravenous Q12H  . vitamin B-12  1,000 mcg Oral Daily    Vital Signs    Vitals:   07/15/19 2300 07/16/19 0740 07/16/19 0852 07/16/19 1126  BP: (!) 144/67 (!) 185/89 (!) 172/70   Pulse: 70 70 70   Resp: 14 18 18    Temp:  98.3 F (36.8 C) 98.6 F (37 C)   TempSrc:  Oral Oral   SpO2: 100% 97% 98% 97%  Weight:      Height:  Intake/Output Summary (Last 24 hours) at 07/16/2019 1149 Last data filed at 07/16/2019 0945 Gross per 24 hour  Intake 120 ml  Output 1000 ml  Net -880 ml   Filed Weights   07/15/19 0103  Weight: 73.5 kg    Physical Exam    GEN: Well nourished, well developed, in no acute distress.  HEENT: normal.  Neck: Supple, no JVD, carotid bruits, or masses. Cardiac: RRR, 2/6 systolic murmur radiating to the outflow tract, rubs, or gallops. No clubbing, cyanosis, edema.  Radials/DP/PT 2+ and equal bilaterally.  Respiratory:  Respirations regular and unlabored, clear to auscultation bilaterally. GI: Soft, nontender, nondistended, BS + x 4. MS: no deformity or atrophy. Skin: warm and dry, no rash. Neuro:  Strength and sensation are intact. Psych: Normal affect.  Labs    CBC Recent Labs    07/15/19 0525 07/16/19 0855  WBC 12.9* 15.0*  NEUTROABS 12.0* 12.5*  HGB 10.0* 9.6*  HCT 30.6* 28.8*  MCV 93.6 93.5   PLT 298 305   Basic Metabolic Panel Recent Labs    53/66/44 0055 07/16/19 0855  NA 136 136  K 3.9 3.3*  CL 106 101  CO2 20* 24  GLUCOSE 285* 265*  BUN 24* 34*  CREATININE 1.27* 1.22*  CALCIUM 8.8* 9.0   Liver Function Tests Recent Labs    07/15/19 0055  AST 15  ALT 17  ALKPHOS 72  BILITOT 0.6  PROT 7.0  ALBUMIN 3.6   No results for input(s): LIPASE, AMYLASE in the last 72 hours. Cardiac Enzymes No results for input(s): CKTOTAL, CKMB, CKMBINDEX, TROPONINI in the last 72 hours. BNP Recent Labs    07/15/19 0055  BNP 593.0*   D-Dimer No results for input(s): DDIMER in the last 72 hours. Hemoglobin A1C No results for input(s): HGBA1C in the last 72 hours. Fasting Lipid Panel No results for input(s): CHOL, HDL, LDLCALC, TRIG, CHOLHDL, LDLDIRECT in the last 72 hours. Thyroid Function Tests No results for input(s): TSH, T4TOTAL, T3FREE, THYROIDAB in the last 72 hours.  Invalid input(s): FREET3  Telemetry    Normal sinus rhythm  ECG    Normal sinus rhythm with right bundle branch block and left anterior fascicular block.  Radiology    DG Chest Port 1 View  Result Date: 07/15/2019 CLINICAL DATA:  Respiratory distress, upper chest pain EXAM: PORTABLE CHEST 1 VIEW COMPARISON:  06/12/2019 FINDINGS: Single frontal view of the chest demonstrates stable dual lead pacemaker. Cardiac silhouette is enlarged. There is vascular congestion with diffuse interstitial and alveolar opacities consistent with edema. Small right pleural effusion. No pneumothorax. IMPRESSION: 1. Congestive heart failure. Electronically Signed   By: Sharlet Salina M.D.   On: 07/15/2019 01:07   ECHOCARDIOGRAM COMPLETE  Result Date: 07/15/2019    ECHOCARDIOGRAM REPORT   Patient Name:   Melanie King Date of Exam: 07/15/2019 Medical Rec #:  [id number]       Height:       61.0 in Accession #:    [id number]      Weight:       162.0 lb Date of Birth:  1946/02/04       BSA:          1.727 m Patient Age:     73 years        BP:           135/59 mmHg Patient Gender: F               HR:  70 bpm. Exam Location:  [facility] Procedure: 2D Echo, Color Doppler and Cardiac Doppler Indications:     CHF- acute diastolic 428.31  History:         Patient has prior history of Echocardiogram examinations, most                  recent 03/26/2017. CHF, Pacemaker, COPD; Risk                  Factors:Hypertension and Diabetes.  Sonographer:     Cristela Blue RDCS (AE) Referring Phys:  [id number] Vernetta Honey MANSY Diagnosing Phys: Harold Hedge MD  Sonographer Comments: No apical window and no subcostal window. Image acquisition challenging due to COPD and Pt on Bipap. IMPRESSIONS  1. Left ventricular ejection fraction, by estimation, is 60 to 65%. The left ventricle has normal function. The left ventricle has no regional wall motion abnormalities. There is moderate left ventricular hypertrophy. Left ventricular diastolic parameters are consistent with Grade I diastolic dysfunction (impaired relaxation).  2. Right ventricular systolic function is normal. The right ventricular size is normal.  3. Left atrial size was mildly dilated.  4. Right atrial size was mildly dilated.  5. The mitral valve was not well visualized. Trivial mitral valve regurgitation.  6. The aortic valve was not well visualized. Aortic valve regurgitation is trivial. Mild aortic valve sclerosis is present, with no evidence of aortic valve stenosis. FINDINGS  Left Ventricle: Left ventricular ejection fraction, by estimation, is 60 to 65%. The left ventricle has normal function. The left ventricle has no regional wall motion abnormalities. The left ventricular internal cavity size was normal in size. There is  moderate left ventricular hypertrophy. Left ventricular diastolic parameters are consistent with Grade I diastolic dysfunction (impaired relaxation). Right Ventricle: The right ventricular size is normal. No increase in right ventricular wall thickness. Right ventricular  systolic function is normal. Left Atrium: Left atrial size was mildly dilated. Right Atrium: Right atrial size was mildly dilated. Pericardium: There is no evidence of pericardial effusion. Mitral Valve: The mitral valve was not well visualized. Trivial mitral valve regurgitation. Tricuspid Valve: The tricuspid valve is not well visualized. Tricuspid valve regurgitation is trivial. Aortic Valve: The aortic valve was not well visualized. Aortic valve regurgitation is trivial. Mild aortic valve sclerosis is present, with no evidence of aortic valve stenosis. Pulmonic Valve: The pulmonic valve was not well visualized. Pulmonic valve regurgitation is not visualized. Aorta: The aortic root was not well visualized. IAS/Shunts: The interatrial septum was not assessed.  LEFT VENTRICLE PLAX 2D LVIDd:         4.43 cm LVIDs:         2.93 cm LV PW:         1.05 cm LV IVS:        1.30 cm LVOT diam:     2.00 cm LVOT Area:     3.14 cm  LEFT ATRIUM         Index LA diam:    3.80 cm 2.20 cm/m                        PULMONIC VALVE AORTA                 PV Vmax:        0.83 m/s Ao Root diam: 2.00 cm PV Peak grad:   2.8 mmHg  RVOT Peak grad: 3 mmHg   SHUNTS Systemic Diam: 2.00 cm Bartholome Bill MD Electronically signed by Bartholome Bill MD Signature Date/Time: 07/15/2019/12:41:47 PM    Final     Assessment & Plan    1.  Acute respiratory distress             -On nasal cannula oxygen.             -Started on IV Solumedrol, Rocephin, and Zithromaz for COPD exacerbation   2.  Acute on chronic HFpEF             -Will continue to diuresis with close monitoring of renal function; continue with Lasix 20 mg daily at present.  Creatinine 1.22.  Will need to carefully follow.             -Echocardiogram showed  ejection fraction of 60 to 65% with moderate LVH, aortic sclerosis with no critical AS although valve was difficult to assess.  Very limited study.    3.  Moderate to severe AS             -Per  echocardiogram in 2019             -Echo during this visit was very limited but did not show critical AS with images available.  We will continue to follow as outpatient.  4.  Elevated troponin              -Borderline elevated, but flat with no significant delta; no further ischemic workup indicated during this admission   5.  History of sick sinus syndrome s/p pacemaker implantation              -6.4 months battery life; continue close f/u and monitoring in an outpatient setting   6.  Altered mental status   -Appears chronic.  Signed, Javier Docker Lella Mullany MD 07/16/2019, 11:49 AM  Pager: [phone]

## 2019-07-16 NOTE — ED Notes (Signed)
Attempted to call report to 1C, Deretha [facility] unable to accept report at this time. RN encouraged to review the chart and call back with any questions before the 15 minute mark. RN Thea Silversmith verbalized an understanding of such

## 2019-07-17 ENCOUNTER — Inpatient Hospital Stay: Payer: Medicare Other

## 2019-07-17 LAB — BLOOD GAS, ARTERIAL
Acid-Base Excess: 2.2 mmol/L — ABNORMAL HIGH (ref 0.0–2.0)
Bicarbonate: 27.3 mmol/L (ref 20.0–28.0)
FIO2: 0.21
O2 Saturation: 92 %
Patient temperature: 37
pCO2 arterial: 43 mmHg (ref 32.0–48.0)
pH, Arterial: 7.41 (ref 7.350–7.450)
pO2, Arterial: 63 mmHg — ABNORMAL LOW (ref 83.0–108.0)

## 2019-07-17 LAB — BASIC METABOLIC PANEL
Anion gap: 7 (ref 5–15)
BUN: 42 mg/dL — ABNORMAL HIGH (ref 8–23)
CO2: 26 mmol/L (ref 22–32)
Calcium: 9.4 mg/dL (ref 8.9–10.3)
Chloride: 108 mmol/L (ref 98–111)
Creatinine, Ser: 1.38 mg/dL — ABNORMAL HIGH (ref 0.44–1.00)
GFR calc Af Amer: 44 mL/min — ABNORMAL LOW (ref >60)
GFR calc non Af Amer: 38 mL/min — ABNORMAL LOW (ref >60)
Glucose, Bld: 143 mg/dL — ABNORMAL HIGH (ref 70–99)
Potassium: 4.7 mmol/L (ref 3.5–5.1)
Sodium: 141 mmol/L (ref 135–145)

## 2019-07-17 LAB — CBC WITH DIFFERENTIAL/PLATELET
Abs Immature Granulocytes: 0.04 10*3/uL (ref 0.00–0.07)
Basophils Absolute: 0 10*3/uL (ref 0.0–0.1)
Basophils Relative: 0 %
Eosinophils Absolute: 0 10*3/uL (ref 0.0–0.5)
Eosinophils Relative: 0 %
HCT: 28.5 % — ABNORMAL LOW (ref 36.0–46.0)
Hemoglobin: 9.4 g/dL — ABNORMAL LOW (ref 12.0–15.0)
Immature Granulocytes: 0 %
Lymphocytes Relative: 27 %
Lymphs Abs: 2.5 10*3/uL (ref 0.7–4.0)
MCH: 31 pg (ref 26.0–34.0)
MCHC: 33 g/dL (ref 30.0–36.0)
MCV: 94.1 fL (ref 80.0–100.0)
Monocytes Absolute: 1 10*3/uL (ref 0.1–1.0)
Monocytes Relative: 11 %
Neutro Abs: 5.7 10*3/uL (ref 1.7–7.7)
Neutrophils Relative %: 62 %
Platelets: 270 10*3/uL (ref 150–400)
RBC: 3.03 MIL/uL — ABNORMAL LOW (ref 3.87–5.11)
RDW: 15.2 % (ref 11.5–15.5)
WBC: 9.3 10*3/uL (ref 4.0–10.5)
nRBC: 0 % (ref 0.0–0.2)

## 2019-07-17 LAB — GLUCOSE, CAPILLARY
Glucose-Capillary: 117 mg/dL — ABNORMAL HIGH (ref 70–99)
Glucose-Capillary: 150 mg/dL — ABNORMAL HIGH (ref 70–99)
Glucose-Capillary: 196 mg/dL — ABNORMAL HIGH (ref 70–99)
Glucose-Capillary: 364 mg/dL — ABNORMAL HIGH (ref 70–99)
Glucose-Capillary: 309 mg/dL — ABNORMAL HIGH (ref 70–99)

## 2019-07-17 LAB — SARS CORONAVIRUS 2 BY RT PCR (HOSPITAL ORDER, PERFORMED IN ~~LOC~~ HOSPITAL LAB): SARS Coronavirus 2: NEGATIVE

## 2019-07-17 MED ORDER — FUROSEMIDE 20 MG PO TABS
20.0000 mg | ORAL_TABLET | Freq: Every day | ORAL | Status: DC
  Administered 2019-07-18 – 2019-07-20 (×3): 20 mg via ORAL
  Filled 2019-07-17 (×3): qty 1

## 2019-07-17 MED ORDER — PREDNISONE 20 MG PO TABS
30.0000 mg | ORAL_TABLET | Freq: Every day | ORAL | Status: DC
  Administered 2019-07-18 – 2019-07-20 (×3): 30 mg via ORAL
  Filled 2019-07-17 (×3): qty 1

## 2019-07-17 NOTE — Progress Notes (Signed)
Pt lethargic and slow to respond this am. VS and BG checked and both stable. RRT and Charge RN notified. NP notified. Blood gas obtained. Orders for stat Head CT.

## 2019-07-17 NOTE — TOC Progression Note (Addendum)
Transition of Care [facility]) - Progression Note    Patient Details  Name: Melanie King MRN: [id number] Date of Birth: 1945/10/04  Transition of Care [facility]) CM/SW Contact  Aradhana Gin, Gardiner Rhyme, LCSW Phone Number: 07/17/2019, 10:42 AM  Clinical Narrative:   Met with pt to ask pref [facility] or [facility] she has selected [place].  Will start insurance auth. Will need COVID test MD aware. [facility] aware of bed acceptance.Pt agreeable to rehab then her plan is to go to an ALF.  Reference Number [id number]  Expected Discharge Plan: [place] Barriers to Discharge: Continued Medical Work up  Expected Discharge Plan and Services Expected Discharge Plan: [place] In-house Referral: Clinical Social Work   Post Acute Care Choice: Home Health, Durable Medical Equipment                                         Social Determinants of Health (SDOH) Interventions    Readmission Risk Interventions No flowsheet data found.

## 2019-07-17 NOTE — Progress Notes (Signed)
   07/17/19 0600  Clinical Encounter Type  Visited With Patient;Health care provider  Visit Type Follow-up  Referral From Nurse  Consult/Referral To Chaplain  Chaplain received a RR page. When chaplain arrived medical staff was trying to get Melanie King to respond. They tickled her feet and got little response, it wasn't until doctor pitched her arm that patient really responded. Chaplain tried talking in patient's ear with no response. Chaplain left bedside after medical team finished working on patient. Chaplain will ask AM chaplain to check on Melanie King.

## 2019-07-17 NOTE — Progress Notes (Signed)
OT Cancellation Note  Patient Details Name: [place] F Lesinski MRN: [id number] DOB: 10-18-1945   Cancelled Treatment:    Reason Eval/Treat Not Completed: Patient at procedure or test/ unavailable(Pt. receiving Chaplain care services. Will  reattempt treatment session on the next available treatment date.)  Olegario Messier, MS, OTR/L 07/17/2019, 4:46 PM

## 2019-07-17 NOTE — Progress Notes (Addendum)
Patient Name: Melanie King Date of Encounter: 07/17/2019  Hospital Problem List     Active Problems:   Type 2 diabetes mellitus without complication, with long-term current use of insulin (HCC)   Acute respiratory failure (HCC)   Acute respiratory failure due to COVID-19 [facility])   Respiratory distress   Acute on chronic congestive heart failure [facility])    Patient Profile     74 year old female with a past medical history significant forcoronary artery disease s/p PCI to the RCA and LAD, sick sinus syndrome s/p permanent pacemaker insertion, HFpEF, moderate to severe aortic stenosis,type 2 diabetes, COPD, hypothyroidism, hyperlipidemia, and hypertension who presented to the ED on 07/15/19 for a month long history of worsening shortness of breath, orthopnea, and PND. Workup in the ED was significant for chest xray revealing vascular congestion with diffuse interstitial and alveolar opacities, consistent with edema, creatinine of 1.27, BNP of 593, high sensitivity troponin borderline elevated at 54 and 48, and ECG revealing sinus rhythm with a RBBB. She is followed in outpatient cardiology by Dr. Clayborn Bigness.Most recent echocardiogram prior to admission on 03/29/17 revealed normal LV systolic function with an EF estimated between 60-65%, moderate to severe aortic stenosis.   Subjective   Still somewhat confused.  Inpatient Medications    . amLODipine  10 mg Oral Daily  . aspirin EC  81 mg Oral Daily  . benztropine  0.5 mg Oral BID  . brexpiprazole  1 mg Oral QHS  . calcitRIOL  0.25 mcg Oral Daily  . cholecalciferol  1,000 Units Oral Daily  . citalopram  20 mg Oral Daily  . docusate sodium  100 mg Oral BID  . enoxaparin (LOVENOX) injection  40 mg Subcutaneous Q24H  . famotidine  40 mg Oral Daily  . fenofibrate  54 mg Oral Daily  . fluticasone  2 spray Each Nare Daily  . [START ON 07/18/2019] furosemide  20 mg Oral Daily  . gabapentin  100 mg Oral BID WC  . gabapentin  300 mg  Oral QHS  . guaiFENesin  600 mg Oral BID  . insulin aspart  0-5 Units Subcutaneous QHS  . insulin aspart  0-9 Units Subcutaneous TID WC  . insulin glargine  24 Units Subcutaneous QHS  . ipratropium-albuterol  3 mL Nebulization QID  . levothyroxine  125 mcg Oral QAC breakfast  . linagliptin  5 mg Oral Daily  . losartan  50 mg Oral Daily  . magnesium oxide  400 mg Oral Daily  . meloxicam  15 mg Oral Daily  . metoprolol tartrate  25 mg Oral BID  . montelukast  10 mg Oral QHS  . nicotine  21 mg Transdermal Daily  . nitroGLYCERIN  0.2 mg Transdermal Daily  . [START ON 07/18/2019] predniSONE  30 mg Oral Q breakfast  . primidone  50 mg Oral BID  . Ensure Max Protein  11 oz Oral BID BM  . risperiDONE  0.5 mg Oral BID  . rosuvastatin  40 mg Oral QHS  . sodium chloride flush  3 mL Intravenous Q12H  . vitamin B-12  1,000 mcg Oral Daily    Vital Signs    Vitals:   07/17/19 0400 07/17/19 0500 07/17/19 0600 07/17/19 0730  BP: (!) 149/71  (!) 147/62 (!) 126/54  Pulse: 70  70 72  Resp: 13  13 14   Temp:   (!) 97 F (36.1 C) 97.6 F (36.4 C)  TempSrc:   Axillary Axillary  SpO2: 98%  94%  99%  Weight:  68.7 kg    Height:        Intake/Output Summary (Last 24 hours) at 07/17/2019 1229 Last data filed at 07/17/2019 0530 Gross per 24 hour  Intake 480 ml  Output 900 ml  Net -420 ml   Filed Weights   07/15/19 0103 07/17/19 0500  Weight: 73.5 kg 68.7 kg    Physical Exam    GEN: Well nourished, well developed, in no acute distress.  HEENT: normal.  Neck: Supple, no JVD, carotid bruits, or masses. Cardiac: RRR, 2/6 systolic murmur radiating to the outflow tract Respiratory:  Respirations regular and unlabored, clear to auscultation bilaterally. GI: Soft, nontender, nondistended, BS + x 4. MS: no deformity or atrophy. Skin: warm and dry, no rash. Neuro: Difficult historian.  Somewhat confused.  Labs    CBC Recent Labs    07/16/19 0855 07/17/19 0505  WBC 15.0* 9.3  NEUTROABS  12.5* 5.7  HGB 9.6* 9.4*  HCT 28.8* 28.5*  MCV 93.5 94.1  PLT 305 270   Basic Metabolic Panel Recent Labs    35/36/14 0855 07/17/19 0505  NA 136 141  K 3.3* 4.7  CL 101 108  CO2 24 26  GLUCOSE 265* 143*  BUN 34* 42*  CREATININE 1.22* 1.38*  CALCIUM 9.0 9.4   Liver Function Tests Recent Labs    07/15/19 0055  AST 15  ALT 17  ALKPHOS 72  BILITOT 0.6  PROT 7.0  ALBUMIN 3.6   No results for input(s): LIPASE, AMYLASE in the last 72 hours. Cardiac Enzymes No results for input(s): CKTOTAL, CKMB, CKMBINDEX, TROPONINI in the last 72 hours. BNP Recent Labs    07/15/19 0055  BNP 593.0*   D-Dimer No results for input(s): DDIMER in the last 72 hours. Hemoglobin A1C No results for input(s): HGBA1C in the last 72 hours. Fasting Lipid Panel No results for input(s): CHOL, HDL, LDLCALC, TRIG, CHOLHDL, LDLDIRECT in the last 72 hours. Thyroid Function Tests No results for input(s): TSH, T4TOTAL, T3FREE, THYROIDAB in the last 72 hours.  Invalid input(s): FREET3  Telemetry    Sinus rhythm  ECG    Sinus rhythm with R BBB and  LAFB  Radiology    CT HEAD WO CONTRAST  Result Date: 07/17/2019 CLINICAL DATA:  Lethargy EXAM: CT HEAD WITHOUT CONTRAST TECHNIQUE: Contiguous axial images were obtained from the base of the skull through the vertex without intravenous contrast. COMPARISON:  06/12/2019 FINDINGS: Brain: No evidence of acute infarction, hemorrhage, hydrocephalus, extra-axial collection or mass lesion/mass effect. Remote lacunar infarct at the right putamen. Cerebral volume loss with ventriculomegaly. Vascular: Atherosclerotic plaque. Skull: Normal. Negative for fracture or focal lesion. Sinuses/Orbits: No acute finding. IMPRESSION: 1. No acute or interval finding. 2. Generalized atrophy. Electronically Signed   By: Marnee Spring M.D.   On: 07/17/2019 07:18   DG Chest Port 1 View  Result Date: 07/15/2019 CLINICAL DATA:  Respiratory distress, upper chest pain EXAM:  PORTABLE CHEST 1 VIEW COMPARISON:  06/12/2019 FINDINGS: Single frontal view of the chest demonstrates stable dual lead pacemaker. Cardiac silhouette is enlarged. There is vascular congestion with diffuse interstitial and alveolar opacities consistent with edema. Small right pleural effusion. No pneumothorax. IMPRESSION: 1. Congestive heart failure. Electronically Signed   By: Sharlet Salina M.D.   On: 07/15/2019 01:07   ECHOCARDIOGRAM COMPLETE  Result Date: 07/15/2019    ECHOCARDIOGRAM REPORT   Patient Name:   Melanie King Date of Exam: 07/15/2019 Medical Rec #:  [id number]  Height:       61.0 in Accession #:    [id number]      Weight:       162.0 lb Date of Birth:  1945-03-17       BSA:          1.727 m Patient Age:    73 years        BP:           135/59 mmHg Patient Gender: F               HR:           70 bpm. Exam Location:  [facility] Procedure: 2D Echo, Color Doppler and Cardiac Doppler Indications:     CHF- acute diastolic 428.31  History:         Patient has prior history of Echocardiogram examinations, most                  recent 03/26/2017. CHF, Pacemaker, COPD; Risk                  Factors:Hypertension and Diabetes.  Sonographer:     Cristela Blue RDCS (AE) Referring Phys:  [id number] Vernetta Honey MANSY Diagnosing Phys: Harold Hedge MD  Sonographer Comments: No apical window and no subcostal window. Image acquisition challenging due to COPD and Pt on Bipap. IMPRESSIONS  1. Left ventricular ejection fraction, by estimation, is 60 to 65%. The left ventricle has normal function. The left ventricle has no regional wall motion abnormalities. There is moderate left ventricular hypertrophy. Left ventricular diastolic parameters are consistent with Grade I diastolic dysfunction (impaired relaxation).  2. Right ventricular systolic function is normal. The right ventricular size is normal.  3. Left atrial size was mildly dilated.  4. Right atrial size was mildly dilated.  5. The mitral valve was not well visualized.  Trivial mitral valve regurgitation.  6. The aortic valve was not well visualized. Aortic valve regurgitation is trivial. Mild aortic valve sclerosis is present, with no evidence of aortic valve stenosis. FINDINGS  Left Ventricle: Left ventricular ejection fraction, by estimation, is 60 to 65%. The left ventricle has normal function. The left ventricle has no regional wall motion abnormalities. The left ventricular internal cavity size was normal in size. There is  moderate left ventricular hypertrophy. Left ventricular diastolic parameters are consistent with Grade I diastolic dysfunction (impaired relaxation). Right Ventricle: The right ventricular size is normal. No increase in right ventricular wall thickness. Right ventricular systolic function is normal. Left Atrium: Left atrial size was mildly dilated. Right Atrium: Right atrial size was mildly dilated. Pericardium: There is no evidence of pericardial effusion. Mitral Valve: The mitral valve was not well visualized. Trivial mitral valve regurgitation. Tricuspid Valve: The tricuspid valve is not well visualized. Tricuspid valve regurgitation is trivial. Aortic Valve: The aortic valve was not well visualized. Aortic valve regurgitation is trivial. Mild aortic valve sclerosis is present, with no evidence of aortic valve stenosis. Pulmonic Valve: The pulmonic valve was not well visualized. Pulmonic valve regurgitation is not visualized. Aorta: The aortic root was not well visualized. IAS/Shunts: The interatrial septum was not assessed.  LEFT VENTRICLE PLAX 2D LVIDd:         4.43 cm LVIDs:         2.93 cm LV PW:         1.05 cm LV IVS:        1.30 cm LVOT diam:     2.00 cm LVOT Area:  3.14 cm  LEFT ATRIUM         Index LA diam:    3.80 cm 2.20 cm/m                        PULMONIC VALVE AORTA                 PV Vmax:        0.83 m/s Ao Root diam: 2.00 cm PV Peak grad:   2.8 mmHg                       RVOT Peak grad: 3 mmHg   SHUNTS Systemic Diam: 2.00 cm  Harold Hedge MD Electronically signed by Harold Hedge MD Signature Date/Time: 07/15/2019/12:41:47 PM    Final     Assessment & Plan    1. Acute respiratory distress -On nasal cannula oxygen. -Started on IV Solumedrol, Rocephin, and Zithromaz for COPD exacerbation   2. Acute on chronic HFpEF -Will continue to diuresis with close monitoring of renal function; continue with Lasix 20 mg daily at present.  Creatinine increased to 1.38 from 1.22.  Will need to carefully follow. -Echocardiogram showed  ejection fraction of 60 to 65% with moderate LVH, aortic sclerosis with no critical AS although valve was difficult to assess.  Very limited study.  -1.3 L  3. Moderate to severe AS -Per echocardiogram in 2019 -Echo during this visit was very limited but did not show critical AS with images available.  We will continue to follow as outpatient.  4. Elevated troponin  -Borderline elevated, but flat with no significant delta; no further ischemic workup indicated during this admission   5. History of sick sinus syndrome s/p pacemaker implantation  -6.4 months battery life; continue close f/u and monitoring in an outpatient setting  6.  Altered mental status              -Brain CT showed generalized atrophy with no acute or interval changes.   Signed, Darlin Priestly Sekai Gitlin MD 07/17/2019, 12:29 PM  Pager: [phone]

## 2019-07-17 NOTE — Progress Notes (Signed)
[facility]  - [place] [facility]   PATIENT NAME: [place] Tawney    MR#:  [id number]  DATE OF BIRTH:  12/25/1945  SUBJECTIVE:  patient came in with increasing shortness of breath a few days. She was on BiPAP. Currently breathing comfortably able to complete sentence. On 3 L sats 100%  Has some chronic smokers cough. Denies fever. No leg swelling.  Patient tells me she is agreeable to go to rehab and from there to assisted living.  REVIEW OF SYSTEMS:   Review of Systems  Constitutional: Negative for chills, fever and weight loss.  HENT: Negative for ear discharge, ear pain and nosebleeds.   Eyes: Negative for blurred vision, pain and discharge.  Respiratory: Positive for cough and shortness of breath. Negative for sputum production, wheezing and stridor.   Cardiovascular: Negative for chest pain, palpitations and PND.  Gastrointestinal: Negative for abdominal pain, diarrhea, nausea and vomiting.  Genitourinary: Negative for frequency and urgency.  Musculoskeletal: Negative for back pain and joint pain.  Neurological: Positive for weakness. Negative for sensory change, speech change and focal weakness.  Psychiatric/Behavioral: Negative for depression and hallucinations. The patient is not nervous/anxious.    Tolerating Diet:yes Tolerating PT: rehab  DRUG ALLERGIES:   Allergies  Allergen Reactions  . Alprazolam Swelling  . Fentanyl Other (See Comments)    "burning and hot"  . Meperidine Itching    Other reaction(s): Other (See Comments)  . Ranitidine Hcl     Other reaction(s): Other (See Comments)    VITALS:  Blood pressure (!) 126/54, pulse 72, temperature 97.6 F (36.4 C), temperature source Axillary, resp. rate 14, height 5\' 1"  (1.549 m), weight 68.7 kg, SpO2 99 %.  PHYSICAL EXAMINATION:   Physical Exam  GENERAL:  74 y.o.-year-old patient lying in the bed with no acute distress.  EYES: Pupils equal, round, reactive to light and accommodation. No  scleral icterus.   HEENT: Head atraumatic, normocephalic. Oropharynx and nasopharynx clear.  NECK:  Supple, no jugular venous distention. No thyroid enlargement, no tenderness.  LUNGS:coarsel breath sounds bilaterally, no wheezing, rales, rhonchi. No use of accessory muscles of respiration.  CARDIOVASCULAR: S1, S2 normal. No murmurs, rubs, or gallops.  ABDOMEN: Soft, nontender, nondistended. Bowel sounds present. No organomegaly or mass.  EXTREMITIES: No cyanosis, clubbing or edema b/l.    NEUROLOGIC: Cranial nerves II through XII are intact. No focal Motor or sensory deficits b/l.   PSYCHIATRIC:  patient is alert and oriented x 3.  SKIN: No obvious rash, lesion, or ulcer.   LABORATORY PANEL:  CBC Recent Labs  Lab 07/17/19 0505  WBC 9.3  HGB 9.4*  HCT 28.5*  PLT 270    Chemistries  Recent Labs  Lab 07/15/19 0055 07/16/19 0855 07/17/19 0505  NA 136   < > 141  K 3.9   < > 4.7  CL 106   < > 108  CO2 20*   < > 26  GLUCOSE 285*   < > 143*  BUN 24*   < > 42*  CREATININE 1.27*   < > 1.38*  CALCIUM 8.8*   < > 9.4  AST 15  --   --   ALT 17  --   --   ALKPHOS 72  --   --   BILITOT 0.6  --   --    < > = values in this interval not displayed.   Cardiac Enzymes No results for input(s): TROPONINI in the last 168 hours. RADIOLOGY:  CT  HEAD WO CONTRAST  Result Date: 07/17/2019 CLINICAL DATA:  Lethargy EXAM: CT HEAD WITHOUT CONTRAST TECHNIQUE: Contiguous axial images were obtained from the base of the skull through the vertex without intravenous contrast. COMPARISON:  06/12/2019 FINDINGS: Brain: No evidence of acute infarction, hemorrhage, hydrocephalus, extra-axial collection or mass lesion/mass effect. Remote lacunar infarct at the right putamen. Cerebral volume loss with ventriculomegaly. Vascular: Atherosclerotic plaque. Skull: Normal. Negative for fracture or focal lesion. Sinuses/Orbits: No acute finding. IMPRESSION: 1. No acute or interval finding. 2. Generalized atrophy.  Electronically Signed   By: Monte Fantasia M.D.   On: 07/17/2019 07:18   ASSESSMENT AND PLAN:  Ms. Kuehne is a 74 year old female with a past medical history significant for coronary artery disease s/p PCI to the RCA and LAD, sick sinus syndrome s/p permanent pacemaker insertion, HFpEF, moderate to severe aortic stenosis, type 2 diabetes, COPD, hypothyroidism, hyperlipidemia, and hypertension who presented to the ED on 07/15/19 for a month long history of worsening shortness of breath, orthopnea, and PND.   1. Acute respiratory failure secondary to acute on chronic diastolic CHF as well as COPD exacerbation. -She will be continued on BiPAP overnight--now on 3l [place] and sats  100'% - continue steroid taper - duo nebs on as needed basis -cont zithromax x 3 days empiric--completed --pro calcitonin--<0.10 - was diuresed with IV Lasix   40 mg for acute CHF that could be related to cor pulmonale--now on lasix 20 mg po qd -cardiology consultation appreciated with Dr Ubaldo Glassing  2.  Uncontrolled type 2 diabetes mellitus with hyperglycemia and peripheral neuropathy. - patient will be placed on supplemental coverage with NovoLog and we will continue his basal coverage. -Neurontin will be continued -SSI -unsure which insulin pt takes at home--I will start Lantus here--sugars better  3.  Hypertension -continue her antihypertensives.  4.  Hypothyroidism. - continue Synthroid and check TSH level.  5.  Ongoing tobacco abuse. -The patient was counseled for smoking cessation and will receive further counseling here.  6.  Depression. -We will continue Celexa, Rexulti and Cogentin.  7.  DVT prophylaxis. -Subcutaneous Lovenox.   CODE STATUS: Full code  Status is: Inpatient  Remains inpatient appropriate because: getting treatment with IV Lasix for congestive heart failure. Patient is unsafe to go home given not much help. Physical therapy recommends rehab. TOC working on rehab/SNF  placement  Dispo: The patient is from: Home  Anticipated d/c is to: rehab  Anticipated d/c date is: likley tomorrow to rehab once insurance authorization and rehab bed available.  Patient currently is medically stable to d/c.   TOTAL TIME TAKING CARE OF THIS PATIENT: *25* minutes.  >50% time spent on counselling and coordination of care  Note: This dictation was prepared with Dragon dictation along with smaller phrase technology. Any transcriptional errors that result from this process are unintentional.  Fritzi Mandes M.D    [facility]   CC: Primary care physician; Idelle Crouch, MDPatient ID: [place] F Jhaveri, female   DOB: 1945-11-25, 73 y.o.   MRN: [id number]

## 2019-07-17 NOTE — Significant Event (Signed)
Rapid Response Event Note  Overview: Time Called: 0550 Arrival Time: 0553 Event Type: Neurologic  Initial Focused Assessment: RR called for pt with decreased LOC/lethargy. VSS on 1LNC, lung sounds clear.  Interventions: Pt responds to painful stimuli, localizing pain, opening eyes and tracking.  ABG ordered.   Plan of Care (if not transferred):  Webb Silversmith, NP at bedside to assess patient. Lab work reviewed; Head CT ordered. Please call if further assistance is needed; will follow up.  Event Summary: Name of Physician Notified: Webb Silversmith, NP at 0600  Outcome: Stayed in room and stabalized  Event End Time: 0610  Melanie King

## 2019-07-17 NOTE — Progress Notes (Signed)
[place] visited pt. per referral from [place]; pt. sitting up in bed, very glad to see [facility].  Pt. shared she has had a series of hardships recently re: her health; she says she lives w/a person named Cyndie Chime who is mentally disabled; the two met while living at the same apartment complex --> pt. appears to be caregiver to [place]; demands are exhausting per pt.  Pt. shared she lost her father and two of her brothers to suicide by gunshot; another brother hung himself and pt.'s sister died of cancer; all these losses appear to have taken place approx. 28yr ago --> pt. tearful in describing the pain of these deaths.  Pt. says her faith has been a major source of strength; 'Jesus has carried me every step of the way,' she shared.  CH provided extended active listening and facilitated life review.  CH offered prayer @ end of visit for pt.'s sense of divine strength and for a healthier living situation w/[facility] once pt. is able to go home.  Pt. says she is not sure if she can return home given demands placed on her by RCyndie Chime  CVelvaobserved pad of paper with informal will scrawled on it leaving all pt.'s 'stuff' to REaston pt. said RCyndie Chimehad written this during recent visit --> pt. is amenable to this will since she says she doesn't have anything valuable; she does not want her family to receive anything at all if she dies --appears to be very angry with them.  CH remains available as needed.    07/17/19 1700  Clinical Encounter Type  Visited With Patient  Visit Type Follow-up;Psychological support;Spiritual support;Social support  Referral From Chaplain  Spiritual Encounters  Spiritual Needs Emotional;Prayer  Stress Factors  Patient Stress Factors Family relationships;Health changes;Lack of caregivers;Loss of control;Major life changes

## 2019-07-18 DIAGNOSIS — J9601 Acute respiratory failure with hypoxia: Secondary | ICD-10-CM

## 2019-07-18 DIAGNOSIS — Z794 Long term (current) use of insulin: Secondary | ICD-10-CM

## 2019-07-18 DIAGNOSIS — E119 Type 2 diabetes mellitus without complications: Secondary | ICD-10-CM

## 2019-07-18 DIAGNOSIS — I5033 Acute on chronic diastolic (congestive) heart failure: Secondary | ICD-10-CM

## 2019-07-18 LAB — GLUCOSE, CAPILLARY
Glucose-Capillary: 136 mg/dL — ABNORMAL HIGH (ref 70–99)
Glucose-Capillary: 240 mg/dL — ABNORMAL HIGH (ref 70–99)
Glucose-Capillary: 388 mg/dL — ABNORMAL HIGH (ref 70–99)
Glucose-Capillary: 310 mg/dL — ABNORMAL HIGH (ref 70–99)

## 2019-07-18 LAB — CBC WITH DIFFERENTIAL/PLATELET
Abs Immature Granulocytes: 0.04 10*3/uL (ref 0.00–0.07)
Basophils Absolute: 0 10*3/uL (ref 0.0–0.1)
Basophils Relative: 0 %
Eosinophils Absolute: 0 10*3/uL (ref 0.0–0.5)
Eosinophils Relative: 0 %
HCT: 28.3 % — ABNORMAL LOW (ref 36.0–46.0)
Hemoglobin: 9.7 g/dL — ABNORMAL LOW (ref 12.0–15.0)
Immature Granulocytes: 0 %
Lymphocytes Relative: 15 %
Lymphs Abs: 1.6 10*3/uL (ref 0.7–4.0)
MCH: 30.6 pg (ref 26.0–34.0)
MCHC: 34.3 g/dL (ref 30.0–36.0)
MCV: 89.3 fL (ref 80.0–100.0)
Monocytes Absolute: 0.8 10*3/uL (ref 0.1–1.0)
Monocytes Relative: 7 %
Neutro Abs: 7.8 10*3/uL — ABNORMAL HIGH (ref 1.7–7.7)
Neutrophils Relative %: 78 %
Platelets: 297 10*3/uL (ref 150–400)
RBC: 3.17 MIL/uL — ABNORMAL LOW (ref 3.87–5.11)
RDW: 14.6 % (ref 11.5–15.5)
WBC: 10.2 10*3/uL (ref 4.0–10.5)
nRBC: 0 % (ref 0.0–0.2)

## 2019-07-18 LAB — BASIC METABOLIC PANEL
Anion gap: 9 (ref 5–15)
BUN: 45 mg/dL — ABNORMAL HIGH (ref 8–23)
CO2: 26 mmol/L (ref 22–32)
Calcium: 9.9 mg/dL (ref 8.9–10.3)
Chloride: 103 mmol/L (ref 98–111)
Creatinine, Ser: 1.2 mg/dL — ABNORMAL HIGH (ref 0.44–1.00)
GFR calc Af Amer: 52 mL/min — ABNORMAL LOW (ref >60)
GFR calc non Af Amer: 45 mL/min — ABNORMAL LOW (ref >60)
Glucose, Bld: 187 mg/dL — ABNORMAL HIGH (ref 70–99)
Potassium: 4 mmol/L (ref 3.5–5.1)
Sodium: 138 mmol/L (ref 135–145)

## 2019-07-18 MED ORDER — IPRATROPIUM-ALBUTEROL 0.5-2.5 (3) MG/3ML IN SOLN
3.0000 mL | Freq: Three times a day (TID) | RESPIRATORY_TRACT | Status: DC
  Administered 2019-07-18 – 2019-07-20 (×5): 3 mL via RESPIRATORY_TRACT
  Filled 2019-07-18 (×5): qty 3

## 2019-07-18 NOTE — Progress Notes (Signed)
Progress Note    Melanie King  YIR:[id number] DOB: [birth year]  DOA: 07/15/2019 PCP: Idelle Crouch, MD      Brief Narrative:    Medical records reviewed and are as summarized below:  Melanie King is an 74 y.o. female with a past medical history significant forcoronary artery disease s/p PCI to the RCA and LAD, sick sinus syndrome s/p permanent pacemaker insertion, HFpEF, moderate to severe aortic stenosis,type 2 diabetes, COPD, hypothyroidism, hyperlipidemia, and hypertension who presented to the ED on 07/15/19 for a month long history of worsening shortness of breath, orthopnea, and PND.      Assessment/Plan:   Active Problems:   Type 2 diabetes mellitus without complication, with long-term current use of insulin (HCC)   Acute respiratory failure (HCC)   Acute respiratory failure due to COVID-19 [facility])   Respiratory distress   Acute on chronic congestive heart failure ([place])   1. Acute respiratory failure secondary to acute on chronic diastolic CHF as well as COPD exacerbation. Improving.  IV Lasix has been switched to oral Lasix.  She completed a 3-day course of azithromycin. cor pulmonale may be playing a role in acute exacerbation of CHF.  Follow-up with cardiologist.  2. Uncontrolled type 2 diabetes mellitus with hyperglycemia and peripheral neuropathy. Continue Lantus, NovoLog and Neurontin. -SSI  3. Hypertension -continue her antihypertensives.  4.Hypothyroidism. - continue Synthroid  5. Ongoing tobacco abuse. She has been advised to quit smoking cigarettes.  6. Depression. Continue antidepressants.  7.  Dysphagia Consulted gastroenterologist for further evaluation.  Plan for EGD tomorrow.  8. CKD stage IIIa Creatinine is stable.  Body mass index is 28.61 kg/m.   Family Communication/Anticipated D/C date and plan/Code Status   DVT prophylaxis: Lovenox Code Status: Full code Family Communication: Plan discussed with  patient Disposition Plan:    Status is: Inpatient  Remains inpatient appropriate because:Inpatient level of care appropriate due to severity of illness   Dispo: The patient is from: Home              Anticipated d/c is to: SNF              Anticipated d/c date is: 1 day              Patient currently is not medically stable to d/c.            Subjective:   She complains of difficulty swallowing.  She said that food gets stuck in her throat and sometimes she feels as though she is "about to choke to death".  Shortness of breath has improved.  Objective:    Vitals:   07/17/19 1547 07/17/19 2047 07/17/19 2331 07/18/19 0747  BP: (!) 161/75  (!) 152/70 (!) 196/70  Pulse:   70 70  Resp: 19  20 15   Temp: 97.7 F (36.5 C)  97.7 F (36.5 C) 98.1 F (36.7 C)  TempSrc: Oral  Oral Oral  SpO2: 98% 97% 93% 100%  Weight:      Height:       No data found.   Intake/Output Summary (Last 24 hours) at 07/18/2019 1102 Last data filed at 07/18/2019 0900 Gross per 24 hour  Intake 360 ml  Output 1350 ml  Net -990 ml   Filed Weights   07/15/19 0103 07/17/19 0500  Weight: 73.5 kg 68.7 kg    Exam:  GEN: NAD SKIN: No rash EYES: EOMI ENT: MMM CV: RRR PULM: CTA B ABD: soft, ND, NT, +  BS CNS: AAO x 3, non focal EXT: No edema or tenderness   Data Reviewed:   I have personally reviewed following labs and imaging studies:  Labs: Labs show the following:   Basic Metabolic Panel: Recent Labs  Lab 07/15/19 0055 07/15/19 0055 07/16/19 0855 07/16/19 0855 07/17/19 0505 07/18/19 0426  NA 136  --  136  --  141 138  K 3.9   < > 3.3*   < > 4.7 4.0  CL 106  --  101  --  108 103  CO2 20*  --  24  --  26 26  GLUCOSE 285*  --  265*  --  143* 187*  BUN 24*  --  34*  --  42* 45*  CREATININE 1.27*  --  1.22*  --  1.38* 1.20*  CALCIUM 8.8*  --  9.0  --  9.4 9.9   < > = values in this interval not displayed.   GFR Estimated Creatinine Clearance: 37 mL/min (A) (by C-G  formula based on SCr of 1.2 mg/dL (H)). Liver Function Tests: Recent Labs  Lab 07/15/19 0055  AST 15  ALT 17  ALKPHOS 72  BILITOT 0.6  PROT 7.0  ALBUMIN 3.6   No results for input(s): LIPASE, AMYLASE in the last 168 hours. No results for input(s): AMMONIA in the last 168 hours. Coagulation profile No results for input(s): INR, PROTIME in the last 168 hours.  CBC: Recent Labs  Lab 07/15/19 0055 07/15/19 0525 07/16/19 0855 07/17/19 0505 07/18/19 0426  WBC 12.0* 12.9* 15.0* 9.3 10.2  NEUTROABS 8.9* 12.0* 12.5* 5.7 7.8*  HGB 9.9* 10.0* 9.6* 9.4* 9.7*  HCT 30.1* 30.6* 28.8* 28.5* 28.3*  MCV 93.5 93.6 93.5 94.1 89.3  PLT 319 298 305 270 297   Cardiac Enzymes: No results for input(s): CKTOTAL, CKMB, CKMBINDEX, TROPONINI in the last 168 hours. BNP (last 3 results) No results for input(s): PROBNP in the last 8760 hours. CBG: Recent Labs  Lab 07/17/19 0745 07/17/19 1153 07/17/19 1657 07/17/19 2233 07/18/19 0752  GLUCAP 150* 196* 364* 309* 136*   D-Dimer: No results for input(s): DDIMER in the last 72 hours. Hgb A1c: No results for input(s): HGBA1C in the last 72 hours. Lipid Profile: No results for input(s): CHOL, HDL, LDLCALC, TRIG, CHOLHDL, LDLDIRECT in the last 72 hours. Thyroid function studies: No results for input(s): TSH, T4TOTAL, T3FREE, THYROIDAB in the last 72 hours.  Invalid input(s): FREET3 Anemia work up: No results for input(s): VITAMINB12, FOLATE, FERRITIN, TIBC, IRON, RETICCTPCT in the last 72 hours. Sepsis Labs: Recent Labs  Lab 07/15/19 0055 07/15/19 0055 07/15/19 0339 07/15/19 0525 07/16/19 0855 07/17/19 0505 07/18/19 0426  PROCALCITON  --   --  <0.10  --   --   --   --   WBC 12.0*   < >  --  12.9* 15.0* 9.3 10.2  LATICACIDVEN 1.5  --   --  3.0*  --   --   --    < > = values in this interval not displayed.    Microbiology Recent Results (from the past 240 hour(s))  SARS Coronavirus 2 by RT PCR (hospital order, performed in [facility] hospital lab) Nasopharyngeal Nasopharyngeal Swab     Status: None   Collection Time: 07/15/19  1:11 AM   Specimen: Nasopharyngeal Swab  Result Value Ref Range Status   SARS Coronavirus 2 NEGATIVE NEGATIVE Final    Comment: (NOTE) SARS-CoV-2 target nucleic acids are NOT DETECTED. The SARS-CoV-2 RNA  is generally detectable in upper and lower respiratory specimens during the acute phase of infection. The lowest concentration of SARS-CoV-2 viral copies this assay can detect is 250 copies / mL. A negative result does not preclude SARS-CoV-2 infection and should not be used as the sole basis for treatment or other patient management decisions.  A negative result may occur with improper specimen collection / handling, submission of specimen other than nasopharyngeal swab, presence of viral mutation(s) within the areas targeted by this assay, and inadequate number of viral copies (<250 copies / mL). A negative result must be combined with clinical observations, patient history, and epidemiological information. Fact Sheet for Patients:   BoilerBrush.com.cy Fact Sheet for Healthcare Providers: https://pope.com/ This test is not yet approved or cleared  by the [place] FDA and has been authorized for detection and/or diagnosis of SARS-CoV-2 by FDA under an Emergency Use Authorization (EUA).  This EUA will remain in effect (meaning this test can be used) for the duration of the COVID-19 declaration under Section 564(b)(1) of the Act, 21 U.S.C. section 360bbb-3(b)(1), unless the authorization is terminated or revoked sooner. Performed at [facility], [address] [place], [place] [address]   Culture, blood (routine x 2)     Status: None (Preliminary result)   Collection Time: 07/15/19  2:32 AM   Specimen: BLOOD LEFT HAND  Result Value Ref Range Status   Specimen Description BLOOD LEFT HAND  Final   Special Requests    Final    BOTTLES DRAWN AEROBIC AND ANAEROBIC Blood Culture adequate volume   Culture   Final    NO GROWTH 3 DAYS Performed at [facility], [address] [place], [place] [address]    Report Status PENDING  Incomplete  Culture, blood (routine x 2)     Status: None (Preliminary result)   Collection Time: 07/15/19  2:32 AM   Specimen: Left Antecubital; Blood  Result Value Ref Range Status   Specimen Description LEFT ANTECUBITAL  Final   Special Requests   Final    BOTTLES DRAWN AEROBIC AND ANAEROBIC Blood Culture adequate volume   Culture   Final    NO GROWTH 3 DAYS Performed at [facility], [address], [place], [place] [address]    Report Status PENDING  Incomplete  SARS Coronavirus 2 by RT PCR (hospital order, performed in [facility] hospital lab) Nasopharyngeal Nasopharyngeal Swab     Status: None   Collection Time: 07/17/19 11:08 AM   Specimen: Nasopharyngeal Swab  Result Value Ref Range Status   SARS Coronavirus 2 NEGATIVE NEGATIVE Final    Comment: (NOTE) SARS-CoV-2 target nucleic acids are NOT DETECTED. The SARS-CoV-2 RNA is generally detectable in upper and lower respiratory specimens during the acute phase of infection. The lowest concentration of SARS-CoV-2 viral copies this assay can detect is 250 copies / mL. A negative result does not preclude SARS-CoV-2 infection and should not be used as the sole basis for treatment or other patient management decisions.  A negative result may occur with improper specimen collection / handling, submission of specimen other than nasopharyngeal swab, presence of viral mutation(s) within the areas targeted by this assay, and inadequate number of viral copies (<250 copies / mL). A negative result must be combined with clinical observations, patient history, and epidemiological information. Fact Sheet for Patients:   BoilerBrush.com.cy Fact Sheet for Healthcare  Providers: https://pope.com/ This test is not yet approved or cleared  by the [place] FDA and has been  authorized for detection and/or diagnosis of SARS-CoV-2 by FDA under an Emergency Use Authorization (EUA).  This EUA will remain in effect (meaning this test can be used) for the duration of the COVID-19 declaration under Section 564(b)(1) of the Act, 21 U.S.C. section 360bbb-3(b)(1), unless the authorization is terminated or revoked sooner. Performed at [facility], [address] [place], [place] [address]     Procedures and diagnostic studies:  CT HEAD WO CONTRAST  Result Date: 07/17/2019 CLINICAL DATA:  Lethargy EXAM: CT HEAD WITHOUT CONTRAST TECHNIQUE: Contiguous axial images were obtained from the base of the skull through the vertex without intravenous contrast. COMPARISON:  06/12/2019 FINDINGS: Brain: No evidence of acute infarction, hemorrhage, hydrocephalus, extra-axial collection or mass lesion/mass effect. Remote lacunar infarct at the right putamen. Cerebral volume loss with ventriculomegaly. Vascular: Atherosclerotic plaque. Skull: Normal. Negative for fracture or focal lesion. Sinuses/Orbits: No acute finding. IMPRESSION: 1. No acute or interval finding. 2. Generalized atrophy. Electronically Signed   By: Marnee Spring M.D.   On: 07/17/2019 07:18    Medications:   . amLODipine  10 mg Oral Daily  . aspirin EC  81 mg Oral Daily  . benztropine  0.5 mg Oral BID  . brexpiprazole  1 mg Oral QHS  . calcitRIOL  0.25 mcg Oral Daily  . cholecalciferol  1,000 Units Oral Daily  . citalopram  20 mg Oral Daily  . docusate sodium  100 mg Oral BID  . enoxaparin (LOVENOX) injection  40 mg Subcutaneous Q24H  . famotidine  40 mg Oral Daily  . fenofibrate  54 mg Oral Daily  . fluticasone  2 spray Each Nare Daily  . furosemide  20 mg Oral Daily  . gabapentin  100 mg Oral BID WC  . gabapentin  300 mg Oral QHS  . guaiFENesin  600 mg Oral BID   . insulin aspart  0-5 Units Subcutaneous QHS  . insulin aspart  0-9 Units Subcutaneous TID WC  . insulin glargine  24 Units Subcutaneous QHS  . ipratropium-albuterol  3 mL Nebulization QID  . levothyroxine  125 mcg Oral QAC breakfast  . linagliptin  5 mg Oral Daily  . losartan  50 mg Oral Daily  . magnesium oxide  400 mg Oral Daily  . meloxicam  15 mg Oral Daily  . metoprolol tartrate  25 mg Oral BID  . montelukast  10 mg Oral QHS  . nicotine  21 mg Transdermal Daily  . nitroGLYCERIN  0.2 mg Transdermal Daily  . predniSONE  30 mg Oral Q breakfast  . primidone  50 mg Oral BID  . Ensure Max Protein  11 oz Oral BID BM  . risperiDONE  0.5 mg Oral BID  . rosuvastatin  40 mg Oral QHS  . sodium chloride flush  3 mL Intravenous Q12H  . vitamin B-12  1,000 mcg Oral Daily   Continuous Infusions: . sodium chloride       LOS: 3 days   Khrista Braun  [facility]     07/18/2019, 11:02 AM

## 2019-07-18 NOTE — Consult Note (Signed)
Cephas Darby, MD [address]  [place]  [place], [place] [address]  Main: [phone]  Fax: [phone] Pager: [phone]   Consultation  Referring Provider:     No ref. provider found Primary Care Physician:  Idelle Crouch, MD Primary Gastroenterologist: Althia Forts       Reason for Consultation:   Dysphagia  Date of Admission:  07/15/2019 Date of Consultation:  07/18/2019         HPI:   Melanie King is a 74 y.o. female with history of multiple comorbidities COPD, CHF EF of 92 to 65%, moderate to severe arctic stenosis, hypertension, diabetes, s/p pacemaker who is admitted on 5/11 secondary to acute respiratory failure from acute on chronic diastolic heart failure, COPD exacerbation.  She was initially admitted to stepdown unit, was on BiPAP, managed for both CHF and COPD with antibiotics, steroids and diuretics. Patient also has altered mental status, she is weaned off BiPAP, on regular floor, currently on room air.  GI is consulted as patient complained of choking sensation in her throat and she has been experiencing the same at home.  Patient denies any difficulty swallowing liquids.  Patient is somewhat delirious when I saw her and she did not answer all my questions  NSAIDs: None  Antiplts/Anticoagulants/Anti thrombotics: None  GI Procedures: None  Past Medical History:  Diagnosis Date  . Arthritis   . CHF (congestive heart failure) ([place])   . COPD (chronic obstructive pulmonary disease) ([place])   . Coronary arteriosclerosis   . DDD (degenerative disc disease), cervical   . DDD (degenerative disc disease), lumbar   . Depression   . Diabetes mellitus without complication ([place])   . GERD (gastroesophageal reflux disease)   . Hyperlipidemia   . Hypertension   . Osteoarthritis   . Pacemaker   . Tremor     Past Surgical History:  Procedure Laterality Date  . ABDOMINAL HYSTERECTOMY    . BREAST BIOPSY Right 1994   neg cyst removed  .  CHOLECYSTECTOMY    . EYE SURGERY  1964, 1966, and 1967   bilateral  . FOOT SURGERY Right    cellulitis  . PACEMAKER INSERTION    . SPINE SURGERY     cyst removed; [facility]    Prior to Admission medications   Medication Sig Start Date End Date Taking? Authorizing Provider  amLODipine (NORVASC) 10 MG tablet TAKE 1 TABLET BY MOUTH EVERY DAY 08/28/16  Yes Kathrine Haddock, NP  aspirin EC 81 MG tablet Take 81 mg by mouth daily.   Yes [provider]  benztropine (COGENTIN) 0.5 MG tablet Take 0.5 mg by mouth 2 (two) times daily.   Yes [provider]  brexpiprazole (REXULTI) 1 MG TABS tablet Take 1 mg by mouth at bedtime.   Yes [provider]  calcitRIOL (ROCALTROL) 0.25 MCG capsule Take 0.25 mcg by mouth daily.   Yes [provider]  cholecalciferol (VITAMIN D3) 25 MCG (1000 UNIT) tablet Take 1,000 Units by mouth daily.   Yes [provider]  citalopram (CELEXA) 20 MG tablet Take 20 mg by mouth daily.   Yes [provider]  docusate sodium (COLACE) 100 MG capsule Take 100 mg by mouth 2 (two) times daily.   Yes [provider]  famotidine (PEPCID) 40 MG tablet Take 40 mg by mouth daily.   Yes [provider]  fenofibrate (TRICOR) 48 MG tablet Take 48 mg by mouth daily.   Yes [provider]  fluticasone (FLONASE) 50 MCG/ACT nasal spray Place 2 sprays into both nostrils daily.   Yes [provider]  furosemide (LASIX) 20 MG tablet Take 20 mg by mouth daily.   Yes [provider]  gabapentin (NEURONTIN) 100 MG capsule Take 100-300 mg by mouth See admin instructions. Take 1 capsule (172m) by mouth every morning and daily at noon and take 3 capsules (3071m by mouth every night   Yes [provider]  insulin aspart protamine- aspart (NOVOLOG MIX 70/30) (70-30) 100 UNIT/ML injection Inject into the skin 2 (two) times daily with a meal.   Yes [provider]  LANTUS SOLOSTAR 100  UNIT/ML Solostar Pen Inject 24 Units into the skin at bedtime.  03/19/19  Yes [provider]  levothyroxine (SYNTHROID) 125 MCG tablet Take 1 tablet (125 mcg total) by mouth daily before breakfast. 05/28/19  Yes Dhungel, Nishant, MD  losartan (COZAAR) 50 MG tablet Take 50 mg by mouth daily.   Yes [provider]  magnesium oxide (MAG-OX) 400 MG tablet Take 400 mg by mouth daily.   Yes [provider]  meloxicam (MOBIC) 15 MG tablet Take 15 mg by mouth daily. 05/30/19  Yes [provider]  metFORMIN (GLUCOPHAGE) 1000 MG tablet Take 0.5 tablets (500 mg total) by mouth 2 (two) times daily with a meal. 05/28/19  Yes Dhungel, Nishant, MD  metoprolol tartrate (LOPRESSOR) 25 MG tablet Take 25 mg by mouth 2 (two) times daily.   Yes [provider]  montelukast (SINGULAIR) 10 MG tablet Take 10 mg by mouth at bedtime.   Yes [provider]  nitroGLYCERIN (NITRODUR - DOSED IN MG/24 HR) 0.2 mg/hr patch Place 0.2 mg onto the skin daily. leave patch on 12-14 hours, then remove for 10-12 hours prior to applying the next patch   Yes [provider]  primidone (MYSOLINE) 50 MG tablet Take 50 mg by mouth 2 (two) times daily.   Yes [provider]  risperiDONE (RISPERDAL M-TABS) 0.5 MG disintegrating tablet Take 0.5 mg by mouth 2 (two) times daily.   Yes [provider]  rosuvastatin (CRESTOR) 40 MG tablet Take 40 mg by mouth at bedtime.   Yes [provider]  sitaGLIPtin (JANUVIA) 50 MG tablet Take 50 mg by mouth daily.   Yes [provider]  traMADol (ULTRAM) 50 MG tablet Take 1 tablet (50 mg total) by mouth every 12 (twelve) hours as needed for moderate pain. 05/28/19  Yes Dhungel, Nishant, MD  valsartan (DIOVAN) 160 MG tablet Take 160 mg by mouth daily.   Yes [provider]  vitamin B-12 (CYANOCOBALAMIN) 1000 MCG tablet Take 1,000 mcg by mouth daily.   Yes [provider]  nicotine (NICODERM CQ - DOSED  IN MG/24 HOURS) 21 mg/24hr patch Place 1 patch (21 mg total) onto the skin daily. Patient not taking: Reported on 07/15/2019 03/13/19   DhLouellen MolderMD   Current Facility-Administered Medications:  .  0.9 %  sodium chloride infusion, 250 mL, Intravenous, PRN, Mansy, Jan A, MD .  acetaminophen (TYLENOL) tablet 650 mg, 650 mg, Oral, Q4H PRN, Mansy, Jan A, MD, 650 mg at 07/16/19 2131 .  amLODipine (NORVASC) tablet 10 mg, 10 mg, Oral, Daily, Mansy, Jan A, MD, 10 mg at 07/18/19 0911 .  aspirin EC tablet 81 mg, 81 mg, Oral, Daily, Mansy, Jan A, MD, 81 mg at 07/18/19 0913 .  benztropine (COGENTIN) tablet 0.5 mg, 0.5 mg, Oral, BID, Mansy, Jan A, MD, 0.5 mg  at 07/18/19 0916 .  brexpiprazole (REXULTI) tablet 1 mg, 1 mg, Oral, QHS, Mansy, Jan A, MD, 1 mg at 07/17/19 2157 .  calcitRIOL (ROCALTROL) capsule 0.25 mcg, 0.25 mcg, Oral, Daily, Mansy, Jan A, MD, 0.25 mcg at 07/18/19 0916 .  chlorpheniramine-HYDROcodone (TUSSIONEX) 10-8 MG/5ML suspension 5 mL, 5 mL, Oral, Q12H PRN, Mansy, Jan A, MD .  cholecalciferol (VITAMIN D3) tablet 1,000 Units, 1,000 Units, Oral, Daily, Mansy, Arvella Merles, MD, 1,000 Units at 07/18/19 0912 .  citalopram (CELEXA) tablet 20 mg, 20 mg, Oral, Daily, Mansy, Jan A, MD, 20 mg at 07/18/19 0913 .  docusate sodium (COLACE) capsule 100 mg, 100 mg, Oral, BID, Mansy, Jan A, MD, 100 mg at 07/18/19 0911 .  enoxaparin (LOVENOX) injection 40 mg, 40 mg, Subcutaneous, Q24H, Mansy, Jan A, MD, 40 mg at 07/18/19 0918 .  famotidine (PEPCID) tablet 40 mg, 40 mg, Oral, Daily, Mansy, Jan A, MD, 40 mg at 07/18/19 0912 .  fenofibrate tablet 54 mg, 54 mg, Oral, Daily, Mansy, Jan A, MD, 54 mg at 07/18/19 0915 .  fluticasone (FLONASE) 50 MCG/ACT nasal spray 2 spray, 2 spray, Each Nare, Daily, Mansy, Jan A, MD, 2 spray at 07/18/19 0919 .  furosemide (LASIX) tablet 20 mg, 20 mg, Oral, Daily, Fritzi Mandes, MD, 20 mg at 07/18/19 0913 .  gabapentin (NEURONTIN) capsule 100 mg, 100 mg, Oral, BID WC, Mansy, Jan A, MD, 100  mg at 07/18/19 1323 .  gabapentin (NEURONTIN) capsule 300 mg, 300 mg, Oral, QHS, Fritzi Mandes, MD, 300 mg at 07/16/19 2131 .  guaiFENesin (MUCINEX) 12 hr tablet 600 mg, 600 mg, Oral, BID, Mansy, Jan A, MD, 600 mg at 07/18/19 0916 .  hydrOXYzine (ATARAX/VISTARIL) tablet 25 mg, 25 mg, Oral, BID PRN, Fritzi Mandes, MD, 25 mg at 07/18/19 0915 .  insulin aspart (novoLOG) injection 0-5 Units, 0-5 Units, Subcutaneous, QHS, Fritzi Mandes, MD, 4 Units at 07/17/19 2251 .  insulin aspart (novoLOG) injection 0-9 Units, 0-9 Units, Subcutaneous, TID WC, Fritzi Mandes, MD, 3 Units at 07/18/19 1323 .  insulin glargine (LANTUS) injection 24 Units, 24 Units, Subcutaneous, QHS, Fritzi Mandes, MD, 24 Units at 07/17/19 2251 .  ipratropium-albuterol (DUONEB) 0.5-2.5 (3) MG/3ML nebulizer solution 3 mL, 3 mL, Nebulization, QID, Mansy, Jan A, MD, 3 mL at 07/18/19 1131 .  levothyroxine (SYNTHROID) tablet 125 mcg, 125 mcg, Oral, QAC breakfast, Mansy, Jan A, MD, 125 mcg at 07/18/19 0523 .  linagliptin (TRADJENTA) tablet 5 mg, 5 mg, Oral, Daily, Mansy, Jan A, MD, 5 mg at 07/18/19 0915 .  losartan (COZAAR) tablet 50 mg, 50 mg, Oral, Daily, Mansy, Jan A, MD, 50 mg at 07/18/19 0911 .  magnesium oxide (MAG-OX) tablet 400 mg, 400 mg, Oral, Daily, Mansy, Jan A, MD, 400 mg at 07/18/19 0911 .  meloxicam (MOBIC) tablet 15 mg, 15 mg, Oral, Daily, Mansy, Jan A, MD, 15 mg at 07/18/19 0915 .  metoprolol tartrate (LOPRESSOR) tablet 25 mg, 25 mg, Oral, BID, Mansy, Jan A, MD, 25 mg at 07/18/19 0912 .  montelukast (SINGULAIR) tablet 10 mg, 10 mg, Oral, QHS, Mansy, Jan A, MD, 10 mg at 07/17/19 2158 .  nicotine (NICODERM CQ - dosed in mg/24 hours) patch 21 mg, 21 mg, Transdermal, Daily, Mansy, Jan A, MD, 21 mg at 07/16/19 1256 .  nitroGLYCERIN (NITRODUR - Dosed in mg/24 hr) patch 0.2 mg, 0.2 mg, Transdermal, Daily, Mansy, Jan A, MD, 0.2 mg at 07/18/19 0921 .  ondansetron (ZOFRAN) injection 4 mg, 4 mg, Intravenous, Q6H PRN, Mansy,  Jan A, MD .  predniSONE  (DELTASONE) tablet 30 mg, 30 mg, Oral, Q breakfast, Fritzi Mandes, MD, 30 mg at 07/18/19 0912 .  primidone (MYSOLINE) tablet 50 mg, 50 mg, Oral, BID, Mansy, Jan A, MD, 50 mg at 07/18/19 0915 .  protein supplement (ENSURE MAX) liquid, 11 oz, Oral, BID BM, Fritzi Mandes, MD, 11 oz at 07/18/19 1324 .  risperiDONE (RISPERDAL M-TABS) disintegrating tablet 0.5 mg, 0.5 mg, Oral, BID, Mansy, Jan A, MD, 0.5 mg at 07/18/19 0916 .  rosuvastatin (CRESTOR) tablet 40 mg, 40 mg, Oral, QHS, Mansy, Jan A, MD, 40 mg at 07/17/19 2158 .  sodium chloride flush (NS) 0.9 % injection 10-40 mL, 10-40 mL, Intracatheter, PRN, Fritzi Mandes, MD .  sodium chloride flush (NS) 0.9 % injection 3 mL, 3 mL, Intravenous, Q12H, Mansy, Jan A, MD, 3 mL at 07/18/19 0921 .  sodium chloride flush (NS) 0.9 % injection 3 mL, 3 mL, Intravenous, PRN, Mansy, Jan A, MD .  traMADol Veatrice [place]bon) tablet 50 mg, 50 mg, Oral, Q12H PRN, Mansy, Jan A, MD .  vitamin B-12 (CYANOCOBALAMIN) tablet 1,000 mcg, 1,000 mcg, Oral, Daily, Mansy, Jan A, MD, 1,000 mcg at 07/18/19 0912 .  zolpidem (AMBIEN) tablet 5 mg, 5 mg, Oral, QHS PRN, Mansy, Arvella Merles, MD   Family History  Problem Relation Age of Onset  . Breast cancer Maternal Aunt 78  . Cancer Mother        colon  . Aneurysm Father      Social History   Tobacco Use  . Smoking status: Current Every Day Smoker    Packs/day: 1.00    Years: 50.00    Pack years: 50.00    Types: Cigarettes  . Smokeless tobacco: Never Used  Substance Use Topics  . Alcohol use: No  . Drug use: No    Allergies as of 07/15/2019 - Review Complete 07/15/2019  Allergen Reaction Noted  . Alprazolam Swelling 10/27/2014  . Fentanyl Other (See Comments) 04/02/2015  . Meperidine Itching 04/02/2015  . Ranitidine hcl  04/02/2015    Review of Systems:    All systems reviewed and negative except where noted in HPI.   Physical Exam:  Vital signs in last 24 hours: Temp:  [97.7 F (36.5 C)-98.1 F (36.7 C)] 98.1 F (36.7 C) (05/14  0747) Pulse Rate:  [70] 70 (05/14 0747) Resp:  [15-20] 15 (05/14 0747) BP: (152-196)/(70-75) 196/70 (05/14 0747) SpO2:  [93 %-100 %] 100 % (05/14 0747) Last BM Date: 07/15/19 General:   Pleasant, cooperative in NAD Head:  Normocephalic and atraumatic. Eyes:   No icterus.   Conjunctiva pink. PERRLA. Ears:  Normal auditory acuity. Neck:  Supple; no masses or thyroidomegaly Lungs: Respirations even and unlabored. Lungs clear to auscultation bilaterally.   No wheezes, crackles, or rhonchi.  Heart:  Regular rate and rhythm;  Without murmur, clicks, rubs or gallops Abdomen:  Soft, nondistended, nontender. Normal bowel sounds. No appreciable masses or hepatomegaly.  No rebound or guarding.  Rectal:  Not performed. Msk:  Symmetrical without gross deformities.  Strength normal Extremities:  Without edema, cyanosis or clubbing. Neurologic:  Alert and oriented x1;  grossly normal neurologically. Skin:  Intact without significant lesions or rashes. Psych:  Alert and cooperative. Normal affect.  LAB RESULTS: CBC Latest Ref Rng & Units 07/18/2019 07/17/2019 07/16/2019  WBC 4.0 - 10.5 K/uL 10.2 9.3 15.0(H)  Hemoglobin 12.0 - 15.0 g/dL 9.7(L) 9.4(L) 9.6(L)  Hematocrit 36.0 - 46.0 % 28.3(L) 28.5(L) 28.8(L)  Platelets 150 -  400 K/uL 297 270 305    BMET BMP Latest Ref Rng & Units 07/18/2019 07/17/2019 07/16/2019  Glucose 70 - 99 mg/dL 187(H) 143(H) 265(H)  BUN 8 - 23 mg/dL 45(H) 42(H) 34(H)  Creatinine 0.44 - 1.00 mg/dL 1.20(H) 1.38(H) 1.22(H)  BUN/Creat Ratio 12 - 28 - - -  Sodium 135 - 145 mmol/L 138 141 136  Potassium 3.5 - 5.1 mmol/L 4.0 4.7 3.3(L)  Chloride 98 - 111 mmol/L 103 108 101  CO2 22 - 32 mmol/L '26 26 24  ' Calcium 8.9 - 10.3 mg/dL 9.9 9.4 9.0    LFT Hepatic Function Latest Ref Rng & Units 07/15/2019 06/12/2019 05/31/2019  Total Protein 6.5 - 8.1 g/dL 7.0 8.1 7.2  Albumin 3.5 - 5.0 g/dL 3.6 4.0 3.9  AST 15 - 41 U/L '15 20 21  ' ALT 0 - 44 U/L '17 20 16  ' Alk Phosphatase 38 - 126 U/L 72 75  63  Total Bilirubin 0.3 - 1.2 mg/dL 0.6 0.5 0.5  Bilirubin, Direct 0.0 - 0.2 mg/dL - - -     STUDIES: CT HEAD WO CONTRAST  Result Date: 07/17/2019 CLINICAL DATA:  Lethargy EXAM: CT HEAD WITHOUT CONTRAST TECHNIQUE: Contiguous axial images were obtained from the base of the skull through the vertex without intravenous contrast. COMPARISON:  06/12/2019 FINDINGS: Brain: No evidence of acute infarction, hemorrhage, hydrocephalus, extra-axial collection or mass lesion/mass effect. Remote lacunar infarct at the right putamen. Cerebral volume loss with ventriculomegaly. Vascular: Atherosclerotic plaque. Skull: Normal. Negative for fracture or focal lesion. Sinuses/Orbits: No acute finding. IMPRESSION: 1. No acute or interval finding. 2. Generalized atrophy. Electronically Signed   By: Monte Fantasia M.D.   On: 07/17/2019 07:18      Impression / Plan:   Melanie King is a 74 y.o. female with CHF, COPD, moderate to severe aortic stenosis, diabetes, hypertension initially admitted with acute on chronic CHF and COPD exacerbation leading to acute respiratory failure, was on BiPAP on admission.  Respiratory distress has resolved.  GI is consulted for possible dysphagia  Dysphagia: Recommend EGD for further evaluation after obtaining clearance from cardiology If cardiology clears, can perform EGD tomorrow N.p.o. past midnight  Thank you for involving me in the care of this patient.   Dr. Bonna Gains will cover for the weekend    LOS: 3 days   Sherri Sear, MD  07/18/2019, 2:47 PM   Note: This dictation was prepared with Dragon dictation along with smaller phrase technology. Any transcriptional errors that result from this process are unintentional.

## 2019-07-18 NOTE — Care Management Important Message (Signed)
Important Message  Patient Details  Name: Melanie King MRN: [id number] Date of Birth: [birth year]   Medicare Important Message Given:  Yes     Olegario Messier A Reonna Finlayson 07/18/2019, 11:33 AM

## 2019-07-18 NOTE — TOC Progression Note (Addendum)
Transition of Care [facility]) - Progression Note    Patient Details  Name: Melanie King MRN: [id number] Date of Birth: 1945-12-10  Transition [facility] CM/SW Contact  Judah Chevere, Lemar Livings, LCSW Phone Number: 07/18/2019, 1:27 PM  Clinical Narrative:  [facility] needed additional information regarding pt's medical stability for transfer and another PT note. Have faxed into [facility] ref number [id number].  3:54 PM Received insurance Berkley Harvey [id number] effective today until 5/18. Ref [id number]. Not sure if medically stable to transfer Sandra-SW to check on this  Expected Discharge Plan: Home w Home Health Services Barriers to Discharge: Continued Medical Work up  Expected Discharge Plan and Services Expected Discharge Plan: Home w Home Health Services In-house Referral: Clinical Social Work   Post Acute Care Choice: Home Health, Durable Medical Equipment                                         Social Determinants of Health (SDOH) Interventions    Readmission Risk Interventions No flowsheet data found.

## 2019-07-18 NOTE — Progress Notes (Signed)
Physical Therapy Treatment Patient Details Name: Melanie King MRN: [id number] DOB: [birth year] Today's Date: 07/18/2019    History of Present Illness 48yoF PMH: CHF, COPD, CAD, DM2, depression and GERD, who presented to the emergency room with acute onset of worsening dyspnea with associated dry cough and wheezing, orthopnea and paroxysmal nocturnal dyspnea.    PT Comments    Pt continues to be confused and impulsive, this PT saw her 5/11 in the ED and she was more alert and safe with ambulation at that time. Current nurse reports that she has been getting more confused each day here and today's session is essentially further confirmation as such.  She was very pleasant and eager to work/walk with PT but ultimately needed almost constant cuing and direct assist with walker to remain safe.  Pt is not safe to manage at home at this time and will require short term rehab to get back to safe and appropriate mobility and function.    Follow Up Recommendations  SNF;Supervision/Assistance - 24 hour     Equipment Recommendations       Recommendations for Other Services       Precautions / Restrictions Precautions Precautions: Fall Restrictions Weight Bearing Restrictions: No    Mobility  Bed Mobility Overal bed mobility: Needs Assistance Bed Mobility: Supine to Sit     Supine to sit: Min guard     General bed mobility comments: Pt able to initiate transition to sitting well, but nearly gets stuck 3/4 of the way up and needed to struggle with leveraging LEs off EOB, nearly scoots herself off EOB in the process  Transfers Overall transfer level: Needs assistance Equipment used: Rolling walker (2 wheeled) Transfers: Sit to/from Stand Sit to Stand: Min assist         General transfer comment: While PT was literally telling he to wait until I got her lines and walker set up she impulsively tried to stand (nearly successfully w/o AD) but did not get to fully upright and needed  direct assist to keep from falling back on the bed.  Even when given the walker she was unsteady and needed assist to keep from falling back again.   Ambulation/Gait Ambulation/Gait assistance: Min assist Gait Distance (Feet): 75 Feet Assistive device: Rolling walker (2 wheeled)       General Gait Details: Pt needed essentially constant cuing just to keep inside walker and to avoid running into obstacels.  She showed little ability to self correct and was impulsive with direction, walker manipulation and unaware about turns/room/etc despite plenty of cuing to insure she went the right way, etc.    Stairs             Wheelchair Mobility    Modified Rankin (Stroke Patients Only)       Balance Overall balance assessment: Needs assistance Sitting-balance support: No upper extremity supported;Feet supported Sitting balance-Leahy Scale: Fair     Standing balance support: Bilateral upper extremity supported Standing balance-Leahy Scale: Poor Standing balance comment: reliant on walker for balance, poor awareness of using/positioning it appropriately despite almost constant cuing.                              Cognition Arousal/Alertness: Awake/alert Behavior During Therapy: Impulsive Overall Cognitive Status: Impaired/Different from baseline  General Comments: This PT saw pt in ED, she appears even more confused this date with decreased safety awareness, and difficulty with command following - this decline is cooraborated with her nurse and the PT that last treated her.      Exercises      General Comments        Pertinent Vitals/Pain Pain Assessment: No/denies pain    Home Living                      Prior Function            PT Goals (current goals can now be found in the care plan section) Progress towards PT goals: Not progressing toward goals - comment(pt more confused and impulsive this  session)    Frequency    Min 2X/week      PT Plan Current plan remains appropriate    Co-evaluation              AM-PAC PT "6 Clicks" Mobility   Outcome Measure  Help needed turning from your back to your side while in a flat bed without using bedrails?: A Little Help needed moving from lying on your back to sitting on the side of a flat bed without using bedrails?: A Little Help needed moving to and from a bed to a chair (including a wheelchair)?: A Little Help needed standing up from a chair using your arms (e.g., wheelchair or bedside chair)?: A Little Help needed to walk in hospital room?: A Lot Help needed climbing 3-5 steps with a railing? : A Lot 6 Click Score: 16    End of Session Equipment Utilized During Treatment: Gait belt Activity Tolerance: Patient tolerated treatment well;No increased pain;Patient limited by fatigue Patient left: with call bell/phone within reach;with family/visitor present;with chair alarm set Nurse Communication: Mobility status PT Visit Diagnosis: Muscle weakness (generalized) (M62.81);Difficulty in walking, not elsewhere classified (R26.2);Repeated falls (R29.6)     Time: 8250-5397 PT Time Calculation (min) (ACUTE ONLY): 26 min  Charges:  $Gait Training: 8-22 mins $Therapeutic Activity: 8-22 mins                     Malachi Pro, DPT 07/18/2019, 5:57 PM

## 2019-07-18 NOTE — Progress Notes (Signed)
Inpatient Diabetes Program Recommendations  AACE/ADA: New Consensus Statement on Inpatient Glycemic Control (2015)  Target Ranges:  Prepandial:   less than 140 mg/dL      Peak postprandial:   less than 180 mg/dL (1-2 hours)      Critically ill patients:  140 - 180 mg/dL   Lab Results  Component Value Date   GLUCAP 136 (H) 07/18/2019   HGBA1C 7.1 (H) 05/27/2019    Review of Glycemic Control Results for Piacentini, [place] F (MRN [id number]) as of 07/18/2019 09:36  Ref. Range 07/17/2019 07:45 07/17/2019 11:53 07/17/2019 16:57 07/17/2019 22:33 07/18/2019 07:52  Glucose-Capillary Latest Ref Range: 70 - 99 mg/dL 947 (H) 654 (H) 650 (H) 309 (H) 136 (H)   Diabetes history: DM 2 Outpatient Diabetes medications: Lantus 24 QHS + 70/30 Insulin BID per SSI + Metformin 500 mg BID + Januvia 50 mg QD  Current orders for Inpatient glycemic control:  Lantus 24 units qhs Novolog 0-9 units tid + hs  Tradjenta 5 mg Daily  Inpatient Diabetes Program Recommendations:    Noted PO prednisone dose lowered again today to 30 mg Daily  Glucose trends increase after meals.  -Add Novolog 3-4 units tid meal coverage if eating >50% of meals.  May need to reduce Lantus dose if fasting glucose is significantly lower tomorrow morning.  Thanks,  Christena Deem RN, MSN, BC-ADM Inpatient Diabetes Coordinator Team Pager [phone] (8a-5p)

## 2019-07-18 NOTE — Progress Notes (Signed)
Patient Name: Melanie King Date of Encounter: 07/18/2019  Hospital Problem List     Active Problems:   Type 2 diabetes mellitus without complication, with long-term current use of insulin (HCC)   Acute respiratory failure (HCC)   Acute respiratory failure due to COVID-19 [facility])   Respiratory distress   Acute on chronic congestive heart failure [facility])    Patient Profile    74 year old female with a past medical history significant forcoronary artery disease s/p PCI to the RCA and LAD, sick sinus syndrome s/p permanent pacemaker insertion, HFpEF, moderate to severe aortic stenosis,type 2 diabetes, COPD, hypothyroidism, hyperlipidemia, and hypertension who presented to the ED on 07/15/19 for a month long history of worsening shortness of breath, orthopnea, and PND. Workup in the ED was significant for chest xray revealing vascular congestion with diffuse interstitial and alveolar opacities, consistent with edema, creatinine of 1.27, BNP of 593, high sensitivity troponin borderline elevated at 54 and 48, and ECG revealing sinus rhythm with a RBBB. She is followed in outpatient cardiology by Dr. Juliann Pares.Most recent echocardiogramprior to admissionon 03/29/17 revealed normal LV systolic function with an EF estimated between 60-65%, moderate to severe aortic stenosis.   Subjective   Difficult historian. Complains of dysphagia  Inpatient Medications    . amLODipine  10 mg Oral Daily  . aspirin EC  81 mg Oral Daily  . benztropine  0.5 mg Oral BID  . brexpiprazole  1 mg Oral QHS  . calcitRIOL  0.25 mcg Oral Daily  . cholecalciferol  1,000 Units Oral Daily  . citalopram  20 mg Oral Daily  . docusate sodium  100 mg Oral BID  . enoxaparin (LOVENOX) injection  40 mg Subcutaneous Q24H  . famotidine  40 mg Oral Daily  . fenofibrate  54 mg Oral Daily  . fluticasone  2 spray Each Nare Daily  . furosemide  20 mg Oral Daily  . gabapentin  100 mg Oral BID WC  . gabapentin  300 mg Oral  QHS  . guaiFENesin  600 mg Oral BID  . insulin aspart  0-5 Units Subcutaneous QHS  . insulin aspart  0-9 Units Subcutaneous TID WC  . insulin glargine  24 Units Subcutaneous QHS  . ipratropium-albuterol  3 mL Nebulization QID  . levothyroxine  125 mcg Oral QAC breakfast  . linagliptin  5 mg Oral Daily  . losartan  50 mg Oral Daily  . magnesium oxide  400 mg Oral Daily  . meloxicam  15 mg Oral Daily  . metoprolol tartrate  25 mg Oral BID  . montelukast  10 mg Oral QHS  . nicotine  21 mg Transdermal Daily  . nitroGLYCERIN  0.2 mg Transdermal Daily  . predniSONE  30 mg Oral Q breakfast  . primidone  50 mg Oral BID  . Ensure Max Protein  11 oz Oral BID BM  . risperiDONE  0.5 mg Oral BID  . rosuvastatin  40 mg Oral QHS  . sodium chloride flush  3 mL Intravenous Q12H  . vitamin B-12  1,000 mcg Oral Daily    Vital Signs    Vitals:   07/17/19 1547 07/17/19 2047 07/17/19 2331 07/18/19 0747  BP: (!) 161/75  (!) 152/70 (!) 196/70  Pulse:   70 70  Resp: 19  20 15   Temp: 97.7 F (36.5 C)  97.7 F (36.5 C) 98.1 F (36.7 C)  TempSrc: Oral  Oral Oral  SpO2: 98% 97% 93% 100%  Weight:  Height:        Intake/Output Summary (Last 24 hours) at 07/18/2019 0903 Last data filed at 07/18/2019 0528 Gross per 24 hour  Intake 120 ml  Output 1350 ml  Net -1230 ml   Filed Weights   07/15/19 0103 07/17/19 0500  Weight: 73.5 kg 68.7 kg    Physical Exam    GEN: Well nourished, well developed, in no acute distress.  HEENT: normal.  Neck: Supple, no JVD, carotid bruits, or masses. Cardiac: RRR, no murmurs, rubs, or gallops. No clubbing, cyanosis, edema.  Radials/DP/PT 2+ and equal bilaterally.  Respiratory:  Respirations regular and unlabored, clear to auscultation bilaterally. GI: Soft, nontender, nondistended, BS + x 4. MS: no deformity or atrophy. Skin: warm and dry, no rash. Neuro:somewhat confused.  Labs    CBC Recent Labs    07/17/19 0505 07/18/19 0426  WBC 9.3 10.2   NEUTROABS 5.7 7.8*  HGB 9.4* 9.7*  HCT 28.5* 28.3*  MCV 94.1 89.3  PLT 270 086   Basic Metabolic Panel Recent Labs    07/17/19 0505 07/18/19 0426  NA 141 138  K 4.7 4.0  CL 108 103  CO2 26 26  GLUCOSE 143* 187*  BUN 42* 45*  CREATININE 1.38* 1.20*  CALCIUM 9.4 9.9   Liver Function Tests No results for input(s): AST, ALT, ALKPHOS, BILITOT, PROT, ALBUMIN in the last 72 hours. No results for input(s): LIPASE, AMYLASE in the last 72 hours. Cardiac Enzymes No results for input(s): CKTOTAL, CKMB, CKMBINDEX, TROPONINI in the last 72 hours. BNP No results for input(s): BNP in the last 72 hours. D-Dimer No results for input(s): DDIMER in the last 72 hours. Hemoglobin A1C No results for input(s): HGBA1C in the last 72 hours. Fasting Lipid Panel No results for input(s): CHOL, HDL, LDLCALC, TRIG, CHOLHDL, LDLDIRECT in the last 72 hours. Thyroid Function Tests No results for input(s): TSH, T4TOTAL, T3FREE, THYROIDAB in the last 72 hours.  Invalid input(s): FREET3  Telemetry    Sinus thyrhm with rbbb  ECG    nsr with rbbb and lafb  Radiology    CT HEAD WO CONTRAST  Result Date: 07/17/2019 CLINICAL DATA:  Lethargy EXAM: CT HEAD WITHOUT CONTRAST TECHNIQUE: Contiguous axial images were obtained from the base of the skull through the vertex without intravenous contrast. COMPARISON:  06/12/2019 FINDINGS: Brain: No evidence of acute infarction, hemorrhage, hydrocephalus, extra-axial collection or mass lesion/mass effect. Remote lacunar infarct at the right putamen. Cerebral volume loss with ventriculomegaly. Vascular: Atherosclerotic plaque. Skull: Normal. Negative for fracture or focal lesion. Sinuses/Orbits: No acute finding. IMPRESSION: 1. No acute or interval finding. 2. Generalized atrophy. Electronically Signed   By: Monte Fantasia M.D.   On: 07/17/2019 07:18   DG Chest Port 1 View  Result Date: 07/15/2019 CLINICAL DATA:  Respiratory distress, upper chest pain EXAM:  PORTABLE CHEST 1 VIEW COMPARISON:  06/12/2019 FINDINGS: Single frontal view of the chest demonstrates stable dual lead pacemaker. Cardiac silhouette is enlarged. There is vascular congestion with diffuse interstitial and alveolar opacities consistent with edema. Small right pleural effusion. No pneumothorax. IMPRESSION: 1. Congestive heart failure. Electronically Signed   By: Randa Ngo M.D.   On: 07/15/2019 01:07   ECHOCARDIOGRAM COMPLETE  Result Date: 07/15/2019    ECHOCARDIOGRAM REPORT   Patient Name:   Melanie King Date of Exam: 07/15/2019 Medical Rec #:  [id number]       Height:       61.0 in Accession #:    [id number]  Weight:       162.0 lb Date of Birth:  August 28, 1945       BSA:          1.727 m Patient Age:    73 years        BP:           135/59 mmHg Patient Gender: F               HR:           70 bpm. Exam Location:  [facility] Procedure: 2D Echo, Color Doppler and Cardiac Doppler Indications:     CHF- acute diastolic 428.31  History:         Patient has prior history of Echocardiogram examinations, most                  recent 03/26/2017. CHF, Pacemaker, COPD; Risk                  Factors:Hypertension and Diabetes.  Sonographer:     Cristela Blue RDCS (AE) Referring Phys:  [id number] Vernetta Honey MANSY Diagnosing Phys: Harold Hedge MD  Sonographer Comments: No apical window and no subcostal window. Image acquisition challenging due to COPD and Pt on Bipap. IMPRESSIONS  1. Left ventricular ejection fraction, by estimation, is 60 to 65%. The left ventricle has normal function. The left ventricle has no regional wall motion abnormalities. There is moderate left ventricular hypertrophy. Left ventricular diastolic parameters are consistent with Grade I diastolic dysfunction (impaired relaxation).  2. Right ventricular systolic function is normal. The right ventricular size is normal.  3. Left atrial size was mildly dilated.  4. Right atrial size was mildly dilated.  5. The mitral valve was not well visualized.  Trivial mitral valve regurgitation.  6. The aortic valve was not well visualized. Aortic valve regurgitation is trivial. Mild aortic valve sclerosis is present, with no evidence of aortic valve stenosis. FINDINGS  Left Ventricle: Left ventricular ejection fraction, by estimation, is 60 to 65%. The left ventricle has normal function. The left ventricle has no regional wall motion abnormalities. The left ventricular internal cavity size was normal in size. There is  moderate left ventricular hypertrophy. Left ventricular diastolic parameters are consistent with Grade I diastolic dysfunction (impaired relaxation). Right Ventricle: The right ventricular size is normal. No increase in right ventricular wall thickness. Right ventricular systolic function is normal. Left Atrium: Left atrial size was mildly dilated. Right Atrium: Right atrial size was mildly dilated. Pericardium: There is no evidence of pericardial effusion. Mitral Valve: The mitral valve was not well visualized. Trivial mitral valve regurgitation. Tricuspid Valve: The tricuspid valve is not well visualized. Tricuspid valve regurgitation is trivial. Aortic Valve: The aortic valve was not well visualized. Aortic valve regurgitation is trivial. Mild aortic valve sclerosis is present, with no evidence of aortic valve stenosis. Pulmonic Valve: The pulmonic valve was not well visualized. Pulmonic valve regurgitation is not visualized. Aorta: The aortic root was not well visualized. IAS/Shunts: The interatrial septum was not assessed.  LEFT VENTRICLE PLAX 2D LVIDd:         4.43 cm LVIDs:         2.93 cm LV PW:         1.05 cm LV IVS:        1.30 cm LVOT diam:     2.00 cm LVOT Area:     3.14 cm  LEFT ATRIUM         Index LA diam:  3.80 cm 2.20 cm/m                        PULMONIC VALVE AORTA                 PV Vmax:        0.83 m/s Ao Root diam: 2.00 cm PV Peak grad:   2.8 mmHg                       RVOT Peak grad: 3 mmHg   SHUNTS Systemic Diam: 2.00 cm  Harold Hedge MD Electronically signed by Harold Hedge MD Signature Date/Time: 07/15/2019/12:41:47 PM    Final     Assessment & Plan    1. Acute respiratory distress -Improved. Less sob.    2. Acute on chronic HFpEF -currently euvolemic clinicically. -Echocardiogramshowed ejection fraction of 60 to 65% with moderate LVH, aortic sclerosis with no critical AS although valve was difficult to assess. Very limited study. stable  3. Moderate to severe AS -Noted by echo several years ago.  -Echo during this visit was very limited but did not show critical AS with images available. We will continue to follow as outpatient.  4. Elevated troponin  -Borderline elevated, but flat with no significant delta; no further ischemic workup indicated during this admission   5. History of sick sinus syndrome s/p pacemaker implantation  -6.4 months battery life; continue close f/u and monitoring in an outpatient setting  6. Altered mental status  -Brain CT showed generalized atrophy with no acute or interval changes.  Modeerate risk due to comorbidity but optimized from cardiac standpoint for EGD.    Signed, Darlin Priestly Naziya Hegwood MD 07/18/2019, 9:03 AM  Pager: [phone]

## 2019-07-19 ENCOUNTER — Encounter
Admission: EM | Disposition: A | Discharge status: Skilled Nursing Facility | Payer: Self-pay | Source: Home / Self Care | Attending: Internal Medicine

## 2019-07-19 ENCOUNTER — Inpatient Hospital Stay: Payer: Medicare Other

## 2019-07-19 ENCOUNTER — Encounter: Payer: Self-pay | Admitting: Certified Registered"

## 2019-07-19 DIAGNOSIS — R1310 Dysphagia, unspecified: Secondary | ICD-10-CM

## 2019-07-19 LAB — CBC WITH DIFFERENTIAL/PLATELET
Abs Immature Granulocytes: 0.05 10*3/uL (ref 0.00–0.07)
Basophils Absolute: 0.1 10*3/uL (ref 0.0–0.1)
Basophils Relative: 1 %
Eosinophils Absolute: 0 10*3/uL (ref 0.0–0.5)
Eosinophils Relative: 0 %
HCT: 31.5 % — ABNORMAL LOW (ref 36.0–46.0)
Hemoglobin: 10.4 g/dL — ABNORMAL LOW (ref 12.0–15.0)
Immature Granulocytes: 1 %
Lymphocytes Relative: 21 %
Lymphs Abs: 2.1 10*3/uL (ref 0.7–4.0)
MCH: 29.8 pg (ref 26.0–34.0)
MCHC: 33 g/dL (ref 30.0–36.0)
MCV: 90.3 fL (ref 80.0–100.0)
Monocytes Absolute: 0.9 10*3/uL (ref 0.1–1.0)
Monocytes Relative: 9 %
Neutro Abs: 7 10*3/uL (ref 1.7–7.7)
Neutrophils Relative %: 68 %
Platelets: 353 10*3/uL (ref 150–400)
RBC: 3.49 MIL/uL — ABNORMAL LOW (ref 3.87–5.11)
RDW: 14.8 % (ref 11.5–15.5)
WBC: 10.2 10*3/uL (ref 4.0–10.5)
nRBC: 0 % (ref 0.0–0.2)

## 2019-07-19 LAB — GLUCOSE, CAPILLARY
Glucose-Capillary: 133 mg/dL — ABNORMAL HIGH (ref 70–99)
Glucose-Capillary: 128 mg/dL — ABNORMAL HIGH (ref 70–99)
Glucose-Capillary: 110 mg/dL — ABNORMAL HIGH (ref 70–99)
Glucose-Capillary: 388 mg/dL — ABNORMAL HIGH (ref 70–99)

## 2019-07-19 LAB — BASIC METABOLIC PANEL
Anion gap: 9 (ref 5–15)
BUN: 47 mg/dL — ABNORMAL HIGH (ref 8–23)
CO2: 29 mmol/L (ref 22–32)
Calcium: 10.5 mg/dL — ABNORMAL HIGH (ref 8.9–10.3)
Chloride: 99 mmol/L (ref 98–111)
Creatinine, Ser: 1.33 mg/dL — ABNORMAL HIGH (ref 0.44–1.00)
GFR calc Af Amer: 46 mL/min — ABNORMAL LOW (ref >60)
GFR calc non Af Amer: 40 mL/min — ABNORMAL LOW (ref >60)
Glucose, Bld: 138 mg/dL — ABNORMAL HIGH (ref 70–99)
Potassium: 3.8 mmol/L (ref 3.5–5.1)
Sodium: 137 mmol/L (ref 135–145)

## 2019-07-19 SURGERY — EGD (ESOPHAGOGASTRODUODENOSCOPY)
Anesthesia: General

## 2019-07-19 MED ORDER — SODIUM CHLORIDE 0.9 % IV SOLN: INTRAVENOUS | Status: DC

## 2019-07-19 NOTE — Progress Notes (Signed)
EGD on hold for now per Dr. Maximino Greenland.  Spoke with Selena Batten RN to update.

## 2019-07-19 NOTE — Progress Notes (Addendum)
Progress Note    Melanie King  OZD:[id number] DOB: [birth year]  DOA: 07/15/2019 PCP: Idelle Crouch, MD      Brief Narrative:    Medical records reviewed and are as summarized below:  Melanie King is an 74 y.o. female with a past medical history significant forcoronary artery disease s/p PCI to the RCA and LAD, sick sinus syndrome s/p permanent pacemaker insertion, HFpEF, moderate to severe aortic stenosis,type 2 diabetes, COPD, hypothyroidism, hyperlipidemia, and hypertension who presented to the ED on 07/15/19 for a month long history of worsening shortness of breath, orthopnea, and PND.      Assessment/Plan:   Active Problems:   Type 2 diabetes mellitus without complication, with long-term current use of insulin (HCC)   Acute respiratory failure (HCC)   Acute respiratory failure due to COVID-19 [facility])   Respiratory distress   Acute on chronic congestive heart failure (HCC)   Dysphagia   1. Acute respiratory failure secondary to acute on chronic diastolic CHF as well as COPD exacerbation. Continue oral Lasix.  She completed a 3-day course of azithromycin. Cor pulmonale may be playing a role in acute exacerbation of CHF.  Follow-up with cardiologist.  2. Uncontrolled type 2 diabetes mellitus with hyperglycemia and peripheral neuropathy. Continue Lantus, NovoLog and Neurontin. -SSI  3. Hypertension -continue her antihypertensives.  4.Hypothyroidism. - continue Synthroid  5. Ongoing tobacco abuse. She has been advised to quit smoking cigarettes.  6. Depression. Continue antidepressants.  7.  Dysphagia EGD was canceled because of concerns from anesthesiologist. Plan for esophagogram today. EGD may be considered if esophagogram is abnormal per gastroenterologist. Discussed with Dr. Bonna Gains, gastroenterologist  8. CKD stage IIIa Creatinine is stable.  Body mass index is 27.25 kg/m.   Family Communication/Anticipated D/C date and  plan/Code Status   DVT prophylaxis: Lovenox Code Status: Full code Family Communication: Plan discussed with patient Disposition Plan:    Status is: Inpatient  Remains inpatient appropriate because:Inpatient level of care appropriate due to severity of illness   Dispo: The patient is from: Home              Anticipated d/c is to: SNF              Anticipated d/c date is: 1 day              Patient currently is not medically stable to d/c.            Subjective:   No new complaints. She still has difficulty swallowing. No shortness of breath or chest pain  Objective:    Vitals:   07/18/19 1546 07/19/19 0005 07/19/19 0500 07/19/19 0742  BP: (!) 144/50 130/61    Pulse: 70 74    Resp: 18 15    Temp: 97.8 F (36.6 C) 97.8 F (36.6 C)    TempSrc: Oral Oral    SpO2: 94% 99%  99%  Weight:   65.4 kg   Height:       No data found.   Intake/Output Summary (Last 24 hours) at 07/19/2019 1204 Last data filed at 07/19/2019 0500 Gross per 24 hour  Intake --  Output 150 ml  Net -150 ml   Filed Weights   07/15/19 0103 07/17/19 0500 07/19/19 0500  Weight: 73.5 kg 68.7 kg 65.4 kg    Exam:  GEN: No acute distress SKIN: No rash EYES: EOMI ENT: MMM CV: RRR PULM: No rales or wheezing ABD: soft, ND, NT, +BS CNS: AAO  x 3, non focal EXT: No edema or tenderness   Data Reviewed:   I have personally reviewed following labs and imaging studies:  Labs: Labs show the following:   Basic Metabolic Panel: Recent Labs  Lab 07/15/19 0055 07/15/19 0055 07/16/19 0855 07/16/19 0855 07/17/19 0505 07/17/19 0505 07/18/19 0426 07/19/19 0647  NA 136  --  136  --  141  --  138 137  K 3.9   < > 3.3*   < > 4.7   < > 4.0 3.8  CL 106  --  101  --  108  --  103 99  CO2 20*  --  24  --  26  --  26 29  GLUCOSE 285*  --  265*  --  143*  --  187* 138*  BUN 24*  --  34*  --  42*  --  45* 47*  CREATININE 1.27*  --  1.22*  --  1.38*  --  1.20* 1.33*  CALCIUM 8.8*  --  9.0  --   9.4  --  9.9 10.5*   < > = values in this interval not displayed.   GFR Estimated Creatinine Clearance: 32.6 mL/min (A) (by C-G formula based on SCr of 1.33 mg/dL (H)). Liver Function Tests: Recent Labs  Lab 07/15/19 0055  AST 15  ALT 17  ALKPHOS 72  BILITOT 0.6  PROT 7.0  ALBUMIN 3.6   No results for input(s): LIPASE, AMYLASE in the last 168 hours. No results for input(s): AMMONIA in the last 168 hours. Coagulation profile No results for input(s): INR, PROTIME in the last 168 hours.  CBC: Recent Labs  Lab 07/15/19 0525 07/16/19 0855 07/17/19 0505 07/18/19 0426 07/19/19 0647  WBC 12.9* 15.0* 9.3 10.2 10.2  NEUTROABS 12.0* 12.5* 5.7 7.8* 7.0  HGB 10.0* 9.6* 9.4* 9.7* 10.4*  HCT 30.6* 28.8* 28.5* 28.3* 31.5*  MCV 93.6 93.5 94.1 89.3 90.3  PLT 298 305 270 297 353   Cardiac Enzymes: No results for input(s): CKTOTAL, CKMB, CKMBINDEX, TROPONINI in the last 168 hours. BNP (last 3 results) No results for input(s): PROBNP in the last 8760 hours. CBG: Recent Labs  Lab 07/18/19 0752 07/18/19 1215 07/18/19 1625 07/18/19 2038 07/19/19 0810  GLUCAP 136* 240* 388* 310* 133*   D-Dimer: No results for input(s): DDIMER in the last 72 hours. Hgb A1c: No results for input(s): HGBA1C in the last 72 hours. Lipid Profile: No results for input(s): CHOL, HDL, LDLCALC, TRIG, CHOLHDL, LDLDIRECT in the last 72 hours. Thyroid function studies: No results for input(s): TSH, T4TOTAL, T3FREE, THYROIDAB in the last 72 hours.  Invalid input(s): FREET3 Anemia work up: No results for input(s): VITAMINB12, FOLATE, FERRITIN, TIBC, IRON, RETICCTPCT in the last 72 hours. Sepsis Labs: Recent Labs  Lab 07/15/19 0055 07/15/19 0055 07/15/19 0339 07/15/19 0525 07/15/19 0525 07/16/19 0855 07/17/19 0505 07/18/19 0426 07/19/19 0647  PROCALCITON  --   --  <0.10  --   --   --   --   --   --   WBC 12.0*   < >  --  12.9*   < > 15.0* 9.3 10.2 10.2  LATICACIDVEN 1.5  --   --  3.0*  --   --   --    --   --    < > = values in this interval not displayed.    Microbiology Recent Results (from the past 240 hour(s))  SARS Coronavirus 2 by RT PCR (hospital order, performed in  [facility] hospital lab) Nasopharyngeal Nasopharyngeal Swab     Status: None   Collection Time: 07/15/19  1:11 AM   Specimen: Nasopharyngeal Swab  Result Value Ref Range Status   SARS Coronavirus 2 NEGATIVE NEGATIVE Final    Comment: (NOTE) SARS-CoV-2 target nucleic acids are NOT DETECTED. The SARS-CoV-2 RNA is generally detectable in upper and lower respiratory specimens during the acute phase of infection. The lowest concentration of SARS-CoV-2 viral copies this assay can detect is 250 copies / mL. A negative result does not preclude SARS-CoV-2 infection and should not be used as the sole basis for treatment or other patient management decisions.  A negative result may occur with improper specimen collection / handling, submission of specimen other than nasopharyngeal swab, presence of viral mutation(s) within the areas targeted by this assay, and inadequate number of viral copies (<250 copies / mL). A negative result must be combined with clinical observations, patient history, and epidemiological information. Fact Sheet for Patients:   BoilerBrush.com.cy Fact Sheet for Healthcare Providers: https://pope.com/ This test is not yet approved or cleared  by the [place] FDA and has been authorized for detection and/or diagnosis of SARS-CoV-2 by FDA under an Emergency Use Authorization (EUA).  This EUA will remain in effect (meaning this test can be used) for the duration of the COVID-19 declaration under Section 564(b)(1) of the Act, 21 U.S.C. section 360bbb-3(b)(1), unless the authorization is terminated or revoked sooner. Performed at [facility], [address] [place], [place] [address]   Culture, blood (routine x 2)     Status: None  (Preliminary result)   Collection Time: 07/15/19  2:32 AM   Specimen: BLOOD LEFT HAND  Result Value Ref Range Status   Specimen Description BLOOD LEFT HAND  Final   Special Requests   Final    BOTTLES DRAWN AEROBIC AND ANAEROBIC Blood Culture adequate volume   Culture   Final    NO GROWTH 4 DAYS Performed at [facility], [address], [place], [place] [address]    Report Status PENDING  Incomplete  Culture, blood (routine x 2)     Status: None (Preliminary result)   Collection Time: 07/15/19  2:32 AM   Specimen: Left Antecubital; Blood  Result Value Ref Range Status   Specimen Description LEFT ANTECUBITAL  Final   Special Requests   Final    BOTTLES DRAWN AEROBIC AND ANAEROBIC Blood Culture adequate volume   Culture   Final    NO GROWTH 4 DAYS Performed at [facility], [address] [place], [place] [address]    Report Status PENDING  Incomplete  SARS Coronavirus 2 by RT PCR (hospital order, performed in [facility] hospital lab) Nasopharyngeal Nasopharyngeal Swab     Status: None   Collection Time: 07/17/19 11:08 AM   Specimen: Nasopharyngeal Swab  Result Value Ref Range Status   SARS Coronavirus 2 NEGATIVE NEGATIVE Final    Comment: (NOTE) SARS-CoV-2 target nucleic acids are NOT DETECTED. The SARS-CoV-2 RNA is generally detectable in upper and lower respiratory specimens during the acute phase of infection. The lowest concentration of SARS-CoV-2 viral copies this assay can detect is 250 copies / mL. A negative result does not preclude SARS-CoV-2 infection and should not be used as the sole basis for treatment or other patient management decisions.  A negative result may occur with improper specimen collection / handling, submission of specimen other than nasopharyngeal swab, presence of viral mutation(s) within the areas targeted by this assay,  and inadequate number of viral copies (<250 copies / mL). A negative result must be combined with  clinical observations, patient history, and epidemiological information. Fact Sheet for Patients:   BoilerBrush.com.cy Fact Sheet for Healthcare Providers: https://pope.com/ This test is not yet approved or cleared  by the [place] FDA and has been authorized for detection and/or diagnosis of SARS-CoV-2 by FDA under an Emergency Use Authorization (EUA).  This EUA will remain in effect (meaning this test can be used) for the duration of the COVID-19 declaration under Section 564(b)(1) of the Act, 21 U.S.C. section 360bbb-3(b)(1), unless the authorization is terminated or revoked sooner. Performed at [facility], [address] [place], [place] [address]     Procedures and diagnostic studies:  No results found.  Medications:   . amLODipine  10 mg Oral Daily  . aspirin EC  81 mg Oral Daily  . benztropine  0.5 mg Oral BID  . brexpiprazole  1 mg Oral QHS  . calcitRIOL  0.25 mcg Oral Daily  . cholecalciferol  1,000 Units Oral Daily  . citalopram  20 mg Oral Daily  . docusate sodium  100 mg Oral BID  . enoxaparin (LOVENOX) injection  40 mg Subcutaneous Q24H  . famotidine  40 mg Oral Daily  . fenofibrate  54 mg Oral Daily  . fluticasone  2 spray Each Nare Daily  . furosemide  20 mg Oral Daily  . gabapentin  100 mg Oral BID WC  . gabapentin  300 mg Oral QHS  . guaiFENesin  600 mg Oral BID  . insulin aspart  0-5 Units Subcutaneous QHS  . insulin aspart  0-9 Units Subcutaneous TID WC  . insulin glargine  24 Units Subcutaneous QHS  . ipratropium-albuterol  3 mL Nebulization TID  . levothyroxine  125 mcg Oral QAC breakfast  . linagliptin  5 mg Oral Daily  . losartan  50 mg Oral Daily  . magnesium oxide  400 mg Oral Daily  . meloxicam  15 mg Oral Daily  . metoprolol tartrate  25 mg Oral BID  . montelukast  10 mg Oral QHS  . nicotine  21 mg Transdermal Daily  . nitroGLYCERIN  0.2 mg Transdermal Daily  . predniSONE   30 mg Oral Q breakfast  . primidone  50 mg Oral BID  . Ensure Max Protein  11 oz Oral BID BM  . risperiDONE  0.5 mg Oral BID  . rosuvastatin  40 mg Oral QHS  . sodium chloride flush  3 mL Intravenous Q12H  . vitamin B-12  1,000 mcg Oral Daily   Continuous Infusions: . sodium chloride    . sodium chloride Stopped (07/19/19 0800)     LOS: 4 days   Nishka Heide  [facility]     07/19/2019, 12:04 PM

## 2019-07-19 NOTE — Progress Notes (Signed)
Anesthesia, Dr. Henrene Hawking reviewed the patient's chart and due to patient's history of Aortic stenosis, with current Echo being a limited exam and Cardiology reporting moderate risk in their note, would only like to proceed with EGD if benefit outweighs risk. I spoke to the patient and she reports dysphagia to solids intermittently for 7-8 months but not every time she eats. Spoke to hospitalist Dr. Myriam Forehand as well, and Esophagram first would help evaluate for any large lesions and if abnormal benefit would outweigh risks for EGD. Discussed risks and benefits of EGD with pt as well and she prefers to proceed with less invasive testing first as well.

## 2019-07-19 NOTE — Progress Notes (Signed)
Pt pulled out midline covered as policy IV team consulted.

## 2019-07-19 NOTE — Progress Notes (Signed)
Occupational Therapy Treatment Patient Details Name: Melanie King MRN: [id number] DOB: [birth year] Today's Date: 07/19/2019    History of present illness Pt. is a 73yoF PMH: CHF, COPD, CAD, DM2, depression and GERD, who presented to the emergency room with acute onset of worsening dyspnea with associated dry cough and wheezing, orthopnea and paroxysmal nocturnal dyspnea.   OT comments  Pt. was assisted with repositioning for comfort. Pt. was able to engage her bilateral hands during self-grooming. Pt. education was provided [facility] tasks briefly before transport arrived to take pt. for testing. Pt. could benefit from assessing the potential need for A/E for self-feeding as pt. reports frequent spillage during meals, and occasional dropping of utensils from her hand. Pt. continues to benefit from OT services for ADL training, A/E training, and pt. education about home modification, and DME. Discharge disposition remains appropriate.    Follow Up Recommendations  SNF;Supervision - Intermittent    Equipment Recommendations  3 in 1 bedside commode;Tub/shower seat    Recommendations for Other Services      Precautions / Restrictions Precautions Precautions: Fall Restrictions Weight Bearing Restrictions: No       Mobility Bed Mobility   Bed Mobility: Supine to Sit     Supine to sit: Min guard Sit to supine: Supervision      Transfers Overall transfer level: Needs assistance Equipment used: Rolling walker (2 wheeled) Transfers: Sit to/from Stand Sit to Stand: Min assist              Balance                                           ADL either performed or assessed with clinical judgement   ADL Overall ADL's : Needs assistance/impaired                                       General ADL Comments: Pt. requires set-up, and assist to open containers, and packets. Reports occasional spillage secondary to limited [facility]. Independent with  self-grooming. MinA LE ADLs     Vision Baseline Vision/History: Wears glasses Wears Glasses: At all times Patient Visual Report: No change from baseline     Perception     Praxis      Cognition Arousal/Alertness: Awake/alert Behavior During Therapy: Impulsive Overall Cognitive Status: Impaired/Different from baseline Area of Impairment: Following commands;Safety/judgement;Problem solving                       Following Commands: Follows one step commands consistently Safety/Judgement: Decreased awareness of safety              Exercises     Shoulder Instructions       General Comments      Pertinent Vitals/ Pain       Pain Assessment: No/denies pain  Home Living                                          Prior Functioning/Environment              Frequency  Min 2X/week        Progress Toward Goals  OT Goals(current goals can now be found in  the care plan section)  Progress towards OT goals: Progressing toward goals  Acute Rehab OT Goals Patient Stated Goal: To get better OT Goal Formulation: With patient/family Time For Goal Achievement: 07/30/19 Potential to Achieve Goals: Good  Plan      Co-evaluation                 AM-PAC OT "6 Clicks" Daily Activity     Outcome Measure   Help from another person eating meals?: A Little Help from another person taking care of personal grooming?: None Help from another person toileting, which includes using toliet, bedpan, or urinal?: A Little Help from another person bathing (including washing, rinsing, drying)?: A Little Help from another person to put on and taking off regular upper body clothing?: None Help from another person to put on and taking off regular lower body clothing?: A Little 6 Click Score: 20    End of Session    OT Visit Diagnosis: Unsteadiness on feet (R26.81);Muscle weakness (generalized) (M62.81)   Activity Tolerance Patient tolerated  treatment well   Patient Left in chair;with call bell/phone within reach;with chair alarm set   Nurse Communication          Time: 1610-9604 OT Time Calculation (min): 18 min  Charges: OT General Charges $OT Visit: 1 Visit OT Treatments $Self Care/Home Management : 8-22 mins  Harrel Carina, MS, OTR/L  Harrel Carina 07/19/2019, 2:01 PM

## 2019-07-19 NOTE — Progress Notes (Signed)
Vonda Antigua, MD [address] [place], [place], [place], [address] [address], [place], [place], [place], [address] Phone: [phone]  Fax: [phone]   Subjective:  See note from earlier today in regard to discussion with anesthesia.  Objective: Exam: Vital signs in last 24 hours: Vitals:   07/19/19 0500 07/19/19 0742 07/19/19 1300 07/19/19 1600  BP:   (!) 174/89 (!) 160/81  Pulse:      Resp:   14 14  Temp:    98 F (36.7 C)  TempSrc:    Oral  SpO2:  99%  99%  Weight: 65.4 kg     Height:       Weight change:   Intake/Output Summary (Last 24 hours) at 07/19/2019 1933 Last data filed at 07/19/2019 1853 Gross per 24 hour  Intake 480 ml  Output 2150 ml  Net -1670 ml    General: No acute distress, AAO x3 Abd: Soft, NT/ND, No HSM Skin: Warm, no rashes Neck: Supple, Trachea midline   Lab Results: Lab Results  Component Value Date   WBC 10.2 07/19/2019   HGB 10.4 (L) 07/19/2019   HCT 31.5 (L) 07/19/2019   MCV 90.3 07/19/2019   PLT 353 07/19/2019   Micro Results: Recent Results (from the past 240 hour(s))  SARS Coronavirus 2 by RT PCR (hospital order, performed in [place] hospital lab) Nasopharyngeal Nasopharyngeal Swab     Status: None   Collection Time: 07/15/19  1:11 AM   Specimen: Nasopharyngeal Swab  Result Value Ref Range Status   SARS Coronavirus 2 NEGATIVE NEGATIVE Final    Comment: (NOTE) SARS-CoV-2 target nucleic acids are NOT DETECTED. The SARS-CoV-2 RNA is generally detectable in upper and lower respiratory specimens during the acute phase of infection. The lowest concentration of SARS-CoV-2 viral copies this assay can detect is 250 copies / mL. A negative result does not preclude SARS-CoV-2 infection and should not be used as the sole basis for treatment or other patient management decisions.  A negative result may occur with improper specimen collection / handling, submission of specimen other than nasopharyngeal swab,  presence of viral mutation(s) within the areas targeted by this assay, and inadequate number of viral copies (<250 copies / mL). A negative result must be combined with clinical observations, patient history, and epidemiological information. Fact Sheet for Patients:   StrictlyIdeas.no Fact Sheet for Healthcare Providers: BankingDealers.co.za This test is not yet approved or cleared  by the [place] FDA and has been authorized for detection and/or diagnosis of SARS-CoV-2 by FDA under an Emergency Use Authorization (EUA).  This EUA will remain in effect (meaning this test can be used) for the duration of the COVID-19 declaration under Section 564(b)(1) of the Act, 21 U.S.C. section 360bbb-3(b)(1), unless the authorization is terminated or revoked sooner. Performed at [facility], [address] [place], [place] [address]   Culture, blood (routine x 2)     Status: None (Preliminary result)   Collection Time: 07/15/19  2:32 AM   Specimen: BLOOD LEFT HAND  Result Value Ref Range Status   Specimen Description BLOOD LEFT HAND  Final   Special Requests   Final    BOTTLES DRAWN AEROBIC AND ANAEROBIC Blood Culture adequate volume   Culture   Final    NO GROWTH 4 DAYS Performed at [facility], [address] [place], [place] [address]    Report Status PENDING  Incomplete  Culture, blood (routine x 2)     Status: None (  Preliminary result)   Collection Time: 07/15/19  2:32 AM   Specimen: Left Antecubital; Blood  Result Value Ref Range Status   Specimen Description LEFT ANTECUBITAL  Final   Special Requests   Final    BOTTLES DRAWN AEROBIC AND ANAEROBIC Blood Culture adequate volume   Culture   Final    NO GROWTH 4 DAYS Performed at [facility], [address] [place], [place] [address]    Report Status PENDING  Incomplete  SARS Coronavirus 2 by RT PCR (hospital order, performed in [facility] hospital  lab) Nasopharyngeal Nasopharyngeal Swab     Status: None   Collection Time: 07/17/19 11:08 AM   Specimen: Nasopharyngeal Swab  Result Value Ref Range Status   SARS Coronavirus 2 NEGATIVE NEGATIVE Final    Comment: (NOTE) SARS-CoV-2 target nucleic acids are NOT DETECTED. The SARS-CoV-2 RNA is generally detectable in upper and lower respiratory specimens during the acute phase of infection. The lowest concentration of SARS-CoV-2 viral copies this assay can detect is 250 copies / mL. A negative result does not preclude SARS-CoV-2 infection and should not be used as the sole basis for treatment or other patient management decisions.  A negative result may occur with improper specimen collection / handling, submission of specimen other than nasopharyngeal swab, presence of viral mutation(s) within the areas targeted by this assay, and inadequate number of viral copies (<250 copies / mL). A negative result must be combined with clinical observations, patient history, and epidemiological information. Fact Sheet for Patients:   BoilerBrush.com.cy Fact Sheet for Healthcare Providers: https://pope.com/ This test is not yet approved or cleared  by the [place] FDA and has been authorized for detection and/or diagnosis of SARS-CoV-2 by FDA under an Emergency Use Authorization (EUA).  This EUA will remain in effect (meaning this test can be used) for the duration of the COVID-19 declaration under Section 564(b)(1) of the Act, 21 U.S.C. section 360bbb-3(b)(1), unless the authorization is terminated or revoked sooner. Performed at [facility], [address] [place], [place] [address]    Studies/Results: [place] ESOPHAGUS W SINGLE CM (SOL OR THIN BA)  Result Date: 07/19/2019 CLINICAL DATA:  Dysphagia with solids for 6 months EXAM: ESOPHOGRAM/BARIUM SWALLOW TECHNIQUE: Single contrast examination was performed using  thin barium.  FLUOROSCOPY TIME:  Fluoroscopy Time:  0.7 minutes Radiation Exposure Index (if provided by the fluoroscopic device): 13.1 mGy Number of Acquired Spot Images: 13 COMPARISON:  None. FINDINGS: Decreased primary peristaltic wave with intermittent tertiary contractions. Esophagus distends normally. No persisting stricture. No discrete mass or ulceration. Spontaneous gastroesophageal reflux to the midesophagus. Small hiatal hernia. IMPRESSION: 1. No evidence for esophageal stricture. 2. Mild esophageal dysmotility. 3. Gastroesophageal reflux. Electronically Signed   By: Duanne Guess D.O.   On: 07/19/2019 13:01   Medications:  Scheduled Meds: . amLODipine  10 mg Oral Daily  . aspirin EC  81 mg Oral Daily  . benztropine  0.5 mg Oral BID  . brexpiprazole  1 mg Oral QHS  . calcitRIOL  0.25 mcg Oral Daily  . cholecalciferol  1,000 Units Oral Daily  . citalopram  20 mg Oral Daily  . docusate sodium  100 mg Oral BID  . enoxaparin (LOVENOX) injection  40 mg Subcutaneous Q24H  . famotidine  40 mg Oral Daily  . fenofibrate  54 mg Oral Daily  . fluticasone  2 spray Each Nare Daily  . furosemide  20 mg Oral Daily  . gabapentin  100 mg Oral BID WC  .  gabapentin  300 mg Oral QHS  . guaiFENesin  600 mg Oral BID  . insulin aspart  0-5 Units Subcutaneous QHS  . insulin aspart  0-9 Units Subcutaneous TID WC  . insulin glargine  24 Units Subcutaneous QHS  . ipratropium-albuterol  3 mL Nebulization TID  . levothyroxine  125 mcg Oral QAC breakfast  . linagliptin  5 mg Oral Daily  . losartan  50 mg Oral Daily  . magnesium oxide  400 mg Oral Daily  . meloxicam  15 mg Oral Daily  . metoprolol tartrate  25 mg Oral BID  . montelukast  10 mg Oral QHS  . nicotine  21 mg Transdermal Daily  . nitroGLYCERIN  0.2 mg Transdermal Daily  . predniSONE  30 mg Oral Q breakfast  . primidone  50 mg Oral BID  . Ensure Max Protein  11 oz Oral BID BM  . risperiDONE  0.5 mg Oral BID  . rosuvastatin  40 mg Oral QHS  . sodium  chloride flush  3 mL Intravenous Q12H  . vitamin B-12  1,000 mcg Oral Daily   Continuous Infusions: . sodium chloride    . sodium chloride Stopped (07/19/19 0800)   PRN Meds:.sodium chloride, acetaminophen, chlorpheniramine-HYDROcodone, hydrOXYzine, ondansetron (ZOFRAN) IV, sodium chloride flush, traMADol, zolpidem   Assessment: Active Problems:   Type 2 diabetes mellitus without complication, with long-term current use of insulin (HCC)   Acute respiratory failure (HCC)   Acute respiratory failure due to COVID-19 [facility])   Respiratory distress   Acute on chronic congestive heart failure (HCC)   Dysphagia    Plan: Esophagram unrevealing of any etiology of dysphagia, no obstruction or masses identified.  Would recommend speech and swallow evaluation to assess for oropharyngeal dysphagia   LOS: 4 days   Melodie Bouillon, MD 07/19/2019, 7:33 PM

## 2019-07-20 LAB — CBC WITH DIFFERENTIAL/PLATELET
Abs Immature Granulocytes: 0.04 10*3/uL (ref 0.00–0.07)
Basophils Absolute: 0 10*3/uL (ref 0.0–0.1)
Basophils Relative: 0 %
Eosinophils Absolute: 0 10*3/uL (ref 0.0–0.5)
Eosinophils Relative: 0 %
HCT: 30.2 % — ABNORMAL LOW (ref 36.0–46.0)
Hemoglobin: 10.3 g/dL — ABNORMAL LOW (ref 12.0–15.0)
Immature Granulocytes: 0 %
Lymphocytes Relative: 17 %
Lymphs Abs: 1.7 10*3/uL (ref 0.7–4.0)
MCH: 30.3 pg (ref 26.0–34.0)
MCHC: 34.1 g/dL (ref 30.0–36.0)
MCV: 88.8 fL (ref 80.0–100.0)
Monocytes Absolute: 0.9 10*3/uL (ref 0.1–1.0)
Monocytes Relative: 9 %
Neutro Abs: 7.6 10*3/uL (ref 1.7–7.7)
Neutrophils Relative %: 74 %
Platelets: 334 10*3/uL (ref 150–400)
RBC: 3.4 MIL/uL — ABNORMAL LOW (ref 3.87–5.11)
RDW: 14.6 % (ref 11.5–15.5)
WBC: 10.2 10*3/uL (ref 4.0–10.5)
nRBC: 0 % (ref 0.0–0.2)

## 2019-07-20 LAB — CULTURE, BLOOD (ROUTINE X 2)
Culture: NO GROWTH
Culture: NO GROWTH
Special Requests: ADEQUATE
Special Requests: ADEQUATE

## 2019-07-20 LAB — BASIC METABOLIC PANEL
Anion gap: 9 (ref 5–15)
BUN: 51 mg/dL — ABNORMAL HIGH (ref 8–23)
CO2: 24 mmol/L (ref 22–32)
Calcium: 9.6 mg/dL (ref 8.9–10.3)
Chloride: 101 mmol/L (ref 98–111)
Creatinine, Ser: 1.5 mg/dL — ABNORMAL HIGH (ref 0.44–1.00)
GFR calc Af Amer: 40 mL/min — ABNORMAL LOW (ref >60)
GFR calc non Af Amer: 34 mL/min — ABNORMAL LOW (ref >60)
Glucose, Bld: 291 mg/dL — ABNORMAL HIGH (ref 70–99)
Potassium: 4.4 mmol/L (ref 3.5–5.1)
Sodium: 134 mmol/L — ABNORMAL LOW (ref 135–145)

## 2019-07-20 LAB — GLUCOSE, CAPILLARY
Glucose-Capillary: 251 mg/dL — ABNORMAL HIGH (ref 70–99)
Glucose-Capillary: 182 mg/dL — ABNORMAL HIGH (ref 70–99)

## 2019-07-20 MED ORDER — ENOXAPARIN SODIUM 30 MG/0.3ML ~~LOC~~ SOLN
30.0000 mg | SUBCUTANEOUS | Status: DC
  Administered 2019-07-20: 30 mg via SUBCUTANEOUS
  Filled 2019-07-20: qty 0.3

## 2019-07-20 MED ORDER — FAMOTIDINE 20 MG PO TABS
20.0000 mg | ORAL_TABLET | Freq: Every day | ORAL | Status: DC
  Administered 2019-07-20: 20 mg via ORAL
  Filled 2019-07-20: qty 1

## 2019-07-20 MED ORDER — TRAMADOL HCL 50 MG PO TABS: 50.0000 mg | ORAL_TABLET | Freq: Two times a day (BID) | ORAL | 0 refills | Status: DC | PRN

## 2019-07-20 NOTE — TOC Transition Note (Signed)
Transition of Care [facility]) - CM/SW Discharge Note   Patient Details  Name: Melanie King MRN: [id number] Date of Birth: 05/01/1945  Transition of Care [facility]) CM/SW Contact:  Maud Deed, LCSW Phone Number: 07/20/2019, 11:33 AM   Clinical Narrative:    Pt medically stable for discharge per MD. Pt will be transported via EMS to [facility]. CSW contacted [employer] and pt's Renato Battles to notify of discharge. Call to report number is [phone].   Final next level of care: Skilled Nursing Facility Barriers to Discharge: No Barriers Identified   Patient Goals and CMS Choice Patient states their goals for this hospitalization and ongoing recovery are:: I plan to go home and once I have my surgeries I plan to go to Assisted Living facility CMS Medicare.gov Compare Post Acute Care list provided to:: Patient Choice offered to / list presented to : Patient  Discharge Placement              Patient chooses bed at: [facility] Patient to be transferred to facility by: EMS Name of family member notified: [name] Patient and family notified of of transfer: 07/20/19  Discharge Plan and Services In-house Referral: Clinical Social Work   Post Acute Care Choice: Home Health, Durable Medical Equipment                               Social Determinants of Health (SDOH) Interventions     Readmission Risk Interventions No flowsheet data found.

## 2019-07-20 NOTE — Discharge Summary (Signed)
Physician Discharge Summary  Melanie King IOX:[id number] DOB: May 12, 1945 DOA: 07/15/2019  PCP: Idelle Crouch, MD  Admit date: 07/15/2019 Discharge date: 07/20/2019  Discharge disposition: Skilled nursing facility   Recommendations for Outpatient Follow-Up:   Recommend swallow evaluation by speech therapist as an outpatient Follow up with physician at nursing home within 3 days of discharge Outpatient follow up with gastroenterologist for evaluation of dysphagia Monitor BMP closel    Discharge Diagnosis:   Active Problems:   Type 2 diabetes mellitus without complication, with long-term current use of insulin ([place])   Acute respiratory failure ([place])   Acute respiratory failure due to COVID-19 [facility])   Respiratory distress   Acute on chronic congestive heart failure ([place])   Dysphagia    Discharge Condition: Stable.  Diet recommendation: Low-salt and diabetic diet  Code status: Full code.    Hospital Course:   [name] is an 74 y.o. female with a past medical history significant forcoronary artery disease s/p PCI to the RCA and LAD, sick sinus syndrome s/p permanent pacemaker insertion, HFpEF, moderate to severe aortic stenosis,type 2 diabetes, COPD, hypothyroidism, hyperlipidemia, and hypertension who presented to the ED on 07/15/19 for a month long history of worsening shortness of breath, orthopnea, and PND. She was admitted to the hospital for acute hypoxemic respiratory failure secondary to acute dissipation of chronic diastolic CHF and COPD exacerbation. She was treated with IV Lasix, empiric antibiotics and steroids. Cardiologist was consulted to assist with management.  She also complained of longstanding difficulty swallowing and gastroenterologist was consulted for further evaluation. Initially, EGD was planned but this was aborted because of patient's multiple comorbidities. Gastroenterologist recommended proceeding with esophagogram first, and to  perform an EGD if esophagogram was abnormal. Esophagogram was unremarkable. Evaluation by speech therapist was recommended. This can be done as an outpatient since speech therapist is not available on weekends.  Her condition has improved and she is deemed stable for discharge. She was seen by PT and OT who recommended further rehabilitation at the skilled nursing facility. She has been advised to quit smoking cigarettes.  Medical Consultants:    Cardiologist, Dr. Ubaldo Glassing  Gastroenterologist, Dr. Bonna Gains   Discharge Exam:   Vitals:   07/20/19 0018 07/20/19 0733  BP: (!) 115/49 (!) 163/71  Pulse: 79 70  Resp: 13 16  Temp: 97.8 F (36.6 C) 97.9 F (36.6 C)  SpO2: 98% 97%   Vitals:   07/19/19 2047 07/20/19 0018 07/20/19 0500 07/20/19 0733  BP:  (!) 115/49  (!) 163/71  Pulse:  79  70  Resp:  13  16  Temp:  97.8 F (36.6 C)  97.9 F (36.6 C)  TempSrc:  Oral    SpO2: 96% 98%  97%  Weight:   66.3 kg   Height:         GEN: NAD SKIN: No rash EYES: EOMI ENT: MMM CV: RRR PULM: CTA B ABD: soft, ND, NT, +BS CNS: AAO x 3, non focal EXT: No edema or tenderness   The results of significant diagnostics from this hospitalization (including imaging, microbiology, ancillary and laboratory) are listed below for reference.     Procedures and Diagnostic Studies:   DG Chest Port 1 View  Result Date: 07/15/2019 CLINICAL DATA:  Respiratory distress, upper chest pain EXAM: PORTABLE CHEST 1 VIEW COMPARISON:  06/12/2019 FINDINGS: Single frontal view of the chest demonstrates stable dual lead pacemaker. Cardiac silhouette is enlarged. There is vascular congestion with diffuse interstitial and alveolar  opacities consistent with edema. Small right pleural effusion. No pneumothorax. IMPRESSION: 1. Congestive heart failure. Electronically Signed   By: Sharlet Salina M.D.   On: 07/15/2019 01:07   ECHOCARDIOGRAM COMPLETE  Result Date: 07/15/2019    ECHOCARDIOGRAM REPORT   Patient Name:    Melanie King Date of Exam: 07/15/2019 Medical Rec #:  [id number]       Height:       61.0 in Accession #:    [id number]      Weight:       162.0 lb Date of Birth:  October 29, 1945       BSA:          1.727 m Patient Age:    73 years        BP:           135/59 mmHg Patient Gender: F               HR:           70 bpm. Exam Location:  [facility] Procedure: 2D Echo, Color Doppler and Cardiac Doppler Indications:     CHF- acute diastolic 428.31  History:         Patient has prior history of Echocardiogram examinations, most                  recent 03/26/2017. CHF, Pacemaker, COPD; Risk                  Factors:Hypertension and Diabetes.  Sonographer:     Cristela Blue RDCS (AE) Referring Phys:  [id number] Vernetta Honey MANSY Diagnosing Phys: Harold Hedge MD  Sonographer Comments: No apical window and no subcostal window. Image acquisition challenging due to COPD and Pt on Bipap. IMPRESSIONS  1. Left ventricular ejection fraction, by estimation, is 60 to 65%. The left ventricle has normal function. The left ventricle has no regional wall motion abnormalities. There is moderate left ventricular hypertrophy. Left ventricular diastolic parameters are consistent with Grade I diastolic dysfunction (impaired relaxation).  2. Right ventricular systolic function is normal. The right ventricular size is normal.  3. Left atrial size was mildly dilated.  4. Right atrial size was mildly dilated.  5. The mitral valve was not well visualized. Trivial mitral valve regurgitation.  6. The aortic valve was not well visualized. Aortic valve regurgitation is trivial. Mild aortic valve sclerosis is present, with no evidence of aortic valve stenosis. FINDINGS  Left Ventricle: Left ventricular ejection fraction, by estimation, is 60 to 65%. The left ventricle has normal function. The left ventricle has no regional wall motion abnormalities. The left ventricular internal cavity size was normal in size. There is  moderate left ventricular hypertrophy. Left ventricular  diastolic parameters are consistent with Grade I diastolic dysfunction (impaired relaxation). Right Ventricle: The right ventricular size is normal. No increase in right ventricular wall thickness. Right ventricular systolic function is normal. Left Atrium: Left atrial size was mildly dilated. Right Atrium: Right atrial size was mildly dilated. Pericardium: There is no evidence of pericardial effusion. Mitral Valve: The mitral valve was not well visualized. Trivial mitral valve regurgitation. Tricuspid Valve: The tricuspid valve is not well visualized. Tricuspid valve regurgitation is trivial. Aortic Valve: The aortic valve was not well visualized. Aortic valve regurgitation is trivial. Mild aortic valve sclerosis is present, with no evidence of aortic valve stenosis. Pulmonic Valve: The pulmonic valve was not well visualized. Pulmonic valve regurgitation is not visualized. Aorta: The aortic root was not well visualized. IAS/Shunts:  The interatrial septum was not assessed.  LEFT VENTRICLE PLAX 2D LVIDd:         4.43 cm LVIDs:         2.93 cm LV PW:         1.05 cm LV IVS:        1.30 cm LVOT diam:     2.00 cm LVOT Area:     3.14 cm  LEFT ATRIUM         Index LA diam:    3.80 cm 2.20 cm/m                        PULMONIC VALVE AORTA                 PV Vmax:        0.83 m/s Ao Root diam: 2.00 cm PV Peak grad:   2.8 mmHg                       RVOT Peak grad: 3 mmHg   SHUNTS Systemic Diam: 2.00 cm Harold Hedge MD Electronically signed by Harold Hedge MD Signature Date/Time: 07/15/2019/12:41:47 PM    Final      Labs:   Basic Metabolic Panel: Recent Labs  Lab 07/16/19 0855 07/16/19 0855 07/17/19 0505 07/17/19 0505 07/18/19 0426 07/18/19 0426 07/19/19 0647 07/20/19 0628  NA 136  --  141  --  138  --  137 134*  K 3.3*   < > 4.7   < > 4.0   < > 3.8 4.4  CL 101  --  108  --  103  --  99 101  CO2 24  --  26  --  26  --  29 24  GLUCOSE 265*  --  143*  --  187*  --  138* 291*  BUN 34*  --  42*  --  45*  --   47* 51*  CREATININE 1.22*  --  1.38*  --  1.20*  --  1.33* 1.50*  CALCIUM 9.0  --  9.4  --  9.9  --  10.5* 9.6   < > = values in this interval not displayed.   GFR Estimated Creatinine Clearance: 29.1 mL/min (A) (by C-G formula based on SCr of 1.5 mg/dL (H)). Liver Function Tests: Recent Labs  Lab 07/15/19 0055  AST 15  ALT 17  ALKPHOS 72  BILITOT 0.6  PROT 7.0  ALBUMIN 3.6   No results for input(s): LIPASE, AMYLASE in the last 168 hours. No results for input(s): AMMONIA in the last 168 hours. Coagulation profile No results for input(s): INR, PROTIME in the last 168 hours.  CBC: Recent Labs  Lab 07/16/19 0855 07/17/19 0505 07/18/19 0426 07/19/19 0647 07/20/19 0628  WBC 15.0* 9.3 10.2 10.2 10.2  NEUTROABS 12.5* 5.7 7.8* 7.0 7.6  HGB 9.6* 9.4* 9.7* 10.4* 10.3*  HCT 28.8* 28.5* 28.3* 31.5* 30.2*  MCV 93.5 94.1 89.3 90.3 88.8  PLT 305 270 297 353 334   Cardiac Enzymes: No results for input(s): CKTOTAL, CKMB, CKMBINDEX, TROPONINI in the last 168 hours. BNP: Invalid input(s): POCBNP CBG: Recent Labs  Lab 07/19/19 0810 07/19/19 1300 07/19/19 1638 07/19/19 2048 07/20/19 0732  GLUCAP 133* 128* 110* 388* 251*   D-Dimer No results for input(s): DDIMER in the last 72 hours. Hgb A1c No results for input(s): HGBA1C in the last 72 hours. Lipid Profile No results for input(s): CHOL, HDL, LDLCALC,  TRIG, CHOLHDL, LDLDIRECT in the last 72 hours. Thyroid function studies No results for input(s): TSH, T4TOTAL, T3FREE, THYROIDAB in the last 72 hours.  Invalid input(s): FREET3 Anemia work up No results for input(s): VITAMINB12, FOLATE, FERRITIN, TIBC, IRON, RETICCTPCT in the last 72 hours. Microbiology Recent Results (from the past 240 hour(s))  SARS Coronavirus 2 by RT PCR (hospital order, performed in [facility] hospital lab) Nasopharyngeal Nasopharyngeal Swab     Status: None   Collection Time: 07/15/19  1:11 AM   Specimen: Nasopharyngeal Swab  Result Value Ref Range  Status   SARS Coronavirus 2 NEGATIVE NEGATIVE Final    Comment: (NOTE) SARS-CoV-2 target nucleic acids are NOT DETECTED. The SARS-CoV-2 RNA is generally detectable in upper and lower respiratory specimens during the acute phase of infection. The lowest concentration of SARS-CoV-2 viral copies this assay can detect is 250 copies / mL. A negative result does not preclude SARS-CoV-2 infection and should not be used as the sole basis for treatment or other patient management decisions.  A negative result may occur with improper specimen collection / handling, submission of specimen other than nasopharyngeal swab, presence of viral mutation(s) within the areas targeted by this assay, and inadequate number of viral copies (<250 copies / mL). A negative result must be combined with clinical observations, patient history, and epidemiological information. Fact Sheet for Patients:   BoilerBrush.com.cy Fact Sheet for Healthcare Providers: https://pope.com/ This test is not yet approved or cleared  by the [place] FDA and has been authorized for detection and/or diagnosis of SARS-CoV-2 by FDA under an Emergency Use Authorization (EUA).  This EUA will remain in effect (meaning this test can be used) for the duration of the COVID-19 declaration under Section 564(b)(1) of the Act, 21 U.S.C. section 360bbb-3(b)(1), unless the authorization is terminated or revoked sooner. Performed at [facility], [address] [place], [place] [address]   Culture, blood (routine x 2)     Status: None   Collection Time: 07/15/19  2:32 AM   Specimen: BLOOD LEFT HAND  Result Value Ref Range Status   Specimen Description BLOOD LEFT HAND  Final   Special Requests   Final    BOTTLES DRAWN AEROBIC AND ANAEROBIC Blood Culture adequate volume   Culture   Final    NO GROWTH 5 DAYS Performed at [facility], [address] [place], [place]  [address]    Report Status 07/20/2019 FINAL  Final  Culture, blood (routine x 2)     Status: None   Collection Time: 07/15/19  2:32 AM   Specimen: Left Antecubital; Blood  Result Value Ref Range Status   Specimen Description LEFT ANTECUBITAL  Final   Special Requests   Final    BOTTLES DRAWN AEROBIC AND ANAEROBIC Blood Culture adequate volume   Culture   Final    NO GROWTH 5 DAYS Performed at [facility], [address], [place], [place] [address]    Report Status 07/20/2019 FINAL  Final  SARS Coronavirus 2 by RT PCR (hospital order, performed in [facility] hospital lab) Nasopharyngeal Nasopharyngeal Swab     Status: None   Collection Time: 07/17/19 11:08 AM   Specimen: Nasopharyngeal Swab  Result Value Ref Range Status   SARS Coronavirus 2 NEGATIVE NEGATIVE Final    Comment: (NOTE) SARS-CoV-2 target nucleic acids are NOT DETECTED. The SARS-CoV-2 RNA is generally detectable in upper and lower respiratory specimens during the acute phase of infection. The lowest concentration of SARS-CoV-2 viral  copies this assay can detect is 250 copies / mL. A negative result does not preclude SARS-CoV-2 infection and should not be used as the sole basis for treatment or other patient management decisions.  A negative result may occur with improper specimen collection / handling, submission of specimen other than nasopharyngeal swab, presence of viral mutation(s) within the areas targeted by this assay, and inadequate number of viral copies (<250 copies / mL). A negative result must be combined with clinical observations, patient history, and epidemiological information. Fact Sheet for Patients:   BoilerBrush.com.cy Fact Sheet for Healthcare Providers: https://pope.com/ This test is not yet approved or cleared  by the [place] FDA and has been authorized for detection and/or diagnosis of SARS-CoV-2 by FDA under an Emergency Use  Authorization (EUA).  This EUA will remain in effect (meaning this test can be used) for the duration of the COVID-19 declaration under Section 564(b)(1) of the Act, 21 U.S.C. section 360bbb-3(b)(1), unless the authorization is terminated or revoked sooner. Performed at [facility], [address], [place], [place] [address]      Discharge Instructions:   Discharge Instructions    Diet - low sodium heart healthy   Complete by: As directed    Diet Carb Modified   Complete by: As directed    Discharge instructions   Complete by: As directed    Recommend swallow evaluation by speech therapist as an outpatient Follow up with physician at nursing home within 3 days of discharge Outpatient follow up with gastroenterologist for evaluation of dysphagia Monitor BMP closely   Increase activity slowly   Complete by: As directed      Allergies as of 07/20/2019      Reactions   Alprazolam Swelling   Fentanyl Other (See Comments)   "burning and hot"   Meperidine Itching   Other reaction(s): Other (See Comments)   Ranitidine Hcl    Other reaction(s): Other (See Comments)      Medication List    STOP taking these medications   insulin aspart protamine- aspart (70-30) 100 UNIT/ML injection Commonly known as: NOVOLOG MIX 70/30   meloxicam 15 MG tablet Commonly known as: MOBIC   nicotine 21 mg/24hr patch Commonly known as: NICODERM CQ - dosed in mg/24 hours   valsartan 160 MG tablet Commonly known as: DIOVAN     TAKE these medications   amLODipine 10 MG tablet Commonly known as: NORVASC TAKE 1 TABLET BY MOUTH EVERY DAY   aspirin EC 81 MG tablet Take 81 mg by mouth daily.   benztropine 0.5 MG tablet Commonly known as: COGENTIN Take 0.5 mg by mouth 2 (two) times daily.   calcitRIOL 0.25 MCG capsule Commonly known as: ROCALTROL Take 0.25 mcg by mouth daily.   cholecalciferol 25 MCG (1000 UNIT) tablet Commonly known as: VITAMIN D3 Take 1,000 Units by mouth  daily.   citalopram 20 MG tablet Commonly known as: CELEXA Take 20 mg by mouth daily.   docusate sodium 100 MG capsule Commonly known as: COLACE Take 100 mg by mouth 2 (two) times daily.   famotidine 40 MG tablet Commonly known as: PEPCID Take 40 mg by mouth daily.   fenofibrate 48 MG tablet Commonly known as: TRICOR Take 48 mg by mouth daily.   fluticasone 50 MCG/ACT nasal spray Commonly known as: FLONASE Place 2 sprays into both nostrils daily.   furosemide 20 MG tablet Commonly known as: LASIX Take 20 mg by mouth daily.   gabapentin 100 MG capsule Commonly  known as: NEURONTIN Take 100-300 mg by mouth See admin instructions. Take 1 capsule (100mg ) by mouth every morning and daily at noon and take 3 capsules (300mg ) by mouth every night   Lantus SoloStar 100 UNIT/ML Solostar Pen Generic drug: insulin glargine Inject 24 Units into the skin at bedtime.   levothyroxine 125 MCG tablet Commonly known as: SYNTHROID Take 1 tablet (125 mcg total) by mouth daily before breakfast.   losartan 50 MG tablet Commonly known as: COZAAR Take 50 mg by mouth daily.   magnesium oxide 400 MG tablet Commonly known as: MAG-OX Take 400 mg by mouth daily.   metFORMIN 1000 MG tablet Commonly known as: GLUCOPHAGE Take 0.5 tablets (500 mg total) by mouth 2 (two) times daily with a meal.   metoprolol tartrate 25 MG tablet Commonly known as: LOPRESSOR Take 25 mg by mouth 2 (two) times daily.   montelukast 10 MG tablet Commonly known as: SINGULAIR Take 10 mg by mouth at bedtime.   nitroGLYCERIN 0.2 mg/hr patch Commonly known as: NITRODUR - Dosed in mg/24 hr Place 0.2 mg onto the skin daily. leave patch on 12-14 hours, then remove for 10-12 hours prior to applying the next patch   primidone 50 MG tablet Commonly known as: MYSOLINE Take 50 mg by mouth 2 (two) times daily.   Rexulti 1 MG Tabs tablet Generic drug: brexpiprazole Take 1 mg by mouth at bedtime.   risperiDONE 0.5 MG  disintegrating tablet Commonly known as: RISPERDAL M-TABS Take 0.5 mg by mouth 2 (two) times daily.   rosuvastatin 40 MG tablet Commonly known as: CRESTOR Take 40 mg by mouth at bedtime.   sitaGLIPtin 50 MG tablet Commonly known as: JANUVIA Take 50 mg by mouth daily.   traMADol 50 MG tablet Commonly known as: ULTRAM Take 1 tablet (50 mg total) by mouth every 12 (twelve) hours as needed for moderate pain.   vitamin B-12 1000 MCG tablet Commonly known as: CYANOCOBALAMIN Take 1,000 mcg by mouth daily.       Contact information for follow-up providers    [facility] Follow up on 07/24/2019.   Specialty: Cardiology Why: at 9:30am. Enter through the Medical Mall entrance Contact information: [address] [place] [address] [phone]           Contact information for after-discharge care    Destination    [facility] Preferred SNF .   Service: Skilled Nursing Contact information: [address] [place] [address] [phone]                   Time coordinating discharge: 32 minutes  Signed:  Lurene Shadow  [facility] 07/20/2019, 11:43 AM

## 2019-07-20 NOTE — Progress Notes (Signed)
0800- assumed care of patient this AM, no c/o pain, discomfort or respiratory distress.     1337- Report called to [facility] Nurse Grover Canavan took report but was not going to be primary nurse.

## 2019-07-20 NOTE — Plan of Care (Signed)
  Problem: Education: Goal: Knowledge of General Education information will improve Description: Including pain rating scale, medication(s)/side effects and non-pharmacologic comfort measures 07/20/2019 1104 by Wandra Arthurs, RN Outcome: Progressing 07/20/2019 1100 by Wandra Arthurs, RN Outcome: Progressing   Problem: Health Behavior/Discharge Planning: Goal: Ability to manage health-related needs will improve 07/20/2019 1104 by Wandra Arthurs, RN Outcome: Progressing 07/20/2019 1100 by Wandra Arthurs, RN Outcome: Progressing   Problem: Clinical Measurements: Goal: Ability to maintain clinical measurements within normal limits will improve 07/20/2019 1104 by Wandra Arthurs, RN Outcome: Progressing 07/20/2019 1100 by Wandra Arthurs, RN Outcome: Progressing Goal: Will remain free from infection 07/20/2019 1104 by Wandra Arthurs, RN Outcome: Progressing 07/20/2019 1100 by Wandra Arthurs, RN Outcome: Progressing Goal: Diagnostic test results will improve 07/20/2019 1104 by Wandra Arthurs, RN Outcome: Progressing 07/20/2019 1100 by Wandra Arthurs, RN Outcome: Progressing Goal: Respiratory complications will improve 07/20/2019 1104 by Wandra Arthurs, RN Outcome: Progressing 07/20/2019 1100 by Wandra Arthurs, RN Outcome: Progressing Goal: Cardiovascular complication will be avoided 07/20/2019 1104 by Wandra Arthurs, RN Outcome: Progressing 07/20/2019 1100 by Wandra Arthurs, RN Outcome: Progressing   Problem: Activity: Goal: Risk for activity intolerance will decrease 07/20/2019 1104 by Wandra Arthurs, RN Outcome: Progressing 07/20/2019 1100 by Wandra Arthurs, RN Outcome: Progressing   Problem: Nutrition: Goal: Adequate nutrition will be maintained 07/20/2019 1104 by Wandra Arthurs, RN Outcome: Progressing 07/20/2019 1100 by Wandra Arthurs, RN Outcome: Progressing   Problem: Coping: Goal: Level  of anxiety will decrease 07/20/2019 1104 by Wandra Arthurs, RN Outcome: Progressing 07/20/2019 1100 by Wandra Arthurs, RN Outcome: Progressing   Problem: Elimination: Goal: Will not experience complications related to bowel motility 07/20/2019 1104 by Wandra Arthurs, RN Outcome: Progressing 07/20/2019 1100 by Wandra Arthurs, RN Outcome: Progressing Goal: Will not experience complications related to urinary retention 07/20/2019 1104 by Wandra Arthurs, RN Outcome: Progressing 07/20/2019 1100 by Wandra Arthurs, RN Outcome: Progressing   Problem: Pain Managment: Goal: General experience of comfort will improve 07/20/2019 1104 by Wandra Arthurs, RN Outcome: Progressing 07/20/2019 1100 by Wandra Arthurs, RN Outcome: Progressing   Problem: Safety: Goal: Ability to remain free from injury will improve 07/20/2019 1104 by Wandra Arthurs, RN Outcome: Progressing 07/20/2019 1100 by Wandra Arthurs, RN Outcome: Progressing   Problem: Skin Integrity: Goal: Risk for impaired skin integrity will decrease 07/20/2019 1104 by Wandra Arthurs, RN Outcome: Progressing 07/20/2019 1100 by Wandra Arthurs, RN Outcome: Progressing

## 2019-07-20 NOTE — Plan of Care (Signed)

## 2019-07-23 NOTE — Progress Notes (Signed)
Patient ID: Melanie King, female    DOB: April 12, 1945, 74 y.o.   MRN: [id number]  HPI  Melanie King is a 74 y/o female with a history of CAD, DM, hyperlipidemia, HTN, GERD, depression, pacemaker, COPD, current tobacco use and chronic heart failure.   Echo report from 07/15/19 reviewed and showed an EF of 60-65% along with moderate LVH, mild LAE and trace MR.   Admitted 07/15/19 due to HF/ COPD exacerbation. Cardiology and GI consults obtained. Initially given IV lasix and then transitioned to oral diuretics. Placed on bipap and then weaned off of that. Given antibiotics, steroids and nebulizers. Discharged after 5 days to SNF. Was in the ED 06/12/19 due to viral illness where she was treated and released.    She presents today for her initial visit with a chief complaint of minimal shortness of breath upon moderate exertion. She describes this as chronic in nature having been present for several years. She has associated fatigue, chest tightness, back pain, dizziness and anxiety along with this. She denies any abdominal distention, palpitations, pedal edema, chest pain or cough.   Says that she's currently not getting weighed at the facility. Is receiving PT services.   Past Medical History:  Diagnosis Date  . Arthritis   . CHF (congestive heart failure) (HCC)   . COPD (chronic obstructive pulmonary disease) (HCC)   . Coronary arteriosclerosis   . DDD (degenerative disc disease), cervical   . DDD (degenerative disc disease), lumbar   . Depression   . Diabetes mellitus without complication (HCC)   . GERD (gastroesophageal reflux disease)   . Hyperlipidemia   . Hypertension   . Osteoarthritis   . Pacemaker   . Tremor    Past Surgical History:  Procedure Laterality Date  . ABDOMINAL HYSTERECTOMY    . BREAST BIOPSY Right 1994   neg cyst removed  . CHOLECYSTECTOMY    . EYE SURGERY  1964, 1966, and 1967   bilateral  . FOOT SURGERY Right    cellulitis  . PACEMAKER INSERTION    . SPINE  SURGERY     cyst removed; [facility]   Family History  Problem Relation Age of Onset  . Breast cancer Maternal Aunt 40  . Cancer Mother        colon  . Aneurysm Father    Social History   Tobacco Use  . Smoking status: Current Every Day Smoker    Packs/day: 1.00    Years: 50.00    Pack years: 50.00    Types: Cigarettes  . Smokeless tobacco: Never Used  Substance Use Topics  . Alcohol use: No   Allergies  Allergen Reactions  . Alprazolam Swelling  . Fentanyl Other (See Comments)    "burning and hot"  . Meperidine Itching    Other reaction(s): Other (See Comments)  . Ranitidine Hcl     Other reaction(s): Other (See Comments)   Prior to Admission medications   Medication Sig Start Date End Date Taking? Authorizing Provider  amLODipine (NORVASC) 10 MG tablet TAKE 1 TABLET BY MOUTH EVERY DAY 08/28/16  Yes Gabriel Cirri, NP  aspirin EC 81 MG tablet Take 81 mg by mouth daily.   Yes [provider]  benztropine (COGENTIN) 0.5 MG tablet Take 0.5 mg by mouth 2 (two) times daily.   Yes [provider]  brexpiprazole (REXULTI) 1 MG TABS tablet Take 1 mg by mouth at bedtime.   Yes [provider]  calcitRIOL (ROCALTROL) 0.25 MCG capsule Take  0.25 mcg by mouth daily.   Yes [provider]  cholecalciferol (VITAMIN D3) 25 MCG (1000 UNIT) tablet Take 1,000 Units by mouth daily.   Yes [provider]  citalopram (CELEXA) 20 MG tablet Take 20 mg by mouth daily.   Yes [provider]  docusate sodium (COLACE) 100 MG capsule Take 100 mg by mouth 2 (two) times daily.   Yes [provider]  famotidine (PEPCID) 40 MG tablet Take 40 mg by mouth daily.   Yes [provider]  fenofibrate (TRICOR) 48 MG tablet Take 48 mg by mouth daily.   Yes [provider]  fluticasone (FLONASE) 50 MCG/ACT nasal spray Place 2 sprays into both nostrils daily.   Yes [provider]  furosemide (LASIX) 20 MG tablet Take 20 mg  by mouth daily.   Yes [provider]  gabapentin (NEURONTIN) 100 MG capsule Take 100-300 mg by mouth See admin instructions. Take 1 capsule (100mg ) by mouth every morning and daily at noon and take 3 capsules (300mg ) by mouth every night   Yes [provider]  LANTUS SOLOSTAR 100 UNIT/ML Solostar Pen Inject 24 Units into the skin at bedtime.  03/19/19  Yes [provider]  levothyroxine (SYNTHROID) 125 MCG tablet Take 1 tablet (125 mcg total) by mouth daily before breakfast. 05/28/19  Yes Dhungel, Nishant, MD  losartan (COZAAR) 50 MG tablet Take 50 mg by mouth daily.   Yes [provider]  magnesium oxide (MAG-OX) 400 MG tablet Take 400 mg by mouth daily.   Yes [provider]  metFORMIN (GLUCOPHAGE) 1000 MG tablet Take 0.5 tablets (500 mg total) by mouth 2 (two) times daily with a meal. 05/28/19  Yes Dhungel, Nishant, MD  metoprolol tartrate (LOPRESSOR) 25 MG tablet Take 25 mg by mouth 2 (two) times daily.   Yes [provider]  montelukast (SINGULAIR) 10 MG tablet Take 10 mg by mouth at bedtime.   Yes [provider]  nitroGLYCERIN (NITRODUR - DOSED IN MG/24 HR) 0.2 mg/hr patch Place 0.2 mg onto the skin daily. leave patch on 12-14 hours, then remove for 10-12 hours prior to applying the next patch   Yes [provider]  primidone (MYSOLINE) 50 MG tablet Take 50 mg by mouth 2 (two) times daily.   Yes [provider]  risperiDONE (RISPERDAL M-TABS) 0.5 MG disintegrating tablet Take 0.5 mg by mouth daily.    Yes [provider]  rosuvastatin (CRESTOR) 40 MG tablet Take 40 mg by mouth at bedtime.   Yes [provider]  sitaGLIPtin (JANUVIA) 50 MG tablet Take 50 mg by mouth daily.   Yes [provider]  traMADol (ULTRAM) 50 MG tablet Take 1 tablet (50 mg total) by mouth every 12 (twelve) hours as needed for moderate pain. 07/20/19  Yes Jennye Boroughs, MD  vitamin B-12 (CYANOCOBALAMIN) 1000 MCG tablet  Take 1,000 mcg by mouth daily.   Yes [provider]     Review of Systems  Constitutional: Positive for appetite change (decreased) and fatigue.  HENT: Positive for hearing loss. Negative for congestion, postnasal drip and sore throat.   Eyes: Negative.   Respiratory: Positive for chest tightness and shortness of breath (minimal). Negative for cough.   Cardiovascular: Negative for chest pain, palpitations and leg swelling.  Gastrointestinal: Negative for abdominal distention and abdominal pain.  Endocrine: Negative.   Genitourinary: Negative.   Musculoskeletal: Positive for back pain. Negative for neck pain.  Skin: Negative.   Allergic/Immunologic: Negative.  Neurological: Positive for dizziness. Negative for light-headedness.  Hematological: Negative for adenopathy. Does not bruise/bleed easily.  Psychiatric/Behavioral: Negative for dysphoric mood and sleep disturbance (sleeping on 1 pillow). The patient is nervous/anxious.     Vitals:   07/24/19 0915  BP: (!) 137/55  Pulse: 70  Resp: 18  SpO2: 98%  Weight: 146 lb (66.2 kg)  Height: 5\' 2"  (1.575 m)   Wt Readings from Last 3 Encounters:  07/24/19 146 lb (66.2 kg)  07/20/19 146 lb 2.6 oz (66.3 kg)  06/12/19 152 lb 1.9 oz (69 kg)   Lab Results  Component Value Date   CREATININE 1.50 (H) 07/20/2019   CREATININE 1.33 (H) 07/19/2019   CREATININE 1.20 (H) 07/18/2019    Physical Exam Vitals and nursing note reviewed.  Constitutional:      Appearance: She is well-developed.  HENT:     Head: Normocephalic and atraumatic.     Right Ear: Decreased hearing noted.     Left Ear: Decreased hearing noted.  Cardiovascular:     Rate and Rhythm: Normal rate and regular rhythm.  Pulmonary:     Effort: Pulmonary effort is normal. No respiratory distress.     Breath sounds: No wheezing or rales.  Abdominal:     General: Abdomen is flat. There is no distension.     Palpations: Abdomen is soft.  Musculoskeletal:         General: No tenderness. Normal range of motion.     Cervical back: Normal range of motion and neck supple.     Right lower leg: No edema.     Left lower leg: No edema.  Skin:    General: Skin is warm and dry.  Neurological:     General: No focal deficit present.     Mental Status: She is alert and oriented to person, place, and time.  Psychiatric:        Mood and Affect: Mood normal.        Behavior: Behavior normal.        Thought Content: Thought content normal.    Assessment & Plan:  1: Chronic heart failure with preserved ejection fraction with structural changes- - NYHA class II - euvolemic today - order written for facility to start weighing patient daily and call for an overnight weight gain of >2 pounds or a weekly weight gain of >5 pounds - not adding salt and is currently on a heart healthy diet at the facility - saw cardiology 07/20/2019) 06/04/19 - consider changing losartan to entresto at future visits with watching renal function - consider changing her metoprolol tartrate to succinate and possibly add spirononlactone  - BNP 07/15/19 was 593.0   2: HTN- - BP looked good today - saw PCP ([facility]) 06/23/19 and now currently seeing PCP at the facility - [facility] 07/20/19 reviewed and showed sodium 134, potassium 4.4, creatinine 1.5 and GFR 34  3: DM- - saw endocrinology 07/22/19) 06/04/19 - A1c 05/27/19 was 7.1% - nonfasting glucose in clinic today was 258; she says that she ate eggs, grits and black coffee for breakfast  4: Tobacco use- - was smoking 1 ppd of cigarettes but can't smoke at the facility - continued cessation discussed for 3 minutes with patient and her friend   Facility medication list was reviewed.   Return in 6 weeks or sooner for any questions/problems before then.

## 2019-07-24 ENCOUNTER — Encounter: Payer: Self-pay | Admitting: Family

## 2019-07-24 ENCOUNTER — Ambulatory Visit: Payer: No Typology Code available for payment source | Attending: Family | Admitting: Family

## 2019-07-24 ENCOUNTER — Telehealth: Payer: Self-pay | Admitting: Family

## 2019-07-24 ENCOUNTER — Other Ambulatory Visit: Payer: Self-pay

## 2019-07-24 VITALS — BP 137/55 | HR 70 | Resp 18 | Ht 62.0 in | Wt 146.0 lb

## 2019-07-24 DIAGNOSIS — Z7982 Long term (current) use of aspirin: Secondary | ICD-10-CM | POA: Insufficient documentation

## 2019-07-24 DIAGNOSIS — M1990 Unspecified osteoarthritis, unspecified site: Secondary | ICD-10-CM | POA: Insufficient documentation

## 2019-07-24 DIAGNOSIS — E1122 Type 2 diabetes mellitus with diabetic chronic kidney disease: Secondary | ICD-10-CM

## 2019-07-24 DIAGNOSIS — Z950 Presence of cardiac pacemaker: Secondary | ICD-10-CM | POA: Diagnosis not present

## 2019-07-24 DIAGNOSIS — Z800 Family history of malignant neoplasm of digestive organs: Secondary | ICD-10-CM | POA: Diagnosis not present

## 2019-07-24 DIAGNOSIS — J449 Chronic obstructive pulmonary disease, unspecified: Secondary | ICD-10-CM | POA: Insufficient documentation

## 2019-07-24 DIAGNOSIS — F17210 Nicotine dependence, cigarettes, uncomplicated: Secondary | ICD-10-CM | POA: Insufficient documentation

## 2019-07-24 DIAGNOSIS — F329 Major depressive disorder, single episode, unspecified: Secondary | ICD-10-CM | POA: Diagnosis not present

## 2019-07-24 DIAGNOSIS — E785 Hyperlipidemia, unspecified: Secondary | ICD-10-CM | POA: Diagnosis not present

## 2019-07-24 DIAGNOSIS — I2510 Atherosclerotic heart disease of native coronary artery without angina pectoris: Secondary | ICD-10-CM | POA: Insufficient documentation

## 2019-07-24 DIAGNOSIS — Z79890 Hormone replacement therapy (postmenopausal): Secondary | ICD-10-CM | POA: Insufficient documentation

## 2019-07-24 DIAGNOSIS — Z794 Long term (current) use of insulin: Secondary | ICD-10-CM | POA: Insufficient documentation

## 2019-07-24 DIAGNOSIS — I10 Essential (primary) hypertension: Secondary | ICD-10-CM

## 2019-07-24 DIAGNOSIS — Z885 Allergy status to narcotic agent status: Secondary | ICD-10-CM | POA: Insufficient documentation

## 2019-07-24 DIAGNOSIS — I5032 Chronic diastolic (congestive) heart failure: Secondary | ICD-10-CM

## 2019-07-24 DIAGNOSIS — K219 Gastro-esophageal reflux disease without esophagitis: Secondary | ICD-10-CM | POA: Diagnosis not present

## 2019-07-24 DIAGNOSIS — Z79899 Other long term (current) drug therapy: Secondary | ICD-10-CM | POA: Insufficient documentation

## 2019-07-24 DIAGNOSIS — N1832 Chronic kidney disease, stage 3b: Secondary | ICD-10-CM | POA: Insufficient documentation

## 2019-07-24 DIAGNOSIS — Z803 Family history of malignant neoplasm of breast: Secondary | ICD-10-CM | POA: Insufficient documentation

## 2019-07-24 DIAGNOSIS — Z888 Allergy status to other drugs, medicaments and biological substances status: Secondary | ICD-10-CM | POA: Diagnosis not present

## 2019-07-24 DIAGNOSIS — Z720 Tobacco use: Secondary | ICD-10-CM

## 2019-07-24 DIAGNOSIS — I130 Hypertensive heart and chronic kidney disease with heart failure and stage 1 through stage 4 chronic kidney disease, or unspecified chronic kidney disease: Secondary | ICD-10-CM | POA: Diagnosis not present

## 2019-07-24 LAB — GLUCOSE, CAPILLARY: Glucose-Capillary: 258 mg/dL — ABNORMAL HIGH (ref 70–99)

## 2019-07-24 NOTE — Telephone Encounter (Signed)
Spoke with Nurse at [facility] who told me patient is doing well but hasnt been their long to really evaluate. She has been having some chest pressure but otherwise no complaints. She is on a low sodium diet and having PT and taking medications without complaints. She confirmed appointment with [place] for today 5/20.  Melanie King, [place]

## 2019-07-24 NOTE — Patient Instructions (Addendum)
Begin weighing daily and call for an overnight weight gain of > 2 pounds or a weekly weight gain of >5 pounds. 

## 2019-07-24 NOTE — Progress Notes (Signed)
[facility] - PHARMACIST COUNSELING NOTE  ADHERENCE ASSESSMENT  Adherence strategy: Patient is currently residing at [facility].    Do you ever forget to take your medication? [] Yes (1) [x] No (0)  Do you ever skip doses due to side effects? [] Yes (1) [x] No (0)  Do you have trouble affording your medicines? [] Yes (1) [x] No (0)  Are you ever unable to pick up your medication due to transportation difficulties? [] Yes (1) [x] No (0)  Do you ever stop taking your medications because you don't believe they are helping? [] Yes (1) [x] No (0)  Total score 0    Recommendations given to patient about increasing adherence: None needed. Will need to re-evaluate if / when patient returns to community living  Guideline-Directed Medical Therapy/Evidence Based Medicine  ACE/ARB/ARNI: Losartan 50 mg daily Beta Blocker: Metoprolol tartrate 25 mg BID Aldosterone Antagonist: None Diuretic: Furosemide 20 mg daily    SUBJECTIVE  HPI: Patient is a 74 y/o F with medical history as below who presents to CHF clinic for follow-up. Patient was most recently hospitalized 5/11 - 5/16 with CHF exacerbation and COPD exacerbation.   Past Medical History:  Diagnosis Date  . Arthritis   . CHF (congestive heart failure) ([place])   . COPD (chronic obstructive pulmonary disease) ([place])   . Coronary arteriosclerosis   . DDD (degenerative disc disease), cervical   . DDD (degenerative disc disease), lumbar   . Depression   . Diabetes mellitus without complication ([place])   . GERD (gastroesophageal reflux disease)   . Hyperlipidemia   . Hypertension   . Osteoarthritis   . Pacemaker   . Tremor        OBJECTIVE   Vital signs: HR 70, BP 137/55, weight (pounds) 146 ECHO: Date 07/15/19, EF 60-65%, notes: Moderate LVH, grade I diastolic dysfunction. LA mildly dilated, RA mildly dilated. No significant valvular disease.  BMP Latest Ref Rng & Units 07/20/2019 07/19/2019 07/18/2019   Glucose 70 - 99 mg/dL 291(H) 138(H) 187(H)  BUN 8 - 23 mg/dL 51(H) 47(H) 45(H)  Creatinine 0.44 - 1.00 mg/dL 1.50(H) 1.33(H) 1.20(H)  BUN/Creat Ratio 12 - 28 - - -  Sodium 135 - 145 mmol/L 134(L) 137 138  Potassium 3.5 - 5.1 mmol/L 4.4 3.8 4.0  Chloride 98 - 111 mmol/L 101 99 103  CO2 22 - 32 mmol/L 24 29 26   Calcium 8.9 - 10.3 mg/dL 9.6 10.5(H) 9.9    ASSESSMENT  Patient is well-appearing in no acute distress. She has been residing at [facility] since hospital discharge on 5/16. Facility administering medications. Patient denies adverse effects of therapy.  PLAN  1). CHF (EF 60-65%) -Fluid management with furosemide 20 mg daily -Continue heart healthy diet -Start daily weights -Discussed with provider - no changes to medication regimen today  2). Hypertension -Normotensive today in office -Antihypertensive regimen incdludes amlodipine 10 mg daily, losartan 50 mg daily, metoprolol tartrate 25 mg BID, and furosemide 20 mg daily -Vitals monitored at facility  3). Hyperlipidemia -Lipid panel on 03/17/19 with TG 220, HDL 27, and LDL 90 -Rosuvastatin 40 mg daily, fenofibrate 48 mg daily  4). COPD -Patient is not on any bronchodilators -Montelukast 10 mg HS  5). T2DM, un-controlled -POC glucose 258 today -HgbA1c 10.1% on 03/17/19 -Antidiabetic regimen includes Lantus 24 units HS, sitagliptin 50 mg daily, and metformin 500 mg BIDM -Medications are appropriately renally dose adjusted  6). Depression -Brexipiprazole 1 mg HS, citalopram 20 mg daily, risperidone 0.5 mg daily  7). Tremor -Primidone 50 mg BID, benztropine 0.5 mg BID  8). Diabetic neuropathy -Gabapentin 100 mg QAM + 100 QPM + 300 mg HS  9). GERD -Famotidine 40 mg daily -Consider dose reduction in view of renal function  10). Coronary atherosclerosis -Rosuvastatin 40 mg daily, ASA 81 mg daily  11). Nutritional supplements -Vitamin D3 1000 units daily, calcitriol 0.25 mcg daily, magnesium  400 mg daily, vitamin B12 1000 mcg daily  12). Angina -Nitroglycerin patch 0.2 mg daily over 12-14 hours  13). Pain -Tramadol 50 mg q12h PRN  14). Allergies -Flonase 2 spray NS daily + montelukast 10 mg HS   Time spent: 15 minutes  [place] Resident 07/24/2019 8:45 AM    Current Outpatient Medications:  .  amLODipine (NORVASC) 10 MG tablet, TAKE 1 TABLET BY MOUTH EVERY DAY, Disp: 30 tablet, Rfl: 0 .  aspirin EC 81 MG tablet, Take 81 mg by mouth daily., Disp: , Rfl:  .  benztropine (COGENTIN) 0.5 MG tablet, Take 0.5 mg by mouth 2 (two) times daily., Disp: , Rfl:  .  brexpiprazole (REXULTI) 1 MG TABS tablet, Take 1 mg by mouth at bedtime., Disp: , Rfl:  .  calcitRIOL (ROCALTROL) 0.25 MCG capsule, Take 0.25 mcg by mouth daily., Disp: , Rfl:  .  cholecalciferol (VITAMIN D3) 25 MCG (1000 UNIT) tablet, Take 1,000 Units by mouth daily., Disp: , Rfl:  .  citalopram (CELEXA) 20 MG tablet, Take 20 mg by mouth daily., Disp: , Rfl:  .  docusate sodium (COLACE) 100 MG capsule, Take 100 mg by mouth 2 (two) times daily., Disp: , Rfl:  .  famotidine (PEPCID) 40 MG tablet, Take 40 mg by mouth daily., Disp: , Rfl:  .  fenofibrate (TRICOR) 48 MG tablet, Take 48 mg by mouth daily., Disp: , Rfl:  .  fluticasone (FLONASE) 50 MCG/ACT nasal spray, Place 2 sprays into both nostrils daily., Disp: , Rfl:  .  furosemide (LASIX) 20 MG tablet, Take 20 mg by mouth daily., Disp: , Rfl:  .  gabapentin (NEURONTIN) 100 MG capsule, Take 100-300 mg by mouth See admin instructions. Take 1 capsule (100mg ) by mouth every morning and daily at noon and take 3 capsules (300mg ) by mouth every night, Disp: , Rfl:  .  LANTUS SOLOSTAR 100 UNIT/ML Solostar Pen, Inject 24 Units into the skin at bedtime. , Disp: , Rfl:  .  levothyroxine (SYNTHROID) 125 MCG tablet, Take 1 tablet (125 mcg total) by mouth daily before breakfast., Disp: 30 tablet, Rfl: 0 .  losartan (COZAAR) 50 MG tablet, Take 50 mg by mouth daily.,  Disp: , Rfl:  .  magnesium oxide (MAG-OX) 400 MG tablet, Take 400 mg by mouth daily., Disp: , Rfl:  .  metFORMIN (GLUCOPHAGE) 1000 MG tablet, Take 0.5 tablets (500 mg total) by mouth 2 (two) times daily with a meal., Disp: 30 tablet, Rfl: 0 .  metoprolol tartrate (LOPRESSOR) 25 MG tablet, Take 25 mg by mouth 2 (two) times daily., Disp: , Rfl:  .  montelukast (SINGULAIR) 10 MG tablet, Take 10 mg by mouth at bedtime., Disp: , Rfl:  .  nitroGLYCERIN (NITRODUR - DOSED IN MG/24 HR) 0.2 mg/hr patch, Place 0.2 mg onto the skin daily. leave patch on 12-14 hours, then remove for 10-12 hours prior to applying the next patch, Disp: , Rfl:  .  primidone (MYSOLINE) 50 MG tablet, Take 50 mg by mouth 2 (two) times daily., Disp: , Rfl:  .  risperiDONE (RISPERDAL M-TABS) 0.5  MG disintegrating tablet, Take 0.5 mg by mouth 2 (two) times daily., Disp: , Rfl:  .  rosuvastatin (CRESTOR) 40 MG tablet, Take 40 mg by mouth at bedtime., Disp: , Rfl:  .  sitaGLIPtin (JANUVIA) 50 MG tablet, Take 50 mg by mouth daily., Disp: , Rfl:  .  traMADol (ULTRAM) 50 MG tablet, Take 1 tablet (50 mg total) by mouth every 12 (twelve) hours as needed for moderate pain., Disp: 20 tablet, Rfl: 0 .  vitamin B-12 (CYANOCOBALAMIN) 1000 MCG tablet, Take 1,000 mcg by mouth daily., Disp: , Rfl:    COUNSELING POINTS/CLINICAL PEARLS  Metoprolol Succinate (Goal: 200 mg once daily) Warn patient to avoid activities requiring mental alertness or coordination until drug effects are realized, as drug may cause dizziness. Tell patient planning major surgery with anesthesia to alert physician that drug is being used, as drug impairs ability of heart to respond to reflex adrenergic stimuli. Drug may cause diarrhea, fatigue, headache, or depression. Advise diabetic patient to carefully monitor blood glucose as drug may mask symptoms of hypoglycemia. Patient should take extended-release tablet with or immediately following meals. Counsel patient against  sudden discontinuation of drug, as this may precipitate hypertension, angina, or myocardial infarction. In the event of a missed dose, counsel patient to skip the missed dose and maintain a regular dosing schedule. Losartan (Goal: 150 mg once daily)  Warn female patient to avoid pregnancy and to report a pregnancy that occurs during therapy.  Side effects may include dizziness, upper respiratory infection, nasal congestion, and back pain.  Warn patient to avoid use of potassium supplements or potassium-containing salt substitutes unless they consult healthcare provider. Furosemide  Drug causes sun-sensitivity. Advise patient to use sunscreen and avoid tanning beds. Patient should avoid activities requiring coordination until drug effects are realized, as drug may cause dizziness, vertigo, or blurred vision. This drug may cause hyperglycemia, hyperuricemia, constipation, diarrhea, loss of appetite, nausea, vomiting, purpuric disorder, cramps, spasticity, asthenia, headache, paresthesia, or scaling eczema. Instruct patient to report unusual bleeding/bruising or signs/symptoms of hypotension, infection, pancreatitis, or ototoxicity (tinnitus, hearing impairment). Advise patient to report signs/symptoms of a severe skin reactions (flu-like symptoms, spreading red rash, or skin/mucous membrane blistering) or erythema multiforme. Instruct patient to eat high-potassium foods during drug therapy, as directed by healthcare professional.  Patient should not drink alcohol while taking this drug.  DRUGS TO AVOID IN HEART FAILURE  Drug or Class Mechanism  Analgesics . NSAIDs . COX-2 inhibitors . Glucocorticoids  Sodium and water retention, increased systemic vascular resistance, decreased response to diuretics   Diabetes Medications . Metformin . Thiazolidinediones o Rosiglitazone (Avandia) o Pioglitazone (Actos) . DPP4 Inhibitors o Saxagliptin (Onglyza) o Sitagliptin (Januvia)   Lactic  acidosis Possible calcium channel blockade   Unknown  Antiarrhythmics . Class I  o Flecainide o Disopyramide . Class III o Sotalol . Other o Dronedarone  Negative inotrope, proarrhythmic   Proarrhythmic, beta blockade  Negative inotrope  Antihypertensives . Alpha Blockers o Doxazosin . Calcium Channel Blockers o Diltiazem o Verapamil o Nifedipine . Central Alpha Adrenergics o Moxonidine . Peripheral Vasodilators o Minoxidil  Increases renin and aldosterone  Negative inotrope    Possible sympathetic withdrawal  Unknown  Anti-infective . Itraconazole . Amphotericin B  Negative inotrope Unknown  Hematologic . Anagrelide . Cilostazol   Possible inhibition of PD IV Inhibition of PD III causing arrhythmias  Neurologic/Psychiatric . Stimulants . Anti-Seizure Drugs o Carbamazepine o Pregabalin . Antidepressants o Tricyclics o Citalopram . Parkinsons o Bromocriptine o Pergolide o  Pramipexole . Antipsychotics o Clozapine . Antimigraine o Ergotamine o Methysergide . Appetite suppressants . Bipolar o Lithium  Peripheral alpha and beta agonist activity  Negative inotrope and chronotrope Calcium channel blockade  Negative inotrope, proarrhythmic Dose-dependent QT prolongation  Excessive serotonin activity/valvular damage Excessive serotonin activity/valvular damage Unknown  IgE mediated hypersensitivy, calcium channel blockade  Excessive serotonin activity/valvular damage Excessive serotonin activity/valvular damage Valvular damage  Direct myofibrillar degeneration, adrenergic stimulation  Antimalarials . Chloroquine . Hydroxychloroquine Intracellular inhibition of lysosomal enzymes  Urologic Agents . Alpha Blockers o Doxazosin o Prazosin o Tamsulosin o Terazosin  Increased renin and aldosterone  Adapted from Page RL, et al. "Drugs That May Cause or Exacerbate Heart Failure: A Scientific Statement from the [place]." Circulation 2016; 161:W96-E45. DOI: 10.1161/CIR.0000000000000426   MEDICATION ADHERENCES TIPS AND STRATEGIES 1. Taking medication as prescribed improves patient outcomes in heart failure (reduces hospitalizations, improves symptoms, increases survival) 2. Side effects of medications can be managed by decreasing doses, switching agents, stopping drugs, or adding additional therapy. Please let someone in the [facility] know if you have having bothersome side effects so we can modify your regimen. Do not alter your medication regimen without talking to [place].  3. Medication reminders can help patients remember to take drugs on time. If you are missing or forgetting doses you can try linking behaviors, using pill boxes, or an electronic reminder like an alarm on your phone or an app. Some people can also get automated phone calls as medication reminders.

## 2019-08-14 ENCOUNTER — Emergency Department
Admission: EM | Admit: 2019-08-14 | Discharge: 2019-08-14 | Disposition: A | Discharge status: Home / Self Care | Payer: Medicare Other | Attending: Emergency Medicine | Admitting: Emergency Medicine

## 2019-08-14 ENCOUNTER — Other Ambulatory Visit: Payer: Self-pay

## 2019-08-14 ENCOUNTER — Encounter: Payer: Self-pay | Admitting: Emergency Medicine

## 2019-08-14 ENCOUNTER — Emergency Department: Payer: Medicare Other

## 2019-08-14 DIAGNOSIS — Z950 Presence of cardiac pacemaker: Secondary | ICD-10-CM | POA: Insufficient documentation

## 2019-08-14 DIAGNOSIS — E119 Type 2 diabetes mellitus without complications: Secondary | ICD-10-CM | POA: Diagnosis not present

## 2019-08-14 DIAGNOSIS — Z7982 Long term (current) use of aspirin: Secondary | ICD-10-CM | POA: Insufficient documentation

## 2019-08-14 DIAGNOSIS — Z794 Long term (current) use of insulin: Secondary | ICD-10-CM | POA: Insufficient documentation

## 2019-08-14 DIAGNOSIS — I5033 Acute on chronic diastolic (congestive) heart failure: Secondary | ICD-10-CM | POA: Diagnosis not present

## 2019-08-14 DIAGNOSIS — I2510 Atherosclerotic heart disease of native coronary artery without angina pectoris: Secondary | ICD-10-CM | POA: Diagnosis not present

## 2019-08-14 DIAGNOSIS — I110 Hypertensive heart disease with heart failure: Secondary | ICD-10-CM | POA: Insufficient documentation

## 2019-08-14 DIAGNOSIS — J9620 Acute and chronic respiratory failure, unspecified whether with hypoxia or hypercapnia: Secondary | ICD-10-CM | POA: Diagnosis not present

## 2019-08-14 DIAGNOSIS — I495 Sick sinus syndrome: Secondary | ICD-10-CM | POA: Insufficient documentation

## 2019-08-14 DIAGNOSIS — R202 Paresthesia of skin: Secondary | ICD-10-CM | POA: Diagnosis present

## 2019-08-14 DIAGNOSIS — E039 Hypothyroidism, unspecified: Secondary | ICD-10-CM | POA: Diagnosis not present

## 2019-08-14 DIAGNOSIS — Z79891 Long term (current) use of opiate analgesic: Secondary | ICD-10-CM | POA: Diagnosis not present

## 2019-08-14 DIAGNOSIS — F17210 Nicotine dependence, cigarettes, uncomplicated: Secondary | ICD-10-CM | POA: Diagnosis not present

## 2019-08-14 DIAGNOSIS — J449 Chronic obstructive pulmonary disease, unspecified: Secondary | ICD-10-CM | POA: Diagnosis not present

## 2019-08-14 DIAGNOSIS — G5603 Carpal tunnel syndrome, bilateral upper limbs: Secondary | ICD-10-CM

## 2019-08-14 DIAGNOSIS — Z79899 Other long term (current) drug therapy: Secondary | ICD-10-CM | POA: Diagnosis not present

## 2019-08-14 NOTE — ED Notes (Signed)
Pt arrives from [facility] with c/o right hand weakness. Pt A*OX4.

## 2019-08-14 NOTE — Discharge Instructions (Addendum)
You will need to call the neurology department [facility]ol clinic to schedule your appointment for your nerve conduction study. You were referred there from the orthopedic department for testing. Wear wrist brace for protection however your carpal tunnel symptoms will not improve until you have had surgery. Continue with your regular medication.

## 2019-08-14 NOTE — ED Triage Notes (Addendum)
Pt presents to ED via POV, c/o R hand pain and decreased movement since 6/4. Pt states hx of arthritis to R hand. Pt states pain 7/10 at this time.   Pt states "my hand is paralyzed", pt able to move fingers however c/o increased pain with movement. Pt with strong palpable radial pulse to R hand, pt with +movement, sensation intact, pt with mild swelling noted to R hand at this time.

## 2019-08-14 NOTE — ED Notes (Signed)
See triage note  Presents with pain to right hand  States she woke up with pain and decreased movement to hand last Saturday morning  No swelling noted  Good pulses grip is weak

## 2019-08-14 NOTE — ED Provider Notes (Signed)
[facility] Emergency Department Provider Note  ____________________________________________   First MD Initiated Contact with Patient 08/14/19 1043     (approximate)  I have reviewed the triage vital signs and the nursing notes.   HISTORY  Chief Complaint Hand Pain   HPI Melanie King is a 74 y.o. female presents to the ED with complaint of numbness in her right hand.  Patient states that she woke up with decreased movement in her hand 5 days ago.  She denies any recent injury.  Patient has been seen at [facility] orthopedic department and told that she has carpal tunnel syndrome and both hands.  She rates her pain as a 7 out of 10.         Past Medical History:  Diagnosis Date  . Arthritis   . CHF (congestive heart failure) (HCC)   . COPD (chronic obstructive pulmonary disease) (HCC)   . Coronary arteriosclerosis   . DDD (degenerative disc disease), cervical   . DDD (degenerative disc disease), lumbar   . Depression   . Diabetes mellitus without complication (HCC)   . GERD (gastroesophageal reflux disease)   . Hyperlipidemia   . Hypertension   . Osteoarthritis   . Pacemaker   . Tremor     Patient Active Problem List   Diagnosis Date Noted  . Dysphagia 07/19/2019  . Acute respiratory failure due to COVID-19 (HCC) 07/16/2019  . Respiratory distress   . Acute on chronic congestive heart failure (HCC)   . Acute respiratory failure (HCC) 07/15/2019  . Frequent falls 05/26/2019  . Generalized weakness 05/26/2019  . AKI (acute kidney injury) (HCC) 03/21/2019  . Subdural hematoma (HCC) 03/20/2019  . Hypothyroidism 03/10/2019  . Depression   . Chronic diastolic CHF (congestive heart failure) (HCC)   . Acute metabolic encephalopathy   . Fall   . Tobacco abuse   . C6 cervical fracture (HCC)   . Acute delirium 05/12/2017  . Seizure (HCC) 05/10/2017  . Elevated sedimentation rate 04/04/2017  . Elevated C-reactive protein (CRP)  04/04/2017  . Chest pain, unspecified 04/02/2017  . Bilateral lower extremity pain (Secondary Area of Pain) (R>L) 04/02/2017  . Chronic hip pain, bilateral  (Secondary Area of Pain) (R>L) 04/02/2017  . Chronic bilateral low back pain with bilateral sciatica [facility] Area of Pain) (R>L) 04/02/2017  . Chronic pain syndrome 04/02/2017  . Disorder of bone, unspecified 04/02/2017  . Other long term (current) drug therapy 04/02/2017  . Other specified health status 04/02/2017  . Long term current use of opiate analgesic 04/02/2017  . Hip pain, bilateral 12/06/2016  . Tremor 05/27/2016  . Trigger finger of left hand 02/16/2016  . Osteoarthritis of both hands 02/16/2016  . Angina pectoris (HCC) 01/17/2016  . COPD (chronic obstructive pulmonary disease) (HCC) 01/17/2016  . CAD (coronary artery disease) 01/17/2016  . HLD (hyperlipidemia) 01/17/2016  . Hypertension 01/17/2016  . Sick sinus syndrome (HCC) 01/17/2016  . Chronic pain 01/17/2016  . Personal history of tobacco use, presenting hazards to health 11/16/2015  . Type 2 diabetes mellitus without complication, with long-term current use of insulin (HCC) 05/12/2013    Past Surgical History:  Procedure Laterality Date  . ABDOMINAL HYSTERECTOMY    . BREAST BIOPSY Right 1994   neg cyst removed  . CHOLECYSTECTOMY    . EYE SURGERY  1964, 1966, and 1967   bilateral  . FOOT SURGERY Right    cellulitis  . PACEMAKER INSERTION    . SPINE SURGERY  cyst removed; [facility]    Prior to Admission medications   Medication Sig Start Date End Date Taking? Authorizing Provider  amLODipine (NORVASC) 10 MG tablet TAKE 1 TABLET BY MOUTH EVERY DAY 08/28/16   Gabriel Cirri, NP  aspirin EC 81 MG tablet Take 81 mg by mouth daily.    [provider]  benztropine (COGENTIN) 0.5 MG tablet Take 0.5 mg by mouth 2 (two) times daily.    [provider]  brexpiprazole (REXULTI) 1 MG TABS tablet Take 1 mg by mouth at bedtime.    [provider]  calcitRIOL (ROCALTROL) 0.25 MCG capsule Take 0.25 mcg by mouth daily.    [provider]  cholecalciferol (VITAMIN D3) 25 MCG (1000 UNIT) tablet Take 1,000 Units by mouth daily.    [provider]  citalopram (CELEXA) 20 MG tablet Take 20 mg by mouth daily.    [provider]  docusate sodium (COLACE) 100 MG capsule Take 100 mg by mouth 2 (two) times daily.    [provider]  famotidine (PEPCID) 40 MG tablet Take 40 mg by mouth daily.    [provider]  fenofibrate (TRICOR) 48 MG tablet Take 48 mg by mouth daily.    [provider]  fluticasone (FLONASE) 50 MCG/ACT nasal spray Place 2 sprays into both nostrils daily.    [provider]  furosemide (LASIX) 20 MG tablet Take 20 mg by mouth daily.    [provider]  gabapentin (NEURONTIN) 100 MG capsule Take 100-300 mg by mouth See admin instructions. Take 1 capsule (100mg ) by mouth every morning and daily at noon and take 3 capsules (300mg ) by mouth every night    [provider]  LANTUS SOLOSTAR 100 UNIT/ML Solostar Pen Inject 24 Units into the skin at bedtime.  03/19/19   [provider]  levothyroxine (SYNTHROID) 125 MCG tablet Take 1 tablet (125 mcg total) by mouth daily before breakfast. 05/28/19   Dhungel, Nishant, MD  losartan (COZAAR) 50 MG tablet Take 50 mg by mouth daily.    [provider]  magnesium oxide (MAG-OX) 400 MG tablet Take 400 mg by mouth daily.    [provider]  metFORMIN (GLUCOPHAGE) 1000 MG tablet Take 0.5 tablets (500 mg total) by mouth 2 (two) times daily with a meal. 05/28/19   Dhungel, Nishant, MD  metoprolol tartrate (LOPRESSOR) 25 MG tablet Take 25 mg by mouth 2 (two) times daily.    [provider]  montelukast (SINGULAIR) 10 MG tablet Take 10 mg by mouth at bedtime.    [provider]  nitroGLYCERIN (NITRODUR - DOSED IN MG/24 HR) 0.2 mg/hr patch Place 0.2 mg onto the skin  daily. leave patch on 12-14 hours, then remove for 10-12 hours prior to applying the next patch    [provider]  primidone (MYSOLINE) 50 MG tablet Take 50 mg by mouth 2 (two) times daily.    [provider]  risperiDONE (RISPERDAL M-TABS) 0.5 MG disintegrating tablet Take 0.5 mg by mouth daily.     [provider]  rosuvastatin (CRESTOR) 40 MG tablet Take 40 mg by mouth at bedtime.    [provider]  sitaGLIPtin (JANUVIA) 50 MG tablet Take 50 mg by mouth daily.    [provider]  traMADol (ULTRAM) 50 MG tablet Take 1 tablet (50 mg total) by mouth every 12 (twelve) hours as needed for moderate pain. 07/20/19   03-04-1982, MD  vitamin B-12 (CYANOCOBALAMIN) 1000 MCG  tablet Take 1,000 mcg by mouth daily.    [provider]    Allergies Alprazolam, Fentanyl, Meperidine, and Ranitidine hcl  Family History  Problem Relation Age of Onset  . Breast cancer Maternal Aunt 62  . Cancer Mother        colon  . Aneurysm Father     Social History Social History   Tobacco Use  . Smoking status: Current Every Day Smoker    Packs/day: 1.00    Years: 50.00    Pack years: 50.00    Types: Cigarettes  . Smokeless tobacco: Never Used  Substance Use Topics  . Alcohol use: No  . Drug use: No    Review of Systems Constitutional: No fever/chills Eyes: No visual changes. ENT: No sore throat. Cardiovascular: Denies chest pain. Respiratory: Denies shortness of breath. Gastrointestinal: .  No nausea, no vomiting.  Genitourinary: Negative for dysuria. Musculoskeletal: Bilateral carpal tunnel syndrome. Skin: Negative for rash. Neurological: Negative for headaches or focal weakness.  Positive bilateral hand numbness, currently right greater than left.. ____________________________________________   PHYSICAL EXAM:  VITAL SIGNS: ED Triage Vitals  Enc Vitals Group     BP 08/14/19 1029 (!) 167/67     Pulse Rate 08/14/19 1029 69     Resp  08/14/19 1029 20     Temp 08/14/19 1029 98 F (36.7 C)     Temp Source 08/14/19 1029 Oral     SpO2 08/14/19 1029 100 %     Weight 08/14/19 1031 146 lb (66.2 kg)     Height 08/14/19 1031 5\' 1"  (1.549 m)     Head Circumference --      Peak Flow --      Pain Score 08/14/19 1030 7     Pain Loc --      Pain Edu? --      Excl. in [place]? --    Constitutional: Alert and oriented. Well appearing and in no acute distress. HOH.  Eyes: Conjunctivae are normal.  Head: Atraumatic. Nose: No congestion/rhinnorhea. Neck: No stridor.   Cardiovascular: Normal rate, regular rhythm. Grossly normal heart sounds.  Good peripheral circulation. Respiratory: Normal respiratory effort.  No retractions. Lungs CTAB. Musculoskeletal: On examination bilateral hands there is no gross deformity or obvious recent injury.  No soft tissue edema or discoloration is noted.  Patient is able to flex and extend digits bilateral hands.  Muscle strength in the right is decreased in comparison with the left.  Skin is intact, good radial pulse, cap refill is less than 3 seconds. Neurologic:  Normal speech and language. No gross focal neurologic deficits are appreciated.  Skin:  Skin is warm, dry and intact. No rash noted. Psychiatric: Mood and affect are normal. Speech and behavior are normal.  ____________________________________________   LABS (all labs ordered are listed, but only abnormal results are displayed)  Labs Reviewed - No data to display ____________________________________________  RADIOLOGY   Official radiology report(s): DG Hand Complete Right  Result Date: 08/14/2019 CLINICAL DATA:  Right hand pain for the past month.  No injury. EXAM: RIGHT HAND - COMPLETE 3+ VIEW COMPARISON:  Right hand x-rays dated December 27, 2016. FINDINGS: There is no evidence of fracture or dislocation. There is no evidence of arthropathy or other focal bone abnormality. Soft tissues are unremarkable. IMPRESSION: Negative.  Electronically Signed   By: Titus Dubin M.D.   On: 08/14/2019 11:28    ____________________________________________   PROCEDURES  Procedure(s) performed (including Critical Care):  Procedures Right wrist brace  was applied by nursing staff.  ____________________________________________   INITIAL IMPRESSION / ASSESSMENT AND PLAN / ED COURSE  As part of my medical decision making, I reviewed the following data within the electronic MEDICAL RECORD NUMBER Notes from prior ED visits and [place] Controlled Substance Database  74 year old female presents to the ED with history of right hand numbness and decreased use due to weakness.  Patient has been seen at the orthopedic department acrinol clinic and told that she had carpal tunnel in both hands.  Patient did not schedule her appointment for a nerve conduction study and it is documented that she was to have this done and then schedule surgery.  Patient did not go or call.  She states that she thought that someone would be calling her with an appointment.  I explained to her that with carpal tunnel syndrome there is no medication to make this better and that she will continue to have numbness in her hands.  The phone number for the neurology department to set up her nerve conduction study was listed on her discharge papers along with Dr. Rosita Kea to schedule surgery. ____________________________________________   FINAL CLINICAL IMPRESSION(S) / ED DIAGNOSES  Final diagnoses:  Carpal tunnel syndrome on both sides     ED Discharge Orders    None       Note:  This document was prepared using Dragon voice recognition software and may include unintentional dictation errors.    Tommi Rumps, PA-C 08/14/19 1259    Sharman Cheek, MD 08/15/19 2016

## 2019-08-29 ENCOUNTER — Other Ambulatory Visit: Payer: Self-pay

## 2019-08-29 ENCOUNTER — Emergency Department
Admission: EM | Admit: 2019-08-29 | Discharge: 2019-08-29 | Disposition: A | Discharge status: Home / Self Care | Payer: Medicare Other | Attending: Emergency Medicine | Admitting: Emergency Medicine

## 2019-08-29 ENCOUNTER — Emergency Department: Payer: Medicare Other

## 2019-08-29 DIAGNOSIS — Y9389 Activity, other specified: Secondary | ICD-10-CM | POA: Diagnosis not present

## 2019-08-29 DIAGNOSIS — E785 Hyperlipidemia, unspecified: Secondary | ICD-10-CM | POA: Insufficient documentation

## 2019-08-29 DIAGNOSIS — I110 Hypertensive heart disease with heart failure: Secondary | ICD-10-CM | POA: Insufficient documentation

## 2019-08-29 DIAGNOSIS — W010XXA Fall on same level from slipping, tripping and stumbling without subsequent striking against object, initial encounter: Secondary | ICD-10-CM | POA: Insufficient documentation

## 2019-08-29 DIAGNOSIS — Z794 Long term (current) use of insulin: Secondary | ICD-10-CM | POA: Diagnosis not present

## 2019-08-29 DIAGNOSIS — M545 Low back pain, unspecified: Secondary | ICD-10-CM

## 2019-08-29 DIAGNOSIS — Y92129 Unspecified place in nursing home as the place of occurrence of the external cause: Secondary | ICD-10-CM | POA: Insufficient documentation

## 2019-08-29 DIAGNOSIS — R52 Pain, unspecified: Secondary | ICD-10-CM

## 2019-08-29 DIAGNOSIS — F17210 Nicotine dependence, cigarettes, uncomplicated: Secondary | ICD-10-CM | POA: Diagnosis not present

## 2019-08-29 DIAGNOSIS — Y998 Other external cause status: Secondary | ICD-10-CM | POA: Diagnosis not present

## 2019-08-29 DIAGNOSIS — M25512 Pain in left shoulder: Secondary | ICD-10-CM | POA: Diagnosis not present

## 2019-08-29 DIAGNOSIS — E119 Type 2 diabetes mellitus without complications: Secondary | ICD-10-CM | POA: Diagnosis not present

## 2019-08-29 DIAGNOSIS — I5032 Chronic diastolic (congestive) heart failure: Secondary | ICD-10-CM | POA: Insufficient documentation

## 2019-08-29 MED ORDER — MELOXICAM 7.5 MG PO TABS: 7.5000 mg | ORAL_TABLET | Freq: Every day | ORAL | 0 refills | Status: DC

## 2019-08-29 MED ORDER — TRAMADOL HCL 50 MG PO TABS
50.0000 mg | ORAL_TABLET | Freq: Once | ORAL | Status: AC
  Administered 2019-08-29: 50 mg via ORAL
  Filled 2019-08-29: qty 1

## 2019-08-29 MED ORDER — MELOXICAM 7.5 MG PO TABS
7.5000 mg | ORAL_TABLET | Freq: Every day | ORAL | 0 refills | Status: DC
Start: 2019-08-29 — End: 2019-10-28

## 2019-08-29 MED ORDER — TRAMADOL HCL 50 MG PO TABS: 50.0000 mg | ORAL_TABLET | Freq: Two times a day (BID) | ORAL | 0 refills | Status: DC | PRN

## 2019-08-29 NOTE — ED Notes (Signed)
Report called to [facility]

## 2019-08-29 NOTE — ED Provider Notes (Signed)
[facility] Emergency Department Provider Note ____________________________________________  Time seen: Approximately 8:48 AM  I have reviewed the triage vital signs and the nursing notes.   HISTORY  Chief Complaint Fall    HPI Melanie King is a 74 y.o. female who presents to the emergency department for evaluation and treatment of left shoulder and low back pain. She states that she was getting out of bed and her feet slid out from under her and she landed on her bottom. She did not hit her head. She states she has chronic back pain and feels like the way she landed on her bottom has made it worse.   Past Medical History:  Diagnosis Date   Arthritis    CHF (congestive heart failure) (HCC)    COPD (chronic obstructive pulmonary disease) (HCC)    Coronary arteriosclerosis    DDD (degenerative disc disease), cervical    DDD (degenerative disc disease), lumbar    Depression    Diabetes mellitus without complication (HCC)    GERD (gastroesophageal reflux disease)    Hyperlipidemia    Hypertension    Osteoarthritis    Pacemaker    Tremor     Patient Active Problem List   Diagnosis Date Noted   Dysphagia 07/19/2019   Acute respiratory failure due to COVID-19 (HCC) 07/16/2019   Respiratory distress    Acute on chronic congestive heart failure (HCC)    Acute respiratory failure (HCC) 07/15/2019   Frequent falls 05/26/2019   Generalized weakness 05/26/2019   AKI (acute kidney injury) (HCC) 03/21/2019   Subdural hematoma (HCC) 03/20/2019   Hypothyroidism 03/10/2019   Depression    Chronic diastolic CHF (congestive heart failure) (HCC)    Acute metabolic encephalopathy    Fall    Tobacco abuse    C6 cervical fracture (HCC)    Acute delirium 05/12/2017   Seizure (HCC) 05/10/2017   Elevated sedimentation rate 04/04/2017   Elevated C-reactive protein (CRP) 04/04/2017   Chest pain, unspecified 04/02/2017    Bilateral lower extremity pain (Secondary Area of Pain) (R>L) 04/02/2017   Chronic hip pain, bilateral  (Secondary Area of Pain) (R>L) 04/02/2017   Chronic bilateral low back pain with bilateral sciatica [facility] Area of Pain) (R>L) 04/02/2017   Chronic pain syndrome 04/02/2017   Disorder of bone, unspecified 04/02/2017   Other long term (current) drug therapy 04/02/2017   Other specified health status 04/02/2017   Long term current use of opiate analgesic 04/02/2017   Hip pain, bilateral 12/06/2016   Tremor 05/27/2016   Trigger finger of left hand 02/16/2016   Osteoarthritis of both hands 02/16/2016   Angina pectoris (HCC) 01/17/2016   COPD (chronic obstructive pulmonary disease) (HCC) 01/17/2016   CAD (coronary artery disease) 01/17/2016   HLD (hyperlipidemia) 01/17/2016   Hypertension 01/17/2016   Sick sinus syndrome (HCC) 01/17/2016   Chronic pain 01/17/2016   Personal history of tobacco use, presenting hazards to health 11/16/2015   Type 2 diabetes mellitus without complication, with long-term current use of insulin (HCC) 05/12/2013    Past Surgical History:  Procedure Laterality Date   ABDOMINAL HYSTERECTOMY     BREAST BIOPSY Right 1994   neg cyst removed   CHOLECYSTECTOMY     EYE SURGERY  1964, 1966, and 1967   bilateral   FOOT SURGERY Right    cellulitis   PACEMAKER INSERTION     SPINE SURGERY     cyst removed; [facility]    Prior to Admission medications  Medication Sig Start Date End Date Taking? Authorizing Provider  amLODipine (NORVASC) 10 MG tablet TAKE 1 TABLET BY MOUTH EVERY DAY 08/28/16   Gabriel Cirri, NP  aspirin EC 81 MG tablet Take 81 mg by mouth daily.    [provider]  benztropine (COGENTIN) 0.5 MG tablet Take 0.5 mg by mouth 2 (two) times daily.    [provider]  brexpiprazole (REXULTI) 1 MG TABS tablet Take 1 mg by mouth at bedtime.    [provider]  calcitRIOL (ROCALTROL) 0.25 MCG  capsule Take 0.25 mcg by mouth daily.    [provider]  cholecalciferol (VITAMIN D3) 25 MCG (1000 UNIT) tablet Take 1,000 Units by mouth daily.    [provider]  citalopram (CELEXA) 20 MG tablet Take 20 mg by mouth daily.    [provider]  docusate sodium (COLACE) 100 MG capsule Take 100 mg by mouth 2 (two) times daily.    [provider]  famotidine (PEPCID) 40 MG tablet Take 40 mg by mouth daily.    [provider]  fenofibrate (TRICOR) 48 MG tablet Take 48 mg by mouth daily.    [provider]  fluticasone (FLONASE) 50 MCG/ACT nasal spray Place 2 sprays into both nostrils daily.    [provider]  furosemide (LASIX) 20 MG tablet Take 20 mg by mouth daily.    [provider]  gabapentin (NEURONTIN) 100 MG capsule Take 100-300 mg by mouth See admin instructions. Take 1 capsule (100mg ) by mouth every morning and daily at noon and take 3 capsules (300mg ) by mouth every night    [provider]  LANTUS SOLOSTAR 100 UNIT/ML Solostar Pen Inject 24 Units into the skin at bedtime.  03/19/19   [provider]  levothyroxine (SYNTHROID) 125 MCG tablet Take 1 tablet (125 mcg total) by mouth daily before breakfast. 05/28/19   Dhungel, Nishant, MD  losartan (COZAAR) 50 MG tablet Take 50 mg by mouth daily.    [provider]  magnesium oxide (MAG-OX) 400 MG tablet Take 400 mg by mouth daily.    [provider]  meloxicam (MOBIC) 7.5 MG tablet Take 1 tablet (7.5 mg total) by mouth daily. 08/29/19 08/28/20  Damario Gillie, 08/31/19 B, FNP  metFORMIN (GLUCOPHAGE) 1000 MG tablet Take 0.5 tablets (500 mg total) by mouth 2 (two) times daily with a meal. 05/28/19   Dhungel, Nishant, MD  metoprolol tartrate (LOPRESSOR) 25 MG tablet Take 25 mg by mouth 2 (two) times daily.    [provider]  montelukast (SINGULAIR) 10 MG tablet Take 10 mg by mouth at bedtime.    [provider]  nitroGLYCERIN (NITRODUR  - DOSED IN MG/24 HR) 0.2 mg/hr patch Place 0.2 mg onto the skin daily. leave patch on 12-14 hours, then remove for 10-12 hours prior to applying the next patch    [provider]  primidone (MYSOLINE) 50 MG tablet Take 50 mg by mouth 2 (two) times daily.    [provider]  risperiDONE (RISPERDAL M-TABS) 0.5 MG disintegrating tablet Take 0.5 mg by mouth daily.     [provider]  rosuvastatin (CRESTOR) 40 MG tablet Take 40 mg by mouth at bedtime.    [provider]  sitaGLIPtin (JANUVIA) 50 MG tablet Take 50 mg by mouth daily.    [provider]  traMADol (ULTRAM) 50 MG tablet Take 1 tablet (50 mg total) by mouth every 12 (twelve) hours as needed for moderate pain. 08/29/19  Murl Zogg B, FNP  vitamin B-12 (CYANOCOBALAMIN) 1000 MCG tablet Take 1,000 mcg by mouth daily.    [provider]    Allergies Alprazolam, Fentanyl, Meperidine, and Ranitidine hcl  Family History  Problem Relation Age of Onset   Breast cancer Maternal Aunt 87   Cancer Mother        colon   Aneurysm Father     Social History Social History   Tobacco Use   Smoking status: Current Every Day Smoker    Packs/day: 1.00    Years: 50.00    Pack years: 50.00    Types: Cigarettes   Smokeless tobacco: Never Used  Substance Use Topics   Alcohol use: No   Drug use: No    Review of Systems Constitutional: Negative for fever. Cardiovascular: Negative for chest pain. Respiratory: Negative for shortness of breath. Musculoskeletal: Positive for left shoulder and low back pain. Skin: Negative for open wounds.  Neurological: Negative for decrease in sensation  ____________________________________________   PHYSICAL EXAM:  VITAL SIGNS: ED Triage Vitals [08/29/19 0727]  Enc Vitals Group     BP (!) 169/59     Pulse Rate 71     Resp 18     Temp 98 F (36.7 C)     Temp Source Oral     SpO2 98 %     Weight 146 lb (66.2 kg)     Height 5\' 1"  (1.549  m)     Head Circumference      Peak Flow      Pain Score      Pain Loc      Pain Edu?      Excl. in [place]?     Constitutional: Alert and oriented. Well appearing and in no acute distress. Eyes: Conjunctivae are clear without discharge or drainage Head: Atraumatic Neck: Supple, non traumatic. Respiratory: No cough. Respirations are even and unlabored. Musculoskeletal: Left shoulder without obvious deformity. No step off. Pain increases with abduction. No focal midline tenderness of the lower back. Neurologic: Awake and alert.  Skin: No open wounds or lesions.  Psychiatric: Affect and behavior are appropriate.  ____________________________________________   LABS (all labs ordered are listed, but only abnormal results are displayed)  Labs Reviewed - No data to display ____________________________________________  RADIOLOGY  Images of the left shoulder and lower back are negative for acute findings.  I, Sherrie George, personally viewed and evaluated these images (plain radiographs) as part of my medical decision making, as well as reviewing the written report by the radiologist.  DG Lumbar Spine 2-3 Views  Result Date: 08/29/2019 CLINICAL DATA:  Left low back pain after sliding out of bed this morning EXAM: LUMBAR SPINE - 2-3 VIEW COMPARISON:  04/02/2017 lumbar spine radiographs FINDINGS: This report assumes 5 non rib-bearing lumbar vertebrae. Lumbar vertebral body heights are preserved, with no fracture. Mild multilevel lumbar degenerative disc disease, most prominent at T12-L1. Minimal 2 mm anterolisthesis at L4-5, stable. No new spondylolisthesis. Mild bilateral lower lumbar facet arthropathy. No aggressive appearing focal osseous lesions. Abdominal aortic atherosclerosis. Cholecystectomy clips are seen in the right upper quadrant of the abdomen. IMPRESSION: 1. No lumbar spine fracture. 2. Mild multilevel lumbar degenerative disc disease, most prominent at T12-L1. 3. Stable minimal 2  mm anterolisthesis at L4-5. Electronically Signed   By: Ilona Sorrel M.D.   On: 08/29/2019 09:31   DG Shoulder Left  Result Date: 08/29/2019 CLINICAL DATA:  Left shoulder pain after sliding out of bed this morning EXAM:  LEFT SHOULDER - 2+ VIEW COMPARISON:  None. FINDINGS: Partially visualized left subclavian 2 lead pacemaker. No fracture. No glenohumeral dislocation. No evidence of acromioclavicular separation. No suspicious focal osseous lesions. No significant arthropathy. No pathologic soft tissue calcifications. IMPRESSION: No left shoulder fracture or malalignment. Electronically Signed   By: Delbert Phenix M.D.   On: 08/29/2019 09:32   ____________________________________________   PROCEDURES  Procedures  ____________________________________________   INITIAL IMPRESSION / ASSESSMENT AND PLAN / ED COURSE  Melanie F Butchko is a 74 y.o. who presents to the emergency department for treatment and evaluation of left shoulder and low back pain. See HPI for further details.  X-rays of the lumbar spine and left shoulder are reassuring. Patient is ambulatory without assistance. She will be discharged home with meloxicam and tramadol. She is to follow up with primary care for symptoms not improving over the next week or so. She is to return to the ER for symptoms that change or worsen if unable to schedule an appointment.  Medications  traMADol (ULTRAM) tablet 50 mg (50 mg Oral Given 08/29/19 0086)    Pertinent labs & imaging results that were available during my care of the patient were reviewed by me and considered in my medical decision making (see chart for details).   _________________________________________   FINAL CLINICAL IMPRESSION(S) / ED DIAGNOSES  Final diagnoses:  Acute bilateral low back pain without sciatica  Acute pain of left shoulder    ED Discharge Orders         Ordered    traMADol (ULTRAM) 50 MG tablet  Every 12 hours PRN,   Status:  Discontinued     Reprint      08/29/19 1019    meloxicam (MOBIC) 7.5 MG tablet  Daily,   Status:  Discontinued     Reprint     08/29/19 1019    meloxicam (MOBIC) 7.5 MG tablet  Daily     Discontinue  Reprint     08/29/19 1047    traMADol (ULTRAM) 50 MG tablet  Every 12 hours PRN     Discontinue  Reprint     08/29/19 1047           If controlled substance prescribed during this visit, 12 month history viewed on the [facility] prior to issuing an initial prescription for Schedule II or III opiod.   Chinita Pester, FNP 08/29/19 1049    Minna Antis, MD 08/29/19 [phone]

## 2019-08-29 NOTE — ED Triage Notes (Signed)
Pt in via EMS from an assisted living facility with c/o fall. EMS reports this am pt slid out of bed, was helped up by staff. Pt was able to ambulate to go smoke. Per facility they have to call to have the pts checked out. Pt does reports back pain.

## 2019-08-29 NOTE — ED Notes (Addendum)
See triage note  States she slid out of bed  Hitting head  No LOC  Having low back pain  Was ambulatory after fall  Pt came from [facility] [address]

## 2019-08-29 NOTE — Discharge Instructions (Addendum)
Please follow-up with your primary care provider if her symptoms are not improving over the week.  Return to the emergency department for symptoms change or worsen if you are unable to schedule an appointment.

## 2019-08-29 NOTE — ED Triage Notes (Signed)
See first nurse note. Pt in NAD, A&Ox4. PT reports she tried to get out of bed this morning and slid out of the bed. Denies hitting her head but states her low back hurts, reports this is chronic pain but is hurting worse today. Pt ambulated to go smoke after fall per staff. No obvious deformities.

## 2019-09-03 NOTE — Progress Notes (Deleted)
Patient ID: Melanie King, female    DOB: 1946-03-06, 74 y.o.   MRN: [id number]  HPI  Melanie King is a 74 y/o female with a history of CAD, DM, hyperlipidemia, HTN, GERD, depression, pacemaker, COPD, current tobacco use and chronic heart failure.   Echo report from 07/15/19 reviewed and showed an EF of 60-65% along with moderate LVH, mild LAE and trace MR.   In the ED 08/29/19 due to mechanical fall. Lumbar spine and shoulder xray were negative. Treated and released. Was in the ED 08/14/19 due to carpal tunnel syndrome where she was treated and released. Admitted 07/26/19 due to fall with subsequent head trauma. Brain CT/ head CT, neck CT and spine CT were negative. Medications adjusted. Discharged after 5 days. Admitted 07/15/19 due to HF/ COPD exacerbation. Cardiology and GI consults obtained. Initially given IV lasix and then transitioned to oral diuretics. Placed on bipap and then weaned off of that. Given antibiotics, steroids and nebulizers. Discharged after 5 days to SNF. Was in the ED 06/12/19 due to viral illness where she was treated and released.    She presents today for a follow-up visit with a chief complaint of .   Past Medical History:  Diagnosis Date  . Arthritis   . CHF (congestive heart failure) (HCC)   . COPD (chronic obstructive pulmonary disease) (HCC)   . Coronary arteriosclerosis   . DDD (degenerative disc disease), cervical   . DDD (degenerative disc disease), lumbar   . Depression   . Diabetes mellitus without complication (HCC)   . GERD (gastroesophageal reflux disease)   . Hyperlipidemia   . Hypertension   . Osteoarthritis   . Pacemaker   . Tremor    Past Surgical History:  Procedure Laterality Date  . ABDOMINAL HYSTERECTOMY    . BREAST BIOPSY Right 1994   neg cyst removed  . CHOLECYSTECTOMY    . EYE SURGERY  1964, 1966, and 1967   bilateral  . FOOT SURGERY Right    cellulitis  . PACEMAKER INSERTION    . SPINE SURGERY     cyst removed; [facility]    Family History  Problem Relation Age of Onset  . Breast cancer Maternal Aunt 40  . Cancer Mother        colon  . Aneurysm Father    Social History   Tobacco Use  . Smoking status: Current Every Day Smoker    Packs/day: 1.00    Years: 50.00    Pack years: 50.00    Types: Cigarettes  . Smokeless tobacco: Never Used  Substance Use Topics  . Alcohol use: No   Allergies  Allergen Reactions  . Alprazolam Swelling  . Fentanyl Other (See Comments)    "burning and hot"  . Meperidine Itching    Other reaction(s): Other (See Comments)  . Ranitidine Hcl     Other reaction(s): Other (See Comments)      Review of Systems  Constitutional: Positive for appetite change (decreased) and fatigue.  HENT: Positive for hearing loss. Negative for congestion, postnasal drip and sore throat.   Eyes: Negative.   Respiratory: Positive for chest tightness and shortness of breath (minimal). Negative for cough.   Cardiovascular: Negative for chest pain, palpitations and leg swelling.  Gastrointestinal: Negative for abdominal distention and abdominal pain.  Endocrine: Negative.   Genitourinary: Negative.   Musculoskeletal: Positive for back pain. Negative for neck pain.  Skin: Negative.   Allergic/Immunologic: Negative.   Neurological: Positive for dizziness. Negative for  light-headedness.  Hematological: Negative for adenopathy. Does not bruise/bleed easily.  Psychiatric/Behavioral: Negative for dysphoric mood and sleep disturbance (sleeping on 1 pillow). The patient is nervous/anxious.       Physical Exam Vitals and nursing note reviewed.  Constitutional:      Appearance: She is well-developed.  HENT:     Head: Normocephalic and atraumatic.     Right Ear: Decreased hearing noted.     Left Ear: Decreased hearing noted.  Cardiovascular:     Rate and Rhythm: Normal rate and regular rhythm.  Pulmonary:     Effort: Pulmonary effort is normal. No respiratory distress.     Breath  sounds: No wheezing or rales.  Abdominal:     General: Abdomen is flat. There is no distension.     Palpations: Abdomen is soft.  Musculoskeletal:        General: No tenderness. Normal range of motion.     Cervical back: Normal range of motion and neck supple.     Right lower leg: No edema.     Left lower leg: No edema.  Skin:    General: Skin is warm and dry.  Neurological:     General: No focal deficit present.     Mental Status: She is alert and oriented to person, place, and time.  Psychiatric:        Mood and Affect: Mood normal.        Behavior: Behavior normal.        Thought Content: Thought content normal.    Assessment & Plan:  1: Chronic heart failure with preserved ejection fraction with structural changes- - NYHA class II - euvolemic today - order written for facility to start weighing patient daily and call for an overnight weight gain of >2 pounds or a weekly weight gain of >5 pounds - weight 146 pounds from last visit here 6 weeks ago - not adding salt and is currently on a heart healthy diet at the facility - saw cardiology Melanie King) 06/04/19 - consider changing losartan to entresto at future visits with watching renal function - consider changing her metoprolol tartrate to succinate and possibly add spirononlactone  - BNP 07/15/19 was 593.0   2: HTN- - BP  - saw PCP (Melanie King) 06/23/19 and now currently seeing PCP at the facility - [facility] 07/30/19 reviewed and showed sodium 136, potassium 3.8, creatinine 1.2 and GFR 45  3: DM- - saw endocrinology Melanie King) 06/04/19 - A1c 07/26/19 was 7.5% - nonfasting glucose in clinic today was   4: Tobacco use- - was smoking 1 ppd of cigarettes but can't smoke at the facility - continued cessation discussed for 3 minutes with patient and her friend   Facility medication list was reviewed.

## 2019-09-09 ENCOUNTER — Ambulatory Visit: Payer: Medicare Other | Admitting: Family

## 2019-09-10 ENCOUNTER — Other Ambulatory Visit: Payer: Self-pay | Admitting: Acute Care

## 2019-09-10 DIAGNOSIS — R29898 Other symptoms and signs involving the musculoskeletal system: Secondary | ICD-10-CM

## 2019-09-17 ENCOUNTER — Emergency Department
Admission: EM | Admit: 2019-09-17 | Discharge: 2019-09-17 | Disposition: A | Discharge status: Home / Self Care | Payer: Medicare Other | Attending: Student in an Organized Health Care Education/Training Program | Admitting: Student in an Organized Health Care Education/Training Program

## 2019-09-17 ENCOUNTER — Other Ambulatory Visit: Payer: Self-pay

## 2019-09-17 ENCOUNTER — Emergency Department: Payer: Medicare Other

## 2019-09-17 DIAGNOSIS — E119 Type 2 diabetes mellitus without complications: Secondary | ICD-10-CM | POA: Diagnosis not present

## 2019-09-17 DIAGNOSIS — Z794 Long term (current) use of insulin: Secondary | ICD-10-CM | POA: Diagnosis not present

## 2019-09-17 DIAGNOSIS — Z950 Presence of cardiac pacemaker: Secondary | ICD-10-CM | POA: Diagnosis not present

## 2019-09-17 DIAGNOSIS — R0789 Other chest pain: Secondary | ICD-10-CM | POA: Insufficient documentation

## 2019-09-17 DIAGNOSIS — I110 Hypertensive heart disease with heart failure: Secondary | ICD-10-CM | POA: Diagnosis not present

## 2019-09-17 DIAGNOSIS — F17210 Nicotine dependence, cigarettes, uncomplicated: Secondary | ICD-10-CM | POA: Insufficient documentation

## 2019-09-17 DIAGNOSIS — I2510 Atherosclerotic heart disease of native coronary artery without angina pectoris: Secondary | ICD-10-CM | POA: Insufficient documentation

## 2019-09-17 DIAGNOSIS — E039 Hypothyroidism, unspecified: Secondary | ICD-10-CM | POA: Insufficient documentation

## 2019-09-17 DIAGNOSIS — Z7982 Long term (current) use of aspirin: Secondary | ICD-10-CM | POA: Diagnosis not present

## 2019-09-17 DIAGNOSIS — Z79899 Other long term (current) drug therapy: Secondary | ICD-10-CM | POA: Insufficient documentation

## 2019-09-17 DIAGNOSIS — J449 Chronic obstructive pulmonary disease, unspecified: Secondary | ICD-10-CM | POA: Diagnosis not present

## 2019-09-17 DIAGNOSIS — I5032 Chronic diastolic (congestive) heart failure: Secondary | ICD-10-CM | POA: Diagnosis not present

## 2019-09-17 LAB — CBC
HCT: 29 % — ABNORMAL LOW (ref 36.0–46.0)
Hemoglobin: 9.4 g/dL — ABNORMAL LOW (ref 12.0–15.0)
MCH: 29.6 pg (ref 26.0–34.0)
MCHC: 32.4 g/dL (ref 30.0–36.0)
MCV: 91.2 fL (ref 80.0–100.0)
Platelets: 369 10*3/uL (ref 150–400)
RBC: 3.18 MIL/uL — ABNORMAL LOW (ref 3.87–5.11)
RDW: 16.8 % — ABNORMAL HIGH (ref 11.5–15.5)
WBC: 8.4 10*3/uL (ref 4.0–10.5)
nRBC: 0 % (ref 0.0–0.2)

## 2019-09-17 LAB — URINALYSIS, ROUTINE W REFLEX MICROSCOPIC
Bacteria, UA: NONE SEEN
Bilirubin Urine: NEGATIVE
Glucose, UA: NEGATIVE mg/dL
Hgb urine dipstick: NEGATIVE
Ketones, ur: NEGATIVE mg/dL
Leukocytes,Ua: NEGATIVE
Nitrite: NEGATIVE
Protein, ur: 30 mg/dL — AB
Specific Gravity, Urine: 1.005 (ref 1.005–1.030)
pH: 8 (ref 5.0–8.0)

## 2019-09-17 LAB — BASIC METABOLIC PANEL
Anion gap: 11 (ref 5–15)
BUN: 18 mg/dL (ref 8–23)
CO2: 25 mmol/L (ref 22–32)
Calcium: 9.3 mg/dL (ref 8.9–10.3)
Chloride: 99 mmol/L (ref 98–111)
Creatinine, Ser: 1.2 mg/dL — ABNORMAL HIGH (ref 0.44–1.00)
GFR calc Af Amer: 52 mL/min — ABNORMAL LOW (ref >60)
GFR calc non Af Amer: 45 mL/min — ABNORMAL LOW (ref >60)
Glucose, Bld: 224 mg/dL — ABNORMAL HIGH (ref 70–99)
Potassium: 4.1 mmol/L (ref 3.5–5.1)
Sodium: 135 mmol/L (ref 135–145)

## 2019-09-17 LAB — HEPATIC FUNCTION PANEL
ALT: 13 U/L (ref 0–44)
AST: 15 U/L (ref 15–41)
Albumin: 3.7 g/dL (ref 3.5–5.0)
Alkaline Phosphatase: 65 U/L (ref 38–126)
Bilirubin, Direct: <0.1 mg/dL (ref 0.0–0.2)
Total Bilirubin: 0.7 mg/dL (ref 0.3–1.2)
Total Protein: 7.3 g/dL (ref 6.5–8.1)

## 2019-09-17 LAB — TROPONIN I (HIGH SENSITIVITY)
Troponin I (High Sensitivity): 12 ng/L (ref <18)
Troponin I (High Sensitivity): 12 ng/L (ref <18)

## 2019-09-17 LAB — LIPASE, BLOOD: Lipase: 32 U/L (ref 11–51)

## 2019-09-17 MED ORDER — FUROSEMIDE 10 MG/ML IJ SOLN
40.0000 mg | Freq: Once | INTRAMUSCULAR | Status: AC
  Administered 2019-09-17: 40 mg via INTRAVENOUS
  Filled 2019-09-17: qty 4

## 2019-09-17 MED ORDER — AMLODIPINE BESYLATE 5 MG PO TABS
10.0000 mg | ORAL_TABLET | Freq: Once | ORAL | Status: AC
  Administered 2019-09-17: 10 mg via ORAL
  Filled 2019-09-17: qty 2

## 2019-09-17 NOTE — ED Triage Notes (Signed)
Patient arrived via EMS from [facility]. Patient is AOx4 and ambulatory. Facility called 911 due to patient having chest pain that radiates into shoulder and upper back. Patient was smoking ciggerrete when pain presented. EMS provided 324 of Asprin and patient does have Nitro patch put on this morning by facility.  Patient does have Pacemaker.

## 2019-09-17 NOTE — ED Provider Notes (Signed)
[facility] Emergency Department Provider Note    First MD Initiated Contact with Patient 09/17/19 [phone]     (approximate)  I have reviewed the triage vital signs and the nursing notes.   HISTORY  Chief Complaint Chest Pain    HPI Melanie King is a 74 y.o. female extensive past medical history as listed below presents to the ER for several days of midsternal nonradiating chest pain.  States that she was waiting to see if it would go away.  Did take nitro this morning with improvement.  She is currently pain-free.  Does reportedly smoke.  Does not feel more short of breath.  Denies any nausea or vomiting.  No pain ripping or tearing through to her back.   Did have more severe episode of discomfort this morning while she was smoking a cigarette.   Past Medical History:  Diagnosis Date  . Arthritis   . CHF (congestive heart failure) (HCC)   . COPD (chronic obstructive pulmonary disease) (HCC)   . Coronary arteriosclerosis   . DDD (degenerative disc disease), cervical   . DDD (degenerative disc disease), lumbar   . Depression   . Diabetes mellitus without complication (HCC)   . GERD (gastroesophageal reflux disease)   . Hyperlipidemia   . Hypertension   . Osteoarthritis   . Pacemaker   . Tremor    Family History  Problem Relation Age of Onset  . Breast cancer Maternal Aunt 40  . Cancer Mother        colon  . Aneurysm Father    Past Surgical History:  Procedure Laterality Date  . ABDOMINAL HYSTERECTOMY    . BREAST BIOPSY Right 1994   neg cyst removed  . CHOLECYSTECTOMY    . EYE SURGERY  1964, 1966, and 1967   bilateral  . FOOT SURGERY Right    cellulitis  . PACEMAKER INSERTION    . SPINE SURGERY     cyst removed; [facility]   Patient Active Problem List   Diagnosis Date Noted  . Dysphagia 07/19/2019  . Acute respiratory failure due to COVID-19 (HCC) 07/16/2019  . Respiratory distress   . Acute on chronic congestive heart failure  (HCC)   . Acute respiratory failure (HCC) 07/15/2019  . Frequent falls 05/26/2019  . Generalized weakness 05/26/2019  . AKI (acute kidney injury) (HCC) 03/21/2019  . Subdural hematoma (HCC) 03/20/2019  . Hypothyroidism 03/10/2019  . Depression   . Chronic diastolic CHF (congestive heart failure) (HCC)   . Acute metabolic encephalopathy   . Fall   . Tobacco abuse   . C6 cervical fracture (HCC)   . Acute delirium 05/12/2017  . Seizure (HCC) 05/10/2017  . Elevated sedimentation rate 04/04/2017  . Elevated C-reactive protein (CRP) 04/04/2017  . Chest pain, unspecified 04/02/2017  . Bilateral lower extremity pain (Secondary Area of Pain) (R>L) 04/02/2017  . Chronic hip pain, bilateral  (Secondary Area of Pain) (R>L) 04/02/2017  . Chronic bilateral low back pain with bilateral sciatica [facility] Area of Pain) (R>L) 04/02/2017  . Chronic pain syndrome 04/02/2017  . Disorder of bone, unspecified 04/02/2017  . Other long term (current) drug therapy 04/02/2017  . Other specified health status 04/02/2017  . Long term current use of opiate analgesic 04/02/2017  . Hip pain, bilateral 12/06/2016  . Tremor 05/27/2016  . Trigger finger of left hand 02/16/2016  . Osteoarthritis of both hands 02/16/2016  . Angina pectoris (HCC) 01/17/2016  . COPD (chronic obstructive pulmonary disease) (HCC)  01/17/2016  . CAD (coronary artery disease) 01/17/2016  . HLD (hyperlipidemia) 01/17/2016  . Hypertension 01/17/2016  . Sick sinus syndrome (HCC) 01/17/2016  . Chronic pain 01/17/2016  . Personal history of tobacco use, presenting hazards to health 11/16/2015  . Type 2 diabetes mellitus without complication, with long-term current use of insulin (HCC) 05/12/2013      Prior to Admission medications   Medication Sig Start Date End Date Taking? Authorizing Provider  amLODipine (NORVASC) 10 MG tablet TAKE 1 TABLET BY MOUTH EVERY DAY 08/28/16   Gabriel Cirri, NP  aspirin EC 81 MG tablet Take 81 mg by mouth  daily.    [provider]  benztropine (COGENTIN) 0.5 MG tablet Take 0.5 mg by mouth 2 (two) times daily.    [provider]  brexpiprazole (REXULTI) 1 MG TABS tablet Take 1 mg by mouth at bedtime.    [provider]  calcitRIOL (ROCALTROL) 0.25 MCG capsule Take 0.25 mcg by mouth daily.    [provider]  cholecalciferol (VITAMIN D3) 25 MCG (1000 UNIT) tablet Take 1,000 Units by mouth daily.    [provider]  citalopram (CELEXA) 20 MG tablet Take 20 mg by mouth daily.    [provider]  docusate sodium (COLACE) 100 MG capsule Take 100 mg by mouth 2 (two) times daily.    [provider]  famotidine (PEPCID) 40 MG tablet Take 40 mg by mouth daily.    [provider]  fenofibrate (TRICOR) 48 MG tablet Take 48 mg by mouth daily.    [provider]  fluticasone (FLONASE) 50 MCG/ACT nasal spray Place 2 sprays into both nostrils daily.    [provider]  furosemide (LASIX) 20 MG tablet Take 20 mg by mouth daily.    [provider]  gabapentin (NEURONTIN) 100 MG capsule Take 100-300 mg by mouth See admin instructions. Take 1 capsule (100mg ) by mouth every morning and daily at noon and take 3 capsules (300mg ) by mouth every night    [provider]  LANTUS SOLOSTAR 100 UNIT/ML Solostar Pen Inject 24 Units into the skin at bedtime.  03/19/19   [provider]  levothyroxine (SYNTHROID) 125 MCG tablet Take 1 tablet (125 mcg total) by mouth daily before breakfast. 05/28/19   Dhungel, Nishant, MD  losartan (COZAAR) 50 MG tablet Take 50 mg by mouth daily.    [provider]  magnesium oxide (MAG-OX) 400 MG tablet Take 400 mg by mouth daily.    [provider]  meloxicam (MOBIC) 7.5 MG tablet Take 1 tablet (7.5 mg total) by mouth daily. 08/29/19 08/28/20  Triplett, Rulon Eisenmenger B, FNP  metFORMIN (GLUCOPHAGE) 1000 MG tablet Take 0.5 tablets (500 mg total) by mouth 2 (two) times daily with  a meal. 05/28/19   Dhungel, Nishant, MD  metoprolol tartrate (LOPRESSOR) 25 MG tablet Take 25 mg by mouth 2 (two) times daily.    [provider]  montelukast (SINGULAIR) 10 MG tablet Take 10 mg by mouth at bedtime.    [provider]  nitroGLYCERIN (NITRODUR - DOSED IN MG/24 HR) 0.2 mg/hr patch Place 0.2 mg onto the skin daily. leave patch on 12-14 hours, then remove for 10-12 hours prior to applying the next patch    [provider]  primidone (MYSOLINE) 50 MG tablet Take 50 mg by mouth 2 (two) times daily.    [provider]  risperiDONE (RISPERDAL M-TABS) 0.5 MG disintegrating tablet Take 0.5 mg by mouth daily.  [provider]  rosuvastatin (CRESTOR) 40 MG tablet Take 40 mg by mouth at bedtime.    [provider]  sitaGLIPtin (JANUVIA) 50 MG tablet Take 50 mg by mouth daily.    [provider]  traMADol (ULTRAM) 50 MG tablet Take 1 tablet (50 mg total) by mouth every 12 (twelve) hours as needed for moderate pain. 08/29/19   Triplett, Rulon Eisenmenger B, FNP  vitamin B-12 (CYANOCOBALAMIN) 1000 MCG tablet Take 1,000 mcg by mouth daily.    [provider]    Allergies Alprazolam, Fentanyl, Meperidine, and Ranitidine hcl    Social History Social History   Tobacco Use  . Smoking status: Current Every Day Smoker    Packs/day: 1.00    Years: 50.00    Pack years: 50.00    Types: Cigarettes  . Smokeless tobacco: Never Used  Substance Use Topics  . Alcohol use: No  . Drug use: No    Review of Systems Patient denies headaches, rhinorrhea, blurry vision, numbness, shortness of breath, chest pain, edema, cough, abdominal pain, nausea, vomiting, diarrhea, dysuria, fevers, rashes or hallucinations unless otherwise stated above in HPI. ____________________________________________   PHYSICAL EXAM:  VITAL SIGNS: Vitals:   09/17/19 1000 09/17/19 1130  BP: (!) 187/68 (!) 165/87  Pulse: 70 72  Resp: 13 17  Temp:    SpO2: 97%  100%    Constitutional: Alert and oriented.  Eyes: Conjunctivae are normal.  Head: Atraumatic. Nose: No congestion/rhinnorhea. Mouth/Throat: Mucous membranes are moist.   Neck: No stridor. Painless ROM.  Cardiovascular: Normal rate, regular rhythm. Grossly normal heart sounds.  Good peripheral circulation. Respiratory: Normal respiratory effort.  No retractions. Lungs CTAB. Gastrointestinal: Soft and nontender. No distention. No abdominal bruits. No CVA tenderness. Genitourinary:  Musculoskeletal: No lower extremity tenderness nor edema.  No joint effusions. Neurologic:  Normal speech and language. No gross focal neurologic deficits are appreciated. No facial droop Skin:  Skin is warm, dry and intact. No rash noted. Psychiatric: Mood and affect are normal. Speech and behavior are normal.  ____________________________________________   LABS (all labs ordered are listed, but only abnormal results are displayed)  Results for orders placed or performed during the hospital encounter of 09/17/19 (from the past 24 hour(s))  Basic metabolic panel     Status: Abnormal   Collection Time: 09/17/19  8:47 AM  Result Value Ref Range   Sodium 135 135 - 145 mmol/L   Potassium 4.1 3.5 - 5.1 mmol/L   Chloride 99 98 - 111 mmol/L   CO2 25 22 - 32 mmol/L   Glucose, Bld 224 (H) 70 - 99 mg/dL   BUN 18 8 - 23 mg/dL   Creatinine, Ser 9.02 (H) 0.44 - 1.00 mg/dL   Calcium 9.3 8.9 - 40.9 mg/dL   GFR calc non Af Amer 45 (L) >60 mL/min   GFR calc Af Amer 52 (L) >60 mL/min   Anion gap 11 5 - 15  CBC     Status: Abnormal   Collection Time: 09/17/19  8:47 AM  Result Value Ref Range   WBC 8.4 4.0 - 10.5 K/uL   RBC 3.18 (L) 3.87 - 5.11 MIL/uL   Hemoglobin 9.4 (L) 12.0 - 15.0 g/dL   HCT 73.5 (L) 36 - 46 %   MCV 91.2 80.0 - 100.0 fL   MCH 29.6 26.0 - 34.0 pg   MCHC 32.4 30.0 - 36.0 g/dL   RDW 32.9 (H) 92.4 - 26.8 %   Platelets 369 150 - 400 K/uL  nRBC 0.0 0.0 - 0.2 %  Troponin I (High Sensitivity)      Status: None   Collection Time: 09/17/19  8:47 AM  Result Value Ref Range   Troponin I (High Sensitivity) 12 <18 ng/L  Urinalysis, Routine w reflex microscopic     Status: Abnormal   Collection Time: 09/17/19  8:47 AM  Result Value Ref Range   Color, Urine STRAW (A) YELLOW   APPearance CLEAR (A) CLEAR   Specific Gravity, Urine 1.005 1.005 - 1.030   pH 8.0 5.0 - 8.0   Glucose, UA NEGATIVE NEGATIVE mg/dL   Hgb urine dipstick NEGATIVE NEGATIVE   Bilirubin Urine NEGATIVE NEGATIVE   Ketones, ur NEGATIVE NEGATIVE mg/dL   Protein, ur 30 (A) NEGATIVE mg/dL   Nitrite NEGATIVE NEGATIVE   Leukocytes,Ua NEGATIVE NEGATIVE   RBC / HPF 0-5 0 - 5 RBC/hpf   WBC, UA 0-5 0 - 5 WBC/hpf   Bacteria, UA NONE SEEN NONE SEEN   Squamous Epithelial / LPF 0-5 0 - 5  Hepatic function panel     Status: None   Collection Time: 09/17/19  8:47 AM  Result Value Ref Range   Total Protein 7.3 6.5 - 8.1 g/dL   Albumin 3.7 3.5 - 5.0 g/dL   AST 15 15 - 41 U/L   ALT 13 0 - 44 U/L   Alkaline Phosphatase 65 38 - 126 U/L   Total Bilirubin 0.7 0.3 - 1.2 mg/dL   Bilirubin, Direct <9.3 0.0 - 0.2 mg/dL   Indirect Bilirubin NOT CALCULATED 0.3 - 0.9 mg/dL  Lipase, blood     Status: None   Collection Time: 09/17/19  8:47 AM  Result Value Ref Range   Lipase 32 11 - 51 U/L  Troponin I (High Sensitivity)     Status: None   Collection Time: 09/17/19 11:51 AM  Result Value Ref Range   Troponin I (High Sensitivity) 12 <18 ng/L   ____________________________________________  EKG My review and personal interpretation at Time: 8:46   Indication: chest pain  Rate: 70  Rhythm: normal Axis: normal Other: no stemi, nonspecific st abn, c/w previous ____________________________________________  RADIOLOGY  I personally reviewed all radiographic images ordered to evaluate for the above acute complaints and reviewed radiology reports and findings.  These findings were personally discussed with the patient.  Please see medical  record for radiology report.  ____________________________________________   PROCEDURES  Procedure(s) performed:  Procedures    Critical Care performed: no ____________________________________________   INITIAL IMPRESSION / ASSESSMENT AND PLAN / ED COURSE  Pertinent labs & imaging results that were available during my care of the patient were reviewed by me and considered in my medical decision making (see chart for details).   DDX: ACS, pericarditis, esophagitis, boerhaaves, pe, dissection, pna, bronchitis, costochondritis   Melanie King is a 74 y.o. who presents to the ED with symptoms as described above.  Patient pleasant and nontoxic appearing.  Mildly hypertensive.  Will treat blood pressure.  Will order serial enzymes.  Her EKG is abnormal but consistent with previous and likely her baseline.  Does not seem consistent with dissection or PE.  Her abdominal exam is soft and benign.  Will observe in the ER.  Clinical Course as of Sep 16 1232  Wed Sep 17, 2019  1113 Chest x-ray does show some pulmonary vascular congestion.  Given her hypertension will order dose of Lasix.  Troponin negative.  Remainder blood work-up is reassuring.   [PR]  1231 Patient feels  well.  No acute distress.  Is not having any pain.  Her work-up is reassuring.  I have been having complaint of bronchitis.  She was actively smoking at the time but does not show any signs of acute COPD exacerbation.  Blood pressure is stable.  She is diuresing well after dose of Lasix.  I do believe she is appropriate for outpatient follow-up.   [PR]    Clinical Course User Index [PR] Willy Eddy, MD    The patient was evaluated in Emergency Department today for the symptoms described in the history of present illness. He/she was evaluated in the context of the global COVID-19 pandemic, which necessitated consideration that the patient might be at risk for infection with the SARS-CoV-2 virus that causes COVID-19.  Institutional protocols and algorithms that pertain to the evaluation of patients at risk for COVID-19 are in a state of rapid change based on information released by regulatory bodies including the CDC and federal and state organizations. These policies and algorithms were followed during the patient's care in the ED.  As part of my medical decision making, I reviewed the following data within the electronic MEDICAL RECORD NUMBER Nursing notes reviewed and incorporated, Labs reviewed, notes from prior ED visits and [place] Controlled Substance Database   ____________________________________________   FINAL CLINICAL IMPRESSION(S) / ED DIAGNOSES  Final diagnoses:  Atypical chest pain      NEW MEDICATIONS STARTED DURING THIS VISIT:  New Prescriptions   No medications on file     Note:  This document was prepared using Dragon voice recognition software and may include unintentional dictation errors.    Willy Eddy, MD 09/17/19 1234

## 2019-09-17 NOTE — ED Notes (Signed)
[facility] called for transport back to [facility] [address]

## 2019-09-23 ENCOUNTER — Ambulatory Visit
Admission: RE | Admit: 2019-09-23 | Discharge: 2019-09-23 | Disposition: A | Discharge status: Home / Self Care | Payer: Medicare Other | Source: Ambulatory Visit | Attending: Acute Care | Admitting: Acute Care

## 2019-09-23 ENCOUNTER — Other Ambulatory Visit: Payer: Self-pay

## 2019-09-23 DIAGNOSIS — R29898 Other symptoms and signs involving the musculoskeletal system: Secondary | ICD-10-CM | POA: Diagnosis present

## 2019-10-07 ENCOUNTER — Ambulatory Visit: Payer: Medicare Other | Admitting: Family

## 2019-10-23 IMAGING — CR DG RIBS W/ CHEST 3+V*R*
3 series · 3 of 3 positions shown · non-contrast
Comparison: Chest x-ray 03/28/2017

CLINICAL DATA: Right rib pain with right-sided abdominal/flank pain
after fall a few weeks ago.

EXAM:
RIGHT RIBS AND CHEST - 3+ VIEW

[chest pa]
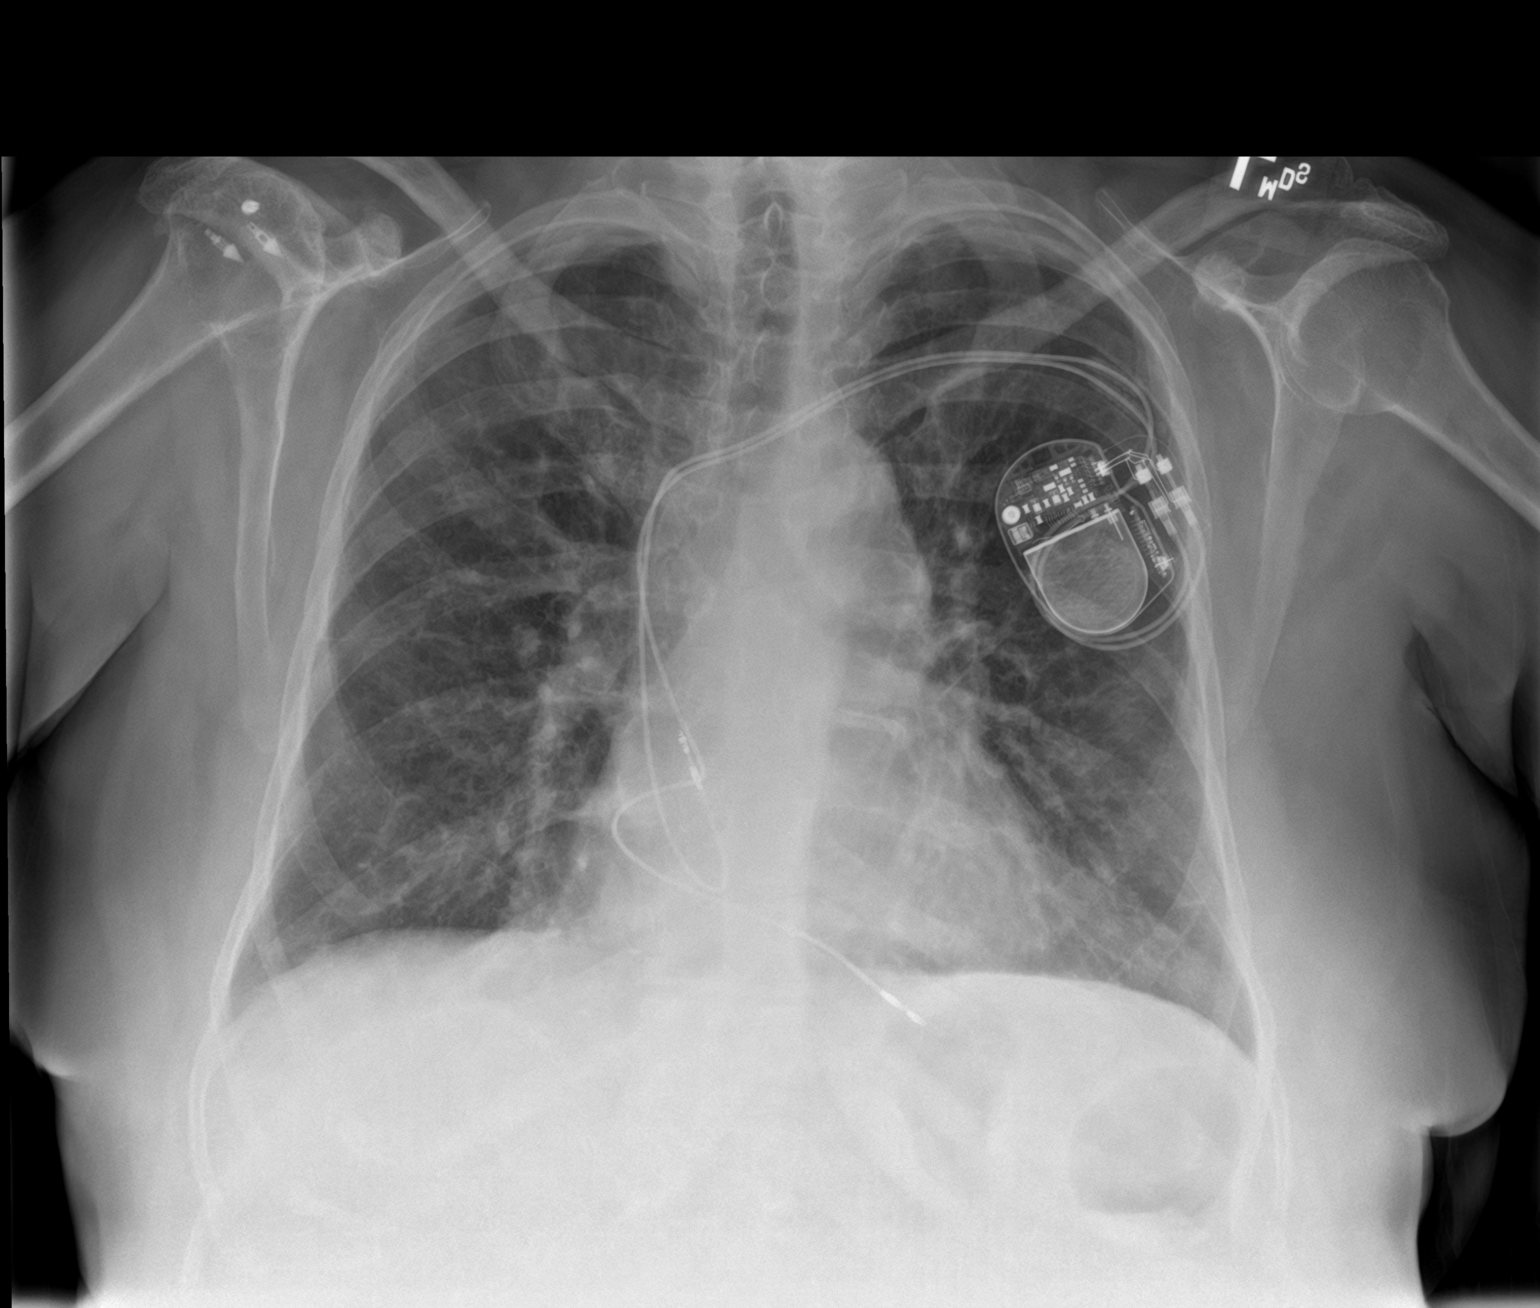

[rib ap]
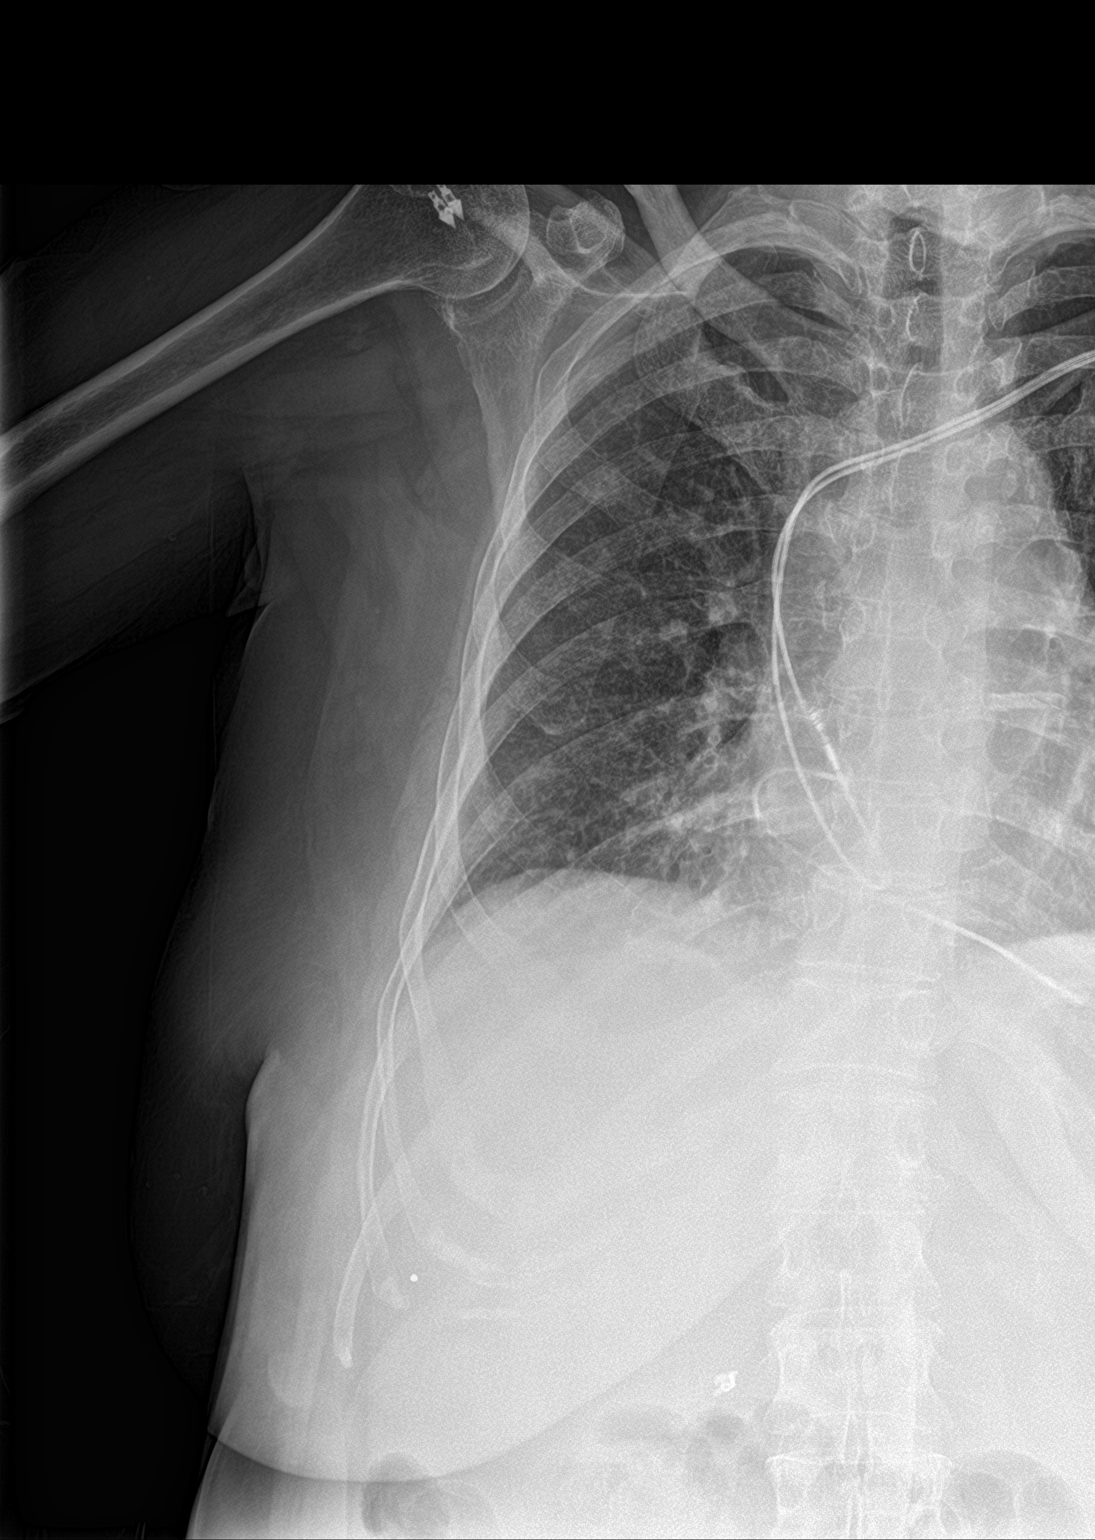

[rib ap obl]
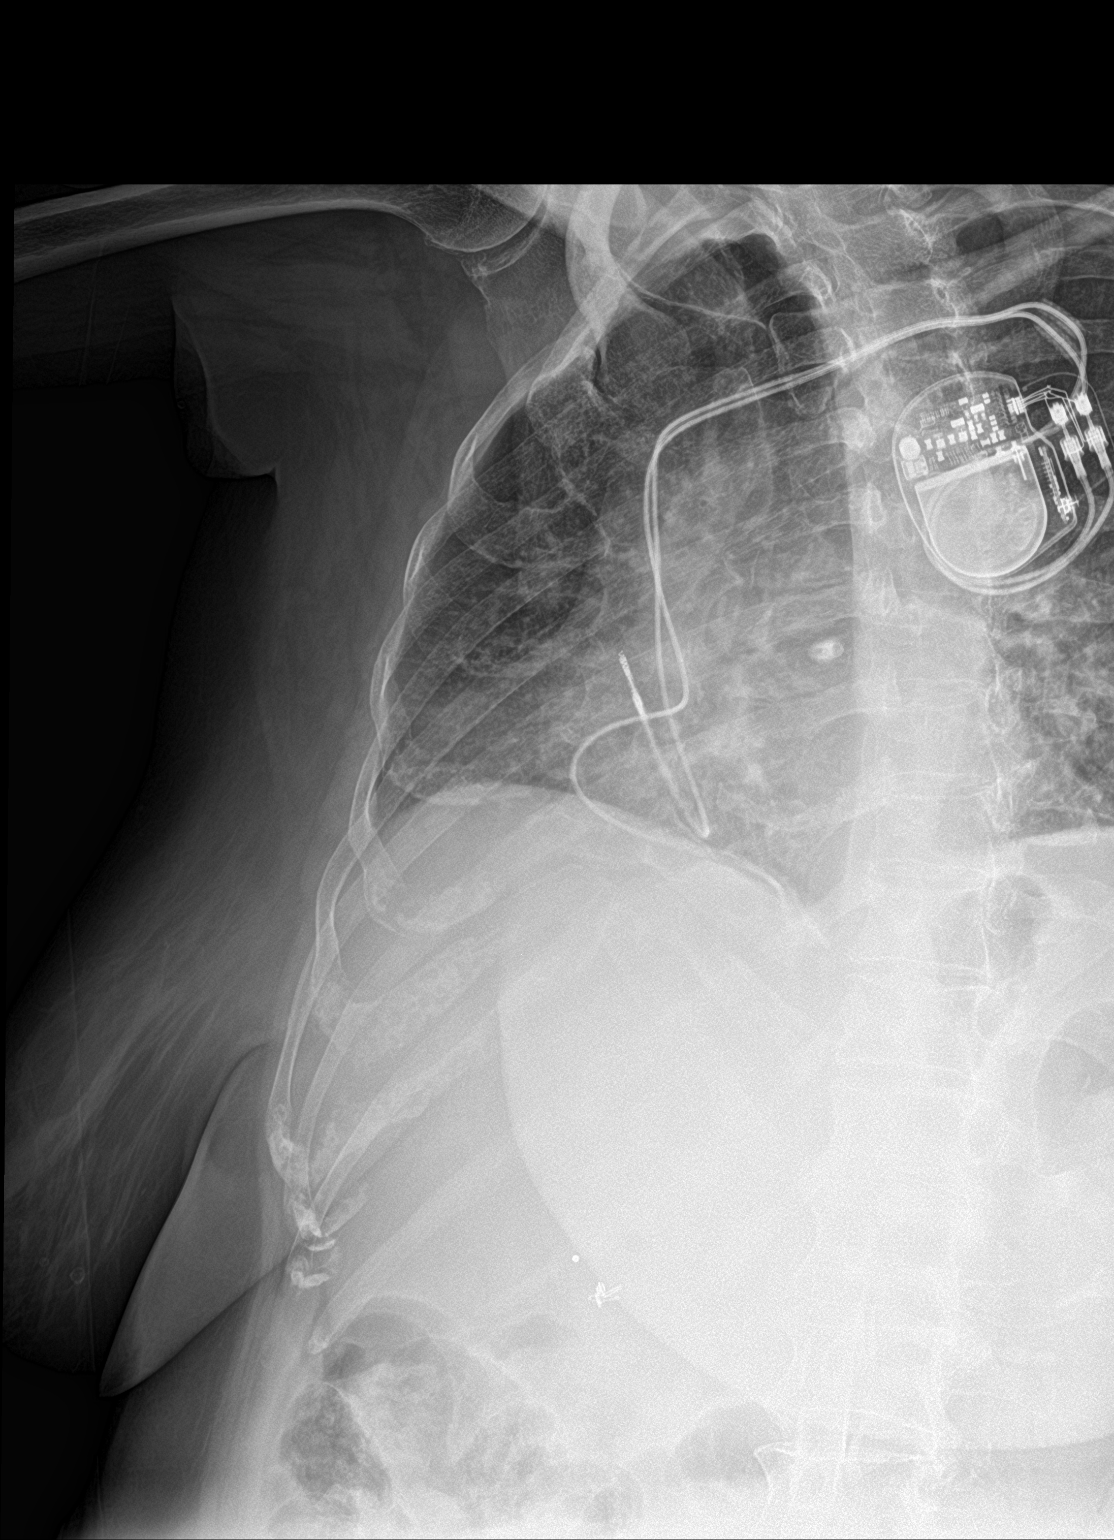

[3 of 3 positions shown; findings below may reference images not displayed]

FINDINGS: Left-sided pacemaker unchanged. Lungs are adequately inflated
without focal airspace consolidation or effusion. No pneumothorax.
Cardiomediastinal silhouette is within normal. No evidence of right
rib fracture.
IMPRESSION: No acute findings.

## 2019-10-24 ENCOUNTER — Emergency Department: Payer: Medicare Other

## 2019-10-24 ENCOUNTER — Other Ambulatory Visit: Payer: Self-pay

## 2019-10-24 DIAGNOSIS — Z794 Long term (current) use of insulin: Secondary | ICD-10-CM | POA: Insufficient documentation

## 2019-10-24 DIAGNOSIS — R519 Headache, unspecified: Secondary | ICD-10-CM | POA: Insufficient documentation

## 2019-10-24 DIAGNOSIS — W010XXA Fall on same level from slipping, tripping and stumbling without subsequent striking against object, initial encounter: Secondary | ICD-10-CM | POA: Insufficient documentation

## 2019-10-24 DIAGNOSIS — E119 Type 2 diabetes mellitus without complications: Secondary | ICD-10-CM | POA: Insufficient documentation

## 2019-10-24 DIAGNOSIS — Z7982 Long term (current) use of aspirin: Secondary | ICD-10-CM | POA: Diagnosis not present

## 2019-10-24 DIAGNOSIS — I110 Hypertensive heart disease with heart failure: Secondary | ICD-10-CM | POA: Diagnosis not present

## 2019-10-24 DIAGNOSIS — J449 Chronic obstructive pulmonary disease, unspecified: Secondary | ICD-10-CM | POA: Insufficient documentation

## 2019-10-24 DIAGNOSIS — Z950 Presence of cardiac pacemaker: Secondary | ICD-10-CM | POA: Insufficient documentation

## 2019-10-24 DIAGNOSIS — I5032 Chronic diastolic (congestive) heart failure: Secondary | ICD-10-CM | POA: Diagnosis not present

## 2019-10-24 DIAGNOSIS — Z79899 Other long term (current) drug therapy: Secondary | ICD-10-CM | POA: Diagnosis not present

## 2019-10-24 DIAGNOSIS — I2510 Atherosclerotic heart disease of native coronary artery without angina pectoris: Secondary | ICD-10-CM | POA: Insufficient documentation

## 2019-10-24 DIAGNOSIS — F17210 Nicotine dependence, cigarettes, uncomplicated: Secondary | ICD-10-CM | POA: Diagnosis not present

## 2019-10-24 NOTE — ED Triage Notes (Signed)
Pt resident of the [facility]. Pt states she stumbled and fell striking head. Pt complains of neck pain, posterior skull pain. Pt with rigid c collar in place. Pt is HOH.

## 2019-10-24 NOTE — ED Notes (Signed)
Pt to ED from the [facility] for fall. Possible left hip fx. Pt did hit head, not on blood thinners.

## 2019-10-24 NOTE — ED Notes (Signed)
Melanie King came to ck on pt informed pt is waiting to be seen by the Dr.

## 2019-10-25 ENCOUNTER — Emergency Department: Payer: Medicare Other

## 2019-10-25 ENCOUNTER — Emergency Department
Admission: EM | Admit: 2019-10-25 | Discharge: 2019-10-25 | Disposition: A | Discharge status: Home / Self Care | Payer: Medicare Other | Attending: Emergency Medicine | Admitting: Emergency Medicine

## 2019-10-25 DIAGNOSIS — W19XXXA Unspecified fall, initial encounter: Secondary | ICD-10-CM

## 2019-10-25 DIAGNOSIS — R519 Headache, unspecified: Secondary | ICD-10-CM | POA: Diagnosis not present

## 2019-10-25 NOTE — Discharge Instructions (Addendum)

## 2019-10-25 NOTE — ED Notes (Signed)
Attempted to call the [facility]. No answer. Pt is to return via EMS, discharge pw reviewed with pt, pt states understanding and gave verbal consent for DC.

## 2019-10-25 NOTE — ED Provider Notes (Signed)
[facility] Emergency Department Provider Note  ____________________________________________  Time seen: Approximately 2:46 AM  I have reviewed the triage vital signs and the nursing notes.   HISTORY  Chief Complaint Fall   HPI Melanie King is a 74 y.o. female with several medical problems as listed below who presents for evaluation after mechanical fall.   Patient was walking when she stumbled and fell striking her head on the floor. No LOC.  She is complaining of posterior headache and neck pain upon arrival to the emergency room.  She was also complaining of left hip pain.  During my evaluation patient denies any pain including headache, neck pain, back pain, and extremity pain.  Patient reports that the fall was mechanical in nature.  The fall was witnessed by nursing staff.  Past Medical History:  Diagnosis Date  . Arthritis   . CHF (congestive heart failure) (HCC)   . COPD (chronic obstructive pulmonary disease) (HCC)   . Coronary arteriosclerosis   . DDD (degenerative disc disease), cervical   . DDD (degenerative disc disease), lumbar   . Depression   . Diabetes mellitus without complication (HCC)   . GERD (gastroesophageal reflux disease)   . Hyperlipidemia   . Hypertension   . Osteoarthritis   . Pacemaker   . Tremor     Patient Active Problem List   Diagnosis Date Noted  . Dysphagia 07/19/2019  . Acute respiratory failure due to COVID-19 (HCC) 07/16/2019  . Respiratory distress   . Acute on chronic congestive heart failure (HCC)   . Acute respiratory failure (HCC) 07/15/2019  . Frequent falls 05/26/2019  . Generalized weakness 05/26/2019  . AKI (acute kidney injury) (HCC) 03/21/2019  . Subdural hematoma (HCC) 03/20/2019  . Hypothyroidism 03/10/2019  . Depression   . Chronic diastolic CHF (congestive heart failure) (HCC)   . Acute metabolic encephalopathy   . Fall   . Tobacco abuse   . C6 cervical fracture (HCC)   . Acute  delirium 05/12/2017  . Seizure (HCC) 05/10/2017  . Elevated sedimentation rate 04/04/2017  . Elevated C-reactive protein (CRP) 04/04/2017  . Chest pain, unspecified 04/02/2017  . Bilateral lower extremity pain (Secondary Area of Pain) (R>L) 04/02/2017  . Chronic hip pain, bilateral  (Secondary Area of Pain) (R>L) 04/02/2017  . Chronic bilateral low back pain with bilateral sciatica [facility] Area of Pain) (R>L) 04/02/2017  . Chronic pain syndrome 04/02/2017  . Disorder of bone, unspecified 04/02/2017  . Other long term (current) drug therapy 04/02/2017  . Other specified health status 04/02/2017  . Long term current use of opiate analgesic 04/02/2017  . Hip pain, bilateral 12/06/2016  . Tremor 05/27/2016  . Trigger finger of left hand 02/16/2016  . Osteoarthritis of both hands 02/16/2016  . Angina pectoris (HCC) 01/17/2016  . COPD (chronic obstructive pulmonary disease) (HCC) 01/17/2016  . CAD (coronary artery disease) 01/17/2016  . HLD (hyperlipidemia) 01/17/2016  . Hypertension 01/17/2016  . Sick sinus syndrome (HCC) 01/17/2016  . Chronic pain 01/17/2016  . Personal history of tobacco use, presenting hazards to health 11/16/2015  . Type 2 diabetes mellitus without complication, with long-term current use of insulin (HCC) 05/12/2013    Past Surgical History:  Procedure Laterality Date  . ABDOMINAL HYSTERECTOMY    . BREAST BIOPSY Right 1994   neg cyst removed  . CHOLECYSTECTOMY    . EYE SURGERY  1964, 1966, and 1967   bilateral  . FOOT SURGERY Right    cellulitis  .  PACEMAKER INSERTION    . SPINE SURGERY     cyst removed; [facility]    Prior to Admission medications   Medication Sig Start Date End Date Taking? Authorizing Provider  amLODipine (NORVASC) 10 MG tablet TAKE 1 TABLET BY MOUTH EVERY DAY 08/28/16   Gabriel Cirri, NP  aspirin EC 81 MG tablet Take 81 mg by mouth daily.    [provider]  benztropine (COGENTIN) 0.5 MG tablet Take 0.5 mg by mouth 2  (two) times daily.    [provider]  brexpiprazole (REXULTI) 1 MG TABS tablet Take 1 mg by mouth at bedtime.    [provider]  calcitRIOL (ROCALTROL) 0.25 MCG capsule Take 0.25 mcg by mouth daily.    [provider]  cholecalciferol (VITAMIN D3) 25 MCG (1000 UNIT) tablet Take 1,000 Units by mouth daily.    [provider]  citalopram (CELEXA) 20 MG tablet Take 20 mg by mouth daily.    [provider]  docusate sodium (COLACE) 100 MG capsule Take 100 mg by mouth 2 (two) times daily.    [provider]  famotidine (PEPCID) 40 MG tablet Take 40 mg by mouth daily.    [provider]  fenofibrate (TRICOR) 48 MG tablet Take 48 mg by mouth daily.    [provider]  fluticasone (FLONASE) 50 MCG/ACT nasal spray Place 2 sprays into both nostrils daily.    [provider]  furosemide (LASIX) 20 MG tablet Take 20 mg by mouth daily.    [provider]  gabapentin (NEURONTIN) 100 MG capsule Take 100-300 mg by mouth See admin instructions. Take 1 capsule (100mg ) by mouth every morning and daily at noon and take 3 capsules (300mg ) by mouth every night    [provider]  LANTUS SOLOSTAR 100 UNIT/ML Solostar Pen Inject 24 Units into the skin at bedtime.  03/19/19   [provider]  levothyroxine (SYNTHROID) 125 MCG tablet Take 1 tablet (125 mcg total) by mouth daily before breakfast. 05/28/19   Dhungel, Nishant, MD  losartan (COZAAR) 50 MG tablet Take 50 mg by mouth daily.    [provider]  magnesium oxide (MAG-OX) 400 MG tablet Take 400 mg by mouth daily.    [provider]  meloxicam (MOBIC) 7.5 MG tablet Take 1 tablet (7.5 mg total) by mouth daily. 08/29/19 08/28/20  Triplett, 08/31/19 B, FNP  metFORMIN (GLUCOPHAGE) 1000 MG tablet Take 0.5 tablets (500 mg total) by mouth 2 (two) times daily with a meal. 05/28/19   Dhungel, Nishant, MD  metoprolol tartrate (LOPRESSOR) 25 MG tablet Take 25 mg  by mouth 2 (two) times daily.    [provider]  montelukast (SINGULAIR) 10 MG tablet Take 10 mg by mouth at bedtime.    [provider]  nitroGLYCERIN (NITRODUR - DOSED IN MG/24 HR) 0.2 mg/hr patch Place 0.2 mg onto the skin daily. leave patch on 12-14 hours, then remove for 10-12 hours prior to applying the next patch    [provider]  primidone (MYSOLINE) 50 MG tablet Take 50 mg by mouth 2 (two) times daily.    [provider]  risperiDONE (RISPERDAL M-TABS) 0.5 MG disintegrating tablet Take 0.5 mg by mouth daily.     [provider]  rosuvastatin (CRESTOR) 40 MG tablet Take 40 mg by mouth at bedtime.    [provider]  sitaGLIPtin (JANUVIA) 50 MG tablet Take 50 mg by mouth daily.    [provider]  traMADol (ULTRAM) 50 MG tablet Take 1 tablet (50 mg total) by mouth every 12 (twelve) hours as needed for moderate pain. 08/29/19   Triplett, Rulon Eisenmenger B, FNP  vitamin B-12 (CYANOCOBALAMIN) 1000 MCG tablet Take 1,000 mcg by mouth daily.    [provider]    Allergies Alprazolam, Fentanyl, Meperidine, and Ranitidine hcl  Family History  Problem Relation Age of Onset  . Breast cancer Maternal Aunt 40  . Cancer Mother        colon  . Aneurysm Father     Social History Social History   Tobacco Use  . Smoking status: Current Every Day Smoker    Packs/day: 1.00    Years: 50.00    Pack years: 50.00    Types: Cigarettes  . Smokeless tobacco: Never Used  Substance Use Topics  . Alcohol use: No  . Drug use: No    Review of Systems  Constitutional: Negative for fever. Eyes: Negative for visual changes. ENT: Negative for facial injury. + neck pain Cardiovascular: Negative for chest injury. Respiratory: Negative for shortness of breath. Negative for chest wall injury. Gastrointestinal: Negative for abdominal pain or injury. Genitourinary: Negative for dysuria. Musculoskeletal: Negative for back injury, + L hip  pain Skin: Negative for laceration/abrasions. Neurological: + head injury.   ____________________________________________   PHYSICAL EXAM:  VITAL SIGNS: ED Triage Vitals  Enc Vitals Group     BP 10/24/19 1859 (!) 157/47     Pulse Rate 10/24/19 1859 70     Resp 10/24/19 1859 16     Temp 10/24/19 1904 98.4 F (36.9 C)     Temp Source 10/24/19 1859 Oral     SpO2 10/24/19 1859 95 %     Weight 10/24/19 1859 140 lb (63.5 kg)     Height 10/24/19 1859  (1.575 m)     Head Circumference --      Peak Flow --      Pain Score --      Pain Loc --      Pain Edu? --      Excl. in GC? --     Full spinal precautions maintained throughout the trauma exam. Constitutional: Alert and oriented. No acute distress. Does not appear intoxicated. HEENT Head: Normocephalic and atraumatic. Face: No facial bony tenderness. Stable midface Ears: No hemotympanum bilaterally. No Battle sign Eyes: No eye injury. PERRL. No raccoon eyes Nose: Nontender. No epistaxis. No rhinorrhea Mouth/Throat: Mucous membranes are moist. No oropharyngeal blood. No dental injury. Airway patent without stridor. Normal voice. Neck: no C-collar. No midline c-spine tenderness.  Cardiovascular: Normal rate, regular rhythm. Normal and symmetric distal pulses are present in all extremities. Pulmonary/Chest: Chest wall is stable and nontender to palpation/compression. Normal respiratory effort. Breath sounds are normal. No crepitus.  Abdominal: Soft, nontender, non distended. Musculoskeletal: Nontender with normal full range of motion in all extremities. No deformities. No thoracic or lumbar midline spinal tenderness. Pelvis is stable. Skin: Skin is warm, dry and intact. No abrasions or contutions. Psychiatric: Speech and behavior are appropriate. Neurological: Normal speech and language. Moves all extremities to command. No gross focal neurologic deficits are appreciated.  Glascow Coma Score: 4 - Opens eyes on own 6 -  Follows simple motor commands 5 - Alert and oriented GCS: 15   ____________________________________________   LABS (all labs ordered are listed, but only abnormal results are displayed)  Labs Reviewed - No data to display ____________________________________________  EKG  none  ____________________________________________  RADIOLOGY  I have  personally reviewed the images performed during this visit and I agree with the Radiologist's read.   Interpretation by Radiologist:  CT Head Wo Contrast  Result Date: 10/24/2019 CLINICAL DATA:  74 year old female with trauma. EXAM: CT HEAD WITHOUT CONTRAST CT CERVICAL SPINE WITHOUT CONTRAST TECHNIQUE: Multidetector CT imaging of the head and cervical spine was performed following the standard protocol without intravenous contrast. Multiplanar CT image reconstructions of the cervical spine were also generated. COMPARISON:  Head CT dated 09/23/2019. FINDINGS: CT HEAD FINDINGS Brain: There is mild age-related atrophy and chronic microvascular ischemic changes. Subcentimeter bilateral basal ganglia old lacunar infarcts noted. There is no acute intracranial hemorrhage. No mass effect midline shift. No extra-axial fluid collection. Vascular: No hyperdense vessel or unexpected calcification. Skull: Normal. Negative for fracture or focal lesion. Sinuses/Orbits: The visualized paranasal sinuses and mastoid air cells are clear. Other: None CT CERVICAL SPINE FINDINGS Alignment: No acute subluxation. There is mild reversal of normal cervical lordosis which may be positional or due to muscle spasm Skull base and vertebrae: No acute fracture Soft tissues and spinal canal: No prevertebral fluid or swelling. No visible canal hematoma. Disc levels: Multilevel degenerative changes with multilevel facet arthropathy. Upper chest: There is diffuse interstitial and interlobular septal prominence, possibly edema. Other: A pacemaker device is partially visualized. There are  bilateral carotid bulb calcified plaques. IMPRESSION: 1. No acute intracranial pathology. Mild age-related atrophy and chronic microvascular ischemic changes. 2. No acute/traumatic cervical spine pathology. Multilevel degenerative changes. Electronically Signed   By: Elgie Collard M.D.   On: 10/24/2019 20:27   CT Cervical Spine Wo Contrast  Result Date: 10/24/2019 CLINICAL DATA:  74 year old female with trauma. EXAM: CT HEAD WITHOUT CONTRAST CT CERVICAL SPINE WITHOUT CONTRAST TECHNIQUE: Multidetector CT imaging of the head and cervical spine was performed following the standard protocol without intravenous contrast. Multiplanar CT image reconstructions of the cervical spine were also generated. COMPARISON:  Head CT dated 09/23/2019. FINDINGS: CT HEAD FINDINGS Brain: There is mild age-related atrophy and chronic microvascular ischemic changes. Subcentimeter bilateral basal ganglia old lacunar infarcts noted. There is no acute intracranial hemorrhage. No mass effect midline shift. No extra-axial fluid collection. Vascular: No hyperdense vessel or unexpected calcification. Skull: Normal. Negative for fracture or focal lesion. Sinuses/Orbits: The visualized paranasal sinuses and mastoid air cells are clear. Other: None CT CERVICAL SPINE FINDINGS Alignment: No acute subluxation. There is mild reversal of normal cervical lordosis which may be positional or due to muscle spasm Skull base and vertebrae: No acute fracture Soft tissues and spinal canal: No prevertebral fluid or swelling. No visible canal hematoma. Disc levels: Multilevel degenerative changes with multilevel facet arthropathy. Upper chest: There is diffuse interstitial and interlobular septal prominence, possibly edema. Other: A pacemaker device is partially visualized. There are bilateral carotid bulb calcified plaques. IMPRESSION: 1. No acute intracranial pathology. Mild age-related atrophy and chronic microvascular ischemic changes. 2. No  acute/traumatic cervical spine pathology. Multilevel degenerative changes. Electronically Signed   By: Elgie Collard M.D.   On: 10/24/2019 20:27   DG Hip Unilat W or Wo Pelvis 2-3 Views Left  Result Date: 10/25/2019 CLINICAL DATA:  Fall, possible left hip fracture EXAM: DG HIP (WITH OR WITHOUT PELVIS) 2-3V LEFT COMPARISON:  Radiograph 05/31/2019 FINDINGS: Bones of the pelvis appear intact and congruent. Proximal femora are also intact and normally located within the acetabula. Degenerative changes in the lower lumbar spine, hips and pelvis. Mild lateral soft tissue swelling of the left hip. Vascular calcifications noted in the pelvis. No other  acute or significant osseous or soft tissue abnormalities. IMPRESSION: Mild lateral soft tissue swelling of the left hip. No acute osseous abnormality. Electronically Signed   By: Kreg Shropshire M.D.   On: 10/25/2019 02:14     ____________________________________________   PROCEDURES  Procedure(s) performed:  .1-3 Lead EKG Interpretation Performed by: Nita Sickle, MD Authorized by: Nita Sickle, MD     Interpretation: non-specific     ECG rate assessment: normal     Rhythm: sinus rhythm     Ectopy: none     Critical Care performed:  None ____________________________________________   INITIAL IMPRESSION / ASSESSMENT AND PLAN / ED COURSE  74 y.o. female with several medical problems as listed below who presents for evaluation after mechanical fall.   CT head and cervical spine with no acute findings, confirmed by radiology.  X-ray of the hip negative.  Patient able to ambulate with no pain. Fall was mechanical in nature witnessed by staff and confirmed by patient. Patient stable for discharge back to nursing home. Old medical records reviewed.        ____________________________________________  Please note:  Patient was evaluated in Emergency Department today for the symptoms described in the history of present illness. Patient  was evaluated in the context of the global COVID-19 pandemic, which necessitated consideration that the patient might be at risk for infection with the SARS-CoV-2 virus that causes COVID-19. Institutional protocols and algorithms that pertain to the evaluation of patients at risk for COVID-19 are in a state of rapid change based on information released by regulatory bodies including the CDC and federal and state organizations. These policies and algorithms were followed during the patient's care in the ED.  Some ED evaluations and interventions may be delayed as a result of limited staffing during the pandemic.   ____________________________________________   FINAL CLINICAL IMPRESSION(S) / ED DIAGNOSES   Final diagnoses:  Fall, initial encounter      NEW MEDICATIONS STARTED DURING THIS VISIT:  ED Discharge Orders    None       Note:  This document was prepared using Dragon voice recognition software and may include unintentional dictation errors.    Don Perking, [place], MD 10/25/19 [phone]

## 2019-10-28 ENCOUNTER — Ambulatory Visit: Payer: Medicare Other | Attending: Family | Admitting: Family

## 2019-10-28 ENCOUNTER — Other Ambulatory Visit: Payer: Self-pay

## 2019-10-28 ENCOUNTER — Encounter: Payer: Self-pay | Admitting: Family

## 2019-10-28 VITALS — BP 133/62 | HR 76 | Resp 18 | Ht 61.0 in | Wt 144.2 lb

## 2019-10-28 DIAGNOSIS — Z79899 Other long term (current) drug therapy: Secondary | ICD-10-CM | POA: Insufficient documentation

## 2019-10-28 DIAGNOSIS — F419 Anxiety disorder, unspecified: Secondary | ICD-10-CM | POA: Diagnosis not present

## 2019-10-28 DIAGNOSIS — G8929 Other chronic pain: Secondary | ICD-10-CM | POA: Insufficient documentation

## 2019-10-28 DIAGNOSIS — Z7982 Long term (current) use of aspirin: Secondary | ICD-10-CM | POA: Diagnosis not present

## 2019-10-28 DIAGNOSIS — J449 Chronic obstructive pulmonary disease, unspecified: Secondary | ICD-10-CM | POA: Insufficient documentation

## 2019-10-28 DIAGNOSIS — Z950 Presence of cardiac pacemaker: Secondary | ICD-10-CM | POA: Insufficient documentation

## 2019-10-28 DIAGNOSIS — Z885 Allergy status to narcotic agent status: Secondary | ICD-10-CM | POA: Diagnosis not present

## 2019-10-28 DIAGNOSIS — M5136 Other intervertebral disc degeneration, lumbar region: Secondary | ICD-10-CM | POA: Diagnosis not present

## 2019-10-28 DIAGNOSIS — M5030 Other cervical disc degeneration, unspecified cervical region: Secondary | ICD-10-CM | POA: Insufficient documentation

## 2019-10-28 DIAGNOSIS — I5032 Chronic diastolic (congestive) heart failure: Secondary | ICD-10-CM | POA: Diagnosis present

## 2019-10-28 DIAGNOSIS — I2510 Atherosclerotic heart disease of native coronary artery without angina pectoris: Secondary | ICD-10-CM | POA: Diagnosis not present

## 2019-10-28 DIAGNOSIS — E785 Hyperlipidemia, unspecified: Secondary | ICD-10-CM | POA: Diagnosis not present

## 2019-10-28 DIAGNOSIS — E119 Type 2 diabetes mellitus without complications: Secondary | ICD-10-CM | POA: Insufficient documentation

## 2019-10-28 DIAGNOSIS — Z9049 Acquired absence of other specified parts of digestive tract: Secondary | ICD-10-CM | POA: Diagnosis not present

## 2019-10-28 DIAGNOSIS — Z720 Tobacco use: Secondary | ICD-10-CM

## 2019-10-28 DIAGNOSIS — M1990 Unspecified osteoarthritis, unspecified site: Secondary | ICD-10-CM | POA: Diagnosis not present

## 2019-10-28 DIAGNOSIS — Z794 Long term (current) use of insulin: Secondary | ICD-10-CM

## 2019-10-28 DIAGNOSIS — K219 Gastro-esophageal reflux disease without esophagitis: Secondary | ICD-10-CM | POA: Insufficient documentation

## 2019-10-28 DIAGNOSIS — Z7984 Long term (current) use of oral hypoglycemic drugs: Secondary | ICD-10-CM | POA: Insufficient documentation

## 2019-10-28 DIAGNOSIS — N1832 Chronic kidney disease, stage 3b: Secondary | ICD-10-CM

## 2019-10-28 DIAGNOSIS — I110 Hypertensive heart disease with heart failure: Secondary | ICD-10-CM | POA: Insufficient documentation

## 2019-10-28 DIAGNOSIS — E1122 Type 2 diabetes mellitus with diabetic chronic kidney disease: Secondary | ICD-10-CM

## 2019-10-28 DIAGNOSIS — I10 Essential (primary) hypertension: Secondary | ICD-10-CM

## 2019-10-28 DIAGNOSIS — F17210 Nicotine dependence, cigarettes, uncomplicated: Secondary | ICD-10-CM | POA: Insufficient documentation

## 2019-10-28 DIAGNOSIS — F329 Major depressive disorder, single episode, unspecified: Secondary | ICD-10-CM | POA: Insufficient documentation

## 2019-10-28 NOTE — Progress Notes (Signed)
Patient ID: Melanie King, female    DOB: June 23, 1945, 74 y.o.   MRN: [id number]  HPI  Melanie King is a 74 y/o female with a history of CAD, DM, hyperlipidemia, HTN, GERD, depression, pacemaker, COPD, current tobacco use and chronic heart failure.   Echo report from 07/15/19 reviewed and showed an EF of 60-65% along with moderate LVH, mild LAE and trace MR.   Was in the ED 10/25/19 after a mechanical fall. Hit her head on the floor. Head CT, cervical spine and hip xray are negative. Was in the ED 09/17/19 due to atypical chest pain. Took NTG with improvement of symptoms. CXR showed vascular congestion and lasix given. EKG abnormal but consistent with previous ones.     Admitted 07/15/19 due to HF/ COPD exacerbation. Cardiology and GI consults obtained. Initially given IV lasix and then transitioned to oral diuretics. Placed on bipap and then weaned off of that. Given antibiotics, steroids and nebulizers. Discharged after 5 days to SNF. Was in the ED 06/12/19 due to viral illness where she was treated and released.    She presents today for a follow-up visit with a chief complaint of minimal shortness of breath upon moderate exertion. She describes this as chronic in nature having been present for several years. She has associated fatigue, intermittent chest pain, dizziness, anxiety and chronic pain along with this. She denies any difficulty sleeping, abdominal distention, palpitations, pedal edema, cough or weight gain.    Past Medical History:  Diagnosis Date  . Arthritis   . CHF (congestive heart failure) (HCC)   . COPD (chronic obstructive pulmonary disease) (HCC)   . Coronary arteriosclerosis   . DDD (degenerative disc disease), cervical   . DDD (degenerative disc disease), lumbar   . Depression   . Diabetes mellitus without complication (HCC)   . GERD (gastroesophageal reflux disease)   . Hyperlipidemia   . Hypertension   . Osteoarthritis   . Pacemaker   . Tremor    Past Surgical History:   Procedure Laterality Date  . ABDOMINAL HYSTERECTOMY    . BREAST BIOPSY Right 1994   neg cyst removed  . CHOLECYSTECTOMY    . EYE SURGERY  1964, 1966, and 1967   bilateral  . FOOT SURGERY Right    cellulitis  . PACEMAKER INSERTION    . SPINE SURGERY     cyst removed; [facility]   Family History  Problem Relation Age of Onset  . Breast cancer Maternal Aunt 40  . Cancer Mother        colon  . Aneurysm Father    Social History   Tobacco Use  . Smoking status: Current Every Day Smoker    Packs/day: 1.00    Years: 50.00    Pack years: 50.00    Types: Cigarettes  . Smokeless tobacco: Never Used  Substance Use Topics  . Alcohol use: No   Allergies  Allergen Reactions  . Alprazolam Swelling  . Fentanyl Other (See Comments)    "burning and hot"  . Meperidine Itching    Other reaction(s): Other (See Comments)  . Ranitidine Hcl     Other reaction(s): Other (See Comments)   Prior to Admission medications   Medication Sig Start Date End Date Taking? Authorizing Provider  amLODipine (NORVASC) 10 MG tablet TAKE 1 TABLET BY MOUTH EVERY DAY 08/28/16  Yes Gabriel Cirri, NP  aspirin EC 81 MG tablet Take 81 mg by mouth daily.   Yes [provider]  benztropine (  COGENTIN) 0.5 MG tablet Take 0.5 mg by mouth 2 (two) times daily.   Yes [provider]  brexpiprazole (REXULTI) 1 MG TABS tablet Take 1 mg by mouth at bedtime.   Yes [provider]  calcitRIOL (ROCALTROL) 0.25 MCG capsule Take 0.25 mcg by mouth daily.   Yes [provider]  cholecalciferol (VITAMIN D3) 25 MCG (1000 UNIT) tablet Take 400 Units by mouth daily.    Yes [provider]  citalopram (CELEXA) 20 MG tablet Take 20 mg by mouth daily.   Yes [provider]  fenofibrate (TRICOR) 48 MG tablet Take 48 mg by mouth daily.   Yes [provider]  fluticasone (FLONASE) 50 MCG/ACT nasal spray Place 2 sprays into both nostrils daily.   Yes [provider]   furosemide (LASIX) 20 MG tablet Take 20 mg by mouth daily.   Yes [provider]  gabapentin (NEURONTIN) 100 MG capsule Take 100-300 mg by mouth See admin instructions. Take 1 capsule (100mg ) by mouth every morning and daily at noon and take 3 capsules (300mg ) by mouth every night   Yes [provider]  levothyroxine (SYNTHROID) 125 MCG tablet Take 1 tablet (125 mcg total) by mouth daily before breakfast. Patient taking differently: Take 150 mcg by mouth daily before breakfast.  05/28/19  Yes Dhungel, Nishant, MD  losartan (COZAAR) 50 MG tablet Take 50 mg by mouth daily.   Yes [provider]  magnesium oxide (MAG-OX) 400 MG tablet Take 400 mg by mouth daily.   Yes [provider]  metFORMIN (GLUCOPHAGE) 1000 MG tablet Take 0.5 tablets (500 mg total) by mouth 2 (two) times daily with a meal. 05/28/19  Yes Dhungel, Nishant, MD  metoprolol tartrate (LOPRESSOR) 25 MG tablet Take 25 mg by mouth 2 (two) times daily.   Yes [provider]  montelukast (SINGULAIR) 10 MG tablet Take 10 mg by mouth at bedtime.   Yes [provider]  nitroGLYCERIN (NITRODUR - DOSED IN MG/24 HR) 0.2 mg/hr patch Place 0.2 mg onto the skin daily. leave patch on 12-14 hours, then remove for 10-12 hours prior to applying the next patch    Yes [provider]  pantoprazole (PROTONIX) 40 MG tablet Take 40 mg by mouth daily.   Yes [provider]  primidone (MYSOLINE) 50 MG tablet Take 50 mg by mouth 2 (two) times daily.   Yes [provider]  sitaGLIPtin (JANUVIA) 50 MG tablet Take 50 mg by mouth daily.   Yes [provider]  traZODone (DESYREL) 50 MG tablet Take 50 mg by mouth at bedtime as needed for sleep.   Yes [provider]  vitamin B-12 (CYANOCOBALAMIN) 1000 MCG tablet Take 1,000 mcg by mouth daily.   Yes [provider]    Review of Systems  Constitutional: Positive for fatigue. Negative for appetite change.  HENT:  Positive for hearing loss. Negative for congestion and postnasal drip.   Eyes: Negative.   Respiratory: Positive for chest tightness and shortness of breath. Negative for cough.   Cardiovascular: Positive for chest pain (intermittent). Negative for palpitations and leg swelling.  Gastrointestinal: Negative for abdominal distention and abdominal pain.  Endocrine: Negative.   Genitourinary: Negative.   Musculoskeletal: Positive for back pain. Negative for neck pain.  Skin: Negative.   Allergic/Immunologic: Negative.   Neurological: Positive for dizziness. Negative for light-headedness.  Hematological: Negative for adenopathy. Does not bruise/bleed easily.  Psychiatric/Behavioral: Positive for dysphoric mood. Negative for sleep disturbance (sleeping on 1  pillow). The patient is nervous/anxious.     Vitals:   10/28/19 1058  BP: 133/62  Pulse: 76  Resp: 18  SpO2: 100%  Weight: 144 lb 4 oz (65.4 kg)  Height: 5\' 1"  (1.549 m)   Wt Readings from Last 3 Encounters:  10/28/19 144 lb 4 oz (65.4 kg)  10/24/19 140 lb (63.5 kg)  09/17/19 146 lb (66.2 kg)   Lab Results  Component Value Date   CREATININE 1.20 (H) 09/17/2019   CREATININE 1.50 (H) 07/20/2019   CREATININE 1.33 (H) 07/19/2019    Physical Exam Vitals and nursing note reviewed.  Constitutional:      Appearance: She is well-developed.  HENT:     Head: Normocephalic and atraumatic.     Right Ear: Decreased hearing noted.     Left Ear: Decreased hearing noted.  Cardiovascular:     Rate and Rhythm: Normal rate and regular rhythm.  Pulmonary:     Effort: Pulmonary effort is normal. No respiratory distress.     Breath sounds: No wheezing or rales.  Abdominal:     General: Abdomen is flat. There is no distension.     Palpations: Abdomen is soft.  Musculoskeletal:        General: No tenderness. Normal range of motion.     Cervical back: Normal range of motion and neck supple.     Right lower leg: No edema.     Left lower  leg: No edema.  Skin:    General: Skin is warm and dry.  Neurological:     General: No focal deficit present.     Mental Status: She is alert and oriented to person, place, and time.  Psychiatric:        Mood and Affect: Mood normal.        Behavior: Behavior normal.        Thought Content: Thought content normal.    Assessment & Plan:  1: Chronic heart failure with preserved ejection fraction with structural changes- - NYHA class II - euvolemic today - order written (again) for facility to start weighing patient daily and call for an overnight weight gain of >2 pounds or a weekly weight gain of >5 pounds - weight down 2 pounds from last visit here 3 months ago - not adding salt and is currently on a heart healthy diet at the facility - saw cardiology 07/21/2019) 10/02/19 - BNP 07/15/19 was 593.0  - reports receiving both COVID vaccines  2: HTN- - BP looks good today - saw PCP ([facility]) 09/11/19 - BMP 09/17/19 reviewed and showed sodium 135, potassium 4.1, creatinine 1.2 and GFR 45  3: DM- - saw endocrinology 09/19/19) 06/04/19 - A1c 05/27/19 was 7.1%  4: Tobacco use- - smoking 1 ppd of cigarettes - continued cessation discussed for 3 minutes with patient and her friend   Facility medication list was reviewed.   Return in 6 months or sooner for any questions/problems before then.

## 2019-10-28 NOTE — Patient Instructions (Signed)
Continue weighing daily and call for an overnight weight gain of > 2 pounds or a weekly weight gain of >5 pounds. 

## 2019-10-29 ENCOUNTER — Encounter: Payer: Self-pay | Admitting: Family

## 2019-12-21 IMAGING — CR DG CHEST 2V
2 series · 2 of 2 positions shown · non-contrast
Comparison: Chest radiograph 11/14/2018

CLINICAL DATA: Nonsmoker.  Cough.

EXAM:
CHEST - 2 VIEW

[chest pa]
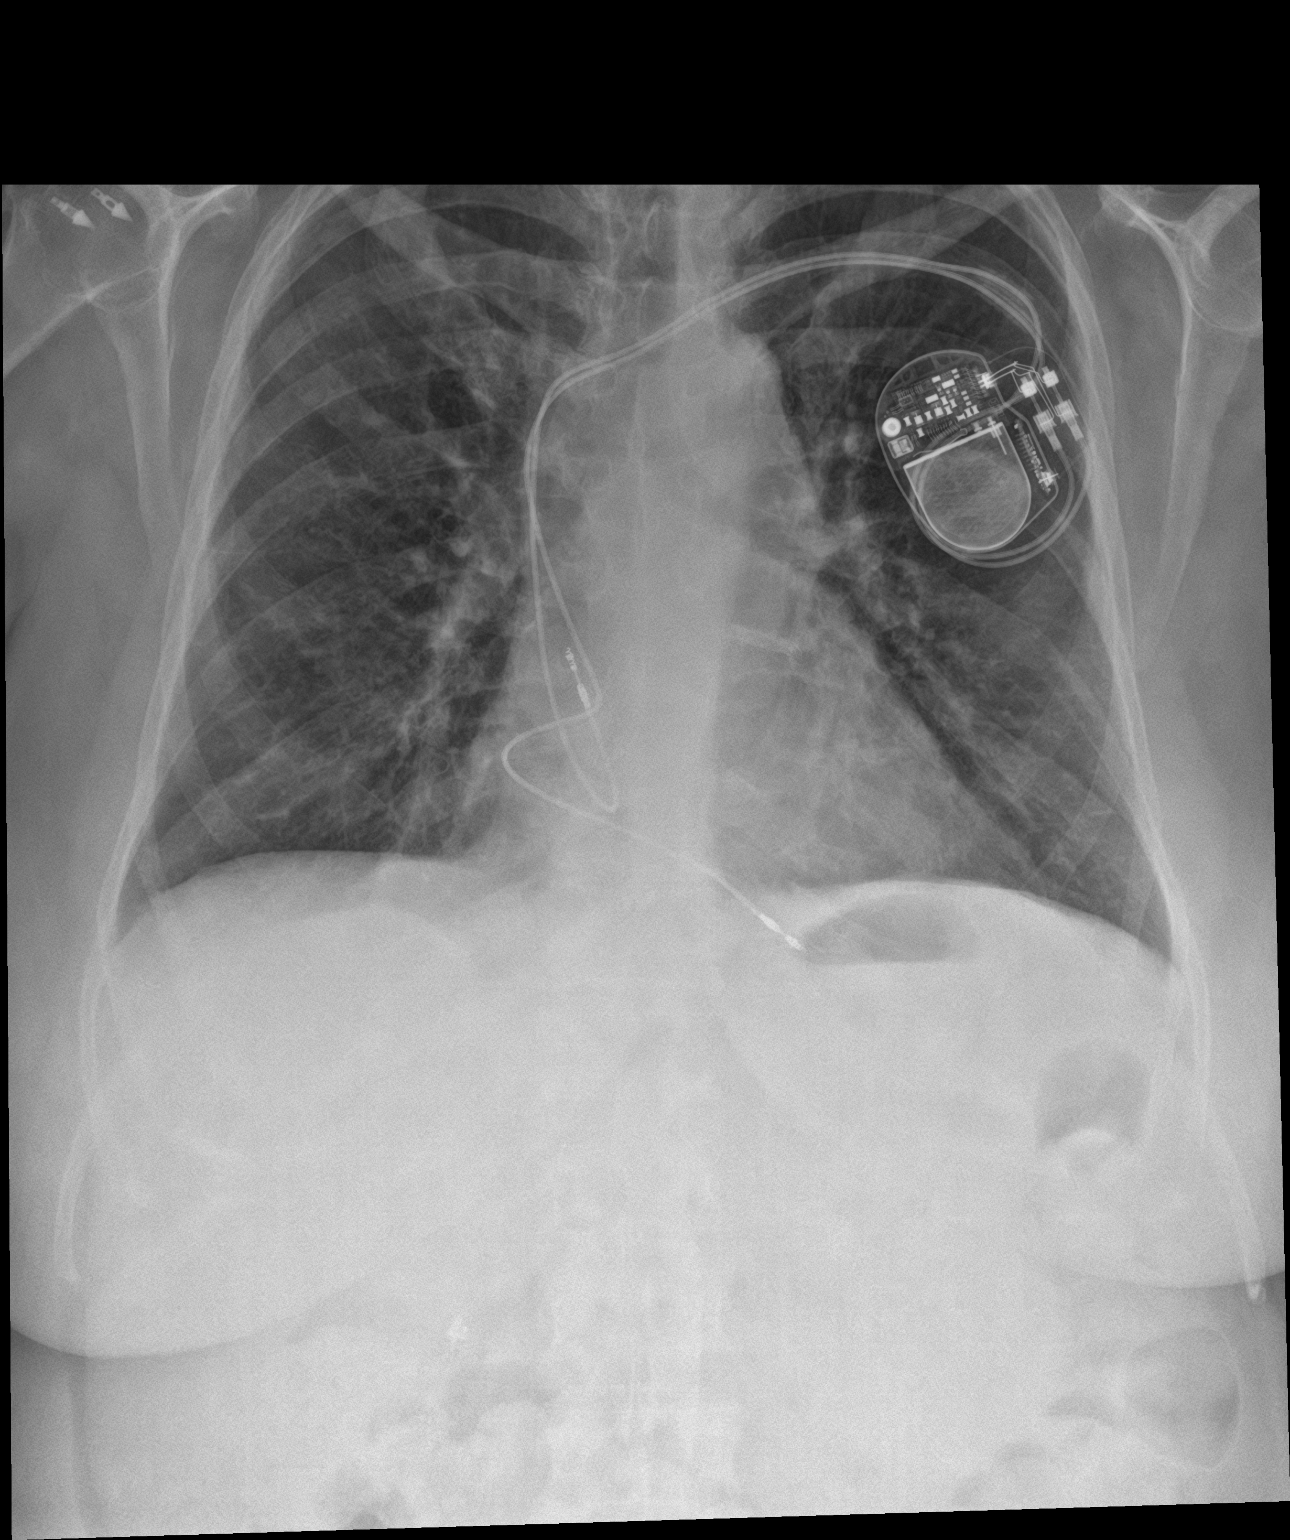

[chest lat]
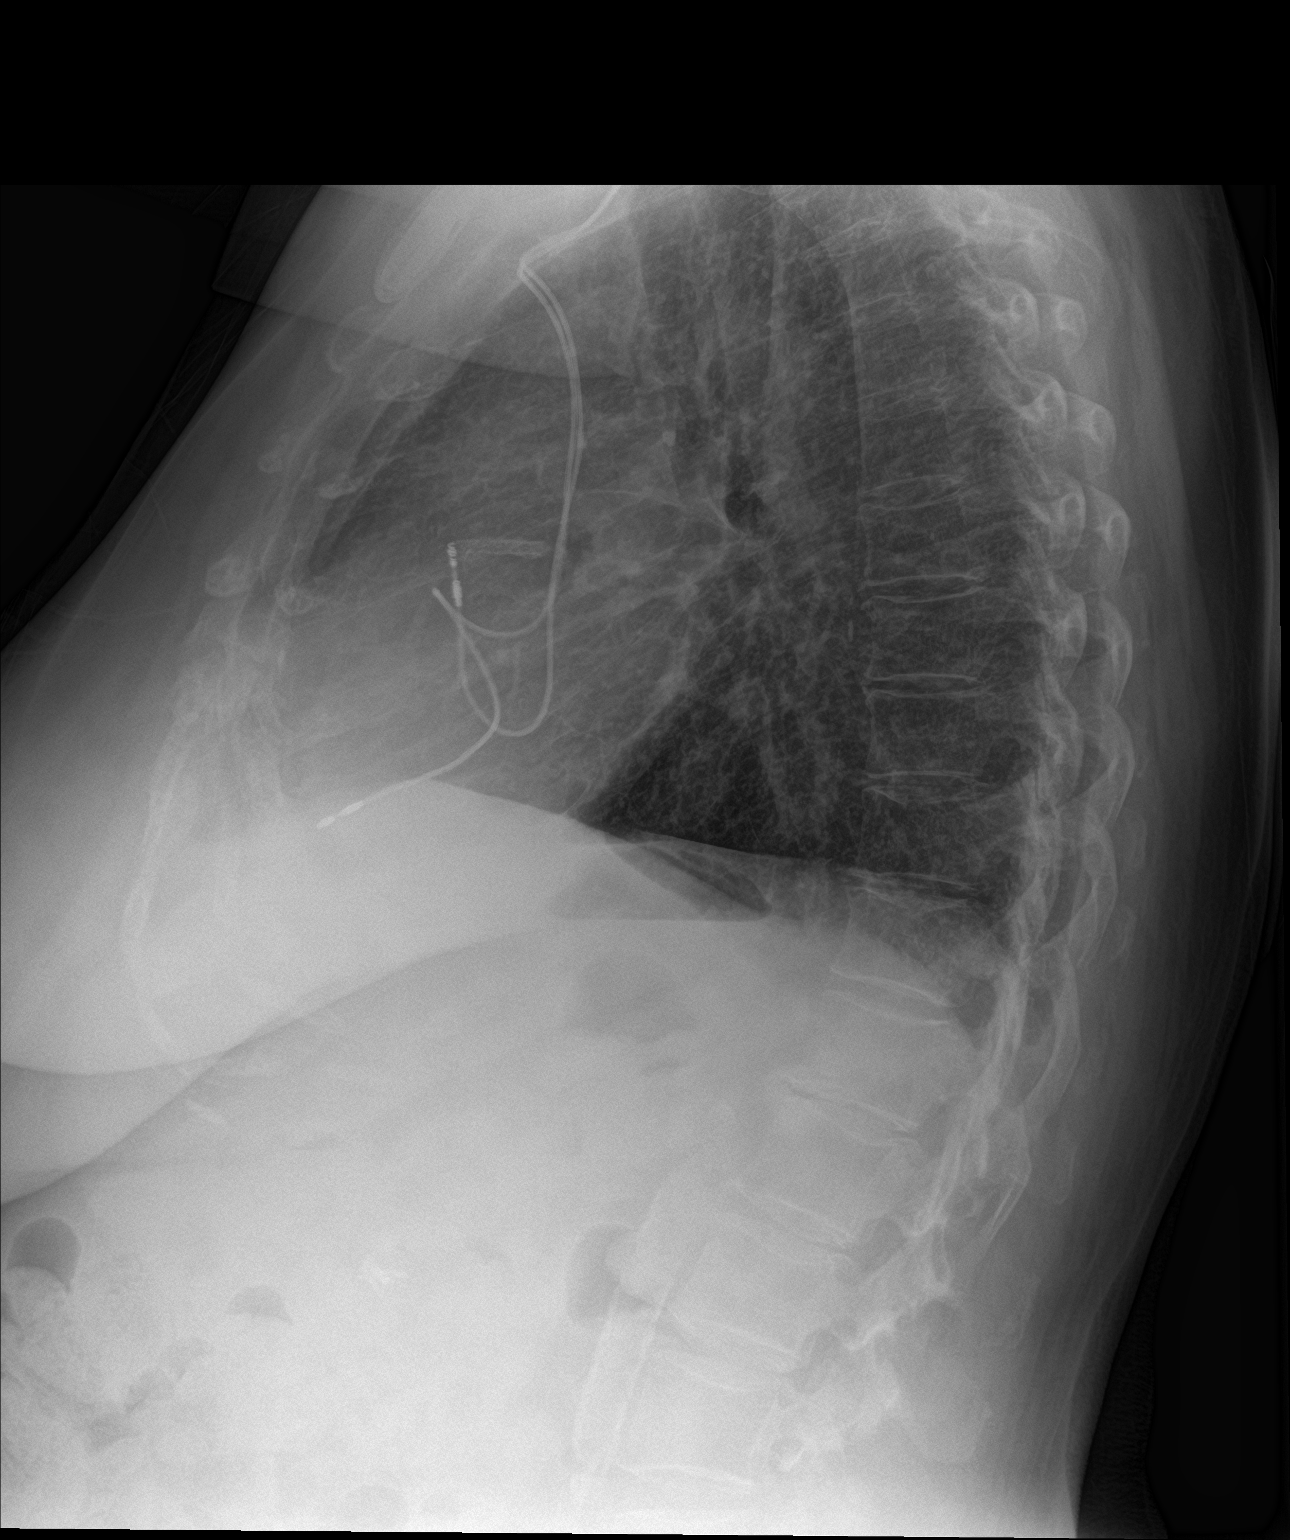

[2 of 2 positions shown; findings below may reference images not displayed]

FINDINGS: Multi lead pacer apparatus overlies the left hemithorax. Stable
cardiac and mediastinal contours. No consolidative pulmonary
opacities. No pleural effusion or pneumothorax. Thoracic spine
degenerative changes. Cholecystectomy clips.
IMPRESSION: No acute cardiopulmonary process.

## 2019-12-30 ENCOUNTER — Other Ambulatory Visit: Payer: Self-pay

## 2019-12-30 ENCOUNTER — Emergency Department
Admission: EM | Admit: 2019-12-30 | Discharge: 2019-12-30 | Disposition: A | Discharge status: Home / Self Care | Payer: Medicare Other | Attending: Emergency Medicine | Admitting: Emergency Medicine

## 2019-12-30 ENCOUNTER — Emergency Department: Payer: Medicare Other

## 2019-12-30 DIAGNOSIS — F17210 Nicotine dependence, cigarettes, uncomplicated: Secondary | ICD-10-CM | POA: Insufficient documentation

## 2019-12-30 DIAGNOSIS — Z7982 Long term (current) use of aspirin: Secondary | ICD-10-CM | POA: Insufficient documentation

## 2019-12-30 DIAGNOSIS — W19XXXA Unspecified fall, initial encounter: Secondary | ICD-10-CM | POA: Insufficient documentation

## 2019-12-30 DIAGNOSIS — R519 Headache, unspecified: Secondary | ICD-10-CM | POA: Insufficient documentation

## 2019-12-30 DIAGNOSIS — S52031A Displaced fracture of olecranon process with intraarticular extension of right ulna, initial encounter for closed fracture: Secondary | ICD-10-CM | POA: Insufficient documentation

## 2019-12-30 DIAGNOSIS — E119 Type 2 diabetes mellitus without complications: Secondary | ICD-10-CM | POA: Diagnosis not present

## 2019-12-30 DIAGNOSIS — J449 Chronic obstructive pulmonary disease, unspecified: Secondary | ICD-10-CM | POA: Insufficient documentation

## 2019-12-30 DIAGNOSIS — I2510 Atherosclerotic heart disease of native coronary artery without angina pectoris: Secondary | ICD-10-CM | POA: Diagnosis not present

## 2019-12-30 DIAGNOSIS — S59901A Unspecified injury of right elbow, initial encounter: Secondary | ICD-10-CM | POA: Diagnosis present

## 2019-12-30 DIAGNOSIS — Z79899 Other long term (current) drug therapy: Secondary | ICD-10-CM | POA: Diagnosis not present

## 2019-12-30 DIAGNOSIS — I110 Hypertensive heart disease with heart failure: Secondary | ICD-10-CM | POA: Insufficient documentation

## 2019-12-30 DIAGNOSIS — E039 Hypothyroidism, unspecified: Secondary | ICD-10-CM | POA: Insufficient documentation

## 2019-12-30 DIAGNOSIS — I5023 Acute on chronic systolic (congestive) heart failure: Secondary | ICD-10-CM | POA: Diagnosis not present

## 2019-12-30 DIAGNOSIS — S52021A Displaced fracture of olecranon process without intraarticular extension of right ulna, initial encounter for closed fracture: Secondary | ICD-10-CM

## 2019-12-30 MED ORDER — HYDROCODONE-ACETAMINOPHEN 5-325 MG PO TABS
1.0000 | ORAL_TABLET | Freq: Four times a day (QID) | ORAL | 0 refills | Status: AC | PRN
Start: 2019-12-30 — End: 2020-01-02

## 2019-12-30 MED ORDER — ONDANSETRON 4 MG PO TBDP: 4.0000 mg | ORAL_TABLET | Freq: Three times a day (TID) | ORAL | 0 refills | Status: AC | PRN

## 2019-12-30 NOTE — ED Notes (Signed)
See this RN's triage note. Pt with abrasions to right elbow and bilateral knees. Pt stating pain 8/10. Pt given warm blanket.

## 2019-12-30 NOTE — ED Provider Notes (Signed)
Emergency Department Provider Note  ____________________________________________  Time seen: Approximately 3:43 PM  I have reviewed the triage vital signs and the nursing notes.   HISTORY  Chief Complaint Fall   Historian Patient     HPI Melanie King is a 74 y.o. female with a history of hypertension, CHF and diabetes, presents to the emergency department after patient had a witnessed, mechanical fall. Patient did her head but no loss of consciousness occurred. She is complaining of headache, right elbow pain and right ankle pain. Patient has not attempted to bear weight since fall occurred. Patient has abrasions to right elbow and bilateral knees. She denies chest pain, chest tightness or abdominal pain. No other alleviating measures have been attempted.   Past Medical History:  Diagnosis Date  . Arthritis   . CHF (congestive heart failure) (HCC)   . COPD (chronic obstructive pulmonary disease) (HCC)   . Coronary arteriosclerosis   . DDD (degenerative disc disease), cervical   . DDD (degenerative disc disease), lumbar   . Depression   . Diabetes mellitus without complication (HCC)   . GERD (gastroesophageal reflux disease)   . Hyperlipidemia   . Hypertension   . Osteoarthritis   . Pacemaker   . Tremor      Immunizations up to date:  Yes.     Past Medical History:  Diagnosis Date  . Arthritis   . CHF (congestive heart failure) (HCC)   . COPD (chronic obstructive pulmonary disease) (HCC)   . Coronary arteriosclerosis   . DDD (degenerative disc disease), cervical   . DDD (degenerative disc disease), lumbar   . Depression   . Diabetes mellitus without complication (HCC)   . GERD (gastroesophageal reflux disease)   . Hyperlipidemia   . Hypertension   . Osteoarthritis   . Pacemaker   . Tremor     Patient Active Problem List   Diagnosis Date Noted  . Dysphagia 07/19/2019  . Acute respiratory failure due to COVID-19 (HCC) 07/16/2019  . Respiratory  distress   . Acute on chronic congestive heart failure (HCC)   . Acute respiratory failure (HCC) 07/15/2019  . Frequent falls 05/26/2019  . Generalized weakness 05/26/2019  . AKI (acute kidney injury) (HCC) 03/21/2019  . Subdural hematoma (HCC) 03/20/2019  . Hypothyroidism 03/10/2019  . Depression   . Chronic diastolic CHF (congestive heart failure) (HCC)   . Acute metabolic encephalopathy   . Fall   . Tobacco abuse   . C6 cervical fracture (HCC)   . Acute delirium 05/12/2017  . Seizure (HCC) 05/10/2017  . Elevated sedimentation rate 04/04/2017  . Elevated C-reactive protein (CRP) 04/04/2017  . Chest pain, unspecified 04/02/2017  . Bilateral lower extremity pain (Secondary Area of Pain) (R>L) 04/02/2017  . Chronic hip pain, bilateral  (Secondary Area of Pain) (R>L) 04/02/2017  . Chronic bilateral low back pain with bilateral sciatica [facility] Area of Pain) (R>L) 04/02/2017  . Chronic pain syndrome 04/02/2017  . Disorder of bone, unspecified 04/02/2017  . Other long term (current) drug therapy 04/02/2017  . Other specified health status 04/02/2017  . Long term current use of opiate analgesic 04/02/2017  . Hip pain, bilateral 12/06/2016  . Tremor 05/27/2016  . Trigger finger of left hand 02/16/2016  . Osteoarthritis of both hands 02/16/2016  . Angina pectoris (HCC) 01/17/2016  . COPD (chronic obstructive pulmonary disease) (HCC) 01/17/2016  . CAD (coronary artery disease) 01/17/2016  . HLD (hyperlipidemia) 01/17/2016  . Hypertension 01/17/2016  . Sick sinus syndrome (HCC) 01/17/2016  .  Chronic pain 01/17/2016  . Personal history of tobacco use, presenting hazards to health 11/16/2015  . Type 2 diabetes mellitus without complication, with long-term current use of insulin (HCC) 05/12/2013    Past Surgical History:  Procedure Laterality Date  . ABDOMINAL HYSTERECTOMY    . BREAST BIOPSY Right 1994   neg cyst removed  . CHOLECYSTECTOMY    . EYE SURGERY  1964, 1966, and 1967    bilateral  . FOOT SURGERY Right    cellulitis  . PACEMAKER INSERTION    . SPINE SURGERY     cyst removed; [facility]    Prior to Admission medications   Medication Sig Start Date End Date Taking? Authorizing Provider  amLODipine (NORVASC) 10 MG tablet TAKE 1 TABLET BY MOUTH EVERY DAY 08/28/16   Gabriel Cirri, NP  aspirin EC 81 MG tablet Take 81 mg by mouth daily.    [provider]  benztropine (COGENTIN) 0.5 MG tablet Take 0.5 mg by mouth 2 (two) times daily.    [provider]  brexpiprazole (REXULTI) 1 MG TABS tablet Take 1 mg by mouth at bedtime.    [provider]  calcitRIOL (ROCALTROL) 0.25 MCG capsule Take 0.25 mcg by mouth daily.    [provider]  cholecalciferol (VITAMIN D3) 25 MCG (1000 UNIT) tablet Take 400 Units by mouth daily.     [provider]  citalopram (CELEXA) 20 MG tablet Take 20 mg by mouth daily.    [provider]  fenofibrate (TRICOR) 48 MG tablet Take 48 mg by mouth daily.    [provider]  fluticasone (FLONASE) 50 MCG/ACT nasal spray Place 2 sprays into both nostrils daily.    [provider]  furosemide (LASIX) 20 MG tablet Take 20 mg by mouth daily.    [provider]  gabapentin (NEURONTIN) 100 MG capsule Take 100-300 mg by mouth See admin instructions. Take 1 capsule (100mg ) by mouth every morning and daily at noon and take 3 capsules (300mg ) by mouth every night    [provider]  HYDROcodone-acetaminophen (NORCO) 5-325 MG tablet Take 1 tablet by mouth every 6 (six) hours as needed for up to 3 days for moderate pain. 12/30/19 01/02/20  01/01/20, PA-C  levothyroxine (SYNTHROID) 125 MCG tablet Take 1 tablet (125 mcg total) by mouth daily before breakfast. Patient taking differently: Take 150 mcg by mouth daily before breakfast.  05/28/19   Dhungel, Nishant, MD  losartan (COZAAR) 50 MG tablet Take 50 mg by mouth daily.    [provider]  magnesium  oxide (MAG-OX) 400 MG tablet Take 400 mg by mouth daily.    [provider]  metFORMIN (GLUCOPHAGE) 1000 MG tablet Take 0.5 tablets (500 mg total) by mouth 2 (two) times daily with a meal. 05/28/19   Dhungel, Nishant, MD  metoprolol tartrate (LOPRESSOR) 25 MG tablet Take 25 mg by mouth 2 (two) times daily.    [provider]  montelukast (SINGULAIR) 10 MG tablet Take 10 mg by mouth at bedtime.    [provider]  nitroGLYCERIN (NITRODUR - DOSED IN MG/24 HR) 0.2 mg/hr patch Place 0.2 mg onto the skin daily. leave patch on 12-14 hours, then remove for 10-12 hours prior to applying the next patch     [provider]  ondansetron (ZOFRAN ODT) 4 MG disintegrating tablet Take 1 tablet (4 mg total) by mouth every 8 (eight) hours as needed for up to 5 days. 12/30/19 01/04/20  01/01/20  M, PA-C  pantoprazole (PROTONIX) 40 MG tablet Take 40 mg by mouth daily.    [provider]  primidone (MYSOLINE) 50 MG tablet Take 50 mg by mouth 2 (two) times daily.    [provider]  sitaGLIPtin (JANUVIA) 50 MG tablet Take 50 mg by mouth daily.    [provider]  traZODone (DESYREL) 50 MG tablet Take 50 mg by mouth at bedtime as needed for sleep.    [provider]  vitamin B-12 (CYANOCOBALAMIN) 1000 MCG tablet Take 1,000 mcg by mouth daily.    [provider]    Allergies Alprazolam, Fentanyl, Meperidine, and Ranitidine hcl  Family History  Problem Relation Age of Onset  . Breast cancer Maternal Aunt 40  . Cancer Mother        colon  . Aneurysm Father     Social History Social History   Tobacco Use  . Smoking status: Current Every Day Smoker    Packs/day: 1.00    Years: 50.00    Pack years: 50.00    Types: Cigarettes  . Smokeless tobacco: Never Used  Substance Use Topics  . Alcohol use: No  . Drug use: No     Review of Systems  Constitutional: No fever/chills Eyes:  No discharge ENT: No upper respiratory  complaints. Respiratory: no cough. No SOB/ use of accessory muscles to breath Gastrointestinal:   No nausea, no vomiting.  No diarrhea.  No constipation. Musculoskeletal: See HPI.  Skin: Patient has abrasions.     ____________________________________________   PHYSICAL EXAM:  VITAL SIGNS: ED Triage Vitals  Enc Vitals Group     BP 12/30/19 1527 (!) 136/56     Pulse Rate 12/30/19 1527 63     Resp 12/30/19 1527 18     Temp 12/30/19 1527 97.7 F (36.5 C)     Temp Source 12/30/19 1527 Oral     SpO2 12/30/19 1527 99 %     Weight 12/30/19 1528 144 lb 2.9 oz (65.4 kg)     Height 12/30/19 1528 5\' 1"  (1.549 m)     Head Circumference --      Peak Flow --      Pain Score 12/30/19 1527 8     Pain Loc --      Pain Edu? --      Excl. in GC? --      Constitutional: Alert and oriented. Well appearing and in no acute distress. Eyes: Conjunctivae are normal. PERRL. EOMI. Head: Atraumatic. ENT:      Ears: TMs are pearly.       Nose: No congestion/rhinnorhea.      Mouth/Throat: Mucous membranes are moist.  Neck: No stridor. FROM.  Cardiovascular: Normal rate, regular rhythm. Normal S1 and S2.  Good peripheral circulation. Respiratory: Normal respiratory effort without tachypnea or retractions. Lungs CTAB. Good air entry to the bases with no decreased or absent breath sounds Gastrointestinal: Bowel sounds x 4 quadrants. Soft and nontender to palpation. No guarding or rigidity. No distention. Musculoskeletal: Patient has symmetric strength in the upper and lower extremities.  She is unable to perform full range of motion at the right elbow.  Palpable radial, ulnar and dorsalis pedis pulses bilaterally and symmetrically. Neurologic:  Normal for age. No gross focal neurologic deficits are appreciated.  Skin: Patient has abrasion to right elbow and bilateral knees. Psychiatric: Mood and affect are normal for age. Speech and behavior are normal.   ____________________________________________    LABS (all labs ordered are listed,  but only abnormal results are displayed)  Labs Reviewed - No data to display ____________________________________________  EKG   ____________________________________________  RADIOLOGY Geraldo Pitter, personally viewed and evaluated these images (plain radiographs) as part of my medical decision making, as well as reviewing the written report by the radiologist.    DG Chest 1 View  Result Date: 12/30/2019 CLINICAL DATA:  Fall EXAM: CHEST  1 VIEW COMPARISON:  09/17/2019 FINDINGS: Left-sided pacing device as before. Postsurgical changes of the right humeral head and probably the [facility] joint. Diminished lung volumes. Enlarged cardiomediastinal silhouette with mild vascular congestion and mild diffuse interstitial opacity, probable mild edema. Negative for pneumothorax. Aortic atherosclerosis. IMPRESSION: Cardiomegaly with vascular congestion and mild diffuse interstitial opacity, probable mild edema. Electronically Signed   By: Jasmine Pang M.D.   On: 12/30/2019 16:09   DG Elbow 2 Views Right  Result Date: 12/30/2019 CLINICAL DATA:  Right elbow abrasion after fall EXAM: LEFT ELBOW - 2 VIEW COMPARISON:  None. FINDINGS: Acute comminuted fracture of the olecranon with up to 1.6 cm of distraction of the dominant fracture fragment. No ulnotrochlear dislocation. Radiocapitellar alignment is maintained. No evidence of radial head fracture. Elbow joint hemarthrosis is suspected although not well evaluated secondary to obliquity on the lateral view. There is prominent soft tissue swelling at the posterior elbow. IMPRESSION: 1. Acute comminuted fracture of the olecranon process of the proximal right ulna with up to 1.6 cm of distraction of the dominant fracture fragment. No dislocation. 2. Posterior elbow soft tissue swelling. Electronically Signed   By: Duanne Guess D.O.   On: 12/30/2019 16:09   DG Ankle 2 Views Right  Result Date: 12/30/2019 CLINICAL DATA:   Fall EXAM: RIGHT ANKLE - 2 VIEW COMPARISON:  None. FINDINGS: Screw fixation of medial malleolus with chronic fracture deformity. Surgical plate and fixating screws in the distal fibula. 2 fixating screws within the distal fibula, not associated with the surgical plate. No acute displaced fracture is seen. Degenerative changes medially and laterally. IMPRESSION: 1. No acute osseous abnormality. 2. Postsurgical changes of the distal fibula and medial malleolus. Electronically Signed   By: Jasmine Pang M.D.   On: 12/30/2019 16:07   CT Head Wo Contrast  Result Date: 12/30/2019 CLINICAL DATA:  Head trauma, minor. Neck trauma. Additional history provided: Witnessed fall, abrasion to right elbow and bilateral knees. EXAM: CT HEAD WITHOUT CONTRAST CT CERVICAL SPINE WITHOUT CONTRAST TECHNIQUE: Multidetector CT imaging of the head and cervical spine was performed following the standard protocol without intravenous contrast. Multiplanar CT image reconstructions of the cervical spine were also generated. COMPARISON:  CT head/cervical spine 10/24/2019. FINDINGS: CT HEAD FINDINGS Brain: Mild-to-moderate cerebral atrophy. Redemonstrated chronic lacunar infarcts versus prominent perivascular spaces within the bilateral basal ganglia. There is no acute intracranial hemorrhage. No demarcated cortical infarct. No extra-axial fluid collection. No evidence of intracranial mass. No midline shift. Vascular: No hyperdense vessel.  Atherosclerotic calcifications. Skull: Normal. Negative for fracture or focal lesion. Sinuses/Orbits: Visualized orbits show no acute finding. Mild ethmoid and left maxillary sinus mucosal thickening at the imaged levels. No significant mastoid effusion. CT CERVICAL SPINE FINDINGS Alignment: Cervical dextrocurvature. Reversal of the expected cervical lordosis. Mild C5-C6 grade 1 anterolisthesis. Skull base and vertebrae: The basion-dental and atlanto-dental intervals are maintained.No evidence of acute  fracture to the cervical spine. Congenital nonunion of the posterior arch of C1. Soft tissues and spinal canal: No prevertebral fluid or swelling. No visible canal hematoma. Disc levels: Cervical spondylosis with multilevel disc space narrowing,  disc bulges, posterior disc osteophytes, uncovertebral hypertrophy and facet arthrosis. There is fusion across the C4-C5 disc space and facet joints. Multilevel spinal canal stenosis. Most notably, a posterior disc osteophyte complex at C3-C4 contributes to likely moderate/severe spinal canal stenosis. Upper chest: No consolidation within the imaged lung apices. No visible pneumothorax. IMPRESSION: CT head: 1. No evidence of acute intracranial abnormality. 2. Mild-to-moderate cerebral atrophy. 3. Redemonstrated chronic lacunar infarcts versus prominent perivascular spaces within the bilateral basal ganglia. CT cervical spine: 1. No evidence of acute fracture to the cervical spine. 2. Mild C5-C6 grade 1 anterolisthesis. 3. Nonspecific reversal of the expected cervical lordosis. Cervical dextrocurvature. 4. Cervical spondylosis with multilevel spinal canal stenosis as described. Most notably, a posterior disc osteophyte complex at C3-C4 contributes to likely moderate/severe spinal canal stenosis. Electronically Signed   By: Jackey Loge DO   On: 12/30/2019 16:06   CT Cervical Spine Wo Contrast  Result Date: 12/30/2019 CLINICAL DATA:  Head trauma, minor. Neck trauma. Additional history provided: Witnessed fall, abrasion to right elbow and bilateral knees. EXAM: CT HEAD WITHOUT CONTRAST CT CERVICAL SPINE WITHOUT CONTRAST TECHNIQUE: Multidetector CT imaging of the head and cervical spine was performed following the standard protocol without intravenous contrast. Multiplanar CT image reconstructions of the cervical spine were also generated. COMPARISON:  CT head/cervical spine 10/24/2019. FINDINGS: CT HEAD FINDINGS Brain: Mild-to-moderate cerebral atrophy. Redemonstrated  chronic lacunar infarcts versus prominent perivascular spaces within the bilateral basal ganglia. There is no acute intracranial hemorrhage. No demarcated cortical infarct. No extra-axial fluid collection. No evidence of intracranial mass. No midline shift. Vascular: No hyperdense vessel.  Atherosclerotic calcifications. Skull: Normal. Negative for fracture or focal lesion. Sinuses/Orbits: Visualized orbits show no acute finding. Mild ethmoid and left maxillary sinus mucosal thickening at the imaged levels. No significant mastoid effusion. CT CERVICAL SPINE FINDINGS Alignment: Cervical dextrocurvature. Reversal of the expected cervical lordosis. Mild C5-C6 grade 1 anterolisthesis. Skull base and vertebrae: The basion-dental and atlanto-dental intervals are maintained.No evidence of acute fracture to the cervical spine. Congenital nonunion of the posterior arch of C1. Soft tissues and spinal canal: No prevertebral fluid or swelling. No visible canal hematoma. Disc levels: Cervical spondylosis with multilevel disc space narrowing, disc bulges, posterior disc osteophytes, uncovertebral hypertrophy and facet arthrosis. There is fusion across the C4-C5 disc space and facet joints. Multilevel spinal canal stenosis. Most notably, a posterior disc osteophyte complex at C3-C4 contributes to likely moderate/severe spinal canal stenosis. Upper chest: No consolidation within the imaged lung apices. No visible pneumothorax. IMPRESSION: CT head: 1. No evidence of acute intracranial abnormality. 2. Mild-to-moderate cerebral atrophy. 3. Redemonstrated chronic lacunar infarcts versus prominent perivascular spaces within the bilateral basal ganglia. CT cervical spine: 1. No evidence of acute fracture to the cervical spine. 2. Mild C5-C6 grade 1 anterolisthesis. 3. Nonspecific reversal of the expected cervical lordosis. Cervical dextrocurvature. 4. Cervical spondylosis with multilevel spinal canal stenosis as described. Most notably,  a posterior disc osteophyte complex at C3-C4 contributes to likely moderate/severe spinal canal stenosis. Electronically Signed   By: Jackey Loge DO   On: 12/30/2019 16:06   CT ELBOW RIGHT WO CONTRAST  Result Date: 12/30/2019 CLINICAL DATA:  Mechanical fall, right elbow injury EXAM: CT OF THE LOWER RIGHT EXTREMITY WITHOUT CONTRAST TECHNIQUE: Multidetector CT imaging of the right lower extremity was performed according to the standard protocol. COMPARISON:  Radiograph 12/30/2019 FINDINGS: Bones/Joint/Cartilage Comminuted displaced intra-articular fracture of the olecranon extending into the trochlea without significant retraction of the olecranon process. Comminuted fracture involving the  coronoid process extending into the trochlea as well. There is a minimally displaced fracture of the volar aspect of the radial head. Minimally impacted fracture of the radial head neck junction. Distal humerus is intact. Elbow alignment otherwise maintained. Background of mild degenerative changes at the elbow. Small elbow joint effusion is present. Ligaments Suboptimally assessed by CT. Muscles and Tendons No large intramuscular hematoma. No frank musculotendinous discontinuity nor partially retracted/torn tendons are seen. Soft tissues Focal soft tissue thickening is seen superficial to the olecranon, likely with some distension of the olecranon bursa. Additional milder soft tissue swelling seen both medially and laterally about the elbow. IMPRESSION: 1. Comminuted displaced intra-articular fracture of the olecranon extending into the trochlea without significant retraction of the olecranon process. 2. Comminuted fracture involving the coronoid process extending into the trochlea as well. 3. Minimally displaced fracture of the volar aspect of the radial head. 4. Minimally impacted fracture of the radial head neck junction. 5. Small elbow joint effusion. 6. Focal soft tissue thickening superficial to the olecranon, likely  with some distension of the olecranon bursa. Additional milder soft tissue swelling seen both medially and laterally about the elbow. Electronically Signed   By: Kreg Shropshire M.D.   On: 12/30/2019 17:35    ____________________________________________    PROCEDURES  Procedure(s) performed:     Procedures     Medications - No data to display   ____________________________________________   INITIAL IMPRESSION / ASSESSMENT AND PLAN / ED COURSE  Pertinent labs & imaging results that were available during my care of the patient were reviewed by me and considered in my medical decision making (see chart for details).      Assessment and Plan:  Fall 74 year old female presents to the emergency department after a mechanical fall complaining primarily of headache, right elbow pain and right ankle pain.  Vital signs were reassuring at triage.  On physical exam, patient was unable to perform full range of motion at the right elbow but was able to perform full range of motion at the right ankle.  X-ray of the right elbow indicated a comminuted right olecranon fracture.  Orthopedist on-call, Dr. Martha Clan was consulted regarding patient's case.  He recommended a posterior long-arm splint in a position of comfort and ordered a CT of the right elbow for possible surgical planning.  Patient was placed in a sling.  She was discharged with Norco for pain.  Return precautions were given to return with new or worsening symptoms.      ____________________________________________  FINAL CLINICAL IMPRESSION(S) / ED DIAGNOSES  Final diagnoses:  Fall, initial encounter  Closed fracture of olecranon process of right ulna, initial encounter      NEW MEDICATIONS STARTED DURING THIS VISIT:  ED Discharge Orders         Ordered    HYDROcodone-acetaminophen (NORCO) 5-325 MG tablet  Every 6 hours PRN        12/30/19 1731    ondansetron (ZOFRAN ODT) 4 MG disintegrating tablet  Every 8 hours  PRN        12/30/19 1731              This chart was dictated using voice recognition software/Dragon. Despite best efforts to proofread, errors can occur which can change the meaning. Any change was purely unintentional.     Orvil Feil, PA-C 12/30/19 Maureen Chatters    Shaune Pollack, MD 12/30/19 Izell Huntley

## 2019-12-30 NOTE — Discharge Instructions (Signed)
Please make follow-up appointment with orthopedics, Dr. Martha Clan. You have been prescribed Norco for pain.

## 2019-12-30 NOTE — Consult Note (Addendum)
Spoke with Pia Mau, PA in ER regarding this 74 y/o RHD female with a mechanical fall.  Injured right elbow during fall.  Skin reported intact with superficial abrasions and NVI.  Xrays show comminuted, displaced fracture of the right olecranon.   CT ordered for review when patient returns to the office.   Patient will be splinted and placed in sling.  I instructed patient to have significant padding around the elbow to avoid skin breakdown from the splint.   Follow up in one week in the office.

## 2019-12-30 NOTE — ED Triage Notes (Signed)
Pt to ED via [facility] from [facility] independent [facility]. Per EMS pt had a witnessed fall by friends. Pt and friends deny LOC. Pt presents with abrasion to right elbow and bilateral abrasions to her knees. Pt stating pain 8/10. No obvious deformities noted. VSS.

## 2020-02-16 IMAGING — CT CT HEAD W/O CM
3 series · 14 of 47 positions shown, 16 images · non-contrast
Comparison: CT head without contrast 05/10/2017

CLINICAL DATA: HEAD TRAUMA. MODERATE TO SEVERE. ALTERED MENTAL
STATUS.

EXAM:
CT HEAD WITHOUT CONTRAST
CT CERVICAL SPINE WITHOUT CONTRAST
TECHNIQUE: Multidetector CT imaging of the head and cervical spine was
performed following the standard protocol without intravenous
contrast. Multiplanar CT image reconstructions of the cervical spine
were also generated.

[Series 2: head wo · axial · 0.41mm/px · z∈[-152,-12]mm · 8 of 34 slices shown, 10 images]
[im 3/34  brain]
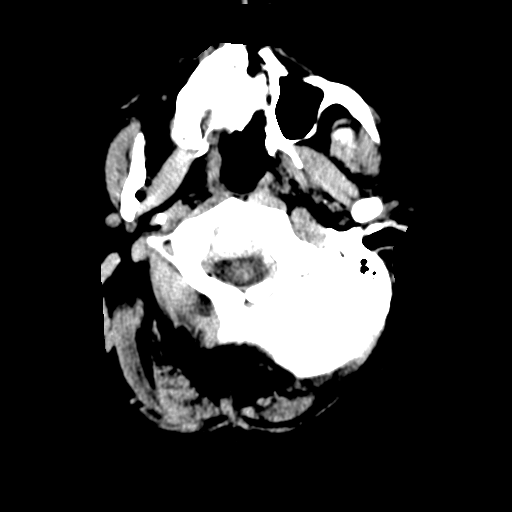
[im 3/34  bone]
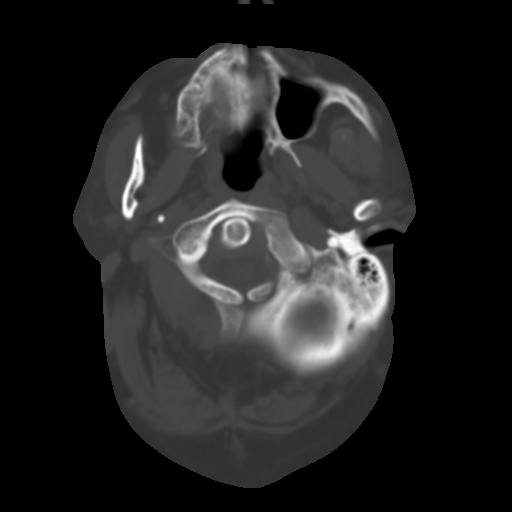
[im 7/34  brain]
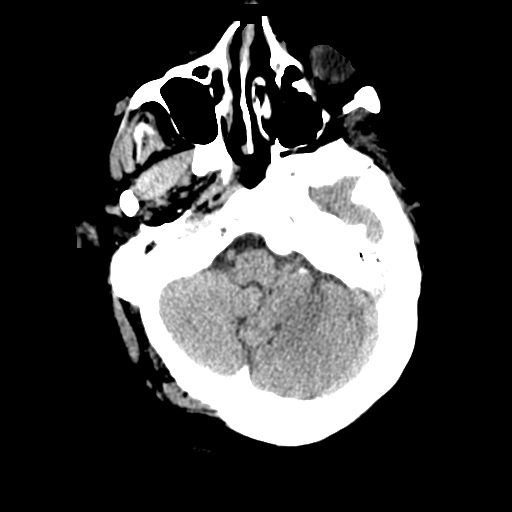
[im 11/34  brain]
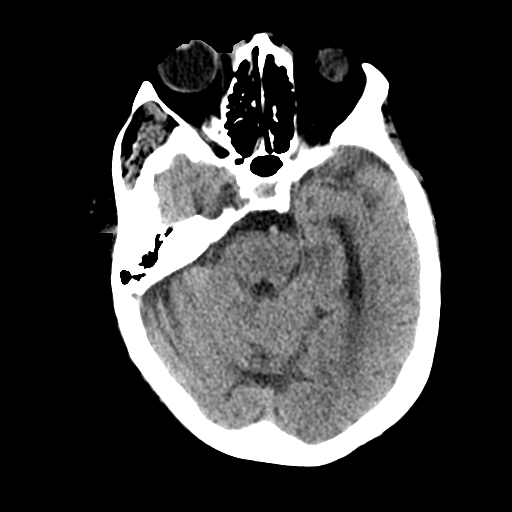
[im 15/34  brain]
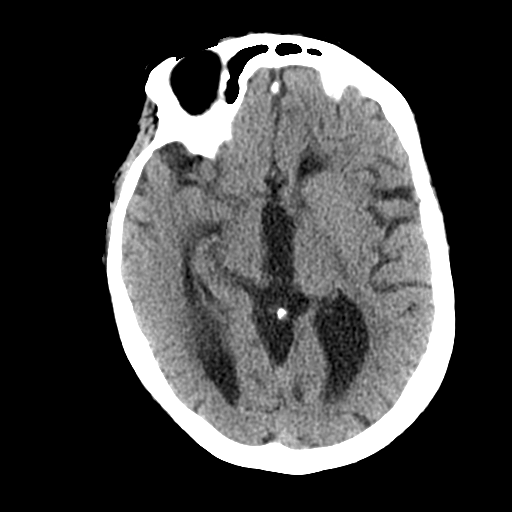
[im 19/34  brain]
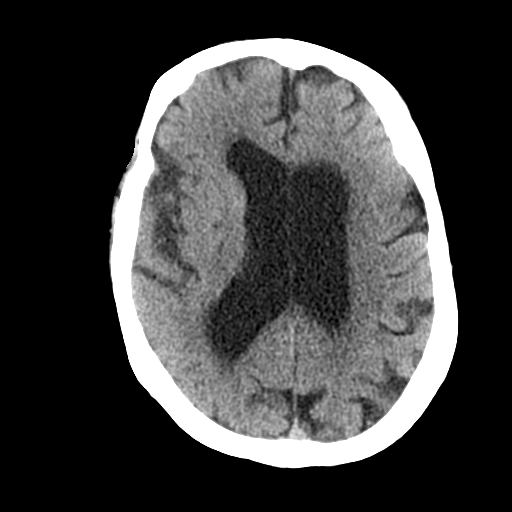
[im 19/34  bone]
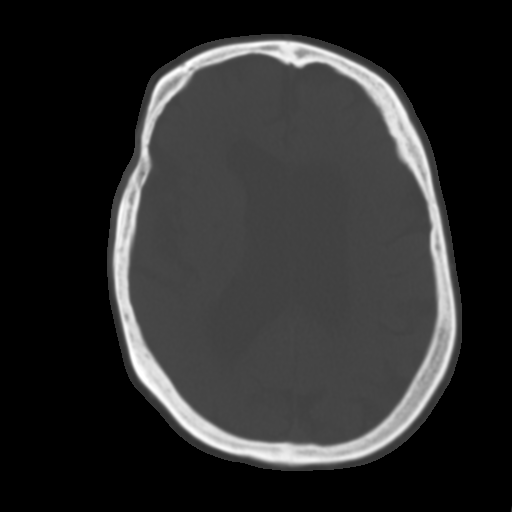
[im 23/34  brain]
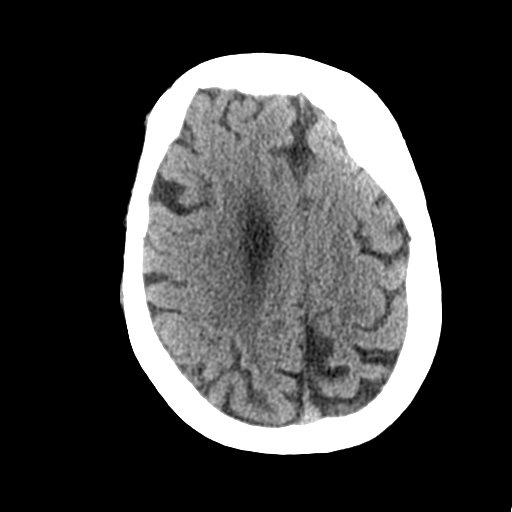
[im 27/34  brain]
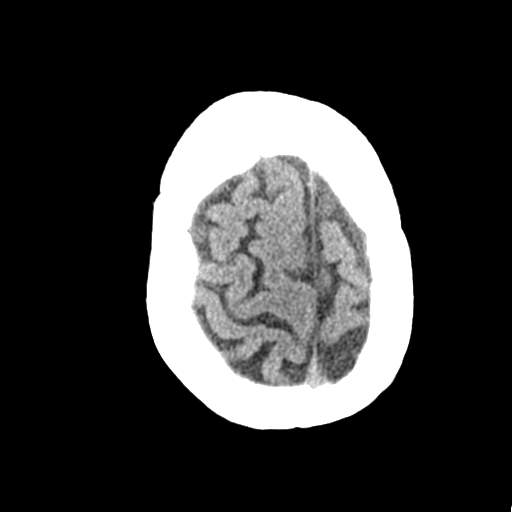
[im 31/34  brain]
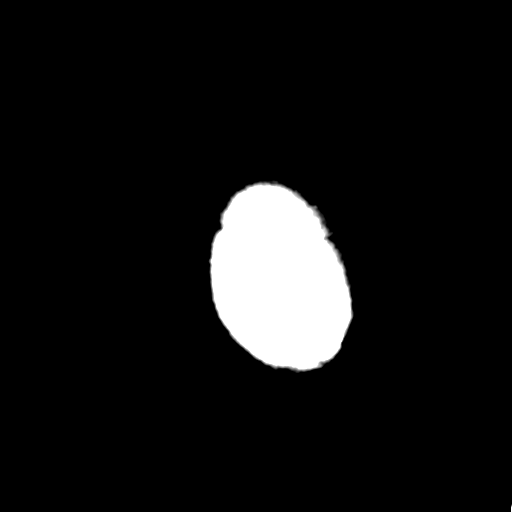

[Series 4: coronal soft tissue · coronal · 0.33mm/px · 3 of 68 slices shown]
[im 25/68  brain]
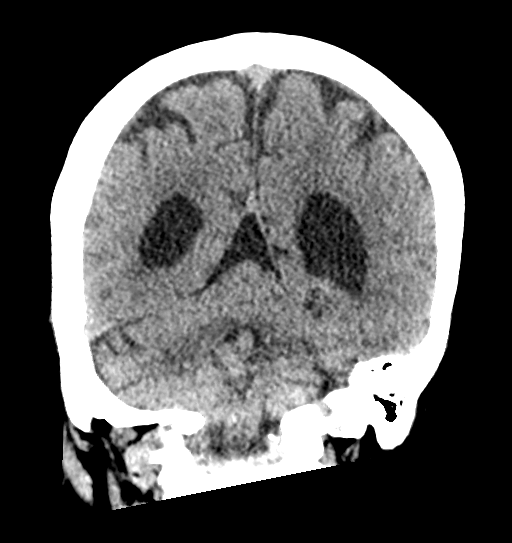
[im 31/68  brain]
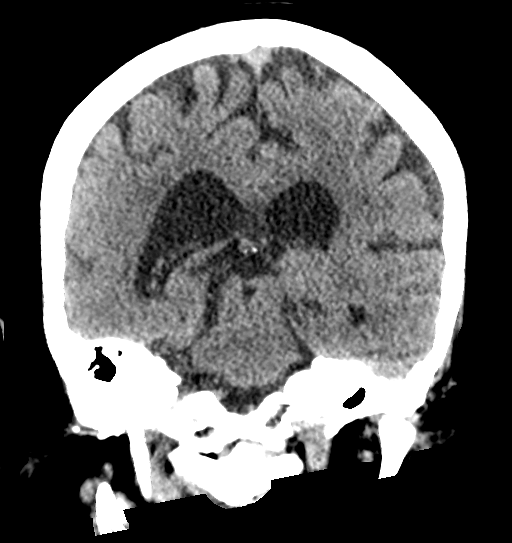
[im 37/68  brain]
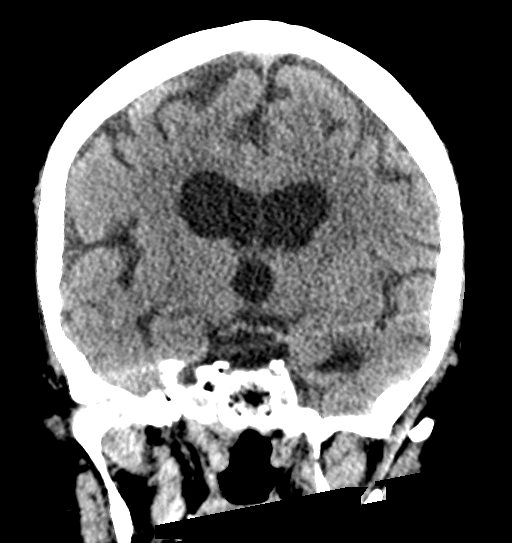

[Series 5: sagittal soft tissue · sagittal · 0.35mm/px · 3 of 56 slices shown]
[im 22/56  brain]
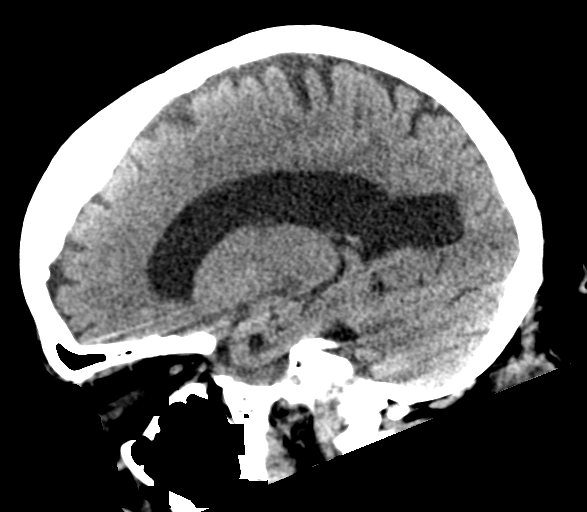
[im 28/56  brain]
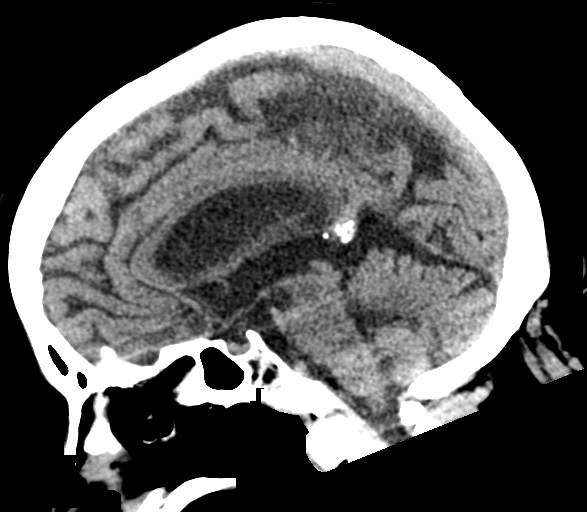
[im 34/56  brain]
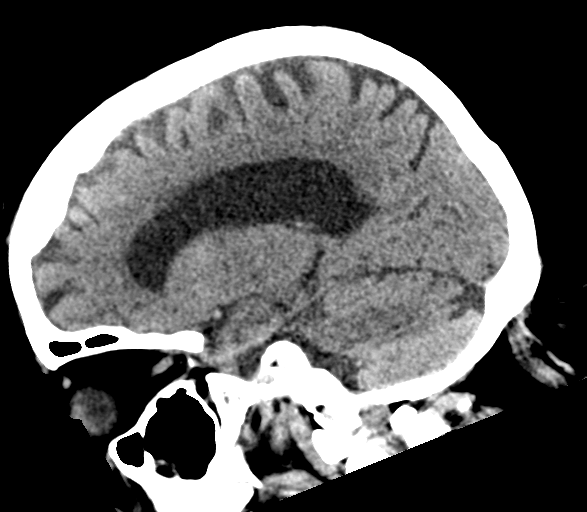

[14 of 47 positions shown; findings below may reference images not displayed]

FINDINGS: CT HEAD FINDINGS

Brain: Mild atrophy and white matter changes are stable. A remote
lacunar infarct is again seen in the right lentiform nucleus. No
acute infarct, hemorrhage, or mass lesion is present. The ventricles
are proportionate to the degree of atrophy. No significant
extraaxial fluid collection is present. The brainstem and cerebellum
are within normal limits.

Vascular: Atherosclerotic calcifications are present within the
cavernous internal carotid arteries bilaterally. Calcifications
present at the dural margin of both vertebral arteries. No
hyperdense vessel is present.

Skull: Calvarium is intact. No focal lytic or blastic lesions are
present. No significant extracranial soft tissue lesion is present.

Sinuses/Orbits: The paranasal sinuses and mastoid air cells are
clear. A left lens replacement is present. Globes and orbits are
otherwise within normal limits.

CT CERVICAL SPINE FINDINGS

Alignment: No significant listhesis is present. There is
straightening and slight reversal of the normal cervical lordosis.
Mild rightward curvature is present in the upper cervical spine.

Skull base and vertebrae: Craniocervical junction is normal.
Vertebral body heights are within normal limits. A fracture is
present anteriorly on the left along the inferior endplate of C6.
This predominantly involves the anterior endplate spur although
there is portion of the vertebral body. No associated soft tissue
edema or hematoma is evident.

No other fractures are present.

Soft tissues and spinal canal: No prevertebral fluid or swelling. No
visible canal hematoma. Atherosclerotic calcifications are present
at the carotid bifurcations bilaterally. Luminal narrowing is worse
left than right.

Disc levels: Multilevel degenerative changes are present. Central
and foraminal narrowing is greatest at C3-4 due to a broad-based
disc osteophyte complex and bilateral facet hypertrophy. Facet
spurring is greatest at C4-5, left greater than right. There is
ankylosis across the disc space at C4-5.

Upper chest: Lung apices are clear. Thoracic inlet is within normal
limits.
IMPRESSION: 1. Fracture along the anterior inferior endplate of C6 with minimal
displacement. No soft tissue changes are present. This fracture may
not be acute. MRI could be used for further evaluation and
assessment of acuity or any soft tissue injury.
2. No acute intracranial abnormality or significant interval change.
3. Stable atrophy and white matter disease.
4. Stable remote lacunar infarct of the right lentiform nucleus.
5. Multilevel degenerative changes of the cervical spine as
described.
6. Atherosclerosis.

These results were called by telephone at the time of interpretation
on 03/10/2019 at [DATE] to provider CHEPE EYES , who verbally
acknowledged these results.

## 2020-02-16 IMAGING — CT CT CERVICAL SPINE W/O CM
3 of 4 series · 9 of 33 positions shown, 10 images · non-contrast
Comparison: CT head without contrast 05/10/2017

CLINICAL DATA: HEAD TRAUMA. MODERATE TO SEVERE. ALTERED MENTAL
STATUS.

EXAM:
CT HEAD WITHOUT CONTRAST
CT CERVICAL SPINE WITHOUT CONTRAST
TECHNIQUE: Multidetector CT imaging of the head and cervical spine was
performed following the standard protocol without intravenous
contrast. Multiplanar CT image reconstructions of the cervical spine
were also generated.

[Series 6: sagittal bone · sagittal · 0.26mm/px · 5 of 55 slices shown]
[im 19/55  bone]
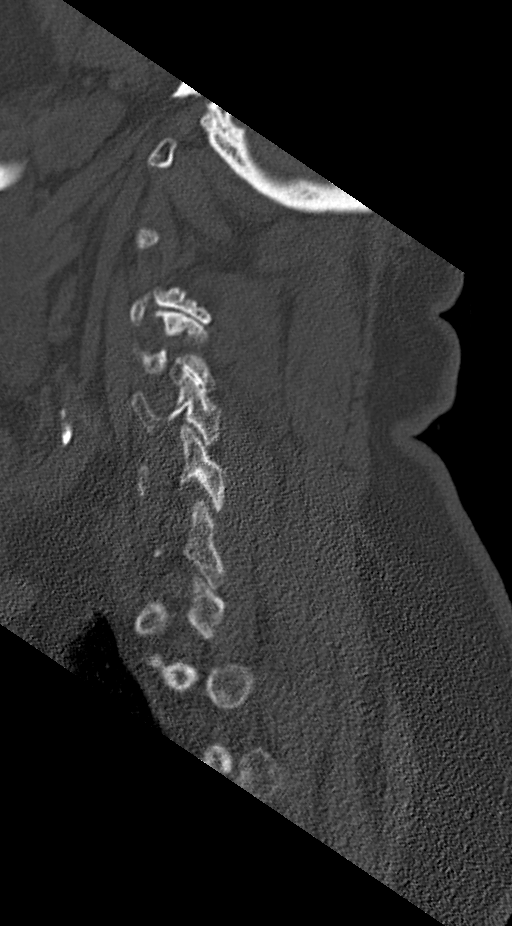
[im 23/55  bone]
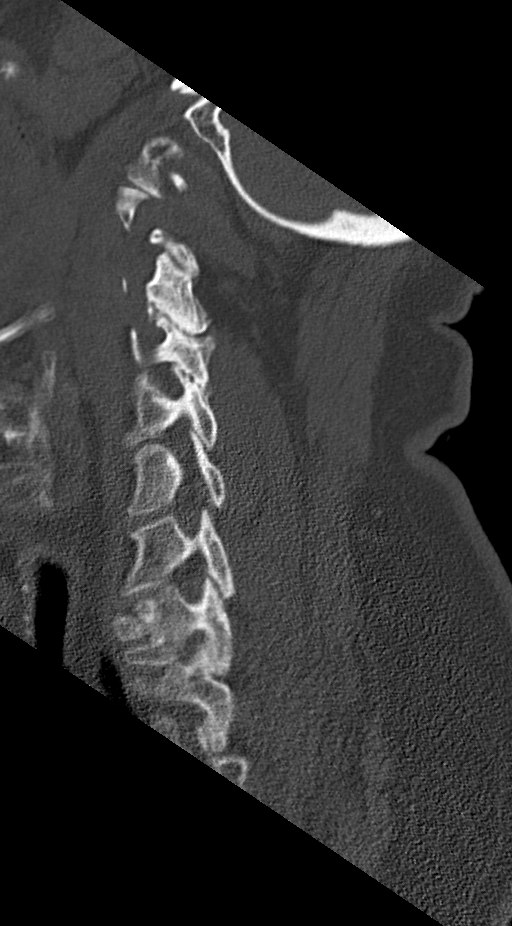
[im 28/55  bone]
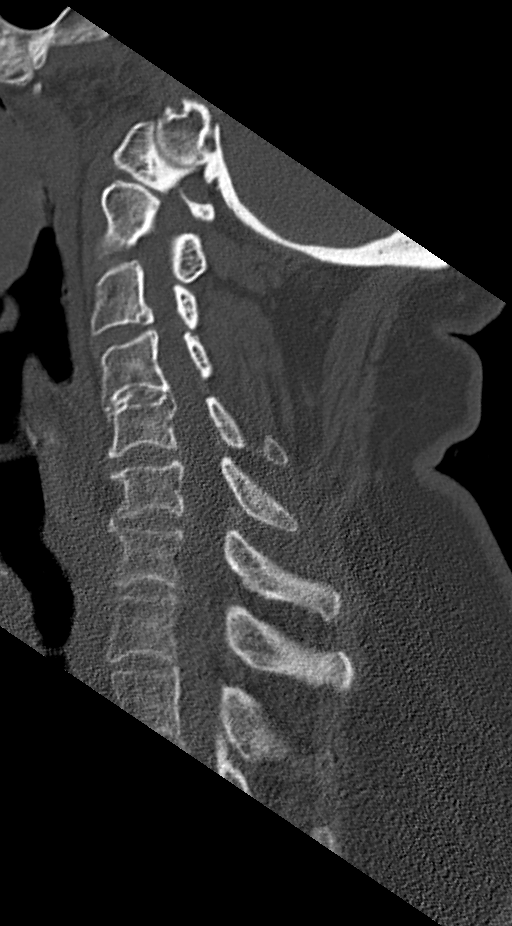
[im 32/55  bone]
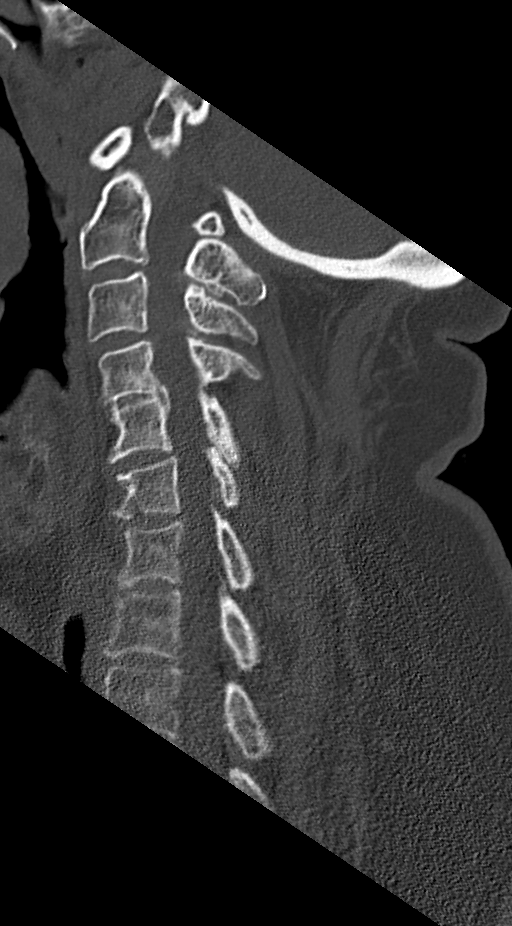
[im 37/55  bone]
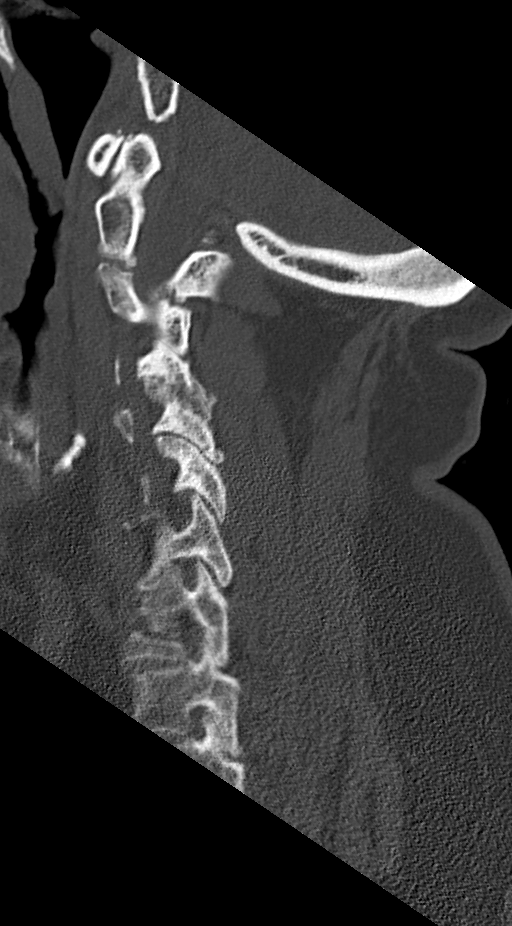

[Series 7: coronal bone · coronal · 0.21mm/px · 3 of 66 slices shown]
[im 20/66  bone]
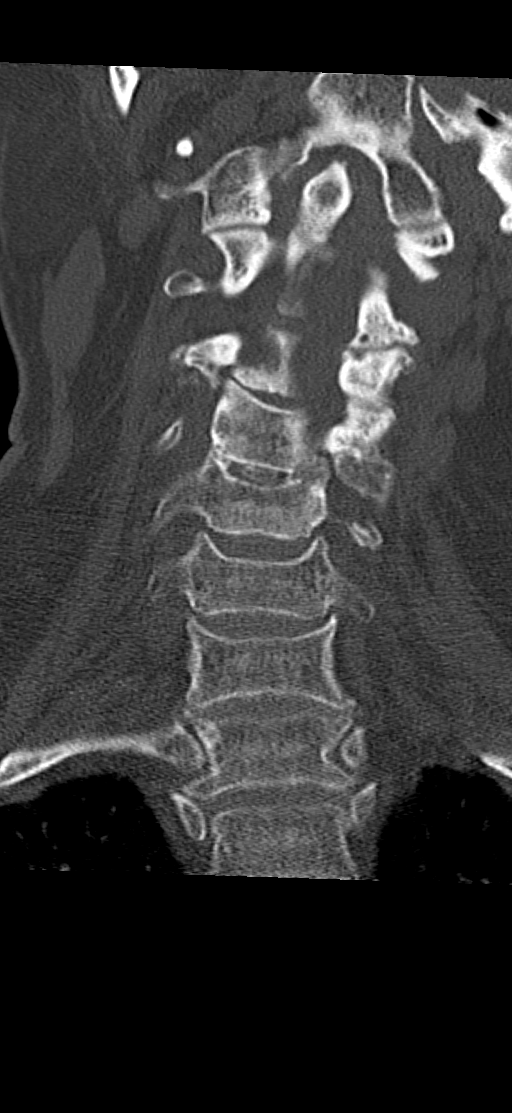
[im 29/66  bone]
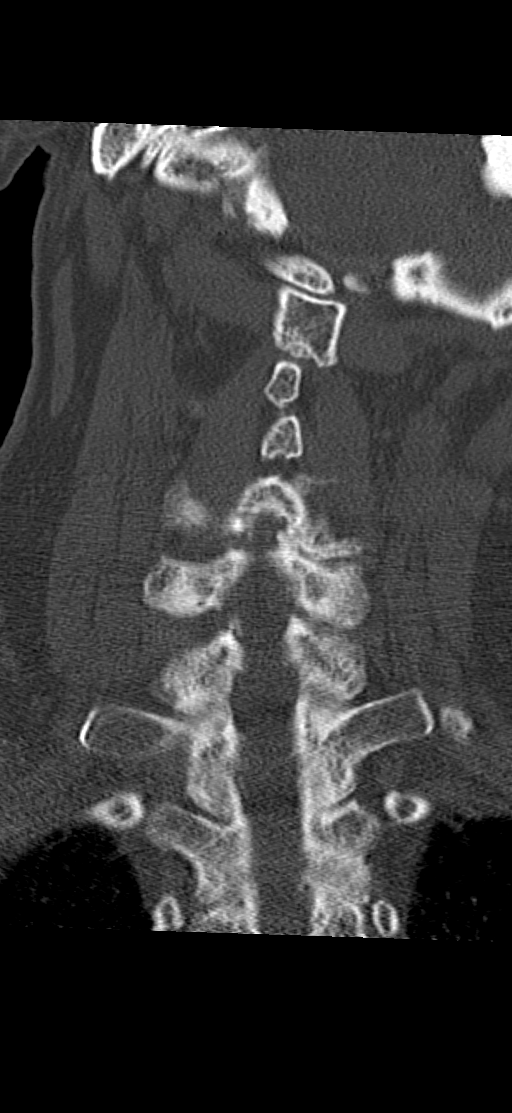
[im 37/66  bone]
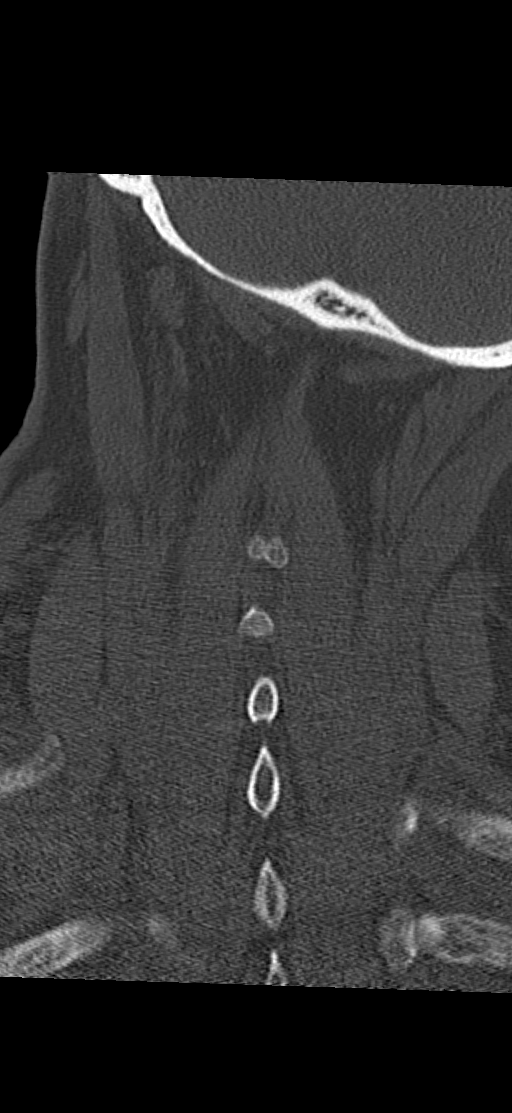

[Series 8: orthogonal bone · axial · 0.21mm/px · z∈[-248,-248]mm · 1 of 119 slices shown, 2 images]
[im 60/119  soft-tissue]
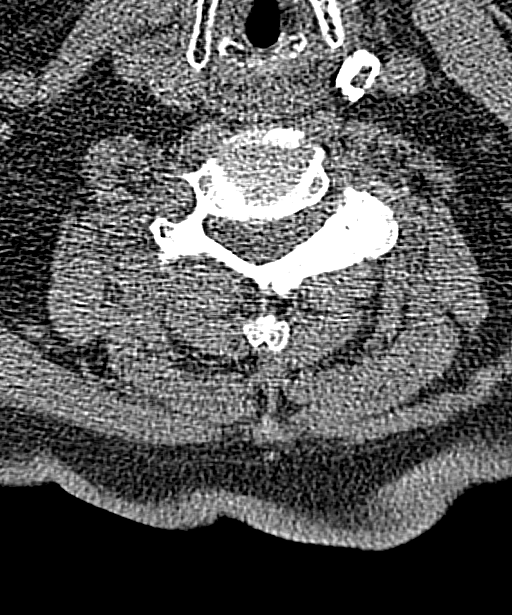
[im 60/119  bone]
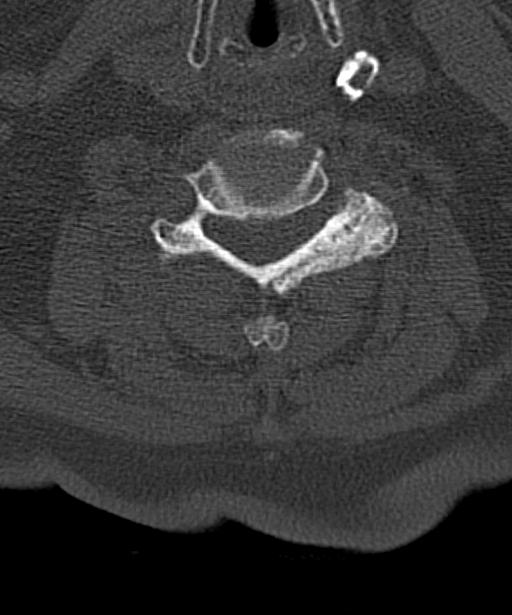

[9 of 33 positions shown; findings below may reference images not displayed]

FINDINGS: CT HEAD FINDINGS

Brain: Mild atrophy and white matter changes are stable. A remote
lacunar infarct is again seen in the right lentiform nucleus. No
acute infarct, hemorrhage, or mass lesion is present. The ventricles
are proportionate to the degree of atrophy. No significant
extraaxial fluid collection is present. The brainstem and cerebellum
are within normal limits.

Vascular: Atherosclerotic calcifications are present within the
cavernous internal carotid arteries bilaterally. Calcifications
present at the dural margin of both vertebral arteries. No
hyperdense vessel is present.

Skull: Calvarium is intact. No focal lytic or blastic lesions are
present. No significant extracranial soft tissue lesion is present.

Sinuses/Orbits: The paranasal sinuses and mastoid air cells are
clear. A left lens replacement is present. Globes and orbits are
otherwise within normal limits.

CT CERVICAL SPINE FINDINGS

Alignment: No significant listhesis is present. There is
straightening and slight reversal of the normal cervical lordosis.
Mild rightward curvature is present in the upper cervical spine.

Skull base and vertebrae: Craniocervical junction is normal.
Vertebral body heights are within normal limits. A fracture is
present anteriorly on the left along the inferior endplate of C6.
This predominantly involves the anterior endplate spur although
there is portion of the vertebral body. No associated soft tissue
edema or hematoma is evident.

No other fractures are present.

Soft tissues and spinal canal: No prevertebral fluid or swelling. No
visible canal hematoma. Atherosclerotic calcifications are present
at the carotid bifurcations bilaterally. Luminal narrowing is worse
left than right.

Disc levels: Multilevel degenerative changes are present. Central
and foraminal narrowing is greatest at C3-4 due to a broad-based
disc osteophyte complex and bilateral facet hypertrophy. Facet
spurring is greatest at C4-5, left greater than right. There is
ankylosis across the disc space at C4-5.

Upper chest: Lung apices are clear. Thoracic inlet is within normal
limits.
IMPRESSION: 1. Fracture along the anterior inferior endplate of C6 with minimal
displacement. No soft tissue changes are present. This fracture may
not be acute. MRI could be used for further evaluation and
assessment of acuity or any soft tissue injury.
2. No acute intracranial abnormality or significant interval change.
3. Stable atrophy and white matter disease.
4. Stable remote lacunar infarct of the right lentiform nucleus.
5. Multilevel degenerative changes of the cervical spine as
described.
6. Atherosclerosis.

These results were called by telephone at the time of interpretation
on 03/10/2019 at [DATE] to provider CHEPE EYES , who verbally
acknowledged these results.

## 2020-02-16 IMAGING — DX DG CHEST 1V PORT
1 series · 1 of 1 positions shown · non-contrast
Comparison: 01/12/2019

CLINICAL DATA: 73-year-old female with a history of altered mental
status

EXAM:
PORTABLE CHEST 1 VIEW

[chest ap]
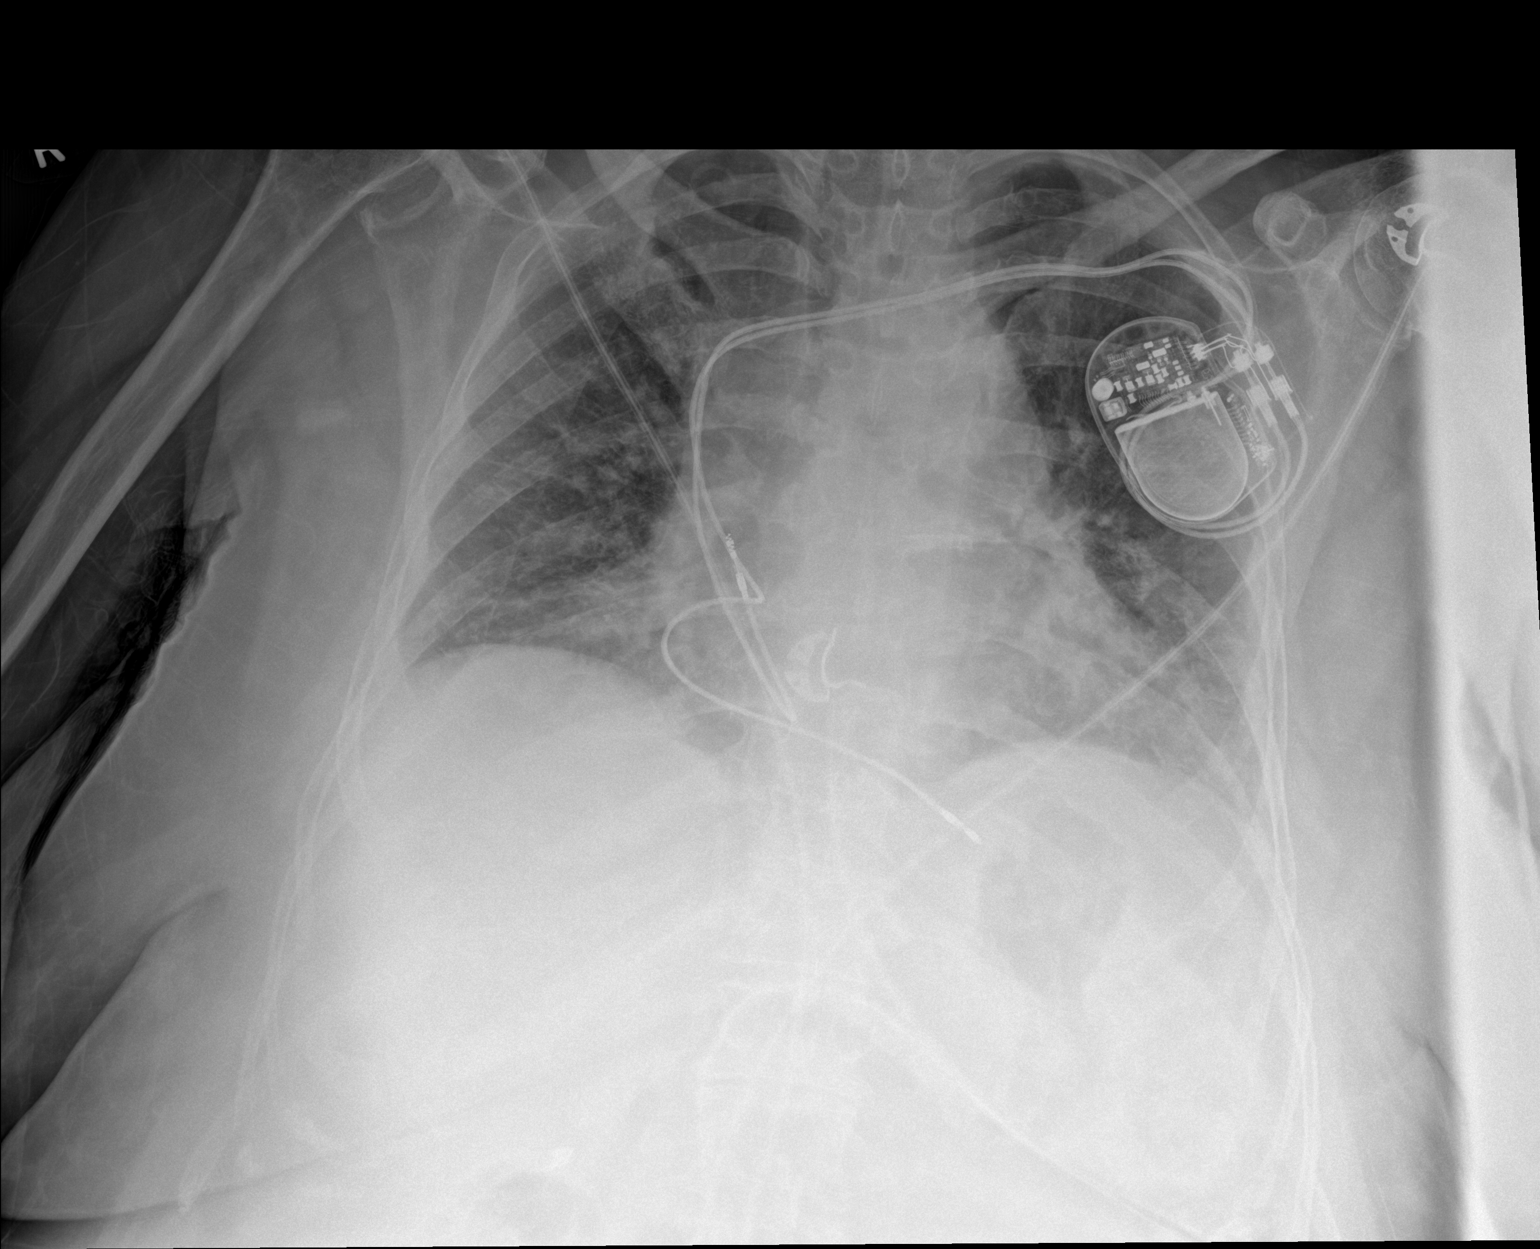

[1 of 1 positions shown; findings below may reference images not displayed]

FINDINGS: Cardiomediastinal silhouette unchanged in size and contour. Lower
lung volumes compared to the prior with developing opacities at the
lung bases partially obscuring the right heart border and the
retrocardiac region. No pneumothorax or pleural effusion.

Unchanged cardiac pacing device.
IMPRESSION: Low lung volumes with reticulonodular opacities at the lung bases,
potentially atelectasis/scarring and/or multifocal infection.

Unchanged cardiac pacing device.

## 2020-02-20 ENCOUNTER — Other Ambulatory Visit
Admission: RE | Admit: 2020-02-20 | Discharge: 2020-02-20 | Disposition: A | Discharge status: Home / Self Care | Payer: No Typology Code available for payment source | Source: Ambulatory Visit | Attending: Cardiology | Admitting: Cardiology

## 2020-02-20 ENCOUNTER — Other Ambulatory Visit: Payer: Self-pay

## 2020-02-20 DIAGNOSIS — Z20822 Contact with and (suspected) exposure to covid-19: Secondary | ICD-10-CM | POA: Diagnosis not present

## 2020-02-20 DIAGNOSIS — Z01812 Encounter for preprocedural laboratory examination: Secondary | ICD-10-CM | POA: Diagnosis present

## 2020-02-20 LAB — SARS CORONAVIRUS 2 (TAT 6-24 HRS): SARS Coronavirus 2: NEGATIVE

## 2020-02-24 ENCOUNTER — Ambulatory Visit
Admission: RE | Admit: 2020-02-24 | Discharge: 2020-02-24 | Disposition: A | Discharge status: Home / Self Care | Payer: Medicare Other | Attending: Cardiology | Admitting: Cardiology

## 2020-02-24 ENCOUNTER — Encounter
Admission: RE | Disposition: A | Discharge status: Home / Self Care | Payer: Self-pay | Source: Home / Self Care | Attending: Cardiology

## 2020-02-24 ENCOUNTER — Encounter: Payer: Self-pay | Admitting: Cardiology

## 2020-02-24 ENCOUNTER — Other Ambulatory Visit: Payer: Self-pay

## 2020-02-24 DIAGNOSIS — Z79899 Other long term (current) drug therapy: Secondary | ICD-10-CM | POA: Diagnosis not present

## 2020-02-24 DIAGNOSIS — N189 Chronic kidney disease, unspecified: Secondary | ICD-10-CM | POA: Diagnosis not present

## 2020-02-24 DIAGNOSIS — I130 Hypertensive heart and chronic kidney disease with heart failure and stage 1 through stage 4 chronic kidney disease, or unspecified chronic kidney disease: Secondary | ICD-10-CM | POA: Diagnosis not present

## 2020-02-24 DIAGNOSIS — E1122 Type 2 diabetes mellitus with diabetic chronic kidney disease: Secondary | ICD-10-CM | POA: Diagnosis not present

## 2020-02-24 DIAGNOSIS — Z45018 Encounter for adjustment and management of other part of cardiac pacemaker: Secondary | ICD-10-CM

## 2020-02-24 DIAGNOSIS — J449 Chronic obstructive pulmonary disease, unspecified: Secondary | ICD-10-CM | POA: Insufficient documentation

## 2020-02-24 DIAGNOSIS — E785 Hyperlipidemia, unspecified: Secondary | ICD-10-CM | POA: Insufficient documentation

## 2020-02-24 DIAGNOSIS — Z45010 Encounter for checking and testing of cardiac pacemaker pulse generator [battery]: Secondary | ICD-10-CM | POA: Diagnosis not present

## 2020-02-24 DIAGNOSIS — Z7982 Long term (current) use of aspirin: Secondary | ICD-10-CM | POA: Insufficient documentation

## 2020-02-24 DIAGNOSIS — Z7984 Long term (current) use of oral hypoglycemic drugs: Secondary | ICD-10-CM | POA: Diagnosis not present

## 2020-02-24 DIAGNOSIS — I495 Sick sinus syndrome: Secondary | ICD-10-CM | POA: Insufficient documentation

## 2020-02-24 DIAGNOSIS — E039 Hypothyroidism, unspecified: Secondary | ICD-10-CM | POA: Diagnosis not present

## 2020-02-24 DIAGNOSIS — I5032 Chronic diastolic (congestive) heart failure: Secondary | ICD-10-CM | POA: Diagnosis not present

## 2020-02-24 DIAGNOSIS — F17210 Nicotine dependence, cigarettes, uncomplicated: Secondary | ICD-10-CM | POA: Insufficient documentation

## 2020-02-24 DIAGNOSIS — Z79890 Hormone replacement therapy (postmenopausal): Secondary | ICD-10-CM | POA: Diagnosis not present

## 2020-02-24 HISTORY — PX: PPM GENERATOR CHANGEOUT: EP1233

## 2020-02-24 LAB — GLUCOSE, CAPILLARY: Glucose-Capillary: 177 mg/dL — ABNORMAL HIGH (ref 70–99)

## 2020-02-24 SURGERY — PPM GENERATOR CHANGEOUT
Anesthesia: Moderate Sedation

## 2020-02-24 MED ORDER — SODIUM CHLORIDE 0.9 % IV SOLN: INTRAVENOUS | Status: DC

## 2020-02-24 MED ORDER — HEPARIN (PORCINE) IN NACL 1000-0.9 UT/500ML-% IV SOLN
INTRAVENOUS | Status: AC
  Filled 2020-02-24: qty 1000

## 2020-02-24 MED ORDER — LIDOCAINE HCL (PF) 1 % IJ SOLN
INTRAMUSCULAR | Status: DC | PRN
  Administered 2020-02-24: 30 mL

## 2020-02-24 MED ORDER — HEPARIN (PORCINE) IN NACL 1000-0.9 UT/500ML-% IV SOLN
INTRAVENOUS | Status: DC | PRN
  Administered 2020-02-24: 500 mL

## 2020-02-24 MED ORDER — CEPHALEXIN 250 MG PO CAPS: 500.0000 mg | ORAL_CAPSULE | Freq: Two times a day (BID) | ORAL | 0 refills | Status: DC

## 2020-02-24 MED ORDER — SODIUM CHLORIDE 0.9 % IV SOLN: INTRAVENOUS | Status: DC | PRN

## 2020-02-24 MED ORDER — DIPHENHYDRAMINE HCL 50 MG/ML IJ SOLN
INTRAMUSCULAR | Status: DC | PRN
  Administered 2020-02-24: 25 mg via INTRAVENOUS

## 2020-02-24 MED ORDER — MIDAZOLAM HCL 2 MG/2ML IJ SOLN
INTRAMUSCULAR | Status: AC
  Filled 2020-02-24: qty 2

## 2020-02-24 MED ORDER — DIPHENHYDRAMINE HCL 50 MG/ML IJ SOLN
INTRAMUSCULAR | Status: AC
  Filled 2020-02-24: qty 1

## 2020-02-24 MED ORDER — CEFAZOLIN SODIUM-DEXTROSE 2-4 GM/100ML-% IV SOLN
INTRAVENOUS | Status: AC
  Filled 2020-02-24: qty 100

## 2020-02-24 MED ORDER — SODIUM CHLORIDE 0.9 % IV SOLN
80.0000 mg | INTRAVENOUS | Status: DC
  Filled 2020-02-24: qty 2

## 2020-02-24 MED ORDER — ONDANSETRON HCL 4 MG/2ML IJ SOLN: 4.0000 mg | Freq: Four times a day (QID) | INTRAMUSCULAR | Status: DC | PRN

## 2020-02-24 MED ORDER — MIDAZOLAM HCL 2 MG/2ML IJ SOLN
INTRAMUSCULAR | Status: DC | PRN
  Administered 2020-02-24 (×2): 0.5 mg via INTRAVENOUS

## 2020-02-24 MED ORDER — CEFAZOLIN SODIUM-DEXTROSE 2-4 GM/100ML-% IV SOLN
2.0000 g | INTRAVENOUS | Status: AC
Start: 2020-02-24 — End: 2020-02-24
  Administered 2020-02-24: 2 g via INTRAVENOUS

## 2020-02-24 MED ORDER — FENTANYL CITRATE (PF) 100 MCG/2ML IJ SOLN
INTRAMUSCULAR | Status: AC
  Filled 2020-02-24: qty 2

## 2020-02-24 MED ORDER — ACETAMINOPHEN 325 MG PO TABS: 325.0000 mg | ORAL_TABLET | ORAL | Status: DC | PRN

## 2020-02-24 MED ORDER — CHLORHEXIDINE GLUCONATE CLOTH 2 % EX PADS
6.0000 | MEDICATED_PAD | Freq: Every day | CUTANEOUS | Status: DC
  Administered 2020-02-24: 6 via TOPICAL

## 2020-02-24 MED ORDER — LIDOCAINE HCL (PF) 1 % IJ SOLN
INTRAMUSCULAR | Status: AC
  Filled 2020-02-24: qty 30

## 2020-02-24 SURGICAL SUPPLY — 7 items
CABLE SURG 12 DISP A/V CHANNEL (MISCELLANEOUS) ×2 IMPLANT
DEVICE DSSCT PLSMBLD 3.0S LGHT (MISCELLANEOUS) ×1 IMPLANT
IPG PACE AZUR XT DR MRI W1DR01 (Pacemaker) ×1 IMPLANT
PACE AZURE XT DR MRI W1DR01 (Pacemaker) ×2 IMPLANT
PACK PACE INSERTION (MISCELLANEOUS) ×2 IMPLANT
PAD ELECT DEFIB RADIOL ZOLL (MISCELLANEOUS) ×2 IMPLANT
PLASMABLADE 3.0S W/LIGHT (MISCELLANEOUS) ×2

## 2020-02-24 NOTE — Discharge Instructions (Signed)
Remove outer bandage on 02/25/2020.  Leave Steri-Strips on.

## 2020-02-24 NOTE — Progress Notes (Signed)
Per Dr. Darrold Junker patient does not need labs this am, provider spoke with patient , Miss Melanie King understanding of procedure scheduled for today.

## 2020-02-24 NOTE — Progress Notes (Signed)
Patient arrived with caregiver, per facility baseline for patient:  Pleasantly confused, hard of hearing. Patient knows her name and procedure scheduled for today and her birthday. Per facility took all medications last pm , none today.  Note:  Right upper extremity with cast on:  fx elbow.

## 2020-02-24 NOTE — Progress Notes (Signed)
Report given to "Dustin" at [facility], living facility which Miss Toole resides.

## 2020-02-24 NOTE — Progress Notes (Signed)
Patient tolerating sandwich/soda without event.  Caregiver :  Thurston Hole called and updated Left chest wall dressing with old drainage, provider aware. Patient more awake/alert, interacting with staff, aware of completion of procedure and f/u as needed.

## 2020-02-24 NOTE — Progress Notes (Signed)
Consent for procedure obtained in office. Dr. Darrold Junker  made aware no labs ordered for procedure.  Per facility patient more awake and alert in afternoon when consent was obtained.

## 2020-02-26 IMAGING — CR DG HIP (WITH OR WITHOUT PELVIS) 2-3V*R*
3 series · 3 of 3 positions shown · non-contrast
Comparison: 06/29/2017

CLINICAL DATA: Bilateral hip pain, unwitnessed fall

EXAM:
DG HIP (WITH OR WITHOUT PELVIS) 2-3V RIGHT

[pelvis ap]
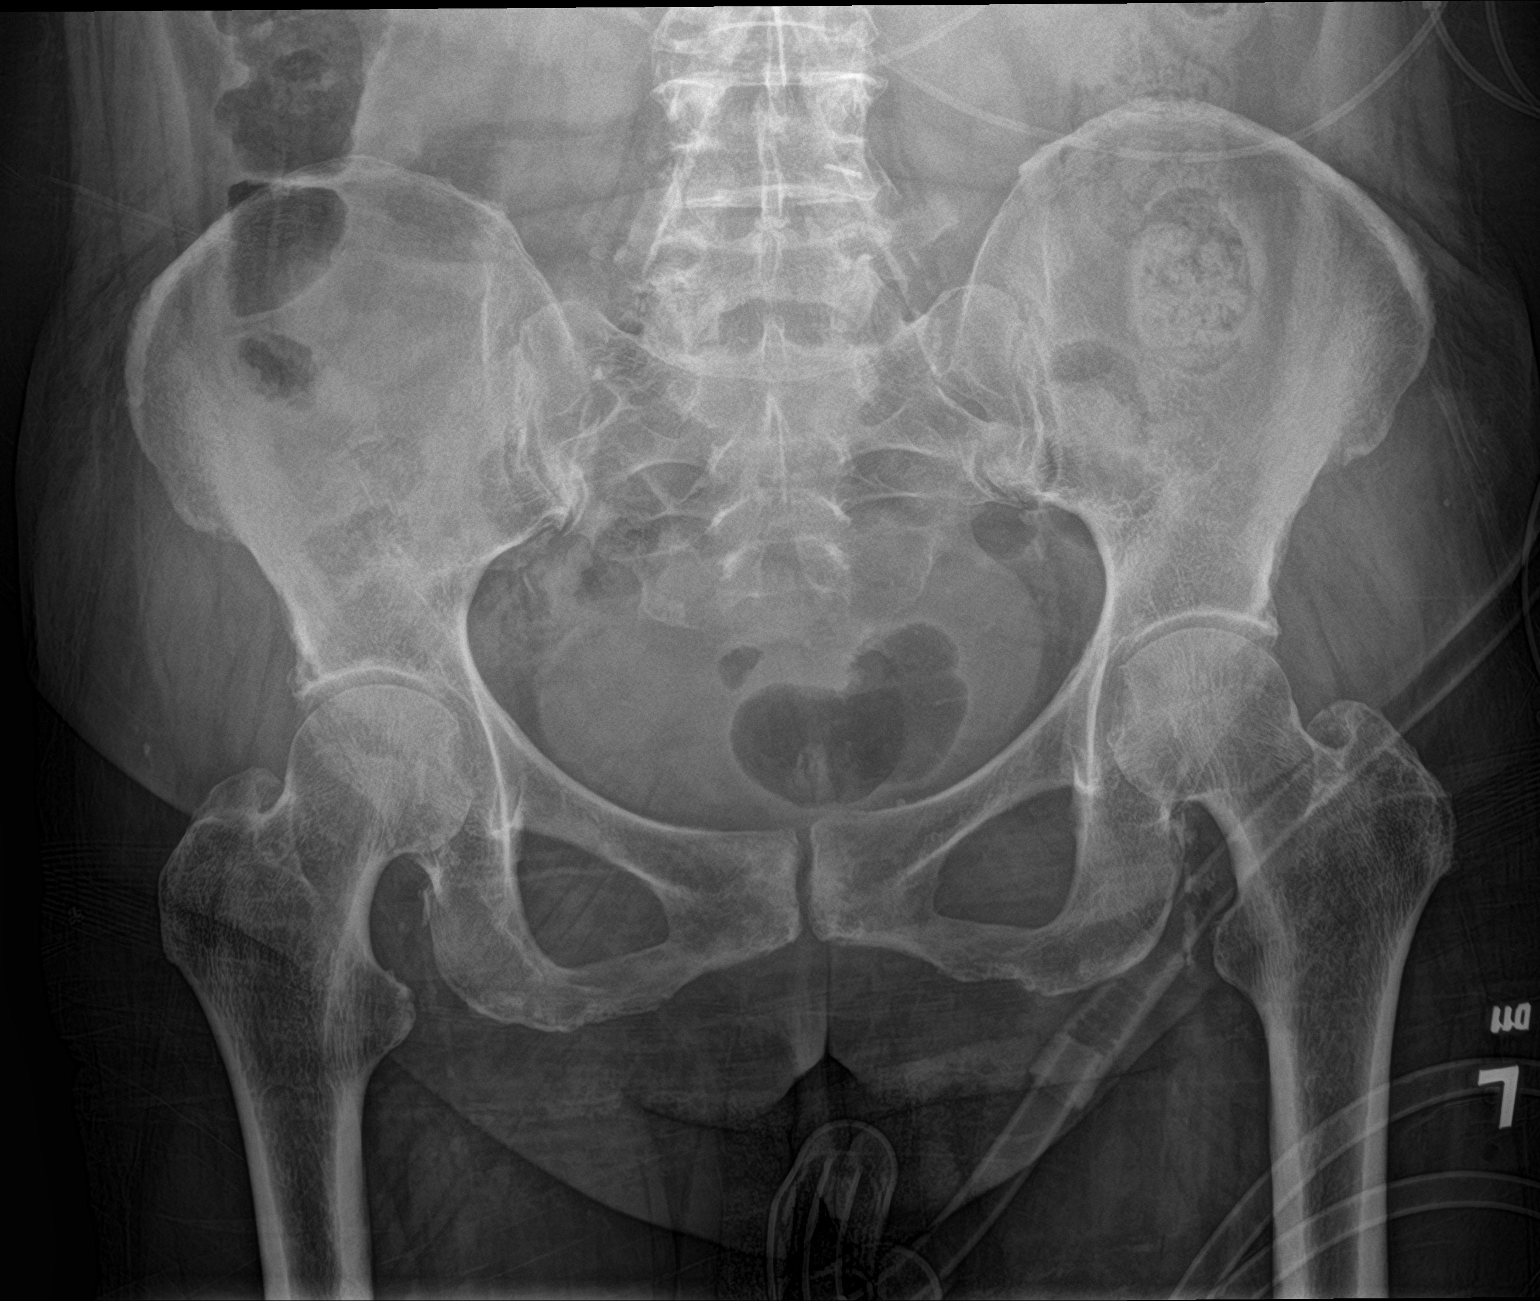

[hip ap]
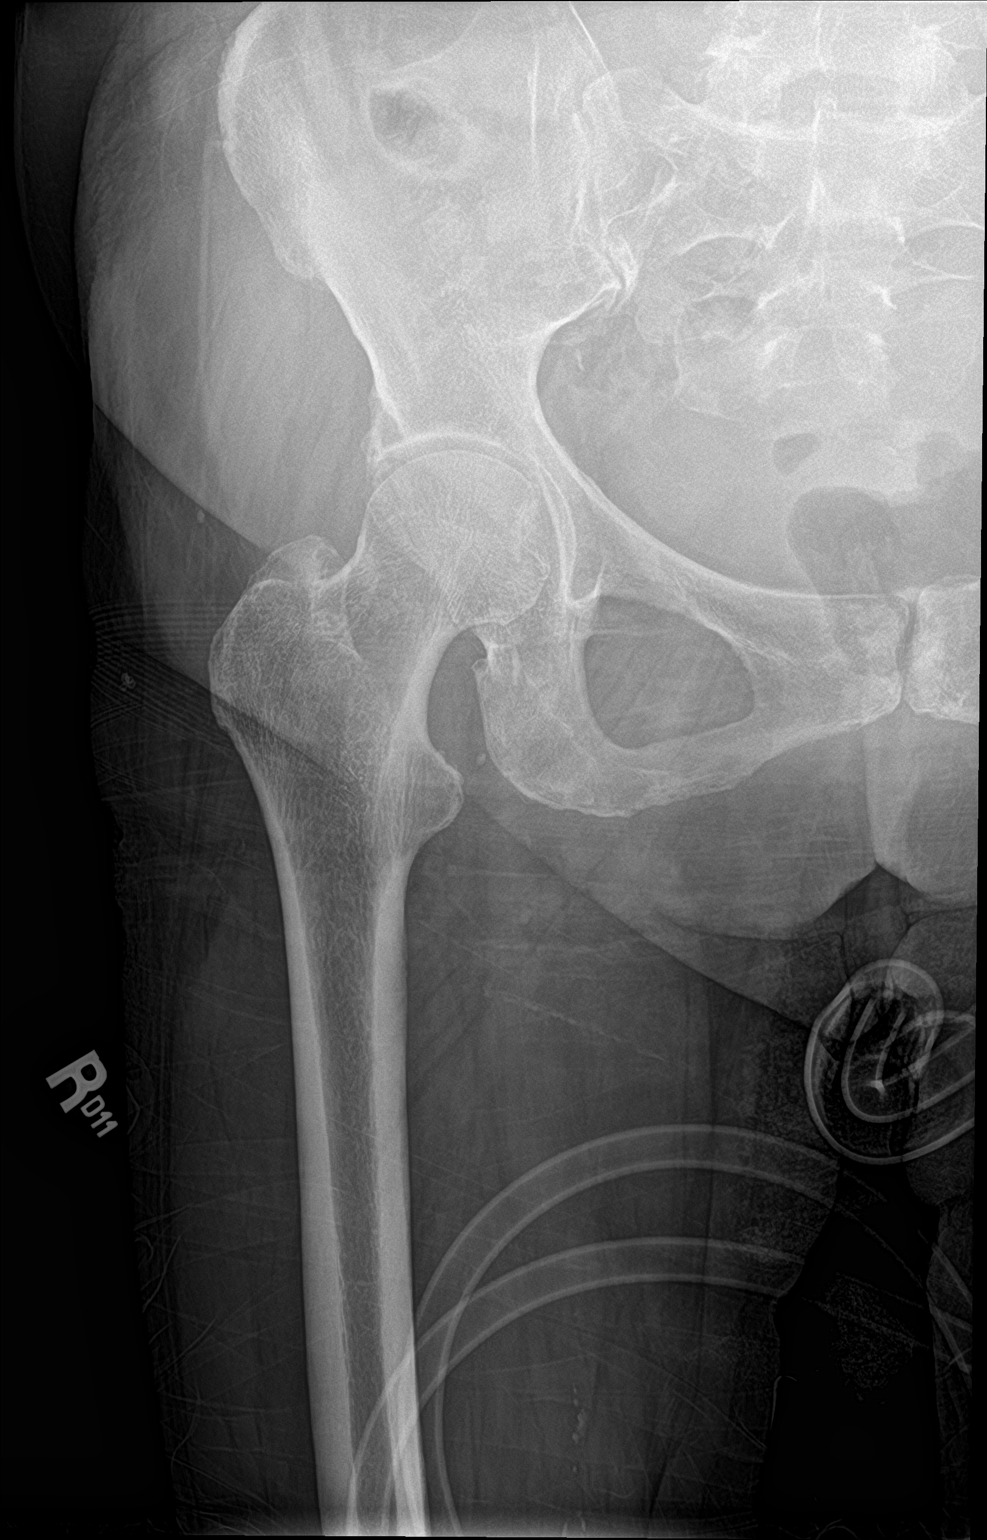

[hip lat]
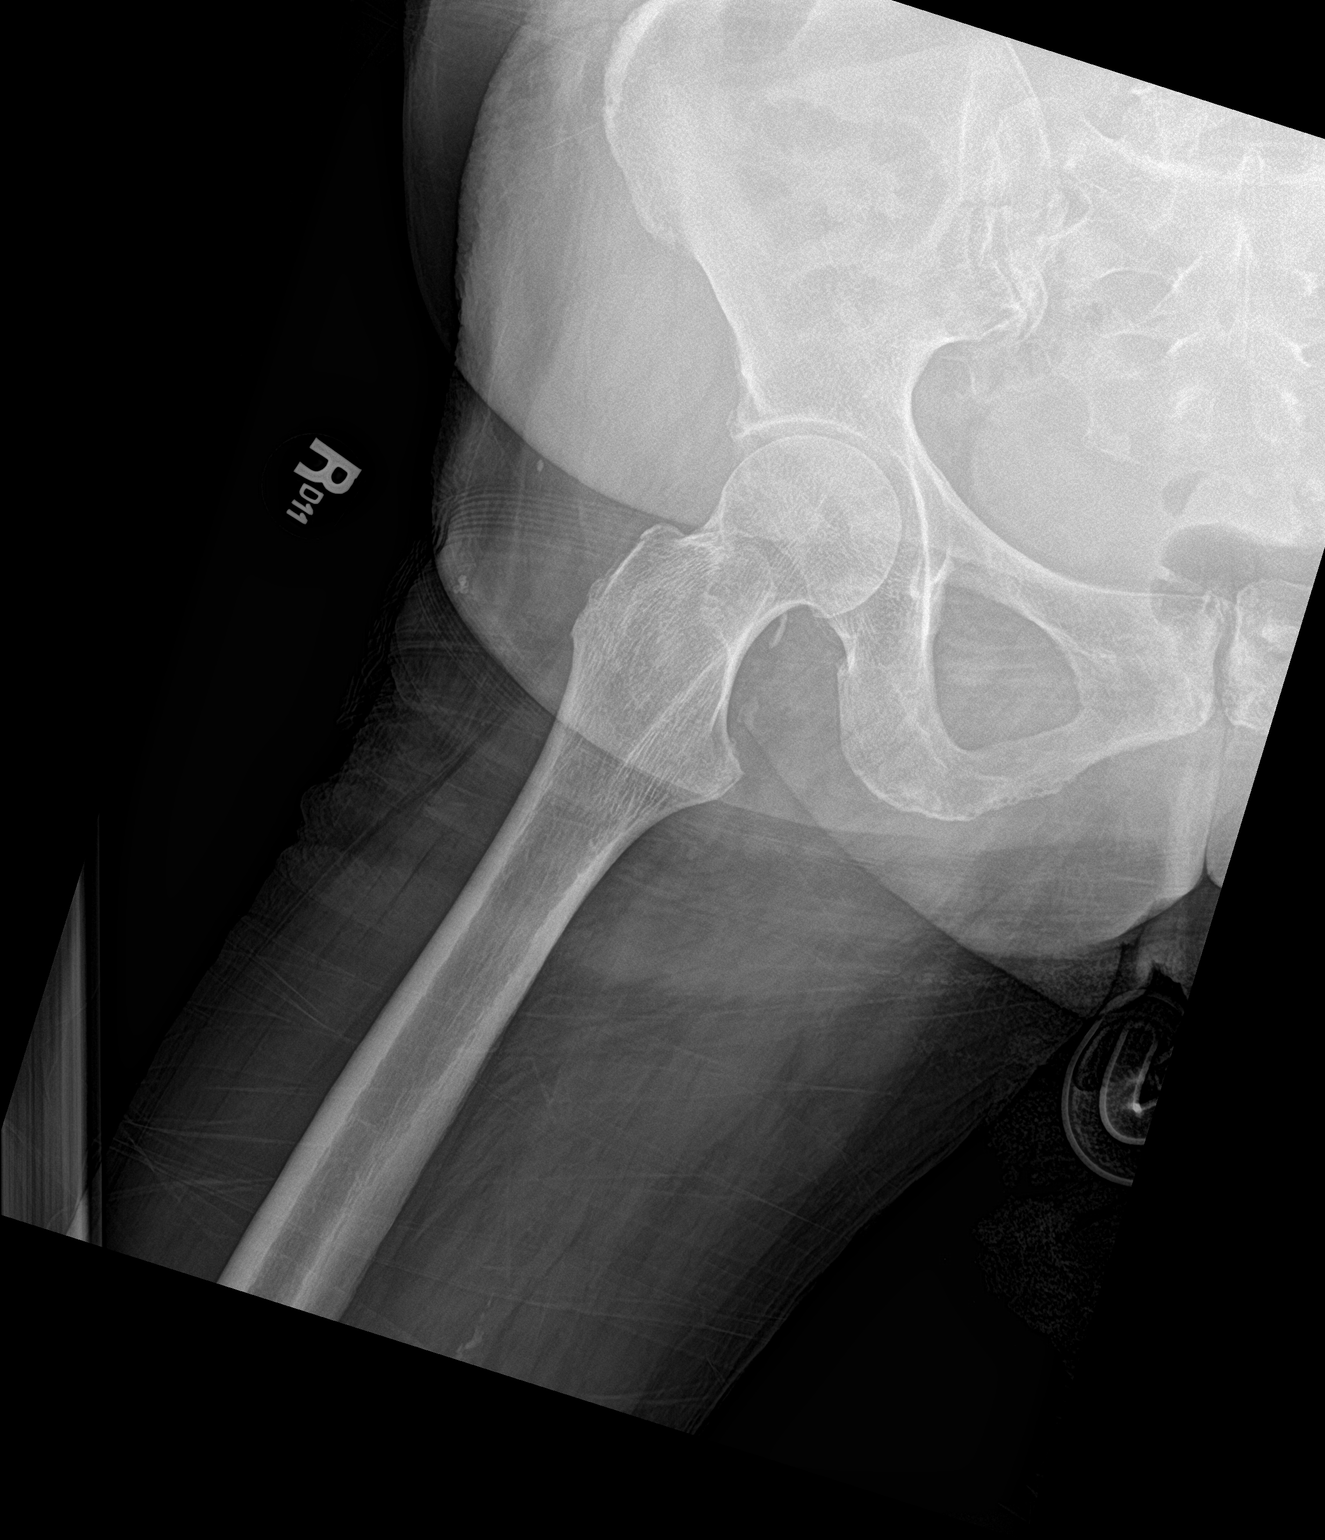

[3 of 3 positions shown; findings below may reference images not displayed]

FINDINGS: Hip joints and SI joints are symmetric and unremarkable. No acute
bony abnormality. Specifically, no fracture, subluxation, or
dislocation.
IMPRESSION: No acute bony abnormality.

## 2020-02-26 IMAGING — CT CT HEAD W/O CM
3 of 6 series · 14 of 47 positions shown, 17 images · non-contrast
Comparison: CT head 03/20/2019

CLINICAL DATA: Subdural hematoma, unwitnessed fall, known C6
fracture

EXAM:
CT HEAD WITHOUT CONTRAST
TECHNIQUE: Contiguous axial images were obtained from the base of the skull
through the vertex without intravenous contrast.

[Series 3: head wo · axial · 0.45mm/px · z∈[+1424,+1559]mm · 9 of 31 slices shown, 12 images]
[im 2/31  brain]
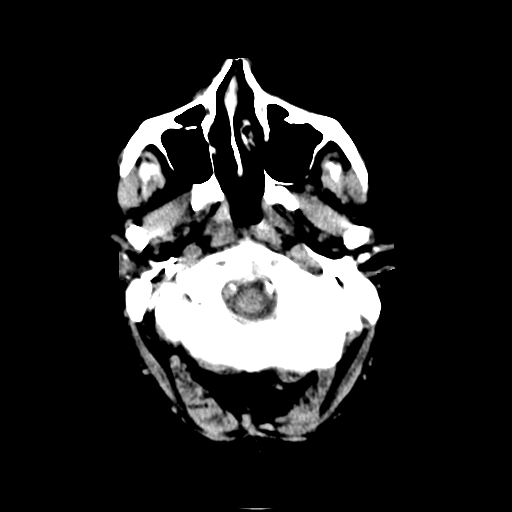
[im 2/31  bone]
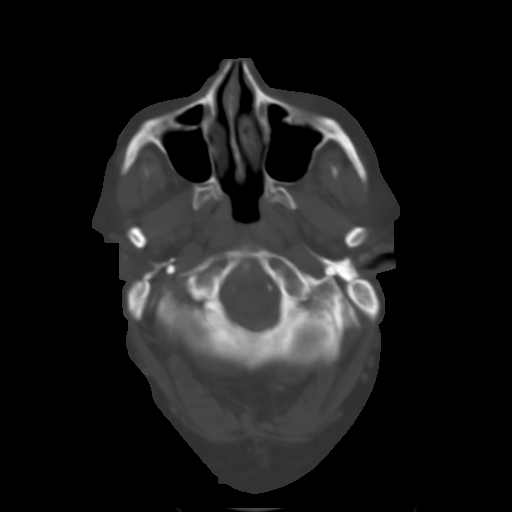
[im 6/31  brain]
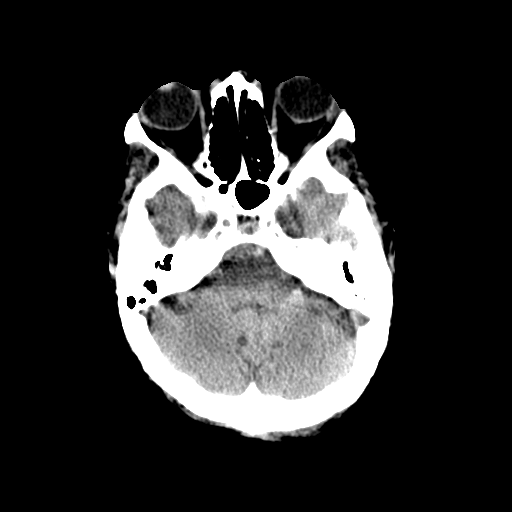
[im 10/31  brain]
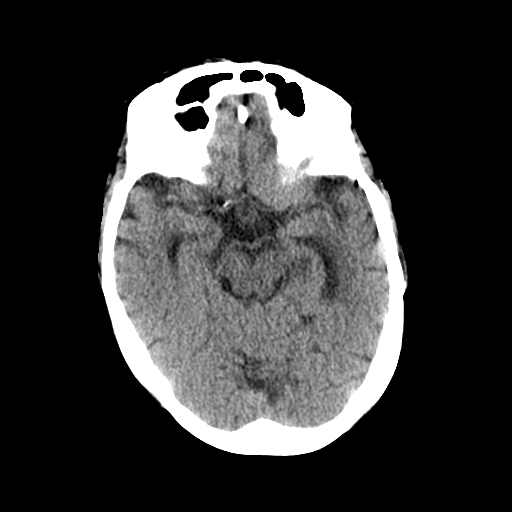
[im 12/31  brain]
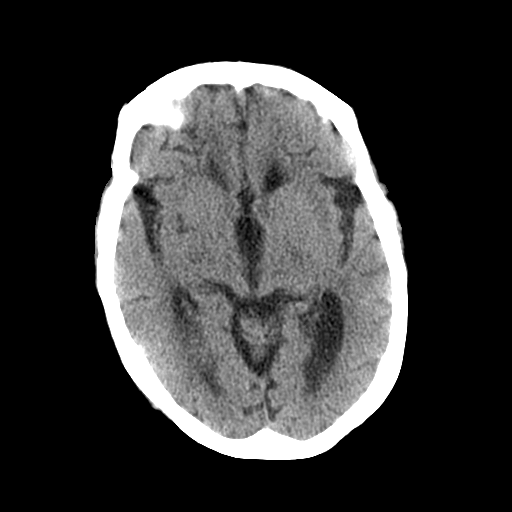
[im 16/31  brain]
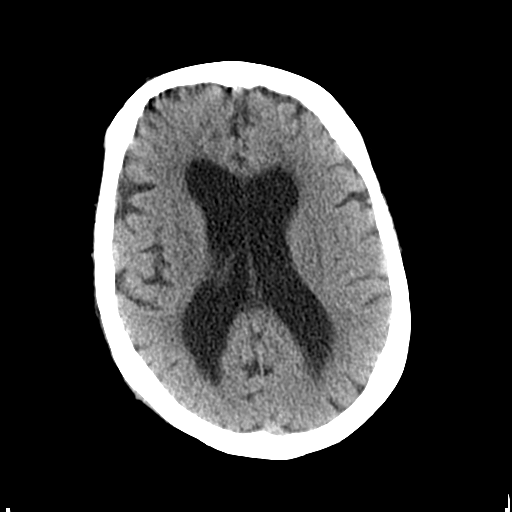
[im 16/31  bone]
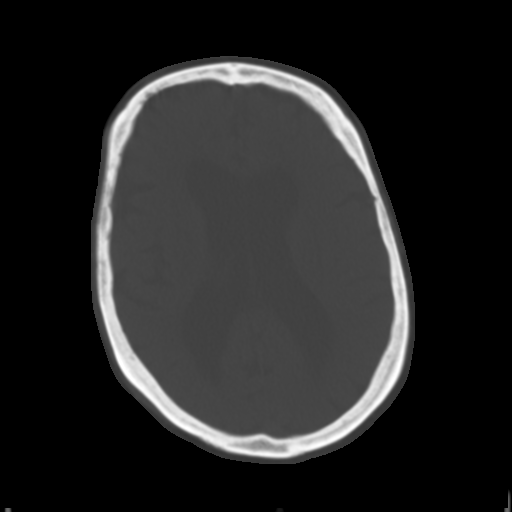
[im 19/31  brain]
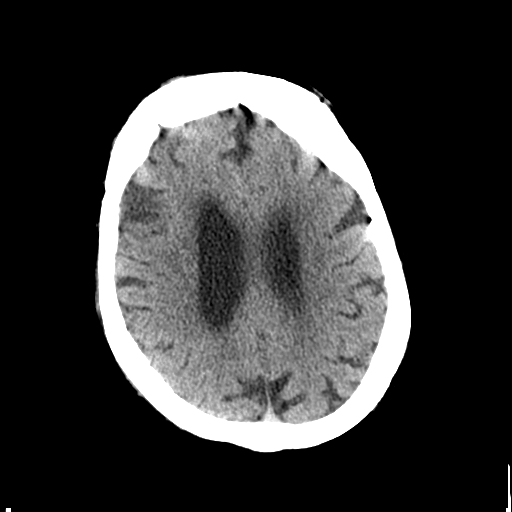
[im 21/31  brain]
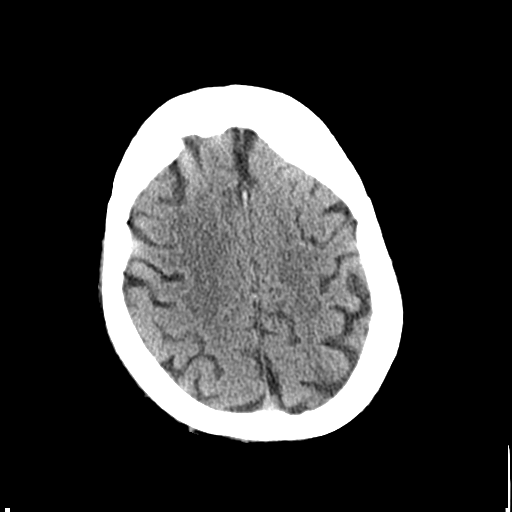
[im 25/31  brain]
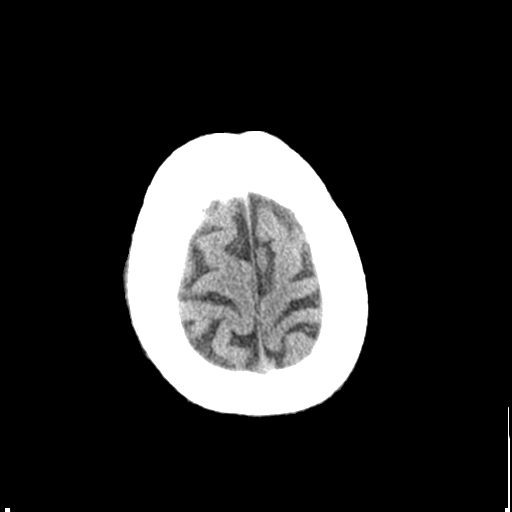
[im 29/31  brain]
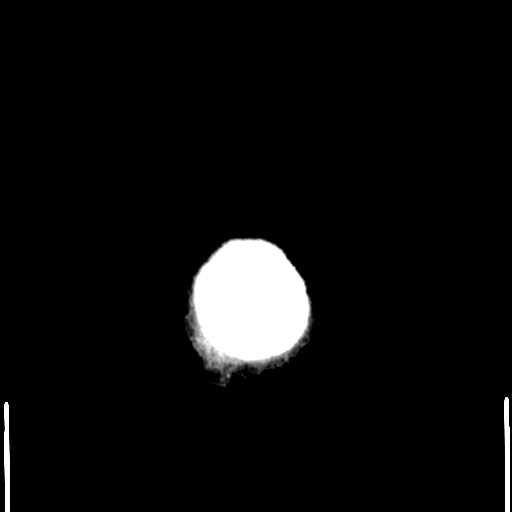
[im 29/31  bone]
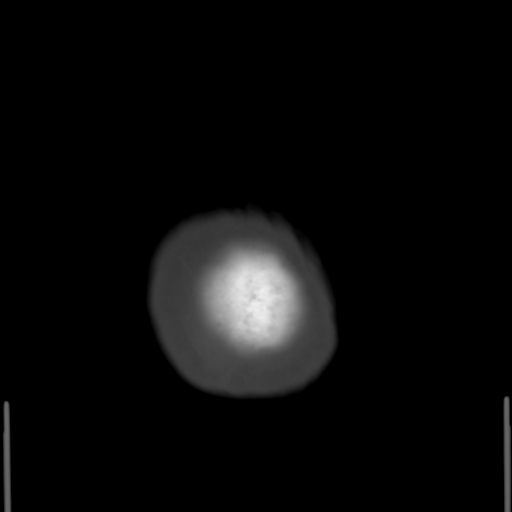

[Series 4: coronal soft tissue · coronal · 0.31mm/px · 3 of 62 slices shown]
[im 16/62  brain]
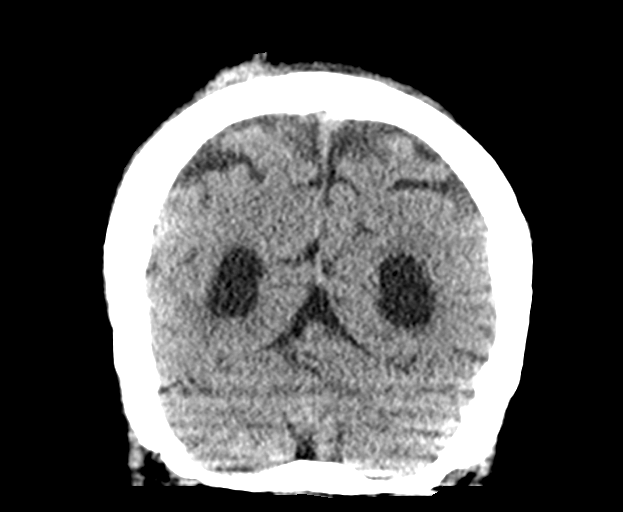
[im 31/62  brain]
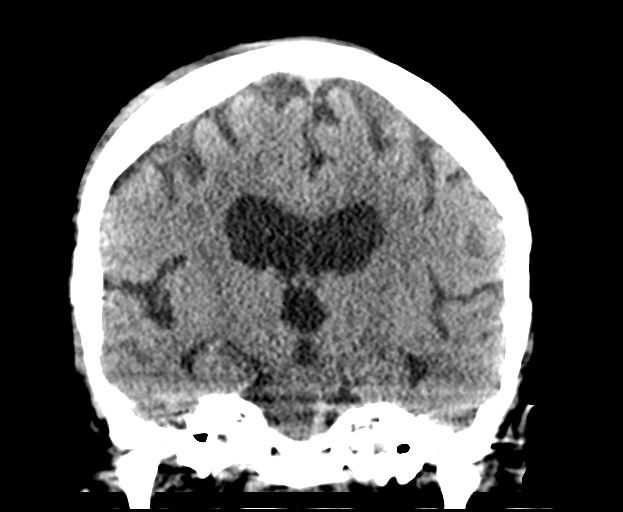
[im 46/62  brain]
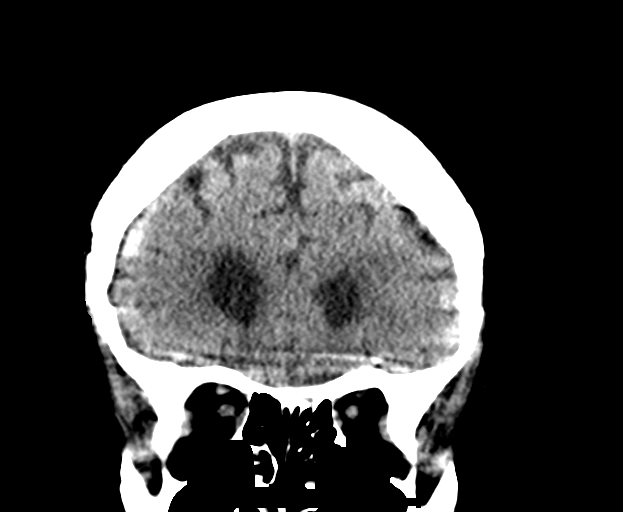

[Series 9: sagittal soft tissue · sagittal · 0.23mm/px · 2 of 50 slices shown]
[im 17/50  brain]
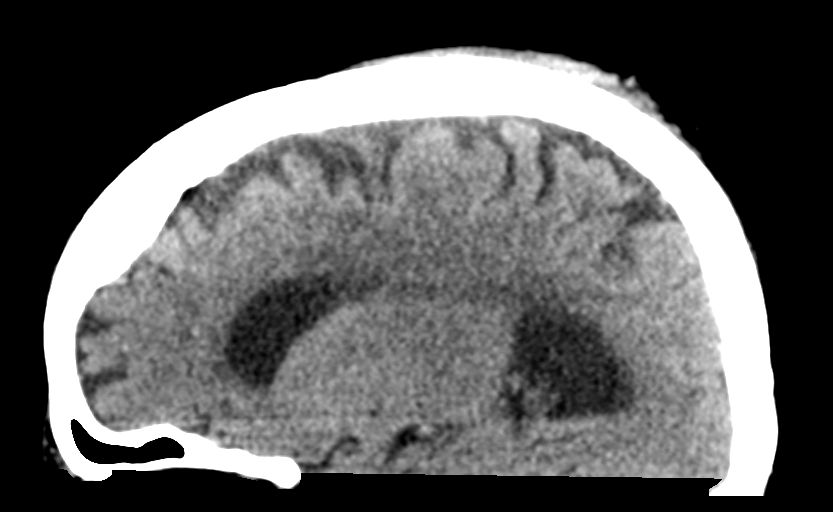
[im 33/50  brain]
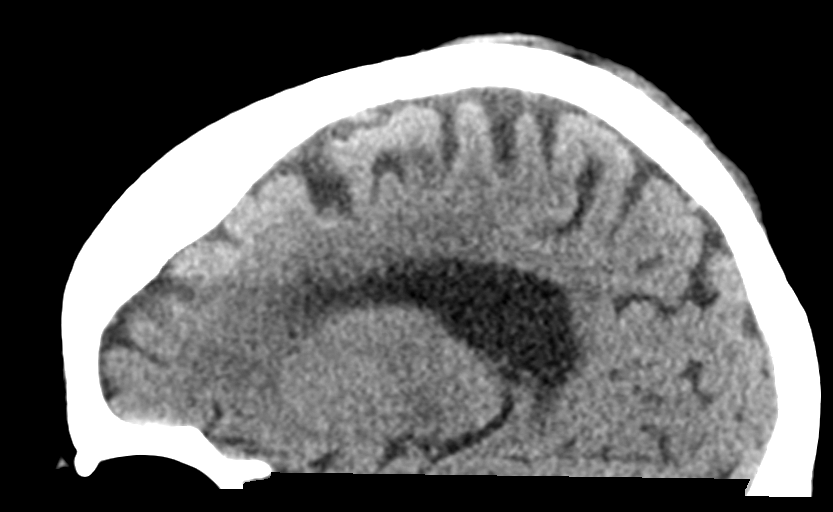

[14 of 47 positions shown; findings below may reference images not displayed]

FINDINGS: Brain: Stable appearance of a 2 mm right parafalcine subdural
hematoma. No significant interval expansion. No new areas of
hemorrhage. No CT evidence of acute large territory infarct. No mass
effect or midline shift. Symmetric prominence of the ventricles,
cisterns and sulci compatible with parenchymal volume loss. Patchy
areas of white matter hypoattenuation are most compatible with
chronic microvascular angiopathy.

Vascular: Atherosclerotic calcification of the carotid siphons and
intradural vertebral arteries. No hyperdense vessel.

Skull: Diminished soft tissue swelling and scalp thickening with a
right posterior parietal scalp hematoma. No subjacent calvarial
fracture. No new areas of scalp swelling or suspicious osseous
lesions.

Sinuses/Orbits: Paranasal sinuses and mastoid air cells are
predominantly clear.

Other: None
IMPRESSION: 1. Stable appearance of a 2 mm right parafalcine subdural hematoma.
2. No new sites of hemorrhage or other acute intracranial
abnormality.
3. Diminished soft tissue swelling and scalp thickening with a right
posterior parietal scalp hematoma.
4. Background of chronic microvascular angiopathy and parenchymal
volume loss.

## 2020-02-26 IMAGING — CR DG CHEST 1V PORT
1 series · 1 of 1 positions shown · non-contrast
Comparison: 03/10/2019

CLINICAL DATA: Fall

EXAM:
PORTABLE CHEST 1 VIEW

[chest ap]
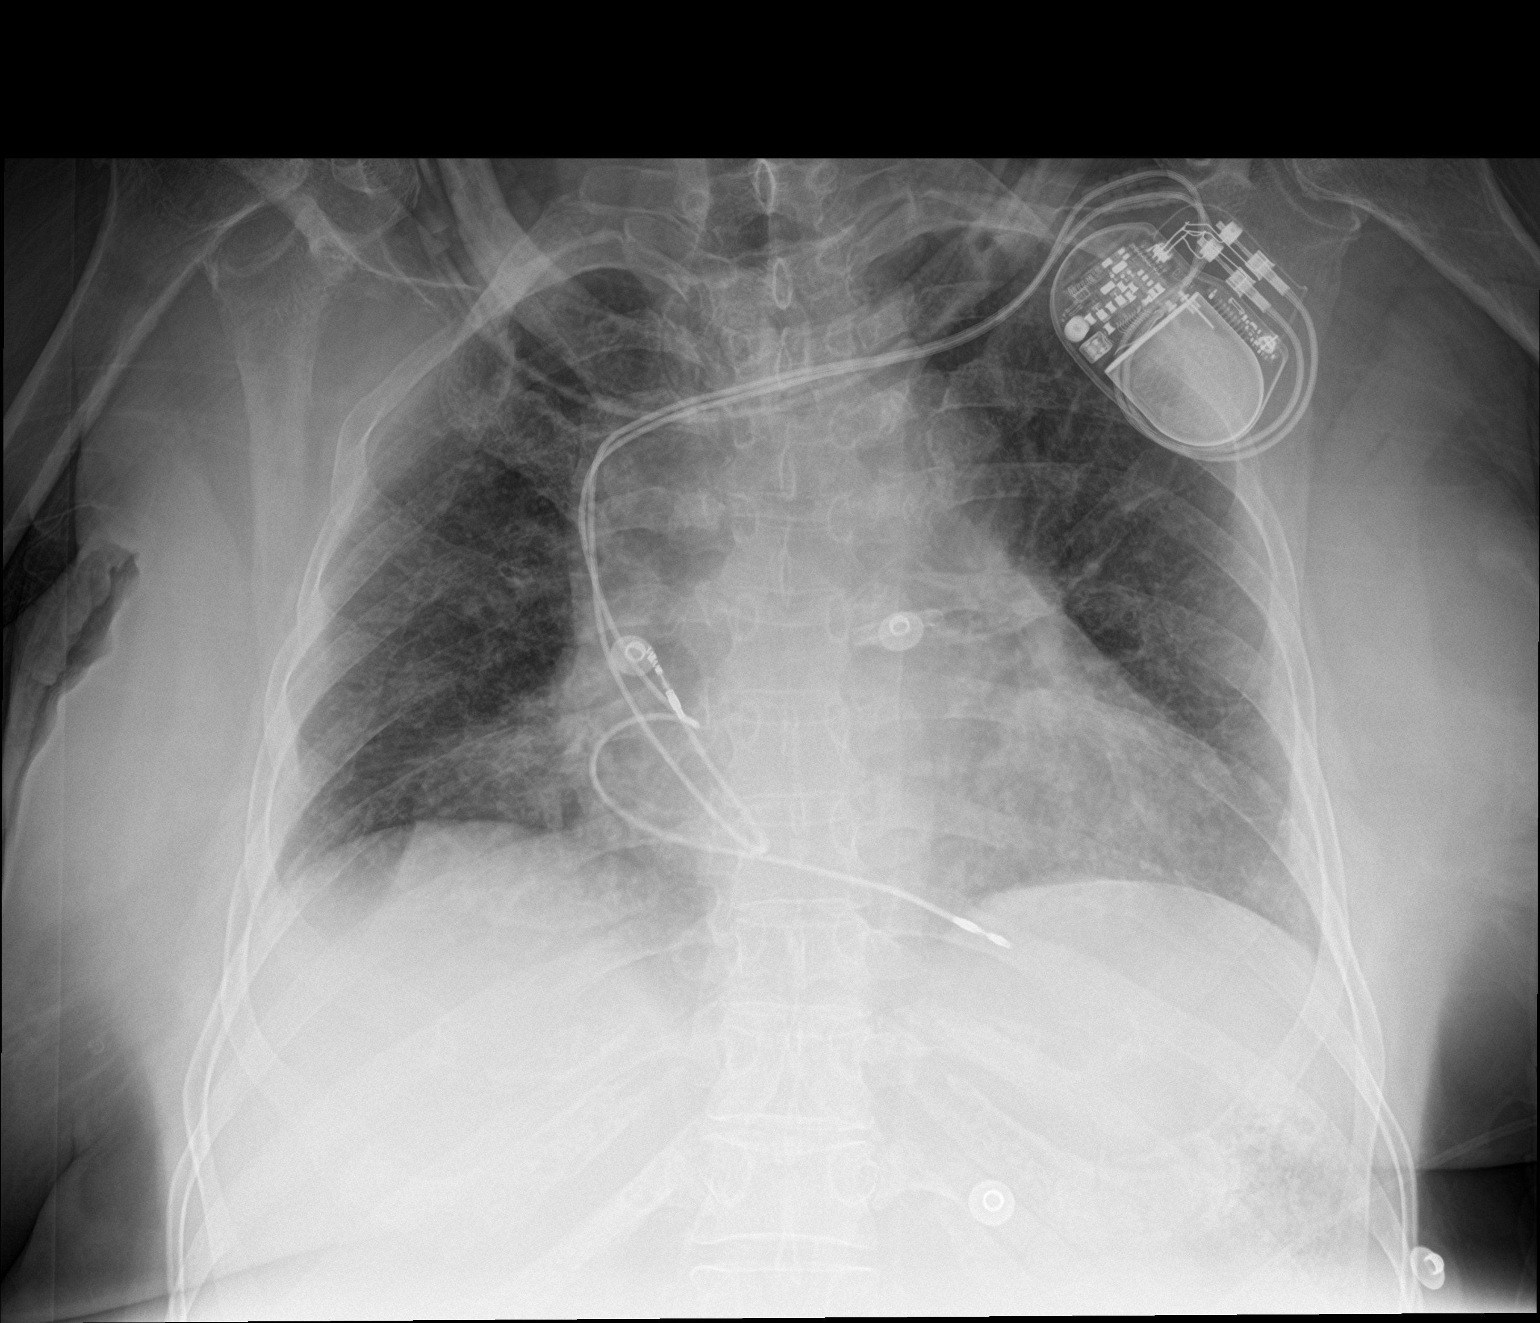

[1 of 1 positions shown; findings below may reference images not displayed]

FINDINGS: Left pacer remains in place, unchanged. Heart is borderline in size.
No confluent airspace opacities or effusions. No acute bony
abnormality.
IMPRESSION: No active disease.

## 2020-02-26 IMAGING — CT CT CERVICAL SPINE W/O CM
4 of 8 series · 11 of 33 positions shown, 12 images · non-contrast
Comparison: Head CT dated 03/10/2019.

CLINICAL DATA: 73-year-old female with trauma.

EXAM:
CT HEAD WITHOUT CONTRAST
CT CERVICAL SPINE WITHOUT CONTRAST
TECHNIQUE: Multidetector CT imaging of the head and cervical spine was
performed following the standard protocol without intravenous
contrast. Multiplanar CT image reconstructions of the cervical spine
were also generated.

[Series 3: c spine soft · axial · 0.29mm/px · z∈[+348,+396]mm · 2 of 74 slices shown]
[im 25/74  soft-tissue]
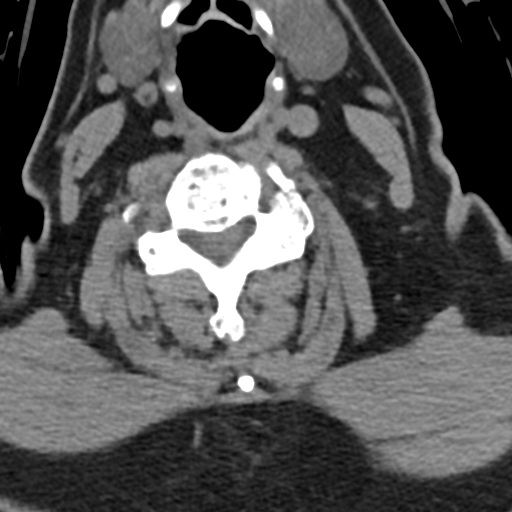
[im 49/74  soft-tissue]
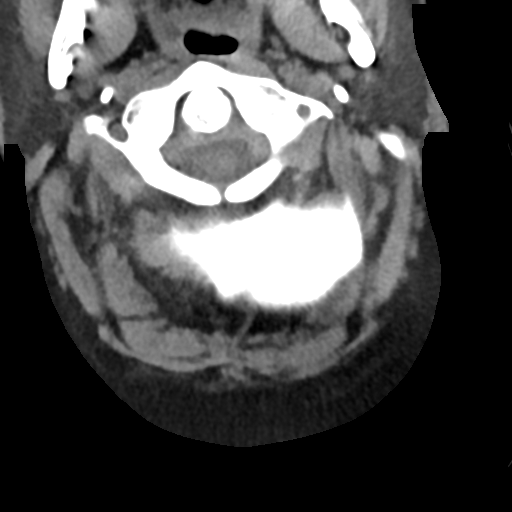

[Series 4: sagittal bone · sagittal · 0.21mm/px · 5 of 63 slices shown]
[im 11/63  bone]
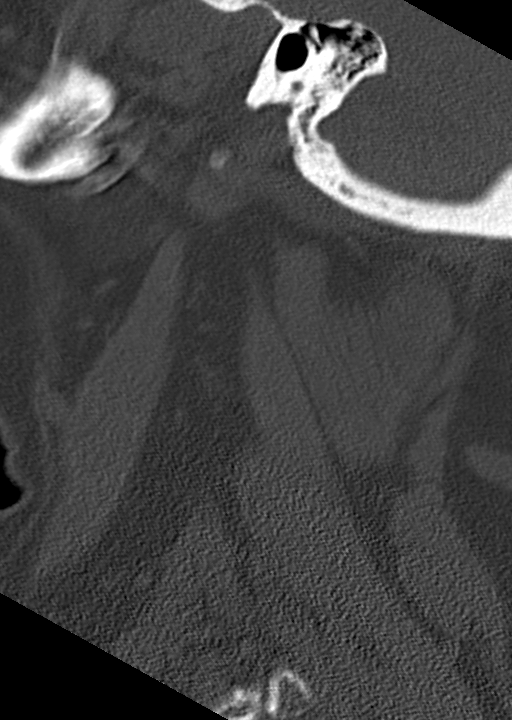
[im 21/63  bone]
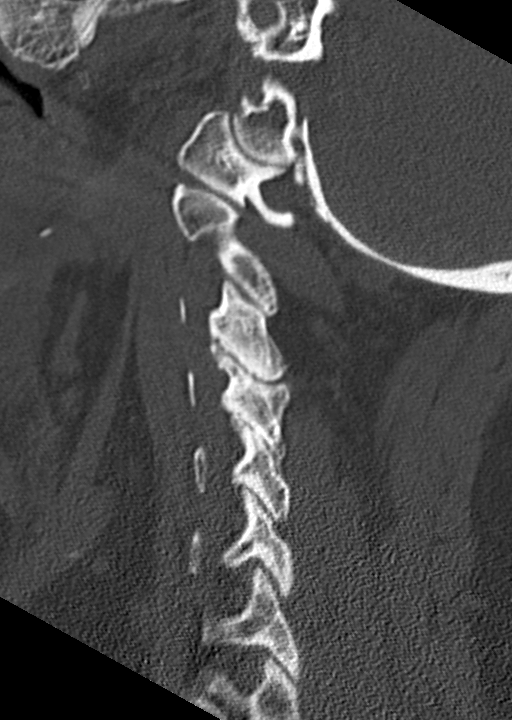
[im 32/63  bone]
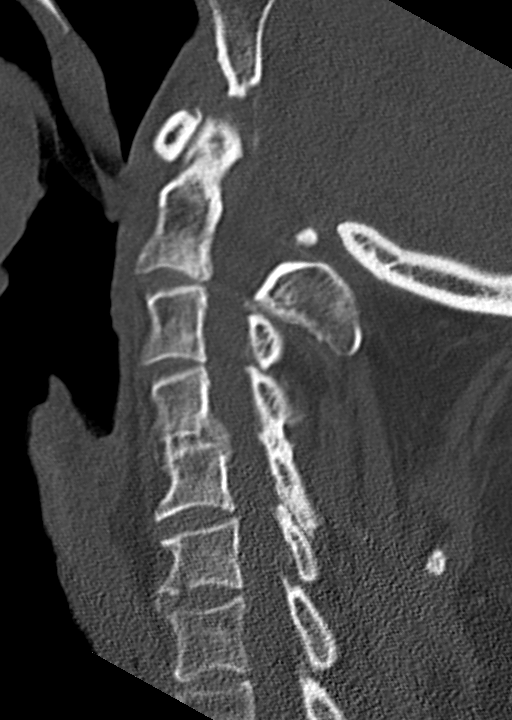
[im 42/63  bone]
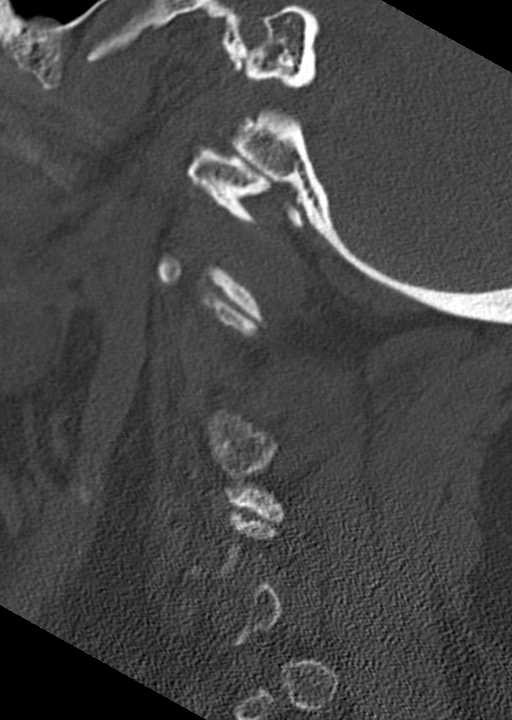
[im 52/63  bone]
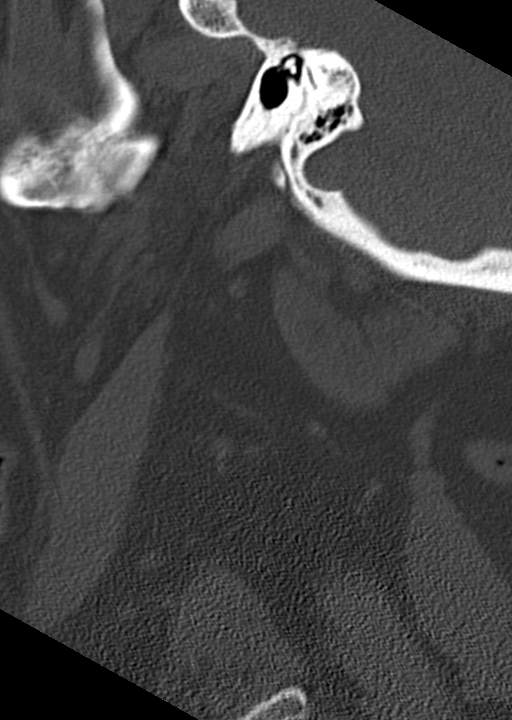

[Series 5: coronal bone · coronal · 0.25mm/px · 2 of 65 slices shown]
[im 23/65  bone]
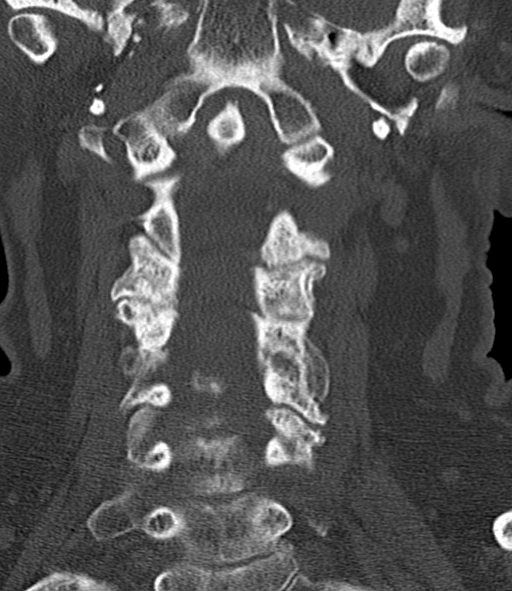
[im 45/65  bone]
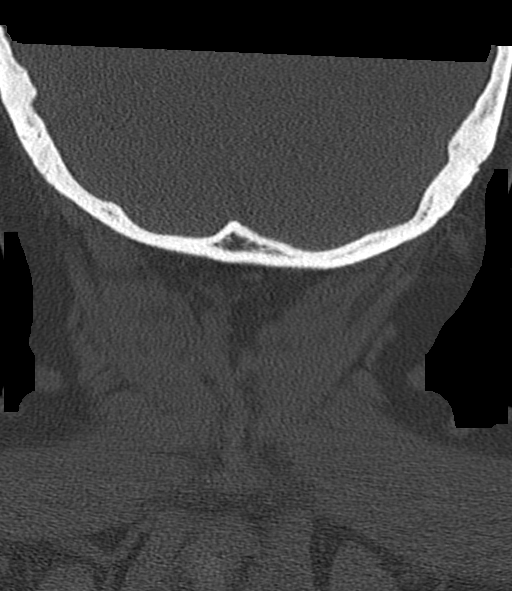

[Series 6: orthogonal bone · axial · 0.22mm/px · z∈[+321,+363]mm · 2 of 75 slices shown, 3 images]
[im 25/75  soft-tissue]
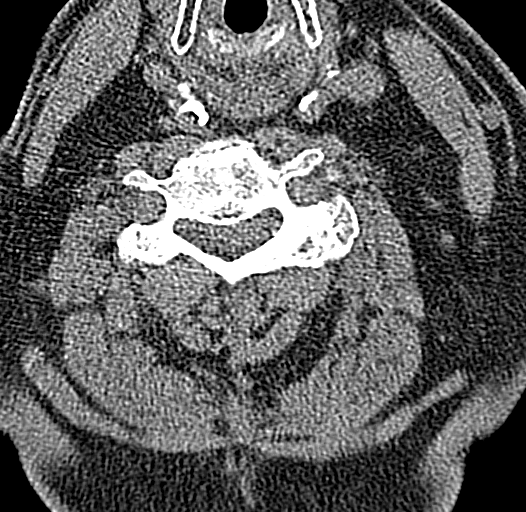
[im 25/75  bone]
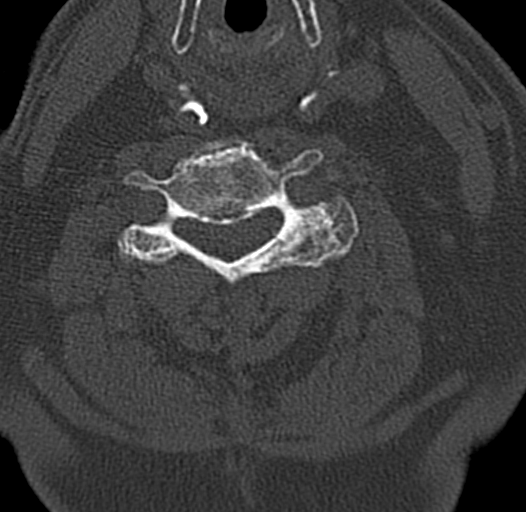
[im 50/75  bone]
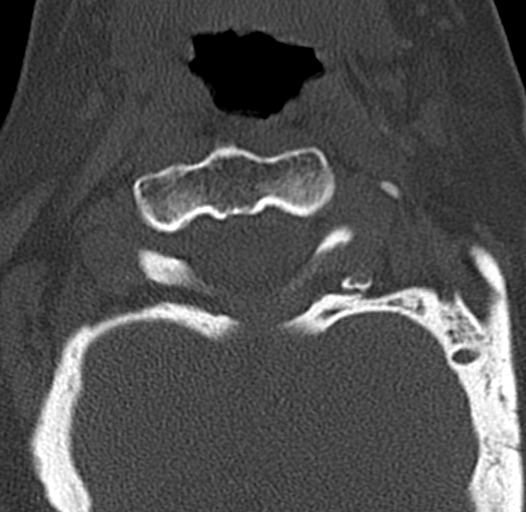

[11 of 33 positions shown; findings below may reference images not displayed]

FINDINGS: CT HEAD FINDINGS

Brain: Mild age-related atrophy and chronic microvascular ischemic
changes. There is a small right parafalcine subdural hemorrhage
measuring approximately 2 mm in thickness. No mass effect or midline
shift. Small right lentiform nucleus old lacunar infarct noted.

Vascular: No hyperdense vessel or unexpected calcification.

Skull: Normal. Negative for fracture or focal lesion.

Sinuses/Orbits: Mild mucoperiosteal thickening of paranasal sinuses.
No air-fluid level. The mastoid air cells are clear.

Other: Right posterior parietal scalp hematoma.

CT CERVICAL SPINE FINDINGS

Alignment: No acute subluxation. There is straightening of normal
cervical lordosis which may be positional or due to muscle spasm.

Skull base and vertebrae: Similar appearance of the fracture
involving the left anterior inferior endplate of C6 as seen on the
prior CT of 03/10/2019. No new fractures. The bones are osteopenic.
There is incomplete bony fusion of the posterior ring of C1.

Soft tissues and spinal canal: No prevertebral fluid or swelling. No
visible canal hematoma.

Disc levels: Multilevel degenerative changes. Partial ankylosis at
C4-C5. Multilevel facet arthropathy.

Upper chest: Negative.

Other: Bilateral carotid bulb calcified plaques.
IMPRESSION: 1. Small right parafalcine subdural hemorrhage measuring 2-3 mm in
thickness. No mass effect or midline shift.
2. Nondisplaced fracture of the left anterior inferior endplate of
C6 as seen on the prior CT. No new cervical spine fracture.

These results were called by telephone at the time of interpretation
on 03/20/2019 at [DATE] to provider FALLON JIM , who verbally
acknowledged these results.

## 2020-02-27 IMAGING — DX DG PELVIS 1-2V
2 series · 2 of 2 positions shown · non-contrast
Comparison: Prior radiograph from 04/02/2017.

CLINICAL DATA: Initial evaluation for acute pelvic pain.

EXAM:
PELVIS - 1-2 VIEW

[pelvis ap (1 of 2)]
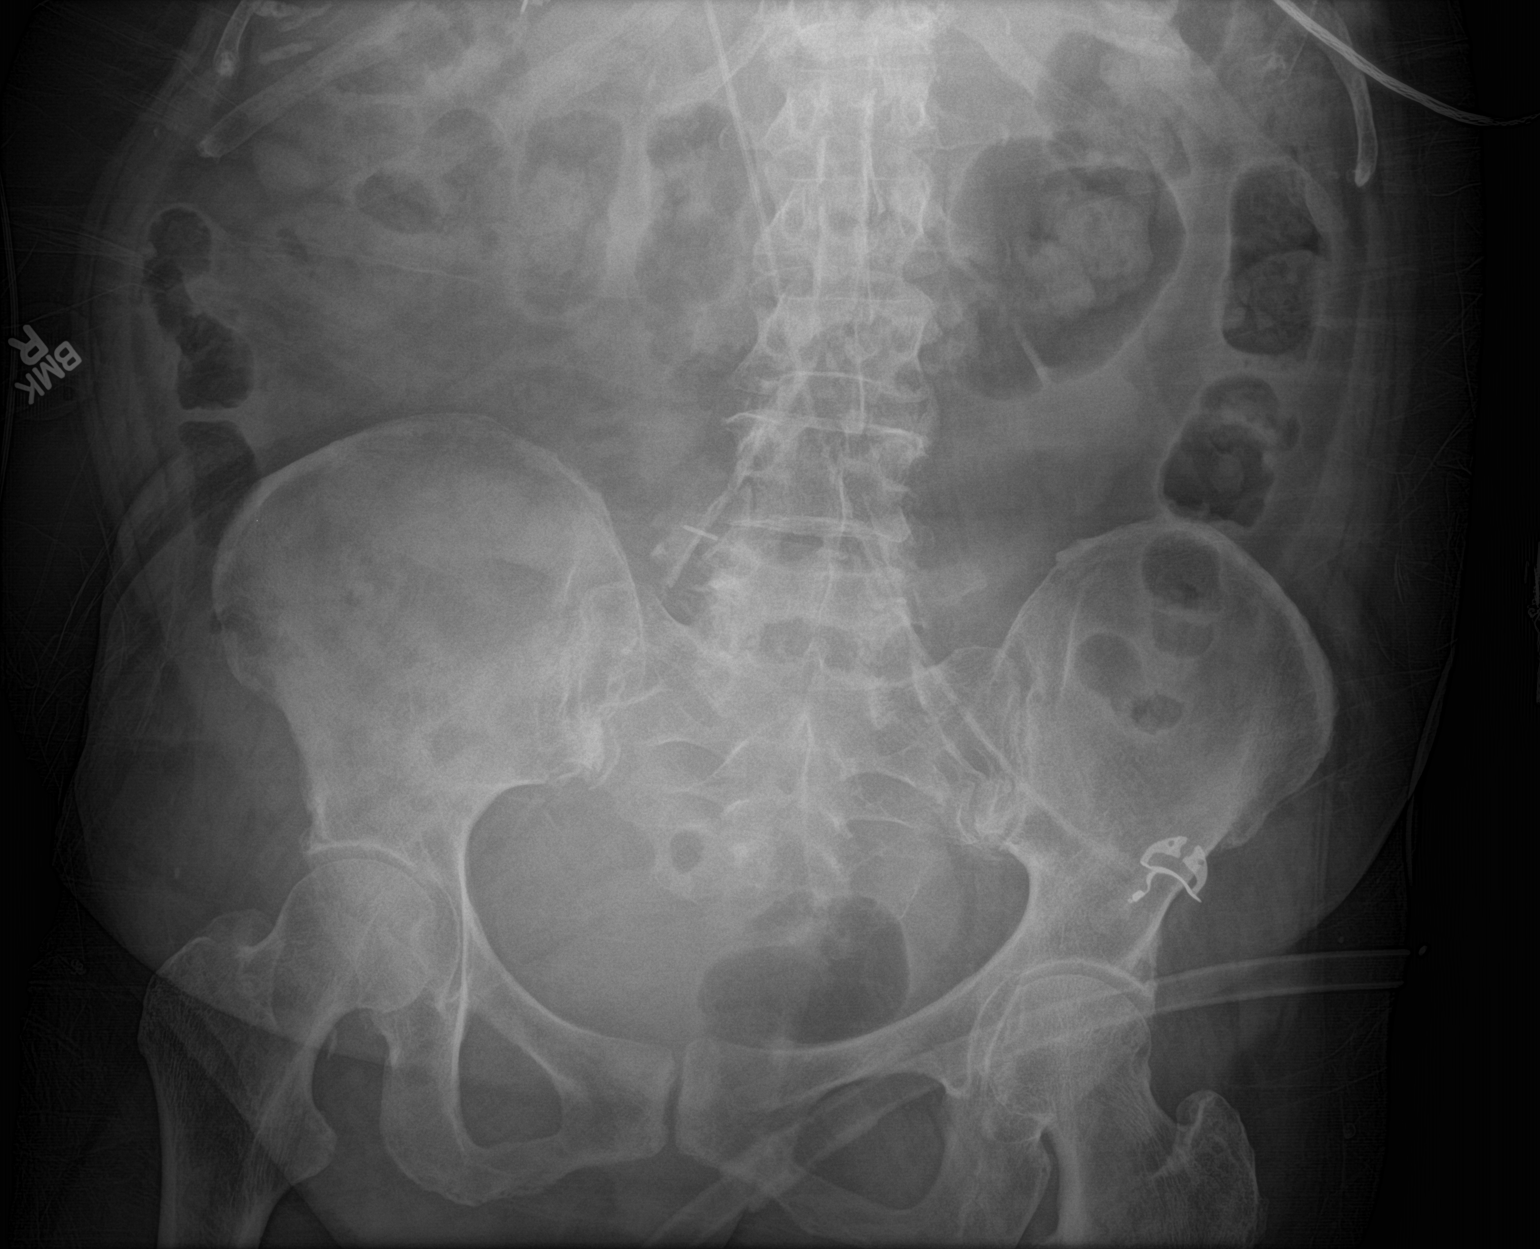

[pelvis ap (2 of 2)]
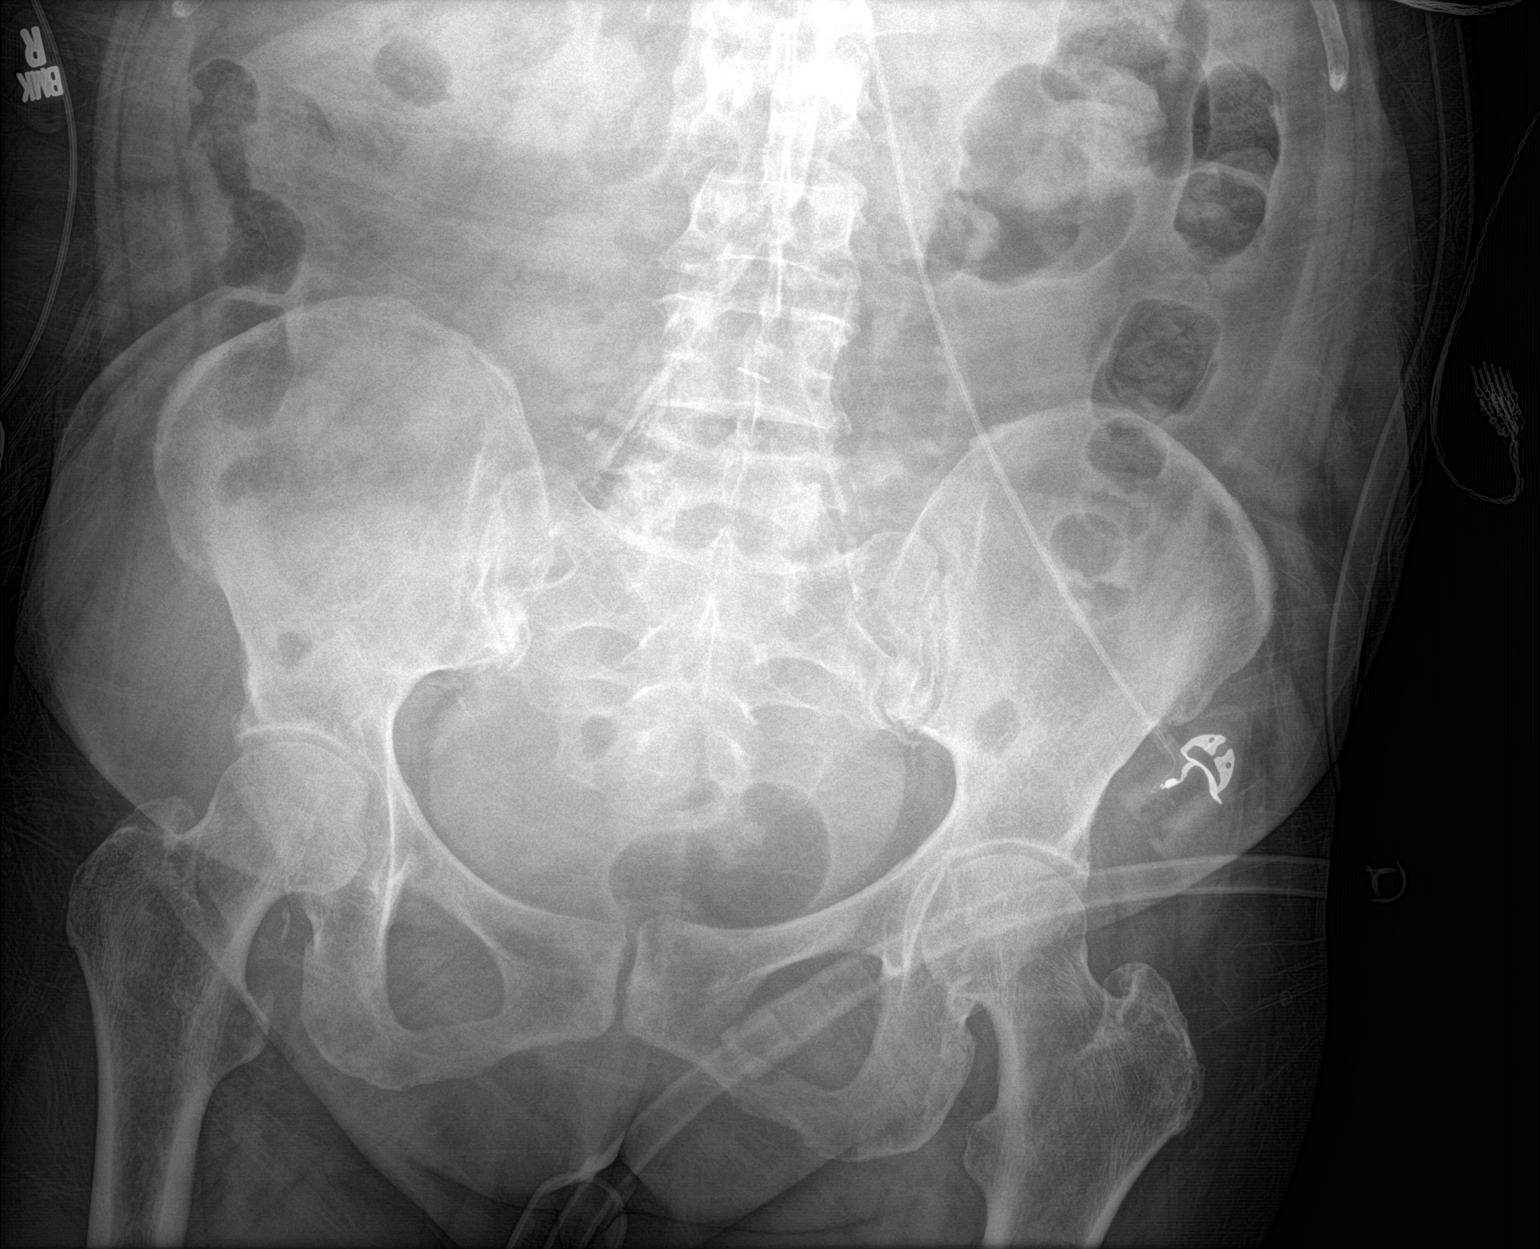

[2 of 2 positions shown; findings below may reference images not displayed]

FINDINGS: No acute fracture or dislocation. Bony pelvis intact. Mild
osteoarthritic changes noted about the SI joints bilaterally,
slightly worse on the right. Mild degenerative change noted within
the lower lumbar spine.

Visualized soft tissues demonstrate no acute finding. Visualized
bowel gas pattern is nonobstructive. Prominent aorto bi-iliac
atherosclerotic disease noted.
IMPRESSION: 1. Mild osteoarthritic changes about the bilateral sacroiliac
joints, with mild degenerative spondylosis within the lower lumbar
spine.
2. Nonobstructive bowel gas pattern. No other acute abnormality
about the pelvis.
3. Prominent aorto bi-iliac atherosclerotic disease.

## 2020-03-22 ENCOUNTER — Encounter (INDEPENDENT_AMBULATORY_CARE_PROVIDER_SITE_OTHER): Payer: Self-pay | Admitting: Vascular Surgery

## 2020-04-12 ENCOUNTER — Encounter (INDEPENDENT_AMBULATORY_CARE_PROVIDER_SITE_OTHER): Payer: Self-pay | Admitting: Vascular Surgery

## 2020-04-12 ENCOUNTER — Other Ambulatory Visit (INDEPENDENT_AMBULATORY_CARE_PROVIDER_SITE_OTHER): Payer: Self-pay | Admitting: Vascular Surgery

## 2020-04-12 ENCOUNTER — Ambulatory Visit (INDEPENDENT_AMBULATORY_CARE_PROVIDER_SITE_OTHER): Payer: Medicare Other

## 2020-04-12 ENCOUNTER — Ambulatory Visit (INDEPENDENT_AMBULATORY_CARE_PROVIDER_SITE_OTHER): Payer: Medicare Other | Admitting: Vascular Surgery

## 2020-04-12 ENCOUNTER — Other Ambulatory Visit: Payer: Self-pay

## 2020-04-12 VITALS — BP 146/73 | HR 87 | Resp 16 | Wt 149.0 lb

## 2020-04-12 DIAGNOSIS — I6529 Occlusion and stenosis of unspecified carotid artery: Secondary | ICD-10-CM

## 2020-04-12 DIAGNOSIS — I6523 Occlusion and stenosis of bilateral carotid arteries: Secondary | ICD-10-CM

## 2020-04-12 DIAGNOSIS — J449 Chronic obstructive pulmonary disease, unspecified: Secondary | ICD-10-CM

## 2020-04-12 DIAGNOSIS — I2510 Atherosclerotic heart disease of native coronary artery without angina pectoris: Secondary | ICD-10-CM | POA: Diagnosis not present

## 2020-04-12 DIAGNOSIS — E119 Type 2 diabetes mellitus without complications: Secondary | ICD-10-CM

## 2020-04-12 DIAGNOSIS — I10 Essential (primary) hypertension: Secondary | ICD-10-CM | POA: Diagnosis not present

## 2020-04-12 DIAGNOSIS — E782 Mixed hyperlipidemia: Secondary | ICD-10-CM

## 2020-04-12 DIAGNOSIS — Z794 Long term (current) use of insulin: Secondary | ICD-10-CM

## 2020-04-12 NOTE — Progress Notes (Signed)
MRN : [id number]  Melanie King is a 75 y.o. (1945/06/10) female who presents with chief complaint of No chief complaint on file. Marland Kitchen  History of Present Illness:   The patient is seen for evaluation of carotid stenosis. The carotid stenosis was identified after a duplex ultrasound.  The patient denies amaurosis fugax. There is no recent history of TIA symptoms or focal motor deficits. There is no prior documented CVA.  There is no history of migraine headaches. There is no history of seizures.  The patient is taking enteric-coated aspirin 81 mg daily.  The patient has a history of coronary artery disease, no recent episodes of angina or shortness of breath. The patient denies PAD or claudication symptoms. There is a history of hyperlipidemia which is being treated with a statin.   Patient did undergo a CT with contrast of the head and neck in May 2021 which reports a 70% left ICA stenosis.  CT of the head dated December 30, 2019 demonstrates bilateral small lacunar infarcts unchanged from previous scans no remote large left hemispheric event noted.  Duplex ultrasound of the carotid arteries obtained in the office today is reviewed by me personally it appears to be an excellent study minimal shadowing by calcification and no evidence of a greater than 80% stenosis.   No outpatient medications have been marked as taking for the 04/12/20 encounter (Appointment) with Gilda Crease, Latina Craver, MD.    Past Medical History:  Diagnosis Date  . Arthritis   . CHF (congestive heart failure) (HCC)   . COPD (chronic obstructive pulmonary disease) (HCC)   . Coronary arteriosclerosis   . DDD (degenerative disc disease), cervical   . DDD (degenerative disc disease), lumbar   . Depression   . Diabetes mellitus without complication (HCC)   . GERD (gastroesophageal reflux disease)   . Hyperlipidemia   . Hypertension   . Osteoarthritis   . Pacemaker   . Tremor     Past Surgical History:   Procedure Laterality Date  . ABDOMINAL HYSTERECTOMY    . BREAST BIOPSY Right 1994   neg cyst removed  . CHOLECYSTECTOMY    . EYE SURGERY  1964, 1966, and 1967   bilateral  . FOOT SURGERY Right    cellulitis  . PACEMAKER INSERTION    . PPM GENERATOR CHANGEOUT N/A 02/24/2020   Procedure: PPM GENERATOR CHANGEOUT;  Surgeon: Marcina Millard, MD;  Location: [facility];  Service: Cardiovascular;  Laterality: N/A;  . SPINE SURGERY     cyst removed; [facility]    Social History Social History   Tobacco Use  . Smoking status: Current Every Day Smoker    Packs/day: 1.00    Years: 50.00    Pack years: 50.00    Types: Cigarettes  . Smokeless tobacco: Never Used  Vaping Use  . Vaping Use: Every day  Substance Use Topics  . Alcohol use: No  . Drug use: No    Family History Family History  Problem Relation Age of Onset  . Breast cancer Maternal Aunt 40  . Cancer Mother        colon  . Aneurysm Father   No family history of bleeding/clotting disorders, porphyria or autoimmune disease   Allergies  Allergen Reactions  . Alprazolam Swelling  . Fentanyl Other (See Comments)    "burning and hot"  . Meperidine Itching    Other reaction(s): Other (See Comments)  . Ranitidine Hcl     Other reaction(s): Other (See  Comments)     REVIEW OF SYSTEMS (Negative unless checked)  Constitutional: [] Weight loss  [] Fever  [] Chills Cardiac: [] Chest pain   [] Chest pressure   [] Palpitations   [] Shortness of breath when laying flat   [] Shortness of breath with exertion. Vascular:  [] Pain in legs with walking   [] Pain in legs at rest  [] History of DVT   [] Phlebitis   [] Swelling in legs   [] Varicose veins   [] Non-healing ulcers Pulmonary:   [] Uses home oxygen   [] Productive cough   [] Hemoptysis   [] Wheeze  [] COPD   [] Asthma Neurologic:  [] Dizziness   [] Seizures   [] History of stroke   [] History of TIA  [] Aphasia   [] Vissual changes   [] Weakness or numbness in arm   [x] Weakness or  numbness in leg Musculoskeletal:   [] Joint swelling   [x] Joint pain   [] Low back pain Hematologic:  [] Easy bruising  [] Easy bleeding   [] Hypercoagulable state   [] Anemic Gastrointestinal:  [] Diarrhea   [] Vomiting  [] Gastroesophageal reflux/heartburn   [] Difficulty swallowing. Genitourinary:  [] Chronic kidney disease   [] Difficult urination  [] Frequent urination   [] Blood in urine Skin:  [] Rashes   [] Ulcers  Psychological:  [] History of anxiety   []  History of major depression.  Physical Examination  There were no vitals filed for this visit. There is no height or weight on file to calculate BMI. Gen: WD/WN, NAD Head: [place]/AT, No temporalis wasting.  Ear/Nose/Throat: Hearing grossly intact, nares w/o erythema or drainage, poor dentition Eyes: PER, EOMI, sclera nonicteric.  Neck: Supple, no masses.  No bruit or JVD.  Pulmonary:  Good air movement, clear to auscultation bilaterally, no use of accessory muscles.  Cardiac: RRR, normal S1, S2, no Murmurs. Vascular:  Vessel Right Left  Radial Palpable Palpable  Carotid Palpable Palpable  Gastrointestinal: soft, non-distended. No guarding/no peritoneal signs.  Musculoskeletal: M/S 5/5 throughout.  No deformity or atrophy.  Neurologic: CN 2-12 intact. Pain and light touch intact in extremities.  Symmetrical.  Speech is fluent. Motor exam as listed above. Psychiatric: Judgment intact, Mood & affect appropriate for pt's clinical situation. Dermatologic: No rashes or ulcers noted.  No changes consistent with cellulitis.  CBC Lab Results  Component Value Date   WBC 8.4 09/17/2019   HGB 9.4 (L) 09/17/2019   HCT 29.0 (L) 09/17/2019   MCV 91.2 09/17/2019   PLT 369 09/17/2019    BMET    Component Value Date/Time   NA 135 09/17/2019 0847   NA 143 04/02/2017 1508   NA 136 06/20/2013 0938   K 4.1 09/17/2019 0847   K 4.7 06/20/2013 0938   CL 99 09/17/2019 0847   CL 102 06/20/2013 0938   CO2 25 09/17/2019 0847   CO2 33 (H) 06/20/2013 0938    GLUCOSE 224 (H) 09/17/2019 0847   GLUCOSE 115 (H) 06/20/2013 0938   BUN 18 09/17/2019 0847   BUN 18 04/02/2017 1508   BUN 22 (H) 06/20/2013 0938   CREATININE 1.20 (H) 09/17/2019 0847   CREATININE 0.72 06/20/2013 0938   CALCIUM 9.3 09/17/2019 0847   CALCIUM 9.1 06/20/2013 0938   GFRNONAA 45 (L) 09/17/2019 0847   GFRNONAA >60 06/20/2013 0938   GFRAA 52 (L) 09/17/2019 0847   GFRAA >60 06/20/2013 0938   CrCl cannot be calculated (Patient's most recent lab result is older than the maximum 21 days allowed.).  COAG Lab Results  Component Value Date   INR 1.04 05/10/2017    Radiology No results found.   Assessment/Plan 1. Bilateral  carotid artery stenosis Recommend:  Given the patient's asymptomatic subcritical stenosis no further invasive testing or surgery at this time.  The CT scan from last August 05, 2022 does not seem to correlate well with the duplex ultrasound.  Given the findings on the CT of the head from October as well as the length of time that has passed since 08/05/2022 and the quality of the ultrasound obtained today I do not recommend intervention at this time.  I will continue to follow and should she present with a more well-defined symptom attributable to the left ICA and left hemisphere then certainly carotid stenting should be discussed.  Duplex ultrasound shows 40% stenosis bilaterally.  Continue antiplatelet therapy as prescribed Continue management of CAD, HTN and Hyperlipidemia Healthy heart diet,  encouraged exercise at least 4 times per week Follow up in 6 months with duplex ultrasound and physical exam  - VAS US CAROTID; Future  2. Coronary artery disease involving native heart without angina pectoris, unspecified vessel or lesion type Continue cardiac and antihypertensive medications as already ordered and reviewed, no changes at this time.  Continue statin as ordered and reviewed, no changes at this time  Nitrates PRN for chest pain   3. Primary  hypertension Continue antihypertensive medications as already ordered, these medications have been reviewed and there are no changes at this time.   4. Chronic obstructive pulmonary disease, unspecified COPD type (HCC) Continue pulmonary medications and aerosols as already ordered, these medications have been reviewed and there are no changes at this time.    5. Type 2 diabetes mellitus without complication, with long-term current use of insulin (HCC) Continue hypoglycemic medications as already ordered, these medications have been reviewed and there are no changes at this time.  Hgb A1C to be monitored as already arranged by primary service   6. Mixed hyperlipidemia Continue statin as ordered and reviewed, no changes at this time     Levora Dredge, MD  04/12/2020 2:50 PM

## 2020-04-30 ENCOUNTER — Ambulatory Visit: Payer: Medicare Other | Admitting: Family

## 2020-04-30 ENCOUNTER — Telehealth: Payer: Self-pay | Admitting: Family

## 2020-04-30 NOTE — Telephone Encounter (Signed)
Patient did not show for her [facility] appointment on 04/30/20. Will attempt to reschedule.

## 2020-05-02 ENCOUNTER — Other Ambulatory Visit: Payer: Self-pay

## 2020-05-02 ENCOUNTER — Emergency Department: Payer: Medicare Other

## 2020-05-02 ENCOUNTER — Encounter: Payer: Self-pay | Admitting: Emergency Medicine

## 2020-05-02 ENCOUNTER — Emergency Department
Admission: EM | Admit: 2020-05-02 | Discharge: 2020-05-02 | Disposition: A | Discharge status: Home / Self Care | Payer: Medicare Other | Attending: Emergency Medicine | Admitting: Emergency Medicine

## 2020-05-02 DIAGNOSIS — Y92129 Unspecified place in nursing home as the place of occurrence of the external cause: Secondary | ICD-10-CM | POA: Diagnosis not present

## 2020-05-02 DIAGNOSIS — Z7984 Long term (current) use of oral hypoglycemic drugs: Secondary | ICD-10-CM | POA: Diagnosis not present

## 2020-05-02 DIAGNOSIS — Z79899 Other long term (current) drug therapy: Secondary | ICD-10-CM | POA: Diagnosis not present

## 2020-05-02 DIAGNOSIS — I2583 Coronary atherosclerosis due to lipid rich plaque: Secondary | ICD-10-CM | POA: Diagnosis not present

## 2020-05-02 DIAGNOSIS — I25119 Atherosclerotic heart disease of native coronary artery with unspecified angina pectoris: Secondary | ICD-10-CM | POA: Diagnosis not present

## 2020-05-02 DIAGNOSIS — Z950 Presence of cardiac pacemaker: Secondary | ICD-10-CM | POA: Diagnosis not present

## 2020-05-02 DIAGNOSIS — W01198A Fall on same level from slipping, tripping and stumbling with subsequent striking against other object, initial encounter: Secondary | ICD-10-CM | POA: Diagnosis not present

## 2020-05-02 DIAGNOSIS — I5032 Chronic diastolic (congestive) heart failure: Secondary | ICD-10-CM | POA: Insufficient documentation

## 2020-05-02 DIAGNOSIS — E1169 Type 2 diabetes mellitus with other specified complication: Secondary | ICD-10-CM | POA: Insufficient documentation

## 2020-05-02 DIAGNOSIS — J449 Chronic obstructive pulmonary disease, unspecified: Secondary | ICD-10-CM | POA: Diagnosis not present

## 2020-05-02 DIAGNOSIS — E039 Hypothyroidism, unspecified: Secondary | ICD-10-CM | POA: Insufficient documentation

## 2020-05-02 DIAGNOSIS — S0101XA Laceration without foreign body of scalp, initial encounter: Secondary | ICD-10-CM | POA: Insufficient documentation

## 2020-05-02 DIAGNOSIS — E785 Hyperlipidemia, unspecified: Secondary | ICD-10-CM | POA: Insufficient documentation

## 2020-05-02 DIAGNOSIS — F17210 Nicotine dependence, cigarettes, uncomplicated: Secondary | ICD-10-CM | POA: Insufficient documentation

## 2020-05-02 DIAGNOSIS — I110 Hypertensive heart disease with heart failure: Secondary | ICD-10-CM | POA: Diagnosis not present

## 2020-05-02 DIAGNOSIS — S0990XA Unspecified injury of head, initial encounter: Secondary | ICD-10-CM | POA: Diagnosis present

## 2020-05-02 DIAGNOSIS — M542 Cervicalgia: Secondary | ICD-10-CM | POA: Diagnosis not present

## 2020-05-02 NOTE — ED Provider Notes (Signed)
Medical screening examination/treatment/procedure(s) were conducted as a shared visit with non-physician practitioner(s) and myself.  I personally evaluated the patient during the encounter.    Patient sitting comfortably in wheelchair.  Reports that she is tripped today she did not have her cane with her.  Reports a small area of tenderness and a small cut over her left upper scalp.  On exam she is awake alert well oriented has a small proximately 1/2 cm laceration without active bleeding over the left parietal scalp.  No large or surrounding hematoma.  Alert without distress.  CT Head Wo Contrast  Result Date: 05/02/2020 CLINICAL DATA:  Pt states was going to sit down and missed her seat, pt states fell back and hit her head, pt with small lac to L posterior head, bleeding controlled, denies LOC, denies blood thinner use at this time. EXAM: CT HEAD WITHOUT CONTRAST CT CERVICAL SPINE WITHOUT CONTRAST TECHNIQUE: Multidetector CT imaging of the head and cervical spine was performed following the standard protocol without intravenous contrast. Multiplanar CT image reconstructions of the cervical spine were also generated. COMPARISON:  CT head and C-spine 12/30/2019 FINDINGS: CT HEAD FINDINGS Brain: Cerebral ventricle sizes are concordant with the degree of cerebral volume loss. Patchy and confluent areas of decreased attenuation are noted throughout the deep and periventricular white matter of the cerebral hemispheres bilaterally, compatible with chronic microvascular ischemic disease. Bilateral basal ganglia old lacunar infarctions. No evidence of large-territorial acute infarction. No parenchymal hemorrhage. No mass lesion. No extra-axial collection. No mass effect or midline shift. No hydrocephalus. Basilar cisterns are patent. Vascular: No hyperdense vessel. Atherosclerotic calcifications are present within the cavernous internal carotid and vertebral arteries. Skull: No acute fracture or focal lesion.  Sinuses/Orbits: Paranasal sinuses and mastoid air cells are clear. The orbits are unremarkable. Other: None. CT CERVICAL SPINE FINDINGS Alignment: Similar-appearing reversal of the normal cervical lordosis likely due to degenerative changes and positioning. Stable grade 1 anterolisthesis of C4 on C5. Skull base and vertebrae: Similar-appearing multilevel at least moderate degenerative changes of the spine with osteophyte formation, facet arthropathy, uncovertebral arthropathy. At least moderate multilevel osseous neural foraminal stenosis. No acute fracture. No aggressive appearing focal osseous lesion or focal pathologic process. Soft tissues and spinal canal: No prevertebral fluid or swelling. No visible canal hematoma. Upper chest: Unremarkable. Other: Partially visualized left pacemaker. IMPRESSION: 1. No acute intracranial abnormality. 2. No acute displaced fracture or traumatic listhesis of the cervical spine. Electronically Signed   By: Tish Frederickson M.D.   On: 05/02/2020 16:28   CT Cervical Spine Wo Contrast  Result Date: 05/02/2020 CLINICAL DATA:  Pt states was going to sit down and missed her seat, pt states fell back and hit her head, pt with small lac to L posterior head, bleeding controlled, denies LOC, denies blood thinner use at this time. EXAM: CT HEAD WITHOUT CONTRAST CT CERVICAL SPINE WITHOUT CONTRAST TECHNIQUE: Multidetector CT imaging of the head and cervical spine was performed following the standard protocol without intravenous contrast. Multiplanar CT image reconstructions of the cervical spine were also generated. COMPARISON:  CT head and C-spine 12/30/2019 FINDINGS: CT HEAD FINDINGS Brain: Cerebral ventricle sizes are concordant with the degree of cerebral volume loss. Patchy and confluent areas of decreased attenuation are noted throughout the deep and periventricular white matter of the cerebral hemispheres bilaterally, compatible with chronic microvascular ischemic disease.  Bilateral basal ganglia old lacunar infarctions. No evidence of large-territorial acute infarction. No parenchymal hemorrhage. No mass lesion. No extra-axial collection. No mass effect  or midline shift. No hydrocephalus. Basilar cisterns are patent. Vascular: No hyperdense vessel. Atherosclerotic calcifications are present within the cavernous internal carotid and vertebral arteries. Skull: No acute fracture or focal lesion. Sinuses/Orbits: Paranasal sinuses and mastoid air cells are clear. The orbits are unremarkable. Other: None. CT CERVICAL SPINE FINDINGS Alignment: Similar-appearing reversal of the normal cervical lordosis likely due to degenerative changes and positioning. Stable grade 1 anterolisthesis of C4 on C5. Skull base and vertebrae: Similar-appearing multilevel at least moderate degenerative changes of the spine with osteophyte formation, facet arthropathy, uncovertebral arthropathy. At least moderate multilevel osseous neural foraminal stenosis. No acute fracture. No aggressive appearing focal osseous lesion or focal pathologic process. Soft tissues and spinal canal: No prevertebral fluid or swelling. No visible canal hematoma. Upper chest: Unremarkable. Other: Partially visualized left pacemaker. IMPRESSION: 1. No acute intracranial abnormality. 2. No acute displaced fracture or traumatic listhesis of the cervical spine. Electronically Signed   By: Tish Frederickson M.D.   On: 05/02/2020 16:28      Sharyn Creamer, MD 05/02/20 [id number]

## 2020-05-02 NOTE — ED Triage Notes (Signed)
Pt states was going to sit down and missed her seat, pt states fell back and hit her head, pt with small lac to L posterior head, bleeding controlled, denies LOC, denies blood thinner use at this time.

## 2020-05-02 NOTE — Discharge Instructions (Signed)
You had 3 staples placed today in your scalp.  Please have these removed in 5 to 7 days.  Follow-up with primary care.  Return to the ER for any worsening.

## 2020-05-02 NOTE — ED Provider Notes (Signed)
[facility] Emergency Department Provider Note   ____________________________________________   Event Date/Time   First MD Initiated Contact with Patient 05/02/20 1617     (approximate)  I have reviewed the triage vital signs and the nursing notes.   HISTORY  Chief Complaint Fall  HPI Melanie King is a 75 y.o. female who presents to the emergency department for evaluation of head injury.  Patient sustained a fall at her assisted living facility.  Patient states that she was trying to sit down and missed her seat, fell and hit the back of her head.  She denies loss of consciousness, denies blurred vision, diplopia, nausea or vomiting.  Does not take any blood thinners.  She reports associated neck pain.  She denies any other complaints or weakness.         Past Medical History:  Diagnosis Date  . Arthritis   . CHF (congestive heart failure) (HCC)   . COPD (chronic obstructive pulmonary disease) (HCC)   . Coronary arteriosclerosis   . DDD (degenerative disc disease), cervical   . DDD (degenerative disc disease), lumbar   . Depression   . Diabetes mellitus without complication (HCC)   . GERD (gastroesophageal reflux disease)   . Hyperlipidemia   . Hypertension   . Osteoarthritis   . Pacemaker   . Tremor     Patient Active Problem List   Diagnosis Date Noted  . Carotid stenosis 04/12/2020  . Dysphagia 07/19/2019  . Acute respiratory failure due to COVID-19 (HCC) 07/16/2019  . Respiratory distress   . Acute on chronic congestive heart failure (HCC)   . Acute respiratory failure (HCC) 07/15/2019  . Frequent falls 05/26/2019  . Generalized weakness 05/26/2019  . AKI (acute kidney injury) (HCC) 03/21/2019  . Subdural hematoma (HCC) 03/20/2019  . Cardiac pacemaker 03/17/2019  . Hypothyroidism 03/10/2019  . Depression   . Chronic diastolic CHF (congestive heart failure) (HCC)   . Acute metabolic encephalopathy   . Fall   . Tobacco abuse    . C6 cervical fracture (HCC)   . Left hand pain 04/01/2018  . Numbness and tingling in left hand 04/01/2018  . Left hand weakness 03/05/2018  . Confusion 05/12/2017  . Seizure (HCC) 05/10/2017  . Elevated sedimentation rate 04/04/2017  . Elevated C-reactive protein (CRP) 04/04/2017  . Chest pain, unspecified 04/02/2017  . Bilateral lower extremity pain (Secondary Area of Pain) (R>L) 04/02/2017  . Chronic hip pain, bilateral  (Secondary Area of Pain) (R>L) 04/02/2017  . Chronic bilateral low back pain with bilateral sciatica [facility]) (R>L) 04/02/2017  . Chronic pain syndrome 04/02/2017  . Disorder of bone, unspecified 04/02/2017  . Other long term (current) drug therapy 04/02/2017  . Other specified health status 04/02/2017  . Long term current use of opiate analgesic 04/02/2017  . Hip pain, bilateral 12/06/2016  . Tremor 05/27/2016  . Trigger finger of left hand 02/16/2016  . Osteoarthritis of both hands 02/16/2016  . Angina pectoris (HCC) 01/17/2016  . COPD (chronic obstructive pulmonary disease) (HCC) 01/17/2016  . CAD (coronary artery disease) 01/17/2016  . HLD (hyperlipidemia) 01/17/2016  . Hypertension 01/17/2016  . Sick sinus syndrome (HCC) 01/17/2016  . Chronic pain 01/17/2016  . Personal history of tobacco use, presenting hazards to health 11/16/2015  . Type 2 diabetes mellitus without complication, with long-term current use of insulin (HCC) 05/12/2013    Past Surgical History:  Procedure Laterality Date  . ABDOMINAL HYSTERECTOMY    . BREAST BIOPSY  Right 1994   neg cyst removed  . CHOLECYSTECTOMY    . EYE SURGERY  1964, 1966, and 1967   bilateral  . FOOT SURGERY Right    cellulitis  . PACEMAKER INSERTION    . PPM GENERATOR CHANGEOUT N/A 02/24/2020   Procedure: PPM GENERATOR CHANGEOUT;  Surgeon: Marcina Millard, MD;  Location: [facility] INVASIVE CV LAB;  Service: Cardiovascular;  Laterality: N/A;  . SPINE SURGERY     cyst removed; [facility]     Prior to Admission medications   Medication Sig Start Date End Date Taking? Authorizing Provider  acetaminophen (TYLENOL) 500 MG tablet Take 1,000 mg by mouth every 6 (six) hours as needed for mild pain or moderate pain.    [provider]  amLODipine (NORVASC) 10 MG tablet TAKE 1 TABLET BY MOUTH EVERY DAY Patient taking differently: Take 10 mg by mouth daily. 08/28/16   Gabriel Cirri, NP  aspirin EC 81 MG tablet Take 81 mg by mouth daily.    [provider]  benztropine (COGENTIN) 0.5 MG tablet Take 0.5 mg by mouth 2 (two) times daily.    [provider]  brexpiprazole (REXULTI) 2 MG TABS tablet Take 1 mg by mouth at bedtime.    [provider]  calcitRIOL (ROCALTROL) 0.25 MCG capsule Take 0.25 mcg by mouth daily.    [provider]  cephALEXin (KEFLEX) 250 MG capsule Take 2 capsules (500 mg total) by mouth 2 (two) times daily. Patient not taking: Reported on 04/12/2020 02/24/20   Marcina Millard, MD  cholecalciferol (VITAMIN D3) 25 MCG (1000 UNIT) tablet Take 400 Units by mouth daily.     [provider]  citalopram (CELEXA) 20 MG tablet Take 20 mg by mouth daily.    [provider]  fenofibrate (TRICOR) 48 MG tablet Take 48 mg by mouth daily.    [provider]  fluticasone (FLONASE) 50 MCG/ACT nasal spray Place 2 sprays into both nostrils daily.    [provider]  furosemide (LASIX) 20 MG tablet Take 20 mg by mouth daily.    [provider]  gabapentin (NEURONTIN) 300 MG capsule Take 300 mg by mouth daily.    [provider]  hydrocortisone cream 1 % 1 application 2 (two) times daily. Rectally Patient not taking: No sig reported    [provider]  levothyroxine (SYNTHROID) 125 MCG tablet Take 1 tablet (125 mcg total) by mouth daily before breakfast. Patient taking differently: Take 150 mcg by mouth daily before breakfast. 05/28/19   Dhungel, Nishant, MD  losartan (COZAAR) 50  MG tablet Take 50 mg by mouth daily.    [provider]  magnesium oxide (MAG-OX) 400 MG tablet Take 400 mg by mouth daily.    [provider]  metFORMIN (GLUCOPHAGE) 1000 MG tablet Take 0.5 tablets (500 mg total) by mouth 2 (two) times daily with a meal. 05/28/19   Dhungel, Nishant, MD  metoprolol tartrate (LOPRESSOR) 25 MG tablet Take 25 mg by mouth 2 (two) times daily.    [provider]  montelukast (SINGULAIR) 10 MG tablet Take 10 mg by mouth daily.    [provider]  nitroGLYCERIN (NITRODUR - DOSED IN MG/24 HR) 0.2 mg/hr patch Place 0.2 mg onto the skin daily. leave patch on 12-14 hours, then remove for 10-12 hours prior to applying the next patch     [provider]  pantoprazole (PROTONIX) 40 MG tablet Take 40 mg by mouth daily.    [provider]  primidone (MYSOLINE) 50 MG tablet Take 50 mg by mouth 2 (two) times daily.    [provider]  senna-docusate (SENOKOT-S) 8.6-50 MG tablet Take 2 tablets by mouth daily.    [provider]  sitaGLIPtin (JANUVIA) 50 MG tablet Take 50 mg by mouth daily.    [provider]  traZODone (DESYREL) 50 MG tablet Take by mouth at bedtime as needed for sleep.    [provider]  vitamin B-12 (CYANOCOBALAMIN) 1000 MCG tablet Take 2,000 mcg by mouth daily.    [provider]    Allergies Alprazolam, Fentanyl, Meperidine, Meperidine hcl, and Ranitidine hcl  Family History  Problem Relation Age of Onset  . Breast cancer Maternal Aunt 40  . Cancer Mother        colon  . Aneurysm Father     Social History Social History   Tobacco Use  . Smoking status: Current Every Day Smoker    Packs/day: 1.00    Years: 50.00    Pack years: 50.00    Types: Cigarettes  . Smokeless tobacco: Never Used  Vaping Use  . Vaping Use: Every day  Substance Use Topics  . Alcohol use: No  . Drug use: No    Review of Systems Constitutional: No fever/chills Eyes: No  visual changes. ENT: No sore throat. Cardiovascular: Denies chest pain. Respiratory: Denies shortness of breath. Gastrointestinal: No abdominal pain.  No nausea, no vomiting.  No diarrhea.  No constipation. Genitourinary: Negative for dysuria. Musculoskeletal: + Neck pain, negative for back pain. Skin: + Scalp laceration, negative for rash. Neurological: + headaches, negative for focal weakness or numbness.   ____________________________________________   PHYSICAL EXAM:  VITAL SIGNS: ED Triage Vitals  Enc Vitals Group     BP 05/02/20 1553 (!) 180/74     Pulse Rate 05/02/20 1553 71     Resp 05/02/20 1553 20     Temp 05/02/20 1553 97.8 F (36.6 C)     Temp Source 05/02/20 1553 Oral     SpO2 05/02/20 1553 100 %     Weight 05/02/20 1554 145 lb (65.8 kg)     Height 05/02/20 1554 5\' 1"  (1.549 m)     Head Circumference --      Peak Flow --      Pain Score 05/02/20 1554 5     Pain Loc --      Pain Edu? --      Excl. in GC? --    Constitutional: Alert and oriented. Well appearing and in no acute distress. Eyes: Conjunctivae are normal. PERRL. EOMI. Head: There is a 1.5 cm laceration to the left superior portion of the scalp with bleeding controlled.  Otherwise atraumatic. Mouth/Throat: Mucous membranes are moist.  Neck: No stridor.  Tenderness noted palpation of the midline and paraspinals of the cervical spine.  Patient has full range of motion. Cardiovascular: Normal rate, regular rhythm. Grossly normal heart sounds.  Good peripheral circulation. Respiratory: Normal respiratory effort.  No retractions. Lungs CTAB. Musculoskeletal: No lower extremity tenderness nor edema.  No joint effusions. Neurologic:  Normal speech and language. No gross focal neurologic deficits are appreciated.  Skin:  Skin is warm, dry and intact except for scalp laceration as described above. No rash noted. Psychiatric: Mood and affect are normal. Speech and behavior are  normal.  ____________________________________________  RADIOLOGY  Official radiology report(s): CT Head Wo Contrast  Result Date: 05/02/2020 CLINICAL DATA:  Pt states was going to sit down and missed her seat,  pt states fell back and hit her head, pt with small lac to L posterior head, bleeding controlled, denies LOC, denies blood thinner use at this time. EXAM: CT HEAD WITHOUT CONTRAST CT CERVICAL SPINE WITHOUT CONTRAST TECHNIQUE: Multidetector CT imaging of the head and cervical spine was performed following the standard protocol without intravenous contrast. Multiplanar CT image reconstructions of the cervical spine were also generated. COMPARISON:  CT head and C-spine 12/30/2019 FINDINGS: CT HEAD FINDINGS Brain: Cerebral ventricle sizes are concordant with the degree of cerebral volume loss. Patchy and confluent areas of decreased attenuation are noted throughout the deep and periventricular white matter of the cerebral hemispheres bilaterally, compatible with chronic microvascular ischemic disease. Bilateral basal ganglia old lacunar infarctions. No evidence of large-territorial acute infarction. No parenchymal hemorrhage. No mass lesion. No extra-axial collection. No mass effect or midline shift. No hydrocephalus. Basilar cisterns are patent. Vascular: No hyperdense vessel. Atherosclerotic calcifications are present within the cavernous internal carotid and vertebral arteries. Skull: No acute fracture or focal lesion. Sinuses/Orbits: Paranasal sinuses and mastoid air cells are clear. The orbits are unremarkable. Other: None. CT CERVICAL SPINE FINDINGS Alignment: Similar-appearing reversal of the normal cervical lordosis likely due to degenerative changes and positioning. Stable grade 1 anterolisthesis of C4 on C5. Skull base and vertebrae: Similar-appearing multilevel at least moderate degenerative changes of the spine with osteophyte formation, facet arthropathy, uncovertebral arthropathy. At least  moderate multilevel osseous neural foraminal stenosis. No acute fracture. No aggressive appearing focal osseous lesion or focal pathologic process. Soft tissues and spinal canal: No prevertebral fluid or swelling. No visible canal hematoma. Upper chest: Unremarkable. Other: Partially visualized left pacemaker. IMPRESSION: 1. No acute intracranial abnormality. 2. No acute displaced fracture or traumatic listhesis of the cervical spine. Electronically Signed   By: Tish FredericksonMorgane  Naveau M.D.   On: 05/02/2020 16:28   CT Cervical Spine Wo Contrast  Result Date: 05/02/2020 CLINICAL DATA:  Pt states was going to sit down and missed her seat, pt states fell back and hit her head, pt with small lac to L posterior head, bleeding controlled, denies LOC, denies blood thinner use at this time. EXAM: CT HEAD WITHOUT CONTRAST CT CERVICAL SPINE WITHOUT CONTRAST TECHNIQUE: Multidetector CT imaging of the head and cervical spine was performed following the standard protocol without intravenous contrast. Multiplanar CT image reconstructions of the cervical spine were also generated. COMPARISON:  CT head and C-spine 12/30/2019 FINDINGS: CT HEAD FINDINGS Brain: Cerebral ventricle sizes are concordant with the degree of cerebral volume loss. Patchy and confluent areas of decreased attenuation are noted throughout the deep and periventricular white matter of the cerebral hemispheres bilaterally, compatible with chronic microvascular ischemic disease. Bilateral basal ganglia old lacunar infarctions. No evidence of large-territorial acute infarction. No parenchymal hemorrhage. No mass lesion. No extra-axial collection. No mass effect or midline shift. No hydrocephalus. Basilar cisterns are patent. Vascular: No hyperdense vessel. Atherosclerotic calcifications are present within the cavernous internal carotid and vertebral arteries. Skull: No acute fracture or focal lesion. Sinuses/Orbits: Paranasal sinuses and mastoid air cells are clear. The  orbits are unremarkable. Other: None. CT CERVICAL SPINE FINDINGS Alignment: Similar-appearing reversal of the normal cervical lordosis likely due to degenerative changes and positioning. Stable grade 1 anterolisthesis of C4 on C5. Skull base and vertebrae: Similar-appearing multilevel at least moderate degenerative changes of the spine with osteophyte formation, facet arthropathy, uncovertebral arthropathy. At least moderate multilevel osseous neural foraminal stenosis. No acute fracture. No aggressive appearing focal osseous lesion or focal pathologic process. Soft tissues and  spinal canal: No prevertebral fluid or swelling. No visible canal hematoma. Upper chest: Unremarkable. Other: Partially visualized left pacemaker. IMPRESSION: 1. No acute intracranial abnormality. 2. No acute displaced fracture or traumatic listhesis of the cervical spine. Electronically Signed   By: Tish Frederickson M.D.   On: 05/02/2020 16:28    ____________________________________________   PROCEDURES  Procedure(s) performed (including Critical Care):  Marland KitchenMarland KitchenLaceration Repair  Date/Time: 05/02/2020 10:01 PM Performed by: Lucy Chris, PA Authorized by: Lucy Chris, PA   Consent:    Consent obtained:  Verbal   Consent given by:  Patient   Risks, benefits, and alternatives were discussed: yes     Risks discussed:  Infection, pain and need for additional repair Universal protocol:    Procedure explained and questions answered to patient or proxy's satisfaction: yes     Patient identity confirmed:  Verbally with patient Anesthesia:    Anesthesia method:  None Laceration details:    Location:  Scalp   Scalp location:  L parietal   Length (cm):  1.5   Depth (mm):  5 Exploration:    Hemostasis achieved with:  Direct pressure Treatment:    Area cleansed with:  Povidone-iodine and saline   Amount of cleaning:  Standard   Irrigation method:  Tap Skin repair:    Repair method:  Staples   Number of  staples:  3 Approximation:    Approximation:  Close Repair type:    Repair type:  Simple Post-procedure details:    Dressing:  Open (no dressing)   Procedure completion:  Tolerated well, no immediate complications     ____________________________________________   INITIAL IMPRESSION / ASSESSMENT AND PLAN / ED COURSE  As part of my medical decision making, I reviewed the following data within the electronic MEDICAL RECORD NUMBER Nursing notes reviewed and incorporated and Notes from prior ED visits        Patient is a 75 year old female who presents to the emergency department for ration of scalp laceration following a fall when she was trying to sit down in her chair at her nursing facility.  See HPI for further details.  In triage, the patient is mildly hypertensive but otherwise has normal vital signs.  On physical exam, the patient has no gross neurovascular deficits.  She does have a 1.5 cm laceration to the left parietal region.  She also notes midline and paraspinal cervical spine pain.  CT of the head neck was performed and is negative for acute pathology.  Risk and benefits to scalp repair were discussed with the patient, the patient elected to proceed with staples. 3 staples were placed. Patient was given instructions to have these removed in 5 to 7 days. Patient amenable with this plan is stable at time for outpatient follow-up. Dr. Fanny Bien also saw and personally evaluated the patient.      ____________________________________________   FINAL CLINICAL IMPRESSION(S) / ED DIAGNOSES  Final diagnoses:  Laceration of scalp, initial encounter     ED Discharge Orders    None      *Please note:  Melanie F Kunsman was evaluated in Emergency Department on 05/02/2020 for the symptoms described in the history of present illness. She was evaluated in the context of the global COVID-19 pandemic, which necessitated consideration that the patient might be at risk for infection with the  SARS-CoV-2 virus that causes COVID-19. Institutional protocols and algorithms that pertain to the evaluation of patients at risk for COVID-19 are in a state of rapid change based  on information released by regulatory bodies including the CDC and federal and state organizations. These policies and algorithms were followed during the patient's care in the ED.  Some ED evaluations and interventions may be delayed as a result of limited staffing during and the pandemic.*   Note:  This document was prepared using Dragon voice recognition software and may include unintentional dictation errors.   Lucy Chris, PA 05/02/20 2203    Sharyn Creamer, MD 05/04/20 [phone]

## 2020-05-03 IMAGING — CT CT CERVICAL SPINE W/O CM
2 series · 10 of 27 positions shown, 13 images · non-contrast
Comparison: Head CT dated 03/20/2019.

CLINICAL DATA: 73-year-old female with head trauma.

EXAM:
CT HEAD WITHOUT CONTRAST
CT CERVICAL SPINE WITHOUT CONTRAST
TECHNIQUE: Multidetector CT imaging of the head and cervical spine was
performed following the standard protocol without intravenous
contrast. Multiplanar CT image reconstructions of the cervical spine
were also generated.

[Series 3: c spine soft · axial · 0.47mm/px · z∈[-272,-174]mm · 5 of 71 slices shown, 7 images]
[im 11/71  soft-tissue]
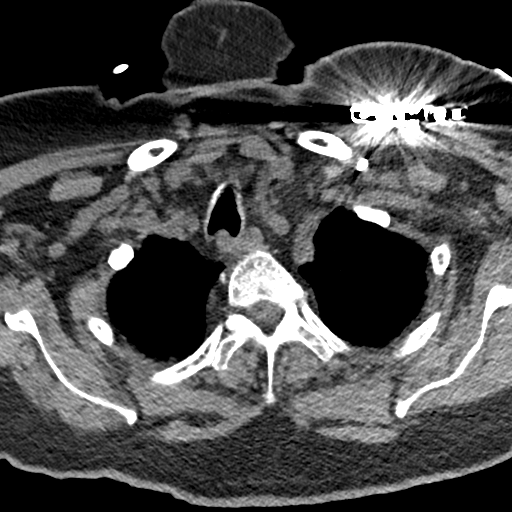
[im 11/71  bone]
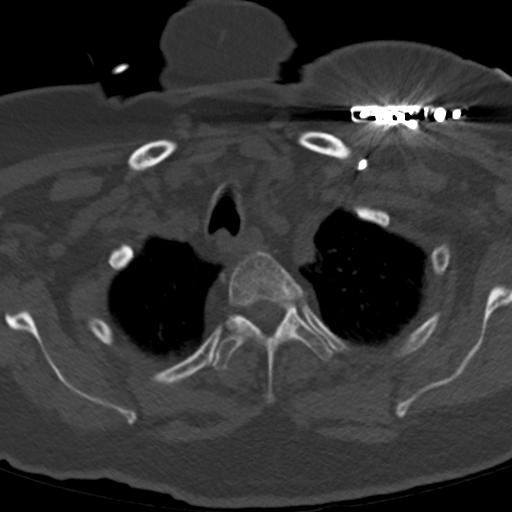
[im 22/71  bone]
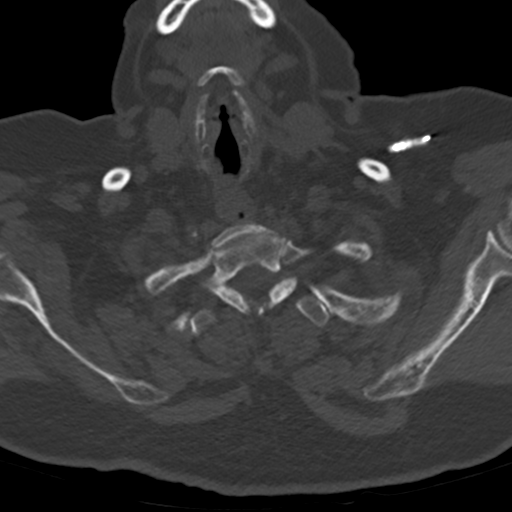
[im 38/71  bone]
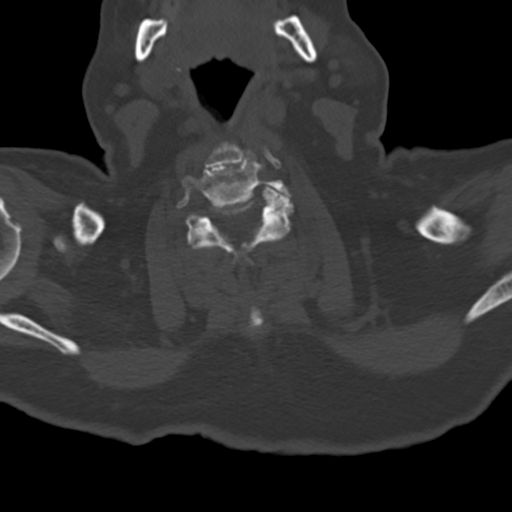
[im 49/71  bone]
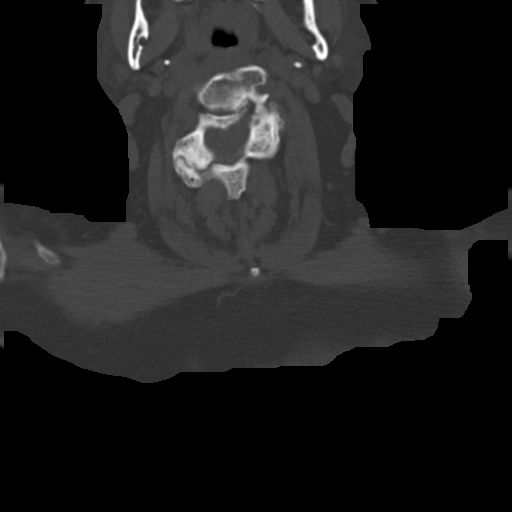
[im 60/71  soft-tissue]
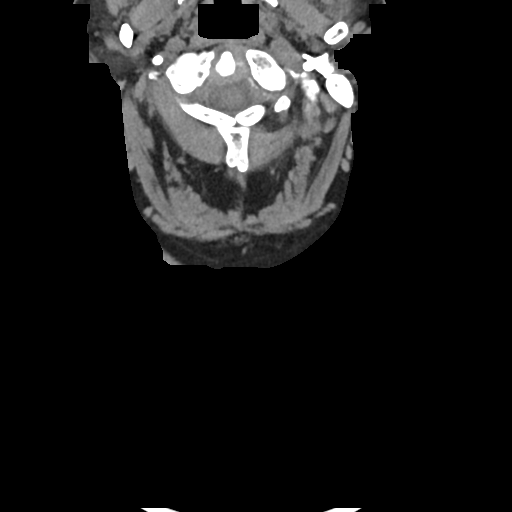
[im 60/71  bone]
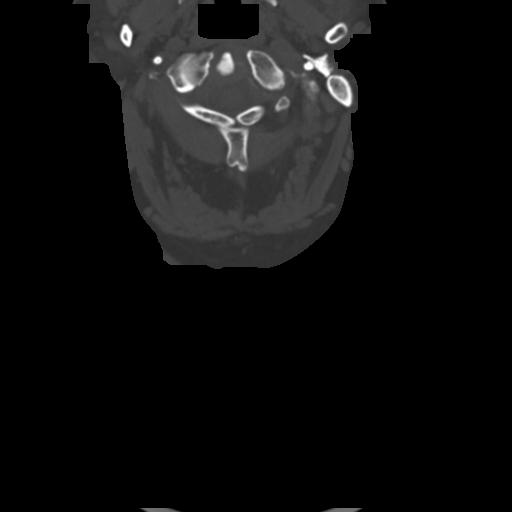

[Series 4: sagittal bone · sagittal · 0.21mm/px · 5 of 66 slices shown, 6 images]
[im 22/66  bone]
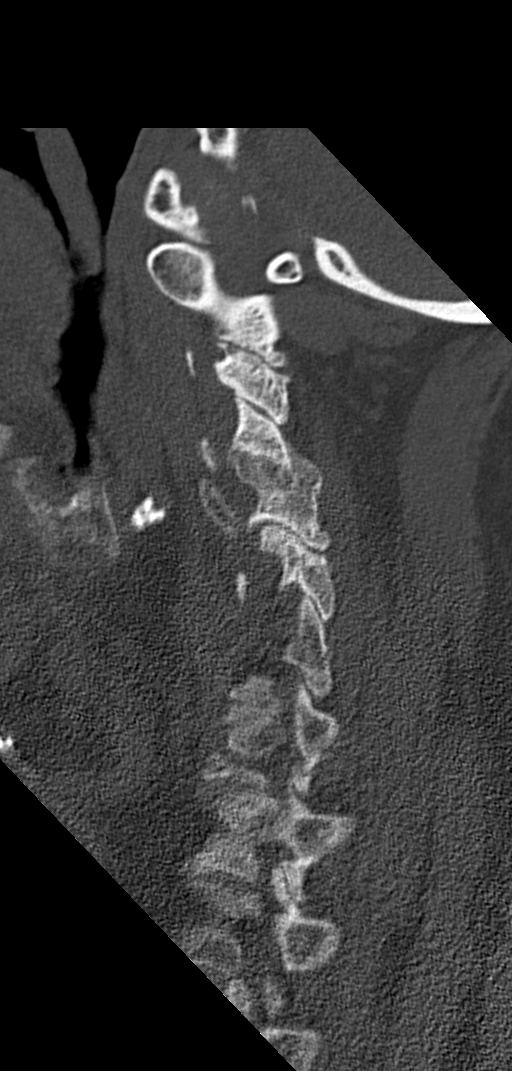
[im 28/66  bone]
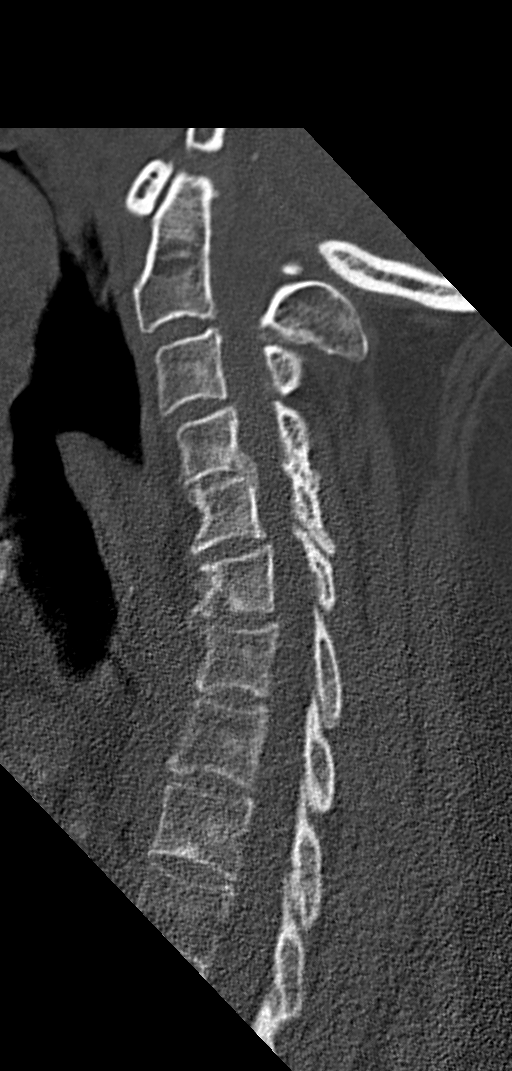
[im 33/66  soft-tissue]
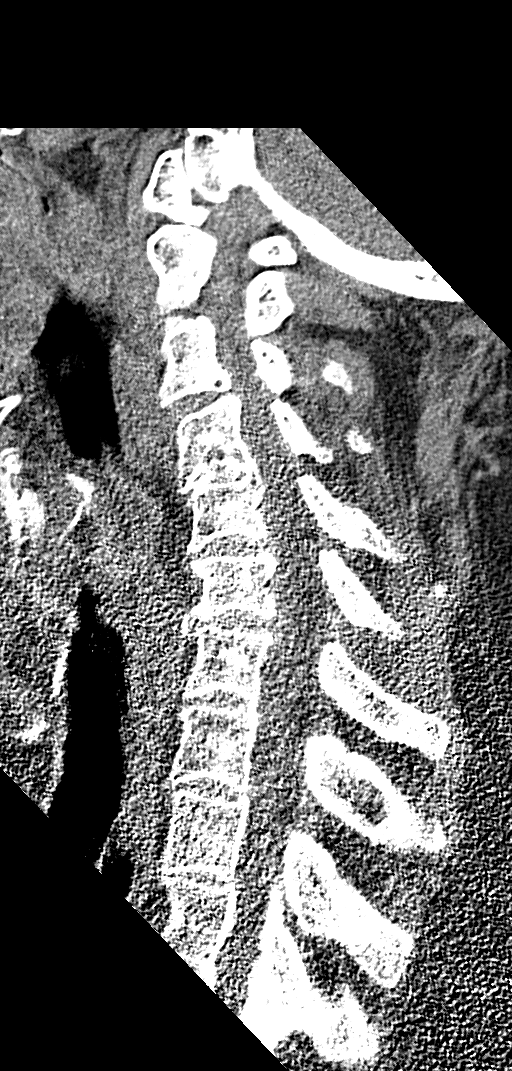
[im 33/66  bone]
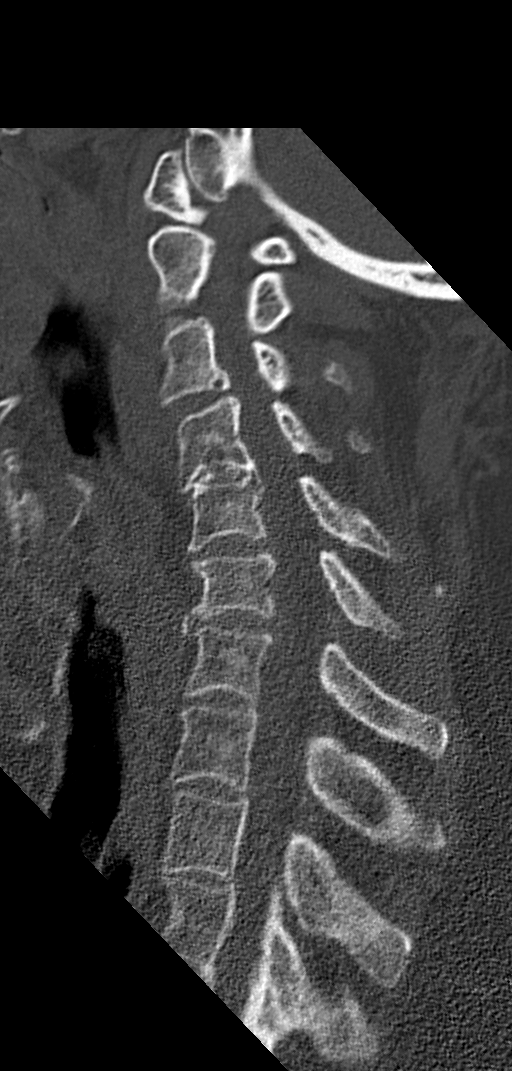
[im 38/66  bone]
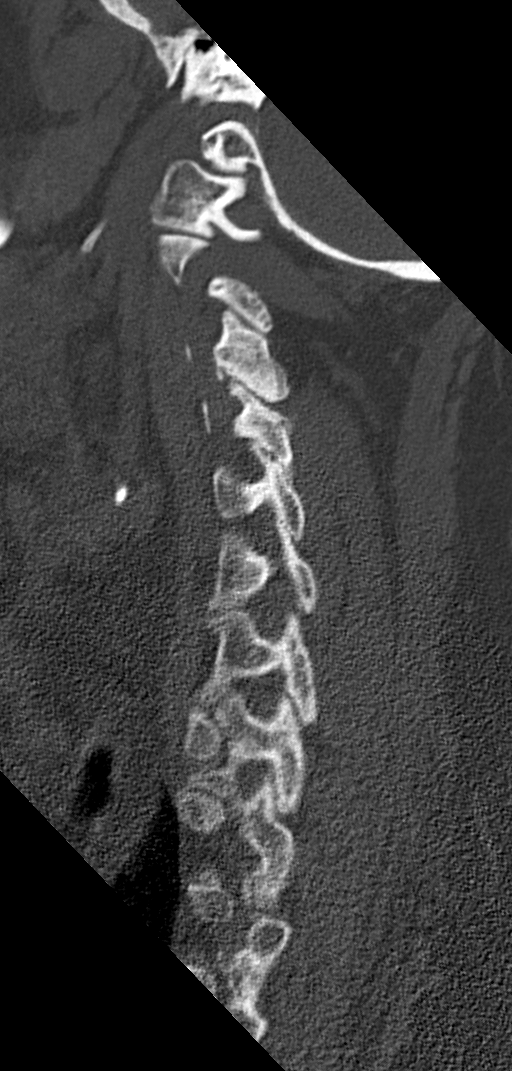
[im 44/66  bone]
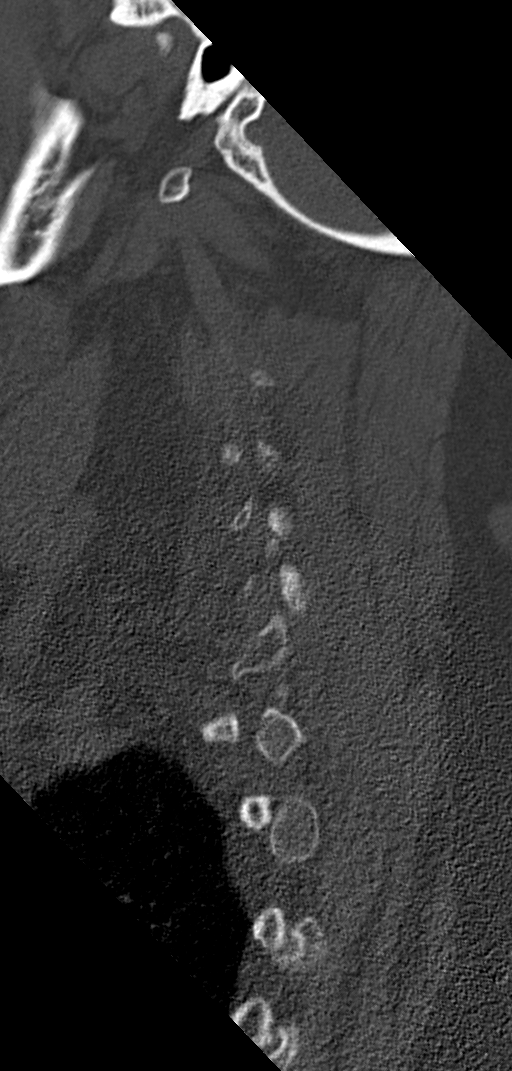

[10 of 27 positions shown; findings below may reference images not displayed]

FINDINGS: CT HEAD FINDINGS

Brain: There is mild age-related atrophy and chronic microvascular
ischemic changes. Small old right basal ganglia lacunar infarct.
There is no acute intracranial hemorrhage. No mass effect or midline
shift. No extra-axial fluid collection.

Vascular: No hyperdense vessel or unexpected calcification.

Skull: Normal. Negative for fracture or focal lesion.

Sinuses/Orbits: No acute finding.

Other: None

CT CERVICAL SPINE FINDINGS

Alignment: No acute subluxation. There is reversal of normal
cervical lordosis which may be positional or due to muscle spasm.

Skull base and vertebrae: No acute fracture. Osteopenia. There is
incomplete bony fusion of the posterior ring of C1.

Soft tissues and spinal canal: No prevertebral fluid or swelling. No
visible canal hematoma.

Disc levels:  Multilevel degenerative changes.

Upper chest: Negative.

Other: Partially visualized pacemaker wire. Atherosclerotic
calcification of the aortic arch. Bilateral carotid bulb calcified
plaques.
IMPRESSION: 1. No acute intracranial pathology.
2. Mild age-related atrophy and chronic microvascular ischemic
changes.
3. No acute/traumatic cervical spine pathology.
4. Aortic Atherosclerosis (NB8KN-EOD.D).

## 2020-05-03 IMAGING — CT CT HEAD W/O CM
3 series · 15 of 47 positions shown, 18 images · non-contrast
Comparison: Head CT dated 03/20/2019.

CLINICAL DATA: 73-year-old female with head trauma.

EXAM:
CT HEAD WITHOUT CONTRAST
CT CERVICAL SPINE WITHOUT CONTRAST
TECHNIQUE: Multidetector CT imaging of the head and cervical spine was
performed following the standard protocol without intravenous
contrast. Multiplanar CT image reconstructions of the cervical spine
were also generated.

[Series 3: head wo · axial · 0.42mm/px · z∈[-151,-26]mm · 9 of 30 slices shown, 12 images]
[im 3/30  brain]
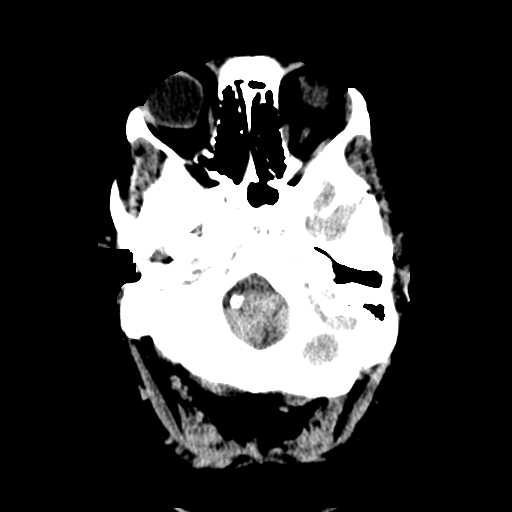
[im 3/30  bone]
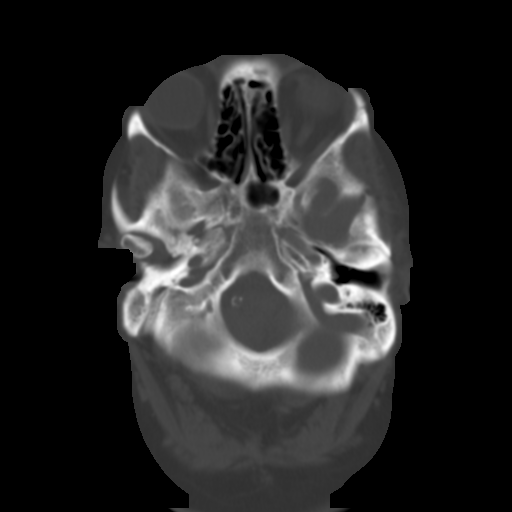
[im 6/30  brain]
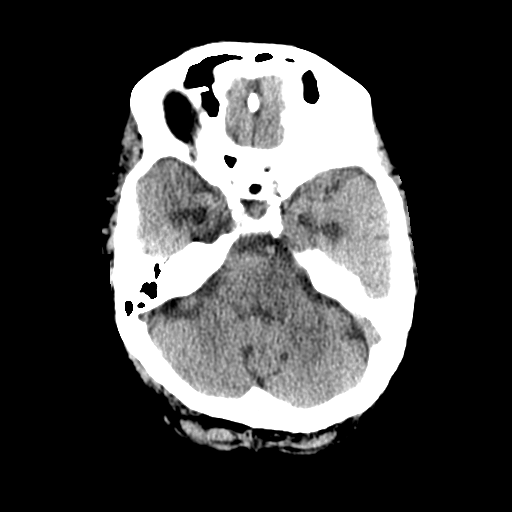
[im 9/30  brain]
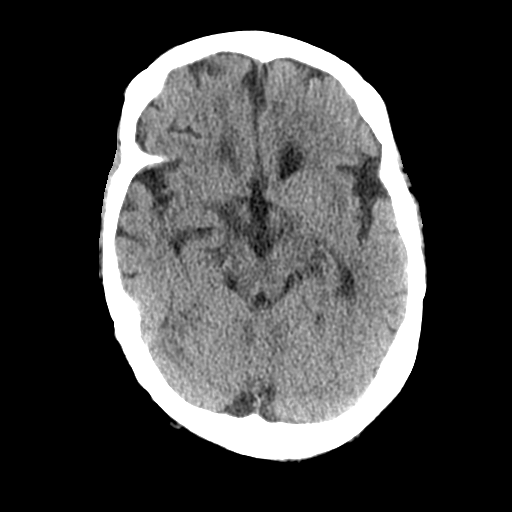
[im 12/30  brain]
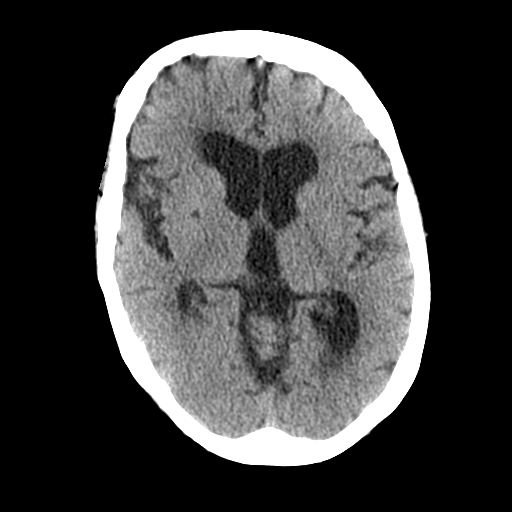
[im 16/30  brain]
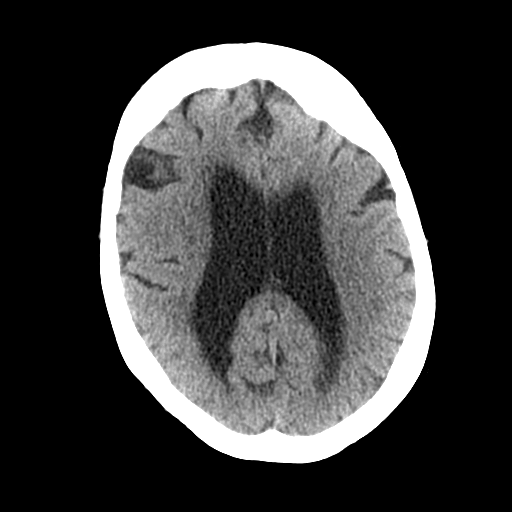
[im 16/30  bone]
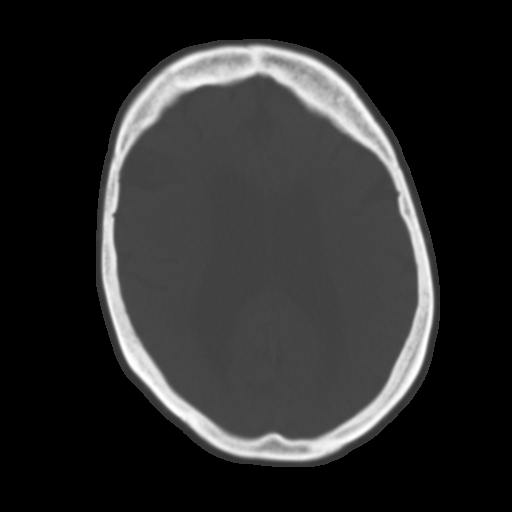
[im 19/30  brain]
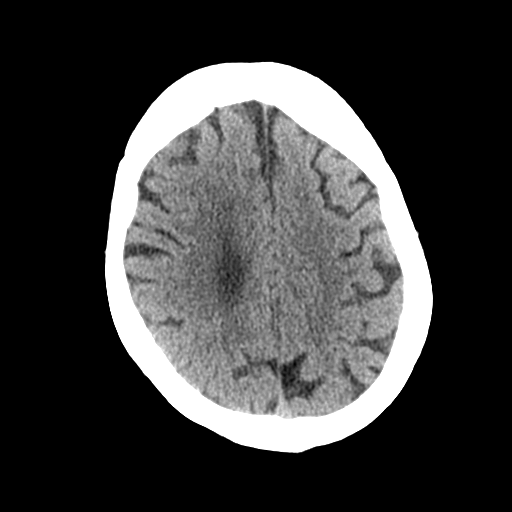
[im 22/30  brain]
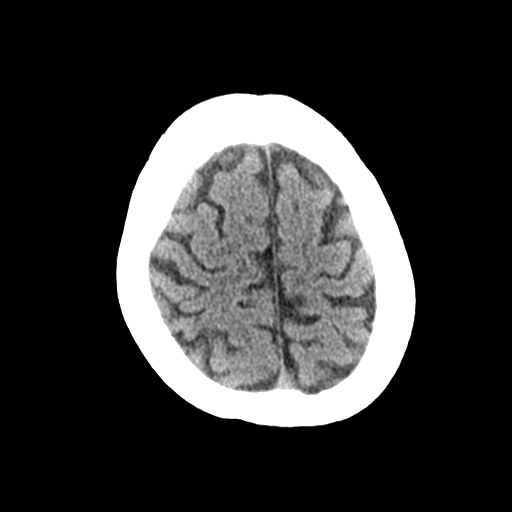
[im 25/30  brain]
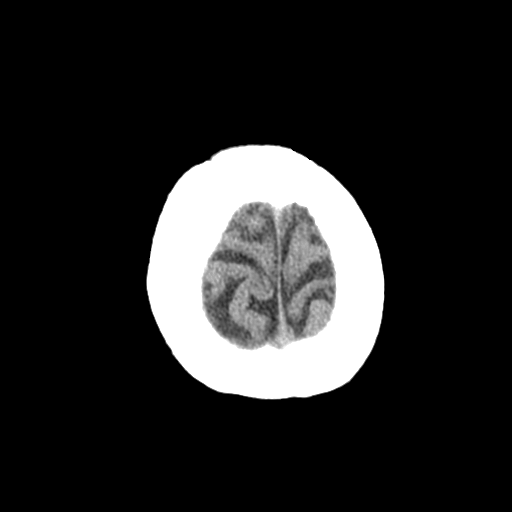
[im 28/30  brain]
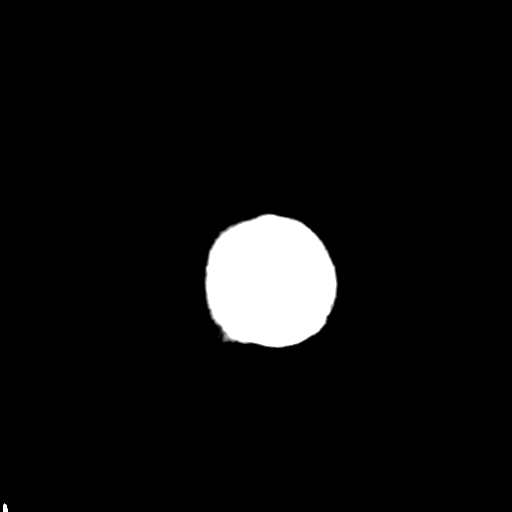
[im 28/30  bone]
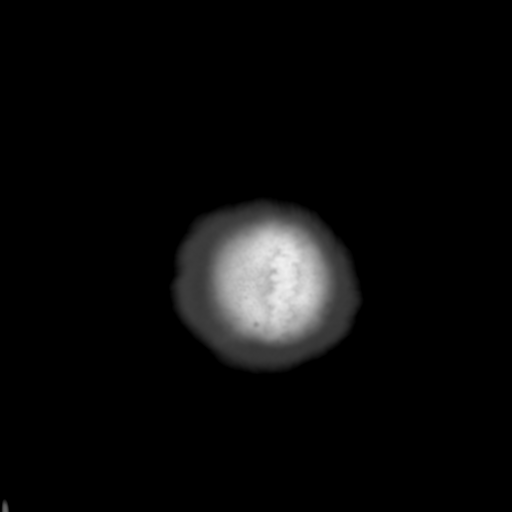

[Series 4: coronal soft tissue · coronal · 0.29mm/px · 3 of 60 slices shown]
[im 20/60  brain]
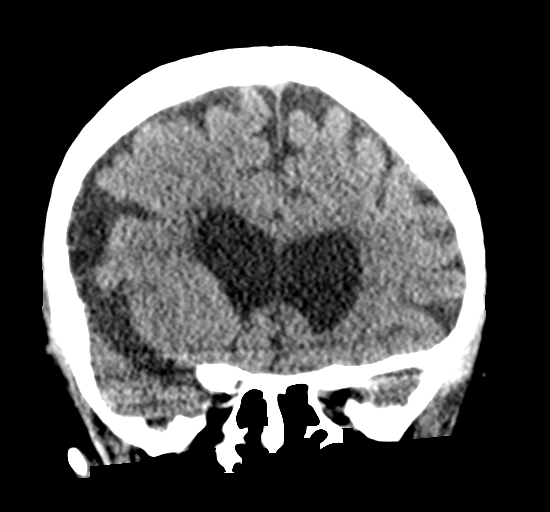
[im 27/60  brain]
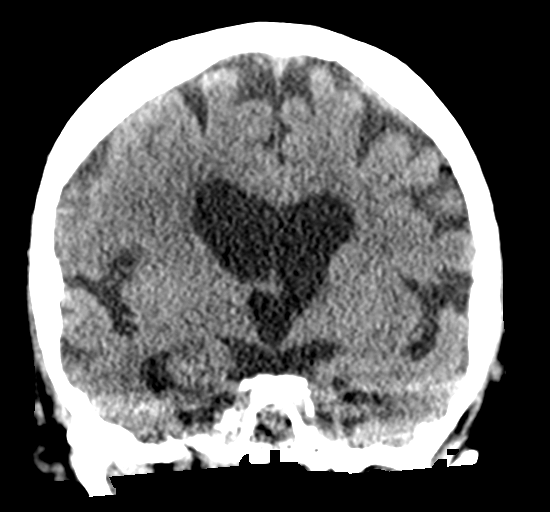
[im 33/60  brain]
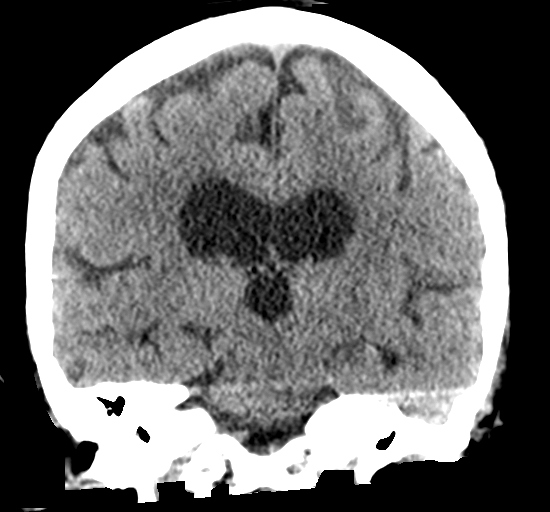

[Series 5: sagittal soft tissue · sagittal · 0.32mm/px · 3 of 47 slices shown]
[im 16/47  brain]
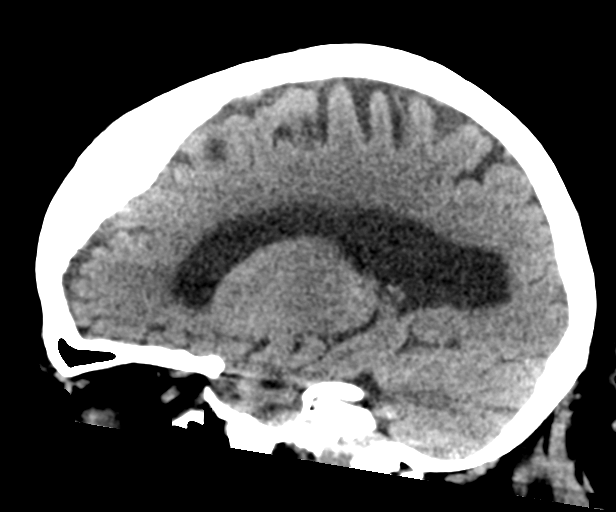
[im 24/47  brain]
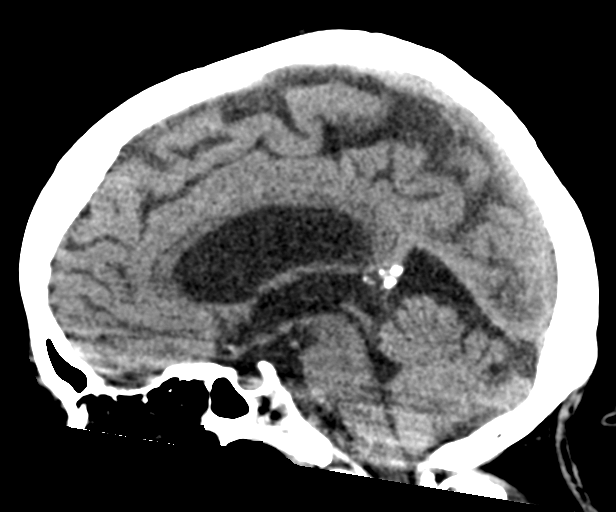
[im 31/47  brain]
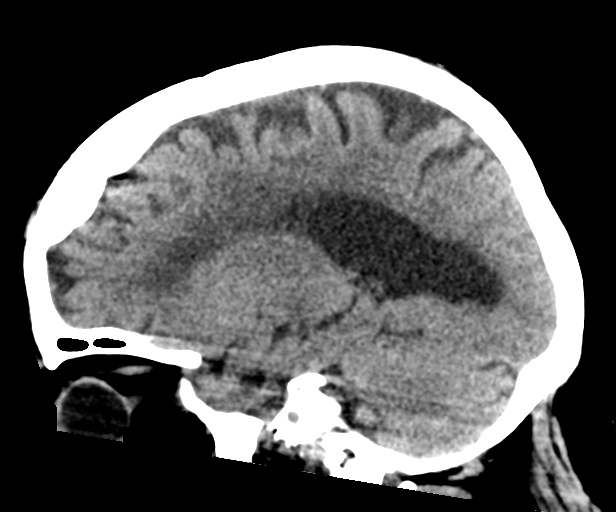

[15 of 47 positions shown; findings below may reference images not displayed]

FINDINGS: CT HEAD FINDINGS

Brain: There is mild age-related atrophy and chronic microvascular
ischemic changes. Small old right basal ganglia lacunar infarct.
There is no acute intracranial hemorrhage. No mass effect or midline
shift. No extra-axial fluid collection.

Vascular: No hyperdense vessel or unexpected calcification.

Skull: Normal. Negative for fracture or focal lesion.

Sinuses/Orbits: No acute finding.

Other: None

CT CERVICAL SPINE FINDINGS

Alignment: No acute subluxation. There is reversal of normal
cervical lordosis which may be positional or due to muscle spasm.

Skull base and vertebrae: No acute fracture. Osteopenia. There is
incomplete bony fusion of the posterior ring of C1.

Soft tissues and spinal canal: No prevertebral fluid or swelling. No
visible canal hematoma.

Disc levels:  Multilevel degenerative changes.

Upper chest: Negative.

Other: Partially visualized pacemaker wire. Atherosclerotic
calcification of the aortic arch. Bilateral carotid bulb calcified
plaques.
IMPRESSION: 1. No acute intracranial pathology.
2. Mild age-related atrophy and chronic microvascular ischemic
changes.
3. No acute/traumatic cervical spine pathology.
4. Aortic Atherosclerosis (NB8KN-EOD.D).

## 2020-05-03 IMAGING — CR DG CHEST 1V PORT
1 series · 1 of 1 positions shown · non-contrast
Comparison: 03/20/2019

CLINICAL DATA: Weakness

EXAM:
PORTABLE CHEST 1 VIEW

[dg chest port 1 view]
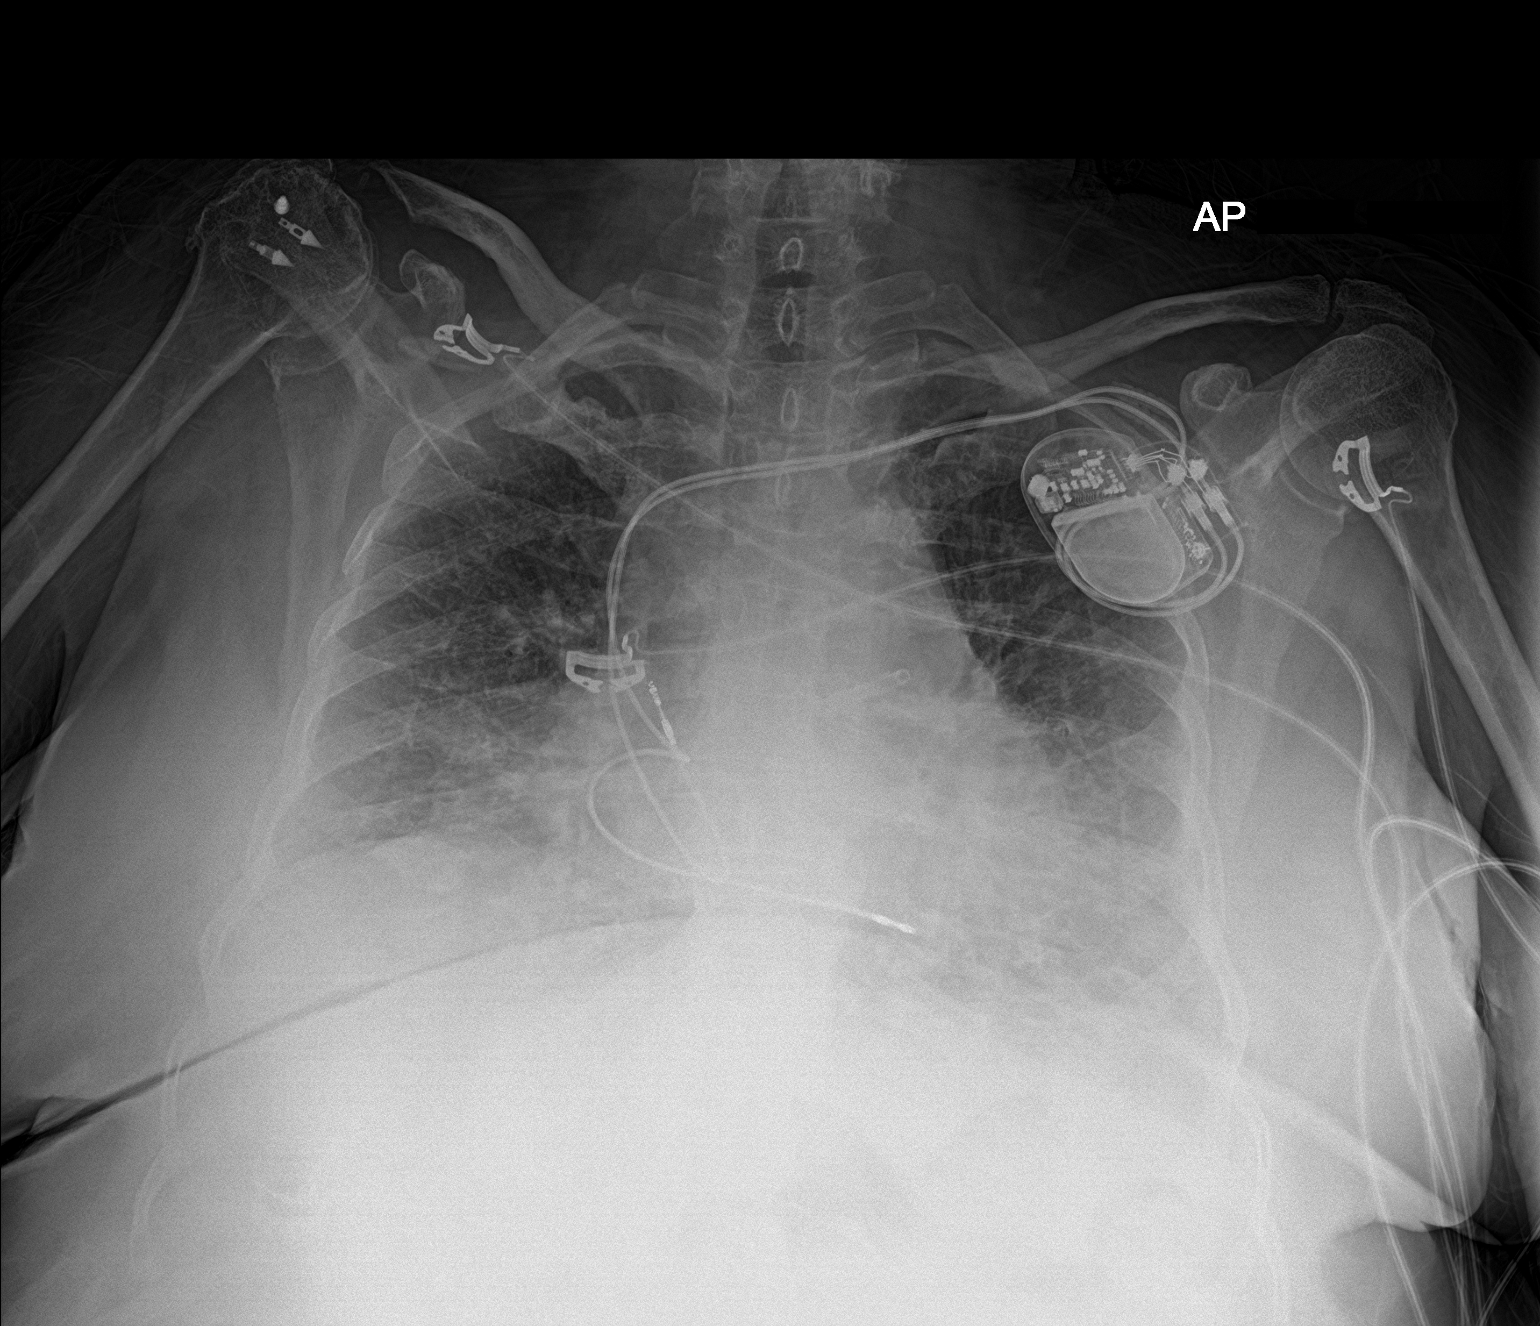

[1 of 1 positions shown; findings below may reference images not displayed]

FINDINGS: Left pacer remains in place, unchanged. Bilateral perihilar and
lower lobe airspace opacities concerning for edema. Possible
layering effusions. No acute bony abnormality.
IMPRESSION: Bilateral airspace opacities concerning for edema. Suspect small
layering effusions.

## 2020-05-08 IMAGING — CR DG HIP (WITH OR WITHOUT PELVIS) 2-3V*L*
3 series · 3 of 3 positions shown · non-contrast
Comparison: Pelvis radiograph 03/21/2019

CLINICAL DATA: Fall with left hip pain.

EXAM:
DG HIP (WITH OR WITHOUT PELVIS) 2-3V LEFT

[pelvis ap]
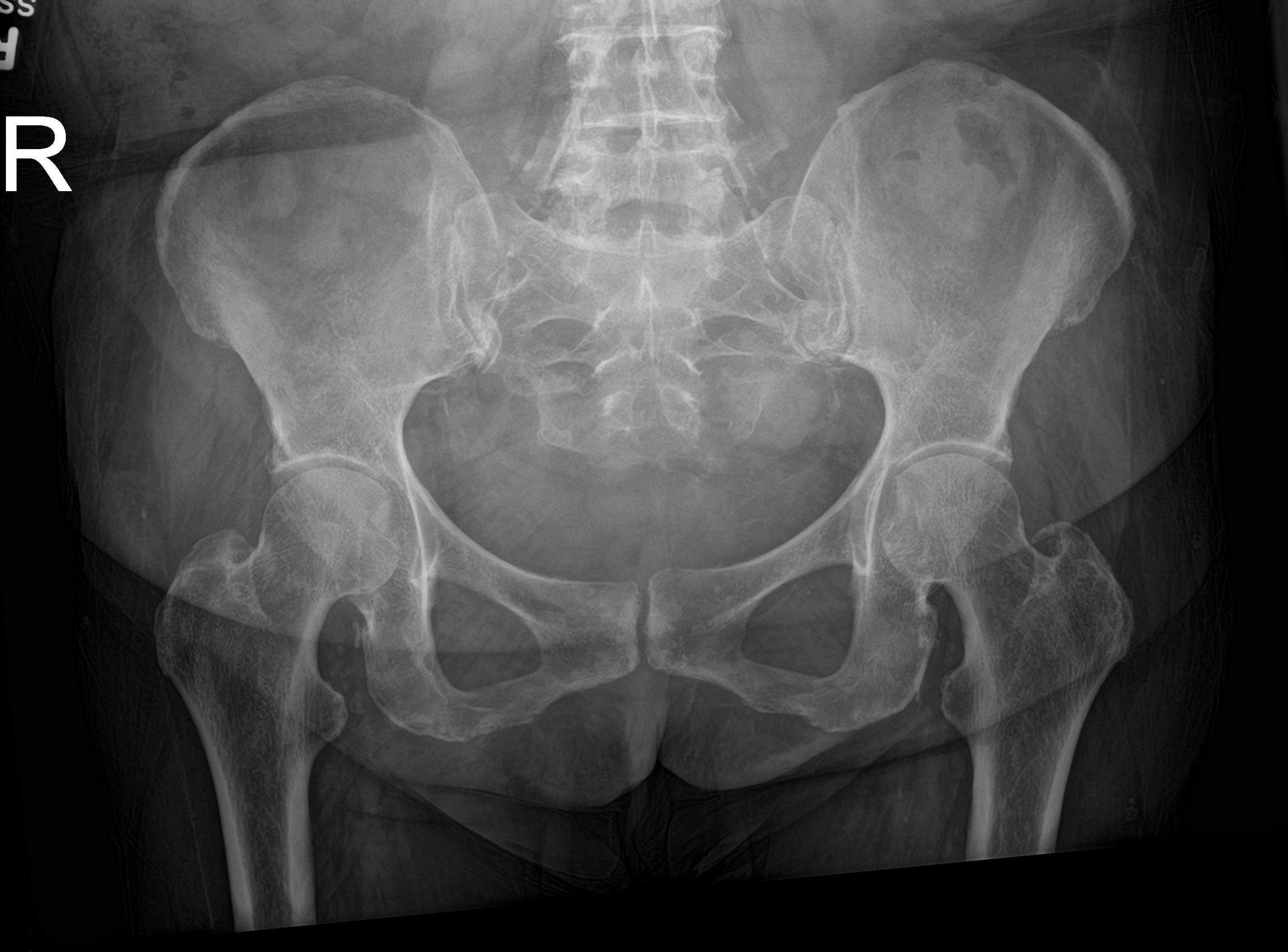

[hip ap]
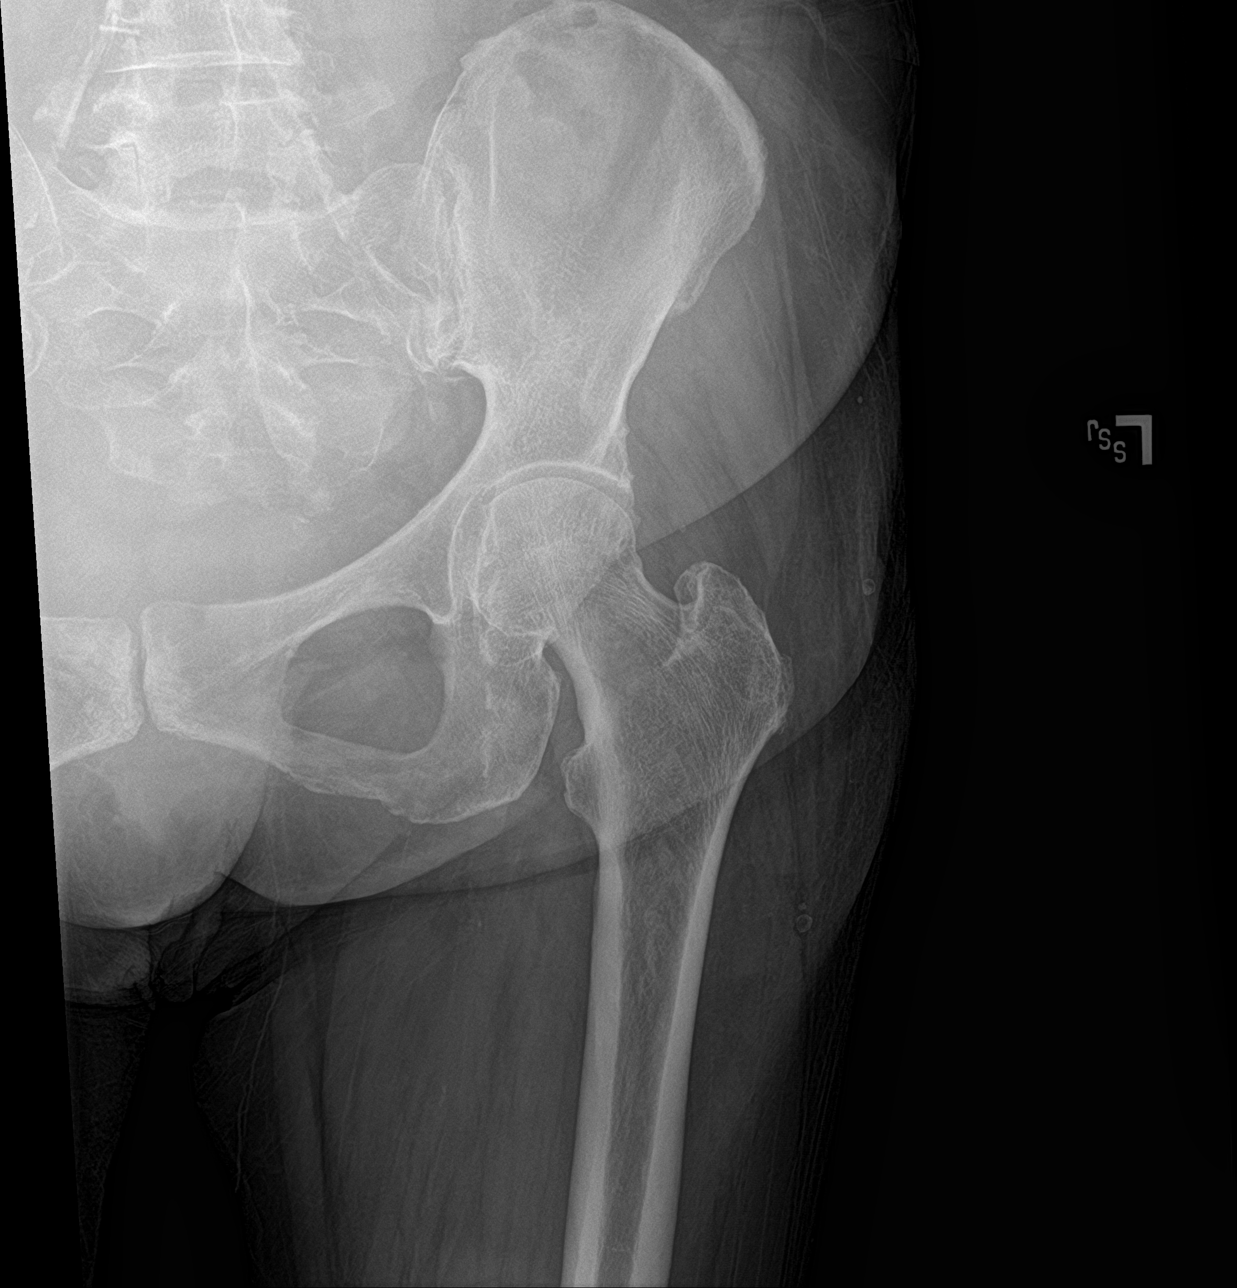

[hip lat]
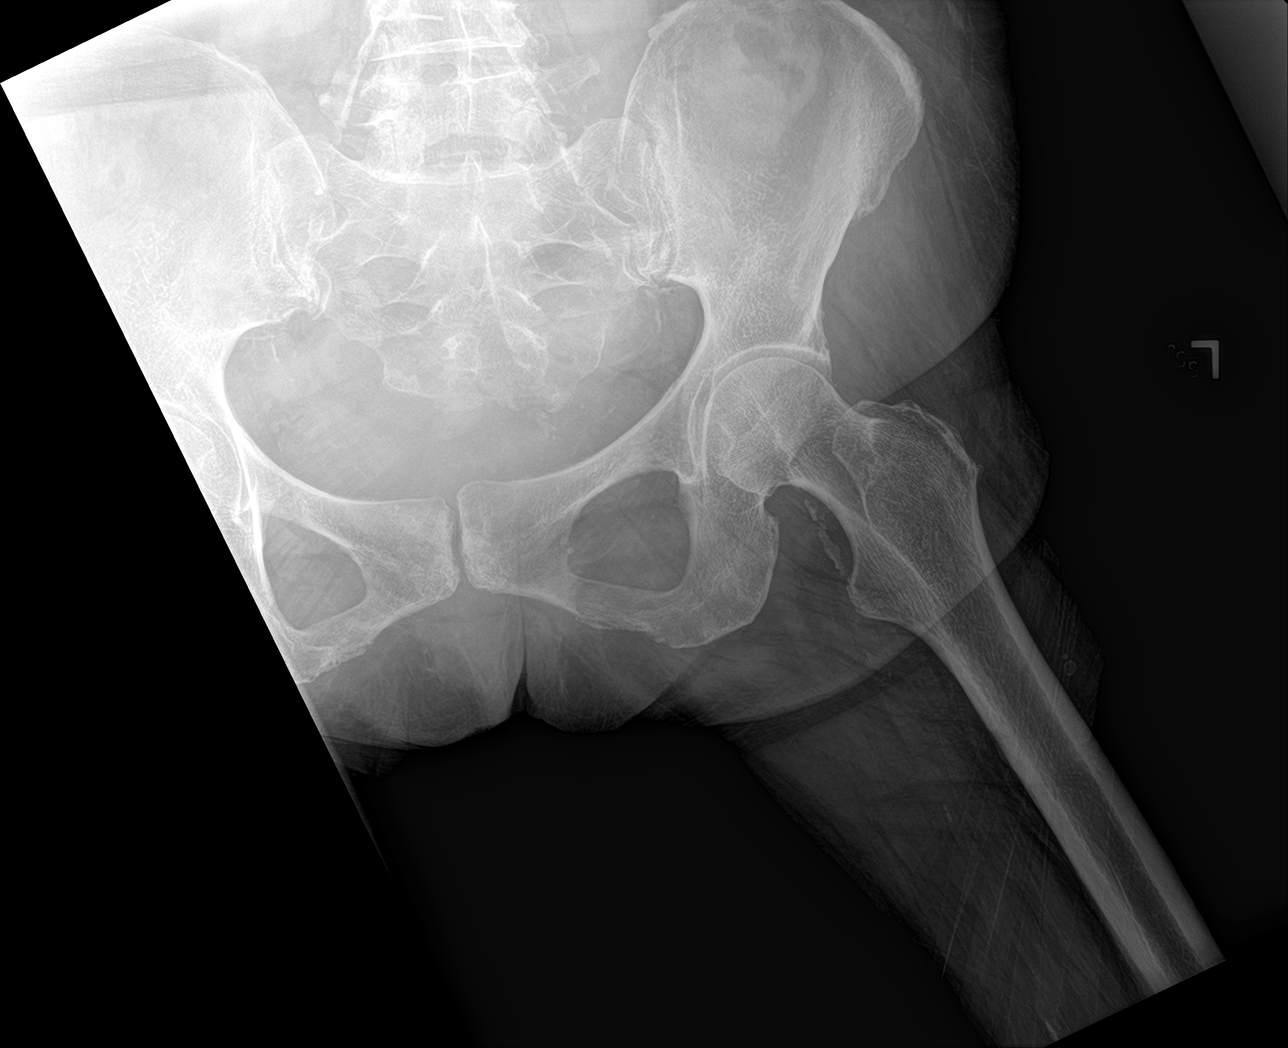

[3 of 3 positions shown; findings below may reference images not displayed]

FINDINGS: The cortical margins of the bony pelvis and left hip are intact. No
fracture. Pubic symphysis and sacroiliac joints are congruent. Both
femoral heads are well-seated in the respective acetabula. Iliac and
peripheral vascular calcifications.
IMPRESSION: Negative for fracture of the pelvis or left hip.

## 2020-05-20 IMAGING — CT CT HEAD W/O CM
3 series · 16 of 47 positions shown, 19 images · non-contrast
Comparison: 05/31/2019

CLINICAL DATA: Headache and generalized weakness.  Body aches.

EXAM:
CT HEAD WITHOUT CONTRAST
TECHNIQUE: Contiguous axial images were obtained from the base of the skull
through the vertex without intravenous contrast.

[Series 2: head wo · axial · 0.47mm/px · z∈[-204,-69]mm · 10 of 33 slices shown, 13 images]
[im 3/33  brain]
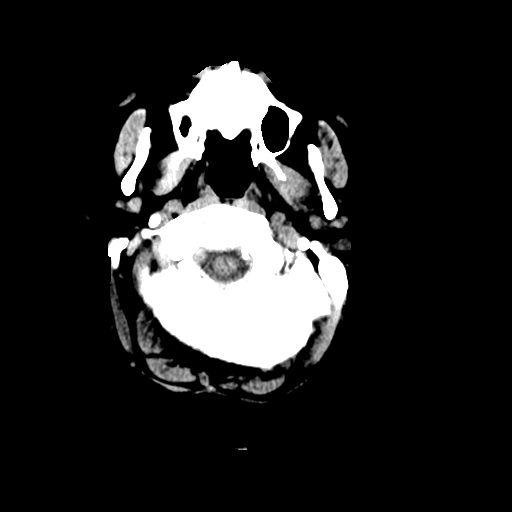
[im 3/33  bone]
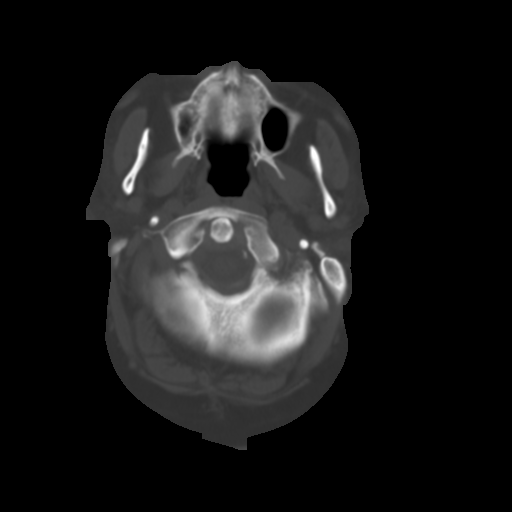
[im 6/33  brain]
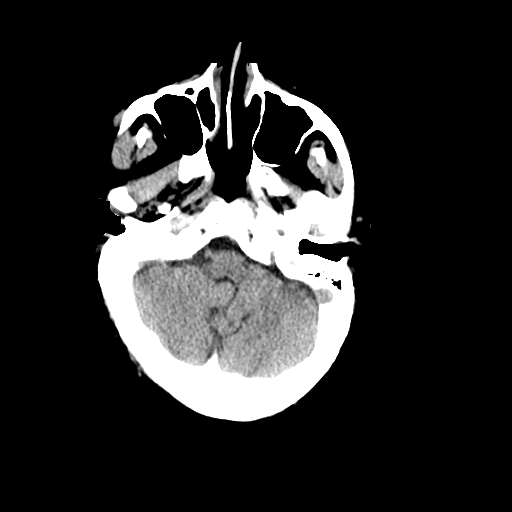
[im 9/33  brain]
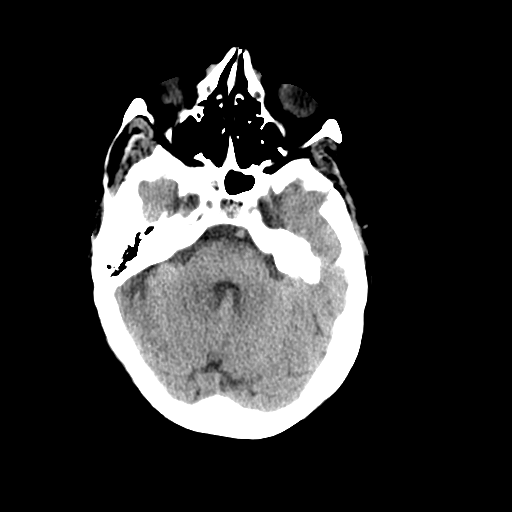
[im 12/33  brain]
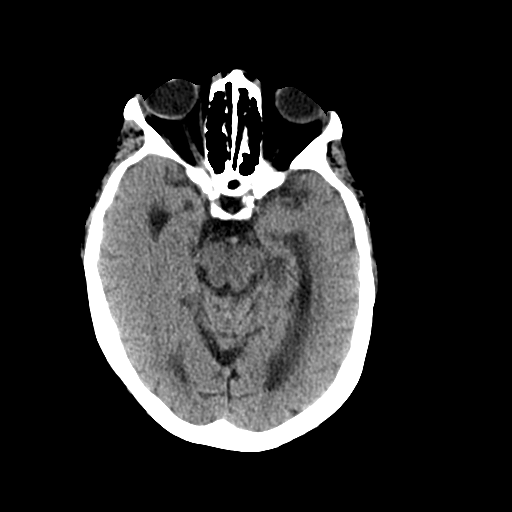
[im 15/33  brain]
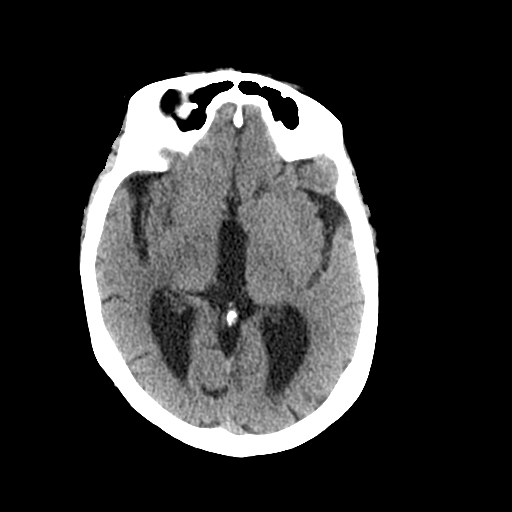
[im 15/33  bone]
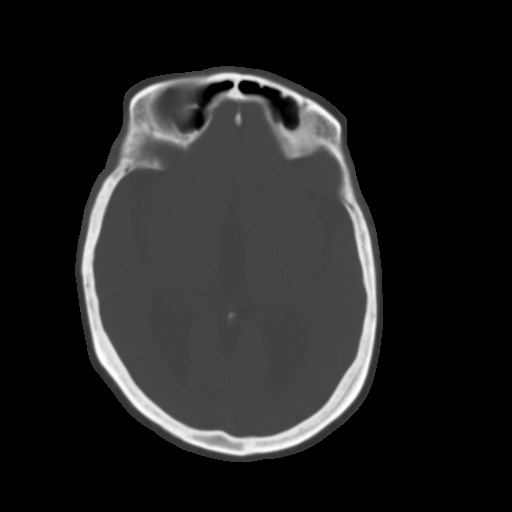
[im 18/33  brain]
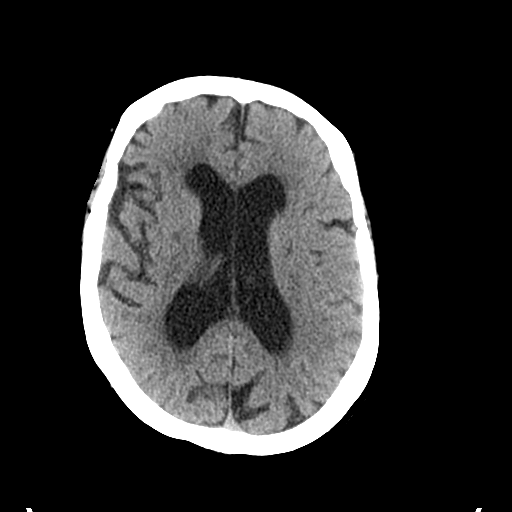
[im 21/33  brain]
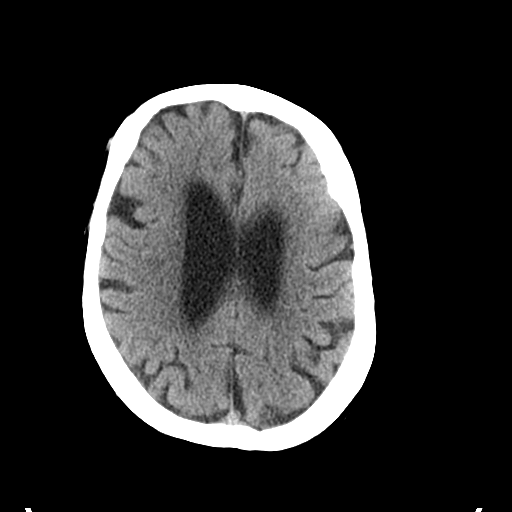
[im 25/33  brain]
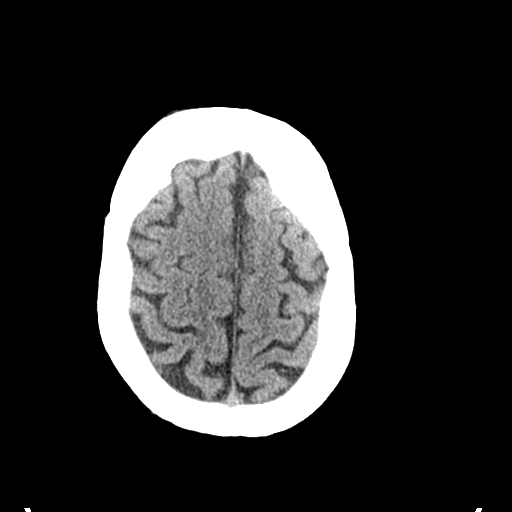
[im 27/33  brain]
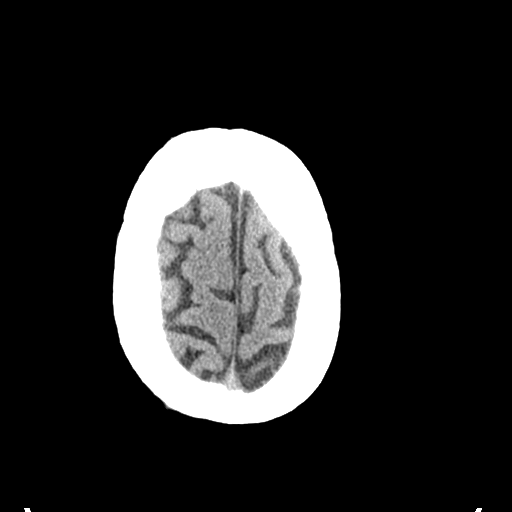
[im 27/33  bone]
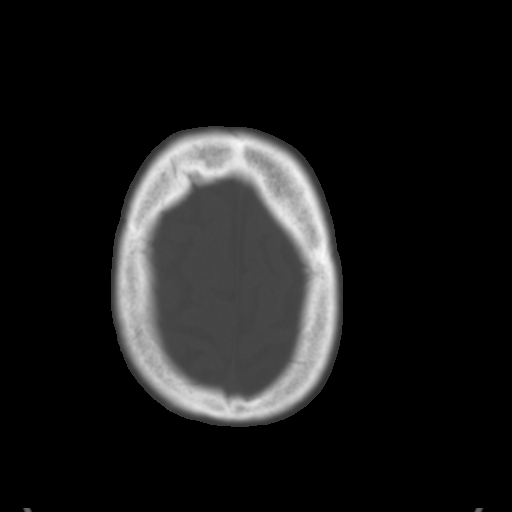
[im 30/33  brain]
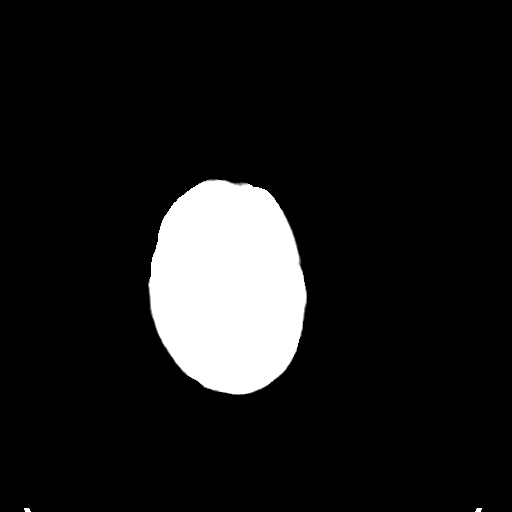

[Series 4: coronal soft tissue · coronal · 0.33mm/px · 3 of 70 slices shown]
[im 27/70  brain]
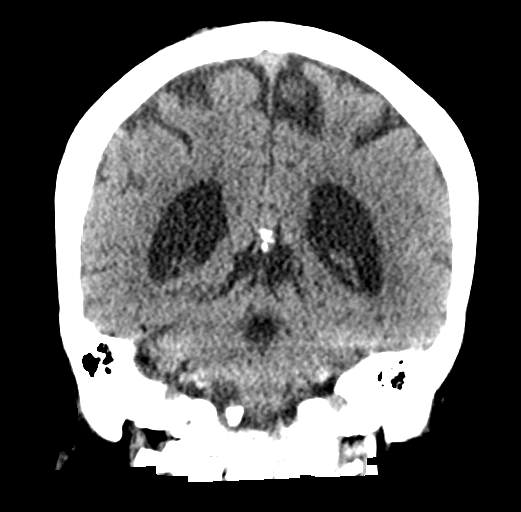
[im 32/70  brain]
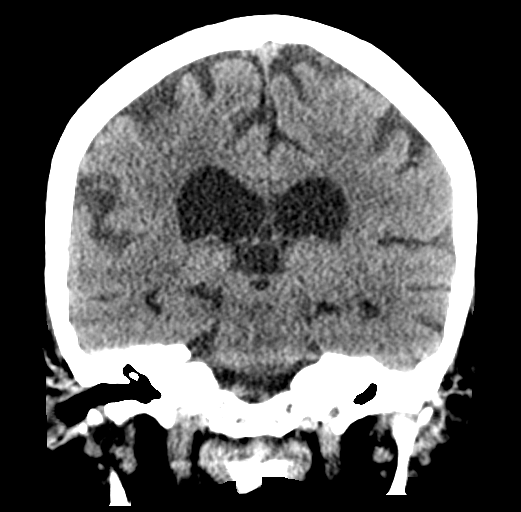
[im 38/70  brain]
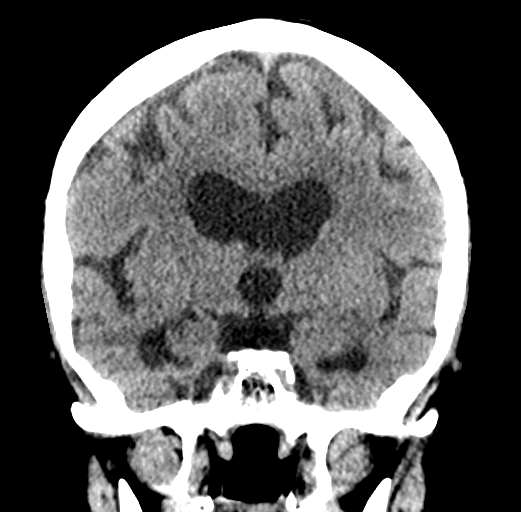

[Series 5: sagittal soft tissue · sagittal · 0.33mm/px · 3 of 56 slices shown]
[im 19/56  brain]
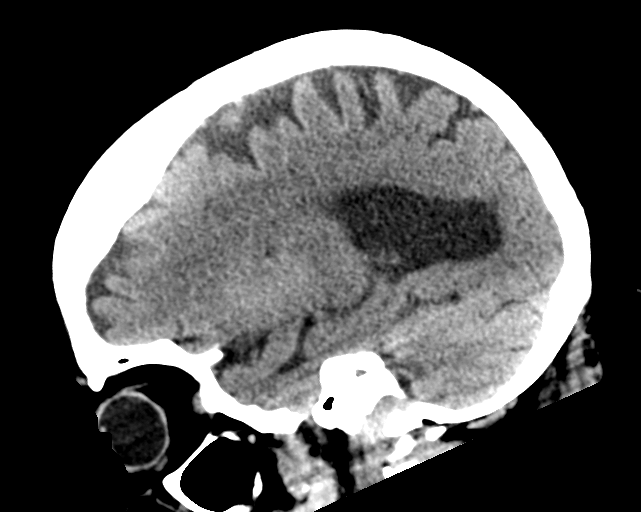
[im 28/56  brain]
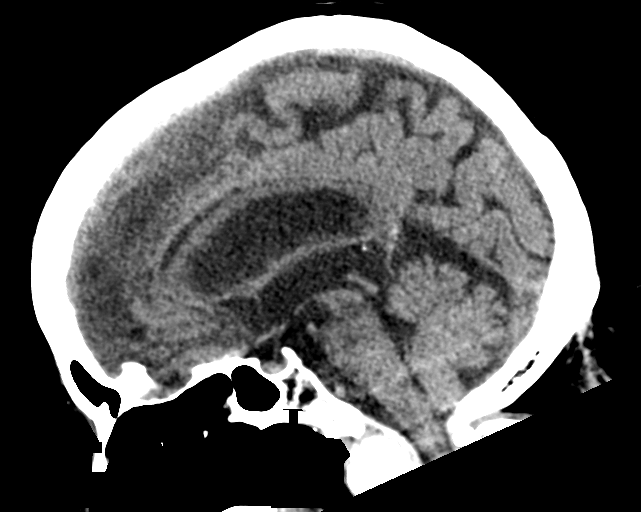
[im 37/56  brain]
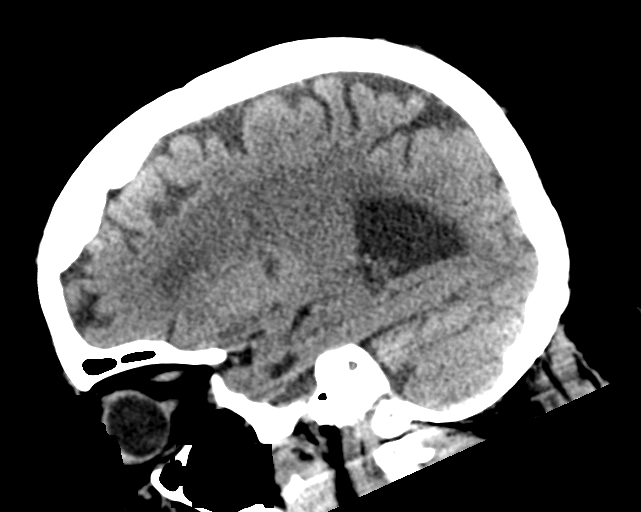

[16 of 47 positions shown; findings below may reference images not displayed]

FINDINGS: Brain: No evidence of acute infarction, hemorrhage, hydrocephalus,
extra-axial collection or mass lesion/mass effect.

There is ventricular enlargement reflecting moderate atrophy,
greater than expected for age, but stable from prior head CT. Mild
periventricular white matter hypoattenuation is noted consistent
with chronic microvascular ischemic change. Small old lacunar
infarct the right basal ganglia.

Vascular: No hyperdense vessel or unexpected calcification.

Skull: Normal. Negative for fracture or focal lesion.

Sinuses/Orbits: Globes and orbits are unremarkable. Sinuses and
mastoid air cells are clear.

Other: None.
IMPRESSION: 1. No acute intracranial abnormalities.
2. Moderate atrophy and mild chronic microvascular ischemic change.

## 2020-05-20 IMAGING — CR DG CHEST 2V
1 series · 2 of 2 positions shown · non-contrast
Comparison: May 26, 2019

CLINICAL DATA: Cough

EXAM:
CHEST - 2 VIEW

[Series 1: dg chest 2 view · 0.14mm/px · 2 of 2 slices shown]
[im 1/2]
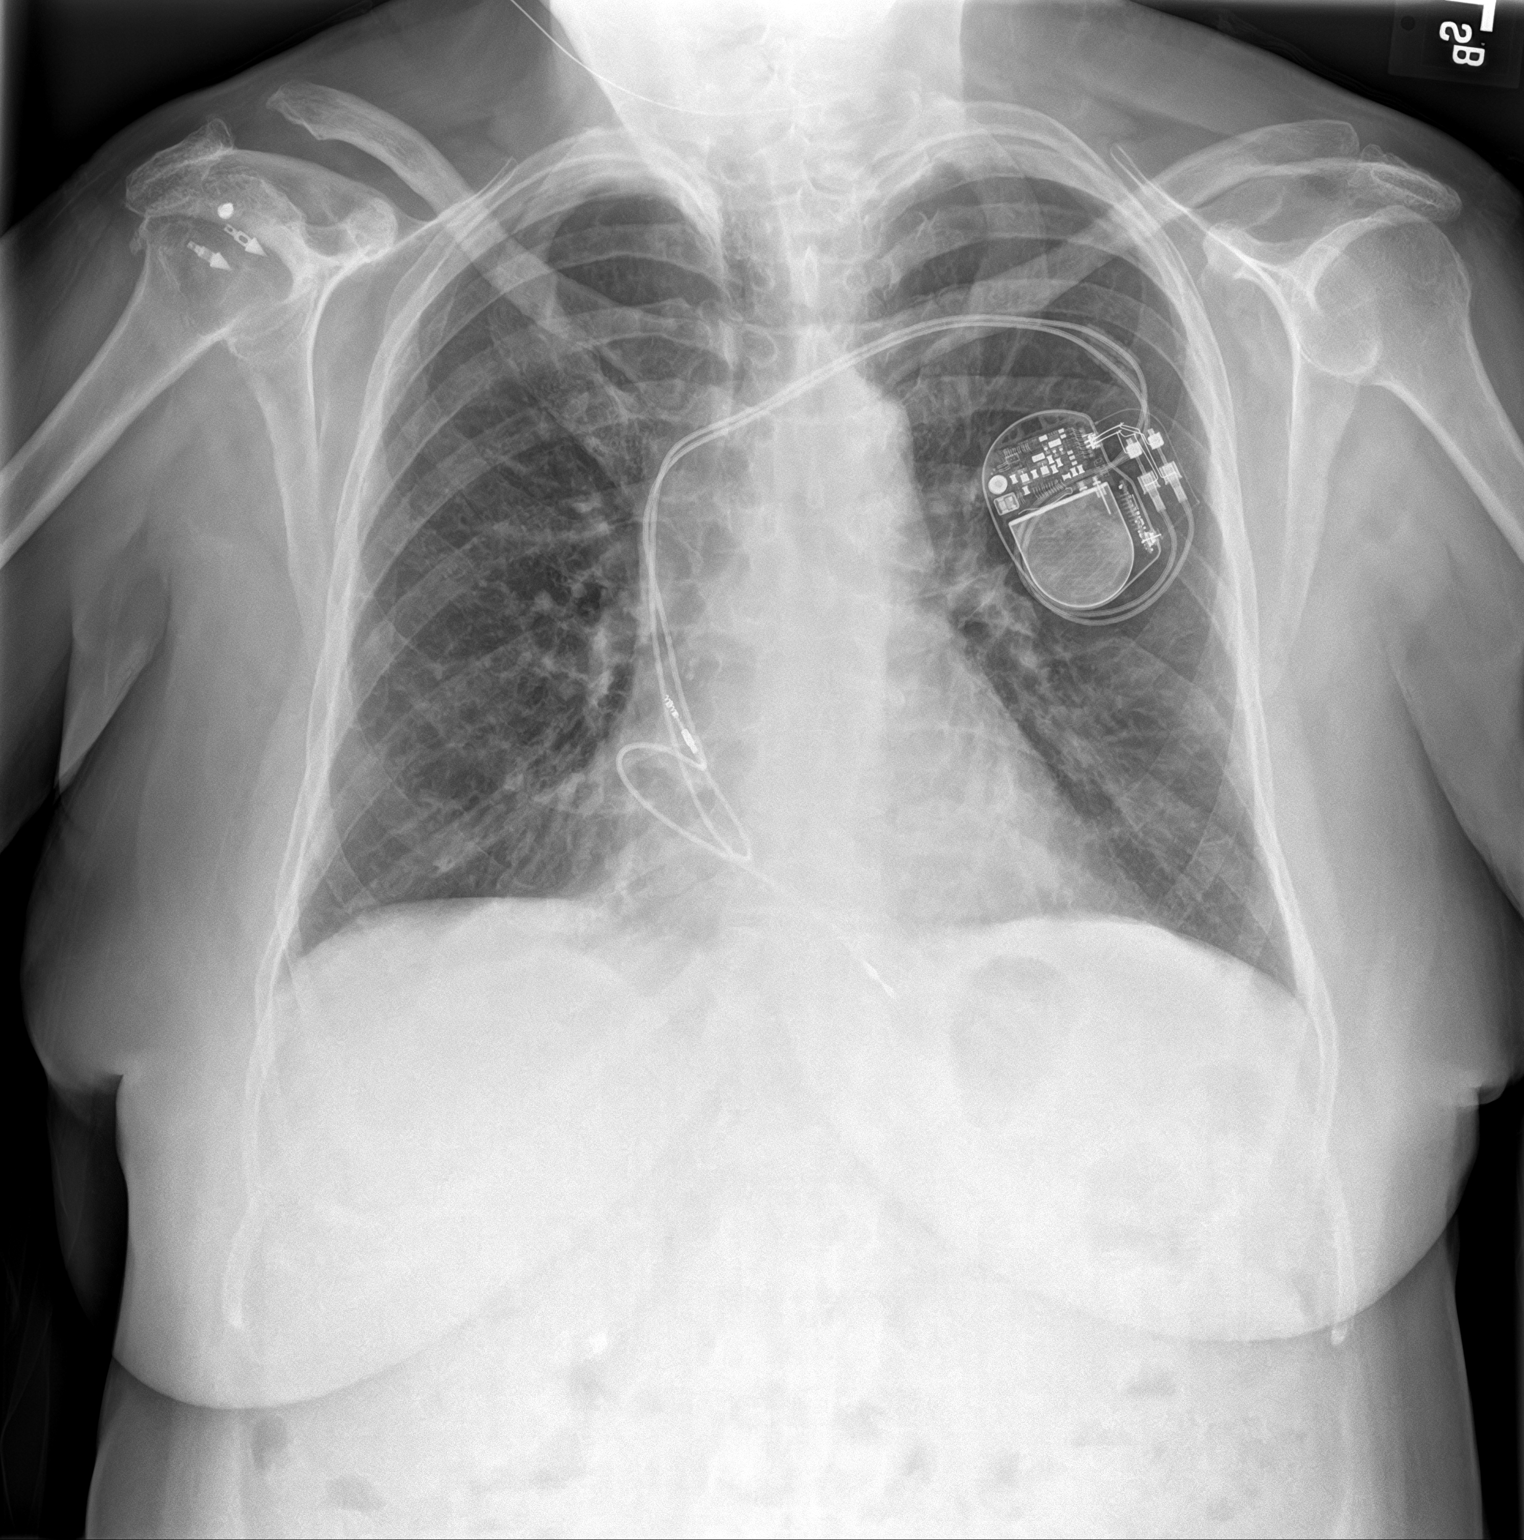
[im 2/2]
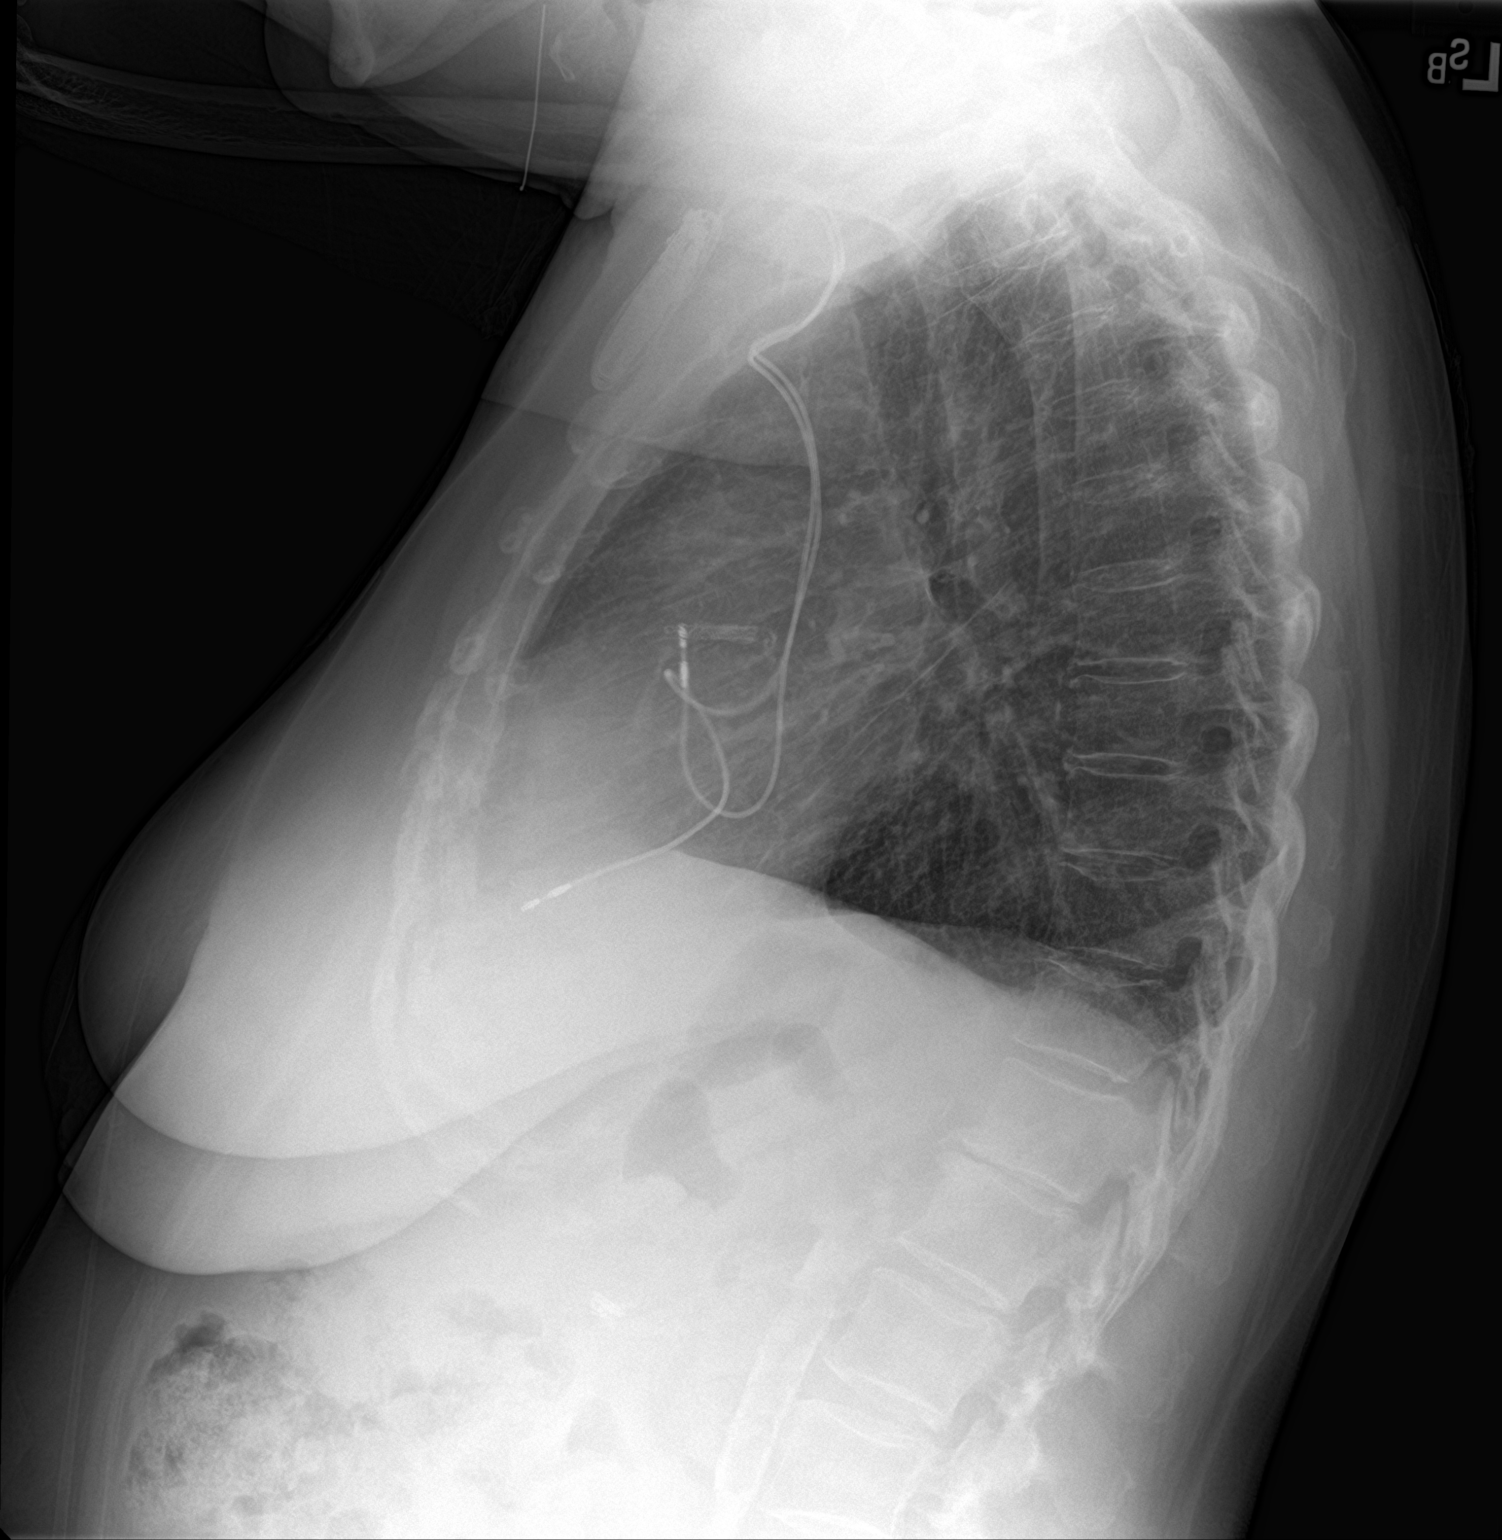

[2 of 2 positions shown; findings below may reference images not displayed]

FINDINGS: No edema or consolidation. Heart size and pulmonary vascularity are
normal. No adenopathy. Pacemaker leads are attached to the right
atrium and right ventricle. No pneumothorax. Postoperative changes
noted in the right shoulder. There is acromioclavicular separation.
There is slight anterior wedging of a lower thoracic vertebral body.
IMPRESSION: No edema or airspace opacity. Stable cardiac silhouette.
Postoperative change right shoulder with chronic acromioclavicular
separation on the right.

## 2020-06-22 IMAGING — DX DG CHEST 1V PORT
1 series · 1 of 1 positions shown · non-contrast
Comparison: 06/12/2019

CLINICAL DATA: Respiratory distress, upper chest pain

EXAM:
PORTABLE CHEST 1 VIEW

[chest ap]
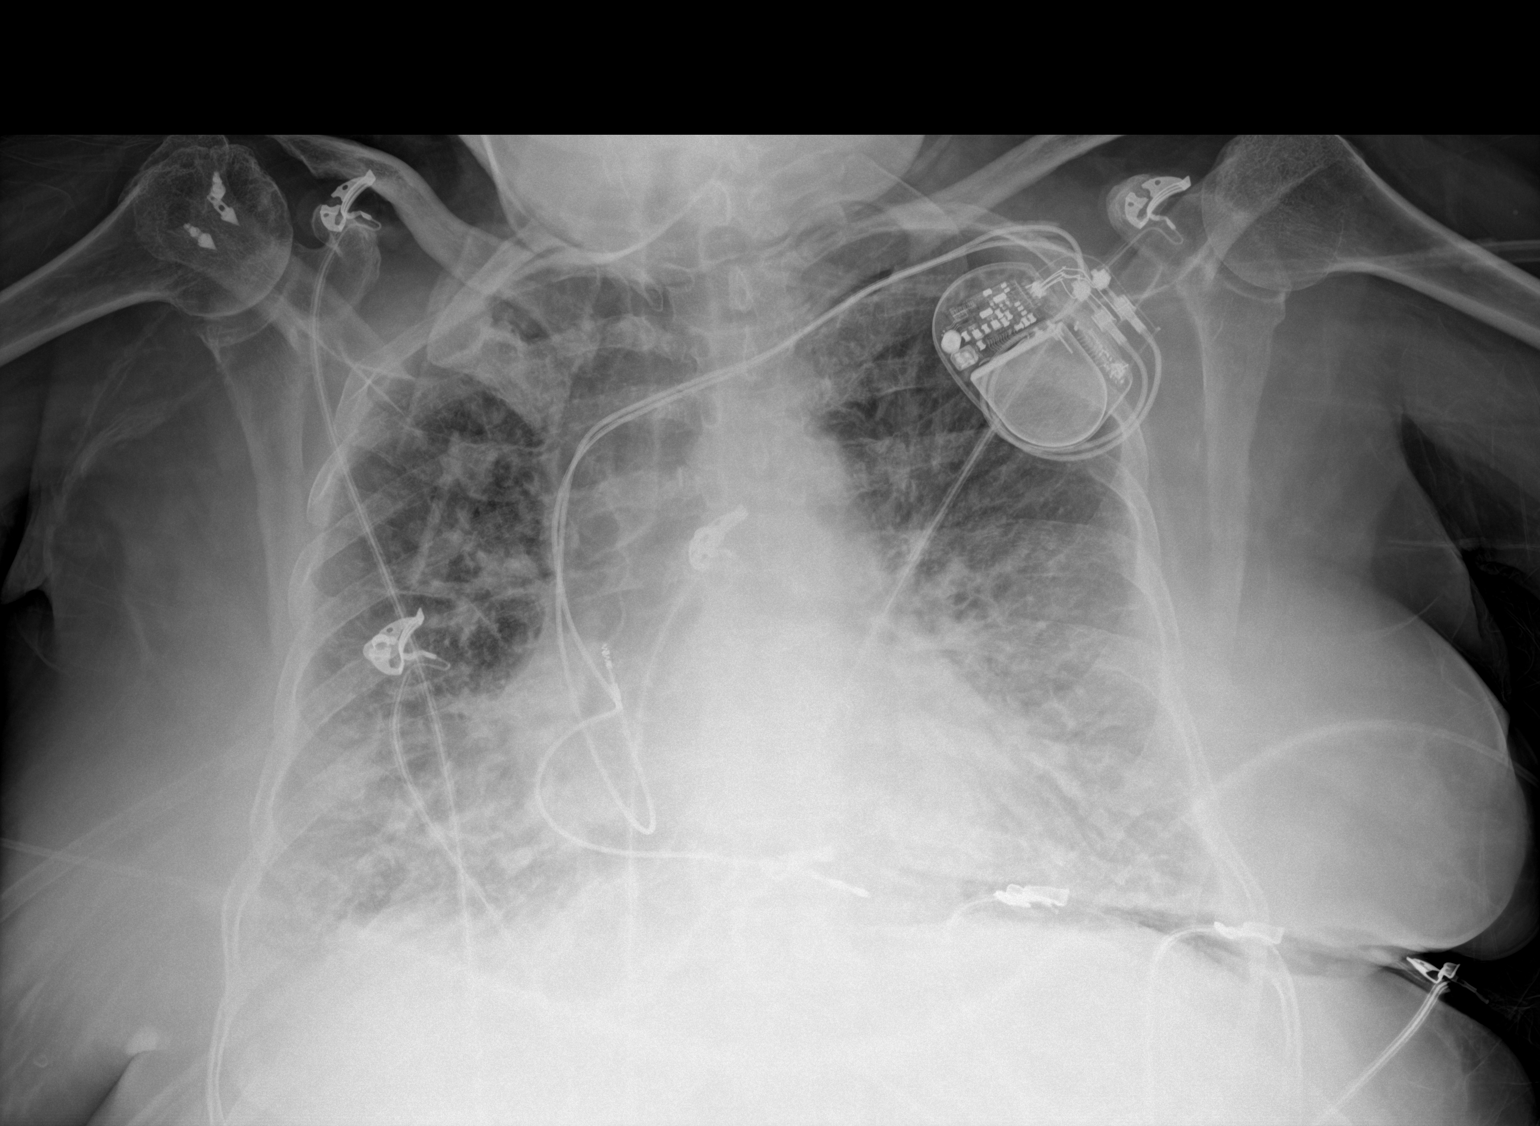

[1 of 1 positions shown; findings below may reference images not displayed]

FINDINGS: Single frontal view of the chest demonstrates stable dual lead
pacemaker. Cardiac silhouette is enlarged. There is vascular
congestion with diffuse interstitial and alveolar opacities
consistent with edema. Small right pleural effusion. No
pneumothorax.
IMPRESSION: 1. Congestive heart failure.

## 2020-06-24 IMAGING — CT CT HEAD W/O CM
4 of 5 series · 16 of 47 positions shown, 18 images · non-contrast
Comparison: 06/12/2019

CLINICAL DATA: Lethargy

EXAM:
CT HEAD WITHOUT CONTRAST
TECHNIQUE: Contiguous axial images were obtained from the base of the skull
through the vertex without intravenous contrast.

[Series 2: head wo · axial · 0.39mm/px · z∈[-89,+11]mm · 5 of 30 slices shown, 7 images]
[im 5/30  brain]
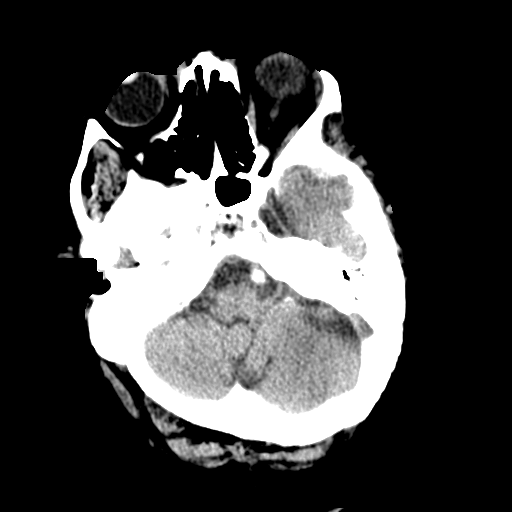
[im 5/30  bone]
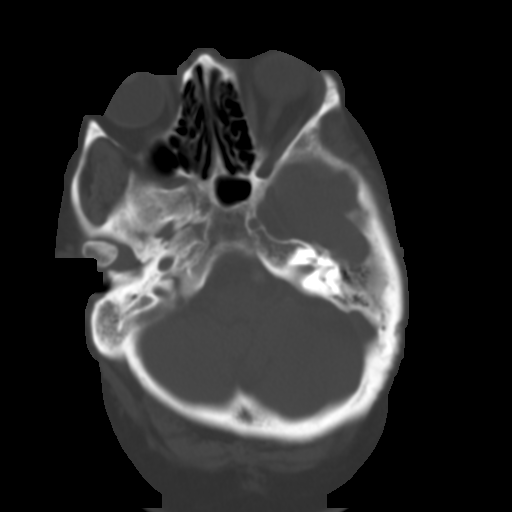
[im 10/30  brain]
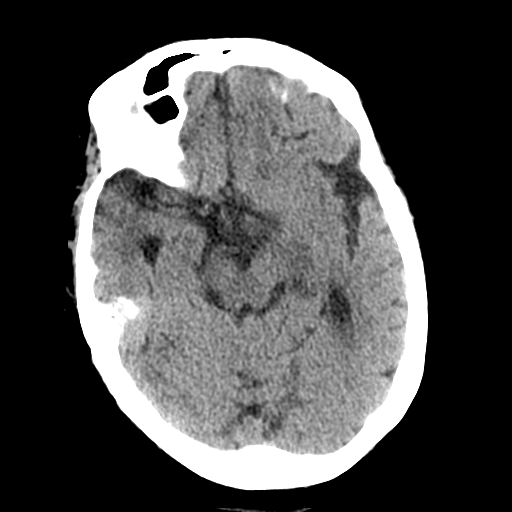
[im 15/30  brain]
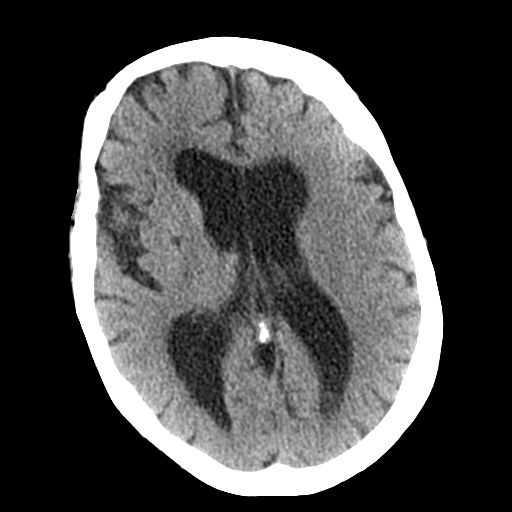
[im 20/30  brain]
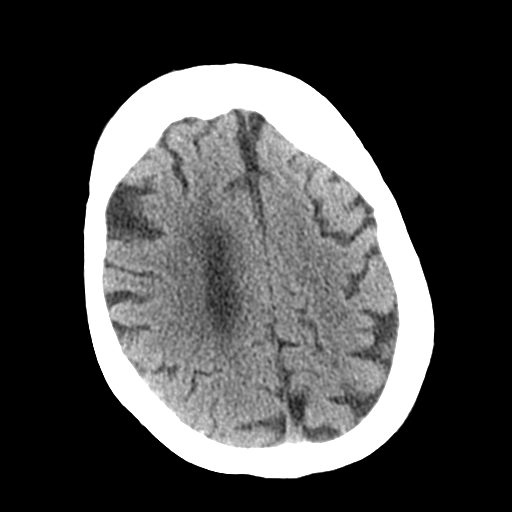
[im 25/30  brain]
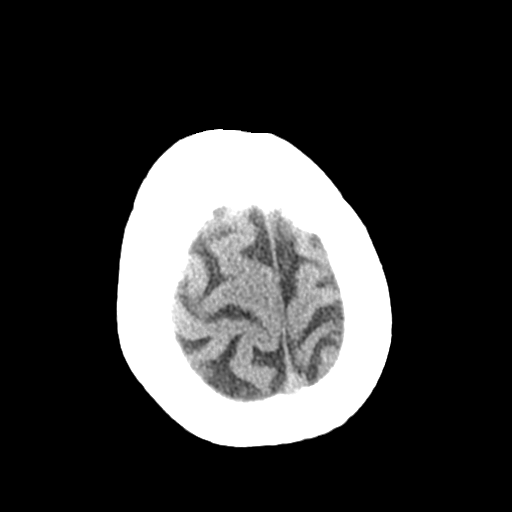
[im 25/30  bone]
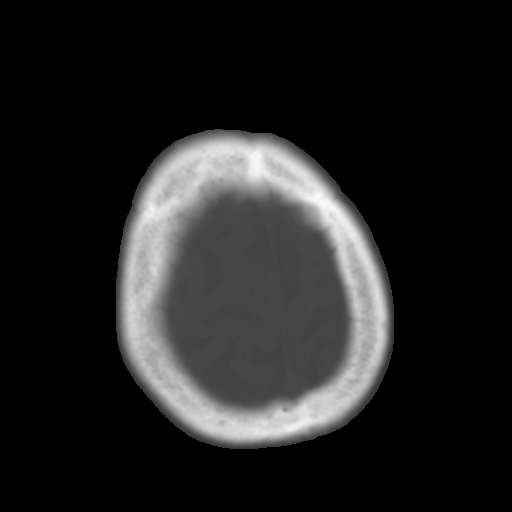

[Series 4: coronal soft tissue · coronal · 0.30mm/px · 3 of 59 slices shown]
[im 20/59  brain]
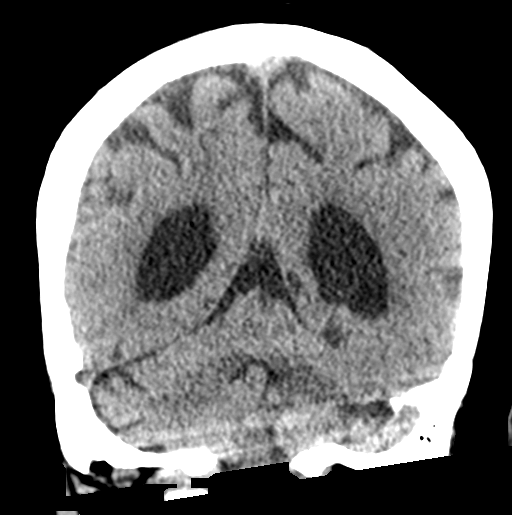
[im 26/59  brain]
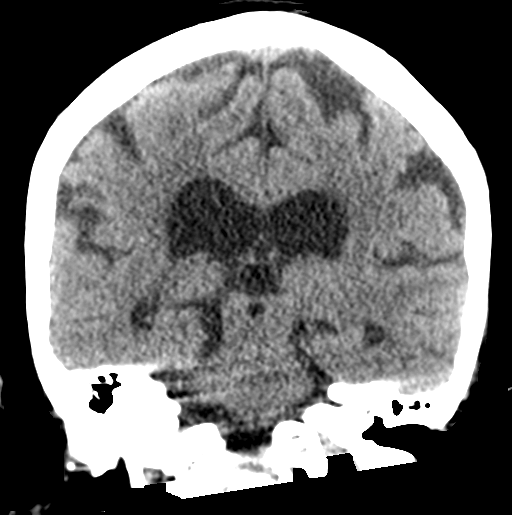
[im 33/59  brain]
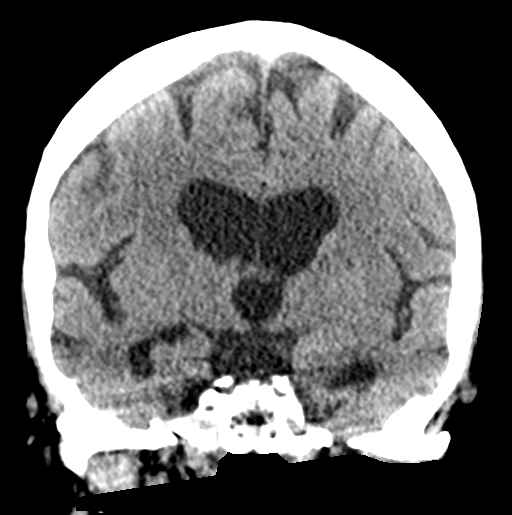

[Series 5: sagittal soft tissue · sagittal · 0.32mm/px · 3 of 48 slices shown]
[im 16/48  brain]
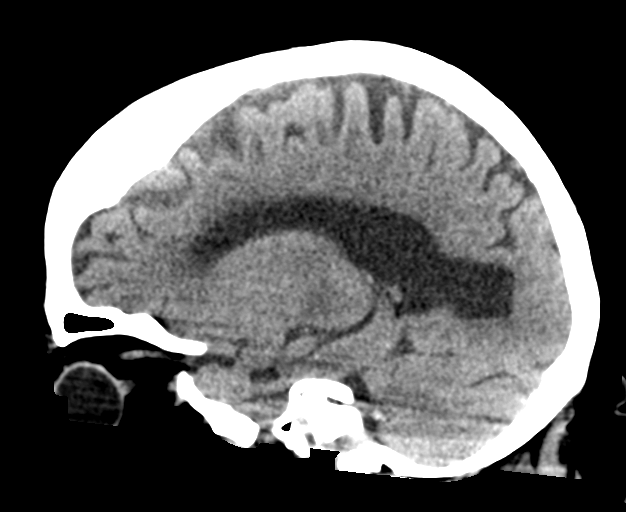
[im 24/48  brain]
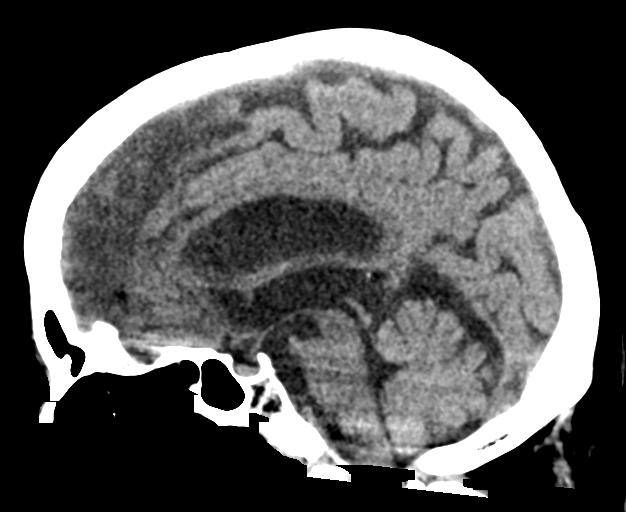
[im 32/48  brain]
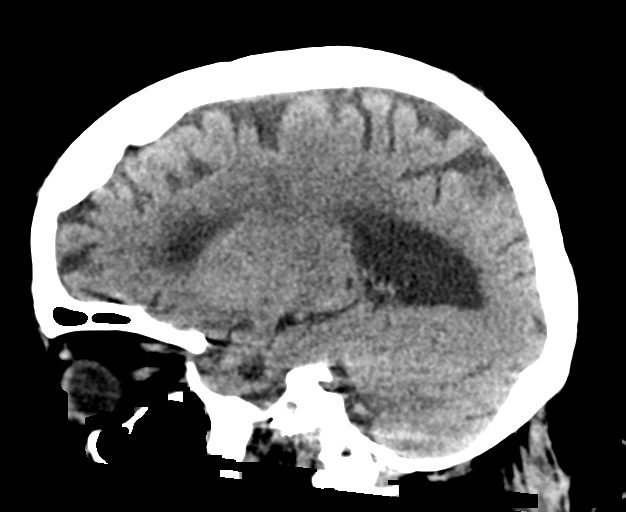

[Series 7: ax head wo bonerecons · axial · 0.31mm/px · z∈[-46,+10]mm · 5 of 67 slices shown]
[im 5/67  brain]
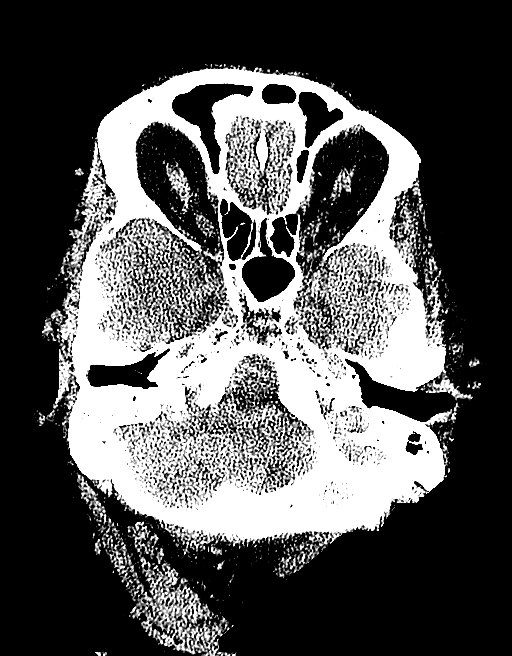
[im 14/67  brain]
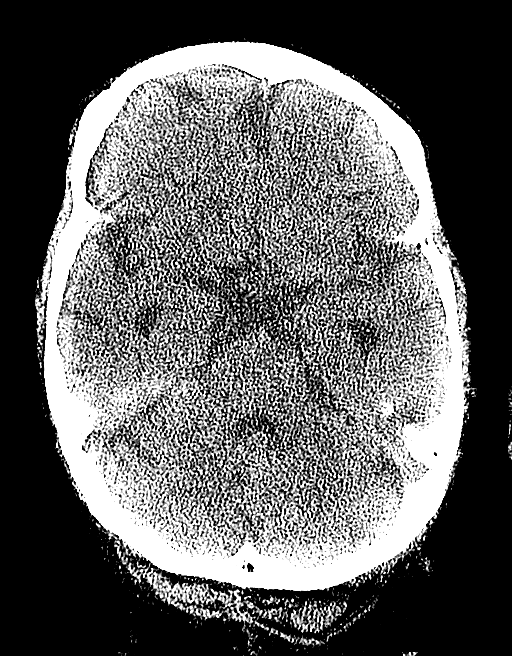
[im 23/67  brain]
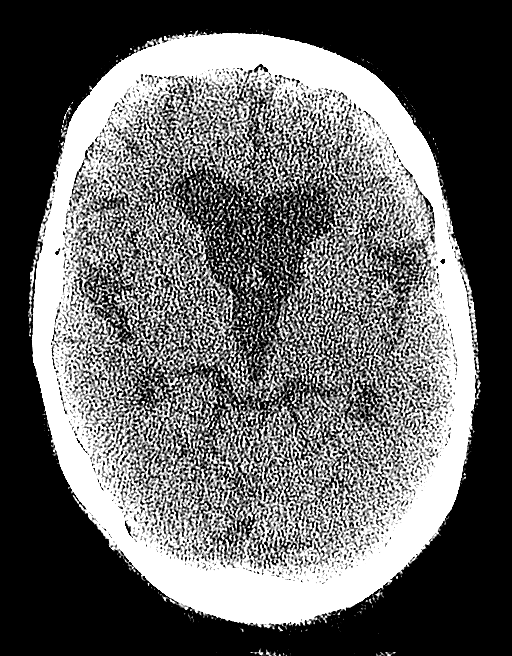
[im 31/67  brain]
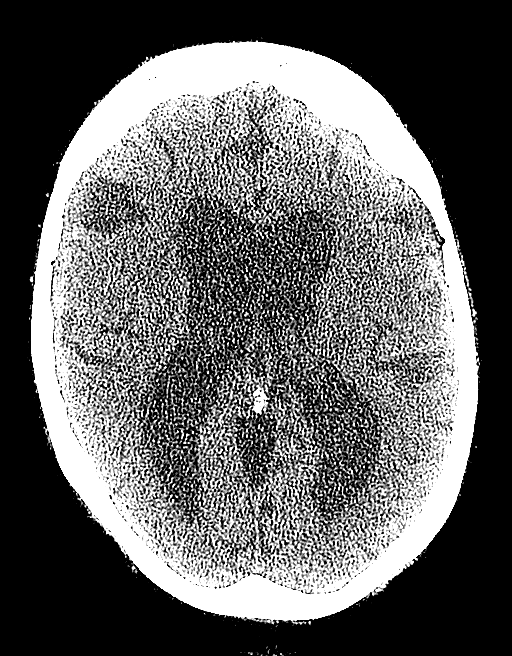
[im 36/67  brain]
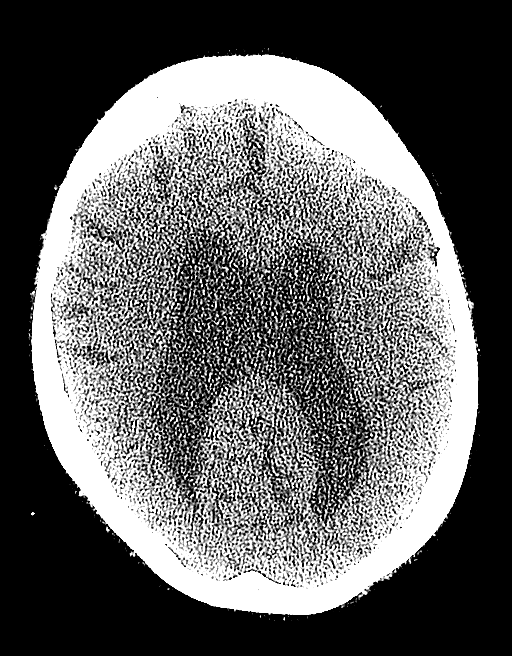

[16 of 47 positions shown; findings below may reference images not displayed]

FINDINGS: Brain: No evidence of acute infarction, hemorrhage, hydrocephalus,
extra-axial collection or mass lesion/mass effect. Remote lacunar
infarct at the right putamen. Cerebral volume loss with
ventriculomegaly.

Vascular: Atherosclerotic plaque.

Skull: Normal. Negative for fracture or focal lesion.

Sinuses/Orbits: No acute finding.
IMPRESSION: 1. No acute or interval finding.
2. Generalized atrophy.

## 2020-06-26 IMAGING — RF DG ESOPHAGUS
11 series · 13 of 13 positions shown · non-contrast
Comparison: None.

CLINICAL DATA: Dysphagia with solids for 6 months

EXAM:
ESOPHOGRAM/BARIUM SWALLOW
TECHNIQUE: Single contrast examination was performed using  thin barium.
FLUOROSCOPY TIME:  Fluoroscopy Time:  0.7 minutes
Radiation Exposure Index (if provided by the fluoroscopic device):
13.1 mGy
Number of Acquired Spot Images: 13

[Series 1: fluoro_barium 2fps_bw · 0.19mm/px · 2 of 2 frames shown (1 of 10)]
[frame 1/2]
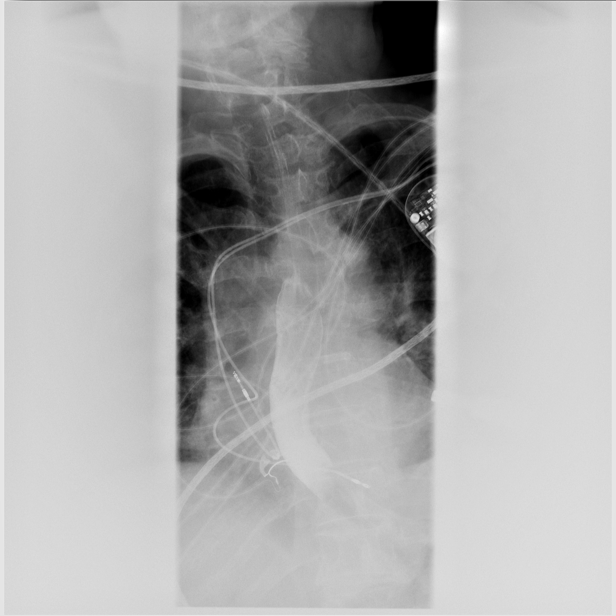
[frame 2/2]
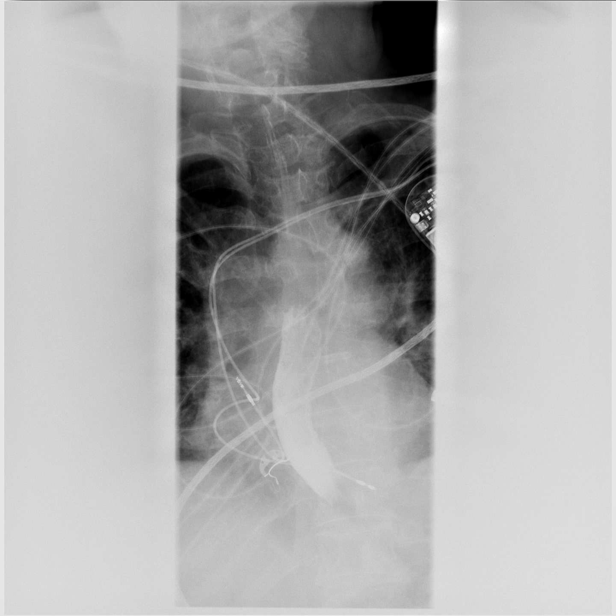

[Series 2: fluoro_barium 2fps_bw · 0.19mm/px · 2 of 2 frames shown (2 of 10)]
[frame 1/2]
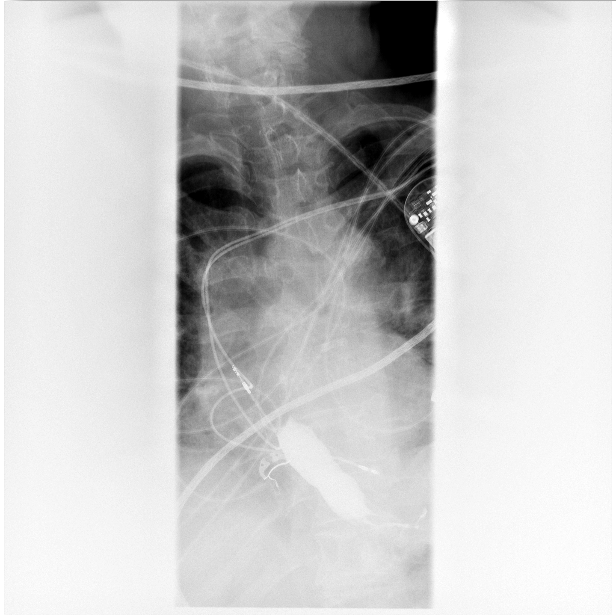
[frame 2/2]
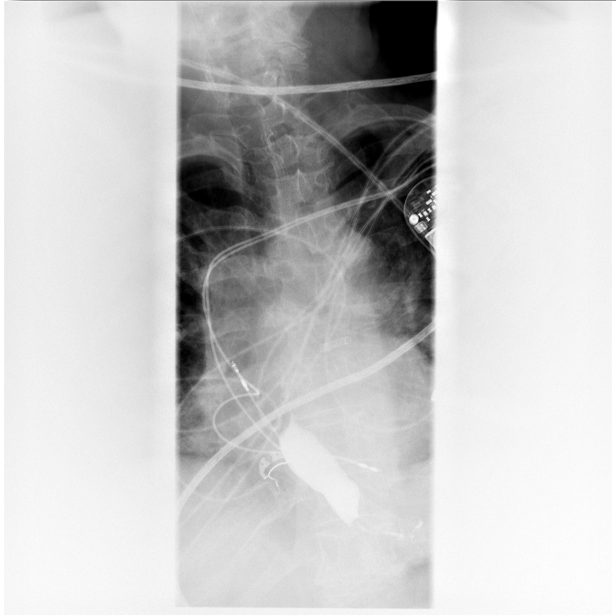

[Series 3: fluoro_barium 2fps_bw · 0.19mm/px · 1 of 1 slices shown (3 of 10)]
[im 1/1]
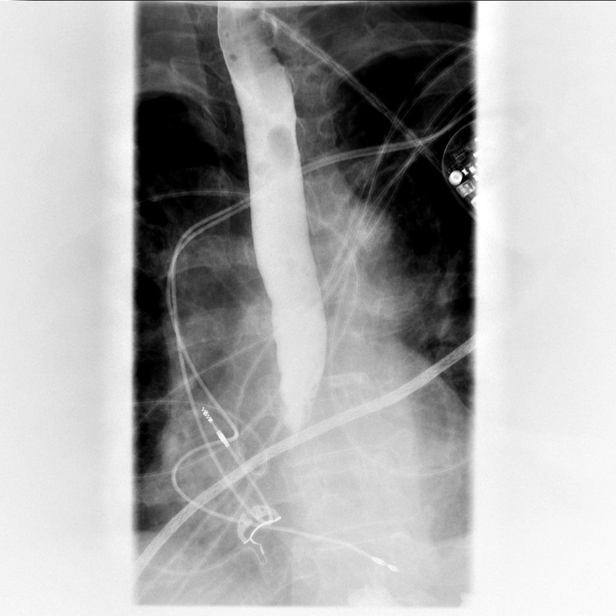

[Series 4: fluoro_barium 2fps_bw · 0.19mm/px · 1 of 1 slices shown (4 of 10)]
[im 1/1]
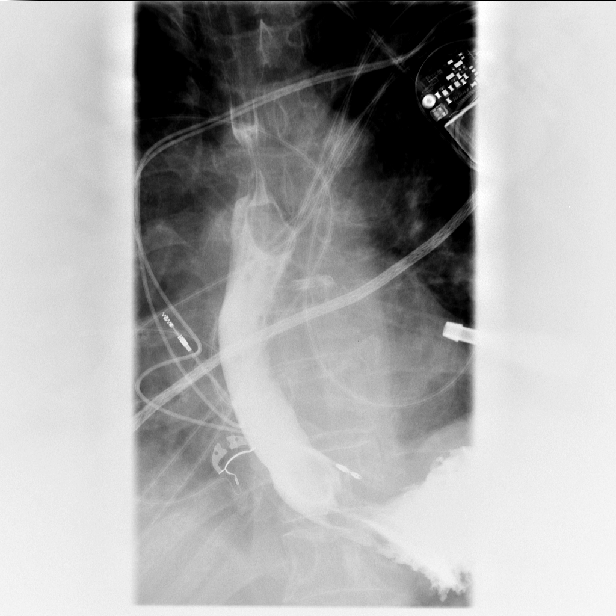

[Series 5: fluoro_barium 2fps_bw · 0.19mm/px · 1 of 1 slices shown (5 of 10)]
[im 1/1]
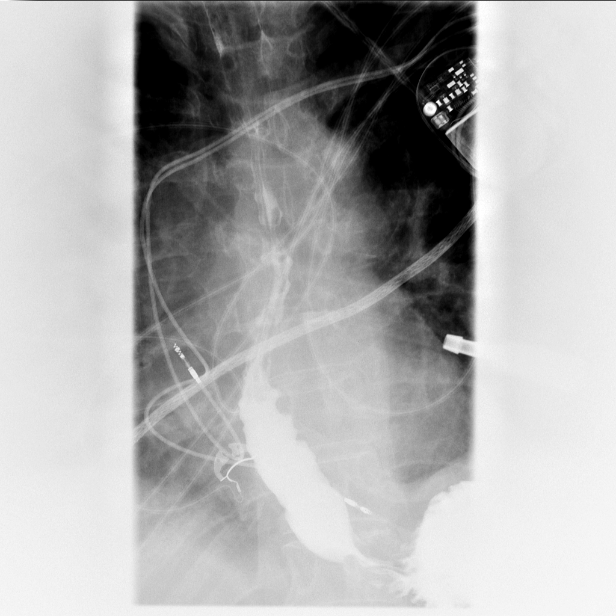

[Series 6: cp_standard · 0.19mm/px · 1 of 1 slices shown]
[im 1/1]
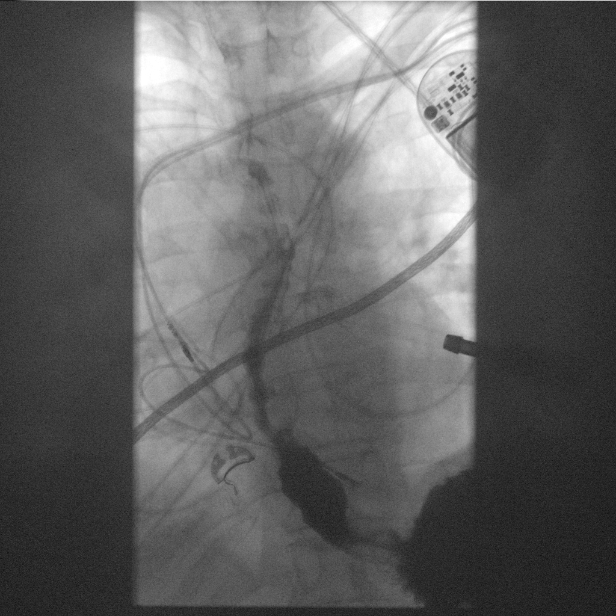

[Series 7: fluoro_barium 2fps_bw · 0.19mm/px · 1 of 1 slices shown (6 of 10)]
[im 1/1]
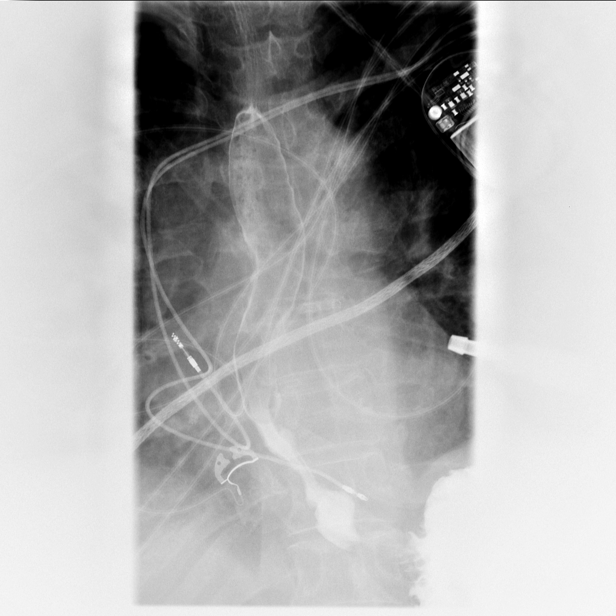

[Series 9: fluoro_barium 2fps_bw · 0.19mm/px · 1 of 1 slices shown (7 of 10)]
[im 1/1]
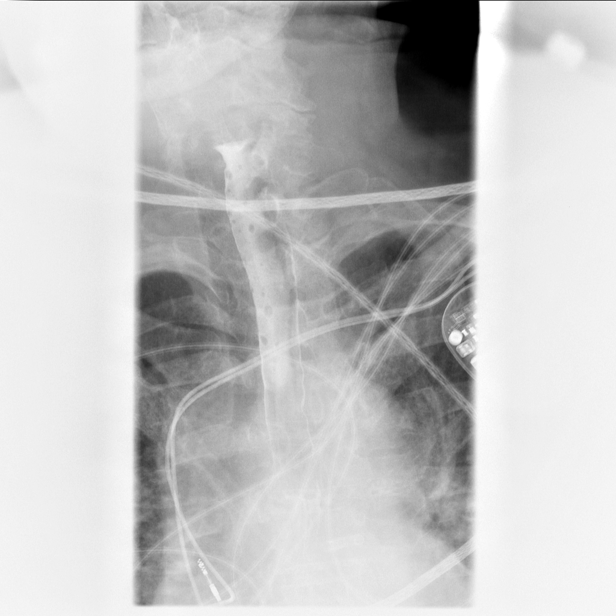

[Series 10: fluoro_barium 2fps_bw · 0.19mm/px · 1 of 1 slices shown (8 of 10)]
[im 1/1]
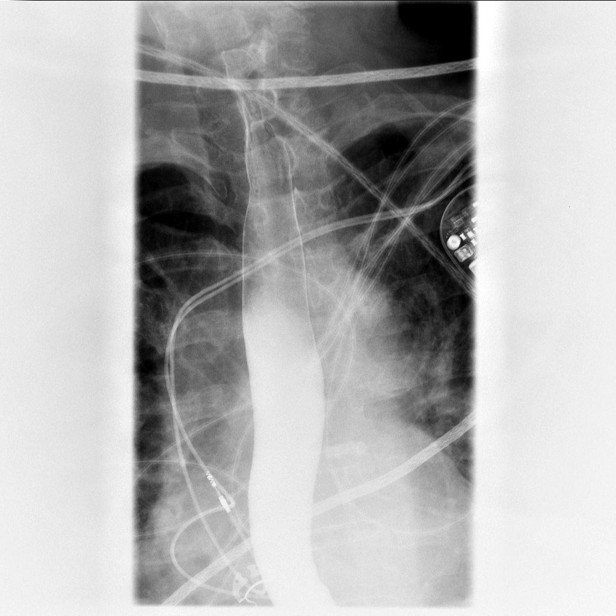

[Series 11: fluoro_barium 2fps_bw · 0.19mm/px · 1 of 1 slices shown (9 of 10)]
[im 1/1]
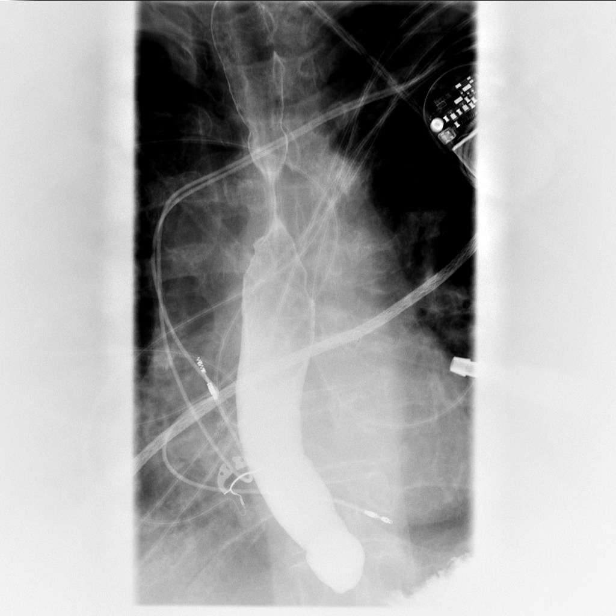

[Series 12: fluoro_barium 2fps_bw · 0.19mm/px · 1 of 1 slices shown (10 of 10)]
[im 1/1]
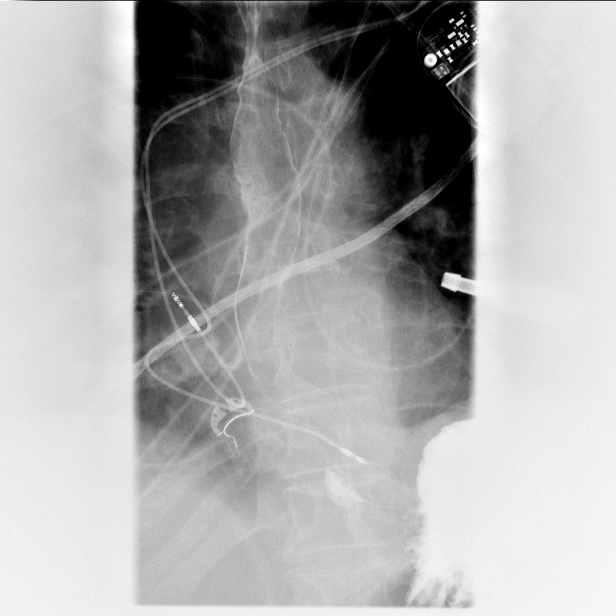

[13 of 13 positions shown; findings below may reference images not displayed]

FINDINGS: Decreased primary peristaltic wave with intermittent tertiary
contractions. Esophagus distends normally. No persisting stricture.
No discrete mass or ulceration. Spontaneous gastroesophageal reflux
to the midesophagus. Small hiatal hernia.
IMPRESSION: 1. No evidence for esophageal stricture.
2. Mild esophageal dysmotility.
3. Gastroesophageal reflux.

## 2020-07-22 IMAGING — DX DG HAND COMPLETE 3+V*R*
3 series · 3 of 3 positions shown · non-contrast
Comparison: Right hand x-rays dated December 27, 2016.

CLINICAL DATA: Right hand pain for the past month.  No injury.

EXAM:
RIGHT HAND - COMPLETE 3+ VIEW

[hand ap]
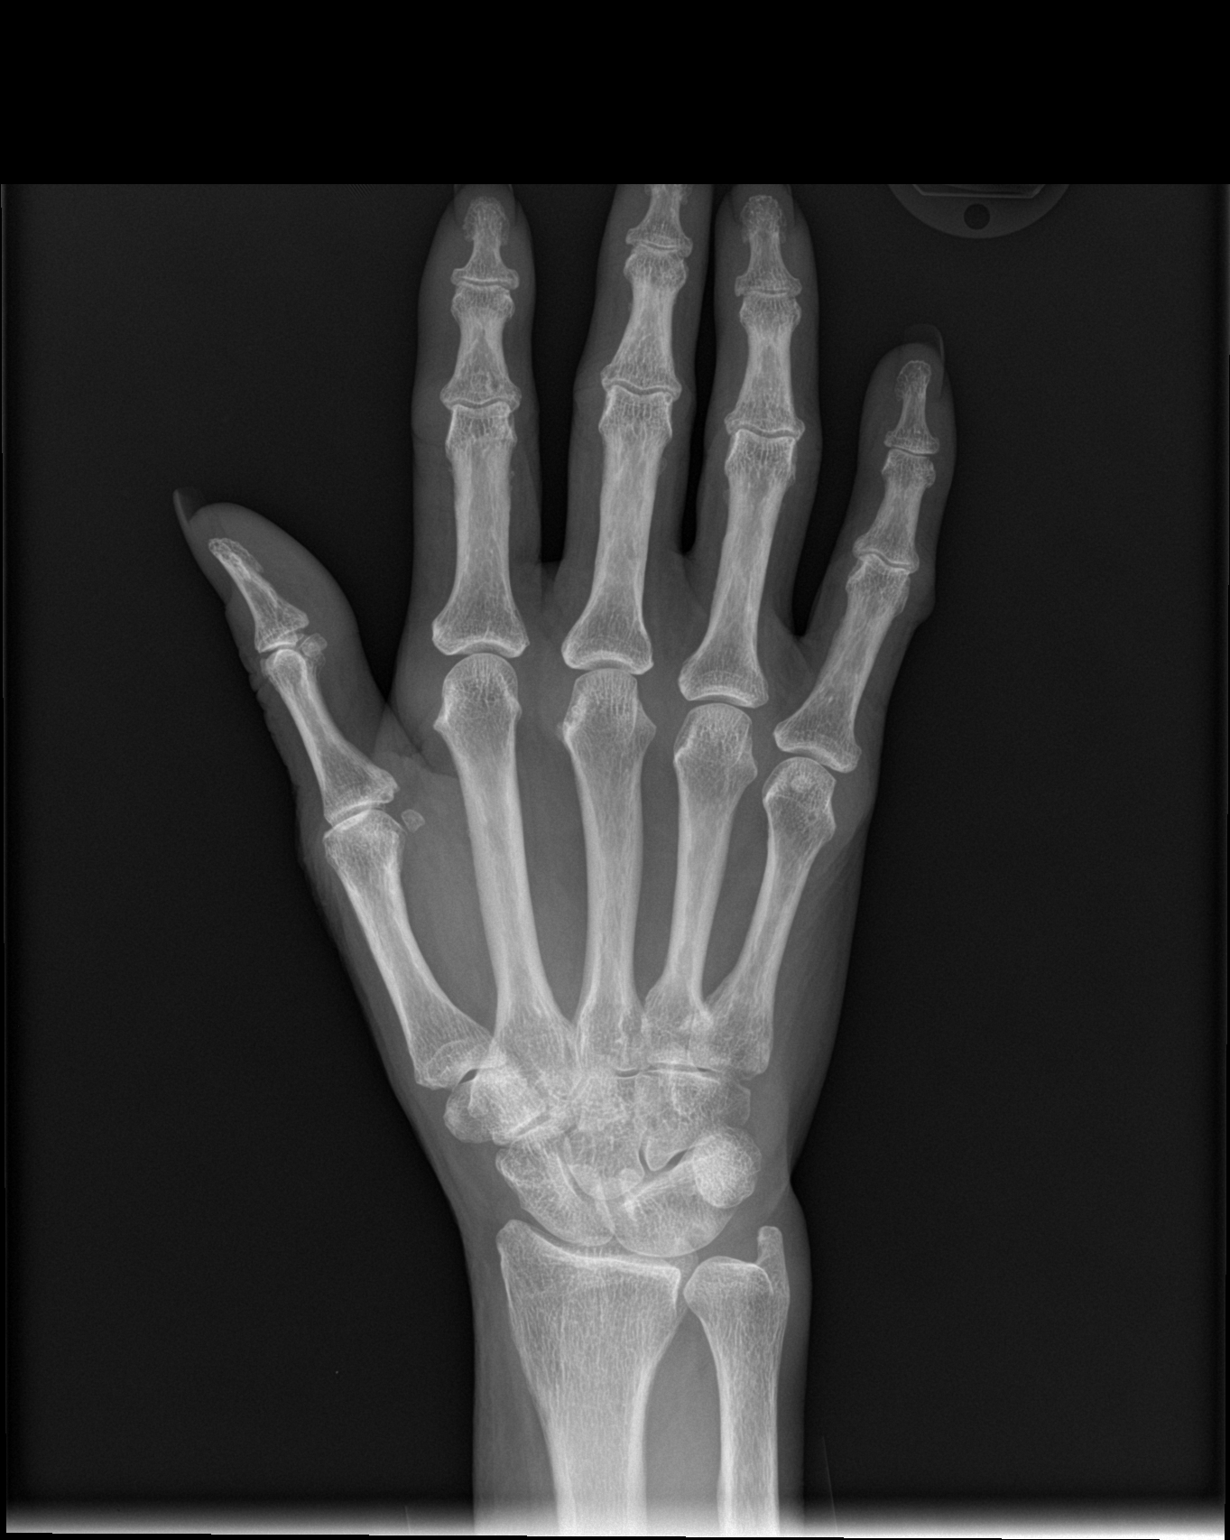

[hand obl]
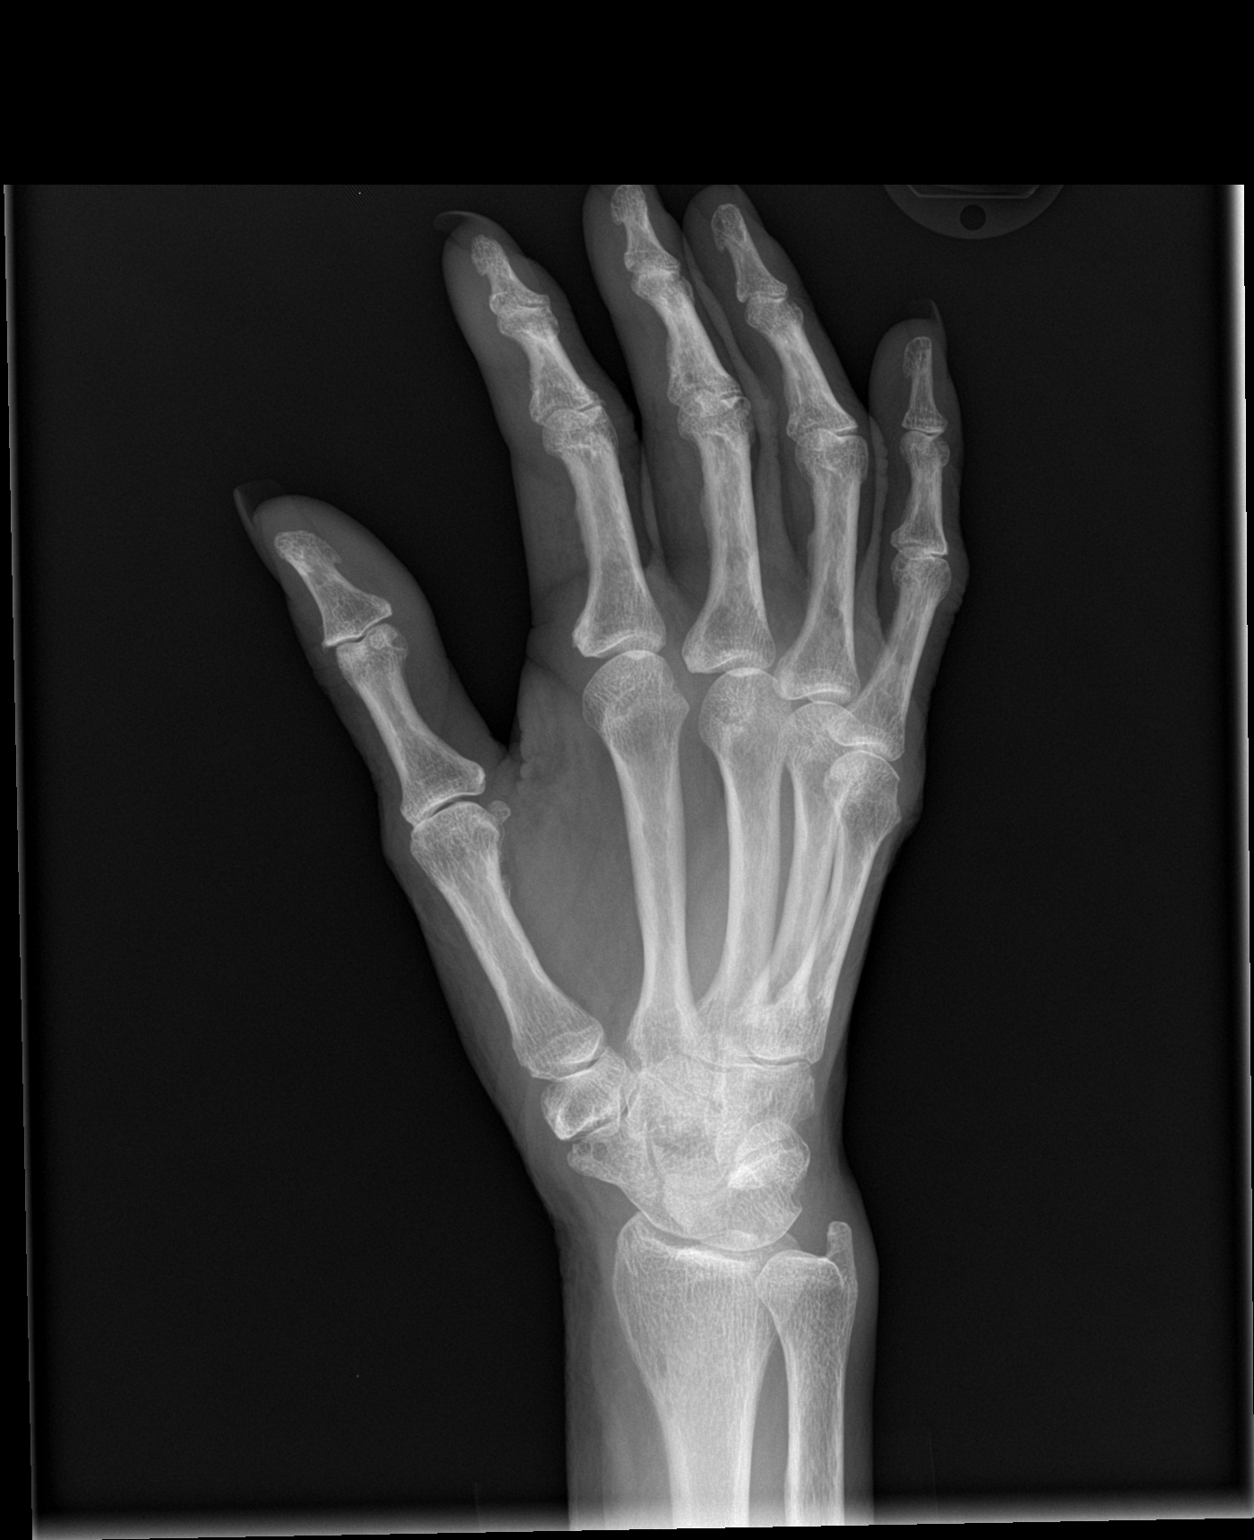

[hand lat]
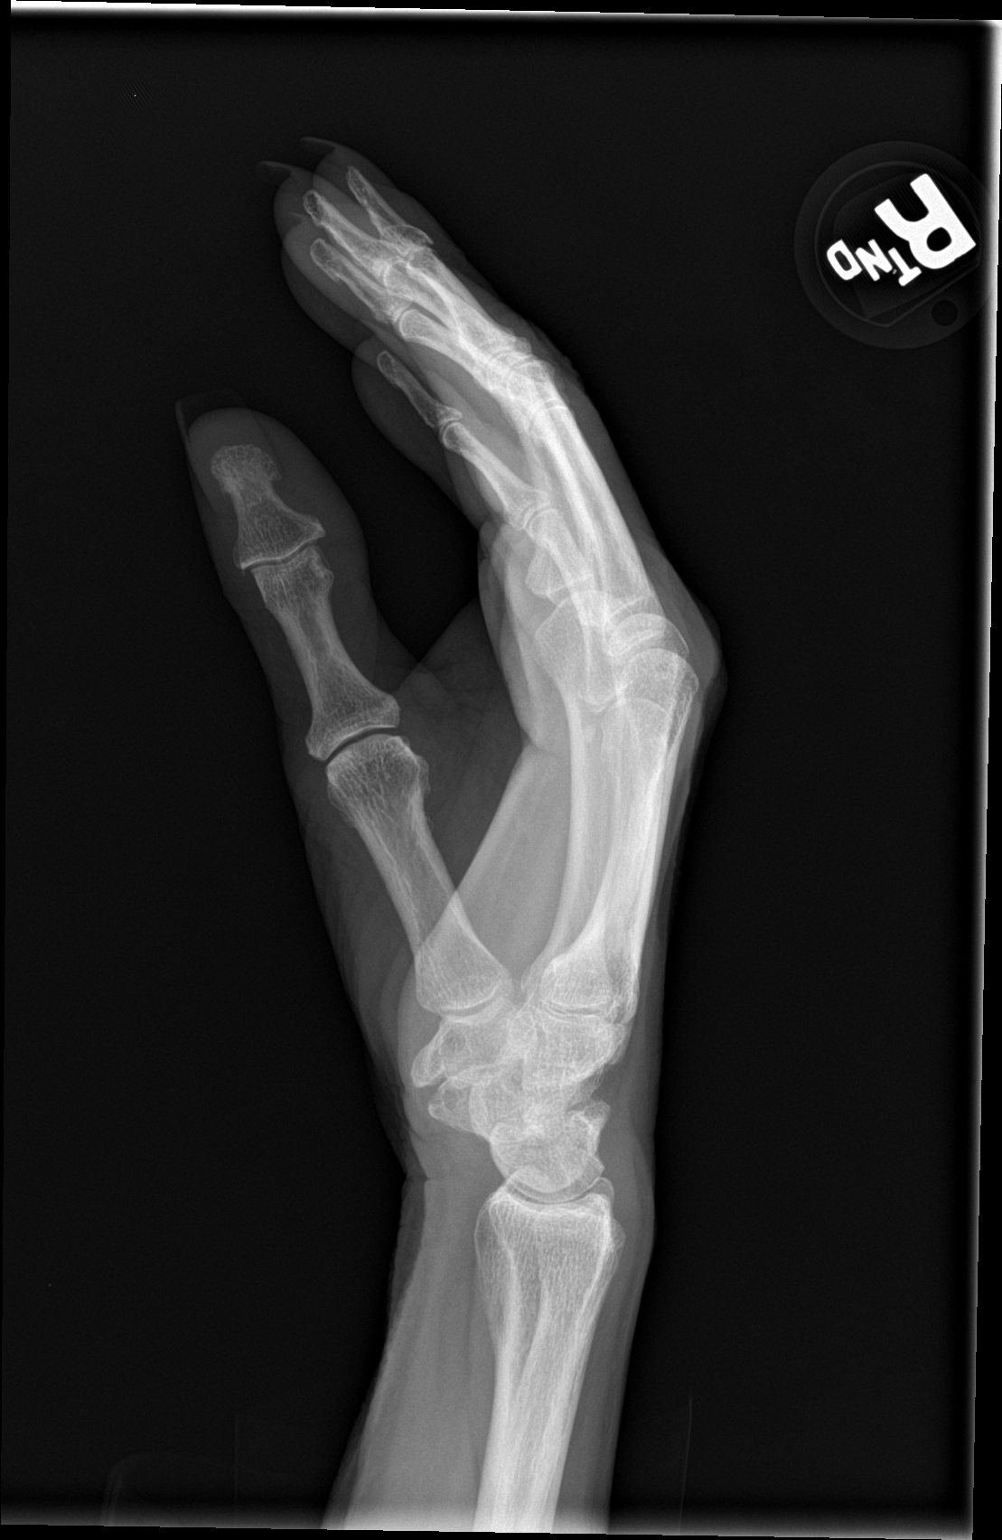

[3 of 3 positions shown; findings below may reference images not displayed]

FINDINGS: There is no evidence of fracture or dislocation. There is no
evidence of arthropathy or other focal bone abnormality. Soft
tissues are unremarkable.
IMPRESSION: Negative.

## 2020-08-05 ENCOUNTER — Emergency Department
Admission: EM | Admit: 2020-08-05 | Discharge: 2020-08-05 | Disposition: A | Discharge status: Home / Self Care | Payer: No Typology Code available for payment source | Attending: Emergency Medicine | Admitting: Emergency Medicine

## 2020-08-05 ENCOUNTER — Other Ambulatory Visit: Payer: Self-pay

## 2020-08-05 ENCOUNTER — Emergency Department: Payer: No Typology Code available for payment source

## 2020-08-05 DIAGNOSIS — Z7984 Long term (current) use of oral hypoglycemic drugs: Secondary | ICD-10-CM | POA: Diagnosis not present

## 2020-08-05 DIAGNOSIS — R42 Dizziness and giddiness: Secondary | ICD-10-CM

## 2020-08-05 DIAGNOSIS — E039 Hypothyroidism, unspecified: Secondary | ICD-10-CM | POA: Insufficient documentation

## 2020-08-05 DIAGNOSIS — Z950 Presence of cardiac pacemaker: Secondary | ICD-10-CM | POA: Insufficient documentation

## 2020-08-05 DIAGNOSIS — Y92129 Unspecified place in nursing home as the place of occurrence of the external cause: Secondary | ICD-10-CM | POA: Insufficient documentation

## 2020-08-05 DIAGNOSIS — I5032 Chronic diastolic (congestive) heart failure: Secondary | ICD-10-CM | POA: Insufficient documentation

## 2020-08-05 DIAGNOSIS — J449 Chronic obstructive pulmonary disease, unspecified: Secondary | ICD-10-CM | POA: Insufficient documentation

## 2020-08-05 DIAGNOSIS — Z7982 Long term (current) use of aspirin: Secondary | ICD-10-CM | POA: Diagnosis not present

## 2020-08-05 DIAGNOSIS — I6523 Occlusion and stenosis of bilateral carotid arteries: Secondary | ICD-10-CM

## 2020-08-05 DIAGNOSIS — Z8616 Personal history of COVID-19: Secondary | ICD-10-CM | POA: Diagnosis not present

## 2020-08-05 DIAGNOSIS — I110 Hypertensive heart disease with heart failure: Secondary | ICD-10-CM | POA: Insufficient documentation

## 2020-08-05 DIAGNOSIS — W01198A Fall on same level from slipping, tripping and stumbling with subsequent striking against other object, initial encounter: Secondary | ICD-10-CM | POA: Diagnosis not present

## 2020-08-05 DIAGNOSIS — Z79899 Other long term (current) drug therapy: Secondary | ICD-10-CM | POA: Insufficient documentation

## 2020-08-05 DIAGNOSIS — F17210 Nicotine dependence, cigarettes, uncomplicated: Secondary | ICD-10-CM | POA: Diagnosis not present

## 2020-08-05 DIAGNOSIS — I2510 Atherosclerotic heart disease of native coronary artery without angina pectoris: Secondary | ICD-10-CM | POA: Diagnosis not present

## 2020-08-05 DIAGNOSIS — E119 Type 2 diabetes mellitus without complications: Secondary | ICD-10-CM | POA: Diagnosis not present

## 2020-08-05 DIAGNOSIS — W19XXXA Unspecified fall, initial encounter: Secondary | ICD-10-CM

## 2020-08-05 LAB — BASIC METABOLIC PANEL
Anion gap: 10 (ref 5–15)
BUN: 28 mg/dL — ABNORMAL HIGH (ref 8–23)
CO2: 23 mmol/L (ref 22–32)
Calcium: 9.5 mg/dL (ref 8.9–10.3)
Chloride: 100 mmol/L (ref 98–111)
Creatinine, Ser: 1.47 mg/dL — ABNORMAL HIGH (ref 0.44–1.00)
GFR, Estimated: 37 mL/min — ABNORMAL LOW (ref >60)
Glucose, Bld: 156 mg/dL — ABNORMAL HIGH (ref 70–99)
Potassium: 4.6 mmol/L (ref 3.5–5.1)
Sodium: 133 mmol/L — ABNORMAL LOW (ref 135–145)

## 2020-08-05 LAB — CBC WITH DIFFERENTIAL/PLATELET
Abs Immature Granulocytes: 0.05 10*3/uL (ref 0.00–0.07)
Basophils Absolute: 0.1 10*3/uL (ref 0.0–0.1)
Basophils Relative: 1 %
Eosinophils Absolute: 0.1 10*3/uL (ref 0.0–0.5)
Eosinophils Relative: 1 %
HCT: 34.9 % — ABNORMAL LOW (ref 36.0–46.0)
Hemoglobin: 11.5 g/dL — ABNORMAL LOW (ref 12.0–15.0)
Immature Granulocytes: 1 %
Lymphocytes Relative: 15 %
Lymphs Abs: 1.4 10*3/uL (ref 0.7–4.0)
MCH: 30.2 pg (ref 26.0–34.0)
MCHC: 33 g/dL (ref 30.0–36.0)
MCV: 91.6 fL (ref 80.0–100.0)
Monocytes Absolute: 0.6 10*3/uL (ref 0.1–1.0)
Monocytes Relative: 7 %
Neutro Abs: 7 10*3/uL (ref 1.7–7.7)
Neutrophils Relative %: 75 %
Platelets: 390 10*3/uL (ref 150–400)
RBC: 3.81 MIL/uL — ABNORMAL LOW (ref 3.87–5.11)
RDW: 17 % — ABNORMAL HIGH (ref 11.5–15.5)
WBC: 9.1 10*3/uL (ref 4.0–10.5)
nRBC: 0 % (ref 0.0–0.2)

## 2020-08-05 LAB — TROPONIN I (HIGH SENSITIVITY)
Troponin I (High Sensitivity): 24 ng/L — ABNORMAL HIGH (ref <18)
Troponin I (High Sensitivity): 22 ng/L — ABNORMAL HIGH (ref <18)

## 2020-08-05 NOTE — ED Notes (Signed)
Pt still in CT and xray. Will obtain VS and complete triage process when pt returns. EDP evaluated pt before pt went to imaging.

## 2020-08-05 NOTE — ED Notes (Signed)
Helped pt ambulate to hall bathroom. Pt walked with steady gait and hardly needed assistance. Pt voided. Pt back in bed.

## 2020-08-05 NOTE — ED Notes (Signed)
[facility] will pick up pt. No [facility] transport.

## 2020-08-05 NOTE — ED Provider Notes (Signed)
[facility] Emergency Department Provider Note   ____________________________________________   Event Date/Time   First MD Initiated Contact with Patient 08/05/20 [phone]     (approximate)  I have reviewed the triage vital signs and the nursing notes.   HISTORY  Chief Complaint Fall, Dizziness   HPI Melanie King is a 75 y.o. female with past medical history of hypertension, hyperlipidemia, CAD, CHF, COPD, sick sinus syndrome status post pacemaker, and chronic pain syndrome who presents to the ED for fall.  Patient resides at the [facility] facility and states she was standing at the nurses desk when she started to feel very dizzy and lightheaded.  She lost her balance and fell backwards, striking her head.  She is not sure whether she lost consciousness, no blood thinners noted on paperwork from nursing facility.  Patient now complains of pain at the back of her head and in her neck, additionally complains of pain in her upper and lower back.  She denies any pain in her extremities, chest, or abdomen.  She denies any chest pain or shortness of breath associated with the dizziness.  She is no longer feeling dizzy and denies any numbness or weakness.        Past Medical History:  Diagnosis Date  . Arthritis   . CHF (congestive heart failure) (HCC)   . COPD (chronic obstructive pulmonary disease) (HCC)   . Coronary arteriosclerosis   . DDD (degenerative disc disease), cervical   . DDD (degenerative disc disease), lumbar   . Depression   . Diabetes mellitus without complication (HCC)   . GERD (gastroesophageal reflux disease)   . Hyperlipidemia   . Hypertension   . Osteoarthritis   . Pacemaker   . Tremor     Patient Active Problem List   Diagnosis Date Noted  . Carotid stenosis 04/12/2020  . Dysphagia 07/19/2019  . Acute respiratory failure due to COVID-19 (HCC) 07/16/2019  . Respiratory distress   . Acute on chronic congestive heart failure  (HCC)   . Acute respiratory failure (HCC) 07/15/2019  . Frequent falls 05/26/2019  . Generalized weakness 05/26/2019  . AKI (acute kidney injury) (HCC) 03/21/2019  . Subdural hematoma (HCC) 03/20/2019  . Cardiac pacemaker 03/17/2019  . Hypothyroidism 03/10/2019  . Depression   . Chronic diastolic CHF (congestive heart failure) (HCC)   . Acute metabolic encephalopathy   . Fall   . Tobacco abuse   . C6 cervical fracture (HCC)   . Left hand pain 04/01/2018  . Numbness and tingling in left hand 04/01/2018  . Left hand weakness 03/05/2018  . Confusion 05/12/2017  . Seizure (HCC) 05/10/2017  . Elevated sedimentation rate 04/04/2017  . Elevated C-reactive protein (CRP) 04/04/2017  . Chest pain, unspecified 04/02/2017  . Bilateral lower extremity pain (Secondary Area of Pain) (R>L) 04/02/2017  . Chronic hip pain, bilateral  (Secondary Area of Pain) (R>L) 04/02/2017  . Chronic bilateral low back pain with bilateral sciatica [facility] Area of Pain) (R>L) 04/02/2017  . Chronic pain syndrome 04/02/2017  . Disorder of bone, unspecified 04/02/2017  . Other long term (current) drug therapy 04/02/2017  . Other specified health status 04/02/2017  . Long term current use of opiate analgesic 04/02/2017  . Hip pain, bilateral 12/06/2016  . Tremor 05/27/2016  . Trigger finger of left hand 02/16/2016  . Osteoarthritis of both hands 02/16/2016  . Angina pectoris (HCC) 01/17/2016  . COPD (chronic obstructive pulmonary disease) (HCC) 01/17/2016  . CAD (coronary artery disease)  01/17/2016  . HLD (hyperlipidemia) 01/17/2016  . Hypertension 01/17/2016  . Sick sinus syndrome (HCC) 01/17/2016  . Chronic pain 01/17/2016  . Personal history of tobacco use, presenting hazards to health 11/16/2015  . Type 2 diabetes mellitus without complication, with long-term current use of insulin (HCC) 05/12/2013    Past Surgical History:  Procedure Laterality Date  . ABDOMINAL HYSTERECTOMY    . BREAST BIOPSY Right  1994   neg cyst removed  . CHOLECYSTECTOMY    . EYE SURGERY  1964, 1966, and 1967   bilateral  . FOOT SURGERY Right    cellulitis  . PACEMAKER INSERTION    . PPM GENERATOR CHANGEOUT N/A 02/24/2020   Procedure: PPM GENERATOR CHANGEOUT;  Surgeon: Marcina Millard, MD;  Location: [facility] INVASIVE CV LAB;  Service: Cardiovascular;  Laterality: N/A;  . SPINE SURGERY     cyst removed; [facility]    Prior to Admission medications   Medication Sig Start Date End Date Taking? Authorizing Provider  acetaminophen (TYLENOL) 500 MG tablet Take 1,000 mg by mouth every 6 (six) hours as needed for mild pain or moderate pain.    [provider]  amLODipine (NORVASC) 10 MG tablet TAKE 1 TABLET BY MOUTH EVERY DAY Patient taking differently: Take 10 mg by mouth daily. 08/28/16   Gabriel Cirri, NP  aspirin EC 81 MG tablet Take 81 mg by mouth daily.    [provider]  benztropine (COGENTIN) 0.5 MG tablet Take 0.5 mg by mouth 2 (two) times daily.    [provider]  brexpiprazole (REXULTI) 2 MG TABS tablet Take 1 mg by mouth at bedtime.    [provider]  calcitRIOL (ROCALTROL) 0.25 MCG capsule Take 0.25 mcg by mouth daily.    [provider]  cephALEXin (KEFLEX) 250 MG capsule Take 2 capsules (500 mg total) by mouth 2 (two) times daily. Patient not taking: Reported on 04/12/2020 02/24/20   Marcina Millard, MD  cholecalciferol (VITAMIN D3) 25 MCG (1000 UNIT) tablet Take 400 Units by mouth daily.     [provider]  citalopram (CELEXA) 20 MG tablet Take 20 mg by mouth daily.    [provider]  fenofibrate (TRICOR) 48 MG tablet Take 48 mg by mouth daily.    [provider]  fluticasone (FLONASE) 50 MCG/ACT nasal spray Place 2 sprays into both nostrils daily.    [provider]  furosemide (LASIX) 20 MG tablet Take 20 mg by mouth daily.    [provider]  gabapentin (NEURONTIN) 300 MG capsule Take 300 mg by mouth  daily.    [provider]  hydrocortisone cream 1 % 1 application 2 (two) times daily. Rectally Patient not taking: No sig reported    [provider]  levothyroxine (SYNTHROID) 125 MCG tablet Take 1 tablet (125 mcg total) by mouth daily before breakfast. Patient taking differently: Take 150 mcg by mouth daily before breakfast. 05/28/19   Dhungel, Nishant, MD  losartan (COZAAR) 50 MG tablet Take 50 mg by mouth daily.    [provider]  magnesium oxide (MAG-OX) 400 MG tablet Take 400 mg by mouth daily.    [provider]  metFORMIN (GLUCOPHAGE) 1000 MG tablet Take 0.5 tablets (500 mg total) by mouth 2 (two) times daily with a meal. 05/28/19   Dhungel, Nishant, MD  metoprolol tartrate (LOPRESSOR) 25 MG tablet Take 25 mg by mouth 2 (two) times daily.    [provider]  montelukast (SINGULAIR) 10 MG tablet Take  10 mg by mouth daily.    [provider]  nitroGLYCERIN (NITRODUR - DOSED IN MG/24 HR) 0.2 mg/hr patch Place 0.2 mg onto the skin daily. leave patch on 12-14 hours, then remove for 10-12 hours prior to applying the next patch     [provider]  pantoprazole (PROTONIX) 40 MG tablet Take 40 mg by mouth daily.    [provider]  primidone (MYSOLINE) 50 MG tablet Take 50 mg by mouth 2 (two) times daily.    [provider]  senna-docusate (SENOKOT-S) 8.6-50 MG tablet Take 2 tablets by mouth daily.    [provider]  sitaGLIPtin (JANUVIA) 50 MG tablet Take 50 mg by mouth daily.    [provider]  traZODone (DESYREL) 50 MG tablet Take by mouth at bedtime as needed for sleep.    [provider]  vitamin B-12 (CYANOCOBALAMIN) 1000 MCG tablet Take 2,000 mcg by mouth daily.    [provider]    Allergies Alprazolam, Fentanyl, Meperidine, Meperidine hcl, and Ranitidine hcl  Family History  Problem Relation Age of Onset  . Breast cancer Maternal Aunt 40  . Cancer Mother         colon  . Aneurysm Father     Social History Social History   Tobacco Use  . Smoking status: Current Every Day Smoker    Packs/day: 1.00    Years: 50.00    Pack years: 50.00    Types: Cigarettes  . Smokeless tobacco: Never Used  Vaping Use  . Vaping Use: Every day  Substance Use Topics  . Alcohol use: No  . Drug use: No    Review of Systems  Constitutional: No fever/chills Eyes: No visual changes. ENT: No sore throat. Cardiovascular: Denies chest pain.  Positive for dizziness and lightheadedness. Respiratory: Denies shortness of breath. Gastrointestinal: No abdominal pain.  No nausea, no vomiting.  No diarrhea.  No constipation. Genitourinary: Negative for dysuria. Musculoskeletal: Positive for neck and back pain. Skin: Negative for rash. Neurological: Negative for headaches, focal weakness or numbness.  ____________________________________________   PHYSICAL EXAM:  VITAL SIGNS: ED Triage Vitals  Enc Vitals Group     BP      Pulse      Resp      Temp      Temp src      SpO2      Weight      Height      Head Circumference      Peak Flow      Pain Score      Pain Loc      Pain Edu?      Excl. in GC?     Constitutional: Alert and oriented. Eyes: Conjunctivae are normal. Head: Atraumatic, no scalp hematomas or step-offs. Nose: No congestion/rhinnorhea. Mouth/Throat: Mucous membranes are moist. Neck: Normal ROM, midline cervical spine tenderness to palpation noted. Cardiovascular: Normal rate, regular rhythm. Grossly normal heart sounds. Respiratory: Normal respiratory effort.  No retractions. Lungs CTAB. Gastrointestinal: Soft and nontender. No distention. Genitourinary: deferred Musculoskeletal: No lower extremity tenderness nor edema.  No upper extremity bony tenderness to palpation.  Midline thoracic and lumbar spinal tenderness to palpation noted. Neurologic:  Normal speech and language. No gross focal neurologic deficits are appreciated. Skin:   Skin is warm, dry and intact. No rash noted. Psychiatric: Mood and affect are normal. Speech and behavior are normal.  ____________________________________________   LABS (all labs ordered are listed, but only abnormal results are displayed)  Labs Reviewed  CBC WITH DIFFERENTIAL/PLATELET - Abnormal; Notable for the following components:      Result Value   RBC 3.81 (*)    Hemoglobin 11.5 (*)    HCT 34.9 (*)    RDW 17.0 (*)    All other components within normal limits  BASIC METABOLIC PANEL - Abnormal; Notable for the following components:   Sodium 133 (*)    Glucose, Bld 156 (*)    BUN 28 (*)    Creatinine, Ser 1.47 (*)    GFR, Estimated 37 (*)    All other components within normal limits  TROPONIN I (HIGH SENSITIVITY) - Abnormal; Notable for the following components:   Troponin I (High Sensitivity) 24 (*)    All other components within normal limits  TROPONIN I (HIGH SENSITIVITY) - Abnormal; Notable for the following components:   Troponin I (High Sensitivity) 22 (*)    All other components within normal limits   ____________________________________________  EKG  ED ECG REPORT I, Chesley Noon, the attending physician, personally viewed and interpreted this ECG.   Date: 08/05/2020  EKG Time: 8:07  Rate: 70  Rhythm: Atrial paced rhythm  Axis: LAD  Intervals:right bundle branch block and left anterior fascicular block  ST&T Change: Nonspecific T wave changes   PROCEDURES  Procedure(s) performed (including Critical Care):  Procedures   ____________________________________________   INITIAL IMPRESSION / ASSESSMENT AND PLAN / ED COURSE       75 year old female with past medical history of hypertension, hyperlipidemia, CAD, CHF, sick sinus syndrome status post pacemaker, COPD, and chronic pain syndrome who presents to the ED for lightheadedness and dizziness, causing her to fall backwards and hit her head.  We will further assess with CT head and cervical  spine, patient also with tenderness along her back which we will further assess with x-ray.  No evidence of extremity bony injury at this time.  We will check EKG and labs given her lightheadedness, no focal deficits to suggest stroke.  EKG shows no evidence of arrhythmia or ischemia, labs remarkable for very mild AKI and slightly elevated troponin.  Troponin is stable on recheck and I doubt cardiac etiology for her lightheadedness.  Chest x-ray reviewed by me and does show some pulmonary edema, although patient denies any shortness of breath and is breathing comfortably on room air.  CT head and cervical spine are negative for acute process, x-rays of chest and lumbar spine show no evidence of traumatic injury.  Patient is appropriate for discharge home with PCP follow-up, currently takes Lasix regularly.  She was counseled to return to the ED for any new or worsening symptoms, patient agrees with plan.      ____________________________________________   FINAL CLINICAL IMPRESSION(S) / ED DIAGNOSES  Final diagnoses:  Fall, initial encounter  Dizziness  Atherosclerosis of both carotid arteries     ED Discharge Orders    None       Note:  This document was prepared using Dragon voice recognition software and may include unintentional dictation errors.   Chesley Noon, MD 08/05/20 1041

## 2020-08-05 NOTE — ED Notes (Signed)
Provided coffee per pt request. Ok'd by Dr Larinda Buttery.

## 2020-08-05 NOTE — ED Triage Notes (Signed)
Pt in xray right now. Dr Larinda Buttery has evaluated pt.  Pt brought to ED via [facility] from [facility]. Was standing and felt dizzy and fell backwards. Hit back of head. Denies LOC. Pt has area of superficial swelling on back of head. Pt c/o "aches and pains" in lower bacl, L posterior head, L elbow, L knee. PERRLA per EMS and normal EMS vitals with CBG of 174. Pt is A&O, on RA. No slurred speech. Per EMS, pt felt nauseous en route to hospital.

## 2020-08-05 NOTE — ED Notes (Signed)
Attempted to call [facility] for care handoff. No answer times 2.

## 2020-08-06 ENCOUNTER — Encounter: Payer: Self-pay | Admitting: Ophthalmology

## 2020-08-06 ENCOUNTER — Other Ambulatory Visit: Payer: Self-pay

## 2020-08-06 IMAGING — CR DG SHOULDER 2+V*L*
3 series · 3 of 3 positions shown · non-contrast
Comparison: None.

CLINICAL DATA: Left shoulder pain after sliding out of bed this
morning

EXAM:
LEFT SHOULDER - 2+ VIEW

[shoulder grashey]
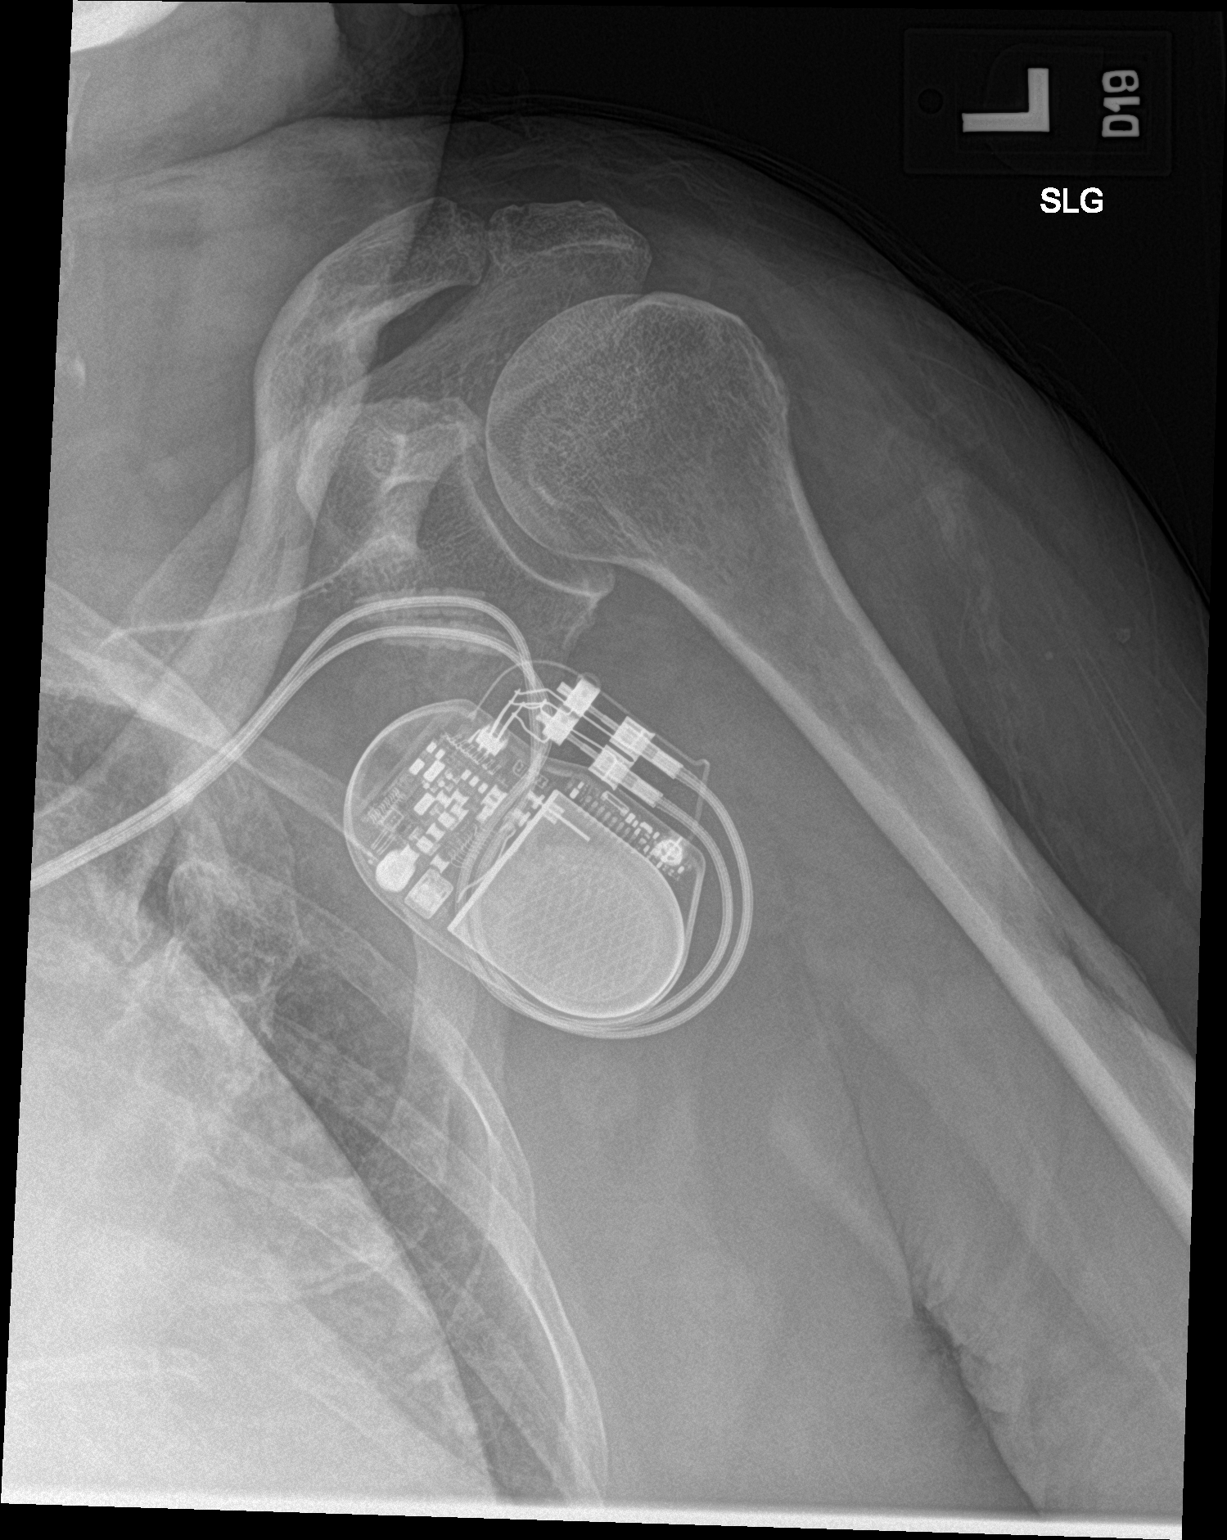

[shoulder y view]
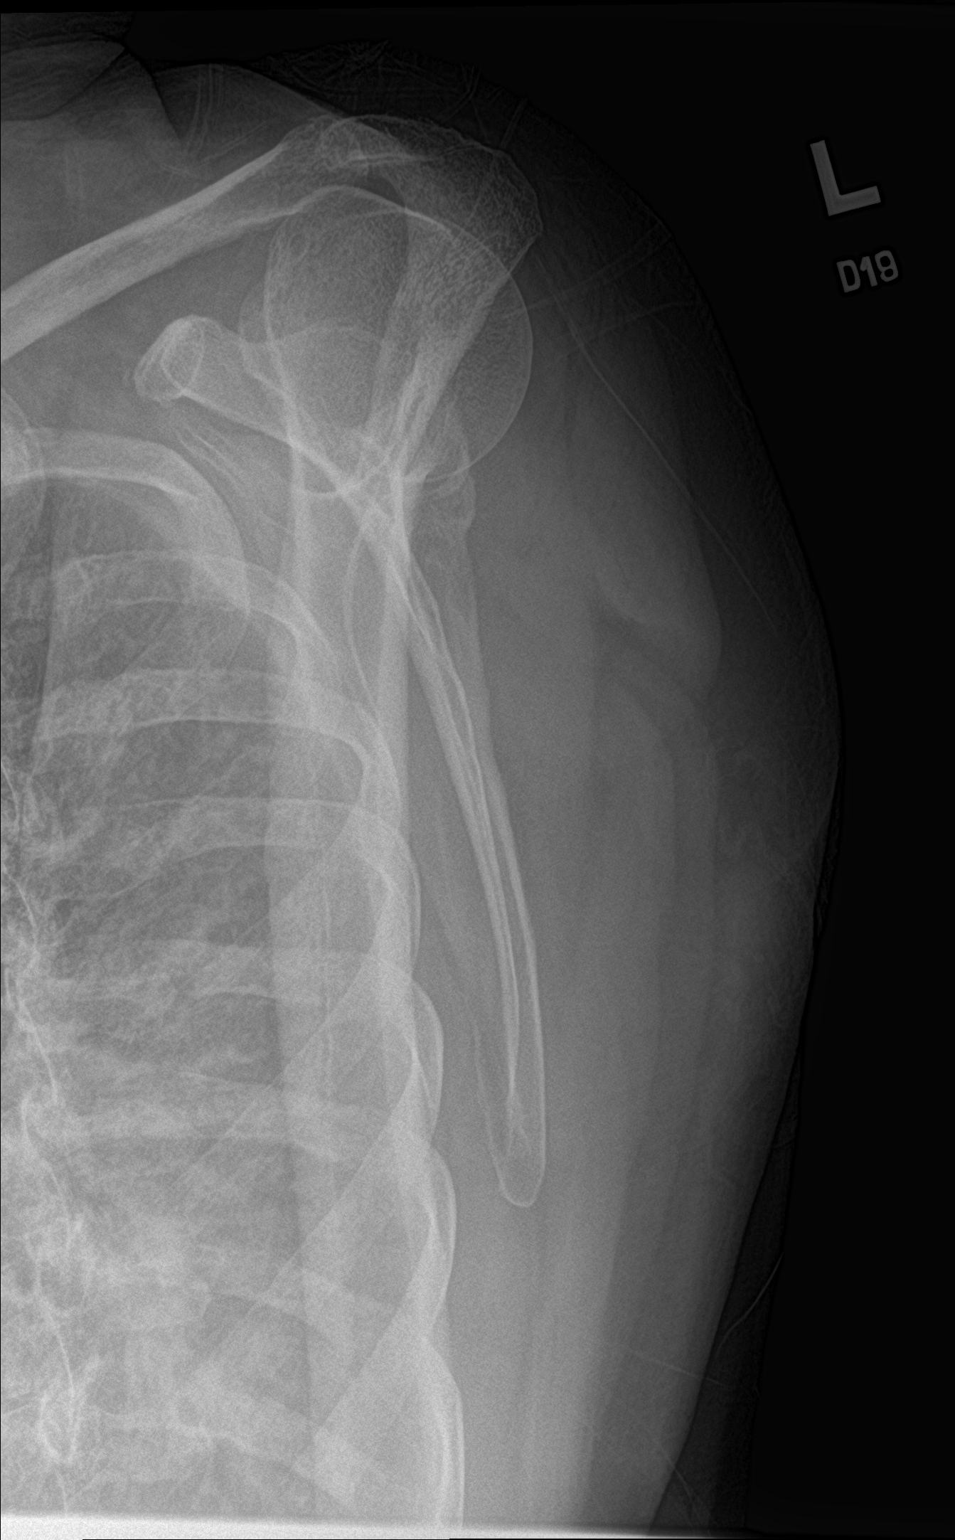

[shoulder ap neutral]
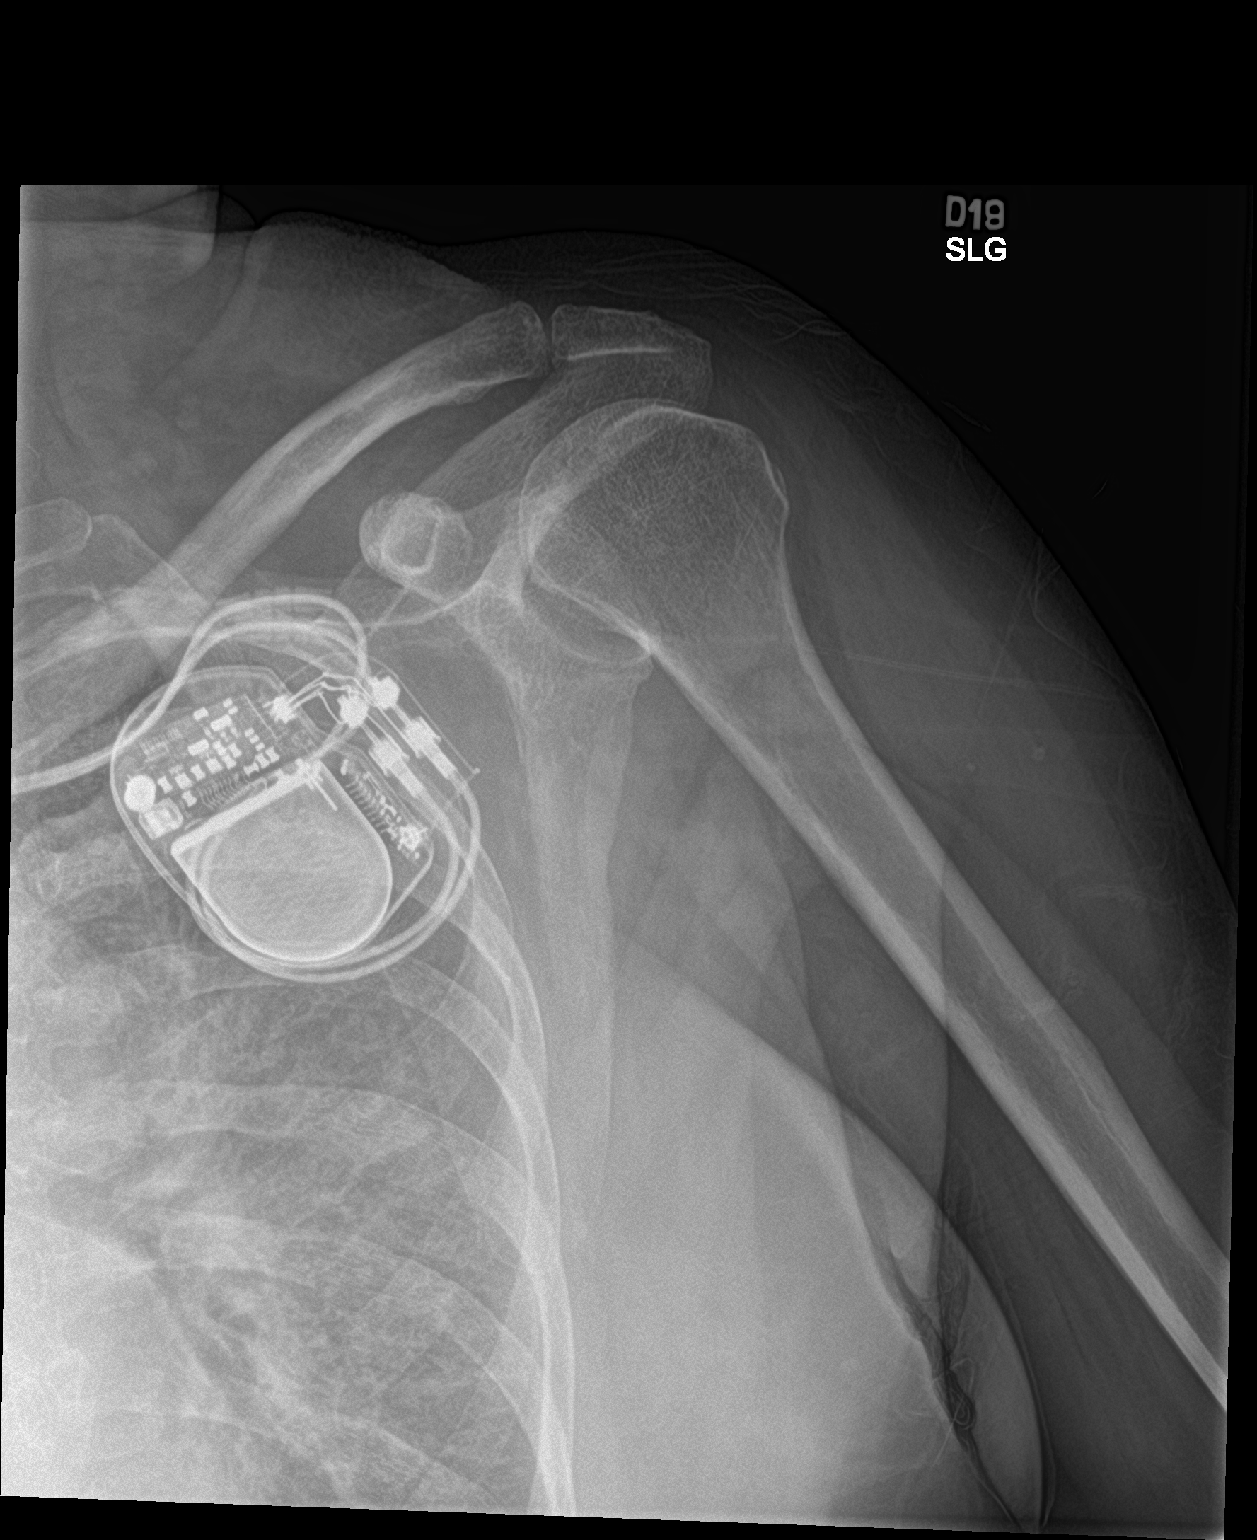

[3 of 3 positions shown; findings below may reference images not displayed]

FINDINGS: Partially visualized left subclavian 2 lead pacemaker. No fracture.
No glenohumeral dislocation. No evidence of acromioclavicular
separation. No suspicious focal osseous lesions. No significant
arthropathy. No pathologic soft tissue calcifications.
IMPRESSION: No left shoulder fracture or malalignment.

## 2020-08-06 IMAGING — CR DG LUMBAR SPINE 2-3V
3 series · 3 of 3 positions shown · non-contrast
Comparison: 04/02/2017 lumbar spine radiographs

CLINICAL DATA: Left low back pain after sliding out of bed this
morning

EXAM:
LUMBAR SPINE - 2-3 VIEW

[l-spine ap]
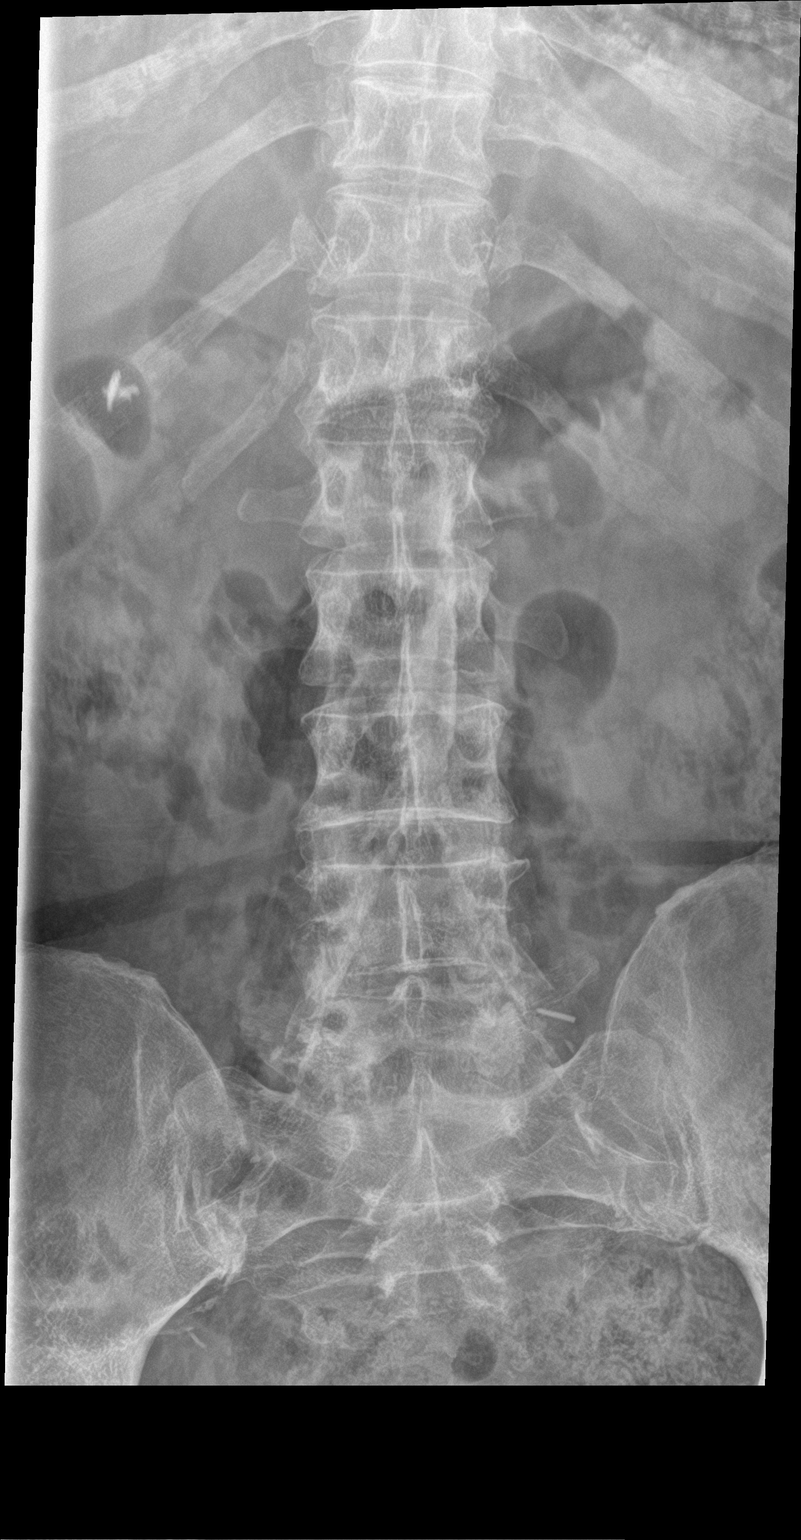

[l-spine lat]
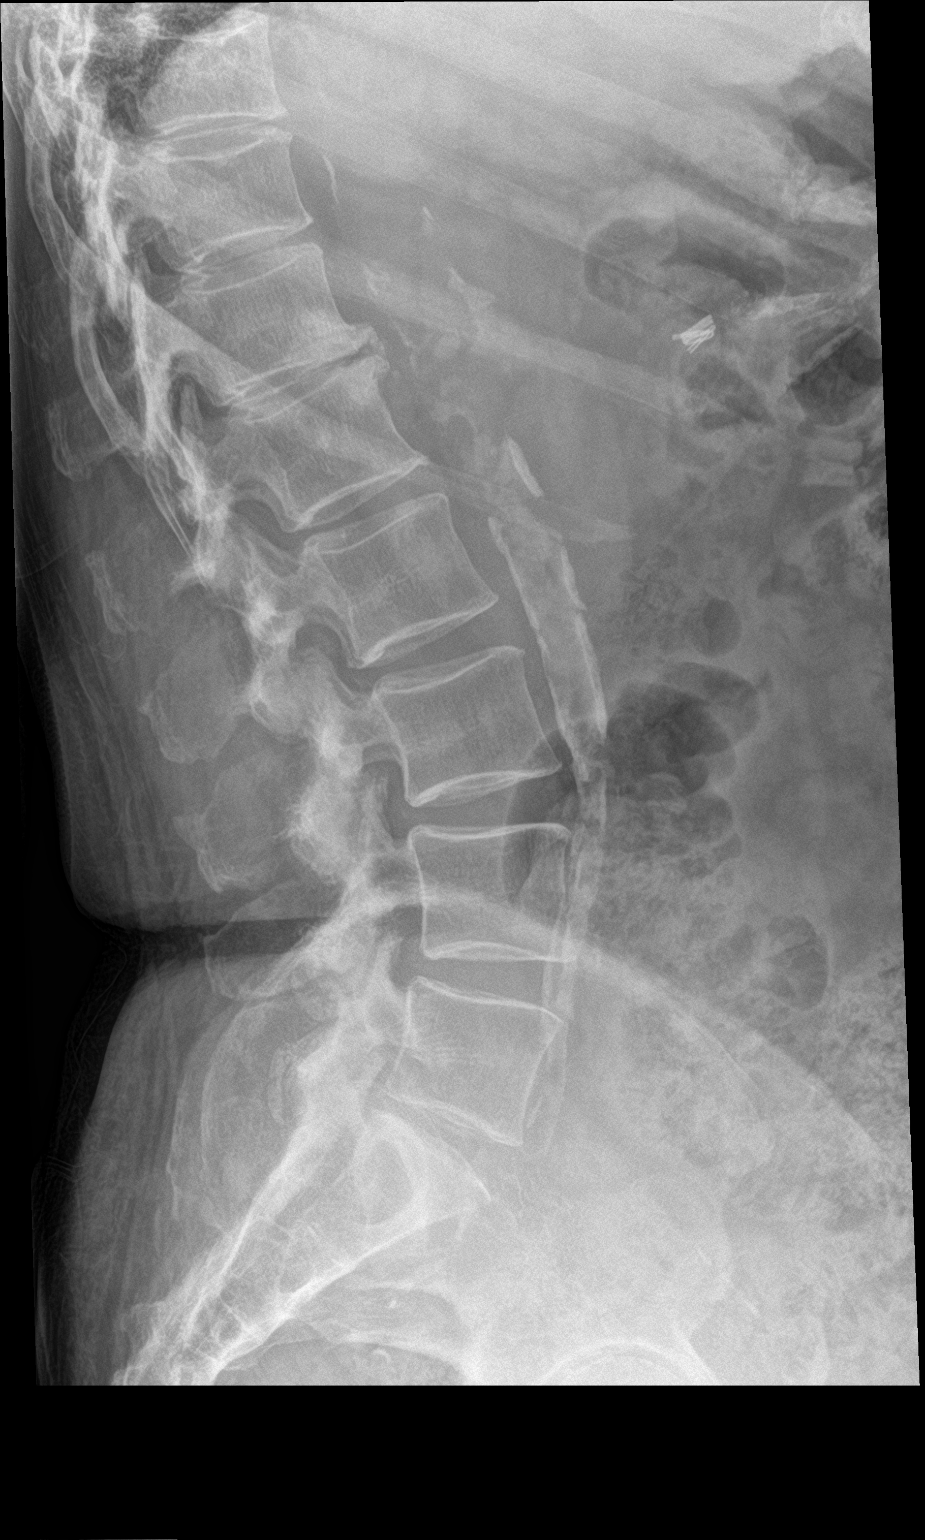

[l-spine spot]
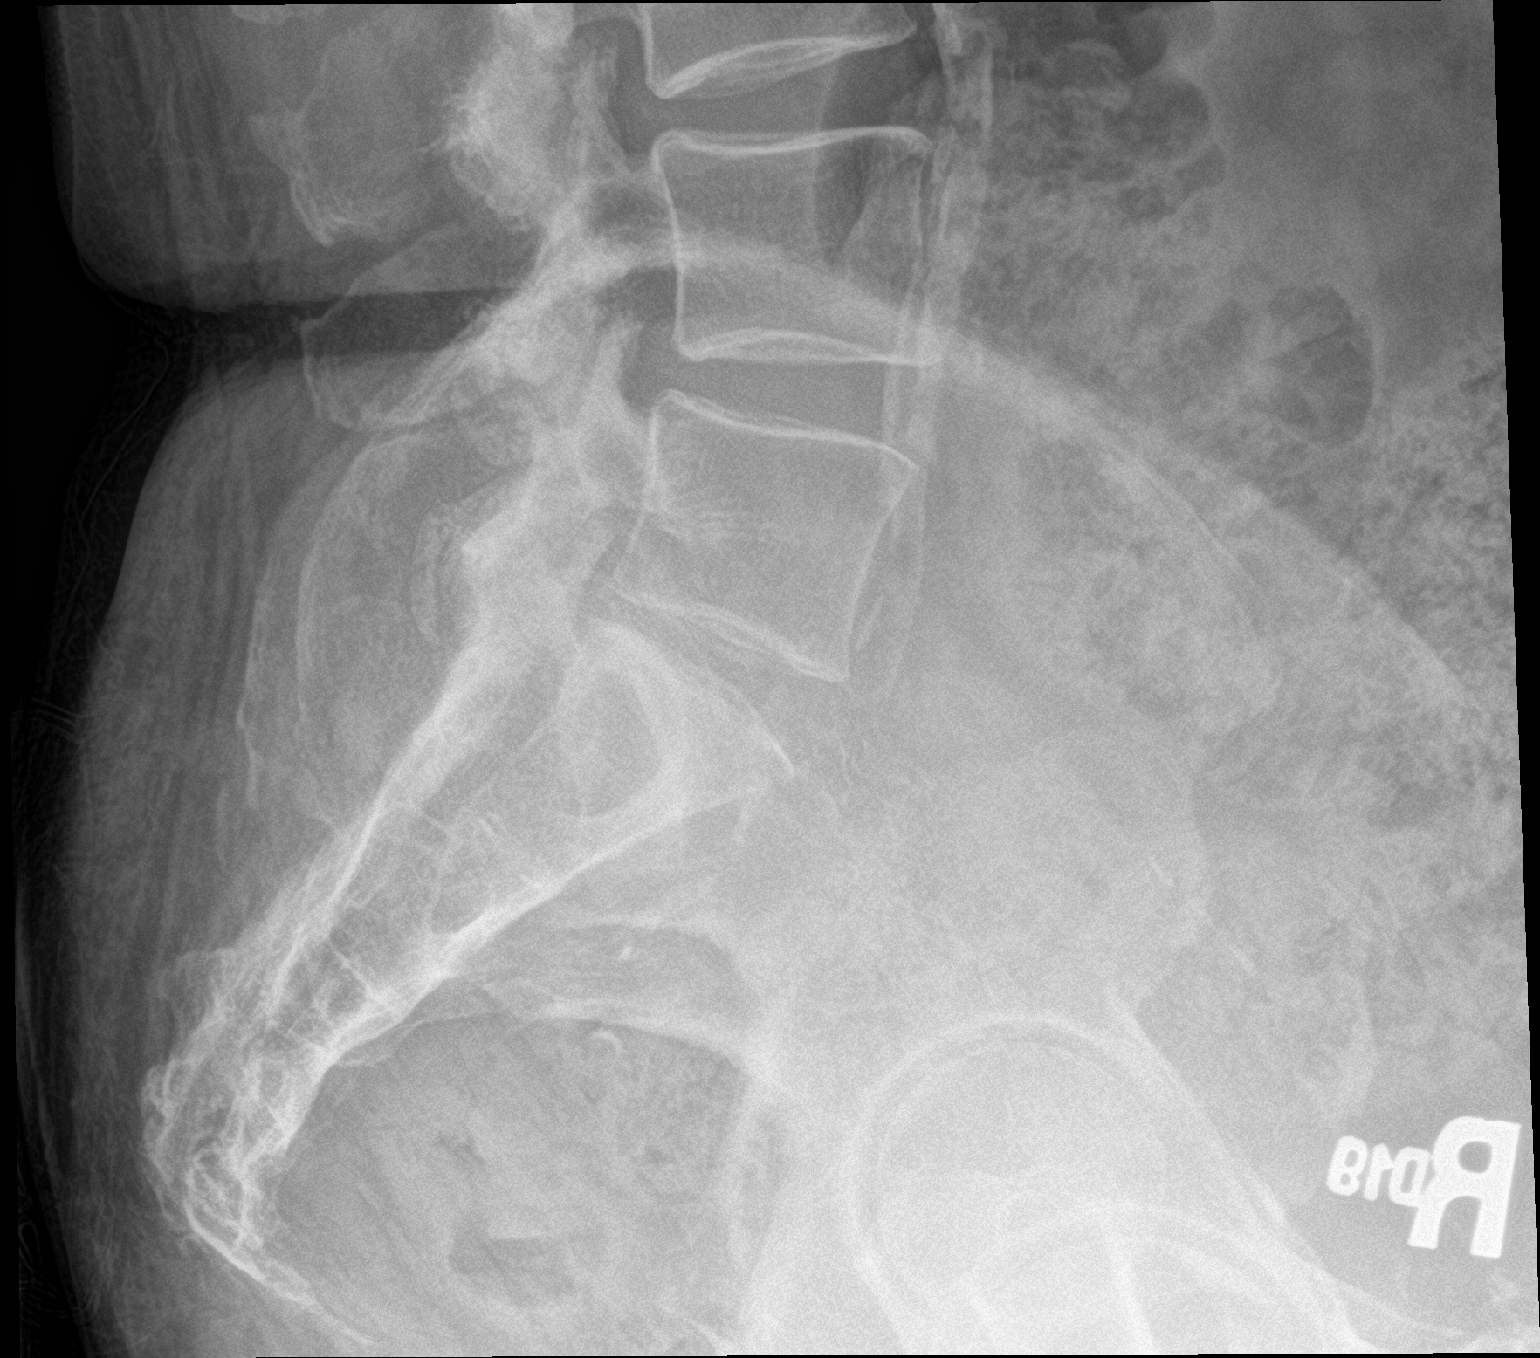

[3 of 3 positions shown; findings below may reference images not displayed]

FINDINGS: This report assumes 5 non rib-bearing lumbar vertebrae.

Lumbar vertebral body heights are preserved, with no fracture.

Mild multilevel lumbar degenerative disc disease, most prominent at
T12-L1. Minimal 2 mm anterolisthesis at L4-5, stable. No new
spondylolisthesis. Mild bilateral lower lumbar facet arthropathy. No
aggressive appearing focal osseous lesions. Abdominal aortic
atherosclerosis. Cholecystectomy clips are seen in the right upper
quadrant of the abdomen.
IMPRESSION: 1. No lumbar spine fracture.
2. Mild multilevel lumbar degenerative disc disease, most prominent
at T12-L1.
3. Stable minimal 2 mm anterolisthesis at L4-5.

## 2020-08-17 ENCOUNTER — Ambulatory Visit: Payer: Medicare Other | Admitting: Anesthesiology

## 2020-08-17 ENCOUNTER — Encounter
Admission: RE | Disposition: A | Discharge status: Home / Self Care | Payer: Self-pay | Source: Home / Self Care | Attending: Ophthalmology

## 2020-08-17 ENCOUNTER — Other Ambulatory Visit: Payer: Self-pay

## 2020-08-17 ENCOUNTER — Encounter: Payer: Self-pay | Admitting: Ophthalmology

## 2020-08-17 ENCOUNTER — Ambulatory Visit
Admission: RE | Admit: 2020-08-17 | Discharge: 2020-08-17 | Disposition: A | Discharge status: Home / Self Care | Payer: Medicare Other | Attending: Ophthalmology | Admitting: Ophthalmology

## 2020-08-17 DIAGNOSIS — Z800 Family history of malignant neoplasm of digestive organs: Secondary | ICD-10-CM | POA: Insufficient documentation

## 2020-08-17 DIAGNOSIS — H2511 Age-related nuclear cataract, right eye: Secondary | ICD-10-CM | POA: Diagnosis present

## 2020-08-17 DIAGNOSIS — F17210 Nicotine dependence, cigarettes, uncomplicated: Secondary | ICD-10-CM | POA: Insufficient documentation

## 2020-08-17 DIAGNOSIS — Z79890 Hormone replacement therapy (postmenopausal): Secondary | ICD-10-CM | POA: Diagnosis not present

## 2020-08-17 DIAGNOSIS — E1136 Type 2 diabetes mellitus with diabetic cataract: Secondary | ICD-10-CM | POA: Diagnosis not present

## 2020-08-17 DIAGNOSIS — Z8249 Family history of ischemic heart disease and other diseases of the circulatory system: Secondary | ICD-10-CM | POA: Diagnosis not present

## 2020-08-17 DIAGNOSIS — Z7984 Long term (current) use of oral hypoglycemic drugs: Secondary | ICD-10-CM | POA: Diagnosis not present

## 2020-08-17 DIAGNOSIS — Z803 Family history of malignant neoplasm of breast: Secondary | ICD-10-CM | POA: Insufficient documentation

## 2020-08-17 DIAGNOSIS — Z888 Allergy status to other drugs, medicaments and biological substances status: Secondary | ICD-10-CM | POA: Diagnosis not present

## 2020-08-17 DIAGNOSIS — Z79899 Other long term (current) drug therapy: Secondary | ICD-10-CM | POA: Diagnosis not present

## 2020-08-17 DIAGNOSIS — Z7982 Long term (current) use of aspirin: Secondary | ICD-10-CM | POA: Insufficient documentation

## 2020-08-17 HISTORY — PX: CATARACT EXTRACTION W/PHACO: SHX586

## 2020-08-17 LAB — GLUCOSE, CAPILLARY
Glucose-Capillary: 132 mg/dL — ABNORMAL HIGH (ref 70–99)
Glucose-Capillary: 127 mg/dL — ABNORMAL HIGH (ref 70–99)

## 2020-08-17 SURGERY — PHACOEMULSIFICATION, CATARACT, WITH IOL INSERTION
Anesthesia: Monitor Anesthesia Care | Site: Eye | Laterality: Right

## 2020-08-17 MED ORDER — TETRACAINE HCL 0.5 % OP SOLN
1.0000 [drp] | OPHTHALMIC | Status: DC | PRN
  Administered 2020-08-17 (×3): 1 [drp] via OPHTHALMIC

## 2020-08-17 MED ORDER — LIDOCAINE HCL (PF) 2 % IJ SOLN
INTRAOCULAR | Status: DC | PRN
  Administered 2020-08-17: 2 mL

## 2020-08-17 MED ORDER — MIDAZOLAM HCL 2 MG/2ML IJ SOLN
INTRAMUSCULAR | Status: DC | PRN
  Administered 2020-08-17: 1 mg via INTRAVENOUS

## 2020-08-17 MED ORDER — MOXIFLOXACIN HCL 0.5 % OP SOLN
OPHTHALMIC | Status: DC | PRN
  Administered 2020-08-17: 0.2 mL via OPHTHALMIC

## 2020-08-17 MED ORDER — EPINEPHRINE PF 1 MG/ML IJ SOLN
INTRAOCULAR | Status: DC | PRN
  Administered 2020-08-17: 58 mL via OPHTHALMIC

## 2020-08-17 MED ORDER — LACTATED RINGERS IV SOLN: INTRAVENOUS | Status: DC

## 2020-08-17 MED ORDER — FENTANYL CITRATE (PF) 100 MCG/2ML IJ SOLN
INTRAMUSCULAR | Status: DC | PRN
  Administered 2020-08-17: 50 ug via INTRAVENOUS

## 2020-08-17 MED ORDER — BRIMONIDINE TARTRATE-TIMOLOL 0.2-0.5 % OP SOLN
OPHTHALMIC | Status: DC | PRN
  Administered 2020-08-17: 1 [drp] via OPHTHALMIC

## 2020-08-17 MED ORDER — ARMC OPHTHALMIC DILATING DROPS
1.0000 "application " | OPHTHALMIC | Status: DC | PRN
  Administered 2020-08-17 (×3): 1 via OPHTHALMIC

## 2020-08-17 MED ORDER — NA CHONDROIT SULF-NA HYALURON 40-17 MG/ML IO SOLN
INTRAOCULAR | Status: DC | PRN
  Administered 2020-08-17: 1 mL via INTRAOCULAR

## 2020-08-17 SURGICAL SUPPLY — 18 items
CANNULA ANT/CHMB 27GA (MISCELLANEOUS) ×4 IMPLANT
GLOVE SURG TRIUMPH 8.0 PF LTX (GLOVE) ×4 IMPLANT
GOWN STRL REUS W/ TWL LRG LVL3 (GOWN DISPOSABLE) ×2 IMPLANT
GOWN STRL REUS W/TWL LRG LVL3 (GOWN DISPOSABLE) ×4
LENS IOL ACRSF IQ ULTRA 17.5 (Intraocular Lens) ×1 IMPLANT
LENS IOL ACRYSOF IQ 17.5 (Intraocular Lens) ×2 IMPLANT
MARKER SKIN DUAL TIP RULER LAB (MISCELLANEOUS) ×2 IMPLANT
NEEDLE FILTER BLUNT 18X 1/2SAF (NEEDLE) ×1
NEEDLE FILTER BLUNT 18X1 1/2 (NEEDLE) ×1 IMPLANT
PACK EYE AFTER SURG (MISCELLANEOUS) ×2 IMPLANT
PACK OPTHALMIC (MISCELLANEOUS) ×2 IMPLANT
PACK PORFILIO (MISCELLANEOUS) ×2 IMPLANT
SUT ETHILON 10-0 CS-B-6CS-B-6 (SUTURE)
SUTURE EHLN 10-0 CS-B-6CS-B-6 (SUTURE) IMPLANT
SYR 3ML LL SCALE MARK (SYRINGE) ×2 IMPLANT
SYR TB 1ML LUER SLIP (SYRINGE) ×2 IMPLANT
WATER STERILE IRR 250ML POUR (IV SOLUTION) ×2 IMPLANT
WIPE NON LINTING 3.25X3.25 (MISCELLANEOUS) ×2 IMPLANT

## 2020-08-17 NOTE — Anesthesia Procedure Notes (Signed)
Procedure Name: MAC Date/Time: 08/17/2020 7:43 AM Performed by: Cameron Ali, CRNA Pre-anesthesia Checklist: Patient identified, Emergency Drugs available, Suction available, Timeout performed and Patient being monitored Patient Re-evaluated:Patient Re-evaluated prior to induction Oxygen Delivery Method: Nasal cannula Placement Confirmation: positive ETCO2

## 2020-08-17 NOTE — Transfer of Care (Signed)
Immediate Anesthesia Transfer of Care Note  Patient: Melanie King  Procedure(s) Performed: CATARACT EXTRACTION PHACO AND INTRAOCULAR LENS PLACEMENT (IOC) RIGHT DIABETIC 8.90 00:52.9 (Right: Eye)  Patient Location: PACU  Anesthesia Type: MAC  Level of Consciousness: awake, alert  and patient cooperative  Airway and Oxygen Therapy: Patient Spontanous Breathing and Patient connected to supplemental oxygen  Post-op Assessment: Post-op Vital signs reviewed, Patient's Cardiovascular Status Stable, Respiratory Function Stable, Patent Airway and No signs of Nausea or vomiting  Post-op Vital Signs: Reviewed and stable  Complications: No notable events documented.

## 2020-08-17 NOTE — H&P (Signed)
[facility]   Primary Care Physician:  Marguarite Arbour, MD Ophthalmologist: Dr. Druscilla Brownie  Pre-Procedure History & Physical: HPI:  Melanie King is a 75 y.o. female here for cataract surgery.   Past Medical History:  Diagnosis Date   Arthritis    CHF (congestive heart failure) (HCC)    COPD (chronic obstructive pulmonary disease) (HCC)    Coronary arteriosclerosis    DDD (degenerative disc disease), cervical    DDD (degenerative disc disease), lumbar    Depression    Diabetes mellitus without complication (HCC)    GERD (gastroesophageal reflux disease)    Hyperlipidemia    Hypertension    Osteoarthritis    Pacemaker    Tremor     Past Surgical History:  Procedure Laterality Date   ABDOMINAL HYSTERECTOMY     BREAST BIOPSY Right 1994   neg cyst removed   CHOLECYSTECTOMY     EYE SURGERY  1964, 1966, and 1967   bilateral   FOOT SURGERY Right    cellulitis   PACEMAKER INSERTION     PPM GENERATOR CHANGEOUT N/A 02/24/2020   Procedure: PPM GENERATOR CHANGEOUT;  Surgeon: Marcina Millard, MD;  Location: [facility] INVASIVE CV LAB;  Service: Cardiovascular;  Laterality: N/A;   SPINE SURGERY     cyst removed; [facility]    Prior to Admission medications   Medication Sig Start Date End Date Taking? Authorizing Provider  acetaminophen (TYLENOL) 500 MG tablet Take 1,000 mg by mouth every 6 (six) hours as needed for mild pain or moderate pain.   Yes [provider]  amLODipine (NORVASC) 10 MG tablet TAKE 1 TABLET BY MOUTH EVERY DAY Patient taking differently: Take 10 mg by mouth daily. 08/28/16  Yes Gabriel Cirri, NP  aspirin EC 81 MG tablet Take 81 mg by mouth daily.   Yes [provider]  benztropine (COGENTIN) 0.5 MG tablet Take 0.5 mg by mouth 2 (two) times daily.   Yes [provider]  brexpiprazole (REXULTI) 2 MG TABS tablet Take 1 mg by mouth at bedtime.   Yes [provider]  calcitRIOL (ROCALTROL) 0.25 MCG capsule Take 0.25  mcg by mouth daily.   Yes [provider]  cholecalciferol (VITAMIN D3) 25 MCG (1000 UNIT) tablet Take 400 Units by mouth daily.    Yes [provider]  citalopram (CELEXA) 20 MG tablet Take 20 mg by mouth daily.   Yes [provider]  fenofibrate (TRICOR) 48 MG tablet Take 48 mg by mouth daily.   Yes [provider]  fluticasone (FLONASE) 50 MCG/ACT nasal spray Place 2 sprays into both nostrils daily.   Yes [provider]  furosemide (LASIX) 20 MG tablet Take 20 mg by mouth daily.   Yes [provider]  gabapentin (NEURONTIN) 300 MG capsule Take 300 mg by mouth daily.   Yes [provider]  levothyroxine (SYNTHROID) 125 MCG tablet Take 1 tablet (125 mcg total) by mouth daily before breakfast. Patient taking differently: Take 150 mcg by mouth daily before breakfast. 05/28/19  Yes Dhungel, Nishant, MD  losartan (COZAAR) 50 MG tablet Take 50 mg by mouth daily.   Yes [provider]  magnesium oxide (MAG-OX) 400 MG tablet Take 400 mg by mouth daily.   Yes [provider]  metFORMIN (GLUCOPHAGE) 1000 MG tablet Take 0.5 tablets (500 mg total) by mouth 2 (two) times daily with a meal. 05/28/19  Yes Dhungel, Nishant, MD  metoprolol tartrate (LOPRESSOR) 25 MG tablet Take 25 mg  by mouth 2 (two) times daily.   Yes [provider]  montelukast (SINGULAIR) 10 MG tablet Take 10 mg by mouth daily.   Yes [provider]  pantoprazole (PROTONIX) 40 MG tablet Take 40 mg by mouth daily.   Yes [provider]  primidone (MYSOLINE) 50 MG tablet Take 50 mg by mouth 2 (two) times daily.   Yes [provider]  senna-docusate (SENOKOT-S) 8.6-50 MG tablet Take 2 tablets by mouth daily.   Yes [provider]  sitaGLIPtin (JANUVIA) 50 MG tablet Take 50 mg by mouth daily.   Yes [provider]  traZODone (DESYREL) 50 MG tablet Take by mouth at bedtime as needed for sleep.   Yes [provider]  vitamin B-12 (CYANOCOBALAMIN) 1000 MCG tablet Take 2,000 mcg by mouth daily.   Yes [provider]  cephALEXin (KEFLEX) 250 MG capsule Take 2 capsules (500 mg total) by mouth 2 (two) times daily. Patient not taking: Reported on 04/12/2020 02/24/20   Marcina Millard, MD  hydrocortisone cream 1 % 1 application 2 (two) times daily. Rectally Patient not taking: No sig reported    [provider]  nitroGLYCERIN (NITRODUR - DOSED IN MG/24 HR) 0.2 mg/hr patch Place 0.2 mg onto the skin daily. leave patch on 12-14 hours, then remove for 10-12 hours prior to applying the next patch  Patient not taking: Reported on 08/06/2020    [provider]    Allergies as of 07/14/2020 - Review Complete 05/02/2020  Allergen Reaction Noted   Alprazolam Swelling 10/27/2014   Fentanyl Other (See Comments) 04/02/2015   Meperidine Itching 04/02/2015   Meperidine hcl  04/12/2020   Ranitidine hcl  04/02/2015    Family History  Problem Relation Age of Onset   Breast cancer Maternal Aunt 41   Cancer Mother        colon   Aneurysm Father     Social History   Socioeconomic History   Marital status: Single    Spouse name: Not on file   Number of children: Not on file   Years of education: Not on file   Highest education level: Not on file  Occupational History   Not on file  Tobacco Use   Smoking status: Every Day    Packs/day: 1.00    Years: 50.00    Pack years: 50.00    Types: Cigarettes   Smokeless tobacco: Never  Vaping Use   Vaping Use: Every day  Substance and Sexual Activity   Alcohol use: No   Drug use: No   Sexual activity: Never  Other Topics Concern   Not on file  Social History Narrative   Not on file   Social Determinants of Health   Financial Resource Strain: Not on file  Food Insecurity: Not on file  Transportation Needs: Not on file  Physical Activity: Not on file  Stress: Not on file  Social Connections: Not on file  Intimate  Partner Violence: Not on file    Review of Systems: See HPI, otherwise negative ROS  Physical Exam: BP (!) 192/73   Pulse 82   Temp 98.7 F (37.1 C) (Temporal)   Wt 67.1 kg   LMP  (LMP Unknown)   SpO2 93%   BMI 27.96 kg/m  General:   Alert,  pleasant and cooperative in NAD Head:  Normocephalic and atraumatic. Respiratory:  Normal work of breathing. Cardiovascular:  RRR  Impression/Plan: Melanie King is here for cataract surgery.  Risks, benefits,  limitations, and alternatives regarding cataract surgery have been reviewed with the patient.  Questions have been answered.  All parties agreeable.   Galen Manila, MD  08/17/2020, 7:18 AM

## 2020-08-17 NOTE — Anesthesia Preprocedure Evaluation (Signed)
Anesthesia Evaluation  Patient identified by MRN, date of birth, ID band Patient awake    Reviewed: Allergy & Precautions, H&P , NPO status , Patient's Chart, lab work & pertinent test results, reviewed documented beta blocker date and time   Airway Mallampati: II  TM Distance: >3 FB Neck ROM: full    Dental no notable dental hx.    Pulmonary COPD, Current Smoker,    Pulmonary exam normal breath sounds clear to auscultation       Cardiovascular Exercise Tolerance: Good hypertension, + angina + CAD  + pacemaker (sick sinus syndrome)  Rhythm:regular Rate:Normal     Neuro/Psych Depression tremor    GI/Hepatic Neg liver ROS, GERD  ,  Endo/Other  diabetes, Type 2Hypothyroidism   Renal/GU negative Renal ROS  negative genitourinary   Musculoskeletal  (+) Arthritis ,   Abdominal   Peds  Hematology negative hematology ROS (+)   Anesthesia Other Findings   Reproductive/Obstetrics negative OB ROS                             Anesthesia Physical Anesthesia Plan  ASA: 3  Anesthesia Plan: MAC   Post-op Pain Management:    Induction:   PONV Risk Score and Plan: 1 and Treatment may vary due to age or medical condition  Airway Management Planned:   Additional Equipment:   Intra-op Plan:   Post-operative Plan:   Informed Consent: I have reviewed the patients History and Physical, chart, labs and discussed the procedure including the risks, benefits and alternatives for the proposed anesthesia with the patient or authorized representative who has indicated his/her understanding and acceptance.     Dental Advisory Given  Plan Discussed with: CRNA  Anesthesia Plan Comments:         Anesthesia Quick Evaluation

## 2020-08-17 NOTE — Anesthesia Postprocedure Evaluation (Signed)
Anesthesia Post Note  Patient: Melanie King  Procedure(s) Performed: CATARACT EXTRACTION PHACO AND INTRAOCULAR LENS PLACEMENT (IOC) RIGHT DIABETIC 8.90 00:52.9 (Right: Eye)     Patient location during evaluation: PACU Anesthesia Type: MAC Level of consciousness: awake and alert Pain management: pain level controlled Vital Signs Assessment: post-procedure vital signs reviewed and stable Respiratory status: spontaneous breathing, nonlabored ventilation, respiratory function stable and patient connected to nasal cannula oxygen Cardiovascular status: stable and blood pressure returned to baseline Postop Assessment: no apparent nausea or vomiting Anesthetic complications: no   No notable events documented.  Alisa Graff

## 2020-08-17 NOTE — Op Note (Signed)
PREOPERATIVE DIAGNOSIS:  Nuclear sclerotic cataract of the right eye.   POSTOPERATIVE DIAGNOSIS:  Cataract   OPERATIVE PROCEDURE:ORPROCALL@   SURGEON:  Melanie Manila, MD.   ANESTHESIA:  Anesthesiologist: Scarlette Slice, MD CRNA: Maree Krabbe, CRNA  1.      Managed anesthesia care. 2.      0.67ml of Shugarcaine was instilled in the eye following the paracentesis.   COMPLICATIONS:  None.   TECHNIQUE:   Stop and chop   DESCRIPTION OF PROCEDURE:  The patient was examined and consented in the preoperative holding area where the aforementioned topical anesthesia was applied to the right eye and then brought back to the Operating Room where the right eye was prepped and draped in the usual sterile ophthalmic fashion and a lid speculum was placed. A paracentesis was created with the side port blade and the anterior chamber was filled with viscoelastic. A near clear corneal incision was performed with the steel keratome. A continuous curvilinear capsulorrhexis was performed with a cystotome followed by the capsulorrhexis forceps. Hydrodissection and hydrodelineation were carried out with BSS on a blunt cannula. The lens was removed in a stop and chop  technique and the remaining cortical material was removed with the irrigation-aspiration handpiece. The capsular bag was inflated with viscoelastic and the Technis [id number]  lens was placed in the capsular bag without complication. The remaining viscoelastic was removed from the eye with the irrigation-aspiration handpiece. The wounds were hydrated. The anterior chamber was flushed with BSS and the eye was inflated to physiologic pressure. 0.77ml of Vigamox was placed in the anterior chamber. The wounds were found to be water tight. The eye was dressed with Combigan. The patient was given protective glasses to wear throughout the day and a shield with which to sleep tonight. The patient was also given drops with which to begin a drop regimen today and will  follow-up with me in one day. Implant Name Type Inv. Item Serial No. Manufacturer Lot No. LRB No. Used Action  LENS IOL ACRYSOF IQ 17.5 - [id number] Intraocular Lens LENS IOL ACRYSOF IQ 17.5 [id number] ALCON  Right 1 Implanted   Procedure(s) with comments: CATARACT EXTRACTION PHACO AND INTRAOCULAR LENS PLACEMENT (IOC) RIGHT DIABETIC 8.90 00:52.9 (Right) - Diabetic  Electronically signed: Galen King 08/17/2020 7:48 AM

## 2020-08-18 ENCOUNTER — Encounter: Payer: Self-pay | Admitting: Ophthalmology

## 2020-08-25 IMAGING — DX DG CHEST 1V PORT
1 series · 1 of 1 positions shown · non-contrast
Comparison: 07/15/2019.

CLINICAL DATA: Chest pain.

EXAM:
PORTABLE CHEST 1 VIEW

[chest ap]
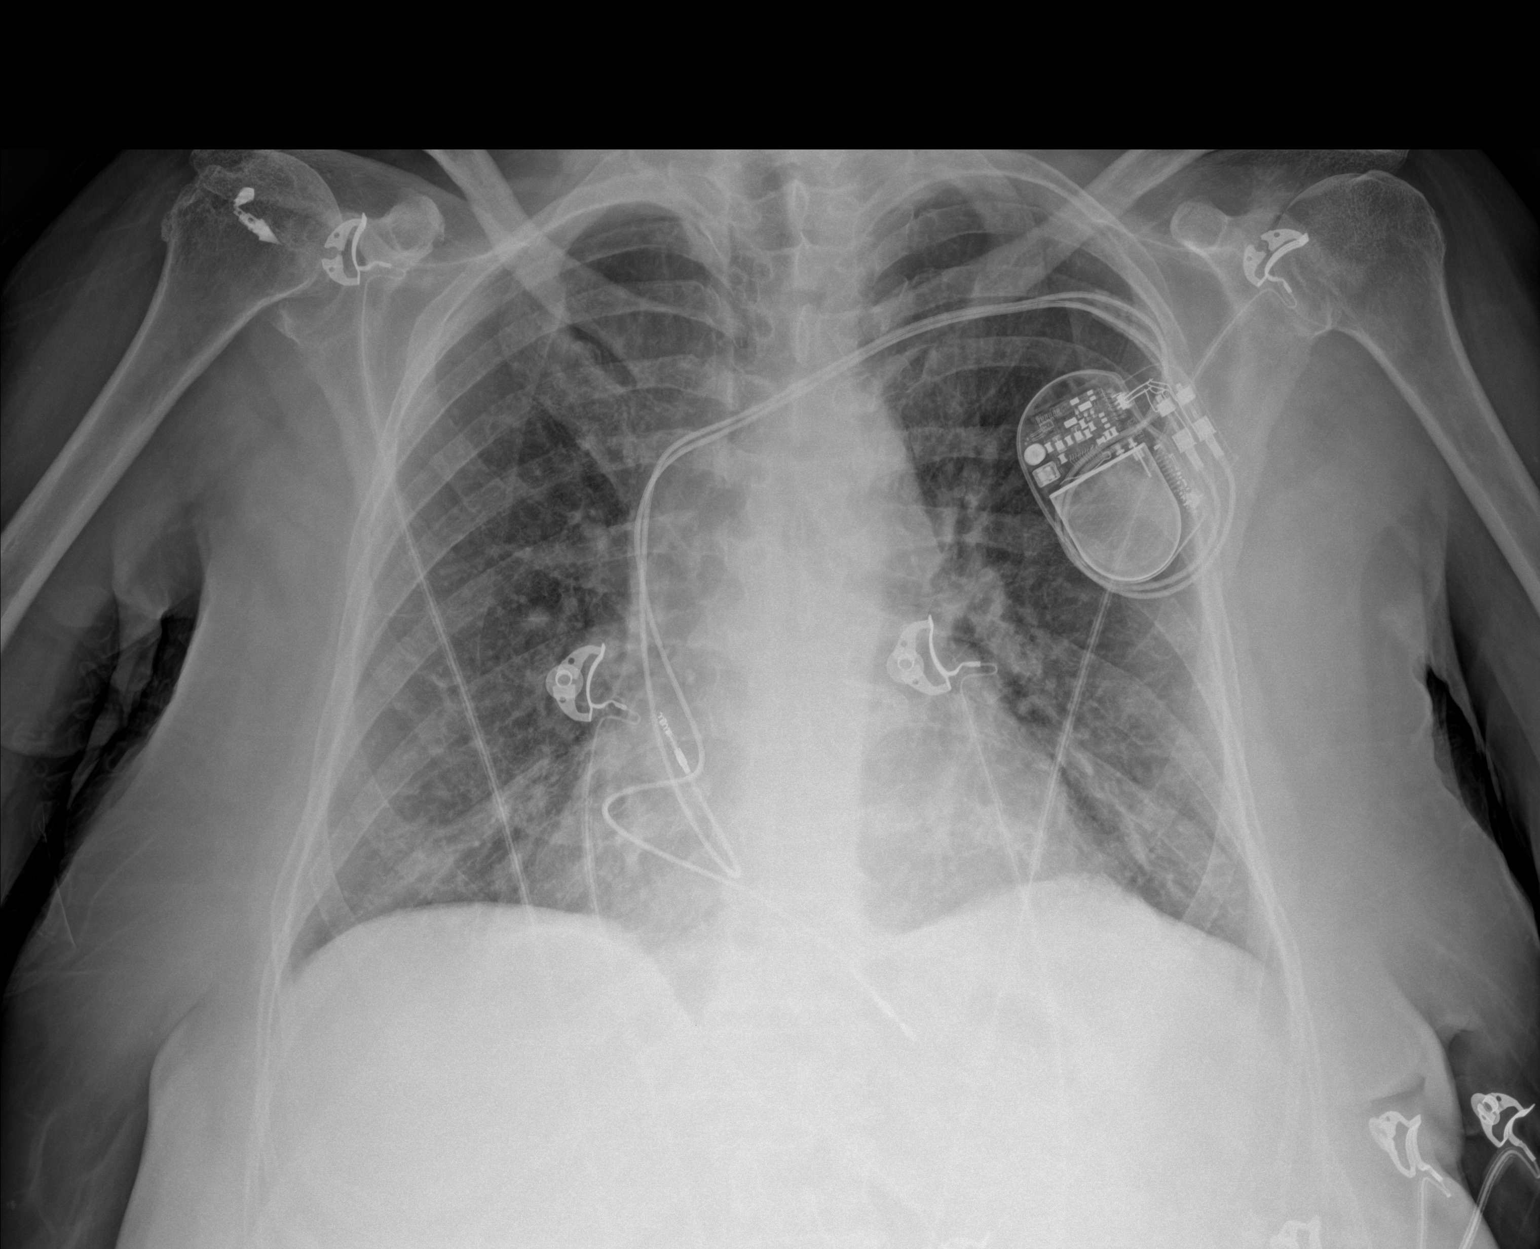

[1 of 1 positions shown; findings below may reference images not displayed]

FINDINGS: 8066 hours. Low lung volumes. The cardio pericardial silhouette is
enlarged. Pulmonary vascular congestion with probable interstitial
edema. No focal consolidation or substantial pleural effusion. The
visualized bony structures of the thorax show now acute abnormality.
Telemetry leads overlie the chest.
IMPRESSION: Pulmonary vascular congestion and probable interstitial edema.

## 2020-08-26 ENCOUNTER — Inpatient Hospital Stay
Admission: EM | Admit: 2020-08-26 | Discharge: 2020-08-30 | DRG: 291 | Disposition: A | Discharge status: Home / Self Care | Payer: Medicare Other | Source: Skilled Nursing Facility | Attending: Internal Medicine | Admitting: Internal Medicine

## 2020-08-26 ENCOUNTER — Emergency Department: Payer: Medicare Other

## 2020-08-26 DIAGNOSIS — Z803 Family history of malignant neoplasm of breast: Secondary | ICD-10-CM

## 2020-08-26 DIAGNOSIS — I2510 Atherosclerotic heart disease of native coronary artery without angina pectoris: Secondary | ICD-10-CM | POA: Diagnosis present

## 2020-08-26 DIAGNOSIS — I509 Heart failure, unspecified: Secondary | ICD-10-CM

## 2020-08-26 DIAGNOSIS — N1832 Chronic kidney disease, stage 3b: Secondary | ICD-10-CM | POA: Diagnosis present

## 2020-08-26 DIAGNOSIS — R778 Other specified abnormalities of plasma proteins: Secondary | ICD-10-CM

## 2020-08-26 DIAGNOSIS — Z79899 Other long term (current) drug therapy: Secondary | ICD-10-CM | POA: Diagnosis not present

## 2020-08-26 DIAGNOSIS — Z6827 Body mass index (BMI) 27.0-27.9, adult: Secondary | ICD-10-CM

## 2020-08-26 DIAGNOSIS — I495 Sick sinus syndrome: Secondary | ICD-10-CM | POA: Diagnosis present

## 2020-08-26 DIAGNOSIS — E559 Vitamin D deficiency, unspecified: Secondary | ICD-10-CM | POA: Diagnosis present

## 2020-08-26 DIAGNOSIS — R0602 Shortness of breath: Secondary | ICD-10-CM

## 2020-08-26 DIAGNOSIS — E1122 Type 2 diabetes mellitus with diabetic chronic kidney disease: Secondary | ICD-10-CM | POA: Diagnosis present

## 2020-08-26 DIAGNOSIS — Z950 Presence of cardiac pacemaker: Secondary | ICD-10-CM | POA: Diagnosis present

## 2020-08-26 DIAGNOSIS — I5033 Acute on chronic diastolic (congestive) heart failure: Secondary | ICD-10-CM

## 2020-08-26 DIAGNOSIS — E440 Moderate protein-calorie malnutrition: Secondary | ICD-10-CM | POA: Insufficient documentation

## 2020-08-26 DIAGNOSIS — E119 Type 2 diabetes mellitus without complications: Secondary | ICD-10-CM

## 2020-08-26 DIAGNOSIS — Z20822 Contact with and (suspected) exposure to covid-19: Secondary | ICD-10-CM | POA: Diagnosis present

## 2020-08-26 DIAGNOSIS — J449 Chronic obstructive pulmonary disease, unspecified: Secondary | ICD-10-CM

## 2020-08-26 DIAGNOSIS — E785 Hyperlipidemia, unspecified: Secondary | ICD-10-CM | POA: Diagnosis present

## 2020-08-26 DIAGNOSIS — I160 Hypertensive urgency: Secondary | ICD-10-CM | POA: Diagnosis present

## 2020-08-26 DIAGNOSIS — I130 Hypertensive heart and chronic kidney disease with heart failure and stage 1 through stage 4 chronic kidney disease, or unspecified chronic kidney disease: Principal | ICD-10-CM | POA: Diagnosis present

## 2020-08-26 DIAGNOSIS — I501 Left ventricular failure: Secondary | ICD-10-CM

## 2020-08-26 DIAGNOSIS — F17210 Nicotine dependence, cigarettes, uncomplicated: Secondary | ICD-10-CM | POA: Diagnosis present

## 2020-08-26 DIAGNOSIS — J441 Chronic obstructive pulmonary disease with (acute) exacerbation: Secondary | ICD-10-CM | POA: Diagnosis present

## 2020-08-26 DIAGNOSIS — E871 Hypo-osmolality and hyponatremia: Secondary | ICD-10-CM | POA: Diagnosis present

## 2020-08-26 DIAGNOSIS — J9601 Acute respiratory failure with hypoxia: Secondary | ICD-10-CM | POA: Diagnosis present

## 2020-08-26 DIAGNOSIS — Z794 Long term (current) use of insulin: Secondary | ICD-10-CM

## 2020-08-26 DIAGNOSIS — I10 Essential (primary) hypertension: Secondary | ICD-10-CM | POA: Diagnosis present

## 2020-08-26 DIAGNOSIS — T502X5A Adverse effect of carbonic-anhydrase inhibitors, benzothiadiazides and other diuretics, initial encounter: Secondary | ICD-10-CM | POA: Diagnosis present

## 2020-08-26 DIAGNOSIS — N179 Acute kidney failure, unspecified: Secondary | ICD-10-CM

## 2020-08-26 DIAGNOSIS — I248 Other forms of acute ischemic heart disease: Secondary | ICD-10-CM | POA: Diagnosis present

## 2020-08-26 DIAGNOSIS — R7989 Other specified abnormal findings of blood chemistry: Secondary | ICD-10-CM

## 2020-08-26 DIAGNOSIS — N189 Chronic kidney disease, unspecified: Secondary | ICD-10-CM

## 2020-08-26 LAB — COMPREHENSIVE METABOLIC PANEL
ALT: <5 U/L (ref 0–44)
AST: 14 U/L — ABNORMAL LOW (ref 15–41)
Albumin: 3.6 g/dL (ref 3.5–5.0)
Alkaline Phosphatase: 76 U/L (ref 38–126)
Anion gap: 8 (ref 5–15)
BUN: 36 mg/dL — ABNORMAL HIGH (ref 8–23)
CO2: 25 mmol/L (ref 22–32)
Calcium: 9 mg/dL (ref 8.9–10.3)
Chloride: 99 mmol/L (ref 98–111)
Creatinine, Ser: 1.79 mg/dL — ABNORMAL HIGH (ref 0.44–1.00)
GFR, Estimated: 29 mL/min — ABNORMAL LOW (ref >60)
Glucose, Bld: 225 mg/dL — ABNORMAL HIGH (ref 70–99)
Potassium: 4.5 mmol/L (ref 3.5–5.1)
Sodium: 132 mmol/L — ABNORMAL LOW (ref 135–145)
Total Bilirubin: 0.7 mg/dL (ref 0.3–1.2)
Total Protein: 7.6 g/dL (ref 6.5–8.1)

## 2020-08-26 LAB — CBC WITH DIFFERENTIAL/PLATELET
Abs Immature Granulocytes: 0.09 10*3/uL — ABNORMAL HIGH (ref 0.00–0.07)
Basophils Absolute: 0.1 10*3/uL (ref 0.0–0.1)
Basophils Relative: 1 %
Eosinophils Absolute: 0 10*3/uL (ref 0.0–0.5)
Eosinophils Relative: 0 %
HCT: 27.9 % — ABNORMAL LOW (ref 36.0–46.0)
Hemoglobin: 9.2 g/dL — ABNORMAL LOW (ref 12.0–15.0)
Immature Granulocytes: 1 %
Lymphocytes Relative: 13 %
Lymphs Abs: 1.7 10*3/uL (ref 0.7–4.0)
MCH: 31.2 pg (ref 26.0–34.0)
MCHC: 33 g/dL (ref 30.0–36.0)
MCV: 94.6 fL (ref 80.0–100.0)
Monocytes Absolute: 0.8 10*3/uL (ref 0.1–1.0)
Monocytes Relative: 6 %
Neutro Abs: 10.5 10*3/uL — ABNORMAL HIGH (ref 1.7–7.7)
Neutrophils Relative %: 79 %
Platelets: 343 10*3/uL (ref 150–400)
RBC: 2.95 MIL/uL — ABNORMAL LOW (ref 3.87–5.11)
RDW: 18.2 % — ABNORMAL HIGH (ref 11.5–15.5)
WBC: 13.1 10*3/uL — ABNORMAL HIGH (ref 4.0–10.5)
nRBC: 0 % (ref 0.0–0.2)

## 2020-08-26 LAB — BRAIN NATRIURETIC PEPTIDE: B Natriuretic Peptide: 659.2 pg/mL — ABNORMAL HIGH (ref 0.0–100.0)

## 2020-08-26 LAB — TROPONIN I (HIGH SENSITIVITY): Troponin I (High Sensitivity): 168 ng/L (ref <18)

## 2020-08-26 MED ORDER — METHYLPREDNISOLONE SODIUM SUCC 125 MG IJ SOLR
125.0000 mg | Freq: Once | INTRAMUSCULAR | Status: AC
  Administered 2020-08-26: 125 mg via INTRAVENOUS
  Filled 2020-08-26: qty 2

## 2020-08-26 MED ORDER — FUROSEMIDE 10 MG/ML IJ SOLN
40.0000 mg | Freq: Once | INTRAMUSCULAR | Status: AC
  Administered 2020-08-26: 40 mg via INTRAVENOUS
  Filled 2020-08-26: qty 4

## 2020-08-26 MED ORDER — IPRATROPIUM-ALBUTEROL 0.5-2.5 (3) MG/3ML IN SOLN
3.0000 mL | Freq: Once | RESPIRATORY_TRACT | Status: AC
Start: 2020-08-26 — End: 2020-08-26
  Administered 2020-08-26: 3 mL via RESPIRATORY_TRACT
  Filled 2020-08-26: qty 3

## 2020-08-26 MED ORDER — SODIUM CHLORIDE 0.9 % IV SOLN
100.0000 mg | Freq: Once | INTRAVENOUS | Status: AC
  Administered 2020-08-27: 100 mg via INTRAVENOUS
  Filled 2020-08-26: qty 100

## 2020-08-26 NOTE — ED Notes (Signed)
Pt placed on a purewick to promote comfort.

## 2020-08-26 NOTE — ED Provider Notes (Signed)
[facility] Provider Note   ____________________________________________   Event Date/Time   First MD Initiated Contact with Patient 08/26/20 2021     (approximate)  I have reviewed the triage vital signs and the nursing notes.   HISTORY  Chief Complaint Shortness of Breath    HPI Melanie King is a 75 y.o. female with below stated past medical history and significant for COPD and CHF who presents for worsening shortness of breath over the last 2 days.  Patient states that she does not wear any oxygen at home and felt that she could not walk more than 2 steps without becoming severely short of breath.  Patient states that she has been taking her medications on time and as prescribed.  Patient currently denies any vision changes, tinnitus, difficulty speaking, facial droop, sore throat, chest pain, abdominal pain, nausea/vomiting/diarrhea, dysuria, or weakness/numbness/paresthesias in any extremity         Past Medical History:  Diagnosis Date   Arthritis    CHF (congestive heart failure) (HCC)    COPD (chronic obstructive pulmonary disease) (HCC)    Coronary arteriosclerosis    DDD (degenerative disc disease), cervical    DDD (degenerative disc disease), lumbar    Depression    Diabetes mellitus without complication (HCC)    GERD (gastroesophageal reflux disease)    Hyperlipidemia    Hypertension    Osteoarthritis    Pacemaker    Tremor     Patient Active Problem List   Diagnosis Date Noted   Carotid stenosis 04/12/2020   Dysphagia 07/19/2019   Acute respiratory failure due to COVID-19 (HCC) 07/16/2019   Respiratory distress    Acute on chronic congestive heart failure (HCC)    Acute respiratory failure (HCC) 07/15/2019   Frequent falls 05/26/2019   Generalized weakness 05/26/2019   AKI (acute kidney injury) (HCC) 03/21/2019   Subdural hematoma (HCC) 03/20/2019   Cardiac pacemaker 03/17/2019   Hypothyroidism  03/10/2019   Depression    Chronic diastolic CHF (congestive heart failure) (HCC)    Acute metabolic encephalopathy    Fall    Tobacco abuse    C6 cervical fracture (HCC)    Left hand pain 04/01/2018   Numbness and tingling in left hand 04/01/2018   Left hand weakness 03/05/2018   Confusion 05/12/2017   Seizure (HCC) 05/10/2017   Elevated sedimentation rate 04/04/2017   Elevated C-reactive protein (CRP) 04/04/2017   Chest pain, unspecified 04/02/2017   Bilateral lower extremity pain (Secondary Area of Pain) (R>L) 04/02/2017   Chronic hip pain, bilateral  (Secondary Area of Pain) (R>L) 04/02/2017   Chronic bilateral low back pain with bilateral sciatica [facility] Area of Pain) (R>L) 04/02/2017   Chronic pain syndrome 04/02/2017   Disorder of bone, unspecified 04/02/2017   Other long term (current) drug therapy 04/02/2017   Other specified health status 04/02/2017   Long term current use of opiate analgesic 04/02/2017   Hip pain, bilateral 12/06/2016   Tremor 05/27/2016   Trigger finger of left hand 02/16/2016   Osteoarthritis of both hands 02/16/2016   Angina pectoris (HCC) 01/17/2016   COPD (chronic obstructive pulmonary disease) (HCC) 01/17/2016   CAD (coronary artery disease) 01/17/2016   HLD (hyperlipidemia) 01/17/2016   Hypertension 01/17/2016   Sick sinus syndrome (HCC) 01/17/2016   Chronic pain 01/17/2016   Personal history of tobacco use, presenting hazards to health 11/16/2015   Type 2 diabetes mellitus without complication, with long-term current use of insulin (HCC) 05/12/2013  Past Surgical History:  Procedure Laterality Date   ABDOMINAL HYSTERECTOMY     BREAST BIOPSY Right 1994   neg cyst removed   CATARACT EXTRACTION W/PHACO Right 08/17/2020   Procedure: CATARACT EXTRACTION PHACO AND INTRAOCULAR LENS PLACEMENT (IOC) RIGHT DIABETIC 8.90 00:52.9;  Surgeon: Galen Manila, MD;  Location: [facility];  Service: Ophthalmology;  Laterality: Right;   Diabetic   CHOLECYSTECTOMY     EYE SURGERY  1964, 1966, and 1967   bilateral   FOOT SURGERY Right    cellulitis   PACEMAKER INSERTION     PPM GENERATOR CHANGEOUT N/A 02/24/2020   Procedure: PPM GENERATOR CHANGEOUT;  Surgeon: Marcina Millard, MD;  Location: [facility];  Service: Cardiovascular;  Laterality: N/A;   SPINE SURGERY     cyst removed; [facility]    Prior to Admission medications   Medication Sig Start Date End Date Taking? Authorizing Provider  acetaminophen (TYLENOL) 500 MG tablet Take 1,000 mg by mouth every 6 (six) hours as needed for mild pain or moderate pain.    [provider]  amLODipine (NORVASC) 10 MG tablet TAKE 1 TABLET BY MOUTH EVERY DAY Patient taking differently: Take 10 mg by mouth daily. 08/28/16   Gabriel Cirri, NP  aspirin EC 81 MG tablet Take 81 mg by mouth daily.    [provider]  benztropine (COGENTIN) 0.5 MG tablet Take 0.5 mg by mouth 2 (two) times daily.    [provider]  brexpiprazole (REXULTI) 2 MG TABS tablet Take 1 mg by mouth at bedtime.    [provider]  calcitRIOL (ROCALTROL) 0.25 MCG capsule Take 0.25 mcg by mouth daily.    [provider]  cephALEXin (KEFLEX) 250 MG capsule Take 2 capsules (500 mg total) by mouth 2 (two) times daily. Patient not taking: Reported on 04/12/2020 02/24/20   Marcina Millard, MD  cholecalciferol (VITAMIN D3) 25 MCG (1000 UNIT) tablet Take 400 Units by mouth daily.     [provider]  citalopram (CELEXA) 20 MG tablet Take 20 mg by mouth daily.    [provider]  fenofibrate (TRICOR) 48 MG tablet Take 48 mg by mouth daily.    [provider]  fluticasone (FLONASE) 50 MCG/ACT nasal spray Place 2 sprays into both nostrils daily.    [provider]  furosemide (LASIX) 20 MG tablet Take 20 mg by mouth daily.    [provider]  gabapentin (NEURONTIN) 300 MG capsule Take 300 mg by mouth daily.    [provider]  hydrocortisone cream 1 % 1 application 2 (two) times daily. Rectally Patient not taking: No sig reported    [provider]  levothyroxine (SYNTHROID) 125 MCG tablet Take 1 tablet (125 mcg total) by mouth daily before breakfast. Patient taking differently: Take 150 mcg by mouth daily before breakfast. 05/28/19   Dhungel, Nishant, MD  losartan (COZAAR) 50 MG tablet Take 50 mg by mouth daily.    [provider]  magnesium oxide (MAG-OX) 400 MG tablet Take 400 mg by mouth daily.    [provider]  metFORMIN (GLUCOPHAGE) 1000 MG tablet Take 0.5 tablets (500 mg total) by mouth 2 (two) times daily with a meal. 05/28/19   Dhungel, Nishant, MD  metoprolol tartrate (LOPRESSOR) 25 MG tablet Take 25 mg by mouth 2 (two) times daily.    [provider]  montelukast (SINGULAIR) 10 MG tablet Take 10 mg by mouth daily.    [provider]  nitroGLYCERIN (  NITRODUR - DOSED IN MG/24 HR) 0.2 mg/hr patch Place 0.2 mg onto the skin daily. leave patch on 12-14 hours, then remove for 10-12 hours prior to applying the next patch  Patient not taking: Reported on 08/06/2020    [provider]  pantoprazole (PROTONIX) 40 MG tablet Take 40 mg by mouth daily.    [provider]  primidone (MYSOLINE) 50 MG tablet Take 50 mg by mouth 2 (two) times daily.    [provider]  senna-docusate (SENOKOT-S) 8.6-50 MG tablet Take 2 tablets by mouth daily.    [provider]  sitaGLIPtin (JANUVIA) 50 MG tablet Take 50 mg by mouth daily.    [provider]  traZODone (DESYREL) 50 MG tablet Take by mouth at bedtime as needed for sleep.    [provider]  vitamin B-12 (CYANOCOBALAMIN) 1000 MCG tablet Take 2,000 mcg by mouth daily.    [provider]    Allergies Alprazolam, Fentanyl, Meperidine, Meperidine hcl, and Ranitidine hcl  Family History  Problem Relation Age of Onset   Breast cancer Maternal Aunt 640    Cancer Mother        colon   Aneurysm Father     Social History Social History   Tobacco Use   Smoking status: Every Day    Packs/day: 1.00    Years: 50.00    Pack years: 50.00    Types: Cigarettes   Smokeless tobacco: Never  Vaping Use   Vaping Use: Every day  Substance Use Topics   Alcohol use: No   Drug use: No    Review of Systems Constitutional: No fever/chills Eyes: No visual changes. ENT: No sore throat. Cardiovascular: Denies chest pain. Respiratory: Endorses shortness of breath and cough productive of white sputum Gastrointestinal: No abdominal pain.  No nausea, no vomiting.  No diarrhea. Genitourinary: Negative for dysuria. Musculoskeletal: Negative for acute arthralgias Skin: Negative for rash. Neurological: Negative for headaches, weakness/numbness/paresthesias in any extremity Psychiatric: Negative for suicidal ideation/homicidal ideation   ____________________________________________   PHYSICAL EXAM:  VITAL SIGNS: ED Triage Vitals  Enc Vitals Group     BP 08/26/20 2020 (!) 173/58     Pulse Rate 08/26/20 2020 73     Resp 08/26/20 2020 (!) 22     Temp 08/26/20 2020 98.4 F (36.9 C)     Temp Source 08/26/20 2020 Oral     SpO2 08/26/20 2019 98 %     Weight 08/26/20 2019 146 lb (66.2 kg)     Height 08/26/20 2019 5\' 1"  (1.549 m)     Head Circumference --      Peak Flow --      Pain Score 08/26/20 2020 0     Pain Loc --      Pain Edu? --      Excl. in GC? --    Constitutional: Alert and oriented. Well appearing and in no acute distress. Eyes: Conjunctivae are normal. PERRL. Head: Atraumatic. Nose: No congestion/rhinnorhea. Mouth/Throat: Mucous membranes are moist. Neck: No stridor Cardiovascular: Grossly normal heart sounds.  Good peripheral circulation. Respiratory: Increased respiratory effort.  Rales and expiratory wheezes over all lung fields.  No retractions. Gastrointestinal: Soft and nontender. No distention. Musculoskeletal: No  obvious deformities Neurologic:  Normal speech and language. No gross focal neurologic deficits are appreciated. Skin:  Skin is warm and dry. No rash noted. Psychiatric: Mood and affect are normal. Speech and behavior are normal.  ____________________________________________   LABS (all labs ordered are listed, but  only abnormal results are displayed)  Labs Reviewed  COMPREHENSIVE METABOLIC PANEL - Abnormal; Notable for the following components:      Result Value   Sodium 132 (*)    Glucose, Bld 225 (*)    BUN 36 (*)    Creatinine, Ser 1.79 (*)    AST 14 (*)    GFR, Estimated 29 (*)    All other components within normal limits  BRAIN NATRIURETIC PEPTIDE - Abnormal; Notable for the following components:   B Natriuretic Peptide 659.2 (*)    All other components within normal limits  CBC WITH DIFFERENTIAL/PLATELET - Abnormal; Notable for the following components:   WBC 13.1 (*)    RBC 2.95 (*)    Hemoglobin 9.2 (*)    HCT 27.9 (*)    RDW 18.2 (*)    Neutro Abs 10.5 (*)    Abs Immature Granulocytes 0.09 (*)    All other components within normal limits  TROPONIN I (HIGH SENSITIVITY) - Abnormal; Notable for the following components:   Troponin I (High Sensitivity) 168 (*)    All other components within normal limits  RESP PANEL BY RT-PCR (FLU A&B, COVID) ARPGX2  TROPONIN I (HIGH SENSITIVITY)   ____________________________________________  EKG  ED ECG REPORT I, Merwyn Katos, the attending physician, personally viewed and interpreted this ECG.  Date: 08/26/2020 EKG Time: 2021 Rate: 73 Rhythm: normal sinus rhythm QRS Axis: normal Intervals: Right bundle branch block, left anterior fascicular block ST/T Wave abnormalities: normal Narrative Interpretation: Right bundle branch block with left anterior fascicular block that is unchanged from previous.  No evidence of acute ischemia  ____________________________________________  RADIOLOGY  ED MD interpretation: Single  view portable chest x-ray shows diffuse interstitial pulmonary edema  Official radiology report(s): DG Chest Port 1 View  Result Date: 08/26/2020 CLINICAL DATA:  Dyspnea EXAM: PORTABLE CHEST 1 VIEW COMPARISON:  08/05/2020 FINDINGS: The lungs are symmetrically well expanded. No pneumothorax or pleural effusion. The pulmonary vascularity is diffusely increased with moderate diffuse interstitial pulmonary edema present in keeping with changes of moderate cardiogenic failure. Cardiac size within normal limits. Left subclavian dual lead pacemaker is unchanged. No acute bone abnormality. IMPRESSION: Moderate cardiogenic failure. Electronically Signed   By: Helyn Numbers MD   On: 08/26/2020 21:42    ____________________________________________   PROCEDURES  Procedure(s) performed (including Critical Care):  .Critical Care  Date/Time: 08/26/2020 11:25 PM Performed by: Merwyn Katos, MD Authorized by: Merwyn Katos, MD   Critical care provider statement:    Critical care time (minutes):  37   Critical care time was exclusive of:  Separately billable procedures and treating other patients   Critical care was necessary to treat or prevent imminent or life-threatening deterioration of the following conditions:  Respiratory failure   Critical care was time spent personally by me on the following activities:  Discussions with consultants, evaluation of patient's response to treatment, examination of patient, ordering and performing treatments and interventions, ordering and review of laboratory studies, ordering and review of radiographic studies, pulse oximetry, re-evaluation of patient's condition, obtaining history from patient or surrogate and review of old charts   I assumed direction of critical care for this patient from another provider in my specialty: no     Care discussed with: admitting provider   .1-3 Lead EKG Interpretation  Date/Time: 08/26/2020 11:25 PM Performed by: Merwyn Katos, MD Authorized by: Merwyn Katos, MD     Interpretation: normal     ECG rate:  70   ECG rate assessment: normal     Rhythm: sinus rhythm     Ectopy: none     Conduction: normal     ____________________________________________   INITIAL IMPRESSION / ASSESSMENT AND PLAN / ED COURSE  As part of my medical decision making, I reviewed the following data within the electronic medical record, if available:  Nursing notes reviewed and incorporated, Labs reviewed, EKG interpreted, Old chart reviewed, Radiograph reviewed and Notes from prior ED visits reviewed and incorporated        The patient appears to be suffering from a moderate/severe exacerbation of COPD with addition of heart failure exacerbation.  Based on the history, exam, CXR/EKG reviewed by me, and further workup I dont suspect any other emergent cause of this presentation, such as pneumonia, acute coronary syndrome, congestive heart failure, pulmonary embolism, or pneumothorax.  ED Interventions: bronchodilators, steroids, antibiotics, Lasix, reassess BNP 659 Troponin 168 Reassessment: After treatment, the patients shortness of breath is improving but patient is still requiring supplemental oxygenation   Disposition: Admit      ____________________________________________   FINAL CLINICAL IMPRESSION(S) / ED DIAGNOSES  Final diagnoses:  COPD exacerbation (HCC)  Acute on chronic congestive heart failure, unspecified heart failure type (HCC)  Acute respiratory failure with hypoxia (HCC)  SOB (shortness of breath)     ED Discharge Orders     None        Note:  This document was prepared using Dragon voice recognition software and may include unintentional dictation errors.    Merwyn Katos, MD 08/26/20 [phone]

## 2020-08-26 NOTE — ED Triage Notes (Signed)
Pt presents from the [facility] with complaints of SOB - hx of CHF & has a pacemaker. Pts O2 sats were 93% on RA - and endorses a sore throat all day.

## 2020-08-26 NOTE — ED Notes (Signed)
Lab called to check on labs that were sent not in process. Per lab they did not have any blood work or COVID swab that was sent and that "it might be lost in tube station when it went down." New labs and COVID swab to be drawn/ sent at this time.

## 2020-08-27 ENCOUNTER — Other Ambulatory Visit: Payer: Self-pay

## 2020-08-27 ENCOUNTER — Inpatient Hospital Stay: Admit: 2020-08-27 | Payer: Medicare Other

## 2020-08-27 ENCOUNTER — Inpatient Hospital Stay
Admit: 2020-08-27 | Discharge: 2020-08-27 | Disposition: A | Discharge status: Home / Self Care | Payer: Medicare Other | Attending: Internal Medicine | Admitting: Internal Medicine

## 2020-08-27 ENCOUNTER — Encounter: Payer: Self-pay | Admitting: Internal Medicine

## 2020-08-27 DIAGNOSIS — I5033 Acute on chronic diastolic (congestive) heart failure: Secondary | ICD-10-CM

## 2020-08-27 DIAGNOSIS — J441 Chronic obstructive pulmonary disease with (acute) exacerbation: Secondary | ICD-10-CM

## 2020-08-27 LAB — ECHOCARDIOGRAM COMPLETE
AR max vel: 1.42 cm2
AV Area VTI: 1.61 cm2
AV Area mean vel: 1.36 cm2
AV Mean grad: 10 mmHg
AV Peak grad: 15.5 mmHg
Ao pk vel: 1.97 m/s
Area-P 1/2: 3.21 cm2
Calc EF: 59.7 %
Height: 61 in
MV VTI: 1.67 cm2
S' Lateral: 2.8 cm
Single Plane A2C EF: 53.8 %
Single Plane A4C EF: 57.7 %
Weight: 2336 oz

## 2020-08-27 LAB — VITAMIN B12: Vitamin B-12: 6040 pg/mL — ABNORMAL HIGH (ref 180–914)

## 2020-08-27 LAB — RESP PANEL BY RT-PCR (FLU A&B, COVID) ARPGX2
Influenza A by PCR: NEGATIVE
Influenza B by PCR: NEGATIVE
SARS Coronavirus 2 by RT PCR: NEGATIVE

## 2020-08-27 LAB — MRSA NEXT GEN BY PCR, NASAL: MRSA by PCR Next Gen: NOT DETECTED

## 2020-08-27 LAB — GLUCOSE, CAPILLARY
Glucose-Capillary: 264 mg/dL — ABNORMAL HIGH (ref 70–99)
Glucose-Capillary: 247 mg/dL — ABNORMAL HIGH (ref 70–99)
Glucose-Capillary: 393 mg/dL — ABNORMAL HIGH (ref 70–99)

## 2020-08-27 LAB — IRON AND TIBC
Iron: 65 ug/dL (ref 28–170)
Saturation Ratios: 15 % (ref 10.4–31.8)
TIBC: 448 ug/dL (ref 250–450)
UIBC: 383 ug/dL

## 2020-08-27 LAB — HEMOGLOBIN A1C
Hgb A1c MFr Bld: 7.8 % — ABNORMAL HIGH (ref 4.8–5.6)
Mean Plasma Glucose: 177.16 mg/dL

## 2020-08-27 LAB — HIV ANTIBODY (ROUTINE TESTING W REFLEX): HIV Screen 4th Generation wRfx: NONREACTIVE

## 2020-08-27 LAB — TROPONIN I (HIGH SENSITIVITY): Troponin I (High Sensitivity): 140 ng/L (ref <18)

## 2020-08-27 LAB — VITAMIN D 25 HYDROXY (VIT D DEFICIENCY, FRACTURES): Vit D, 25-Hydroxy: 20.78 ng/mL — ABNORMAL LOW (ref 30–100)

## 2020-08-27 LAB — CBG MONITORING, ED: Glucose-Capillary: 361 mg/dL — ABNORMAL HIGH (ref 70–99)

## 2020-08-27 LAB — FOLATE: Folate: 7.4 ng/mL (ref >5.9)

## 2020-08-27 MED ORDER — INSULIN ASPART 100 UNIT/ML IJ SOLN
0.0000 [IU] | Freq: Three times a day (TID) | INTRAMUSCULAR | Status: DC
  Administered 2020-08-27: 15 [IU] via SUBCUTANEOUS
  Administered 2020-08-27: 5 [IU] via SUBCUTANEOUS
  Administered 2020-08-27: 8 [IU] via SUBCUTANEOUS
  Administered 2020-08-28 (×3): 11 [IU] via SUBCUTANEOUS
  Administered 2020-08-29 (×2): 8 [IU] via SUBCUTANEOUS
  Administered 2020-08-29: 11 [IU] via SUBCUTANEOUS
  Administered 2020-08-30: 15 [IU] via SUBCUTANEOUS
  Filled 2020-08-27 (×10): qty 1

## 2020-08-27 MED ORDER — AMLODIPINE BESYLATE 10 MG PO TABS
10.0000 mg | ORAL_TABLET | Freq: Every day | ORAL | Status: DC
  Administered 2020-08-27 – 2020-08-30 (×4): 10 mg via ORAL
  Filled 2020-08-27 (×5): qty 1

## 2020-08-27 MED ORDER — FLUTICASONE FUROATE-VILANTEROL 200-25 MCG/INH IN AEPB
1.0000 | INHALATION_SPRAY | Freq: Every day | RESPIRATORY_TRACT | Status: DC
  Administered 2020-08-27 – 2020-08-30 (×4): 1 via RESPIRATORY_TRACT
  Filled 2020-08-27: qty 28

## 2020-08-27 MED ORDER — NITROGLYCERIN 2 % TD OINT
0.5000 [in_us] | TOPICAL_OINTMENT | Freq: Four times a day (QID) | TRANSDERMAL | Status: DC
  Administered 2020-08-27 – 2020-08-30 (×14): 0.5 [in_us] via TOPICAL
  Filled 2020-08-27 (×14): qty 1

## 2020-08-27 MED ORDER — SODIUM CHLORIDE 0.9% FLUSH: 3.0000 mL | INTRAVENOUS | Status: DC | PRN

## 2020-08-27 MED ORDER — METHYLPREDNISOLONE SODIUM SUCC 40 MG IJ SOLR
40.0000 mg | Freq: Two times a day (BID) | INTRAMUSCULAR | Status: DC
  Administered 2020-08-27 – 2020-08-29 (×5): 40 mg via INTRAVENOUS
  Filled 2020-08-27 (×5): qty 1

## 2020-08-27 MED ORDER — SODIUM CHLORIDE 0.9 % IV SOLN: 250.0000 mL | INTRAVENOUS | Status: DC | PRN

## 2020-08-27 MED ORDER — METOPROLOL TARTRATE 25 MG PO TABS
25.0000 mg | ORAL_TABLET | Freq: Two times a day (BID) | ORAL | Status: DC
  Administered 2020-08-27 – 2020-08-28 (×4): 25 mg via ORAL
  Filled 2020-08-27 (×5): qty 1

## 2020-08-27 MED ORDER — INSULIN ASPART 100 UNIT/ML IJ SOLN
0.0000 [IU] | Freq: Every day | INTRAMUSCULAR | Status: DC
  Administered 2020-08-27: 5 [IU] via SUBCUTANEOUS
  Administered 2020-08-29: 4 [IU] via SUBCUTANEOUS
  Administered 2020-08-29: 2 [IU] via SUBCUTANEOUS
  Filled 2020-08-27 (×3): qty 1

## 2020-08-27 MED ORDER — FUROSEMIDE 10 MG/ML IJ SOLN: 20.0000 mg | Freq: Once | INTRAMUSCULAR | Status: AC

## 2020-08-27 MED ORDER — ENOXAPARIN SODIUM 30 MG/0.3ML IJ SOSY
30.0000 mg | PREFILLED_SYRINGE | INTRAMUSCULAR | Status: DC
  Administered 2020-08-27 – 2020-08-30 (×4): 30 mg via SUBCUTANEOUS
  Filled 2020-08-27 (×6): qty 0.3

## 2020-08-27 MED ORDER — ONDANSETRON HCL 4 MG/2ML IJ SOLN: 4.0000 mg | Freq: Four times a day (QID) | INTRAMUSCULAR | Status: DC | PRN

## 2020-08-27 MED ORDER — ALBUTEROL SULFATE (2.5 MG/3ML) 0.083% IN NEBU
2.5000 mg | INHALATION_SOLUTION | RESPIRATORY_TRACT | Status: DC | PRN
  Administered 2020-08-28: 2.5 mg via RESPIRATORY_TRACT
  Filled 2020-08-27: qty 3

## 2020-08-27 MED ORDER — IPRATROPIUM-ALBUTEROL 0.5-2.5 (3) MG/3ML IN SOLN
3.0000 mL | Freq: Four times a day (QID) | RESPIRATORY_TRACT | Status: DC
  Administered 2020-08-27 – 2020-08-29 (×11): 3 mL via RESPIRATORY_TRACT
  Filled 2020-08-27: qty 9
  Filled 2020-08-27 (×10): qty 3

## 2020-08-27 MED ORDER — ENSURE MAX PROTEIN PO LIQD
11.0000 [oz_av] | Freq: Two times a day (BID) | ORAL | Status: DC
  Administered 2020-08-27 – 2020-08-29 (×4): 11 [oz_av] via ORAL
  Filled 2020-08-27: qty 330

## 2020-08-27 MED ORDER — ADULT MULTIVITAMIN W/MINERALS CH
1.0000 | ORAL_TABLET | Freq: Every day | ORAL | Status: DC
  Administered 2020-08-27 – 2020-08-30 (×4): 1 via ORAL
  Filled 2020-08-27 (×4): qty 1

## 2020-08-27 MED ORDER — FUROSEMIDE 10 MG/ML IJ SOLN
40.0000 mg | Freq: Two times a day (BID) | INTRAMUSCULAR | Status: DC
  Administered 2020-08-27 – 2020-08-30 (×7): 40 mg via INTRAVENOUS
  Filled 2020-08-27 (×7): qty 4

## 2020-08-27 MED ORDER — ACETAMINOPHEN 325 MG PO TABS
650.0000 mg | ORAL_TABLET | ORAL | Status: DC | PRN
  Administered 2020-08-27: 650 mg via ORAL
  Filled 2020-08-27: qty 2

## 2020-08-27 MED ORDER — METHOCARBAMOL 500 MG PO TABS
500.0000 mg | ORAL_TABLET | Freq: Three times a day (TID) | ORAL | Status: DC | PRN
  Administered 2020-08-27 – 2020-08-28 (×3): 500 mg via ORAL
  Filled 2020-08-27 (×5): qty 1

## 2020-08-27 MED ORDER — FUROSEMIDE 10 MG/ML IJ SOLN
INTRAMUSCULAR | Status: AC
  Administered 2020-08-27: 20 mg via INTRAVENOUS
  Filled 2020-08-27: qty 4

## 2020-08-27 MED ORDER — SODIUM CHLORIDE 0.9% FLUSH
3.0000 mL | Freq: Two times a day (BID) | INTRAVENOUS | Status: DC
  Administered 2020-08-27 – 2020-08-29 (×7): 3 mL via INTRAVENOUS

## 2020-08-27 NOTE — H&P (Signed)
History and Physical    Melanie F Hitchcock TIR:[id number] DOB: February 01, 1946 DOA: 08/26/2020  PCP: Marguarite Arbour, MD   Patient coming from: home  I have personally briefly reviewed patient's old medical records in [facility]  Chief Complaint: shortness of breath  HPI: Melanie King is a 75 y.o. female with medical history significant for Diastolic heart failure, CAD, pacemaker, CKD 3, HTN, diabetes, who presents to the emergency room with a 1 month history of shortness of breath with exertion and moving from room to room and a several day history of orthopnea that became acutely worse on the night of admission.  She denies chest pain though she states it feels like a fist in the middle of her chest.  Denies palpitations, nausea vomiting or diaphoresis.  Denies cough, fever or chills.  Has no lower extremity pain or swelling.  She has no abdominal pain or diarrhea. ED course: On arrival, afebrile, BP 173/58 with pulse of 76 respirations 22 with O2 sat in the low 90s on room air.  Blood work significant for first troponin of 168 and BNP 659.  Creatinine 1.79, up from baseline of 1.47.  WBC 13,000 with hemoglobin 9.2 down from a baseline of 11.5 three weeks prior. EKG, personally viewed and interpreted NSR at 75 with RBBB and LAFB and no acute ST-T wave changes Imaging: CXR with moderate cardiogenic failure  Patient treated with IV Lasix and also with duo nebs.  Hospitalist consulted for admission  Review of Systems: As per HPI otherwise all other systems on review of systems negative.    Past Medical History:  Diagnosis Date   Arthritis    CHF (congestive heart failure) (HCC)    COPD (chronic obstructive pulmonary disease) (HCC)    Coronary arteriosclerosis    DDD (degenerative disc disease), cervical    DDD (degenerative disc disease), lumbar    Depression    Diabetes mellitus without complication (HCC)    GERD (gastroesophageal reflux disease)    Hyperlipidemia    Hypertension     Osteoarthritis    Pacemaker    Tremor     Past Surgical History:  Procedure Laterality Date   ABDOMINAL HYSTERECTOMY     BREAST BIOPSY Right 1994   neg cyst removed   CATARACT EXTRACTION W/PHACO Right 08/17/2020   Procedure: CATARACT EXTRACTION PHACO AND INTRAOCULAR LENS PLACEMENT (IOC) RIGHT DIABETIC 8.90 00:52.9;  Surgeon: Galen Manila, MD;  Location: [facility];  Service: Ophthalmology;  Laterality: Right;  Diabetic   CHOLECYSTECTOMY     EYE SURGERY  1964, 1966, and 1967   bilateral   FOOT SURGERY Right    cellulitis   PACEMAKER INSERTION     PPM GENERATOR CHANGEOUT N/A 02/24/2020   Procedure: PPM GENERATOR CHANGEOUT;  Surgeon: Marcina Millard, MD;  Location: [facility];  Service: Cardiovascular;  Laterality: N/A;   SPINE SURGERY     cyst removed; [facility]     reports that she has been smoking cigarettes. She has a 50.00 pack-year smoking history. She has never used smokeless tobacco. She reports that she does not drink alcohol and does not use drugs.  Allergies  Allergen Reactions   Alprazolam Swelling   Fentanyl Other (See Comments)    "burning and hot"   Meperidine Itching    Other reaction(s): Other (See Comments)   Meperidine Hcl    Ranitidine Hcl     Other reaction(s): Other (See Comments)    Family History  Problem Relation Age  of Onset   Breast cancer Maternal Aunt 52   Cancer Mother        colon   Aneurysm Father       Prior to Admission medications   Medication Sig Start Date End Date Taking? Authorizing Provider  amLODipine (NORVASC) 10 MG tablet TAKE 1 TABLET BY MOUTH EVERY DAY Patient taking differently: Take 10 mg by mouth daily. 08/28/16  Yes Gabriel Cirri, NP  ASPIRIN LOW DOSE 81 MG chewable tablet Chew 81 mg by mouth daily. 07/27/20  Yes [provider]  benztropine (COGENTIN) 0.5 MG tablet Take 0.5 mg by mouth 2 (two) times daily.   Yes [provider]  calcitRIOL (ROCALTROL) 0.25 MCG  capsule Take 0.25 mcg by mouth daily.   Yes [provider]  carbidopa-levodopa (SINEMET IR) 25-100 MG tablet Take 1 tablet by mouth 3 (three) times daily. 07/27/20  Yes [provider]  citalopram (CELEXA) 20 MG tablet Take 20 mg by mouth daily.   Yes [provider]  Difluprednate 0.05 % EMUL Apply to eye.   Yes [provider]  DUREZOL 0.05 % EMUL Apply 1 drop to eye 2 (two) times daily. 07/28/20  Yes [provider]  fenofibrate (TRICOR) 48 MG tablet Take 48 mg by mouth daily.   Yes [provider]  fluticasone (FLONASE) 50 MCG/ACT nasal spray Place 2 sprays into both nostrils daily.   Yes [provider]  gabapentin (NEURONTIN) 100 MG capsule Take 100 mg by mouth at bedtime.   Yes [provider]  Lactobacillus Acid-Pectin (ACIDOPHILUS/PECTIN) CAPS Take 1 capsule by mouth in the morning and at bedtime. 07/27/20  Yes [provider]  levothyroxine (SYNTHROID) 150 MCG tablet Take 150 mcg by mouth daily. 07/27/20  Yes [provider]  losartan (COZAAR) 50 MG tablet Take 50 mg by mouth daily.   Yes [provider]  magnesium oxide (MAG-OX) 400 MG tablet Take 400 mg by mouth daily.   Yes [provider]  meloxicam (MOBIC) 7.5 MG tablet Take 7.5 mg by mouth daily. 08/17/20  Yes [provider]  metFORMIN (GLUCOPHAGE) 1000 MG tablet Take 0.5 tablets (500 mg total) by mouth 2 (two) times daily with a meal. 05/28/19  Yes Dhungel, Nishant, MD  metoprolol tartrate (LOPRESSOR) 25 MG tablet Take 25 mg by mouth 2 (two) times daily.   Yes [provider]  montelukast (SINGULAIR) 10 MG tablet Take 10 mg by mouth daily.   Yes [provider]  pantoprazole (PROTONIX) 40 MG tablet Take 40 mg by mouth daily.   Yes [provider]  primidone (MYSOLINE) 50 MG tablet Take 50 mg by mouth 2 (two) times daily.   Yes [provider]  sitaGLIPtin (JANUVIA) 50 MG tablet Take 50  mg by mouth daily.   Yes [provider]  acetaminophen (TYLENOL) 500 MG tablet Take 1,000 mg by mouth every 6 (six) hours as needed for mild pain or moderate pain.    [provider]  aspirin EC 81 MG tablet Take 81 mg by mouth daily. Patient not taking: Reported on 08/27/2020    [provider]  brexpiprazole (REXULTI) 2 MG TABS tablet Take 1 mg by mouth at bedtime.    [provider]  cephALEXin (KEFLEX) 250 MG capsule Take 2 capsules (500 mg total) by mouth 2 (two) times daily. Patient not taking: Reported on 04/12/2020 02/24/20   Marcina Millard, MD  cholecalciferol (VITAMIN D3) 25 MCG (1000 UNIT) tablet Take 400 Units  by mouth daily.     [provider]  furosemide (LASIX) 20 MG tablet Take 20 mg by mouth daily.    [provider]  gabapentin (NEURONTIN) 300 MG capsule Take 300 mg by mouth daily. Patient not taking: Reported on 08/27/2020    [provider]  hydrocortisone cream 1 % 1 application 2 (two) times daily. Rectally Patient not taking: No sig reported    [provider]  nitroGLYCERIN (NITRODUR - DOSED IN MG/24 HR) 0.2 mg/hr patch Place 0.2 mg onto the skin daily. leave patch on 12-14 hours, then remove for 10-12 hours prior to applying the next patch  Patient not taking: Reported on 08/06/2020    [provider]  senna-docusate (SENOKOT-S) 8.6-50 MG tablet Take 2 tablets by mouth daily.    [provider]  traZODone (DESYREL) 50 MG tablet Take by mouth at bedtime as needed for sleep.    [provider]  vitamin B-12 (CYANOCOBALAMIN) 1000 MCG tablet Take 2,000 mcg by mouth daily.    [provider]    Physical Exam: Vitals:   08/26/20 2330 08/27/20 0000 08/27/20 0030 08/27/20 0100  BP: (!) 170/78 (!) 187/76 (!) 185/75 (!) 196/98  Pulse: 75 66 69 69  Resp: (!) 23 (!) 23 17 14   Temp:      TempSrc:      SpO2: 95% 95% 97% 99%  Weight:      Height:         Vitals:    08/26/20 2330 08/27/20 0000 08/27/20 0030 08/27/20 0100  BP: (!) 170/78 (!) 187/76 (!) 185/75 (!) 196/98  Pulse: 75 66 69 69  Resp: (!) 23 (!) 23 17 14   Temp:      TempSrc:      SpO2: 95% 95% 97% 99%  Weight:      Height:          Constitutional: Alert and oriented x 3 .  Conversational dyspnea HEENT:      Head: Normocephalic and atraumatic.         Eyes: PERLA, EOMI, Conjunctivae are normal. Sclera is non-icteric.       Mouth/Throat: Mucous membranes are moist.       Neck: Supple with no signs of meningismus. Cardiovascular: Regular rate and rhythm. No murmurs, gallops, or rubs. 2+ symmetrical distal pulses are present . No JVD. No LE edema Respiratory: Respiratory effort increased.Lungs sounds diminished bilaterally.  Scattered wheezes bilaterally gastrointestinal: Soft, non tender, and non distended with positive bowel sounds.  Genitourinary: No CVA tenderness. Musculoskeletal: Nontender with normal range of motion in all extremities. No cyanosis, or erythema of extremities. Neurologic:  Face is symmetric. Moving all extremities. No gross focal neurologic deficits . Skin: Skin is warm, dry.  No rash or ulcers Psychiatric: Mood and affect are normal    Labs on Admission: I have personally reviewed following labs and imaging studies  CBC: Recent Labs  Lab 08/26/20 2239  WBC 13.1*  NEUTROABS 10.5*  HGB 9.2*  HCT 27.9*  MCV 94.6  PLT 343   Basic Metabolic Panel: Recent Labs  Lab 08/26/20 2239  NA 132*  K 4.5  CL 99  CO2 25  GLUCOSE 225*  BUN 36*  CREATININE 1.79*  CALCIUM 9.0   GFR: Estimated Creatinine Clearance: 24 mL/min (A) (by C-G formula based on SCr of 1.79 mg/dL (H)). Liver Function Tests: Recent Labs  Lab 08/26/20 2239  AST 14*  ALT <5  ALKPHOS 76  BILITOT 0.7  PROT  7.6  ALBUMIN 3.6   No results for input(s): LIPASE, AMYLASE in the last 168 hours. No results for input(s): AMMONIA in the last 168 hours. Coagulation Profile: No results for  input(s): INR, PROTIME in the last 168 hours. Cardiac Enzymes: No results for input(s): CKTOTAL, CKMB, CKMBINDEX, TROPONINI in the last 168 hours. BNP (last 3 results) No results for input(s): PROBNP in the last 8760 hours. HbA1C: No results for input(s): HGBA1C in the last 72 hours. CBG: No results for input(s): GLUCAP in the last 168 hours. Lipid Profile: No results for input(s): CHOL, HDL, LDLCALC, TRIG, CHOLHDL, LDLDIRECT in the last 72 hours. Thyroid Function Tests: No results for input(s): TSH, T4TOTAL, FREET4, T3FREE, THYROIDAB in the last 72 hours. Anemia Panel: No results for input(s): VITAMINB12, FOLATE, FERRITIN, TIBC, IRON, RETICCTPCT in the last 72 hours. Urine analysis:    Component Value Date/Time   COLORURINE STRAW (A) 09/17/2019 0847   APPEARANCEUR CLEAR (A) 09/17/2019 0847   APPEARANCEUR Clear 03/24/2013 1530   LABSPEC 1.005 09/17/2019 0847   LABSPEC 1.009 03/24/2013 1530   PHURINE 8.0 09/17/2019 0847   GLUCOSEU NEGATIVE 09/17/2019 0847   GLUCOSEU 50 mg/dL 28/78/6767 2094   HGBUR NEGATIVE 09/17/2019 0847   BILIRUBINUR NEGATIVE 09/17/2019 0847   BILIRUBINUR Negative 03/24/2013 1530   KETONESUR NEGATIVE 09/17/2019 0847   PROTEINUR 30 (A) 09/17/2019 0847   NITRITE NEGATIVE 09/17/2019 0847   LEUKOCYTESUR NEGATIVE 09/17/2019 0847   LEUKOCYTESUR Negative 03/24/2013 1530    Radiological Exams on Admission: DG Chest Port 1 View  Result Date: 08/26/2020 CLINICAL DATA:  Dyspnea EXAM: PORTABLE CHEST 1 VIEW COMPARISON:  08/05/2020 FINDINGS: The lungs are symmetrically well expanded. No pneumothorax or pleural effusion. The pulmonary vascularity is diffusely increased with moderate diffuse interstitial pulmonary edema present in keeping with changes of moderate cardiogenic failure. Cardiac size within normal limits. Left subclavian dual lead pacemaker is unchanged. No acute bone abnormality. IMPRESSION: Moderate cardiogenic failure. Electronically Signed   By: Helyn Numbers MD   On: 08/26/2020 21:42     Assessment/Plan 75 year old female with history of diastolic heart failure, CAD, pacemaker, CKD 3, HTN, diabetes, who presents to the emergency room with a 1 month history of shortness of breath with exertion and moving from room to room and a several day history of orthopnea that became acutely worse on the night of admission.       Acute on chronic diastolic CHF  -Patient with shortness of breath, orthopnea, BNP 659 and chest x-ray with moderate cardiogenic failure -IV Lasix - Continue home beta-blocker and ACE/ARB - Daily weights, intake and output monitoring - Echocardiogram in the a.m. - Cardiology consult    COPD with mild acute exacerbation (HCC) - Schedule and as needed bronchodilator - No steroids or antibiotics ordered at this time    Elevated troponin   CAD (coronary artery disease) - Troponin [phone], EKG nonacute - Likely demand ischemia - Continue home antiplatelet and statin and beta-blocker    Acute kidney injury superimposed on CKD lllb (HCC) - Creatinine 1.79, up from baseline 1.47 - Continue to monitor    Hypertension - Chronic and stable    Type 2 diabetes mellitus without complication, - Sliding scale insulin coverage  Cardiac pacemaker -Pacemaker interrogation    DVT prophylaxis: Lovenox  Code Status: full code  Family Communication:  none  Disposition Plan: Back to previous home environment Consults called: Cardiology Status:At the time of admission, it appears that the appropriate admission status for this patient is INPATIENT.  This is judged to be reasonable and necessary in order to provide the required intensity of service to ensure the patient's safety given the presenting symptoms, physical exam findings, and initial radiographic and laboratory data in the context of their  Comorbid conditions.   Patient requires inpatient status due to high intensity of service, high risk for further deterioration and  high frequency of surveillance required.   I certify that at the point of admission it is my clinical judgment that the patient will require inpatient hospital care spanning beyond 2 midnights     Andris Baumann MD [facility]     08/27/2020, 1:22 AM

## 2020-08-27 NOTE — Evaluation (Signed)
Occupational Therapy Evaluation Patient Details Name: Melanie King MRN: [id number] DOB: May 31, 1945 Today's Date: 08/27/2020    History of Present Illness Melanie King is a 75 y.o. female with medical history significant for Diastolic heart failure, CAD, pacemaker, CKD 3, HTN, diabetes, who presents to the emergency room with a 1 month history of shortness of breath with exertion and moving from room to room and a several day history of orthopnea that became acutely worse on the night of admission.   Clinical Impression   Melanie King was seen for OT evaluation this date. Pt endorses living in an assisted living facility where staff assist her with bathing, dressing, and IADL tasks. Pt currently requires MIN A for exertional ADL management due to current functional impairments (See OT Problem List below). Pt educated in energy conservation strategies including pursed lip breathing, activity pacing, home/routines modifications, work simplification, AE/DME, prioritizing of meaningful occupations, and falls prevention. Pt is impulsive with mobility and unfamiliar with restraints of O2 tubing with ambulation. She denies use of O2 in the home. She requires consistent cueing for safety during functional mobility this date. Pt verbalized understanding and would benefit from additional skilled OT services to maximize recall and carryover of learned techniques and facilitate implementation of learned techniques into daily routines. Upon discharge, recommend HHOT services.       Follow Up Recommendations  Home health OT    Equipment Recommendations  Other (comment) (TBD)    Recommendations for Other Services       Precautions / Restrictions Precautions Precautions: Fall Restrictions Weight Bearing Restrictions: No      Mobility Bed Mobility Overal bed mobility: Needs Assistance Bed Mobility: Supine to Sit;Sit to Supine     Supine to sit: Supervision Sit to supine: Supervision         Transfers Overall transfer level: Needs assistance Equipment used: Rolling walker (2 wheeled);1 person hand held assist Transfers: Sit to/from Stand Sit to Stand: Supervision              Balance Overall balance assessment: Needs assistance Sitting-balance support: Feet supported;No upper extremity supported Sitting balance-Leahy Scale: Good     Standing balance support: During functional activity;Bilateral upper extremity supported Standing balance-Leahy Scale: King                             ADL either performed or assessed with clinical judgement   ADL Overall ADL's : Needs assistance/impaired                                       General ADL Comments: Close CGA for functional mobility with consistent cueing for safety. Pt not used to wearing a [place]. Decreased safety awareness. Anticipate MIN A for LB dressing and bathing.     Vision Baseline Vision/History: Wears glasses (recent cataract sx.) Wears Glasses: At all times Patient Visual Report: Blurring of vision;Other (comment)       Perception     Praxis      Pertinent Vitals/Pain Pain Assessment: No/denies pain     Hand Dominance Right   Extremity/Trunk Assessment Upper Extremity Assessment Upper Extremity Assessment: Generalized weakness   Lower Extremity Assessment Lower Extremity Assessment: Generalized weakness       Communication Communication Communication: HOH   Cognition Arousal/Alertness: Awake/alert Behavior During Therapy: WFL for tasks assessed/performed;Restless Overall Cognitive Status: Within Functional Limits  for tasks assessed                                 General Comments: Pt A&O x4. Impulsive during session, requires consistent re-direction to task, particularly with mobility related activities. Pt with decreased awareness of deficits.   General Comments  Pt vitals monitored t/o session. HR remains 70-80's during session. SO2 with  pt on 2L noted to be 95-96% at rest. With functional mobility pt desats to 79% on 2L. She is increased to 3L and cued to PLB and rebounds to 89-90%. Once back to bed pt SO2 returns to 96%. Pt left on 2L. RN updated.    Exercises Other Exercises Other Exercises: Pt educated on role of OT in acute setting, safe use of AE for functional mobility, energy conservation including pursed lip breathing and taking frequent rest breaks, and falls prevention strategies.   Shoulder Instructions      Home Living                               Home Equipment: Other (comment);Walker - 4 wheels (3 prong cane)          Prior Functioning/Environment Level of Independence: Needs assistance  Gait / Transfers Assistance Needed: Per pt, she ambulates with a 3 prong cane at her facility. Does have a 4WW but does not use it. Endorses at least 3 falls in last 6 months. ADL's / Homemaking Assistance Needed: Pt reports she requries at least some assist with bathing, dressing, and grooming. Pt endorses decreased FMC at baseline. She is unable to button buttons or manage small items. Communication / Swallowing Assistance Needed: Pt wears hearing aids but batteries are dead.          OT Problem List: Decreased strength;Decreased coordination;Cardiopulmonary status limiting activity;Decreased activity tolerance;Decreased safety awareness;Impaired balance (sitting and/or standing);Decreased knowledge of use of DME or AE      OT Treatment/Interventions: Self-care/ADL training;Therapeutic exercise;Therapeutic activities;DME and/or AE instruction;Patient/family education;Balance training;Energy conservation    OT Goals(Current goals can be found in the care plan section) Acute Rehab OT Goals Patient Stated Goal: To go for a longer walk OT Goal Formulation: With patient Time For Goal Achievement: 09/10/20 Potential to Achieve Goals: Good  OT Frequency: Min 2X/week   Barriers to D/C: Decreased caregiver  support          Co-evaluation              AM-PAC OT "6 Clicks" Daily Activity     Outcome Measure Help from another person eating meals?: None Help from another person taking care of personal grooming?: A Little Help from another person toileting, which includes using toliet, bedpan, or urinal?: A Little Help from another person bathing (including washing, rinsing, drying)?: A Little Help from another person to put on and taking off regular upper body clothing?: A Little Help from another person to put on and taking off regular lower body clothing?: A Little 6 Click Score: 19   End of Session Equipment Utilized During Treatment: Gait belt;Rolling walker;Oxygen Nurse Communication: Mobility status  Activity Tolerance: Patient tolerated treatment well Patient left: in bed;with call bell/phone within reach;with bed alarm set  OT Visit Diagnosis: Other abnormalities of gait and mobility (R26.89);Muscle weakness (generalized) (M62.81);History of falling (Z91.81)                Time: 9767-3419  OT Time Calculation (min): 43 min Charges:  OT General Charges $OT Visit: 1 Visit OT Evaluation $OT Eval Moderate Complexity: 1 Mod OT Treatments $Self Care/Home Management : 23-37 mins  Rockney Ghee, M.S., OTR/L Ascom: [phone] 08/27/20, 3:39 PM

## 2020-08-27 NOTE — Consult Note (Signed)
   Heart Failure Nurse Navigator Note  HFpEF 55 to 60%.  Normal right ventricular systolic function.  Mild mitral regurgitation.  She presented with worsening shortness of breath.  Comorbidities:  Coronary artery disease Chronic kidney disease Hypertension Diabetes COPD  Continued tobacco abuse  Medications:  Norvasc 10 mg daily Furosemide 40 mg IV 2 times Metoprolol tartrate 25 mg 2 times a day  Labs:  Sodium 132, potassium 4.5, chloride 99, CO2 25, BUN 36, creatinine 1.79 up from previously reported at 1.47.  BNP 659.   Initial meeting with patient today.  She is very hard of hearing and does not have a hearing aid, also she states that she cannot read because she does not have her glasses.  She is waiting to have a new pair made.  Discussed heart failure, she states that she uses salt at the table.  Explained that she should not use that and gave her the reasoning behind that.  She states that they weigh her frequent late at the assisted living facility.  Recommend that she be weighed daily and if her weight increases her physicians to be notified.   Tresa Endo RN [facility]

## 2020-08-27 NOTE — Progress Notes (Signed)
*  PRELIMINARY RESULTS* Echocardiogram 2D Echocardiogram has been performed.  Melanie King 08/27/2020, 12:23 PM

## 2020-08-27 NOTE — Progress Notes (Signed)
Initial Nutrition Assessment  DOCUMENTATION CODES:  Non-severe (moderate) malnutrition in context of chronic illness  INTERVENTION:  Continue current diet as ordered, encourage PO intake Ensure Max po BID, each supplement provides 150 kcal and 30 grams of protein.  MVI with minerals daily  NUTRITION DIAGNOSIS:  Moderate Malnutrition related to chronic illness (CHF, COPD) as evidenced by severe muscle depletion, mild muscle depletion, moderate fat depletion.  GOAL:  Patient will meet greater than or equal to 90% of their needs  MONITOR:  PO intake, Supplement acceptance  REASON FOR ASSESSMENT:  Consult Assessment of nutrition requirement/status  ASSESSMENT:  75 y.o. female with medical history significant for CHF, COPD, CAD, CKD3, HTN, HLD, diabetes, presented to ED with worsening SOB on exertion x 1 month. Several day history of orthopnea that became acutely worse on the night of admission.   Pt resting in bed at the time of assessment. Very hard of hearing, communication was difficult. Pt reports that appetite has been good. Lives at a facility and states that she receives her meals there. Denies GI distress.  Pt anxious during visit, worried about not having some of her items (such as her cane) form her facility. Assured her equipment she may need would be available at hospital for her.    Significant muscle and fat deficits noted on exam, particularly to patient's extremities.   Average Meal Intake: 6/23-6/24: 90% intake x 1 recorded meal  Nutritionally Relevant Medications: Scheduled Meds:  furosemide  40 mg Intravenous BID   insulin aspart  0-15 Units Subcutaneous TID WC   insulin aspart  0-5 Units Subcutaneous QHS   methylPREDNISolone (SOLU-MEDROL) injection  40 mg Intravenous Q12H   PRN Meds: ondansetron   Labs Reviewed: SBG ranges from 264-361 mg/dL over the last 24 hours HgbA1c 7.8% (6/24)  NUTRITION - FOCUSED PHYSICAL EXAM: Flowsheet Row Most Recent Value   Orbital Region Moderate depletion  Upper Arm Region Severe depletion  Thoracic and Lumbar Region Moderate depletion  Buccal Region Moderate depletion  Temple Region Mild depletion  Clavicle Bone Region No depletion  Clavicle and Acromion Bone Region No depletion  Scapular Bone Region No depletion  Dorsal Hand Mild depletion  Patellar Region Severe depletion  Anterior Thigh Region Severe depletion  Posterior Calf Region Severe depletion  Edema (RD Assessment) Mild  Hair Reviewed  Eyes Reviewed  Mouth Reviewed  Skin Reviewed  Nails Reviewed   Diet Order:   Diet Order             Diet heart healthy/carb modified Room service appropriate? Yes; Fluid consistency: Thin  Diet effective now                  EDUCATION NEEDS:  No education needs have been identified at this time  Skin:  Skin Assessment: Reviewed RN Assessment  Last BM:  6/23  Height:  Ht Readings from Last 1 Encounters:  08/26/20 5\' 1"  (1.549 m)    Weight:  Wt Readings from Last 1 Encounters:  08/26/20 66.2 kg    Ideal Body Weight:  47.7 kg  BMI:  Body mass index is 27.59 kg/m.  Estimated Nutritional Needs:  Kcal:  1400-1600 kcal/d Protein:  75-90g/d Fluid:  1.5-1.8L/d  08/28/20, RD, LDN Clinical Dietitian Pager on [employer]

## 2020-08-27 NOTE — Evaluation (Addendum)
Physical Therapy Evaluation Patient Details Name: Melanie King MRN: [id number] DOB: 05-Dec-1945 Today's Date: 08/27/2020   History of Present Illness  Pt is a 75 y/o F admitted on 08/26/20 with c/c of SOB x 1 month. Pt is being treated for acute on chronic diastolic CHF & COPD with mild acute exacerbation. PMH: diastolic heart failure, CAD, pacemaker, CKD3, HTN, DM, arthritis, COPD, cervical & lumbar DDD, depression, GERD, HLD, OA, tremor  Clinical Impression  Pt seen for PT evaluation with pt extremely HOH & hearing aide batteries dead. Communication was made easier by writing down simple sentences. Pt requires supervision for bed mobility with hospital bed features, CGA for transfers, and supervision<>CGA with gait with RW. Pt eager to ambulate longer distances but is instructed to take multiple standing rest breaks 2/2 O2 levels. Pt on 2L/min at beginning & end of session but increased to 3L/min with longer walk. Inconsistent SpO2 readings during session, with lowest 78% but then quickly within the 90s & pt without c/o SOB/difficulty breathing - nurse made aware. PT educated pt in importance of maintaining O2 sats but demonstrated impaired retention of information. Will continue to follow pt acutely to address gait with LRAD & endurance.     Follow Up Recommendations Home health PT;Supervision/Assistance - 24 hour    Equipment Recommendations  Rolling walker with 5" wheels    Recommendations for Other Services       Precautions / Restrictions Precautions Precautions: Fall Restrictions Weight Bearing Restrictions: No      Mobility  Bed Mobility Overal bed mobility: Needs Assistance Bed Mobility: Supine to Sit     Supine to sit: Supervision;HOB elevated Sit to supine: Supervision   General bed mobility comments: extra time, pt with 1 LOB posteriorly/R onto bed 2/2 impaired core strength/control but able to catch herself, doesn't require assistance to return to upright sitting     Transfers Overall transfer level: Needs assistance Equipment used: Rolling walker (2 wheeled) Transfers: Sit to/from Stand Sit to Stand: Min guard         General transfer comment: poor awareness of safe hand placement as pt places BUE on RW vs pushing to stand with 1UE on stable surface  Ambulation/Gait Ambulation/Gait assistance: Supervision;Min guard Gait Distance (Feet): 30 Feet (+ 100 ft) Assistive device: Rolling walker (2 wheeled) Gait Pattern/deviations: Decreased step length - right;Decreased step length - left;Decreased stride length Gait velocity: decreased   General Gait Details: pt bumps into door frame, wall with RW, completes wide turns, requires several standing rest breaks (PT instructs her to do so) 2/2 O2 levels  Stairs            Wheelchair Mobility    Modified Rankin (Stroke Patients Only)       Balance Overall balance assessment: Needs assistance Sitting-balance support: Bilateral upper extremity supported;Feet supported Sitting balance-Leahy Scale: Fair     Standing balance support: During functional activity;Bilateral upper extremity supported Standing balance-Leahy Scale: Fair Standing balance comment: able to stand without LOB without UE support for static standing balance.                             Pertinent Vitals/Pain Pain Assessment: Faces Faces Pain Scale: No hurt    Home Living Family/patient expects to be discharged to:: Assisted living               Home Equipment: Other (comment);Walker - 4 wheels (3 prong cane) Additional Comments: Pt told OT  she has a 3 pronged cane, but reported to PT she has  SPC    Prior Function Level of Independence: Needs assistance   Gait / Transfers Assistance Needed: Per pt she ambulates with SPC at her facility. Reports she needs assistance for bed mobility due to losing her balance back to supine sometimes.  ADL's / Homemaking Assistance Needed: Pt reports she needs help  with bathing & dressing.  Comments: Pt endorses 2 falls in the past 6 months. Pt also endorses confusion, "I'll just be walking & forget what I'm doing".     Hand Dominance   Dominant Hand: Right    Extremity/Trunk Assessment   Upper Extremity Assessment Upper Extremity Assessment: Generalized weakness    Lower Extremity Assessment Lower Extremity Assessment: Generalized weakness       Communication   Communication: HOH  Cognition Arousal/Alertness: Awake/alert Behavior During Therapy: WFL for tasks assessed/performed Overall Cognitive Status: Within Functional Limits for tasks assessed                                 General Comments: Pleasant lady, eager to participate, get out of room. Appreciative of PT coming by & getting to interact with someone. Requires max cuing/education re: O2 levels for safe mobility.      General Comments General comments (skin integrity, edema, etc.): Pt with purewick & mesh underwear donned, but urine on linens. PT provides assistance with changing gown & doffing underwear.    Exercises Other Exercises Other Exercises: Pt educated on role of OT in acute setting, safe use of AE for functional mobility, energy conservation including pursed lip breathing and taking frequent rest breaks, and falls prevention strategies.   Assessment/Plan    PT Assessment Patient needs continued PT services  PT Problem List Decreased strength;Decreased mobility;Decreased safety awareness;Decreased activity tolerance;Decreased cognition;Cardiopulmonary status limiting activity;Decreased balance       PT Treatment Interventions DME instruction;Therapeutic exercise;Wheelchair mobility training;Gait training;Balance training;Stair training;Neuromuscular re-education;Functional mobility training;Cognitive remediation;Therapeutic activities;Patient/family education    PT Goals (Current goals can be found in the Care Plan section)  Acute Rehab PT  Goals Patient Stated Goal: To go for a longer walk PT Goal Formulation: With patient Time For Goal Achievement: 09/10/20 Potential to Achieve Goals: Good    Frequency Min 2X/week   Barriers to discharge        Co-evaluation               AM-PAC PT "6 Clicks" Mobility  Outcome Measure Help needed turning from your back to your side while in a flat bed without using bedrails?: A Little Help needed moving from lying on your back to sitting on the side of a flat bed without using bedrails?: A Little Help needed moving to and from a bed to a chair (including a wheelchair)?: A Little Help needed standing up from a chair using your arms (e.g., wheelchair or bedside chair)?: A Little Help needed to walk in hospital room?: A Little Help needed climbing 3-5 steps with a railing? : A Lot 6 Click Score: 17    End of Session Equipment Utilized During Treatment: Oxygen Activity Tolerance: Patient tolerated treatment well Patient left: in chair;with call bell/phone within reach;with chair alarm set;with nursing/sitter in room Nurse Communication: Mobility status (O2) PT Visit Diagnosis: Unsteadiness on feet (R26.81);Difficulty in walking, not elsewhere classified (R26.2);Muscle weakness (generalized) (M62.81);History of falling (Z91.81)    Time: 7989-2119 PT Time Calculation (min) (ACUTE  ONLY): 32 min   Charges:   PT Evaluation $PT Eval Moderate Complexity: 1 Mod PT Treatments $Therapeutic Activity: 23-37 mins        Aleda Grana, PT, DPT 08/27/20, 4:02 PM   Sandi Mariscal 08/27/2020, 4:02 PM

## 2020-08-27 NOTE — Consult Note (Addendum)
[facility] Cardiology  CARDIOLOGY CONSULT NOTE  Patient ID: Melanie King MRN: [id number] DOB/AGE: 1945/04/08 75 y.o.  Admit date: 08/26/2020 Referring Physician Lucianne Muss Primary Physician [place] Primary Cardiologist [facility] Reason for Consultation congestive heart failure  HPI: 75 year old female referred for evaluation of congestive heart failure.  Patient has a history of chronic diastolic congestive heart failure, COPD with ongoing tobacco abuse, and chronic kidney disease.  She presents with 3-week history of progressive onset of breath with exertion as well as shortness of breath at rest associated wheezing without cough, fever or chills.  She also reports mild pedal edema.  She denies chest pain.  Patient presented to [facility] ED.  ECG nondiagnostic revealing sinus rhythm at 73 bpm with nonspecific ST abnormalities.  Chest x-ray revealed moderate pulmonary edema.  High-sensitivity troponin was borderline elevated at 168, 139, 140.  BNP was moderately elevated to 609.2.  Patient treated with intravenous furosemide with diuresis and clinical improvement.  Patient with notable wheezing on physical examination.  Prior 2D echocardiogram 07/15/2019 revealed normal left ventricular function, with LVEF 60 to 65%.  Review of systems complete and found to be negative unless listed above     Past Medical History:  Diagnosis Date   Arthritis    CHF (congestive heart failure) (HCC)    COPD (chronic obstructive pulmonary disease) (HCC)    Coronary arteriosclerosis    DDD (degenerative disc disease), cervical    DDD (degenerative disc disease), lumbar    Depression    Diabetes mellitus without complication (HCC)    GERD (gastroesophageal reflux disease)    Hyperlipidemia    Hypertension    Osteoarthritis    Pacemaker    Tremor     Past Surgical History:  Procedure Laterality Date   ABDOMINAL HYSTERECTOMY     BREAST BIOPSY Right 1994   neg cyst removed   CATARACT EXTRACTION W/PHACO Right 08/17/2020    Procedure: CATARACT EXTRACTION PHACO AND INTRAOCULAR LENS PLACEMENT (IOC) RIGHT DIABETIC 8.90 00:52.9;  Surgeon: Galen Manila, MD;  Location: [facility];  Service: Ophthalmology;  Laterality: Right;  Diabetic   CHOLECYSTECTOMY     EYE SURGERY  1964, 1966, and 1967   bilateral   FOOT SURGERY Right    cellulitis   PACEMAKER INSERTION     PPM GENERATOR CHANGEOUT N/A 02/24/2020   Procedure: PPM GENERATOR CHANGEOUT;  Surgeon: Marcina Millard, MD;  Location: [facility] INVASIVE CV LAB;  Service: Cardiovascular;  Laterality: N/A;   SPINE SURGERY     cyst removed; [facility]    (Not in a hospital admission)  Social History   Socioeconomic History   Marital status: Single    Spouse name: Not on file   Number of children: Not on file   Years of education: Not on file   Highest education level: Not on file  Occupational History   Not on file  Tobacco Use   Smoking status: Every Day    Packs/day: 1.00    Years: 50.00    Pack years: 50.00    Types: Cigarettes   Smokeless tobacco: Never  Vaping Use   Vaping Use: Every day  Substance and Sexual Activity   Alcohol use: No   Drug use: No   Sexual activity: Never  Other Topics Concern   Not on file  Social History Narrative   Not on file   Social Determinants of Health   Financial Resource Strain: Not on file  Food Insecurity: Not on file  Transportation Needs: Not on file  Physical Activity: Not on file  Stress: Not on file  Social Connections: Not on file  Intimate Partner Violence: Not on file    Family History  Problem Relation Age of Onset   Breast cancer Maternal Aunt 17   Cancer Mother        colon   Aneurysm Father       Review of systems complete and found to be negative unless listed above      PHYSICAL EXAM  General: Well developed, well nourished, in no acute distress HEENT:  Normocephalic and atramatic Neck:  No JVD.  Lungs: Clear bilaterally to auscultation and percussion. Heart:  HRRR . Normal S1 and S2 without gallops or murmurs.  Abdomen: Bowel sounds are positive, abdomen soft and non-tender  Msk:  Back normal, normal gait. Normal strength and tone for age. Extremities: No clubbing, cyanosis or edema.   Neuro: Alert and oriented X 3. Psych:  Good affect, responds appropriately  Labs:   Lab Results  Component Value Date   WBC 13.1 (H) 08/26/2020   HGB 9.2 (L) 08/26/2020   HCT 27.9 (L) 08/26/2020   MCV 94.6 08/26/2020   PLT 343 08/26/2020    Recent Labs  Lab 08/26/20 2239  NA 132*  K 4.5  CL 99  CO2 25  BUN 36*  CREATININE 1.79*  CALCIUM 9.0  PROT 7.6  BILITOT 0.7  ALKPHOS 76  ALT <5  AST 14*  GLUCOSE 225*   Lab Results  Component Value Date   CKTOTAL 689 (H) 03/21/2019   TROPONINI <0.03 03/29/2017   No results found for: CHOL No results found for: HDL No results found for: LDLCALC No results found for: TRIG No results found for: CHOLHDL No results found for: LDLDIRECT    Radiology: DG Chest 2 View  Result Date: 08/05/2020 CLINICAL DATA:  Pain following fall EXAM: CHEST - 2 VIEW COMPARISON:  December 30, 2019 FINDINGS: There is persistent apparent interstitial edema. No consolidation. Heart is enlarged with pulmonary venous hypertension. Pacemaker leads are attached to the right atrium and right ventricle. No adenopathy. Postoperative change noted in right shoulder region. Evidence of prior trauma involving the lateral right clavicle. No pneumothorax. IMPRESSION: Cardiomegaly with pulmonary vascular congestion. Interstitial edema. Suspect a degree of chronic congestive heart failure. No consolidation. Pacemaker leads attached to right atrium and right ventricle. Electronically Signed   By: Bretta Bang III M.D.   On: 08/05/2020 07:56   DG Lumbar Spine 2-3 Views  Result Date: 08/05/2020 CLINICAL DATA:  Following fall EXAM: LUMBAR SPINE - 2-3 VIEW COMPARISON:  April 02, 2017. FINDINGS: Frontal, lateral, and spot lumbosacral lateral images  were obtained. There are 5 non-rib-bearing lumbar type vertebral bodies. There is no fracture or spondylolisthesis. Moderately severe disc space narrowing T12-L1 is stable. Lumbar discs appear unremarkable. There is facet osteoarthritic change at L4-5 and L5-S1 bilaterally. There is aortic and iliac artery atherosclerosis. IMPRESSION: Osteoarthritic change, most notably at T12-L1. No fracture or spondylolisthesis. Aortic Atherosclerosis (ICD10-I70.0). Electronically Signed   By: Bretta Bang III M.D.   On: 08/05/2020 07:58   CT Head Wo Contrast  Result Date: 08/05/2020 CLINICAL DATA:  Larey Seat at assisted living facility, this was standing, became dizzy and fell backwards striking back of head. Denies loss of consciousness. History CHF, COPD, coronary artery disease, diabetes mellitus, hypertension EXAM: CT HEAD WITHOUT CONTRAST CT CERVICAL SPINE WITHOUT CONTRAST TECHNIQUE: Multidetector CT imaging of the head and cervical spine was performed following the standard protocol without intravenous contrast.  Multiplanar CT image reconstructions of the cervical spine were also generated. COMPARISON:  05/02/2020 FINDINGS: CT HEAD FINDINGS Brain: Generalized atrophy. Ex vacuo dilatation of the ventricular system. No midline shift or mass effect. Small vessel chronic ischemic changes of deep cerebral white matter. Small old basal ganglia lacunar infarcts. No intracranial hemorrhage, mass lesion or evidence of acute infarction. No extra-axial fluid collections. Vascular: No hyperdense vessels. Atherosclerotic calcification of internal carotid and vertebral arteries at skull base. Skull: Intact Sinuses/Orbits: Clear Other: N/A CT CERVICAL SPINE FINDINGS Alignment: Minimal anterolisthesis C4-C5 unchanged. Remaining alignments normal. Skull base and vertebrae: Osseous demineralization. Incomplete posterior arch C1, developmental anomaly. Vertebral body heights maintained. Scattered disc space narrowing and minimal endplate  spur formation. Multilevel facet degenerative changes. No definite fracture, additional subluxation, or bone destruction. Lateral cervical flexion to LEFT. Soft tissues and spinal canal: Prevertebral soft tissues normal thickness Disc levels: Significant atherosclerotic calcifications at carotid bifurcations with additional calcifications in proximal great vessels. Upper chest: Interseptal thickening at apices. Small pleural effusion. Other: N/A IMPRESSION: Atrophy with small vessel chronic ischemic changes of deep cerebral white matter. Old basal ganglia lacunar infarcts. No acute intracranial abnormalities. Multilevel degenerative disc and facet disease changes of the cervical spine. No acute cervical spine abnormalities. Significant atherosclerotic calcification at BILATERAL carotid bifurcations; recommend correlation for risk factors for cerebrovascular disease and consider carotid duplex sonography assessment. Electronically Signed   By: Ulyses Southward M.D.   On: 08/05/2020 08:25   CT Cervical Spine Wo Contrast  Result Date: 08/05/2020 CLINICAL DATA:  Larey Seat at assisted living facility, this was standing, became dizzy and fell backwards striking back of head. Denies loss of consciousness. History CHF, COPD, coronary artery disease, diabetes mellitus, hypertension EXAM: CT HEAD WITHOUT CONTRAST CT CERVICAL SPINE WITHOUT CONTRAST TECHNIQUE: Multidetector CT imaging of the head and cervical spine was performed following the standard protocol without intravenous contrast. Multiplanar CT image reconstructions of the cervical spine were also generated. COMPARISON:  05/02/2020 FINDINGS: CT HEAD FINDINGS Brain: Generalized atrophy. Ex vacuo dilatation of the ventricular system. No midline shift or mass effect. Small vessel chronic ischemic changes of deep cerebral white matter. Small old basal ganglia lacunar infarcts. No intracranial hemorrhage, mass lesion or evidence of acute infarction. No extra-axial fluid  collections. Vascular: No hyperdense vessels. Atherosclerotic calcification of internal carotid and vertebral arteries at skull base. Skull: Intact Sinuses/Orbits: Clear Other: N/A CT CERVICAL SPINE FINDINGS Alignment: Minimal anterolisthesis C4-C5 unchanged. Remaining alignments normal. Skull base and vertebrae: Osseous demineralization. Incomplete posterior arch C1, developmental anomaly. Vertebral body heights maintained. Scattered disc space narrowing and minimal endplate spur formation. Multilevel facet degenerative changes. No definite fracture, additional subluxation, or bone destruction. Lateral cervical flexion to LEFT. Soft tissues and spinal canal: Prevertebral soft tissues normal thickness Disc levels: Significant atherosclerotic calcifications at carotid bifurcations with additional calcifications in proximal great vessels. Upper chest: Interseptal thickening at apices. Small pleural effusion. Other: N/A IMPRESSION: Atrophy with small vessel chronic ischemic changes of deep cerebral white matter. Old basal ganglia lacunar infarcts. No acute intracranial abnormalities. Multilevel degenerative disc and facet disease changes of the cervical spine. No acute cervical spine abnormalities. Significant atherosclerotic calcification at BILATERAL carotid bifurcations; recommend correlation for risk factors for cerebrovascular disease and consider carotid duplex sonography assessment. Electronically Signed   By: Ulyses Southward M.D.   On: 08/05/2020 08:25   DG Chest Port 1 View  Result Date: 08/26/2020 CLINICAL DATA:  Dyspnea EXAM: PORTABLE CHEST 1 VIEW COMPARISON:  08/05/2020 FINDINGS: The lungs are symmetrically well  expanded. No pneumothorax or pleural effusion. The pulmonary vascularity is diffusely increased with moderate diffuse interstitial pulmonary edema present in keeping with changes of moderate cardiogenic failure. Cardiac size within normal limits. Left subclavian dual lead pacemaker is unchanged. No  acute bone abnormality. IMPRESSION: Moderate cardiogenic failure. Electronically Signed   By: Helyn Numbers MD   On: 08/26/2020 21:42    EKG: Sinus rhythm at 73 bpm with nonspecific ST abnormalities  ASSESSMENT AND PLAN:   1.  Respiratory failure, likely multifactorial, secondary to acute on chronic diastolic congestive heart failure, COPD exacerbation, complicated by underlying chronic kidney disease stage IIIb 2.  Acute on chronic diastolic congestive heart failure, normal left ventricular function by 2D echocardiogram 07/15/2019, clinically improved after initial diuresis with intravenous furosemide 3.  COPD exacerbation, with ongoing tobacco abuse 4.  Chronic kidney disease stage IIIb, BUN and creatinine 36 and 1.79, GFR 29 5.  Elevated troponin, without significant delta, in the absence of chest pain, without acute ischemic ST-T wave changes, likely demand supply ischemia due to respiratory failure 6.  Sick sinus syndrome, status post dual-chamber pacemaker 7.  Essential hypertension, blood pressure elevated  Recommendations  1.  Agree with overall current therapy 2.  Continue diuresis 3.  Carefully monitor renal status 4.  Continue metoprolol tartrate 25 mg twice daily 5.  Resume amlodipine 10 mg daily 6.  Defer full dose anticoagulation 7.  Defer cardiac catheterization in the absence of chest pain 8.  Review 2D echocardiogram    Signed: Marcina Millard MD,PhD, [facility] 08/27/2020, 8:13 AM

## 2020-08-27 NOTE — Progress Notes (Signed)
[facility] Progress Note  Patient: Melanie King    ZOX:[id number]  DOA: 08/26/2020     Date of Service: the patient was seen and examined on 08/27/2020  Chief Complaint  Patient presents with   Shortness of Breath   Brief hospital course: Melanie King is a 75 y.o. female with medical history significant for Diastolic heart failure, CAD, pacemaker, CKD 3, HTN, diabetes, who presents to the emergency room with a 1 month history of shortness of breath with exertion and moving from room to room and a several day history of orthopnea that became acutely worse on the night of admission.  She denies chest pain though she states it feels like a fist in the middle of her chest.  Denies palpitations, nausea vomiting or diaphoresis.  Denies cough, fever or chills.  Has no lower extremity pain or swelling.  She has no abdominal pain or diarrhea. ED course: On arrival, afebrile, BP 173/58 with pulse of 76 respirations 22 with O2 sat in the low 90s on room air.  Blood work significant for first troponin of 168 and BNP 659.  Creatinine 1.79, up from baseline of 1.47.  WBC 13,000 with hemoglobin 9.2 down from a baseline of 11.5 three weeks prior. EKG, personally viewed and interpreted NSR at 75 with RBBB and LAFB and no acute ST-T wave changes Imaging: CXR with moderate cardiogenic failure   Patient treated with IV Lasix and also with duo nebs.  Hospitalist consulted for admission  Assessment and Plan:  Acute on chronic diastolic CHF -Patient with shortness of breath, orthopnea, BNP 659 and chest x-ray with moderate cardiogenic failure -IV Lasix 40 every 12 hours - Continue home beta-blocker and ACE/ARB - Daily weights, intake and output monitoring - Follow 2D echocardiogram - Cardiology consult, no need of ischemic work-up   COPD with acute exacerbation  - Solu-Medrol 125 mg IV 1 dose, followed by IV Solu-Medrol 40 every 12 hourly, gradually transition to oral prednisone after  improvement Started Breo Ellipta inhaler Continue DuoNeb every 6 hourly scheduled and transition to as needed after improvement No antibiotics ordered at this time     Elevated troponin   CAD (coronary artery disease) - Troponin [phone], EKG nonacute - Likely demand ischemia - Continue home antiplatelet and statin and beta-blocker     Acute kidney injury superimposed on CKD lllb (HCC) - Creatinine 1.79, up from baseline 1.47 - Continue to monitor     Hypertension - Chronic and stable     Type 2 diabetes mellitus without complication, - Sliding scale insulin coverage Monitor FSBG, patient remains at high risk for hypoglycemia secondary to steroids   Cardiac pacemaker -Pacemaker interrogation    Body mass index is 27.59 kg/m.  Interventions:       Diet: Heart healthy and carb modified DVT Prophylaxis: Subcutaneous Lovenox   Advance goals of care discussion: Full code  Family Communication: family was not present at bedside, at the time of interview.  The pt provided permission to discuss medical plan with the family. Opportunity was given to ask question and all questions were answered satisfactorily.   Disposition:  Pt is from Home, admitted with resp failure CHF/COPD, still has resp failure, which precludes a safe discharge. Discharge to Home, when clinically stable, most likely in 2 to 3 days.  Subjective: No acute overnight issues, patient was admitted with shortness of breath, patient was found to have significant wheezing at bedside so started treatment for COPD exacerbation with steroids.  Patient  denied any chest pain, no palpitations, denied any abdominal pain, no nausea vomiting or diarrhea.  Physical Exam: General:  alert oriented to time, place, and person.  Appear in mild distress, affect appropriate Eyes: PERRLA ENT: Oral Mucosa Clear, moist  Neck: no JVD,  Cardiovascular: S1 and S2 Present, no Murmur,  Respiratory: increased respiratory effort,  Bilateral Air entry equal and Decreased, mild Crackles, b/l wheezes Abdomen: Bowel Sound present, Soft and no tenderness,  Skin: no rashes Extremities: no Pedal edema, no calf tenderness Neurologic: without any new focal findings Gait not checked due to patient safety concerns  Vitals:   08/27/20 0800 08/27/20 0830 08/27/20 0939 08/27/20 1131  BP: (!) 163/76 (!) 165/91 (!) 164/62 (!) 167/70  Pulse: 70 69 70 71  Resp: (!) 26 19 18 16   Temp:   97.6 F (36.4 C) 97.8 F (36.6 C)  TempSrc:   Oral   SpO2: 98% 99% 100% 96%  Weight:      Height:       No intake or output data in the 24 hours ending 08/27/20 1300 Filed Weights   08/26/20 2019  Weight: 66.2 kg    Data Reviewed: I have personally reviewed and interpreted daily labs, tele strips, imagings as discussed above. I reviewed all nursing notes, pharmacy notes, vitals, pertinent old records I have discussed plan of care as described above with RN and patient/family.  CBC: Recent Labs  Lab 08/26/20 2239  WBC 13.1*  NEUTROABS 10.5*  HGB 9.2*  HCT 27.9*  MCV 94.6  PLT 343   Basic Metabolic Panel: Recent Labs  Lab 08/26/20 2239  NA 132*  K 4.5  CL 99  CO2 25  GLUCOSE 225*  BUN 36*  CREATININE 1.79*  CALCIUM 9.0    Studies: DG Chest Port 1 View  Result Date: 08/26/2020 CLINICAL DATA:  Dyspnea EXAM: PORTABLE CHEST 1 VIEW COMPARISON:  08/05/2020 FINDINGS: The lungs are symmetrically well expanded. No pneumothorax or pleural effusion. The pulmonary vascularity is diffusely increased with moderate diffuse interstitial pulmonary edema present in keeping with changes of moderate cardiogenic failure. Cardiac size within normal limits. Left subclavian dual lead pacemaker is unchanged. No acute bone abnormality. IMPRESSION: Moderate cardiogenic failure. Electronically Signed   By: 10/05/2020 MD   On: 08/26/2020 21:42    Scheduled Meds:  amLODipine  10 mg Oral Daily   enoxaparin (LOVENOX) injection  30 mg Subcutaneous  Q24H   fluticasone furoate-vilanterol  1 puff Inhalation Daily   furosemide  40 mg Intravenous BID   insulin aspart  0-15 Units Subcutaneous TID WC   insulin aspart  0-5 Units Subcutaneous QHS   ipratropium-albuterol  3 mL Nebulization Q6H   methylPREDNISolone (SOLU-MEDROL) injection  40 mg Intravenous Q12H   metoprolol tartrate  25 mg Oral BID   nitroGLYCERIN  0.5 inch Topical Q6H   sodium chloride flush  3 mL Intravenous Q12H   Continuous Infusions:  sodium chloride     PRN Meds: sodium chloride, acetaminophen, albuterol, methocarbamol, ondansetron (ZOFRAN) IV, sodium chloride flush  Time spent: 35 minutes  Author: 08/28/2020. MD [facility] 08/27/2020 1:00 PM  To reach On-call, see care teams to locate the attending and reach out to them via [email or web address]. If 7PM-7AM, please contact night-coverage If you still have difficulty reaching the attending provider, please page the [facility] (Director on Call) for [facility] on amion for assistance.

## 2020-08-28 DIAGNOSIS — E440 Moderate protein-calorie malnutrition: Secondary | ICD-10-CM | POA: Insufficient documentation

## 2020-08-28 LAB — CBC
HCT: 27.4 % — ABNORMAL LOW (ref 36.0–46.0)
Hemoglobin: 9.4 g/dL — ABNORMAL LOW (ref 12.0–15.0)
MCH: 31.9 pg (ref 26.0–34.0)
MCHC: 34.3 g/dL (ref 30.0–36.0)
MCV: 92.9 fL (ref 80.0–100.0)
Platelets: 355 10*3/uL (ref 150–400)
RBC: 2.95 MIL/uL — ABNORMAL LOW (ref 3.87–5.11)
RDW: 17.8 % — ABNORMAL HIGH (ref 11.5–15.5)
WBC: 13.2 10*3/uL — ABNORMAL HIGH (ref 4.0–10.5)
nRBC: 0 % (ref 0.0–0.2)

## 2020-08-28 LAB — BASIC METABOLIC PANEL
Anion gap: 9 (ref 5–15)
BUN: 46 mg/dL — ABNORMAL HIGH (ref 8–23)
CO2: 27 mmol/L (ref 22–32)
Calcium: 9.5 mg/dL (ref 8.9–10.3)
Chloride: 94 mmol/L — ABNORMAL LOW (ref 98–111)
Creatinine, Ser: 1.51 mg/dL — ABNORMAL HIGH (ref 0.44–1.00)
GFR, Estimated: 36 mL/min — ABNORMAL LOW (ref >60)
Glucose, Bld: 270 mg/dL — ABNORMAL HIGH (ref 70–99)
Potassium: 4.2 mmol/L (ref 3.5–5.1)
Sodium: 130 mmol/L — ABNORMAL LOW (ref 135–145)

## 2020-08-28 LAB — PHOSPHORUS: Phosphorus: 4 mg/dL (ref 2.5–4.6)

## 2020-08-28 LAB — MAGNESIUM: Magnesium: 2.3 mg/dL (ref 1.7–2.4)

## 2020-08-28 LAB — GLUCOSE, CAPILLARY
Glucose-Capillary: 339 mg/dL — ABNORMAL HIGH (ref 70–99)
Glucose-Capillary: 305 mg/dL — ABNORMAL HIGH (ref 70–99)
Glucose-Capillary: 333 mg/dL — ABNORMAL HIGH (ref 70–99)
Glucose-Capillary: 364 mg/dL — ABNORMAL HIGH (ref 70–99)

## 2020-08-28 LAB — OSMOLALITY: Osmolality: 298 mOsm/kg — ABNORMAL HIGH (ref 275–295)

## 2020-08-28 MED ORDER — VITAMIN B-12 1000 MCG PO TABS
2000.0000 ug | ORAL_TABLET | Freq: Every day | ORAL | Status: DC
  Administered 2020-08-29 – 2020-08-30 (×2): 2000 ug via ORAL
  Filled 2020-08-28 (×2): qty 2

## 2020-08-28 MED ORDER — CHOLECALCIFEROL 10 MCG (400 UNIT) PO TABS: 400.0000 [IU] | ORAL_TABLET | Freq: Every day | ORAL | Status: DC

## 2020-08-28 MED ORDER — PRIMIDONE 50 MG PO TABS
50.0000 mg | ORAL_TABLET | Freq: Two times a day (BID) | ORAL | Status: DC
  Administered 2020-08-28 – 2020-08-30 (×4): 50 mg via ORAL
  Filled 2020-08-28 (×6): qty 1

## 2020-08-28 MED ORDER — GABAPENTIN 100 MG PO CAPS
100.0000 mg | ORAL_CAPSULE | Freq: Every day | ORAL | Status: DC
  Administered 2020-08-28: 100 mg via ORAL
  Filled 2020-08-28 (×2): qty 1

## 2020-08-28 MED ORDER — PANTOPRAZOLE SODIUM 40 MG PO TBEC
40.0000 mg | DELAYED_RELEASE_TABLET | Freq: Every day | ORAL | Status: DC
  Administered 2020-08-28 – 2020-08-30 (×3): 40 mg via ORAL
  Filled 2020-08-28 (×3): qty 1

## 2020-08-28 MED ORDER — METOPROLOL TARTRATE 50 MG PO TABS
50.0000 mg | ORAL_TABLET | Freq: Two times a day (BID) | ORAL | Status: DC
  Administered 2020-08-28 – 2020-08-30 (×3): 50 mg via ORAL
  Filled 2020-08-28 (×4): qty 1

## 2020-08-28 MED ORDER — CARBIDOPA-LEVODOPA 25-100 MG PO TABS
1.0000 | ORAL_TABLET | Freq: Three times a day (TID) | ORAL | Status: DC
  Administered 2020-08-28 – 2020-08-30 (×4): 1 via ORAL
  Filled 2020-08-28 (×8): qty 1

## 2020-08-28 MED ORDER — SENNOSIDES-DOCUSATE SODIUM 8.6-50 MG PO TABS
1.0000 | ORAL_TABLET | Freq: Two times a day (BID) | ORAL | Status: DC
  Administered 2020-08-28 – 2020-08-30 (×4): 1 via ORAL
  Filled 2020-08-28 (×5): qty 1

## 2020-08-28 MED ORDER — HYDROXYZINE HCL 25 MG PO TABS
25.0000 mg | ORAL_TABLET | Freq: Three times a day (TID) | ORAL | Status: DC | PRN
  Administered 2020-08-29: 25 mg via ORAL
  Filled 2020-08-28: qty 1

## 2020-08-28 MED ORDER — BENZTROPINE MESYLATE 0.5 MG PO TABS
0.5000 mg | ORAL_TABLET | Freq: Two times a day (BID) | ORAL | Status: DC
  Administered 2020-08-28 – 2020-08-30 (×4): 0.5 mg via ORAL
  Filled 2020-08-28 (×6): qty 1

## 2020-08-28 MED ORDER — HYDRALAZINE HCL 20 MG/ML IJ SOLN: 10.0000 mg | Freq: Four times a day (QID) | INTRAMUSCULAR | Status: DC | PRN

## 2020-08-28 MED ORDER — PREDNISOLONE ACETATE 1 % OP SUSP
1.0000 [drp] | Freq: Two times a day (BID) | OPHTHALMIC | Status: DC
  Administered 2020-08-28 – 2020-08-30 (×4): 1 [drp] via OPHTHALMIC
  Filled 2020-08-28: qty 1

## 2020-08-28 MED ORDER — BREXPIPRAZOLE 1 MG PO TABS
1.0000 mg | ORAL_TABLET | Freq: Every day | ORAL | Status: DC
  Administered 2020-08-28: 1 mg via ORAL
  Filled 2020-08-28 (×3): qty 1

## 2020-08-28 MED ORDER — CITALOPRAM HYDROBROMIDE 20 MG PO TABS
20.0000 mg | ORAL_TABLET | Freq: Every day | ORAL | Status: DC
  Administered 2020-08-28 – 2020-08-30 (×3): 20 mg via ORAL
  Filled 2020-08-28 (×3): qty 1

## 2020-08-28 MED ORDER — LOSARTAN POTASSIUM 50 MG PO TABS
50.0000 mg | ORAL_TABLET | Freq: Every day | ORAL | Status: DC
  Administered 2020-08-28 – 2020-08-30 (×3): 50 mg via ORAL
  Filled 2020-08-28 (×3): qty 1

## 2020-08-28 MED ORDER — MAGNESIUM OXIDE 400 MG PO TABS
400.0000 mg | ORAL_TABLET | Freq: Every day | ORAL | Status: DC
  Administered 2020-08-28 – 2020-08-30 (×3): 400 mg via ORAL
  Filled 2020-08-28 (×6): qty 1

## 2020-08-28 MED ORDER — CALCITRIOL 0.25 MCG PO CAPS: 0.2500 ug | ORAL_CAPSULE | Freq: Every day | ORAL | Status: DC

## 2020-08-28 MED ORDER — VITAMIN D (ERGOCALCIFEROL) 1.25 MG (50000 UNIT) PO CAPS
50000.0000 [IU] | ORAL_CAPSULE | ORAL | Status: DC
  Administered 2020-08-28: 50000 [IU] via ORAL
  Filled 2020-08-28: qty 1

## 2020-08-28 MED ORDER — FLUTICASONE PROPIONATE 50 MCG/ACT NA SUSP
2.0000 | Freq: Every day | NASAL | Status: DC
  Administered 2020-08-28 – 2020-08-30 (×3): 2 via NASAL
  Filled 2020-08-28: qty 16

## 2020-08-28 MED ORDER — LEVOTHYROXINE SODIUM 50 MCG PO TABS
150.0000 ug | ORAL_TABLET | Freq: Every day | ORAL | Status: DC
  Administered 2020-08-29 – 2020-08-30 (×2): 150 ug via ORAL
  Filled 2020-08-28 (×2): qty 1

## 2020-08-28 MED ORDER — LOSARTAN POTASSIUM 50 MG PO TABS: 50.0000 mg | ORAL_TABLET | Freq: Every day | ORAL | Status: DC

## 2020-08-28 MED ORDER — ASPIRIN 81 MG PO CHEW
81.0000 mg | CHEWABLE_TABLET | Freq: Every day | ORAL | Status: DC
  Administered 2020-08-28 – 2020-08-30 (×3): 81 mg via ORAL
  Filled 2020-08-28 (×3): qty 1

## 2020-08-28 MED ORDER — LORAZEPAM 0.5 MG PO TABS: 0.5000 mg | ORAL_TABLET | ORAL | Status: DC | PRN

## 2020-08-28 MED ORDER — TRAZODONE HCL 50 MG PO TABS
50.0000 mg | ORAL_TABLET | Freq: Every evening | ORAL | Status: DC | PRN
  Administered 2020-08-28: 50 mg via ORAL
  Filled 2020-08-28: qty 1

## 2020-08-28 MED ORDER — MONTELUKAST SODIUM 10 MG PO TABS
10.0000 mg | ORAL_TABLET | Freq: Every day | ORAL | Status: DC
  Administered 2020-08-28 – 2020-08-30 (×3): 10 mg via ORAL
  Filled 2020-08-28 (×3): qty 1

## 2020-08-28 NOTE — Progress Notes (Signed)
[facility] Progress Note  Patient: Melanie King    SVX:[id number]  DOA: 08/26/2020     Date of Service: the patient was seen and examined on 08/28/2020  Chief Complaint  Patient presents with   Shortness of Breath   Brief hospital course: Melanie King is a 75 y.o. female with medical history significant for Diastolic heart failure, CAD, pacemaker, CKD 3, HTN, diabetes, who presents to the emergency room with a 1 month history of shortness of breath with exertion and moving from room to room and a several day history of orthopnea that became acutely worse on the night of admission.  She denies chest pain though she states it feels like a fist in the middle of her chest.  Denies palpitations, nausea vomiting or diaphoresis.  Denies cough, fever or chills.  Has no lower extremity pain or swelling.  She has no abdominal pain or diarrhea. ED course: On arrival, afebrile, BP 173/58 with pulse of 76 respirations 22 with O2 sat in the low 90s on room air.  Blood work significant for first troponin of 168 and BNP 659.  Creatinine 1.79, up from baseline of 1.47.  WBC 13,000 with hemoglobin 9.2 down from a baseline of 11.5 three weeks prior. EKG, personally viewed and interpreted NSR at 75 with RBBB and LAFB and no acute ST-T wave changes Imaging: CXR with moderate cardiogenic failure   Patient treated with IV Lasix and also with duo nebs.  Hospitalist consulted for admission  Assessment and Plan:  Acute on chronic diastolic CHF -Patient with shortness of breath, orthopnea, BNP 659 and chest x-ray with moderate cardiogenic failure -IV Lasix 40 every 12 hours - Continue home beta-blocker and ACE/ARB - Daily weights, intake and output monitoring - Follow 2D echocardiogram - Cardiology consult, no need of ischemic work-up   COPD with acute exacerbation  - Solu-Medrol 125 mg IV 1 dose, followed by IV Solu-Medrol 40 every 12 hourly, gradually transition to oral prednisone after  improvement Started Breo Ellipta inhaler Continue DuoNeb every 6 hourly scheduled and transition to as needed after improvement No antibiotics ordered at this time     Elevated troponin   CAD (coronary artery disease) - Troponin [phone], EKG nonacute - Likely demand ischemia - Continue home antiplatelet and statin and beta-blocker     Acute kidney injury superimposed on CKD lllb (HCC) - Creatinine 1.79, up from baseline 1.47 - Continue to monitor   Hyponatremia most likely due to diuretics Serum osmolality 298 Monitor sodium level daily  Vitamin D deficiency, started vitamin D supplement  Hypertension, blood pressure elevated, hypertensive urgency Continue amlodipine, metoprolol, losartan      Type 2 diabetes mellitus without complication, - Sliding scale insulin coverage Monitor FSBG, patient remains at high risk for hyperglycemia secondary to steroids   Cardiac pacemaker -Pacemaker interrogation     Body mass index is 27.59 kg/m.  Interventions:       Diet: Heart healthy and carb modified DVT Prophylaxis: Subcutaneous Lovenox   Advance goals of care discussion: Full code  Family Communication: family was not present at bedside, at the time of interview.  The pt provided permission to discuss medical plan with the family. Opportunity was given to ask question and all questions were answered satisfactorily.   Disposition:  Pt is from Home, admitted with resp failure CHF/COPD, still has resp failure, which precludes a safe discharge. Discharge to Home, when clinically stable, most likely in 2 to 3 days.  Subjective: No acute overnight  issues, patient feels improvement in the shortness of breath.  Denied any active issues.  No chest pain or palpitations.   Physical Exam: General:  alert oriented to time, place, and person.  Appear in mild distress, affect appropriate Eyes: PERRLA ENT: Oral Mucosa Clear, moist  Neck: no JVD,  Cardiovascular: S1 and S2  Present, no Murmur,  Respiratory: increased respiratory effort, Bilateral Air entry equal and Decreased, mild Crackles, b/l wheezes, gradually improving Abdomen: Bowel Sound present, Soft and no tenderness,  Skin: no rashes Extremities: no Pedal edema, no calf tenderness Neurologic: without any new focal findings Gait not checked due to patient safety concerns  Vitals:   08/28/20 0400 08/28/20 0517 08/28/20 0809 08/28/20 1227  BP: (!) 157/77  (!) 185/67 (!) 180/64  Pulse: 70  71 69  Resp: 20  18 20   Temp: (!) 97.5 F (36.4 C)  97.6 F (36.4 C) 98 F (36.7 C)  TempSrc: Oral  Oral   SpO2: 96% 96% 97% 100%  Weight:      Height:        Intake/Output Summary (Last 24 hours) at 08/28/2020 1247 Last data filed at 08/28/2020 0855 Gross per 24 hour  Intake 483 ml  Output 2100 ml  Net -1617 ml   Filed Weights   08/26/20 2019 08/28/20 0235  Weight: 66.2 kg 67.4 kg    Data Reviewed: I have personally reviewed and interpreted daily labs, tele strips, imagings as discussed above. I reviewed all nursing notes, pharmacy notes, vitals, pertinent old records I have discussed plan of care as described above with RN and patient/family.  CBC: Recent Labs  Lab 08/26/20 2239 08/28/20 0354  WBC 13.1* 13.2*  NEUTROABS 10.5*  --   HGB 9.2* 9.4*  HCT 27.9* 27.4*  MCV 94.6 92.9  PLT 343 355   Basic Metabolic Panel: Recent Labs  Lab 08/26/20 2239 08/28/20 0354  NA 132* 130*  K 4.5 4.2  CL 99 94*  CO2 25 27  GLUCOSE 225* 270*  BUN 36* 46*  CREATININE 1.79* 1.51*  CALCIUM 9.0 9.5  MG  --  2.3  PHOS  --  4.0    Studies: No results found.  Scheduled Meds:  amLODipine  10 mg Oral Daily   enoxaparin (LOVENOX) injection  30 mg Subcutaneous Q24H   fluticasone furoate-vilanterol  1 puff Inhalation Daily   furosemide  40 mg Intravenous BID   insulin aspart  0-15 Units Subcutaneous TID WC   insulin aspart  0-5 Units Subcutaneous QHS   ipratropium-albuterol  3 mL Nebulization Q6H    methylPREDNISolone (SOLU-MEDROL) injection  40 mg Intravenous Q12H   metoprolol tartrate  50 mg Oral BID   multivitamin with minerals  1 tablet Oral Daily   nitroGLYCERIN  0.5 inch Topical Q6H   Ensure Max Protein  11 oz Oral BID   sodium chloride flush  3 mL Intravenous Q12H   Vitamin D (Ergocalciferol)  50,000 Units Oral Q7 days   Continuous Infusions:  sodium chloride     PRN Meds: sodium chloride, acetaminophen, albuterol, methocarbamol, ondansetron (ZOFRAN) IV, sodium chloride flush  Time spent: 35 minutes  Author: 08/30/20. MD [facility] 08/28/2020 12:47 PM  To reach On-call, see care teams to locate the attending and reach out to them via [email or web address]. If 7PM-7AM, please contact night-coverage If you still have difficulty reaching the attending provider, please page the [facility] (Director on Call) for [facility] on amion for assistance.

## 2020-08-28 NOTE — Progress Notes (Signed)
[facility] Cardiology  SUBJECTIVE: Patient laying in bed, reports feeling better, less shortness of breath, denies chest pain   Vitals:   08/28/20 0235 08/28/20 0400 08/28/20 0517 08/28/20 0809  BP:  (!) 157/77  (!) 185/67  Pulse:  70  71  Resp:  20  18  Temp:  (!) 97.5 F (36.4 C)  97.6 F (36.4 C)  TempSrc:  Oral  Oral  SpO2:  96% 96% 97%  Weight: 67.4 kg     Height:         Intake/Output Summary (Last 24 hours) at 08/28/2020 0941 Last data filed at 08/28/2020 5784 Gross per 24 hour  Intake 483 ml  Output 2100 ml  Net -1617 ml      PHYSICAL EXAM  General: Well developed, well nourished, in no acute distress HEENT:  Normocephalic and atramatic Neck:  No JVD.  Lungs: Clear bilaterally to auscultation and percussion. Heart: HRRR . Normal S1 and S2 without gallops or murmurs.  Abdomen: Bowel sounds are positive, abdomen soft and non-tender  Msk:  Back normal, normal gait. Normal strength and tone for age. Extremities: No clubbing, cyanosis or edema.   Neuro: Alert and oriented X 3. Psych:  Good affect, responds appropriately   LABS: Basic Metabolic Panel: Recent Labs    08/26/20 2239 08/28/20 0354  NA 132* 130*  K 4.5 4.2  CL 99 94*  CO2 25 27  GLUCOSE 225* 270*  BUN 36* 46*  CREATININE 1.79* 1.51*  CALCIUM 9.0 9.5  MG  --  2.3  PHOS  --  4.0   Liver Function Tests: Recent Labs    08/26/20 2239  AST 14*  ALT <5  ALKPHOS 76  BILITOT 0.7  PROT 7.6  ALBUMIN 3.6   No results for input(s): LIPASE, AMYLASE in the last 72 hours. CBC: Recent Labs    08/26/20 2239 08/28/20 0354  WBC 13.1* 13.2*  NEUTROABS 10.5*  --   HGB 9.2* 9.4*  HCT 27.9* 27.4*  MCV 94.6 92.9  PLT 343 355   Cardiac Enzymes: No results for input(s): CKTOTAL, CKMB, CKMBINDEX, TROPONINI in the last 72 hours. BNP: Invalid input(s): POCBNP D-Dimer: No results for input(s): DDIMER in the last 72 hours. Hemoglobin A1C: Recent Labs    08/27/20 0434  HGBA1C 7.8*   Fasting Lipid  Panel: No results for input(s): CHOL, HDL, LDLCALC, TRIG, CHOLHDL, LDLDIRECT in the last 72 hours. Thyroid Function Tests: No results for input(s): TSH, T4TOTAL, T3FREE, THYROIDAB in the last 72 hours.  Invalid input(s): FREET3 Anemia Panel: Recent Labs    08/27/20 0959  VITAMINB12 6,040*  FOLATE 7.4  TIBC 448  IRON 65    DG Chest Port 1 View  Result Date: 08/26/2020 CLINICAL DATA:  Dyspnea EXAM: PORTABLE CHEST 1 VIEW COMPARISON:  08/05/2020 FINDINGS: The lungs are symmetrically well expanded. No pneumothorax or pleural effusion. The pulmonary vascularity is diffusely increased with moderate diffuse interstitial pulmonary edema present in keeping with changes of moderate cardiogenic failure. Cardiac size within normal limits. Left subclavian dual lead pacemaker is unchanged. No acute bone abnormality. IMPRESSION: Moderate cardiogenic failure. Electronically Signed   By: Helyn Numbers MD   On: 08/26/2020 21:42   ECHOCARDIOGRAM COMPLETE  Result Date: 08/27/2020    ECHOCARDIOGRAM REPORT   Patient Name:   Melanie King Date of Exam: 08/27/2020 Medical Rec #:  [id number]       Height:       61.0 in Accession #:    [id number]  Weight:       146.0 lb Date of Birth:  [birth year]       BSA:          1.652 m Patient Age:    75 years        BP:           164/62 mmHg Patient Gender: F               HR:           71 bpm. Exam Location:  [facility] Procedure: 2D Echo, Color Doppler and Cardiac Doppler Indications:     I50.31 CHF-Acute Diastolic  History:         Patient has prior history of Echocardiogram examinations. CHF,                  Pacemaker; Risk Factors:Current Smoker, Diabetes, Hypertension                  and Dyslipidemia.  Sonographer:     Humphrey Rolls RDCS (AE) Referring Phys:  [id number] Andris Baumann Diagnosing Phys: Marcina Millard MD  Sonographer Comments: Suboptimal parasternal window and no subcostal window. Image acquisition challenging due to patient body habitus and Image acquisition  challenging due to COPD. IMPRESSIONS  1. Left ventricular ejection fraction, by estimation, is 55 to 60%. The left ventricle has normal function. The left ventricle has no regional wall motion abnormalities. Left ventricular diastolic parameters were normal.  2. Right ventricular systolic function is normal. The right ventricular size is normal.  3. The mitral valve is normal in structure. Mild mitral valve regurgitation. No evidence of mitral stenosis.  4. The aortic valve is normal in structure. Aortic valve regurgitation is not visualized. Mild aortic valve stenosis.  5. The inferior vena cava is normal in size with greater than 50% respiratory variability, suggesting right atrial pressure of 3 mmHg. FINDINGS  Left Ventricle: Left ventricular ejection fraction, by estimation, is 55 to 60%. The left ventricle has normal function. The left ventricle has no regional wall motion abnormalities. The left ventricular internal cavity size was normal in size. There is  no left ventricular hypertrophy. Left ventricular diastolic parameters were normal. Right Ventricle: The right ventricular size is normal. No increase in right ventricular wall thickness. Right ventricular systolic function is normal. Left Atrium: Left atrial size was normal in size. Right Atrium: Right atrial size was normal in size. Pericardium: There is no evidence of pericardial effusion. Mitral Valve: The mitral valve is normal in structure. Mild mitral valve regurgitation. No evidence of mitral valve stenosis. MV peak gradient, 9.2 mmHg. The mean mitral valve gradient is 3.0 mmHg. Tricuspid Valve: The tricuspid valve is normal in structure. Tricuspid valve regurgitation is mild . No evidence of tricuspid stenosis. Aortic Valve: The aortic valve is normal in structure. Aortic valve regurgitation is not visualized. Mild aortic stenosis is present. Aortic valve mean gradient measures 10.0 mmHg. Aortic valve peak gradient measures 15.5 mmHg. Aortic valve  area, by VTI measures 1.61 cm. Pulmonic Valve: The pulmonic valve was normal in structure. Pulmonic valve regurgitation is not visualized. No evidence of pulmonic stenosis. Aorta: The aortic root is normal in size and structure. Venous: The inferior vena cava is normal in size with greater than 50% respiratory variability, suggesting right atrial pressure of 3 mmHg. IAS/Shunts: No atrial level shunt detected by color flow Doppler.  LEFT VENTRICLE PLAX 2D LVIDd:         4.00 cm LVIDs:  2.80 cm LV PW:         1.20 cm LV IVS:        1.00 cm LVOT diam:     1.90 cm LV SV:         69 LV SV Index:   42 LVOT Area:     2.84 cm  LV Volumes (MOD) LV vol d, MOD A2C: 85.9 ml LV vol d, MOD A4C: 78.3 ml LV vol s, MOD A2C: 39.7 ml LV vol s, MOD A4C: 33.1 ml LV SV MOD A2C:     46.2 ml LV SV MOD A4C:     78.3 ml LV SV MOD BP:      54.4 ml LEFT ATRIUM             Index LA diam:        3.50 cm 2.12 cm/m LA Vol (A2C):   73.1 ml 44.24 ml/m LA Vol (A4C):   42.6 ml 25.78 ml/m LA Biplane Vol: 59.1 ml 35.77 ml/m  AORTIC VALVE                    PULMONIC VALVE AV Area (Vmax):    1.42 cm     PV Vmax:       1.00 m/s AV Area (Vmean):   1.36 cm     PV Vmean:      65.500 cm/s AV Area (VTI):     1.61 cm     PV VTI:        0.188 m AV Vmax:           197.00 cm/s  PV Peak grad:  4.0 mmHg AV Vmean:          151.000 cm/s PV Mean grad:  2.0 mmHg AV VTI:            0.428 m AV Peak Grad:      15.5 mmHg AV Mean Grad:      10.0 mmHg LVOT Vmax:         98.50 cm/s LVOT Vmean:        72.200 cm/s LVOT VTI:          0.243 m LVOT/AV VTI ratio: 0.57  AORTA Ao Root diam: 2.60 cm MITRAL VALVE MV Area (PHT): 3.21 cm     SHUNTS MV Area VTI:   1.67 cm     Systemic VTI:  0.24 m MV Peak grad:  9.2 mmHg     Systemic Diam: 1.90 cm MV Mean grad:  3.0 mmHg MV Vmax:       1.52 m/s MV Vmean:      81.2 cm/s MV Decel Time: 236 msec MV E velocity: 144.00 cm/s MV A velocity: 80.80 cm/s MV E/A ratio:  1.78 Marcina Millard MD Electronically signed by Marcina Millard MD Signature Date/Time: 08/27/2020/1:33:01 PM    Final      Echo LVEF 55 to 60%  TELEMETRY: Atrial pacing 70 bpm:  ASSESSMENT AND PLAN:  Active Problems:   COPD with acute exacerbation (HCC)   CAD (coronary artery disease)   Hypertension   Type 2 diabetes mellitus without complication, with long-term current use of insulin (HCC)   Acute on chronic diastolic CHF (congestive heart failure) (HCC)   Acute kidney injury superimposed on CKD lllb (HCC)   Cardiac pacemaker   Elevated troponin   CHF (congestive heart failure) (HCC)   Malnutrition of moderate degree    1.  Respiratory failure, likely multifactorial, secondary to acute on  chronic diastolic congestive heart failure, COPD exacerbation, complicated by underlying chronic kidney disease stage IIIb, improving 2.  Acute on chronic diastolic congestive heart failure, normal left ventricular function by 2D echocardiogram 07/15/2019, clinically improved after initial diuresis with intravenous furosemide 3.  COPD exacerbation, with ongoing tobacco abuse 4.  Chronic kidney disease stage IIIb, BUN and creatinine 46 and 1.51, GFR 36 5.  Elevated troponin, without significant delta, in the absence of chest pain, without acute ischemic ST-T wave changes, likely demand supply ischemia due to respiratory failure 6.  Sick sinus syndrome, status post dual-chamber pacemaker 7.  Essential hypertension, blood pressure elevated   Recommendations   1.  Agree with overall current therapy 2.  Continue diuresis 3.  Carefully monitor renal status 4.  Uptitrate metoprolol tartrate 50 mg twice daily 5.  Continue amlodipine 10 mg daily 6.  Defer full dose anticoagulation 7.  Defer cardiac catheterization in the absence of chest pain     Marcina Millard, MD, PhD, [facility] 08/28/2020 9:41 AM

## 2020-08-28 NOTE — Plan of Care (Signed)

## 2020-08-29 LAB — GLUCOSE, CAPILLARY
Glucose-Capillary: 233 mg/dL — ABNORMAL HIGH (ref 70–99)
Glucose-Capillary: 256 mg/dL — ABNORMAL HIGH (ref 70–99)
Glucose-Capillary: 257 mg/dL — ABNORMAL HIGH (ref 70–99)
Glucose-Capillary: 265 mg/dL — ABNORMAL HIGH (ref 70–99)
Glucose-Capillary: 278 mg/dL — ABNORMAL HIGH (ref 70–99)
Glucose-Capillary: 310 mg/dL — ABNORMAL HIGH (ref 70–99)
Glucose-Capillary: 326 mg/dL — ABNORMAL HIGH (ref 70–99)

## 2020-08-29 LAB — BASIC METABOLIC PANEL
Anion gap: 9 (ref 5–15)
BUN: 60 mg/dL — ABNORMAL HIGH (ref 8–23)
CO2: 28 mmol/L (ref 22–32)
Calcium: 9.8 mg/dL (ref 8.9–10.3)
Chloride: 94 mmol/L — ABNORMAL LOW (ref 98–111)
Creatinine, Ser: 1.45 mg/dL — ABNORMAL HIGH (ref 0.44–1.00)
GFR, Estimated: 38 mL/min — ABNORMAL LOW (ref >60)
Glucose, Bld: 239 mg/dL — ABNORMAL HIGH (ref 70–99)
Potassium: 4.5 mmol/L (ref 3.5–5.1)
Sodium: 131 mmol/L — ABNORMAL LOW (ref 135–145)

## 2020-08-29 LAB — PHOSPHORUS: Phosphorus: 4.9 mg/dL — ABNORMAL HIGH (ref 2.5–4.6)

## 2020-08-29 LAB — CBC
HCT: 29.3 % — ABNORMAL LOW (ref 36.0–46.0)
Hemoglobin: 9.8 g/dL — ABNORMAL LOW (ref 12.0–15.0)
MCH: 31.5 pg (ref 26.0–34.0)
MCHC: 33.4 g/dL (ref 30.0–36.0)
MCV: 94.2 fL (ref 80.0–100.0)
Platelets: 375 10*3/uL (ref 150–400)
RBC: 3.11 MIL/uL — ABNORMAL LOW (ref 3.87–5.11)
RDW: 17.8 % — ABNORMAL HIGH (ref 11.5–15.5)
WBC: 11.8 10*3/uL — ABNORMAL HIGH (ref 4.0–10.5)
nRBC: 0 % (ref 0.0–0.2)

## 2020-08-29 LAB — MAGNESIUM: Magnesium: 2.6 mg/dL — ABNORMAL HIGH (ref 1.7–2.4)

## 2020-08-29 MED ORDER — PREDNISONE 50 MG PO TABS: 50.0000 mg | ORAL_TABLET | Freq: Every day | ORAL | Status: DC

## 2020-08-29 MED ORDER — METHYLPREDNISOLONE SODIUM SUCC 40 MG IJ SOLR
40.0000 mg | Freq: Once | INTRAMUSCULAR | Status: AC
  Administered 2020-08-29: 40 mg via INTRAVENOUS
  Filled 2020-08-29: qty 1

## 2020-08-29 NOTE — TOC Initial Note (Signed)
Transition of Care [facility]) - Initial/Assessment Note    Patient Details  Name: Melanie King MRN: [id number] Date of Birth: 11/30/1945  Transition of Care [facility]) CM/SW Contact:    Chapman Fitch, RN Phone Number: 08/29/2020, 1:05 PM  Clinical Narrative:                    Patient admitted from the [facility] ALF Patient alert and oriented  x1. Hard of Hearing.   Attempted to reach out to 4 contacts listed.  2 were incorrect numbers, Voicemail left for the other 2 contacts listed  Received return call from cousin.  She was unable to provide me with any information  PT recommending home health.  Voicemail left for [facility] to discuss discharge planning  Will need Fl2 at discharge with updated medication list      Patient Goals and CMS Choice        Expected Discharge Plan and Services                                                Prior Living Arrangements/Services                       Activities of Daily Living Home Assistive Devices/Equipment: None ADL Screening (condition at time of admission) Patient's cognitive ability adequate to safely complete daily activities?: Yes Is the patient deaf or have difficulty hearing?: No Does the patient have difficulty seeing, even when wearing glasses/contacts?: No Does the patient have difficulty concentrating, remembering, or making decisions?: No Patient able to express need for assistance with ADLs?: Yes Does the patient have difficulty dressing or bathing?: No Independently performs ADLs?: Yes (appropriate for developmental age) Does the patient have difficulty walking or climbing stairs?: No Weakness of Legs: None Weakness of Arms/Hands: None  Permission Sought/Granted                  Emotional Assessment              Admission diagnosis:  CHF (congestive heart failure) (HCC) [I50.9] SOB (shortness of breath) [R06.02] COPD exacerbation (HCC) [J44.1] Acute respiratory failure with  hypoxia (HCC) [J96.01] Acute on chronic congestive heart failure, unspecified heart failure type (HCC) [I50.9] Patient Active Problem List   Diagnosis Date Noted   Malnutrition of moderate degree 08/28/2020   Elevated troponin 08/26/2020   CHF (congestive heart failure) (HCC) 08/26/2020   Carotid stenosis 04/12/2020   Dysphagia 07/19/2019   Acute respiratory failure due to COVID-19 (HCC) 07/16/2019   Respiratory distress    Acute on chronic congestive heart failure (HCC)    Acute respiratory failure with hypoxia (HCC) 07/15/2019   Frequent falls 05/26/2019   Generalized weakness 05/26/2019   Acute kidney injury superimposed on CKD lllb (HCC) 03/21/2019   Subdural hematoma (HCC) 03/20/2019   Cardiac pacemaker 03/17/2019   Hypothyroidism 03/10/2019   Depression    Acute on chronic diastolic CHF (congestive heart failure) (HCC)    Acute metabolic encephalopathy    Fall    Tobacco abuse    C6 cervical fracture (HCC)    Left hand pain 04/01/2018   Numbness and tingling in left hand 04/01/2018   Left hand weakness 03/05/2018   Confusion 05/12/2017   Seizure (HCC) 05/10/2017   Elevated sedimentation rate 04/04/2017   Elevated C-reactive protein (CRP) 04/04/2017  Chest pain, unspecified 04/02/2017   Bilateral lower extremity pain (Secondary Area of Pain) (R>L) 04/02/2017   Chronic hip pain, bilateral  (Secondary Area of Pain) (R>L) 04/02/2017   Chronic bilateral low back pain with bilateral sciatica [facility] Area of Pain) (R>L) 04/02/2017   Chronic pain syndrome 04/02/2017   Disorder of bone, unspecified 04/02/2017   Other long term (current) drug therapy 04/02/2017   Other specified health status 04/02/2017   Long term current use of opiate analgesic 04/02/2017   Hip pain, bilateral 12/06/2016   Tremor 05/27/2016   Trigger finger of left hand 02/16/2016   Osteoarthritis of both hands 02/16/2016   Angina pectoris (HCC) 01/17/2016   COPD with acute exacerbation (HCC) 01/17/2016    CAD (coronary artery disease) 01/17/2016   HLD (hyperlipidemia) 01/17/2016   Hypertension 01/17/2016   Sick sinus syndrome (HCC) 01/17/2016   Chronic pain 01/17/2016   Personal history of tobacco use, presenting hazards to health 11/16/2015   Type 2 diabetes mellitus without complication, with long-term current use of insulin (HCC) 05/12/2013   PCP:  Marguarite Arbour, MD Pharmacy:   [facility] [place], [place] - [address] [place] [address] Phone: [phone] Fax: [phone]  [id number] - [place], [place] - [address] [place] [address] Phone: [phone] Fax: [phone]  [facility] - [place], [place] - [address] [place] [address] Phone: [phone] Fax: [phone]  [facility] - [place], [place] - [address] [place] [address] Phone: [phone] Fax: [phone]     Social Determinants of Health (SDOH) Interventions    Readmission Risk Interventions No flowsheet data found.

## 2020-08-29 NOTE — Progress Notes (Signed)
[facility] Cardiology  SUBJECTIVE: Patient laying in bed, reports feeling better, wishes to go home   Vitals:   08/28/20 2331 08/29/20 0500 08/29/20 0754 08/29/20 0809  BP: (!) 167/70  (!) 153/91   Pulse: 72  70   Resp:   17   Temp:   98.1 F (36.7 C)   TempSrc:   Oral   SpO2:   98% 98%  Weight:  67.5 kg    Height:         Intake/Output Summary (Last 24 hours) at 08/29/2020 1013 Last data filed at 08/28/2020 1300 Gross per 24 hour  Intake 480 ml  Output 900 ml  Net -420 ml      PHYSICAL EXAM  General: Well developed, well nourished, in no acute distress HEENT:  Normocephalic and atramatic Neck:  No JVD.  Lungs: Clear bilaterally to auscultation and percussion. Heart: HRRR . Normal S1 and S2 without gallops or murmurs.  Abdomen: Bowel sounds are positive, abdomen soft and non-tender  Msk:  Back normal, normal gait. Normal strength and tone for age. Extremities: No clubbing, cyanosis or edema.   Neuro: Alert and oriented X 3. Psych:  Good affect, responds appropriately   LABS: Basic Metabolic Panel: Recent Labs    08/28/20 0354 08/29/20 0353  NA 130* 131*  K 4.2 4.5  CL 94* 94*  CO2 27 28  GLUCOSE 270* 239*  BUN 46* 60*  CREATININE 1.51* 1.45*  CALCIUM 9.5 9.8  MG 2.3 2.6*  PHOS 4.0 4.9*   Liver Function Tests: Recent Labs    08/26/20 2239  AST 14*  ALT <5  ALKPHOS 76  BILITOT 0.7  PROT 7.6  ALBUMIN 3.6   No results for input(s): LIPASE, AMYLASE in the last 72 hours. CBC: Recent Labs    08/26/20 2239 08/28/20 0354 08/29/20 0353  WBC 13.1* 13.2* 11.8*  NEUTROABS 10.5*  --   --   HGB 9.2* 9.4* 9.8*  HCT 27.9* 27.4* 29.3*  MCV 94.6 92.9 94.2  PLT 343 355 375   Cardiac Enzymes: No results for input(s): CKTOTAL, CKMB, CKMBINDEX, TROPONINI in the last 72 hours. BNP: Invalid input(s): POCBNP D-Dimer: No results for input(s): DDIMER in the last 72 hours. Hemoglobin A1C: Recent Labs    08/27/20 0434  HGBA1C 7.8*   Fasting Lipid Panel: No  results for input(s): CHOL, HDL, LDLCALC, TRIG, CHOLHDL, LDLDIRECT in the last 72 hours. Thyroid Function Tests: No results for input(s): TSH, T4TOTAL, T3FREE, THYROIDAB in the last 72 hours.  Invalid input(s): FREET3 Anemia Panel: Recent Labs    08/27/20 0959  VITAMINB12 6,040*  FOLATE 7.4  TIBC 448  IRON 65    ECHOCARDIOGRAM COMPLETE  Result Date: 08/27/2020    ECHOCARDIOGRAM REPORT   Patient Name:   [place] F Reith Date of Exam: 08/27/2020 Medical Rec #:  [id number]       Height:       61.0 in Accession #:    [id number]      Weight:       146.0 lb Date of Birth:  1946-02-16       BSA:          1.652 m Patient Age:    74 years        BP:           164/62 mmHg Patient Gender: F               HR:           71  bpm. Exam Location:  [facility] Procedure: 2D Echo, Color Doppler and Cardiac Doppler Indications:     I50.31 CHF-Acute Diastolic  History:         Patient has prior history of Echocardiogram examinations. CHF,                  Pacemaker; Risk Factors:Current Smoker, Diabetes, Hypertension                  and Dyslipidemia.  Sonographer:     Humphrey Rolls RDCS (AE) Referring Phys:  [id number] Andris Baumann Diagnosing Phys: Marcina Millard MD  Sonographer Comments: Suboptimal parasternal window and no subcostal window. Image acquisition challenging due to patient body habitus and Image acquisition challenging due to COPD. IMPRESSIONS  1. Left ventricular ejection fraction, by estimation, is 55 to 60%. The left ventricle has normal function. The left ventricle has no regional wall motion abnormalities. Left ventricular diastolic parameters were normal.  2. Right ventricular systolic function is normal. The right ventricular size is normal.  3. The mitral valve is normal in structure. Mild mitral valve regurgitation. No evidence of mitral stenosis.  4. The aortic valve is normal in structure. Aortic valve regurgitation is not visualized. Mild aortic valve stenosis.  5. The inferior vena cava is normal in  size with greater than 50% respiratory variability, suggesting right atrial pressure of 3 mmHg. FINDINGS  Left Ventricle: Left ventricular ejection fraction, by estimation, is 55 to 60%. The left ventricle has normal function. The left ventricle has no regional wall motion abnormalities. The left ventricular internal cavity size was normal in size. There is  no left ventricular hypertrophy. Left ventricular diastolic parameters were normal. Right Ventricle: The right ventricular size is normal. No increase in right ventricular wall thickness. Right ventricular systolic function is normal. Left Atrium: Left atrial size was normal in size. Right Atrium: Right atrial size was normal in size. Pericardium: There is no evidence of pericardial effusion. Mitral Valve: The mitral valve is normal in structure. Mild mitral valve regurgitation. No evidence of mitral valve stenosis. MV peak gradient, 9.2 mmHg. The mean mitral valve gradient is 3.0 mmHg. Tricuspid Valve: The tricuspid valve is normal in structure. Tricuspid valve regurgitation is mild . No evidence of tricuspid stenosis. Aortic Valve: The aortic valve is normal in structure. Aortic valve regurgitation is not visualized. Mild aortic stenosis is present. Aortic valve mean gradient measures 10.0 mmHg. Aortic valve peak gradient measures 15.5 mmHg. Aortic valve area, by VTI measures 1.61 cm. Pulmonic Valve: The pulmonic valve was normal in structure. Pulmonic valve regurgitation is not visualized. No evidence of pulmonic stenosis. Aorta: The aortic root is normal in size and structure. Venous: The inferior vena cava is normal in size with greater than 50% respiratory variability, suggesting right atrial pressure of 3 mmHg. IAS/Shunts: No atrial level shunt detected by color flow Doppler.  LEFT VENTRICLE PLAX 2D LVIDd:         4.00 cm LVIDs:         2.80 cm LV PW:         1.20 cm LV IVS:        1.00 cm LVOT diam:     1.90 cm LV SV:         69 LV SV Index:   42 LVOT  Area:     2.84 cm  LV Volumes (MOD) LV vol d, MOD A2C: 85.9 ml LV vol d, MOD A4C: 78.3 ml LV vol s, MOD A2C: 39.7  ml LV vol s, MOD A4C: 33.1 ml LV SV MOD A2C:     46.2 ml LV SV MOD A4C:     78.3 ml LV SV MOD BP:      54.4 ml LEFT ATRIUM             Index LA diam:        3.50 cm 2.12 cm/m LA Vol (A2C):   73.1 ml 44.24 ml/m LA Vol (A4C):   42.6 ml 25.78 ml/m LA Biplane Vol: 59.1 ml 35.77 ml/m  AORTIC VALVE                    PULMONIC VALVE AV Area (Vmax):    1.42 cm     PV Vmax:       1.00 m/s AV Area (Vmean):   1.36 cm     PV Vmean:      65.500 cm/s AV Area (VTI):     1.61 cm     PV VTI:        0.188 m AV Vmax:           197.00 cm/s  PV Peak grad:  4.0 mmHg AV Vmean:          151.000 cm/s PV Mean grad:  2.0 mmHg AV VTI:            0.428 m AV Peak Grad:      15.5 mmHg AV Mean Grad:      10.0 mmHg LVOT Vmax:         98.50 cm/s LVOT Vmean:        72.200 cm/s LVOT VTI:          0.243 m LVOT/AV VTI ratio: 0.57  AORTA Ao Root diam: 2.60 cm MITRAL VALVE MV Area (PHT): 3.21 cm     SHUNTS MV Area VTI:   1.67 cm     Systemic VTI:  0.24 m MV Peak grad:  9.2 mmHg     Systemic Diam: 1.90 cm MV Mean grad:  3.0 mmHg MV Vmax:       1.52 m/s MV Vmean:      81.2 cm/s MV Decel Time: 236 msec MV E velocity: 144.00 cm/s MV A velocity: 80.80 cm/s MV E/A ratio:  1.78 Marcina Millard MD Electronically signed by Marcina Millard MD Signature Date/Time: 08/27/2020/1:33:01 PM    Final      Echo LVEF 55 to 60%  TELEMETRY: Atrial pacing 70 bpm:  ASSESSMENT AND PLAN:  Active Problems:   COPD with acute exacerbation (HCC)   CAD (coronary artery disease)   Hypertension   Type 2 diabetes mellitus without complication, with long-term current use of insulin (HCC)   Acute on chronic diastolic CHF (congestive heart failure) (HCC)   Acute kidney injury superimposed on CKD lllb (HCC)   Cardiac pacemaker   Elevated troponin   CHF (congestive heart failure) (HCC)   Malnutrition of moderate degree    1. Respiratory  failure, likely multifactorial, secondary to acute on chronic diastolic congestive heart failure, COPD exacerbation, complicated by underlying chronic kidney disease stage IIIb, improving 2.  Acute on chronic diastolic congestive heart failure, normal left ventricular function by 2D echocardiogram 07/15/2019, clinically improved after initial diuresis with intravenous furosemide 3.  COPD exacerbation, with ongoing tobacco abuse 4.  Chronic kidney disease stage IIIb, BUN and creatinine 46 and 1.51, GFR 36 5.  Elevated troponin, without significant delta, in the absence of chest pain, without acute ischemic ST-T wave changes,  likely demand supply ischemia due to respiratory failure 6.  Sick sinus syndrome, status post dual-chamber pacemaker 7.  Essential hypertension, blood pressure elevated   Recommendations   1.  Agree with overall current therapy 2.  Continue diuresis 3.  Carefully monitor renal status 4.  Continue metoprolol tartrate 50 mg twice daily 5.  Continue amlodipine 10 mg daily 6.  Defer full dose anticoagulation 7.  Defer cardiac catheterization in the absence of chest pain   Marcina Millard, MD, PhD, [facility] 08/29/2020 10:13 AM

## 2020-08-29 NOTE — Progress Notes (Signed)
[facility] Progress Note  Patient: Melanie King    YQI:[id number]  DOA: 08/26/2020     Date of Service: the patient was seen and examined on 08/29/2020  Chief Complaint  Patient presents with   Shortness of Breath   Brief hospital course: Melanie F Priest is a 75 y.o. female with medical history significant for Diastolic heart failure, CAD, pacemaker, CKD 3, HTN, diabetes, who presents to the emergency room with a 1 month history of shortness of breath with exertion and moving from room to room and a several day history of orthopnea that became acutely worse on the night of admission.  She denies chest pain though she states it feels like a fist in the middle of her chest.  Denies palpitations, nausea vomiting or diaphoresis.  Denies cough, fever or chills.  Has no lower extremity pain or swelling.  She has no abdominal pain or diarrhea. ED course: On arrival, afebrile, BP 173/58 with pulse of 76 respirations 22 with O2 sat in the low 90s on room air.  Blood work significant for first troponin of 168 and BNP 659.  Creatinine 1.79, up from baseline of 1.47.  WBC 13,000 with hemoglobin 9.2 down from a baseline of 11.5 three weeks prior. EKG, personally viewed and interpreted NSR at 75 with RBBB and LAFB and no acute ST-T wave changes Imaging: CXR with moderate cardiogenic failure   Patient treated with IV Lasix and also with duo nebs.  Hospitalist consulted for admission  Assessment and Plan:  Acute on chronic diastolic CHF -Patient with shortness of breath, orthopnea, BNP 659 and chest x-ray with moderate cardiogenic failure -IV Lasix 40 every 12 hours - Continue home beta-blocker and ACE/ARB - Daily weights, intake and output monitoring - TTE shows LVEF 55 to 60%, no regional wall motion abnormality. - Cardiology consult, no need of ischemic work-up Follow repeat chest x-ray tomorrow a.m., and consider transition to oral Lasix   COPD with acute exacerbation  - s/p Solu-Medrol  125 mg IV 1 dose, s/p IV Solu-Medrol 40 every 12 hourly, breathing significantly improved, minimal wheezing now. 6/27 started prednisone 50 mg p.o. daily for 3 days Started Breo Ellipta inhaler Continue DuoNeb every 6 hourly scheduled and transition to as needed after improvement No antibiotics ordered at this time     Elevated troponin   CAD (coronary artery disease) - Troponin 704 420 4175, EKG nonacute - Likely demand ischemia - Continue home antiplatelet and statin and beta-blocker     Acute kidney injury superimposed on CKD lllb (HCC) - Creatinine 1.79, up from baseline 1.47 - Continue to monitor   Hyponatremia most likely due to diuretics Serum osmolality 298 Monitor sodium level daily  Vitamin D deficiency, started vitamin D supplement  Hypertension, blood pressure elevated, hypertensive urgency Continue amlodipine, metoprolol, losartan      Type 2 diabetes mellitus without complication, - Sliding scale insulin coverage Monitor FSBG, patient remains at high risk for hyperglycemia secondary to steroids   Cardiac pacemaker -Pacemaker interrogation     Body mass index is 27.59 kg/m.  Interventions:       Diet: Heart healthy and carb modified DVT Prophylaxis: Subcutaneous Lovenox   Advance goals of care discussion: Full code  Family Communication: family was not present at bedside, at the time of interview.  The pt provided permission to discuss medical plan with the family. Opportunity was given to ask question and all questions were answered satisfactorily.   Disposition:  Pt is from Home, admitted with resp  failure CHF/COPD, still has resp failure, which precludes a safe discharge. Discharge to Home, when clinically stable, most likely in 1-2 days. PT/OT eval done, recommended home health 24/7 supervision and rolling walker Please make sure that patient has 24/7 care, otherwise consider SNF placement   Subjective: No acute overnight issues, patient feels  improvement in the shortness of breath.  Denied any active issues.  No chest pain or palpitations. Patient stated that she was able to walk to the bathroom but she needs to hold onto surrounding things.   Physical Exam: General:  alert oriented to time, place, and person.  Appear in mild distress, affect appropriate Eyes: PERRLA ENT: Oral Mucosa Clear, moist  Neck: no JVD,  Cardiovascular: S1 and S2 Present, no Murmur,  Respiratory: increased respiratory effort, Bilateral Air entry equal and Decreased, mild Crackles, b/l wheezes, gradually improving Abdomen: Bowel Sound present, Soft and no tenderness,  Skin: no rashes Extremities: no Pedal edema, no calf tenderness Neurologic: without any new focal findings Gait not checked due to patient safety concerns  Vitals:   08/28/20 2331 08/29/20 0500 08/29/20 0754 08/29/20 0809  BP: (!) 167/70  (!) 153/91   Pulse: 72  70   Resp:   17   Temp:   98.1 F (36.7 C)   TempSrc:   Oral   SpO2:   98% 98%  Weight:  67.5 kg    Height:        Intake/Output Summary (Last 24 hours) at 08/29/2020 1213 Last data filed at 08/28/2020 1300 Gross per 24 hour  Intake 480 ml  Output 900 ml  Net -420 ml   Filed Weights   08/26/20 2019 08/28/20 0235 08/29/20 0500  Weight: 66.2 kg 67.4 kg 67.5 kg    Data Reviewed: I have personally reviewed and interpreted daily labs, tele strips, imagings as discussed above. I reviewed all nursing notes, pharmacy notes, vitals, pertinent old records I have discussed plan of care as described above with RN and patient/family.  CBC: Recent Labs  Lab 08/26/20 2239 08/28/20 0354 08/29/20 0353  WBC 13.1* 13.2* 11.8*  NEUTROABS 10.5*  --   --   HGB 9.2* 9.4* 9.8*  HCT 27.9* 27.4* 29.3*  MCV 94.6 92.9 94.2  PLT 343 355 375   Basic Metabolic Panel: Recent Labs  Lab 08/26/20 2239 08/28/20 0354 08/29/20 0353  NA 132* 130* 131*  K 4.5 4.2 4.5  CL 99 94* 94*  CO2 25 27 28   GLUCOSE 225* 270* 239*  BUN 36*  46* 60*  CREATININE 1.79* 1.51* 1.45*  CALCIUM 9.0 9.5 9.8  MG  --  2.3 2.6*  PHOS  --  4.0 4.9*    Studies: No results found.  Scheduled Meds:  amLODipine  10 mg Oral Daily   aspirin  81 mg Oral Daily   benztropine  0.5 mg Oral BID   brexpiprazole  1 mg Oral QHS   carbidopa-levodopa  1 tablet Oral TID   citalopram  20 mg Oral Daily   enoxaparin (LOVENOX) injection  30 mg Subcutaneous Q24H   fluticasone  2 spray Each Nare Daily   fluticasone furoate-vilanterol  1 puff Inhalation Daily   furosemide  40 mg Intravenous BID   gabapentin  100 mg Oral QHS   insulin aspart  0-15 Units Subcutaneous TID WC   insulin aspart  0-5 Units Subcutaneous QHS   ipratropium-albuterol  3 mL Nebulization Q6H   levothyroxine  150 mcg Oral Daily   losartan  50 mg  Oral Daily   magnesium oxide  400 mg Oral Daily   methylPREDNISolone (SOLU-MEDROL) injection  40 mg Intravenous Q12H   metoprolol tartrate  50 mg Oral BID   montelukast  10 mg Oral Daily   multivitamin with minerals  1 tablet Oral Daily   nitroGLYCERIN  0.5 inch Topical Q6H   pantoprazole  40 mg Oral Daily   prednisoLONE acetate  1 drop Both Eyes BID   primidone  50 mg Oral BID   Ensure Max Protein  11 oz Oral BID   senna-docusate  1 tablet Oral BID   sodium chloride flush  3 mL Intravenous Q12H   vitamin B-12  2,000 mcg Oral Daily   Vitamin D (Ergocalciferol)  50,000 Units Oral Q7 days   Continuous Infusions:  sodium chloride     PRN Meds: sodium chloride, acetaminophen, albuterol, hydrALAZINE, hydrOXYzine, LORazepam, methocarbamol, ondansetron (ZOFRAN) IV, sodium chloride flush, traZODone  Time spent: 35 minutes  Author: Gillis Santa. MD [facility] 08/29/2020 12:13 PM  To reach On-call, see care teams to locate the attending and reach out to them via [email or web address]. If 7PM-7AM, please contact night-coverage If you still have difficulty reaching the attending provider, please page the [facility] (Director on Call) for [facility] on amion for assistance.

## 2020-08-30 ENCOUNTER — Inpatient Hospital Stay: Payer: Medicare Other

## 2020-08-30 DIAGNOSIS — R778 Other specified abnormalities of plasma proteins: Secondary | ICD-10-CM

## 2020-08-30 LAB — CBC
HCT: 32 % — ABNORMAL LOW (ref 36.0–46.0)
Hemoglobin: 10.7 g/dL — ABNORMAL LOW (ref 12.0–15.0)
MCH: 31.8 pg (ref 26.0–34.0)
MCHC: 33.4 g/dL (ref 30.0–36.0)
MCV: 95 fL (ref 80.0–100.0)
Platelets: 457 10*3/uL — ABNORMAL HIGH (ref 150–400)
RBC: 3.37 MIL/uL — ABNORMAL LOW (ref 3.87–5.11)
RDW: 17.8 % — ABNORMAL HIGH (ref 11.5–15.5)
WBC: 12.9 10*3/uL — ABNORMAL HIGH (ref 4.0–10.5)
nRBC: 0 % (ref 0.0–0.2)

## 2020-08-30 LAB — BASIC METABOLIC PANEL
Anion gap: 10 (ref 5–15)
BUN: 63 mg/dL — ABNORMAL HIGH (ref 8–23)
CO2: 25 mmol/L (ref 22–32)
Calcium: 9.9 mg/dL (ref 8.9–10.3)
Chloride: 96 mmol/L — ABNORMAL LOW (ref 98–111)
Creatinine, Ser: 1.8 mg/dL — ABNORMAL HIGH (ref 0.44–1.00)
GFR, Estimated: 29 mL/min — ABNORMAL LOW (ref >60)
Glucose, Bld: 380 mg/dL — ABNORMAL HIGH (ref 70–99)
Potassium: 4.2 mmol/L (ref 3.5–5.1)
Sodium: 131 mmol/L — ABNORMAL LOW (ref 135–145)

## 2020-08-30 LAB — GLUCOSE, CAPILLARY
Glucose-Capillary: 400 mg/dL — ABNORMAL HIGH (ref 70–99)
Glucose-Capillary: 302 mg/dL — ABNORMAL HIGH (ref 70–99)

## 2020-08-30 LAB — MAGNESIUM: Magnesium: 2.3 mg/dL (ref 1.7–2.4)

## 2020-08-30 LAB — TROPONIN I (HIGH SENSITIVITY): Troponin I (High Sensitivity): 159 ng/L (ref <18)

## 2020-08-30 LAB — PHOSPHORUS: Phosphorus: 4.8 mg/dL — ABNORMAL HIGH (ref 2.5–4.6)

## 2020-08-30 MED ORDER — FLUTICASONE FUROATE-VILANTEROL 200-25 MCG/INH IN AEPB: 1.0000 | INHALATION_SPRAY | Freq: Every day | RESPIRATORY_TRACT | 0 refills | Status: AC

## 2020-08-30 MED ORDER — IPRATROPIUM-ALBUTEROL 0.5-2.5 (3) MG/3ML IN SOLN
3.0000 mL | Freq: Three times a day (TID) | RESPIRATORY_TRACT | Status: DC
  Administered 2020-08-30: 3 mL via RESPIRATORY_TRACT
  Filled 2020-08-30: qty 3

## 2020-08-30 MED ORDER — TORSEMIDE 20 MG PO TABS: 20.0000 mg | ORAL_TABLET | Freq: Every day | ORAL | 0 refills | Status: AC

## 2020-08-30 NOTE — Discharge Summary (Signed)
Physician Discharge Summary  Patient ID: Melanie F Mceachern MRN: [id number] DOB/AGE: 75-Jun-1947 75 y.o.  Admit date: 08/26/2020 Discharge date: 08/30/2020  Admission Diagnoses:  Discharge Diagnoses:  Active Problems:   COPD with acute exacerbation (HCC)   CAD (coronary artery disease)   Hypertension   Type 2 diabetes mellitus without complication, with long-term current use of insulin (HCC)   Acute on chronic diastolic CHF (congestive heart failure) (HCC)   Acute kidney injury superimposed on CKD lllb (HCC)   Cardiac pacemaker   Elevated troponin   CHF (congestive heart failure) (HCC)   Malnutrition of moderate degree Does not meet criteria for acute kidney injury  Discharged Condition: good  Hospital Course:  Melanie King is a 75 y.o. female with medical history significant for Diastolic heart failure, CAD, pacemaker, CKD 3, HTN, diabetes, who presents to the emergency room with a 1 month history of shortness of breath with exertion and moving from room to room and a several day history of orthopnea that became acutely worse on the night of admission.  She denies chest pain though she states it feels like a fist in the middle of her chest.  Denies palpitations, nausea vomiting or diaphoresis.  Denies cough, fever or chills.  Has no lower extremity pain or swelling.  She has no abdominal pain or diarrhea. ED course: On arrival, afebrile, BP 173/58 with pulse of 76 respirations 22 with O2 sat in the low 90s on room air.  Blood work significant for first troponin of 168 and BNP 659.  Creatinine 1.79, up from baseline of 1.47.  WBC 13,000 with hemoglobin 9.2 down from a baseline of 11.5 three weeks prior. EKG, personally viewed and interpreted NSR at 75 with RBBB and LAFB and no acute ST-T wave changes Imaging: CXR with moderate cardiogenic failure  #1.  Acute on chronic diastolic congestive heart failure. Elevated troponin secondary to demand ischemia from congestive heart  failure. Coronary artery disease. Patient had evidence of congestive heart of exacerbation with orthopnea, short of breath, elevated BNP chest x-ray showed vascular congestion. He was giving IV Lasix 40 mg every 12 hours. Repeat echocardiogram showed ejection fraction 55 to 60%. Patient condition had improved today, renal function started worsening today.  Diuretics will be discontinued, will transition to oral torsemide 20 mg daily.  Patient can be followed with PCP and cardiology as outpatient.  #2.  COPD exacerbation. Patient no longer has any bronchospasm, glucose running high from steroids, will discontinue steroids.  Continue scheduled inhalers.  Follow-up with PCP as outpatient.  3.  Chronic kidney disease stage IIIb. Hyponatremia Patient does not meet criteria for acute kidney injury. Renal function slightly worsening today after diuretics.  Discontinue IV Lasix, resume oral diuretics.  Follow-up with PCP as outpatient.  4.  Type 2 diabetes uncontrolled with hyperglycemia Patient had increased glucose due to steroids.  I will discontinue steroids at this point.  Resume home medicines for diabetes.  5.  Essential hypertension.    Consults: cardiology  Significant Diagnostic Studies:   Echo: Left ventricular ejection fraction, by estimation, is 55 to 60%. The left ventricle has normal function. The left ventricle has no regional wall motion abnormalities. Left ventricular diastolic parameters were normal. 2. Right ventricular systolic function is normal. The right ventricular size is normal. 3. The mitral valve is normal in structure. Mild mitral valve regurgitation. No evidence of mitral stenosis. 4. The aortic valve is normal in structure. Aortic valve regurgitation is not visualized. Mild aortic valve stenosis.  5. The inferior vena cava is normal in size with greater than 50% respiratory variability, suggesting right atrial pressure of 3 mmHg.  Treatments: IV  lasix  Discharge Exam: Blood pressure (!) 164/58, pulse 69, temperature 97.8 F (36.6 C), temperature source Oral, resp. rate 16, height 5\' 1"  (1.549 m), weight 65.1 kg, SpO2 96 %. General appearance: alert and cooperative Resp: clear to auscultation bilaterally Cardio: regular rate and rhythm, S1, S2 normal, no murmur, click, rub or gallop GI: soft, non-tender; bowel sounds normal; no masses,  no organomegaly Extremities: extremities normal, atraumatic, no cyanosis or edema  Disposition: Discharge disposition: 01-Home or Self Care       Discharge Instructions     Diet - low sodium heart healthy   Complete by: As directed    Increase activity slowly   Complete by: As directed       Allergies as of 08/30/2020       Reactions   Alprazolam Swelling   Fentanyl Other (See Comments)   "burning and hot"   Meperidine Itching   Other reaction(s): Other (See Comments)   Meperidine Hcl    Ranitidine Hcl    Other reaction(s): Other (See Comments)        Medication List     STOP taking these medications    meloxicam 7.5 MG tablet Commonly known as: MOBIC       TAKE these medications    acetaminophen 500 MG tablet Commonly known as: TYLENOL Take 500 mg by mouth every 6 (six) hours as needed for mild pain or moderate pain.   Acidophilus/Pectin Caps Take 1 capsule by mouth in the morning and at bedtime.   amLODipine 10 MG tablet Commonly known as: NORVASC TAKE 1 TABLET BY MOUTH EVERY DAY   Aspirin Low Dose 81 MG chewable tablet Generic drug: aspirin Chew 81 mg by mouth daily.   benztropine 0.5 MG tablet Commonly known as: COGENTIN Take 0.5 mg by mouth 2 (two) times daily.   brexpiprazole 2 MG Tabs tablet Commonly known as: REXULTI Take 1 mg by mouth at bedtime.   calcitRIOL 0.25 MCG capsule Commonly known as: ROCALTROL Take 0.25 mcg by mouth daily.   carbidopa-levodopa 25-100 MG tablet Commonly known as: SINEMET IR Take 1 tablet by mouth 3 (three)  times daily.   cholecalciferol 25 MCG (1000 UNIT) tablet Commonly known as: VITAMIN D3 Take 400 Units by mouth daily.   citalopram 20 MG tablet Commonly known as: CELEXA Take 20 mg by mouth daily.   Durezol 0.05 % Emul Generic drug: Difluprednate Apply 1 drop to eye 2 (two) times daily.   fenofibrate 48 MG tablet Commonly known as: TRICOR Take 48 mg by mouth daily.   fluticasone 50 MCG/ACT nasal spray Commonly known as: FLONASE Place 2 sprays into both nostrils daily.   fluticasone furoate-vilanterol 200-25 MCG/INH Aepb Commonly known as: BREO ELLIPTA Inhale 1 puff into the lungs daily. Start taking on: August 31, 2020   gabapentin 100 MG capsule Commonly known as: NEURONTIN Take 100 mg by mouth at bedtime.   levothyroxine 150 MCG tablet Commonly known as: SYNTHROID Take 150 mcg by mouth daily.   losartan 50 MG tablet Commonly known as: COZAAR Take 50 mg by mouth daily.   magnesium oxide 400 MG tablet Commonly known as: MAG-OX Take 400 mg by mouth daily.   metFORMIN 1000 MG tablet Commonly known as: GLUCOPHAGE Take 0.5 tablets (500 mg total) by mouth 2 (two) times daily with a meal.   metoprolol  tartrate 25 MG tablet Commonly known as: LOPRESSOR Take 25 mg by mouth 2 (two) times daily.   montelukast 10 MG tablet Commonly known as: SINGULAIR Take 10 mg by mouth daily.   nitroGLYCERIN 0.2 mg/hr patch Commonly known as: NITRODUR - Dosed in mg/24 hr Place 0.2 mg onto the skin daily. leave patch on 12-14 hours, then remove for 10-12 hours prior to applying the next patch   pantoprazole 40 MG tablet Commonly known as: PROTONIX Take 40 mg by mouth daily.   primidone 50 MG tablet Commonly known as: MYSOLINE Take 50 mg by mouth 2 (two) times daily.   senna-docusate 8.6-50 MG tablet Commonly known as: Senokot-S Take 1 tablet by mouth 2 (two) times daily.   sitaGLIPtin 50 MG tablet Commonly known as: JANUVIA Take 50 mg by mouth daily.   torsemide 20 MG  tablet Commonly known as: Demadex Take 1 tablet (20 mg total) by mouth daily.   traZODone 50 MG tablet Commonly known as: DESYREL Take by mouth at bedtime as needed for sleep.   vitamin B-12 1000 MCG tablet Commonly known as: CYANOCOBALAMIN Take 2,000 mcg by mouth daily.        Follow-up Information     Marguarite Arbour, MD Follow up in 1 week(s).   Specialty: Internal Medicine Contact information: [address] [facility] [place] [address] [phone]         Marcina Millard, MD Follow up in 2 week(s).   Specialty: Cardiology Contact information: [address] [facility] [place] [address] [phone]                35 minutes Signed: Marrion Coy 08/30/2020, 10:14 AM

## 2020-08-30 NOTE — [place] FL2 (Signed)
[place] MEDICAID FL2 LEVEL OF CARE SCREENING TOOL     IDENTIFICATION  Patient Name: Melanie King Birthdate: 06/08/1945 Sex: female Admission Date (Current Location): 08/26/2020  [facility] and [place] Number:  Chiropodist and Address:  [facility], [address] [place], [place] [address]      Provider Number: [id number]  Attending Physician Name and Address:  Marrion Coy, MD  Relative Name and Phone Number:       Current Level of Care: Hospital Recommended Level of Care: Assisted Living Facility Prior Approval Number:    Date Approved/Denied:   PASRR Number:    Discharge Plan: Other (Comment) (ALF)    Current Diagnoses: Patient Active Problem List   Diagnosis Date Noted   Malnutrition of moderate degree 08/28/2020   Elevated troponin 08/26/2020   CHF (congestive heart failure) (HCC) 08/26/2020   Carotid stenosis 04/12/2020   Dysphagia 07/19/2019   Acute respiratory failure due to COVID-19 (HCC) 07/16/2019   Respiratory distress    Acute on chronic congestive heart failure (HCC)    Acute respiratory failure with hypoxia (HCC) 07/15/2019   Frequent falls 05/26/2019   Generalized weakness 05/26/2019   Acute kidney injury superimposed on CKD lllb (HCC) 03/21/2019   Subdural hematoma (HCC) 03/20/2019   Cardiac pacemaker 03/17/2019   Hypothyroidism 03/10/2019   Depression    Acute on chronic diastolic CHF (congestive heart failure) (HCC)    Acute metabolic encephalopathy    Fall    Tobacco abuse    C6 cervical fracture (HCC)    Left hand pain 04/01/2018   Numbness and tingling in left hand 04/01/2018   Left hand weakness 03/05/2018   Confusion 05/12/2017   Seizure (HCC) 05/10/2017   Elevated sedimentation rate 04/04/2017   Elevated C-reactive protein (CRP) 04/04/2017   Chest pain, unspecified 04/02/2017   Bilateral lower extremity pain (Secondary Area of Pain) (R>L) 04/02/2017   Chronic hip pain, bilateral  (Secondary  Area of Pain) (R>L) 04/02/2017   Chronic bilateral low back pain with bilateral sciatica [facility] Area of Pain) (R>L) 04/02/2017   Chronic pain syndrome 04/02/2017   Disorder of bone, unspecified 04/02/2017   Other long term (current) drug therapy 04/02/2017   Other specified health status 04/02/2017   Long term current use of opiate analgesic 04/02/2017   Hip pain, bilateral 12/06/2016   Tremor 05/27/2016   Trigger finger of left hand 02/16/2016   Osteoarthritis of both hands 02/16/2016   Angina pectoris (HCC) 01/17/2016   COPD with acute exacerbation (HCC) 01/17/2016   CAD (coronary artery disease) 01/17/2016   HLD (hyperlipidemia) 01/17/2016   Hypertension 01/17/2016   Sick sinus syndrome (HCC) 01/17/2016   Chronic pain 01/17/2016   Personal history of tobacco use, presenting hazards to health 11/16/2015   Type 2 diabetes mellitus without complication, with long-term current use of insulin (HCC) 05/12/2013    Orientation RESPIRATION BLADDER Height & Weight     Self  O2 ( 2L) Incontinent Weight: 143 lb 8.3 oz (65.1 kg) Height:  5\' 1"  (154.9 cm)  BEHAVIORAL SYMPTOMS/MOOD NEUROLOGICAL BOWEL NUTRITION STATUS      Continent Diet (heart healthy, carb modified, thin liquids)  AMBULATORY STATUS COMMUNICATION OF NEEDS Skin   Limited Assist Verbally Skin abrasions (Closed incision on right eye)                       Personal Care Assistance Level of Assistance  Bathing, Feeding, Dressing Bathing Assistance: Limited assistance Feeding assistance:  Independent Dressing Assistance: Limited assistance     Functional Limitations Info  Sight, Hearing, Speech Sight Info: Adequate Hearing Info: Adequate Speech Info: Adequate    SPECIAL CARE FACTORS FREQUENCY  PT (By licensed PT), OT (By licensed OT)     PT Frequency: 2x OT Frequency: 2x            Contractures Contractures Info: Not present    Additional Factors Info  Code Status, Allergies Code Status Info: full  code Allergies Info: Alprazolam, Fentanyl, Meperidine, Meperidine Hcl, Ranitidine Hcl           Current Medications (08/30/2020):  acetaminophen 500 MG tablet Commonly known as: TYLENOL Take 500 mg by mouth every 6 (six) hours as needed for mild pain or moderate pain.    Acidophilus/Pectin Caps Take 1 capsule by mouth in the morning and at bedtime.    amLODipine 10 MG tablet Commonly known as: NORVASC TAKE 1 TABLET BY MOUTH EVERY DAY    Aspirin Low Dose 81 MG chewable tablet Generic drug: aspirin Chew 81 mg by mouth daily.    benztropine 0.5 MG tablet Commonly known as: COGENTIN Take 0.5 mg by mouth 2 (two) times daily.    brexpiprazole 2 MG Tabs tablet Commonly known as: REXULTI Take 1 mg by mouth at bedtime.    calcitRIOL 0.25 MCG capsule Commonly known as: ROCALTROL Take 0.25 mcg by mouth daily.    carbidopa-levodopa 25-100 MG tablet Commonly known as: SINEMET IR Take 1 tablet by mouth 3 (three) times daily.    cholecalciferol 25 MCG (1000 UNIT) tablet Commonly known as: VITAMIN D3 Take 400 Units by mouth daily.    citalopram 20 MG tablet Commonly known as: CELEXA Take 20 mg by mouth daily.    Durezol 0.05 % Emul Generic drug: Difluprednate Apply 1 drop to eye 2 (two) times daily.    fenofibrate 48 MG tablet Commonly known as: TRICOR Take 48 mg by mouth daily.    fluticasone 50 MCG/ACT nasal spray Commonly known as: FLONASE Place 2 sprays into both nostrils daily.    fluticasone furoate-vilanterol 200-25 MCG/INH Aepb Commonly known as: BREO ELLIPTA Inhale 1 puff into the lungs daily. Start taking on: August 31, 2020    gabapentin 100 MG capsule Commonly known as: NEURONTIN Take 100 mg by mouth at bedtime.    levothyroxine 150 MCG tablet Commonly known as: SYNTHROID Take 150 mcg by mouth daily.    losartan 50 MG tablet Commonly known as: COZAAR Take 50 mg by mouth daily.    magnesium oxide 400 MG tablet Commonly known as: MAG-OX Take 400 mg  by mouth daily.    metFORMIN 1000 MG tablet Commonly known as: GLUCOPHAGE Take 0.5 tablets (500 mg total) by mouth 2 (two) times daily with a meal.    metoprolol tartrate 25 MG tablet Commonly known as: LOPRESSOR Take 25 mg by mouth 2 (two) times daily.    montelukast 10 MG tablet Commonly known as: SINGULAIR Take 10 mg by mouth daily.    nitroGLYCERIN 0.2 mg/hr patch Commonly known as: NITRODUR - Dosed in mg/24 hr Place 0.2 mg onto the skin daily. leave patch on 12-14 hours, then remove for 10-12 hours prior to applying the next patch    pantoprazole 40 MG tablet Commonly known as: PROTONIX Take 40 mg by mouth daily.    primidone 50 MG tablet Commonly known as: MYSOLINE Take 50 mg by mouth 2 (two) times daily.    senna-docusate 8.6-50 MG tablet Commonly known as:  Senokot-S Take 1 tablet by mouth 2 (two) times daily.    sitaGLIPtin 50 MG tablet Commonly known as: JANUVIA Take 50 mg by mouth daily.    torsemide 20 MG tablet Commonly known as: Demadex Take 1 tablet (20 mg total) by mouth daily.    traZODone 50 MG tablet Commonly known as: DESYREL Take by mouth at bedtime as needed for sleep.    vitamin B-12 1000 MCG tablet Commonly known as: CYANOCOBALAMIN Take 2,000 mcg by mouth daily.      Discharge Medications: Please see discharge summary for a list of discharge medications.  Relevant Imaging Results:  Relevant Lab Results:   Additional Information SSN:[id number]  Reuel Boom Ethelene Closser, LCSW

## 2020-08-30 NOTE — Care Management Important Message (Addendum)
Important Message  Patient Details  Name: Melanie King MRN: [id number] Date of Birth: 1945/03/11   Medicare Important Message Given:  Yes    Johnell Comings 08/30/2020, 3:02 PM

## 2020-08-30 NOTE — Progress Notes (Signed)
Inpatient Diabetes Program Recommendations  AACE/ADA: New Consensus Statement on Inpatient Glycemic Control   Target Ranges:  Prepandial:   less than 140 mg/dL      Peak postprandial:   less than 180 mg/dL (1-2 hours)      Critically ill patients:  140 - 180 mg/dL   Results for [place], [place] F (MRN [id number]) as of 08/30/2020 09:34  Ref. Range 08/29/2020 07:54 08/29/2020 08:55 08/29/2020 11:31 08/29/2020 16:38 08/29/2020 21:08 08/29/2020 22:43 08/30/2020 07:42  Glucose-Capillary Latest Ref Range: 70 - 99 mg/dL 784 (H) 696 (H) 295 (H) 278 (H) 265 (H) 233 (H) 400 (H)   Review of Glycemic Control  Diabetes history: DM2 Outpatient Diabetes medications: Metformin 500 mg BID, Januvia 50 mg daily Current orders for Inpatient glycemic control: Novolog 0-15 units TID with meals, Novolog 0-5 units QHS  Inpatient Diabetes Program Recommendations:    Insulin: Noted Solumedrol was discontinued and patient last received Solumedrol 40 mg at 22:56 on 08/29/20. Glucose 400 mg/dl this morning. May want to consider ordering one time Lantus 10 units x1 now.  Thanks, Orlando Penner, RN, MSN, CDE Diabetes Coordinator Inpatient Diabetes Program [phone] (Team Pager from 8am to 5pm)

## 2020-09-21 NOTE — Progress Notes (Signed)
Patient ID: Melanie King, female    DOB: [birth year], 75 y.o.   MRN: [id number]  HPI  Melanie King is a 75 y/o female with a history of CAD, DM, hyperlipidemia, HTN, GERD, depression, pacemaker, COPD, current tobacco use and chronic heart failure.   Echo report from 08/27/20 reviewed and showed an EF of 55-60% without structural changes. Echo report from 07/15/19 reviewed and showed an EF of 60-65% along with moderate LVH, mild LAE and trace MR.   Admitted 08/26/20 due to shortness of breath due to HF exacerbation. Cardiology consult obtained. Initially given IV lasix with transition to oral diuretics. Elevated troponin thought to be due to demand ischemia. Renal function had worsened with IV lasix. Hyperglycemia due to steroid use. Discharged after 4 days.   She presents today for a follow-up visit with a chief complaint of moderate fatigue upon minimal exertion. She describes this as chronic in nature having been present for several years. She has associated shortness of breath, anxiety and dizziness along with this. She denies any difficulty sleeping, abdominal distention, palpitations, pedal edema, chest pain, cough or weight gain.   She continues to receive PT at the facility.   Past Medical History:  Diagnosis Date   Arthritis    CHF (congestive heart failure) (HCC)    COPD (chronic obstructive pulmonary disease) (HCC)    Coronary arteriosclerosis    DDD (degenerative disc disease), cervical    DDD (degenerative disc disease), lumbar    Depression    Diabetes mellitus without complication (HCC)    GERD (gastroesophageal reflux disease)    Hyperlipidemia    Hypertension    Osteoarthritis    Pacemaker    Tremor    Past Surgical History:  Procedure Laterality Date   ABDOMINAL HYSTERECTOMY     BREAST BIOPSY Right 1994   neg cyst removed   CATARACT EXTRACTION W/PHACO Right 08/17/2020   Procedure: CATARACT EXTRACTION PHACO AND INTRAOCULAR LENS PLACEMENT (IOC) RIGHT DIABETIC 8.90  00:52.9;  Surgeon: Galen Manila, MD;  Location: [facility];  Service: Ophthalmology;  Laterality: Right;  Diabetic   CHOLECYSTECTOMY     EYE SURGERY  1964, 1966, and 1967   bilateral   FOOT SURGERY Right    cellulitis   PACEMAKER INSERTION     PPM GENERATOR CHANGEOUT N/A 02/24/2020   Procedure: PPM GENERATOR CHANGEOUT;  Surgeon: Marcina Millard, MD;  Location: [facility];  Service: Cardiovascular;  Laterality: N/A;   SPINE SURGERY     cyst removed; [facility]   Family History  Problem Relation Age of Onset   Breast cancer Maternal Aunt 19   Cancer Mother        colon   Aneurysm Father    Social History   Tobacco Use   Smoking status: Every Day    Packs/day: 1.00    Years: 50.00    Pack years: 50.00    Types: Cigarettes   Smokeless tobacco: Never  Substance Use Topics   Alcohol use: No   Allergies  Allergen Reactions   Alprazolam Swelling   Fentanyl Other (See Comments)    "burning and hot"   Meperidine Itching    Other reaction(s): Other (See Comments)   Meperidine Hcl    Ranitidine Hcl     Other reaction(s): Other (See Comments)   Prior to Admission medications   Medication Sig Start Date End Date Taking? Authorizing Provider  acetaminophen (TYLENOL) 500 MG tablet Take 500 mg by mouth every 6 (six) hours  as needed for mild pain or moderate pain.   Yes [provider]  amLODipine (NORVASC) 10 MG tablet TAKE 1 TABLET BY MOUTH EVERY DAY 08/28/16  Yes Gabriel Cirri, NP  ASPIRIN LOW DOSE 81 MG chewable tablet Chew 81 mg by mouth daily. 07/27/20  Yes [provider]  benztropine (COGENTIN) 0.5 MG tablet Take 0.5 mg by mouth 2 (two) times daily.   Yes [provider]  brexpiprazole (REXULTI) 2 MG TABS tablet Take 1 mg by mouth at bedtime.   Yes [provider]  calcitRIOL (ROCALTROL) 0.25 MCG capsule Take 0.25 mcg by mouth daily.   Yes [provider]  carbidopa-levodopa (SINEMET IR) 25-100 MG  tablet Take 1 tablet by mouth 3 (three) times daily. 07/27/20  Yes [provider]  cholecalciferol (VITAMIN D3) 25 MCG (1000 UNIT) tablet Take 400 Units by mouth daily.    Yes [provider]  citalopram (CELEXA) 20 MG tablet Take 20 mg by mouth daily.   Yes [provider]  DUREZOL 0.05 % EMUL Apply 1 drop to eye 2 (two) times daily. 07/28/20  Yes [provider]  fenofibrate (TRICOR) 48 MG tablet Take 48 mg by mouth daily.   Yes [provider]  fluticasone (FLONASE) 50 MCG/ACT nasal spray Place 2 sprays into both nostrils daily.   Yes [provider]  fluticasone furoate-vilanterol (BREO ELLIPTA) 200-25 MCG/INH AEPB Inhale 1 puff into the lungs daily. 08/31/20  Yes Marrion Coy, MD  gabapentin (NEURONTIN) 100 MG capsule Take 100 mg by mouth at bedtime.   Yes [provider]  Lactobacillus Acid-Pectin (ACIDOPHILUS/PECTIN) CAPS Take 1 capsule by mouth in the morning and at bedtime. 07/27/20  Yes [provider]  levothyroxine (SYNTHROID) 150 MCG tablet Take 150 mcg by mouth daily. 07/27/20  Yes [provider]  losartan (COZAAR) 50 MG tablet Take 50 mg by mouth daily.   Yes [provider]  magnesium oxide (MAG-OX) 400 MG tablet Take 400 mg by mouth daily.   Yes [provider]  metFORMIN (GLUCOPHAGE) 1000 MG tablet Take 0.5 tablets (500 mg total) by mouth 2 (two) times daily with a meal. 05/28/19  Yes Dhungel, Nishant, MD  metoprolol tartrate (LOPRESSOR) 25 MG tablet Take 25 mg by mouth 2 (two) times daily.   Yes [provider]  montelukast (SINGULAIR) 10 MG tablet Take 10 mg by mouth daily.   Yes [provider]  nitroGLYCERIN (NITRODUR - DOSED IN MG/24 HR) 0.2 mg/hr patch Place 0.2 mg onto the skin daily. leave patch on 12-14 hours, then remove for 10-12 hours prior to applying the next patch   Yes [provider]  pantoprazole (PROTONIX) 40 MG tablet Take 40 mg by mouth  daily.   Yes [provider]  primidone (MYSOLINE) 50 MG tablet Take 50 mg by mouth 2 (two) times daily.   Yes [provider]  senna-docusate (SENOKOT-S) 8.6-50 MG tablet Take 1 tablet by mouth 2 (two) times daily.   Yes [provider]  sitaGLIPtin (JANUVIA) 50 MG tablet Take 50 mg by mouth daily.   Yes [provider]  torsemide (DEMADEX) 20 MG tablet Take 1 tablet (20 mg total) by mouth daily. 08/30/20  Yes Marrion Coy, MD  traZODone (DESYREL) 50 MG tablet Take by mouth at bedtime as needed for sleep.   Yes [provider]  vitamin B-12 (CYANOCOBALAMIN) 1000 MCG tablet Take 2,000 mcg by mouth daily.   Yes [provider]  Review of Systems  Constitutional:  Positive for fatigue (easily). Negative for appetite change.  HENT:  Positive for hearing loss. Negative for congestion and postnasal drip.   Eyes: Negative.   Respiratory:  Positive for shortness of breath. Negative for cough and chest tightness.   Cardiovascular:  Negative for chest pain, palpitations and leg swelling.  Gastrointestinal:  Negative for abdominal distention and abdominal pain.  Endocrine: Negative.   Genitourinary: Negative.   Musculoskeletal:  Positive for back pain. Negative for neck pain.  Skin: Negative.   Allergic/Immunologic: Negative.   Neurological:  Positive for dizziness. Negative for light-headedness.  Hematological:  Negative for adenopathy. Does not bruise/bleed easily.  Psychiatric/Behavioral:  Positive for dysphoric mood. Negative for sleep disturbance (sleeping on 1 pillow). The patient is nervous/anxious.    Vitals:   09/22/20 1213 09/22/20 1244  BP: (!) 171/60 (!) 150/60  Pulse: 70   Resp: 18   SpO2: 100%   Weight: 148 lb 8 oz (67.4 kg)   Height: 5\' 1"  (1.549 m)    Wt Readings from Last 3 Encounters:  09/22/20 148 lb 8 oz (67.4 kg)  08/30/20 143 lb 8.3 oz (65.1 kg)  08/17/20 148 lb (67.1 kg)   Lab Results  Component Value Date    CREATININE 1.80 (H) 08/30/2020   CREATININE 1.45 (H) 08/29/2020   CREATININE 1.51 (H) 08/28/2020   Physical Exam Vitals and nursing note reviewed.  Constitutional:      Appearance: She is well-developed.  HENT:     Head: Normocephalic and atraumatic.     Right Ear: Decreased hearing noted.     Left Ear: Decreased hearing noted.  Cardiovascular:     Rate and Rhythm: Normal rate and regular rhythm.  Pulmonary:     Effort: Pulmonary effort is normal. No respiratory distress.     Breath sounds: No wheezing or rales.  Abdominal:     General: Abdomen is flat. There is no distension.     Palpations: Abdomen is soft.  Musculoskeletal:        General: No tenderness. Normal range of motion.     Cervical back: Normal range of motion and neck supple.     Right lower leg: No edema.     Left lower leg: No edema.  Skin:    General: Skin is warm and dry.  Neurological:     General: No focal deficit present.     Mental Status: She is alert and oriented to person, place, and time.  Psychiatric:        Mood and Affect: Mood normal.        Behavior: Behavior normal.        Thought Content: Thought content normal.   Assessment & Plan:  1: Chronic heart failure with preserved ejection fraction without structural changes- - NYHA class III - euvolemic today - getting weighed weekly; reminded to call for an overnight weight gain of >2 pounds or a weekly weight gain of >5 pounds - weight up 4 pounds from last visit here 11 months ago - not adding salt and is currently on a heart healthy diet at the facility - saw cardiology 08/30/2020) 07/15/20 - says that she still receives physical therapy - BNP 08/26/20 was 659.2  2: HTN- - BP elevated today (171/60) initially but improved slightly to 150/60 - saw PCP ([facility]) 05/06/20 - BMP 08/30/20 reviewed and showed sodium 131, potassium 4.2, creatinine 1.8 and GFR 29  3: DM- - saw endocrinology 09/01/20) 06/04/19 - A1c 08/27/20  was 7.8%  4: Tobacco  use- - smoking 1 ppd of cigarettes - continued cessation discussed for 3 minutes with patient    Facility medication list was reviewed.   Return in 6 months or sooner for any questions/problems before then.

## 2020-09-22 ENCOUNTER — Encounter: Payer: Self-pay | Admitting: Family

## 2020-09-22 ENCOUNTER — Ambulatory Visit: Payer: Medicare Other | Attending: Family | Admitting: Family

## 2020-09-22 ENCOUNTER — Other Ambulatory Visit: Payer: Self-pay

## 2020-09-22 VITALS — BP 150/60 | HR 70 | Resp 18 | Ht 61.0 in | Wt 148.5 lb

## 2020-09-22 DIAGNOSIS — I110 Hypertensive heart disease with heart failure: Secondary | ICD-10-CM | POA: Insufficient documentation

## 2020-09-22 DIAGNOSIS — K219 Gastro-esophageal reflux disease without esophagitis: Secondary | ICD-10-CM | POA: Insufficient documentation

## 2020-09-22 DIAGNOSIS — Z7984 Long term (current) use of oral hypoglycemic drugs: Secondary | ICD-10-CM | POA: Diagnosis not present

## 2020-09-22 DIAGNOSIS — E1122 Type 2 diabetes mellitus with diabetic chronic kidney disease: Secondary | ICD-10-CM

## 2020-09-22 DIAGNOSIS — E1165 Type 2 diabetes mellitus with hyperglycemia: Secondary | ICD-10-CM | POA: Insufficient documentation

## 2020-09-22 DIAGNOSIS — Z79890 Hormone replacement therapy (postmenopausal): Secondary | ICD-10-CM | POA: Diagnosis not present

## 2020-09-22 DIAGNOSIS — E785 Hyperlipidemia, unspecified: Secondary | ICD-10-CM | POA: Diagnosis not present

## 2020-09-22 DIAGNOSIS — Z885 Allergy status to narcotic agent status: Secondary | ICD-10-CM | POA: Insufficient documentation

## 2020-09-22 DIAGNOSIS — Z79899 Other long term (current) drug therapy: Secondary | ICD-10-CM | POA: Diagnosis not present

## 2020-09-22 DIAGNOSIS — Z950 Presence of cardiac pacemaker: Secondary | ICD-10-CM | POA: Insufficient documentation

## 2020-09-22 DIAGNOSIS — Z720 Tobacco use: Secondary | ICD-10-CM

## 2020-09-22 DIAGNOSIS — Z888 Allergy status to other drugs, medicaments and biological substances status: Secondary | ICD-10-CM | POA: Diagnosis not present

## 2020-09-22 DIAGNOSIS — F17210 Nicotine dependence, cigarettes, uncomplicated: Secondary | ICD-10-CM | POA: Diagnosis not present

## 2020-09-22 DIAGNOSIS — I10 Essential (primary) hypertension: Secondary | ICD-10-CM

## 2020-09-22 DIAGNOSIS — I5032 Chronic diastolic (congestive) heart failure: Secondary | ICD-10-CM | POA: Insufficient documentation

## 2020-09-22 DIAGNOSIS — Z794 Long term (current) use of insulin: Secondary | ICD-10-CM

## 2020-09-22 DIAGNOSIS — I2510 Atherosclerotic heart disease of native coronary artery without angina pectoris: Secondary | ICD-10-CM | POA: Diagnosis not present

## 2020-09-22 DIAGNOSIS — N1832 Chronic kidney disease, stage 3b: Secondary | ICD-10-CM

## 2020-09-22 NOTE — Patient Instructions (Signed)
Continue weighing daily and call for an overnight weight gain of > 2 pounds or a weekly weight gain of >5 pounds. 

## 2020-10-01 IMAGING — CT CT HEAD W/O CM
2 series · 15 of 40 positions shown, 18 images · non-contrast
Comparison: Head CT dated 09/23/2019.

CLINICAL DATA: 74-year-old female with trauma.

EXAM:
CT HEAD WITHOUT CONTRAST
CT CERVICAL SPINE WITHOUT CONTRAST
TECHNIQUE: Multidetector CT imaging of the head and cervical spine was
performed following the standard protocol without intravenous
contrast. Multiplanar CT image reconstructions of the cervical spine
were also generated.

[Series 3: head wo · axial · 0.43mm/px · z∈[-148,-18]mm · 12 of 32 slices shown, 15 images]
[im 3/32  brain]
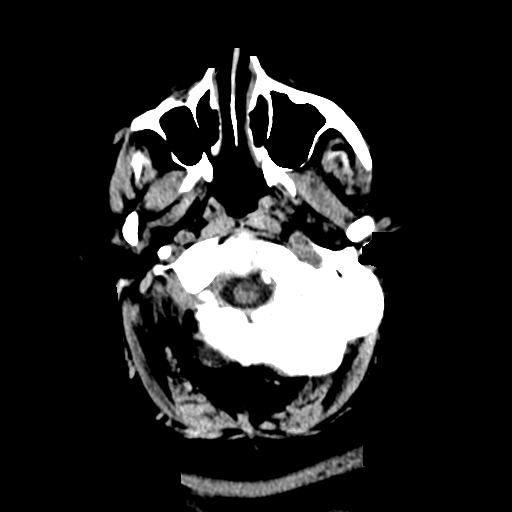
[im 3/32  bone]
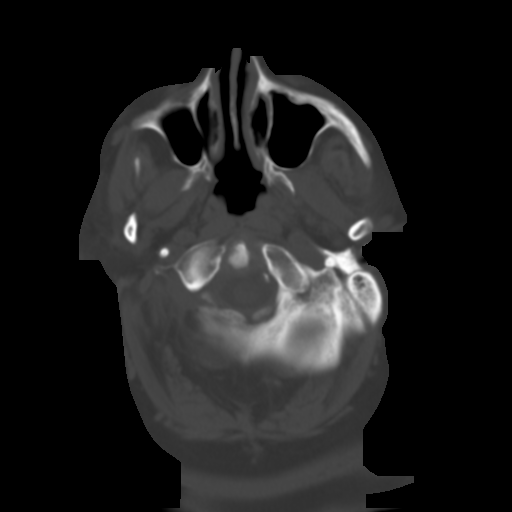
[im 5/32  brain]
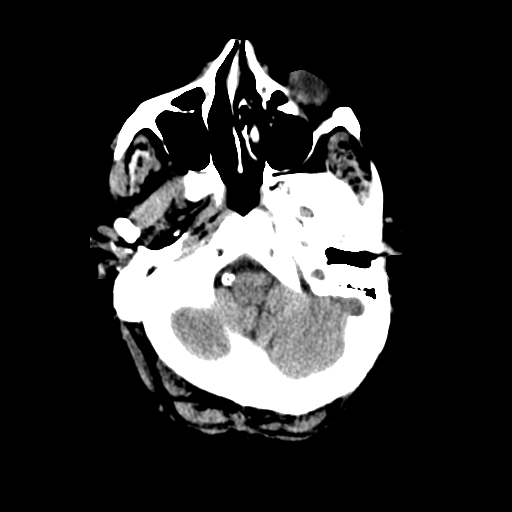
[im 7/32  brain]
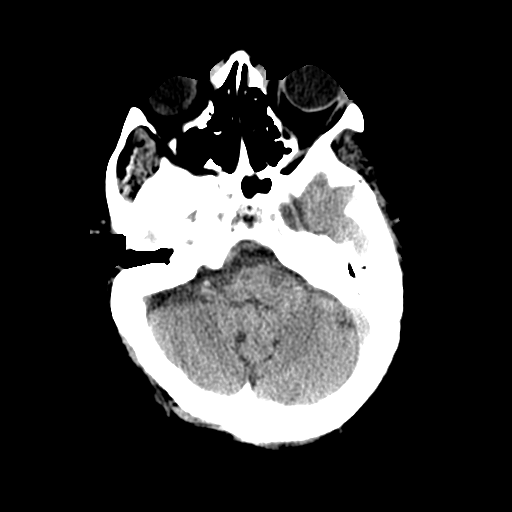
[im 10/32  brain]
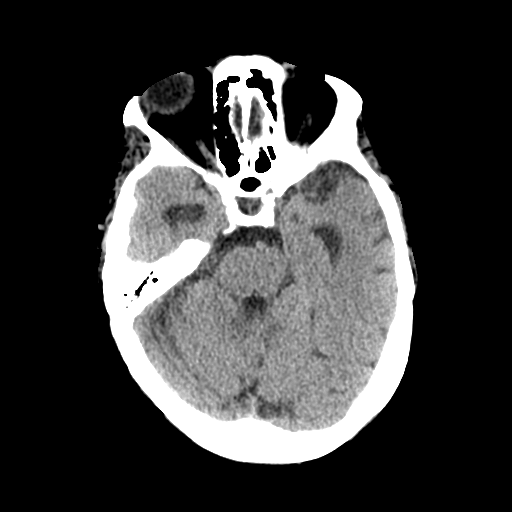
[im 12/32  brain]
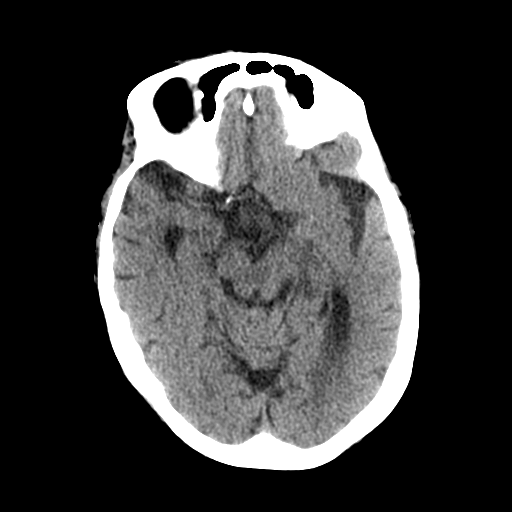
[im 12/32  bone]
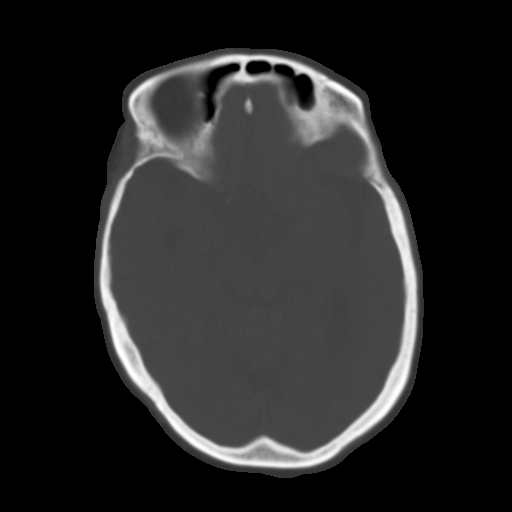
[im 14/32  brain]
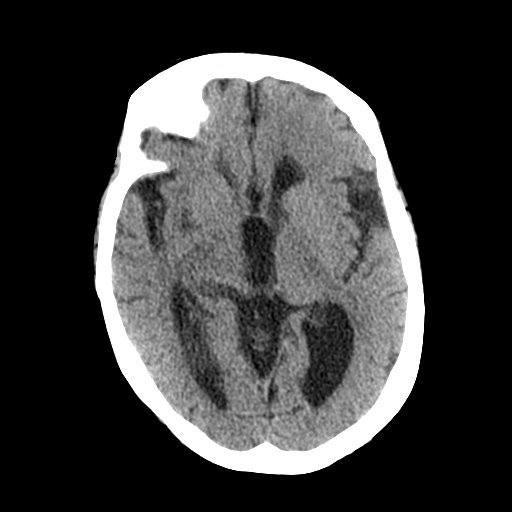
[im 18/32  brain]
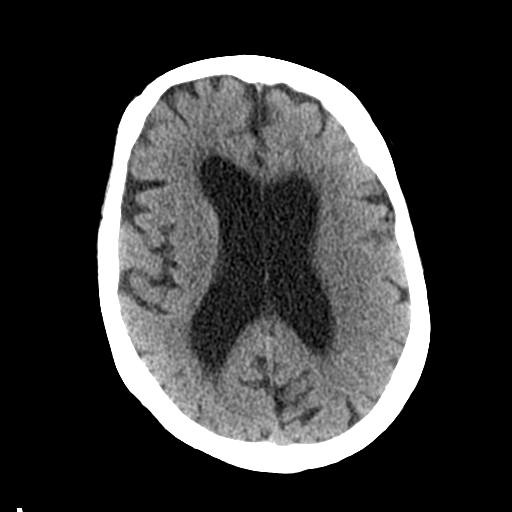
[im 20/32  brain]
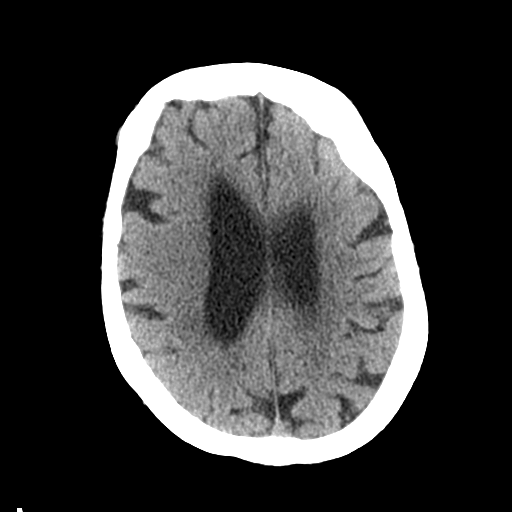
[im 22/32  brain]
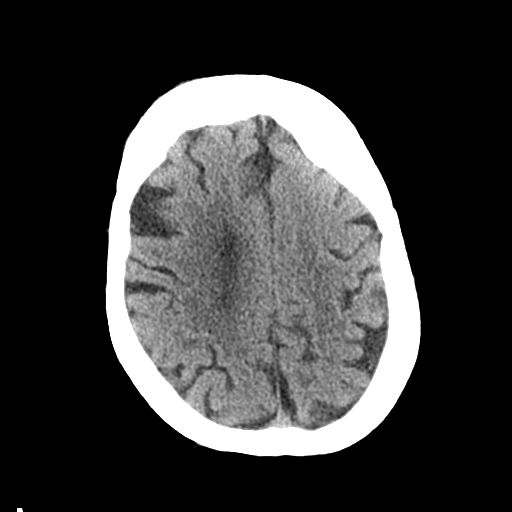
[im 22/32  bone]
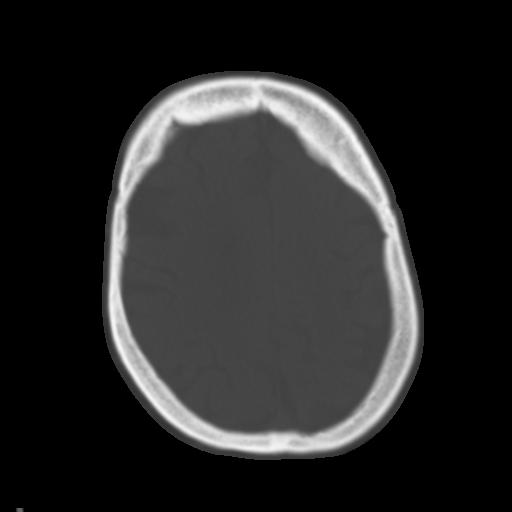
[im 25/32  brain]
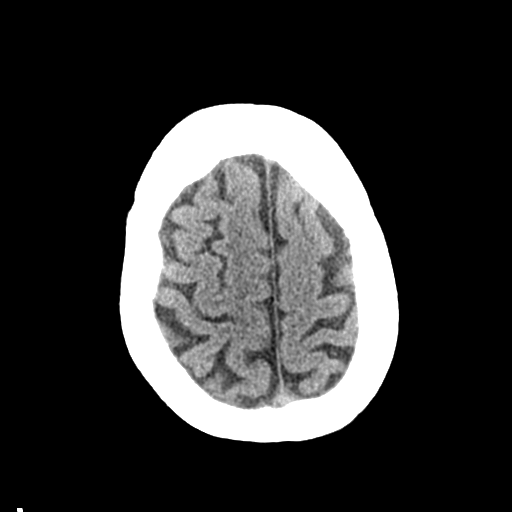
[im 27/32  brain]
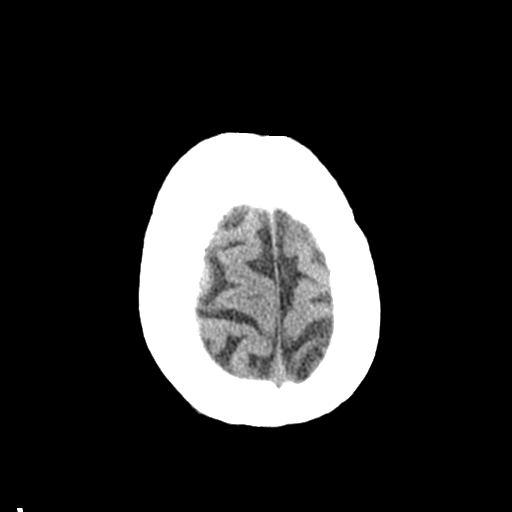
[im 29/32  brain]
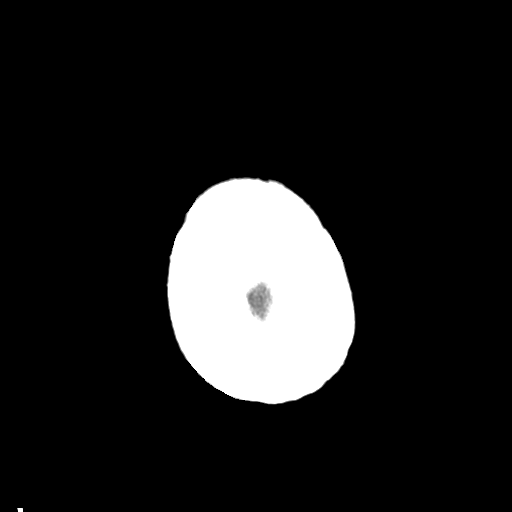

[Series 4: coronal soft tissue · coronal · 0.33mm/px · 3 of 72 slices shown]
[im 24/72  brain]
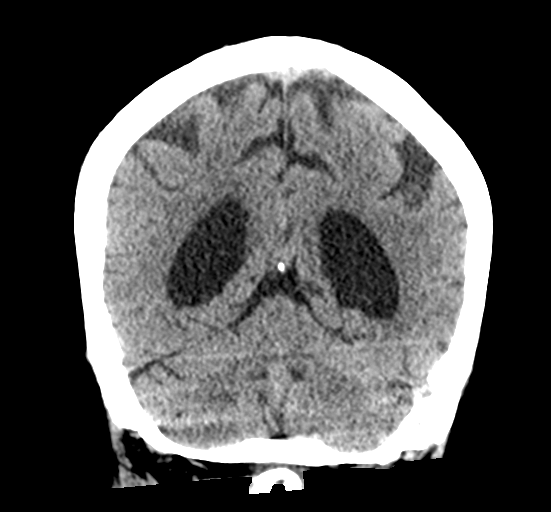
[im 32/72  brain]
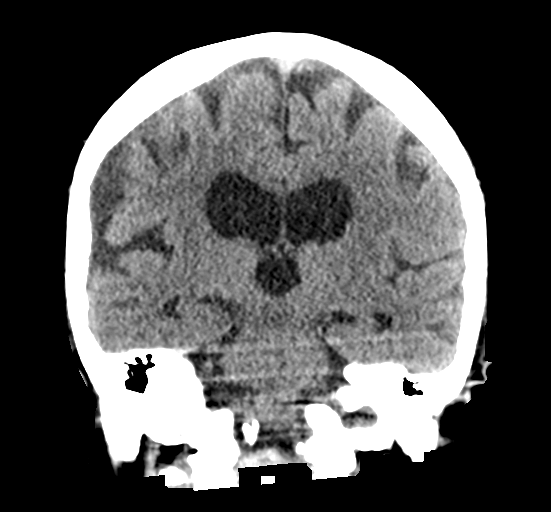
[im 40/72  brain]
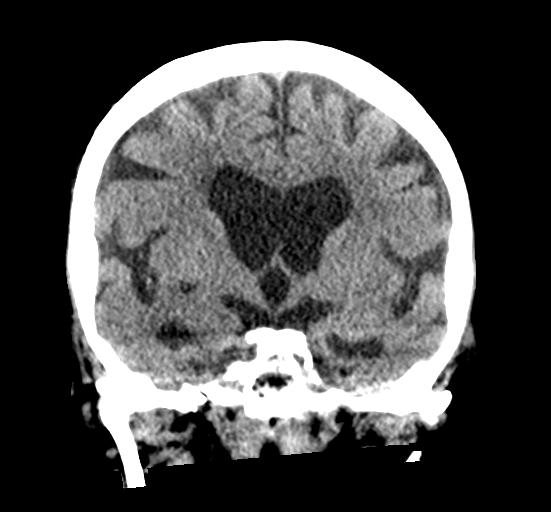

[15 of 40 positions shown; findings below may reference images not displayed]

FINDINGS: CT HEAD FINDINGS

Brain: There is mild age-related atrophy and chronic microvascular
ischemic changes. Subcentimeter bilateral basal ganglia old lacunar
infarcts noted. There is no acute intracranial hemorrhage. No mass
effect midline shift. No extra-axial fluid collection.

Vascular: No hyperdense vessel or unexpected calcification.

Skull: Normal. Negative for fracture or focal lesion.

Sinuses/Orbits: The visualized paranasal sinuses and mastoid air
cells are clear.

Other: None

CT CERVICAL SPINE FINDINGS

Alignment: No acute subluxation. There is mild reversal of normal
cervical lordosis which may be positional or due to muscle spasm

Skull base and vertebrae: No acute fracture

Soft tissues and spinal canal: No prevertebral fluid or swelling. No
visible canal hematoma.

Disc levels: Multilevel degenerative changes with multilevel facet
arthropathy.

Upper chest: There is diffuse interstitial and interlobular septal
prominence, possibly edema.

Other: A pacemaker device is partially visualized. There are
bilateral carotid bulb calcified plaques.
IMPRESSION: 1. No acute intracranial pathology. Mild age-related atrophy and
chronic microvascular ischemic changes.
2. No acute/traumatic cervical spine pathology. Multilevel
degenerative changes.

## 2020-10-01 IMAGING — CT CT CERVICAL SPINE W/O CM
2 series · 9 of 28 positions shown, 11 images · non-contrast
Comparison: Head CT dated 09/23/2019.

CLINICAL DATA: 74-year-old female with trauma.

EXAM:
CT HEAD WITHOUT CONTRAST
CT CERVICAL SPINE WITHOUT CONTRAST
TECHNIQUE: Multidetector CT imaging of the head and cervical spine was
performed following the standard protocol without intravenous
contrast. Multiplanar CT image reconstructions of the cervical spine
were also generated.

[Series 3: c spine soft · axial · 0.38mm/px · z∈[-268,-158]mm · 4 of 81 slices shown, 5 images]
[im 13/81  soft-tissue]
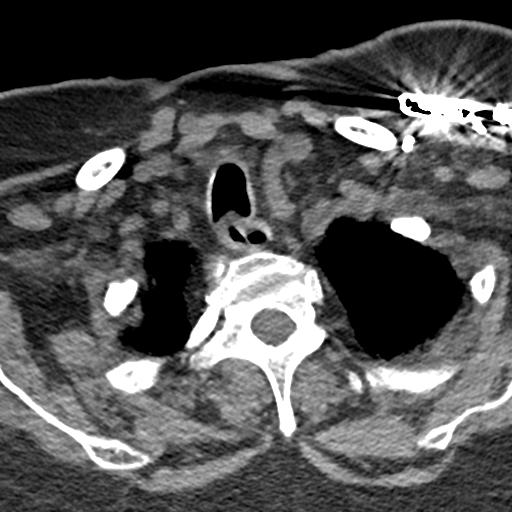
[im 13/81  bone]
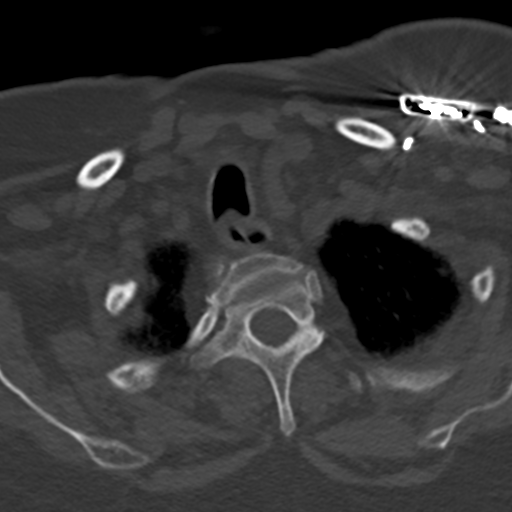
[im 31/81  bone]
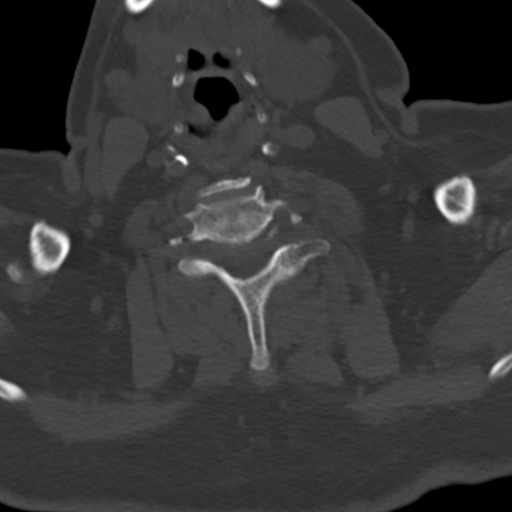
[im 50/81  bone]
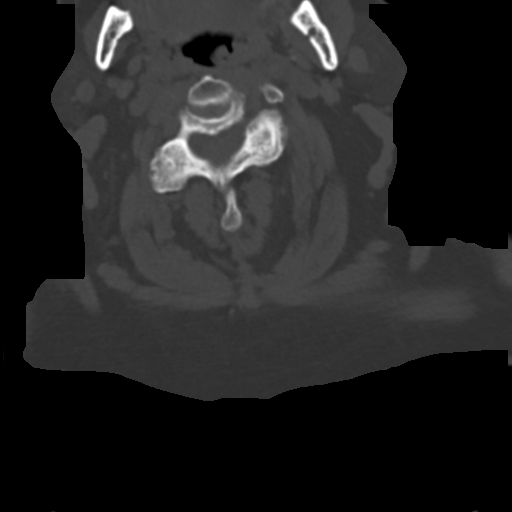
[im 68/81  bone]
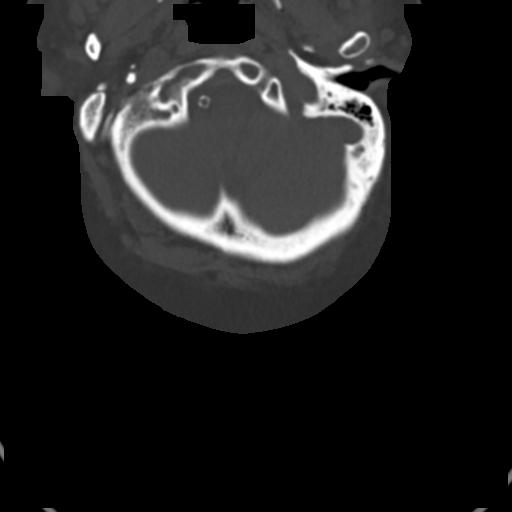

[Series 6: sagittal bone · sagittal · 0.19mm/px · 5 of 55 slices shown, 6 images]
[im 19/55  bone]
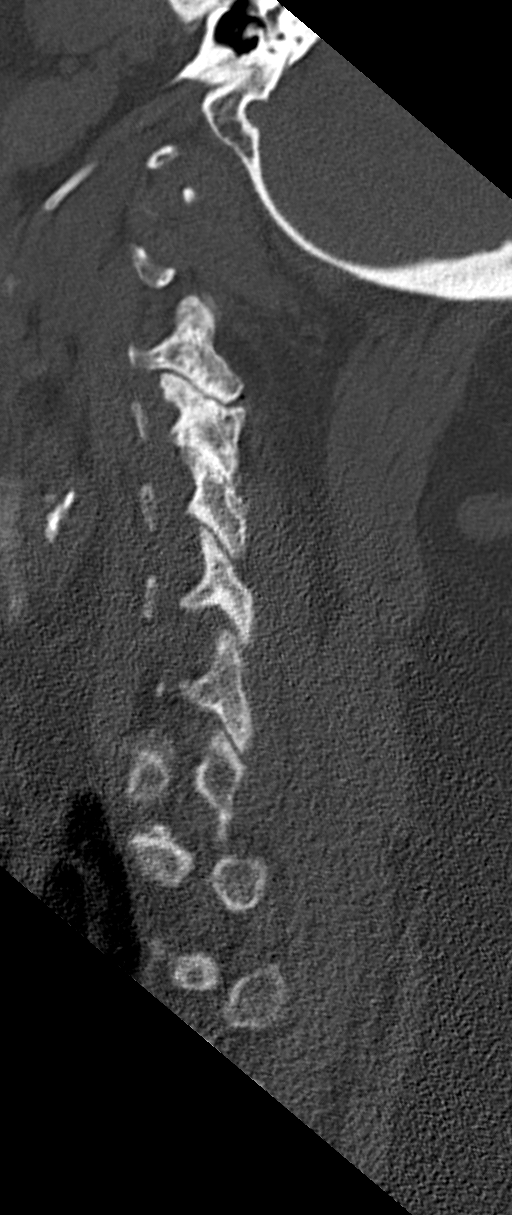
[im 23/55  bone]
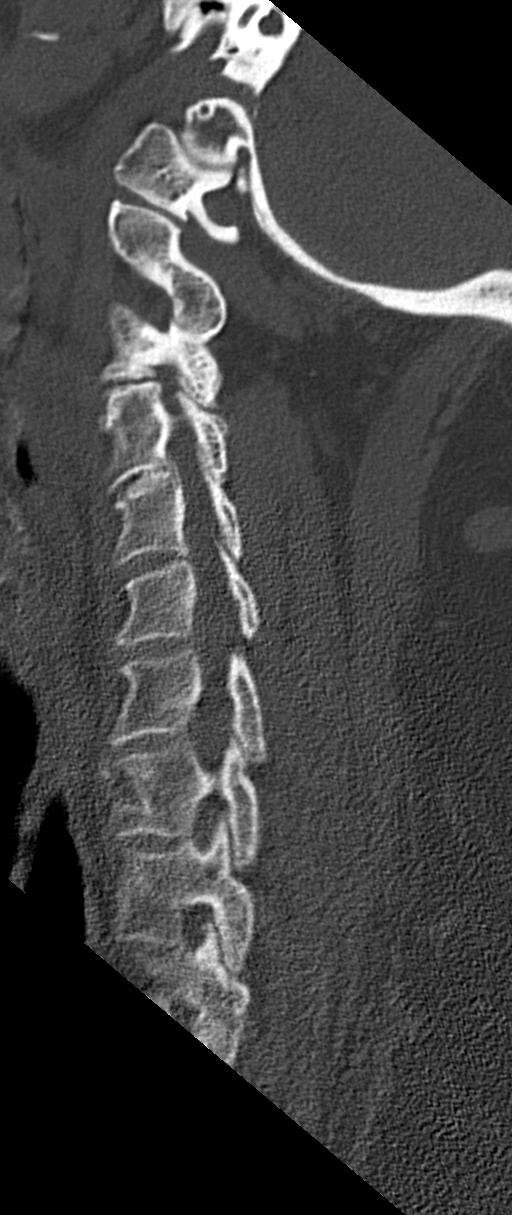
[im 28/55  soft-tissue]
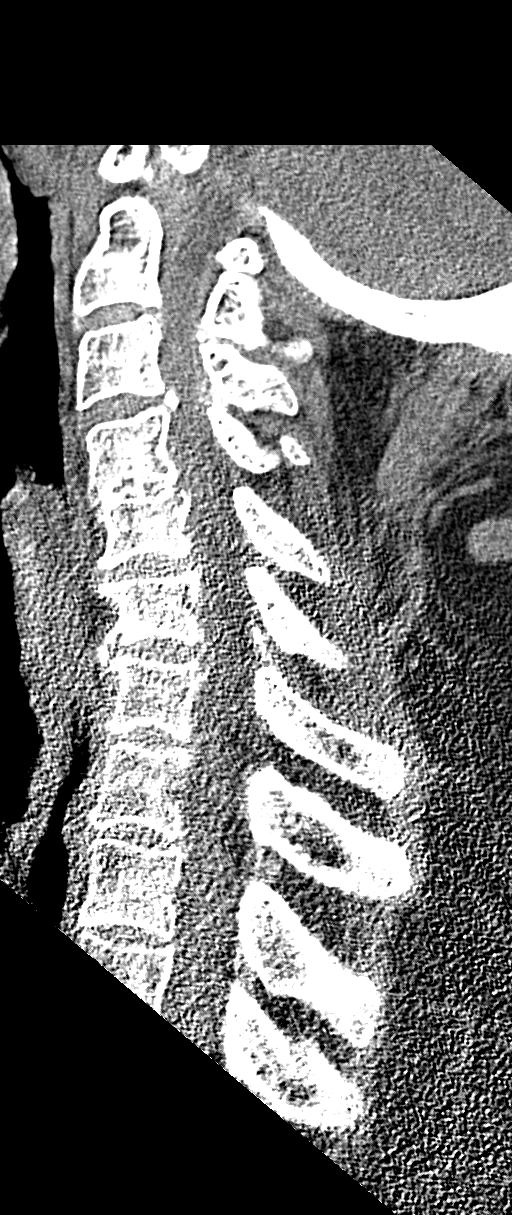
[im 28/55  bone]
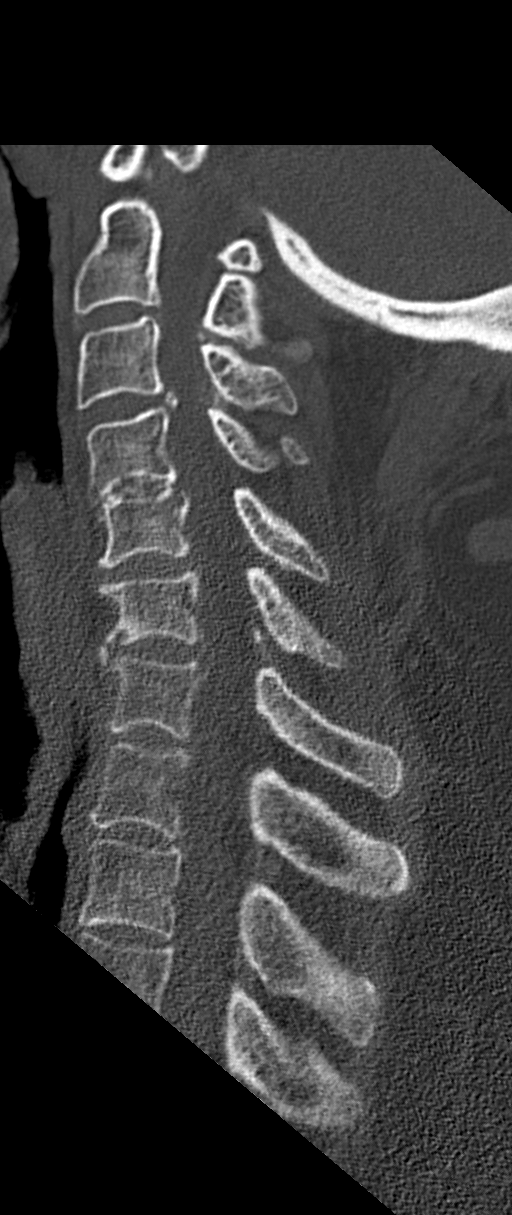
[im 32/55  bone]
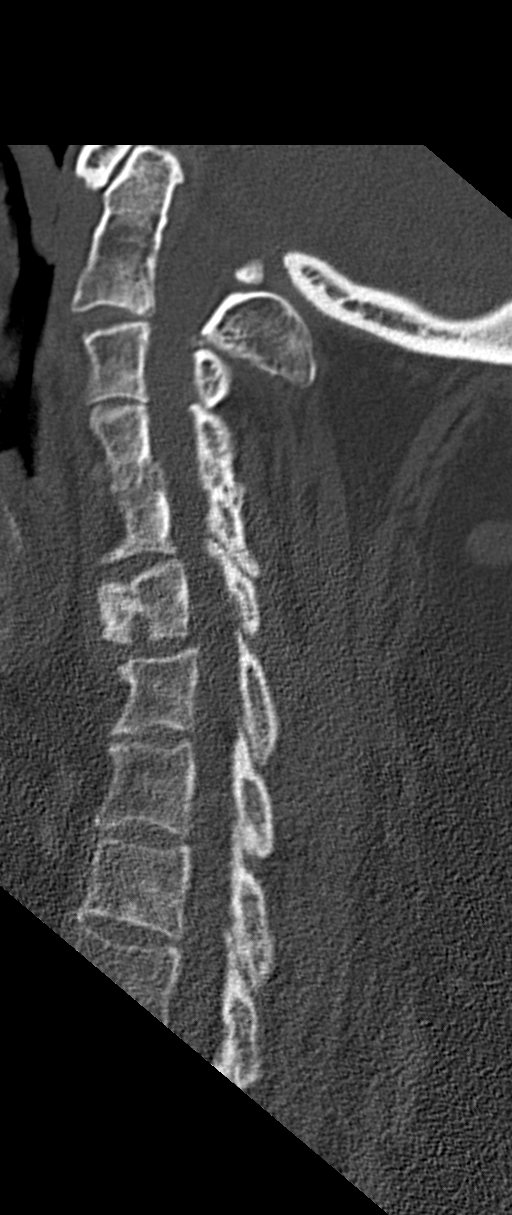
[im 37/55  bone]
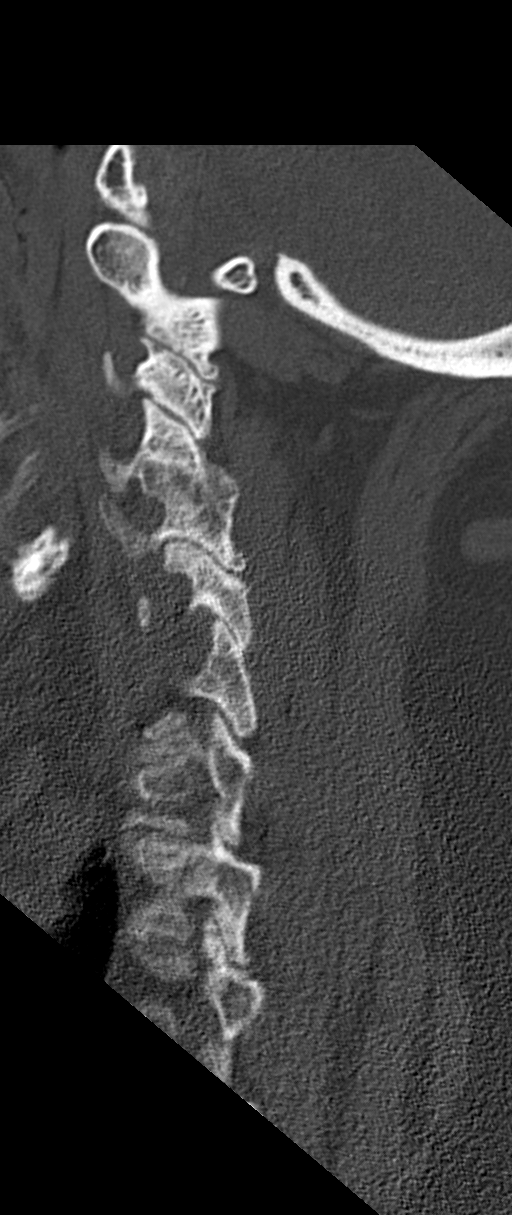

[9 of 28 positions shown; findings below may reference images not displayed]

FINDINGS: CT HEAD FINDINGS

Brain: There is mild age-related atrophy and chronic microvascular
ischemic changes. Subcentimeter bilateral basal ganglia old lacunar
infarcts noted. There is no acute intracranial hemorrhage. No mass
effect midline shift. No extra-axial fluid collection.

Vascular: No hyperdense vessel or unexpected calcification.

Skull: Normal. Negative for fracture or focal lesion.

Sinuses/Orbits: The visualized paranasal sinuses and mastoid air
cells are clear.

Other: None

CT CERVICAL SPINE FINDINGS

Alignment: No acute subluxation. There is mild reversal of normal
cervical lordosis which may be positional or due to muscle spasm

Skull base and vertebrae: No acute fracture

Soft tissues and spinal canal: No prevertebral fluid or swelling. No
visible canal hematoma.

Disc levels: Multilevel degenerative changes with multilevel facet
arthropathy.

Upper chest: There is diffuse interstitial and interlobular septal
prominence, possibly edema.

Other: A pacemaker device is partially visualized. There are
bilateral carotid bulb calcified plaques.
IMPRESSION: 1. No acute intracranial pathology. Mild age-related atrophy and
chronic microvascular ischemic changes.
2. No acute/traumatic cervical spine pathology. Multilevel
degenerative changes.

## 2020-10-02 IMAGING — DX DG HIP (WITH OR WITHOUT PELVIS) 2-3V*L*
3 series · 3 of 3 positions shown · non-contrast
Comparison: Radiograph 05/31/2019

CLINICAL DATA: Fall, possible left hip fracture

EXAM:
DG HIP (WITH OR WITHOUT PELVIS) 2-3V LEFT

[pelvis ap]
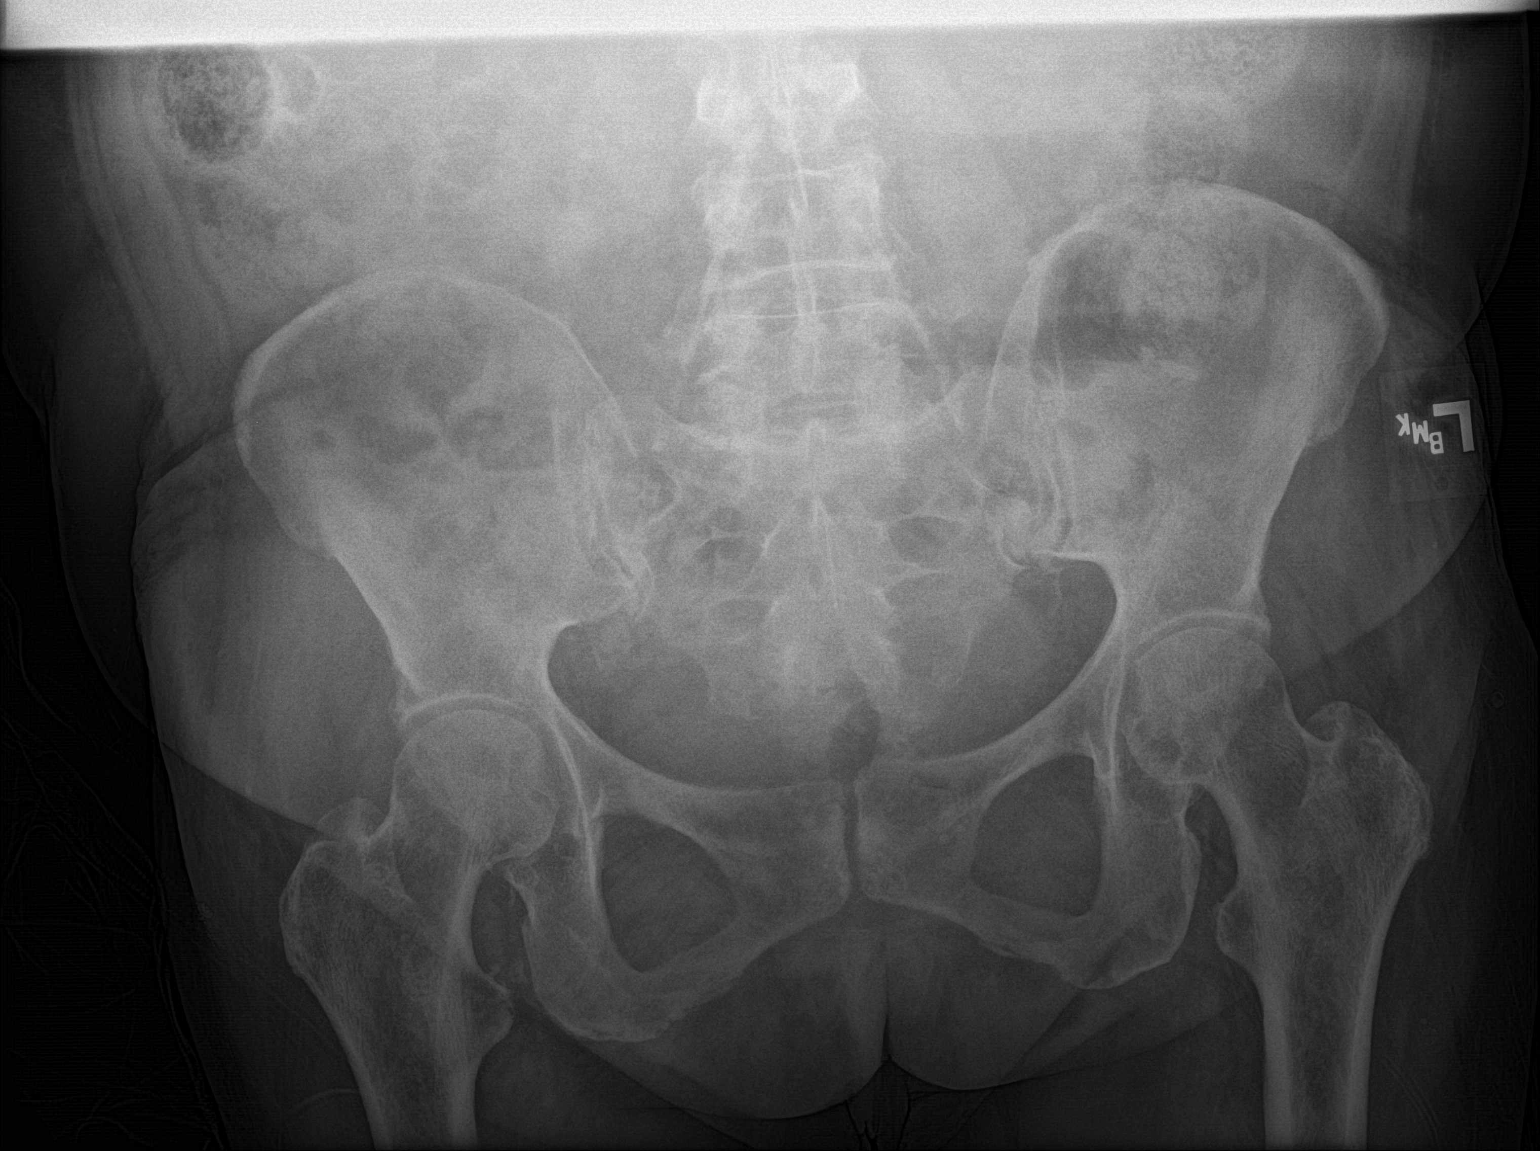

[hip ap]
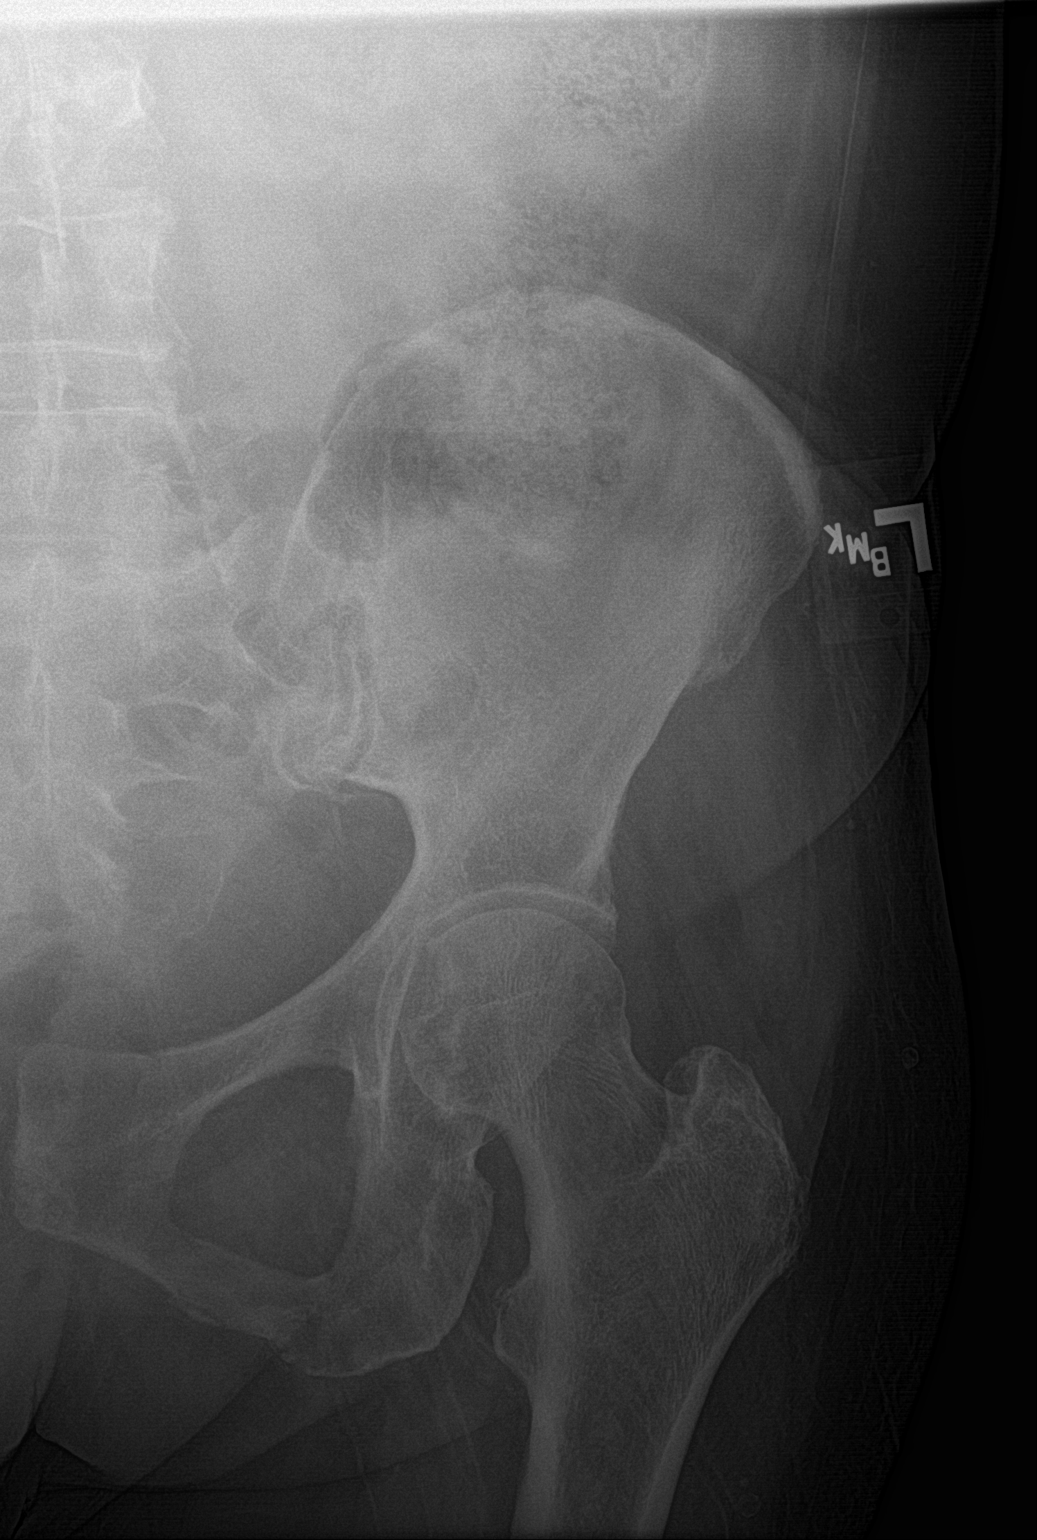

[hip lat]
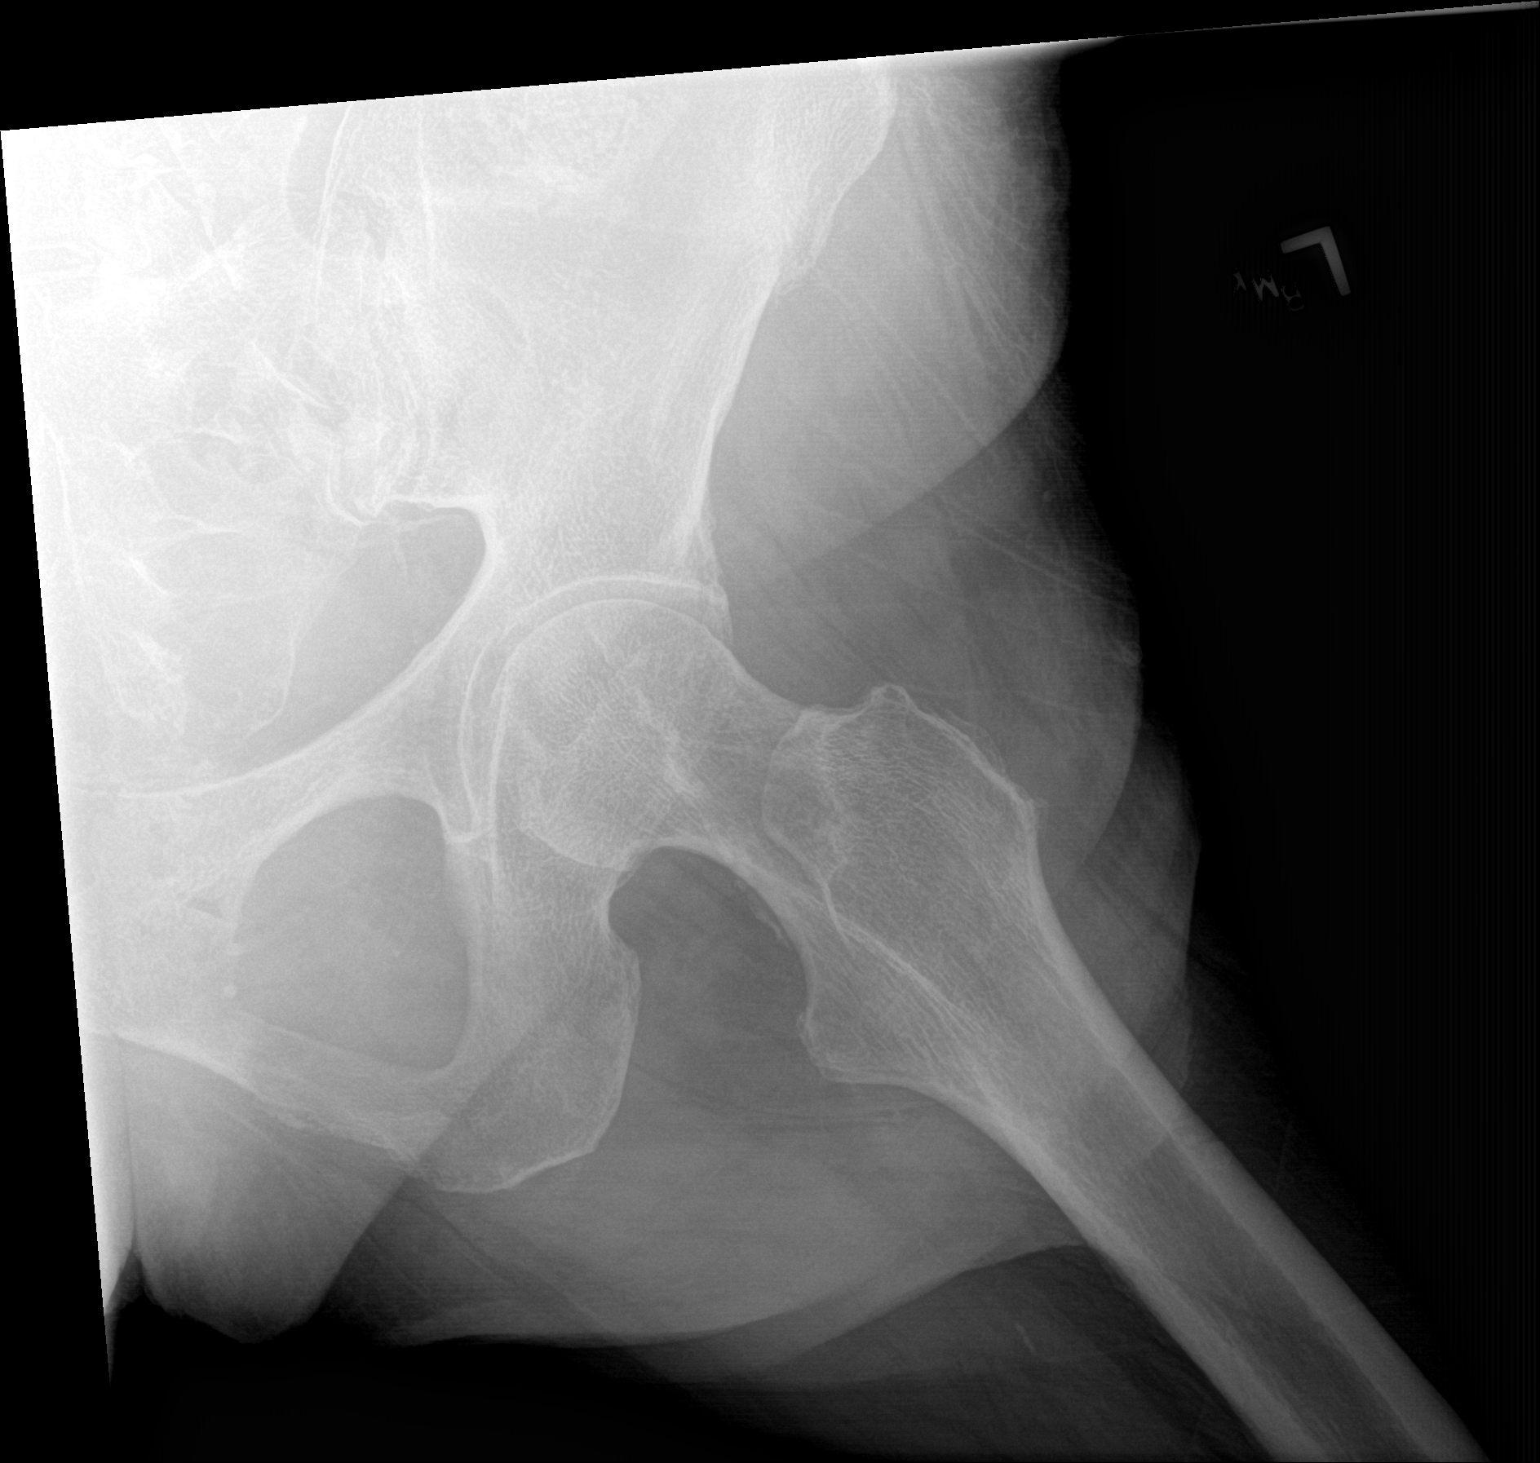

[3 of 3 positions shown; findings below may reference images not displayed]

FINDINGS: Bones of the pelvis appear intact and congruent. Proximal femora are
also intact and normally located within the acetabula. Degenerative
changes in the lower lumbar spine, hips and pelvis. Mild lateral
soft tissue swelling of the left hip. Vascular calcifications noted
in the pelvis. No other acute or significant osseous or soft tissue
abnormalities.
IMPRESSION: Mild lateral soft tissue swelling of the left hip. No acute osseous
abnormality.

## 2020-10-03 ENCOUNTER — Emergency Department
Admission: EM | Admit: 2020-10-03 | Discharge: 2020-10-03 | Disposition: A | Discharge status: Home / Self Care | Payer: Medicare Other | Attending: Emergency Medicine | Admitting: Emergency Medicine

## 2020-10-03 DIAGNOSIS — Z79899 Other long term (current) drug therapy: Secondary | ICD-10-CM | POA: Insufficient documentation

## 2020-10-03 DIAGNOSIS — E039 Hypothyroidism, unspecified: Secondary | ICD-10-CM | POA: Insufficient documentation

## 2020-10-03 DIAGNOSIS — E1122 Type 2 diabetes mellitus with diabetic chronic kidney disease: Secondary | ICD-10-CM | POA: Diagnosis not present

## 2020-10-03 DIAGNOSIS — M79605 Pain in left leg: Secondary | ICD-10-CM | POA: Diagnosis present

## 2020-10-03 DIAGNOSIS — J449 Chronic obstructive pulmonary disease, unspecified: Secondary | ICD-10-CM | POA: Insufficient documentation

## 2020-10-03 DIAGNOSIS — M79602 Pain in left arm: Secondary | ICD-10-CM | POA: Insufficient documentation

## 2020-10-03 DIAGNOSIS — Z7982 Long term (current) use of aspirin: Secondary | ICD-10-CM | POA: Insufficient documentation

## 2020-10-03 DIAGNOSIS — I5033 Acute on chronic diastolic (congestive) heart failure: Secondary | ICD-10-CM | POA: Diagnosis not present

## 2020-10-03 DIAGNOSIS — N1832 Chronic kidney disease, stage 3b: Secondary | ICD-10-CM | POA: Insufficient documentation

## 2020-10-03 DIAGNOSIS — Z8616 Personal history of COVID-19: Secondary | ICD-10-CM | POA: Diagnosis not present

## 2020-10-03 DIAGNOSIS — F17210 Nicotine dependence, cigarettes, uncomplicated: Secondary | ICD-10-CM | POA: Insufficient documentation

## 2020-10-03 DIAGNOSIS — G8929 Other chronic pain: Secondary | ICD-10-CM | POA: Diagnosis not present

## 2020-10-03 DIAGNOSIS — I130 Hypertensive heart and chronic kidney disease with heart failure and stage 1 through stage 4 chronic kidney disease, or unspecified chronic kidney disease: Secondary | ICD-10-CM | POA: Diagnosis not present

## 2020-10-03 DIAGNOSIS — Z950 Presence of cardiac pacemaker: Secondary | ICD-10-CM | POA: Insufficient documentation

## 2020-10-03 DIAGNOSIS — Z7984 Long term (current) use of oral hypoglycemic drugs: Secondary | ICD-10-CM | POA: Insufficient documentation

## 2020-10-03 DIAGNOSIS — Z794 Long term (current) use of insulin: Secondary | ICD-10-CM | POA: Diagnosis not present

## 2020-10-03 DIAGNOSIS — I2510 Atherosclerotic heart disease of native coronary artery without angina pectoris: Secondary | ICD-10-CM | POA: Diagnosis not present

## 2020-10-03 DIAGNOSIS — Z7952 Long term (current) use of systemic steroids: Secondary | ICD-10-CM | POA: Insufficient documentation

## 2020-10-03 MED ORDER — TRAMADOL HCL 50 MG PO TABS
50.0000 mg | ORAL_TABLET | Freq: Once | ORAL | Status: AC
  Administered 2020-10-03: 50 mg via ORAL
  Filled 2020-10-03: qty 1

## 2020-10-03 MED ORDER — TRAMADOL HCL 50 MG PO TABS: 50.0000 mg | ORAL_TABLET | Freq: Every day | ORAL | 0 refills | Status: DC | PRN

## 2020-10-03 NOTE — ED Notes (Signed)
Patient awaiting transport back to SNF. Updated with verbal understanding.

## 2020-10-03 NOTE — ED Notes (Signed)
Assisted patient to the commode

## 2020-10-03 NOTE — ED Notes (Signed)
Report called to [place] at [facility]. Patient is in no acute distress, sitting on the bed watching television. Still awaiting transport back to the SNF.

## 2020-10-03 NOTE — ED Provider Notes (Signed)
[facility] Emergency Department Provider Note ____________________________________________  Time seen: Approximately 8:01 PM  I have reviewed the triage vital signs and the nursing notes.   HISTORY  Chief Complaint Pain    HPI Melanie King is a 75 y.o. female who presents to the emergency department for evaluation and treatment of left side pain from neck to ankle. No injury. No relief with tylenol.   Past Medical History:  Diagnosis Date   Arthritis    CHF (congestive heart failure) (HCC)    COPD (chronic obstructive pulmonary disease) (HCC)    Coronary arteriosclerosis    DDD (degenerative disc disease), cervical    DDD (degenerative disc disease), lumbar    Depression    Diabetes mellitus without complication (HCC)    GERD (gastroesophageal reflux disease)    Hyperlipidemia    Hypertension    Osteoarthritis    Pacemaker    Tremor     Patient Active Problem List   Diagnosis Date Noted   Malnutrition of moderate degree 08/28/2020   Elevated troponin 08/26/2020   CHF (congestive heart failure) (HCC) 08/26/2020   Carotid stenosis 04/12/2020   Dysphagia 07/19/2019   Acute respiratory failure due to COVID-19 (HCC) 07/16/2019   Respiratory distress    Acute on chronic congestive heart failure (HCC)    Acute respiratory failure with hypoxia (HCC) 07/15/2019   Frequent falls 05/26/2019   Generalized weakness 05/26/2019   Acute kidney injury superimposed on CKD lllb (HCC) 03/21/2019   Subdural hematoma (HCC) 03/20/2019   Cardiac pacemaker 03/17/2019   Hypothyroidism 03/10/2019   Depression    Acute on chronic diastolic CHF (congestive heart failure) (HCC)    Acute metabolic encephalopathy    Fall    Tobacco abuse    C6 cervical fracture (HCC)    Left hand pain 04/01/2018   Numbness and tingling in left hand 04/01/2018   Left hand weakness 03/05/2018   Confusion 05/12/2017   Seizure (HCC) 05/10/2017   Elevated sedimentation rate  04/04/2017   Elevated C-reactive protein (CRP) 04/04/2017   Chest pain, unspecified 04/02/2017   Bilateral lower extremity pain (Secondary Area of Pain) (R>L) 04/02/2017   Chronic hip pain, bilateral  (Secondary Area of Pain) (R>L) 04/02/2017   Chronic bilateral low back pain with bilateral sciatica [facility] Area of Pain) (R>L) 04/02/2017   Chronic pain syndrome 04/02/2017   Disorder of bone, unspecified 04/02/2017   Other long term (current) drug therapy 04/02/2017   Other specified health status 04/02/2017   Long term current use of opiate analgesic 04/02/2017   Hip pain, bilateral 12/06/2016   Tremor 05/27/2016   Trigger finger of left hand 02/16/2016   Osteoarthritis of both hands 02/16/2016   Angina pectoris (HCC) 01/17/2016   COPD with acute exacerbation (HCC) 01/17/2016   CAD (coronary artery disease) 01/17/2016   HLD (hyperlipidemia) 01/17/2016   Hypertension 01/17/2016   Sick sinus syndrome (HCC) 01/17/2016   Chronic pain 01/17/2016   Personal history of tobacco use, presenting hazards to health 11/16/2015   Type 2 diabetes mellitus without complication, with long-term current use of insulin (HCC) 05/12/2013    Past Surgical History:  Procedure Laterality Date   ABDOMINAL HYSTERECTOMY     BREAST BIOPSY Right 1994   neg cyst removed   CATARACT EXTRACTION W/PHACO Right 08/17/2020   Procedure: CATARACT EXTRACTION PHACO AND INTRAOCULAR LENS PLACEMENT (IOC) RIGHT DIABETIC 8.90 00:52.9;  Surgeon: Galen Manila, MD;  Location: [facility];  Service: Ophthalmology;  Laterality: Right;  Diabetic  CHOLECYSTECTOMY     EYE SURGERY  1964, 1966, and 1967   bilateral   FOOT SURGERY Right    cellulitis   PACEMAKER INSERTION     PPM GENERATOR CHANGEOUT N/A 02/24/2020   Procedure: PPM GENERATOR CHANGEOUT;  Surgeon: Marcina Millard, MD;  Location: [facility] INVASIVE CV [facility];  Service: Cardiovascular;  Laterality: N/A;   SPINE SURGERY     cyst removed; [facility]     Prior to Admission medications   Medication Sig Start Date End Date Taking? Authorizing Provider  traMADol (ULTRAM) 50 MG tablet Take 1 tablet (50 mg total) by mouth daily as needed. 10/03/20  Yes Champ Keetch B, FNP  acetaminophen (TYLENOL) 500 MG tablet Take 500 mg by mouth every 6 (six) hours as needed for mild pain or moderate pain.    [provider]  amLODipine (NORVASC) 10 MG tablet TAKE 1 TABLET BY MOUTH EVERY DAY 08/28/16   Gabriel Cirri, NP  ASPIRIN LOW DOSE 81 MG chewable tablet Chew 81 mg by mouth daily. 07/27/20   [provider]  benztropine (COGENTIN) 0.5 MG tablet Take 0.5 mg by mouth 2 (two) times daily.    [provider]  brexpiprazole (REXULTI) 2 MG TABS tablet Take 1 mg by mouth at bedtime.    [provider]  calcitRIOL (ROCALTROL) 0.25 MCG capsule Take 0.25 mcg by mouth daily.    [provider]  carbidopa-levodopa (SINEMET IR) 25-100 MG tablet Take 1 tablet by mouth 3 (three) times daily. 07/27/20   [provider]  cholecalciferol (VITAMIN D3) 25 MCG (1000 UNIT) tablet Take 400 Units by mouth daily.     [provider]  citalopram (CELEXA) 20 MG tablet Take 20 mg by mouth daily.    [provider]  DUREZOL 0.05 % EMUL Apply 1 drop to eye 2 (two) times daily. 07/28/20   [provider]  fenofibrate (TRICOR) 48 MG tablet Take 48 mg by mouth daily.    [provider]  fluticasone (FLONASE) 50 MCG/ACT nasal spray Place 2 sprays into both nostrils daily.    [provider]  fluticasone furoate-vilanterol (BREO ELLIPTA) 200-25 MCG/INH AEPB Inhale 1 puff into the lungs daily. 08/31/20   Marrion Coy, MD  gabapentin (NEURONTIN) 100 MG capsule Take 100 mg by mouth at bedtime.    [provider]  Lactobacillus Acid-Pectin (ACIDOPHILUS/PECTIN) CAPS Take 1 capsule by mouth in the morning and at bedtime. 07/27/20   [provider]  levothyroxine (SYNTHROID) 150 MCG  tablet Take 150 mcg by mouth daily. 07/27/20   [provider]  losartan (COZAAR) 50 MG tablet Take 50 mg by mouth daily.    [provider]  magnesium oxide (MAG-OX) 400 MG tablet Take 400 mg by mouth daily.    [provider]  metFORMIN (GLUCOPHAGE) 1000 MG tablet Take 0.5 tablets (500 mg total) by mouth 2 (two) times daily with a meal. 05/28/19   Dhungel, Nishant, MD  metoprolol tartrate (LOPRESSOR) 25 MG tablet Take 25 mg by mouth 2 (two) times daily.    [provider]  montelukast (SINGULAIR) 10 MG tablet Take 10 mg by mouth daily.    [provider]  nitroGLYCERIN (NITRODUR - DOSED IN MG/24 HR) 0.2 mg/hr patch Place 0.2 mg onto the skin daily. leave patch on 12-14 hours, then remove for 10-12 hours prior to applying the next patch    [provider]  pantoprazole (PROTONIX) 40 MG tablet Take 40 mg by mouth daily.  [provider]  primidone (MYSOLINE) 50 MG tablet Take 50 mg by mouth 2 (two) times daily.    [provider]  senna-docusate (SENOKOT-S) 8.6-50 MG tablet Take 1 tablet by mouth 2 (two) times daily.    [provider]  sitaGLIPtin (JANUVIA) 50 MG tablet Take 50 mg by mouth daily.    [provider]  torsemide (DEMADEX) 20 MG tablet Take 1 tablet (20 mg total) by mouth daily. 08/30/20   Marrion Coy, MD  traZODone (DESYREL) 50 MG tablet Take by mouth at bedtime as needed for sleep.    [provider]  vitamin B-12 (CYANOCOBALAMIN) 1000 MCG tablet Take 2,000 mcg by mouth daily.    [provider]    Allergies Alprazolam, Fentanyl, Meperidine, Meperidine hcl, and Ranitidine hcl  Family History  Problem Relation Age of Onset   Breast cancer Maternal Aunt 82   Cancer Mother        colon   Aneurysm Father     Social History Social History   Tobacco Use   Smoking status: Every Day    Packs/day: 1.00    Years: 50.00    Pack years: 50.00    Types: Cigarettes    Smokeless tobacco: Never  Vaping Use   Vaping Use: Every day  Substance Use Topics   Alcohol use: No   Drug use: No    Review of Systems Constitutional: Negative for fever. Cardiovascular: Negative for chest pain. Respiratory: Negative for shortness of breath. Musculoskeletal: Positive for left upper and lower extremity pain.  Skin: No open wounds or lesions.   Neurological: Negative for decrease in sensation  ____________________________________________   PHYSICAL EXAM:  VITAL SIGNS: ED Triage Vitals  Enc Vitals Group     BP 10/03/20 1858 (!) 179/58     Pulse Rate 10/03/20 1858 71     Resp 10/03/20 1858 14     Temp 10/03/20 1858 98.3 F (36.8 C)     Temp Source 10/03/20 1858 Oral     SpO2 10/03/20 1858 95 %     Weight --      Height --      Head Circumference --      Peak Flow --      Pain Score 10/03/20 1859 5     Pain Loc --      Pain Edu? --      Excl. in GC? --     Constitutional: Alert and oriented. Well appearing and in no acute distress. Eyes: Conjunctivae are clear without discharge or drainage Head: Atraumatic Neck: Supple Respiratory: No cough. Respirations are even and unlabored. Musculoskeletal: Demonstrates active ROM of extremities x 4. Neurologic: Awake, alert, oriented x 4.  Skin: No open wounds or lesions.  Psychiatric: Affect and behavior are appropriate.  ____________________________________________   LABS (all labs ordered are listed, but only abnormal results are displayed)  Labs Reviewed - No data to display ____________________________________________  RADIOLOGY  Not indicated.  I, Kem Boroughs, personally viewed and evaluated these images (plain radiographs) as part of my medical decision making, as well as reviewing the written report by the radiologist.  No results found. ____________________________________________   PROCEDURES  Procedures  ____________________________________________   INITIAL IMPRESSION /  ASSESSMENT AND PLAN / ED COURSE  Melanie F Dressel is a 75 y.o. who presents to the emergency department for chronic pain of left upper and lower extremity without new injury. See HPI.  Patient would like a Tramadol. She states the facility won't  give her anything stronger. She states that she has "had every test there is to figure out this pain." No new injury or recent falls.  Tramadol given and she will be discharged home to follow up with primary care.  Medications  traMADol (ULTRAM) tablet 50 mg (50 mg Oral Given 10/03/20 1948)    Pertinent labs & imaging results that were available during my care of the patient were reviewed by me and considered in my medical decision making (see chart for details).   _________________________________________   FINAL CLINICAL IMPRESSION(S) / ED DIAGNOSES  Final diagnoses:  Chronic pain of left upper extremity  Chronic pain of left lower extremity    ED Discharge Orders          Ordered    traMADol (ULTRAM) 50 MG tablet  Daily PRN        10/03/20 1908             If controlled substance prescribed during this visit, 12 month history viewed on the [facility] prior to issuing an initial prescription for Schedule II or III opiod.    Chinita Pester, FNP 10/03/20 Mariel Aloe, MD 10/03/20 2007

## 2020-10-03 NOTE — Discharge Instructions (Addendum)
Please follow up with Dr. Judithann Sheen for further management of your chronic pain.

## 2020-10-03 NOTE — ED Triage Notes (Signed)
Pt BIB [facility] sided pain from head to toe. Pt denies other complaints, however does have audible wheezing on arrival. EMS gave 2.5 albuterol en route.

## 2020-10-06 ENCOUNTER — Other Ambulatory Visit (INDEPENDENT_AMBULATORY_CARE_PROVIDER_SITE_OTHER): Payer: Self-pay | Admitting: Vascular Surgery

## 2020-10-06 DIAGNOSIS — I6523 Occlusion and stenosis of bilateral carotid arteries: Secondary | ICD-10-CM

## 2020-10-10 NOTE — Progress Notes (Signed)
MRN : [id number]  Melanie King is a 75 y.o. (August 22, 1945) female who presents with chief complaint of follow up for carotid blockage.  History of Present Illness:   The patient is seen for evaluation of carotid stenosis. The carotid stenosis was identified after a duplex ultrasound.   The patient denies amaurosis fugax. There is no recent history of TIA symptoms or focal motor deficits. There is no prior documented CVA.  She is c/o severe pain in the left side and was very uncomfortable on the exam table.   There is no history of migraine headaches. There is no history of seizures.   The patient is taking enteric-coated aspirin 81 mg daily.   The patient has a history of coronary artery disease, no recent episodes of angina or shortness of breath. The patient denies PAD or claudication symptoms. There is a history of hyperlipidemia which is being treated with a statin.    Patient did undergo a CT with contrast of the head and neck in May 2021 which reports a 70% left ICA stenosis.   CT of the head dated December 30, 2019 demonstrates bilateral small lacunar infarcts unchanged from previous scans no remote large left hemispheric event noted.   Duplex ultrasound of the carotid arteries shows RICA 1-39% and LICA 40-59%  No outpatient medications have been marked as taking for the 10/11/20 encounter (Appointment) with Gilda Crease, Latina Craver, MD.    Past Medical History:  Diagnosis Date   Arthritis    CHF (congestive heart failure) (HCC)    COPD (chronic obstructive pulmonary disease) (HCC)    Coronary arteriosclerosis    DDD (degenerative disc disease), cervical    DDD (degenerative disc disease), lumbar    Depression    Diabetes mellitus without complication (HCC)    GERD (gastroesophageal reflux disease)    Hyperlipidemia    Hypertension    Osteoarthritis    Pacemaker    Tremor     Past Surgical History:  Procedure Laterality Date   ABDOMINAL HYSTERECTOMY     BREAST  BIOPSY Right 1994   neg cyst removed   CATARACT EXTRACTION W/PHACO Right 08/17/2020   Procedure: CATARACT EXTRACTION PHACO AND INTRAOCULAR LENS PLACEMENT (IOC) RIGHT DIABETIC 8.90 00:52.9;  Surgeon: Galen Manila, MD;  Location: [facility];  Service: Ophthalmology;  Laterality: Right;  Diabetic   CHOLECYSTECTOMY     EYE SURGERY  1964, 1966, and 1967   bilateral   FOOT SURGERY Right    cellulitis   PACEMAKER INSERTION     PPM GENERATOR CHANGEOUT N/A 02/24/2020   Procedure: PPM GENERATOR CHANGEOUT;  Surgeon: Marcina Millard, MD;  Location: [facility];  Service: Cardiovascular;  Laterality: N/A;   SPINE SURGERY     cyst removed; [facility]    Social History Social History   Tobacco Use   Smoking status: Every Day    Packs/day: 1.00    Years: 50.00    Pack years: 50.00    Types: Cigarettes   Smokeless tobacco: Never  Vaping Use   Vaping Use: Every day  Substance Use Topics   Alcohol use: No   Drug use: No    Family History Family History  Problem Relation Age of Onset   Breast cancer Maternal Aunt 90   Cancer Mother        colon   Aneurysm Father     Allergies  Allergen Reactions   Alprazolam Swelling   Fentanyl Other (See Comments)    "burning  and hot"   Meperidine Itching    Other reaction(s): Other (See Comments)   Meperidine Hcl    Ranitidine Hcl     Other reaction(s): Other (See Comments)     REVIEW OF SYSTEMS (Negative unless checked)  Constitutional: [] Weight loss  [] Fever  [] Chills Cardiac: [] Chest pain   [] Chest pressure   [] Palpitations   [] Shortness of breath when laying flat   [] Shortness of breath with exertion. Vascular:  [] Pain in legs with walking   [] Pain in legs at rest  [] History of DVT   [] Phlebitis   [] Swelling in legs   [] Varicose veins   [] Non-healing ulcers Pulmonary:   [] Uses home oxygen   [] Productive cough   [] Hemoptysis   [] Wheeze  [] COPD   [] Asthma Neurologic:  [] Dizziness   [] Seizures   [] History of  stroke   [] History of TIA  [] Aphasia   [] Vissual changes   [] Weakness or numbness in arm   [] Weakness or numbness in leg Musculoskeletal:   [] Joint swelling   [x] Joint pain   [x] Low back pain Hematologic:  [] Easy bruising  [] Easy bleeding   [] Hypercoagulable state   [] Anemic Gastrointestinal:  [] Diarrhea   [] Vomiting  [] Gastroesophageal reflux/heartburn   [] Difficulty swallowing. Genitourinary:  [] Chronic kidney disease   [] Difficult urination  [] Frequent urination   [] Blood in urine Skin:  [] Rashes   [] Ulcers  Psychological:  [] History of anxiety   []  History of major depression.  Physical Examination  There were no vitals filed for this visit. There is no height or weight on file to calculate BMI. Gen: WD/WN, NAD Head: [place]/AT, No temporalis wasting.  Ear/Nose/Throat: Hearing grossly intact, nares w/o erythema or drainage Eyes: PER, EOMI, sclera nonicteric.  Neck: Supple, no masses.  No bruit or JVD.  Pulmonary:  Good air movement, no audible wheezing, no use of accessory muscles.  Cardiac: RRR, normal S1, S2, no Murmurs. Vascular:   No carotid bruits noted Vessel Right Left  Radial Palpable Palpable  Carotid Palpable Palpable  Gastrointestinal: soft, non-distended. No guarding/no peritoneal signs.  Musculoskeletal: M/S 5/5 throughout.  No visible deformity.  Neurologic: CN 2-12 intact. Pain and light touch intact in extremities.  Symmetrical.  Speech is fluent. Motor exam as listed above. Psychiatric: Judgment intact, Mood & affect appropriate for pt's clinical situation. Dermatologic: No rashes or ulcers noted.  No changes consistent with cellulitis.   CBC Lab Results  Component Value Date   WBC 12.9 (H) 08/30/2020   HGB 10.7 (L) 08/30/2020   HCT 32.0 (L) 08/30/2020   MCV 95.0 08/30/2020   PLT 457 (H) 08/30/2020    BMET    Component Value Date/Time   NA 131 (L) 08/30/2020 0724   NA 143 04/02/2017 1508   NA 136 06/20/2013 0938   K 4.2 08/30/2020 0724   K 4.7 06/20/2013  0938   CL 96 (L) 08/30/2020 0724   CL 102 06/20/2013 0938   CO2 25 08/30/2020 0724   CO2 33 (H) 06/20/2013 0938   GLUCOSE 380 (H) 08/30/2020 0724   GLUCOSE 115 (H) 06/20/2013 0938   BUN 63 (H) 08/30/2020 0724   BUN 18 04/02/2017 1508   BUN 22 (H) 06/20/2013 0938   CREATININE 1.80 (H) 08/30/2020 0724   CREATININE 0.72 06/20/2013 0938   CALCIUM 9.9 08/30/2020 0724   CALCIUM 9.1 06/20/2013 0938   GFRNONAA 29 (L) 08/30/2020 0724   GFRNONAA >60 06/20/2013 0938   GFRAA 52 (L) 09/17/2019 0847   GFRAA >60 06/20/2013 0938   CrCl cannot be calculated (  Patient's most recent lab result is older than the maximum 21 days allowed.).  COAG Lab Results  Component Value Date   INR 1.04 05/10/2017    Radiology No results found.   Assessment/Plan 1. Bilateral carotid artery stenosis Recommend:  Given the patient's asymptomatic subcritical stenosis no further invasive testing or surgery at this time.  Duplex ultrasound of the carotid arteries shows RICA 1-39% and LICA 40-59%  Continue antiplatelet therapy as prescribed Continue management of CAD, HTN and Hyperlipidemia Healthy heart diet,  encouraged exercise at least 4 times per week Follow up in 12 months with duplex ultrasound and physical exam.    - VAS US CAROTID; Future  2. Coronary artery disease involving native heart without angina pectoris, unspecified vessel or lesion type Continue cardiac and antihypertensive medications as already ordered and reviewed, no changes at this time.  Continue statin as ordered and reviewed, no changes at this time  Nitrates PRN for chest pain   3. Primary hypertension Continue antihypertensive medications as already ordered, these medications have been reviewed and there are no changes at this time.   4. Type 2 diabetes mellitus without complication, with long-term current use of insulin (HCC) Continue hypoglycemic medications as already ordered, these medications have been reviewed and  there are no changes at this time.  Hgb A1C to be monitored as already arranged by primary service     Levora Dredge, MD  10/10/2020 2:54 PM

## 2020-10-11 ENCOUNTER — Other Ambulatory Visit: Payer: Self-pay

## 2020-10-11 ENCOUNTER — Ambulatory Visit (INDEPENDENT_AMBULATORY_CARE_PROVIDER_SITE_OTHER): Payer: Medicare Other

## 2020-10-11 ENCOUNTER — Ambulatory Visit (INDEPENDENT_AMBULATORY_CARE_PROVIDER_SITE_OTHER): Payer: Medicare Other | Admitting: Vascular Surgery

## 2020-10-11 ENCOUNTER — Encounter (INDEPENDENT_AMBULATORY_CARE_PROVIDER_SITE_OTHER): Payer: Self-pay | Admitting: Vascular Surgery

## 2020-10-11 VITALS — BP 183/72 | HR 81 | Wt 149.0 lb

## 2020-10-11 DIAGNOSIS — I6523 Occlusion and stenosis of bilateral carotid arteries: Secondary | ICD-10-CM

## 2020-10-11 DIAGNOSIS — Z794 Long term (current) use of insulin: Secondary | ICD-10-CM

## 2020-10-11 DIAGNOSIS — E119 Type 2 diabetes mellitus without complications: Secondary | ICD-10-CM | POA: Diagnosis not present

## 2020-10-11 DIAGNOSIS — I2510 Atherosclerotic heart disease of native coronary artery without angina pectoris: Secondary | ICD-10-CM | POA: Diagnosis not present

## 2020-10-11 DIAGNOSIS — I10 Essential (primary) hypertension: Secondary | ICD-10-CM | POA: Diagnosis not present

## 2020-10-12 ENCOUNTER — Other Ambulatory Visit: Payer: Self-pay

## 2020-10-12 ENCOUNTER — Emergency Department
Admission: EM | Admit: 2020-10-12 | Discharge: 2020-10-12 | Disposition: A | Discharge status: Home / Self Care | Payer: Medicare Other | Attending: Emergency Medicine | Admitting: Emergency Medicine

## 2020-10-12 ENCOUNTER — Emergency Department: Payer: Medicare Other

## 2020-10-12 DIAGNOSIS — W07XXXA Fall from chair, initial encounter: Secondary | ICD-10-CM | POA: Diagnosis not present

## 2020-10-12 DIAGNOSIS — J441 Chronic obstructive pulmonary disease with (acute) exacerbation: Secondary | ICD-10-CM | POA: Diagnosis not present

## 2020-10-12 DIAGNOSIS — S0990XA Unspecified injury of head, initial encounter: Secondary | ICD-10-CM | POA: Diagnosis present

## 2020-10-12 DIAGNOSIS — I130 Hypertensive heart and chronic kidney disease with heart failure and stage 1 through stage 4 chronic kidney disease, or unspecified chronic kidney disease: Secondary | ICD-10-CM | POA: Diagnosis not present

## 2020-10-12 DIAGNOSIS — Z79899 Other long term (current) drug therapy: Secondary | ICD-10-CM | POA: Insufficient documentation

## 2020-10-12 DIAGNOSIS — N1832 Chronic kidney disease, stage 3b: Secondary | ICD-10-CM | POA: Diagnosis not present

## 2020-10-12 DIAGNOSIS — Z7982 Long term (current) use of aspirin: Secondary | ICD-10-CM | POA: Diagnosis not present

## 2020-10-12 DIAGNOSIS — E1122 Type 2 diabetes mellitus with diabetic chronic kidney disease: Secondary | ICD-10-CM | POA: Diagnosis not present

## 2020-10-12 DIAGNOSIS — Z7951 Long term (current) use of inhaled steroids: Secondary | ICD-10-CM | POA: Insufficient documentation

## 2020-10-12 DIAGNOSIS — Z7984 Long term (current) use of oral hypoglycemic drugs: Secondary | ICD-10-CM | POA: Diagnosis not present

## 2020-10-12 DIAGNOSIS — I5033 Acute on chronic diastolic (congestive) heart failure: Secondary | ICD-10-CM | POA: Insufficient documentation

## 2020-10-12 DIAGNOSIS — S0001XA Abrasion of scalp, initial encounter: Secondary | ICD-10-CM | POA: Insufficient documentation

## 2020-10-12 DIAGNOSIS — F17210 Nicotine dependence, cigarettes, uncomplicated: Secondary | ICD-10-CM | POA: Insufficient documentation

## 2020-10-12 DIAGNOSIS — I2510 Atherosclerotic heart disease of native coronary artery without angina pectoris: Secondary | ICD-10-CM | POA: Diagnosis not present

## 2020-10-12 DIAGNOSIS — E039 Hypothyroidism, unspecified: Secondary | ICD-10-CM | POA: Diagnosis not present

## 2020-10-12 DIAGNOSIS — W19XXXA Unspecified fall, initial encounter: Secondary | ICD-10-CM

## 2020-10-12 DIAGNOSIS — Z950 Presence of cardiac pacemaker: Secondary | ICD-10-CM | POA: Insufficient documentation

## 2020-10-12 NOTE — ED Provider Notes (Signed)
[facility]-EMERGENCY DEPARTMENT  ____________________________________________  Time seen: Approximately 6:17 PM  I have reviewed the triage vital signs and the nursing notes.   HISTORY  Chief Complaint Fall (No blood thinners per patient/)   Historian Patient     HPI Melanie King is a 75 y.o. female presents to the emergency department after patient had a mechanical fall.  Patient reports that she was trying to sit down and accidentally missed the chair.  Patient denies loss of consciousness.  She has a superficial occipital abrasion.  No neck pain.  No numbness or tingling in the upper and lower extremities.  No chest pain, chest tightness or abdominal pain.  Patient reports her tetanus status is up-to-date.  No other alleviating measures have been attempted.   Past Medical History:  Diagnosis Date   Arthritis    CHF (congestive heart failure) (HCC)    COPD (chronic obstructive pulmonary disease) (HCC)    Coronary arteriosclerosis    DDD (degenerative disc disease), cervical    DDD (degenerative disc disease), lumbar    Depression    Diabetes mellitus without complication (HCC)    GERD (gastroesophageal reflux disease)    Hyperlipidemia    Hypertension    Osteoarthritis    Pacemaker    Tremor      Immunizations up to date:  Yes.     Past Medical History:  Diagnosis Date   Arthritis    CHF (congestive heart failure) (HCC)    COPD (chronic obstructive pulmonary disease) (HCC)    Coronary arteriosclerosis    DDD (degenerative disc disease), cervical    DDD (degenerative disc disease), lumbar    Depression    Diabetes mellitus without complication (HCC)    GERD (gastroesophageal reflux disease)    Hyperlipidemia    Hypertension    Osteoarthritis    Pacemaker    Tremor     Patient Active Problem List   Diagnosis Date Noted   Malnutrition of moderate degree 08/28/2020   Elevated troponin 08/26/2020   CHF (congestive heart failure) (HCC) 08/26/2020    Carotid stenosis 04/12/2020   Dysphagia 07/19/2019   Acute respiratory failure due to COVID-19 (HCC) 07/16/2019   Respiratory distress    Acute on chronic congestive heart failure (HCC)    Acute respiratory failure with hypoxia (HCC) 07/15/2019   Frequent falls 05/26/2019   Generalized weakness 05/26/2019   Acute kidney injury superimposed on CKD lllb (HCC) 03/21/2019   Subdural hematoma (HCC) 03/20/2019   Cardiac pacemaker 03/17/2019   Hypothyroidism 03/10/2019   Depression    Acute on chronic diastolic CHF (congestive heart failure) (HCC)    Acute metabolic encephalopathy    Fall    Tobacco abuse    C6 cervical fracture (HCC)    Left hand pain 04/01/2018   Numbness and tingling in left hand 04/01/2018   Left hand weakness 03/05/2018   Confusion 05/12/2017   Seizure (HCC) 05/10/2017   Elevated sedimentation rate 04/04/2017   Elevated C-reactive protein (CRP) 04/04/2017   Chest pain, unspecified 04/02/2017   Bilateral lower extremity pain (Secondary Area of Pain) (R>L) 04/02/2017   Chronic hip pain, bilateral  (Secondary Area of Pain) (R>L) 04/02/2017   Chronic bilateral low back pain with bilateral sciatica [facility] Area of Pain) (R>L) 04/02/2017   Chronic pain syndrome 04/02/2017   Disorder of bone, unspecified 04/02/2017   Other long term (current) drug therapy 04/02/2017   Other specified health status 04/02/2017   Long term current use of opiate analgesic 04/02/2017  Hip pain, bilateral 12/06/2016   Tremor 05/27/2016   Trigger finger of left hand 02/16/2016   Osteoarthritis of both hands 02/16/2016   Angina pectoris (HCC) 01/17/2016   COPD with acute exacerbation (HCC) 01/17/2016   CAD (coronary artery disease) 01/17/2016   HLD (hyperlipidemia) 01/17/2016   Hypertension 01/17/2016   Sick sinus syndrome (HCC) 01/17/2016   Chronic pain 01/17/2016   Personal history of tobacco use, presenting hazards to health 11/16/2015   Type 2 diabetes mellitus without  complication, with long-term current use of insulin (HCC) 05/12/2013    Past Surgical History:  Procedure Laterality Date   ABDOMINAL HYSTERECTOMY     BREAST BIOPSY Right 1994   neg cyst removed   CATARACT EXTRACTION W/PHACO Right 08/17/2020   Procedure: CATARACT EXTRACTION PHACO AND INTRAOCULAR LENS PLACEMENT (IOC) RIGHT DIABETIC 8.90 00:52.9;  Surgeon: Galen Manila, MD;  Location: [facility];  Service: Ophthalmology;  Laterality: Right;  Diabetic   CHOLECYSTECTOMY     EYE SURGERY  1964, 1966, and 1967   bilateral   FOOT SURGERY Right    cellulitis   PACEMAKER INSERTION     PPM GENERATOR CHANGEOUT N/A 02/24/2020   Procedure: PPM GENERATOR CHANGEOUT;  Surgeon: Marcina Millard, MD;  Location: [facility];  Service: Cardiovascular;  Laterality: N/A;   SPINE SURGERY     cyst removed; [facility]    Prior to Admission medications   Medication Sig Start Date End Date Taking? Authorizing Provider  acetaminophen (TYLENOL) 500 MG tablet Take 500 mg by mouth every 6 (six) hours as needed for mild pain or moderate pain.    [provider]  amLODipine (NORVASC) 10 MG tablet TAKE 1 TABLET BY MOUTH EVERY DAY 08/28/16   Gabriel Cirri, NP  ASPIRIN LOW DOSE 81 MG chewable tablet Chew 81 mg by mouth daily. 07/27/20   [provider]  benztropine (COGENTIN) 0.5 MG tablet Take 0.5 mg by mouth 2 (two) times daily.    [provider]  brexpiprazole (REXULTI) 2 MG TABS tablet Take 1 mg by mouth at bedtime.    [provider]  calcitRIOL (ROCALTROL) 0.25 MCG capsule Take 0.25 mcg by mouth daily.    [provider]  carbidopa-levodopa (SINEMET IR) 25-100 MG tablet Take 1 tablet by mouth 3 (three) times daily. 07/27/20   [provider]  cholecalciferol (VITAMIN D3) 25 MCG (1000 UNIT) tablet Take 400 Units by mouth daily.     [provider]  citalopram (CELEXA) 20 MG tablet Take 20 mg by mouth daily.    [provider]  DUREZOL 0.05 % EMUL Apply 1 drop to eye 2 (two) times daily. 07/28/20   [provider]  fenofibrate (TRICOR) 48 MG tablet Take 48 mg by mouth daily.    [provider]  fluticasone (FLONASE) 50 MCG/ACT nasal spray Place 2 sprays into both nostrils daily.    [provider]  fluticasone furoate-vilanterol (BREO ELLIPTA) 200-25 MCG/INH AEPB Inhale 1 puff into the lungs daily. 08/31/20   Marrion Coy, MD  gabapentin (NEURONTIN) 100 MG capsule Take 100 mg by mouth at bedtime.    [provider]  Lactobacillus Acid-Pectin (ACIDOPHILUS/PECTIN) CAPS Take 1 capsule by mouth in the morning and at bedtime. 07/27/20   [provider]  levothyroxine (SYNTHROID) 150 MCG tablet Take 150 mcg by mouth daily. 07/27/20   [provider]  losartan (COZAAR) 50 MG tablet Take 50 mg by mouth daily.    [provider]  magnesium oxide (MAG-OX) 400 MG tablet Take 400 mg by mouth daily.    [provider]  metFORMIN (GLUCOPHAGE) 1000 MG tablet Take 0.5 tablets (500 mg total) by mouth 2 (two) times daily with a meal. 05/28/19   Dhungel, Nishant, MD  metoprolol tartrate (LOPRESSOR) 25 MG tablet Take 25 mg by mouth 2 (two) times daily.    [provider]  montelukast (SINGULAIR) 10 MG tablet Take 10 mg by mouth daily.    [provider]  nitroGLYCERIN (NITRODUR - DOSED IN MG/24 HR) 0.2 mg/hr patch Place 0.2 mg onto the skin daily. leave patch on 12-14 hours, then remove for 10-12 hours prior to applying the next patch    [provider]  pantoprazole (PROTONIX) 40 MG tablet Take 40 mg by mouth daily.    [provider]  primidone (MYSOLINE) 50 MG tablet Take 50 mg by mouth 2 (two) times daily.    [provider]  senna-docusate (SENOKOT-S) 8.6-50 MG tablet Take 1 tablet by mouth 2 (two) times daily.    [provider]  sitaGLIPtin (JANUVIA) 50 MG tablet Take 50 mg by mouth daily.     [provider]  torsemide (DEMADEX) 20 MG tablet Take 1 tablet (20 mg total) by mouth daily. 08/30/20   Marrion Coy, MD  traMADol (ULTRAM) 50 MG tablet Take 1 tablet (50 mg total) by mouth daily as needed. 10/03/20   Triplett, Rulon Eisenmenger B, FNP  traZODone (DESYREL) 50 MG tablet Take by mouth at bedtime as needed for sleep.    [provider]  vitamin B-12 (CYANOCOBALAMIN) 1000 MCG tablet Take 2,000 mcg by mouth daily.    [provider]    Allergies Alprazolam, Fentanyl, Meperidine, Meperidine hcl, and Ranitidine hcl  Family History  Problem Relation Age of Onset   Breast cancer Maternal Aunt 40   Cancer Mother        colon   Aneurysm Father     Social History Social History   Tobacco Use   Smoking status: Every Day    Packs/day: 1.00    Years: 50.00    Pack years: 50.00    Types: Cigarettes   Smokeless tobacco: Never  Vaping Use   Vaping Use: Every day  Substance Use Topics   Alcohol use: No   Drug use: No     Review of Systems  Constitutional: No fever/chills Eyes:  No discharge ENT: No upper respiratory complaints. Respiratory: no cough. No SOB/ use of accessory muscles to breath Gastrointestinal:   No nausea, no vomiting.  No diarrhea.  No constipation. Musculoskeletal: Negative for musculoskeletal pain. Skin: Patient has scalp abrasion.    ____________________________________________   PHYSICAL EXAM:  VITAL SIGNS: ED Triage Vitals  Enc Vitals Group     BP 10/12/20 1540 (!) 182/85     Pulse Rate 10/12/20 1540 70     Resp 10/12/20 1540 20     Temp 10/12/20 1540 97.6 F (36.4 C)     Temp Source 10/12/20 1540 Oral     SpO2 10/12/20 1540 98 %     Weight 10/12/20 1542 146 lb (66.2 kg)     Height 10/12/20 1542 5\' 1"  (1.549 m)     Head Circumference --      Peak Flow --      Pain Score 10/12/20 1541 6     Pain Loc --      Pain Edu? --      Excl. in GC? --  Constitutional: Alert and oriented. Well appearing and in no acute  distress. Eyes: Conjunctivae are normal. PERRL. EOMI. Head: Atraumatic.  Patient has superficial occipital abrasion. ENT:      Nose: No congestion/rhinnorhea.      Mouth/Throat: Mucous membranes are moist.  Neck: No stridor.  Full range of motion.  No midline C-spine tenderness to palpation. Cardiovascular: Normal rate, regular rhythm. Normal S1 and S2.  Good peripheral circulation. Respiratory: Normal respiratory effort without tachypnea or retractions. Lungs CTAB. Good air entry to the bases with no decreased or absent breath sounds Gastrointestinal: Bowel sounds x 4 quadrants. Soft and nontender to palpation. No guarding or rigidity. No distention. Musculoskeletal: Full range of motion to all extremities. No obvious deformities noted Neurologic:  Normal for age. No gross focal neurologic deficits are appreciated.  Skin:  Skin is warm, dry and intact. No rash noted. Psychiatric: Mood and affect are normal for age. Speech and behavior are normal.   ____________________________________________   LABS (all labs ordered are listed, but only abnormal results are displayed)  Labs Reviewed - No data to display ____________________________________________  EKG   ____________________________________________  RADIOLOGY Geraldo PitterI, Trinda Harlacher M Fairley Copher, personally viewed and evaluated these images (plain radiographs) as part of my medical decision making, as well as reviewing the written report by the radiologist.  CT HEAD WO CONTRAST (5MM)  Result Date: 10/12/2020 CLINICAL DATA:  Fall EXAM: CT HEAD WITHOUT CONTRAST CT CERVICAL SPINE WITHOUT CONTRAST TECHNIQUE: Multidetector CT imaging of the head and cervical spine was performed following the standard protocol without intravenous contrast. Multiplanar CT image reconstructions of the cervical spine were also generated. COMPARISON:  CT brain and cervical spine 08/05/2020 FINDINGS: CT HEAD FINDINGS Brain: No acute territorial infarction, hemorrhage or  intracranial mass. Atrophy and mild chronic small vessel ischemic changes of the white matter. Stable ventricle size. Chronic lacunar infarcts in the right basal ganglia. Vascular: No hyperdense vessels. Carotid and vertebral vascular calcification Skull: Normal. Negative for fracture or focal lesion. Sinuses/Orbits: No acute finding. Other: None CT CERVICAL SPINE FINDINGS Alignment: Mild reversal of cervical lordosis. Trace anterolisthesis C4 on C5 and C5 on C6 without change. Facet alignment maintained Skull base and vertebrae: No acute fracture. No primary bone lesion or focal pathologic process. Incomplete fusion posterior arch of C1 Soft tissues and spinal canal: No prevertebral fluid or swelling. No visible canal hematoma. Disc levels: Multilevel degenerative change, most advanced at C4-C5 and C6-C7. Facet degenerative changes at multiple levels. Upper chest: Negative. Other: None IMPRESSION: 1. No CT evidence for acute intracranial abnormality. Atrophy and chronic small vessel ischemic changes of the white matter. 2. Mild reversal of cervical lordosis with degenerative change. No acute osseous abnormality Electronically Signed   By: Jasmine PangKim  Fujinaga M.D.   On: 10/12/2020 16:53   CT Cervical Spine Wo Contrast  Result Date: 10/12/2020 CLINICAL DATA:  Fall EXAM: CT HEAD WITHOUT CONTRAST CT CERVICAL SPINE WITHOUT CONTRAST TECHNIQUE: Multidetector CT imaging of the head and cervical spine was performed following the standard protocol without intravenous contrast. Multiplanar CT image reconstructions of the cervical spine were also generated. COMPARISON:  CT brain and cervical spine 08/05/2020 FINDINGS: CT HEAD FINDINGS Brain: No acute territorial infarction, hemorrhage or intracranial mass. Atrophy and mild chronic small vessel ischemic changes of the white matter. Stable ventricle size. Chronic lacunar infarcts in the right basal ganglia. Vascular: No hyperdense vessels. Carotid and vertebral vascular  calcification Skull: Normal. Negative for fracture or focal lesion. Sinuses/Orbits: No acute finding. Other: None CT CERVICAL  SPINE FINDINGS Alignment: Mild reversal of cervical lordosis. Trace anterolisthesis C4 on C5 and C5 on C6 without change. Facet alignment maintained Skull base and vertebrae: No acute fracture. No primary bone lesion or focal pathologic process. Incomplete fusion posterior arch of C1 Soft tissues and spinal canal: No prevertebral fluid or swelling. No visible canal hematoma. Disc levels: Multilevel degenerative change, most advanced at C4-C5 and C6-C7. Facet degenerative changes at multiple levels. Upper chest: Negative. Other: None IMPRESSION: 1. No CT evidence for acute intracranial abnormality. Atrophy and chronic small vessel ischemic changes of the white matter. 2. Mild reversal of cervical lordosis with degenerative change. No acute osseous abnormality Electronically Signed   By: Jasmine Pang M.D.   On: 10/12/2020 16:53    ____________________________________________    PROCEDURES  Procedure(s) performed:     Procedures     Medications - No data to display   ____________________________________________   INITIAL IMPRESSION / ASSESSMENT AND PLAN / ED COURSE  Pertinent labs & imaging results that were available during my care of the patient were reviewed by me and considered in my medical decision making (see chart for details).      Assessment and plan Scalp abrasion 75 year old female presents to the emergency department after a mechanical fall.  Patient was hypertensive at triage but vital signs otherwise reassuring.  Patient's scalp abrasion was superficial in nature and well approximated with no active bleeding on exam.  CT head and CT cervical spine showed no evidence of skull fracture, intracranial bleed or fracture of the cervical spine.  Tylenol was recommended for discomfort.  Patient reported that her tetanus status was up-to-date.  Return  precautions were given to return with new or worsening symptoms.     ____________________________________________  FINAL CLINICAL IMPRESSION(S) / ED DIAGNOSES  Final diagnoses:  Fall, initial encounter  Abrasion of scalp, initial encounter      NEW MEDICATIONS STARTED DURING THIS VISIT:  ED Discharge Orders     None           This chart was dictated using voice recognition software/Dragon. Despite best efforts to proofread, errors can occur which can change the meaning. Any change was purely unintentional.     Orvil Feil, PA-C 10/12/20 Phoebe Perch, MD 10/12/20 [phone]

## 2020-10-12 NOTE — ED Triage Notes (Addendum)
Pt states that she was trying to sit down today and missed the chair. No LOC, no thinners. Pt states this occurred about 2 hours ago. Pt has posterior hematoma.

## 2020-10-12 NOTE — ED Notes (Signed)
See triage note presents s/p fall  Missed her chair  Hitting head  Hematoma noted

## 2020-10-12 NOTE — ED Notes (Signed)
Pt ambulatory to restroom and back to bed, steady gait, assisted by staff x1. Stretcher locked in low position with side rails up x2, call light and personal belongings in reach. Denies further needs at this time.

## 2020-12-07 IMAGING — CT CT ELBOW*R* W/O CM
3 of 4 series · 13 of 35 positions shown, 16 images · non-contrast
Comparison: Radiograph 12/30/2019

CLINICAL DATA: Mechanical fall, right elbow injury

EXAM:
CT OF THE LOWER RIGHT EXTREMITY WITHOUT CONTRAST
TECHNIQUE: Multidetector CT imaging of the right lower extremity was performed
according to the standard protocol.

[Series 5: cor bone · coronal · 0.22mm/px · 1 of 85 slices shown]
[im 43/85  bone]
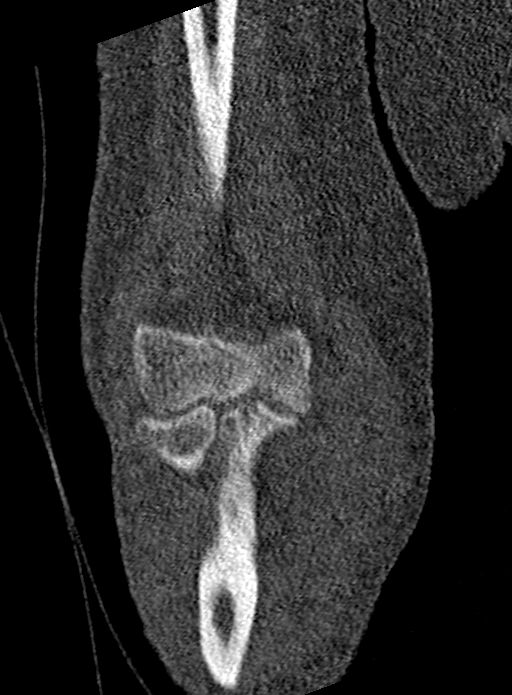

[Series 6: axial st · axial · 0.22mm/px · z∈[-798,-678]mm · 7 of 152 slices shown, 9 images]
[im 12/152  soft-tissue]
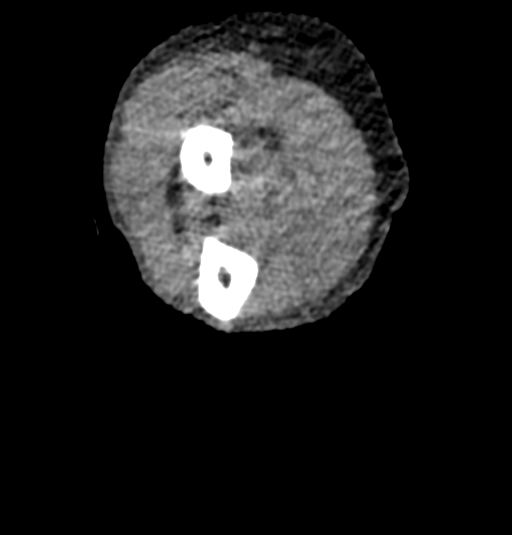
[im 12/152  bone]
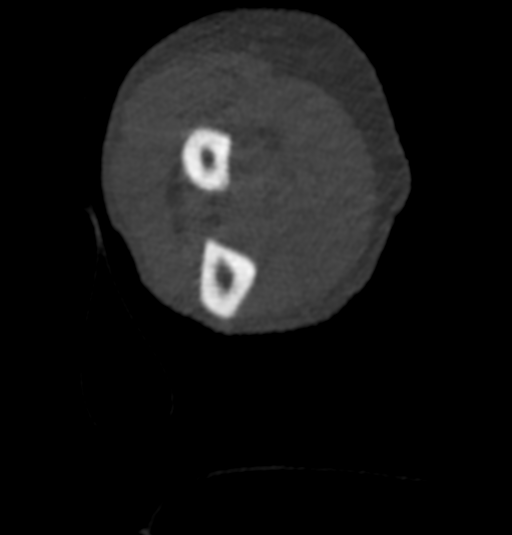
[im 35/152  bone]
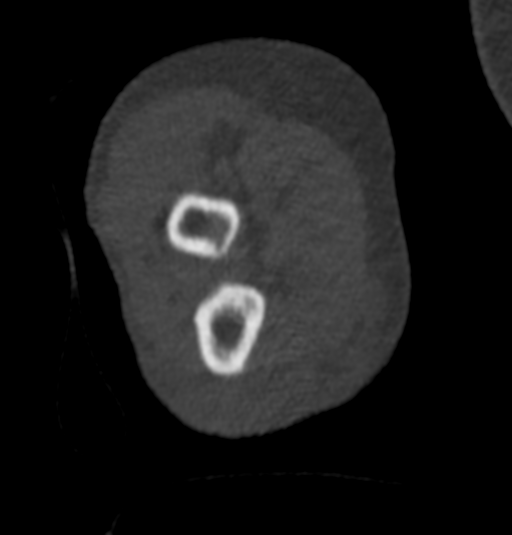
[im 59/152  bone]
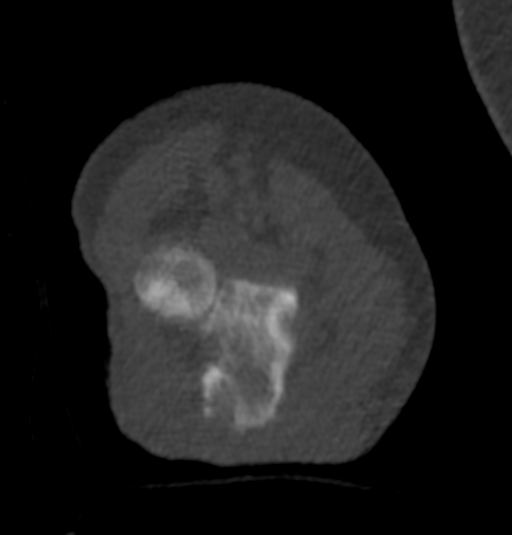
[im 82/152  bone]
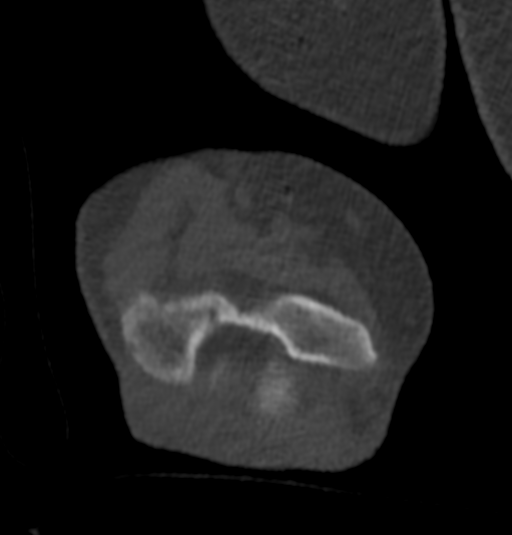
[im 93/152  soft-tissue]
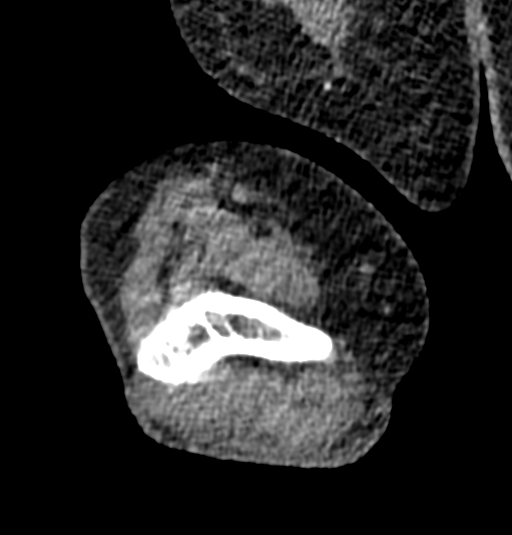
[im 93/152  bone]
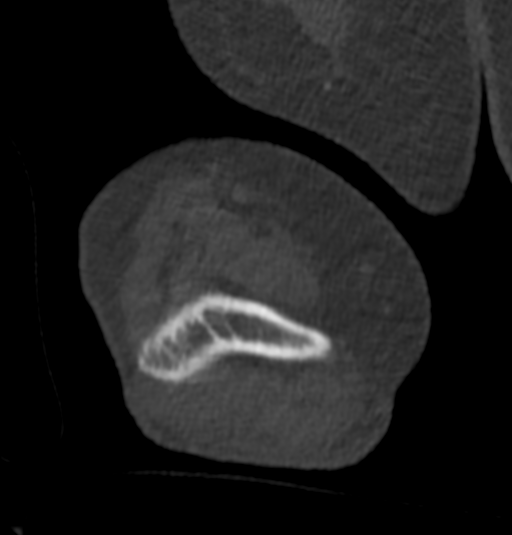
[im 117/152  bone]
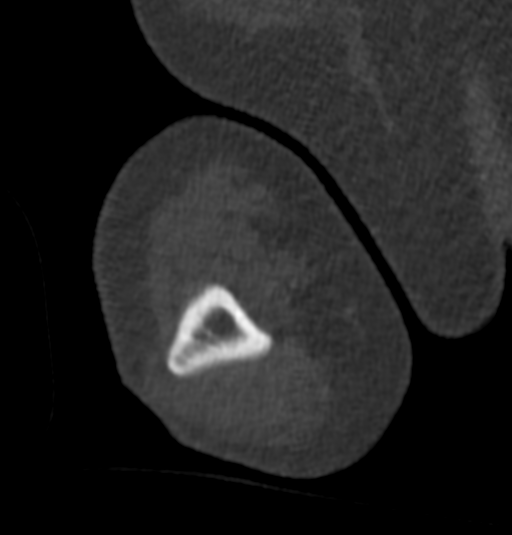
[im 140/152  bone]
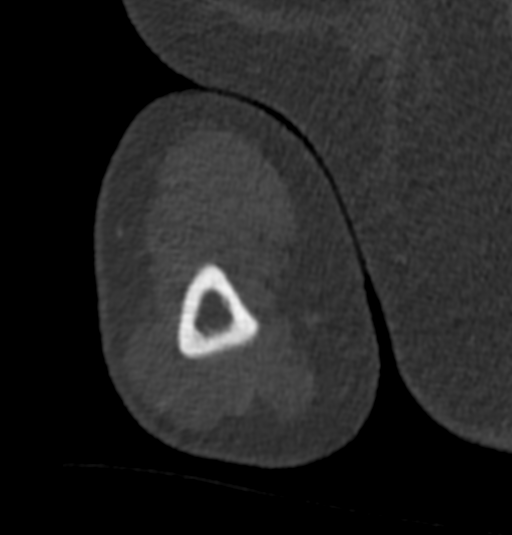

[Series 9: sag st · sagittal · 0.23mm/px · 5 of 67 slices shown, 6 images]
[im 23/67  bone]
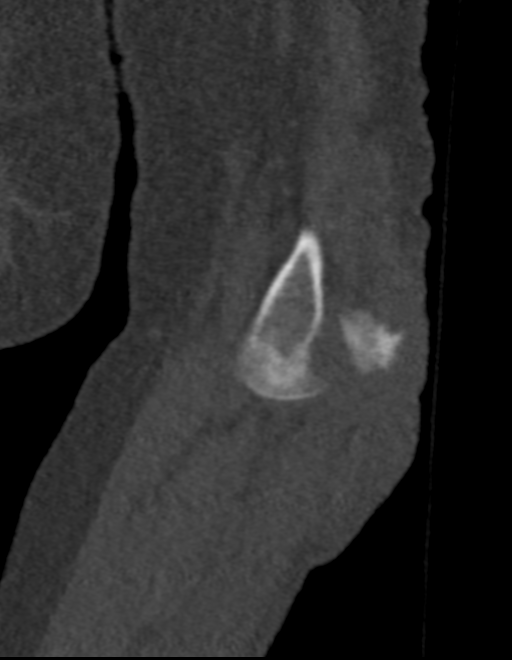
[im 28/67  bone]
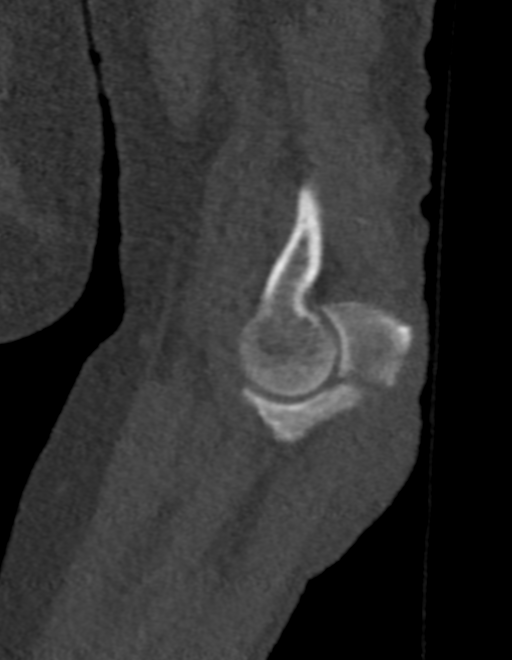
[im 34/67  soft-tissue]
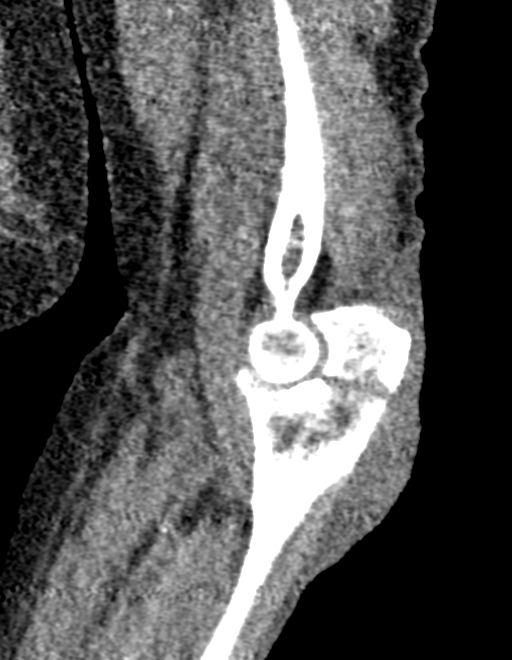
[im 34/67  bone]
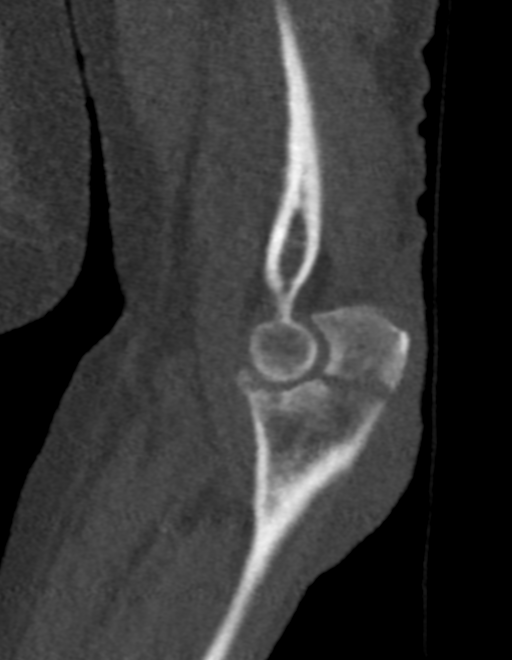
[im 39/67  bone]
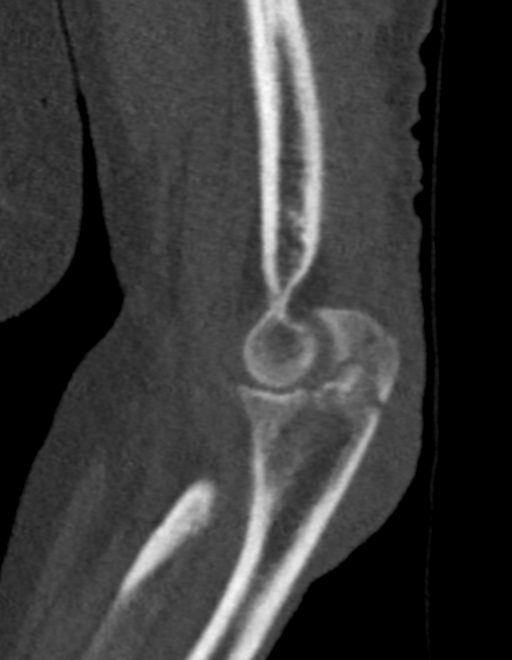
[im 45/67  bone]
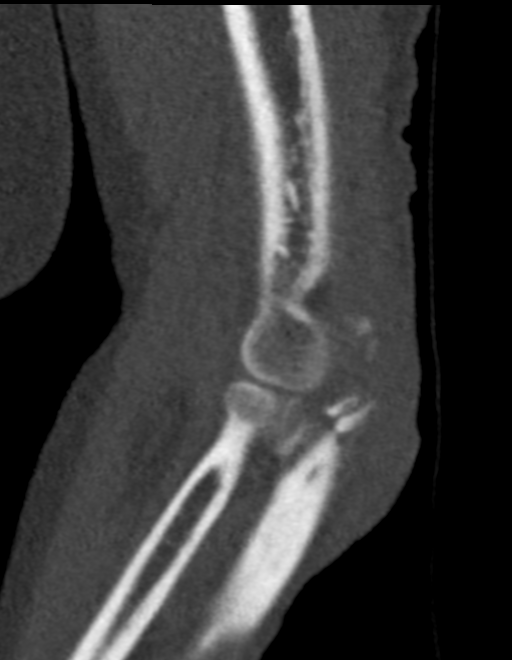

[13 of 35 positions shown; findings below may reference images not displayed]

FINDINGS: Bones/Joint/Cartilage

Comminuted displaced intra-articular fracture of the olecranon
extending into the trochlea without significant retraction of the
olecranon process.

Comminuted fracture involving the coronoid process extending into
the trochlea as well.

There is a minimally displaced fracture of the volar aspect of the
radial head.

Minimally impacted fracture of the radial head neck junction.

Distal humerus is intact. Elbow alignment otherwise maintained.
Background of mild degenerative changes at the elbow. Small elbow
joint effusion is present.

Ligaments

Suboptimally assessed by CT.

Muscles and Tendons

No large intramuscular hematoma. No frank musculotendinous
discontinuity nor partially retracted/torn tendons are seen.

Soft tissues

Focal soft tissue thickening is seen superficial to the olecranon,
likely with some distension of the olecranon bursa. Additional
milder soft tissue swelling seen both medially and laterally about
the elbow.
IMPRESSION: 1. Comminuted displaced intra-articular fracture of the olecranon
extending into the trochlea without significant retraction of the
olecranon process.
2. Comminuted fracture involving the coronoid process extending into
the trochlea as well.
3. Minimally displaced fracture of the volar aspect of the radial
head.
4. Minimally impacted fracture of the radial head neck junction.
5. Small elbow joint effusion.
6. Focal soft tissue thickening superficial to the olecranon, likely
with some distension of the olecranon bursa. Additional milder soft
tissue swelling seen both medially and laterally about the elbow.

## 2020-12-07 IMAGING — CR DG ANKLE 2V *R*
2 series · 2 of 2 positions shown · non-contrast
Comparison: None.

CLINICAL DATA: Fall

EXAM:
RIGHT ANKLE - 2 VIEW

[ankle ap]
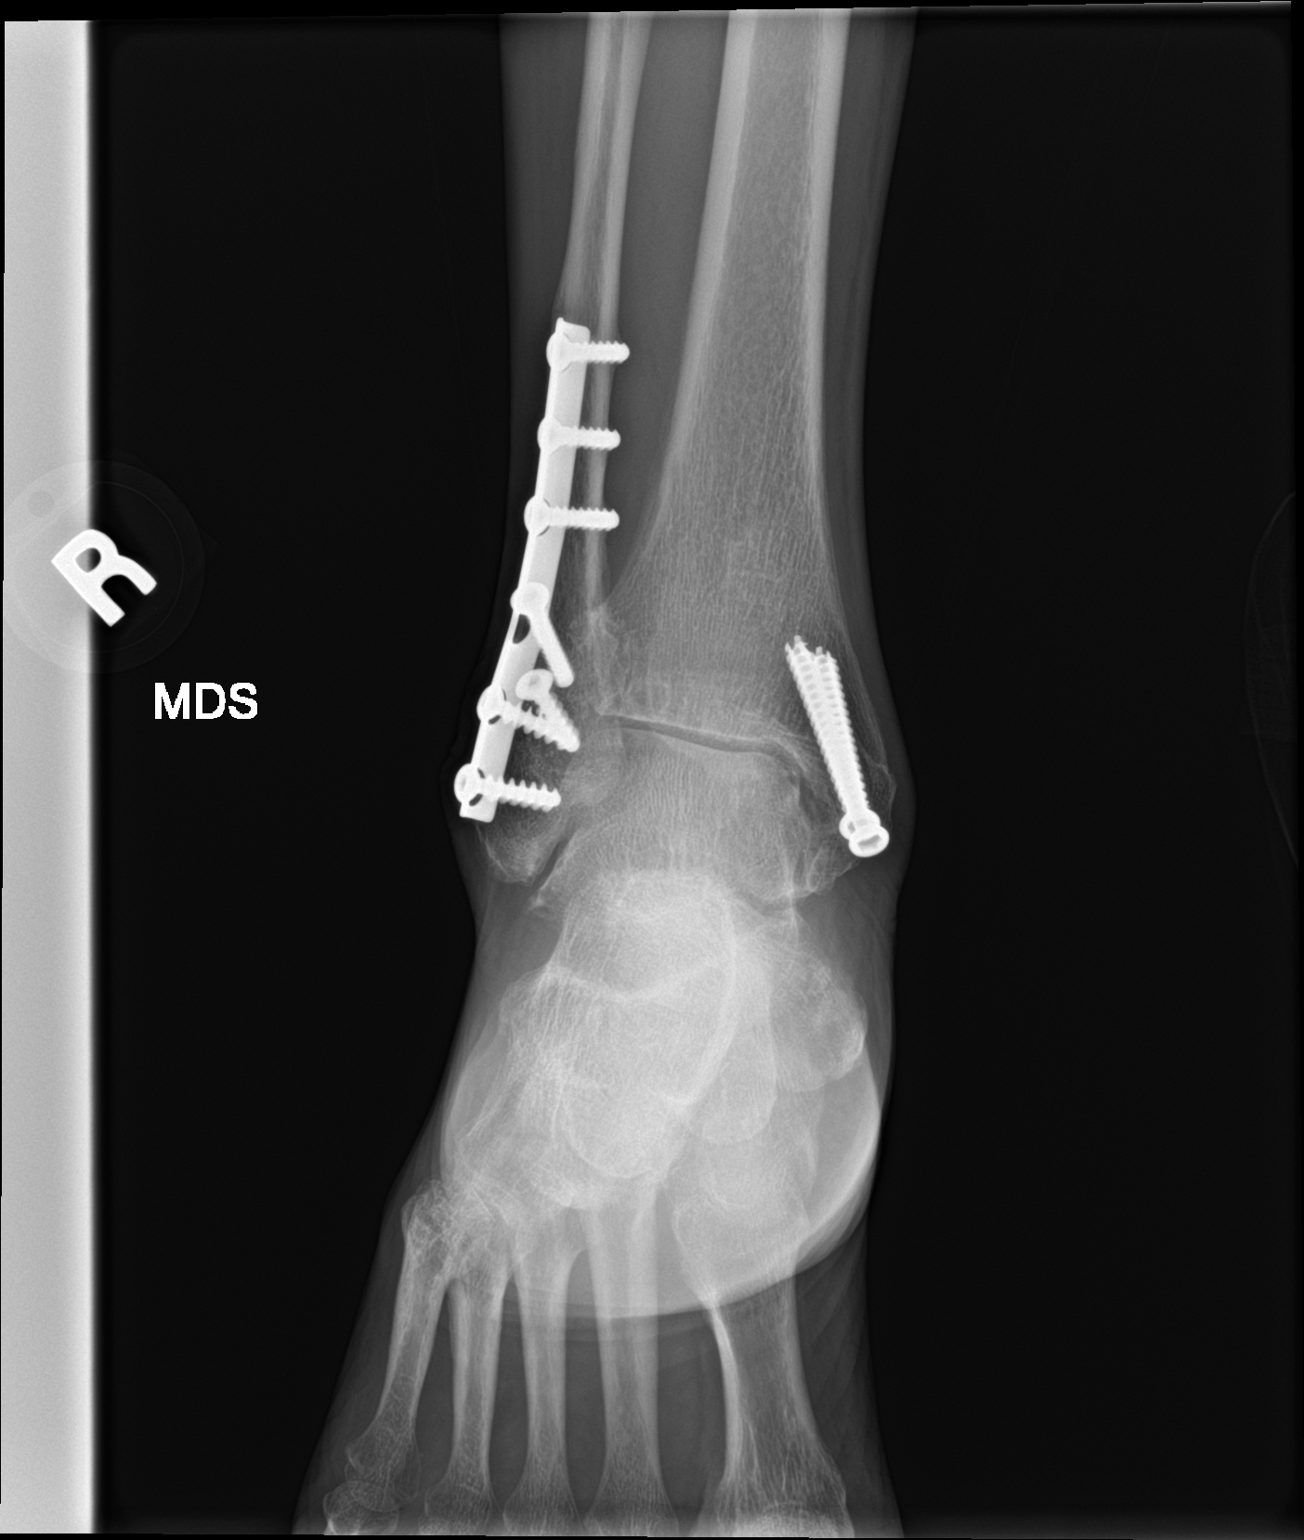

[ankle lat]
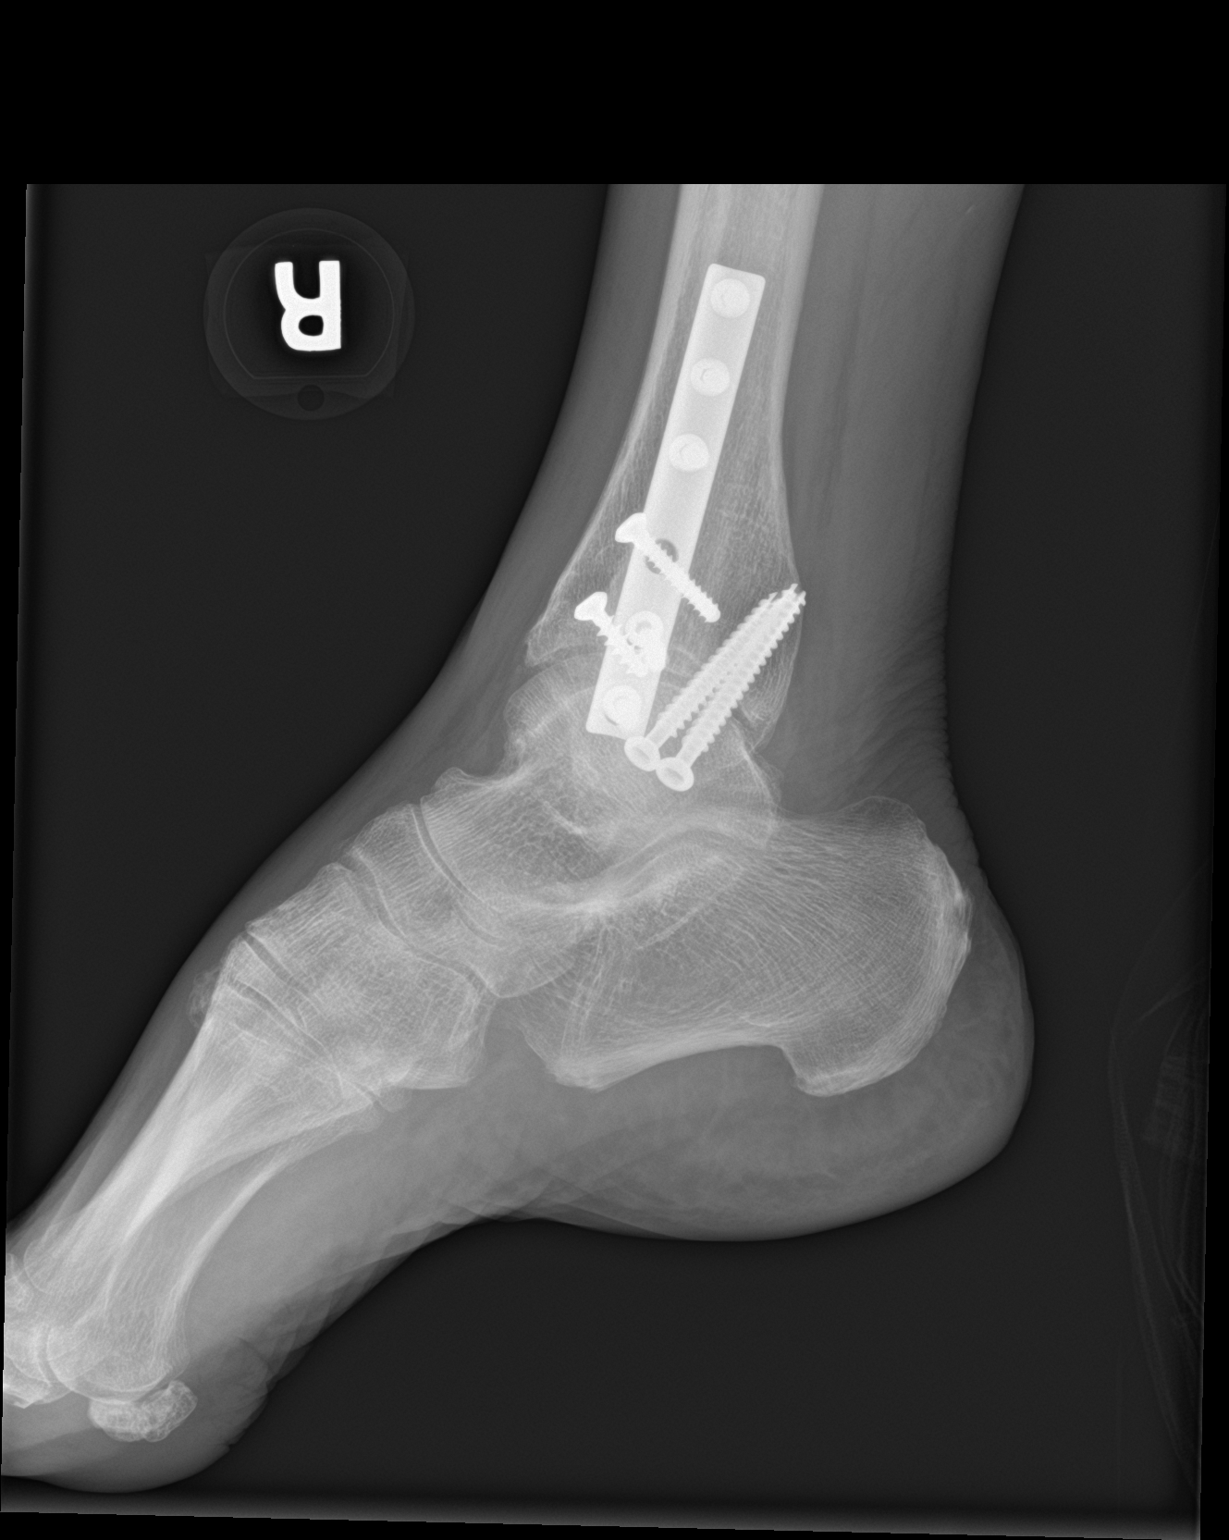

[2 of 2 positions shown; findings below may reference images not displayed]

FINDINGS: Screw fixation of medial malleolus with chronic fracture deformity.
Surgical plate and fixating screws in the distal fibula. 2 fixating
screws within the distal fibula, not associated with the surgical
plate. No acute displaced fracture is seen. Degenerative changes
medially and laterally.
IMPRESSION: 1. No acute osseous abnormality.
2. Postsurgical changes of the distal fibula and medial malleolus.

## 2020-12-07 IMAGING — CR DG ELBOW 2V*R*
2 series · 2 of 2 positions shown · non-contrast
Comparison: None.

CLINICAL DATA: Right elbow abrasion after fall

EXAM:
LEFT ELBOW - 2 VIEW

[elbow ap]
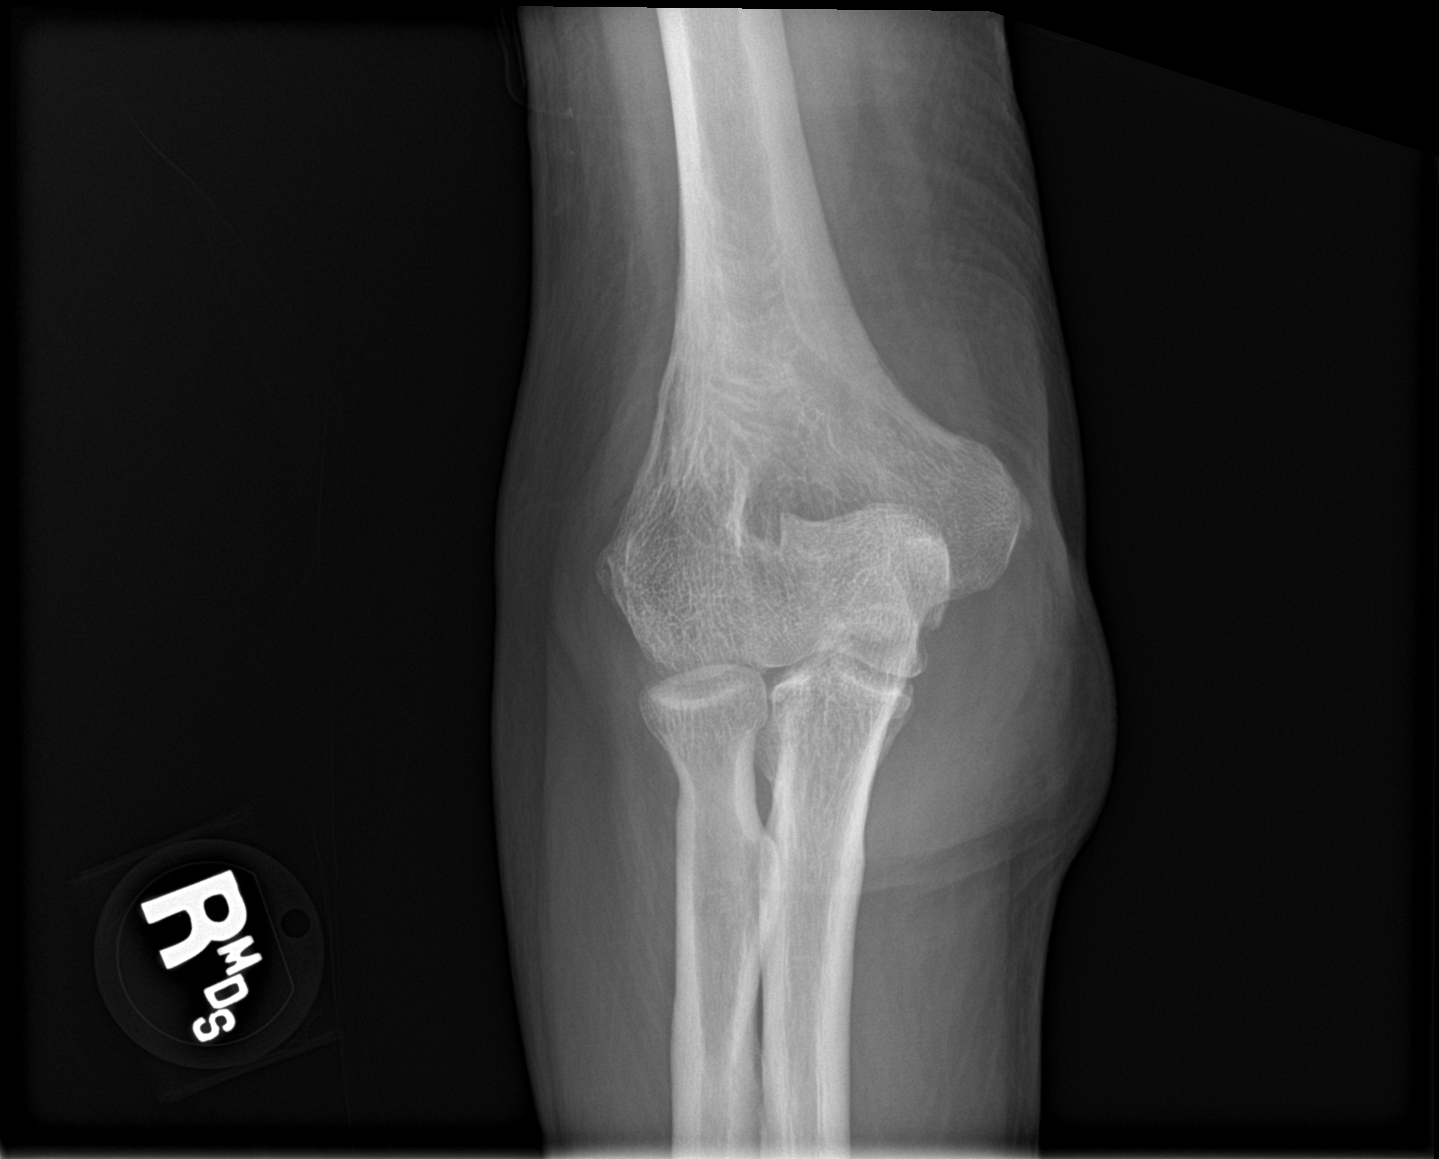

[elbow lat]
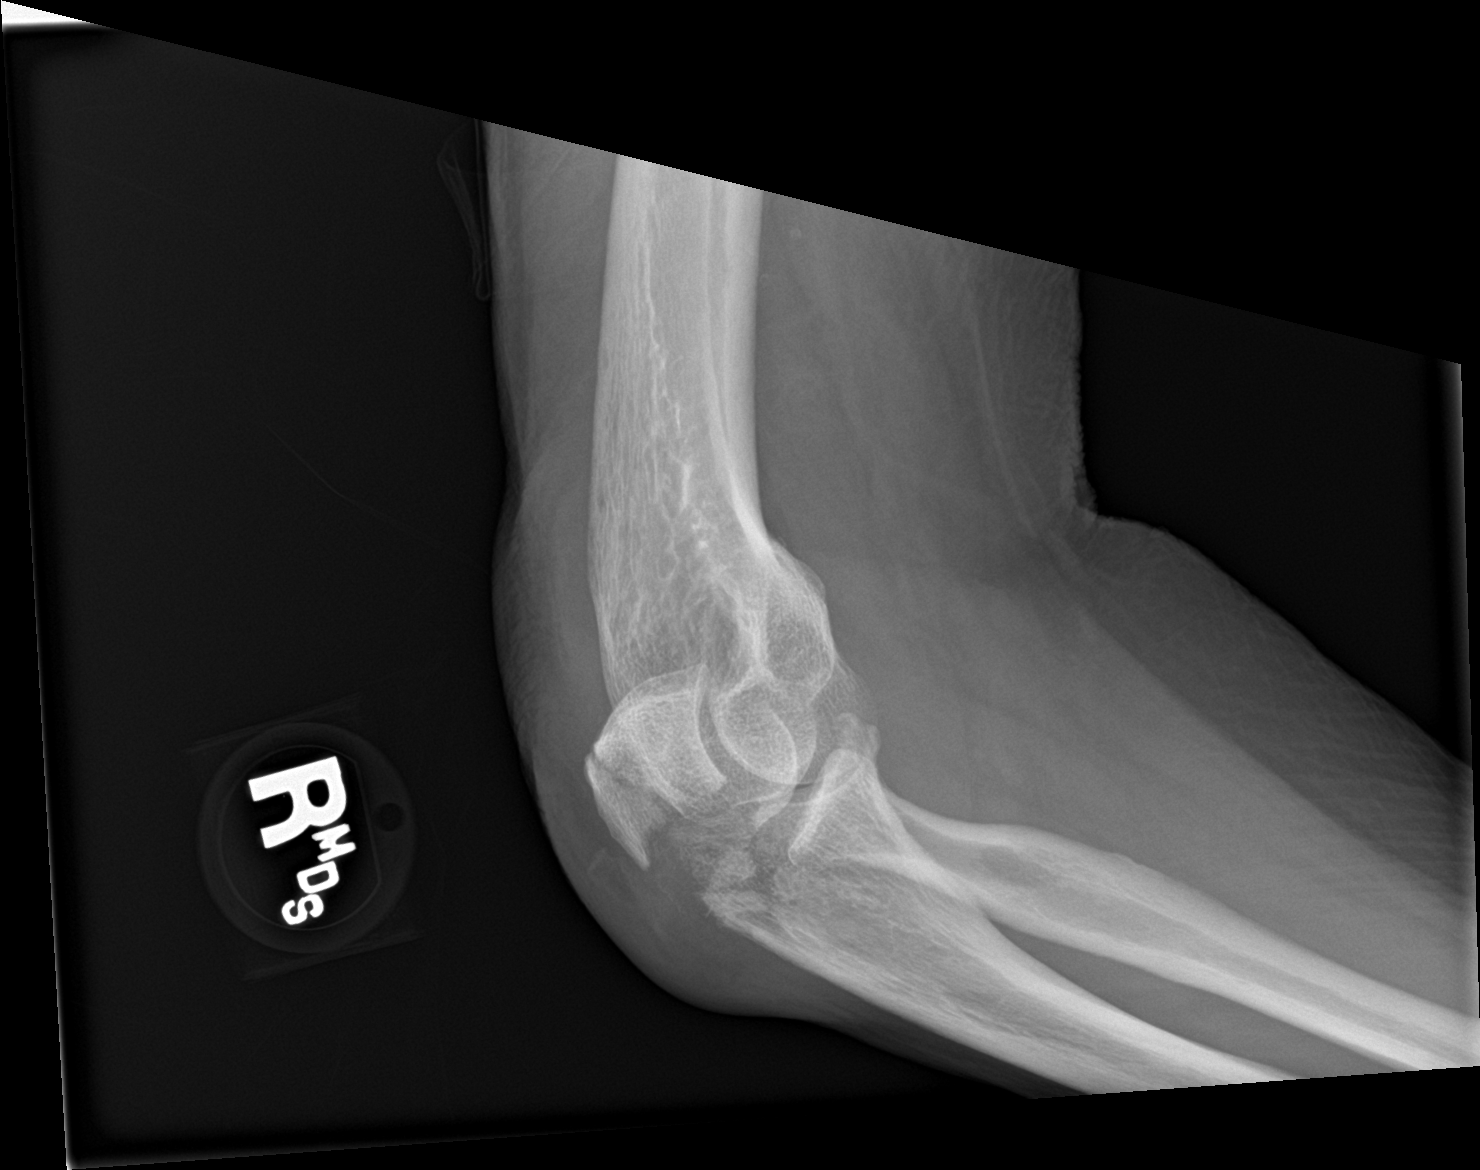

[2 of 2 positions shown; findings below may reference images not displayed]

FINDINGS: Acute comminuted fracture of the olecranon with up to 1.6 cm of
distraction of the dominant fracture fragment. No ulnotrochlear
dislocation. Radiocapitellar alignment is maintained. No evidence of
radial head fracture. Elbow joint hemarthrosis is suspected although
not well evaluated secondary to obliquity on the lateral view. There
is prominent soft tissue swelling at the posterior elbow.
IMPRESSION: 1. Acute comminuted fracture of the olecranon process of the
proximal right ulna with up to 1.6 cm of distraction of the dominant
fracture fragment. No dislocation.
2. Posterior elbow soft tissue swelling.

## 2020-12-07 IMAGING — CT CT HEAD W/O CM
3 series · 14 of 47 positions shown, 16 images · non-contrast
Comparison: CT head/cervical spine 10/24/2019.

CLINICAL DATA: Head trauma, minor. Neck trauma. Additional history
provided: Witnessed fall, abrasion to right elbow and bilateral
knees.

EXAM:
CT HEAD WITHOUT CONTRAST
CT CERVICAL SPINE WITHOUT CONTRAST
TECHNIQUE: Multidetector CT imaging of the head and cervical spine was
performed following the standard protocol without intravenous
contrast. Multiplanar CT image reconstructions of the cervical spine
were also generated.

[Series 2: head wo · axial · 0.47mm/px · z∈[-172,-37]mm · 8 of 33 slices shown, 10 images]
[im 3/33  brain]
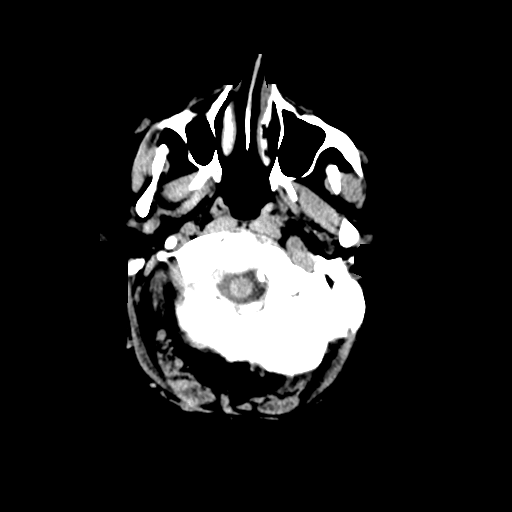
[im 3/33  bone]
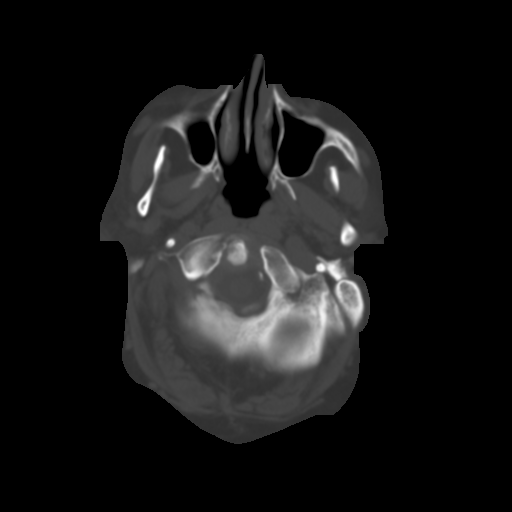
[im 7/33  brain]
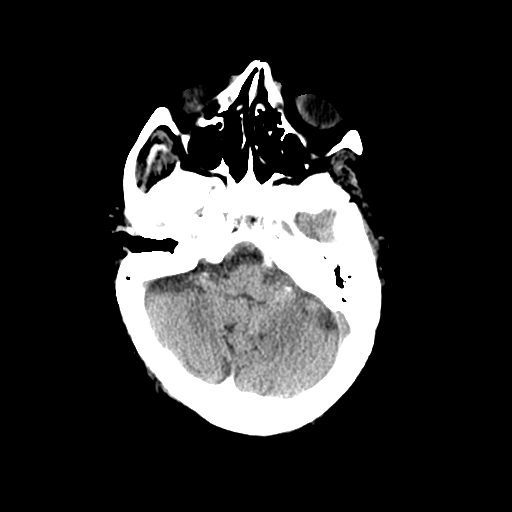
[im 10/33  brain]
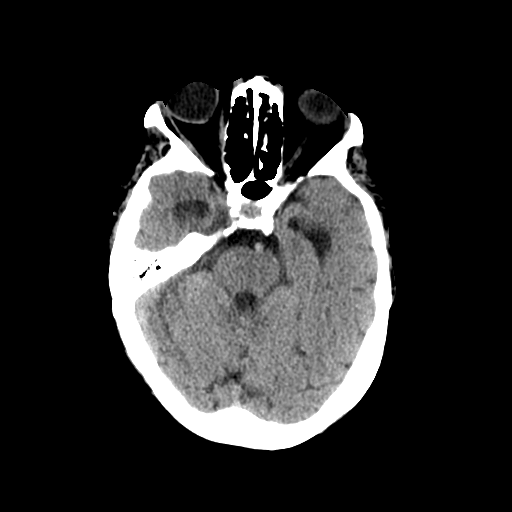
[im 15/33  brain]
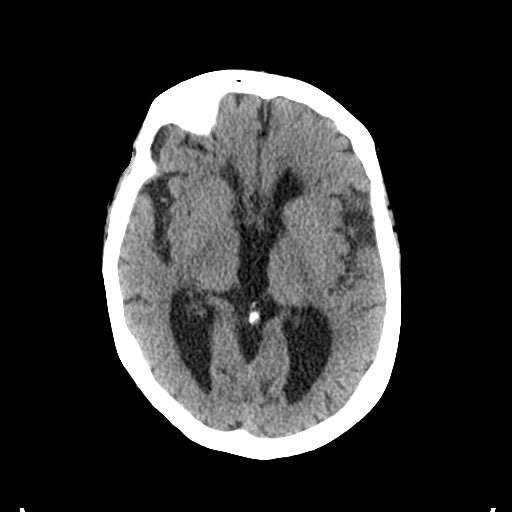
[im 18/33  brain]
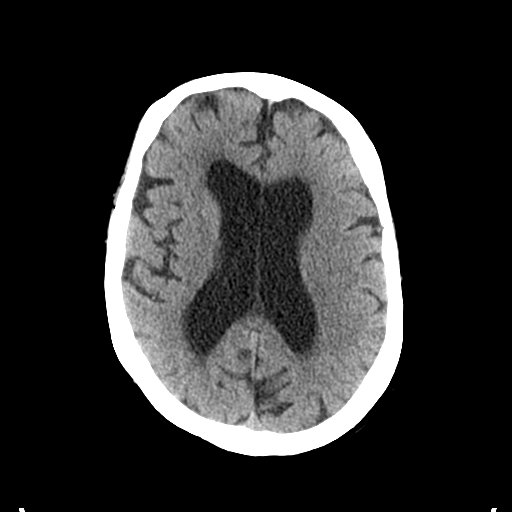
[im 18/33  bone]
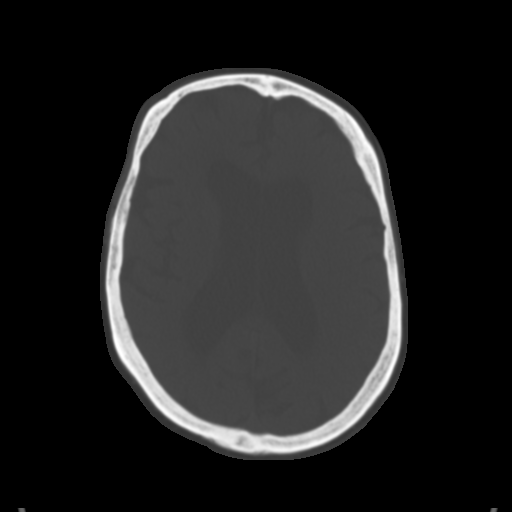
[im 23/33  brain]
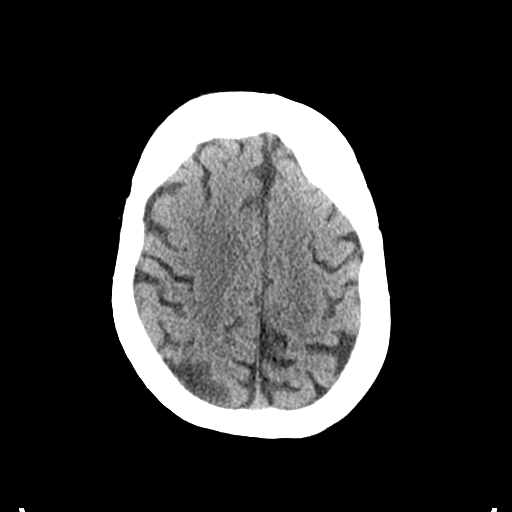
[im 26/33  brain]
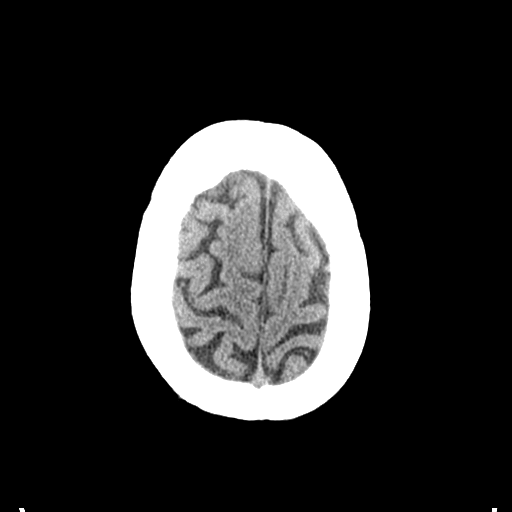
[im 30/33  brain]
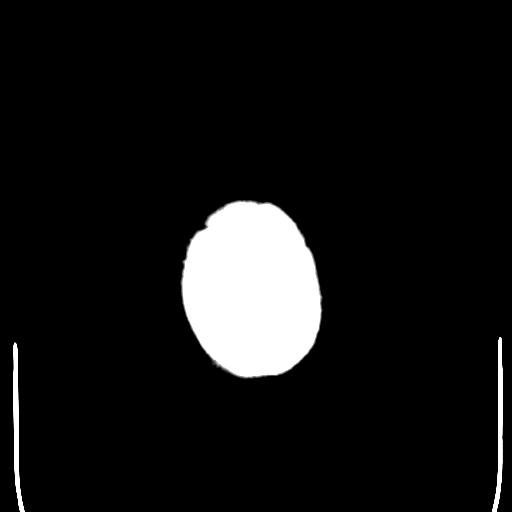

[Series 4: coronal soft tissue · coronal · 0.31mm/px · 3 of 68 slices shown]
[im 24/68  brain]
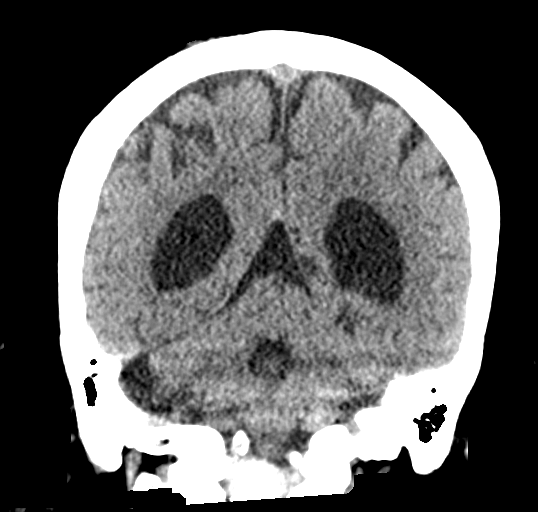
[im 31/68  brain]
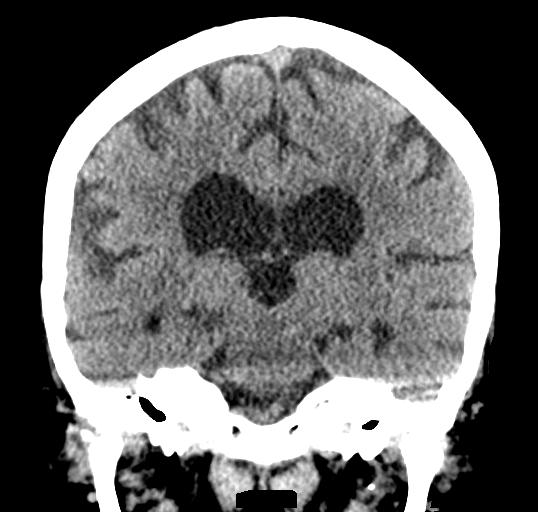
[im 37/68  brain]
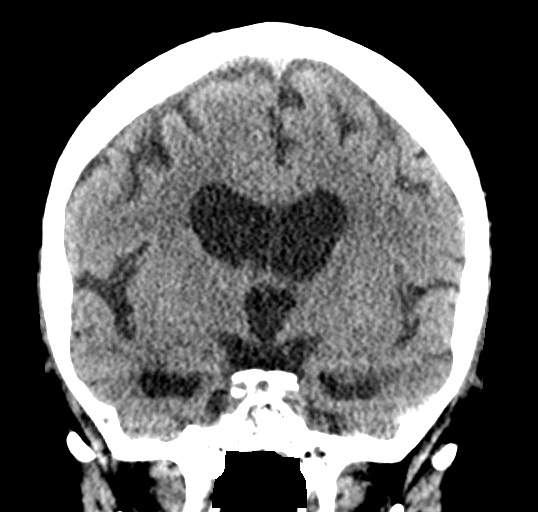

[Series 5: sagittal soft tissue · sagittal · 0.31mm/px · 3 of 56 slices shown]
[im 19/56  brain]
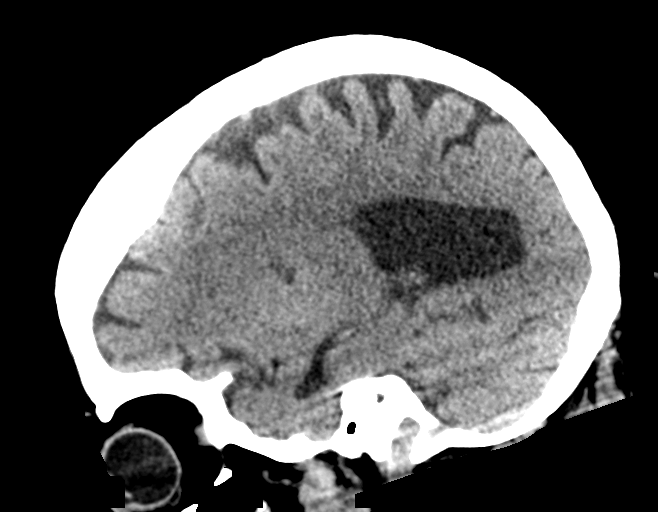
[im 28/56  brain]
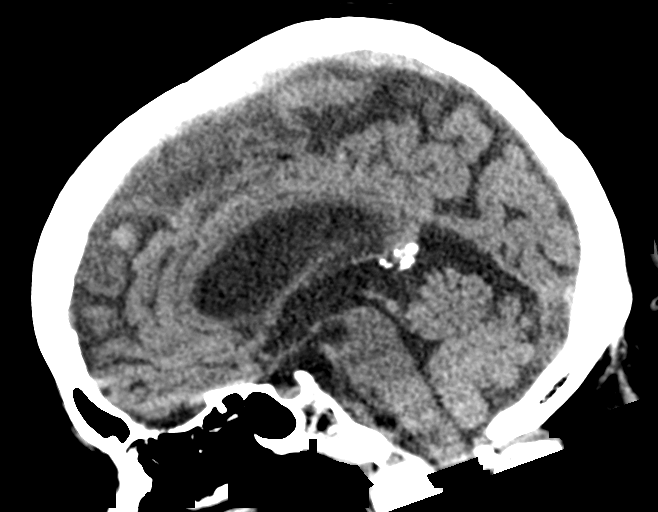
[im 37/56  brain]
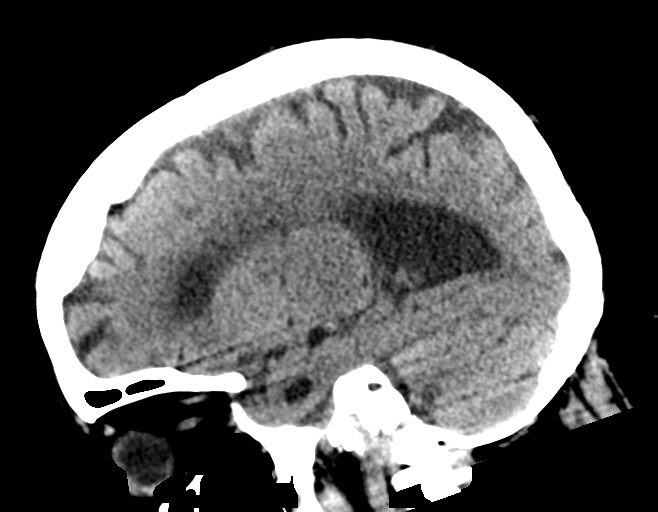

[14 of 47 positions shown; findings below may reference images not displayed]

FINDINGS: CT HEAD FINDINGS

Brain:

Mild-to-moderate cerebral atrophy.

Redemonstrated chronic lacunar infarcts versus prominent
perivascular spaces within the bilateral basal ganglia.

There is no acute intracranial hemorrhage.

No demarcated cortical infarct.

No extra-axial fluid collection.

No evidence of intracranial mass.

No midline shift.

Vascular: No hyperdense vessel.  Atherosclerotic calcifications.

Skull: Normal. Negative for fracture or focal lesion.

Sinuses/Orbits: Visualized orbits show no acute finding. Mild
ethmoid and left maxillary sinus mucosal thickening at the imaged
levels. No significant mastoid effusion.

CT CERVICAL SPINE FINDINGS

Alignment: Cervical dextrocurvature. Reversal of the expected
cervical lordosis. Mild C5-C6 grade 1 anterolisthesis.

Skull base and vertebrae: The basion-dental and atlanto-dental
intervals are maintained.No evidence of acute fracture to the
cervical spine. Congenital nonunion of the posterior arch of C1.

Soft tissues and spinal canal: No prevertebral fluid or swelling. No
visible canal hematoma.

Disc levels: Cervical spondylosis with multilevel disc space
narrowing, disc bulges, posterior disc osteophytes, uncovertebral
hypertrophy and facet arthrosis. There is fusion across the C4-C5
disc space and facet joints. Multilevel spinal canal stenosis. Most
notably, a posterior disc osteophyte complex at C3-C4 contributes to
likely moderate/severe spinal canal stenosis.

Upper chest: No consolidation within the imaged lung apices. No
visible pneumothorax.
IMPRESSION: CT head:

1. No evidence of acute intracranial abnormality.
2. Mild-to-moderate cerebral atrophy.
3. Redemonstrated chronic lacunar infarcts versus prominent
perivascular spaces within the bilateral basal ganglia.

CT cervical spine:

1. No evidence of acute fracture to the cervical spine.
2. Mild C5-C6 grade 1 anterolisthesis.
3. Nonspecific reversal of the expected cervical lordosis. Cervical
dextrocurvature.
4. Cervical spondylosis with multilevel spinal canal stenosis as
described. Most notably, a posterior disc osteophyte complex at
C3-C4 contributes to likely moderate/severe spinal canal stenosis.

## 2020-12-07 IMAGING — CT CT CERVICAL SPINE W/O CM
3 of 4 series · 10 of 33 positions shown, 11 images · non-contrast
Comparison: CT head/cervical spine 10/24/2019.

CLINICAL DATA: Head trauma, minor. Neck trauma. Additional history
provided: Witnessed fall, abrasion to right elbow and bilateral
knees.

EXAM:
CT HEAD WITHOUT CONTRAST
CT CERVICAL SPINE WITHOUT CONTRAST
TECHNIQUE: Multidetector CT imaging of the head and cervical spine was
performed following the standard protocol without intravenous
contrast. Multiplanar CT image reconstructions of the cervical spine
were also generated.

[Series 6: sagittal bone · sagittal · 0.21mm/px · 5 of 49 slices shown]
[im 17/49  bone]
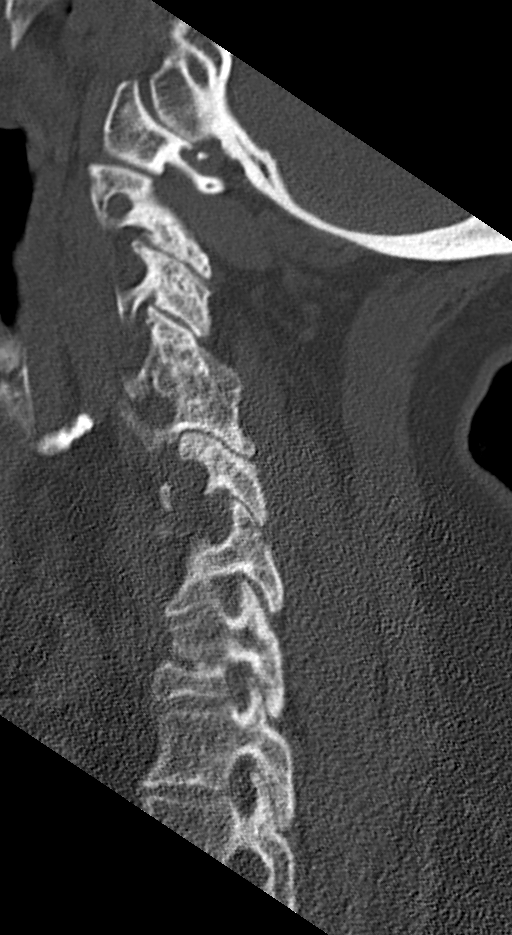
[im 21/49  bone]
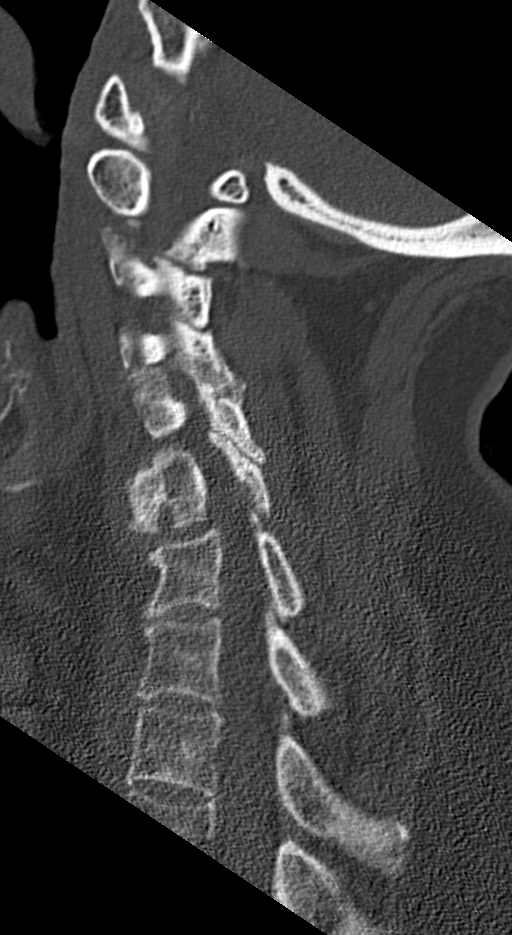
[im 25/49  bone]
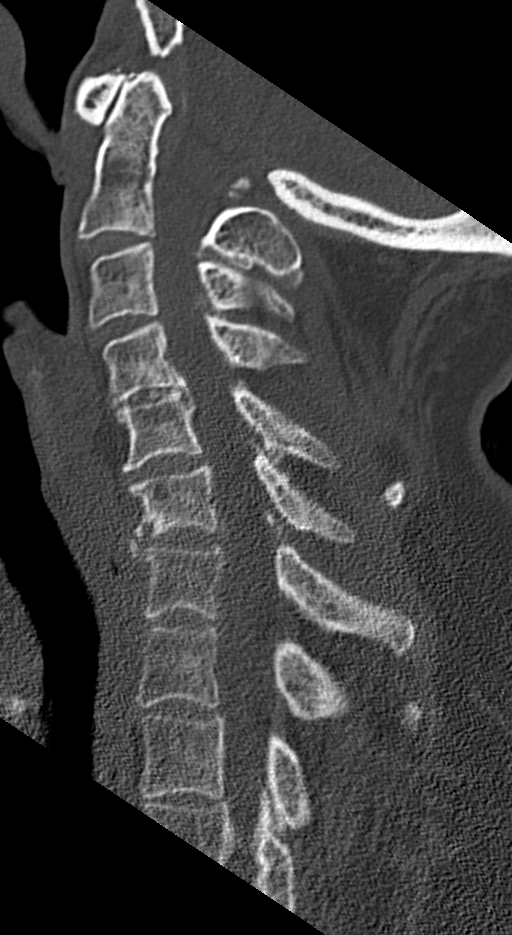
[im 29/49  bone]
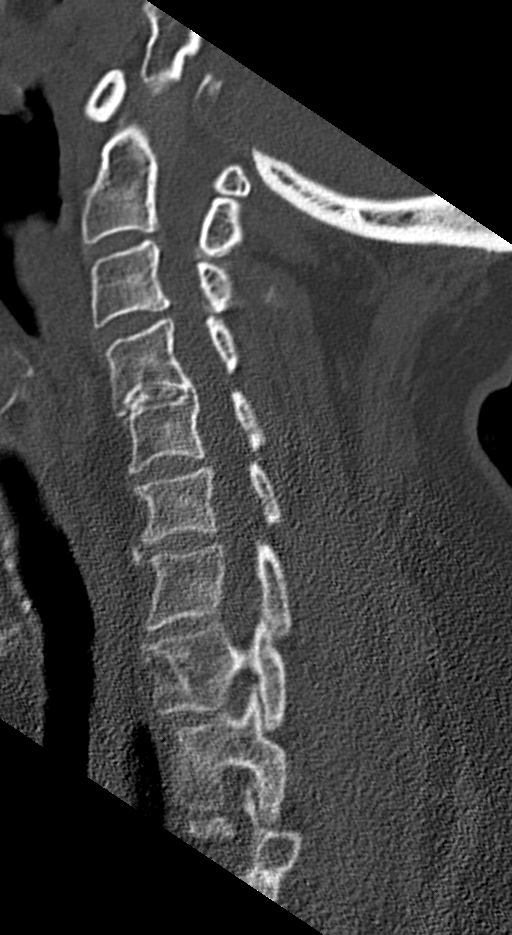
[im 33/49  bone]
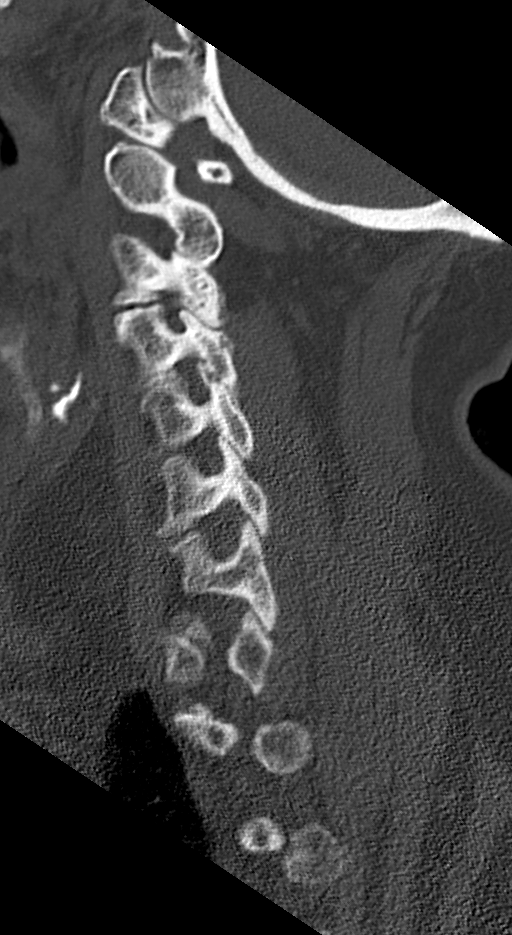

[Series 7: coronal bone · coronal · 0.19mm/px · 3 of 55 slices shown]
[im 14/55  bone]
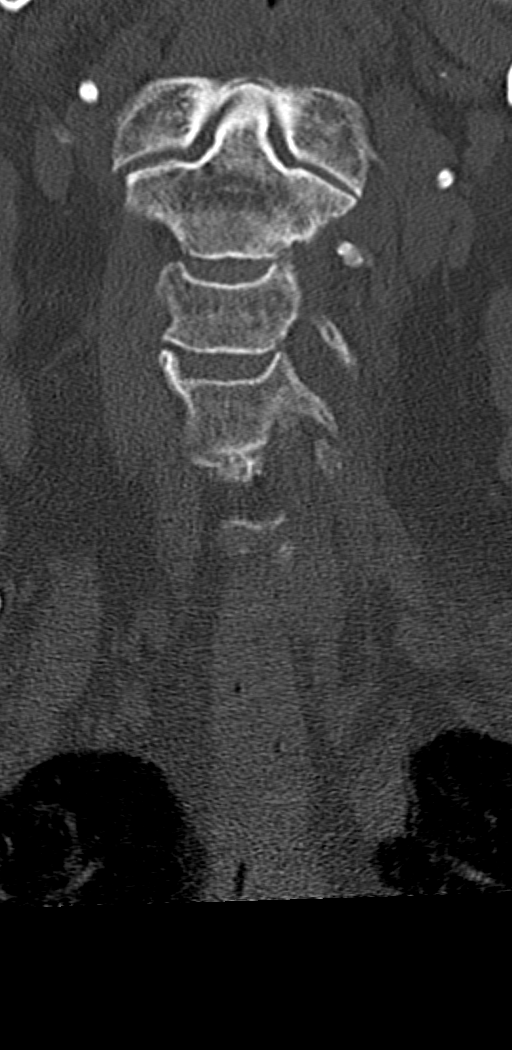
[im 23/55  bone]
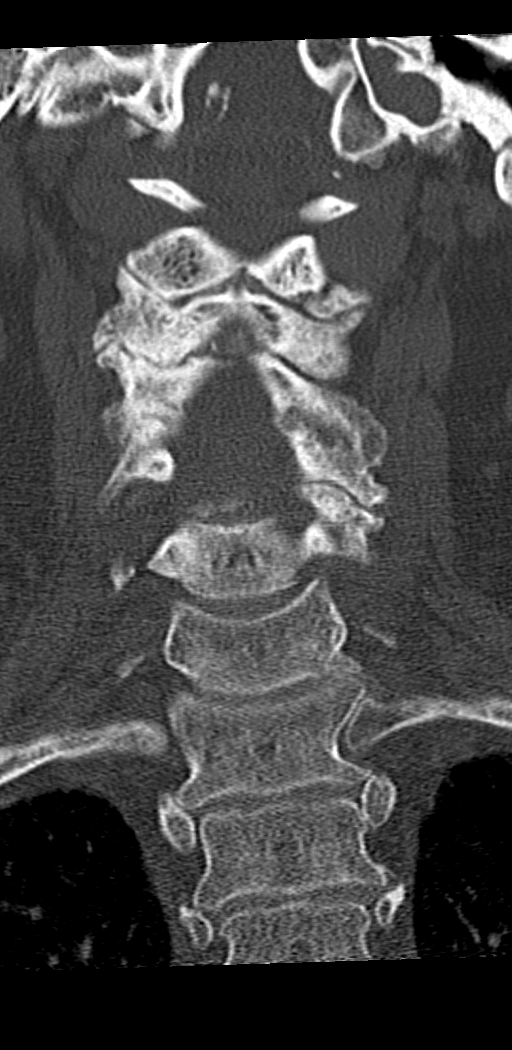
[im 32/55  bone]
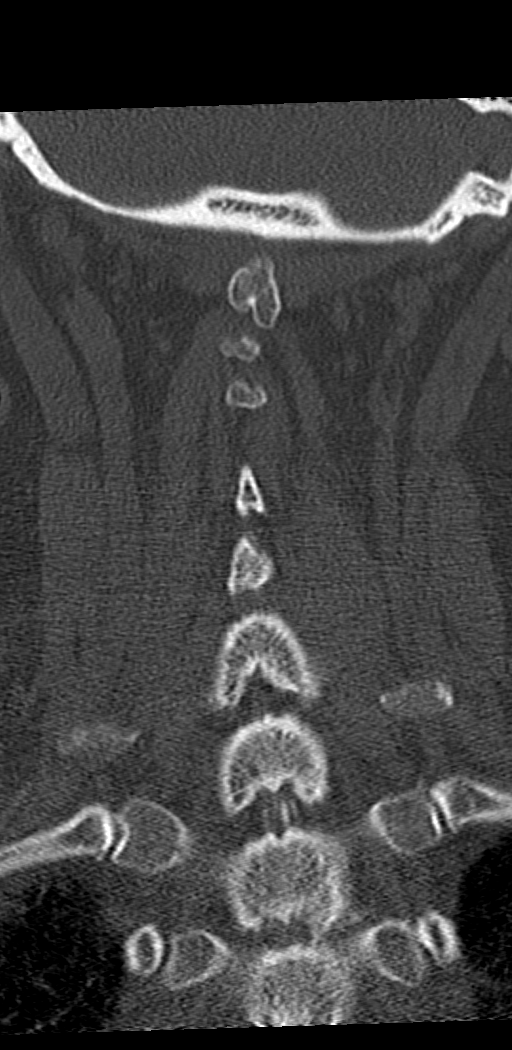

[Series 8: orthogonal bone · axial · 0.19mm/px · z∈[-304,-234]mm · 2 of 100 slices shown, 3 images]
[im 29/100  soft-tissue]
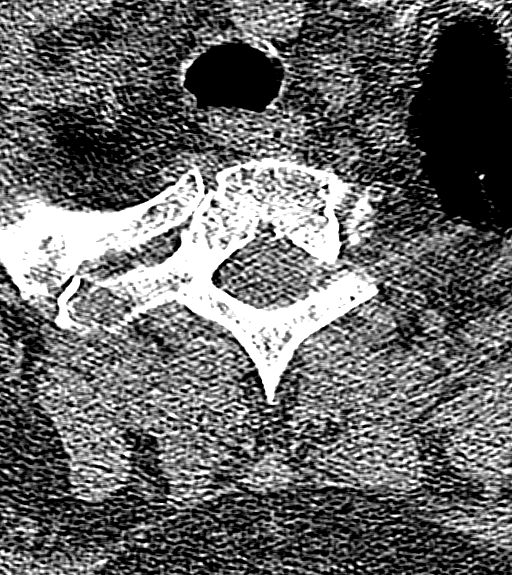
[im 29/100  bone]
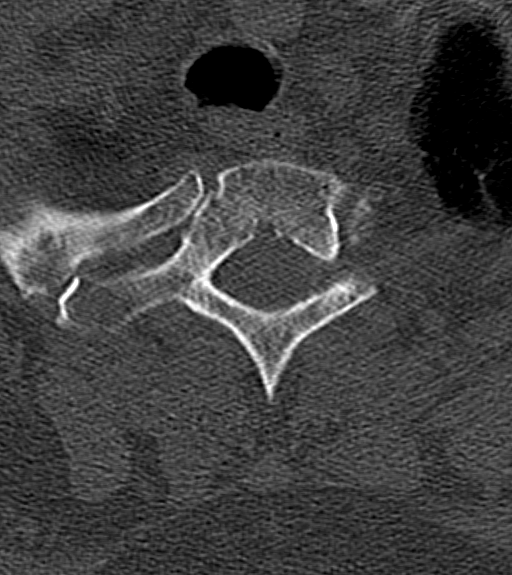
[im 71/100  bone]
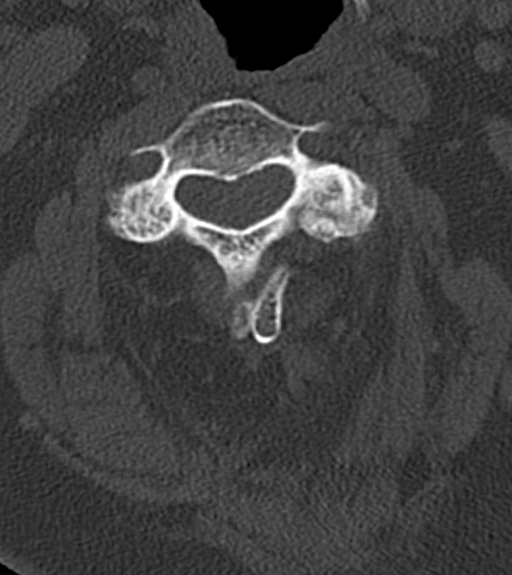

[10 of 33 positions shown; findings below may reference images not displayed]

FINDINGS: CT HEAD FINDINGS

Brain:

Mild-to-moderate cerebral atrophy.

Redemonstrated chronic lacunar infarcts versus prominent
perivascular spaces within the bilateral basal ganglia.

There is no acute intracranial hemorrhage.

No demarcated cortical infarct.

No extra-axial fluid collection.

No evidence of intracranial mass.

No midline shift.

Vascular: No hyperdense vessel.  Atherosclerotic calcifications.

Skull: Normal. Negative for fracture or focal lesion.

Sinuses/Orbits: Visualized orbits show no acute finding. Mild
ethmoid and left maxillary sinus mucosal thickening at the imaged
levels. No significant mastoid effusion.

CT CERVICAL SPINE FINDINGS

Alignment: Cervical dextrocurvature. Reversal of the expected
cervical lordosis. Mild C5-C6 grade 1 anterolisthesis.

Skull base and vertebrae: The basion-dental and atlanto-dental
intervals are maintained.No evidence of acute fracture to the
cervical spine. Congenital nonunion of the posterior arch of C1.

Soft tissues and spinal canal: No prevertebral fluid or swelling. No
visible canal hematoma.

Disc levels: Cervical spondylosis with multilevel disc space
narrowing, disc bulges, posterior disc osteophytes, uncovertebral
hypertrophy and facet arthrosis. There is fusion across the C4-C5
disc space and facet joints. Multilevel spinal canal stenosis. Most
notably, a posterior disc osteophyte complex at C3-C4 contributes to
likely moderate/severe spinal canal stenosis.

Upper chest: No consolidation within the imaged lung apices. No
visible pneumothorax.
IMPRESSION: CT head:

1. No evidence of acute intracranial abnormality.
2. Mild-to-moderate cerebral atrophy.
3. Redemonstrated chronic lacunar infarcts versus prominent
perivascular spaces within the bilateral basal ganglia.

CT cervical spine:

1. No evidence of acute fracture to the cervical spine.
2. Mild C5-C6 grade 1 anterolisthesis.
3. Nonspecific reversal of the expected cervical lordosis. Cervical
dextrocurvature.
4. Cervical spondylosis with multilevel spinal canal stenosis as
described. Most notably, a posterior disc osteophyte complex at
C3-C4 contributes to likely moderate/severe spinal canal stenosis.

## 2020-12-07 IMAGING — CR DG ELBOW 2V*L*
2 series · 2 of 2 positions shown · non-contrast
Comparison: None.

CLINICAL DATA: Right elbow abrasion after fall

EXAM:
LEFT ELBOW - 2 VIEW

[elbow ap]
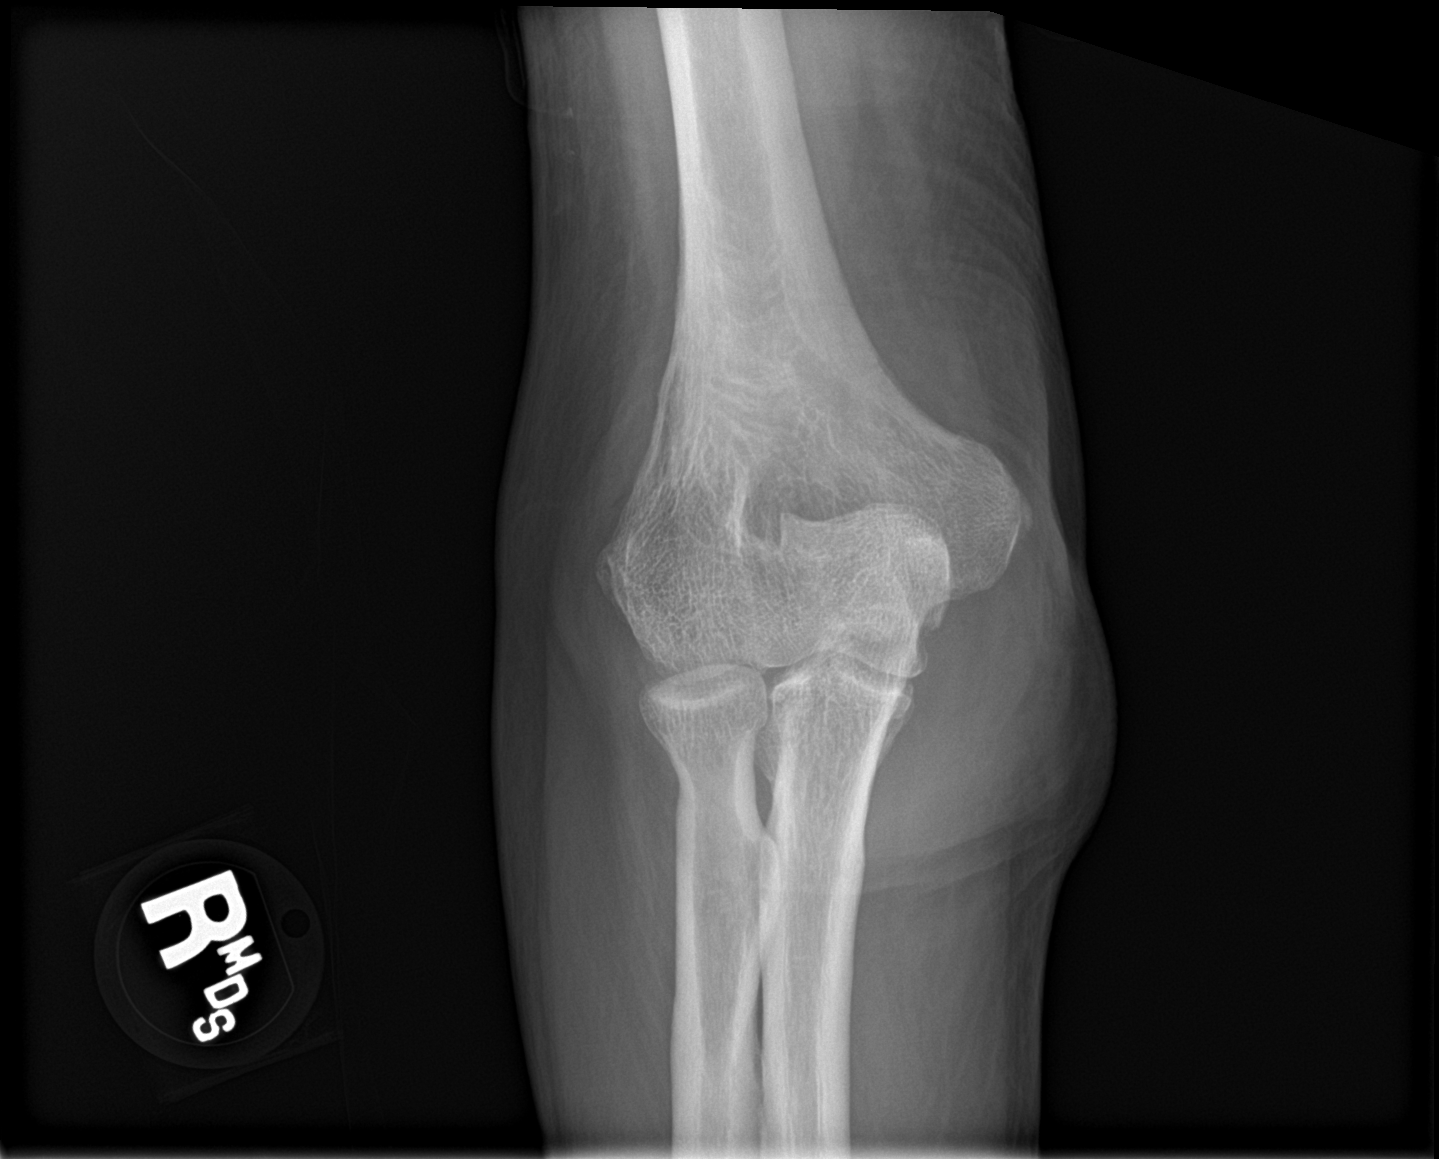

[elbow lat]
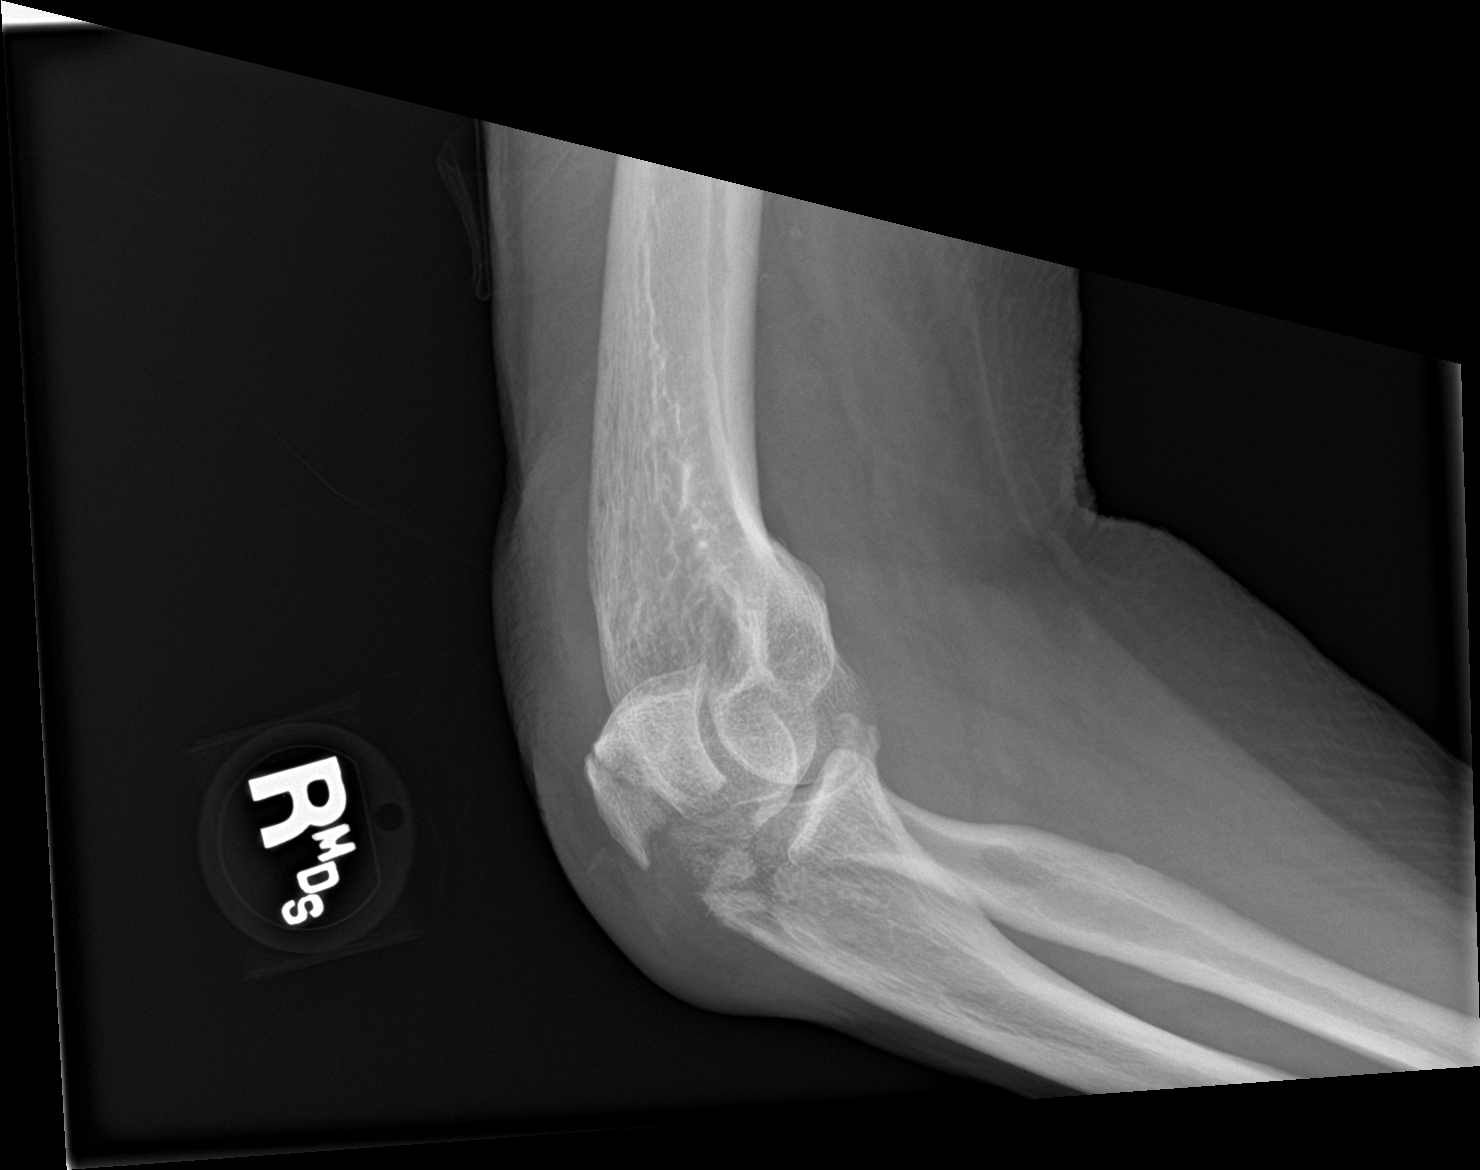

[2 of 2 positions shown; findings below may reference images not displayed]

FINDINGS: Acute comminuted fracture of the olecranon with up to 1.6 cm of
distraction of the dominant fracture fragment. No ulnotrochlear
dislocation. Radiocapitellar alignment is maintained. No evidence of
radial head fracture. Elbow joint hemarthrosis is suspected although
not well evaluated secondary to obliquity on the lateral view. There
is prominent soft tissue swelling at the posterior elbow.
IMPRESSION: 1. Acute comminuted fracture of the olecranon process of the
proximal right ulna with up to 1.6 cm of distraction of the dominant
fracture fragment. No dislocation.
2. Posterior elbow soft tissue swelling.

## 2020-12-07 IMAGING — CR DG CHEST 1V
1 series · 1 of 1 positions shown · non-contrast
Comparison: 09/17/2019

CLINICAL DATA: Fall

EXAM:
CHEST  1 VIEW

[chest ap]
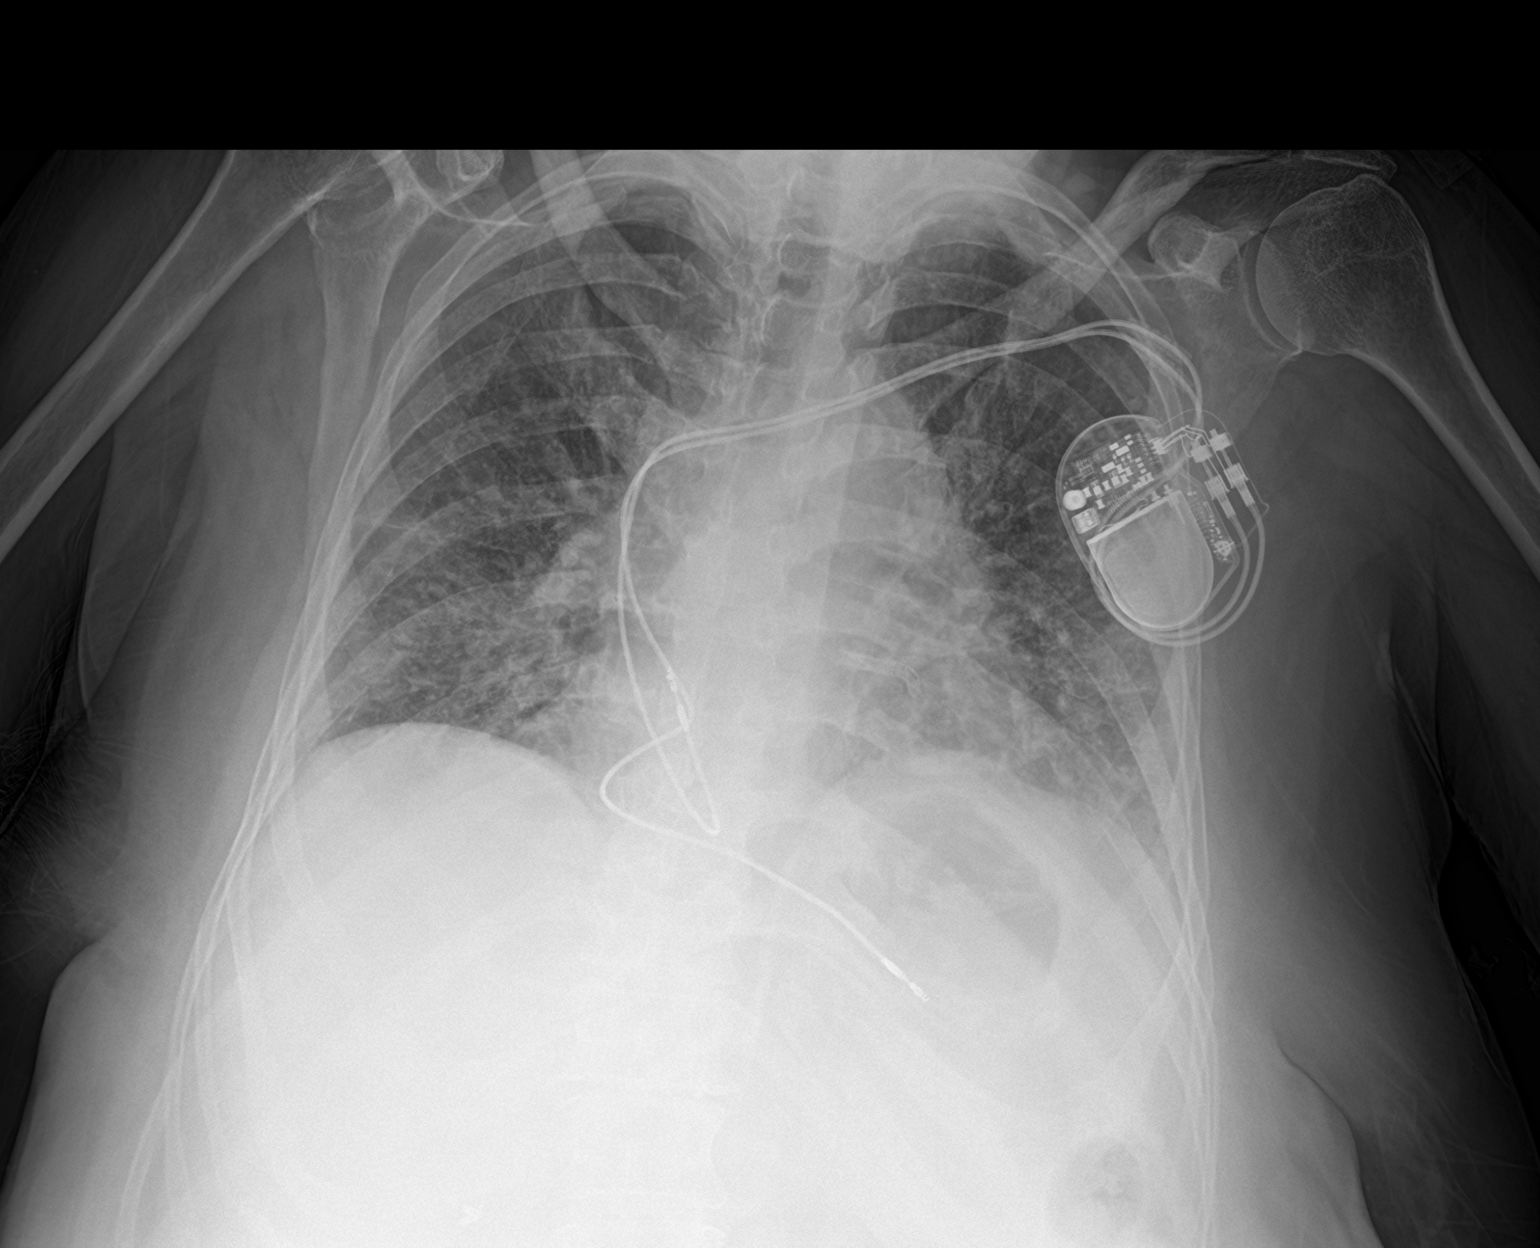

[1 of 1 positions shown; findings below may reference images not displayed]

FINDINGS: Left-sided pacing device as before. Postsurgical changes of the
right humeral head and probably the AC joint. Diminished lung
volumes. Enlarged cardiomediastinal silhouette with mild vascular
congestion and mild diffuse interstitial opacity, probable mild
edema. Negative for pneumothorax. Aortic atherosclerosis.
IMPRESSION: Cardiomegaly with vascular congestion and mild diffuse interstitial
opacity, probable mild edema.

## 2020-12-26 ENCOUNTER — Encounter: Payer: Self-pay | Admitting: *Deleted

## 2020-12-26 ENCOUNTER — Emergency Department: Payer: Medicare Other

## 2020-12-26 ENCOUNTER — Emergency Department
Admission: EM | Admit: 2020-12-26 | Discharge: 2020-12-26 | Disposition: A | Discharge status: Home / Self Care | Payer: Medicare Other | Attending: Emergency Medicine | Admitting: Emergency Medicine

## 2020-12-26 ENCOUNTER — Other Ambulatory Visit: Payer: Self-pay

## 2020-12-26 DIAGNOSIS — Z8616 Personal history of COVID-19: Secondary | ICD-10-CM | POA: Diagnosis not present

## 2020-12-26 DIAGNOSIS — E1122 Type 2 diabetes mellitus with diabetic chronic kidney disease: Secondary | ICD-10-CM | POA: Insufficient documentation

## 2020-12-26 DIAGNOSIS — Z950 Presence of cardiac pacemaker: Secondary | ICD-10-CM | POA: Diagnosis not present

## 2020-12-26 DIAGNOSIS — Z7984 Long term (current) use of oral hypoglycemic drugs: Secondary | ICD-10-CM | POA: Insufficient documentation

## 2020-12-26 DIAGNOSIS — Z20822 Contact with and (suspected) exposure to covid-19: Secondary | ICD-10-CM | POA: Insufficient documentation

## 2020-12-26 DIAGNOSIS — I2510 Atherosclerotic heart disease of native coronary artery without angina pectoris: Secondary | ICD-10-CM | POA: Insufficient documentation

## 2020-12-26 DIAGNOSIS — Z79899 Other long term (current) drug therapy: Secondary | ICD-10-CM | POA: Insufficient documentation

## 2020-12-26 DIAGNOSIS — Z7952 Long term (current) use of systemic steroids: Secondary | ICD-10-CM | POA: Insufficient documentation

## 2020-12-26 DIAGNOSIS — J449 Chronic obstructive pulmonary disease, unspecified: Secondary | ICD-10-CM | POA: Diagnosis not present

## 2020-12-26 DIAGNOSIS — I130 Hypertensive heart and chronic kidney disease with heart failure and stage 1 through stage 4 chronic kidney disease, or unspecified chronic kidney disease: Secondary | ICD-10-CM | POA: Insufficient documentation

## 2020-12-26 DIAGNOSIS — R410 Disorientation, unspecified: Secondary | ICD-10-CM | POA: Insufficient documentation

## 2020-12-26 DIAGNOSIS — I5033 Acute on chronic diastolic (congestive) heart failure: Secondary | ICD-10-CM | POA: Diagnosis not present

## 2020-12-26 DIAGNOSIS — F17210 Nicotine dependence, cigarettes, uncomplicated: Secondary | ICD-10-CM | POA: Insufficient documentation

## 2020-12-26 DIAGNOSIS — N1832 Chronic kidney disease, stage 3b: Secondary | ICD-10-CM | POA: Insufficient documentation

## 2020-12-26 DIAGNOSIS — R4182 Altered mental status, unspecified: Secondary | ICD-10-CM | POA: Diagnosis present

## 2020-12-26 LAB — URINALYSIS, COMPLETE (UACMP) WITH MICROSCOPIC
Bacteria, UA: NONE SEEN
Bilirubin Urine: NEGATIVE
Glucose, UA: NEGATIVE mg/dL
Hgb urine dipstick: NEGATIVE
Ketones, ur: NEGATIVE mg/dL
Leukocytes,Ua: NEGATIVE
Nitrite: NEGATIVE
Protein, ur: 100 mg/dL — AB
Specific Gravity, Urine: 1.011 (ref 1.005–1.030)
pH: 7 (ref 5.0–8.0)

## 2020-12-26 LAB — RESP PANEL BY RT-PCR (FLU A&B, COVID) ARPGX2
Influenza A by PCR: NEGATIVE
Influenza B by PCR: NEGATIVE
SARS Coronavirus 2 by RT PCR: NEGATIVE

## 2020-12-26 LAB — CBC
HCT: 37.4 % (ref 36.0–46.0)
Hemoglobin: 12.7 g/dL (ref 12.0–15.0)
MCH: 33.2 pg (ref 26.0–34.0)
MCHC: 34 g/dL (ref 30.0–36.0)
MCV: 97.9 fL (ref 80.0–100.0)
Platelets: 314 10*3/uL (ref 150–400)
RBC: 3.82 MIL/uL — ABNORMAL LOW (ref 3.87–5.11)
RDW: 15.1 % (ref 11.5–15.5)
WBC: 8.5 10*3/uL (ref 4.0–10.5)
nRBC: 0 % (ref 0.0–0.2)

## 2020-12-26 LAB — TROPONIN I (HIGH SENSITIVITY)
Troponin I (High Sensitivity): 19 ng/L — ABNORMAL HIGH (ref <18)
Troponin I (High Sensitivity): 23 ng/L — ABNORMAL HIGH (ref <18)

## 2020-12-26 NOTE — ED Provider Notes (Signed)
[facility] Emergency Department Provider Note  Time seen: 2:32 PM  I have reviewed the triage vital signs and the nursing notes.   HISTORY  Chief Complaint Altered Mental Status   HPI Melanie King is a 75 y.o. female with a past medical history of arthritis, CHF, COPD, diabetes, gastric reflux, hypertension, hyperlipidemia, mild dementia, presents to the emergency department from a nursing facility for altered mental status.  According to report patient lives at her nursing facility has mild dementia at baseline but has been acting more confused today.  Per report patient went to another resident's room and urinated on the floor which is very atypical.  Here the patient is awake alert she is actually oriented to person place and time.  Patient denies any pain, does not feel confused.  Follows commands well.  Does state frequent urination but denies any dysuria.   Past Medical History:  Diagnosis Date   Arthritis    CHF (congestive heart failure) (HCC)    COPD (chronic obstructive pulmonary disease) (HCC)    Coronary arteriosclerosis    DDD (degenerative disc disease), cervical    DDD (degenerative disc disease), lumbar    Depression    Diabetes mellitus without complication (HCC)    GERD (gastroesophageal reflux disease)    Hyperlipidemia    Hypertension    Osteoarthritis    Pacemaker    Tremor     Patient Active Problem List   Diagnosis Date Noted   Malnutrition of moderate degree 08/28/2020   Elevated troponin 08/26/2020   CHF (congestive heart failure) (HCC) 08/26/2020   Carotid stenosis 04/12/2020   Dysphagia 07/19/2019   Acute respiratory failure due to COVID-19 (HCC) 07/16/2019   Respiratory distress    Acute on chronic congestive heart failure (HCC)    Acute respiratory failure with hypoxia (HCC) 07/15/2019   Frequent falls 05/26/2019   Generalized weakness 05/26/2019   Acute kidney injury superimposed on CKD lllb (HCC) 03/21/2019    Subdural hematoma 03/20/2019   Cardiac pacemaker 03/17/2019   Hypothyroidism 03/10/2019   Depression    Acute on chronic diastolic CHF (congestive heart failure) (HCC)    Acute metabolic encephalopathy    Fall    Tobacco abuse    C6 cervical fracture (HCC)    Left hand pain 04/01/2018   Numbness and tingling in left hand 04/01/2018   Left hand weakness 03/05/2018   Confusion 05/12/2017   Seizure (HCC) 05/10/2017   Elevated sedimentation rate 04/04/2017   Elevated C-reactive protein (CRP) 04/04/2017   Chest pain, unspecified 04/02/2017   Bilateral lower extremity pain (Secondary Area of Pain) (R>L) 04/02/2017   Chronic hip pain, bilateral  (Secondary Area of Pain) (R>L) 04/02/2017   Chronic bilateral low back pain with bilateral sciatica [facility] Area of Pain) (R>L) 04/02/2017   Chronic pain syndrome 04/02/2017   Disorder of bone, unspecified 04/02/2017   Other long term (current) drug therapy 04/02/2017   Other specified health status 04/02/2017   Long term current use of opiate analgesic 04/02/2017   Hip pain, bilateral 12/06/2016   Tremor 05/27/2016   Trigger finger of left hand 02/16/2016   Osteoarthritis of both hands 02/16/2016   Angina pectoris (HCC) 01/17/2016   COPD with acute exacerbation (HCC) 01/17/2016   CAD (coronary artery disease) 01/17/2016   HLD (hyperlipidemia) 01/17/2016   Hypertension 01/17/2016   Sick sinus syndrome (HCC) 01/17/2016   Chronic pain 01/17/2016   Personal history of tobacco use, presenting hazards to health 11/16/2015  Type 2 diabetes mellitus without complication, with long-term current use of insulin (HCC) 05/12/2013    Past Surgical History:  Procedure Laterality Date   ABDOMINAL HYSTERECTOMY     BREAST BIOPSY Right 1994   neg cyst removed   CATARACT EXTRACTION W/PHACO Right 08/17/2020   Procedure: CATARACT EXTRACTION PHACO AND INTRAOCULAR LENS PLACEMENT (IOC) RIGHT DIABETIC 8.90 00:52.9;  Surgeon: Galen Manila, MD;  Location:  [facility];  Service: Ophthalmology;  Laterality: Right;  Diabetic   CHOLECYSTECTOMY     EYE SURGERY  1964, 1966, and 1967   bilateral   FOOT SURGERY Right    cellulitis   PACEMAKER INSERTION     PPM GENERATOR CHANGEOUT N/A 02/24/2020   Procedure: PPM GENERATOR CHANGEOUT;  Surgeon: Marcina Millard, MD;  Location: [facility];  Service: Cardiovascular;  Laterality: N/A;   SPINE SURGERY     cyst removed; [facility]    Prior to Admission medications   Medication Sig Start Date End Date Taking? Authorizing Provider  acetaminophen (TYLENOL) 500 MG tablet Take 500 mg by mouth every 6 (six) hours as needed for mild pain or moderate pain.    [provider]  amLODipine (NORVASC) 10 MG tablet TAKE 1 TABLET BY MOUTH EVERY DAY 08/28/16   Gabriel Cirri, NP  ASPIRIN LOW DOSE 81 MG chewable tablet Chew 81 mg by mouth daily. 07/27/20   [provider]  benztropine (COGENTIN) 0.5 MG tablet Take 0.5 mg by mouth 2 (two) times daily.    [provider]  brexpiprazole (REXULTI) 2 MG TABS tablet Take 1 mg by mouth at bedtime.    [provider]  calcitRIOL (ROCALTROL) 0.25 MCG capsule Take 0.25 mcg by mouth daily.    [provider]  carbidopa-levodopa (SINEMET IR) 25-100 MG tablet Take 1 tablet by mouth 3 (three) times daily. 07/27/20   [provider]  cholecalciferol (VITAMIN D3) 25 MCG (1000 UNIT) tablet Take 400 Units by mouth daily.     [provider]  citalopram (CELEXA) 20 MG tablet Take 20 mg by mouth daily.    [provider]  DUREZOL 0.05 % EMUL Apply 1 drop to eye 2 (two) times daily. 07/28/20   [provider]  fenofibrate (TRICOR) 48 MG tablet Take 48 mg by mouth daily.    [provider]  fluticasone (FLONASE) 50 MCG/ACT nasal spray Place 2 sprays into both nostrils daily.    [provider]  fluticasone furoate-vilanterol (BREO ELLIPTA) 200-25 MCG/INH AEPB Inhale 1 puff into  the lungs daily. 08/31/20   Marrion Coy, MD  gabapentin (NEURONTIN) 100 MG capsule Take 100 mg by mouth at bedtime.    [provider]  Lactobacillus Acid-Pectin (ACIDOPHILUS/PECTIN) CAPS Take 1 capsule by mouth in the morning and at bedtime. 07/27/20   [provider]  levothyroxine (SYNTHROID) 150 MCG tablet Take 150 mcg by mouth daily. 07/27/20   [provider]  losartan (COZAAR) 50 MG tablet Take 50 mg by mouth daily.    [provider]  magnesium oxide (MAG-OX) 400 MG tablet Take 400 mg by mouth daily.    [provider]  metFORMIN (GLUCOPHAGE) 1000 MG tablet Take 0.5 tablets (500 mg total) by mouth 2 (two) times daily with a meal. 05/28/19   Dhungel, Nishant, MD  metoprolol tartrate (LOPRESSOR) 25 MG tablet Take 25 mg by mouth 2 (two) times daily.    [provider]  montelukast (SINGULAIR) 10 MG tablet Take 10 mg by mouth daily.  [provider]  nitroGLYCERIN (NITRODUR - DOSED IN MG/24 HR) 0.2 mg/hr patch Place 0.2 mg onto the skin daily. leave patch on 12-14 hours, then remove for 10-12 hours prior to applying the next patch    [provider]  pantoprazole (PROTONIX) 40 MG tablet Take 40 mg by mouth daily.    [provider]  primidone (MYSOLINE) 50 MG tablet Take 50 mg by mouth 2 (two) times daily.    [provider]  senna-docusate (SENOKOT-S) 8.6-50 MG tablet Take 1 tablet by mouth 2 (two) times daily.    [provider]  sitaGLIPtin (JANUVIA) 50 MG tablet Take 50 mg by mouth daily.    [provider]  torsemide (DEMADEX) 20 MG tablet Take 1 tablet (20 mg total) by mouth daily. 08/30/20   Marrion Coy, MD  traMADol (ULTRAM) 50 MG tablet Take 1 tablet (50 mg total) by mouth daily as needed. 10/03/20   Triplett, Rulon Eisenmenger B, FNP  traZODone (DESYREL) 50 MG tablet Take by mouth at bedtime as needed for sleep.    [provider]  vitamin B-12 (CYANOCOBALAMIN) 1000 MCG tablet Take  2,000 mcg by mouth daily.    [provider]    Allergies  Allergen Reactions   Alprazolam Swelling   Fentanyl Other (See Comments)    "burning and hot"   Meperidine Itching    Other reaction(s): Other (See Comments)   Meperidine Hcl    Ranitidine Hcl     Other reaction(s): Other (See Comments)    Family History  Problem Relation Age of Onset   Breast cancer Maternal Aunt 35   Cancer Mother        colon   Aneurysm Father     Social History Social History   Tobacco Use   Smoking status: Every Day    Packs/day: 1.00    Years: 50.00    Pack years: 50.00    Types: Cigarettes   Smokeless tobacco: Never  Vaping Use   Vaping Use: Every day  Substance Use Topics   Alcohol use: No   Drug use: No    Review of Systems Constitutional: Negative for fever. Cardiovascular: Negative for chest pain. Respiratory: Negative for shortness of breath.  Negative for cough. Gastrointestinal: Negative for abdominal pain, vomiting and diarrhea. Genitourinary: States frequent urination Musculoskeletal: States mild lower back pain but states this is chronic. Neurological: Negative for headache All other ROS negative  ____________________________________________   PHYSICAL EXAM:  VITAL SIGNS: ED Triage Vitals  Enc Vitals Group     BP 12/26/20 1408 (!) 155/68     Pulse Rate 12/26/20 1408 71     Resp 12/26/20 1408 18     Temp 12/26/20 1408 98.4 F (36.9 C)     Temp Source 12/26/20 1408 Oral     SpO2 12/26/20 1408 91 %     Weight 12/26/20 1412 143 lb (64.9 kg)     Height 12/26/20 1412 5\' 1"  (1.549 m)     Head Circumference --      Peak Flow --      Pain Score --      Pain Loc --      Pain Edu? --      Excl. in GC? --    Constitutional: Alert and oriented x3.  No acute distress. Eyes: Normal exam ENT      Head: Normocephalic and atraumatic.      Mouth/Throat: Mucous membranes are moist. Cardiovascular: Normal rate, regular rhythm.  Respiratory:  Normal  respiratory effort without tachypnea nor retractions. Breath sounds are clear Gastrointestinal: Soft and nontender. No distention.  Musculoskeletal: Nontender with normal range of motion in all extremities.  Neurologic:  Normal speech and language. No gross focal neurologic deficits  Skin:  Skin is warm, dry and intact.  Psychiatric: Mood and affect are normal.   ____________________________________________   RADIOLOGY  CT scan of the head is pending  ____________________________________________   INITIAL IMPRESSION / ASSESSMENT AND PLAN / ED COURSE  Pertinent labs & imaging results that were available during my care of the patient were reviewed by me and considered in my medical decision making (see chart for details).   Patient presents emergency department from nursing facility for increased confusion.  Per report patient went to another resident's room today and urinated on the floor which is very atypical.  Here the patient is alert she is oriented to person place and time.  Patient denies any symptoms.  States she has noted some increased urinary frequency on review of systems questioning as well as mild lower back pain which she states is chronic.  Given the report of increased confusion compared to baseline we will check labs, urinalysis, COVID swab.  There is also report that the patient may have fell recently we will obtain a CT scan of the head as a precaution.  Lab work and CT pending.  Patient care signed out to oncoming provider.  Melanie King was evaluated in Emergency Department on 12/26/2020 for the symptoms described in the history of present illness. She was evaluated in the context of the global COVID-19 pandemic, which necessitated consideration that the patient might be at risk for infection with the SARS-CoV-2 virus that causes COVID-19. Institutional protocols and algorithms that pertain to the evaluation of patients at risk for COVID-19 are in a state of rapid  change based on information released by regulatory bodies including the CDC and federal and state organizations. These policies and algorithms were followed during the patient's care in the ED.  ____________________________________________   FINAL CLINICAL IMPRESSION(S) / ED DIAGNOSES  Confusion   Minna Antis, MD 12/26/20 1454

## 2020-12-26 NOTE — Discharge Instructions (Signed)
It is unclear what caused this episode of confusion today but her work-up was reassuring with no evidence of UTI, COVID, head bleed or any other concerns.  Return to the ER for any other concerns

## 2020-12-26 NOTE — ED Notes (Signed)
Called EMS for transport back to [place] [facility]

## 2020-12-26 NOTE — ED Notes (Signed)
Miss Graves from [place] [facility] [place] states that since it's the weekend, no transportation is available and if the ambulance can't pick up the patient, then she will need to spend the night.

## 2020-12-26 NOTE — ED Triage Notes (Signed)
Per EMS report, staff at [facility] states patient is not at baseline. Patient entered another patient's room and urinated on the floor which is not the norm for this patient per report. Patient had an unwitnessed fall this morning and is now c/o a headache. Patient has a history of dementia.

## 2020-12-26 NOTE — ED Provider Notes (Addendum)
6:35 PM Assumed care for off going team.   Blood pressure (!) 160/71, pulse 77, temperature 98.4 F (36.9 C), temperature source Oral, resp. rate 18, height 5\' 1"  (1.549 m), weight 64.9 kg, SpO2 96 %.  See their HPI for full report but in brief Dementia-- went to another residents room and urinated ao x3-- increasing frequency.  CT head, labs, UA.    COVID test was negative.  Labs are reassuring.  Troponin was slightly elevated but around her baseline and will get a repeat.  On repeat it slightly elevated at 23 which I suspect is just demand and similar to her priors 23-month ago.  Went to reevaluate her and she denies any chest pain or shortness of breath.  She is alert and oriented x3 denies any other concerns.  She is neuro intact.  Urine without evidence of UTI.  CT head was negative.  Given the reassuring work-up we will send patient back to facility  I called her emergency contact that is listed as a friend to update them but this lady stated that she has no family that needs to be contacted but expressed understanding that patient was going to go back to the facility.           6-month, MD 12/26/20 1904    12/28/20, MD 12/26/20 12/28/20

## 2020-12-26 NOTE — ED Notes (Signed)
Patient is able to follow simple commands when demonstrated or when understood. Patient is not able to follow a complex conversation.

## 2020-12-28 ENCOUNTER — Emergency Department
Admission: EM | Admit: 2020-12-28 | Discharge: 2020-12-28 | Disposition: A | Discharge status: Home / Self Care | Payer: Medicare Other | Attending: Emergency Medicine | Admitting: Emergency Medicine

## 2020-12-28 ENCOUNTER — Encounter: Payer: Self-pay | Admitting: Emergency Medicine

## 2020-12-28 ENCOUNTER — Other Ambulatory Visit: Payer: Self-pay

## 2020-12-28 ENCOUNTER — Emergency Department: Payer: Medicare Other

## 2020-12-28 DIAGNOSIS — Z8616 Personal history of COVID-19: Secondary | ICD-10-CM | POA: Insufficient documentation

## 2020-12-28 DIAGNOSIS — Z7982 Long term (current) use of aspirin: Secondary | ICD-10-CM | POA: Diagnosis not present

## 2020-12-28 DIAGNOSIS — W19XXXA Unspecified fall, initial encounter: Secondary | ICD-10-CM

## 2020-12-28 DIAGNOSIS — E039 Hypothyroidism, unspecified: Secondary | ICD-10-CM | POA: Insufficient documentation

## 2020-12-28 DIAGNOSIS — M25551 Pain in right hip: Secondary | ICD-10-CM | POA: Insufficient documentation

## 2020-12-28 DIAGNOSIS — Z79899 Other long term (current) drug therapy: Secondary | ICD-10-CM | POA: Insufficient documentation

## 2020-12-28 DIAGNOSIS — Z7984 Long term (current) use of oral hypoglycemic drugs: Secondary | ICD-10-CM | POA: Insufficient documentation

## 2020-12-28 DIAGNOSIS — E1122 Type 2 diabetes mellitus with diabetic chronic kidney disease: Secondary | ICD-10-CM | POA: Diagnosis not present

## 2020-12-28 DIAGNOSIS — I2510 Atherosclerotic heart disease of native coronary artery without angina pectoris: Secondary | ICD-10-CM | POA: Diagnosis not present

## 2020-12-28 DIAGNOSIS — I130 Hypertensive heart and chronic kidney disease with heart failure and stage 1 through stage 4 chronic kidney disease, or unspecified chronic kidney disease: Secondary | ICD-10-CM | POA: Diagnosis not present

## 2020-12-28 DIAGNOSIS — R0781 Pleurodynia: Secondary | ICD-10-CM | POA: Diagnosis not present

## 2020-12-28 DIAGNOSIS — S0990XA Unspecified injury of head, initial encounter: Secondary | ICD-10-CM | POA: Diagnosis not present

## 2020-12-28 DIAGNOSIS — W010XXA Fall on same level from slipping, tripping and stumbling without subsequent striking against object, initial encounter: Secondary | ICD-10-CM | POA: Insufficient documentation

## 2020-12-28 DIAGNOSIS — N1832 Chronic kidney disease, stage 3b: Secondary | ICD-10-CM | POA: Insufficient documentation

## 2020-12-28 DIAGNOSIS — Y92009 Unspecified place in unspecified non-institutional (private) residence as the place of occurrence of the external cause: Secondary | ICD-10-CM | POA: Diagnosis not present

## 2020-12-28 DIAGNOSIS — F17210 Nicotine dependence, cigarettes, uncomplicated: Secondary | ICD-10-CM | POA: Diagnosis not present

## 2020-12-28 DIAGNOSIS — Z7952 Long term (current) use of systemic steroids: Secondary | ICD-10-CM | POA: Insufficient documentation

## 2020-12-28 DIAGNOSIS — J449 Chronic obstructive pulmonary disease, unspecified: Secondary | ICD-10-CM | POA: Diagnosis not present

## 2020-12-28 DIAGNOSIS — R531 Weakness: Secondary | ICD-10-CM | POA: Insufficient documentation

## 2020-12-28 DIAGNOSIS — I5033 Acute on chronic diastolic (congestive) heart failure: Secondary | ICD-10-CM | POA: Insufficient documentation

## 2020-12-28 DIAGNOSIS — Z950 Presence of cardiac pacemaker: Secondary | ICD-10-CM | POA: Diagnosis not present

## 2020-12-28 LAB — CBC WITH DIFFERENTIAL/PLATELET
Abs Immature Granulocytes: 0.05 10*3/uL (ref 0.00–0.07)
Basophils Absolute: 0.1 10*3/uL (ref 0.0–0.1)
Basophils Relative: 1 %
Eosinophils Absolute: 0 10*3/uL (ref 0.0–0.5)
Eosinophils Relative: 0 %
HCT: 36.8 % (ref 36.0–46.0)
Hemoglobin: 12.4 g/dL (ref 12.0–15.0)
Immature Granulocytes: 1 %
Lymphocytes Relative: 9 %
Lymphs Abs: 1 10*3/uL (ref 0.7–4.0)
MCH: 32 pg (ref 26.0–34.0)
MCHC: 33.7 g/dL (ref 30.0–36.0)
MCV: 95.1 fL (ref 80.0–100.0)
Monocytes Absolute: 0.6 10*3/uL (ref 0.1–1.0)
Monocytes Relative: 6 %
Neutro Abs: 8.7 10*3/uL — ABNORMAL HIGH (ref 1.7–7.7)
Neutrophils Relative %: 83 %
Platelets: 331 10*3/uL (ref 150–400)
RBC: 3.87 MIL/uL (ref 3.87–5.11)
RDW: 15 % (ref 11.5–15.5)
WBC: 10.4 10*3/uL (ref 4.0–10.5)
nRBC: 0 % (ref 0.0–0.2)

## 2020-12-28 LAB — COMPREHENSIVE METABOLIC PANEL
ALT: 5 U/L (ref 0–44)
AST: 17 U/L (ref 15–41)
Albumin: 3.8 g/dL (ref 3.5–5.0)
Alkaline Phosphatase: 64 U/L (ref 38–126)
Anion gap: 11 (ref 5–15)
BUN: 34 mg/dL — ABNORMAL HIGH (ref 8–23)
CO2: 23 mmol/L (ref 22–32)
Calcium: 9.6 mg/dL (ref 8.9–10.3)
Chloride: 99 mmol/L (ref 98–111)
Creatinine, Ser: 1.93 mg/dL — ABNORMAL HIGH (ref 0.44–1.00)
GFR, Estimated: 27 mL/min — ABNORMAL LOW (ref >60)
Glucose, Bld: 263 mg/dL — ABNORMAL HIGH (ref 70–99)
Potassium: 4.3 mmol/L (ref 3.5–5.1)
Sodium: 133 mmol/L — ABNORMAL LOW (ref 135–145)
Total Bilirubin: 0.6 mg/dL (ref 0.3–1.2)
Total Protein: 8 g/dL (ref 6.5–8.1)

## 2020-12-28 LAB — URINALYSIS, ROUTINE W REFLEX MICROSCOPIC
Bacteria, UA: NONE SEEN
Bilirubin Urine: NEGATIVE
Glucose, UA: NEGATIVE mg/dL
Hgb urine dipstick: NEGATIVE
Ketones, ur: NEGATIVE mg/dL
Leukocytes,Ua: NEGATIVE
Nitrite: NEGATIVE
Protein, ur: 100 mg/dL — AB
Specific Gravity, Urine: 1.009 (ref 1.005–1.030)
Squamous Epithelial / HPF: NONE SEEN (ref 0–5)
WBC, UA: NONE SEEN WBC/hpf (ref 0–5)
pH: 5 (ref 5.0–8.0)

## 2020-12-28 MED ORDER — SODIUM CHLORIDE 0.9 % IV BOLUS
500.0000 mL | Freq: Once | INTRAVENOUS | Status: AC
  Administered 2020-12-28: 500 mL via INTRAVENOUS

## 2020-12-28 MED ORDER — CARBIDOPA-LEVODOPA ER 25-100 MG PO TBCR
1.0000 | EXTENDED_RELEASE_TABLET | Freq: Once | ORAL | Status: AC
  Administered 2020-12-28: 1 via ORAL
  Filled 2020-12-28: qty 1

## 2020-12-28 MED ORDER — ACETAMINOPHEN 500 MG PO TABS
1000.0000 mg | ORAL_TABLET | Freq: Once | ORAL | Status: AC
  Administered 2020-12-28: 1000 mg via ORAL
  Filled 2020-12-28: qty 2

## 2020-12-28 NOTE — ED Notes (Signed)
Patient bed wet. Pt cleaned with new brief/ bed liens applied. Purewick reapplied. No needs expressed.

## 2020-12-28 NOTE — ED Triage Notes (Signed)
Patient to ED via [facility] from [facility] [place] after a mechanical fall outside. Patient complaining of left backside of head, right shoulder, and right mid back pain. No LOC, patient does take aspirin.

## 2020-12-28 NOTE — ED Notes (Signed)
[facility]  CALLED  FOR  TRANSPORT  [facility]  [place] 

## 2020-12-28 NOTE — ED Notes (Signed)
Patient placed on purewick at this time. 

## 2020-12-28 NOTE — Discharge Instructions (Signed)
You can take Tylenol, 500 mg - 1000 mg (1-2 tablets) every 6 hours for pain  Drink plenty of water at home  Take your medications as prescribed

## 2020-12-28 NOTE — ED Notes (Signed)
Patient at xray

## 2020-12-28 NOTE — ED Provider Notes (Signed)
[facility] Emergency Department Provider Note  ____________________________________________   Event Date/Time   First MD Initiated Contact with Patient 12/28/20 [phone]     (approximate)  I have reviewed the triage vital signs and the nursing notes.   HISTORY  Chief Complaint Fall    HPI Melanie King is a 75 y.o. female with past medical history as below here with fall.  The patient states that she tripped at her home today.  She has a history of recurrent falls due to generalized weakness.  She states that she was well prior to the fall and that she had just lost her footing.  She has been seen recently for this.  Patient has a fairly extensive history of degenerative disc and neck disease, which she states limits her mobility.  Denies any recent fevers or chills.  No cough.  No shortness of breath.  No recent medication changes.  She states she has some mild neck pain but this is fairly chronic, as well as primarily right chest and right hip pain after the fall.  She had no pain prior to the fall.  No other complaints.  No urinary symptoms.    Past Medical History:  Diagnosis Date   Arthritis    CHF (congestive heart failure) (HCC)    COPD (chronic obstructive pulmonary disease) (HCC)    Coronary arteriosclerosis    DDD (degenerative disc disease), cervical    DDD (degenerative disc disease), lumbar    Depression    Diabetes mellitus without complication (HCC)    GERD (gastroesophageal reflux disease)    Hyperlipidemia    Hypertension    Osteoarthritis    Pacemaker    Tremor     Patient Active Problem List   Diagnosis Date Noted   Malnutrition of moderate degree 08/28/2020   Elevated troponin 08/26/2020   CHF (congestive heart failure) (HCC) 08/26/2020   Carotid stenosis 04/12/2020   Dysphagia 07/19/2019   Acute respiratory failure due to COVID-19 (HCC) 07/16/2019   Respiratory distress    Acute on chronic congestive heart failure (HCC)     Acute respiratory failure with hypoxia (HCC) 07/15/2019   Frequent falls 05/26/2019   Generalized weakness 05/26/2019   Acute kidney injury superimposed on CKD lllb (HCC) 03/21/2019   Subdural hematoma 03/20/2019   Cardiac pacemaker 03/17/2019   Hypothyroidism 03/10/2019   Depression    Acute on chronic diastolic CHF (congestive heart failure) (HCC)    Acute metabolic encephalopathy    Fall    Tobacco abuse    C6 cervical fracture (HCC)    Left hand pain 04/01/2018   Numbness and tingling in left hand 04/01/2018   Left hand weakness 03/05/2018   Confusion 05/12/2017   Seizure (HCC) 05/10/2017   Elevated sedimentation rate 04/04/2017   Elevated C-reactive protein (CRP) 04/04/2017   Chest pain, unspecified 04/02/2017   Bilateral lower extremity pain (Secondary Area of Pain) (R>L) 04/02/2017   Chronic hip pain, bilateral  (Secondary Area of Pain) (R>L) 04/02/2017   Chronic bilateral low back pain with bilateral sciatica [facility] Area of Pain) (R>L) 04/02/2017   Chronic pain syndrome 04/02/2017   Disorder of bone, unspecified 04/02/2017   Other long term (current) drug therapy 04/02/2017   Other specified health status 04/02/2017   Long term current use of opiate analgesic 04/02/2017   Hip pain, bilateral 12/06/2016   Tremor 05/27/2016   Trigger finger of left hand 02/16/2016   Osteoarthritis of both hands 02/16/2016   Angina pectoris (  HCC) 01/17/2016   COPD with acute exacerbation (HCC) 01/17/2016   CAD (coronary artery disease) 01/17/2016   HLD (hyperlipidemia) 01/17/2016   Hypertension 01/17/2016   Sick sinus syndrome (HCC) 01/17/2016   Chronic pain 01/17/2016   Personal history of tobacco use, presenting hazards to health 11/16/2015   Type 2 diabetes mellitus without complication, with long-term current use of insulin (HCC) 05/12/2013    Past Surgical History:  Procedure Laterality Date   ABDOMINAL HYSTERECTOMY     BREAST BIOPSY Right 1994   neg cyst removed    CATARACT EXTRACTION W/PHACO Right 08/17/2020   Procedure: CATARACT EXTRACTION PHACO AND INTRAOCULAR LENS PLACEMENT (IOC) RIGHT DIABETIC 8.90 00:52.9;  Surgeon: Galen Manila, MD;  Location: [facility];  Service: Ophthalmology;  Laterality: Right;  Diabetic   CHOLECYSTECTOMY     EYE SURGERY  1964, 1966, and 1967   bilateral   FOOT SURGERY Right    cellulitis   PACEMAKER INSERTION     PPM GENERATOR CHANGEOUT N/A 02/24/2020   Procedure: PPM GENERATOR CHANGEOUT;  Surgeon: Marcina Millard, MD;  Location: [facility] INVASIVE CV LAB;  Service: Cardiovascular;  Laterality: N/A;   SPINE SURGERY     cyst removed; [facility]    Prior to Admission medications   Medication Sig Start Date End Date Taking? Authorizing Provider  acetaminophen (TYLENOL) 500 MG tablet Take 500 mg by mouth every 6 (six) hours as needed for mild pain or moderate pain.   Yes [provider]  amLODipine (NORVASC) 5 MG tablet Take 5 mg by mouth daily.   Yes [provider]  ASPIRIN LOW DOSE 81 MG chewable tablet Chew 81 mg by mouth daily. 07/27/20  Yes [provider]  benztropine (COGENTIN) 0.5 MG tablet Take 0.5 mg by mouth 2 (two) times daily.   Yes [provider]  brexpiprazole (REXULTI) 2 MG TABS tablet Take 1 mg by mouth at bedtime.   Yes [provider]  calcitRIOL (ROCALTROL) 0.25 MCG capsule Take 0.25 mcg by mouth daily.   Yes [provider]  carbidopa-levodopa (SINEMET IR) 25-100 MG tablet Take 1 tablet by mouth 3 (three) times daily. 07/27/20  Yes [provider]  cholecalciferol (VITAMIN D3) 25 MCG (1000 UNIT) tablet Take 400 Units by mouth daily.    Yes [provider]  citalopram (CELEXA) 20 MG tablet Take 20 mg by mouth daily.   Yes [provider]  fenofibrate (TRICOR) 48 MG tablet Take 48 mg by mouth daily.   Yes [provider]  fluticasone (FLONASE) 50 MCG/ACT nasal spray Place 2 sprays into both nostrils  daily.   Yes [provider]  fluticasone furoate-vilanterol (BREO ELLIPTA) 200-25 MCG/INH AEPB Inhale 1 puff into the lungs daily. 08/31/20  Yes Marrion Coy, MD  gabapentin (NEURONTIN) 100 MG capsule Take 100 mg by mouth at bedtime.   Yes [provider]  hydroxypropyl methylcellulose / hypromellose (ISOPTO TEARS / GONIOVISC) 2.5 % ophthalmic solution Place 1 drop into the right eye 4 (four) times daily.   Yes [provider]  ILEVRO 0.3 % ophthalmic suspension Place 1 drop into the right eye daily. 12/04/20  Yes [provider]  Lactobacillus Acid-Pectin (ACIDOPHILUS/PECTIN) CAPS Take 1 capsule by mouth in the morning and at bedtime. 07/27/20  Yes [provider]  levothyroxine (SYNTHROID) 150 MCG tablet Take 150 mcg by mouth daily. 07/27/20  Yes [provider]  losartan (COZAAR) 50 MG tablet Take 50 mg by mouth daily.   Yes [provider]  magnesium oxide (MAG-OX) 400 MG tablet Take 400 mg by mouth daily.   Yes [provider]  metFORMIN (GLUCOPHAGE) 1000 MG tablet Take 0.5 tablets (500 mg total) by mouth 2 (two) times daily with a meal. 05/28/19  Yes Dhungel, Nishant, MD  metoprolol tartrate (LOPRESSOR) 25 MG tablet Take 25 mg by mouth 2 (two) times daily.   Yes [provider]  montelukast (SINGULAIR) 10 MG tablet Take 10 mg by mouth daily.   Yes [provider]  pantoprazole (PROTONIX) 40 MG tablet Take 40 mg by mouth daily.   Yes [provider]  primidone (MYSOLINE) 50 MG tablet Take 50 mg by mouth 2 (two) times daily.   Yes [provider]  senna-docusate (SENOKOT-S) 8.6-50 MG tablet Take 2 tablets by mouth 2 (two) times daily.   Yes [provider]  sitaGLIPtin (JANUVIA) 50 MG tablet Take 50 mg by mouth daily.   Yes [provider]  torsemide (DEMADEX) 20 MG tablet Take 1 tablet (20 mg total) by mouth daily. 08/30/20  Yes Marrion Coy, MD  traMADol (ULTRAM) 50 MG  tablet Take 50 mg by mouth 2 (two) times daily.   Yes [provider]  vitamin B-12 (CYANOCOBALAMIN) 1000 MCG tablet Take 2,000 mcg by mouth daily.   Yes [provider]  amLODipine (NORVASC) 10 MG tablet TAKE 1 TABLET BY MOUTH EVERY DAY Patient not taking: Reported on 12/28/2020 08/28/16   Gabriel Cirri, NP  DUREZOL 0.05 % EMUL Apply 1 drop to eye 2 (two) times daily. Patient not taking: Reported on 12/26/2020 07/28/20   [provider]  guaiFENesin (ROBITUSSIN) 100 MG/5ML liquid Take 5 mLs by mouth every 6 (six) hours as needed for cough or to loosen phlegm. Patient not taking: Reported on 12/28/2020    [provider]  loperamide (IMODIUM A-D) 2 MG tablet Take 2 mg by mouth 4 (four) times daily as needed for diarrhea or loose stools.    [provider]  nitroGLYCERIN (NITRODUR - DOSED IN MG/24 HR) 0.2 mg/hr patch Place 0.2 mg onto the skin daily. leave patch on 12-14 hours, then remove for 10-12 hours prior to applying the next patch    [provider]  primidone (MYSOLINE) 50 MG tablet Take by mouth 4 (four) times daily. Patient not taking: Reported on 12/28/2020    [provider]  traZODone (DESYREL) 50 MG tablet Take by mouth at bedtime as needed for sleep.    [provider]    Allergies Alprazolam, Fentanyl, Meperidine, Meperidine hcl, and Ranitidine hcl  Family History  Problem Relation Age of Onset   Breast cancer Maternal Aunt 64   Cancer Mother        colon   Aneurysm Father     Social History Social History   Tobacco Use   Smoking status: Every Day    Packs/day: 1.00    Years: 50.00    Pack years: 50.00    Types: Cigarettes   Smokeless tobacco: Never  Vaping Use   Vaping Use: Every day  Substance Use Topics   Alcohol use: No   Drug use: No    Review of Systems  Review of Systems  Constitutional:  Negative for chills and fever.  HENT:  Negative for sore throat.   Respiratory:  Negative  for shortness of breath.   Cardiovascular:  Negative for chest pain.  Gastrointestinal:  Negative for abdominal pain.  Genitourinary:  Negative for flank pain.  Musculoskeletal:  Positive  for arthralgias and myalgias. Negative for neck pain.  Skin:  Negative for rash and wound.  Allergic/Immunologic: Negative for immunocompromised state.  Neurological:  Negative for weakness and numbness.  Hematological:  Does not bruise/bleed easily.    ____________________________________________  PHYSICAL EXAM:      VITAL SIGNS: ED Triage Vitals  Enc Vitals Group     BP 12/28/20 0918 (!) 172/64     Pulse Rate 12/28/20 0918 73     Resp 12/28/20 0918 18     Temp 12/28/20 0918 98.4 F (36.9 C)     Temp Source 12/28/20 0918 Oral     SpO2 12/28/20 0914 95 %     Weight 12/28/20 0922 162 lb 4.1 oz (73.6 kg)     Height 12/28/20 0922 5\' 1"  (1.549 m)     Head Circumference --      Peak Flow --      Pain Score 12/28/20 0921 7     Pain Loc --      Pain Edu? --      Excl. in GC? --      Physical Exam Vitals and nursing note reviewed.  Constitutional:      General: She is not in acute distress.    Appearance: She is well-developed.  HENT:     Head: Normocephalic and atraumatic.  Eyes:     Conjunctiva/sclera: Conjunctivae normal.  Cardiovascular:     Rate and Rhythm: Normal rate and regular rhythm.     Heart sounds: Normal heart sounds.  Pulmonary:     Effort: Pulmonary effort is normal. No respiratory distress.     Breath sounds: No wheezing.  Abdominal:     General: There is no distension.  Musculoskeletal:     Cervical back: Neck supple.     Comments: Moderate tenderness to palpation over the bilateral paraspinal muscles of the cervical spine, no midline tenderness or step-offs.  Mild tenderness to the right lower rib cage as well as right lateral hip.  Mild pain with logroll of the hip.  There is no obvious shortening or external/internal rotation.  Strength 5 and 5 bilateral upper and  lower extremities.  Normal sensation to light touch bilateral upper and lower extremities.  Skin:    General: Skin is warm.     Capillary Refill: Capillary refill takes less than 2 seconds.     Findings: No rash.  Neurological:     Mental Status: She is alert and oriented to person, place, and time.     Motor: No abnormal muscle tone.      ____________________________________________   LABS (all labs ordered are listed, but only abnormal results are displayed)  Labs Reviewed  URINALYSIS, ROUTINE W REFLEX MICROSCOPIC - Abnormal; Notable for the following components:      Result Value   Color, Urine YELLOW (*)    APPearance CLEAR (*)    Protein, ur 100 (*)    All other components within normal limits  CBC WITH DIFFERENTIAL/PLATELET - Abnormal; Notable for the following components:   Neutro Abs 8.7 (*)    All other components within normal limits  COMPREHENSIVE METABOLIC PANEL - Abnormal; Notable for the following components:   Sodium 133 (*)    Glucose, Bld 263 (*)    BUN 34 (*)    Creatinine, Ser 1.93 (*)    GFR, Estimated 27 (*)    All other components within normal limits    ____________________________________________  EKG: Normal sinus rhythm, ventricular rate 79.  PR  192, QRS 137, QTc 493.  No acute ST elevations or depressions.  No acute evidence of acute ischemia or infarct. ________________________________________  RADIOLOGY All imaging, including plain films, CT scans, and ultrasounds, independently reviewed by me, and interpretations confirmed via formal radiology reads.  ED MD interpretation:   CT head/C-spine: Diffuse spondylolysis which is a chronic issue, no acute fracture or malalignment, no acute intracranial abnormality X-ray hip right: Negative DG hips right: Negative  Official radiology report(s): DG Ribs Unilateral W/Chest Right  Result Date: 12/28/2020 CLINICAL DATA:  Fall, right rib pain EXAM: RIGHT RIBS AND CHEST - 3+ VIEW COMPARISON:  Chest  radiograph 08/30/2020 FINDINGS: A left chest wall cardiac device and associated leads are stable. The heart is enlarged, grossly unchanged. The mediastinal contours are within normal limits. Lung volumes are low. Increased interstitial markings throughout both lungs likely reflecting vascular congestion without overt pulmonary interstitial edema. There is no focal consolidation. There is no pleural effusion or pneumothorax. No rib fracture is identified. IMPRESSION: 1. No rib fracture identified. 2. Cardiomegaly with vascular congestion but no overt pulmonary edema. Electronically Signed   By: Lesia Hausen M.D.   On: 12/28/2020 11:04   CT HEAD WO CONTRAST ( )  Result Date: 12/28/2020 CLINICAL DATA:  Head trauma, minor. Neck trauma. Additional history provided: Mechanical fall, patient reports head pain, right shoulder pain, mid back pain. EXAM: CT HEAD WITHOUT CONTRAST CT CERVICAL SPINE WITHOUT CONTRAST TECHNIQUE: Multidetector CT imaging of the head and cervical spine was performed following the standard protocol without intravenous contrast. Multiplanar CT image reconstructions of the cervical spine were also generated. COMPARISON:  Prior head CT examinations 12/26/2020 and earlier. CT of the cervical spine 10/12/2020. FINDINGS: CT HEAD FINDINGS Brain: Generalized cerebral atrophy. Redemonstrated small hypodense foci within the basal ganglia bilaterally, likely reflecting a combination of chronic lacunar infarcts and prominent perivascular spaces. Background mild patchy and ill-defined hypoattenuation within the cerebral white matter, nonspecific but compatible chronic small vessel ischemic disease. There is no acute intracranial hemorrhage. No demarcated cortical infarct. No extra-axial fluid collection. No evidence of an intracranial mass. No midline shift. Vascular: No hyperdense vessel.  Atherosclerotic calcifications. Skull: Normal. Negative for fracture or focal lesion. Sinuses/Orbits: Visualized  orbits show no acute finding. Trace mucosal thickening within the left maxillary sinus at the imaged levels. Minimal scattered mucosal thickening also present within the bilateral ethmoid sinuses. CT CERVICAL SPINE FINDINGS Alignment: Reversal of the expected cervical lordosis. Mild cervical dextrocurvature. No significant spondylolisthesis. Skull base and vertebrae: The basion-dental and atlanto-dental intervals are maintained.No evidence of acute fracture to the cervical spine. Osseous fusion across the C4-C5 disc space. Also at C4-C5, there is facet joint ankylosis on the left, and possibly also on the right. Congenital nonunion of the posterior arch of C1. Soft tissues and spinal canal: No prevertebral soft tissue swelling or visible canal hematoma. Disc levels: Cervical spondylosis with multilevel disc space narrowing, disc bulges/central disc protrusions, endplate spurring, uncovertebral hypertrophy and facet arthrosis. Multilevel spinal canal stenosis. Most notably, there is suspected moderate or severe spinal canal stenosis at C3-C4. Bony neural foraminal narrowing on the right at C3-C4. Upper chest: No consolidation within the imaged lung apices. No visible pneumothorax. IMPRESSION: CT head: 1. No evidence of acute intracranial abnormality. 2. Cerebral atrophy and chronic small vessel ischemic disease, stable from the prior examination of 12/26/2020. 3. Mild paranasal sinus mucosal thickening at the imaged levels, as described. CT cervical spine: 1. No evidence of acute fracture to the cervical spine. 2. Reversal  of the expected cervical lordosis. 3. Cervical dextrocurvature. 4. C4-C5 ankylosis. 5. Cervical spondylosis, as described. Notably, there is suspected moderate or severe spinal canal stenosis at C3-C4. Bony neural foraminal narrowing on the right at C3-C4. Electronically Signed   By: Jackey Loge D.O.   On: 12/28/2020 11:20   CT Cervical Spine Wo Contrast  Result Date: 12/28/2020 CLINICAL  DATA:  Head trauma, minor. Neck trauma. Additional history provided: Mechanical fall, patient reports head pain, right shoulder pain, mid back pain. EXAM: CT HEAD WITHOUT CONTRAST CT CERVICAL SPINE WITHOUT CONTRAST TECHNIQUE: Multidetector CT imaging of the head and cervical spine was performed following the standard protocol without intravenous contrast. Multiplanar CT image reconstructions of the cervical spine were also generated. COMPARISON:  Prior head CT examinations 12/26/2020 and earlier. CT of the cervical spine 10/12/2020. FINDINGS: CT HEAD FINDINGS Brain: Generalized cerebral atrophy. Redemonstrated small hypodense foci within the basal ganglia bilaterally, likely reflecting a combination of chronic lacunar infarcts and prominent perivascular spaces. Background mild patchy and ill-defined hypoattenuation within the cerebral white matter, nonspecific but compatible chronic small vessel ischemic disease. There is no acute intracranial hemorrhage. No demarcated cortical infarct. No extra-axial fluid collection. No evidence of an intracranial mass. No midline shift. Vascular: No hyperdense vessel.  Atherosclerotic calcifications. Skull: Normal. Negative for fracture or focal lesion. Sinuses/Orbits: Visualized orbits show no acute finding. Trace mucosal thickening within the left maxillary sinus at the imaged levels. Minimal scattered mucosal thickening also present within the bilateral ethmoid sinuses. CT CERVICAL SPINE FINDINGS Alignment: Reversal of the expected cervical lordosis. Mild cervical dextrocurvature. No significant spondylolisthesis. Skull base and vertebrae: The basion-dental and atlanto-dental intervals are maintained.No evidence of acute fracture to the cervical spine. Osseous fusion across the C4-C5 disc space. Also at C4-C5, there is facet joint ankylosis on the left, and possibly also on the right. Congenital nonunion of the posterior arch of C1. Soft tissues and spinal canal: No  prevertebral soft tissue swelling or visible canal hematoma. Disc levels: Cervical spondylosis with multilevel disc space narrowing, disc bulges/central disc protrusions, endplate spurring, uncovertebral hypertrophy and facet arthrosis. Multilevel spinal canal stenosis. Most notably, there is suspected moderate or severe spinal canal stenosis at C3-C4. Bony neural foraminal narrowing on the right at C3-C4. Upper chest: No consolidation within the imaged lung apices. No visible pneumothorax. IMPRESSION: CT head: 1. No evidence of acute intracranial abnormality. 2. Cerebral atrophy and chronic small vessel ischemic disease, stable from the prior examination of 12/26/2020. 3. Mild paranasal sinus mucosal thickening at the imaged levels, as described. CT cervical spine: 1. No evidence of acute fracture to the cervical spine. 2. Reversal of the expected cervical lordosis. 3. Cervical dextrocurvature. 4. C4-C5 ankylosis. 5. Cervical spondylosis, as described. Notably, there is suspected moderate or severe spinal canal stenosis at C3-C4. Bony neural foraminal narrowing on the right at C3-C4. Electronically Signed   By: Jackey Loge D.O.   On: 12/28/2020 11:20   DG Hip Unilat W or Wo Pelvis 2-3 Views Right  Result Date: 12/28/2020 CLINICAL DATA:  Fall.  Right hip pain. EXAM: DG HIP (WITH OR WITHOUT PELVIS) 2-3V RIGHT COMPARISON:  Radiographs of the left hip 05/31/2019. Radiographs of the right hip 03/20/2019. FINDINGS: There is normal bony alignment. No evidence of acute osseous or articular abnormality. Mild right femoroacetabular joint degenerative arthrosis. Mild degenerative changes are also present at the pubic symphysis. IMPRESSION: No evidence of acute osseous or articular abnormality. Electronically Signed   By: Jackey Loge D.O.   On:  12/28/2020 11:02    ____________________________________________  PROCEDURES   Procedure(s) performed (including Critical Care):  .1-3 Lead EKG Interpretation Performed  by: Shaune Pollack, MD Authorized by: Shaune Pollack, MD     Interpretation: normal     ECG rate:  70-90   ECG rate assessment: normal     Rhythm: sinus rhythm     Ectopy: none     Conduction: normal   Comments:     Indication: Fall  ____________________________________________  INITIAL IMPRESSION / MDM / ASSESSMENT AND PLAN / ED COURSE  As part of my medical decision making, I reviewed the following data within the electronic MEDICAL RECORD NUMBER Nursing notes reviewed and incorporated, Old chart reviewed, Notes from prior ED visits, and [place] Controlled Substance Database       *Melanie King was evaluated in Emergency Department on 12/28/2020 for the symptoms described in the history of present illness. She was evaluated in the context of the global COVID-19 pandemic, which necessitated consideration that the patient might be at risk for infection with the SARS-CoV-2 virus that causes COVID-19. Institutional protocols and algorithms that pertain to the evaluation of patients at risk for COVID-19 are in a state of rapid change based on information released by regulatory bodies including the CDC and federal and state organizations. These policies and algorithms were followed during the patient's care in the ED.  Some ED evaluations and interventions may be delayed as a result of limited staffing during the pandemic.*     Medical Decision Making: 75 year old female here with multiple areas of pain after fall.  Patient has a history of recurrent mechanical falls, and I suspect this is due to general deconditioning.  Imaging was obtained of any areas of tenderness.  CT head and C-spine reviewed and showed no acute abnormality.  She has chronic degenerative changes and ankylosis, but no midline tenderness or signs of radiculopathy or other acute injury.  Plain films of the chest are clear.  Plain films of the hip are unremarkable.  Patient does appear mildly dehydrated with elevated BUN and  creatinine compared to baseline.  She was given cautious fluids in the setting of her CHF.  Urinalysis negative for UTI.  No leukocytosis.  Patient feels better after Tylenol and a small amount of fluid.  She is tolerating p.o.  Will discharge home with supportive care and good return precautions.  ____________________________________________  FINAL CLINICAL IMPRESSION(S) / ED DIAGNOSES  Final diagnoses:  Fall, initial encounter     MEDICATIONS GIVEN DURING THIS VISIT:  Medications  sodium chloride 0.9 % bolus 500 mL (0 mLs Intravenous Stopped 12/28/20 1307)  acetaminophen (TYLENOL) tablet 1,000 mg (1,000 mg Oral Given 12/28/20 1149)  Carbidopa-Levodopa ER (SINEMET CR) 25-100 MG tablet controlled release 1 tablet (1 tablet Oral Given 12/28/20 1330)     ED Discharge Orders     None        Note:  This document was prepared using Dragon voice recognition software and may include unintentional dictation errors.   Shaune Pollack, MD 12/28/20 [phone]

## 2021-01-05 ENCOUNTER — Emergency Department: Payer: Medicare Other

## 2021-01-05 ENCOUNTER — Other Ambulatory Visit: Payer: Self-pay

## 2021-01-05 ENCOUNTER — Emergency Department
Admission: EM | Admit: 2021-01-05 | Discharge: 2021-01-05 | Disposition: A | Discharge status: Home / Self Care | Payer: Medicare Other | Attending: Emergency Medicine | Admitting: Emergency Medicine

## 2021-01-05 DIAGNOSIS — R1010 Upper abdominal pain, unspecified: Secondary | ICD-10-CM | POA: Diagnosis not present

## 2021-01-05 DIAGNOSIS — Z7951 Long term (current) use of inhaled steroids: Secondary | ICD-10-CM | POA: Insufficient documentation

## 2021-01-05 DIAGNOSIS — M542 Cervicalgia: Secondary | ICD-10-CM | POA: Diagnosis not present

## 2021-01-05 DIAGNOSIS — N1832 Chronic kidney disease, stage 3b: Secondary | ICD-10-CM | POA: Diagnosis not present

## 2021-01-05 DIAGNOSIS — E039 Hypothyroidism, unspecified: Secondary | ICD-10-CM | POA: Diagnosis not present

## 2021-01-05 DIAGNOSIS — J449 Chronic obstructive pulmonary disease, unspecified: Secondary | ICD-10-CM | POA: Diagnosis not present

## 2021-01-05 DIAGNOSIS — Y92129 Unspecified place in nursing home as the place of occurrence of the external cause: Secondary | ICD-10-CM | POA: Insufficient documentation

## 2021-01-05 DIAGNOSIS — W19XXXA Unspecified fall, initial encounter: Secondary | ICD-10-CM | POA: Insufficient documentation

## 2021-01-05 DIAGNOSIS — Z950 Presence of cardiac pacemaker: Secondary | ICD-10-CM | POA: Insufficient documentation

## 2021-01-05 DIAGNOSIS — F17210 Nicotine dependence, cigarettes, uncomplicated: Secondary | ICD-10-CM | POA: Diagnosis not present

## 2021-01-05 DIAGNOSIS — M25511 Pain in right shoulder: Secondary | ICD-10-CM | POA: Insufficient documentation

## 2021-01-05 DIAGNOSIS — M25552 Pain in left hip: Secondary | ICD-10-CM | POA: Insufficient documentation

## 2021-01-05 DIAGNOSIS — I2510 Atherosclerotic heart disease of native coronary artery without angina pectoris: Secondary | ICD-10-CM | POA: Diagnosis not present

## 2021-01-05 DIAGNOSIS — S0990XA Unspecified injury of head, initial encounter: Secondary | ICD-10-CM | POA: Diagnosis not present

## 2021-01-05 DIAGNOSIS — Z7982 Long term (current) use of aspirin: Secondary | ICD-10-CM | POA: Insufficient documentation

## 2021-01-05 DIAGNOSIS — I130 Hypertensive heart and chronic kidney disease with heart failure and stage 1 through stage 4 chronic kidney disease, or unspecified chronic kidney disease: Secondary | ICD-10-CM | POA: Diagnosis not present

## 2021-01-05 DIAGNOSIS — Z79899 Other long term (current) drug therapy: Secondary | ICD-10-CM | POA: Insufficient documentation

## 2021-01-05 DIAGNOSIS — Z7984 Long term (current) use of oral hypoglycemic drugs: Secondary | ICD-10-CM | POA: Insufficient documentation

## 2021-01-05 DIAGNOSIS — I5033 Acute on chronic diastolic (congestive) heart failure: Secondary | ICD-10-CM | POA: Diagnosis not present

## 2021-01-05 DIAGNOSIS — E1122 Type 2 diabetes mellitus with diabetic chronic kidney disease: Secondary | ICD-10-CM | POA: Diagnosis not present

## 2021-01-05 DIAGNOSIS — Z8616 Personal history of COVID-19: Secondary | ICD-10-CM | POA: Diagnosis not present

## 2021-01-05 DIAGNOSIS — M25551 Pain in right hip: Secondary | ICD-10-CM | POA: Diagnosis not present

## 2021-01-05 DIAGNOSIS — R102 Pelvic and perineal pain: Secondary | ICD-10-CM | POA: Diagnosis not present

## 2021-01-05 DIAGNOSIS — M25512 Pain in left shoulder: Secondary | ICD-10-CM | POA: Insufficient documentation

## 2021-01-05 DIAGNOSIS — R0781 Pleurodynia: Secondary | ICD-10-CM | POA: Diagnosis not present

## 2021-01-05 LAB — CBG MONITORING, ED: Glucose-Capillary: 143 mg/dL — ABNORMAL HIGH (ref 70–99)

## 2021-01-05 NOTE — ED Notes (Signed)
Patient cleaned and new brief changed. Blue scrub pants placed on patient. Soda given to patient

## 2021-01-05 NOTE — ED Provider Notes (Signed)
[facility] Emergency Department Provider Note  ____________________________________________   Event Date/Time   First MD Initiated Contact with Patient 01/05/21 1237     (approximate)  I have reviewed the triage vital signs and the nursing notes.   HISTORY  Chief Complaint Fall  HPI Melanie King is a 75 y.o. female who reportedly had a witnessed fall at her care facility.  She comes in complaining of pain in the shoulders and neck and ribs and upper abdomen and pelvis and hips.  Palpation of all these areas results in her complaining of pain. Patient otherwise reports she feels well.      Past Medical History:  Diagnosis Date   Arthritis    CHF (congestive heart failure) (HCC)    COPD (chronic obstructive pulmonary disease) (HCC)    Coronary arteriosclerosis    DDD (degenerative disc disease), cervical    DDD (degenerative disc disease), lumbar    Depression    Diabetes mellitus without complication (HCC)    GERD (gastroesophageal reflux disease)    Hyperlipidemia    Hypertension    Osteoarthritis    Pacemaker    Tremor     Patient Active Problem List   Diagnosis Date Noted   Malnutrition of moderate degree 08/28/2020   Elevated troponin 08/26/2020   CHF (congestive heart failure) ([place]) 08/26/2020   Carotid stenosis 04/12/2020   Dysphagia 07/19/2019   Acute respiratory failure due to COVID-19 ([place]) 07/16/2019   Respiratory distress    Acute on chronic congestive heart failure ([place])    Acute respiratory failure with hypoxia ([place]) 07/15/2019   Frequent falls 05/26/2019   Generalized weakness 05/26/2019   Acute kidney injury superimposed on CKD lllb ([place]) 03/21/2019   Subdural hematoma 03/20/2019   Cardiac pacemaker 03/17/2019   Hypothyroidism 03/10/2019   Depression    Acute on chronic diastolic CHF (congestive heart failure) ([place])    Acute metabolic encephalopathy    Fall    Tobacco abuse    C6 cervical fracture ([place])     Left hand pain 04/01/2018   Numbness and tingling in left hand 04/01/2018   Left hand weakness 03/05/2018   Confusion 05/12/2017   Seizure ([place]) 05/10/2017   Elevated sedimentation rate 04/04/2017   Elevated C-reactive protein (CRP) 04/04/2017   Chest pain, unspecified 04/02/2017   Bilateral lower extremity pain (Secondary Area of Pain) (R>L) 04/02/2017   Chronic hip pain, bilateral  (Secondary Area of Pain) (R>L) 04/02/2017   Chronic bilateral low back pain with bilateral sciatica [facility] Area of Pain) (R>L) 04/02/2017   Chronic pain syndrome 04/02/2017   Disorder of bone, unspecified 04/02/2017   Other long term (current) drug therapy 04/02/2017   Other specified health status 04/02/2017   Long term current use of opiate analgesic 04/02/2017   Hip pain, bilateral 12/06/2016   Tremor 05/27/2016   Trigger finger of left hand 02/16/2016   Osteoarthritis of both hands 02/16/2016   Angina pectoris ([place]) 01/17/2016   COPD with acute exacerbation ([place]) 01/17/2016   CAD (coronary artery disease) 01/17/2016   HLD (hyperlipidemia) 01/17/2016   Hypertension 01/17/2016   Sick sinus syndrome ([place]) 01/17/2016   Chronic pain 01/17/2016   Personal history of tobacco use, presenting hazards to health 11/16/2015   Type 2 diabetes mellitus without complication, with long-term current use of insulin ([place]) 05/12/2013    Past Surgical History:  Procedure Laterality Date   ABDOMINAL HYSTERECTOMY     BREAST BIOPSY Right 1994   neg cyst removed  CATARACT EXTRACTION W/PHACO Right 08/17/2020   Procedure: CATARACT EXTRACTION PHACO AND INTRAOCULAR LENS PLACEMENT (IOC) RIGHT DIABETIC 8.90 00:52.9;  Surgeon: Galen Manila, MD;  Location: [facility];  Service: Ophthalmology;  Laterality: Right;  Diabetic   CHOLECYSTECTOMY     EYE SURGERY  1964, 1966, and 1967   bilateral   FOOT SURGERY Right    cellulitis   PACEMAKER INSERTION     PPM GENERATOR CHANGEOUT N/A 02/24/2020   Procedure: PPM  GENERATOR CHANGEOUT;  Surgeon: Marcina Millard, MD;  Location: [facility] INVAS[facility];  Service: Cardiovascular;  Laterality: N/A;   SPINE SURGERY     cyst removed; [facility]    Prior to Admission medications   Medication Sig Start Date End Date Taking? Authorizing Provider  acetaminophen (TYLENOL) 500 MG tablet Take 500 mg by mouth every 6 (six) hours as needed for mild pain or moderate pain.    [provider]  amLODipine (NORVASC) 10 MG tablet TAKE 1 TABLET BY MOUTH EVERY DAY Patient not taking: Reported on 12/28/2020 08/28/16   Gabriel Cirri, NP  amLODipine (NORVASC) 5 MG tablet Take 5 mg by mouth daily.    [provider]  ASPIRIN LOW DOSE 81 MG chewable tablet Chew 81 mg by mouth daily. 07/27/20   [provider]  benztropine (COGENTIN) 0.5 MG tablet Take 0.5 mg by mouth 2 (two) times daily.    [provider]  brexpiprazole (REXULTI) 2 MG TABS tablet Take 1 mg by mouth at bedtime.    [provider]  calcitRIOL (ROCALTROL) 0.25 MCG capsule Take 0.25 mcg by mouth daily.    [provider]  carbidopa-levodopa (SINEMET IR) 25-100 MG tablet Take 1 tablet by mouth 3 (three) times daily. 07/27/20   [provider]  cholecalciferol (VITAMIN D3) 25 MCG (1000 UNIT) tablet Take 400 Units by mouth daily.     [provider]  citalopram (CELEXA) 20 MG tablet Take 20 mg by mouth daily.    [provider]  DUREZOL 0.05 % EMUL Apply 1 drop to eye 2 (two) times daily. Patient not taking: Reported on 12/26/2020 07/28/20   [provider]  fenofibrate (TRICOR) 48 MG tablet Take 48 mg by mouth daily.    [provider]  fluticasone (FLONASE) 50 MCG/ACT nasal spray Place 2 sprays into both nostrils daily.    [provider]  fluticasone furoate-vilanterol (BREO ELLIPTA) 200-25 MCG/INH AEPB Inhale 1 puff into the lungs daily. 08/31/20   Marrion Coy, MD  gabapentin (NEURONTIN) 100 MG capsule Take  100 mg by mouth at bedtime.    [provider]  guaiFENesin (ROBITUSSIN) 100 MG/5ML liquid Take 5 mLs by mouth every 6 (six) hours as needed for cough or to loosen phlegm. Patient not taking: Reported on 12/28/2020    [provider]  hydroxypropyl methylcellulose / hypromellose (ISOPTO TEARS / GONIOVISC) 2.5 % ophthalmic solution Place 1 drop into the right eye 4 (four) times daily.    [provider]  ILEVRO 0.3 % ophthalmic suspension Place 1 drop into the right eye daily. 12/04/20   [provider]  Lactobacillus Acid-Pectin (ACIDOPHILUS/PECTIN) CAPS Take 1 capsule by mouth in the morning and at bedtime. 07/27/20   [provider]  levothyroxine (SYNTHROID) 150 MCG tablet Take 150 mcg by mouth daily. 07/27/20   [provider]  loperamide (IMODIUM A-D) 2 MG tablet Take 2 mg by mouth 4 (four) times daily as needed for diarrhea or loose stools.  [provider]  losartan (COZAAR) 50 MG tablet Take 50 mg by mouth daily.    [provider]  magnesium oxide (MAG-OX) 400 MG tablet Take 400 mg by mouth daily.    [provider]  metFORMIN (GLUCOPHAGE) 1000 MG tablet Take 0.5 tablets (500 mg total) by mouth 2 (two) times daily with a meal. 05/28/19   Dhungel, Nishant, MD  metoprolol tartrate (LOPRESSOR) 25 MG tablet Take 25 mg by mouth 2 (two) times daily.    [provider]  montelukast (SINGULAIR) 10 MG tablet Take 10 mg by mouth daily.    [provider]  nitroGLYCERIN (NITRODUR - DOSED IN MG/24 HR) 0.2 mg/hr patch Place 0.2 mg onto the skin daily. leave patch on 12-14 hours, then remove for 10-12 hours prior to applying the next patch    [provider]  pantoprazole (PROTONIX) 40 MG tablet Take 40 mg by mouth daily.    [provider]  primidone (MYSOLINE) 50 MG tablet Take 50 mg by mouth 2 (two) times daily.    [provider]  primidone (MYSOLINE) 50 MG tablet Take by mouth 4  (four) times daily. Patient not taking: Reported on 12/28/2020    [provider]  senna-docusate (SENOKOT-S) 8.6-50 MG tablet Take 2 tablets by mouth 2 (two) times daily.    [provider]  sitaGLIPtin (JANUVIA) 50 MG tablet Take 50 mg by mouth daily.    [provider]  torsemide (DEMADEX) 20 MG tablet Take 1 tablet (20 mg total) by mouth daily. 08/30/20   Sharen Hones, MD  traMADol (ULTRAM) 50 MG tablet Take 50 mg by mouth 2 (two) times daily.    [provider]  traZODone (DESYREL) 50 MG tablet Take by mouth at bedtime as needed for sleep.    [provider]  vitamin B-12 (CYANOCOBALAMIN) 1000 MCG tablet Take 2,000 mcg by mouth daily.    [provider]    Allergies Alprazolam, Fentanyl, Meperidine, Meperidine hcl, and Ranitidine hcl  Family History  Problem Relation Age of Onset   Breast cancer Maternal Aunt 51   Cancer Mother        colon   Aneurysm Father     Social History Social History   Tobacco Use   Smoking status: Every Day    Packs/day: 1.00    Years: 50.00    Pack years: 50.00    Types: Cigarettes   Smokeless tobacco: Never  Vaping Use   Vaping Use: Every day  Substance Use Topics   Alcohol use: No   Drug use: No    Review of Systems  Constitutional: No fever/chills Eyes: No visual changes. ENT: No sore throat. Cardiovascular: Denies chest pain. Respiratory: Denies shortness of breath. Gastrointestinal: No abdominal pain.  No nausea, no vomiting.  No diarrhea.  No constipation. Genitourinary: Negative for dysuria. Musculoskeletal: Negative for back pain. Skin: Negative for rash. Neurological: Negative for headaches, focal weakness  ____________________________________________   PHYSICAL EXAM:  VITAL SIGNS: ED Triage Vitals  Enc Vitals Group     BP 01/05/21 1103 (!) 151/62     Pulse Rate 01/05/21 1103 70     Resp 01/05/21 1103 17     Temp 01/05/21 1103 98.2 F (36.8 C)     Temp Source  01/05/21 1103 Oral     SpO2 01/05/21 1103 97 %     Weight --      Height --      Head Circumference --  Peak Flow --      Pain Score 01/05/21 1010 4     Pain Loc --      Pain Edu? --      Excl. in [place]? --    Constitutional: Alert and oriented. Well appearing and in no acute distress as long as she is laying still. Eyes: Conjunctivae are normal. PER Head: Atraumatic. Nose: No congestion/rhinnorhea. Mouth/Throat: Mucous membranes are moist.  Oropharynx non-erythematous. Neck: No stridor.  Cardiovascular: Normal rate, regular rhythm. Grossly normal heart sounds.  Good peripheral circulation. Respiratory: Normal respiratory effort.  No retractions. Lungs CTAB. Gastrointestinal: Soft seems to be tender diffusely to light palpation.  No distention. No abdominal bruits.  Musculoskeletal: Patient complains of pain on palpation of the shoulders bilaterally in the hips and pelvis as well.  She does not seem to be in horrible distress when I palpate them but she says ouch and flinches. Neurologic:  Normal speech and language. No gross focal neurologic deficits are appreciated.  Skin:  Skin is warm, dry and intact. No rash noted.   ____________________________________________   LABS (all labs ordered are listed, but only abnormal results are displayed)  Labs Reviewed  CBG MONITORING, ED   ____________________________________________  EKG   ____________________________________________  RADIOLOGY Gertha Calkin, personally viewed and evaluated these images (plain radiographs) as part of my medical decision making, as well as reviewing the written report by the radiologist.  ED MD interpretation: CT of the head neck  Official radiology report(s): CT HEAD WO CONTRAST (5MM)  Result Date: 01/05/2021 CLINICAL DATA:  Unwitnessed fall.  Trauma to the head. EXAM: CT HEAD WITHOUT CONTRAST TECHNIQUE: Contiguous axial images were obtained from the base of the skull through the vertex  without intravenous contrast. COMPARISON:  12/28/2020 FINDINGS: Brain: Generalized atrophy. No evidence of acute focal infarction, mass lesion, hemorrhage, hydrocephalus or extra-axial collection. Ventriculomegaly is consistent with the degree of atrophy. Vascular: There is atherosclerotic calcification of the major vessels at the base of the brain. Skull: No skull fracture. Sinuses/Orbits: Clear/normal Other: None IMPRESSION: No acute or traumatic finding. Cerebral Atrophy (ICD10-G31.9). Electronically Signed   By: Nelson Chimes M.D.   On: 01/05/2021 11:28   CT Cervical Spine Wo Contrast  Result Date: 01/05/2021 CLINICAL DATA:  Trauma EXAM: CT CERVICAL SPINE WITHOUT CONTRAST TECHNIQUE: Multidetector CT imaging of the cervical spine was performed without intravenous contrast. Multiplanar CT image reconstructions were also generated. COMPARISON:  12/28/2020 FINDINGS: Alignment: There is reversal of lordosis.  Dextroscoliosis is seen. Skull base and vertebrae: There is decrease in number of air cells in mastoids. Soft tissues and spinal canal: Degenerative changes are noted with spinal stenosis and encroachment of neural foramina at multiple levels. Disc levels: Degenerative changes are noted with anterior bony spurs, posterior bony spurs and facet hypertrophy at multiple levels. Upper chest: Visualized upper lung fields are clear. Pacemaker battery is noted in the left infraclavicular region. Other: Prevertebral soft tissues are unremarkable. No significant interval changes are noted. IMPRESSION: No recent fracture is seen. Cervical spondylosis with spinal stenosis and encroachment of neural foramina at multiple levels. Electronically Signed   By: Elmer Picker M.D.   On: 01/05/2021 14:06   CT CHEST ABDOMEN PELVIS WO CONTRAST  Result Date: 01/05/2021 CLINICAL DATA:  [place].  Chest pain with suspected rib fractures. EXAM: CT CHEST, ABDOMEN AND PELVIS WITHOUT CONTRAST TECHNIQUE: Multidetector CT imaging of  the chest, abdomen and pelvis was performed following the standard protocol without IV contrast. COMPARISON:  12/28/2020  FINDINGS: CT CHEST FINDINGS Cardiovascular: Heart size is normal. Pacemaker in place. No pericardial fluid of significance. Coronary artery calcification and stents. Aortic atherosclerotic calcification. Mediastinum/Nodes: No sign of mediastinal or hilar mass or adenopathy or mediastinal vascular injury. Lungs/Pleura: Widespread interstitial prominence suggesting possible early interstitial edema. No effusion. No lobar collapse. Musculoskeletal: No spinal fracture. Old appearing minimal superior endplate depressions at T10 and T11. No acute rib fracture. Old healed rib fracture of the left lateral sixth rib. CT ABDOMEN PELVIS FINDINGS Hepatobiliary: Liver appears normal without contrast. Previous cholecystectomy. Pancreas: Normal Spleen: Normal Adrenals/Urinary Tract: Adrenal glands are normal. Kidneys show vascular calcification and several small nonobstructing stones. No hydronephrosis. No stone in the bladder. Stomach/Bowel: Stomach and small intestine are normal. No acute or significant: Finding. Moderate amount of stool. Vascular/Lymphatic: Aortic atherosclerosis. No aneurysm. IVC is normal. No retroperitoneal adenopathy. Reproductive: Previous hysterectomy.  No pelvic mass. Other: No free fluid or air. Musculoskeletal: Ordinary mild lumbar degenerative changes. IMPRESSION: No traumatic chest finding.  No right-sided rib fracture. Interstitial pulmonary prominence that could reflect early pulmonary edema. No acute or traumatic abdominal finding. Aortic Atherosclerosis (ICD10-I70.0). Small nonobstructing renal calculi. Electronically Signed   By: Nelson Chimes M.D.   On: 01/05/2021 14:01    ____________________________________________   PROCEDURES  Procedure(s) performed (including Critical Care):  Procedures   ____________________________________________   INITIAL IMPRESSION /  ASSESSMENT AND PLAN / ED COURSE   Patient with witnessed fall and multiple complaints of pain.  CT does not reveal any fractures.  Patient seems to be doing well.  Patient just had labs on 1025 of this year.  Its about a week ago.  I do not believe we need to repeat them.  Clinically she does not appear to be anemic.  CBG is 143.  She does not seem to have any other problems clinically.  I will let her go home.          ____________________________________________   FINAL CLINICAL IMPRESSION(S) / ED DIAGNOSES  Final diagnoses:  Fall, initial encounter     ED Discharge Orders     None        Note:  This document was prepared using Dragon voice recognition software and may include unintentional dictation errors.     Nena Polio, MD 01/05/21 [phone]

## 2021-01-05 NOTE — ED Notes (Signed)
See triage note. Pt with witnessed fall by staff. Pt did hit her head. Denies LOC. Pt not on blood thinners known of.

## 2021-01-05 NOTE — ED Triage Notes (Signed)
Pt comes via EMs from [place] [facility] [address] with c/o witnessed fall. Fall was witnessed by staff. Pt did hit head, no loc or blood thinners known of. Pt did state shoulder pain before fall.  VSS

## 2021-01-05 NOTE — ED Notes (Signed)
[facility] called for EMS transport 

## 2021-01-05 NOTE — ED Notes (Signed)
Secretary asked to put in EMS transport for patient back to [place] [facility] [address]

## 2021-01-05 NOTE — ED Notes (Signed)
Pt transported to CT ?

## 2021-01-05 NOTE — Discharge Instructions (Signed)
Please return for any further problems.  Please follow-up with your regular doctor.

## 2021-01-08 ENCOUNTER — Other Ambulatory Visit: Payer: Self-pay

## 2021-01-08 ENCOUNTER — Emergency Department: Payer: Medicare Other

## 2021-01-08 ENCOUNTER — Emergency Department
Admission: EM | Admit: 2021-01-08 | Discharge: 2021-01-08 | Disposition: A | Discharge status: Skilled Nursing Facility | Payer: Medicare Other | Attending: Emergency Medicine | Admitting: Emergency Medicine

## 2021-01-08 DIAGNOSIS — R296 Repeated falls: Secondary | ICD-10-CM | POA: Diagnosis not present

## 2021-01-08 DIAGNOSIS — M7542 Impingement syndrome of left shoulder: Secondary | ICD-10-CM | POA: Insufficient documentation

## 2021-01-08 DIAGNOSIS — F17210 Nicotine dependence, cigarettes, uncomplicated: Secondary | ICD-10-CM | POA: Insufficient documentation

## 2021-01-08 DIAGNOSIS — Z950 Presence of cardiac pacemaker: Secondary | ICD-10-CM | POA: Diagnosis not present

## 2021-01-08 DIAGNOSIS — Z7984 Long term (current) use of oral hypoglycemic drugs: Secondary | ICD-10-CM | POA: Diagnosis not present

## 2021-01-08 DIAGNOSIS — J441 Chronic obstructive pulmonary disease with (acute) exacerbation: Secondary | ICD-10-CM | POA: Insufficient documentation

## 2021-01-08 DIAGNOSIS — Z79899 Other long term (current) drug therapy: Secondary | ICD-10-CM | POA: Insufficient documentation

## 2021-01-08 DIAGNOSIS — I110 Hypertensive heart disease with heart failure: Secondary | ICD-10-CM | POA: Diagnosis not present

## 2021-01-08 DIAGNOSIS — E039 Hypothyroidism, unspecified: Secondary | ICD-10-CM | POA: Diagnosis not present

## 2021-01-08 DIAGNOSIS — H109 Unspecified conjunctivitis: Secondary | ICD-10-CM | POA: Diagnosis not present

## 2021-01-08 DIAGNOSIS — Z7951 Long term (current) use of inhaled steroids: Secondary | ICD-10-CM | POA: Diagnosis not present

## 2021-01-08 DIAGNOSIS — E119 Type 2 diabetes mellitus without complications: Secondary | ICD-10-CM | POA: Insufficient documentation

## 2021-01-08 DIAGNOSIS — I509 Heart failure, unspecified: Secondary | ICD-10-CM | POA: Diagnosis not present

## 2021-01-08 DIAGNOSIS — I2510 Atherosclerotic heart disease of native coronary artery without angina pectoris: Secondary | ICD-10-CM | POA: Diagnosis not present

## 2021-01-08 DIAGNOSIS — Z8616 Personal history of COVID-19: Secondary | ICD-10-CM | POA: Insufficient documentation

## 2021-01-08 DIAGNOSIS — M25512 Pain in left shoulder: Secondary | ICD-10-CM | POA: Diagnosis present

## 2021-01-08 LAB — CBC
HCT: 39.2 % (ref 36.0–46.0)
Hemoglobin: 12.7 g/dL (ref 12.0–15.0)
MCH: 31.3 pg (ref 26.0–34.0)
MCHC: 32.4 g/dL (ref 30.0–36.0)
MCV: 96.6 fL (ref 80.0–100.0)
Platelets: 325 10*3/uL (ref 150–400)
RBC: 4.06 MIL/uL (ref 3.87–5.11)
RDW: 15.2 % (ref 11.5–15.5)
WBC: 9.7 10*3/uL (ref 4.0–10.5)
nRBC: 0 % (ref 0.0–0.2)

## 2021-01-08 LAB — BASIC METABOLIC PANEL
Anion gap: 9 (ref 5–15)
BUN: 30 mg/dL — ABNORMAL HIGH (ref 8–23)
CO2: 26 mmol/L (ref 22–32)
Calcium: 9.9 mg/dL (ref 8.9–10.3)
Chloride: 99 mmol/L (ref 98–111)
Creatinine, Ser: 1.73 mg/dL — ABNORMAL HIGH (ref 0.44–1.00)
GFR, Estimated: 30 mL/min — ABNORMAL LOW (ref >60)
Glucose, Bld: 132 mg/dL — ABNORMAL HIGH (ref 70–99)
Potassium: 4.1 mmol/L (ref 3.5–5.1)
Sodium: 134 mmol/L — ABNORMAL LOW (ref 135–145)

## 2021-01-08 LAB — URINALYSIS, ROUTINE W REFLEX MICROSCOPIC
Bacteria, UA: NONE SEEN
Bilirubin Urine: NEGATIVE
Glucose, UA: NEGATIVE mg/dL
Hgb urine dipstick: NEGATIVE
Ketones, ur: NEGATIVE mg/dL
Leukocytes,Ua: NEGATIVE
Nitrite: NEGATIVE
Protein, ur: 100 mg/dL — AB
Specific Gravity, Urine: 1.009 (ref 1.005–1.030)
pH: 8 (ref 5.0–8.0)

## 2021-01-08 MED ORDER — TRAMADOL HCL 50 MG PO TABS: 50.0000 mg | ORAL_TABLET | Freq: Three times a day (TID) | ORAL | 0 refills | Status: AC | PRN

## 2021-01-08 MED ORDER — ERYTHROMYCIN 5 MG/GM OP OINT: TOPICAL_OINTMENT | OPHTHALMIC | 0 refills | Status: AC

## 2021-01-08 MED ORDER — LOSARTAN POTASSIUM 50 MG PO TABS
50.0000 mg | ORAL_TABLET | ORAL | Status: AC
  Administered 2021-01-08: 50 mg via ORAL
  Filled 2021-01-08: qty 1

## 2021-01-08 MED ORDER — ACETAMINOPHEN 500 MG PO TABS
1000.0000 mg | ORAL_TABLET | ORAL | Status: AC
  Administered 2021-01-08: 1000 mg via ORAL
  Filled 2021-01-08: qty 2

## 2021-01-08 MED ORDER — AMLODIPINE BESYLATE 5 MG PO TABS
5.0000 mg | ORAL_TABLET | Freq: Once | ORAL | Status: AC
  Administered 2021-01-08: 5 mg via ORAL
  Filled 2021-01-08: qty 1

## 2021-01-08 MED ORDER — TRAMADOL HCL 50 MG PO TABS
50.0000 mg | ORAL_TABLET | ORAL | Status: AC
  Administered 2021-01-08: 50 mg via ORAL
  Filled 2021-01-08: qty 1

## 2021-01-08 NOTE — ED Notes (Signed)
Attempted to call report to the [facility], no answer

## 2021-01-08 NOTE — ED Triage Notes (Signed)
Pt in via EMS form the [place] [facility] [address]. Pt was seen here a couple of days ago for a fall. EMS reports since being discharged she has fallen 4-6 times. Pt c/o left shoulder pain. No obvious deformities but moans with small movements 174/93. Per facility, pt with baseline with alertness but weaker.

## 2021-01-08 NOTE — ED Provider Notes (Signed)
Emergency Medicine Provider Triage Evaluation Note  Melanie King , a 75 y.o. female  was evaluated in triage.  Pt complains of left shoulder pain.  Was brought to the ER per EMS for evaluation.  Patient was seen in the emergency department 3 days ago for evaluation after a fall.  She states that her left shoulder has continued to hurt.  Review of Systems  Positive: Left shoulder pain.  History of frequent falls. Negative: Denies head trauma  Physical Exam  BP (!) 169/117 (BP Location: Right Arm)   Pulse 72   Temp 98.4 F (36.9 C) (Oral)   Resp 18   Ht 5\' 1"  (1.549 m)   Wt 66.2 kg   LMP  (LMP Unknown)   SpO2 97%   BMI 27.59 kg/m  Gen:   Awake, no distress, very hard of hearing. Resp:  Normal effort, congested cough but no rales or rhonchi noted at this time. MSK:   Moves lower extremities without difficulty.  Minimal movement of her left shoulder increases her pain.  Unable to abduct.  No obvious deformity is noted.  Radial pulse present.  Motor sensory function intact distal to the injury.  No skin discoloration or soft tissue edema appreciated. Other:    Medical Decision Making  Medically screening exam initiated at 7:55 AM.  Appropriate orders placed.  F Sheetz was informed that the remainder of the evaluation will be completed by another provider, this initial triage assessment does not replace that evaluation, and the importance of remaining in the ED until their evaluation is complete.     [place], PA-C 01/08/21 0813    13/05/22, MD 01/08/21 [phone]

## 2021-01-08 NOTE — ED Provider Notes (Signed)
[facility] Emergency Department Provider Note   ____________________________________________   Event Date/Time   First MD Initiated Contact with Patient 01/08/21 1653     (approximate)  I have reviewed the triage vital signs and the nursing notes.   HISTORY  Chief Complaint Weakness and Fall    HPI Melanie King is a 75 y.o. female history of CHF COPD coronary disease diabetes  Patient here for evaluation after multiple falls.  Patient reports that she falls frequently for the last couple of years she will have often fallen.  She last fell about 3 days ago  She been having pain in her left shoulder since then.  Pain continues to hurt in that area.  Direct report from EMS not available at this time.  Attempted to reach the patient's listed emergency contact no answer  Patient reports that she is not fallen again but continued to have pain in her left shoulder area.  Though she does tell me she falls pretty frequently and regularly.  Often attack occurs while she is standing  Denies chest pain no headache reports chronic neck pain having mild neck pain.  Reports the use of tramadol helps her pain, also occasionally using Tylenol.    Past Medical History:  Diagnosis Date   Arthritis    CHF (congestive heart failure) (HCC)    COPD (chronic obstructive pulmonary disease) (HCC)    Coronary arteriosclerosis    DDD (degenerative disc disease), cervical    DDD (degenerative disc disease), lumbar    Depression    Diabetes mellitus without complication (HCC)    GERD (gastroesophageal reflux disease)    Hyperlipidemia    Hypertension    Osteoarthritis    Pacemaker    Tremor     Patient Active Problem List   Diagnosis Date Noted   Malnutrition of moderate degree 08/28/2020   Elevated troponin 08/26/2020   CHF (congestive heart failure) (HCC) 08/26/2020   Carotid stenosis 04/12/2020   Dysphagia 07/19/2019   Acute respiratory failure due to  COVID-19 (HCC) 07/16/2019   Respiratory distress    Acute on chronic congestive heart failure (HCC)    Acute respiratory failure with hypoxia (HCC) 07/15/2019   Frequent falls 05/26/2019   Generalized weakness 05/26/2019   Acute kidney injury superimposed on CKD lllb (HCC) 03/21/2019   Subdural hematoma 03/20/2019   Cardiac pacemaker 03/17/2019   Hypothyroidism 03/10/2019   Depression    Acute on chronic diastolic CHF (congestive heart failure) (HCC)    Acute metabolic encephalopathy    Fall    Tobacco abuse    C6 cervical fracture (HCC)    Left hand pain 04/01/2018   Numbness and tingling in left hand 04/01/2018   Left hand weakness 03/05/2018   Confusion 05/12/2017   Seizure (HCC) 05/10/2017   Elevated sedimentation rate 04/04/2017   Elevated C-reactive protein (CRP) 04/04/2017   Chest pain, unspecified 04/02/2017   Bilateral lower extremity pain (Secondary Area of Pain) (R>L) 04/02/2017   Chronic hip pain, bilateral  (Secondary Area of Pain) (R>L) 04/02/2017   Chronic bilateral low back pain with bilateral sciatica [facility] Area of Pain) (R>L) 04/02/2017   Chronic pain syndrome 04/02/2017   Disorder of bone, unspecified 04/02/2017   Other long term (current) drug therapy 04/02/2017   Other specified health status 04/02/2017   Long term current use of opiate analgesic 04/02/2017   Hip pain, bilateral 12/06/2016   Tremor 05/27/2016   Trigger finger of left hand 02/16/2016   Osteoarthritis  of both hands 02/16/2016   Angina pectoris ([place]) 01/17/2016   COPD with acute exacerbation ([place]) 01/17/2016   CAD (coronary artery disease) 01/17/2016   HLD (hyperlipidemia) 01/17/2016   Hypertension 01/17/2016   Sick sinus syndrome ([place]) 01/17/2016   Chronic pain 01/17/2016   Personal history of tobacco use, presenting hazards to health 11/16/2015   Type 2 diabetes mellitus without complication, with long-term current use of insulin ([place]) 05/12/2013    Past Surgical History:   Procedure Laterality Date   ABDOMINAL HYSTERECTOMY     BREAST BIOPSY Right 1994   neg cyst removed   CATARACT EXTRACTION W/PHACO Right 08/17/2020   Procedure: CATARACT EXTRACTION PHACO AND INTRAOCULAR LENS PLACEMENT (IOC) RIGHT DIABETIC 8.90 00:52.9;  Surgeon: Birder Robson, MD;  Location: [place];  Service: Ophthalmology;  Laterality: Right;  Diabetic   CHOLECYSTECTOMY     EYE SURGERY  1964, 1966, and 1967   bilateral   FOOT SURGERY Right    cellulitis   PACEMAKER INSERTION     PPM GENERATOR CHANGEOUT N/A 02/24/2020   Procedure: PPM GENERATOR CHANGEOUT;  Surgeon: Isaias Cowman, MD;  Location: [place] CV LAB;  Service: Cardiovascular;  Laterality: N/A;   SPINE SURGERY     cyst removed; [facility]    Prior to Admission medications   Medication Sig Start Date End Date Taking? Authorizing Provider  erythromycin ophthalmic ointment Apply a thin strip of ointment (about 1/4") to the lower eyelid of each affected eye every 6 hours x 1 week. 01/08/21  Yes Delman Kitten, MD  traMADol (ULTRAM) 50 MG tablet Take 1 tablet (50 mg total) by mouth every 8 (eight) hours as needed for moderate pain (left shoulder). 01/08/21  Yes Delman Kitten, MD  acetaminophen (TYLENOL) 500 MG tablet Take 500 mg by mouth every 6 (six) hours as needed for mild pain or moderate pain.    [provider]  amLODipine (NORVASC) 10 MG tablet TAKE 1 TABLET BY MOUTH EVERY DAY Patient not taking: Reported on 12/28/2020 08/28/16   Kathrine Haddock, NP  amLODipine (NORVASC) 5 MG tablet Take 5 mg by mouth daily.    [provider]  ASPIRIN LOW DOSE 81 MG chewable tablet Chew 81 mg by mouth daily. 07/27/20   [provider]  benztropine (COGENTIN) 0.5 MG tablet Take 0.5 mg by mouth 2 (two) times daily.    [provider]  brexpiprazole (REXULTI) 2 MG TABS tablet Take 1 mg by mouth at bedtime.    [provider]  calcitRIOL (ROCALTROL) 0.25 MCG capsule Take 0.25 mcg  by mouth daily.    [provider]  carbidopa-levodopa (SINEMET IR) 25-100 MG tablet Take 1 tablet by mouth 3 (three) times daily. 07/27/20   [provider]  cholecalciferol (VITAMIN D3) 25 MCG (1000 UNIT) tablet Take 400 Units by mouth daily.     [provider]  citalopram (CELEXA) 20 MG tablet Take 20 mg by mouth daily.    [provider]  DUREZOL 0.05 % EMUL Apply 1 drop to eye 2 (two) times daily. Patient not taking: Reported on 12/26/2020 07/28/20   [provider]  fenofibrate (TRICOR) 48 MG tablet Take 48 mg by mouth daily.    [provider]  fluticasone (FLONASE) 50 MCG/ACT nasal spray Place 2 sprays into both nostrils daily.    [provider]  fluticasone furoate-vilanterol (BREO ELLIPTA) 200-25 MCG/INH AEPB Inhale 1 puff into the lungs daily. 08/31/20   Sharen Hones, MD  gabapentin (NEURONTIN) 100  MG capsule Take 100 mg by mouth at bedtime.    [provider]  guaiFENesin (ROBITUSSIN) 100 MG/5ML liquid Take 5 mLs by mouth every 6 (six) hours as needed for cough or to loosen phlegm. Patient not taking: Reported on 12/28/2020    [provider]  hydroxypropyl methylcellulose / hypromellose (ISOPTO TEARS / GONIOVISC) 2.5 % ophthalmic solution Place 1 drop into the right eye 4 (four) times daily.    [provider]  ILEVRO 0.3 % ophthalmic suspension Place 1 drop into the right eye daily. 12/04/20   [provider]  Lactobacillus Acid-Pectin (ACIDOPHILUS/PECTIN) CAPS Take 1 capsule by mouth in the morning and at bedtime. 07/27/20   [provider]  levothyroxine (SYNTHROID) 150 MCG tablet Take 150 mcg by mouth daily. 07/27/20   [provider]  loperamide (IMODIUM A-D) 2 MG tablet Take 2 mg by mouth 4 (four) times daily as needed for diarrhea or loose stools.    [provider]  losartan (COZAAR) 50 MG tablet Take 50 mg by mouth daily.    [provider]   magnesium oxide (MAG-OX) 400 MG tablet Take 400 mg by mouth daily.    [provider]  metFORMIN (GLUCOPHAGE) 1000 MG tablet Take 0.5 tablets (500 mg total) by mouth 2 (two) times daily with a meal. 05/28/19   Dhungel, Nishant, MD  metoprolol tartrate (LOPRESSOR) 25 MG tablet Take 25 mg by mouth 2 (two) times daily.    [provider]  montelukast (SINGULAIR) 10 MG tablet Take 10 mg by mouth daily.    [provider]  nitroGLYCERIN (NITRODUR - DOSED IN MG/24 HR) 0.2 mg/hr patch Place 0.2 mg onto the skin daily. leave patch on 12-14 hours, then remove for 10-12 hours prior to applying the next patch    [provider]  pantoprazole (PROTONIX) 40 MG tablet Take 40 mg by mouth daily.    [provider]  primidone (MYSOLINE) 50 MG tablet Take 50 mg by mouth 2 (two) times daily.    [provider]  primidone (MYSOLINE) 50 MG tablet Take by mouth 4 (four) times daily. Patient not taking: Reported on 12/28/2020    [provider]  senna-docusate (SENOKOT-S) 8.6-50 MG tablet Take 2 tablets by mouth 2 (two) times daily.    [provider]  sitaGLIPtin (JANUVIA) 50 MG tablet Take 50 mg by mouth daily.    [provider]  torsemide (DEMADEX) 20 MG tablet Take 1 tablet (20 mg total) by mouth daily. 08/30/20   Sharen Hones, MD  traZODone (DESYREL) 50 MG tablet Take by mouth at bedtime as needed for sleep.    [provider]  vitamin B-12 (CYANOCOBALAMIN) 1000 MCG tablet Take 2,000 mcg by mouth daily.    [provider]    Allergies Alprazolam, Fentanyl, Meperidine, Meperidine hcl, and Ranitidine hcl  Family History  Problem Relation Age of Onset   Breast cancer Maternal Aunt 70   Cancer Mother        colon   Aneurysm Father     Social History Social History   Tobacco Use   Smoking status: Every Day    Packs/day: 1.00    Years: 50.00    Pack years: 50.00    Types: Cigarettes   Smokeless tobacco:  Never  Vaping Use   Vaping Use: Every day  Substance Use Topics   Alcohol use: No   Drug use: No    Review of Systems Constitutional: No fever/chills or  recent illness but does report she has fallen several times her left shoulder is bothering her Eyes: No visual changes. ENT: No sore throat. Cardiovascular: Denies chest pain. Respiratory: Denies shortness of breath. Gastrointestinal: No abdominal pain.   Genitourinary: Negative for dysuria. Musculoskeletal: Negative for back pain.  Reports pain in her left shoulder denies pain in the hand or other joints.  No pain in her hips.  Reports chronic low back pain nothing new.  Pain is located over the left shoulder she touches over her left deltoid region Skin: Negative for rash. Neurological: Negative for headaches, areas of focal weakness or numbness.   Resides at [facility] ____________________________________________   PHYSICAL EXAM:  VITAL SIGNS: ED Triage Vitals  Enc Vitals Group     BP 01/08/21 0747 (!) 169/117     Pulse Rate 01/08/21 0747 72     Resp 01/08/21 0747 18     Temp 01/08/21 0747 98.4 F (36.9 C)     Temp Source 01/08/21 0747 Oral     SpO2 01/08/21 0747 97 %     Weight 01/08/21 0747 146 lb (66.2 kg)     Height 01/08/21 0747 5\' 1"  (1.549 m)     Head Circumference --      Peak Flow --      Pain Score 01/08/21 0756 7     Pain Loc --      Pain Edu? --      Excl. in [place]? --     Constitutional: Alert and oriented to being at hospital where she lives but not well oriented to year. Well appearing and in no acute distress. Eyes: Conjunctivae are normal. Head: Atraumatic. Nose: No congestion/rhinnorhea. Mouth/Throat: Mucous membranes are moist. Neck: No stridor.  No cervical motion tenderness.  Good range of motion of the neck. Cardiovascular: Normal rate, regular rhythm. Grossly normal heart sounds.  Good peripheral circulation. Respiratory: Normal respiratory effort.  No retractions. Lungs  CTAB. Gastrointestinal: Soft and nontender. No distention. Musculoskeletal: No lower extremity tenderness nor edema.  Moves hips knees ankles and feet well.  Warm and well-perfused in all extremities.  Strong left upper extremity radial pulse.  Normal motor or sensory exam left upper extremity.  Left upper extremity does demonstrate some pain and limitation of movement with abduction of the left shoulder.  Tender over the left deltoid region but no obvious hematoma or contusion.  Good range of motion of the remainder of the left upper extremity no pain at the elbow wrist or hand  Right upper extremity full range of motion without pain or discomfort. Neurologic:  Normal speech and language. No gross focal neurologic deficits are appreciated.  Skin:  Skin is warm, dry and intact. No rash noted. Psychiatric: Mood and affect are normal. Speech and behavior are normal.  ____________________________________________   LABS (all labs ordered are listed, but only abnormal results are displayed)  Labs Reviewed  BASIC METABOLIC PANEL - Abnormal; Notable for the following components:      Result Value   Sodium 134 (*)    Glucose, Bld 132 (*)    BUN 30 (*)    Creatinine, Ser 1.73 (*)    GFR, Estimated 30 (*)    All other components within normal limits  URINALYSIS, ROUTINE W REFLEX MICROSCOPIC - Abnormal; Notable for the following components:   Color, Urine STRAW (*)    APPearance CLEAR (*)    Protein, ur 100 (*)    All other components within normal limits  CBC  CBG MONITORING, ED    Labs reviewed by me, creatinine slightly elevated but at her or near normal baseline.  And GFR at baseline as well.  CBC normal.  Glucose normal.  EKG ____________________________________________  EKG  Reviewed inter by me at 8 AM Heart rate 79 QRS 120 QTc 480 Normal sinus rhythm no evidence of acute ischemia ____________________________________________  RADIOLOGY  CT HEAD WO CONTRAST (5MM)  Result  Date: 01/08/2021 CLINICAL DATA:  Unwitnessed fall.  Facial trauma EXAM: CT HEAD WITHOUT CONTRAST CT CERVICAL SPINE WITHOUT CONTRAST TECHNIQUE: Multidetector CT imaging of the head and cervical spine was performed following the standard protocol without intravenous contrast. Multiplanar CT image reconstructions of the cervical spine were also generated. COMPARISON:  CT head 01/05/2021 FINDINGS: CT HEAD FINDINGS Brain: Moderate atrophy. Mild chronic microvascular ischemic change in the white matter and basal ganglia. Negative for acute infarct, hemorrhage, mass. Vascular: Negative for hyperdense vessel Skull: Negative Sinuses/Orbits: Paranasal sinuses clear. Bilateral cataract extraction Other: None CT CERVICAL SPINE FINDINGS Alignment: Mild anterolisthesis C4-5 Skull base and vertebrae: Negative for fracture Soft tissues and spinal canal: No acute soft tissue abnormality. Left-sided pacemaker Disc levels: Multilevel disc and facet degeneration with spurring. Multilevel spinal and foraminal stenosis due to spurring. Upper chest: Lung apices clear bilaterally. Other: None IMPRESSION: 1. No acute intracranial abnormality. Atrophy and chronic microvascular ischemia 2. Negative for cervical spine fracture.  Moderate spondylosis. Electronically Signed   By: Franchot Gallo M.D.   On: 01/08/2021 17:51   CT Cervical Spine Wo Contrast  Result Date: 01/08/2021 CLINICAL DATA:  Unwitnessed fall.  Facial trauma EXAM: CT HEAD WITHOUT CONTRAST CT CERVICAL SPINE WITHOUT CONTRAST TECHNIQUE: Multidetector CT imaging of the head and cervical spine was performed following the standard protocol without intravenous contrast. Multiplanar CT image reconstructions of the cervical spine were also generated. COMPARISON:  CT head 01/05/2021 FINDINGS: CT HEAD FINDINGS Brain: Moderate atrophy. Mild chronic microvascular ischemic change in the white matter and basal ganglia. Negative for acute infarct, hemorrhage, mass. Vascular: Negative for  hyperdense vessel Skull: Negative Sinuses/Orbits: Paranasal sinuses clear. Bilateral cataract extraction Other: None CT CERVICAL SPINE FINDINGS Alignment: Mild anterolisthesis C4-5 Skull base and vertebrae: Negative for fracture Soft tissues and spinal canal: No acute soft tissue abnormality. Left-sided pacemaker Disc levels: Multilevel disc and facet degeneration with spurring. Multilevel spinal and foraminal stenosis due to spurring. Upper chest: Lung apices clear bilaterally. Other: None IMPRESSION: 1. No acute intracranial abnormality. Atrophy and chronic microvascular ischemia 2. Negative for cervical spine fracture.  Moderate spondylosis. Electronically Signed   By: Franchot Gallo M.D.   On: 01/08/2021 17:51   DG Humerus Left  Result Date: 01/08/2021 CLINICAL DATA:  Recent fall.  Left arm pain EXAM: LEFT HUMERUS - 2+ VIEW COMPARISON:  None. FINDINGS: Normal alignment no fracture. Rotator cuff impingement with downsloping acromion. Pacemaker noted. IMPRESSION: Negative for fracture.  Rotator cuff impingement Electronically Signed   By: Franchot Gallo M.D.   On: 01/08/2021 17:45       ____________________________________________   PROCEDURES  Procedure(s) performed: None  Procedures  Critical Care performed: No  ____________________________________________   INITIAL IMPRESSION / ASSESSMENT AND PLAN / ED COURSE  Pertinent labs & imaging results that were available during my care of the patient were reviewed by me and considered in my medical decision making (see chart for details).   Patient presents after pain from the left shoulder and a history of multiple falls.  She denies an interim fall, but she is not an  excellent historian has a history of multiple falls in the past.  Will obtain imaging in the event that she has fallen once again, including imaging of the left humerus, CT head and cervical spine.  Labs reviewed appear to be at baseline.  Check urinalysis given the report of  multiple falls recently and assure no evidence of UTI as well.  She otherwise appears medically stable without evidence of acute distress other than isolated pain in the left shoulder.  Clinical exam without evidence of trauma except for left shoulder pain  Clinical Course as of 01/08/21 1843  Sat Jan 08, 2021  1704 Unable to reach patient's listed emergency contact Mrs. Berenice Primas.  Phone call made but no voicemail available [MQ]  1756 CT head and cervical spine are reviewed negative for acute pathology.  Left humerus imaging negative for acute fracture, but suspicious for possible rotator impingement.  Given the patient's clinical history pain with movement recent fall this may very well be the case.  Make recommendation to follow-up with orthopedics.  Suspect possible rotator cuff impingement or contusion contributing the patient's persistent pain in the left shoulder. [MQ]  1835 Patient evaluation reassuring.  Resting comfortably no distress.  Blood pressure is notably elevated, reviewed her facility med rec and she has not been administered her antihypertensives today.  Ordered antihypertensives prior to discharge. [MQ]    Clinical Course User Index [MQ] Delman Kitten, MD   ----------------------------------------- 6:43 PM on 01/08/2021 ----------------------------------------- Patient resting reports her pain is better after receiving medication here.  She reports use of tramadol in the past and has been helpful for generalized pains, and I do see this in prescriber database.  Congruently not taking any prescription pain medicine.  We will give prescription for short course of tramadol, also noted on reassessment that she has some very mild but slight purulent appearance of discharge about the right eye.  She reports that she has frequent dry eyes and will develop a film over the eyes uses rewetting drops, but in this case I suspect she may have some very mild conjunctivitis on the right.  No  surrounding erythema redness proptosis or evidence of a cellulitis.  Will prescribe erythromycin cream, also prescription for tramadol.  Discussed with patient recommendation of follow-up with her primary doctor as well as with orthopedics regarding what appears to be possibly rotator cuff impingement or potential more chronic left shoulder etiology for pain.  Return precautions and treatment recommendations and follow-up discussed with the patient who is agreeable with the plan.   ____________________________________________   FINAL CLINICAL IMPRESSION(S) / ED DIAGNOSES  Final diagnoses:  Rotator cuff impingement syndrome of left shoulder  Frequent falls  Conjunctivitis of right eye, unspecified conjunctivitis type        Note:  This document was prepared using Dragon voice recognition software and may include unintentional dictation errors       Delman Kitten, MD 01/08/21 1844

## 2021-01-27 ENCOUNTER — Emergency Department: Payer: Medicare Other

## 2021-01-27 ENCOUNTER — Encounter: Payer: Self-pay | Admitting: Emergency Medicine

## 2021-01-27 ENCOUNTER — Emergency Department
Admission: EM | Admit: 2021-01-27 | Discharge: 2021-01-27 | Disposition: A | Discharge status: Home / Self Care | Payer: Medicare Other | Attending: Emergency Medicine | Admitting: Emergency Medicine

## 2021-01-27 ENCOUNTER — Other Ambulatory Visit: Payer: Self-pay

## 2021-01-27 DIAGNOSIS — Z7982 Long term (current) use of aspirin: Secondary | ICD-10-CM | POA: Insufficient documentation

## 2021-01-27 DIAGNOSIS — Z79899 Other long term (current) drug therapy: Secondary | ICD-10-CM | POA: Insufficient documentation

## 2021-01-27 DIAGNOSIS — N1832 Chronic kidney disease, stage 3b: Secondary | ICD-10-CM | POA: Diagnosis not present

## 2021-01-27 DIAGNOSIS — I2510 Atherosclerotic heart disease of native coronary artery without angina pectoris: Secondary | ICD-10-CM | POA: Insufficient documentation

## 2021-01-27 DIAGNOSIS — R4182 Altered mental status, unspecified: Secondary | ICD-10-CM | POA: Diagnosis present

## 2021-01-27 DIAGNOSIS — Z20822 Contact with and (suspected) exposure to covid-19: Secondary | ICD-10-CM | POA: Insufficient documentation

## 2021-01-27 DIAGNOSIS — F17210 Nicotine dependence, cigarettes, uncomplicated: Secondary | ICD-10-CM | POA: Insufficient documentation

## 2021-01-27 DIAGNOSIS — E1122 Type 2 diabetes mellitus with diabetic chronic kidney disease: Secondary | ICD-10-CM | POA: Diagnosis not present

## 2021-01-27 DIAGNOSIS — Z7984 Long term (current) use of oral hypoglycemic drugs: Secondary | ICD-10-CM | POA: Insufficient documentation

## 2021-01-27 DIAGNOSIS — Z950 Presence of cardiac pacemaker: Secondary | ICD-10-CM | POA: Insufficient documentation

## 2021-01-27 DIAGNOSIS — E039 Hypothyroidism, unspecified: Secondary | ICD-10-CM | POA: Insufficient documentation

## 2021-01-27 DIAGNOSIS — Z7951 Long term (current) use of inhaled steroids: Secondary | ICD-10-CM | POA: Diagnosis not present

## 2021-01-27 DIAGNOSIS — J449 Chronic obstructive pulmonary disease, unspecified: Secondary | ICD-10-CM | POA: Diagnosis not present

## 2021-01-27 DIAGNOSIS — R531 Weakness: Secondary | ICD-10-CM | POA: Diagnosis not present

## 2021-01-27 DIAGNOSIS — I130 Hypertensive heart and chronic kidney disease with heart failure and stage 1 through stage 4 chronic kidney disease, or unspecified chronic kidney disease: Secondary | ICD-10-CM | POA: Insufficient documentation

## 2021-01-27 DIAGNOSIS — I5033 Acute on chronic diastolic (congestive) heart failure: Secondary | ICD-10-CM | POA: Insufficient documentation

## 2021-01-27 DIAGNOSIS — R410 Disorientation, unspecified: Secondary | ICD-10-CM | POA: Insufficient documentation

## 2021-01-27 DIAGNOSIS — Z8616 Personal history of COVID-19: Secondary | ICD-10-CM | POA: Insufficient documentation

## 2021-01-27 LAB — COMPREHENSIVE METABOLIC PANEL
ALT: 6 U/L (ref 0–44)
AST: 18 U/L (ref 15–41)
Albumin: 4.3 g/dL (ref 3.5–5.0)
Alkaline Phosphatase: 74 U/L (ref 38–126)
Anion gap: 8 (ref 5–15)
BUN: 56 mg/dL — ABNORMAL HIGH (ref 8–23)
CO2: 27 mmol/L (ref 22–32)
Calcium: 10.4 mg/dL — ABNORMAL HIGH (ref 8.9–10.3)
Chloride: 101 mmol/L (ref 98–111)
Creatinine, Ser: 2.7 mg/dL — ABNORMAL HIGH (ref 0.44–1.00)
GFR, Estimated: 18 mL/min — ABNORMAL LOW (ref >60)
Glucose, Bld: 139 mg/dL — ABNORMAL HIGH (ref 70–99)
Potassium: 4 mmol/L (ref 3.5–5.1)
Sodium: 136 mmol/L (ref 135–145)
Total Bilirubin: 0.6 mg/dL (ref 0.3–1.2)
Total Protein: 8.7 g/dL — ABNORMAL HIGH (ref 6.5–8.1)

## 2021-01-27 LAB — CBC
HCT: 38.8 % (ref 36.0–46.0)
Hemoglobin: 12.5 g/dL (ref 12.0–15.0)
MCH: 31.3 pg (ref 26.0–34.0)
MCHC: 32.2 g/dL (ref 30.0–36.0)
MCV: 97 fL (ref 80.0–100.0)
Platelets: 302 10*3/uL (ref 150–400)
RBC: 4 MIL/uL (ref 3.87–5.11)
RDW: 15.3 % (ref 11.5–15.5)
WBC: 8.5 10*3/uL (ref 4.0–10.5)
nRBC: 0 % (ref 0.0–0.2)

## 2021-01-27 LAB — URINALYSIS, COMPLETE (UACMP) WITH MICROSCOPIC
Bilirubin Urine: NEGATIVE
Glucose, UA: NEGATIVE mg/dL
Hgb urine dipstick: NEGATIVE
Ketones, ur: NEGATIVE mg/dL
Leukocytes,Ua: NEGATIVE
Nitrite: NEGATIVE
Protein, ur: 30 mg/dL — AB
Specific Gravity, Urine: 1.015 (ref 1.005–1.030)
Squamous Epithelial / HPF: NONE SEEN (ref 0–5)
pH: 5.5 (ref 5.0–8.0)

## 2021-01-27 LAB — RESP PANEL BY RT-PCR (FLU A&B, COVID) ARPGX2
Influenza A by PCR: NEGATIVE
Influenza B by PCR: NEGATIVE
SARS Coronavirus 2 by RT PCR: NEGATIVE

## 2021-01-27 MED ORDER — SODIUM CHLORIDE 0.9 % IV BOLUS
1000.0000 mL | Freq: Once | INTRAVENOUS | Status: AC
  Administered 2021-01-27: 1000 mL via INTRAVENOUS

## 2021-01-27 NOTE — ED Provider Notes (Signed)
Emergency Medicine Provider Triage Evaluation Note  Melanie King, a 75 y.o. female  was evaluated in triage.  Pt complains of AMS.  Patient presents via EMS from the [facility], with AMS x2.  There is some reported concern for failure to thrive with increased symptoms over the last 2 days.  Patient is oriented to self, and will follow verbal commands.  Review of Systems  Positive: AMS Negative: FCS  Physical Exam  BP (!) 180/68 (BP Location: Left Arm)   Pulse 80   Temp 97.7 F (36.5 C) (Oral)   Resp 16   Wt 66 kg   LMP  (LMP Unknown)   SpO2 99%   BMI 27.49 kg/m  Gen:   Awake, no distress  NAD Resp:  Normal effort CTA MSK:   Moves extremities without difficulty  Other:  CVS: RRR  Medical Decision Making  Medically screening exam initiated at 11:50 AM.  Appropriate orders placed.  Melanie King was informed that the remainder of the evaluation will be completed by another provider, this initial triage assessment does not replace that evaluation, and the importance of remaining in the ED until their evaluation is complete.  Geriatric patient with ED evaluation of symptoms concerning for altered mental status patient presents from her facility with symptoms for the last 2 weeks with a recent decline over the last 2 days.   Lissa Hoard, PA-C 01/27/21 1151    Chesley Noon, MD 01/27/21 [id number]

## 2021-01-27 NOTE — Discharge Instructions (Addendum)
Her labs show an elevated kidney function from some dehydration.  We have given her some fluids.  With her having dementia she will sometimes forget to eat and drink so is important to encourage her.  She was able to drink water with encouragement.  Her CT scan is as below.  It looks stable from her prior CT scans here so may be nothing at all but she needs to call the neurology team to get follow-up to make sure that she is had MRI and full neurological work-up to see if they feel like patient needs a referral to neurosurgery for lumbar puncture to drain some of the fluid to see if that could improve her symptoms or if the neurologist just feels this is related to Alzheimer's.  But this does not need an acute intervention today  Return to the ER for worsening confusion fevers or any other    IMPRESSION: 1. No acute intracranial hemorrhage or mass effect. Stable prominence of the ventricles which may be out of proportion to volume loss and could be seen with normal pressure hydrocephalus, correlate clinically. 2. Chronic microvascular ischemic changes and small old lacunar infarcts.

## 2021-01-27 NOTE — ED Notes (Signed)
C-COM call for transport to the [facility]

## 2021-01-27 NOTE — ED Provider Notes (Signed)
3:18 PM Assumed care for off going team.   Blood pressure (!) 186/69, pulse 61, temperature 97.7 F (36.5 C), temperature source Oral, resp. rate 17, weight 66 kg, SpO2 97 %.  See their HPI for full report but in brief UA, COVID, CT---[facility]- 2 weeks confused working her up for dementia. 2 days of weakness and less mobile and not eating or drinking. 1.8-->2.7 getting 1L fluids.   Patient does not have any husband or children to call.  I called be contacted listed who stated that she has not seen her in years and that it is her brother who is friends with her but the brother is sick.  I discussed with Dr. Adriana Simas the CT imaging concerning for normal pressure hydrocephalus.  All of her CTs have been similar when she is come here.  He said that usually this is just related to atrophy and that she would need a full neurology work-up including MRI and if they felt that there was no other cause of the symptoms they can refer them to neurosurgery to consider a lumbar drain vs high-volume LP drain and if there is improvement then they would consider putting a shunt in but again this is very rare and usually it is from just the atrophy from the Alzheimer's.  I called the facility who states they are not sure why patient was sent here but they feel comfortable with patient coming back and understand that she needs to follow-up with neurology for further work-up and care return to the ER if her symptoms are worsening  I discussed the provisional nature of ED diagnosis, the treatment so far, the ongoing plan of care, follow up appointments and return precautions with the patient and any family or support people present. They expressed understanding and agreed with the plan, discharged home.            Concha Se, MD 01/27/21 1739

## 2021-01-27 NOTE — ED Triage Notes (Signed)
Pt comes into the ED via [facility] from [facility] c/o AMS x 2 weeks which has gotten worse for the past 2 days and now is being considered a failure to thrive.  Possible recent dementia diagnoses per EMS, but not listed on paperwork.  Pt does follow commands but is hard of hearing.    162 CBG 175/86 96% RA 103 HR 98.4 oral

## 2021-01-27 NOTE — ED Notes (Signed)
Attempted to call report to "[facility]" with no answer.

## 2021-01-27 NOTE — ED Notes (Signed)
Patient transported to CT 

## 2021-01-27 NOTE — ED Provider Notes (Signed)
[facility] Emergency Department Provider Note  Time seen: 2:26 PM  I have reviewed the triage vital signs and the nursing notes.   HISTORY  Chief Complaint Altered Mental Status   HPI Melanie King is a 75 y.o. female with a past medical history of CHF, COPD, diabetes, gastric reflux, hypertension, hyperlipidemia, presents to the emergency department for 2 weeks of confusion now with decreased intake and weakness.  According to EMS report patient is coming from the [facility] where she has been noted to be more confused over the past 2 weeks with progressive weakness over the past 2 days as well as decreased oral intake.  They were concerned so they sent the patient to the emergency department for evaluation.  Here the patient is awake calm and pleasant, does not know why she is here but has no complaints.  Is oriented to person only.   Past Medical History:  Diagnosis Date   Arthritis    CHF (congestive heart failure) (HCC)    COPD (chronic obstructive pulmonary disease) (HCC)    Coronary arteriosclerosis    DDD (degenerative disc disease), cervical    DDD (degenerative disc disease), lumbar    Depression    Diabetes mellitus without complication (HCC)    GERD (gastroesophageal reflux disease)    Hyperlipidemia    Hypertension    Osteoarthritis    Pacemaker    Tremor     Patient Active Problem List   Diagnosis Date Noted   Malnutrition of moderate degree 08/28/2020   Elevated troponin 08/26/2020   CHF (congestive heart failure) ([place]) 08/26/2020   Carotid stenosis 04/12/2020   Dysphagia 07/19/2019   Acute respiratory failure due to COVID-19 ([place]) 07/16/2019   Respiratory distress    Acute on chronic congestive heart failure ([place])    Acute respiratory failure with hypoxia ([place]) 07/15/2019   Frequent falls 05/26/2019   Generalized weakness 05/26/2019   Acute kidney injury superimposed on CKD lllb ([place]) 03/21/2019   Subdural hematoma  03/20/2019   Cardiac pacemaker 03/17/2019   Hypothyroidism 03/10/2019   Depression    Acute on chronic diastolic CHF (congestive heart failure) ([employer])    Acute metabolic encephalopathy    Fall    Tobacco abuse    C6 cervical fracture (HCC)    Left hand pain 04/01/2018   Numbness and tingling in left hand 04/01/2018   Left hand weakness 03/05/2018   Confusion 05/12/2017   Seizure ([place]) 05/10/2017   Elevated sedimentation rate 04/04/2017   Elevated C-reactive protein (CRP) 04/04/2017   Chest pain, unspecified 04/02/2017   Bilateral lower extremity pain (Secondary Area of Pain) (R>L) 04/02/2017   Chronic hip pain, bilateral  (Secondary Area of Pain) (R>L) 04/02/2017   Chronic bilateral low back pain with bilateral sciatica [facility] Area of Pain) (R>L) 04/02/2017   Chronic pain syndrome 04/02/2017   Disorder of bone, unspecified 04/02/2017   Other long term (current) drug therapy 04/02/2017   Other specified health status 04/02/2017   Long term current use of opiate analgesic 04/02/2017   Hip pain, bilateral 12/06/2016   Tremor 05/27/2016   Trigger finger of left hand 02/16/2016   Osteoarthritis of both hands 02/16/2016   Angina pectoris ([place]) 01/17/2016   COPD with acute exacerbation ([place]) 01/17/2016   CAD (coronary artery disease) 01/17/2016   HLD (hyperlipidemia) 01/17/2016   Hypertension 01/17/2016   Sick sinus syndrome ([place]) 01/17/2016   Chronic pain 01/17/2016   Personal history of tobacco use, presenting hazards to  health 11/16/2015   Type 2 diabetes mellitus without complication, with long-term current use of insulin (HCC) 05/12/2013    Past Surgical History:  Procedure Laterality Date   ABDOMINAL HYSTERECTOMY     BREAST BIOPSY Right 1994   neg cyst removed   CATARACT EXTRACTION W/PHACO Right 08/17/2020   Procedure: CATARACT EXTRACTION PHACO AND INTRAOCULAR LENS PLACEMENT (IOC) RIGHT DIABETIC 8.90 00:52.9;  Surgeon: Galen Manila, MD;  Location: [facility];  Service: Ophthalmology;  Laterality: Right;  Diabetic   CHOLECYSTECTOMY     EYE SURGERY  1964, 1966, and 1967   bilateral   FOOT SURGERY Right    cellulitis   PACEMAKER INSERTION     PPM GENERATOR CHANGEOUT N/A 02/24/2020   Procedure: PPM GENERATOR CHANGEOUT;  Surgeon: Marcina Millard, MD;  Location: [facility] INVASIVE CV LAB;  Service: Cardiovascular;  Laterality: N/A;   SPINE SURGERY     cyst removed; [facility]    Prior to Admission medications   Medication Sig Start Date End Date Taking? Authorizing Provider  acetaminophen (TYLENOL) 500 MG tablet Take 500 mg by mouth every 6 (six) hours as needed for mild pain or moderate pain.    [provider]  amLODipine (NORVASC) 10 MG tablet TAKE 1 TABLET BY MOUTH EVERY DAY Patient not taking: Reported on 12/28/2020 08/28/16   Gabriel Cirri, NP  amLODipine (NORVASC) 5 MG tablet Take 5 mg by mouth daily.    [provider]  ASPIRIN LOW DOSE 81 MG chewable tablet Chew 81 mg by mouth daily. 07/27/20   [provider]  benztropine (COGENTIN) 0.5 MG tablet Take 0.5 mg by mouth 2 (two) times daily.    [provider]  brexpiprazole (REXULTI) 2 MG TABS tablet Take 1 mg by mouth at bedtime.    [provider]  calcitRIOL (ROCALTROL) 0.25 MCG capsule Take 0.25 mcg by mouth daily.    [provider]  carbidopa-levodopa (SINEMET IR) 25-100 MG tablet Take 1 tablet by mouth 3 (three) times daily. 07/27/20   [provider]  cholecalciferol (VITAMIN D3) 25 MCG (1000 UNIT) tablet Take 400 Units by mouth daily.     [provider]  citalopram (CELEXA) 20 MG tablet Take 20 mg by mouth daily.    [provider]  DUREZOL 0.05 % EMUL Apply 1 drop to eye 2 (two) times daily. Patient not taking: Reported on 12/26/2020 07/28/20   [provider]  erythromycin ophthalmic ointment Apply a thin strip of ointment (about 1/4") to the lower eyelid of each affected eye every 6  hours x 1 week. 01/08/21   Sharyn Creamer, MD  fenofibrate (TRICOR) 48 MG tablet Take 48 mg by mouth daily.    [provider]  fluticasone (FLONASE) 50 MCG/ACT nasal spray Place 2 sprays into both nostrils daily.    [provider]  fluticasone furoate-vilanterol (BREO ELLIPTA) 200-25 MCG/INH AEPB Inhale 1 puff into the lungs daily. 08/31/20   Marrion Coy, MD  gabapentin (NEURONTIN) 100 MG capsule Take 100 mg by mouth at bedtime.    [provider]  guaiFENesin (ROBITUSSIN) 100 MG/5ML liquid Take 5 mLs by mouth every 6 (six) hours as needed for cough or to loosen phlegm. Patient not taking: Reported on 12/28/2020    [provider]  hydroxypropyl methylcellulose / hypromellose (ISOPTO TEARS / GONIOVISC) 2.5 % ophthalmic solution Place 1 drop into the right eye 4 (four) times daily.    [provider]  ILEVRO 0.3 % ophthalmic suspension Place  1 drop into the right eye daily. 12/04/20   [provider]  Lactobacillus Acid-Pectin (ACIDOPHILUS/PECTIN) CAPS Take 1 capsule by mouth in the morning and at bedtime. 07/27/20   [provider]  levothyroxine (SYNTHROID) 150 MCG tablet Take 150 mcg by mouth daily. 07/27/20   [provider]  loperamide (IMODIUM A-D) 2 MG tablet Take 2 mg by mouth 4 (four) times daily as needed for diarrhea or loose stools.    [provider]  losartan (COZAAR) 50 MG tablet Take 50 mg by mouth daily.    [provider]  magnesium oxide (MAG-OX) 400 MG tablet Take 400 mg by mouth daily.    [provider]  metFORMIN (GLUCOPHAGE) 1000 MG tablet Take 0.5 tablets (500 mg total) by mouth 2 (two) times daily with a meal. 05/28/19   Dhungel, Nishant, MD  metoprolol tartrate (LOPRESSOR) 25 MG tablet Take 25 mg by mouth 2 (two) times daily.    [provider]  montelukast (SINGULAIR) 10 MG tablet Take 10 mg by mouth daily.    [provider]  nitroGLYCERIN (NITRODUR - DOSED IN  MG/24 HR) 0.2 mg/hr patch Place 0.2 mg onto the skin daily. leave patch on 12-14 hours, then remove for 10-12 hours prior to applying the next patch    [provider]  pantoprazole (PROTONIX) 40 MG tablet Take 40 mg by mouth daily.    [provider]  primidone (MYSOLINE) 50 MG tablet Take 50 mg by mouth 2 (two) times daily.    [provider]  primidone (MYSOLINE) 50 MG tablet Take by mouth 4 (four) times daily. Patient not taking: Reported on 12/28/2020    [provider]  senna-docusate (SENOKOT-S) 8.6-50 MG tablet Take 2 tablets by mouth 2 (two) times daily.    [provider]  sitaGLIPtin (JANUVIA) 50 MG tablet Take 50 mg by mouth daily.    [provider]  torsemide (DEMADEX) 20 MG tablet Take 1 tablet (20 mg total) by mouth daily. 08/30/20   Sharen Hones, MD  traMADol (ULTRAM) 50 MG tablet Take 1 tablet (50 mg total) by mouth every 8 (eight) hours as needed for moderate pain (left shoulder). 01/08/21   Delman Kitten, MD  traZODone (DESYREL) 50 MG tablet Take by mouth at bedtime as needed for sleep.    [provider]  vitamin B-12 (CYANOCOBALAMIN) 1000 MCG tablet Take 2,000 mcg by mouth daily.    [provider]    Allergies  Allergen Reactions   Alprazolam Swelling   Fentanyl Other (See Comments)    "burning and hot"   Meperidine Itching    Other reaction(s): Other (See Comments)   Meperidine Hcl    Ranitidine Hcl     Other reaction(s): Other (See Comments)    Family History  Problem Relation Age of Onset   Breast cancer Maternal Aunt 17   Cancer Mother        colon   Aneurysm Father     Social History Social History   Tobacco Use   Smoking status: Every Day    Packs/day: 1.00    Years: 50.00    Pack years: 50.00    Types: Cigarettes   Smokeless tobacco: Never  Vaping Use   Vaping Use: Every day  Substance Use Topics   Alcohol use: No   Drug use: No    Review of Systems Unable to obtain  adequate/accurate review of systems secondary to altered mental status  ____________________________________________   PHYSICAL  EXAM:  VITAL SIGNS: ED Triage Vitals [01/27/21 1126]  Enc Vitals Group     BP (!) 180/68     Pulse Rate 80     Resp 16     Temp 97.7 F (36.5 C)     Temp Source Oral     SpO2 99 %     Weight 145 lb 8.1 oz (66 kg)     Height      Head Circumference      Peak Flow      Pain Score      Pain Loc      Pain Edu?      Excl. in [place]?    Constitutional: Awake alert, oriented x1.  Calm cooperative and pleasant. Eyes: Normal exam ENT      Head: Normocephalic and atraumatic.      Mouth/Throat: Mucous membranes are moist. Cardiovascular: Normal rate, regular rhythm.  Respiratory: Normal respiratory effort without tachypnea nor retractions. Breath sounds are clear Gastrointestinal: Soft and nontender. No distention.   Musculoskeletal: Nontender with normal range of motion in all extremities. No lower extremity tenderness or edema. Neurologic:  Normal speech and language. No gross focal neurologic deficits  Skin:  Skin is warm, dry and intact.  Psychiatric: Mood and affect are normal.   ____________________________________________    EKG  EKG viewed and interpreted by myself shows an atrial paced rhythm at 75 bpm with a slightly widened QRS, left axis deviation, normal intervals with no concerning ST changes.  ____________________________________________   INITIAL IMPRESSION / ASSESSMENT AND PLAN / ED COURSE  Pertinent labs & imaging results that were available during my care of the patient were reviewed by me and considered in my medical decision making (see chart for details).   Patient presents to the emergency department for evaluation for 2 weeks of worsening confusion now 2 days of worsening weakness and decreased intake.  Currently patient appears well she is confused, she is oriented to person only but not why she is here where she is or the year.   Patient has no complaints however and a reassuring physical exam.  Lab work does show an elevated creatinine 2.7 compared to baseline around 1.8.  We will IV hydrate.  We will check a urine COVID swab and a CT scan of the head as a precaution.  Depending on results we will decide upon disposition.  Patient care signed out to oncoming provider.  Melanie King was evaluated in Emergency Department on 01/27/2021 for the symptoms described in the history of present illness. She was evaluated in the context of the global COVID-19 pandemic, which necessitated consideration that the patient might be at risk for infection with the SARS-CoV-2 virus that causes COVID-19. Institutional protocols and algorithms that pertain to the evaluation of patients at risk for COVID-19 are in a state of rapid change based on information released by regulatory bodies including the CDC and federal and state organizations. These policies and algorithms were followed during the patient's care in the ED.  ____________________________________________   FINAL CLINICAL IMPRESSION(S) / ED DIAGNOSES  Confusion Weakness   Harvest Dark, MD 01/27/21 1431

## 2021-02-11 ENCOUNTER — Other Ambulatory Visit: Payer: Self-pay

## 2021-02-11 ENCOUNTER — Emergency Department
Admission: EM | Admit: 2021-02-11 | Discharge: 2021-02-12 | Disposition: A | Discharge status: Home / Self Care | Payer: Medicare Other | Attending: Emergency Medicine | Admitting: Emergency Medicine

## 2021-02-11 ENCOUNTER — Emergency Department: Payer: Medicare Other

## 2021-02-11 DIAGNOSIS — Z8616 Personal history of COVID-19: Secondary | ICD-10-CM | POA: Insufficient documentation

## 2021-02-11 DIAGNOSIS — N1832 Chronic kidney disease, stage 3b: Secondary | ICD-10-CM | POA: Insufficient documentation

## 2021-02-11 DIAGNOSIS — Z7984 Long term (current) use of oral hypoglycemic drugs: Secondary | ICD-10-CM | POA: Insufficient documentation

## 2021-02-11 DIAGNOSIS — Z950 Presence of cardiac pacemaker: Secondary | ICD-10-CM | POA: Insufficient documentation

## 2021-02-11 DIAGNOSIS — F0390 Unspecified dementia without behavioral disturbance: Secondary | ICD-10-CM | POA: Diagnosis not present

## 2021-02-11 DIAGNOSIS — I5033 Acute on chronic diastolic (congestive) heart failure: Secondary | ICD-10-CM | POA: Insufficient documentation

## 2021-02-11 DIAGNOSIS — R5381 Other malaise: Secondary | ICD-10-CM

## 2021-02-11 DIAGNOSIS — F17210 Nicotine dependence, cigarettes, uncomplicated: Secondary | ICD-10-CM | POA: Insufficient documentation

## 2021-02-11 DIAGNOSIS — I130 Hypertensive heart and chronic kidney disease with heart failure and stage 1 through stage 4 chronic kidney disease, or unspecified chronic kidney disease: Secondary | ICD-10-CM | POA: Insufficient documentation

## 2021-02-11 DIAGNOSIS — Z7982 Long term (current) use of aspirin: Secondary | ICD-10-CM | POA: Insufficient documentation

## 2021-02-11 DIAGNOSIS — Z79899 Other long term (current) drug therapy: Secondary | ICD-10-CM | POA: Insufficient documentation

## 2021-02-11 DIAGNOSIS — J449 Chronic obstructive pulmonary disease, unspecified: Secondary | ICD-10-CM | POA: Insufficient documentation

## 2021-02-11 DIAGNOSIS — E1122 Type 2 diabetes mellitus with diabetic chronic kidney disease: Secondary | ICD-10-CM | POA: Insufficient documentation

## 2021-02-11 DIAGNOSIS — I2510 Atherosclerotic heart disease of native coronary artery without angina pectoris: Secondary | ICD-10-CM | POA: Insufficient documentation

## 2021-02-11 DIAGNOSIS — Z7951 Long term (current) use of inhaled steroids: Secondary | ICD-10-CM | POA: Insufficient documentation

## 2021-02-11 DIAGNOSIS — R4182 Altered mental status, unspecified: Secondary | ICD-10-CM | POA: Diagnosis present

## 2021-02-11 LAB — CBC WITH DIFFERENTIAL/PLATELET
Abs Immature Granulocytes: 0.02 10*3/uL (ref 0.00–0.07)
Basophils Absolute: 0 10*3/uL (ref 0.0–0.1)
Basophils Relative: 1 %
Eosinophils Absolute: 0 10*3/uL (ref 0.0–0.5)
Eosinophils Relative: 0 %
HCT: 37.1 % (ref 36.0–46.0)
Hemoglobin: 12.4 g/dL (ref 12.0–15.0)
Immature Granulocytes: 0 %
Lymphocytes Relative: 17 %
Lymphs Abs: 1.2 10*3/uL (ref 0.7–4.0)
MCH: 32 pg (ref 26.0–34.0)
MCHC: 33.4 g/dL (ref 30.0–36.0)
MCV: 95.9 fL (ref 80.0–100.0)
Monocytes Absolute: 0.8 10*3/uL (ref 0.1–1.0)
Monocytes Relative: 11 %
Neutro Abs: 5.3 10*3/uL (ref 1.7–7.7)
Neutrophils Relative %: 71 %
Platelets: 314 10*3/uL (ref 150–400)
RBC: 3.87 MIL/uL (ref 3.87–5.11)
RDW: 15.3 % (ref 11.5–15.5)
WBC: 7.4 10*3/uL (ref 4.0–10.5)
nRBC: 0 % (ref 0.0–0.2)

## 2021-02-11 LAB — URINALYSIS, ROUTINE W REFLEX MICROSCOPIC
Bilirubin Urine: NEGATIVE
Glucose, UA: NEGATIVE mg/dL
Hgb urine dipstick: NEGATIVE
Ketones, ur: 5 mg/dL — AB
Leukocytes,Ua: NEGATIVE
Nitrite: NEGATIVE
Protein, ur: 100 mg/dL — AB
Specific Gravity, Urine: 1.018 (ref 1.005–1.030)
pH: 5 (ref 5.0–8.0)

## 2021-02-11 LAB — COMPREHENSIVE METABOLIC PANEL
ALT: <5 U/L (ref 0–44)
AST: 15 U/L (ref 15–41)
Albumin: 4.1 g/dL (ref 3.5–5.0)
Alkaline Phosphatase: 60 U/L (ref 38–126)
Anion gap: 7 (ref 5–15)
BUN: 36 mg/dL — ABNORMAL HIGH (ref 8–23)
CO2: 26 mmol/L (ref 22–32)
Calcium: 10.2 mg/dL (ref 8.9–10.3)
Chloride: 102 mmol/L (ref 98–111)
Creatinine, Ser: 1.75 mg/dL — ABNORMAL HIGH (ref 0.44–1.00)
GFR, Estimated: 30 mL/min — ABNORMAL LOW (ref >60)
Glucose, Bld: 126 mg/dL — ABNORMAL HIGH (ref 70–99)
Potassium: 3.7 mmol/L (ref 3.5–5.1)
Sodium: 135 mmol/L (ref 135–145)
Total Bilirubin: 0.5 mg/dL (ref 0.3–1.2)
Total Protein: 7.4 g/dL (ref 6.5–8.1)

## 2021-02-11 LAB — TROPONIN I (HIGH SENSITIVITY): Troponin I (High Sensitivity): 24 ng/L — ABNORMAL HIGH (ref <18)

## 2021-02-11 MED ORDER — SODIUM CHLORIDE 0.9 % IV BOLUS: 1000.0000 mL | Freq: Once | INTRAVENOUS | Status: DC

## 2021-02-11 NOTE — Discharge Instructions (Addendum)
Your lab tests and CT scan of the head were all unremarkable.  Your clinical exam does show some mild signs of dehydration, and you should focus on increasing fluid intake over the next few days.

## 2021-02-11 NOTE — ED Notes (Signed)
Attempted to call [id number] [phone] to call report, no answer.

## 2021-02-11 NOTE — ED Provider Notes (Signed)
[facility] Emergency Department Provider Note  ____________________________________________  Time seen: Approximately 11:46 PM  I have reviewed the triage vital signs and the nursing notes.   HISTORY  Chief Complaint Altered Mental Status    Level 5 Caveat: Portions of the History and Physical including HPI and review of systems are unable to be completely obtained due to patient being a poor historian   HPI Melanie King is a 75 y.o. female with a history of CHF COPD diabetes hypertension and dementia who comes the ED from her nursing home due to worsening confusion over the past 2 weeks.  No falls or trauma.  No vomiting or fever.  No focal pain.    Past Medical History:  Diagnosis Date  . Arthritis   . CHF (congestive heart failure) (HCC)   . COPD (chronic obstructive pulmonary disease) (HCC)   . Coronary arteriosclerosis   . DDD (degenerative disc disease), cervical   . DDD (degenerative disc disease), lumbar   . Depression   . Diabetes mellitus without complication (HCC)   . GERD (gastroesophageal reflux disease)   . Hyperlipidemia   . Hypertension   . Osteoarthritis   . Pacemaker   . Tremor      Patient Active Problem List   Diagnosis Date Noted  . Malnutrition of moderate degree 08/28/2020  . Elevated troponin 08/26/2020  . CHF (congestive heart failure) (HCC) 08/26/2020  . Carotid stenosis 04/12/2020  . Dysphagia 07/19/2019  . Acute respiratory failure due to COVID-19 (HCC) 07/16/2019  . Respiratory distress   . Acute on chronic congestive heart failure (HCC)   . Acute respiratory failure with hypoxia (HCC) 07/15/2019  . Frequent falls 05/26/2019  . Generalized weakness 05/26/2019  . Acute kidney injury superimposed on CKD lllb (HCC) 03/21/2019  . Subdural hematoma 03/20/2019  . Cardiac pacemaker 03/17/2019  . Hypothyroidism 03/10/2019  . Depression   . Acute on chronic diastolic CHF (congestive heart failure) (HCC)   .  Acute metabolic encephalopathy   . Fall   . Tobacco abuse   . C6 cervical fracture (HCC)   . Left hand pain 04/01/2018  . Numbness and tingling in left hand 04/01/2018  . Left hand weakness 03/05/2018  . Confusion 05/12/2017  . Seizure (HCC) 05/10/2017  . Elevated sedimentation rate 04/04/2017  . Elevated C-reactive protein (CRP) 04/04/2017  . Chest pain, unspecified 04/02/2017  . Bilateral lower extremity pain (Secondary Area of Pain) (R>L) 04/02/2017  . Chronic hip pain, bilateral  (Secondary Area of Pain) (R>L) 04/02/2017  . Chronic bilateral low back pain with bilateral sciatica [facility] Area of Pain) (R>L) 04/02/2017  . Chronic pain syndrome 04/02/2017  . Disorder of bone, unspecified 04/02/2017  . Other long term (current) drug therapy 04/02/2017  . Other specified health status 04/02/2017  . Long term current use of opiate analgesic 04/02/2017  . Hip pain, bilateral 12/06/2016  . Tremor 05/27/2016  . Trigger finger of left hand 02/16/2016  . Osteoarthritis of both hands 02/16/2016  . Angina pectoris (HCC) 01/17/2016  . COPD with acute exacerbation (HCC) 01/17/2016  . CAD (coronary artery disease) 01/17/2016  . HLD (hyperlipidemia) 01/17/2016  . Hypertension 01/17/2016  . Sick sinus syndrome (HCC) 01/17/2016  . Chronic pain 01/17/2016  . Personal history of tobacco use, presenting hazards to health 11/16/2015  . Type 2 diabetes mellitus without complication, with long-term current use of insulin (HCC) 05/12/2013     Past Surgical History:  Procedure Laterality Date  . ABDOMINAL HYSTERECTOMY    .  BREAST BIOPSY Right 1994   neg cyst removed  . CATARACT EXTRACTION W/PHACO Right 08/17/2020   Procedure: CATARACT EXTRACTION PHACO AND INTRAOCULAR LENS PLACEMENT (IOC) RIGHT DIABETIC 8.90 00:52.9;  Surgeon: Galen Manila, MD;  Location: [facility];  Service: Ophthalmology;  Laterality: Right;  Diabetic  . CHOLECYSTECTOMY    . EYE SURGERY  1964, 1966, and 1967    bilateral  . FOOT SURGERY Right    cellulitis  . PACEMAKER INSERTION    . PPM GENERATOR CHANGEOUT N/A 02/24/2020   Procedure: PPM GENERATOR CHANGEOUT;  Surgeon: Marcina Millard, MD;  Location: [facility];  Service: Cardiovascular;  Laterality: N/A;  . SPINE SURGERY     cyst removed; [facility]     Prior to Admission medications   Medication Sig Start Date End Date Taking? Authorizing Provider  acetaminophen (TYLENOL) 500 MG tablet Take 500 mg by mouth every 6 (six) hours as needed for mild pain or moderate pain.    [provider]  amLODipine (NORVASC) 10 MG tablet TAKE 1 TABLET BY MOUTH EVERY DAY Patient not taking: Reported on 12/28/2020 08/28/16   Gabriel Cirri, NP  amLODipine (NORVASC) 5 MG tablet Take 5 mg by mouth daily.    [provider]  ASPIRIN LOW DOSE 81 MG chewable tablet Chew 81 mg by mouth daily. 07/27/20   [provider]  benztropine (COGENTIN) 0.5 MG tablet Take 0.5 mg by mouth 2 (two) times daily.    [provider]  brexpiprazole (REXULTI) 2 MG TABS tablet Take 1 mg by mouth at bedtime.    [provider]  calcitRIOL (ROCALTROL) 0.25 MCG capsule Take 0.25 mcg by mouth daily.    [provider]  carbidopa-levodopa (SINEMET IR) 25-100 MG tablet Take 1 tablet by mouth 3 (three) times daily. 07/27/20   [provider]  cholecalciferol (VITAMIN D3) 25 MCG (1000 UNIT) tablet Take 400 Units by mouth daily.     [provider]  citalopram (CELEXA) 20 MG tablet Take 20 mg by mouth daily.    [provider]  DUREZOL 0.05 % EMUL Apply 1 drop to eye 2 (two) times daily. Patient not taking: Reported on 12/26/2020 07/28/20   [provider]  erythromycin ophthalmic ointment Apply a thin strip of ointment (about 1/4") to the lower eyelid of each affected eye every 6 hours x 1 week. 01/08/21   Sharyn Creamer, MD  fenofibrate (TRICOR) 48 MG tablet Take 48 mg by mouth daily.    [provider]  fluticasone (FLONASE) 50 MCG/ACT nasal spray Place 2 sprays into both nostrils daily.    [provider]  fluticasone furoate-vilanterol (BREO ELLIPTA) 200-25 MCG/INH AEPB Inhale 1 puff into the lungs daily. 08/31/20   Marrion Coy, MD  gabapentin (NEURONTIN) 100 MG capsule Take 100 mg by mouth at bedtime.    [provider]  guaiFENesin (ROBITUSSIN) 100 MG/5ML liquid Take 5 mLs by mouth every 6 (six) hours as needed for cough or to loosen phlegm. Patient not taking: Reported on 12/28/2020    [provider]  hydroxypropyl methylcellulose / hypromellose (ISOPTO TEARS / GONIOVISC) 2.5 % ophthalmic solution Place 1 drop into the right eye 4 (four) times daily.    [provider]  ILEVRO 0.3 % ophthalmic suspension Place 1 drop into the right eye daily. 12/04/20   [provider]  Lactobacillus Acid-Pectin (ACIDOPHILUS/PECTIN) CAPS Take 1 capsule by mouth in the morning and at bedtime. 07/27/20   [provider]  levothyroxine (SYNTHROID) 150 MCG tablet Take 150 mcg by mouth daily. 07/27/20   [provider]  loperamide (IMODIUM A-D) 2 MG tablet Take 2 mg by mouth 4 (four) times daily as needed for diarrhea or loose stools.    [provider]  losartan (COZAAR) 50 MG tablet Take 50 mg by mouth daily.    [provider]  magnesium oxide (MAG-OX) 400 MG tablet Take 400 mg by mouth daily.    [provider]  metFORMIN (GLUCOPHAGE) 1000 MG tablet Take 0.5 tablets (500 mg total) by mouth 2 (two) times daily with a meal. 05/28/19   Dhungel, Nishant, MD  metoprolol tartrate (LOPRESSOR) 25 MG tablet Take 25 mg by mouth 2 (two) times daily.    [provider]  montelukast (SINGULAIR) 10 MG tablet Take 10 mg by mouth daily.    [provider]  nitroGLYCERIN (NITRODUR - DOSED IN MG/24 HR) 0.2 mg/hr patch Place 0.2 mg onto the skin daily. leave patch on 12-14 hours, then remove for 10-12 hours  prior to applying the next patch    [provider]  pantoprazole (PROTONIX) 40 MG tablet Take 40 mg by mouth daily.    [provider]  primidone (MYSOLINE) 50 MG tablet Take 50 mg by mouth 2 (two) times daily.    [provider]  primidone (MYSOLINE) 50 MG tablet Take by mouth 4 (four) times daily. Patient not taking: Reported on 12/28/2020    [provider]  senna-docusate (SENOKOT-S) 8.6-50 MG tablet Take 2 tablets by mouth 2 (two) times daily.    [provider]  sitaGLIPtin (JANUVIA) 50 MG tablet Take 50 mg by mouth daily.    [provider]  torsemide (DEMADEX) 20 MG tablet Take 1 tablet (20 mg total) by mouth daily. 08/30/20   Sharen Hones, MD  traMADol (ULTRAM) 50 MG tablet Take 1 tablet (50 mg total) by mouth every 8 (eight) hours as needed for moderate pain (left shoulder). 01/08/21   Delman Kitten, MD  traZODone (DESYREL) 50 MG tablet Take by mouth at bedtime as needed for sleep.    [provider]  vitamin B-12 (CYANOCOBALAMIN) 1000 MCG tablet Take 2,000 mcg by mouth daily.    [provider]     Allergies Alprazolam, Fentanyl, Meperidine, Meperidine hcl, and Ranitidine hcl   Family History  Problem Relation Age of Onset  . Breast cancer Maternal Aunt 26  . Cancer Mother        colon  . Aneurysm Father     Social History Social History   Tobacco Use  . Smoking status: Every Day    Packs/day: 1.00    Years: 50.00    Pack years: 50.00    Types: Cigarettes  . Smokeless tobacco: Never  Vaping Use  . Vaping Use: Every day  Substance Use Topics  . Alcohol use: No  . Drug use: No    Review of Systems Level 5 Caveat: Portions of the History and Physical including HPI and review of systems are unable to be completely obtained due to patient being a poor historian   Constitutional:   No known fever.  ENT:   No rhinorrhea. Cardiovascular:   No chest pain or syncope. Respiratory:   No dyspnea or  cough. Gastrointestinal:   Negative for abdominal pain, vomiting and diarrhea.  Musculoskeletal:   Negative for focal pain or swelling ____________________________________________   PHYSICAL EXAM:  VITAL SIGNS: ED Triage Vitals  Enc Vitals Group  BP 02/11/21 2100 (!) 174/84     Pulse Rate 02/11/21 2100 69     Resp 02/11/21 2100 20     Temp 02/11/21 2100 98.3 F (36.8 C)     Temp Source 02/11/21 2100 Oral     SpO2 02/11/21 2100 97 %     Weight 02/11/21 2102 130 lb (59 kg)     Height --      Head Circumference --      Peak Flow --      Pain Score --      Pain Loc --      Pain Edu? --      Excl. in [place]? --     Vital signs reviewed, nursing assessments reviewed.   Constitutional:   Alert and oriented to person and place. Non-toxic appearance. Eyes:   Conjunctivae are normal. EOMI. PERRL. ENT      Head:   Normocephalic and atraumatic.      Nose:   No congestion/rhinnorhea.       Mouth/Throat:   Somewhat dry mucous membranes, no pharyngeal erythema. No peritonsillar mass.       Neck:   No meningismus. Full ROM. Hematological/Lymphatic/Immunilogical:   No cervical lymphadenopathy. Cardiovascular:   RRR. Symmetric bilateral radial and DP pulses.  No murmurs. Cap refill less than 2 seconds. Respiratory:   Normal respiratory effort without tachypnea/retractions. Breath sounds are clear and equal bilaterally. No wheezes/rales/rhonchi. Gastrointestinal:   Soft with mild suprapubic tenderness. Non distended. There is no CVA tenderness.  No rebound, rigidity, or guarding.  Musculoskeletal:   Normal range of motion in all extremities. No joint effusions.  No lower extremity tenderness.  No edema. Neurologic:   Normal speech and language.  Motor grossly intact. No acute focal neurologic deficits are appreciated.  Skin:    Skin is warm, dry and intact. No rash noted.  No petechiae, purpura, or bullae.  ____________________________________________    LABS (pertinent  positives/negatives) (all labs ordered are listed, but only abnormal results are displayed) Labs Reviewed  COMPREHENSIVE METABOLIC PANEL - Abnormal; Notable for the following components:      Result Value   Glucose, Bld 126 (*)    BUN 36 (*)    Creatinine, Ser 1.75 (*)    GFR, Estimated 30 (*)    All other components within normal limits  URINALYSIS, ROUTINE W REFLEX MICROSCOPIC - Abnormal; Notable for the following components:   Color, Urine YELLOW (*)    APPearance HAZY (*)    Ketones, ur 5 (*)    Protein, ur 100 (*)    Bacteria, UA RARE (*)    All other components within normal limits  TROPONIN I (HIGH SENSITIVITY) - Abnormal; Notable for the following components:   Troponin I (High Sensitivity) 24 (*)    All other components within normal limits  RESP PANEL BY RT-PCR (FLU A&B, COVID) ARPGX2  CBC WITH DIFFERENTIAL/PLATELET  TROPONIN I (HIGH SENSITIVITY)   ____________________________________________   EKG  Interpreted by me Atrial paced rhythm, rate of 70.  Normal axis.  Right bundle branch block.  Normal ST segments and T waves.  Stable for discharge  ____________________________________________    RADIOLOGY  CT Head Wo Contrast  Result Date: 02/11/2021 CLINICAL DATA:  Altered mental status. EXAM: CT HEAD WITHOUT CONTRAST TECHNIQUE: Contiguous axial images were obtained from the base of the skull through the vertex without intravenous contrast. COMPARISON:  Head CT dated 01/27/2021. FINDINGS: Brain: Mild age-related atrophy and chronic microvascular ischemic changes. Similar mild  dilatation of the lateral ventricles which may represent central volume loss versus normal pressure hydrocephalus. Clinical correlation recommended. There is no acute intracranial hemorrhage. No mass effect midline shift. No extra-axial fluid collection. Vascular: No hyperdense vessel or unexpected calcification. Skull: Normal. Negative for fracture or focal lesion. Sinuses/Orbits: No acute  finding. Other: None IMPRESSION: 1. No acute intracranial pathology. 2. Mild age-related atrophy and chronic microvascular ischemic changes. 3. Similar mild dilatation of the lateral ventricles which may represent central volume loss versus normal pressure hydrocephalus. Electronically Signed   By: Anner Crete M.D.   On: 02/11/2021 22:14    ____________________________________________   PROCEDURES Procedures  ____________________________________________  DIFFERENTIAL DIAGNOSIS   Dehydration, electrolyte abnormality, UTI, intracranial hemorrhage, anemia  CLINICAL IMPRESSION / ASSESSMENT AND PLAN / ED COURSE  Medications ordered in the ED: Medications  sodium chloride 0.9 % bolus 1,000 mL (has no administration in time range)    Pertinent labs & imaging results that were available during my care of the patient were reviewed by me and considered in my medical decision making (see chart for details).   Melanie King was evaluated in Emergency Department on 02/11/2021 for the symptoms described in the history of present illness. She was evaluated in the context of the global COVID-19 pandemic, which necessitated consideration that the patient might be at risk for infection with the SARS-CoV-2 virus that causes COVID-19. Institutional protocols and algorithms that pertain to the evaluation of patients at risk for COVID-19 are in a state of rapid change based on information released by regulatory bodies including the CDC and federal and state organizations. These policies and algorithms were followed during the patient's care in the ED.   Patient presents with confusion in the setting of chronic dementia and multiple chronic comorbidities under SNF care.  EKG, labs, CT head all unremarkable.  She is hypertensive but otherwise with unremarkable vitals and reassuring exam.  Appears somewhat dehydrated which may be the cause of her change in status.  Recommend increase fluid intake and  follow-up with PCP.  Stable for discharge.  Clinical Course as of 02/11/21 2352  Fri Feb 11, 2021  2351 Chart reviewed, notably she was evaluated a month ago in cardiology clinic for similar symptoms which has been going on for at least 2 weeks at that time.  She appears to be at her chronic baseline and stable for discharge back to her SNF. [PS]    Clinical Course User Index [PS] Carrie Mew, MD     ____________________________________________   FINAL CLINICAL IMPRESSION(S) / ED DIAGNOSES    Final diagnoses:  Chronic dementia ([place])  [place]     ED Discharge Orders     None       Portions of this note were generated with dragon dictation software. Dictation errors may occur despite best attempts at proofreading.   Carrie Mew, MD 02/11/21 [phone]

## 2021-02-11 NOTE — ED Triage Notes (Signed)
Pt presents to ER via ems from the [facility] at [facility] for ams that has been worsening for last 2 weeks.  Pt is unable to tell this RN why she is here tonight.  Pt A&O x2 at this time.  Unable to state month/year or why she is here.  Pt has recent dx of dementia per her paperwork.

## 2021-02-11 NOTE — ED Notes (Signed)
First Nurse note: Pt from [facility] by EMS c/o AMS that has been worsening, hx of dementia per EMS.  Pt had a fall 1 month ago that seems to have progressed confusion, facility concerned for UTI.  Per EMS patient has been trying to escape from the facility and has had increased agitation.

## 2021-02-12 NOTE — ED Notes (Signed)
Attempted to call the [facility] to give report, no answer.

## 2021-02-13 ENCOUNTER — Other Ambulatory Visit: Payer: Self-pay

## 2021-02-13 ENCOUNTER — Emergency Department
Admission: EM | Admit: 2021-02-13 | Discharge: 2021-02-13 | Disposition: A | Discharge status: Home / Self Care | Payer: Medicare Other | Attending: Emergency Medicine | Admitting: Emergency Medicine

## 2021-02-13 ENCOUNTER — Emergency Department: Payer: Medicare Other

## 2021-02-13 DIAGNOSIS — I130 Hypertensive heart and chronic kidney disease with heart failure and stage 1 through stage 4 chronic kidney disease, or unspecified chronic kidney disease: Secondary | ICD-10-CM | POA: Insufficient documentation

## 2021-02-13 DIAGNOSIS — I5033 Acute on chronic diastolic (congestive) heart failure: Secondary | ICD-10-CM | POA: Diagnosis not present

## 2021-02-13 DIAGNOSIS — I2510 Atherosclerotic heart disease of native coronary artery without angina pectoris: Secondary | ICD-10-CM | POA: Diagnosis not present

## 2021-02-13 DIAGNOSIS — Z7982 Long term (current) use of aspirin: Secondary | ICD-10-CM | POA: Insufficient documentation

## 2021-02-13 DIAGNOSIS — Z950 Presence of cardiac pacemaker: Secondary | ICD-10-CM | POA: Insufficient documentation

## 2021-02-13 DIAGNOSIS — J441 Chronic obstructive pulmonary disease with (acute) exacerbation: Secondary | ICD-10-CM | POA: Insufficient documentation

## 2021-02-13 DIAGNOSIS — W19XXXA Unspecified fall, initial encounter: Secondary | ICD-10-CM

## 2021-02-13 DIAGNOSIS — N1832 Chronic kidney disease, stage 3b: Secondary | ICD-10-CM | POA: Insufficient documentation

## 2021-02-13 DIAGNOSIS — S0101XA Laceration without foreign body of scalp, initial encounter: Secondary | ICD-10-CM | POA: Diagnosis not present

## 2021-02-13 DIAGNOSIS — Z794 Long term (current) use of insulin: Secondary | ICD-10-CM | POA: Diagnosis not present

## 2021-02-13 DIAGNOSIS — E119 Type 2 diabetes mellitus without complications: Secondary | ICD-10-CM | POA: Insufficient documentation

## 2021-02-13 DIAGNOSIS — F17210 Nicotine dependence, cigarettes, uncomplicated: Secondary | ICD-10-CM | POA: Insufficient documentation

## 2021-02-13 DIAGNOSIS — Y92129 Unspecified place in nursing home as the place of occurrence of the external cause: Secondary | ICD-10-CM | POA: Insufficient documentation

## 2021-02-13 DIAGNOSIS — Z79899 Other long term (current) drug therapy: Secondary | ICD-10-CM | POA: Insufficient documentation

## 2021-02-13 DIAGNOSIS — Z8616 Personal history of COVID-19: Secondary | ICD-10-CM | POA: Insufficient documentation

## 2021-02-13 DIAGNOSIS — Z7984 Long term (current) use of oral hypoglycemic drugs: Secondary | ICD-10-CM | POA: Insufficient documentation

## 2021-02-13 DIAGNOSIS — Z20822 Contact with and (suspected) exposure to covid-19: Secondary | ICD-10-CM | POA: Insufficient documentation

## 2021-02-13 DIAGNOSIS — S0990XA Unspecified injury of head, initial encounter: Secondary | ICD-10-CM | POA: Diagnosis present

## 2021-02-13 DIAGNOSIS — E1122 Type 2 diabetes mellitus with diabetic chronic kidney disease: Secondary | ICD-10-CM | POA: Diagnosis not present

## 2021-02-13 LAB — CBC WITH DIFFERENTIAL/PLATELET
Abs Immature Granulocytes: 0.02 10*3/uL (ref 0.00–0.07)
Basophils Absolute: 0 10*3/uL (ref 0.0–0.1)
Basophils Relative: 0 %
Eosinophils Absolute: 0 10*3/uL (ref 0.0–0.5)
Eosinophils Relative: 0 %
HCT: 36.4 % (ref 36.0–46.0)
Hemoglobin: 12.2 g/dL (ref 12.0–15.0)
Immature Granulocytes: 0 %
Lymphocytes Relative: 12 %
Lymphs Abs: 1.1 10*3/uL (ref 0.7–4.0)
MCH: 31.4 pg (ref 26.0–34.0)
MCHC: 33.5 g/dL (ref 30.0–36.0)
MCV: 93.8 fL (ref 80.0–100.0)
Monocytes Absolute: 1 10*3/uL (ref 0.1–1.0)
Monocytes Relative: 11 %
Neutro Abs: 6.7 10*3/uL (ref 1.7–7.7)
Neutrophils Relative %: 77 %
Platelets: 332 10*3/uL (ref 150–400)
RBC: 3.88 MIL/uL (ref 3.87–5.11)
RDW: 15.3 % (ref 11.5–15.5)
Smear Review: NORMAL
WBC: 8.7 10*3/uL (ref 4.0–10.5)
nRBC: 0 % (ref 0.0–0.2)

## 2021-02-13 LAB — BASIC METABOLIC PANEL
Anion gap: 9 (ref 5–15)
BUN: 38 mg/dL — ABNORMAL HIGH (ref 8–23)
CO2: 25 mmol/L (ref 22–32)
Calcium: 9.6 mg/dL (ref 8.9–10.3)
Chloride: 100 mmol/L (ref 98–111)
Creatinine, Ser: 1.99 mg/dL — ABNORMAL HIGH (ref 0.44–1.00)
GFR, Estimated: 26 mL/min — ABNORMAL LOW (ref >60)
Glucose, Bld: 184 mg/dL — ABNORMAL HIGH (ref 70–99)
Potassium: 4.2 mmol/L (ref 3.5–5.1)
Sodium: 134 mmol/L — ABNORMAL LOW (ref 135–145)

## 2021-02-13 LAB — PROTIME-INR
INR: 1.1 (ref 0.8–1.2)
Prothrombin Time: 14.2 seconds (ref 11.4–15.2)

## 2021-02-13 LAB — RESP PANEL BY RT-PCR (FLU A&B, COVID) ARPGX2
Influenza A by PCR: NEGATIVE
Influenza B by PCR: NEGATIVE
SARS Coronavirus 2 by RT PCR: NEGATIVE

## 2021-02-13 MED ORDER — MORPHINE SULFATE (PF) 4 MG/ML IV SOLN: 4.0000 mg | Freq: Once | INTRAVENOUS | Status: DC

## 2021-02-13 NOTE — ED Triage Notes (Signed)
Pt comes via EMS from [facility] [address] with c/o unwitnessed fall. EMS reports pt is lethargic. Facility reports pt takes blood thinners.  20 g left 148/80 HR-100 CBG-198 95% RA  Pt has laceration noted to back of head. Bleeding controlled per EMS

## 2021-02-13 NOTE — ED Notes (Signed)
[facility] to transport to [facility] [address]

## 2021-02-13 NOTE — ED Provider Notes (Signed)
  Emergency Medicine Provider Triage Evaluation Note  Melanie King , a 75 y.o.female,  was evaluated in triage.  Pt complains of injuries from fall.  Per EMS, the patient suffered from an unwitnessed fall.  She is growingly more lethargic.  Patient is awake and alert but is unable to communicate coherently.  Facility reports patient is on blood thinners.  There is a laceration to the posterior aspect of her head.  Bleeding controlled with direct pressure.    Review of Systems  Positive: Headache, AMS Negative: Denies fever, chest pain, vomiting  Physical Exam  There were no vitals filed for this visit. Gen:   Awake, uncomfortable Resp:  Normal effort  MSK:   Moves extremities without difficulty  Other:  Patient endorses significant pain with movement of lower extremities, particularly with rotation.  In addition, she endorses tenderness when palpating her chest wall there was unable to localize where  Medical Decision Making  Given the patient's initial medical screening exam, the following diagnostic evaluation has been ordered. The patient will be placed in the appropriate treatment space, once one is available, to complete the evaluation and treatment. I have discussed the plan of care with the patient and I have advised the patient that an ED physician or mid-level practitioner will reevaluate their condition after the test results have been received, as the results may give them additional insight into the type of treatment they may need.    Diagnostics: Head CT, cervical spine CT, CXR, hip x-rays, labs, respiratory panel.  Treatments: none immediately   Varney Daily, Samadhi 02/13/21 1553    Merwyn Katos, MD 02/13/21 [phone]

## 2021-02-13 NOTE — ED Provider Notes (Signed)
[facility] Emergency Department Provider Note   ____________________________________________    I have reviewed the triage vital signs and the nursing notes.   HISTORY  Chief Complaint Fall   History of dementia  HPI Melanie King is a 75 y.o. female who presents after a fall, patient has a history of frequent falls.  This was unwitnessed at facility.  It was reported that she is on blood thinners but I see no record of blood thinners on her MAR.  Patient appears to be at her baseline  Past Medical History:  Diagnosis Date   Arthritis    CHF (congestive heart failure) (HCC)    COPD (chronic obstructive pulmonary disease) (HCC)    Coronary arteriosclerosis    DDD (degenerative disc disease), cervical    DDD (degenerative disc disease), lumbar    Depression    Diabetes mellitus without complication (HCC)    GERD (gastroesophageal reflux disease)    Hyperlipidemia    Hypertension    Osteoarthritis    Pacemaker    Tremor     Patient Active Problem List   Diagnosis Date Noted   Malnutrition of moderate degree 08/28/2020   Elevated troponin 08/26/2020   CHF (congestive heart failure) (HCC) 08/26/2020   Carotid stenosis 04/12/2020   Dysphagia 07/19/2019   Acute respiratory failure due to COVID-19 (HCC) 07/16/2019   Respiratory distress    Acute on chronic congestive heart failure (HCC)    Acute respiratory failure with hypoxia (HCC) 07/15/2019   Frequent falls 05/26/2019   Generalized weakness 05/26/2019   Acute kidney injury superimposed on CKD lllb (HCC) 03/21/2019   Subdural hematoma 03/20/2019   Cardiac pacemaker 03/17/2019   Hypothyroidism 03/10/2019   Depression    Acute on chronic diastolic CHF (congestive heart failure) (HCC)    Acute metabolic encephalopathy    Fall    Tobacco abuse    C6 cervical fracture (HCC)    Left hand pain 04/01/2018   Numbness and tingling in left hand 04/01/2018   Left hand weakness 03/05/2018    Confusion 05/12/2017   Seizure (HCC) 05/10/2017   Elevated sedimentation rate 04/04/2017   Elevated C-reactive protein (CRP) 04/04/2017   Chest pain, unspecified 04/02/2017   Bilateral lower extremity pain (Secondary Area of Pain) (R>L) 04/02/2017   Chronic hip pain, bilateral  (Secondary Area of Pain) (R>L) 04/02/2017   Chronic bilateral low back pain with bilateral sciatica [facility] Area of Pain) (R>L) 04/02/2017   Chronic pain syndrome 04/02/2017   Disorder of bone, unspecified 04/02/2017   Other long term (current) drug therapy 04/02/2017   Other specified health status 04/02/2017   Long term current use of opiate analgesic 04/02/2017   Hip pain, bilateral 12/06/2016   Tremor 05/27/2016   Trigger finger of left hand 02/16/2016   Osteoarthritis of both hands 02/16/2016   Angina pectoris (HCC) 01/17/2016   COPD with acute exacerbation (HCC) 01/17/2016   CAD (coronary artery disease) 01/17/2016   HLD (hyperlipidemia) 01/17/2016   Hypertension 01/17/2016   Sick sinus syndrome (HCC) 01/17/2016   Chronic pain 01/17/2016   Personal history of tobacco use, presenting hazards to health 11/16/2015   Type 2 diabetes mellitus without complication, with long-term current use of insulin (HCC) 05/12/2013    Past Surgical History:  Procedure Laterality Date   ABDOMINAL HYSTERECTOMY     BREAST BIOPSY Right 1994   neg cyst removed   CATARACT EXTRACTION W/PHACO Right 08/17/2020   Procedure: CATARACT EXTRACTION PHACO AND INTRAOCULAR  LENS PLACEMENT (IOC) RIGHT DIABETIC 8.90 00:52.9;  Surgeon: Birder Robson, MD;  Location: [place];  Service: Ophthalmology;  Laterality: Right;  Diabetic   CHOLECYSTECTOMY     EYE SURGERY  1964, 1966, and 1967   bilateral   FOOT SURGERY Right    cellulitis   PACEMAKER INSERTION     PPM GENERATOR CHANGEOUT N/A 02/24/2020   Procedure: PPM GENERATOR CHANGEOUT;  Surgeon: Isaias Cowman, MD;  Location: [facility];  Service:  Cardiovascular;  Laterality: N/A;   SPINE SURGERY     cyst removed; [facility]    Prior to Admission medications   Medication Sig Start Date End Date Taking? Authorizing Provider  acetaminophen (TYLENOL) 500 MG tablet Take 500 mg by mouth every 6 (six) hours as needed for mild pain or moderate pain.    [provider]  amLODipine (NORVASC) 10 MG tablet TAKE 1 TABLET BY MOUTH EVERY DAY Patient not taking: Reported on 12/28/2020 08/28/16   Kathrine Haddock, NP  amLODipine (NORVASC) 5 MG tablet Take 5 mg by mouth daily.    [provider]  ASPIRIN LOW DOSE 81 MG chewable tablet Chew 81 mg by mouth daily. 07/27/20   [provider]  benztropine (COGENTIN) 0.5 MG tablet Take 0.5 mg by mouth 2 (two) times daily.    [provider]  brexpiprazole (REXULTI) 2 MG TABS tablet Take 1 mg by mouth at bedtime.    [provider]  calcitRIOL (ROCALTROL) 0.25 MCG capsule Take 0.25 mcg by mouth daily.    [provider]  carbidopa-levodopa (SINEMET IR) 25-100 MG tablet Take 1 tablet by mouth 3 (three) times daily. 07/27/20   [provider]  cholecalciferol (VITAMIN D3) 25 MCG (1000 UNIT) tablet Take 400 Units by mouth daily.     [provider]  citalopram (CELEXA) 20 MG tablet Take 20 mg by mouth daily.    [provider]  DUREZOL 0.05 % EMUL Apply 1 drop to eye 2 (two) times daily. Patient not taking: Reported on 12/26/2020 07/28/20   [provider]  erythromycin ophthalmic ointment Apply a thin strip of ointment (about 1/4") to the lower eyelid of each affected eye every 6 hours x 1 week. 01/08/21   Delman Kitten, MD  fenofibrate (TRICOR) 48 MG tablet Take 48 mg by mouth daily.    [provider]  fluticasone (FLONASE) 50 MCG/ACT nasal spray Place 2 sprays into both nostrils daily.    [provider]  fluticasone furoate-vilanterol (BREO ELLIPTA) 200-25 MCG/INH AEPB Inhale 1 puff into the lungs daily.  08/31/20   Sharen Hones, MD  gabapentin (NEURONTIN) 100 MG capsule Take 100 mg by mouth at bedtime.    [provider]  guaiFENesin (ROBITUSSIN) 100 MG/5ML liquid Take 5 mLs by mouth every 6 (six) hours as needed for cough or to loosen phlegm. Patient not taking: Reported on 12/28/2020    [provider]  hydroxypropyl methylcellulose / hypromellose (ISOPTO TEARS / GONIOVISC) 2.5 % ophthalmic solution Place 1 drop into the right eye 4 (four) times daily.    [provider]  ILEVRO 0.3 % ophthalmic suspension Place 1 drop into the right eye daily. 12/04/20   [provider]  Lactobacillus Acid-Pectin (ACIDOPHILUS/PECTIN) CAPS Take 1 capsule by mouth in the morning and at bedtime. 07/27/20   [provider]  levothyroxine (SYNTHROID) 150 MCG tablet Take 150 mcg by mouth daily. 07/27/20   [provider]  loperamide (IMODIUM A-D) 2 MG tablet  Take 2 mg by mouth 4 (four) times daily as needed for diarrhea or loose stools.    [provider]  losartan (COZAAR) 50 MG tablet Take 50 mg by mouth daily.    [provider]  magnesium oxide (MAG-OX) 400 MG tablet Take 400 mg by mouth daily.    [provider]  metFORMIN (GLUCOPHAGE) 1000 MG tablet Take 0.5 tablets (500 mg total) by mouth 2 (two) times daily with a meal. 05/28/19   Dhungel, Nishant, MD  metoprolol tartrate (LOPRESSOR) 25 MG tablet Take 25 mg by mouth 2 (two) times daily.    [provider]  montelukast (SINGULAIR) 10 MG tablet Take 10 mg by mouth daily.    [provider]  nitroGLYCERIN (NITRODUR - DOSED IN MG/24 HR) 0.2 mg/hr patch Place 0.2 mg onto the skin daily. leave patch on 12-14 hours, then remove for 10-12 hours prior to applying the next patch    [provider]  pantoprazole (PROTONIX) 40 MG tablet Take 40 mg by mouth daily.    [provider]  primidone (MYSOLINE) 50 MG tablet Take 50 mg by mouth 2 (two) times daily.     [provider]  primidone (MYSOLINE) 50 MG tablet Take by mouth 4 (four) times daily. Patient not taking: Reported on 12/28/2020    [provider]  senna-docusate (SENOKOT-S) 8.6-50 MG tablet Take 2 tablets by mouth 2 (two) times daily.    [provider]  sitaGLIPtin (JANUVIA) 50 MG tablet Take 50 mg by mouth daily.    [provider]  torsemide (DEMADEX) 20 MG tablet Take 1 tablet (20 mg total) by mouth daily. 08/30/20   Sharen Hones, MD  traMADol (ULTRAM) 50 MG tablet Take 1 tablet (50 mg total) by mouth every 8 (eight) hours as needed for moderate pain (left shoulder). 01/08/21   Delman Kitten, MD  traZODone (DESYREL) 50 MG tablet Take by mouth at bedtime as needed for sleep.    [provider]  vitamin B-12 (CYANOCOBALAMIN) 1000 MCG tablet Take 2,000 mcg by mouth daily.    [provider]     Allergies Alprazolam, Fentanyl, Meperidine, Meperidine hcl, and Ranitidine hcl  Family History  Problem Relation Age of Onset   Breast cancer Maternal Aunt 35   Cancer Mother        colon   Aneurysm Father     Social History Social History   Tobacco Use   Smoking status: Every Day    Packs/day: 1.00    Years: 50.00    Pack years: 50.00    Types: Cigarettes   Smokeless tobacco: Never  Vaping Use   Vaping Use: Every day  Substance Use Topics   Alcohol use: No   Drug use: No    Unable to obtain review of Systems due to altered mental status     ____________________________________________   PHYSICAL EXAM:  VITAL SIGNS: ED Triage Vitals [02/13/21 1547]  Enc Vitals Group     BP      Pulse      Resp      Temp      Temp src      SpO2      Weight      Height      Head Circumference      Peak Flow      Pain Score 0     Pain Loc      Pain Edu?      Excl. in [place]?  Constitutional: Alert, disoriented, no acute distress Eyes: Conjunctivae are normal.  Head: Small less than 1 cm shallow laceration posterior  scalp Nose: No epistaxis or swelling Mouth/Throat: Mucous membranes are moist.   Neck:  Painless ROM, no vertebral has palpation, no pain with axial load Cardiovascular: Normal rate, regular rhythm. Grossly normal heart sounds.  Good peripheral circulation. Respiratory: Normal respiratory effort.  No retractions. Gastrointestinal: Soft and nontender. No distention.    Musculoskeletal: Moves all extremities without pain, no pain with axial load on both hips.  No vertebral terms palpation Neurologic:  Normal speech and language. No gross focal neurologic deficits are appreciated.  Skin:  Skin is warm, dry    ____________________________________________   LABS (all labs ordered are listed, but only abnormal results are displayed)  Labs Reviewed  BASIC METABOLIC PANEL - Abnormal; Notable for the following components:      Result Value   Sodium 134 (*)    Glucose, Bld 184 (*)    BUN 38 (*)    Creatinine, Ser 1.99 (*)    GFR, Estimated 26 (*)    All other components within normal limits  RESP PANEL BY RT-PCR (FLU A&B, COVID) [id number]  CBC WITH DIFFERENTIAL/PLATELET  PROTIME-INR   ____________________________________________  EKG  ED ECG REPORT I, Lavonia Drafts, the attending physician, personally viewed and interpreted this ECG.  Date: 02/13/2021  Rhythm: Atrial paced rhythm QRS Axis: normal Intervals: Right bundle branch block ST/T Wave abnormalities: normal Narrative Interpretation: no evidence of acute ischemia  ____________________________________________  RADIOLOGY  Chest x-ray, reviewed by me, no acute abnormalities Hip x-ray without evidence of fracture CT head and cervical spine are reassuring ____________________________________________   PROCEDURES  Procedure(s) performed: No  Procedures   Critical Care performed: No ____________________________________________   INITIAL IMPRESSION / ASSESSMENT AND PLAN / ED COURSE  Pertinent labs & imaging  results that were available during my care of the patient were reviewed by me and considered in my medical decision making (see chart for details).   Patient presents after unwitnessed fall, she appears to be at her baseline  Patient's blood pressure obtained by me 125/76, heart rate is 88, temperature 98.2, normal respiratory rate  Very shallow small laceration to the scalp, no need for repair  Imaging and lab work is quite reassuring, patient's creatinine is chronically elevated  No indication for further work-up at this time, appropriate for discharge back to facility    ____________________________________________   FINAL CLINICAL IMPRESSION(S) / ED DIAGNOSES  Final diagnoses:  Laceration of scalp, initial encounter  Injury of head, initial encounter        Note:  This document was prepared using Dragon voice recognition software and may include unintentional dictation errors.    Lavonia Drafts, MD 02/13/21 2010

## 2021-02-13 NOTE — ED Notes (Signed)
Called EMS for transport back to [place] [facility]

## 2021-02-16 ENCOUNTER — Other Ambulatory Visit: Payer: Self-pay

## 2021-02-16 ENCOUNTER — Emergency Department: Payer: Medicare Other

## 2021-02-16 ENCOUNTER — Emergency Department
Admission: EM | Admit: 2021-02-16 | Discharge: 2021-02-16 | Disposition: A | Discharge status: Home / Self Care | Payer: Medicare Other | Attending: Emergency Medicine | Admitting: Emergency Medicine

## 2021-02-16 DIAGNOSIS — F17210 Nicotine dependence, cigarettes, uncomplicated: Secondary | ICD-10-CM | POA: Insufficient documentation

## 2021-02-16 DIAGNOSIS — J449 Chronic obstructive pulmonary disease, unspecified: Secondary | ICD-10-CM | POA: Insufficient documentation

## 2021-02-16 DIAGNOSIS — Z8616 Personal history of COVID-19: Secondary | ICD-10-CM | POA: Insufficient documentation

## 2021-02-16 DIAGNOSIS — W19XXXA Unspecified fall, initial encounter: Secondary | ICD-10-CM | POA: Diagnosis not present

## 2021-02-16 DIAGNOSIS — I509 Heart failure, unspecified: Secondary | ICD-10-CM | POA: Insufficient documentation

## 2021-02-16 DIAGNOSIS — E039 Hypothyroidism, unspecified: Secondary | ICD-10-CM | POA: Insufficient documentation

## 2021-02-16 DIAGNOSIS — I130 Hypertensive heart and chronic kidney disease with heart failure and stage 1 through stage 4 chronic kidney disease, or unspecified chronic kidney disease: Secondary | ICD-10-CM | POA: Diagnosis not present

## 2021-02-16 DIAGNOSIS — Z8679 Personal history of other diseases of the circulatory system: Secondary | ICD-10-CM | POA: Diagnosis not present

## 2021-02-16 DIAGNOSIS — E119 Type 2 diabetes mellitus without complications: Secondary | ICD-10-CM | POA: Insufficient documentation

## 2021-02-16 DIAGNOSIS — Z7982 Long term (current) use of aspirin: Secondary | ICD-10-CM | POA: Insufficient documentation

## 2021-02-16 DIAGNOSIS — Z79899 Other long term (current) drug therapy: Secondary | ICD-10-CM | POA: Insufficient documentation

## 2021-02-16 DIAGNOSIS — M25512 Pain in left shoulder: Secondary | ICD-10-CM | POA: Insufficient documentation

## 2021-02-16 DIAGNOSIS — N1832 Chronic kidney disease, stage 3b: Secondary | ICD-10-CM | POA: Diagnosis not present

## 2021-02-16 DIAGNOSIS — Z7984 Long term (current) use of oral hypoglycemic drugs: Secondary | ICD-10-CM | POA: Insufficient documentation

## 2021-02-16 DIAGNOSIS — S0003XA Contusion of scalp, initial encounter: Secondary | ICD-10-CM | POA: Diagnosis not present

## 2021-02-16 LAB — CBG MONITORING, ED: Glucose-Capillary: 128 mg/dL — ABNORMAL HIGH (ref 70–99)

## 2021-02-16 NOTE — ED Provider Notes (Signed)
[facility]  ____________________________________________   Event Date/Time   First MD Initiated Contact with Patient 02/16/21 [phone]     (approximate)  I have reviewed the triage vital signs and the nursing notes.   HISTORY  Chief Complaint Fall    HPI Melanie King is a 75 y.o. female past medical history of CHF, COPD, diabetes, GERD, hypertension, hyperlipidemia who presents from her facility after an unwitnessed fall.  Patient is really not able to provide a whole lot of history.  She complains of left shoulder pain.  Patient seen several days ago for similar.       Past Medical History:  Diagnosis Date   Arthritis    CHF (congestive heart failure) (HCC)    COPD (chronic obstructive pulmonary disease) (HCC)    Coronary arteriosclerosis    DDD (degenerative disc disease), cervical    DDD (degenerative disc disease), lumbar    Depression    Diabetes mellitus without complication (HCC)    GERD (gastroesophageal reflux disease)    Hyperlipidemia    Hypertension    Osteoarthritis    Pacemaker    Tremor     Patient Active Problem List   Diagnosis Date Noted   Malnutrition of moderate degree 08/28/2020   Elevated troponin 08/26/2020   CHF (congestive heart failure) (HCC) 08/26/2020   Carotid stenosis 04/12/2020   Dysphagia 07/19/2019   Acute respiratory failure due to COVID-19 (HCC) 07/16/2019   Respiratory distress    Acute on chronic congestive heart failure (HCC)    Acute respiratory failure with hypoxia (HCC) 07/15/2019   Frequent falls 05/26/2019   Generalized weakness 05/26/2019   Acute kidney injury superimposed on CKD lllb (HCC) 03/21/2019   Subdural hematoma 03/20/2019   Cardiac pacemaker 03/17/2019   Hypothyroidism 03/10/2019   Depression    Acute on chronic diastolic CHF (congestive heart failure) (HCC)    Acute metabolic encephalopathy    Fall    Tobacco abuse    C6 cervical fracture (HCC)    Left hand pain 04/01/2018    Numbness and tingling in left hand 04/01/2018   Left hand weakness 03/05/2018   Confusion 05/12/2017   Seizure (HCC) 05/10/2017   Elevated sedimentation rate 04/04/2017   Elevated C-reactive protein (CRP) 04/04/2017   Chest pain, unspecified 04/02/2017   Bilateral lower extremity pain (Secondary Area of Pain) (R>L) 04/02/2017   Chronic hip pain, bilateral  (Secondary Area of Pain) (R>L) 04/02/2017   Chronic bilateral low back pain with bilateral sciatica [facility] Area of Pain) (R>L) 04/02/2017   Chronic pain syndrome 04/02/2017   Disorder of bone, unspecified 04/02/2017   Other long term (current) drug therapy 04/02/2017   Other specified health status 04/02/2017   Long term current use of opiate analgesic 04/02/2017   Hip pain, bilateral 12/06/2016   Tremor 05/27/2016   Trigger finger of left hand 02/16/2016   Osteoarthritis of both hands 02/16/2016   Angina pectoris (HCC) 01/17/2016   COPD with acute exacerbation (HCC) 01/17/2016   CAD (coronary artery disease) 01/17/2016   HLD (hyperlipidemia) 01/17/2016   Hypertension 01/17/2016   Sick sinus syndrome (HCC) 01/17/2016   Chronic pain 01/17/2016   Personal history of tobacco use, presenting hazards to health 11/16/2015   Type 2 diabetes mellitus without complication, with long-term current use of insulin (HCC) 05/12/2013    Past Surgical History:  Procedure Laterality Date   ABDOMINAL HYSTERECTOMY     BREAST BIOPSY Right 1994   neg cyst removed   CATARACT  EXTRACTION W/PHACO Right 08/17/2020   Procedure: CATARACT EXTRACTION PHACO AND INTRAOCULAR LENS PLACEMENT (IOC) RIGHT DIABETIC 8.90 00:52.9;  Surgeon: Birder Robson, MD;  Location: [place];  Service: Ophthalmology;  Laterality: Right;  Diabetic   CHOLECYSTECTOMY     EYE SURGERY  1964, 1966, and 1967   bilateral   FOOT SURGERY Right    cellulitis   PACEMAKER INSERTION     PPM GENERATOR CHANGEOUT N/A 02/24/2020   Procedure: PPM GENERATOR CHANGEOUT;   Surgeon: Isaias Cowman, MD;  Location: [facility];  Service: Cardiovascular;  Laterality: N/A;   SPINE SURGERY     cyst removed; [facility]    Prior to Admission medications   Medication Sig Start Date End Date Taking? Authorizing Provider  acetaminophen (TYLENOL) 500 MG tablet Take 500 mg by mouth every 6 (six) hours as needed for mild pain or moderate pain.    [provider]  amLODipine (NORVASC) 10 MG tablet TAKE 1 TABLET BY MOUTH EVERY DAY Patient not taking: Reported on 12/28/2020 08/28/16   Kathrine Haddock, NP  amLODipine (NORVASC) 5 MG tablet Take 5 mg by mouth daily.    [provider]  ASPIRIN LOW DOSE 81 MG chewable tablet Chew 81 mg by mouth daily. 07/27/20   [provider]  benztropine (COGENTIN) 0.5 MG tablet Take 0.5 mg by mouth 2 (two) times daily.    [provider]  brexpiprazole (REXULTI) 2 MG TABS tablet Take 1 mg by mouth at bedtime.    [provider]  calcitRIOL (ROCALTROL) 0.25 MCG capsule Take 0.25 mcg by mouth daily.    [provider]  carbidopa-levodopa (SINEMET IR) 25-100 MG tablet Take 1 tablet by mouth 3 (three) times daily. 07/27/20   [provider]  cholecalciferol (VITAMIN D3) 25 MCG (1000 UNIT) tablet Take 400 Units by mouth daily.     [provider]  citalopram (CELEXA) 20 MG tablet Take 20 mg by mouth daily.    [provider]  DUREZOL 0.05 % EMUL Apply 1 drop to eye 2 (two) times daily. Patient not taking: Reported on 12/26/2020 07/28/20   [provider]  erythromycin ophthalmic ointment Apply a thin strip of ointment (about 1/4") to the lower eyelid of each affected eye every 6 hours x 1 week. 01/08/21   Delman Kitten, MD  fenofibrate (TRICOR) 48 MG tablet Take 48 mg by mouth daily.    [provider]  fluticasone (FLONASE) 50 MCG/ACT nasal spray Place 2 sprays into both nostrils daily.    [provider]  fluticasone  furoate-vilanterol (BREO ELLIPTA) 200-25 MCG/INH AEPB Inhale 1 puff into the lungs daily. 08/31/20   Sharen Hones, MD  gabapentin (NEURONTIN) 100 MG capsule Take 100 mg by mouth at bedtime.    [provider]  guaiFENesin (ROBITUSSIN) 100 MG/5ML liquid Take 5 mLs by mouth every 6 (six) hours as needed for cough or to loosen phlegm. Patient not taking: Reported on 12/28/2020    [provider]  hydroxypropyl methylcellulose / hypromellose (ISOPTO TEARS / GONIOVISC) 2.5 % ophthalmic solution Place 1 drop into the right eye 4 (four) times daily.    [provider]  ILEVRO 0.3 % ophthalmic suspension Place 1 drop into the right eye daily. 12/04/20   [provider]  Lactobacillus Acid-Pectin (ACIDOPHILUS/PECTIN) CAPS Take 1 capsule by mouth in the morning and at bedtime. 07/27/20   [provider]  levothyroxine (SYNTHROID) 150 MCG tablet Take 150 mcg by mouth daily. 07/27/20  [provider]  loperamide (IMODIUM A-D) 2 MG tablet Take 2 mg by mouth 4 (four) times daily as needed for diarrhea or loose stools.    [provider]  losartan (COZAAR) 50 MG tablet Take 50 mg by mouth daily.    [provider]  magnesium oxide (MAG-OX) 400 MG tablet Take 400 mg by mouth daily.    [provider]  metFORMIN (GLUCOPHAGE) 1000 MG tablet Take 0.5 tablets (500 mg total) by mouth 2 (two) times daily with a meal. 05/28/19   Dhungel, Nishant, MD  metoprolol tartrate (LOPRESSOR) 25 MG tablet Take 25 mg by mouth 2 (two) times daily.    [provider]  montelukast (SINGULAIR) 10 MG tablet Take 10 mg by mouth daily.    [provider]  nitroGLYCERIN (NITRODUR - DOSED IN MG/24 HR) 0.2 mg/hr patch Place 0.2 mg onto the skin daily. leave patch on 12-14 hours, then remove for 10-12 hours prior to applying the next patch    [provider]  pantoprazole (PROTONIX) 40 MG tablet Take 40 mg by mouth daily.    [provider]  primidone (MYSOLINE) 50 MG tablet Take 50 mg by mouth 2 (two) times daily.    [provider]  primidone (MYSOLINE) 50 MG tablet Take by mouth 4 (four) times daily. Patient not taking: Reported on 12/28/2020    [provider]  senna-docusate (SENOKOT-S) 8.6-50 MG tablet Take 2 tablets by mouth 2 (two) times daily.    [provider]  sitaGLIPtin (JANUVIA) 50 MG tablet Take 50 mg by mouth daily.    [provider]  torsemide (DEMADEX) 20 MG tablet Take 1 tablet (20 mg total) by mouth daily. 08/30/20   Marrion Coy, MD  traMADol (ULTRAM) 50 MG tablet Take 1 tablet (50 mg total) by mouth every 8 (eight) hours as needed for moderate pain (left shoulder). 01/08/21   Sharyn Creamer, MD  traZODone (DESYREL) 50 MG tablet Take by mouth at bedtime as needed for sleep.    [provider]  vitamin B-12 (CYANOCOBALAMIN) 1000 MCG tablet Take 2,000 mcg by mouth daily.    [provider]    Allergies Alprazolam, Fentanyl, Meperidine, Meperidine hcl, and Ranitidine hcl  Family History  Problem Relation Age of Onset   Breast cancer Maternal Aunt 37   Cancer Mother        colon   Aneurysm Father     Social History Social History   Tobacco Use   Smoking status: Every Day    Packs/day: 1.00    Years: 50.00    Pack years: 50.00    Types: Cigarettes   Smokeless tobacco: Never  Vaping Use   Vaping Use: Every day  Substance Use Topics   Alcohol use: No   Drug use: No    Review of Systems   Review of Systems  Cardiovascular:  Negative for chest pain.  Gastrointestinal:  Negative for abdominal distention.  Musculoskeletal:  Positive for arthralgias. Negative for back pain.  Neurological:  Positive for headaches.  All other systems reviewed and are negative.  Physical Exam Updated Vital Signs BP (!) 159/92 (BP Location: Right Arm)    Pulse 78    Temp (!) 97.4 F (36.3 C) (Oral)    Resp 19    Ht 5\' 1"  (1.549 m)    Wt 58 kg    LMP   (LMP Unknown)    SpO2 98%    BMI 24.16 kg/m  Physical Exam Vitals and nursing note reviewed.  Constitutional:      General: She is not in acute distress.    Appearance: Normal appearance.  HENT:     Head: Normocephalic.     Comments: Hematoma in the left posterior occiput Eyes:     General: No scleral icterus.    Conjunctiva/sclera: Conjunctivae normal.     Pupils: Pupils are equal, round, and reactive to light.  Pulmonary:     Effort: Pulmonary effort is normal. No respiratory distress.     Breath sounds: No stridor.  Abdominal:     General: Abdomen is flat. There is no distension.     Palpations: Abdomen is soft.     Tenderness: There is no abdominal tenderness. There is no guarding.  Musculoskeletal:        General: No deformity or signs of injury.     Cervical back: Normal range of motion.     Comments: Tenderness to palpation of the left anterior shoulder, no obvious deformity, pain with abduction, no tenderness or swelling of the left hand wrist forearm or elbow  Bilateral lower extremities are nontender without deformity she is able to range both hips without pain  Skin:    General: Skin is dry.     Coloration: Skin is not jaundiced or pale.  Neurological:     Mental Status: She is alert.     Comments: Patient is hard of hearing, somewhat dysarthric and difficult to understand, she moves all extremities spontaneously she is oriented to person  Psychiatric:        Mood and Affect: Mood normal.        Behavior: Behavior normal.     LABS (all labs ordered are listed, but only abnormal results are displayed)  Labs Reviewed  CBG MONITORING, ED - Abnormal; Notable for the following components:      Result Value   Glucose-Capillary 128 (*)    All other components within normal limits   ____________________________________________  EKG  LVH, left axis deviation, subtle ST depression in lead II, otherwise no acute ischemic  changes ____________________________________________  RADIOLOGY I, Madelin Headings, personally viewed and evaluated these images (plain radiographs) as part of my medical decision making, as well as reviewing the written report by the radiologist.  ED MD interpretation:  I reviewed the CT scan of the brain which does not show any acute intracranial process   .I reviewed the CT of the cervical spine which does not show any acute fracture or misalignment   Reviewed the x-ray of the left shoulder which does not show any fracture dislocation    ____________________________________________   PROCEDURES  Procedure(s) performed (including Critical Care):  Procedures   ____________________________________________   INITIAL IMPRESSION / ASSESSMENT AND PLAN / ED COURSE     Patient is a 75 year old female presents from her facility after an unwitnessed fall.  She was here several days ago for similar.  Patient apparently falls very frequently.  Questionable whether she is on anticoagulation.  She has a hematoma on the posterior occiput as well as left anterior shoulder tenderness but no other signs of trauma.  Plan to obtain a CT head, C-spine and x-rays of the shoulder.  She recently had labs so feel that repeating them would be low yield at this point.  We will get an EKG and an Accu-Chek as well given unclear whether this was syncope.    Patient's shoulder x-ray is negative.  Her CT head does not have  any skull fracture or traumatic injury.  She is stable for discharge back to her facility.  ____________________________________________   FINAL CLINICAL IMPRESSION(S) / ED DIAGNOSES  Final diagnoses:  Fall, initial encounter  Hematoma of scalp, initial encounter     ED Discharge Orders     None        Note:  This document was prepared using Dragon voice recognition software and may include unintentional dictation errors.    Rada Hay, MD 02/16/21 [phone]

## 2021-02-16 NOTE — ED Triage Notes (Signed)
Pt came from the [facility] [place]- pt unwitnessed fall- pt hit head and has a hematoma to the posterior head. Pt VSS at this time. Pt does complain of pain to the left shoulder.

## 2021-02-16 NOTE — ED Notes (Signed)
[facility] CALLED FOR TRANSPORT BACK TO [facility]

## 2021-02-16 NOTE — Discharge Instructions (Addendum)
The CAT scan of the head and neck did not show any traumatic injury.  An x-ray of the shoulder also did not show any broken bones.

## 2021-03-04 ENCOUNTER — Emergency Department: Payer: Medicare Other

## 2021-03-04 ENCOUNTER — Other Ambulatory Visit: Payer: Self-pay

## 2021-03-04 ENCOUNTER — Emergency Department
Admission: EM | Admit: 2021-03-04 | Discharge: 2021-03-04 | Disposition: A | Discharge status: Home / Self Care | Payer: Medicare Other | Attending: Emergency Medicine | Admitting: Emergency Medicine

## 2021-03-04 DIAGNOSIS — F17210 Nicotine dependence, cigarettes, uncomplicated: Secondary | ICD-10-CM | POA: Diagnosis not present

## 2021-03-04 DIAGNOSIS — Z23 Encounter for immunization: Secondary | ICD-10-CM | POA: Insufficient documentation

## 2021-03-04 DIAGNOSIS — I2510 Atherosclerotic heart disease of native coronary artery without angina pectoris: Secondary | ICD-10-CM | POA: Insufficient documentation

## 2021-03-04 DIAGNOSIS — J449 Chronic obstructive pulmonary disease, unspecified: Secondary | ICD-10-CM | POA: Diagnosis not present

## 2021-03-04 DIAGNOSIS — I110 Hypertensive heart disease with heart failure: Secondary | ICD-10-CM | POA: Insufficient documentation

## 2021-03-04 DIAGNOSIS — E119 Type 2 diabetes mellitus without complications: Secondary | ICD-10-CM | POA: Diagnosis not present

## 2021-03-04 DIAGNOSIS — E039 Hypothyroidism, unspecified: Secondary | ICD-10-CM | POA: Insufficient documentation

## 2021-03-04 DIAGNOSIS — Z950 Presence of cardiac pacemaker: Secondary | ICD-10-CM | POA: Insufficient documentation

## 2021-03-04 DIAGNOSIS — S0101XA Laceration without foreign body of scalp, initial encounter: Secondary | ICD-10-CM | POA: Diagnosis not present

## 2021-03-04 DIAGNOSIS — Z79899 Other long term (current) drug therapy: Secondary | ICD-10-CM | POA: Diagnosis not present

## 2021-03-04 DIAGNOSIS — S0990XA Unspecified injury of head, initial encounter: Secondary | ICD-10-CM | POA: Diagnosis present

## 2021-03-04 DIAGNOSIS — S0001XA Abrasion of scalp, initial encounter: Secondary | ICD-10-CM

## 2021-03-04 DIAGNOSIS — I5033 Acute on chronic diastolic (congestive) heart failure: Secondary | ICD-10-CM | POA: Diagnosis not present

## 2021-03-04 MED ORDER — TETANUS-DIPHTHERIA TOXOIDS TD 5-2 LFU IM INJ
0.5000 mL | INJECTION | Freq: Once | INTRAMUSCULAR | Status: AC
  Administered 2021-03-04: 20:00:00 0.5 mL via INTRAMUSCULAR
  Filled 2021-03-04: qty 0.5

## 2021-03-04 NOTE — Discharge Instructions (Addendum)
Your CT of your head and neck did not show any traumatic findings.  Your tetanus was updated.

## 2021-03-04 NOTE — ED Notes (Signed)
Pt discharge information reviewed. Pt understands importance for need of follow up care, and when to return if symptoms worsen.  Pt understands all information. All questions answered. Pt to be brought out of department by ambulance back to [facility]. Facility is aware.

## 2021-03-04 NOTE — ED Triage Notes (Signed)
Per EMS, Pt, from [facility] [place], presents after a fall.  Pt fell getting out of wheelchair because the wheels were not locked.  Laceration noted to posterior head.  Bleeding controlled.

## 2021-03-04 NOTE — ED Provider Notes (Signed)
[facility]  ____________________________________________   Event Date/Time   First MD Initiated Contact with Patient 03/04/21 1728     (approximate)  I have reviewed the triage vital signs and the nursing notes.   HISTORY  Chief Complaint Fall and Head Injury    HPI [place] F Fluhr is a 75 y.o. female with past medical history of hypertension, hyperlipidemia, diabetes, CHF and COPD who presents after a fall.  Patient had a witnessed fall today which occurred when she was getting out of her wheelchair.  The wheels were not locked and she fell backward.  Patient does not member if she lost consciousness.  She and does endorse mild pain in her head.  Denies nausea vomiting visual change numbness or weakness.  She denies neck pain chest pain or shortness of breath.  Patient has frequent falls.         Past Medical History:  Diagnosis Date   Arthritis    CHF (congestive heart failure) (HCC)    COPD (chronic obstructive pulmonary disease) (HCC)    Coronary arteriosclerosis    DDD (degenerative disc disease), cervical    DDD (degenerative disc disease), lumbar    Depression    Diabetes mellitus without complication (HCC)    GERD (gastroesophageal reflux disease)    Hyperlipidemia    Hypertension    Osteoarthritis    Pacemaker    Tremor     Patient Active Problem List   Diagnosis Date Noted   Malnutrition of moderate degree 08/28/2020   Elevated troponin 08/26/2020   CHF (congestive heart failure) (HCC) 08/26/2020   Carotid stenosis 04/12/2020   Dysphagia 07/19/2019   Acute respiratory failure due to COVID-19 (HCC) 07/16/2019   Respiratory distress    Acute on chronic congestive heart failure (HCC)    Acute respiratory failure with hypoxia (HCC) 07/15/2019   Frequent falls 05/26/2019   Generalized weakness 05/26/2019   Acute kidney injury superimposed on CKD lllb (HCC) 03/21/2019   Subdural hematoma 03/20/2019   Cardiac pacemaker 03/17/2019    Hypothyroidism 03/10/2019   Depression    Acute on chronic diastolic CHF (congestive heart failure) (HCC)    Acute metabolic encephalopathy    Fall    Tobacco abuse    C6 cervical fracture (HCC)    Left hand pain 04/01/2018   Numbness and tingling in left hand 04/01/2018   Left hand weakness 03/05/2018   Confusion 05/12/2017   Seizure (HCC) 05/10/2017   Elevated sedimentation rate 04/04/2017   Elevated C-reactive protein (CRP) 04/04/2017   Chest pain, unspecified 04/02/2017   Bilateral lower extremity pain (Secondary Area of Pain) (R>L) 04/02/2017   Chronic hip pain, bilateral  (Secondary Area of Pain) (R>L) 04/02/2017   Chronic bilateral low back pain with bilateral sciatica [facility] Area of Pain) (R>L) 04/02/2017   Chronic pain syndrome 04/02/2017   Disorder of bone, unspecified 04/02/2017   Other long term (current) drug therapy 04/02/2017   Other specified health status 04/02/2017   Long term current use of opiate analgesic 04/02/2017   Hip pain, bilateral 12/06/2016   Tremor 05/27/2016   Trigger finger of left hand 02/16/2016   Osteoarthritis of both hands 02/16/2016   Angina pectoris (HCC) 01/17/2016   COPD with acute exacerbation (HCC) 01/17/2016   CAD (coronary artery disease) 01/17/2016   HLD (hyperlipidemia) 01/17/2016   Hypertension 01/17/2016   Sick sinus syndrome (HCC) 01/17/2016   Chronic pain 01/17/2016   Personal history of tobacco use, presenting hazards to health 11/16/2015  Type 2 diabetes mellitus without complication, with long-term current use of insulin (HCC) 05/12/2013    Past Surgical History:  Procedure Laterality Date   ABDOMINAL HYSTERECTOMY     BREAST BIOPSY Right 1994   neg cyst removed   CATARACT EXTRACTION W/PHACO Right 08/17/2020   Procedure: CATARACT EXTRACTION PHACO AND INTRAOCULAR LENS PLACEMENT (IOC) RIGHT DIABETIC 8.90 00:52.9;  Surgeon: Galen Manila, MD;  Location: [facility];  Service: Ophthalmology;  Laterality:  Right;  Diabetic   CHOLECYSTECTOMY     EYE SURGERY  1964, 1966, and 1967   bilateral   FOOT SURGERY Right    cellulitis   PACEMAKER INSERTION     PPM GENERATOR CHANGEOUT N/A 02/24/2020   Procedure: PPM GENERATOR CHANGEOUT;  Surgeon: Marcina Millard, MD;  Location: [facility];  Service: Cardiovascular;  Laterality: N/A;   SPINE SURGERY     cyst removed; [facility]    Prior to Admission medications   Medication Sig Start Date End Date Taking? Authorizing Provider  acetaminophen (TYLENOL) 500 MG tablet Take 500 mg by mouth every 6 (six) hours as needed for mild pain or moderate pain.    [provider]  amLODipine (NORVASC) 10 MG tablet TAKE 1 TABLET BY MOUTH EVERY DAY Patient not taking: Reported on 12/28/2020 08/28/16   Gabriel Cirri, NP  amLODipine (NORVASC) 5 MG tablet Take 5 mg by mouth daily.    [provider]  ASPIRIN LOW DOSE 81 MG chewable tablet Chew 81 mg by mouth daily. 07/27/20   [provider]  benztropine (COGENTIN) 0.5 MG tablet Take 0.5 mg by mouth 2 (two) times daily.    [provider]  brexpiprazole (REXULTI) 2 MG TABS tablet Take 1 mg by mouth at bedtime.    [provider]  calcitRIOL (ROCALTROL) 0.25 MCG capsule Take 0.25 mcg by mouth daily.    [provider]  carbidopa-levodopa (SINEMET IR) 25-100 MG tablet Take 1 tablet by mouth 3 (three) times daily. 07/27/20   [provider]  cholecalciferol (VITAMIN D3) 25 MCG (1000 UNIT) tablet Take 400 Units by mouth daily.     [provider]  citalopram (CELEXA) 20 MG tablet Take 20 mg by mouth daily.    [provider]  DUREZOL 0.05 % EMUL Apply 1 drop to eye 2 (two) times daily. Patient not taking: Reported on 12/26/2020 07/28/20   [provider]  erythromycin ophthalmic ointment Apply a thin strip of ointment (about 1/4") to the lower eyelid of each affected eye every 6 hours x 1 week. 01/08/21   Sharyn Creamer, MD   fenofibrate (TRICOR) 48 MG tablet Take 48 mg by mouth daily.    [provider]  fluticasone (FLONASE) 50 MCG/ACT nasal spray Place 2 sprays into both nostrils daily.    [provider]  fluticasone furoate-vilanterol (BREO ELLIPTA) 200-25 MCG/INH AEPB Inhale 1 puff into the lungs daily. 08/31/20   Marrion Coy, MD  gabapentin (NEURONTIN) 100 MG capsule Take 100 mg by mouth at bedtime.    [provider]  guaiFENesin (ROBITUSSIN) 100 MG/5ML liquid Take 5 mLs by mouth every 6 (six) hours as needed for cough or to loosen phlegm. Patient not taking: Reported on 12/28/2020    [provider]  hydroxypropyl methylcellulose / hypromellose (ISOPTO TEARS / GONIOVISC) 2.5 % ophthalmic solution Place 1 drop into the right eye 4 (four) times daily.    [provider]  ILEVRO 0.3 % ophthalmic suspension Place 1 drop into the  right eye daily. 12/04/20   [provider]  Lactobacillus Acid-Pectin (ACIDOPHILUS/PECTIN) CAPS Take 1 capsule by mouth in the morning and at bedtime. 07/27/20   [provider]  levothyroxine (SYNTHROID) 150 MCG tablet Take 150 mcg by mouth daily. 07/27/20   [provider]  loperamide (IMODIUM A-D) 2 MG tablet Take 2 mg by mouth 4 (four) times daily as needed for diarrhea or loose stools.    [provider]  losartan (COZAAR) 50 MG tablet Take 50 mg by mouth daily.    [provider]  magnesium oxide (MAG-OX) 400 MG tablet Take 400 mg by mouth daily.    [provider]  metFORMIN (GLUCOPHAGE) 1000 MG tablet Take 0.5 tablets (500 mg total) by mouth 2 (two) times daily with a meal. 05/28/19   Dhungel, Nishant, MD  metoprolol tartrate (LOPRESSOR) 25 MG tablet Take 25 mg by mouth 2 (two) times daily.    [provider]  montelukast (SINGULAIR) 10 MG tablet Take 10 mg by mouth daily.    [provider]  nitroGLYCERIN (NITRODUR - DOSED IN MG/24 HR) 0.2 mg/hr patch Place 0.2 mg onto  the skin daily. leave patch on 12-14 hours, then remove for 10-12 hours prior to applying the next patch    [provider]  pantoprazole (PROTONIX) 40 MG tablet Take 40 mg by mouth daily.    [provider]  primidone (MYSOLINE) 50 MG tablet Take 50 mg by mouth 2 (two) times daily.    [provider]  primidone (MYSOLINE) 50 MG tablet Take by mouth 4 (four) times daily. Patient not taking: Reported on 12/28/2020    [provider]  senna-docusate (SENOKOT-S) 8.6-50 MG tablet Take 2 tablets by mouth 2 (two) times daily.    [provider]  sitaGLIPtin (JANUVIA) 50 MG tablet Take 50 mg by mouth daily.    [provider]  torsemide (DEMADEX) 20 MG tablet Take 1 tablet (20 mg total) by mouth daily. 08/30/20   Sharen Hones, MD  traMADol (ULTRAM) 50 MG tablet Take 1 tablet (50 mg total) by mouth every 8 (eight) hours as needed for moderate pain (left shoulder). 01/08/21   Delman Kitten, MD  traZODone (DESYREL) 50 MG tablet Take by mouth at bedtime as needed for sleep.    [provider]  vitamin B-12 (CYANOCOBALAMIN) 1000 MCG tablet Take 2,000 mcg by mouth daily.    [provider]    Allergies Alprazolam, Fentanyl, Meperidine, Meperidine hcl, and Ranitidine hcl  Family History  Problem Relation Age of Onset   Breast cancer Maternal Aunt 15   Cancer Mother        colon   Aneurysm Father     Social History Social History   Tobacco Use   Smoking status: Every Day    Packs/day: 1.00    Years: 50.00    Pack years: 50.00    Types: Cigarettes   Smokeless tobacco: Never  Vaping Use   Vaping Use: Every day  Substance Use Topics   Alcohol use: No   Drug use: No    Review of Systems   Review of Systems  Respiratory:  Negative for shortness of breath.   Cardiovascular:  Negative for chest pain.  Musculoskeletal:  Negative for back pain and neck pain.  Skin:  Positive for wound.  Neurological:  Positive for headaches.   All other systems reviewed and are negative.  Physical Exam Updated Vital Signs BP (!) 163/76    Pulse  76    Temp 98.5 F (36.9 C) (Oral)    Resp 17    Ht 5\' 1"  (1.549 m)    Wt 57.6 kg    LMP  (LMP Unknown)    SpO2 100%    BMI 24.00 kg/m   Physical Exam Vitals and nursing note reviewed.  Constitutional:      General: She is not in acute distress.    Appearance: Normal appearance.  HENT:     Head: Normocephalic.     Comments: There is erythema and swelling on the posterior occiput on the left There is approximately 2 cm linear superficial laceration/abrasion the posterior occiput, no bleeding Eyes:     General: No scleral icterus.    Conjunctiva/sclera: Conjunctivae normal.  Pulmonary:     Effort: Pulmonary effort is normal. No respiratory distress.     Breath sounds: No stridor.  Abdominal:     General: Abdomen is flat. There is no distension.     Palpations: Abdomen is soft.     Tenderness: There is no abdominal tenderness.  Musculoskeletal:        General: No deformity or signs of injury.     Cervical back: Normal range of motion.     Comments: No C, T or L-spine tenderness, chest wall is nontender, abdomen is nontender, pelvis is stable nontender no focal tenderness swelling or deformity of the bilateral upper or lower extremities, normal range of motion of the hips  Skin:    General: Skin is dry.     Coloration: Skin is not jaundiced or pale.  Neurological:     Mental Status: She is alert.     Comments: Patient is very hard of hearing, she is oriented to person place and time, has some difficulty answering complex questions  PERRLA Moves all extremities   Psychiatric:        Mood and Affect: Mood normal.        Behavior: Behavior normal.     LABS (all labs ordered are listed, but only abnormal results are displayed)  Labs Reviewed - No data to  display ____________________________________________  EKG   ____________________________________________  RADIOLOGY Almeta Monas, personally viewed and evaluated these images (plain radiographs) as part of my medical decision making, as well as reviewing the written report by the radiologist.  ED MD interpretation:  I reviewed the CT scan of the brain which does not show any acute intracranial process   I reviewed the CT of the cervical spine which does not show any acute fracture or misalignment      ____________________________________________   PROCEDURES  Procedure(s) performed (including Critical Care):  Procedures   ____________________________________________   INITIAL IMPRESSION / [place] / ED COURSE   Patient is a 75 year old female who presents after witnessed fall that occurred while she was getting out of her wheelchair when the wheels were unlocked.  Patient cannot remember some of the history but most the details are unclear.  She does endorse a mild headache and pain at the site of the laceration on her head but otherwise no other complaints.  She appears well on exam.  There is some swelling in the posterior occiput as well as a horizontal very superficial linear abrasion/laceration that does not require repair.  Is hemostatic.  Wound was cleaned out.  Tetanus was updated.  She otherwise has no inner notes or signs of trauma on exam.  CT head and C-spine are negative.  She is stable for  discharge.      ____________________________________________   FINAL CLINICAL IMPRESSION(S) / ED DIAGNOSES  Final diagnoses:  Injury of head, initial encounter  Abrasion of scalp, initial encounter     ED Discharge Orders     None        Note:  This document was prepared using Dragon voice recognition software and may include unintentional dictation errors.    Rada Hay, MD 03/04/21 [phone]

## 2021-03-04 NOTE — ED Notes (Signed)
EMS here to pick up pt and bring back to faciltiy

## 2021-03-04 NOTE — ED Notes (Signed)
This RN spoke to [place] from the [employer]. Updated on plan of care and notified that pt will be discharged and returning back to facility. Facility understands all information.

## 2021-03-24 ENCOUNTER — Ambulatory Visit: Payer: Medicare Other | Attending: Family | Admitting: Family

## 2021-03-24 ENCOUNTER — Encounter: Payer: Self-pay | Admitting: Family

## 2021-03-24 ENCOUNTER — Other Ambulatory Visit: Payer: Self-pay

## 2021-03-24 VITALS — BP 153/78 | HR 71 | Resp 18 | Ht 61.0 in | Wt 135.1 lb

## 2021-03-24 DIAGNOSIS — E785 Hyperlipidemia, unspecified: Secondary | ICD-10-CM | POA: Diagnosis not present

## 2021-03-24 DIAGNOSIS — N1832 Chronic kidney disease, stage 3b: Secondary | ICD-10-CM

## 2021-03-24 DIAGNOSIS — Z950 Presence of cardiac pacemaker: Secondary | ICD-10-CM | POA: Insufficient documentation

## 2021-03-24 DIAGNOSIS — K219 Gastro-esophageal reflux disease without esophagitis: Secondary | ICD-10-CM | POA: Diagnosis not present

## 2021-03-24 DIAGNOSIS — E119 Type 2 diabetes mellitus without complications: Secondary | ICD-10-CM | POA: Insufficient documentation

## 2021-03-24 DIAGNOSIS — I2510 Atherosclerotic heart disease of native coronary artery without angina pectoris: Secondary | ICD-10-CM | POA: Insufficient documentation

## 2021-03-24 DIAGNOSIS — E1122 Type 2 diabetes mellitus with diabetic chronic kidney disease: Secondary | ICD-10-CM | POA: Diagnosis not present

## 2021-03-24 DIAGNOSIS — Z794 Long term (current) use of insulin: Secondary | ICD-10-CM

## 2021-03-24 DIAGNOSIS — I10 Essential (primary) hypertension: Secondary | ICD-10-CM

## 2021-03-24 DIAGNOSIS — I110 Hypertensive heart disease with heart failure: Secondary | ICD-10-CM | POA: Insufficient documentation

## 2021-03-24 DIAGNOSIS — F32A Depression, unspecified: Secondary | ICD-10-CM | POA: Diagnosis not present

## 2021-03-24 DIAGNOSIS — F17210 Nicotine dependence, cigarettes, uncomplicated: Secondary | ICD-10-CM | POA: Diagnosis not present

## 2021-03-24 DIAGNOSIS — J449 Chronic obstructive pulmonary disease, unspecified: Secondary | ICD-10-CM | POA: Insufficient documentation

## 2021-03-24 DIAGNOSIS — I5032 Chronic diastolic (congestive) heart failure: Secondary | ICD-10-CM | POA: Insufficient documentation

## 2021-03-24 NOTE — Progress Notes (Signed)
Patient ID: Melanie King, female    DOB: 29-Jan-1946, 76 y.o.   MRN: [id number]  HPI  Melanie King is a 76 y/o female with a history of CAD, DM, hyperlipidemia, HTN, GERD, depression, pacemaker, COPD, current tobacco use and chronic heart failure.   Echo report from 08/27/20 reviewed and showed an EF of 55-60% without structural changes. Echo report from 07/15/19 reviewed and showed an EF of 60-65% along with moderate LVH, mild LAE and trace MR.   Melanie King was in the ED 03/04/21 due to mechanical fall out of her wheelchair where Melanie King hit her head. Head CT and c-spine were negative. Laceration sutured and Melanie King was released. Had 3 other ED visits in December. Had 3 ED visits in November. Had 2 ED visits in October. Majority of all these visits are due to a fall.   Melanie King presents today for a follow-up visit with a chief complaint of moderate fatigue with minimal exertion. Melanie King describes this as having been present for several years. Melanie King has associated dizziness, leg weakness, depression, difficulty sleeping at night (sleeping lots during the day) and hearing loss along with this. Melanie King denies any abdominal distention, palpitations, pedal edema, chest pain, shortness of breath, cough or weight gain.   Medication list was faxed from facility and it shows daily weight and that her weight is stable within 2-3 pounds.   Past Medical History:  Diagnosis Date   Arthritis    CHF (congestive heart failure) (HCC)    COPD (chronic obstructive pulmonary disease) (HCC)    Coronary arteriosclerosis    DDD (degenerative disc disease), cervical    DDD (degenerative disc disease), lumbar    Depression    Diabetes mellitus without complication (HCC)    GERD (gastroesophageal reflux disease)    Hyperlipidemia    Hypertension    Osteoarthritis    Pacemaker    Tremor    Past Surgical History:  Procedure Laterality Date   ABDOMINAL HYSTERECTOMY     BREAST BIOPSY Right 1994   neg cyst removed   CATARACT EXTRACTION  W/PHACO Right 08/17/2020   Procedure: CATARACT EXTRACTION PHACO AND INTRAOCULAR LENS PLACEMENT (IOC) RIGHT DIABETIC 8.90 00:52.9;  Surgeon: Galen Manila, MD;  Location: [facility];  Service: Ophthalmology;  Laterality: Right;  Diabetic   CHOLECYSTECTOMY     EYE SURGERY  1964, 1966, and 1967   bilateral   FOOT SURGERY Right    cellulitis   PACEMAKER INSERTION     PPM GENERATOR CHANGEOUT N/A 02/24/2020   Procedure: PPM GENERATOR CHANGEOUT;  Surgeon: Marcina Millard, MD;  Location: [facility];  Service: Cardiovascular;  Laterality: N/A;   SPINE SURGERY     cyst removed; [facility]   Family History  Problem Relation Age of Onset   Breast cancer Maternal Aunt 39   Cancer Mother        colon   Aneurysm Father    Social History   Tobacco Use   Smoking status: Every Day    Packs/day: 1.00    Years: 50.00    Pack years: 50.00    Types: Cigarettes   Smokeless tobacco: Never  Substance Use Topics   Alcohol use: No   Allergies  Allergen Reactions   Alprazolam Swelling   Fentanyl Other (See Comments)    "burning and hot"   Meperidine Itching    Other reaction(s): Other (See Comments)   Meperidine Hcl    Ranitidine Hcl     Other reaction(s): Other (See Comments)  Prior to Admission medications   Medication Sig Start Date End Date Taking? Authorizing Provider  acetaminophen (TYLENOL) 500 MG tablet Take 500 mg by mouth every 6 (six) hours as needed for mild pain or moderate pain.   Yes [provider]  amLODipine (NORVASC) 10 MG tablet TAKE 1 TABLET BY MOUTH EVERY DAY 08/28/16  Yes Gabriel Cirri, NP  ASPIRIN LOW DOSE 81 MG chewable tablet Chew 81 mg by mouth daily. 07/27/20  Yes [provider]  benztropine (COGENTIN) 0.5 MG tablet Take 0.5 mg by mouth 2 (two) times daily.   Yes [provider]  brexpiprazole (REXULTI) 2 MG TABS tablet Take 1 mg by mouth at bedtime.   Yes [provider]  calcitRIOL (ROCALTROL) 0.25  MCG capsule Take 0.25 mcg by mouth daily.   Yes [provider]  carbidopa-levodopa (SINEMET IR) 25-100 MG tablet Take 1 tablet by mouth 3 (three) times daily. 07/27/20  Yes [provider]  cholecalciferol (VITAMIN D3) 25 MCG (1000 UNIT) tablet Take 400 Units by mouth daily.    Yes [provider]  citalopram (CELEXA) 20 MG tablet Take 20 mg by mouth daily.   Yes [provider]  fenofibrate (TRICOR) 48 MG tablet Take 48 mg by mouth daily.   Yes [provider]  fluticasone furoate-vilanterol (BREO ELLIPTA) 200-25 MCG/INH AEPB Inhale 1 puff into the lungs daily. 08/31/20  Yes Marrion Coy, MD  guaiFENesin (ROBITUSSIN) 100 MG/5ML liquid Take 5 mLs by mouth every 6 (six) hours as needed for cough or to loosen phlegm.   Yes [provider]  hydroxypropyl methylcellulose / hypromellose (ISOPTO TEARS / GONIOVISC) 2.5 % ophthalmic solution Place 1 drop into the right eye 4 (four) times daily.   Yes [provider]  ILEVRO 0.3 % ophthalmic suspension Place 1 drop into the right eye daily. 12/04/20  Yes [provider]  levothyroxine (SYNTHROID) 150 MCG tablet Take 175 mcg by mouth daily. 07/27/20  Yes [provider]  loperamide (IMODIUM A-D) 2 MG tablet Take 2 mg by mouth 4 (four) times daily as needed for diarrhea or loose stools.   Yes [provider]  magnesium oxide (MAG-OX) 400 MG tablet Take 400 mg by mouth daily.   Yes [provider]  metoprolol tartrate (LOPRESSOR) 25 MG tablet Take 25 mg by mouth 2 (two) times daily.   Yes [provider]  nitroGLYCERIN (NITRODUR - DOSED IN MG/24 HR) 0.2 mg/hr patch Place 0.2 mg onto the skin daily. leave patch on 12-14 hours, then remove for 10-12 hours prior to applying the next patch   Yes [provider]  pantoprazole (PROTONIX) 40 MG tablet Take 40 mg by mouth daily.   Yes [provider]  primidone (MYSOLINE) 50 MG tablet Take 50 mg by  mouth 2 (two) times daily.   Yes [provider]  senna-docusate (SENOKOT-S) 8.6-50 MG tablet Take 2 tablets by mouth 2 (two) times daily.   Yes [provider]  traZODone (DESYREL) 50 MG tablet Take by mouth at bedtime as needed for sleep.   Yes [provider]  amLODipine (NORVASC) 5 MG tablet Take 5 mg by mouth daily. Patient not taking: Reported on 03/24/2021    [provider]  DUREZOL 0.05 % EMUL Apply 1 drop to eye 2 (two) times daily. Patient not taking: Reported on 03/24/2021 07/28/20   [provider]  erythromycin ophthalmic ointment Apply a thin strip of ointment (about 1/4") to the lower eyelid of  each affected eye every 6 hours x 1 week. Patient not taking: Reported on 03/24/2021 01/08/21   Sharyn Creamer, MD  fluticasone [facility]) 50 MCG/ACT nasal spray Place 2 sprays into both nostrils daily. Patient not taking: Reported on 03/24/2021    [provider]  gabapentin (NEURONTIN) 100 MG capsule Take 100 mg by mouth at bedtime. Patient not taking: Reported on 03/24/2021    [provider]  Lactobacillus Acid-Pectin (ACIDOPHILUS/PECTIN) CAPS Take 1 capsule by mouth in the morning and at bedtime. 07/27/20   [provider]  losartan (COZAAR) 50 MG tablet Take 50 mg by mouth daily. Patient not taking: Reported on 03/24/2021    [provider]  metFORMIN (GLUCOPHAGE) 1000 MG tablet Take 0.5 tablets (500 mg total) by mouth 2 (two) times daily with a meal. 05/28/19   Dhungel, Nishant, MD  montelukast (SINGULAIR) 10 MG tablet Take 10 mg by mouth daily. Patient not taking: Reported on 03/24/2021    [provider]  primidone (MYSOLINE) 50 MG tablet Take by mouth 4 (four) times daily. Patient not taking: Reported on 03/24/2021    [provider]  sitaGLIPtin (JANUVIA) 50 MG tablet Take 50 mg by mouth daily. Patient not taking: Reported on 03/24/2021    [provider]  torsemide (DEMADEX) 20 MG  tablet Take 1 tablet (20 mg total) by mouth daily. Patient not taking: Reported on 03/24/2021 08/30/20   Marrion Coy, MD  traMADol (ULTRAM) 50 MG tablet Take 1 tablet (50 mg total) by mouth every 8 (eight) hours as needed for moderate pain (left shoulder). Patient not taking: Reported on 03/24/2021 01/08/21   Sharyn Creamer, MD  vitamin B-12 (CYANOCOBALAMIN) 1000 MCG tablet Take 2,000 mcg by mouth daily. Patient not taking: Reported on 03/24/2021    [provider]   Review of Systems  Constitutional:  Positive for fatigue (easily). Negative for appetite change.  HENT:  Positive for hearing loss. Negative for congestion and postnasal drip.   Eyes: Negative.   Respiratory:  Negative for cough, chest tightness and shortness of breath.   Cardiovascular:  Negative for chest pain, palpitations and leg swelling.  Gastrointestinal:  Negative for abdominal distention and abdominal pain.  Endocrine: Negative.   Genitourinary: Negative.   Musculoskeletal:  Positive for back pain. Negative for neck pain.  Skin: Negative.   Allergic/Immunologic: Negative.   Neurological:  Positive for dizziness and weakness (in legs). Negative for light-headedness.  Hematological:  Negative for adenopathy. Does not bruise/bleed easily.  Psychiatric/Behavioral:  Positive for dysphoric mood and sleep disturbance (sleeping during the day). The patient is nervous/anxious.    Vitals:   03/24/21 1233  BP: (!) 153/78  Pulse: 71  Resp: 18  SpO2: 91%  Weight: 135 lb 2 oz (61.3 kg)  Height: 5\' 1"  (1.549 m)   Wt Readings from Last 3 Encounters:  03/24/21 135 lb 2 oz (61.3 kg)  03/04/21 127 lb (57.6 kg)  02/16/21 127 lb 13.9 oz (58 kg)   Lab Results  Component Value Date   CREATININE 1.99 (H) 02/13/2021   CREATININE 1.75 (H) 02/11/2021   CREATININE 2.70 (H) 01/27/2021   Physical Exam Vitals and nursing note reviewed. Exam conducted with a chaperone present (staff from 01/29/2021 at [facility]).  Constitutional:       Appearance: Melanie King is well-developed.  HENT:     Head: Normocephalic and atraumatic.     Right Ear: Decreased hearing noted.     Left Ear: Decreased hearing noted.  Cardiovascular:  Rate and Rhythm: Normal rate and regular rhythm.  Pulmonary:     Effort: Pulmonary effort is normal. No respiratory distress.     Breath sounds: No wheezing or rales.  Abdominal:     General: Abdomen is flat. There is no distension.     Palpations: Abdomen is soft.  Musculoskeletal:        General: No tenderness. Normal range of motion.     Cervical back: Normal range of motion and neck supple.     Right lower leg: No edema.     Left lower leg: No edema.  Skin:    General: Skin is warm and dry.  Neurological:     General: No focal deficit present.     Mental Status: Melanie King is alert and oriented to person, place, and time.  Psychiatric:        Mood and Affect: Mood normal.        Behavior: Behavior normal.        Thought Content: Thought content normal.   Assessment & Plan:  1: Chronic heart failure with preserved ejection fraction without structural changes- - NYHA class III - euvolemic today - getting weighed daily; reminded to call for an overnight weight gain of >2 pounds or a weekly weight gain of >5 pounds - weight down 13 pounds from last visit here 6 months ago - not adding salt and is currently on a heart healthy diet at the facility - saw cardiology Cliffton Asters) 01/10/21 - has had numerous falls resulting in ED visits; reminded her to not get up on her own - BNP 08/26/20 was 659.2  2: HTN- - BP mildly elevated (153/78) - saw PCP ([facility]) 05/06/20; now sees PCP at facility - [facility] 02/13/21 reviewed and showed sodium 134, potassium 4.2, creatinine 1.99 and GFR 26  3: DM- - saw endocrinology Rosaura Carpenter) 06/04/19 - A1c 08/27/20 was 7.8%    Facility medication list was reviewed.   Return in 6 months, sooner if needed.

## 2021-03-24 NOTE — Patient Instructions (Addendum)
Continue weighing daily and call for an overnight weight gain of 3 pounds or more or a weekly weight gain of more than 5 pounds.  ° °[facility] will be moving around the corner to [id number] mid-February. Our phone number will remain the same ° °

## 2021-04-10 IMAGING — CT CT CERVICAL SPINE W/O CM
2 series · 11 of 27 positions shown, 14 images · non-contrast
Comparison: CT head and C-spine 12/30/2019

CLINICAL DATA: Pt states was going to sit down and missed her seat,
pt states fell back and hit her head, pt with small lac to L
posterior head, bleeding controlled, denies LOC, denies blood
thinner use at this time.

EXAM:
CT HEAD WITHOUT CONTRAST
CT CERVICAL SPINE WITHOUT CONTRAST
TECHNIQUE: Multidetector CT imaging of the head and cervical spine was
performed following the standard protocol without intravenous
contrast. Multiplanar CT image reconstructions of the cervical spine
were also generated.

[Series 3: c spine soft · axial · 0.41mm/px · z∈[-264,-158]mm · 6 of 70 slices shown, 8 images]
[im 11/70  soft-tissue]
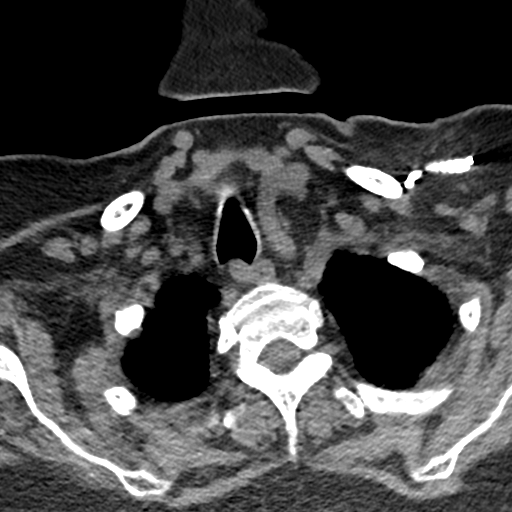
[im 11/70  bone]
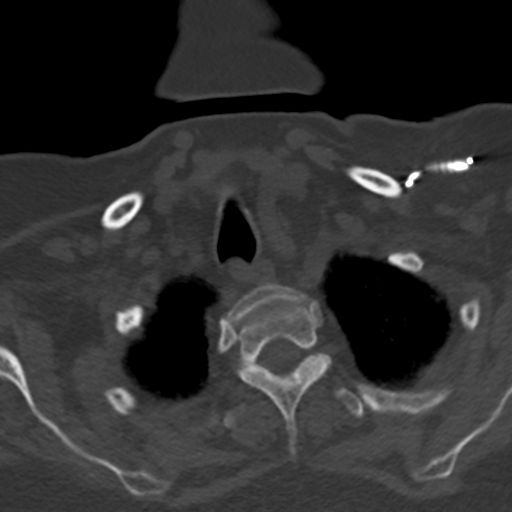
[im 22/70  bone]
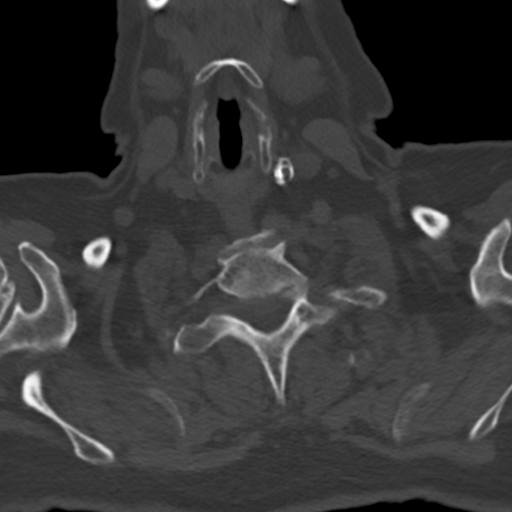
[im 32/70  bone]
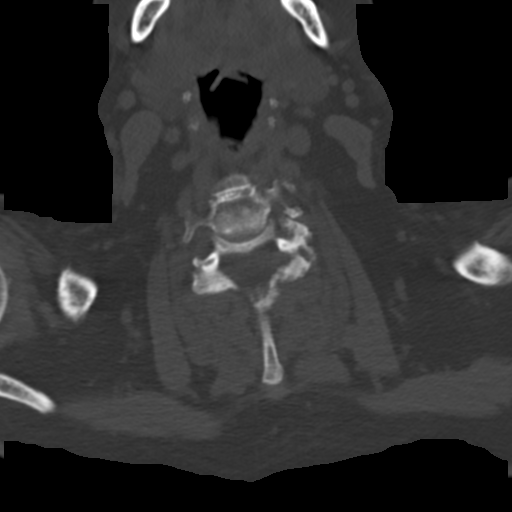
[im 43/70  bone]
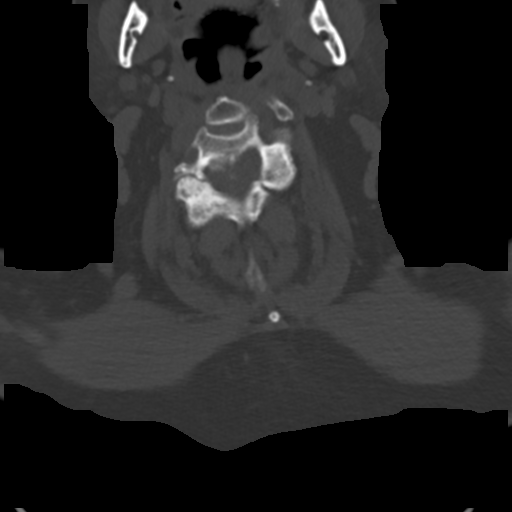
[im 54/70  soft-tissue]
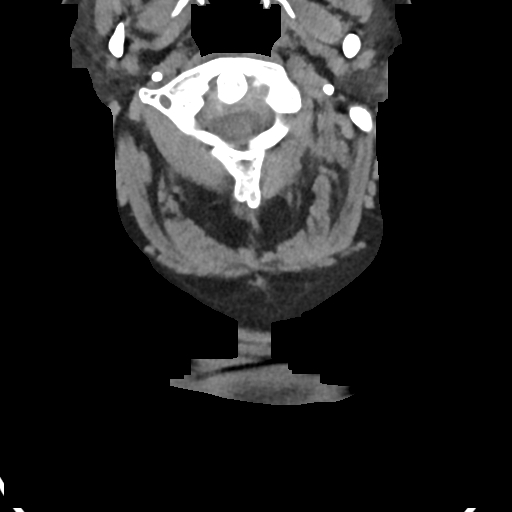
[im 54/70  bone]
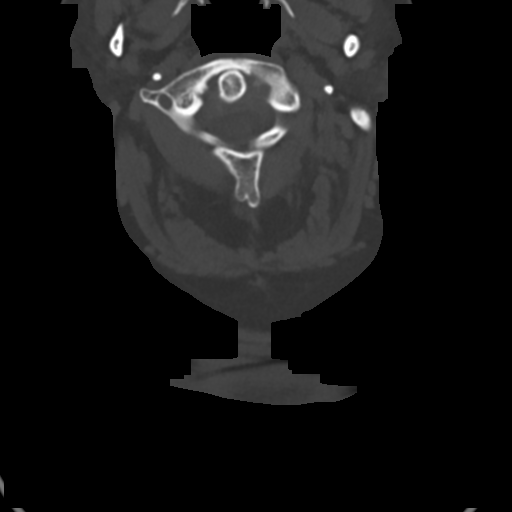
[im 64/70  bone]
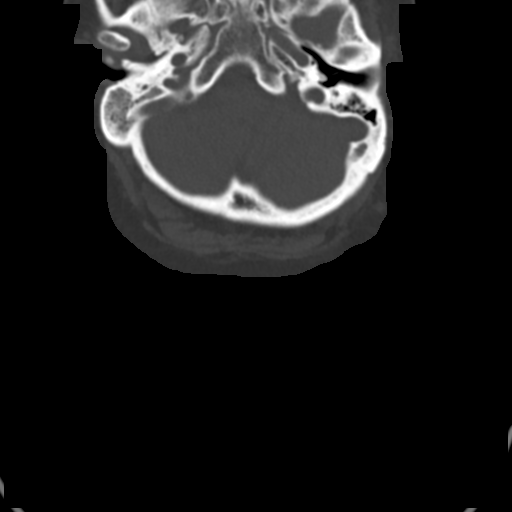

[Series 4: sagittal bone · sagittal · 0.22mm/px · 5 of 64 slices shown, 6 images]
[im 22/64  bone]
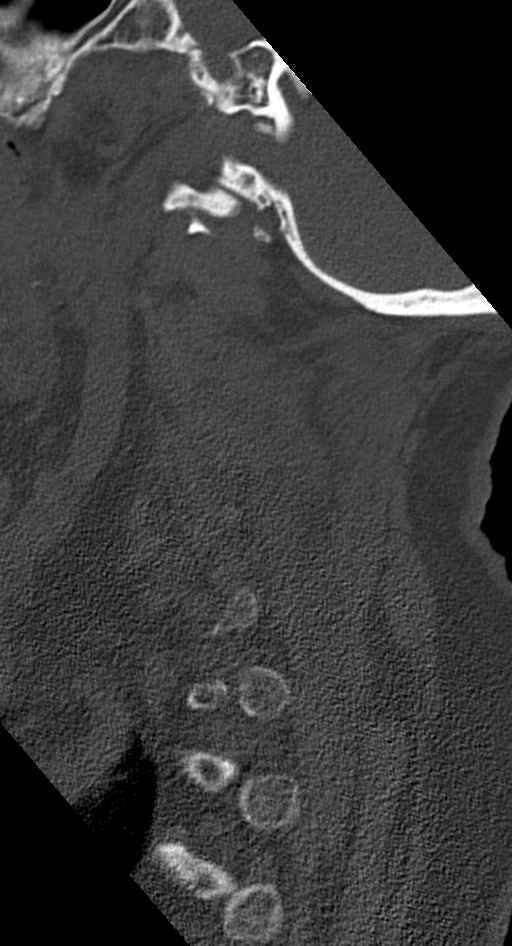
[im 27/64  bone]
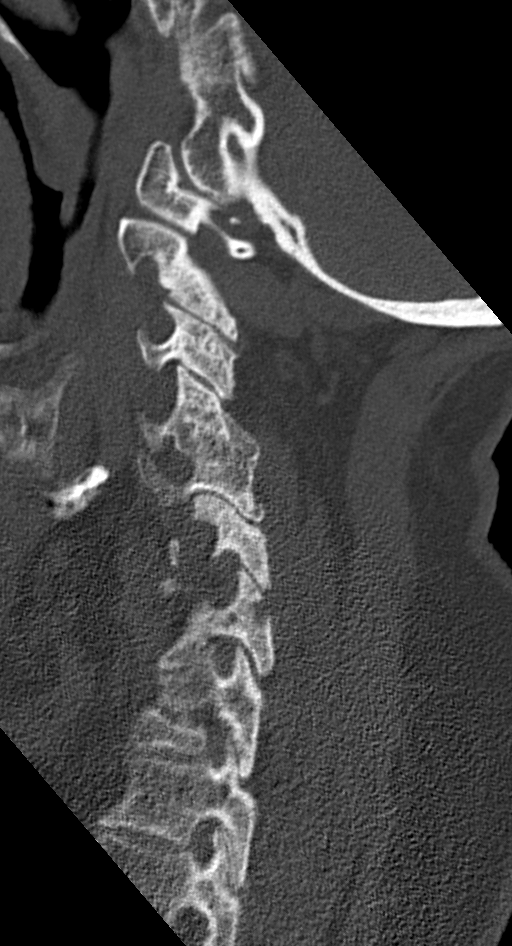
[im 32/64  soft-tissue]
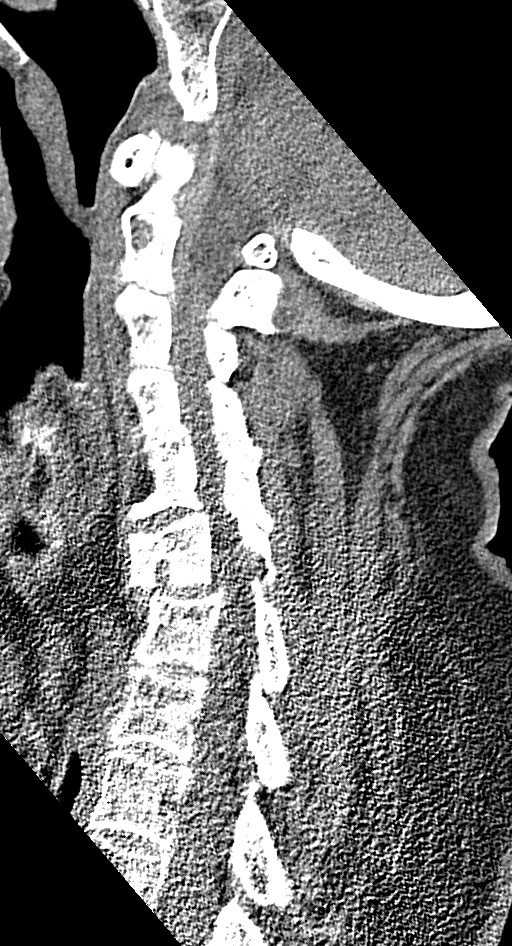
[im 32/64  bone]
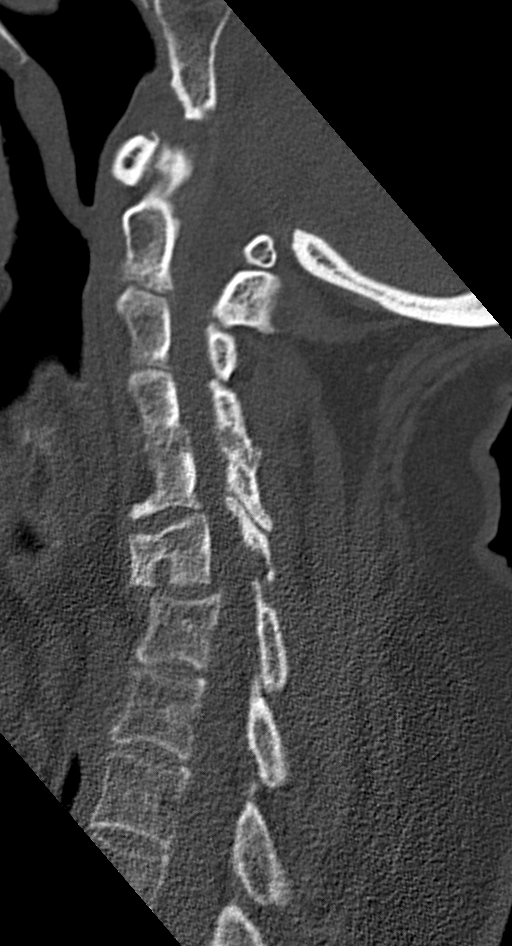
[im 37/64  bone]
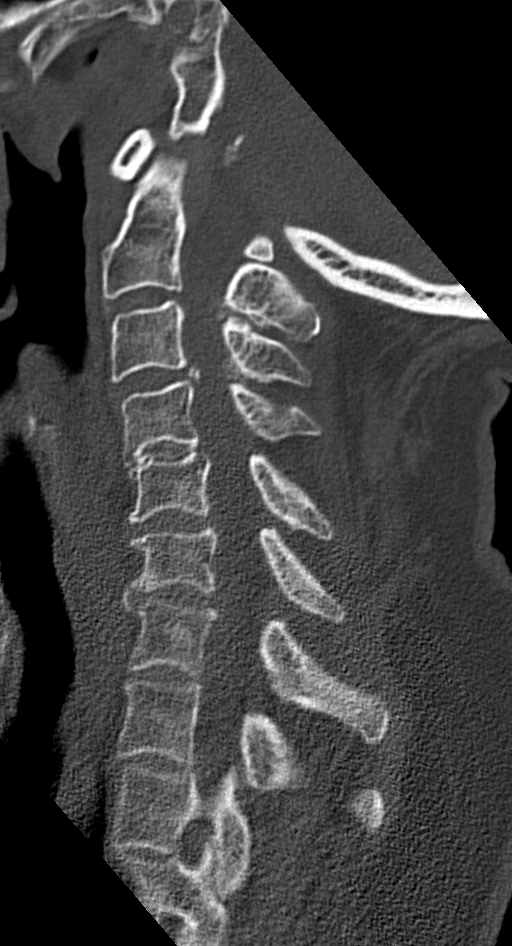
[im 43/64  bone]
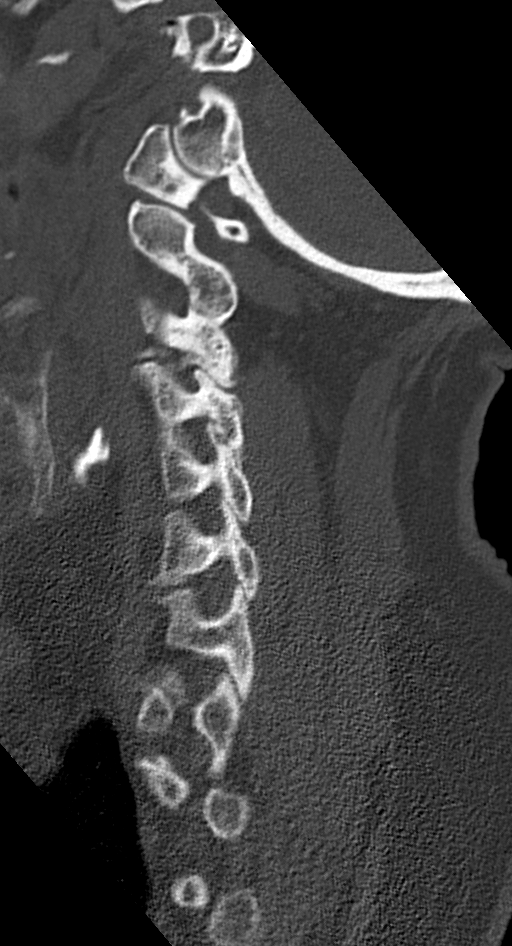

[11 of 27 positions shown; findings below may reference images not displayed]

FINDINGS: CT HEAD FINDINGS

Brain:

Cerebral ventricle sizes are concordant with the degree of cerebral
volume loss. Patchy and confluent areas of decreased attenuation are
noted throughout the deep and periventricular white matter of the
cerebral hemispheres bilaterally, compatible with chronic
microvascular ischemic disease. Bilateral basal ganglia old lacunar
infarctions.

No evidence of large-territorial acute infarction. No parenchymal
hemorrhage. No mass lesion. No extra-axial collection.

No mass effect or midline shift. No hydrocephalus. Basilar cisterns
are patent.

Vascular: No hyperdense vessel. Atherosclerotic calcifications are
present within the cavernous internal carotid and vertebral
arteries.

Skull: No acute fracture or focal lesion.

Sinuses/Orbits: Paranasal sinuses and mastoid air cells are clear.
The orbits are unremarkable.

Other: None.

CT CERVICAL SPINE FINDINGS

Alignment: Similar-appearing reversal of the normal cervical
lordosis likely due to degenerative changes and positioning. Stable
grade 1 anterolisthesis of C4 on C5.

Skull base and vertebrae: Similar-appearing multilevel at least
moderate degenerative changes of the spine with osteophyte
formation, facet arthropathy, uncovertebral arthropathy. At least
moderate multilevel osseous neural foraminal stenosis. No acute
fracture. No aggressive appearing focal osseous lesion or focal
pathologic process.

Soft tissues and spinal canal: No prevertebral fluid or swelling. No
visible canal hematoma.

Upper chest: Unremarkable.

Other: Partially visualized left pacemaker.
IMPRESSION: 1. No acute intracranial abnormality.
2. No acute displaced fracture or traumatic listhesis of the
cervical spine.

## 2021-04-10 IMAGING — CT CT HEAD W/O CM
3 of 4 series · 13 of 47 positions shown, 15 images · non-contrast
Comparison: CT head and C-spine 12/30/2019

CLINICAL DATA: Pt states was going to sit down and missed her seat,
pt states fell back and hit her head, pt with small lac to L
posterior head, bleeding controlled, denies LOC, denies blood
thinner use at this time.

EXAM:
CT HEAD WITHOUT CONTRAST
CT CERVICAL SPINE WITHOUT CONTRAST
TECHNIQUE: Multidetector CT imaging of the head and cervical spine was
performed following the standard protocol without intravenous
contrast. Multiplanar CT image reconstructions of the cervical spine
were also generated.

[Series 3: coronal soft tissue · coronal · 0.30mm/px · 3 of 65 slices shown]
[im 22/65  brain]
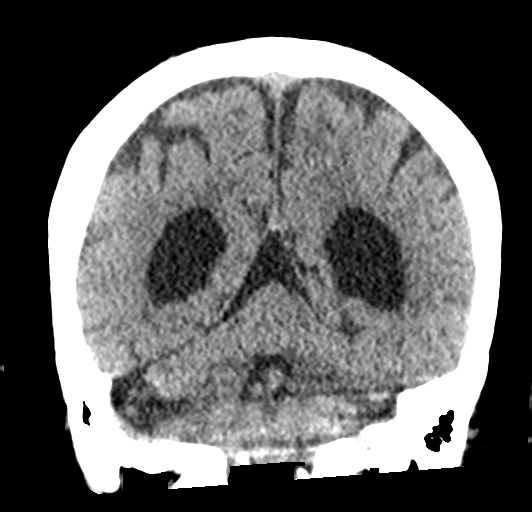
[im 29/65  brain]
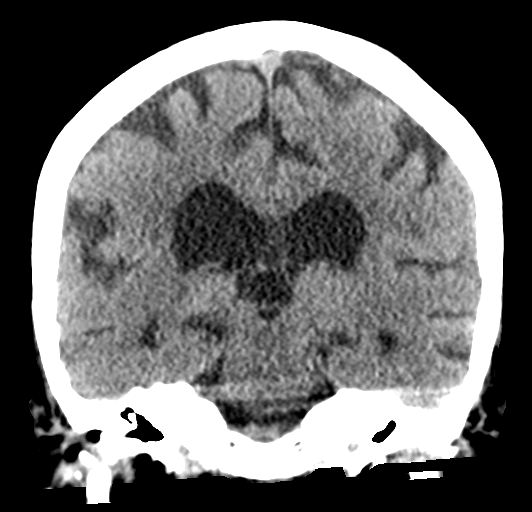
[im 36/65  brain]
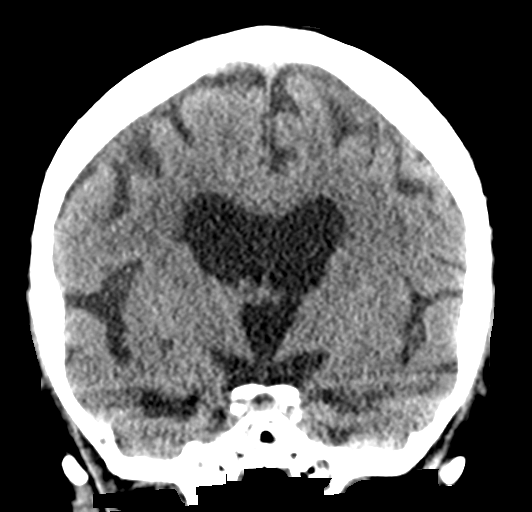

[Series 4: sagittal soft tissue · sagittal · 0.30mm/px · 3 of 54 slices shown]
[im 18/54  brain]
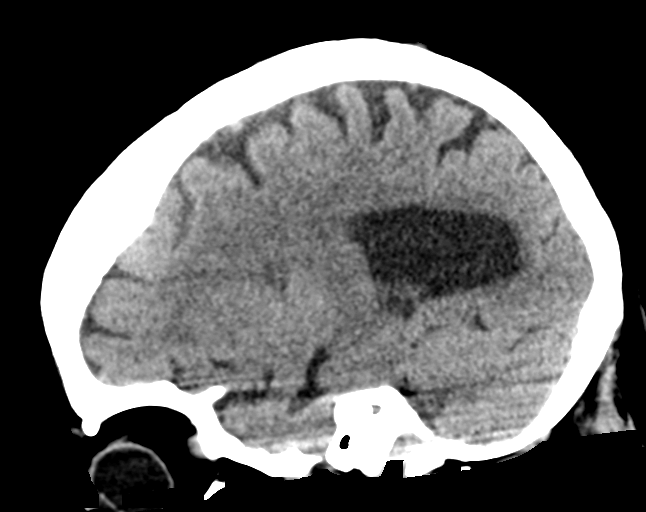
[im 27/54  brain]
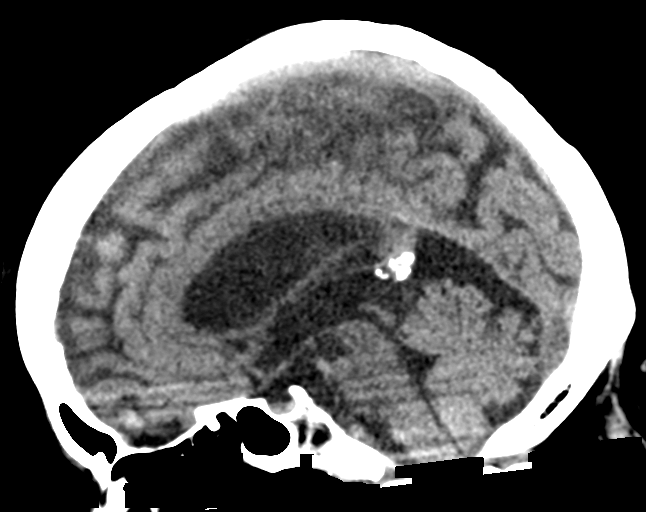
[im 36/54  brain]
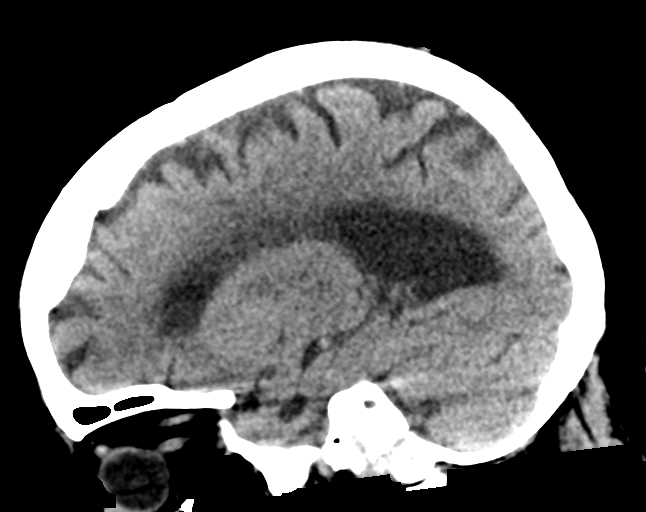

[Series 5: ax head wo · axial · 0.31mm/px · z∈[-135,-23]mm · 7 of 31 slices shown, 9 images]
[im 4/31  brain]
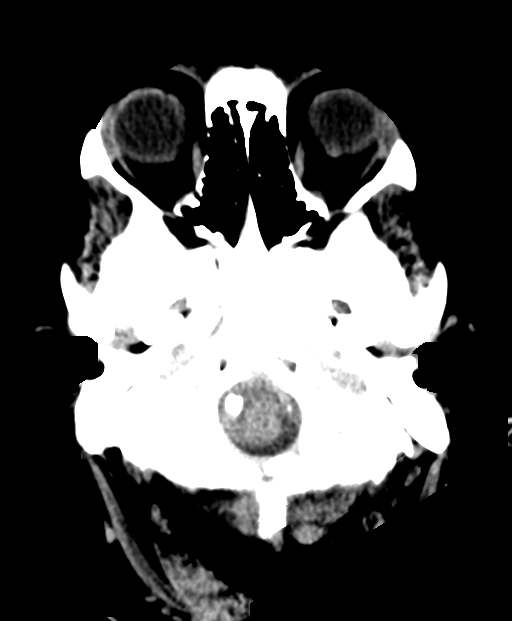
[im 4/31  bone]
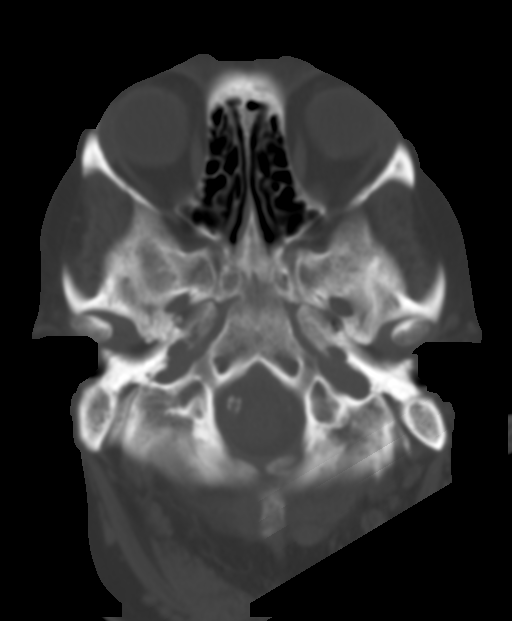
[im 8/31  brain]
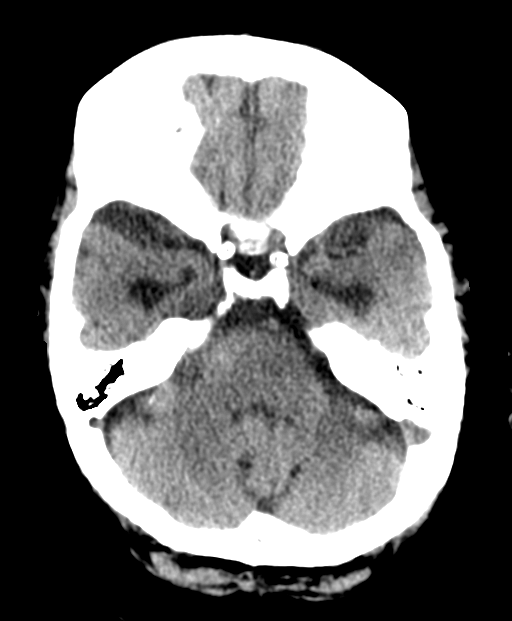
[im 12/31  brain]
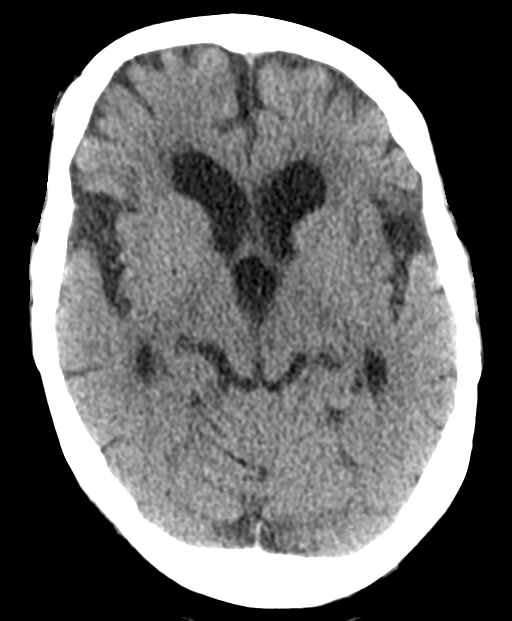
[im 16/31  brain]
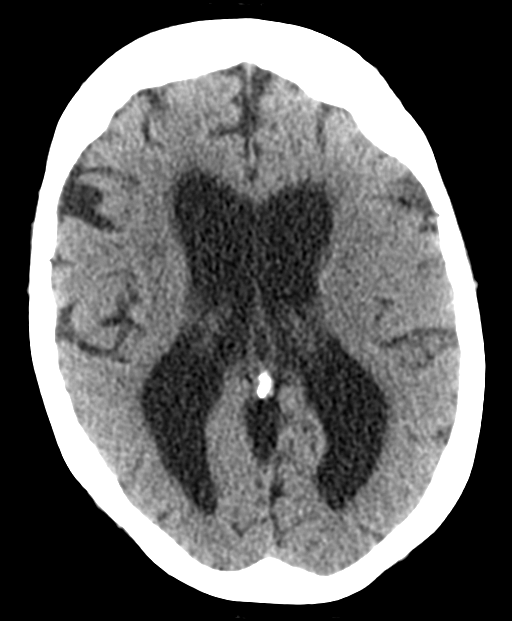
[im 19/31  brain]
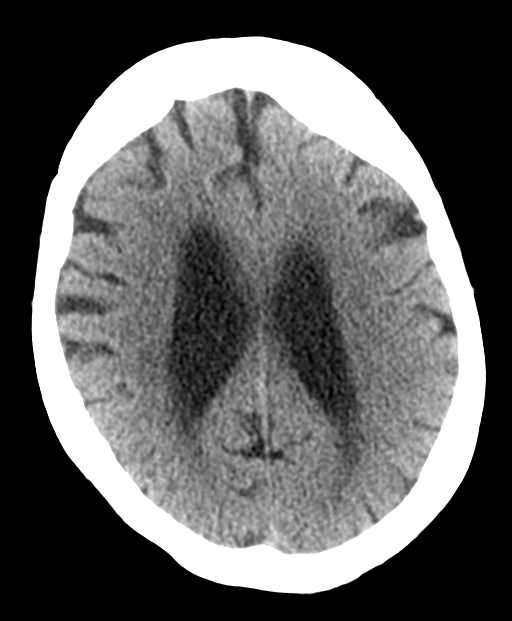
[im 19/31  bone]
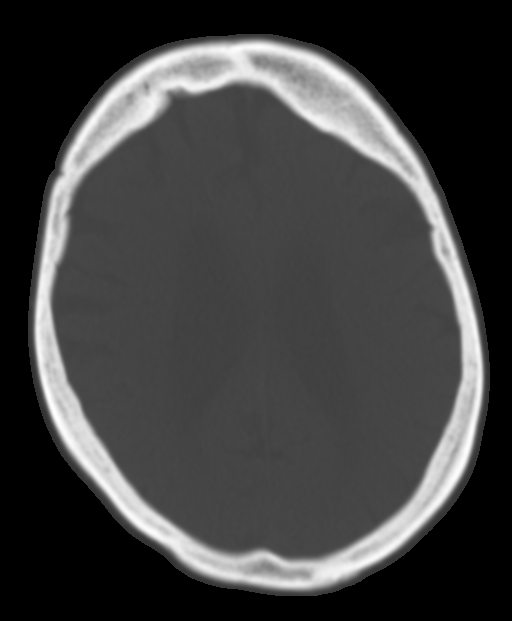
[im 23/31  brain]
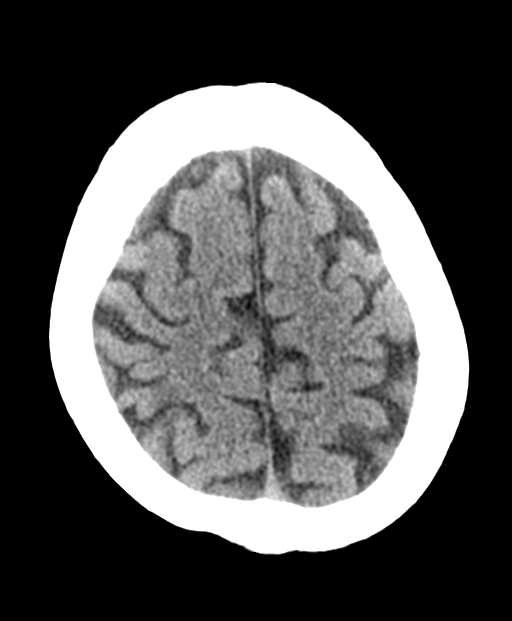
[im 27/31  brain]
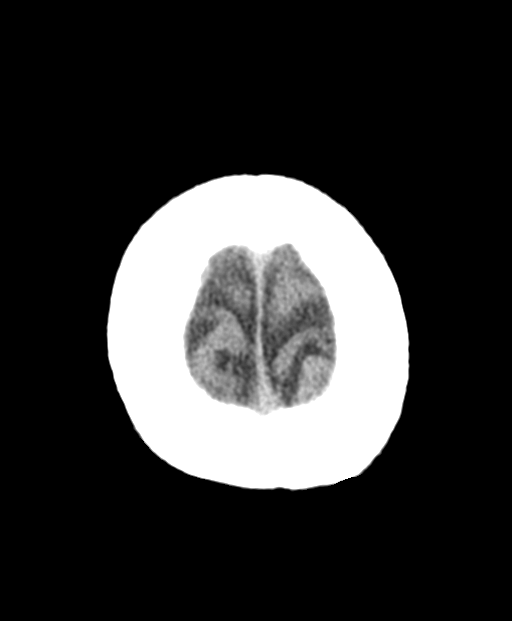

[13 of 47 positions shown; findings below may reference images not displayed]

FINDINGS: CT HEAD FINDINGS

Brain:

Cerebral ventricle sizes are concordant with the degree of cerebral
volume loss. Patchy and confluent areas of decreased attenuation are
noted throughout the deep and periventricular white matter of the
cerebral hemispheres bilaterally, compatible with chronic
microvascular ischemic disease. Bilateral basal ganglia old lacunar
infarctions.

No evidence of large-territorial acute infarction. No parenchymal
hemorrhage. No mass lesion. No extra-axial collection.

No mass effect or midline shift. No hydrocephalus. Basilar cisterns
are patent.

Vascular: No hyperdense vessel. Atherosclerotic calcifications are
present within the cavernous internal carotid and vertebral
arteries.

Skull: No acute fracture or focal lesion.

Sinuses/Orbits: Paranasal sinuses and mastoid air cells are clear.
The orbits are unremarkable.

Other: None.

CT CERVICAL SPINE FINDINGS

Alignment: Similar-appearing reversal of the normal cervical
lordosis likely due to degenerative changes and positioning. Stable
grade 1 anterolisthesis of C4 on C5.

Skull base and vertebrae: Similar-appearing multilevel at least
moderate degenerative changes of the spine with osteophyte
formation, facet arthropathy, uncovertebral arthropathy. At least
moderate multilevel osseous neural foraminal stenosis. No acute
fracture. No aggressive appearing focal osseous lesion or focal
pathologic process.

Soft tissues and spinal canal: No prevertebral fluid or swelling. No
visible canal hematoma.

Upper chest: Unremarkable.

Other: Partially visualized left pacemaker.
IMPRESSION: 1. No acute intracranial abnormality.
2. No acute displaced fracture or traumatic listhesis of the
cervical spine.

## 2021-07-14 IMAGING — CT CT HEAD W/O CM
4 of 5 series · 15 of 47 positions shown, 17 images · non-contrast
Comparison: 05/02/2020

CLINICAL DATA: Fell at [REDACTED], this was standing,
became dizzy and fell backwards striking back of head. Denies loss
of consciousness. History CHF, COPD, coronary artery disease,
diabetes mellitus, hypertension

EXAM:
CT HEAD WITHOUT CONTRAST
CT CERVICAL SPINE WITHOUT CONTRAST
TECHNIQUE: Multidetector CT imaging of the head and cervical spine was
performed following the standard protocol without intravenous
contrast. Multiplanar CT image reconstructions of the cervical spine
were also generated.

[Series 3: head wo · axial · 0.43mm/px · z∈[-145,-45]mm · 5 of 32 slices shown, 7 images]
[im 6/32  brain]
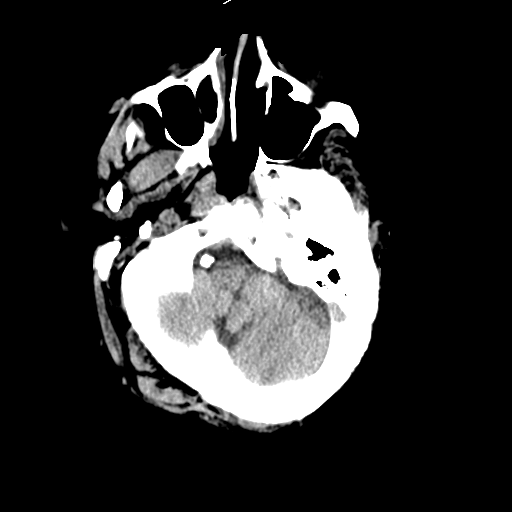
[im 6/32  bone]
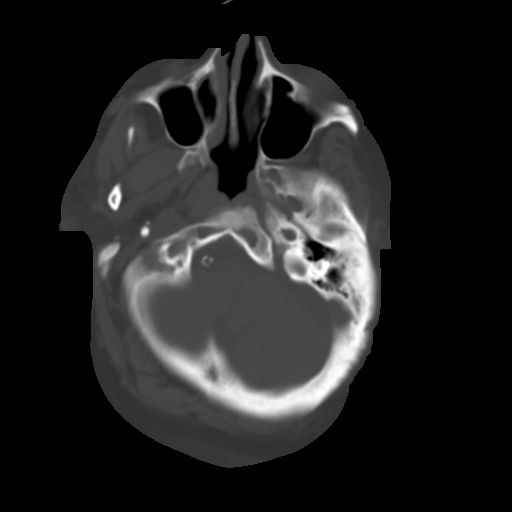
[im 11/32  brain]
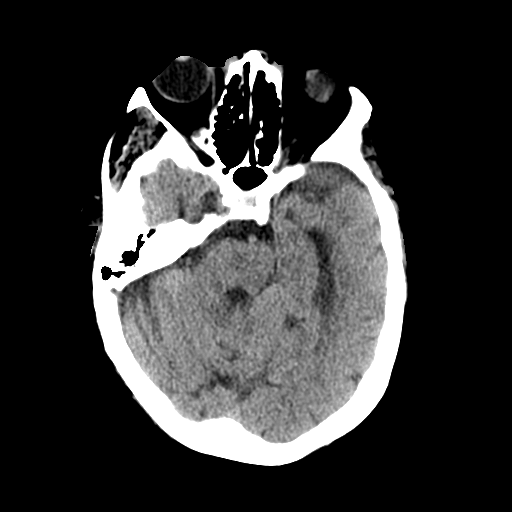
[im 16/32  brain]
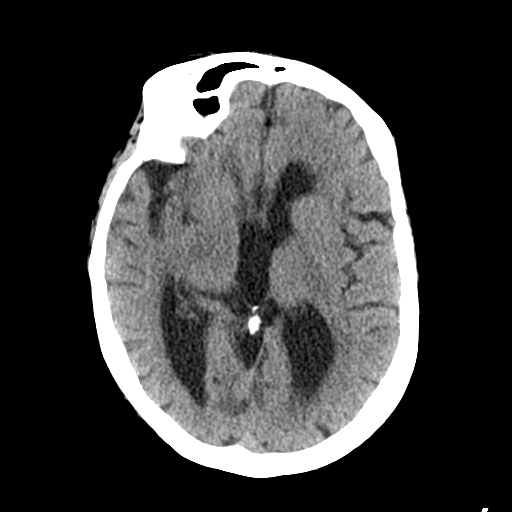
[im 21/32  brain]
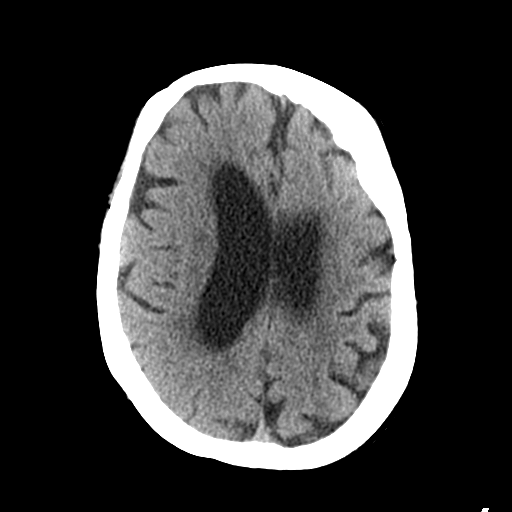
[im 26/32  brain]
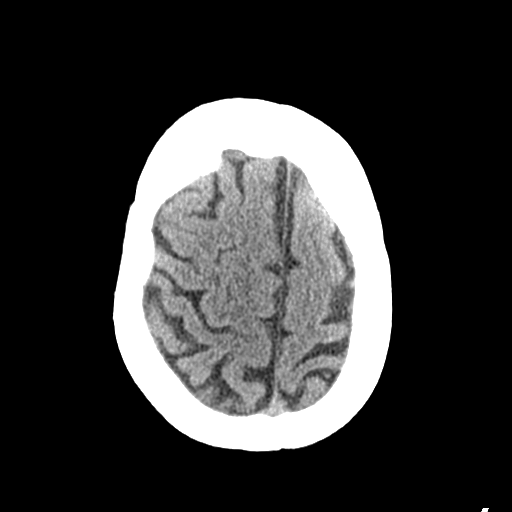
[im 26/32  bone]
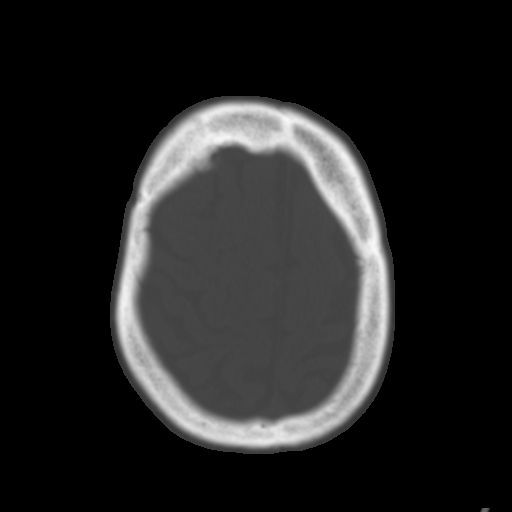

[Series 4: coronal soft tissue · coronal · 0.35mm/px · 3 of 74 slices shown]
[im 25/74  brain]
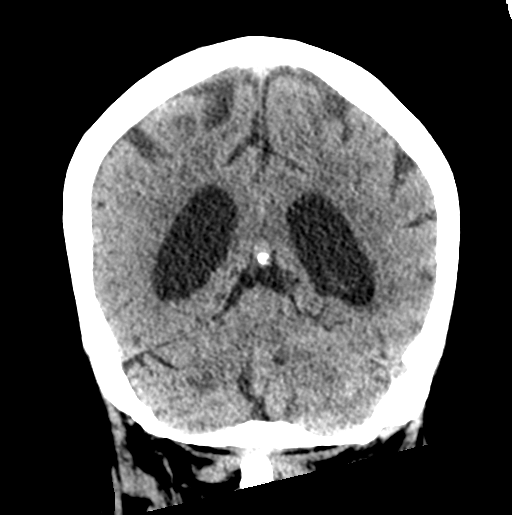
[im 33/74  brain]
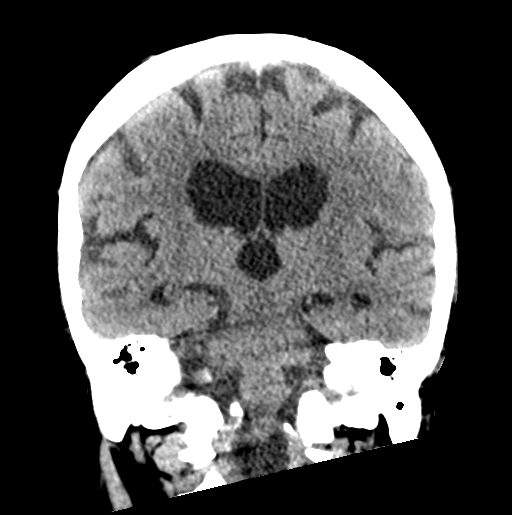
[im 41/74  brain]
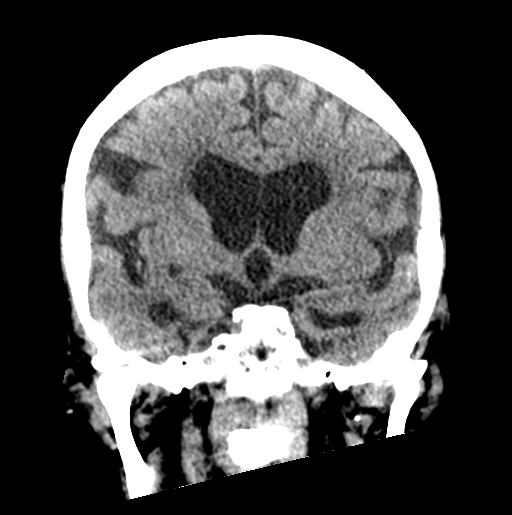

[Series 5: sagittal soft tissue · sagittal · 0.35mm/px · 3 of 51 slices shown]
[im 21/51  brain]
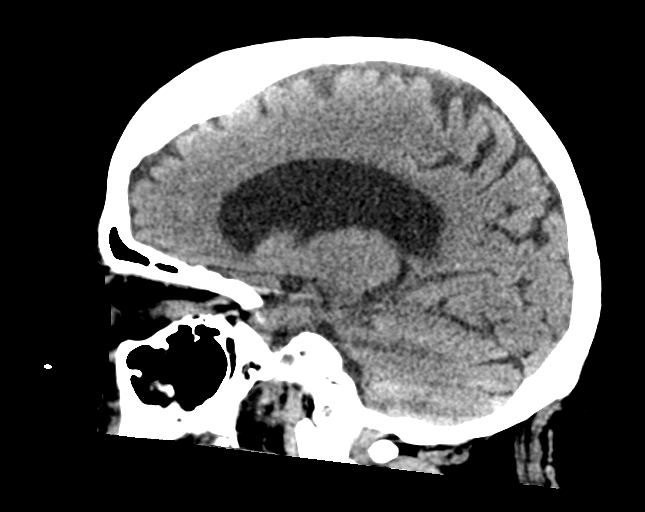
[im 26/51  brain]
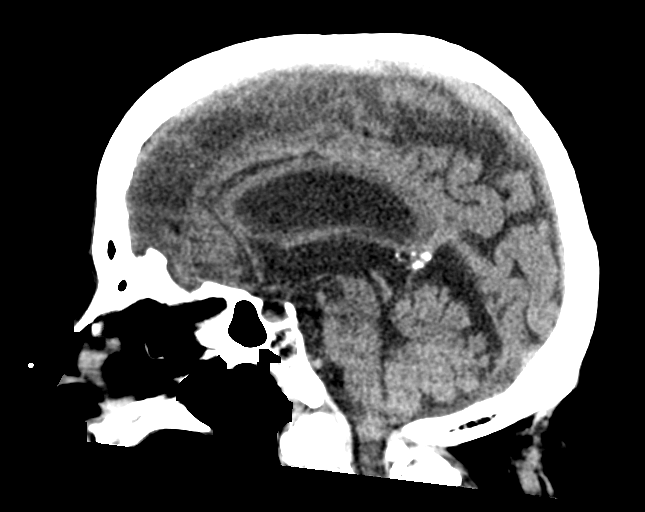
[im 30/51  brain]
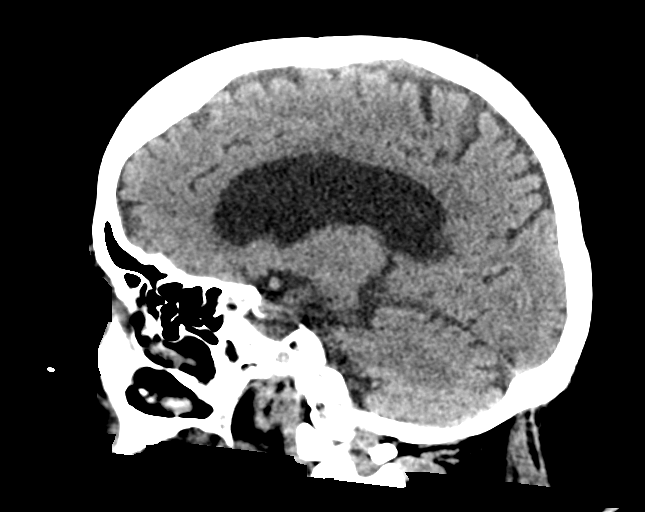

[Series 6: ax head wo recon · axial · 0.37mm/px · z∈[-117,-36]mm · 4 of 34 slices shown]
[im 6/34  brain]
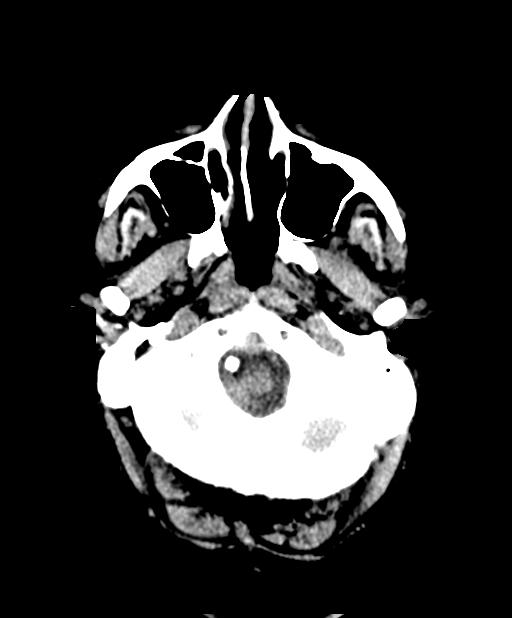
[im 12/34  brain]
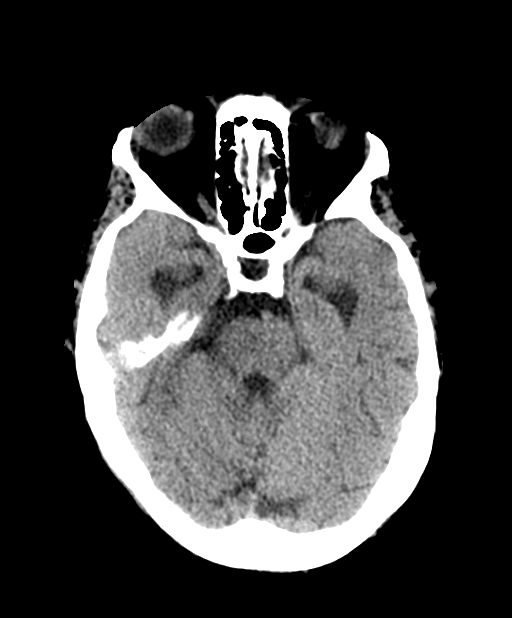
[im 17/34  brain]
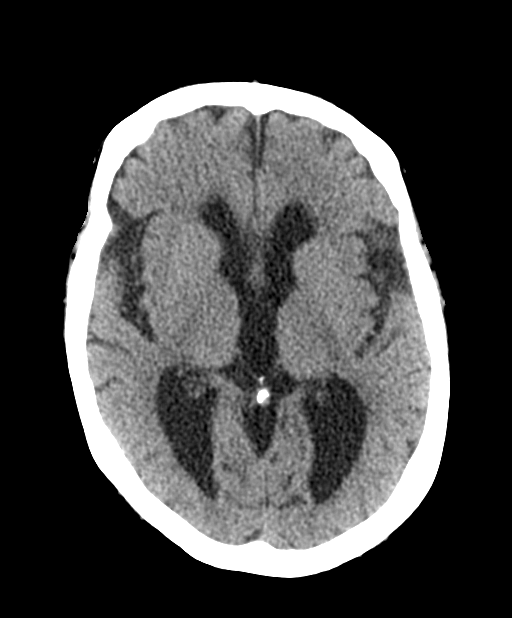
[im 23/34  brain]
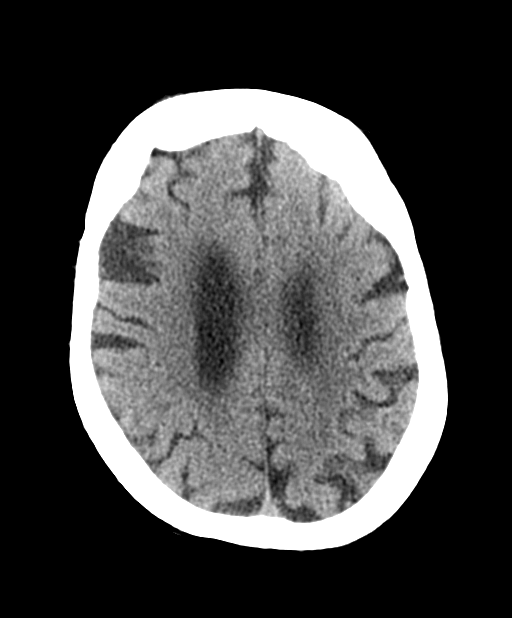

[15 of 47 positions shown; findings below may reference images not displayed]

FINDINGS: CT HEAD FINDINGS

Brain: Generalized atrophy. Ex vacuo dilatation of the ventricular
system. No midline shift or mass effect. Small vessel chronic
ischemic changes of deep cerebral white matter. Small old basal
ganglia lacunar infarcts. No intracranial hemorrhage, mass lesion or
evidence of acute infarction. No extra-axial fluid collections.

Vascular: No hyperdense vessels. Atherosclerotic calcification of
internal carotid and vertebral arteries at skull base.

Skull: Intact

Sinuses/Orbits: Clear

Other: N/A

CT CERVICAL SPINE FINDINGS

Alignment: Minimal anterolisthesis C4-C5 unchanged. Remaining
alignments normal.

Skull base and vertebrae: Osseous demineralization. Incomplete
posterior arch C1, developmental anomaly. Vertebral body heights
maintained. Scattered disc space narrowing and minimal endplate spur
formation. Multilevel facet degenerative changes. No definite
fracture, additional subluxation, or bone destruction. Lateral
cervical flexion to LEFT.

Soft tissues and spinal canal: Prevertebral soft tissues normal
thickness

Disc levels: Significant atherosclerotic calcifications at carotid
bifurcations with additional calcifications in proximal great
vessels.

Upper chest: Interseptal thickening at apices. Small pleural
effusion.

Other: N/A
IMPRESSION: Atrophy with small vessel chronic ischemic changes of deep cerebral
white matter.

Old basal ganglia lacunar infarcts.

No acute intracranial abnormalities.

Multilevel degenerative disc and facet disease changes of the
cervical spine.

No acute cervical spine abnormalities.

Significant atherosclerotic calcification at BILATERAL carotid
bifurcations; recommend correlation for risk factors for
cerebrovascular disease and consider carotid duplex sonography
assessment.

## 2021-07-14 IMAGING — CR DG LUMBAR SPINE 2-3V
3 series · 3 of 3 positions shown · non-contrast
Comparison: April 02, 2017.

CLINICAL DATA: Following fall

EXAM:
LUMBAR SPINE - 2-3 VIEW

[l-spine ap]
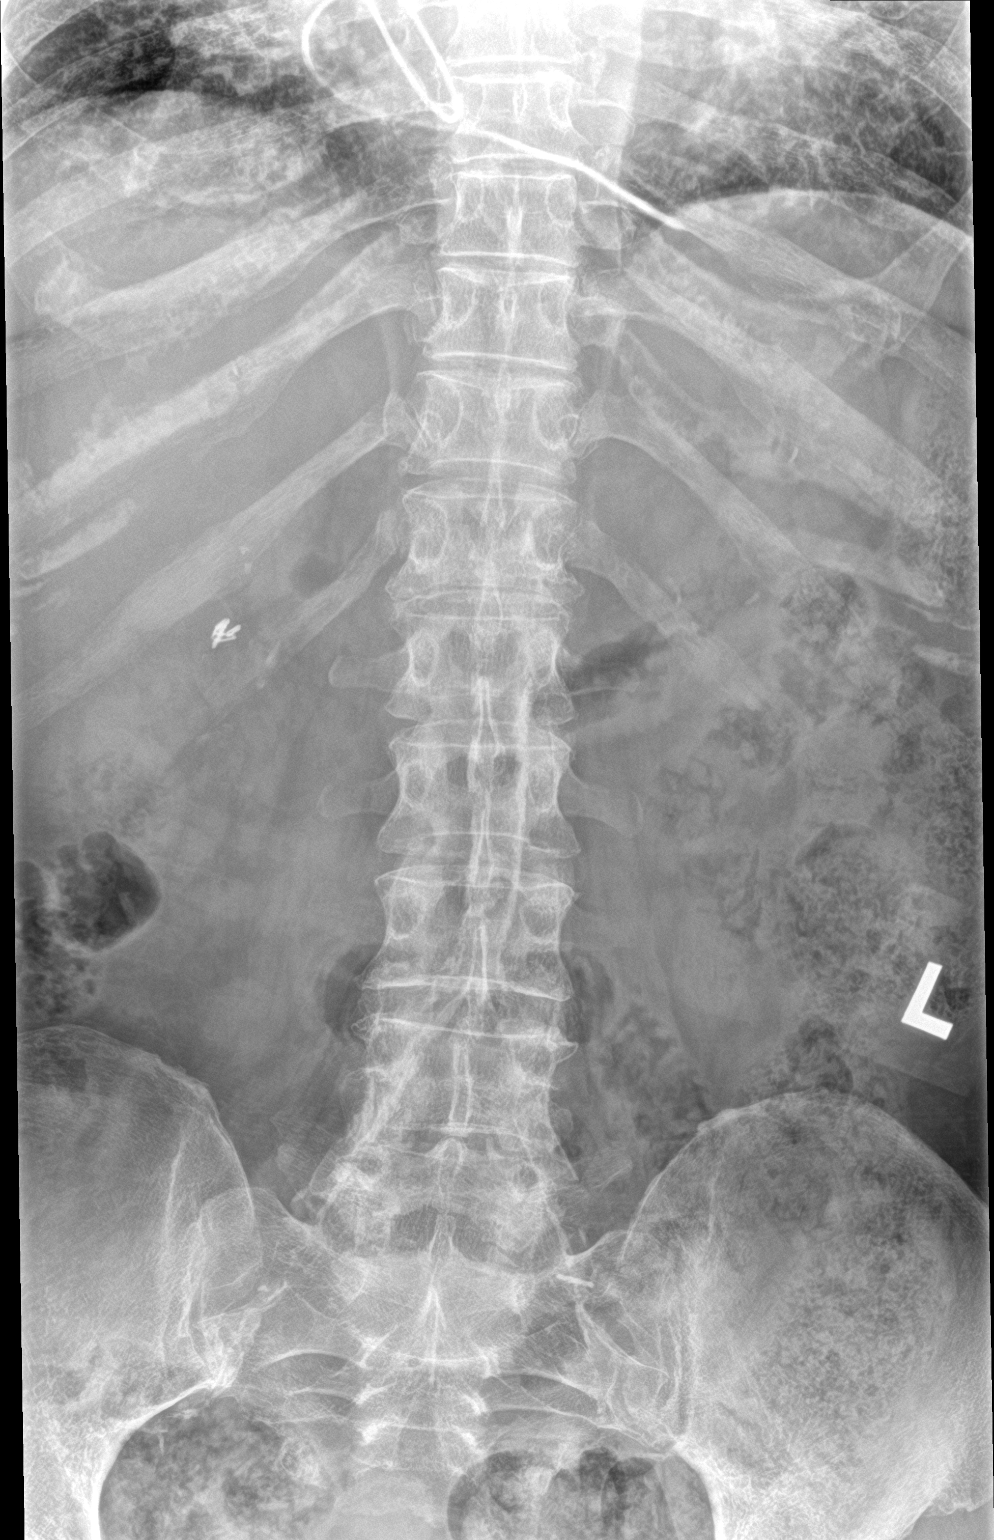

[l-spine lat]
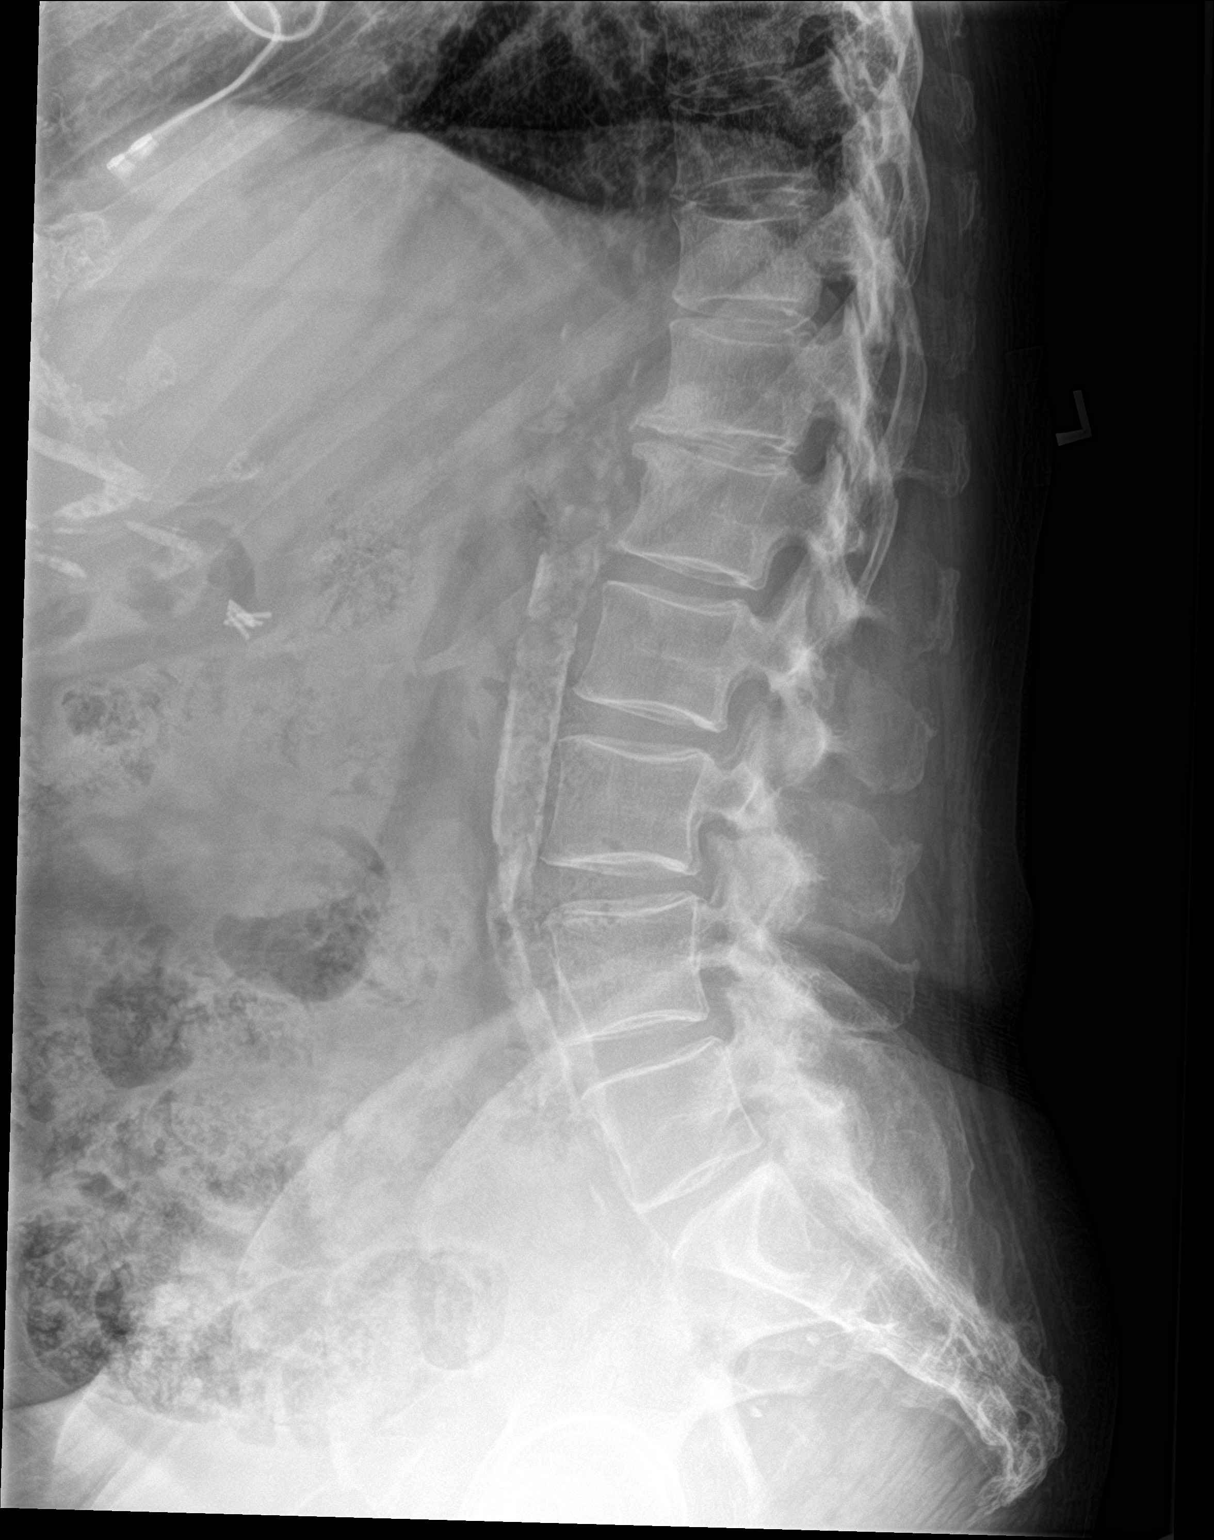

[l-spine spot]
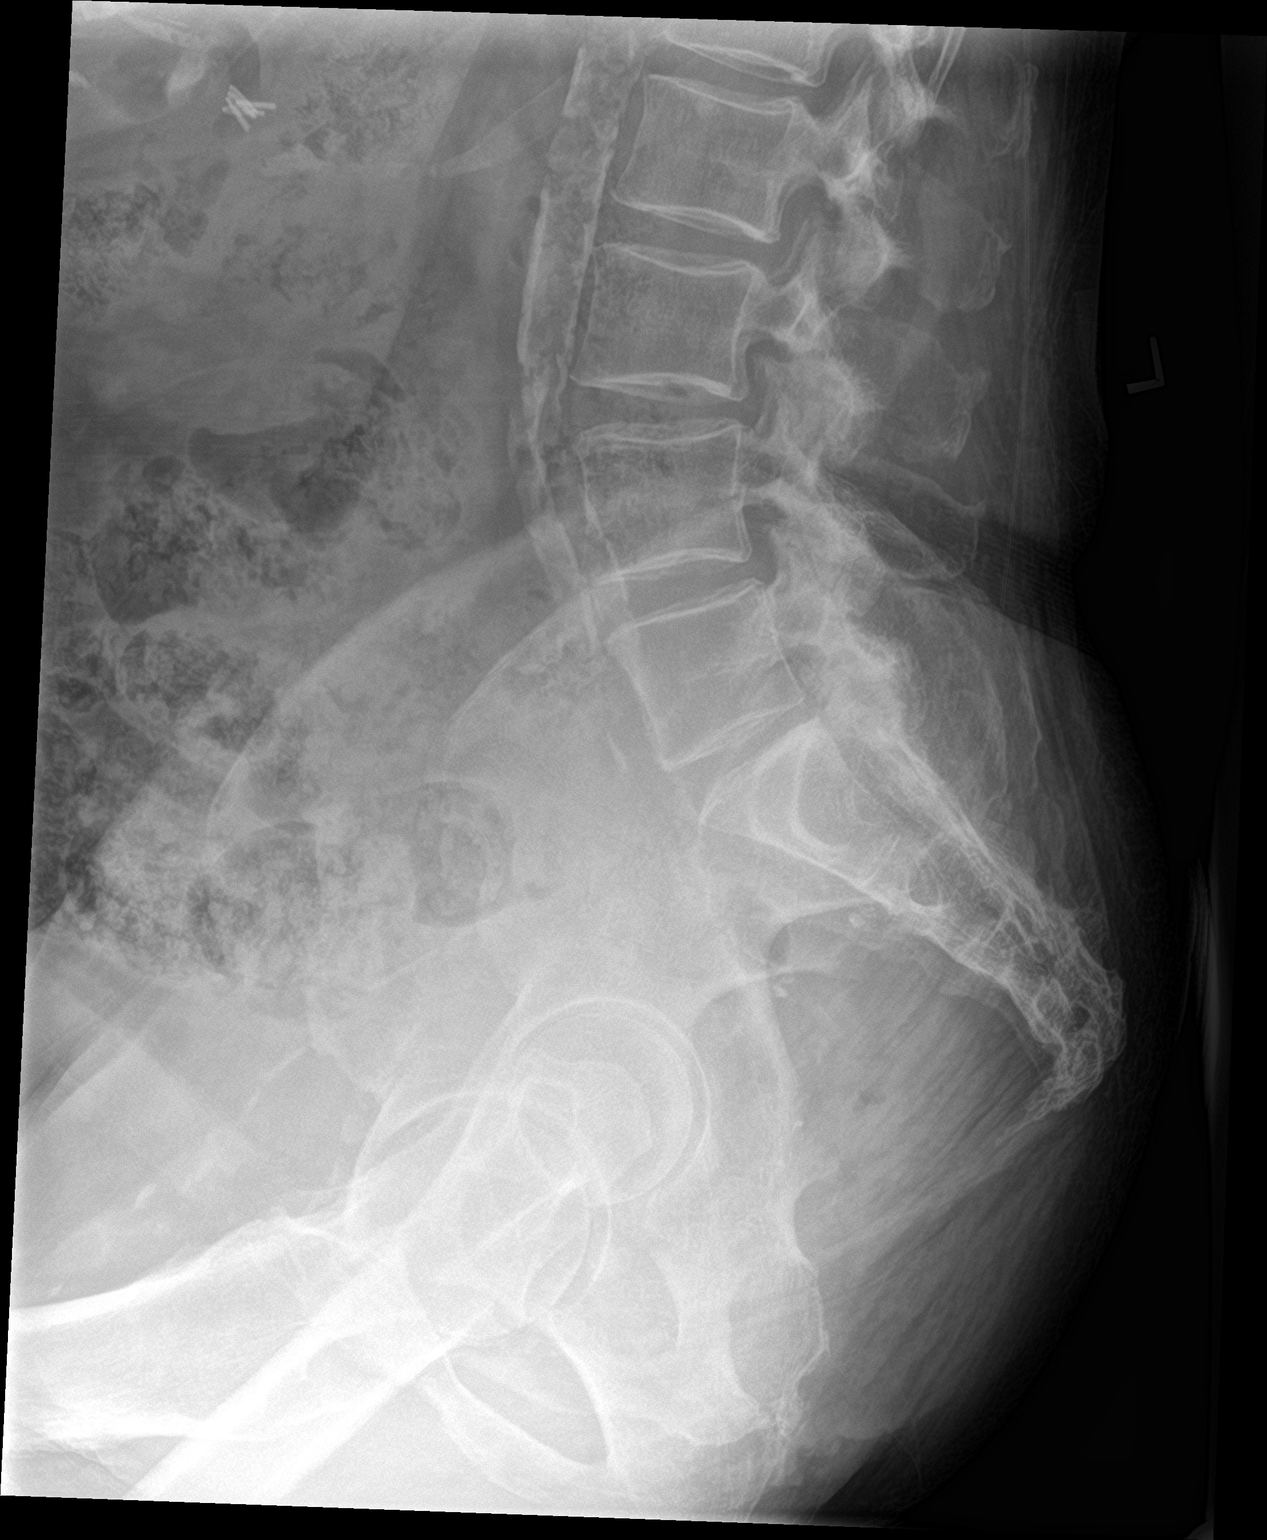

[3 of 3 positions shown; findings below may reference images not displayed]

FINDINGS: Frontal, lateral, and spot lumbosacral lateral images were obtained.
There are 5 non-rib-bearing lumbar type vertebral bodies. There is
no fracture or spondylolisthesis. Moderately severe disc space
narrowing T12-L1 is stable. Lumbar discs appear unremarkable. There
is facet osteoarthritic change at L4-5 and L5-S1 bilaterally. There
is aortic and iliac artery atherosclerosis.
IMPRESSION: Osteoarthritic change, most notably at T12-L1. No fracture or
spondylolisthesis.

Aortic Atherosclerosis ([id number]-7LS.S).

## 2021-07-14 IMAGING — CT CT CERVICAL SPINE W/O CM
3 of 4 series · 13 of 33 positions shown, 15 images · non-contrast
Comparison: 05/02/2020

CLINICAL DATA: Fell at [REDACTED], this was standing,
became dizzy and fell backwards striking back of head. Denies loss
of consciousness. History CHF, COPD, coronary artery disease,
diabetes mellitus, hypertension

EXAM:
CT HEAD WITHOUT CONTRAST
CT CERVICAL SPINE WITHOUT CONTRAST
TECHNIQUE: Multidetector CT imaging of the head and cervical spine was
performed following the standard protocol without intravenous
contrast. Multiplanar CT image reconstructions of the cervical spine
were also generated.

[Series 3: c spine soft · axial · 0.35mm/px · z∈[-276,-174]mm · 4 of 87 slices shown]
[im 18/87  soft-tissue]
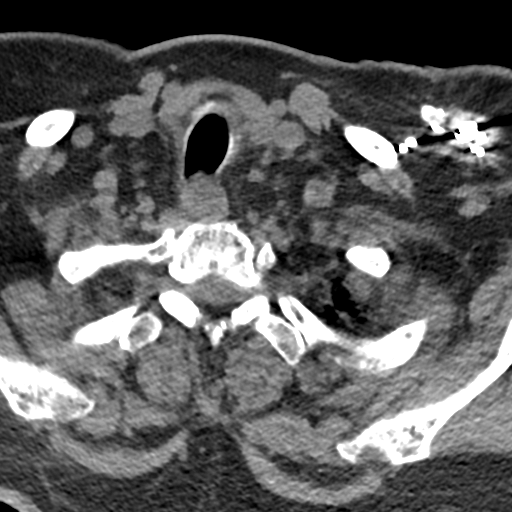
[im 35/87  soft-tissue]
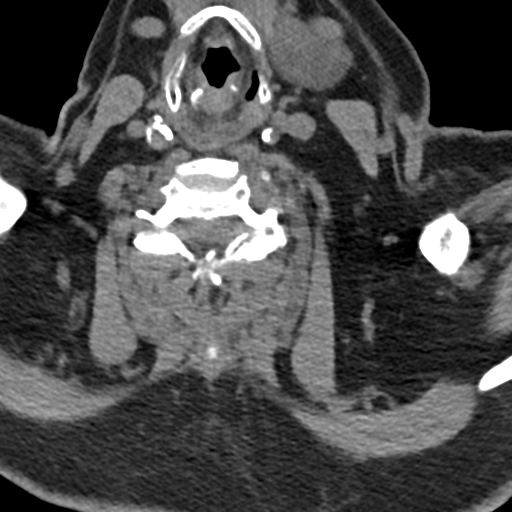
[im 52/87  soft-tissue]
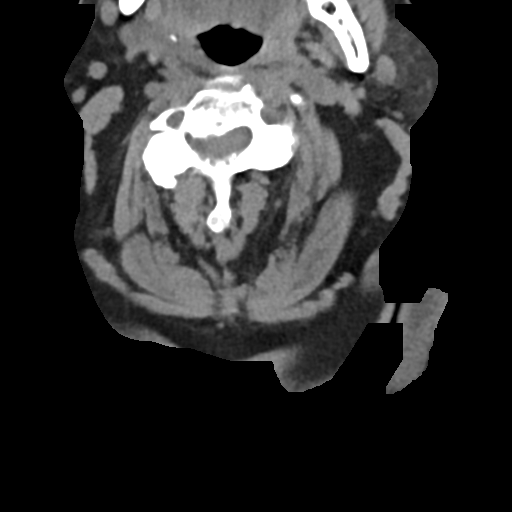
[im 69/87  soft-tissue]
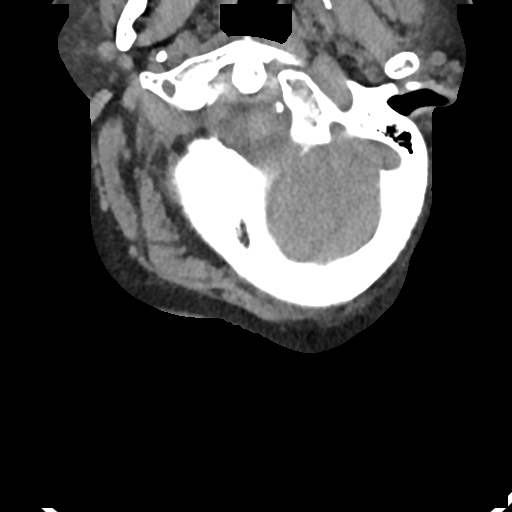

[Series 7: coronal bone · coronal · 0.22mm/px · 3 of 54 slices shown]
[im 14/54  bone]
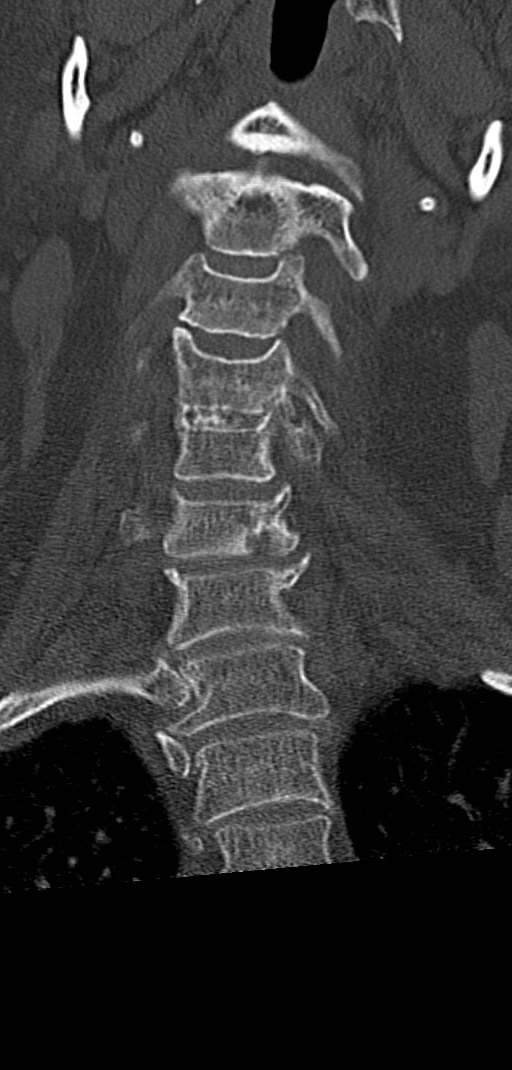
[im 23/54  bone]
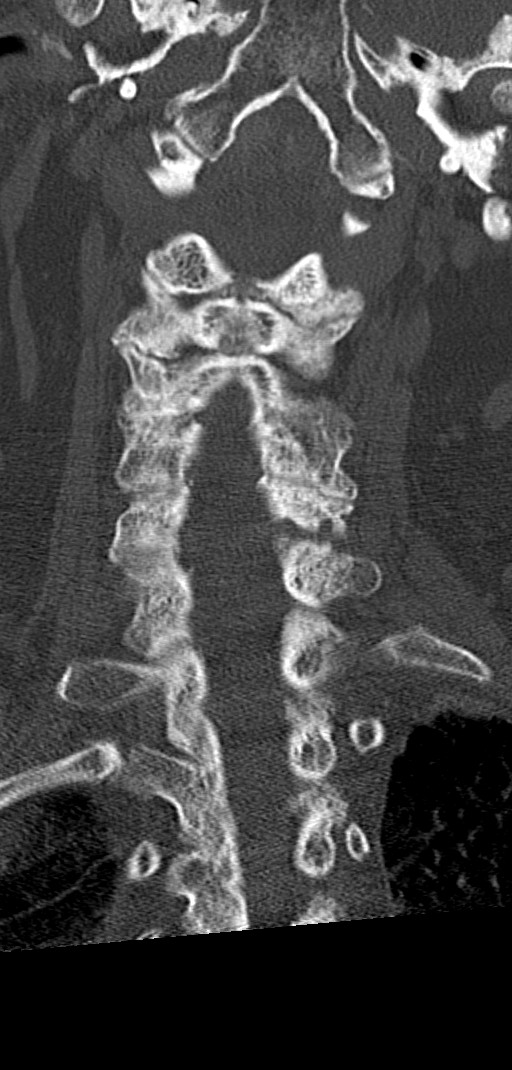
[im 31/54  bone]
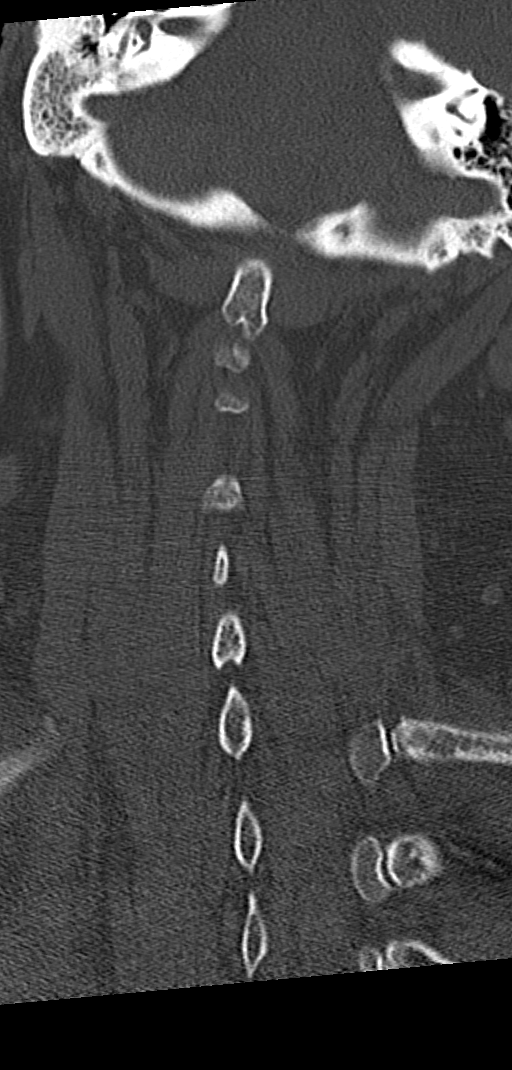

[Series 8: orthogonal bone · axial · 0.21mm/px · z∈[-331,-195]mm · 6 of 118 slices shown, 8 images]
[im 17/118  soft-tissue]
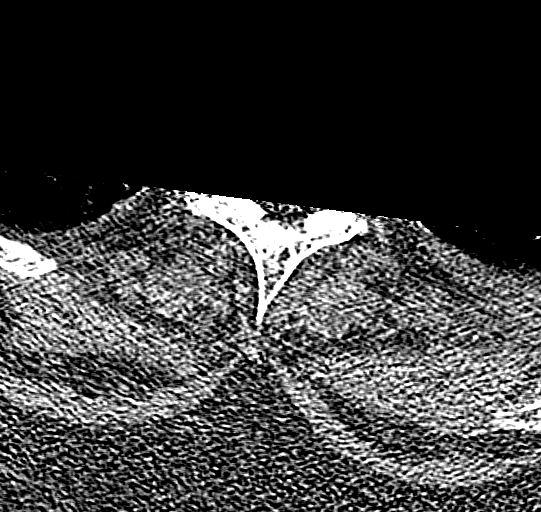
[im 17/118  bone]
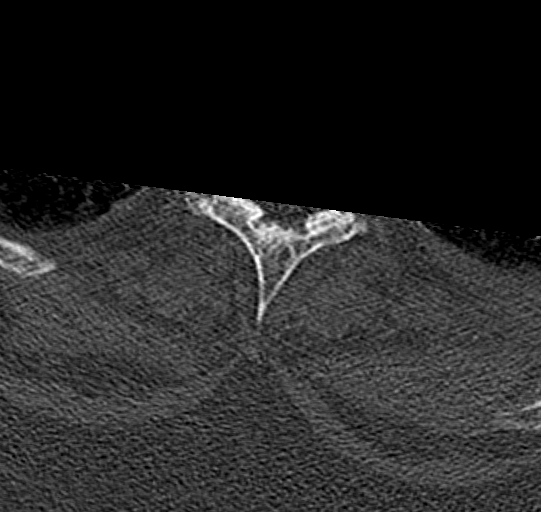
[im 34/118  bone]
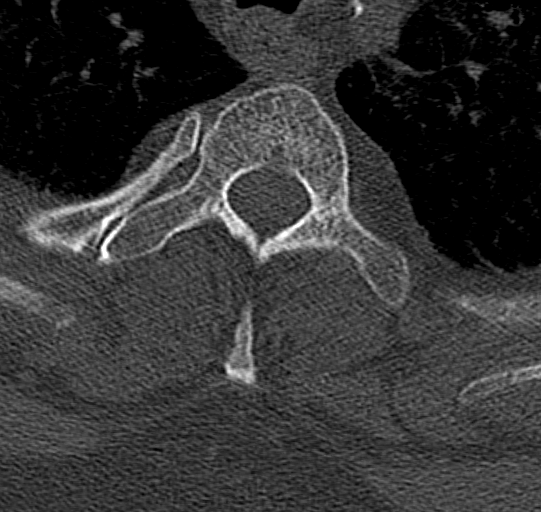
[im 51/118  bone]
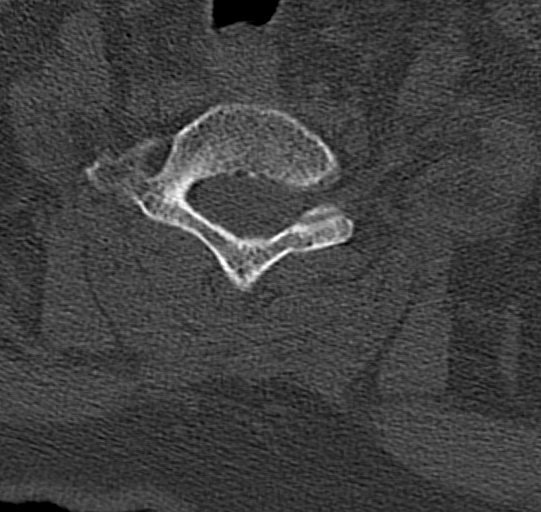
[im 67/118  bone]
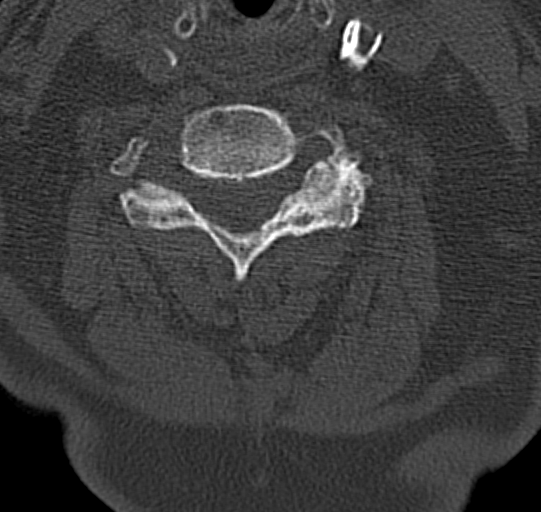
[im 84/118  soft-tissue]
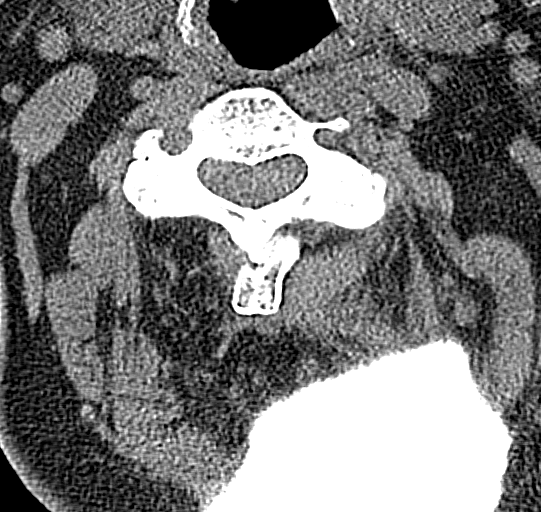
[im 84/118  bone]
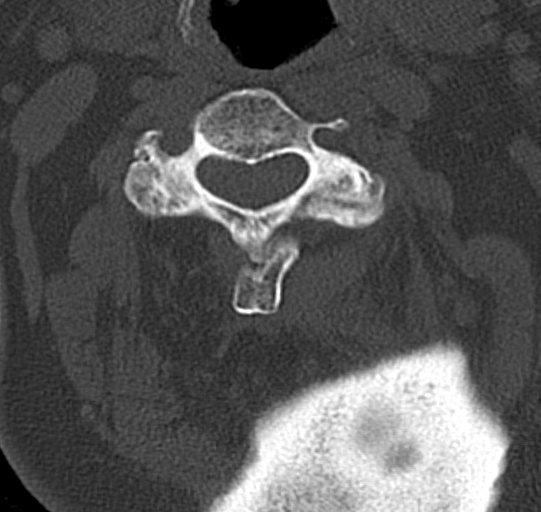
[im 101/118  bone]
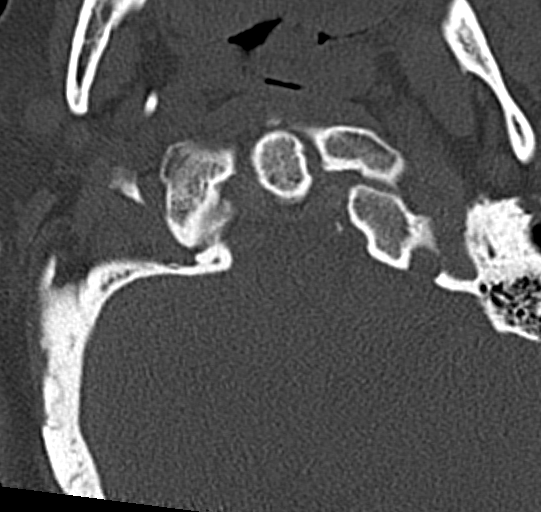

[13 of 33 positions shown; findings below may reference images not displayed]

FINDINGS: CT HEAD FINDINGS

Brain: Generalized atrophy. Ex vacuo dilatation of the ventricular
system. No midline shift or mass effect. Small vessel chronic
ischemic changes of deep cerebral white matter. Small old basal
ganglia lacunar infarcts. No intracranial hemorrhage, mass lesion or
evidence of acute infarction. No extra-axial fluid collections.

Vascular: No hyperdense vessels. Atherosclerotic calcification of
internal carotid and vertebral arteries at skull base.

Skull: Intact

Sinuses/Orbits: Clear

Other: N/A

CT CERVICAL SPINE FINDINGS

Alignment: Minimal anterolisthesis C4-C5 unchanged. Remaining
alignments normal.

Skull base and vertebrae: Osseous demineralization. Incomplete
posterior arch C1, developmental anomaly. Vertebral body heights
maintained. Scattered disc space narrowing and minimal endplate spur
formation. Multilevel facet degenerative changes. No definite
fracture, additional subluxation, or bone destruction. Lateral
cervical flexion to LEFT.

Soft tissues and spinal canal: Prevertebral soft tissues normal
thickness

Disc levels: Significant atherosclerotic calcifications at carotid
bifurcations with additional calcifications in proximal great
vessels.

Upper chest: Interseptal thickening at apices. Small pleural
effusion.

Other: N/A
IMPRESSION: Atrophy with small vessel chronic ischemic changes of deep cerebral
white matter.

Old basal ganglia lacunar infarcts.

No acute intracranial abnormalities.

Multilevel degenerative disc and facet disease changes of the
cervical spine.

No acute cervical spine abnormalities.

Significant atherosclerotic calcification at BILATERAL carotid
bifurcations; recommend correlation for risk factors for
cerebrovascular disease and consider carotid duplex sonography
assessment.

## 2021-08-04 IMAGING — DX DG CHEST 1V PORT
1 series · 1 of 1 positions shown · non-contrast
Comparison: 08/05/2020

CLINICAL DATA: Dyspnea

EXAM:
PORTABLE CHEST 1 VIEW

[chest ap]
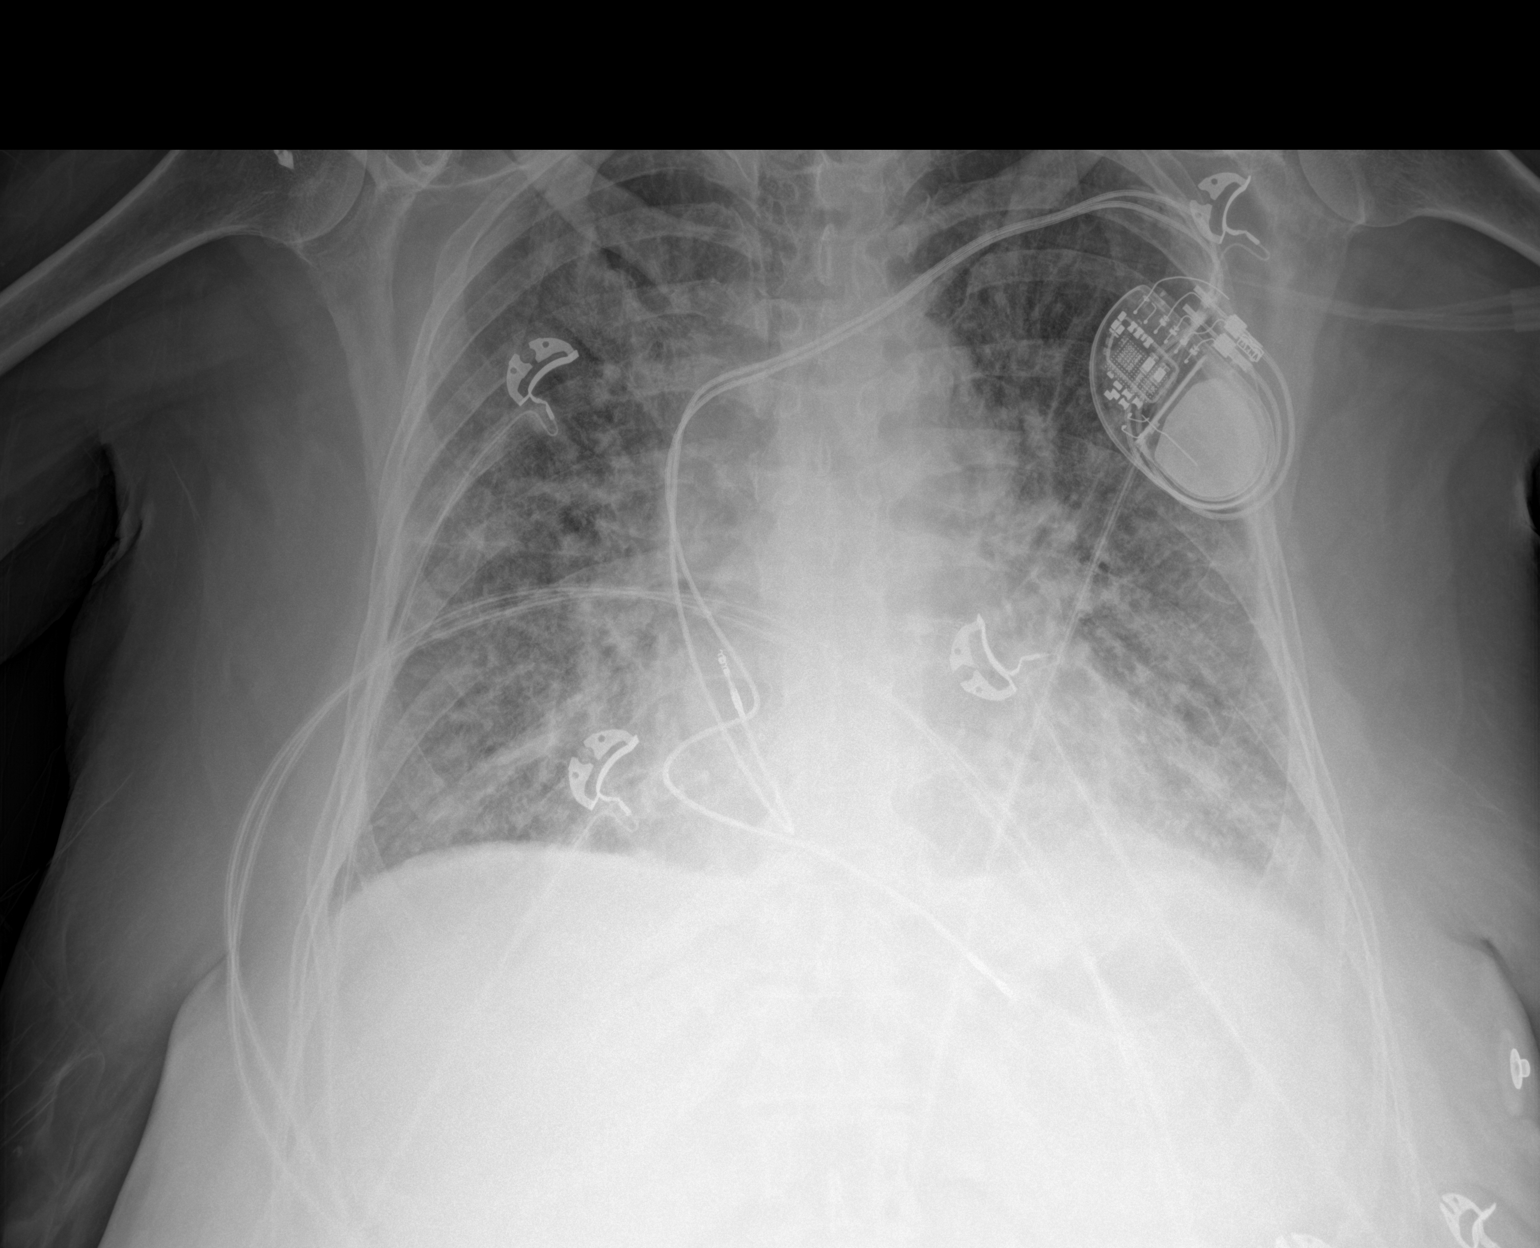

[1 of 1 positions shown; findings below may reference images not displayed]

FINDINGS: The lungs are symmetrically well expanded. No pneumothorax or
pleural effusion. The pulmonary vascularity is diffusely increased
with moderate diffuse interstitial pulmonary edema present in
keeping with changes of moderate cardiogenic failure. Cardiac size
within normal limits. Left subclavian dual lead pacemaker is
unchanged. No acute bone abnormality.
IMPRESSION: Moderate cardiogenic failure.

## 2021-08-08 IMAGING — DX DG CHEST 1V PORT
1 series · 1 of 1 positions shown · non-contrast
Comparison: 08/26/2020

CLINICAL DATA: Cough and pulmonary edema, cardiac cause.

EXAM:
PORTABLE CHEST 1 VIEW

[chest ap]
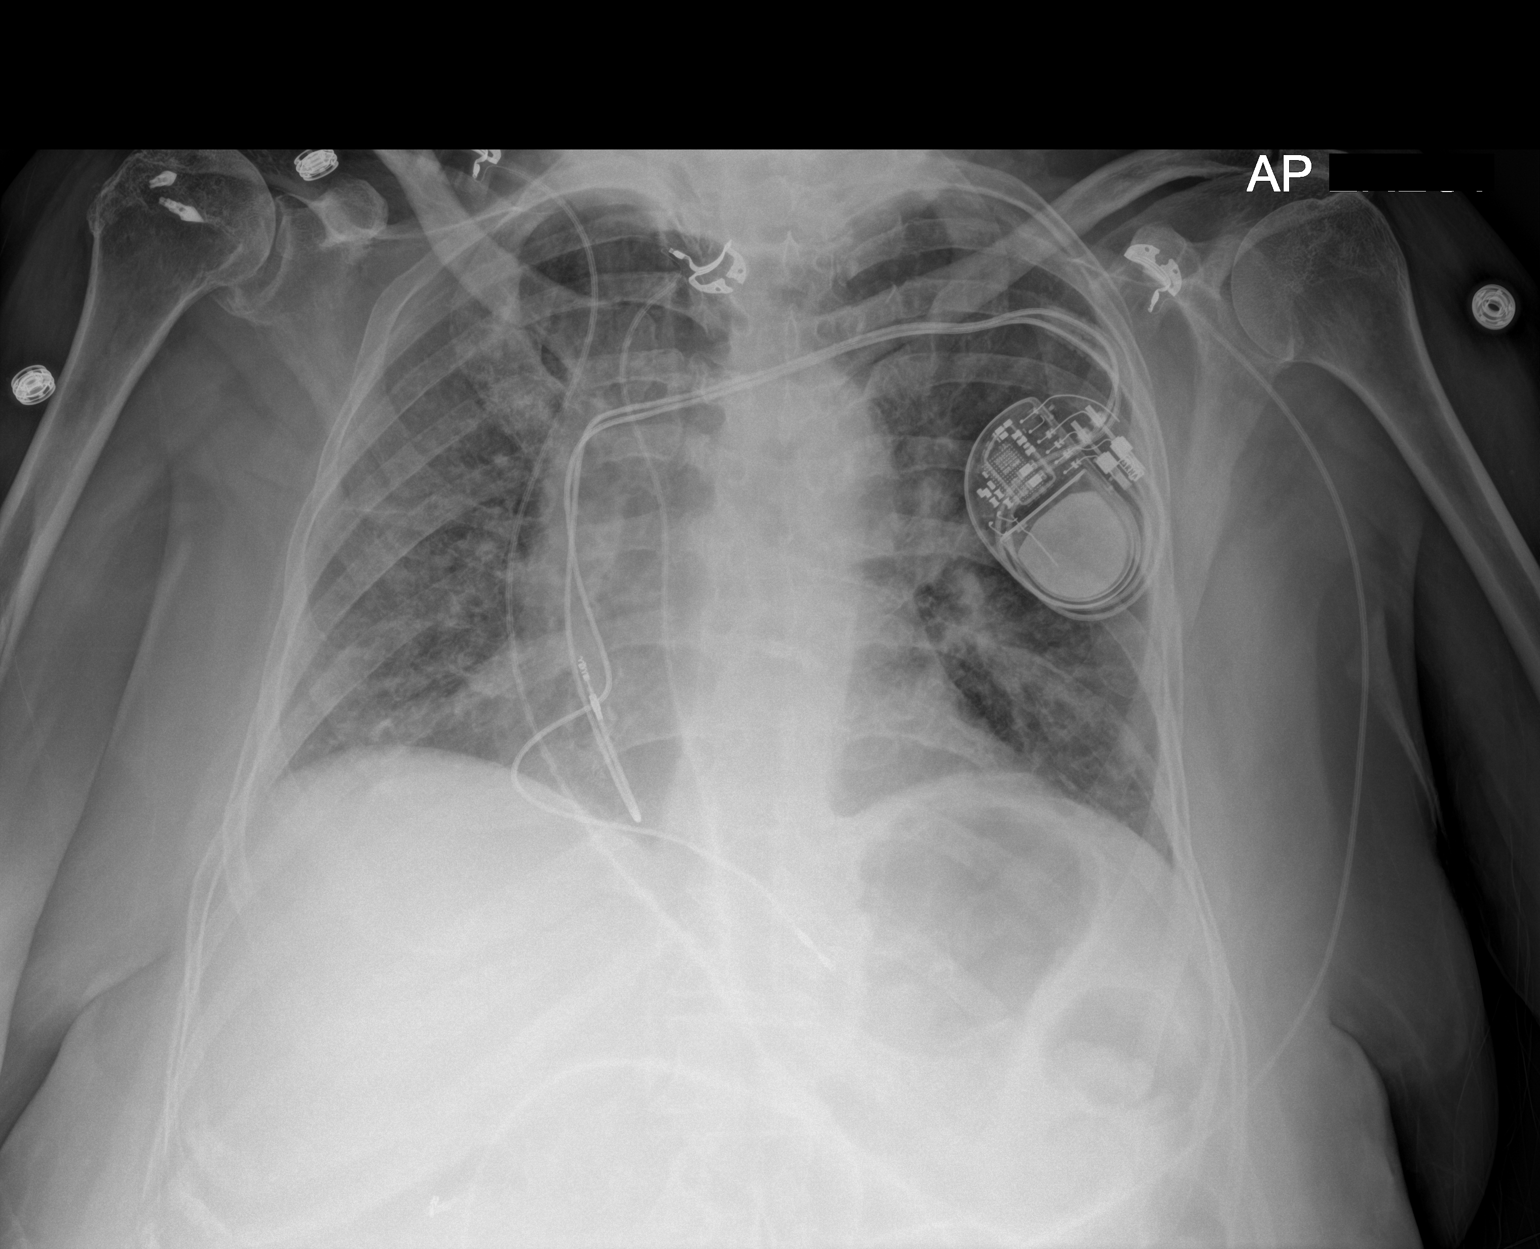

[1 of 1 positions shown; findings below may reference images not displayed]

FINDINGS: Patient has LEFT-sided transvenous pacemaker with leads to the RIGHT
atrium and RIGHT ventricle. Images made with shallow lung inflation.
There are persistent patchy opacities throughout the lungs
bilaterally, with slight improvement in aeration of the lung bases.
No pneumothorax.
IMPRESSION: Low lung volumes.

Persistent but slightly improved bilateral pulmonary opacities.

## 2021-08-13 ENCOUNTER — Emergency Department: Payer: Medicare Other

## 2021-08-13 ENCOUNTER — Other Ambulatory Visit: Payer: Self-pay

## 2021-08-13 ENCOUNTER — Emergency Department
Admission: EM | Admit: 2021-08-13 | Discharge: 2021-08-13 | Disposition: A | Discharge status: Skilled Nursing Facility | Payer: Medicare Other | Attending: Emergency Medicine | Admitting: Emergency Medicine

## 2021-08-13 ENCOUNTER — Encounter: Payer: Self-pay | Admitting: Emergency Medicine

## 2021-08-13 DIAGNOSIS — F0390 Unspecified dementia without behavioral disturbance: Secondary | ICD-10-CM | POA: Insufficient documentation

## 2021-08-13 DIAGNOSIS — S0003XA Contusion of scalp, initial encounter: Secondary | ICD-10-CM | POA: Insufficient documentation

## 2021-08-13 DIAGNOSIS — W07XXXA Fall from chair, initial encounter: Secondary | ICD-10-CM | POA: Insufficient documentation

## 2021-08-13 DIAGNOSIS — E119 Type 2 diabetes mellitus without complications: Secondary | ICD-10-CM | POA: Diagnosis not present

## 2021-08-13 DIAGNOSIS — R82998 Other abnormal findings in urine: Secondary | ICD-10-CM | POA: Insufficient documentation

## 2021-08-13 DIAGNOSIS — S0083XA Contusion of other part of head, initial encounter: Secondary | ICD-10-CM | POA: Diagnosis present

## 2021-08-13 DIAGNOSIS — Y92129 Unspecified place in nursing home as the place of occurrence of the external cause: Secondary | ICD-10-CM | POA: Insufficient documentation

## 2021-08-13 LAB — CBC WITH DIFFERENTIAL/PLATELET
Abs Immature Granulocytes: 0.05 10*3/uL (ref 0.00–0.07)
Basophils Absolute: 0.1 10*3/uL (ref 0.0–0.1)
Basophils Relative: 1 %
Eosinophils Absolute: 0 10*3/uL (ref 0.0–0.5)
Eosinophils Relative: 0 %
HCT: 39.9 % (ref 36.0–46.0)
Hemoglobin: 12.7 g/dL (ref 12.0–15.0)
Immature Granulocytes: 1 %
Lymphocytes Relative: 18 %
Lymphs Abs: 1.8 10*3/uL (ref 0.7–4.0)
MCH: 29.6 pg (ref 26.0–34.0)
MCHC: 31.8 g/dL (ref 30.0–36.0)
MCV: 93 fL (ref 80.0–100.0)
Monocytes Absolute: 1 10*3/uL (ref 0.1–1.0)
Monocytes Relative: 10 %
Neutro Abs: 7.1 10*3/uL (ref 1.7–7.7)
Neutrophils Relative %: 70 %
Platelets: 313 10*3/uL (ref 150–400)
RBC: 4.29 MIL/uL (ref 3.87–5.11)
RDW: 13.8 % (ref 11.5–15.5)
WBC: 10 10*3/uL (ref 4.0–10.5)
nRBC: 0 % (ref 0.0–0.2)

## 2021-08-13 LAB — BASIC METABOLIC PANEL
Anion gap: 9 (ref 5–15)
BUN: 35 mg/dL — ABNORMAL HIGH (ref 8–23)
CO2: 24 mmol/L (ref 22–32)
Calcium: 9.9 mg/dL (ref 8.9–10.3)
Chloride: 104 mmol/L (ref 98–111)
Creatinine, Ser: 1.58 mg/dL — ABNORMAL HIGH (ref 0.44–1.00)
GFR, Estimated: 34 mL/min — ABNORMAL LOW (ref >60)
Glucose, Bld: 143 mg/dL — ABNORMAL HIGH (ref 70–99)
Potassium: 4.5 mmol/L (ref 3.5–5.1)
Sodium: 137 mmol/L (ref 135–145)

## 2021-08-13 LAB — URINALYSIS, ROUTINE W REFLEX MICROSCOPIC
Bilirubin Urine: NEGATIVE
Glucose, UA: NEGATIVE mg/dL
Hgb urine dipstick: NEGATIVE
Ketones, ur: 5 mg/dL — AB
Leukocytes,Ua: NEGATIVE
Nitrite: NEGATIVE
Protein, ur: 100 mg/dL — AB
Specific Gravity, Urine: 1.015 (ref 1.005–1.030)
Squamous Epithelial / HPF: NONE SEEN (ref 0–5)
pH: 6 (ref 5.0–8.0)

## 2021-08-13 MED ORDER — HYDRALAZINE HCL 50 MG PO TABS
50.0000 mg | ORAL_TABLET | ORAL | Status: AC
Start: 2021-08-13 — End: 2021-08-13
  Administered 2021-08-13: 50 mg via ORAL
  Filled 2021-08-13: qty 1

## 2021-08-13 NOTE — Discharge Instructions (Addendum)
CT scan of the head and neck are okay today. Serum labs and urinalysis are all okay today.

## 2021-08-13 NOTE — ED Notes (Addendum)
Patient is sleeping. Patient wakes up to voice commands. Patient was able to take a drink of water.

## 2021-08-13 NOTE — ED Triage Notes (Signed)
Pt via EMS from [facility]. Pt states she fell asleep in the wheelchair and fell out of it. Denies any dizziness. Fall was unwitnessed. Pt has a hematoma to the L side of her forehead. Denies any other pain. Pt is responsive but slow to respond, unknown pt's baseline.

## 2021-08-13 NOTE — ED Notes (Signed)
Patient is sleeping on stretcher. Both side rails are up. Dr. Scotty Court aware of UA results.

## 2021-08-13 NOTE — ED Triage Notes (Signed)
Pt in via EMS from [facility] of [address] with c/o fall. Pt fell asleep in the chair and fell out of the chair, unwitnessed fall. Pt with hematoma on forehead. Pt takes asa. CBG 236.

## 2021-08-13 NOTE — ED Notes (Signed)
EMS arrived for patient. Patient was alert during transfer to EMS stretcher. Patient's belongings were placed on EMS stretcher by EMT.

## 2021-08-13 NOTE — ED Provider Notes (Signed)
[facility] Provider Note    Event Date/Time   First MD Initiated Contact with Patient 08/13/21 1118     (approximate)   History   Chief Complaint: Fall   HPI  Melanie King is a 76 y.o. female with a history of dementia and diabetes who is brought to the ED due to an unwitnessed fall.  Reportedly she fell asleep in a chair and fell out of the chair, hitting her forehead on the ground.  Patient has dementia and is able to provide only very limited description of her symptoms.  She denies headache or neck pain or other acute complaints, but endorses tenderness to hematoma on the forehead     Physical Exam   Triage Vital Signs: ED Triage Vitals  Enc Vitals Group     BP 08/13/21 1114 (!) 181/73     Pulse Rate 08/13/21 1114 72     Resp 08/13/21 1114 18     Temp 08/13/21 1114 98.2 F (36.8 C)     Temp Source 08/13/21 1114 Oral     SpO2 08/13/21 1114 95 %     Weight 08/13/21 1116 136 lb 11 oz (62 kg)     Height 08/13/21 1116 5\' 1"  (1.549 m)     Head Circumference --      Peak Flow --      Pain Score 08/13/21 1114 0     Pain Loc --      Pain Edu? --      Excl. in GC? --     Most recent vital signs: Vitals:   08/13/21 1114  BP: (!) 181/73  Pulse: 72  Resp: 18  Temp: 98.2 F (36.8 C)  SpO2: 95%    General: Awake, no distress.  Strong foul odor of urine CV:  Good peripheral perfusion.  Regular rate rhythm Resp:  Normal effort.  Clear to auscultation bilaterally Abd:  No distention.  Soft nontender Other:  No deformities.  Full range of motion.  No lacerations.  No significant edema   ED Results / Procedures / Treatments   Labs (all labs ordered are listed, but only abnormal results are displayed) Labs Reviewed  URINE CULTURE  URINALYSIS, ROUTINE W REFLEX MICROSCOPIC  BASIC METABOLIC PANEL  CBC WITH DIFFERENTIAL/PLATELET     EKG Interpreted by me Atrial paced rhythm, rate of 70.  Left axis, right bundle branch block.  No  acute ischemic changes.   RADIOLOGY CT head viewed and interpreted by me, negative for ICH.  Radiology report reviewed.  CT cervical spine unremarkable.   PROCEDURES:  Procedures   MEDICATIONS ORDERED IN ED: Medications - No data to display   IMPRESSION / MDM / ASSESSMENT AND PLAN / ED COURSE  I reviewed the triage vital signs and the nursing notes.                              Differential diagnosis includes, but is not limited to, intracranial hemorrhage, skull fracture, C-spine fracture, dehydration, renal insufficiency, UTI, electrolyte abnormality, hypoglycemia  Patient's presentation is most consistent with acute presentation with potential threat to life or bodily function.  Patient presents to the ED with unwitnessed fall.  Patient is not able to provide much history or participate in exam.  1) fall with head trauma -CT head, CT cervical spine  2) diabetes, elderly, dementia -labs including metabolic panel and urinalysis   ----------------------------------------- 2:31 PM on 08/13/2021 -----------------------------------------  CT imaging unremarkable.  Serum labs are unremarkable with creatinine at or better than chronic baseline.  No anemia.  Patient will not require admission and can be discharged back to her skilled nursing facility.  Will await urinalysis to determine if she needs to be prescribed antibiotics.     FINAL CLINICAL IMPRESSION(S) / ED DIAGNOSES   Final diagnoses:  Scalp hematoma, initial encounter  Chronic dementia (HCC)  Type 2 diabetes mellitus without complication, without long-term current use of insulin (HCC)     Rx / DC Orders   ED Discharge Orders     None        Note:  This document was prepared using Dragon voice recognition software and may include unintentional dictation errors.   Sharman Cheek, MD 08/13/21 1432

## 2021-08-16 LAB — URINE CULTURE: Culture: >=100000 — AB

## 2021-09-03 DEATH — deceased

## 2021-09-20 IMAGING — CT CT CERVICAL SPINE W/O CM
2 series · 13 of 27 positions shown, 16 images · non-contrast
Comparison: CT brain and cervical spine 08/05/2020

CLINICAL DATA: Fall

EXAM:
CT HEAD WITHOUT CONTRAST
CT CERVICAL SPINE WITHOUT CONTRAST
TECHNIQUE: Multidetector CT imaging of the head and cervical spine was
performed following the standard protocol without intravenous
contrast. Multiplanar CT image reconstructions of the cervical spine
were also generated.

[Series 2: c spine soft · axial · 0.37mm/px · z∈[+335,+451]mm · 8 of 70 slices shown, 10 images]
[im 6/70  soft-tissue]
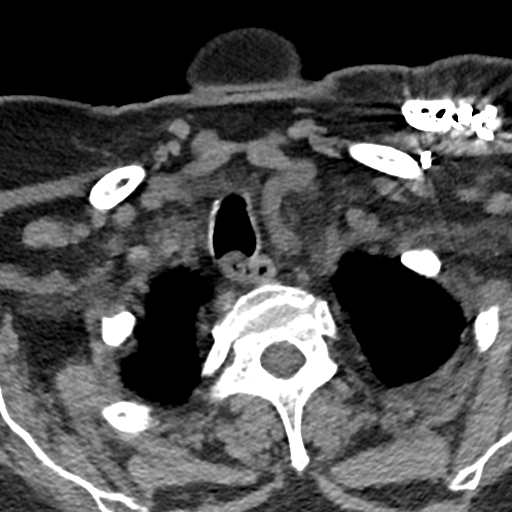
[im 6/70  bone]
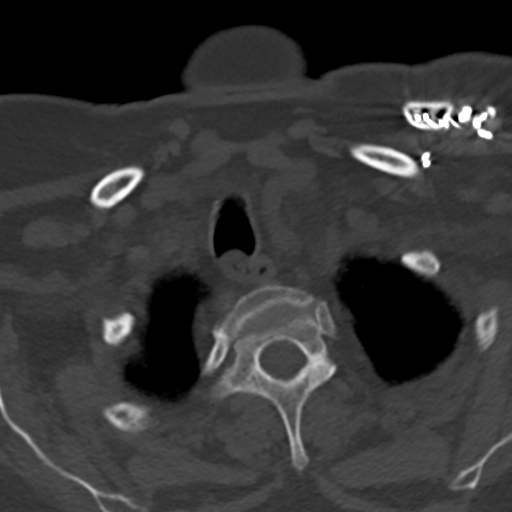
[im 16/70  bone]
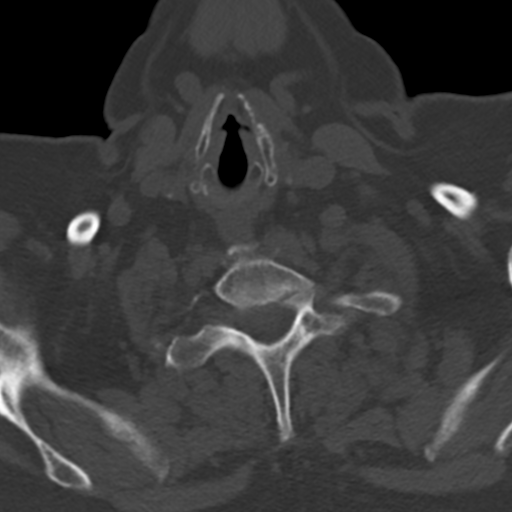
[im 22/70  bone]
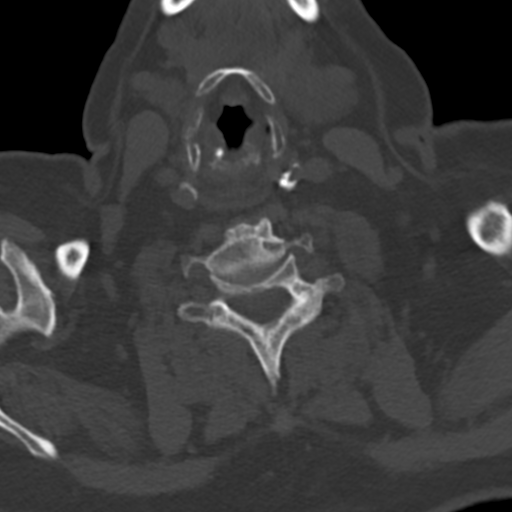
[im 32/70  bone]
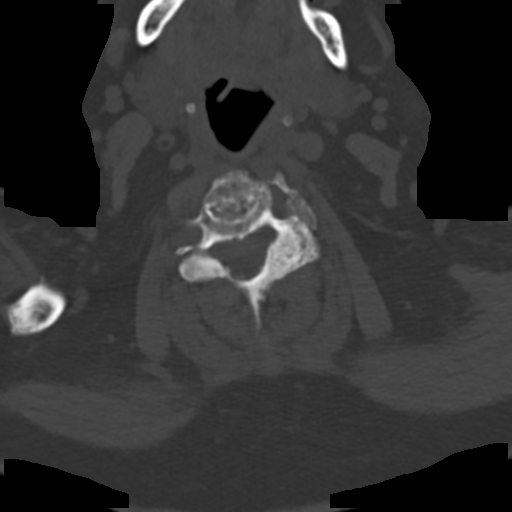
[im 38/70  soft-tissue]
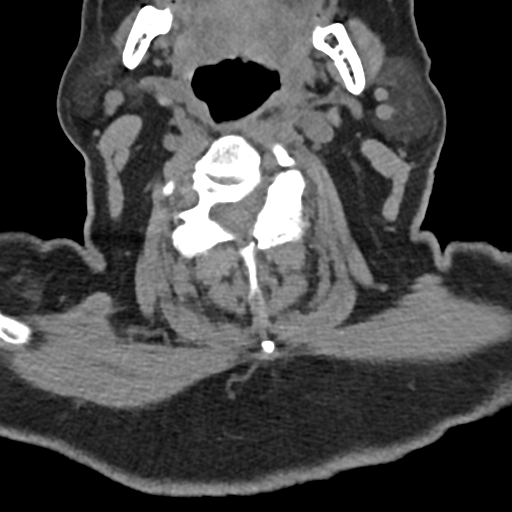
[im 38/70  bone]
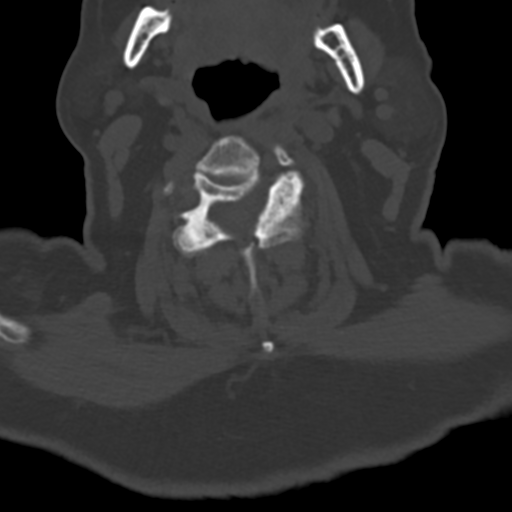
[im 48/70  bone]
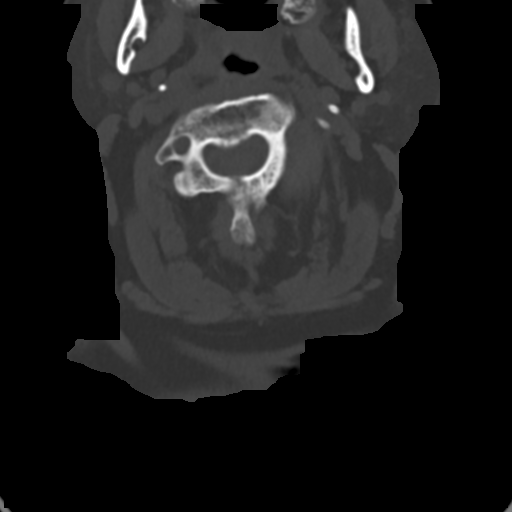
[im 54/70  bone]
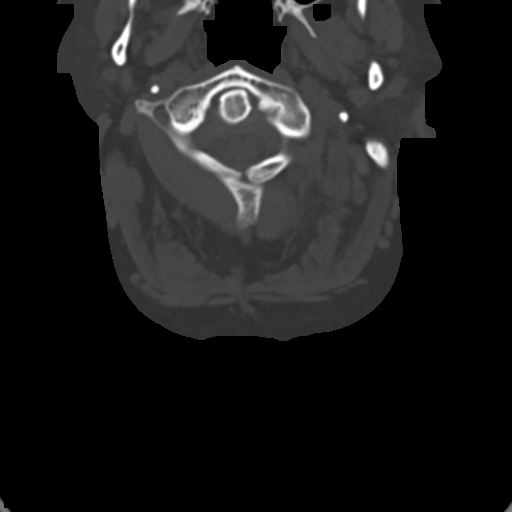
[im 64/70  bone]
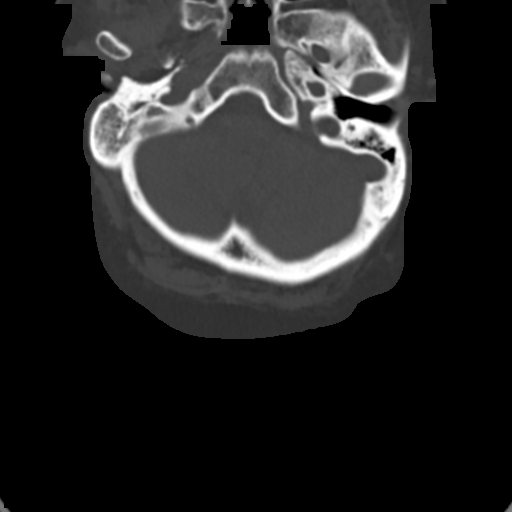

[Series 4: sagittal bone · sagittal · 0.28mm/px · 5 of 106 slices shown, 6 images]
[im 36/106  bone]
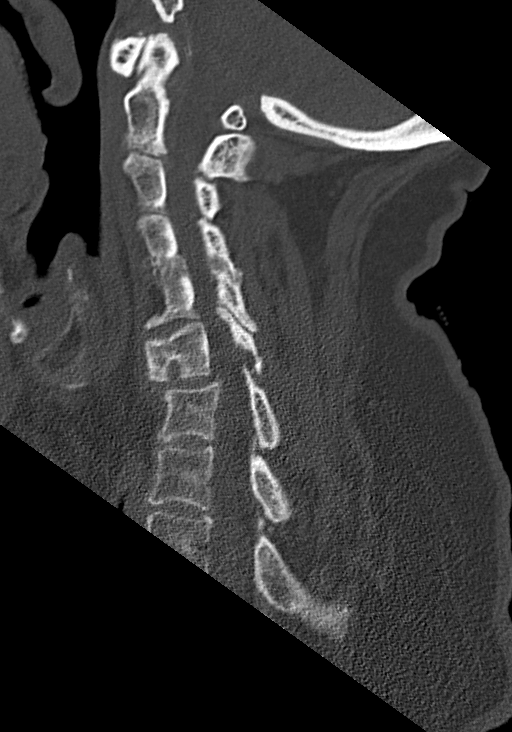
[im 44/106  bone]
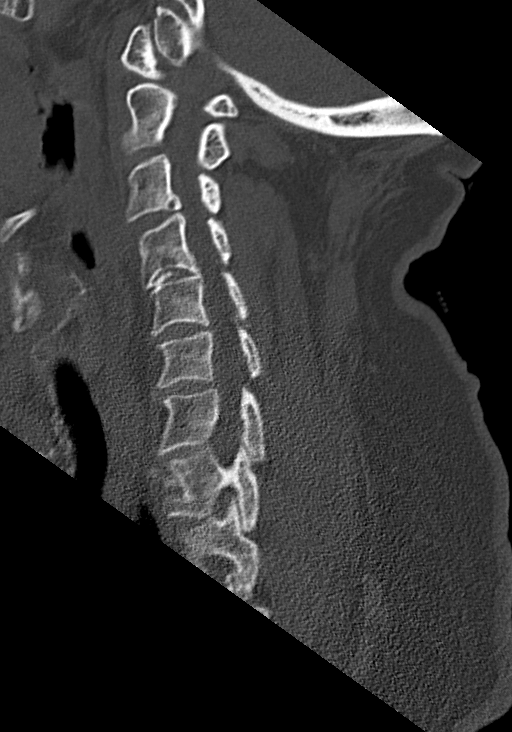
[im 53/106  soft-tissue]
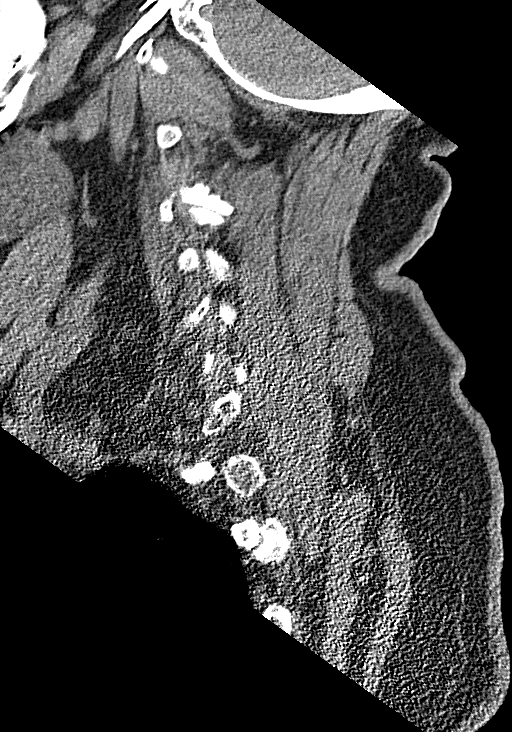
[im 53/106  bone]
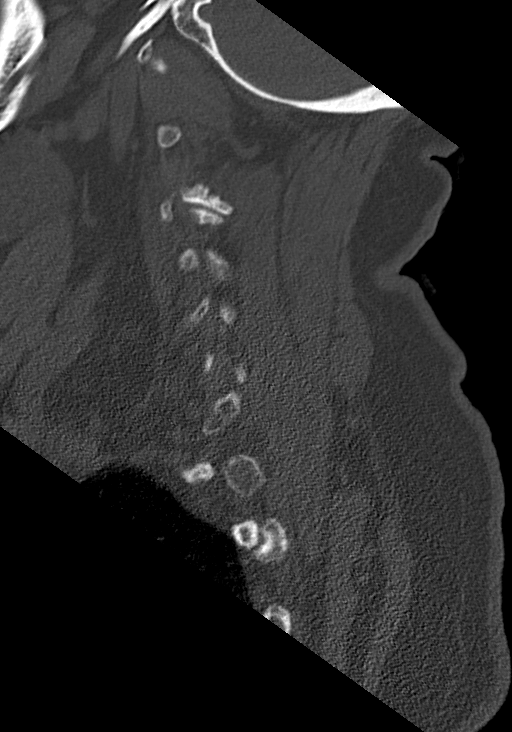
[im 62/106  bone]
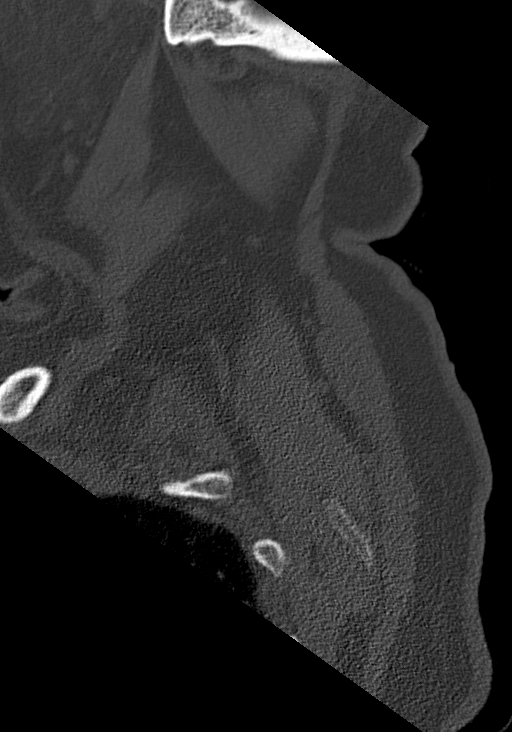
[im 71/106  bone]
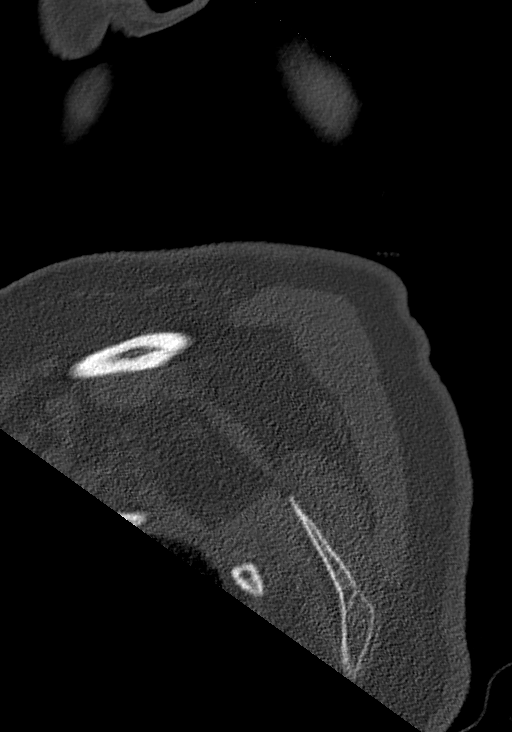

[13 of 27 positions shown; findings below may reference images not displayed]

FINDINGS: CT HEAD FINDINGS

Brain: No acute territorial infarction, hemorrhage or intracranial
mass. Atrophy and mild chronic small vessel ischemic changes of the
white matter. Stable ventricle size. Chronic lacunar infarcts in the
right basal ganglia.

Vascular: No hyperdense vessels. Carotid and vertebral vascular
calcification

Skull: Normal. Negative for fracture or focal lesion.

Sinuses/Orbits: No acute finding.

Other: None

CT CERVICAL SPINE FINDINGS

Alignment: Mild reversal of cervical lordosis. Trace anterolisthesis
C4 on C5 and C5 on C6 without change. Facet alignment maintained

Skull base and vertebrae: No acute fracture. No primary bone lesion
or focal pathologic process. Incomplete fusion posterior arch of C1

Soft tissues and spinal canal: No prevertebral fluid or swelling. No
visible canal hematoma.

Disc levels: Multilevel degenerative change, most advanced at C4-C5
and C6-C7. Facet degenerative changes at multiple levels.

Upper chest: Negative.

Other: None
IMPRESSION: 1. No CT evidence for acute intracranial abnormality. Atrophy and
chronic small vessel ischemic changes of the white matter.
2. Mild reversal of cervical lordosis with degenerative change. No
acute osseous abnormality

## 2021-09-20 IMAGING — CT CT HEAD W/O CM
3 series · 15 of 47 positions shown, 18 images · non-contrast
Comparison: CT brain and cervical spine 08/05/2020

CLINICAL DATA: Fall

EXAM:
CT HEAD WITHOUT CONTRAST
CT CERVICAL SPINE WITHOUT CONTRAST
TECHNIQUE: Multidetector CT imaging of the head and cervical spine was
performed following the standard protocol without intravenous
contrast. Multiplanar CT image reconstructions of the cervical spine
were also generated.

[Series 3: coronal soft tissue · coronal · 0.30mm/px · 3 of 66 slices shown]
[im 22/66  brain]
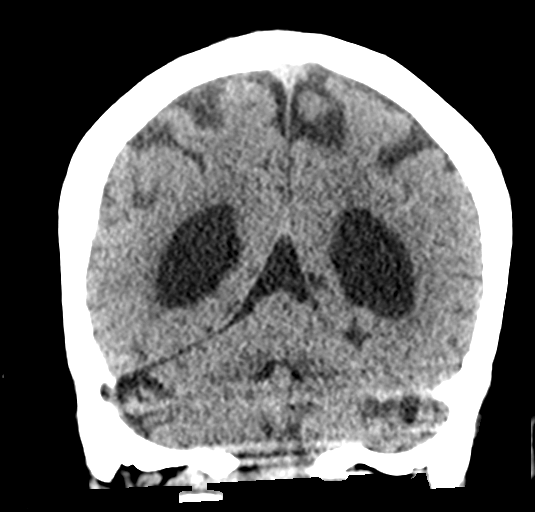
[im 29/66  brain]
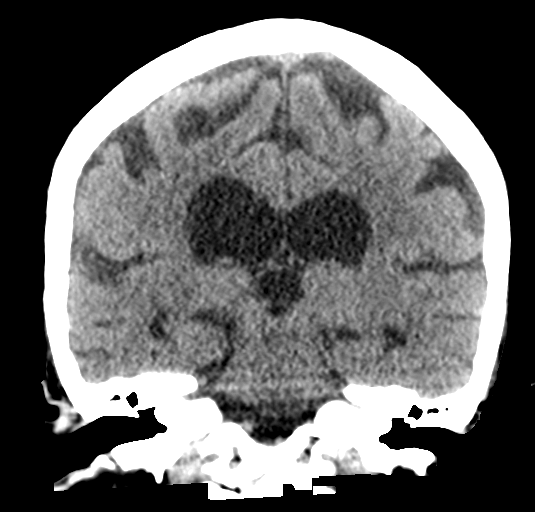
[im 37/66  brain]
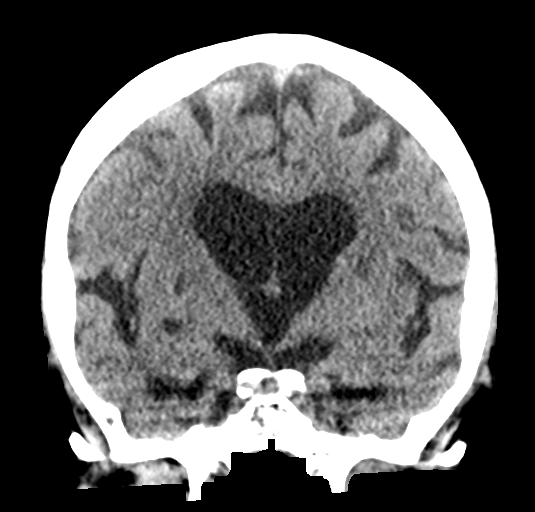

[Series 4: sagittal soft tissue · sagittal · 0.30mm/px · 3 of 54 slices shown]
[im 18/54  brain]
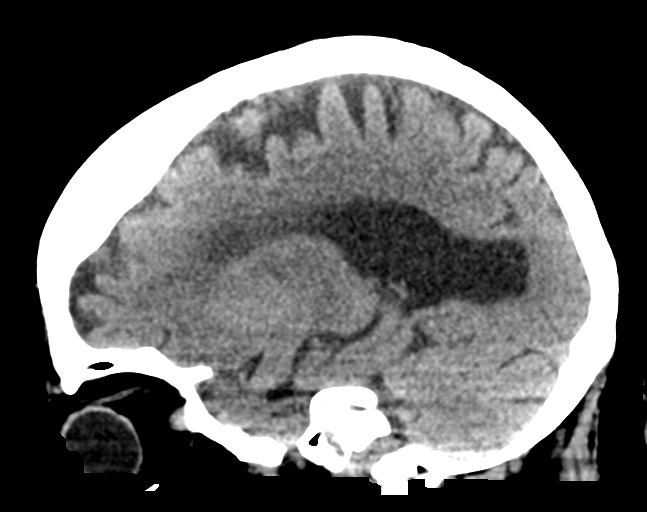
[im 27/54  brain]
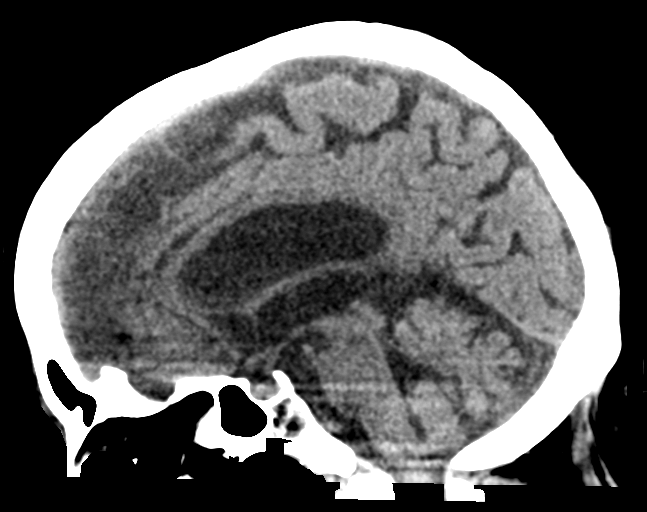
[im 36/54  brain]
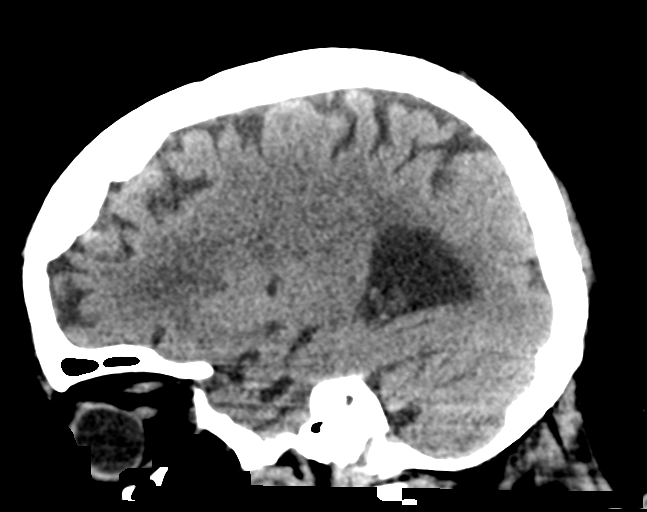

[Series 5: head wo · axial · 0.41mm/px · z∈[+463,+593]mm · 9 of 32 slices shown, 12 images]
[im 3/32  brain]
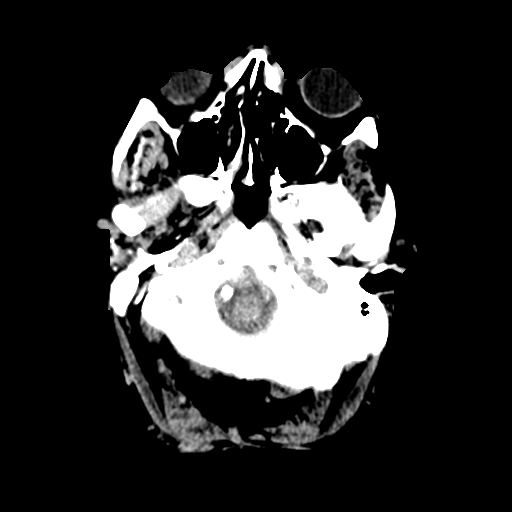
[im 3/32  bone]
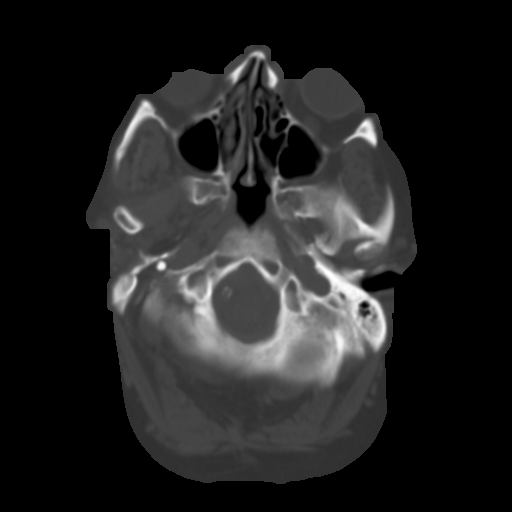
[im 6/32  brain]
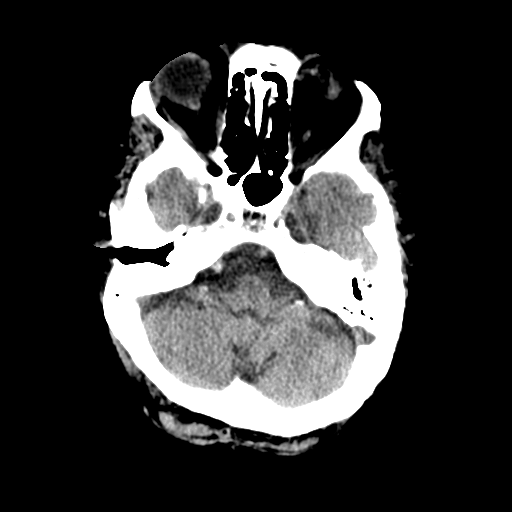
[im 9/32  brain]
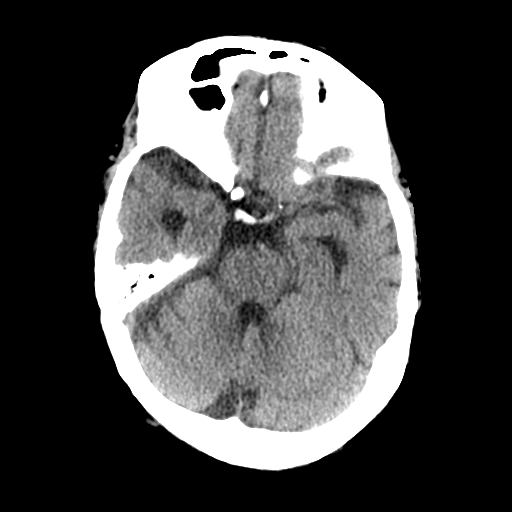
[im 12/32  brain]
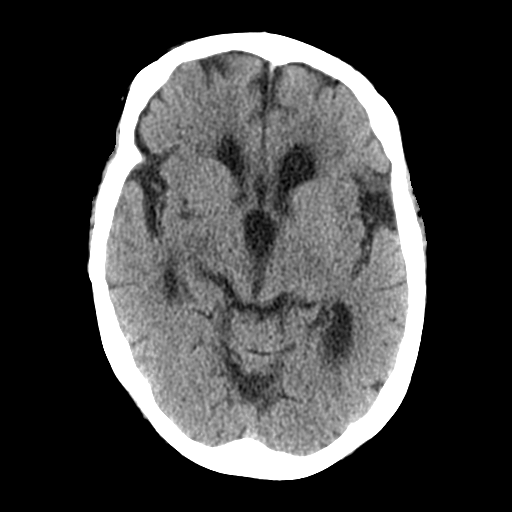
[im 17/32  brain]
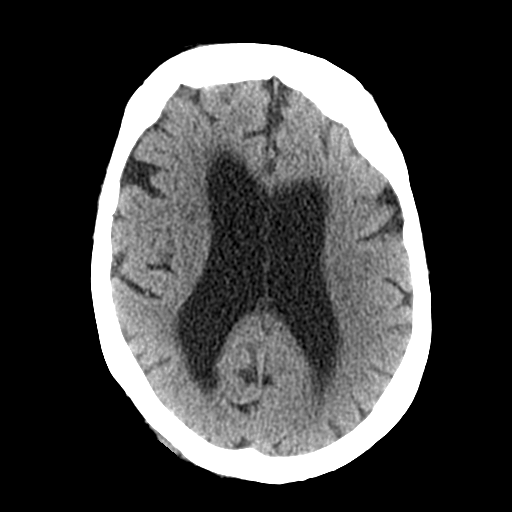
[im 17/32  bone]
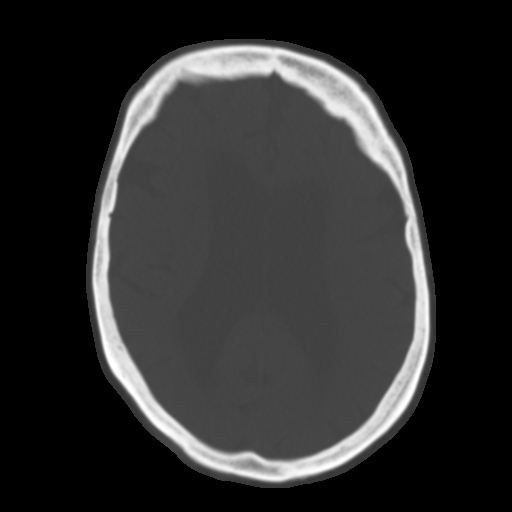
[im 20/32  brain]
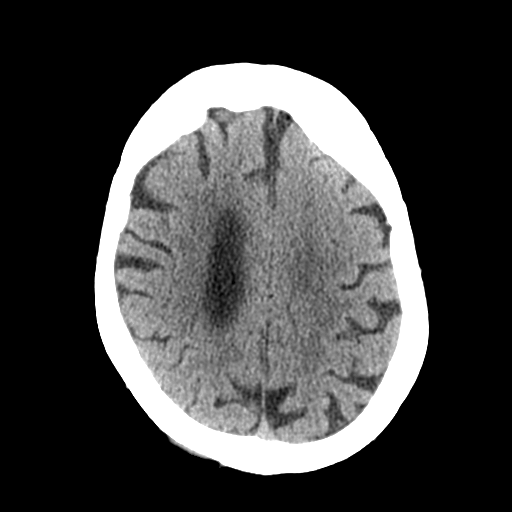
[im 23/32  brain]
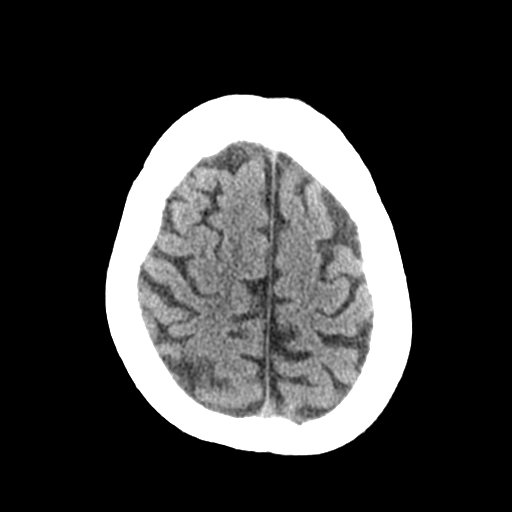
[im 26/32  brain]
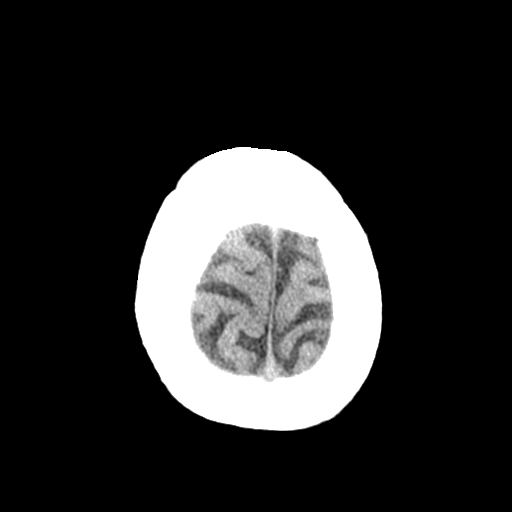
[im 29/32  brain]
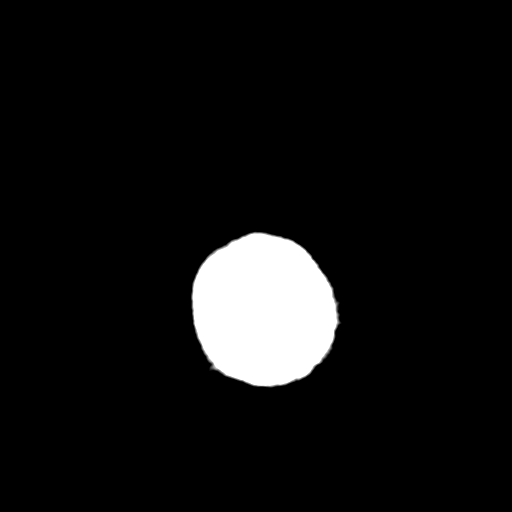
[im 29/32  bone]
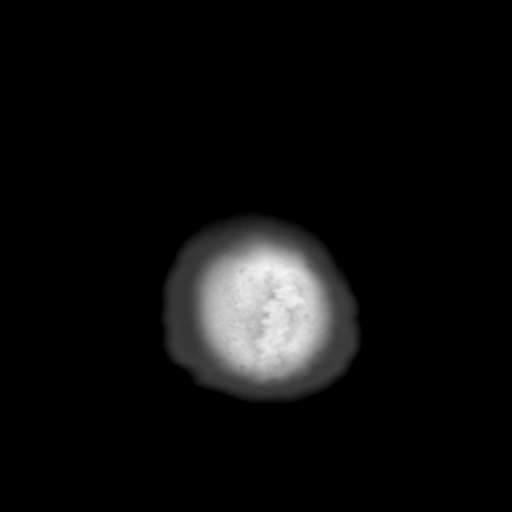

[15 of 47 positions shown; findings below may reference images not displayed]

FINDINGS: CT HEAD FINDINGS

Brain: No acute territorial infarction, hemorrhage or intracranial
mass. Atrophy and mild chronic small vessel ischemic changes of the
white matter. Stable ventricle size. Chronic lacunar infarcts in the
right basal ganglia.

Vascular: No hyperdense vessels. Carotid and vertebral vascular
calcification

Skull: Normal. Negative for fracture or focal lesion.

Sinuses/Orbits: No acute finding.

Other: None

CT CERVICAL SPINE FINDINGS

Alignment: Mild reversal of cervical lordosis. Trace anterolisthesis
C4 on C5 and C5 on C6 without change. Facet alignment maintained

Skull base and vertebrae: No acute fracture. No primary bone lesion
or focal pathologic process. Incomplete fusion posterior arch of C1

Soft tissues and spinal canal: No prevertebral fluid or swelling. No
visible canal hematoma.

Disc levels: Multilevel degenerative change, most advanced at C4-C5
and C6-C7. Facet degenerative changes at multiple levels.

Upper chest: Negative.

Other: None
IMPRESSION: 1. No CT evidence for acute intracranial abnormality. Atrophy and
chronic small vessel ischemic changes of the white matter.
2. Mild reversal of cervical lordosis with degenerative change. No
acute osseous abnormality

## 2021-09-22 ENCOUNTER — Ambulatory Visit: Payer: Medicare Other | Admitting: Family

## 2021-09-22 ENCOUNTER — Telehealth: Payer: Self-pay | Admitting: Family

## 2021-09-22 NOTE — Telephone Encounter (Signed)
Patient did not show for her [facility] appointment on 09/22/21. Will attempt to reschedule.

## 2021-10-14 ENCOUNTER — Other Ambulatory Visit (INDEPENDENT_AMBULATORY_CARE_PROVIDER_SITE_OTHER): Payer: Self-pay | Admitting: Vascular Surgery

## 2021-10-14 DIAGNOSIS — I6523 Occlusion and stenosis of bilateral carotid arteries: Secondary | ICD-10-CM

## 2021-10-16 NOTE — Progress Notes (Deleted)
MRN : [id number]  Melanie King is a 76 y.o. (Aug 10, 1945) female who presents with chief complaint of check carotid arteries.  History of Present Illness:   The patient is seen for evaluation of carotid stenosis. The carotid stenosis was identified after a duplex ultrasound.   The patient denies amaurosis fugax. There is no recent history of TIA symptoms or focal motor deficits. There is no prior documented CVA.   She is c/o severe pain in the left side and was very uncomfortable on the exam table.   There is no history of migraine headaches. There is no history of seizures.   The patient is taking enteric-coated aspirin 81 mg daily.   The patient has a history of coronary artery disease, no recent episodes of angina or shortness of breath. The patient denies PAD or claudication symptoms. There is a history of hyperlipidemia which is being treated with a statin.    Patient did undergo a CT with contrast of the head and neck in May 2021 which reports a 70% left ICA stenosis.   CT of the head dated December 30, 2019 demonstrates bilateral small lacunar infarcts unchanged from previous scans no remote large left hemispheric event noted.   Duplex ultrasound of the carotid arteries shows RICA 1-39% and LICA 40-59%    No outpatient medications have been marked as taking for the 10/17/21 encounter (Appointment) with Gilda Crease, Latina Craver, MD.    Past Medical History:  Diagnosis Date   Arthritis    CHF (congestive heart failure) (HCC)    COPD (chronic obstructive pulmonary disease) (HCC)    Coronary arteriosclerosis    DDD (degenerative disc disease), cervical    DDD (degenerative disc disease), lumbar    Depression    Diabetes mellitus without complication (HCC)    GERD (gastroesophageal reflux disease)    Hyperlipidemia    Hypertension    Osteoarthritis    Pacemaker    Tremor     Past Surgical History:  Procedure Laterality Date   ABDOMINAL HYSTERECTOMY     BREAST  BIOPSY Right 1994   neg cyst removed   CATARACT EXTRACTION W/PHACO Right 08/17/2020   Procedure: CATARACT EXTRACTION PHACO AND INTRAOCULAR LENS PLACEMENT (IOC) RIGHT DIABETIC 8.90 00:52.9;  Surgeon: Galen Manila, MD;  Location: [facility];  Service: Ophthalmology;  Laterality: Right;  Diabetic   CHOLECYSTECTOMY     EYE SURGERY  1964, 1966, and 1967   bilateral   FOOT SURGERY Right    cellulitis   PACEMAKER INSERTION     PPM GENERATOR CHANGEOUT N/A 02/24/2020   Procedure: PPM GENERATOR CHANGEOUT;  Surgeon: Marcina Millard, MD;  Location: [facility];  Service: Cardiovascular;  Laterality: N/A;   SPINE SURGERY     cyst removed; [facility]    Social History Social History   Tobacco Use   Smoking status: Every Day    Packs/day: 1.00    Years: 50.00    Total pack years: 50.00    Types: Cigarettes   Smokeless tobacco: Never  Vaping Use   Vaping Use: Every day  Substance Use Topics   Alcohol use: No   Drug use: No    Family History Family History  Problem Relation Age of Onset   Breast cancer Maternal Aunt 40   Cancer Mother        colon   Aneurysm Father     Allergies  Allergen Reactions   Alprazolam Swelling   Fentanyl Other (See Comments)    "  burning and hot"   Meperidine Itching    Other reaction(s): Other (See Comments)   Meperidine Hcl    Ranitidine Hcl     Other reaction(s): Other (See Comments)     REVIEW OF SYSTEMS (Negative unless checked)  Constitutional: [] Weight loss  [] Fever  [] Chills Cardiac: [] Chest pain   [] Chest pressure   [] Palpitations   [] Shortness of breath when laying flat   [] Shortness of breath with exertion. Vascular:  [x] Pain in legs with walking   [] Pain in legs at rest  [] History of DVT   [] Phlebitis   [] Swelling in legs   [] Varicose veins   [] Non-healing ulcers Pulmonary:   [] Uses home oxygen   [] Productive cough   [] Hemoptysis   [] Wheeze  [x] COPD   [] Asthma Neurologic:  [] Dizziness   [] Seizures    [] History of stroke   [] History of TIA  [] Aphasia   [] Vissual changes   [] Weakness or numbness in arm   [] Weakness or numbness in leg Musculoskeletal:   [] Joint swelling   [x] Joint pain   [] Low back pain Hematologic:  [] Easy bruising  [] Easy bleeding   [] Hypercoagulable state   [] Anemic Gastrointestinal:  [] Diarrhea   [] Vomiting  [] Gastroesophageal reflux/heartburn   [] Difficulty swallowing. Genitourinary:  [] Chronic kidney disease   [] Difficult urination  [] Frequent urination   [] Blood in urine Skin:  [] Rashes   [] Ulcers  Psychological:  [] History of anxiety   []  History of major depression.  Physical Examination  There were no vitals filed for this visit. There is no height or weight on file to calculate BMI. Gen: WD/WN, NAD Head: [place]/AT, No temporalis wasting.  Ear/Nose/Throat: Hearing grossly intact, nares w/o erythema or drainage Eyes: PER, EOMI, sclera nonicteric.  Neck: Supple, no masses.  No bruit or JVD.  Pulmonary:  Good air movement, no audible wheezing, no use of accessory muscles.  Cardiac: RRR, normal S1, S2, no Murmurs. Vascular:  carotid bruit noted Vessel Right Left  Radial Palpable Palpable  Carotid  Palpable  Palpable  Subclav  Palpable Palpable  Gastrointestinal: soft, non-distended. No guarding/no peritoneal signs.  Musculoskeletal: M/S 5/5 throughout.  No visible deformity.  Neurologic: CN 2-12 intact. Pain and light touch intact in extremities.  Symmetrical.  Speech is fluent. Motor exam as listed above. Psychiatric: Judgment intact, Mood & affect appropriate for pt's clinical situation. Dermatologic: No rashes or ulcers noted.  No changes consistent with cellulitis.   CBC Lab Results  Component Value Date   WBC 10.0 08/13/2021   HGB 12.7 08/13/2021   HCT 39.9 08/13/2021   MCV 93.0 08/13/2021   PLT 313 08/13/2021    BMET    Component Value Date/Time   NA 137 08/13/2021 1249   NA 143 04/02/2017 1508   NA 136 06/20/2013 0938   K 4.5 08/13/2021 1249    K 4.7 06/20/2013 0938   CL 104 08/13/2021 1249   CL 102 06/20/2013 0938   CO2 24 08/13/2021 1249   CO2 33 (H) 06/20/2013 0938   GLUCOSE 143 (H) 08/13/2021 1249   GLUCOSE 115 (H) 06/20/2013 0938   BUN 35 (H) 08/13/2021 1249   BUN 18 04/02/2017 1508   BUN 22 (H) 06/20/2013 0938   CREATININE 1.58 (H) 08/13/2021 1249   CREATININE 0.72 06/20/2013 0938   CALCIUM 9.9 08/13/2021 1249   CALCIUM 9.1 06/20/2013 0938   GFRNONAA 34 (L) 08/13/2021 1249   GFRNONAA >60 06/20/2013 0938   GFRAA 52 (L) 09/17/2019 0847   GFRAA >60 06/20/2013 0938   CrCl cannot be calculated (  Patient's most recent lab result is older than the maximum 21 days allowed.).  COAG Lab Results  Component Value Date   INR 1.1 02/13/2021   INR 1.04 05/10/2017    Radiology No results found.   Assessment/Plan There are no diagnoses linked to this encounter.   Levora Dredge, MD  10/16/2021 10:47 AM

## 2021-10-17 ENCOUNTER — Ambulatory Visit (INDEPENDENT_AMBULATORY_CARE_PROVIDER_SITE_OTHER): Payer: Medicare Other | Admitting: Vascular Surgery

## 2021-10-17 ENCOUNTER — Encounter (INDEPENDENT_AMBULATORY_CARE_PROVIDER_SITE_OTHER): Payer: Medicare Other

## 2021-10-17 DIAGNOSIS — I6523 Occlusion and stenosis of bilateral carotid arteries: Secondary | ICD-10-CM

## 2021-10-17 DIAGNOSIS — I10 Essential (primary) hypertension: Secondary | ICD-10-CM

## 2021-10-17 DIAGNOSIS — J449 Chronic obstructive pulmonary disease, unspecified: Secondary | ICD-10-CM

## 2021-10-17 DIAGNOSIS — E782 Mixed hyperlipidemia: Secondary | ICD-10-CM

## 2021-10-17 DIAGNOSIS — I2510 Atherosclerotic heart disease of native coronary artery without angina pectoris: Secondary | ICD-10-CM

## 2021-12-04 IMAGING — CT CT HEAD W/O CM
3 series · 15 of 47 positions shown, 18 images · non-contrast
Comparison: CT head dated 10/12/2020.

CLINICAL DATA: Altered mental status.

EXAM:
CT HEAD WITHOUT CONTRAST
TECHNIQUE: Contiguous axial images were obtained from the base of the skull
through the vertex without intravenous contrast.

[Series 2: head wo · axial · 0.44mm/px · z∈[+269,+404]mm · 9 of 33 slices shown, 12 images]
[im 3/33  brain]
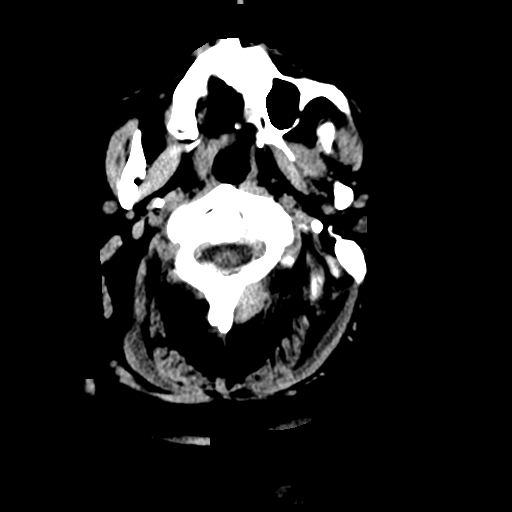
[im 3/33  bone]
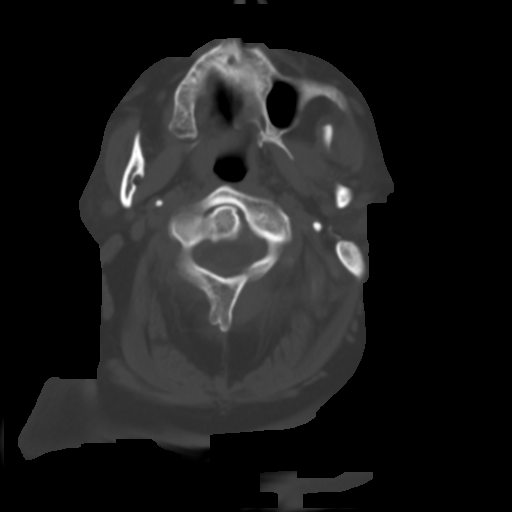
[im 6/33  brain]
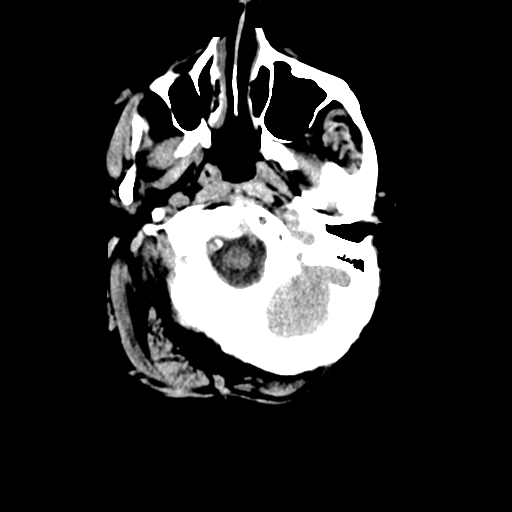
[im 9/33  brain]
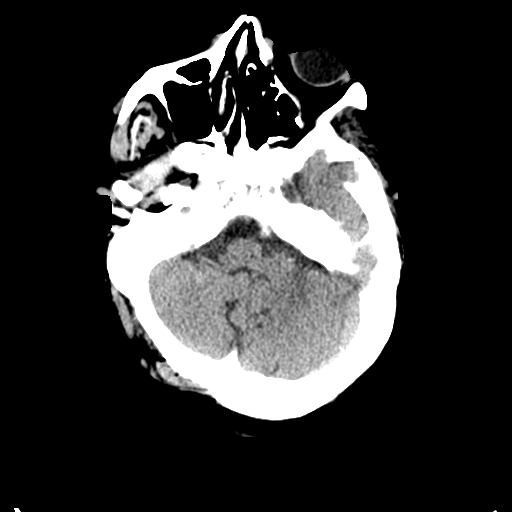
[im 13/33  brain]
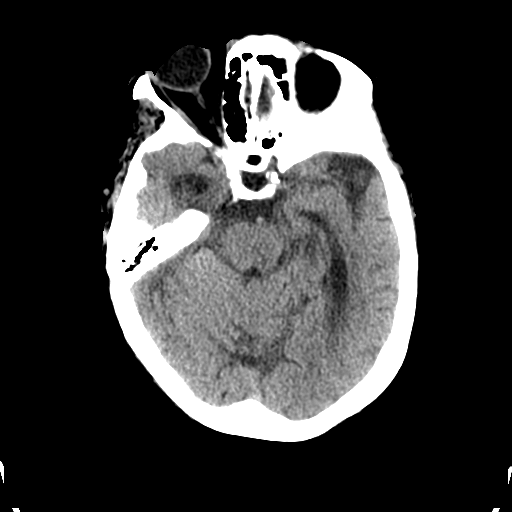
[im 17/33  brain]
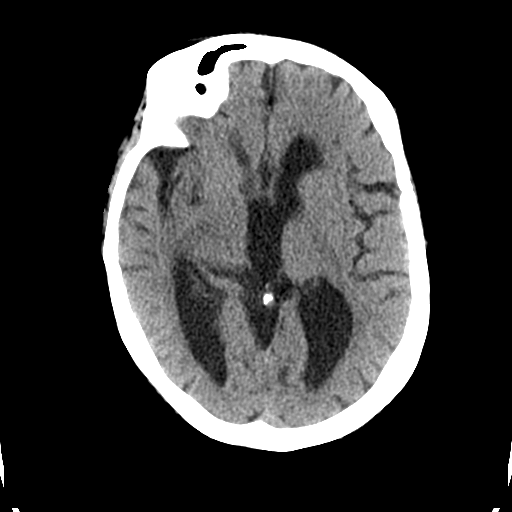
[im 17/33  bone]
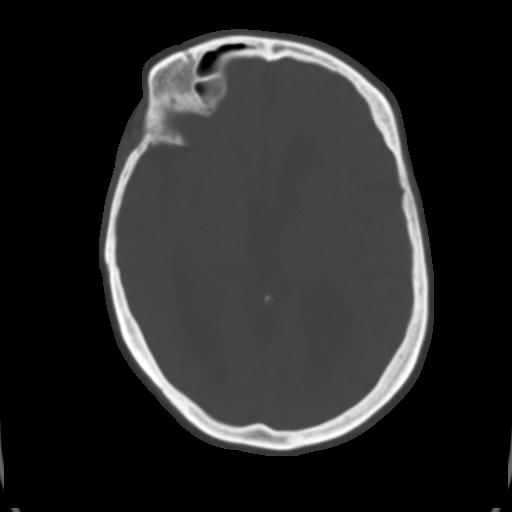
[im 20/33  brain]
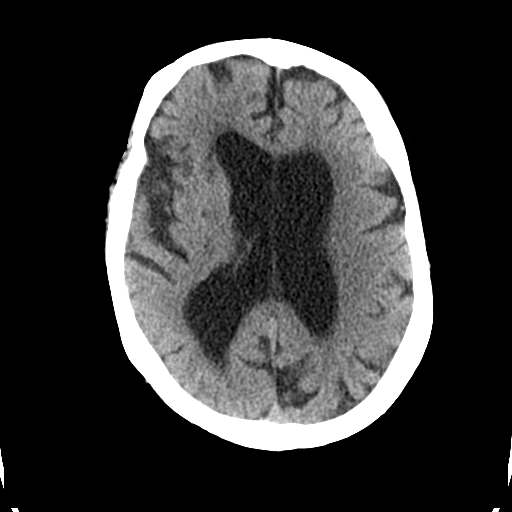
[im 24/33  brain]
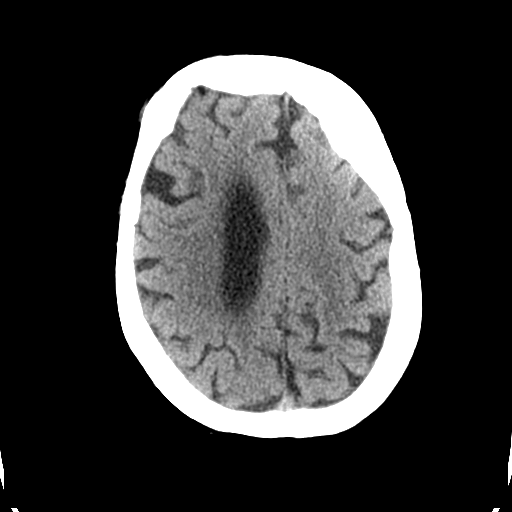
[im 27/33  brain]
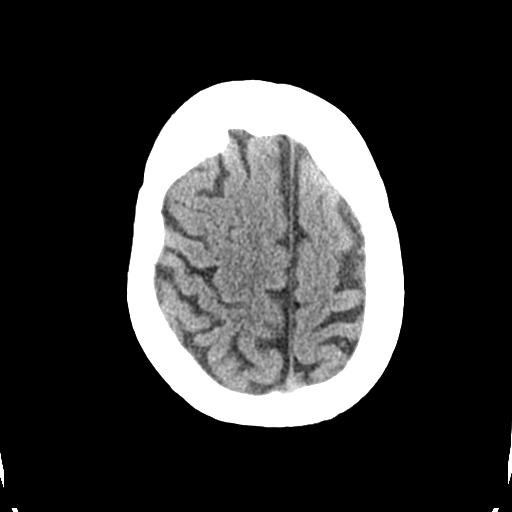
[im 30/33  brain]
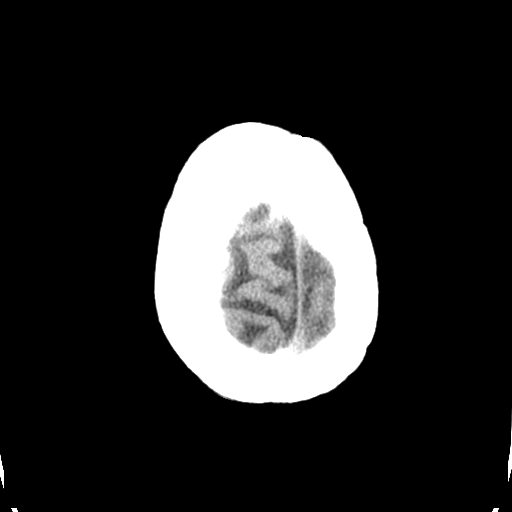
[im 30/33  bone]
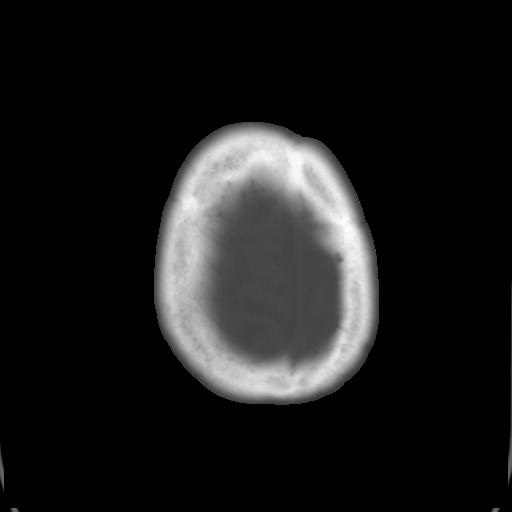

[Series 4: coronal soft tissue · coronal · 0.33mm/px · 3 of 84 slices shown]
[im 28/84  brain]
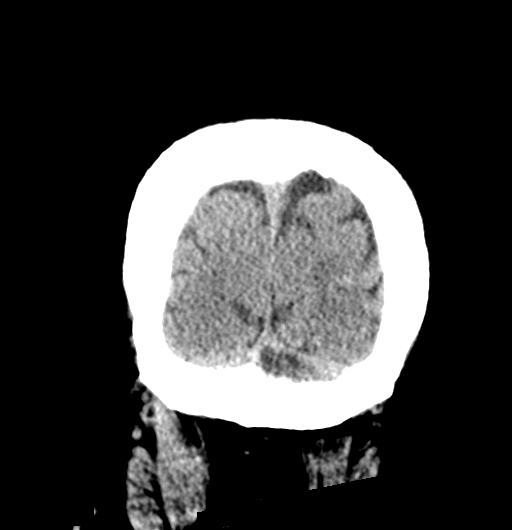
[im 37/84  brain]
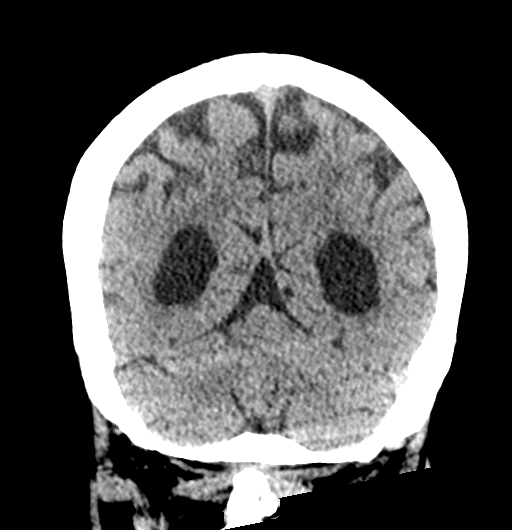
[im 47/84  brain]
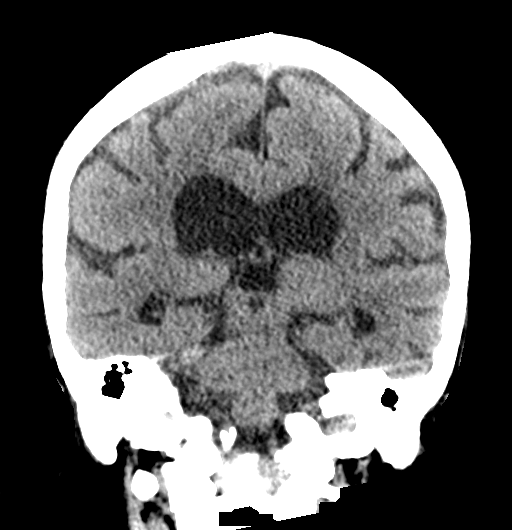

[Series 5: sagittal soft tissue · sagittal · 0.34mm/px · 3 of 57 slices shown]
[im 23/57  brain]
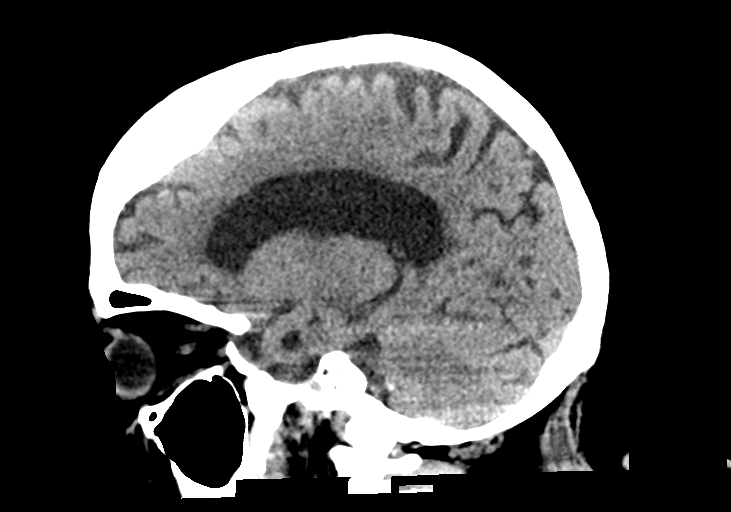
[im 29/57  brain]
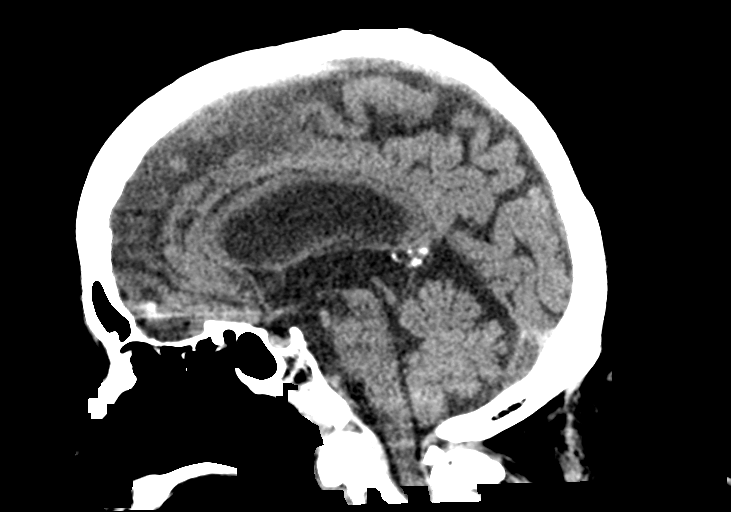
[im 34/57  brain]
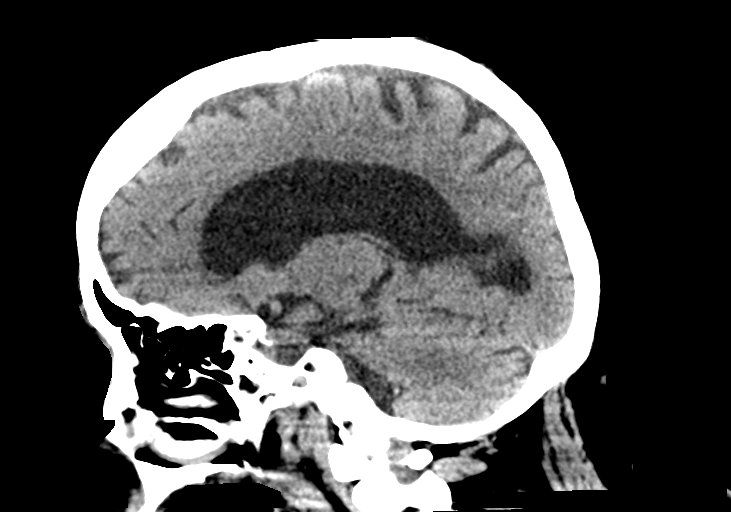

[15 of 47 positions shown; findings below may reference images not displayed]

FINDINGS: Brain: No evidence of acute infarction, hemorrhage, hydrocephalus,
extra-axial collection or mass lesion/mass effect. There is moderate
cerebral volume loss with associated ex vacuo dilatation.
Periventricular white matter hypoattenuation likely represents
chronic small vessel ischemic disease.

Vascular: There are vascular calcifications in the carotid siphons.

Skull: Normal. Negative for fracture or focal lesion.

Sinuses/Orbits: No acute finding.

Other: None.
IMPRESSION: No acute intracranial process.

## 2021-12-06 IMAGING — CR DG HIP (WITH OR WITHOUT PELVIS) 2-3V*R*
3 series · 3 of 3 positions shown · non-contrast
Comparison: Radiographs of the left hip 05/31/2019. Radiographs of
the right hip 03/20/2019.

CLINICAL DATA: Fall.  Right hip pain.

EXAM:
DG HIP (WITH OR WITHOUT PELVIS) 2-3V RIGHT

[pelvis ap]
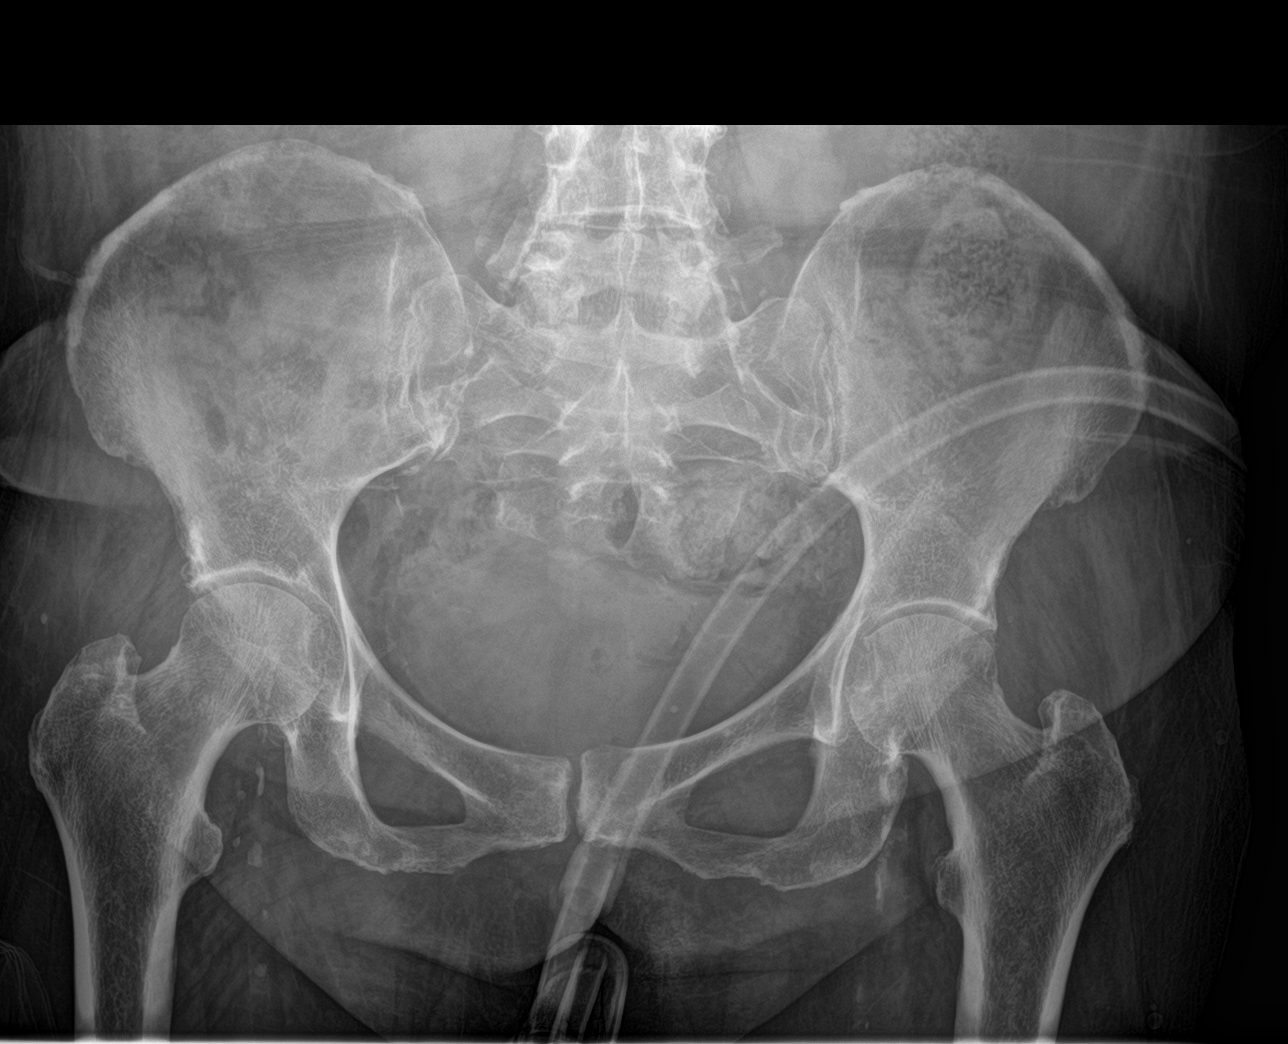

[hip ap]
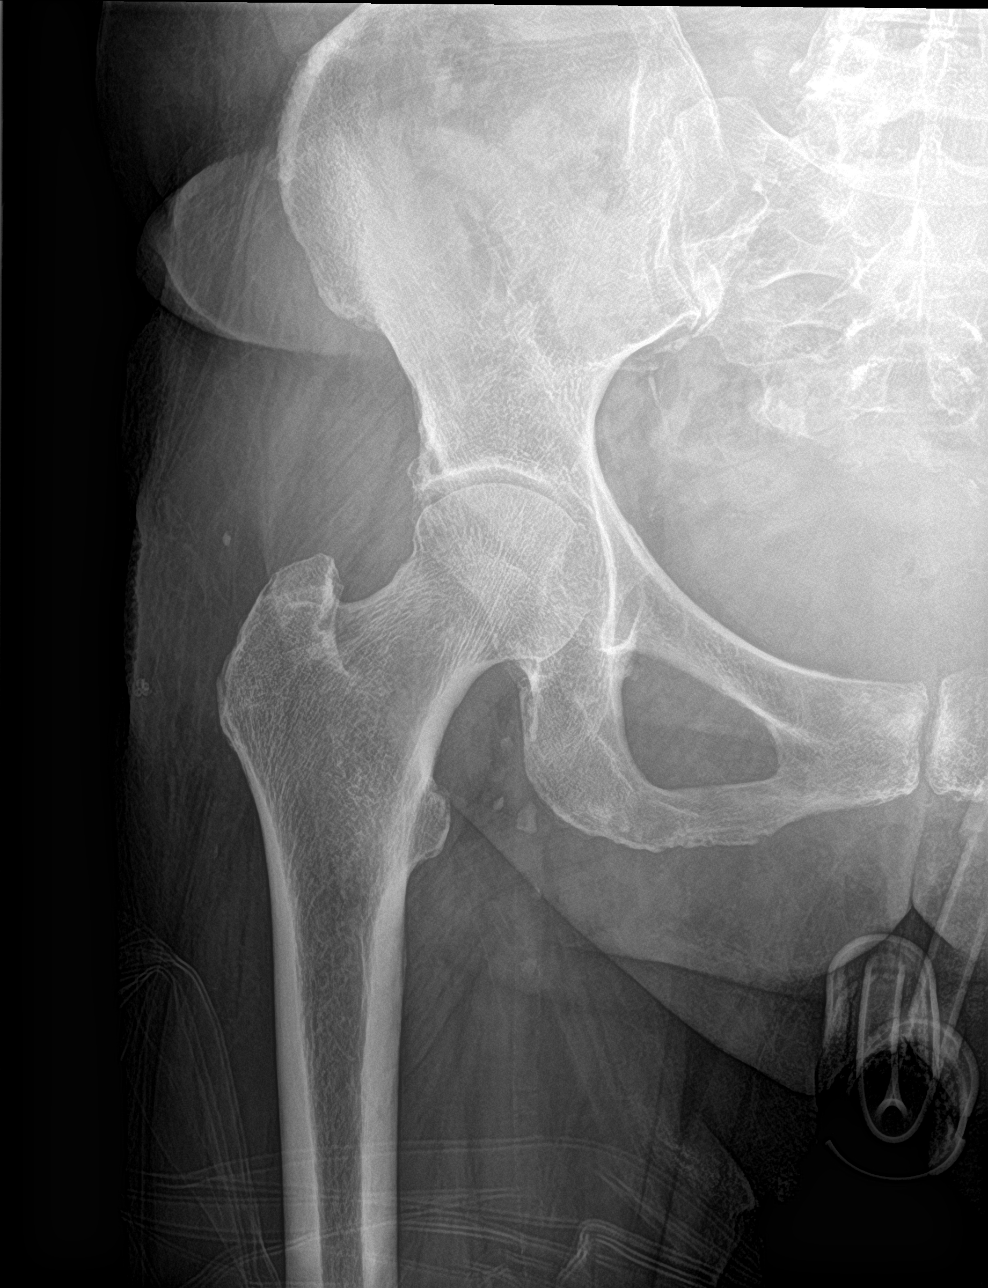

[hip lat]
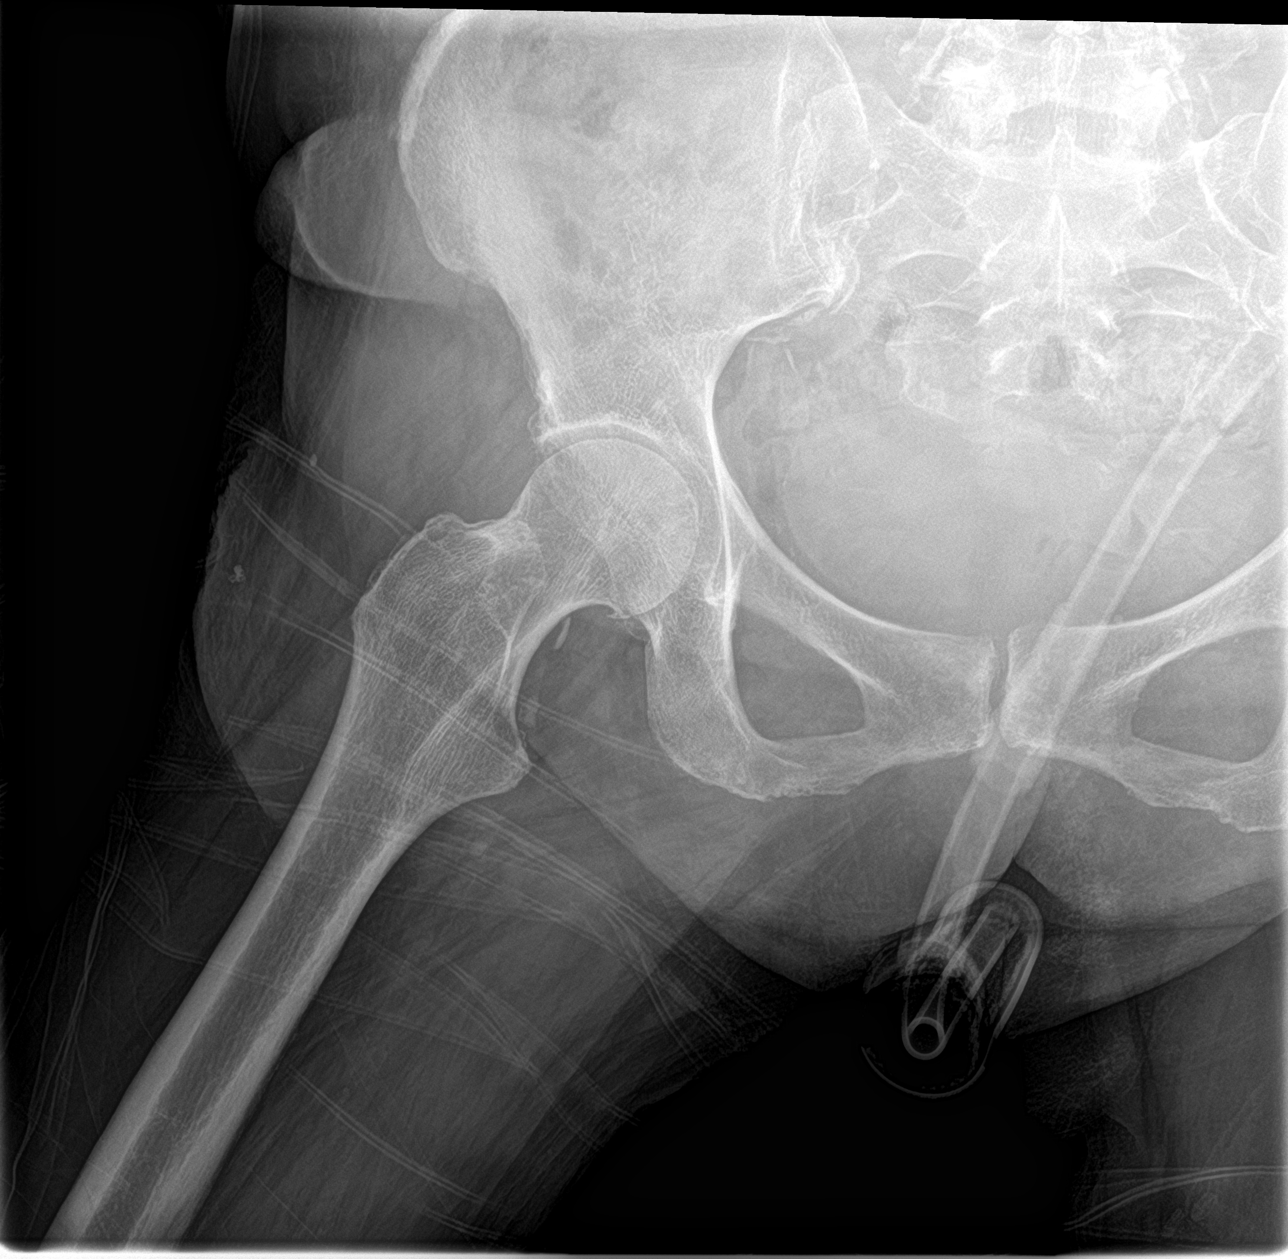

[3 of 3 positions shown; findings below may reference images not displayed]

FINDINGS: There is normal bony alignment.

No evidence of acute osseous or articular abnormality.

Mild right femoroacetabular joint degenerative arthrosis. Mild
degenerative changes are also present at the pubic symphysis.
IMPRESSION: No evidence of acute osseous or articular abnormality.

## 2021-12-06 IMAGING — CT CT CERVICAL SPINE W/O CM
3 of 8 series · 10 of 35 positions shown, 11 images · non-contrast
Comparison: Prior head CT examinations 12/26/2020 and earlier. CT
of the cervical spine 10/12/2020.

CLINICAL DATA: Head trauma, minor. Neck trauma. Additional history
provided: Mechanical fall, patient reports head pain, right shoulder
pain, mid back pain.

EXAM:
CT HEAD WITHOUT CONTRAST
CT CERVICAL SPINE WITHOUT CONTRAST
TECHNIQUE: Multidetector CT imaging of the head and cervical spine was
performed following the standard protocol without intravenous
contrast. Multiplanar CT image reconstructions of the cervical spine
were also generated.

[Series 10: sagittal bone · sagittal · 0.23mm/px · 6 of 73 slices shown]
[im 13/73  bone]
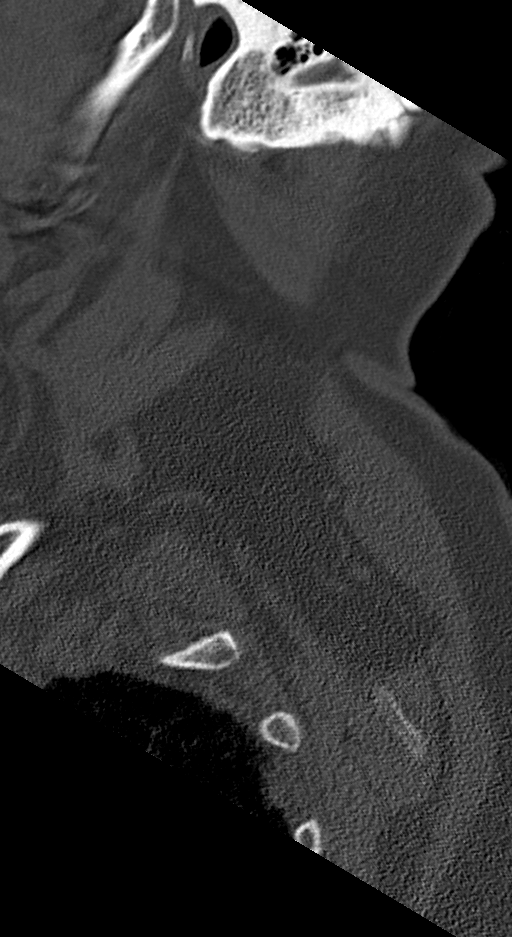
[im 25/73  bone]
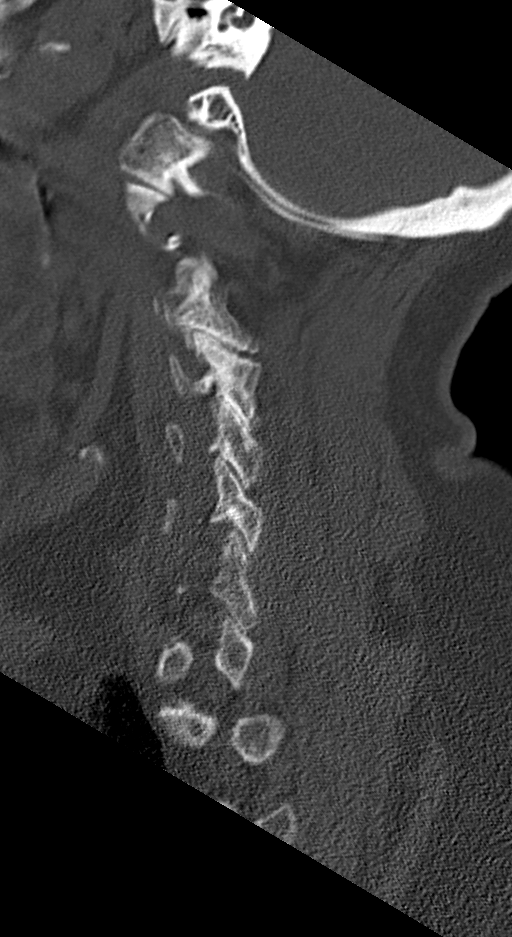
[im 32/73  soft-tissue]
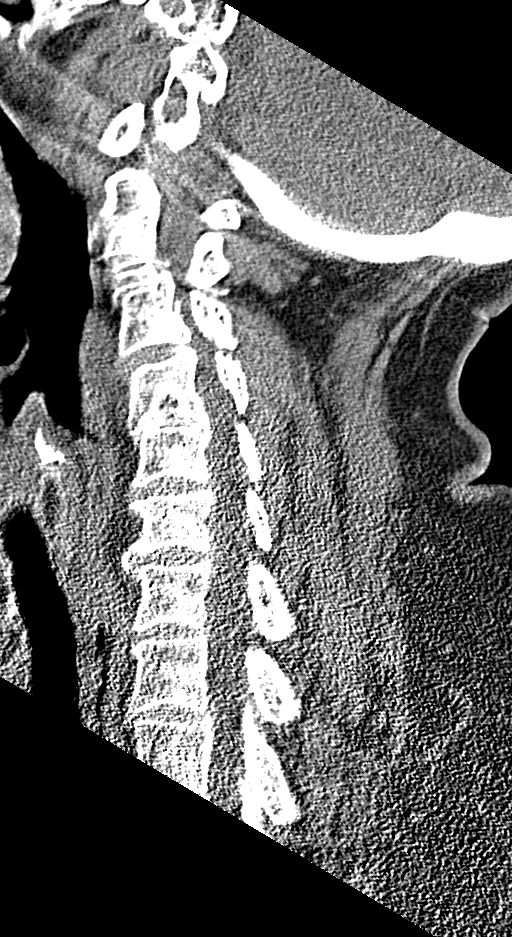
[im 37/73  bone]
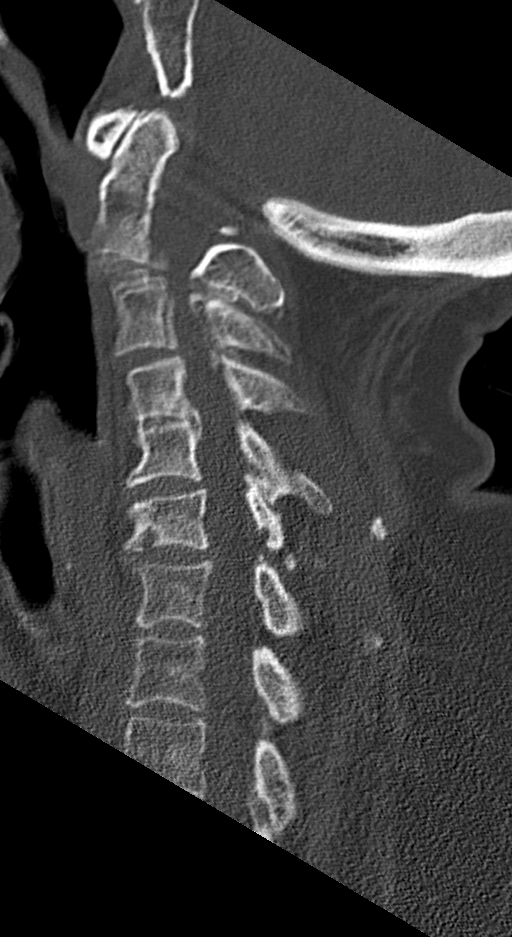
[im 49/73  bone]
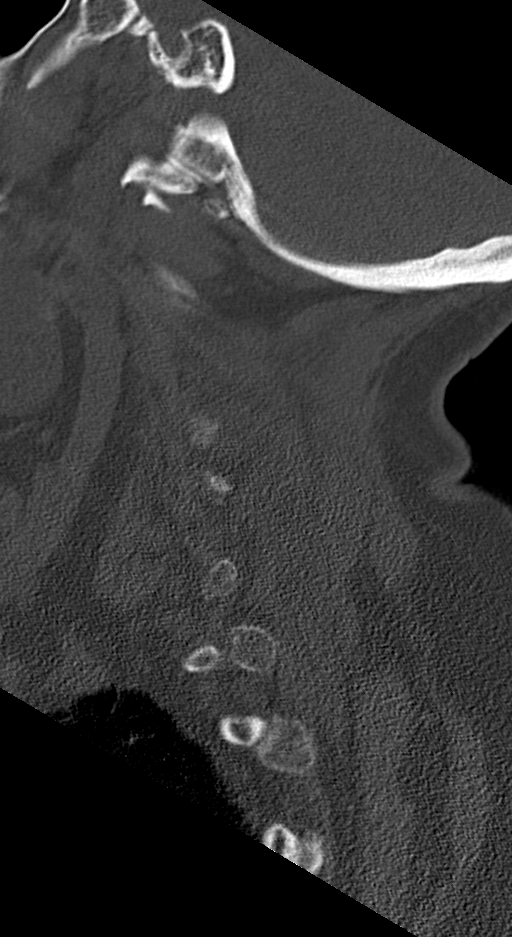
[im 61/73  bone]
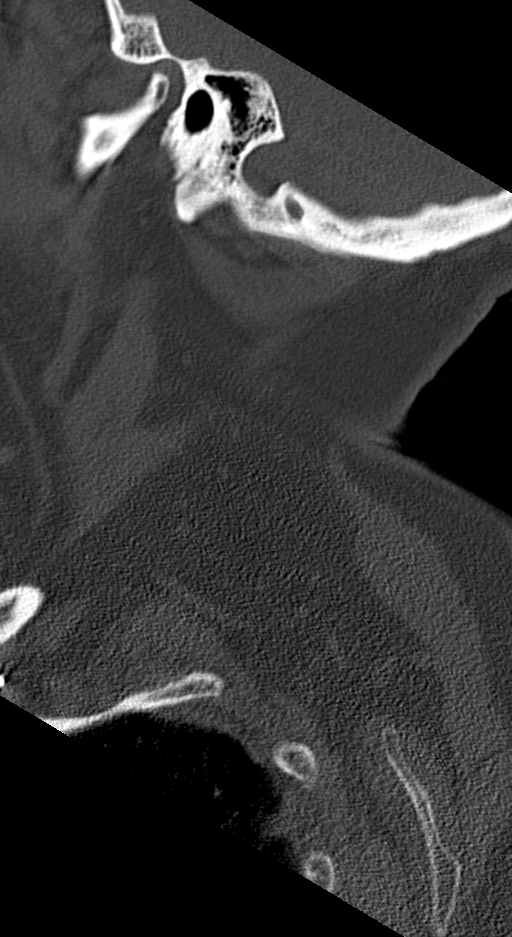

[Series 11: coronal bone · coronal · 0.29mm/px · 3 of 60 slices shown]
[im 11/60  bone]
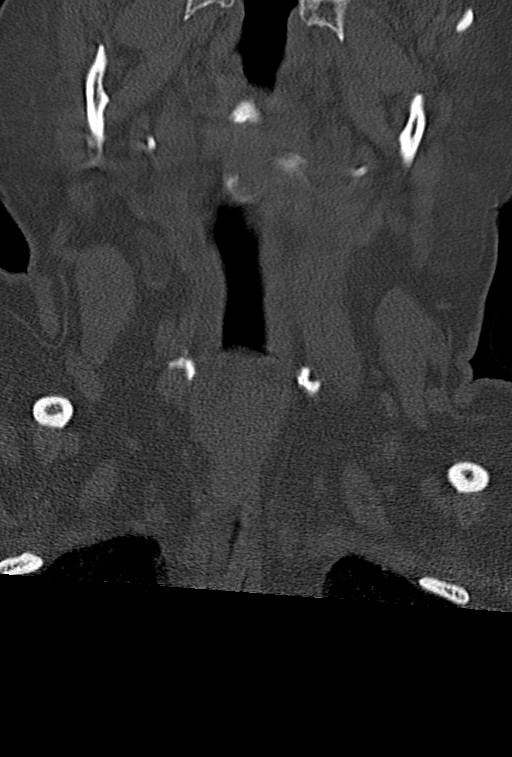
[im 35/60  bone]
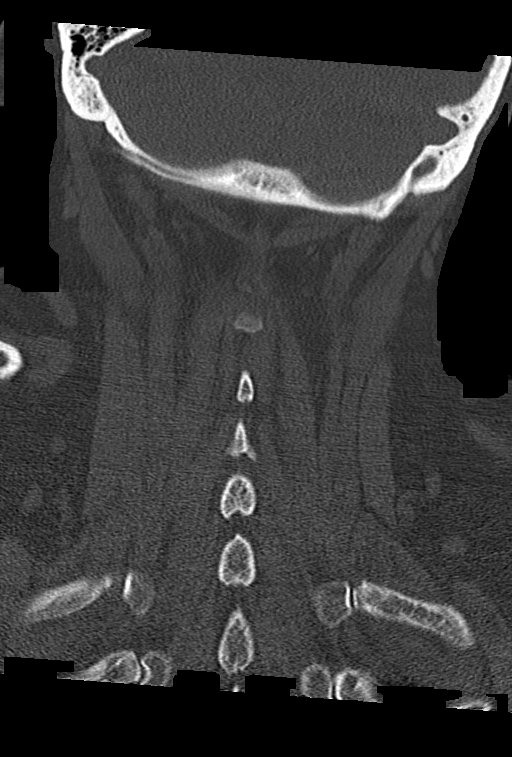
[im 59/60  bone]
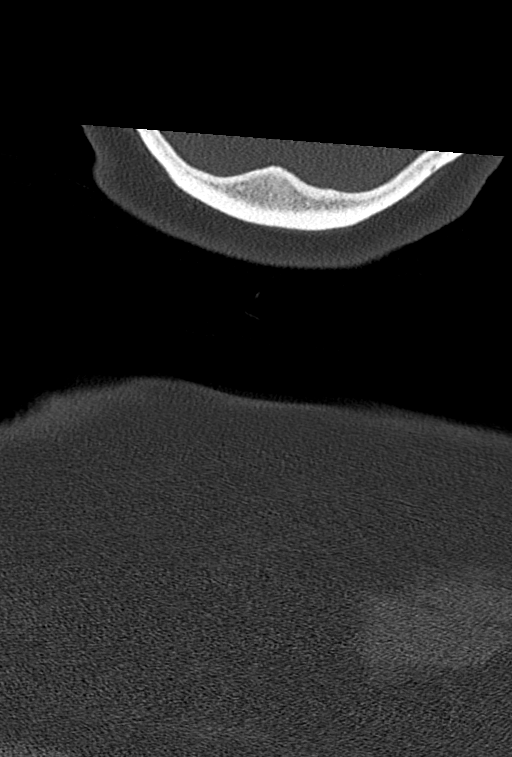

[Series 12: orthogonal bone · axial · 0.23mm/px · z∈[-27,-27]mm · 1 of 109 slices shown, 2 images]
[im 55/109  soft-tissue]
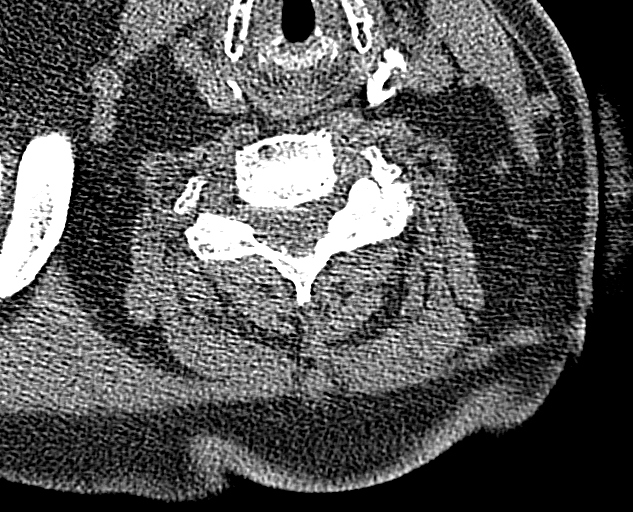
[im 55/109  bone]
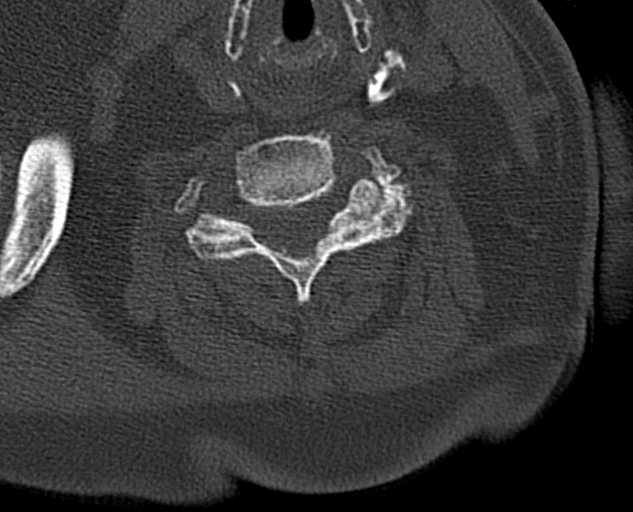

[10 of 35 positions shown; findings below may reference images not displayed]

FINDINGS: CT HEAD FINDINGS

Brain:

Generalized cerebral atrophy.

Redemonstrated small hypodense foci within the basal ganglia
bilaterally, likely reflecting a combination of chronic lacunar
infarcts and prominent perivascular spaces.

Background mild patchy and ill-defined hypoattenuation within the
cerebral white matter, nonspecific but compatible chronic small
vessel ischemic disease.

There is no acute intracranial hemorrhage.

No demarcated cortical infarct.

No extra-axial fluid collection.

No evidence of an intracranial mass.

No midline shift.

Vascular: No hyperdense vessel.  Atherosclerotic calcifications.

Skull: Normal. Negative for fracture or focal lesion.

Sinuses/Orbits: Visualized orbits show no acute finding. Trace
mucosal thickening within the left maxillary sinus at the imaged
levels. Minimal scattered mucosal thickening also present within the
bilateral ethmoid sinuses.

CT CERVICAL SPINE FINDINGS

Alignment: Reversal of the expected cervical lordosis. Mild cervical
dextrocurvature. No significant spondylolisthesis.

Skull base and vertebrae: The basion-dental and atlanto-dental
intervals are maintained.No evidence of acute fracture to the
cervical spine. Osseous fusion across the C4-C5 disc space. Also at
C4-C5, there is facet joint ankylosis on the left, and possibly also
on the right. Congenital nonunion of the posterior arch of C1.

Soft tissues and spinal canal: No prevertebral soft tissue swelling
or visible canal hematoma.

Disc levels: Cervical spondylosis with multilevel disc space
narrowing, disc bulges/central disc protrusions, endplate spurring,
uncovertebral hypertrophy and facet arthrosis. Multilevel spinal
canal stenosis. Most notably, there is suspected moderate or severe
spinal canal stenosis at C3-C4. Bony neural foraminal narrowing on
the right at C3-C4.

Upper chest: No consolidation within the imaged lung apices. No
visible pneumothorax.
IMPRESSION: CT head:

1. No evidence of acute intracranial abnormality.
2. Cerebral atrophy and chronic small vessel ischemic disease,
stable from the prior examination of 12/26/2020.
3. Mild paranasal sinus mucosal thickening at the imaged levels, as
described.

CT cervical spine:

1. No evidence of acute fracture to the cervical spine.
2. Reversal of the expected cervical lordosis.
3. Cervical dextrocurvature.
4. C4-C5 ankylosis.
5. Cervical spondylosis, as described. Notably, there is suspected
moderate or severe spinal canal stenosis at C3-C4. Bony neural
foraminal narrowing on the right at C3-C4.

## 2021-12-06 IMAGING — CT CT HEAD W/O CM
3 series · 14 of 47 positions shown, 16 images · non-contrast
Comparison: Prior head CT examinations 12/26/2020 and earlier. CT
of the cervical spine 10/12/2020.

CLINICAL DATA: Head trauma, minor. Neck trauma. Additional history
provided: Mechanical fall, patient reports head pain, right shoulder
pain, mid back pain.

EXAM:
CT HEAD WITHOUT CONTRAST
CT CERVICAL SPINE WITHOUT CONTRAST
TECHNIQUE: Multidetector CT imaging of the head and cervical spine was
performed following the standard protocol without intravenous
contrast. Multiplanar CT image reconstructions of the cervical spine
were also generated.

[Series 3: head wo · axial · 0.41mm/px · z∈[+78,+208]mm · 8 of 32 slices shown, 10 images]
[im 3/32  brain]
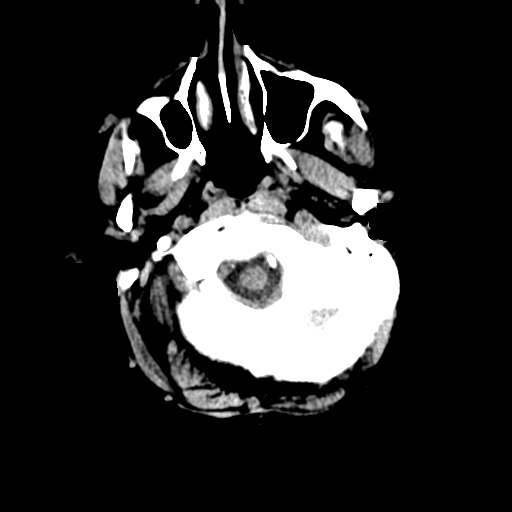
[im 3/32  bone]
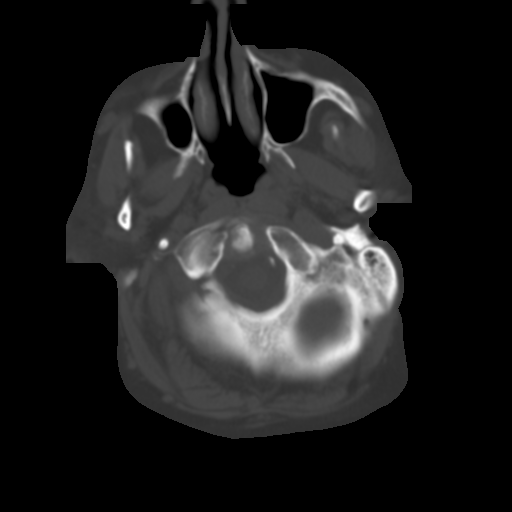
[im 7/32  brain]
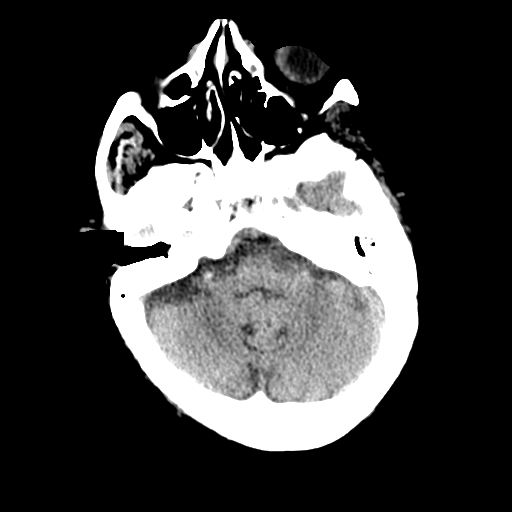
[im 10/32  brain]
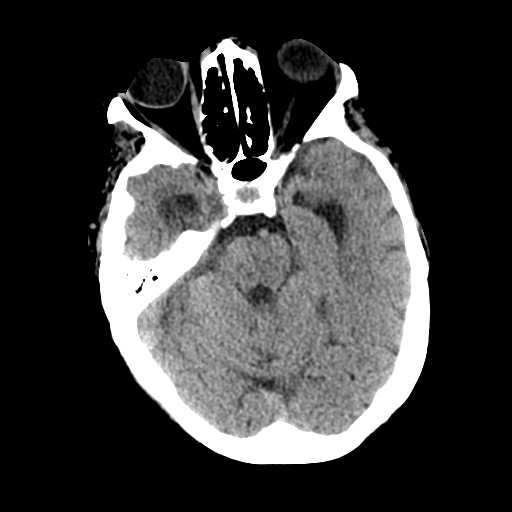
[im 14/32  brain]
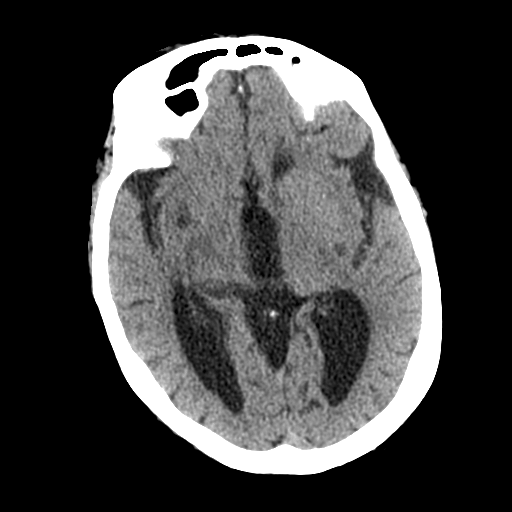
[im 18/32  brain]
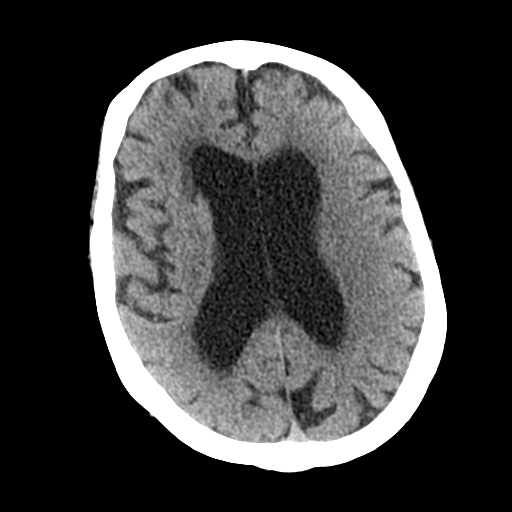
[im 18/32  bone]
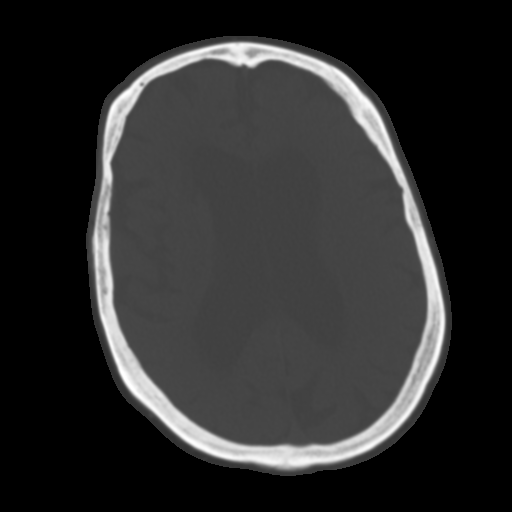
[im 22/32  brain]
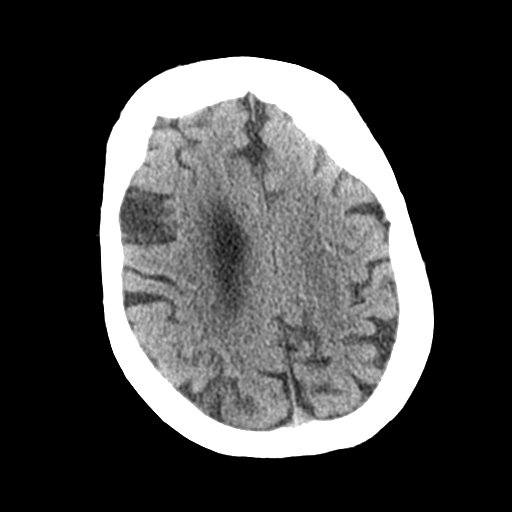
[im 25/32  brain]
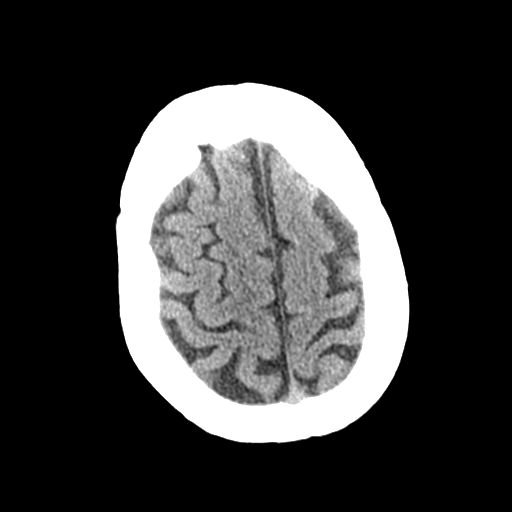
[im 29/32  brain]
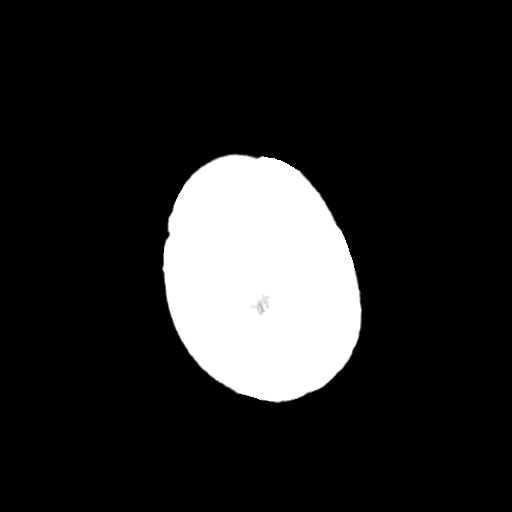

[Series 4: coronal soft tissue · coronal · 0.30mm/px · 3 of 67 slices shown]
[im 23/67  brain]
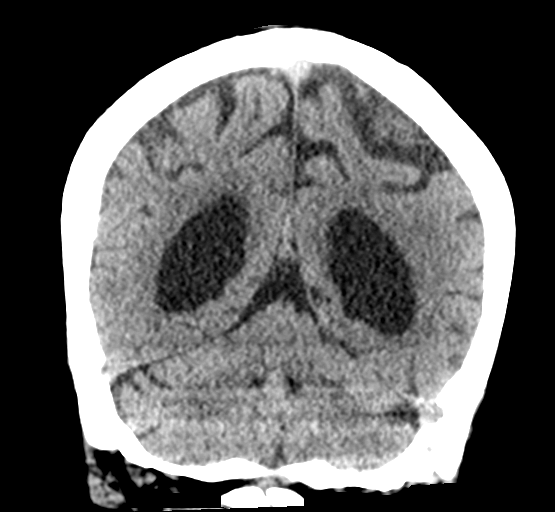
[im 30/67  brain]
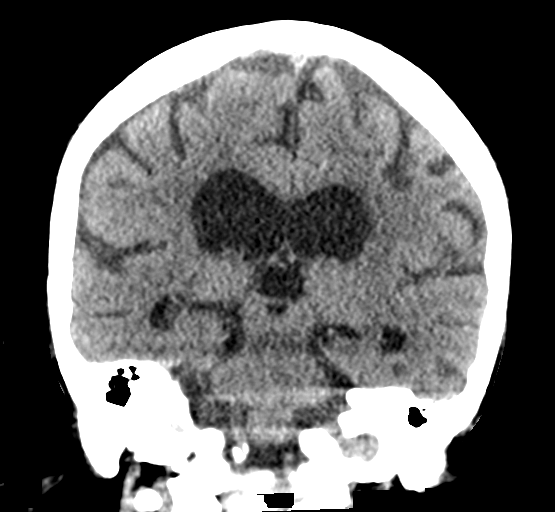
[im 37/67  brain]
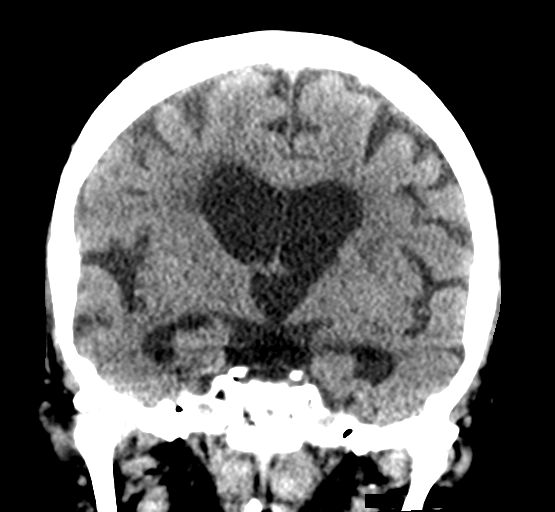

[Series 5: sagittal soft tissue · sagittal · 0.30mm/px · 3 of 57 slices shown]
[im 19/57  brain]
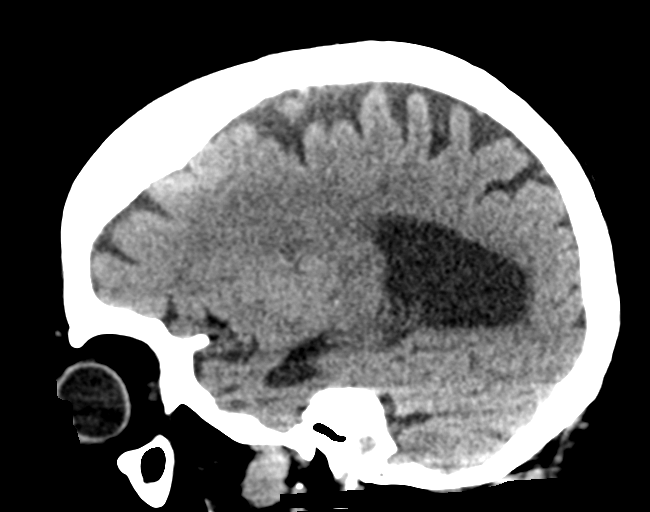
[im 29/57  brain]
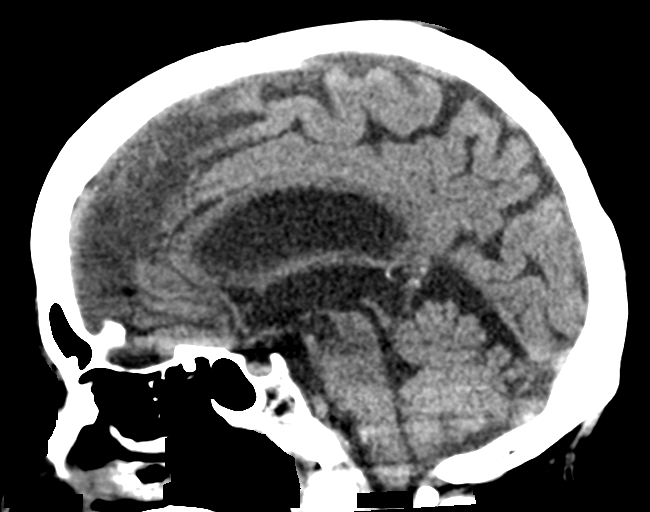
[im 38/57  brain]
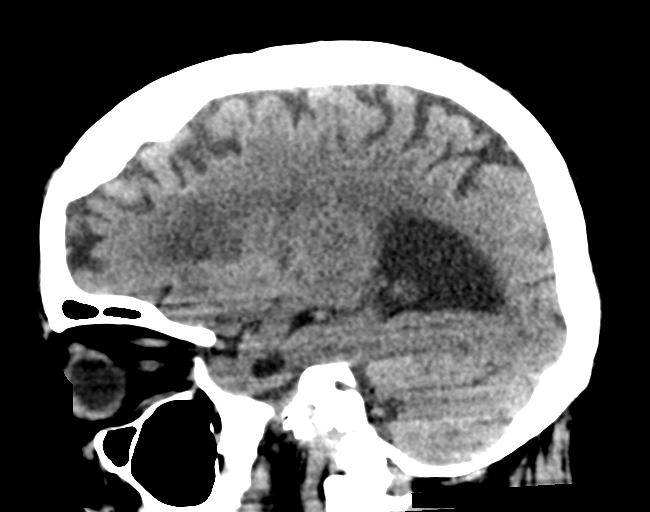

[14 of 47 positions shown; findings below may reference images not displayed]

FINDINGS: CT HEAD FINDINGS

Brain:

Generalized cerebral atrophy.

Redemonstrated small hypodense foci within the basal ganglia
bilaterally, likely reflecting a combination of chronic lacunar
infarcts and prominent perivascular spaces.

Background mild patchy and ill-defined hypoattenuation within the
cerebral white matter, nonspecific but compatible chronic small
vessel ischemic disease.

There is no acute intracranial hemorrhage.

No demarcated cortical infarct.

No extra-axial fluid collection.

No evidence of an intracranial mass.

No midline shift.

Vascular: No hyperdense vessel.  Atherosclerotic calcifications.

Skull: Normal. Negative for fracture or focal lesion.

Sinuses/Orbits: Visualized orbits show no acute finding. Trace
mucosal thickening within the left maxillary sinus at the imaged
levels. Minimal scattered mucosal thickening also present within the
bilateral ethmoid sinuses.

CT CERVICAL SPINE FINDINGS

Alignment: Reversal of the expected cervical lordosis. Mild cervical
dextrocurvature. No significant spondylolisthesis.

Skull base and vertebrae: The basion-dental and atlanto-dental
intervals are maintained.No evidence of acute fracture to the
cervical spine. Osseous fusion across the C4-C5 disc space. Also at
C4-C5, there is facet joint ankylosis on the left, and possibly also
on the right. Congenital nonunion of the posterior arch of C1.

Soft tissues and spinal canal: No prevertebral soft tissue swelling
or visible canal hematoma.

Disc levels: Cervical spondylosis with multilevel disc space
narrowing, disc bulges/central disc protrusions, endplate spurring,
uncovertebral hypertrophy and facet arthrosis. Multilevel spinal
canal stenosis. Most notably, there is suspected moderate or severe
spinal canal stenosis at C3-C4. Bony neural foraminal narrowing on
the right at C3-C4.

Upper chest: No consolidation within the imaged lung apices. No
visible pneumothorax.
IMPRESSION: CT head:

1. No evidence of acute intracranial abnormality.
2. Cerebral atrophy and chronic small vessel ischemic disease,
stable from the prior examination of 12/26/2020.
3. Mild paranasal sinus mucosal thickening at the imaged levels, as
described.

CT cervical spine:

1. No evidence of acute fracture to the cervical spine.
2. Reversal of the expected cervical lordosis.
3. Cervical dextrocurvature.
4. C4-C5 ankylosis.
5. Cervical spondylosis, as described. Notably, there is suspected
moderate or severe spinal canal stenosis at C3-C4. Bony neural
foraminal narrowing on the right at C3-C4.

## 2021-12-06 IMAGING — CR DG RIBS W/ CHEST 3+V*R*
4 series · 4 of 4 positions shown · non-contrast
Comparison: Chest radiograph 08/30/2020

CLINICAL DATA: Fall, right rib pain

EXAM:
RIGHT RIBS AND CHEST - 3+ VIEW

[chest pa]
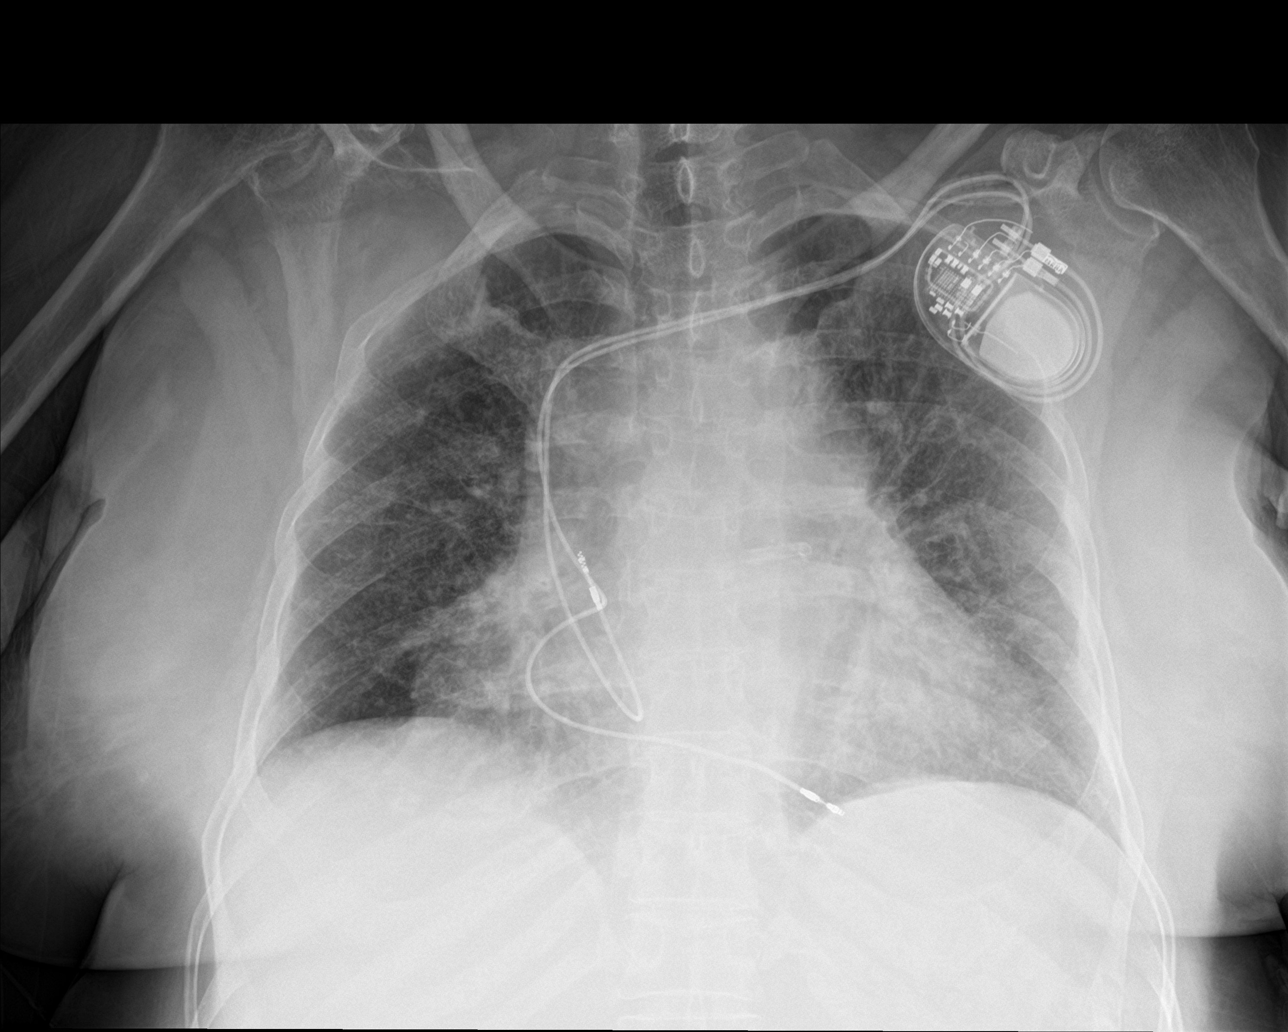

[rib ap (1 of 2)]
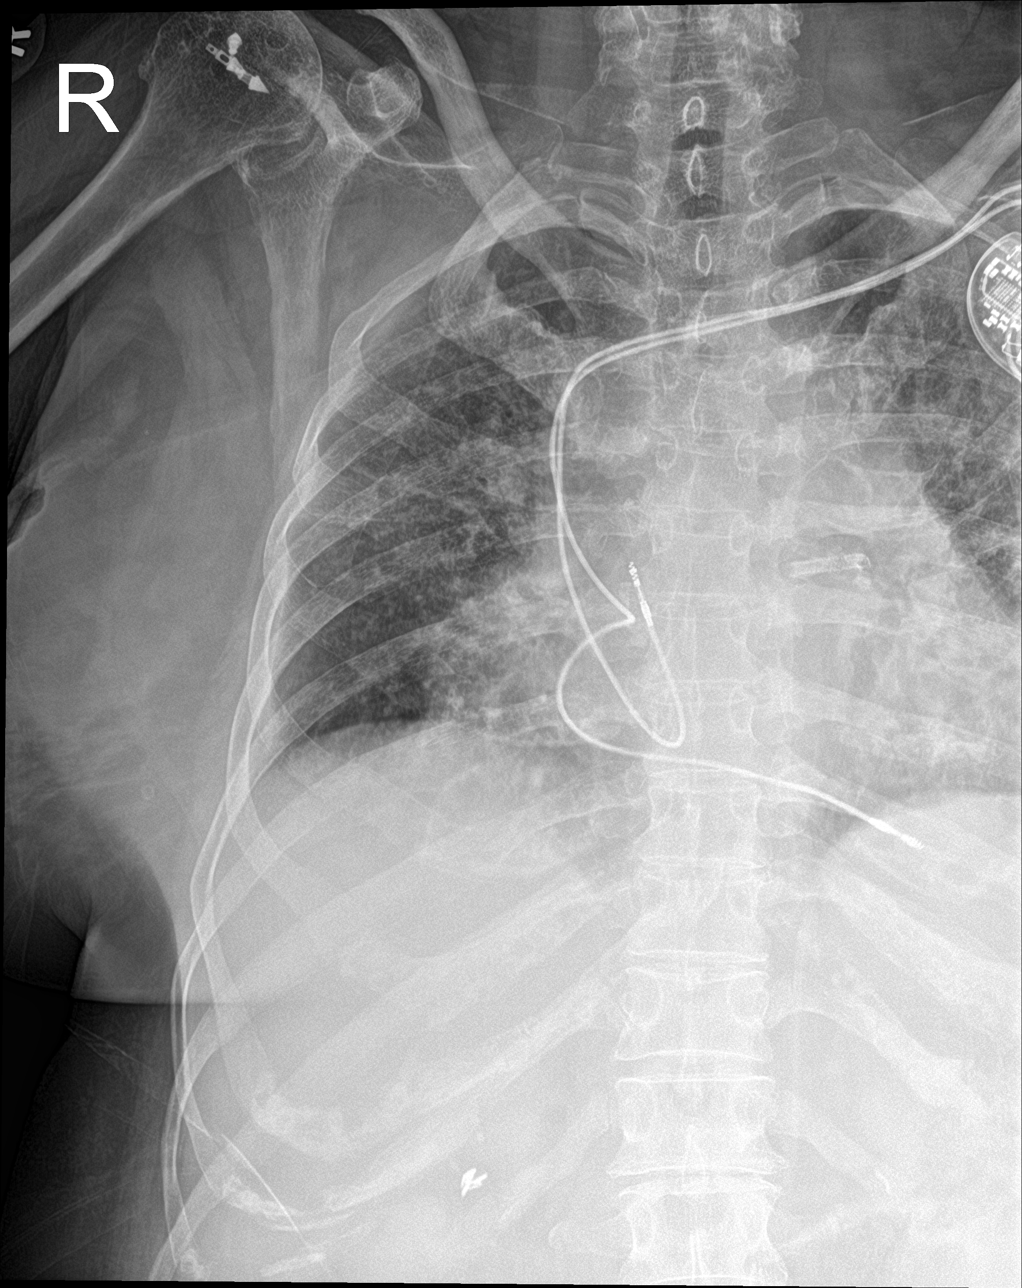

[rib ap obl]
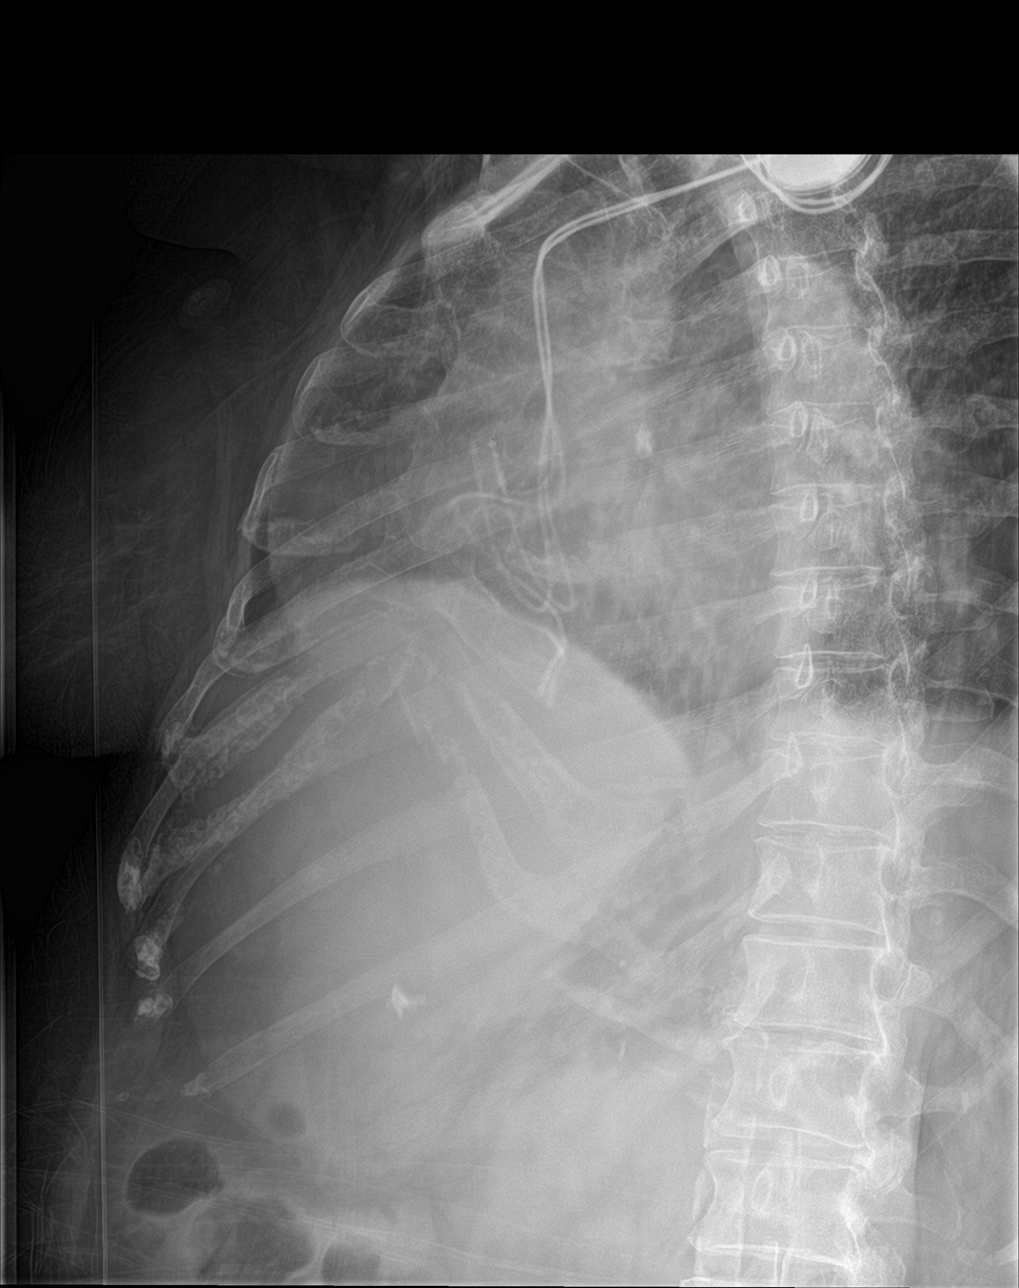

[rib ap (2 of 2)]
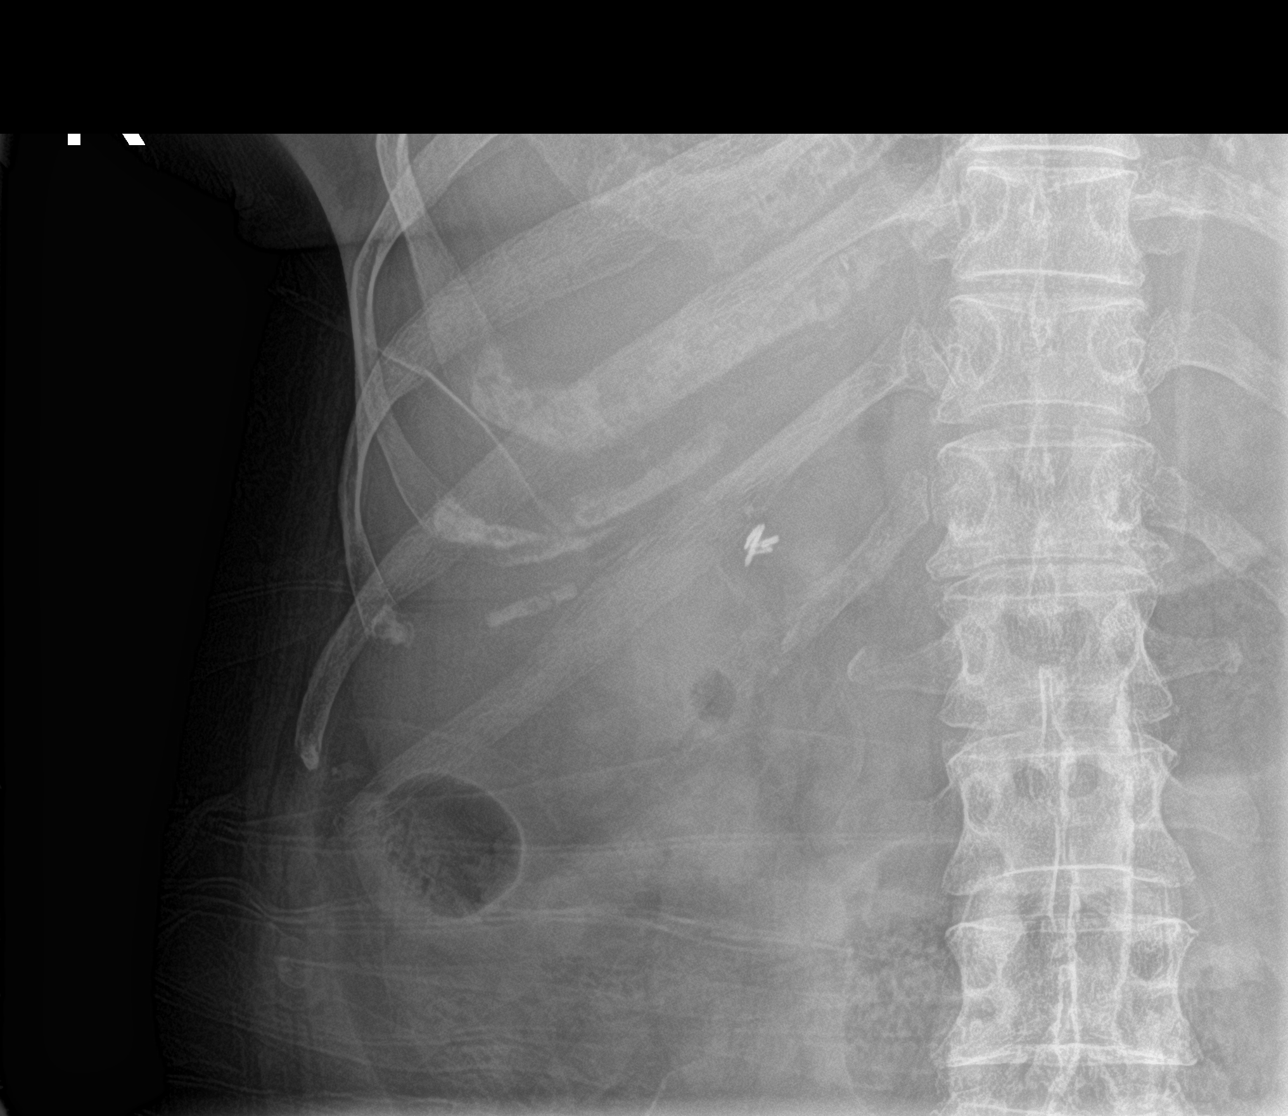

[4 of 4 positions shown; findings below may reference images not displayed]

FINDINGS: A left chest wall cardiac device and associated leads are stable.
The heart is enlarged, grossly unchanged. The mediastinal contours
are within normal limits.

Lung volumes are low. Increased interstitial markings throughout
both lungs likely reflecting vascular congestion without overt
pulmonary interstitial edema. There is no focal consolidation. There
is no pleural effusion or pneumothorax.

No rib fracture is identified.
IMPRESSION: 1. No rib fracture identified.
2. Cardiomegaly with vascular congestion but no overt pulmonary
edema.

## 2021-12-14 IMAGING — CT CT CERVICAL SPINE W/O CM
2 series · 14 of 29 positions shown, 18 images · non-contrast
Comparison: 12/28/2020

CLINICAL DATA: Trauma

EXAM:
CT CERVICAL SPINE WITHOUT CONTRAST
TECHNIQUE: Multidetector CT imaging of the cervical spine was performed without
intravenous contrast. Multiplanar CT image reconstructions were also
generated.

[Series 3: c spine soft · axial · 0.35mm/px · z∈[-138,-6]mm · 9 of 80 slices shown, 12 images]
[im 7/80  soft-tissue]
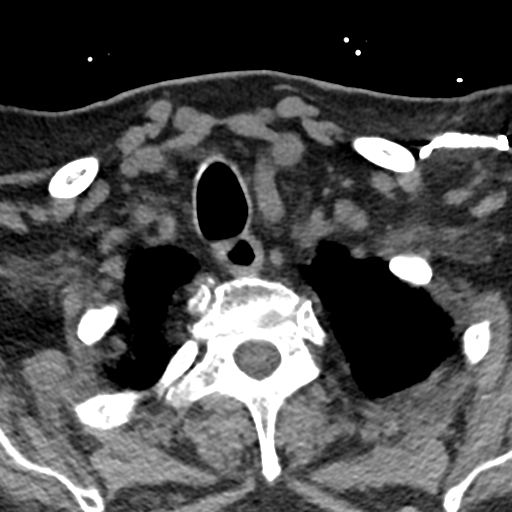
[im 7/80  bone]
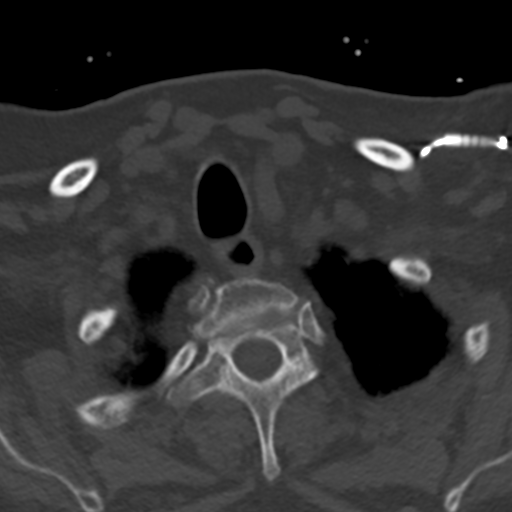
[im 19/80  bone]
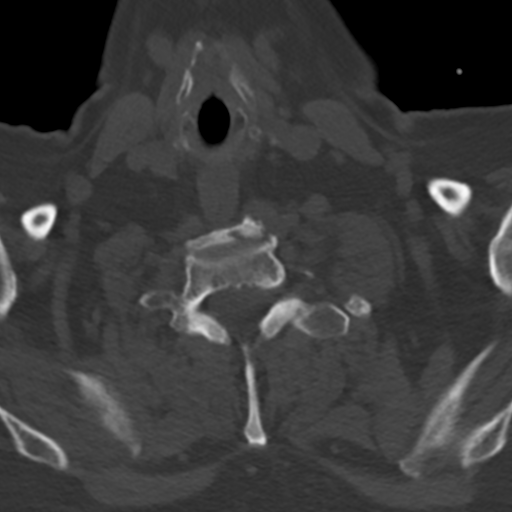
[im 25/80  bone]
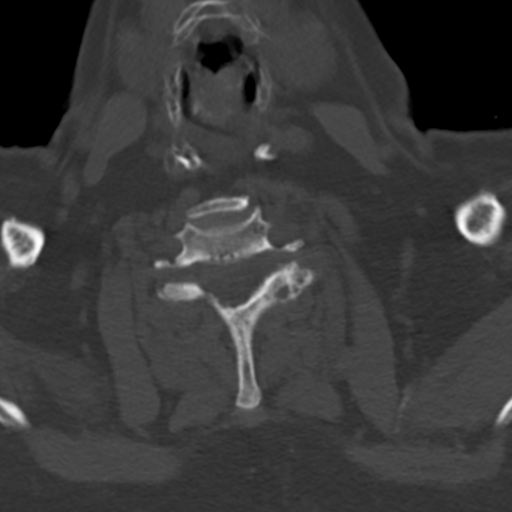
[im 31/80  bone]
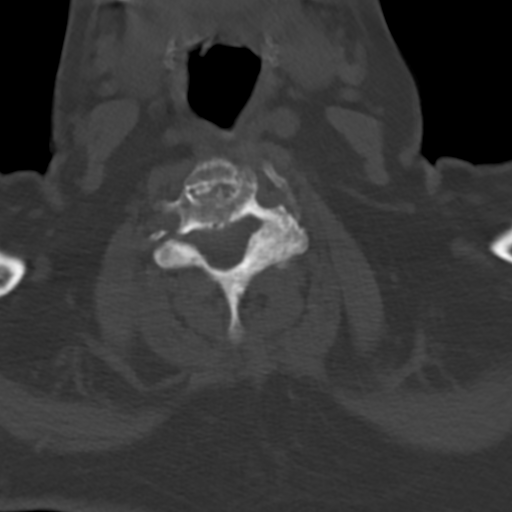
[im 43/80  soft-tissue]
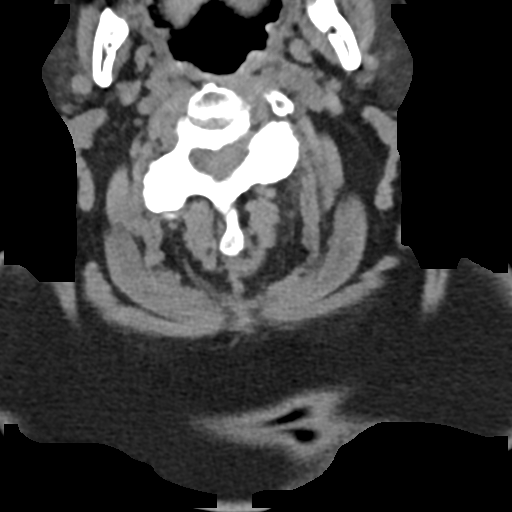
[im 43/80  bone]
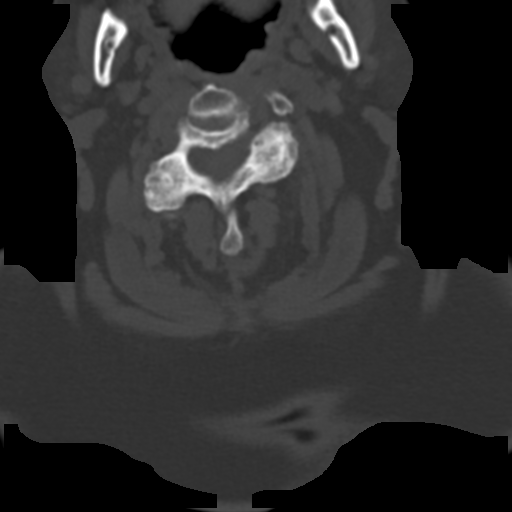
[im 49/80  bone]
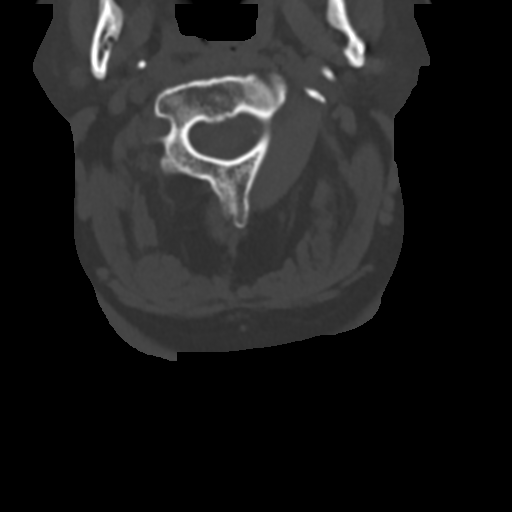
[im 55/80  bone]
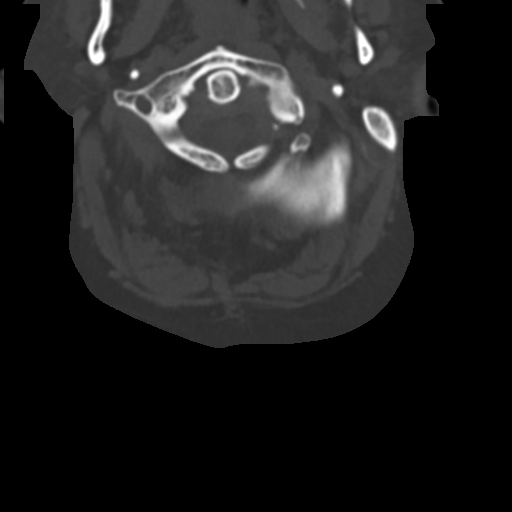
[im 67/80  bone]
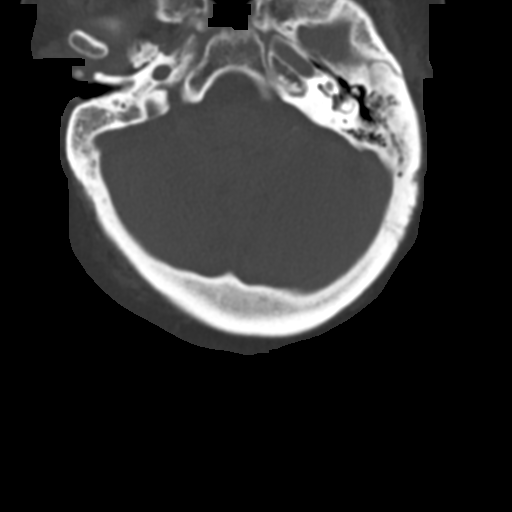
[im 73/80  soft-tissue]
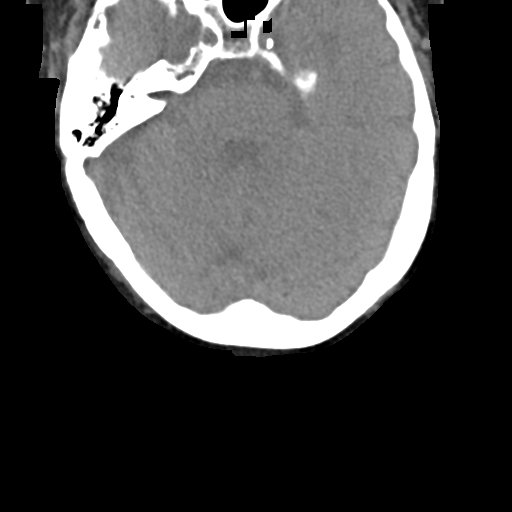
[im 73/80  bone]
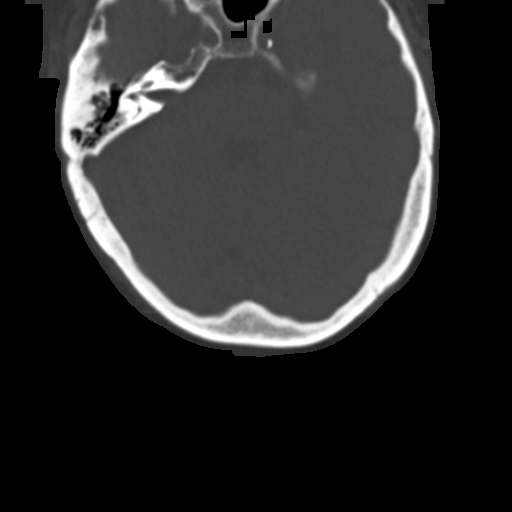

[Series 4: sagittal bone · sagittal · 0.31mm/px · 5 of 62 slices shown, 6 images]
[im 21/62  bone]
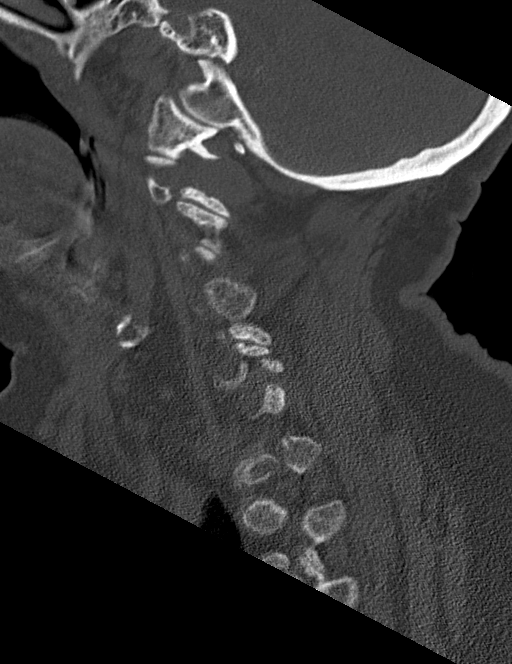
[im 26/62  bone]
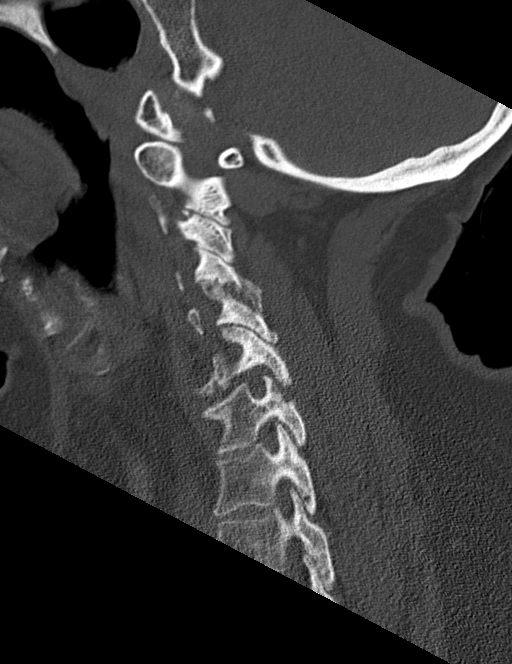
[im 31/62  soft-tissue]
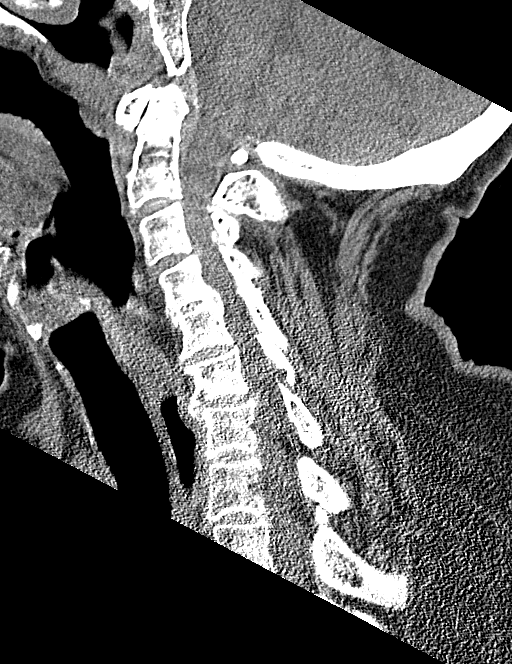
[im 31/62  bone]
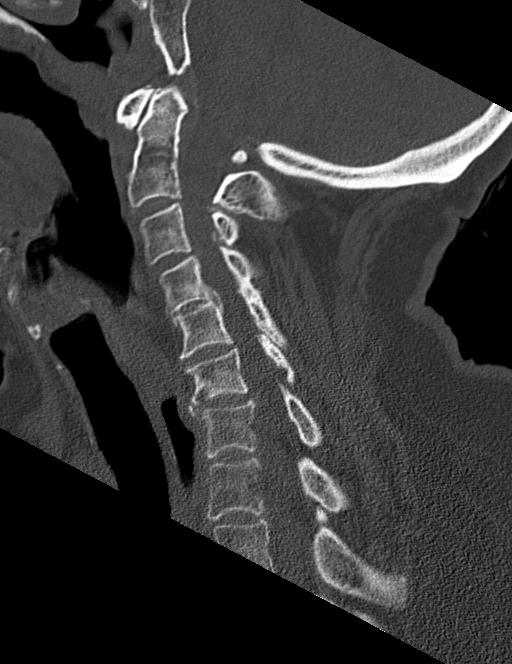
[im 36/62  bone]
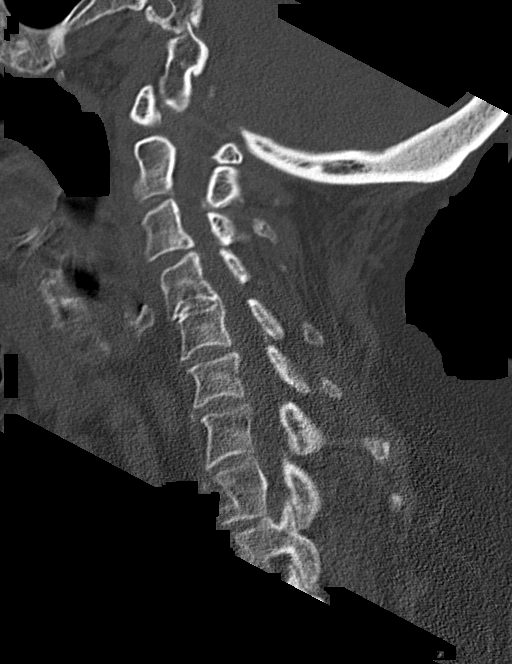
[im 41/62  bone]
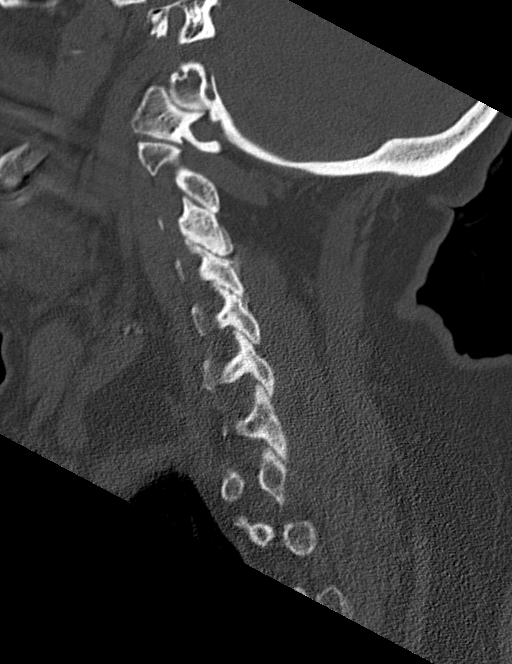

[14 of 29 positions shown; findings below may reference images not displayed]

FINDINGS: Alignment: There is reversal of lordosis.  Dextroscoliosis is seen.

Skull base and vertebrae: There is decrease in number of air cells
in mastoids.

Soft tissues and spinal canal: Degenerative changes are noted with
spinal stenosis and encroachment of neural foramina at multiple
levels.

Disc levels: Degenerative changes are noted with anterior bony
spurs, posterior bony spurs and facet hypertrophy at multiple
levels.

Upper chest: Visualized upper lung fields are clear. Pacemaker
battery is noted in the left infraclavicular region.

Other: Prevertebral soft tissues are unremarkable. No significant
interval changes are noted.
IMPRESSION: No recent fracture is seen. Cervical spondylosis with spinal
stenosis and encroachment of neural foramina at multiple levels.

## 2021-12-14 IMAGING — CT CT HEAD W/O CM
3 series · 16 of 47 positions shown, 19 images · non-contrast
Comparison: 12/28/2020

CLINICAL DATA: Unwitnessed fall.  Trauma to the head.

EXAM:
CT HEAD WITHOUT CONTRAST
TECHNIQUE: Contiguous axial images were obtained from the base of the skull
through the vertex without intravenous contrast.

[Series 3: head wo · axial · 0.41mm/px · z∈[-107,+33]mm · 10 of 34 slices shown, 13 images]
[im 3/34  brain]
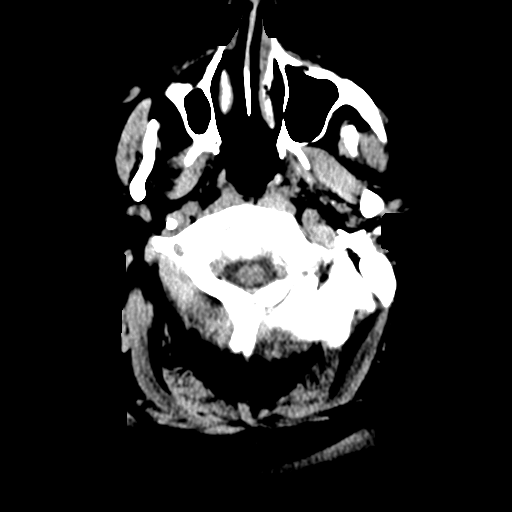
[im 3/34  bone]
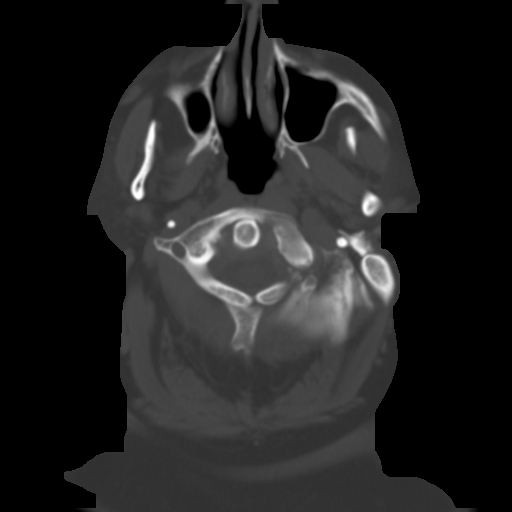
[im 6/34  brain]
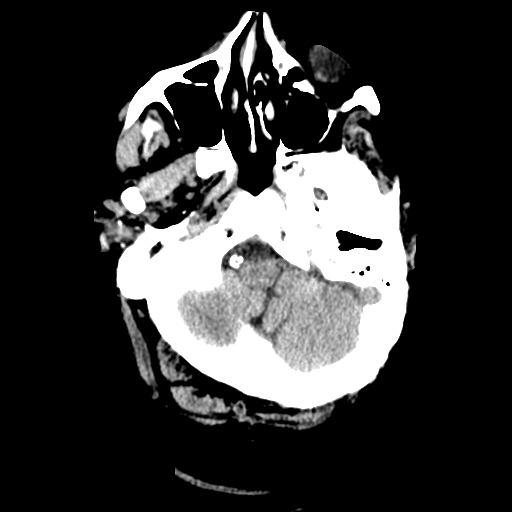
[im 10/34  brain]
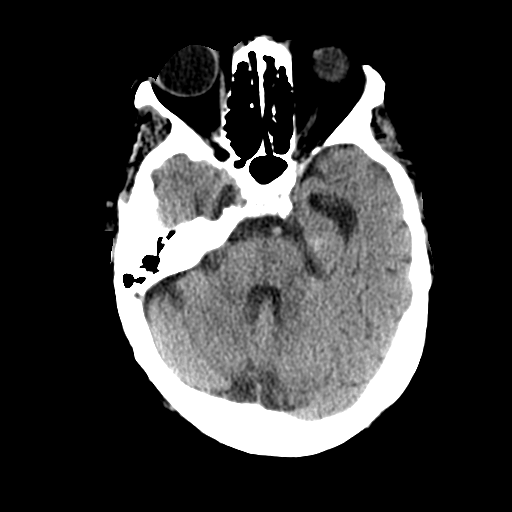
[im 12/34  brain]
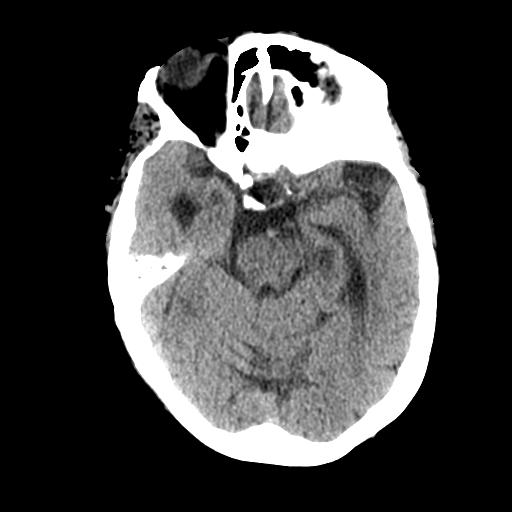
[im 15/34  brain]
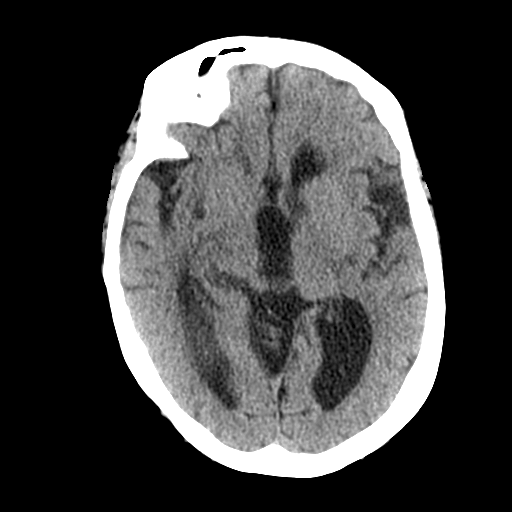
[im 15/34  bone]
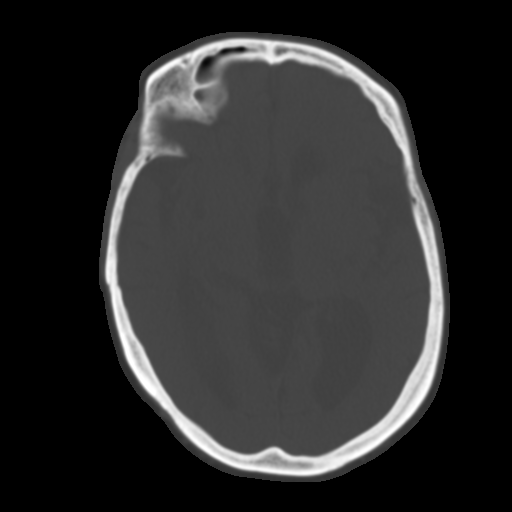
[im 19/34  brain]
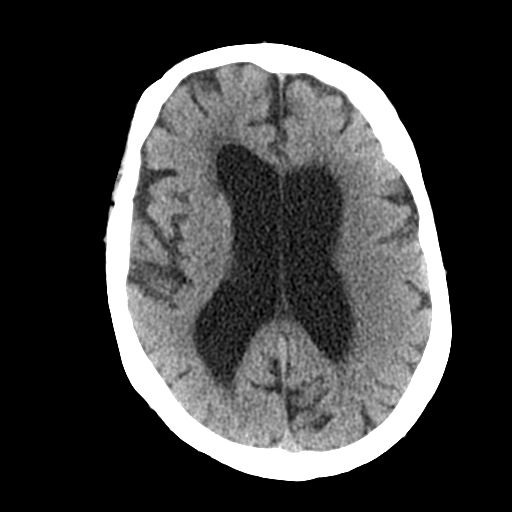
[im 22/34  brain]
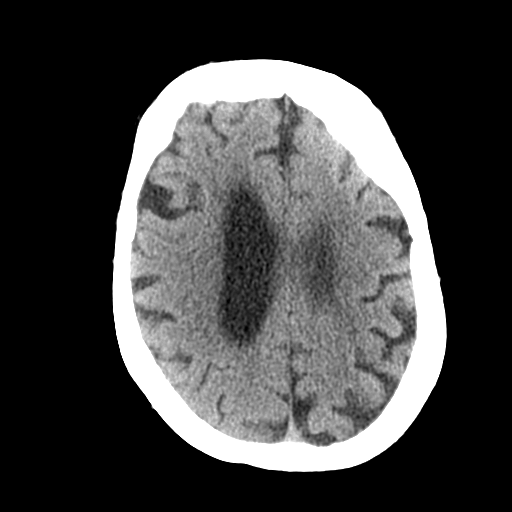
[im 26/34  brain]
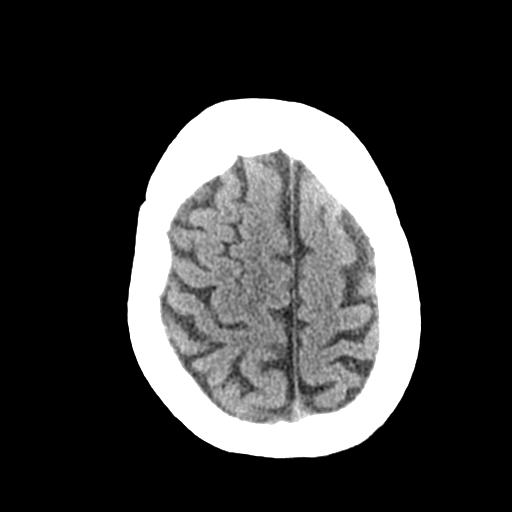
[im 28/34  brain]
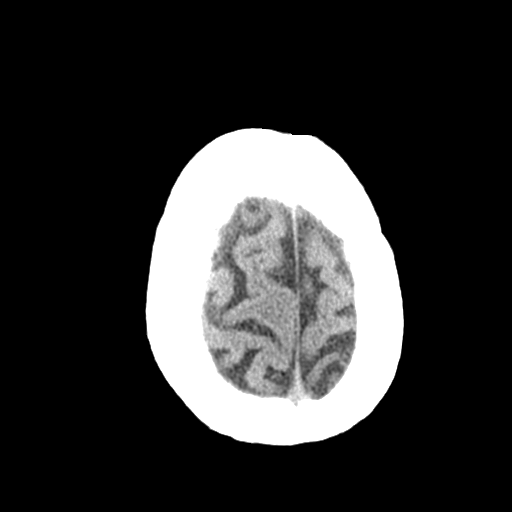
[im 28/34  bone]
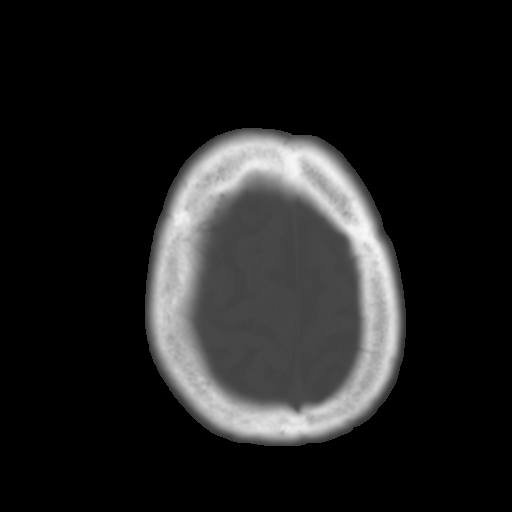
[im 31/34  brain]
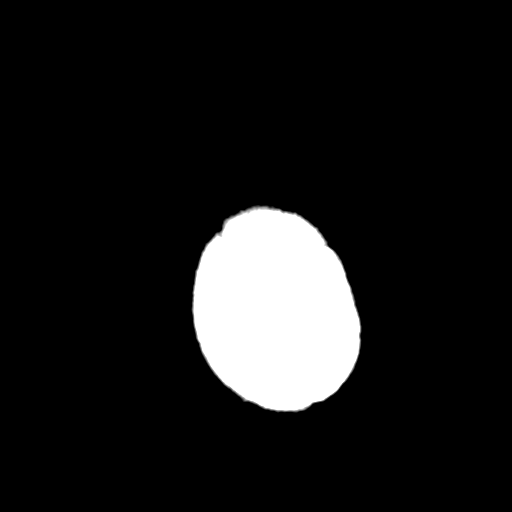

[Series 4: coronal soft tissue · coronal · 0.32mm/px · 3 of 72 slices shown]
[im 24/72  brain]
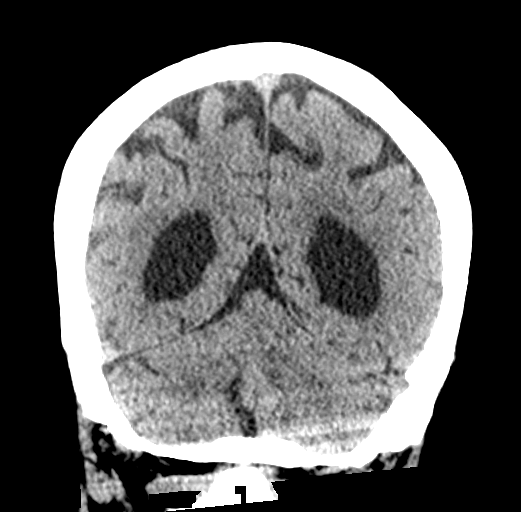
[im 32/72  brain]
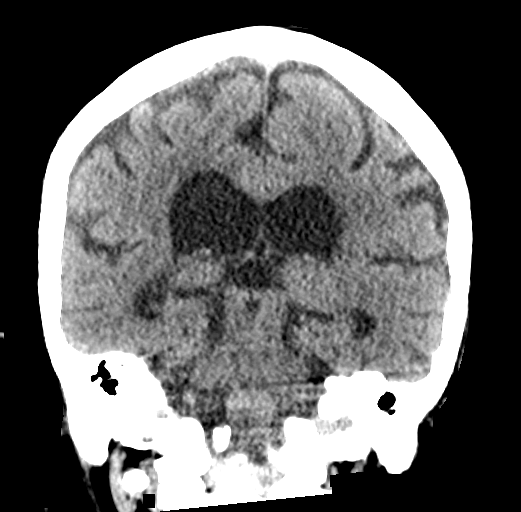
[im 40/72  brain]
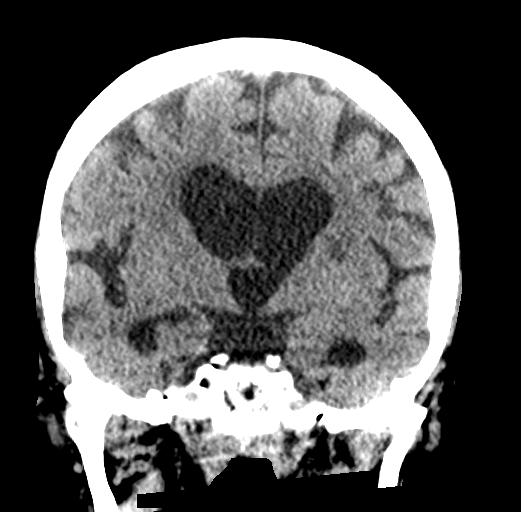

[Series 5: sagittal soft tissue · sagittal · 0.32mm/px · 3 of 57 slices shown]
[im 19/57  brain]
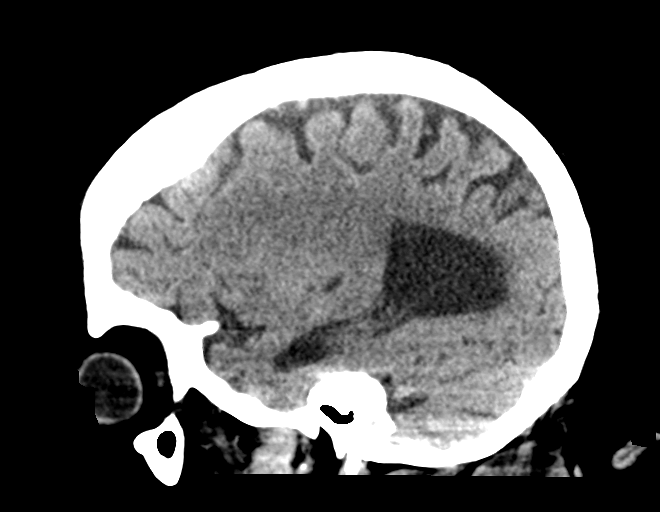
[im 29/57  brain]
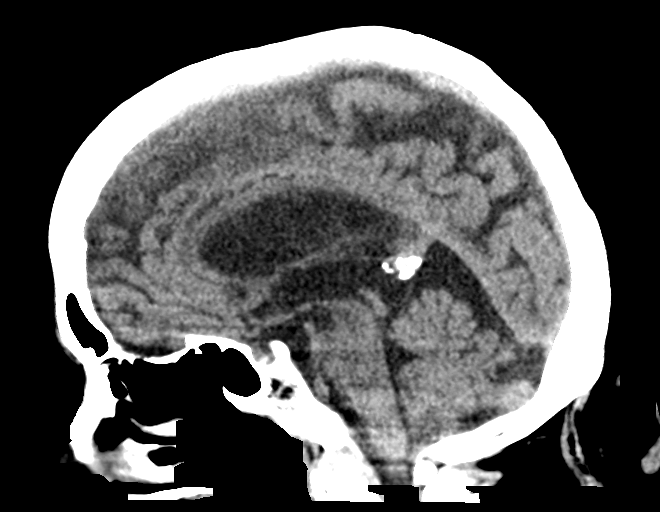
[im 38/57  brain]
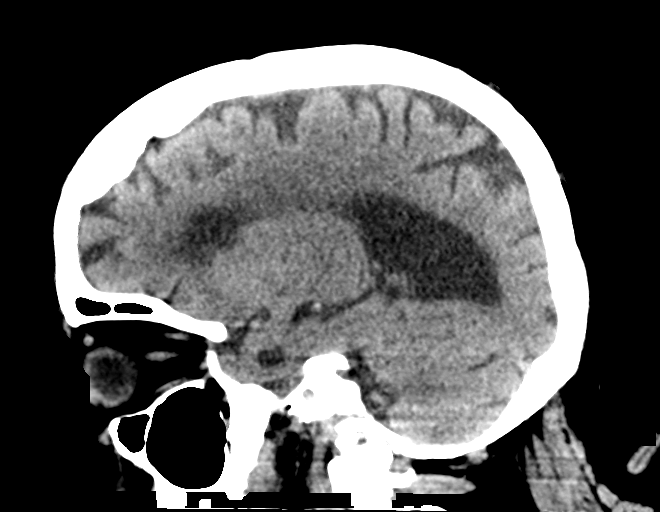

[16 of 47 positions shown; findings below may reference images not displayed]

FINDINGS: Brain: Generalized atrophy. No evidence of acute focal infarction,
mass lesion, hemorrhage, hydrocephalus or extra-axial collection.
Ventriculomegaly is consistent with the degree of atrophy.

Vascular: There is atherosclerotic calcification of the major
vessels at the base of the brain.

Skull: No skull fracture.

Sinuses/Orbits: Clear/normal

Other: None
IMPRESSION: No acute or traumatic finding.

Cerebral Atrophy ([id number]).

## 2021-12-14 IMAGING — CT CT CHEST-ABD-PELV W/O CM
4 series · 12 of 35 positions shown, 14 images · non-contrast
Comparison: 12/28/2020

CLINICAL DATA: Fell.  Chest pain with suspected rib fractures.

EXAM:
CT CHEST, ABDOMEN AND PELVIS WITHOUT CONTRAST
TECHNIQUE: Multidetector CT imaging of the chest, abdomen and pelvis was
performed following the standard protocol without IV contrast.

[Series 3: axial (person_name) (person_name) · axial · 0.72mm/px · z∈[-589,-229]mm · 4 of 122 slices shown, 5 images]
[im 25/122  soft-tissue]
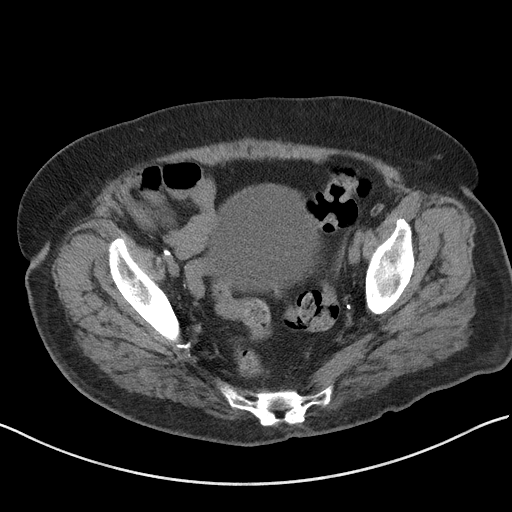
[im 25/122  bone]
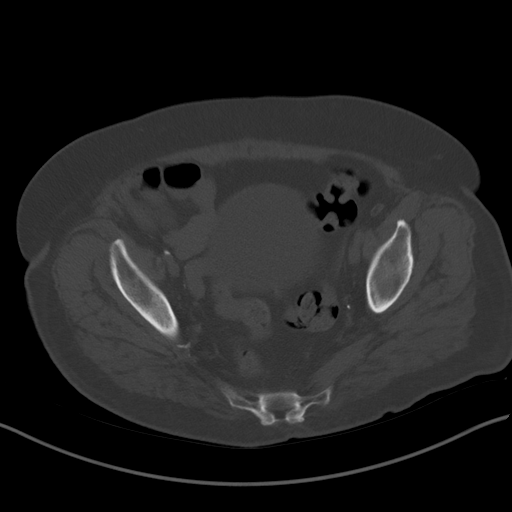
[im 49/122  soft-tissue]
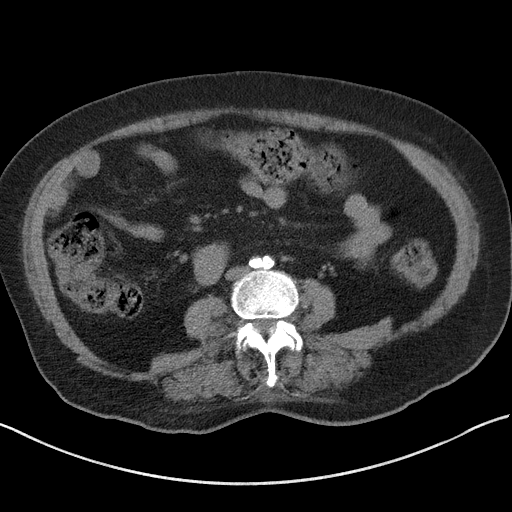
[im 73/122  soft-tissue]
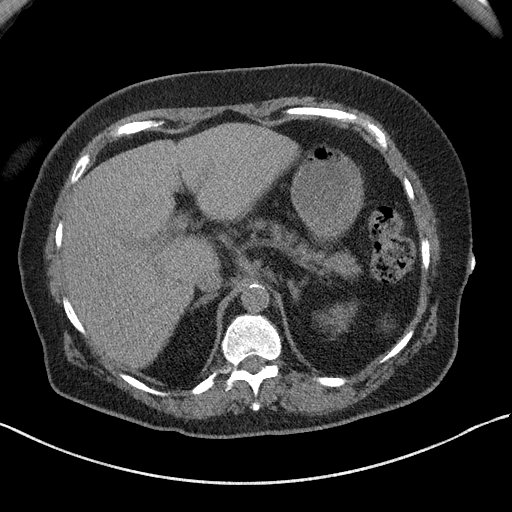
[im 97/122  soft-tissue]
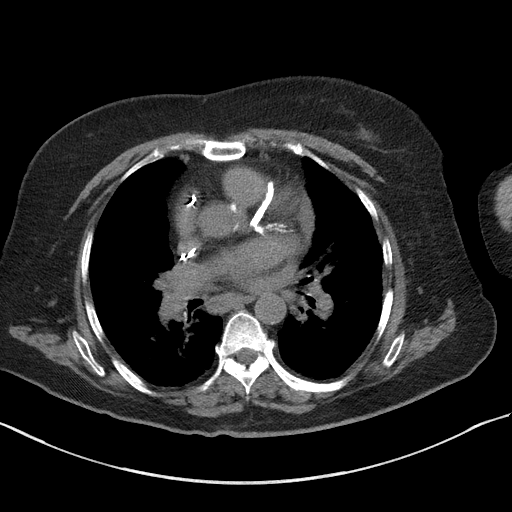

[Series 4: lungs · axial · 0.72mm/px · z∈[-380,-150]mm · 6 of 162 slices shown]
[im 24/162  soft-tissue]
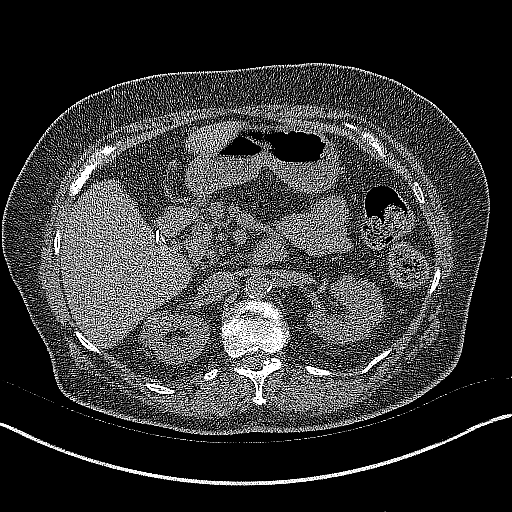
[im 47/162  soft-tissue]
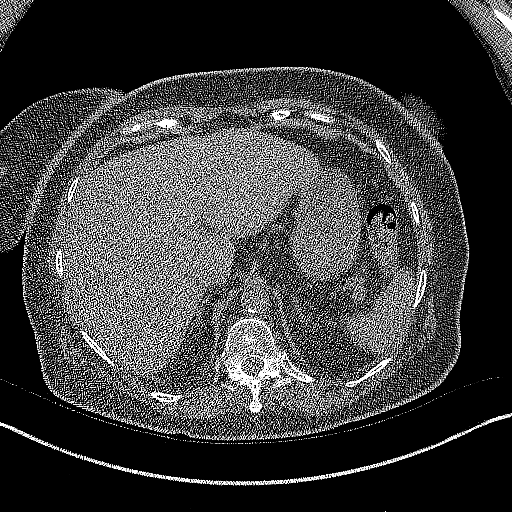
[im 70/162  soft-tissue]
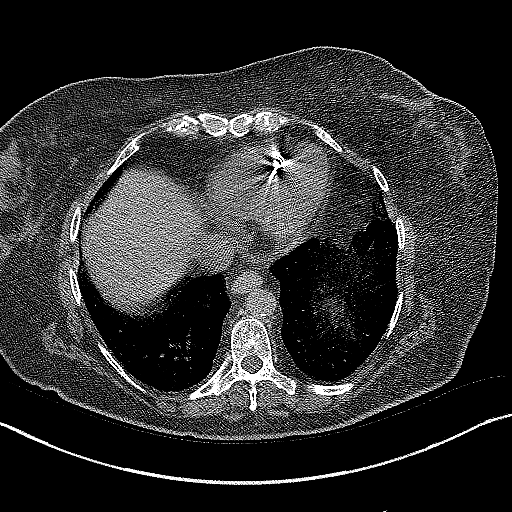
[im 93/162  soft-tissue]
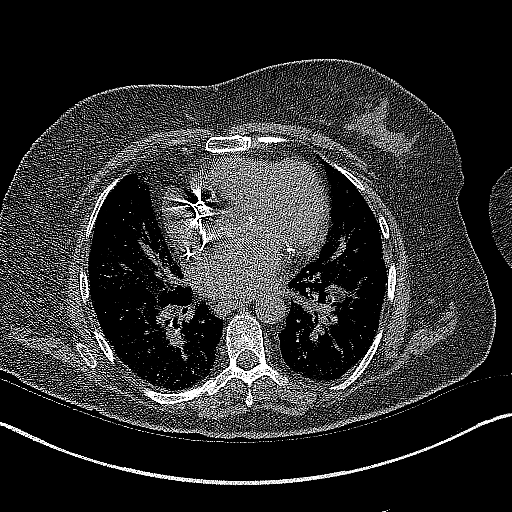
[im 116/162  soft-tissue]
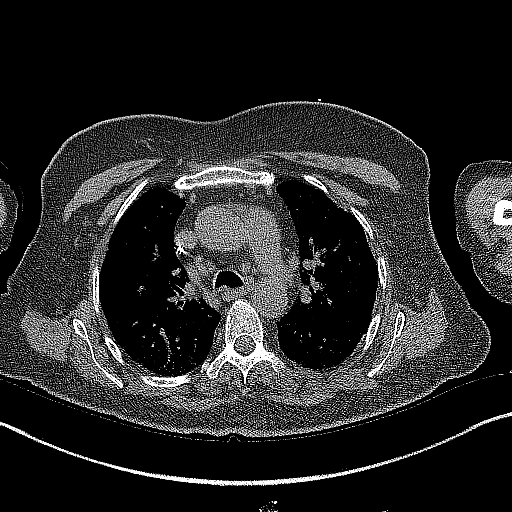
[im 139/162  soft-tissue]
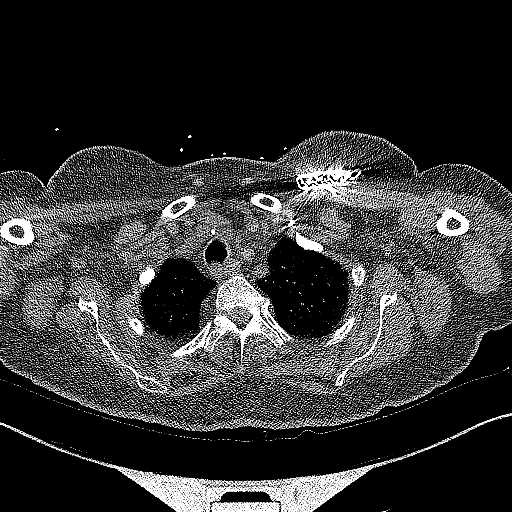

[Series 6: coronal · coronal · 0.93mm/px · 1 of 143 slices shown]
[im 57/143  bone]
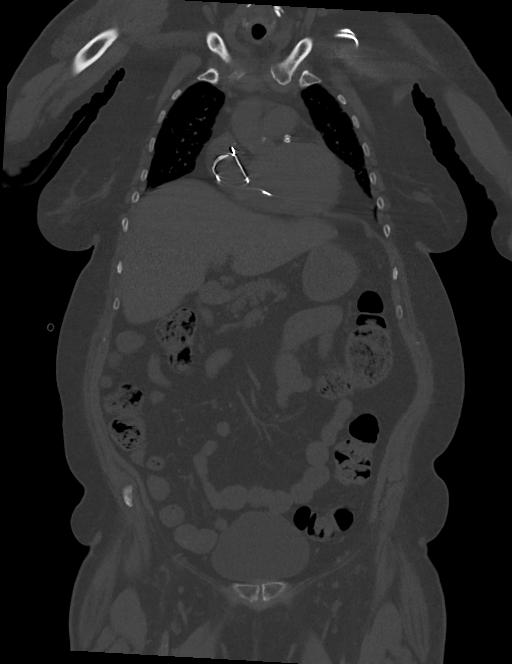

[Series 7: sagittal · sagittal · 0.59mm/px · 1 of 184 slices shown, 2 images]
[im 92/184  soft-tissue]
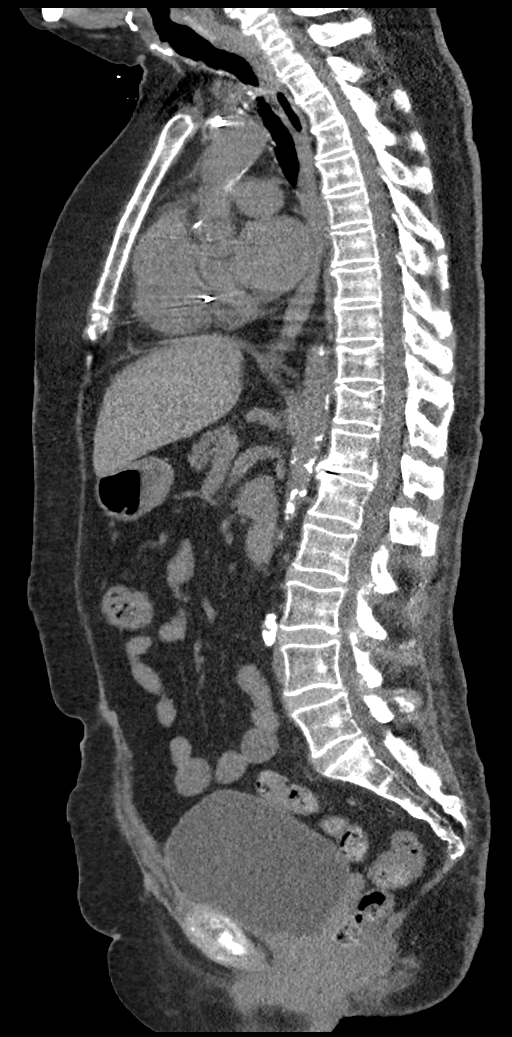
[im 92/184  bone]
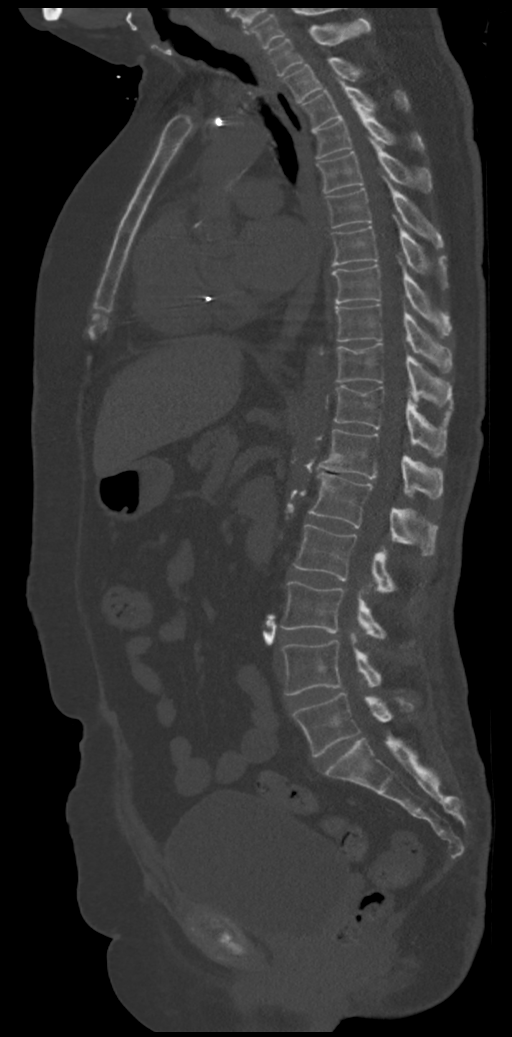

[12 of 35 positions shown; findings below may reference images not displayed]

FINDINGS: CT CHEST FINDINGS

Cardiovascular: Heart size is normal. Pacemaker in place. No
pericardial fluid of significance. Coronary artery calcification and
stents. Aortic atherosclerotic calcification.

Mediastinum/Nodes: No sign of mediastinal or hilar mass or
adenopathy or mediastinal vascular injury.

Lungs/Pleura: Widespread interstitial prominence suggesting possible
early interstitial edema. No effusion. No lobar collapse.

Musculoskeletal: No spinal fracture. Old appearing minimal superior
endplate depressions at T10 and T11. No acute rib fracture. Old
healed rib fracture of the left lateral sixth rib.

CT ABDOMEN PELVIS FINDINGS

Hepatobiliary: Liver appears normal without contrast. Previous
cholecystectomy.

Pancreas: Normal

Spleen: Normal

Adrenals/Urinary Tract: Adrenal glands are normal. Kidneys show
vascular calcification and several small nonobstructing stones. No
hydronephrosis. No stone in the bladder.

Stomach/Bowel: Stomach and small intestine are normal. No acute or
significant: Finding. Moderate amount of stool.

Vascular/Lymphatic: Aortic atherosclerosis. No aneurysm. IVC is
normal. No retroperitoneal adenopathy.

Reproductive: Previous hysterectomy.  No pelvic mass.

Other: No free fluid or air.

Musculoskeletal: Ordinary mild lumbar degenerative changes.
IMPRESSION: No traumatic chest finding.  No right-sided rib fracture.

Interstitial pulmonary prominence that could reflect early pulmonary
edema.

No acute or traumatic abdominal finding. Aortic Atherosclerosis
([id number]). Small nonobstructing renal calculi.

## 2021-12-17 IMAGING — CT CT CERVICAL SPINE W/O CM
3 of 4 series · 14 of 35 positions shown, 17 images · non-contrast
Comparison: CT head 01/05/2021

CLINICAL DATA: Unwitnessed fall.  Facial trauma

EXAM:
CT HEAD WITHOUT CONTRAST
CT CERVICAL SPINE WITHOUT CONTRAST
TECHNIQUE: Multidetector CT imaging of the head and cervical spine was
performed following the standard protocol without intravenous
contrast. Multiplanar CT image reconstructions of the cervical spine
were also generated.

[Series 4: sagittal bone · sagittal · 0.40mm/px · 5 of 89 slices shown, 6 images]
[im 30/89  bone]
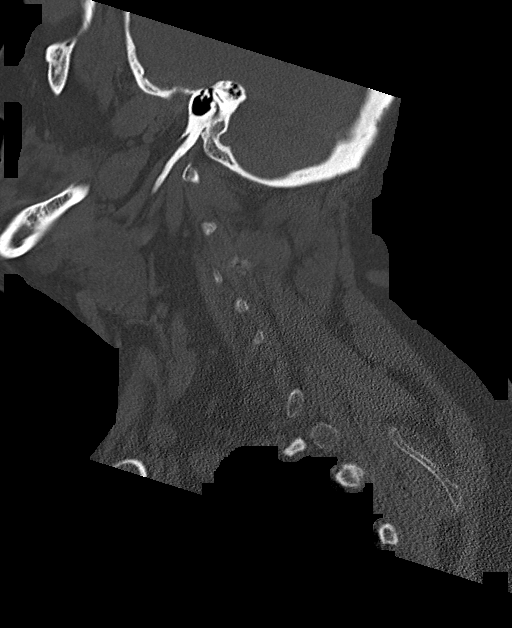
[im 37/89  bone]
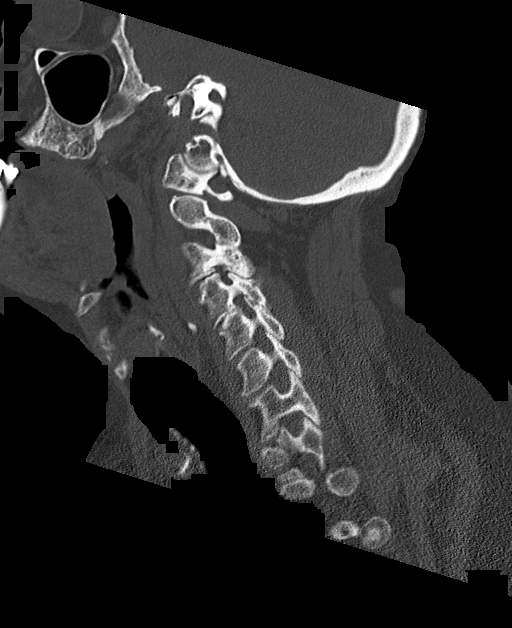
[im 45/89  soft-tissue]
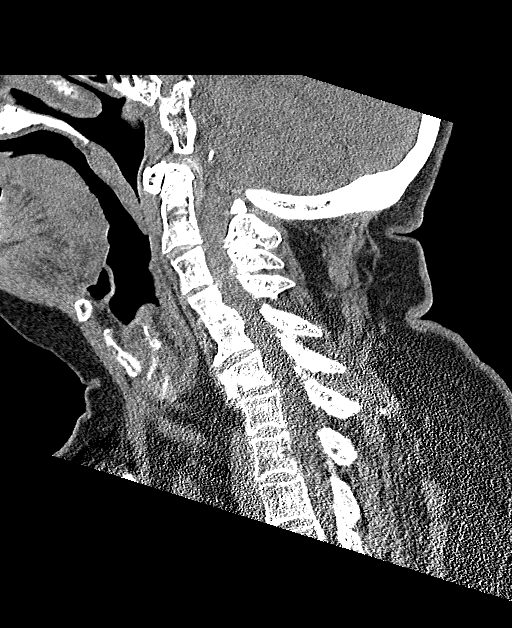
[im 45/89  bone]
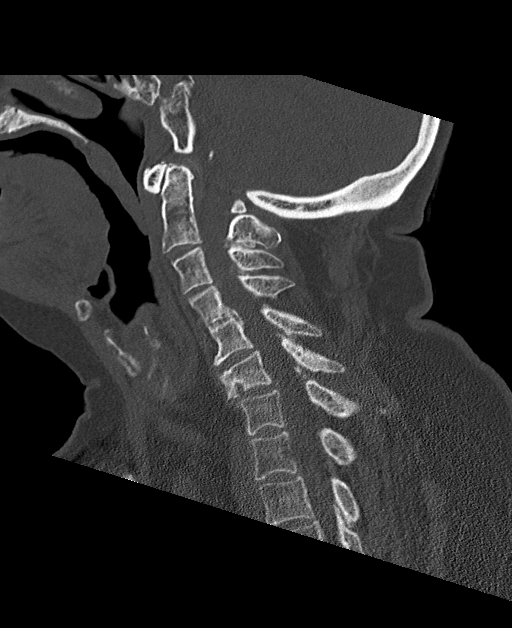
[im 52/89  bone]
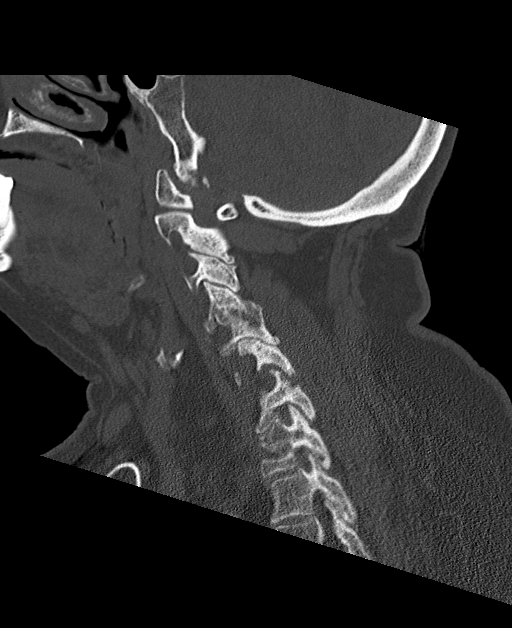
[im 59/89  bone]
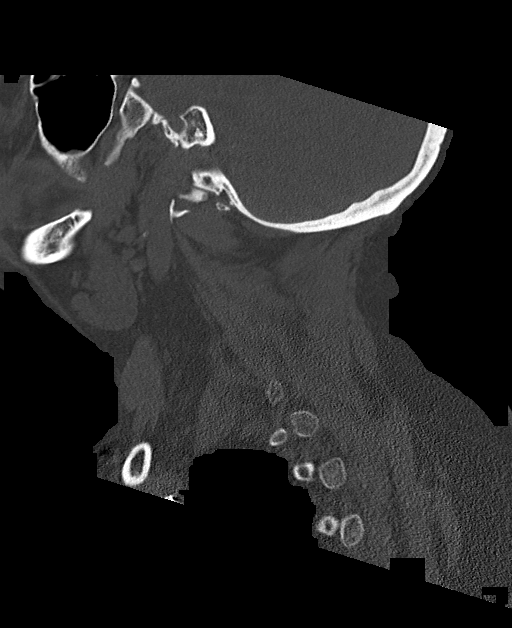

[Series 5: coronal bone · coronal · 0.44mm/px · 3 of 150 slices shown]
[im 30/150  bone]
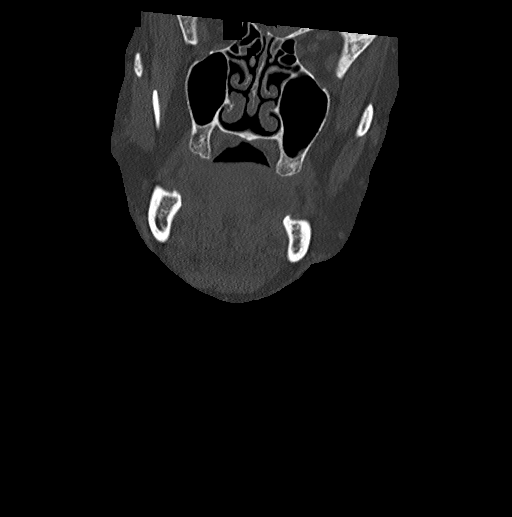
[im 60/150  bone]
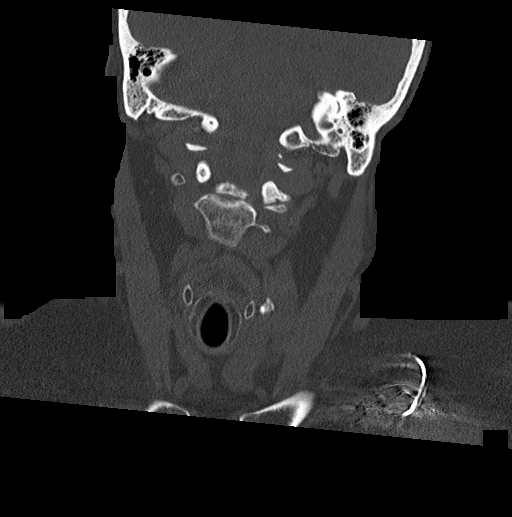
[im 90/150  bone]
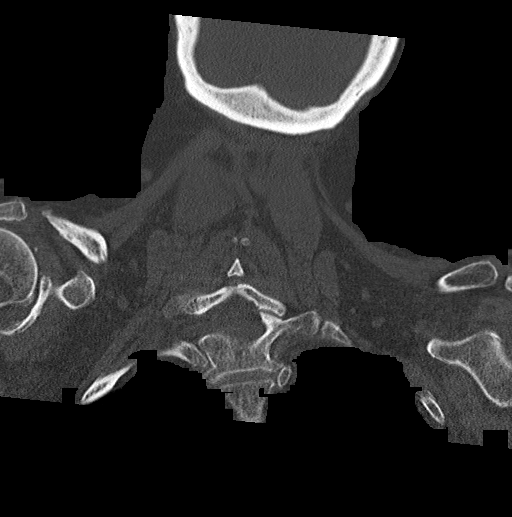

[Series 6: orthogonal bone · axial · 0.35mm/px · z∈[-354,-197]mm · 6 of 120 slices shown, 8 images]
[im 18/120  soft-tissue]
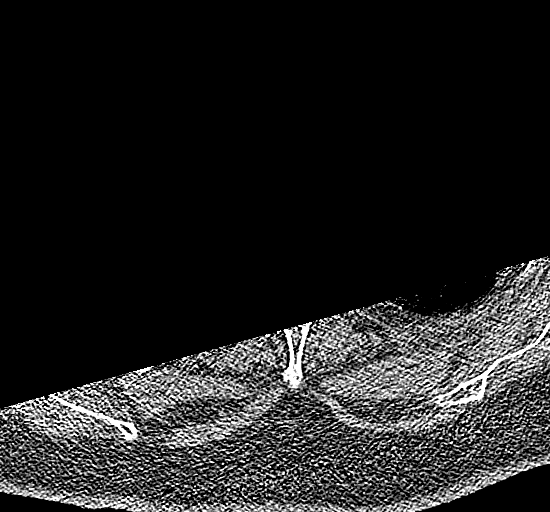
[im 18/120  bone]
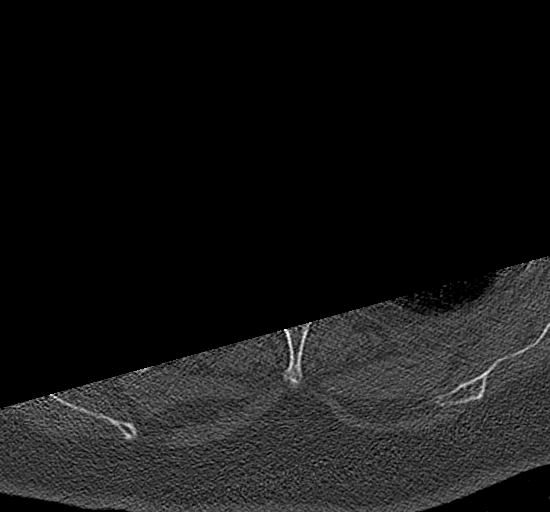
[im 35/120  bone]
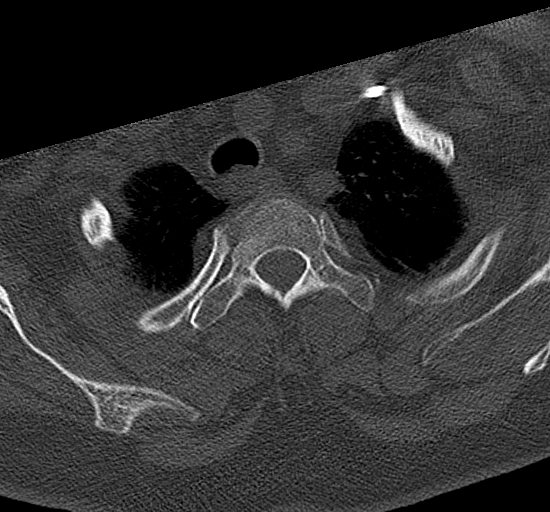
[im 52/120  bone]
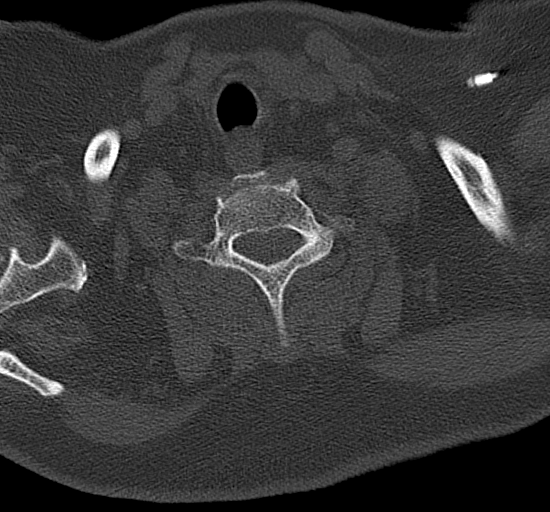
[im 69/120  bone]
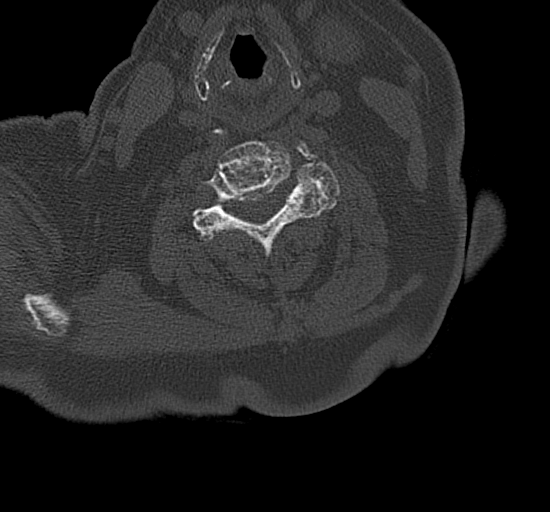
[im 86/120  soft-tissue]
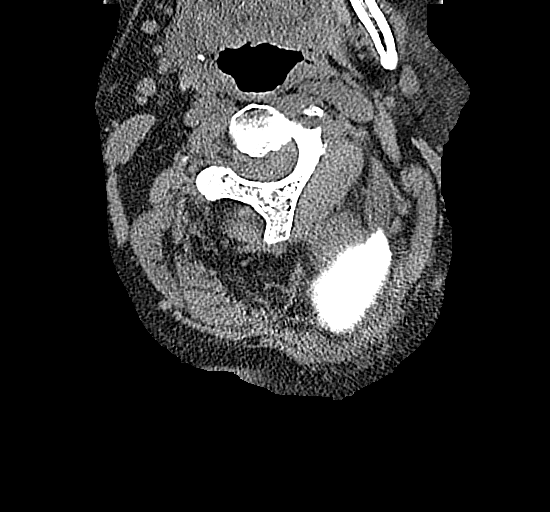
[im 86/120  bone]
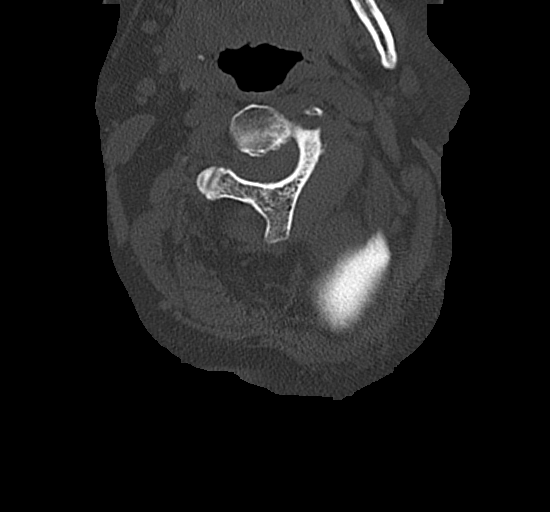
[im 103/120  bone]
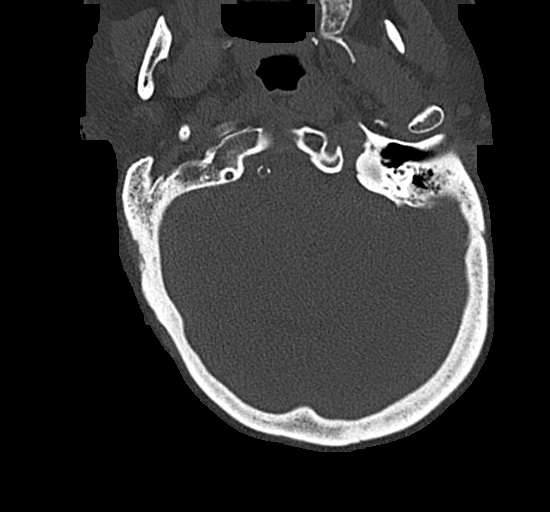

[14 of 35 positions shown; findings below may reference images not displayed]

FINDINGS: CT HEAD FINDINGS

Brain: Moderate atrophy. Mild chronic microvascular ischemic change
in the white matter and basal ganglia. Negative for acute infarct,
hemorrhage, mass.

Vascular: Negative for hyperdense vessel

Skull: Negative

Sinuses/Orbits: Paranasal sinuses clear. Bilateral cataract
extraction

Other: None

CT CERVICAL SPINE FINDINGS

Alignment: Mild anterolisthesis C4-5

Skull base and vertebrae: Negative for fracture

Soft tissues and spinal canal: No acute soft tissue abnormality.
Left-sided pacemaker

Disc levels: Multilevel disc and facet degeneration with spurring.
Multilevel spinal and foraminal stenosis due to spurring.

Upper chest: Lung apices clear bilaterally.

Other: None
IMPRESSION: 1. No acute intracranial abnormality. Atrophy and chronic
microvascular ischemia
2. Negative for cervical spine fracture.  Moderate spondylosis.

## 2021-12-17 IMAGING — DX DG HUMERUS 2V *L*
2 series · 2 of 2 positions shown · non-contrast
Comparison: None.

CLINICAL DATA: Recent fall.  Left arm pain

EXAM:
LEFT HUMERUS - 2+ VIEW

[humerus ap]
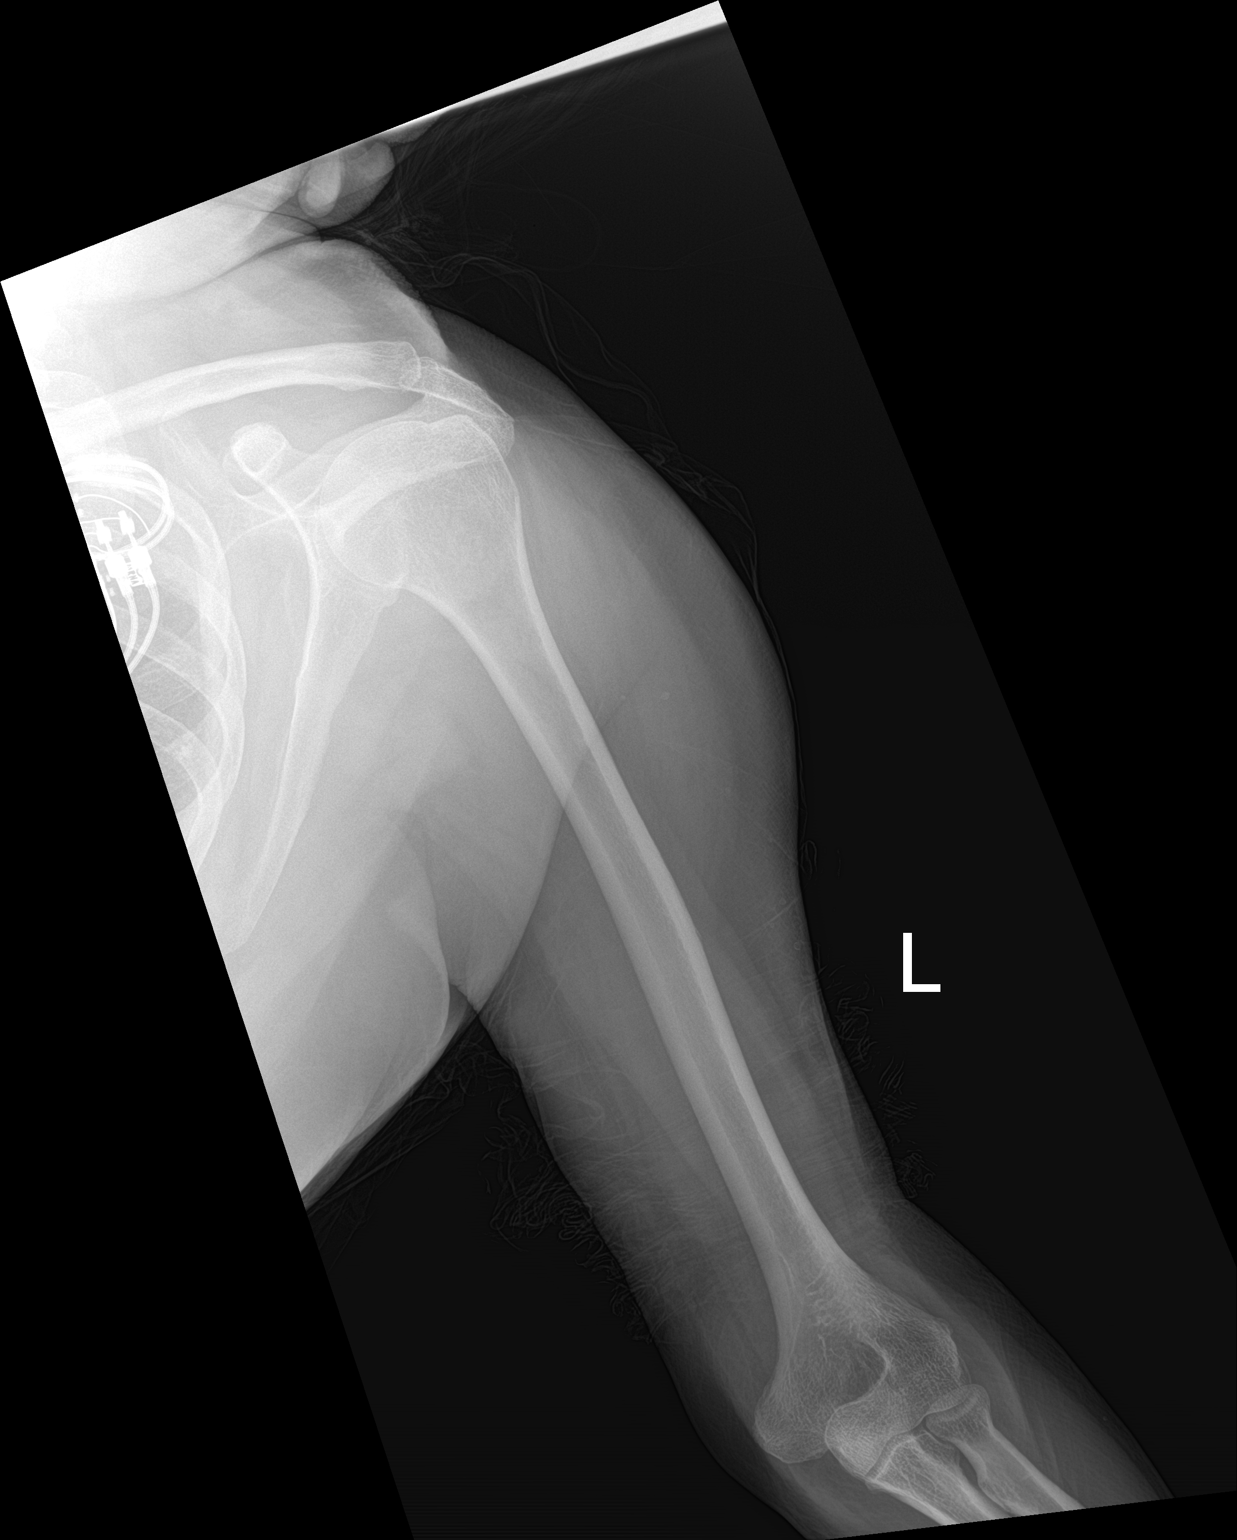

[humerus lat]
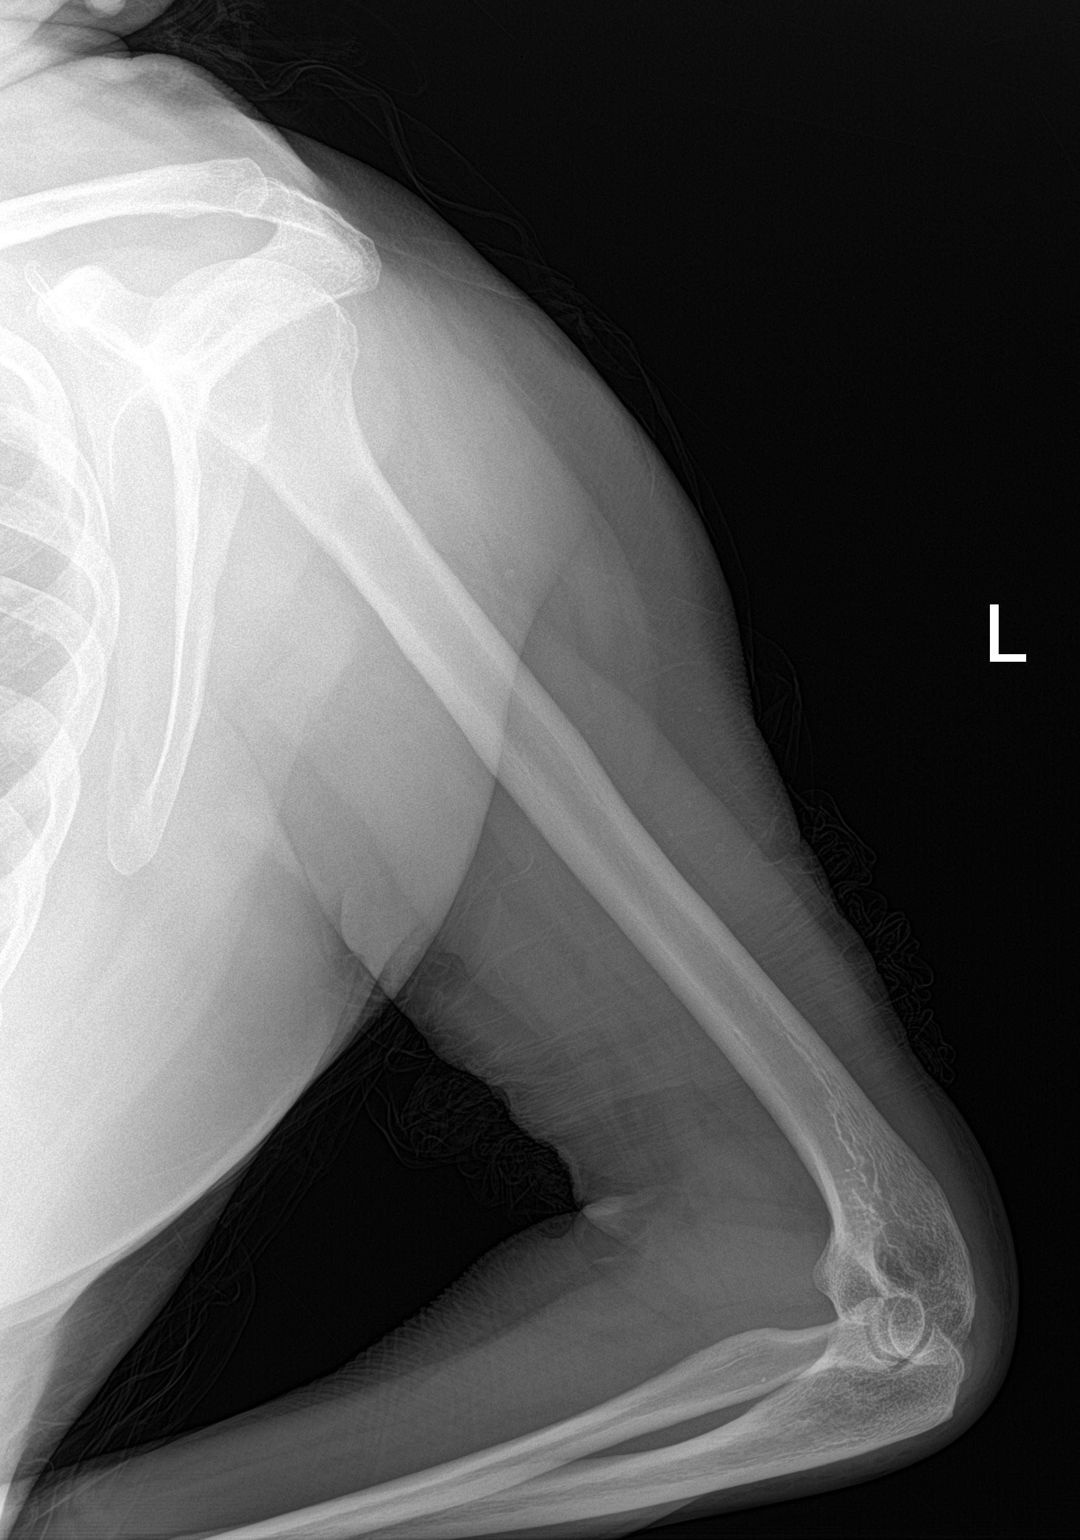

[2 of 2 positions shown; findings below may reference images not displayed]

FINDINGS: Normal alignment no fracture. Rotator cuff impingement with
downsloping acromion. Pacemaker noted.
IMPRESSION: Negative for fracture.  Rotator cuff impingement

## 2021-12-17 IMAGING — CT CT HEAD W/O CM
3 series · 15 of 47 positions shown, 18 images · non-contrast
Comparison: CT head 01/05/2021

CLINICAL DATA: Unwitnessed fall.  Facial trauma

EXAM:
CT HEAD WITHOUT CONTRAST
CT CERVICAL SPINE WITHOUT CONTRAST
TECHNIQUE: Multidetector CT imaging of the head and cervical spine was
performed following the standard protocol without intravenous
contrast. Multiplanar CT image reconstructions of the cervical spine
were also generated.

[Series 2: head wo · axial · 0.42mm/px · z∈[-151,-26]mm · 9 of 31 slices shown, 12 images]
[im 3/31  brain]
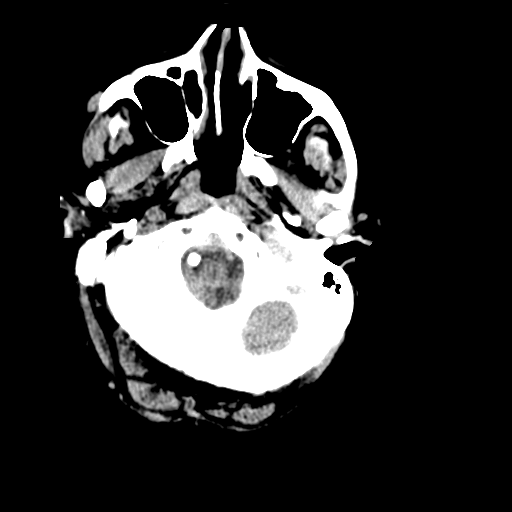
[im 3/31  bone]
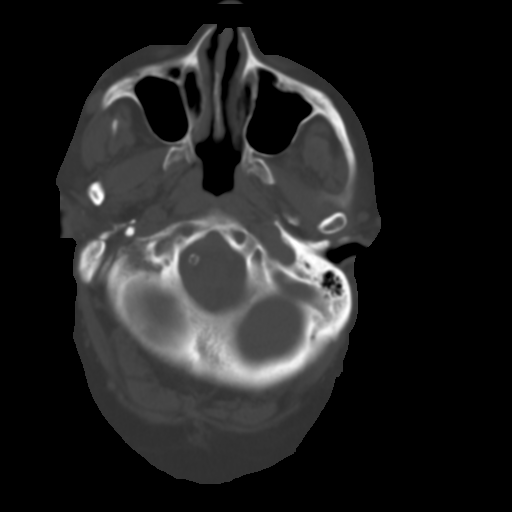
[im 6/31  brain]
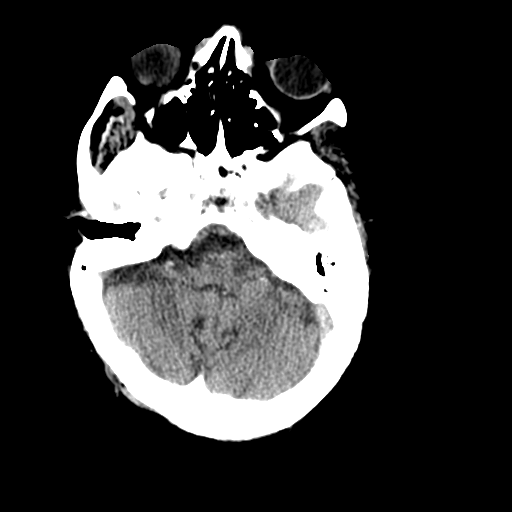
[im 9/31  brain]
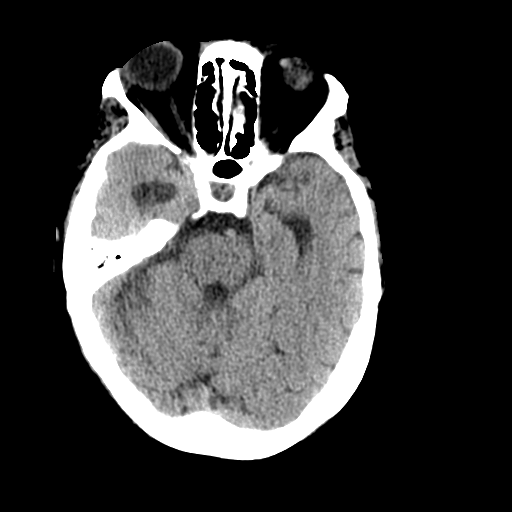
[im 12/31  brain]
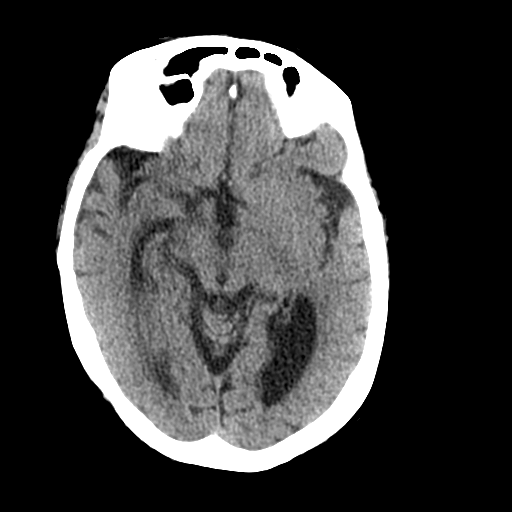
[im 16/31  brain]
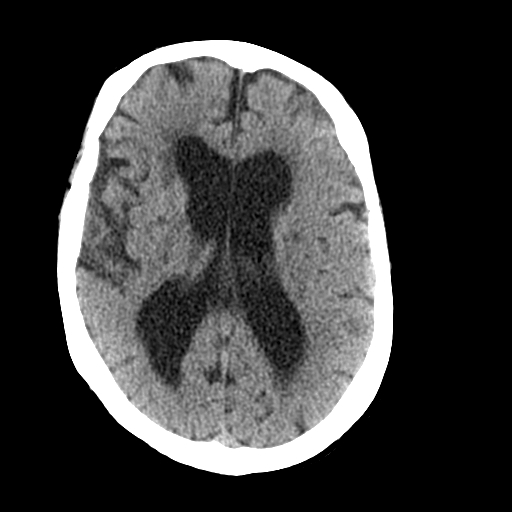
[im 16/31  bone]
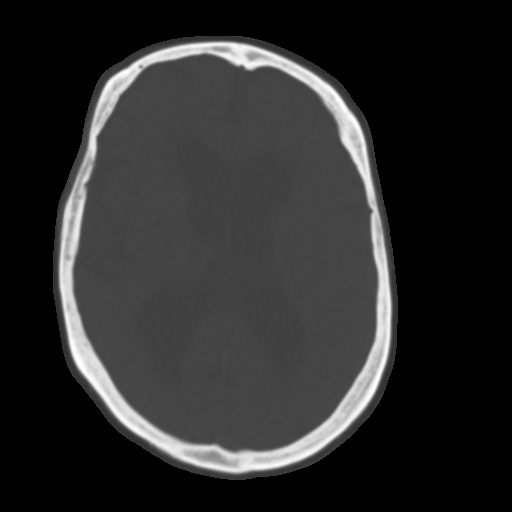
[im 19/31  brain]
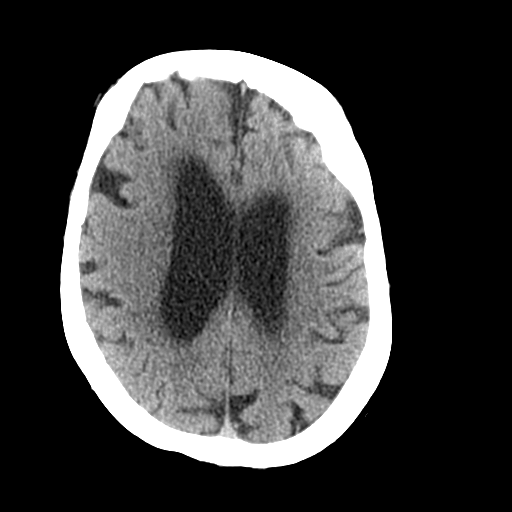
[im 22/31  brain]
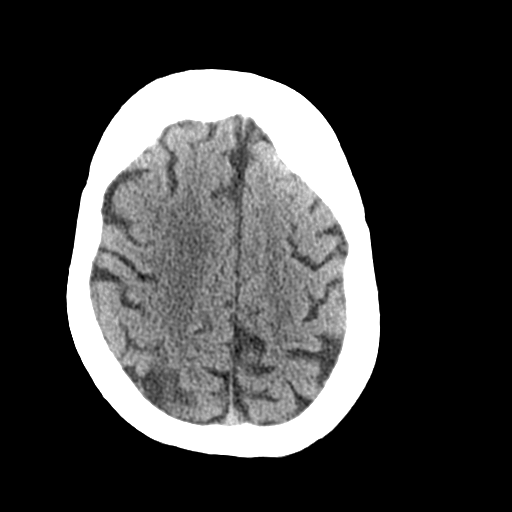
[im 25/31  brain]
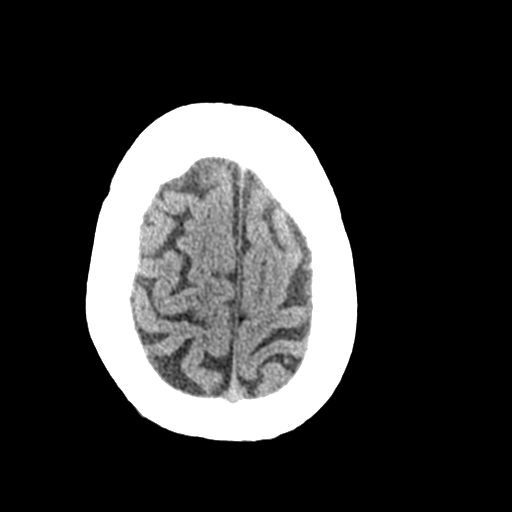
[im 28/31  brain]
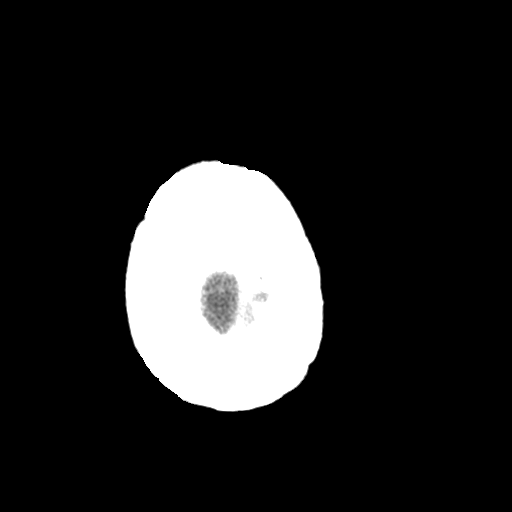
[im 28/31  bone]
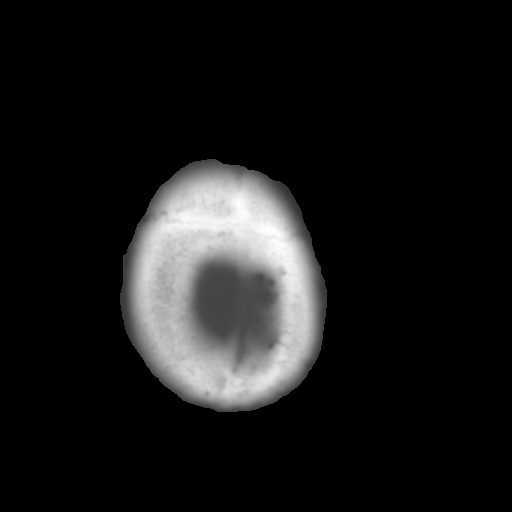

[Series 4: coronal soft tissue · coronal · 0.28mm/px · 3 of 66 slices shown]
[im 22/66  brain]
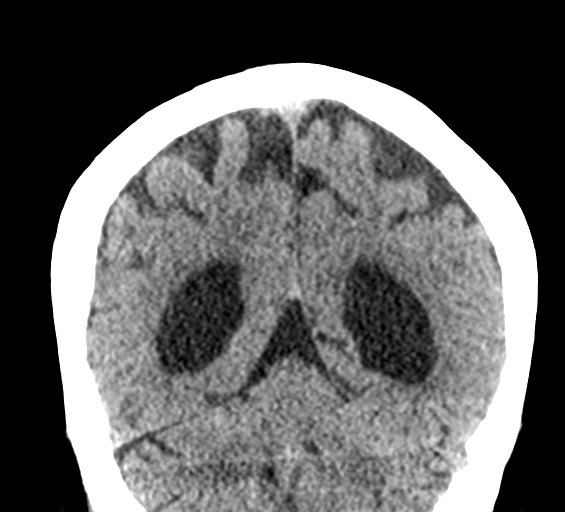
[im 29/66  brain]
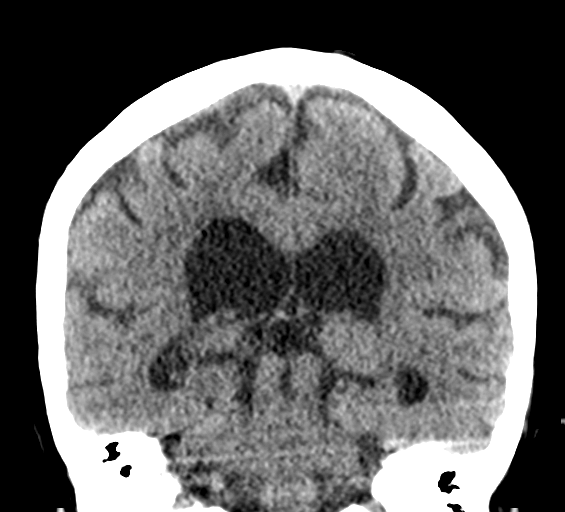
[im 37/66  brain]
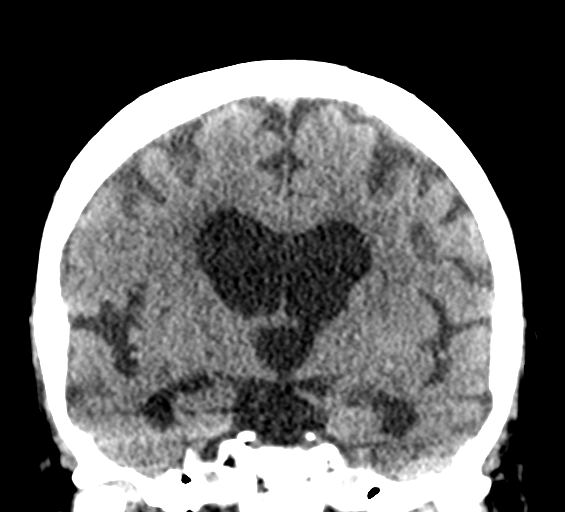

[Series 5: sagittal soft tissue · sagittal · 0.32mm/px · 3 of 54 slices shown]
[im 18/54  brain]
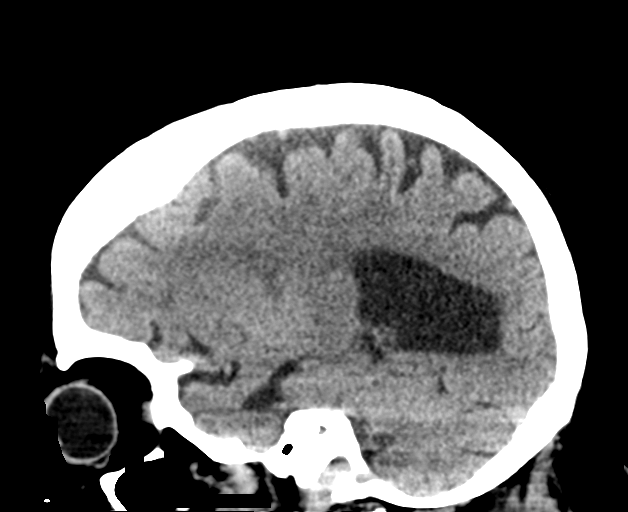
[im 27/54  brain]
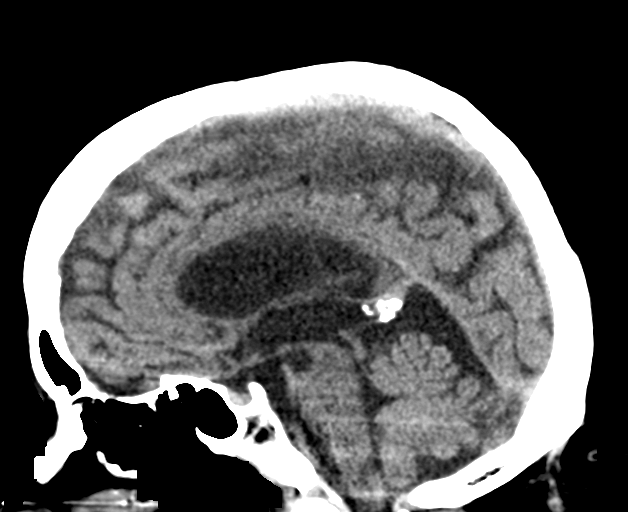
[im 36/54  brain]
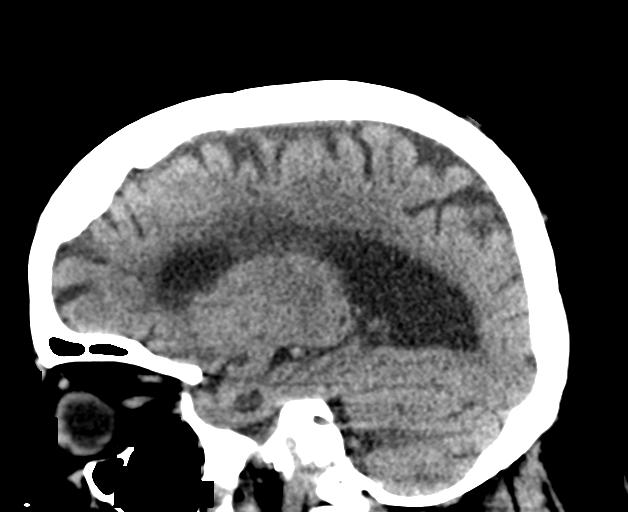

[15 of 47 positions shown; findings below may reference images not displayed]

FINDINGS: CT HEAD FINDINGS

Brain: Moderate atrophy. Mild chronic microvascular ischemic change
in the white matter and basal ganglia. Negative for acute infarct,
hemorrhage, mass.

Vascular: Negative for hyperdense vessel

Skull: Negative

Sinuses/Orbits: Paranasal sinuses clear. Bilateral cataract
extraction

Other: None

CT CERVICAL SPINE FINDINGS

Alignment: Mild anterolisthesis C4-5

Skull base and vertebrae: Negative for fracture

Soft tissues and spinal canal: No acute soft tissue abnormality.
Left-sided pacemaker

Disc levels: Multilevel disc and facet degeneration with spurring.
Multilevel spinal and foraminal stenosis due to spurring.

Upper chest: Lung apices clear bilaterally.

Other: None
IMPRESSION: 1. No acute intracranial abnormality. Atrophy and chronic
microvascular ischemia
2. Negative for cervical spine fracture.  Moderate spondylosis.

## 2022-01-05 IMAGING — CT CT HEAD W/O CM
4 series · 15 of 47 positions shown, 17 images · non-contrast
Comparison: CT head 01/08/2021

CLINICAL DATA: Altered mental status

EXAM:
CT HEAD WITHOUT CONTRAST
TECHNIQUE: Contiguous axial images were obtained from the base of the skull
through the vertex without intravenous contrast.

[Series 2: head bone · axial · 0.41mm/px · z∈[-146,-130]mm · 2 of 79 slices shown]
[im 8/79  bone]
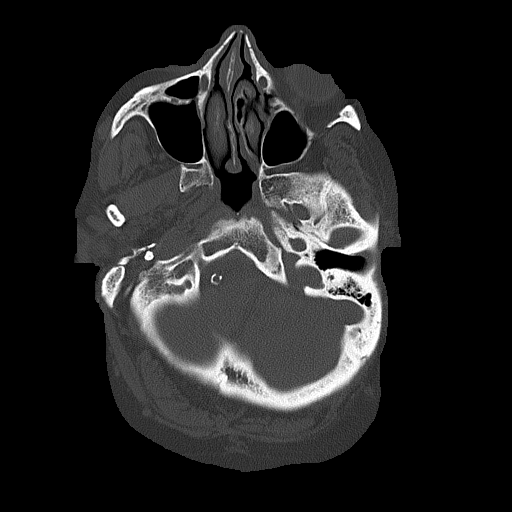
[im 16/79  bone]
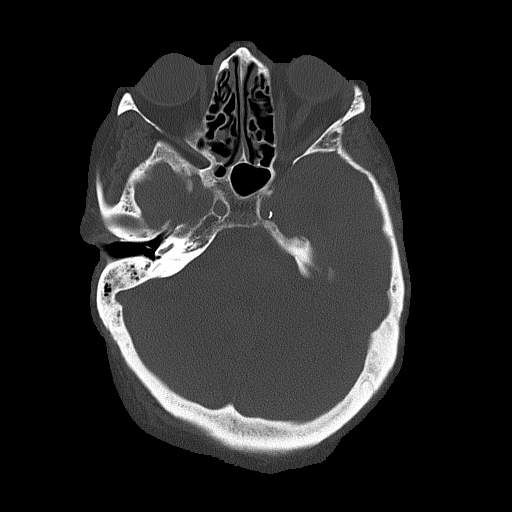

[Series 3: ax head wo · axial · 0.32mm/px · z∈[-124,-8]mm · 7 of 32 slices shown, 9 images]
[im 4/32  brain]
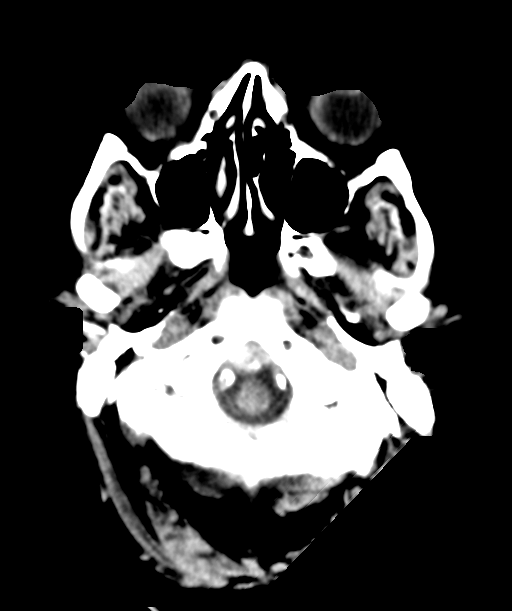
[im 4/32  bone]
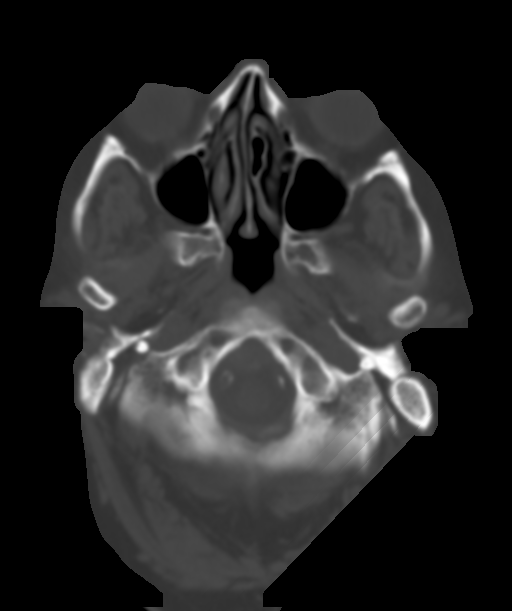
[im 8/32  brain]
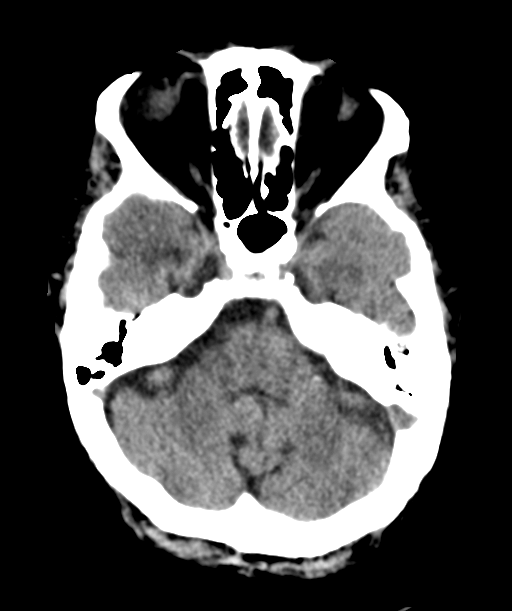
[im 12/32  brain]
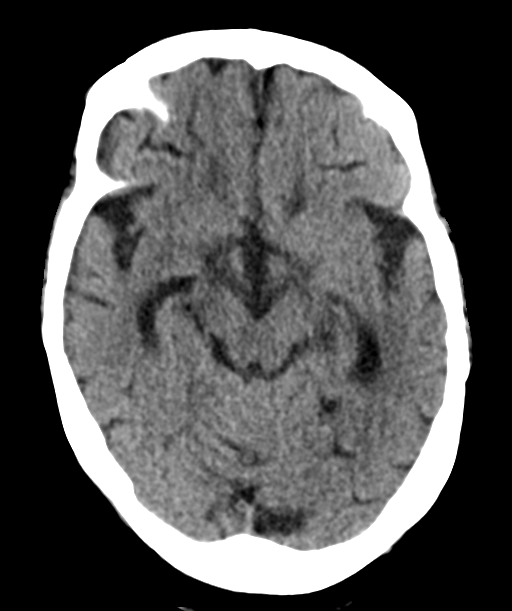
[im 16/32  brain]
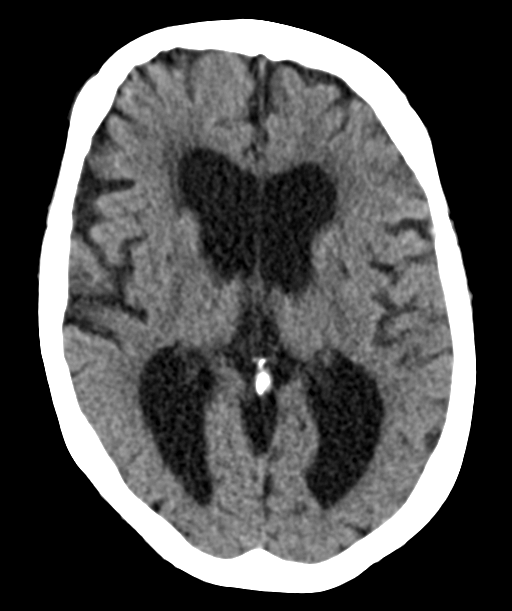
[im 20/32  brain]
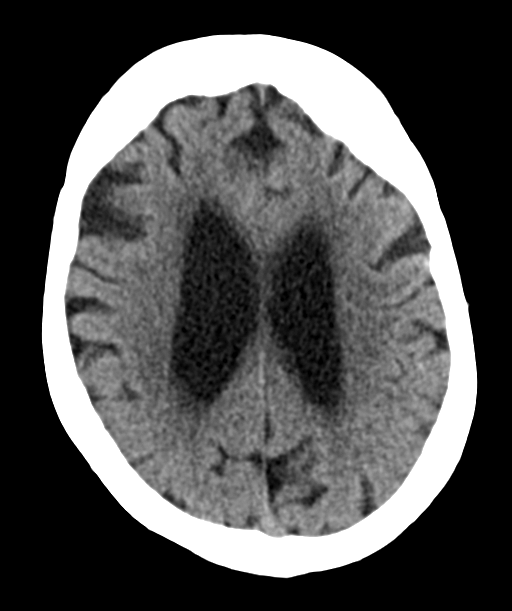
[im 20/32  bone]
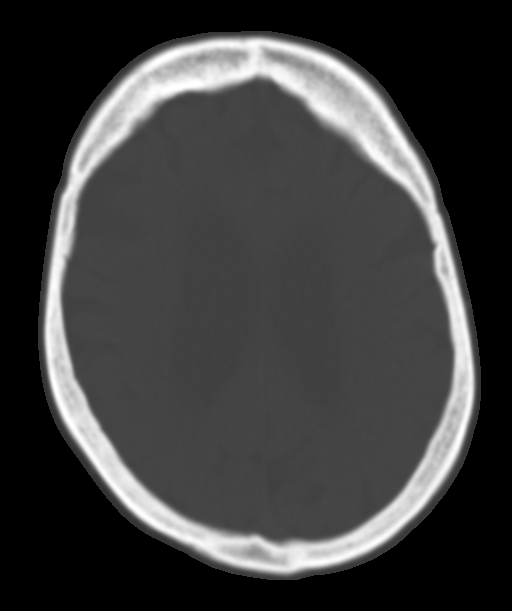
[im 24/32  brain]
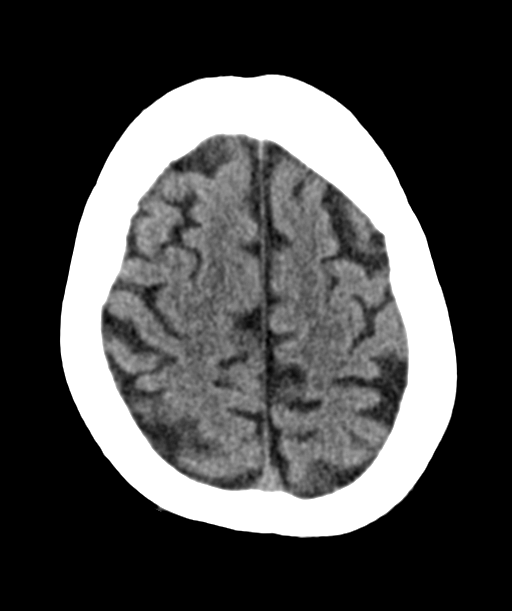
[im 28/32  brain]
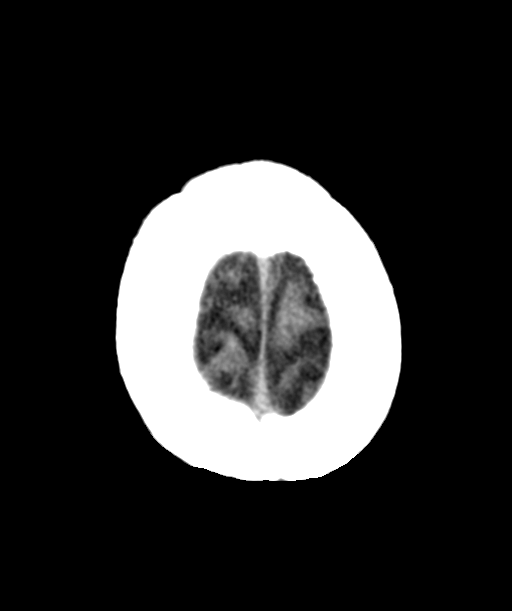

[Series 4: coronal soft tissue · coronal · 0.31mm/px · 3 of 62 slices shown]
[im 21/62  brain]
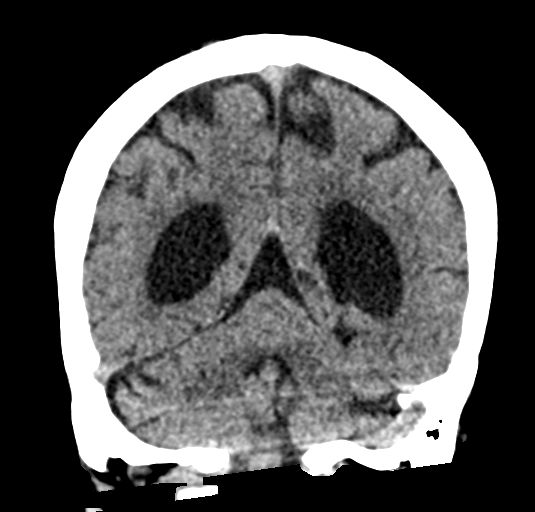
[im 28/62  brain]
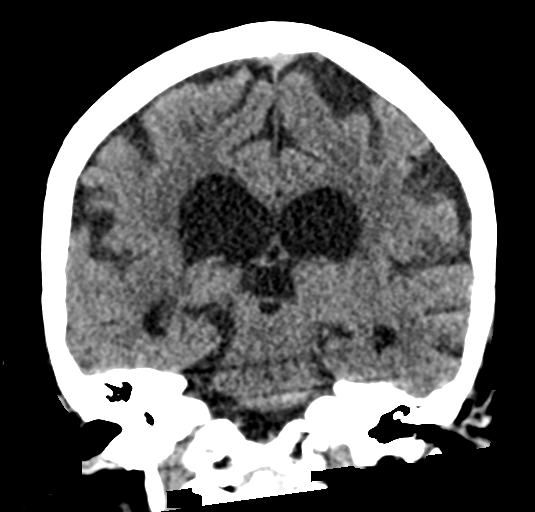
[im 34/62  brain]
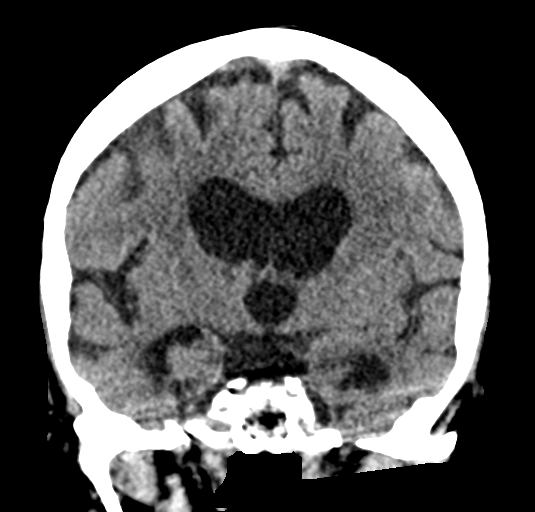

[Series 5: sagittal soft tissue · sagittal · 0.32mm/px · 3 of 50 slices shown]
[im 17/50  brain]
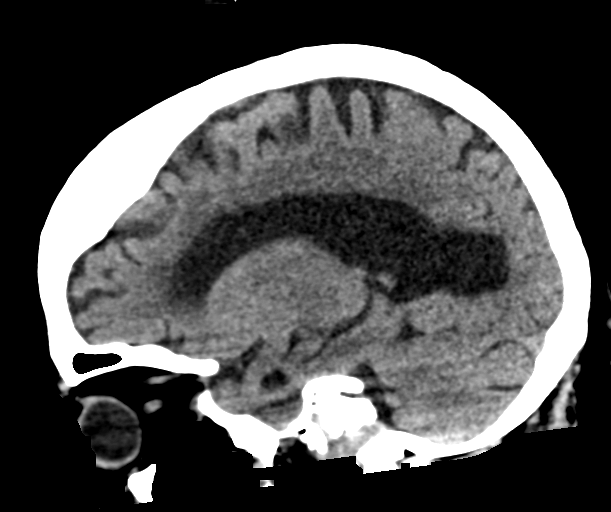
[im 25/50  brain]
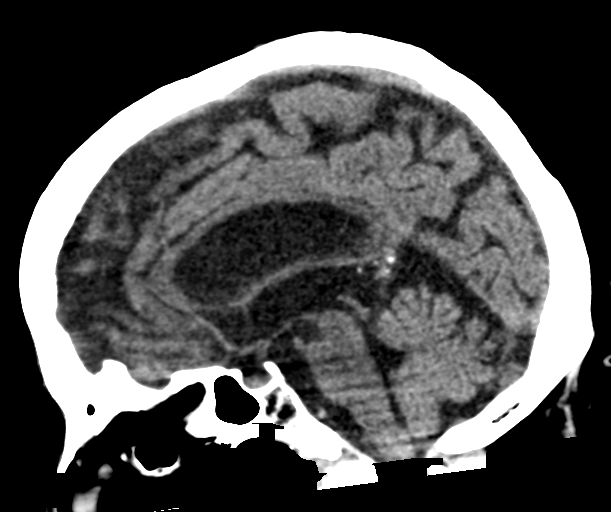
[im 33/50  brain]
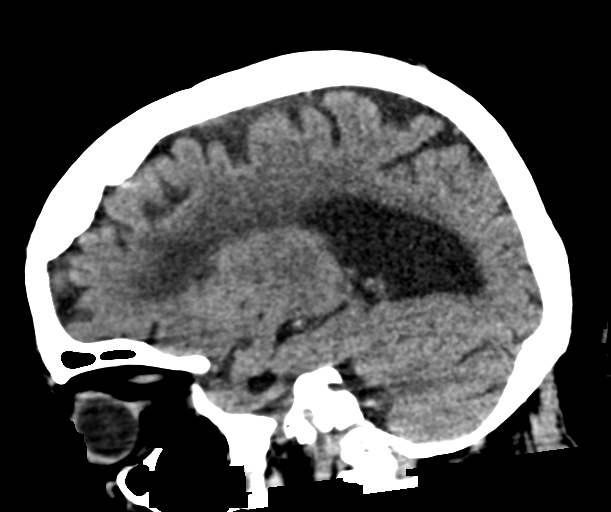

[15 of 47 positions shown; findings below may reference images not displayed]

FINDINGS: Brain: No acute intracranial hemorrhage, mass effect, or midline
shift. No extra-axial fluid collections identified. No evidence of
an acute territorial infarct. Ventricles appear stable and
prominent, possibly out of proportion to the degree of volume loss.
Patchy hypodensities in the periventricular and subcortical white
matter, likely secondary to chronic microvascular ischemic changes.
Punctate old lacunar infarcts in the bilateral basal ganglia
regions.

Vascular: Extensive calcified plaques in the carotid siphons.

Skull: Normal. Negative for fracture or focal lesion.

Sinuses/Orbits: No acute finding.

Other: None.
IMPRESSION: 1. No acute intracranial hemorrhage or mass effect. Stable
prominence of the ventricles which may be out of proportion to
volume loss and could be seen with normal pressure hydrocephalus,
correlate clinically.
2. Chronic microvascular ischemic changes and small old lacunar
infarcts.

## 2022-01-20 IMAGING — CT CT HEAD W/O CM
4 of 5 series · 13 of 47 positions shown, 15 images · non-contrast
Comparison: Head CT dated 01/27/2021.

CLINICAL DATA: Altered mental status.

EXAM:
CT HEAD WITHOUT CONTRAST
TECHNIQUE: Contiguous axial images were obtained from the base of the skull
through the vertex without intravenous contrast.

[Series 2: head wo · axial · 0.40mm/px · z∈[-156,-126]mm · 2 of 35 slices shown]
[im 6/35  brain]
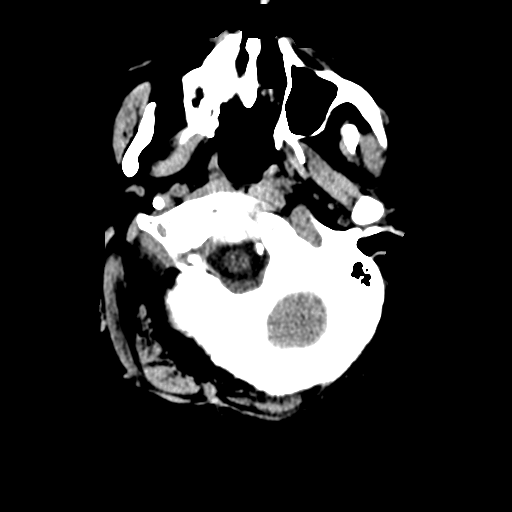
[im 12/35  brain]
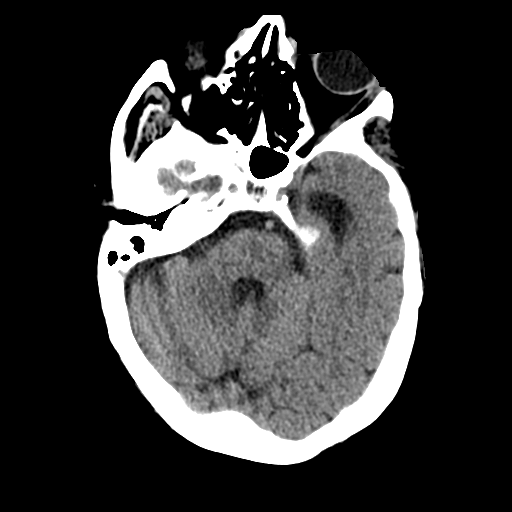

[Series 4: coronal soft tissue · coronal · 0.32mm/px · 3 of 83 slices shown]
[im 28/83  brain]
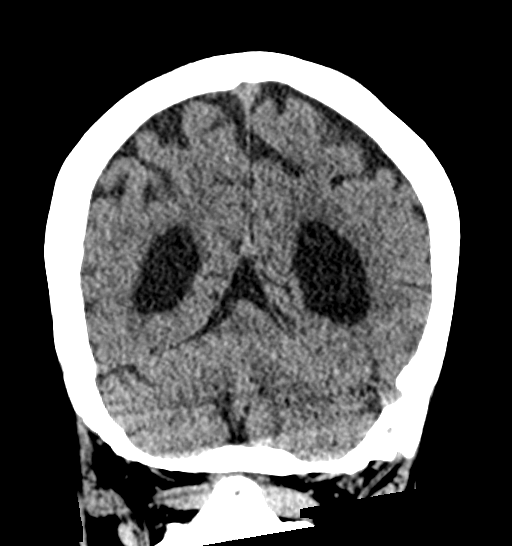
[im 37/83  brain]
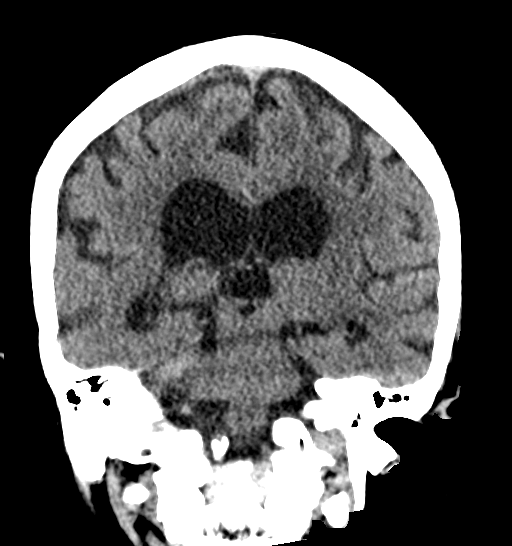
[im 46/83  brain]
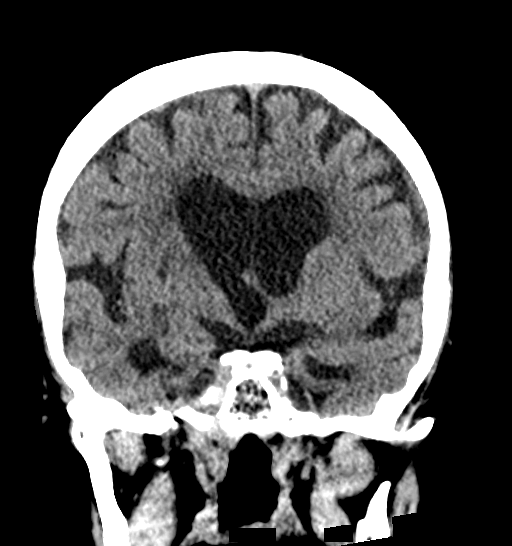

[Series 5: sagittal soft tissue · sagittal · 0.38mm/px · 3 of 56 slices shown]
[im 23/56  brain]
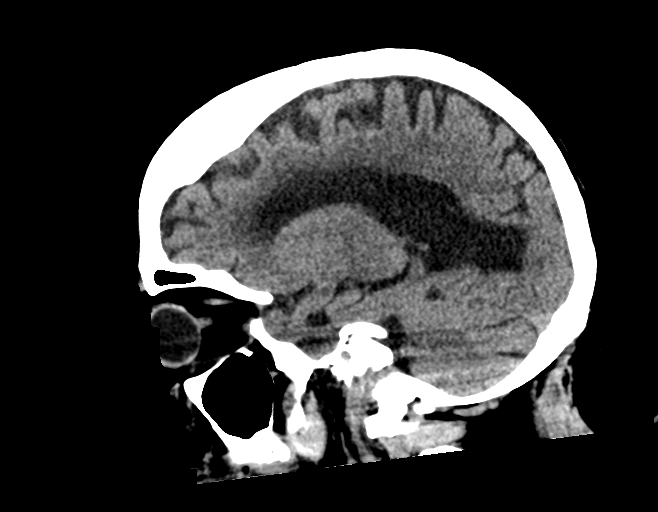
[im 28/56  brain]
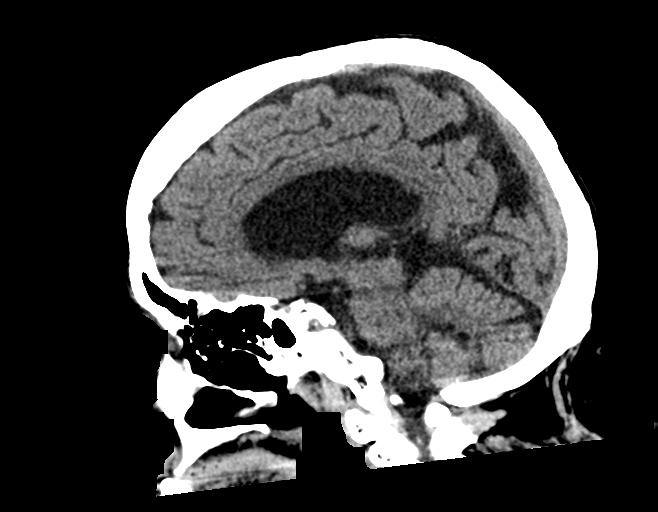
[im 34/56  brain]
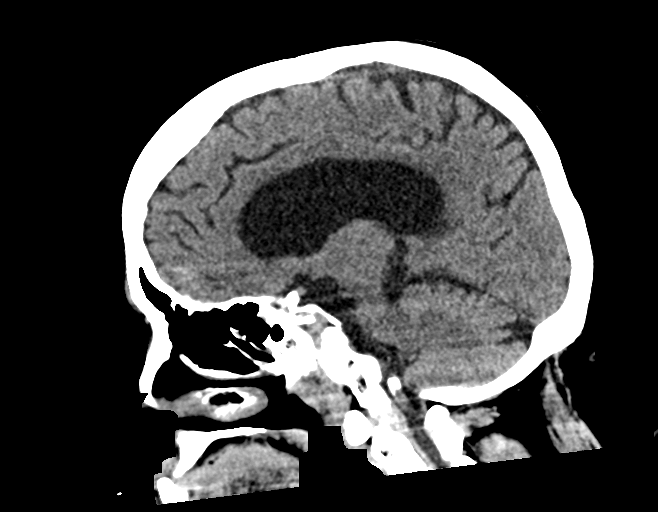

[Series 6: ax head wo · axial · 0.32mm/px · z∈[-135,-10]mm · 5 of 40 slices shown, 7 images]
[im 7/40  brain]
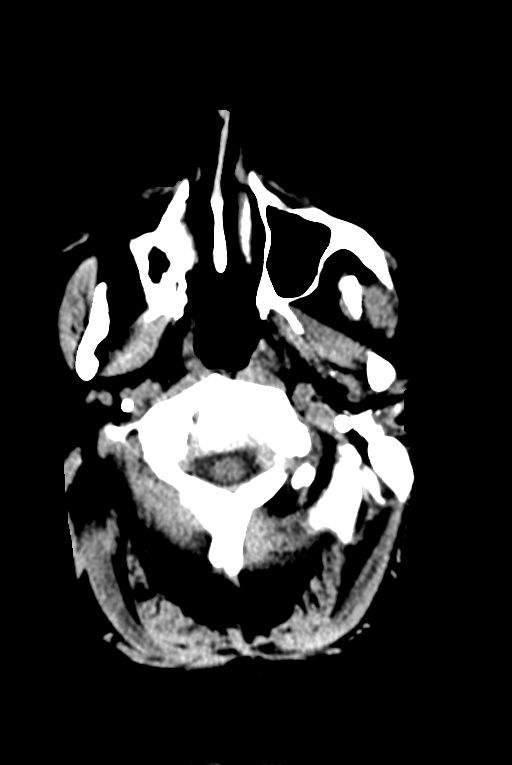
[im 7/40  bone]
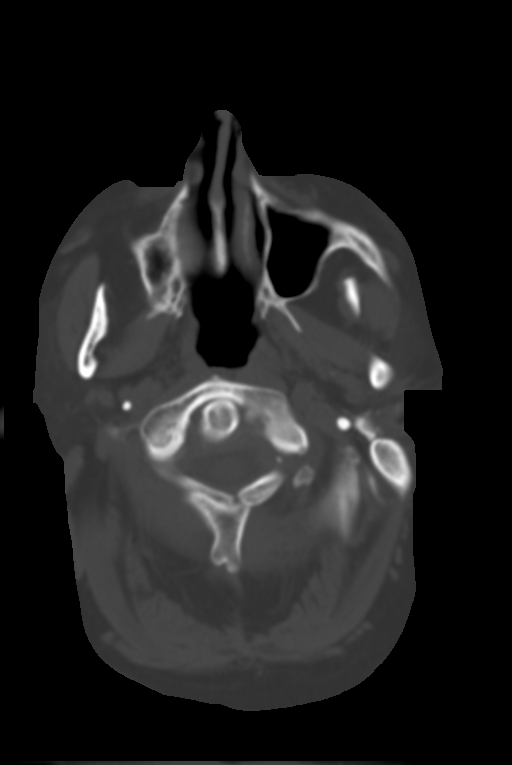
[im 14/40  brain]
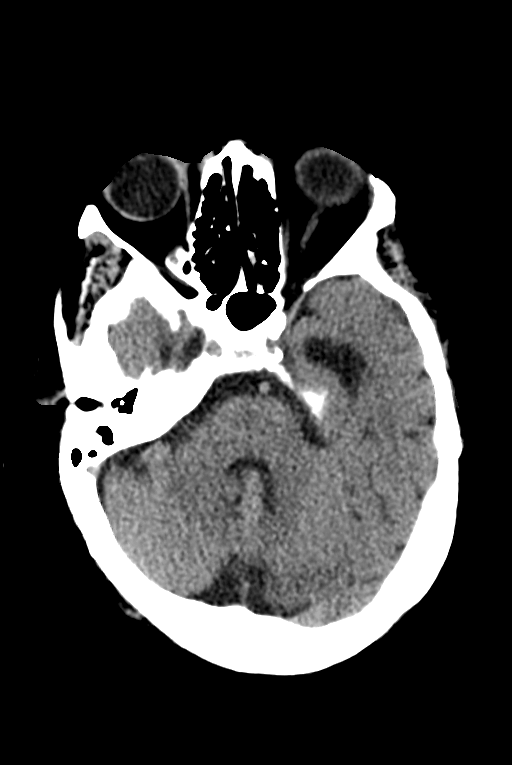
[im 20/40  brain]
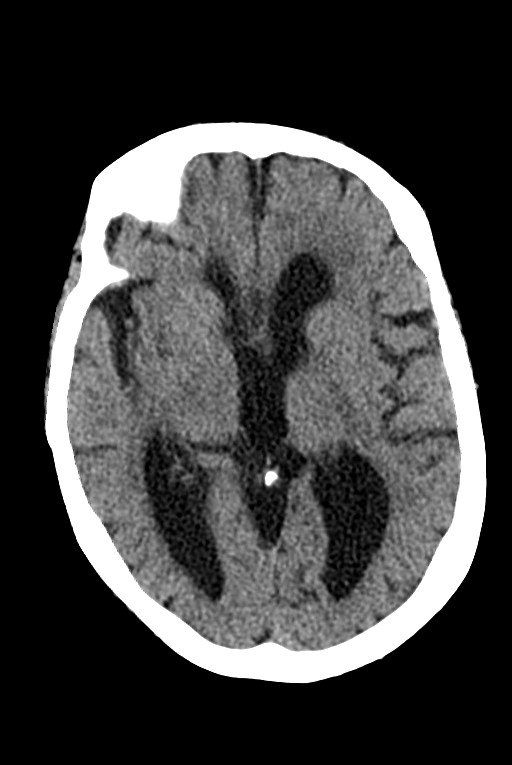
[im 27/40  brain]
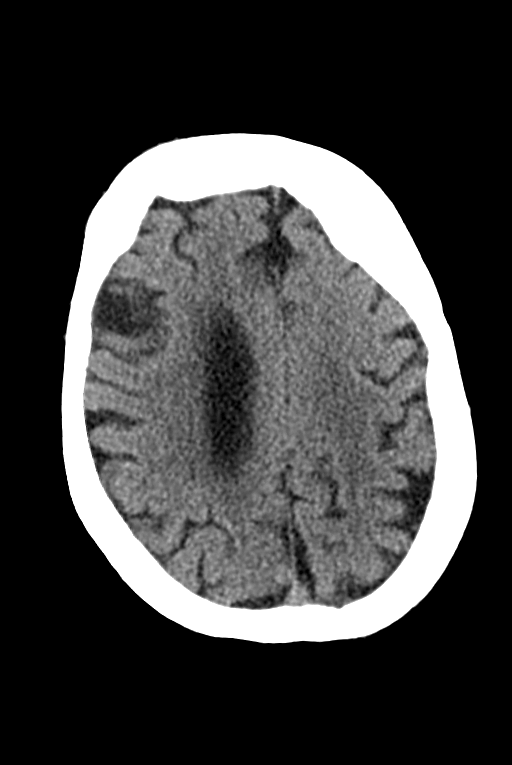
[im 33/40  brain]
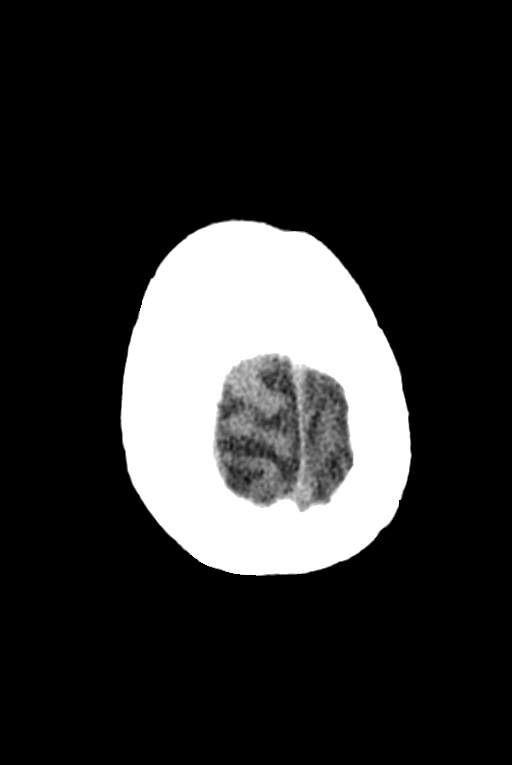
[im 33/40  bone]
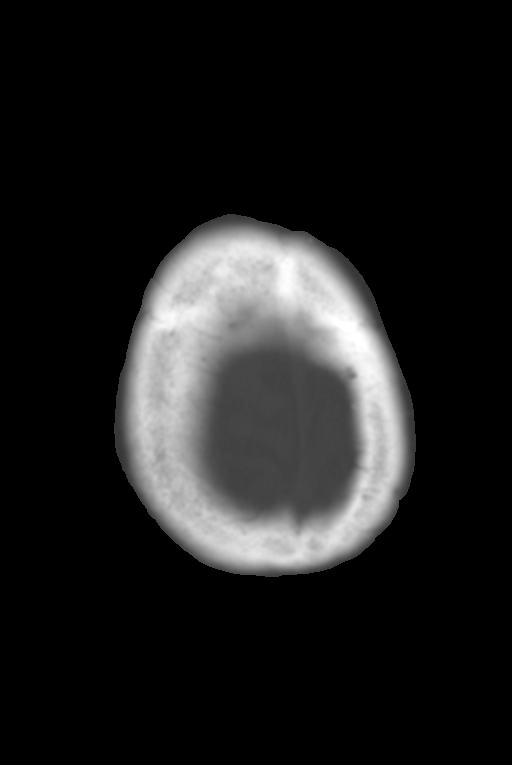

[13 of 47 positions shown; findings below may reference images not displayed]

FINDINGS: Brain: Mild age-related atrophy and chronic microvascular ischemic
changes. Similar mild dilatation of the lateral ventricles which may
represent central volume loss versus normal pressure hydrocephalus.
Clinical correlation recommended. There is no acute intracranial
hemorrhage. No mass effect midline shift. No extra-axial fluid
collection.

Vascular: No hyperdense vessel or unexpected calcification.

Skull: Normal. Negative for fracture or focal lesion.

Sinuses/Orbits: No acute finding.

Other: None
IMPRESSION: 1. No acute intracranial pathology.
2. Mild age-related atrophy and chronic microvascular ischemic
changes.
3. Similar mild dilatation of the lateral ventricles which may
represent central volume loss versus normal pressure hydrocephalus.

## 2022-01-22 IMAGING — CR DG CHEST 1V
1 series · 1 of 1 positions shown · non-contrast
Comparison: 12/28/2020

CLINICAL DATA: Fall

EXAM:
CHEST  1 VIEW

[dg chest 2 view]
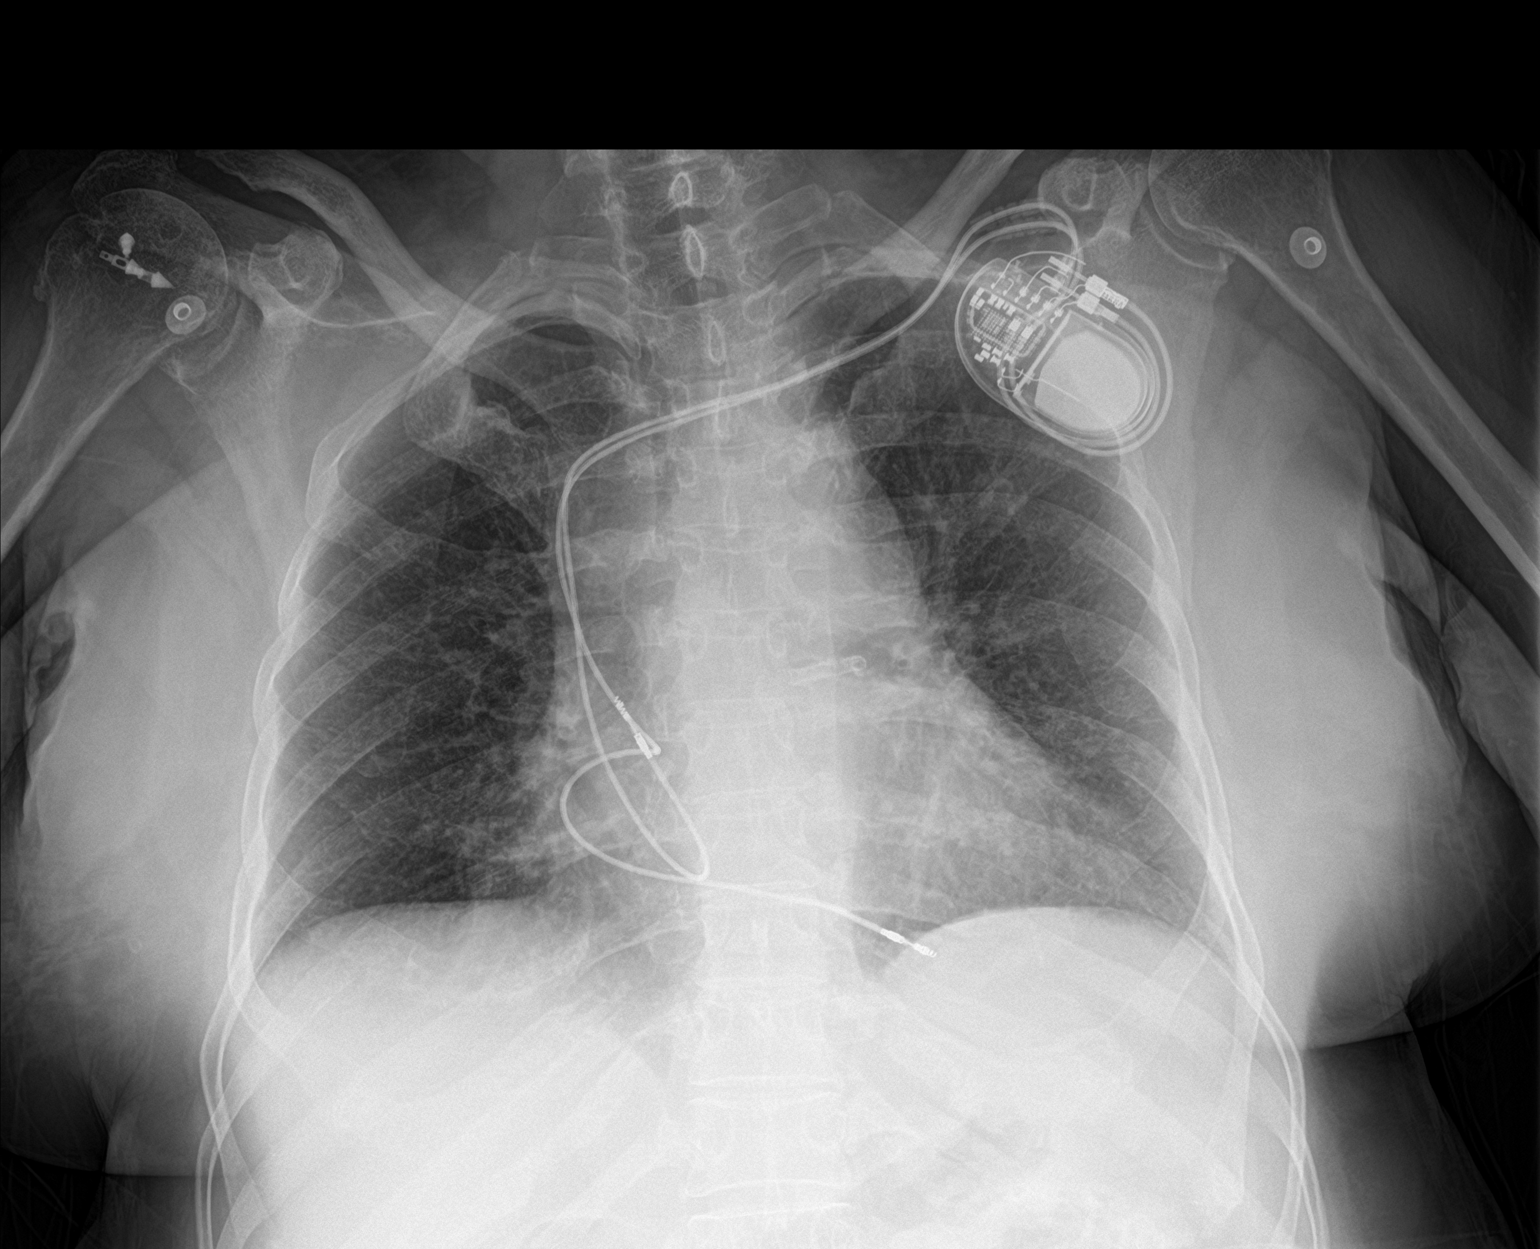

[1 of 1 positions shown; findings below may reference images not displayed]

FINDINGS: Left pacer remains in place, unchanged. Heart is upper limits normal
in size. No confluent airspace opacities or effusions. No acute bony
abnormality.
IMPRESSION: No active disease.

## 2022-01-22 IMAGING — CT CT CERVICAL SPINE W/O CM
4 of 5 series · 13 of 28 positions shown, 14 images · non-contrast
Comparison: 01/08/2021

CLINICAL DATA: Fall.  Neck pain, acute, no red flags

EXAM:
CT CERVICAL SPINE WITHOUT CONTRAST
TECHNIQUE: Multidetector CT imaging of the cervical spine was performed without
intravenous contrast. Multiplanar CT image reconstructions were also
generated.

[Series 3: c spine soft · axial · 0.35mm/px · z∈[-188,-126]mm · 2 of 95 slices shown (1 of 2)]
[im 32/95  soft-tissue]
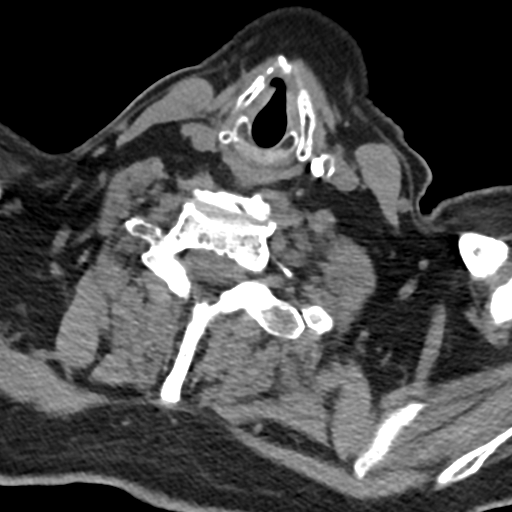
[im 63/95  soft-tissue]
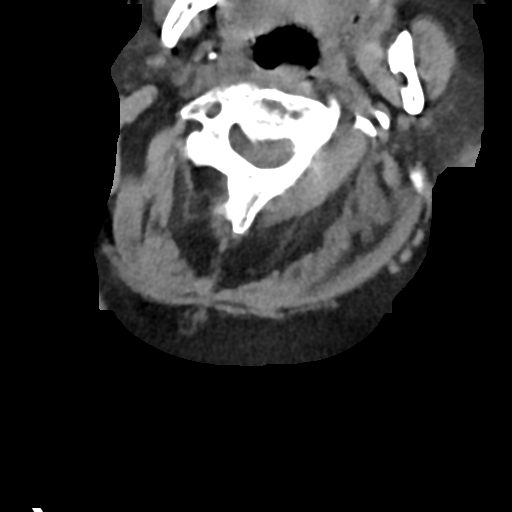

[Series 5: c spine soft · axial · 0.35mm/px · z∈[-188,-126]mm · 2 of 95 slices shown (2 of 2)]
[im 32/95  soft-tissue]
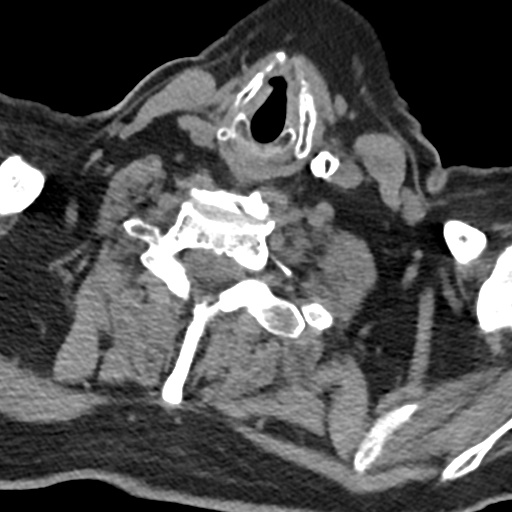
[im 63/95  soft-tissue]
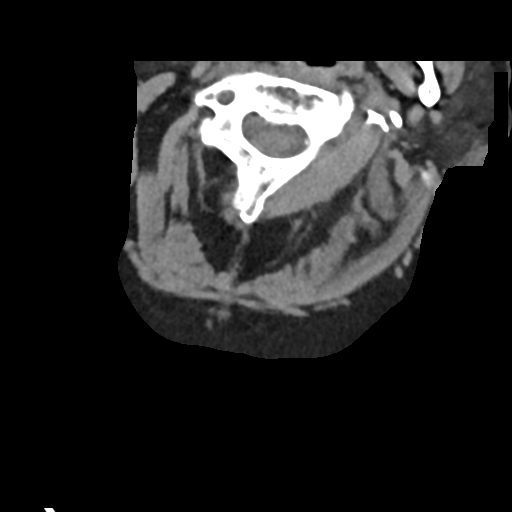

[Series 6: sagittal bone · sagittal · 0.24mm/px · 5 of 79 slices shown]
[im 16/79  bone]
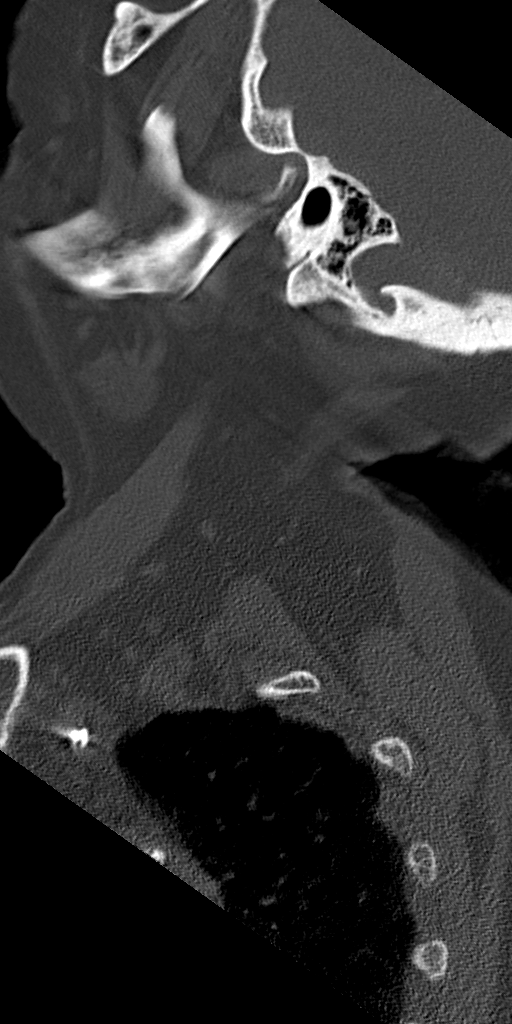
[im 20/79  soft-tissue]
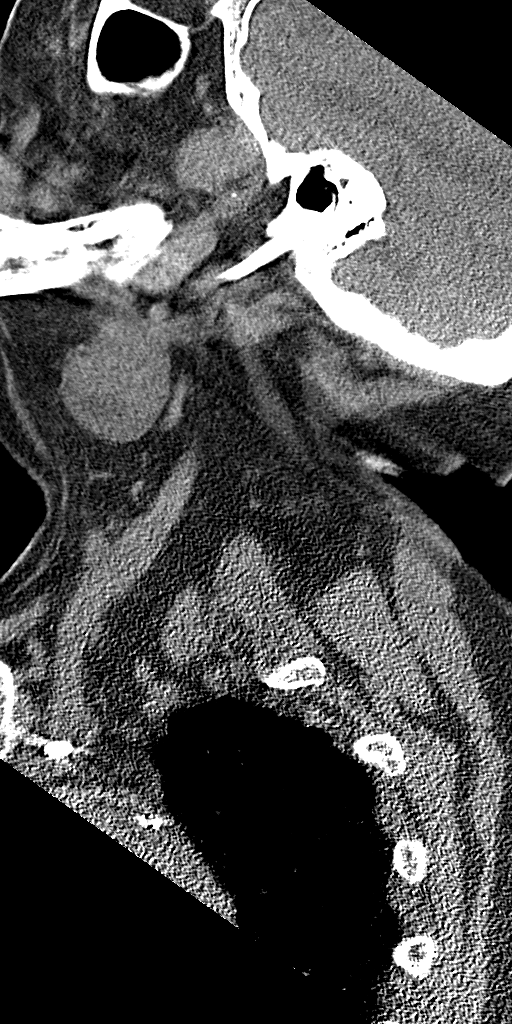
[im 32/79  bone]
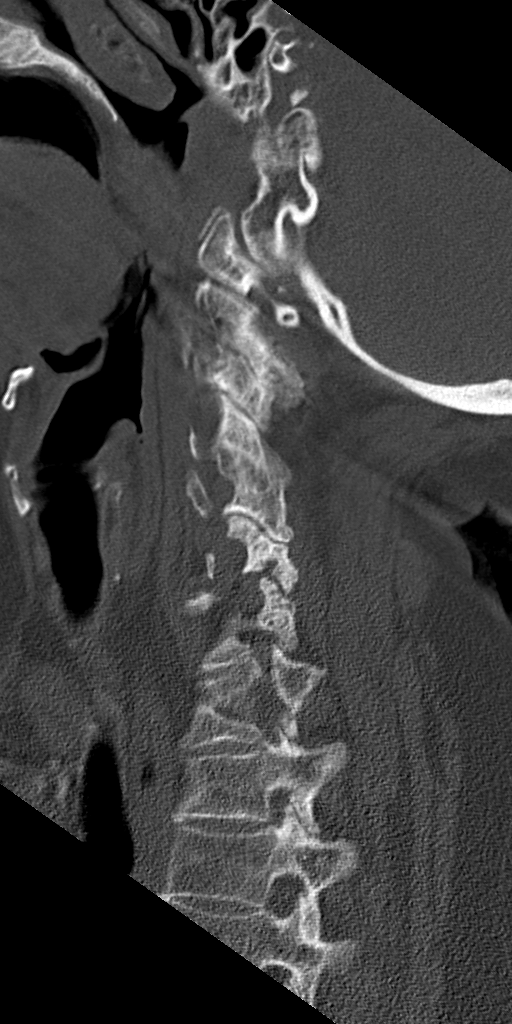
[im 47/79  bone]
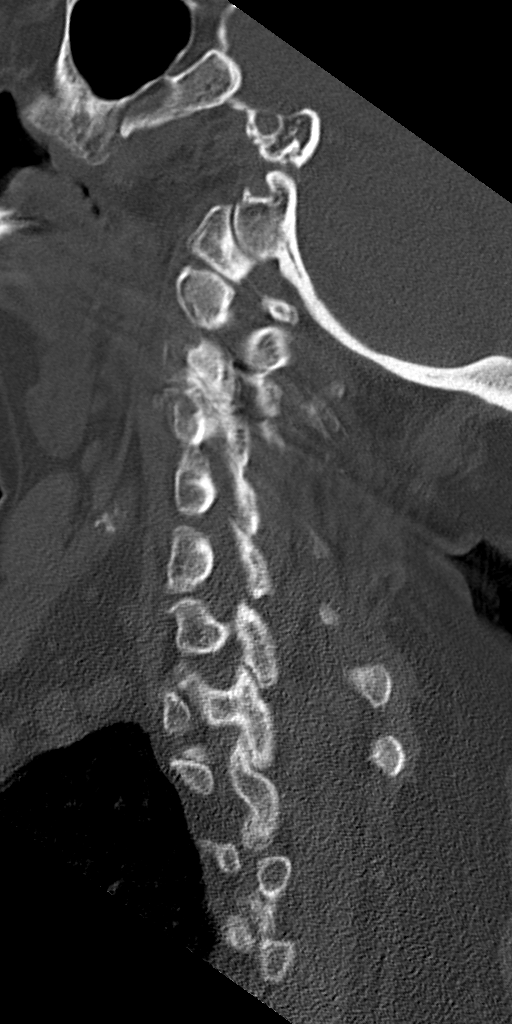
[im 63/79  bone]
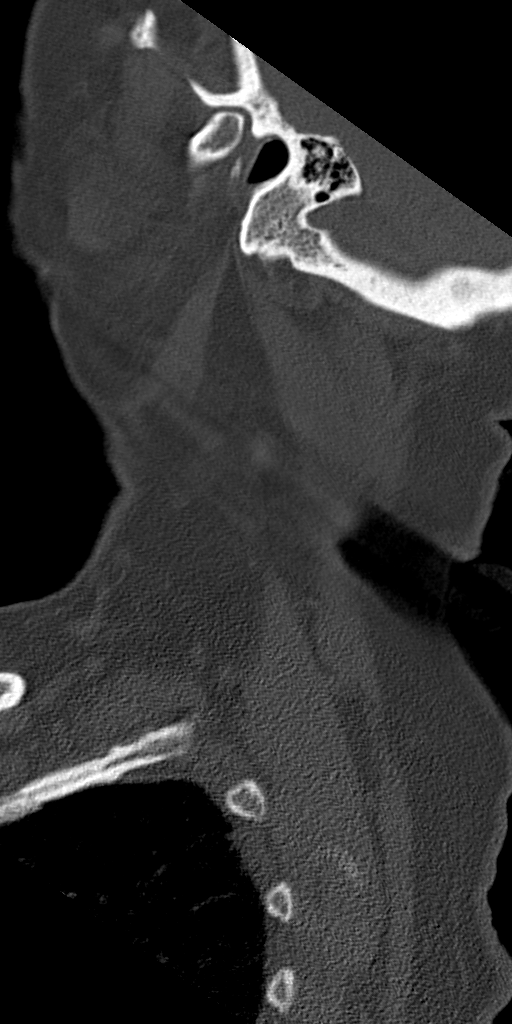

[Series 8: orthogonal bone · axial · 0.28mm/px · z∈[-256,-135]mm · 4 of 127 slices shown, 5 images]
[im 26/127  soft-tissue]
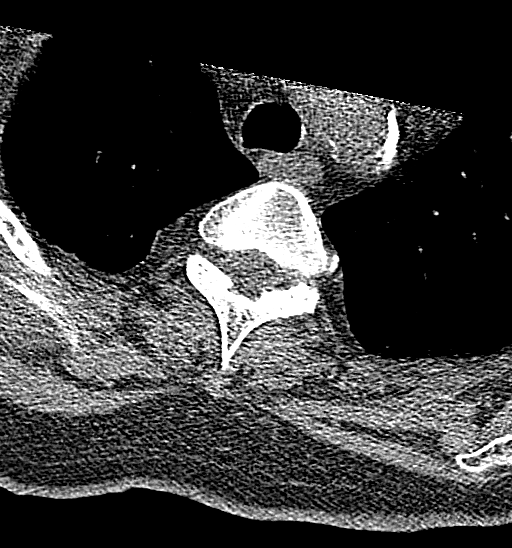
[im 26/127  bone]
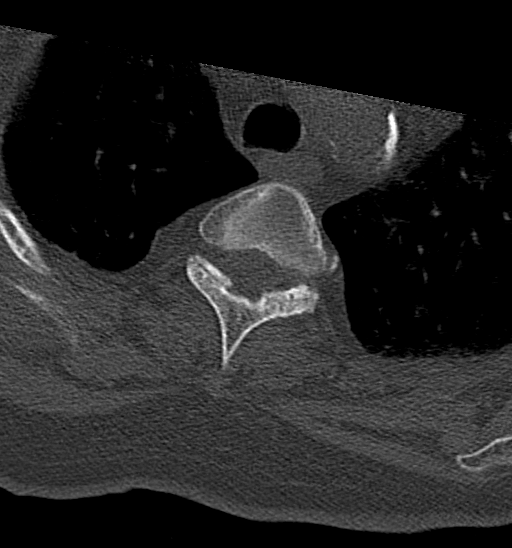
[im 51/127  bone]
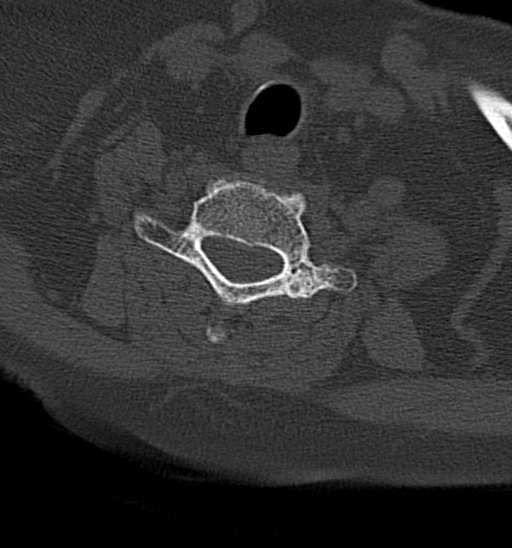
[im 76/127  bone]
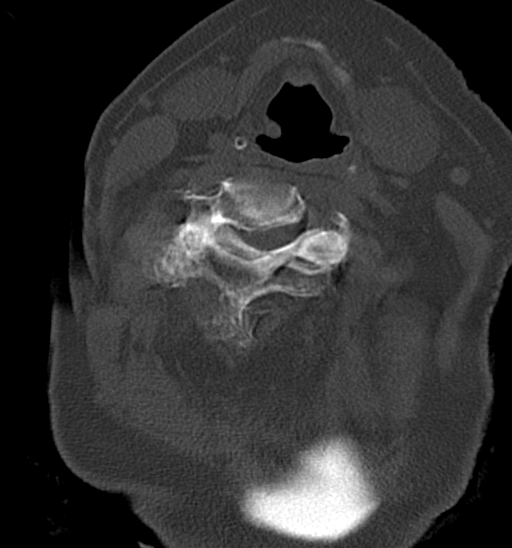
[im 101/127  bone]
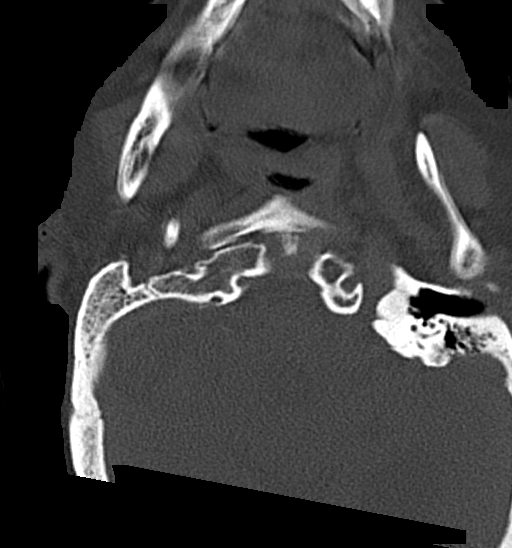

[13 of 28 positions shown; findings below may reference images not displayed]

FINDINGS: Alignment: No subluxation.

Skull base and vertebrae: No acute fracture. No primary bone lesion
or focal pathologic process.

Soft tissues and spinal canal: No prevertebral fluid or swelling. No
visible canal hematoma.

Disc levels: Bilateral degenerative facet disease. Early
degenerative disc disease changes.

Upper chest: No acute findings

Other: None
IMPRESSION: Moderate spondylosis.

No acute bony abnormality.

## 2022-01-22 IMAGING — CR DG HIP (WITH OR WITHOUT PELVIS) 2-3V*R*
1 series · 2 of 2 positions shown · non-contrast
Comparison: 12/28/2020

CLINICAL DATA: Un witnessed fall

EXAM:
DG HIP (WITH OR WITHOUT PELVIS) 2-3V LEFT; DG HIP (WITH OR WITHOUT
PELVIS) 2-3V RIGHT

[Series 1: dg hip unilat w or w/o pelvis 2-3 views  · non-contrast · 0.14mm/px · 2 of 2 slices shown]
[im 1/2]
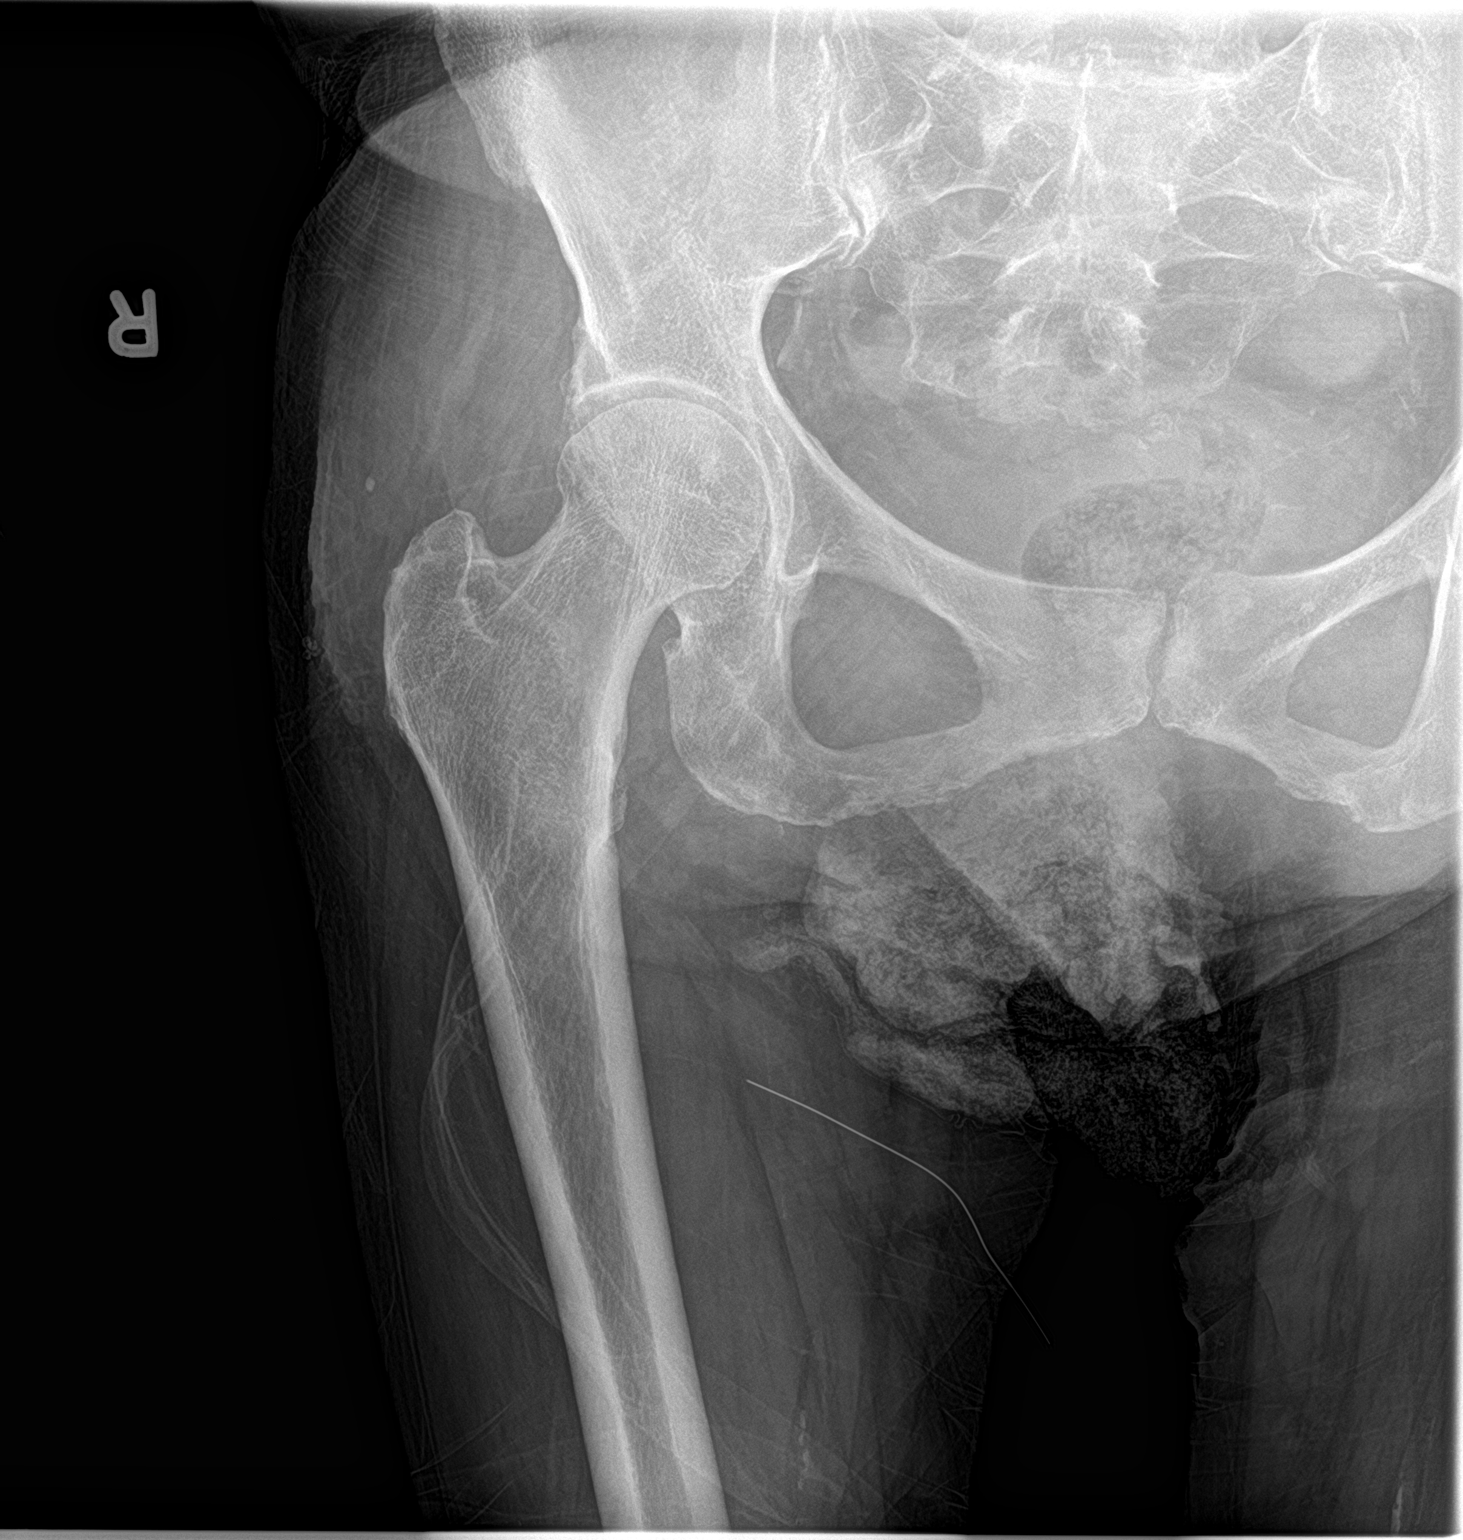
[im 2/2]
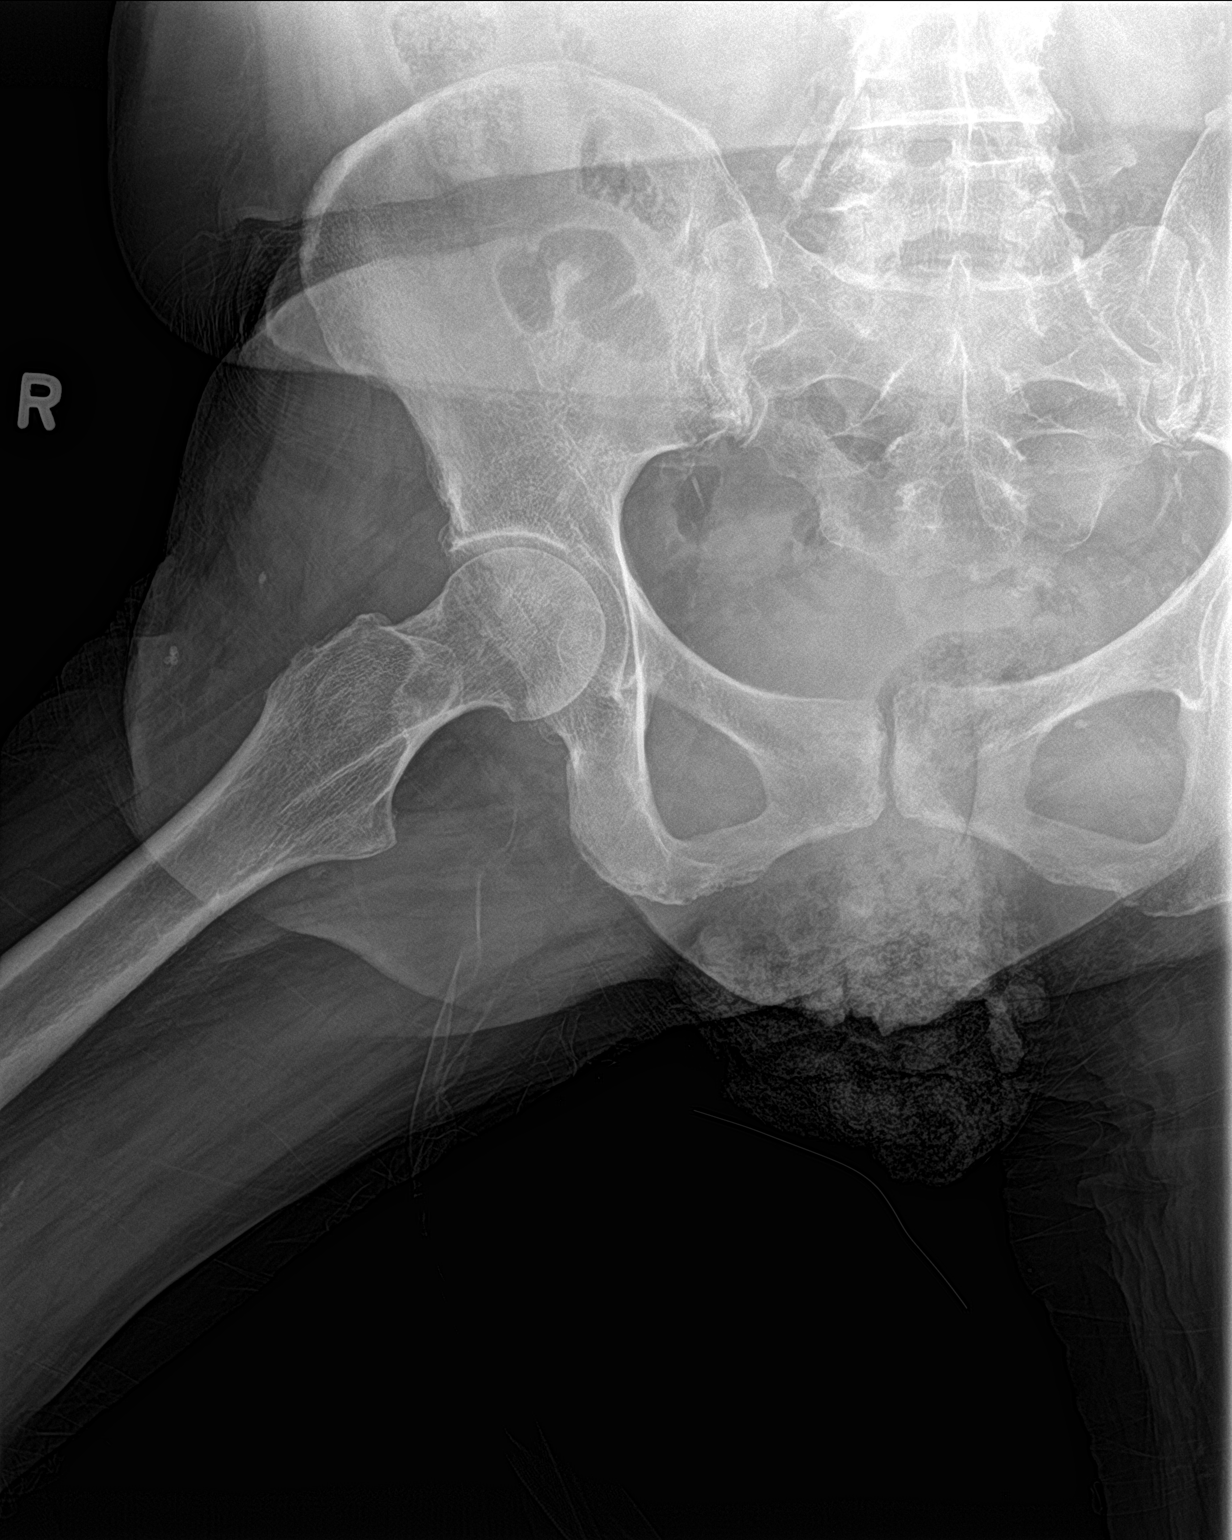

[2 of 2 positions shown; findings below may reference images not displayed]

FINDINGS: Frontal view of the pelvis as well as frontal and frogleg lateral
views of both hips are obtained.

Right hip: No acute fracture, subluxation, or dislocation. Mild
joint space narrowing unchanged since prior study. Visualized
portions of the right hemipelvis are unremarkable.

Left hip: No acute fracture, subluxation, or dislocation. Mild joint
space narrowing unchanged since prior study. Visualized portions of
the left hemipelvis are unremarkable.
IMPRESSION: 1. Stable mild symmetrical bilateral hip osteoarthritis.
2. No acute displaced hip fracture.

## 2022-01-22 IMAGING — CT CT HEAD W/O CM
4 series · 16 of 47 positions shown, 18 images · non-contrast
Comparison: None.

CLINICAL DATA: Head trauma, moderate-severe.  Fall.

EXAM:
CT HEAD WITHOUT CONTRAST
TECHNIQUE: Contiguous axial images were obtained from the base of the skull
through the vertex without intravenous contrast.

[Series 2: head wo · axial · 0.45mm/px · z∈[-101,+34]mm · 7 of 37 slices shown, 9 images]
[im 5/37  brain]
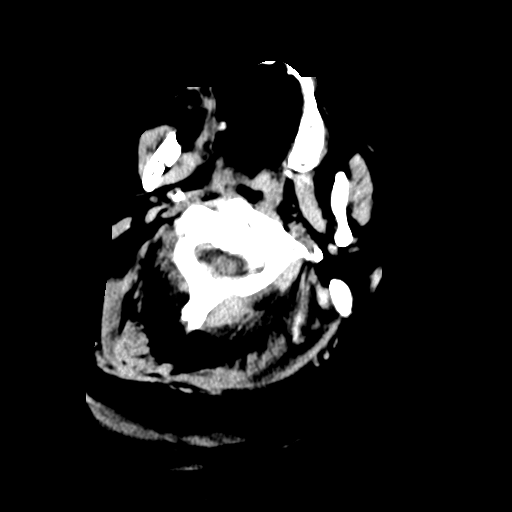
[im 5/37  bone]
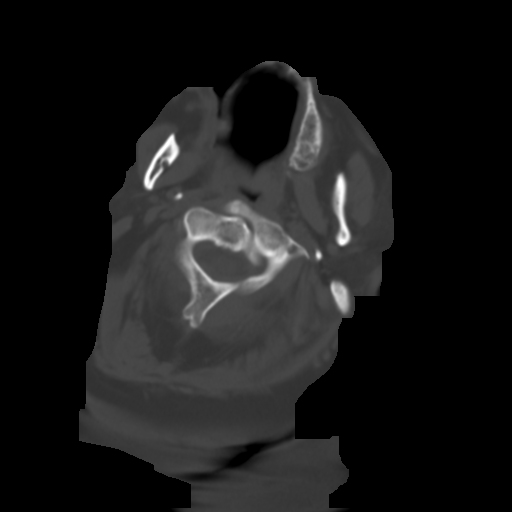
[im 10/37  brain]
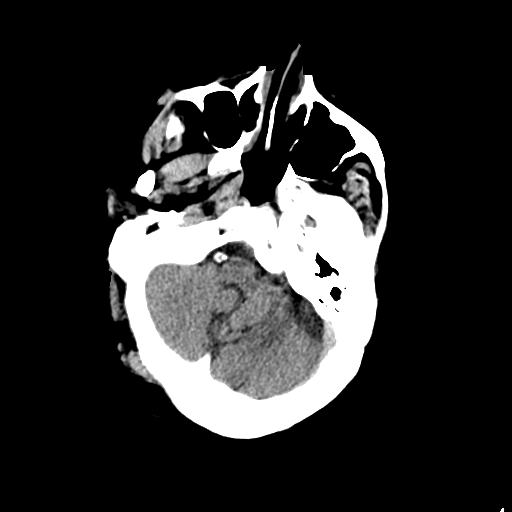
[im 14/37  brain]
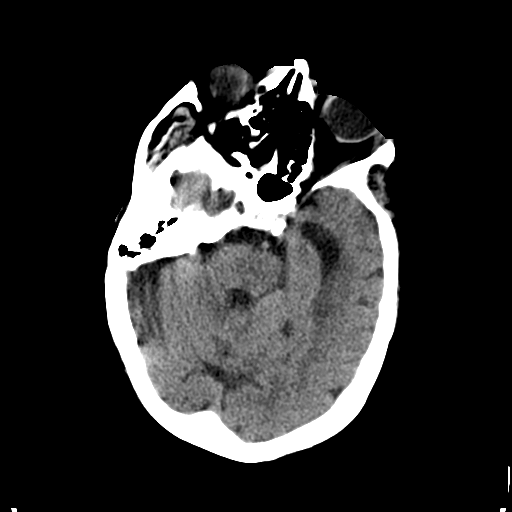
[im 19/37  brain]
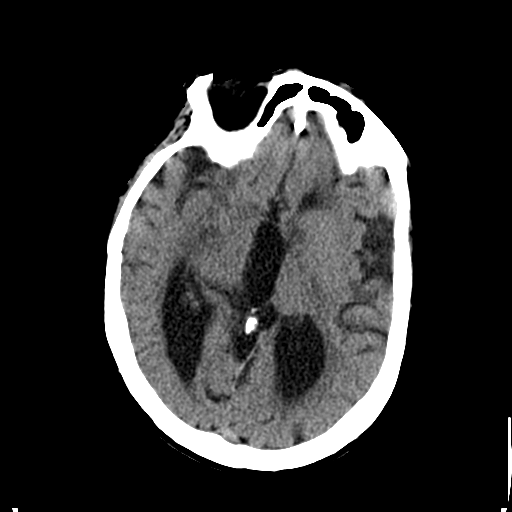
[im 23/37  brain]
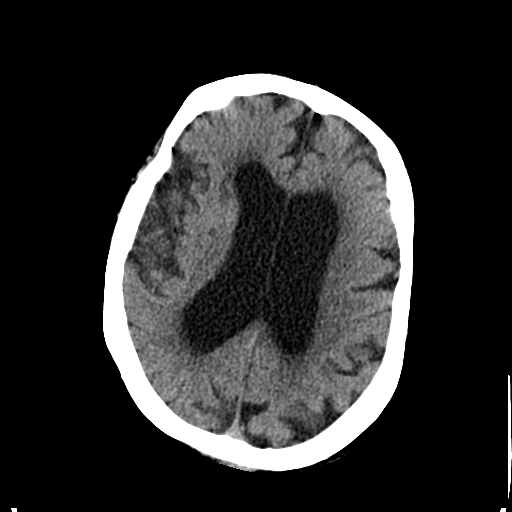
[im 23/37  bone]
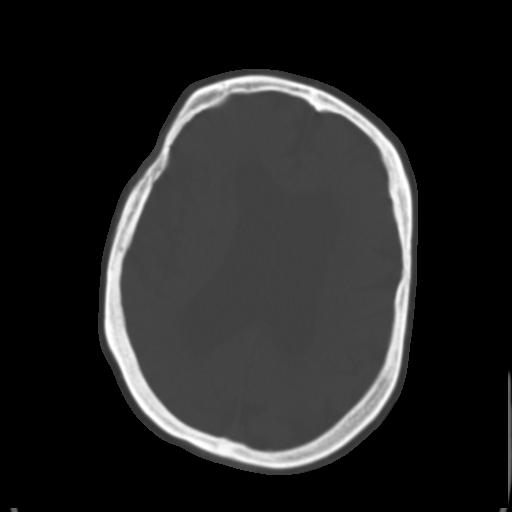
[im 28/37  brain]
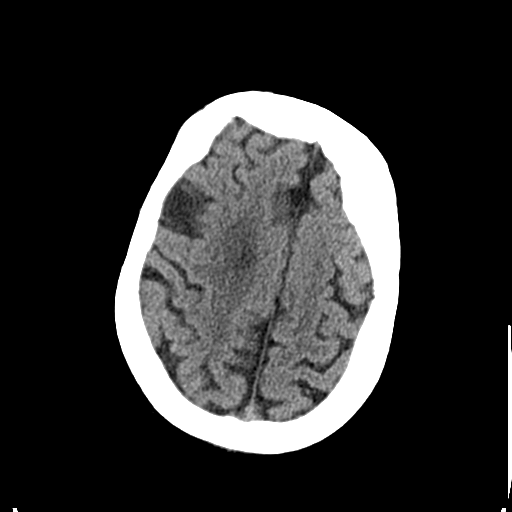
[im 32/37  brain]
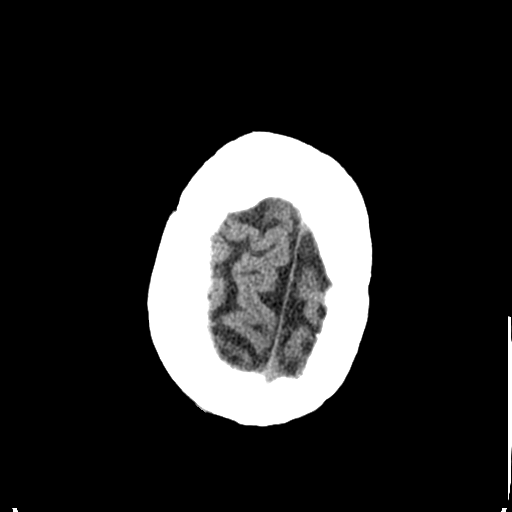

[Series 3: head bone · axial · 0.45mm/px · z∈[-103,-67]mm · 3 of 92 slices shown]
[im 10/92  bone]
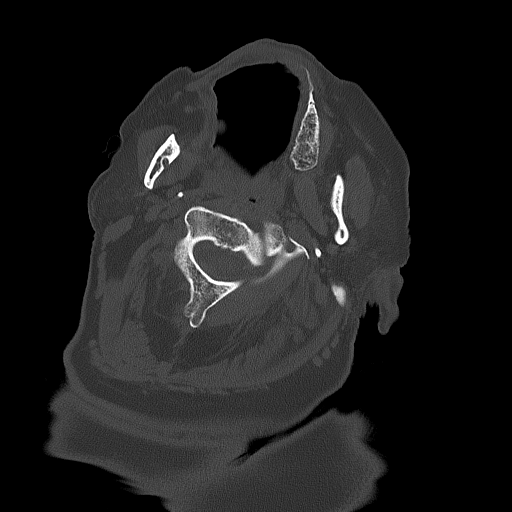
[im 19/92  bone]
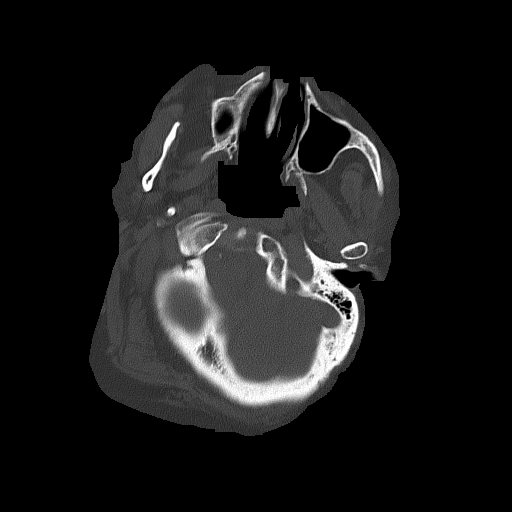
[im 28/92  bone]
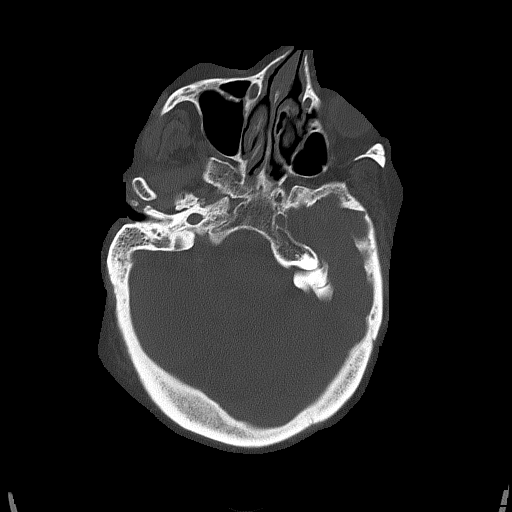

[Series 4: coronal soft tissue · coronal · 0.32mm/px · 3 of 82 slices shown]
[im 28/82  brain]
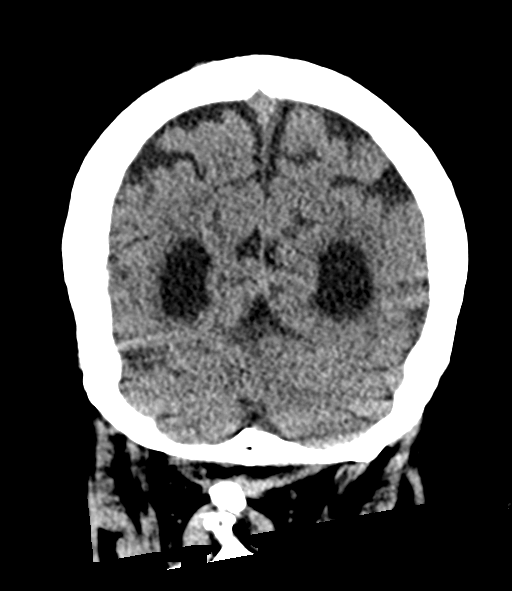
[im 37/82  brain]
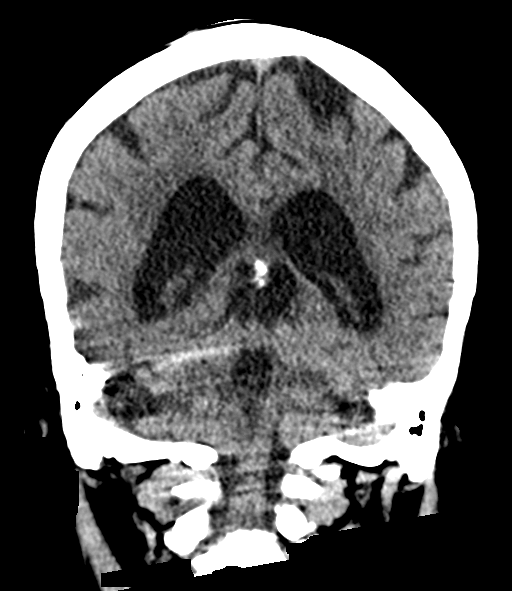
[im 46/82  brain]
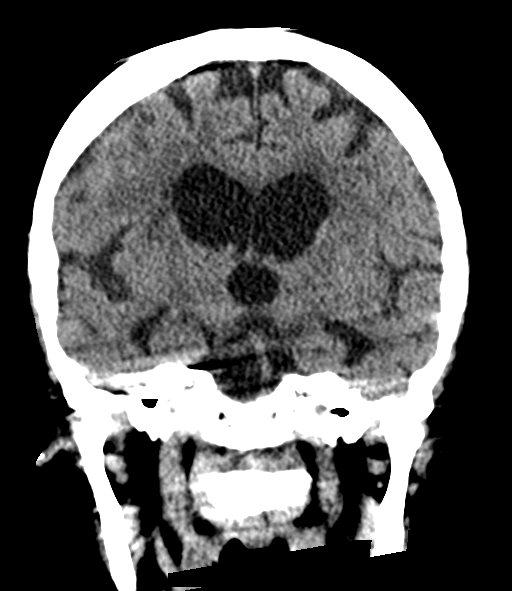

[Series 5: sagittal soft tissue · sagittal · 0.37mm/px · 3 of 55 slices shown]
[im 23/55  brain]
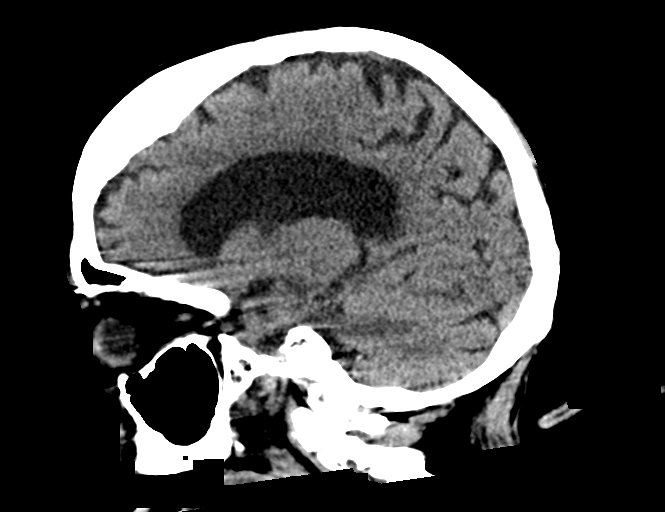
[im 28/55  brain]
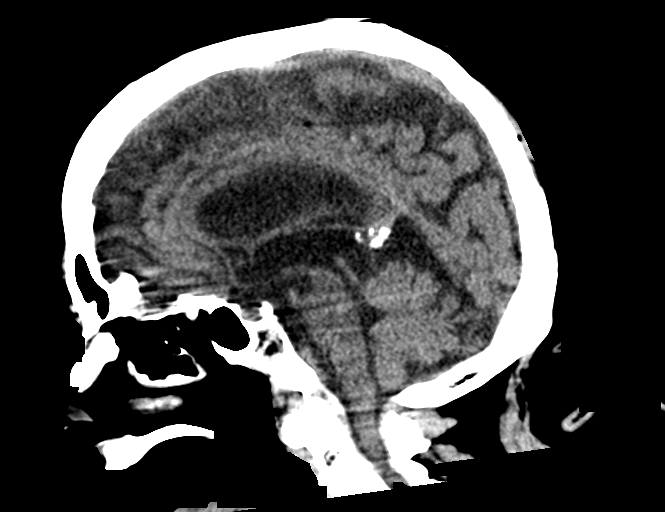
[im 33/55  brain]
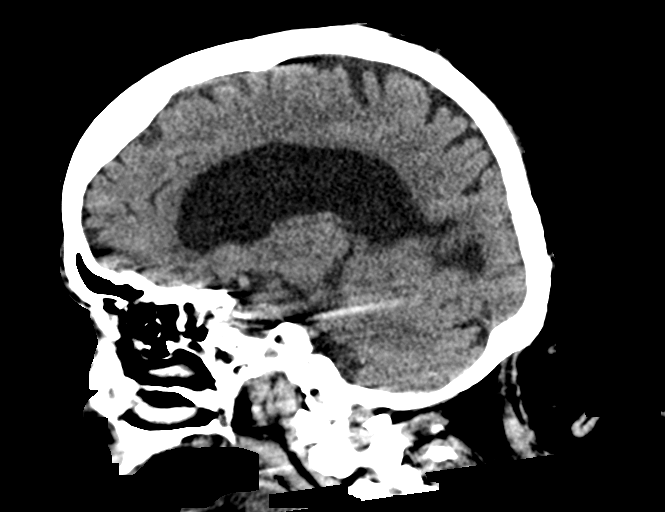

[16 of 47 positions shown; findings below may reference images not displayed]

FINDINGS: Brain: There is atrophy and chronic small vessel disease changes. No
acute intracranial abnormality. Specifically, no hemorrhage,
hydrocephalus, mass lesion, acute infarction, or significant
intracranial injury.

Vascular: No hyperdense vessel or unexpected calcification.

Skull: No acute calvarial abnormality.

Sinuses/Orbits: No acute findings

Other: None
IMPRESSION: Atrophy, chronic microvascular disease.

No acute intracranial abnormality.

## 2022-01-25 IMAGING — CT CT CERVICAL SPINE W/O CM
3 of 4 series · 14 of 33 positions shown, 17 images · non-contrast
Comparison: No priors.

CLINICAL DATA: 75-year-old female with history of trauma from a
fall. Trauma to the head and neck.

EXAM:
CT HEAD WITHOUT CONTRAST
CT CERVICAL SPINE WITHOUT CONTRAST
TECHNIQUE: Multidetector CT imaging of the head and cervical spine was
performed following the standard protocol without intravenous
contrast. Multiplanar CT image reconstructions of the cervical spine
were also generated.

[Series 4: sagittal bone · sagittal · 0.28mm/px · 5 of 71 slices shown, 6 images]
[im 24/71  bone]
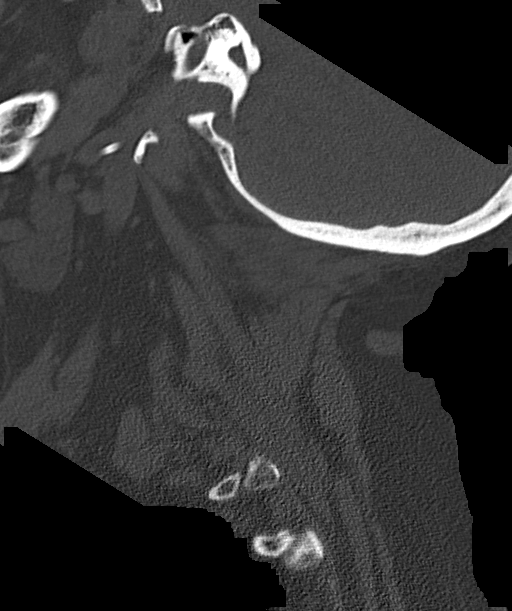
[im 30/71  bone]
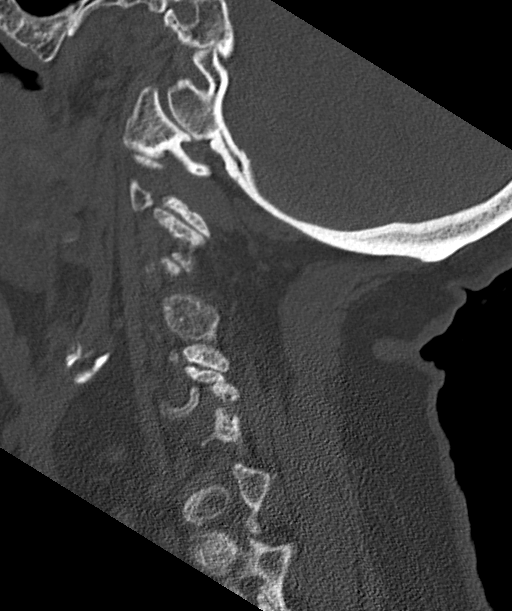
[im 36/71  soft-tissue]
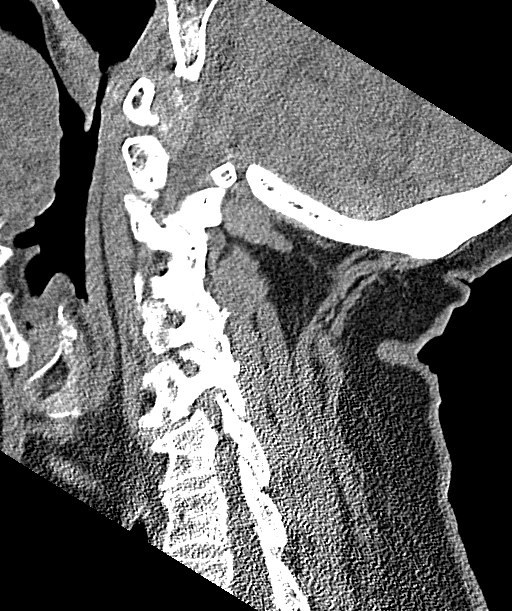
[im 36/71  bone]
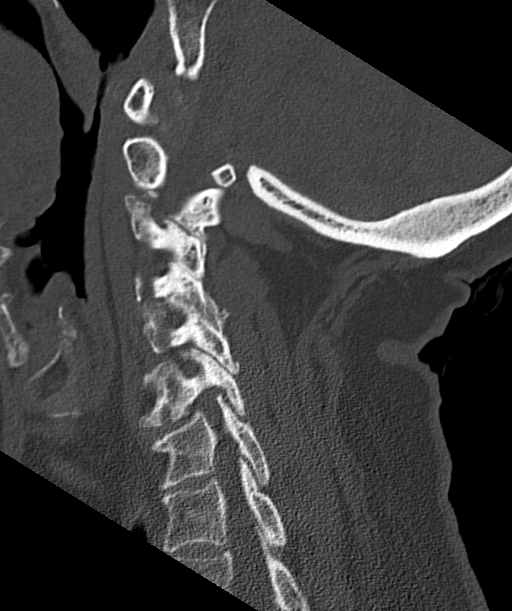
[im 41/71  bone]
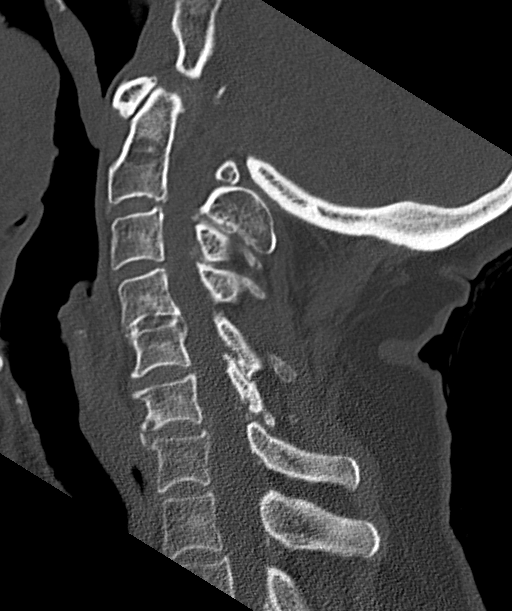
[im 47/71  bone]
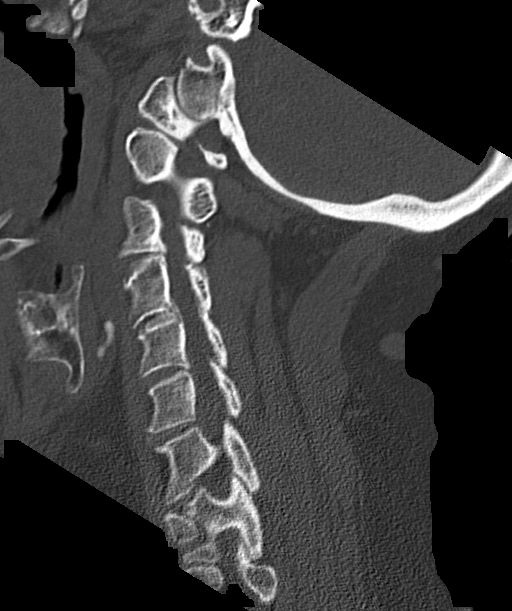

[Series 5: coronal bone · coronal · 0.28mm/px · 3 of 80 slices shown]
[im 26/80  bone]
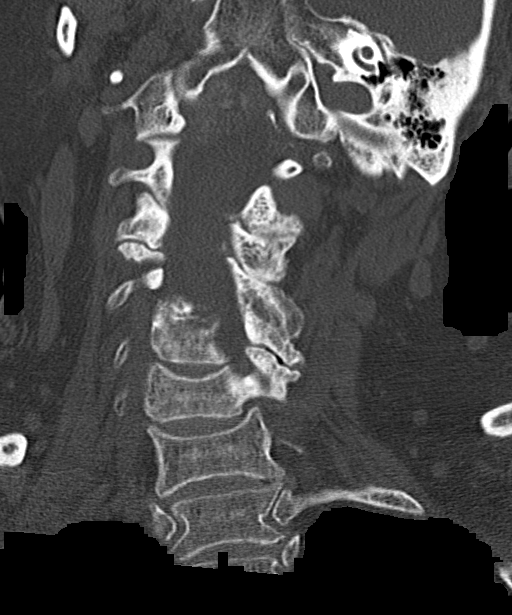
[im 35/80  bone]
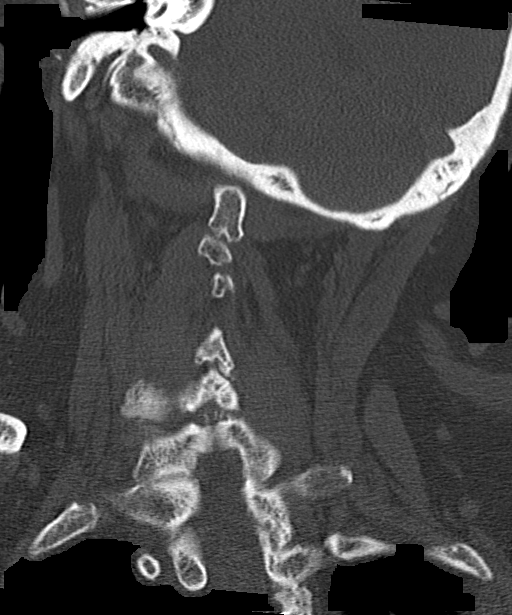
[im 45/80  bone]
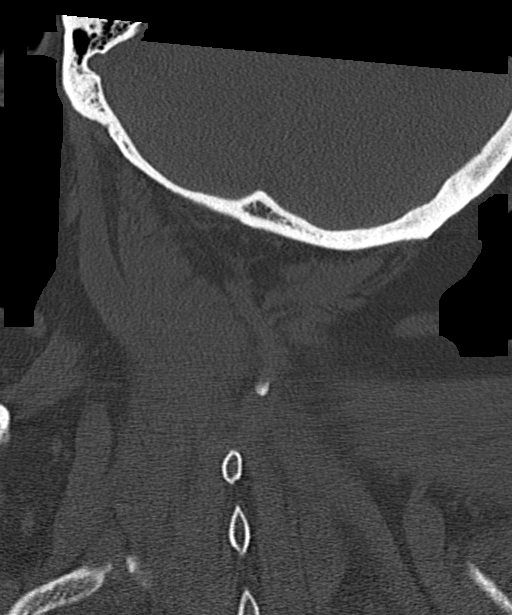

[Series 6: orthogonal bone · axial · 0.27mm/px · z∈[-299,-194]mm · 6 of 87 slices shown, 8 images]
[im 13/87  soft-tissue]
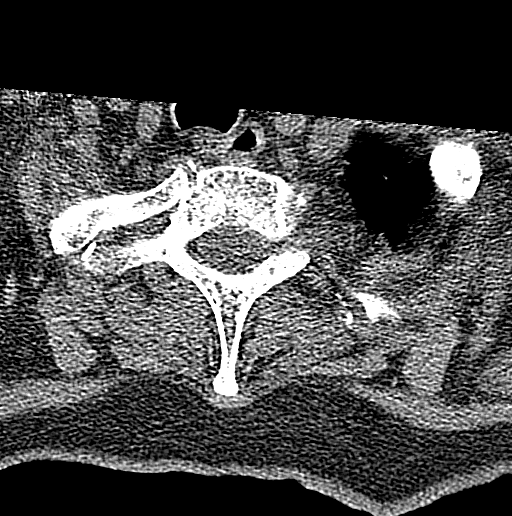
[im 13/87  bone]
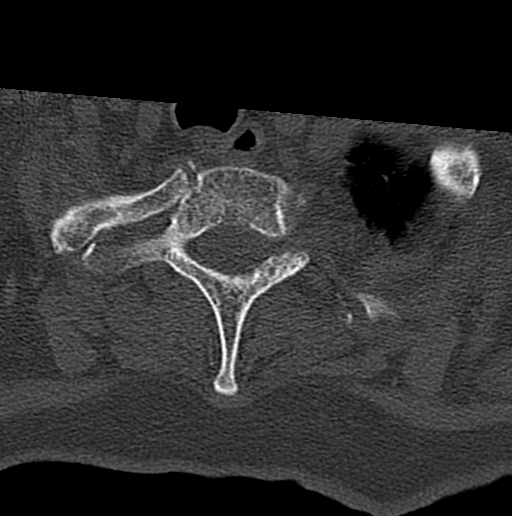
[im 25/87  bone]
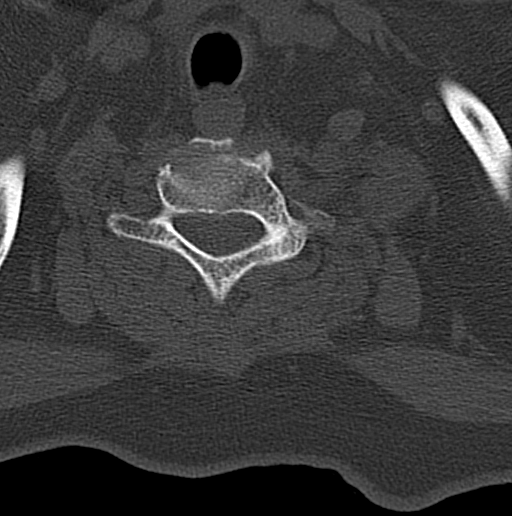
[im 37/87  bone]
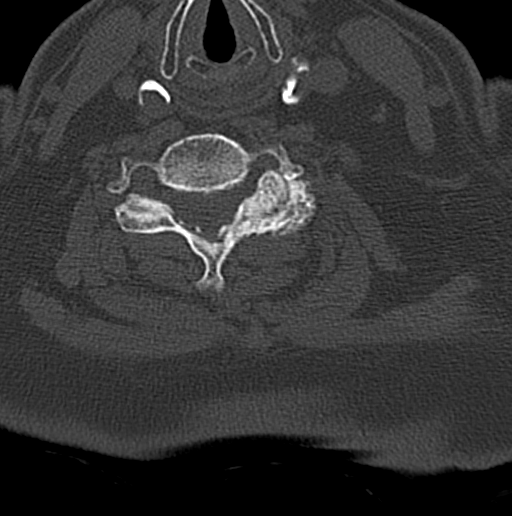
[im 50/87  bone]
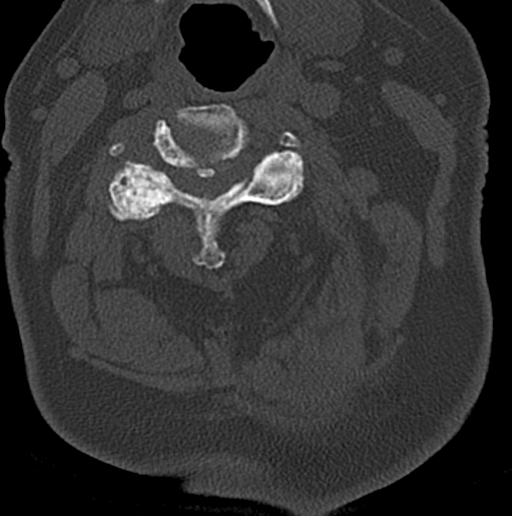
[im 62/87  soft-tissue]
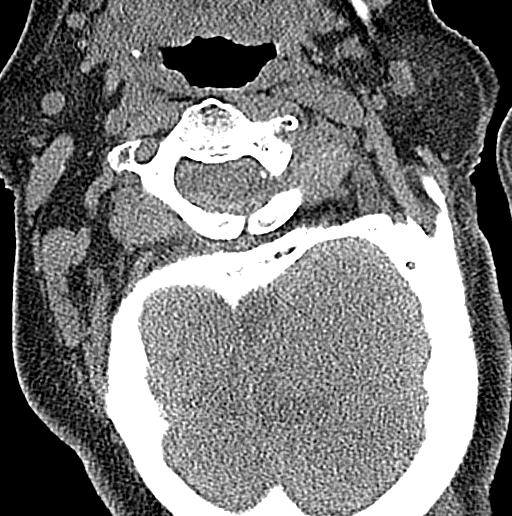
[im 62/87  bone]
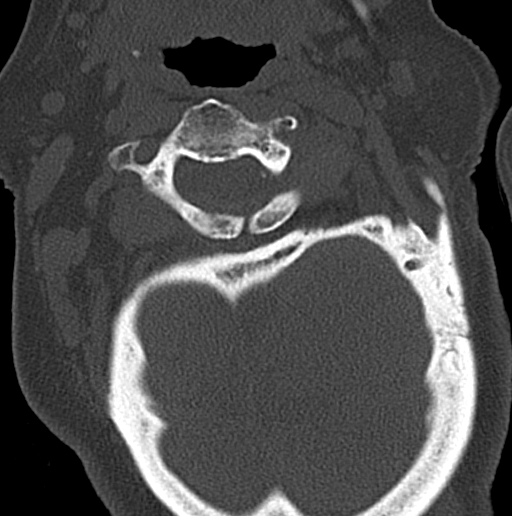
[im 74/87  bone]
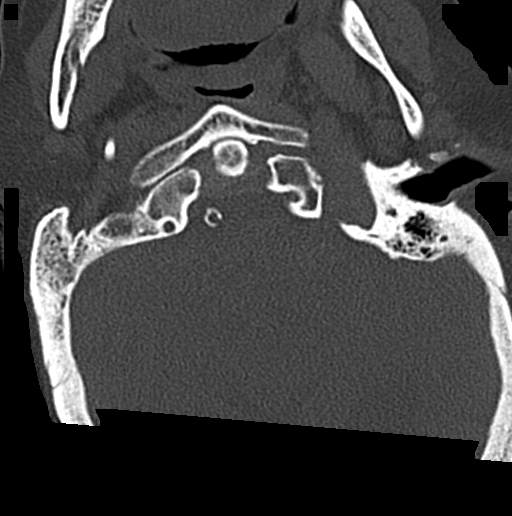

[14 of 33 positions shown; findings below may reference images not displayed]

FINDINGS: CT HEAD FINDINGS

Brain: Moderate cerebral and mild cerebellar atrophy. Ex vacuo
dilatation of the ventricular system. Patchy and confluent areas of
decreased attenuation are noted throughout the deep and
periventricular white matter of the cerebral hemispheres
bilaterally, compatible with chronic microvascular ischemic disease.
No evidence of acute infarction, hemorrhage, hydrocephalus,
extra-axial collection or mass lesion/mass effect.

Vascular: No hyperdense vessel or unexpected calcification.

Skull: Normal. Negative for fracture or focal lesion.

Sinuses/Orbits: No acute finding.

Other: In the left occipital scalp there is a high attenuation fluid
collection measuring 5.2 x 1.2 x 1.3 cm, compatible with a scalp
contusion/hematoma.

CT CERVICAL SPINE FINDINGS

Alignment: Normal.

Skull base and vertebrae: No acute fracture. No primary bone lesion
or focal pathologic process. Incomplete fusion of the posterior
elements of C1 (normal anatomical variant) incidentally noted.

Soft tissues and spinal canal: No prevertebral fluid or swelling. No
visible canal hematoma.

Disc levels: Multilevel degenerative disc disease, most pronounced
at C4-C5. Moderate multilevel facet arthropathy.

Upper chest: Negative.

Other: None.
IMPRESSION: 1. Large left occipital scalp contusion/hematoma. No underlying
displaced skull fracture or signs of significant acute traumatic
injury to the brain.
2. No evidence of significant acute traumatic injury to the cervical
spine.
3. Moderate cerebral and mild cerebellar atrophy with ex vacuo
dilatation of the ventricular system and chronic microvascular
ischemic changes in the cerebral white matter, as above.
4. Multilevel degenerative disc disease and cervical spondylosis, as
above.

## 2022-01-25 IMAGING — CT CT HEAD W/O CM
4 series · 16 of 47 positions shown, 18 images · non-contrast
Comparison: No priors.

CLINICAL DATA: 75-year-old female with history of trauma from a
fall. Trauma to the head and neck.

EXAM:
CT HEAD WITHOUT CONTRAST
CT CERVICAL SPINE WITHOUT CONTRAST
TECHNIQUE: Multidetector CT imaging of the head and cervical spine was
performed following the standard protocol without intravenous
contrast. Multiplanar CT image reconstructions of the cervical spine
were also generated.

[Series 2: head bone · axial · 0.45mm/px · z∈[-155,-123]mm · 3 of 83 slices shown]
[im 9/83  bone]
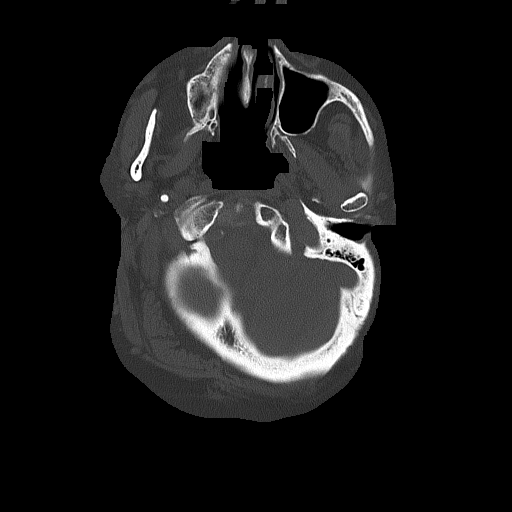
[im 17/83  bone]
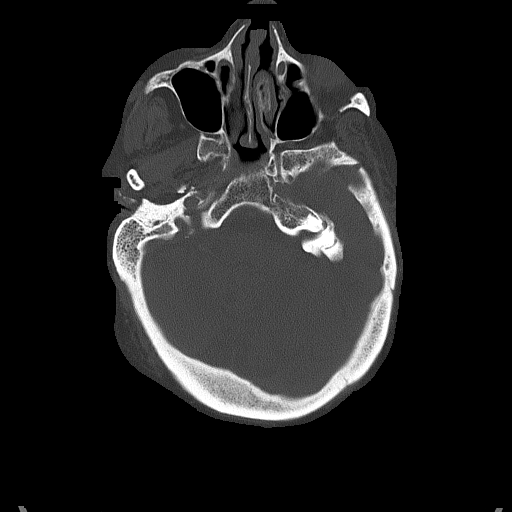
[im 25/83  bone]
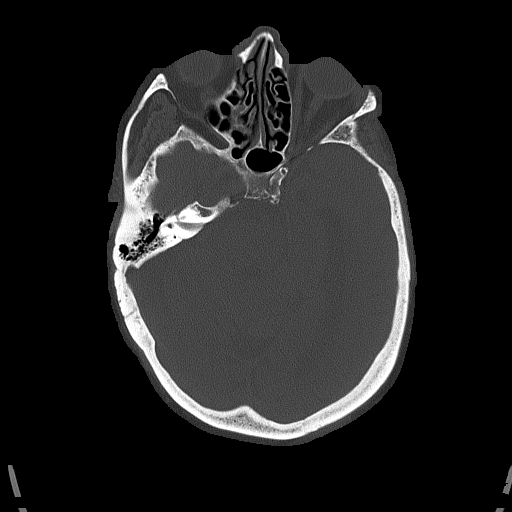

[Series 3: head wo · axial · 0.45mm/px · z∈[-151,-31]mm · 7 of 34 slices shown, 9 images]
[im 5/34  brain]
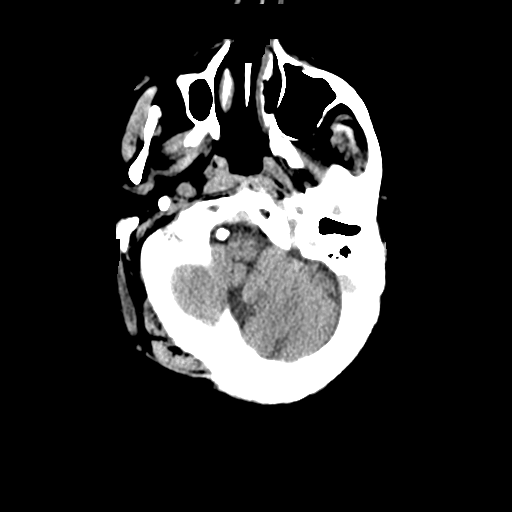
[im 5/34  bone]
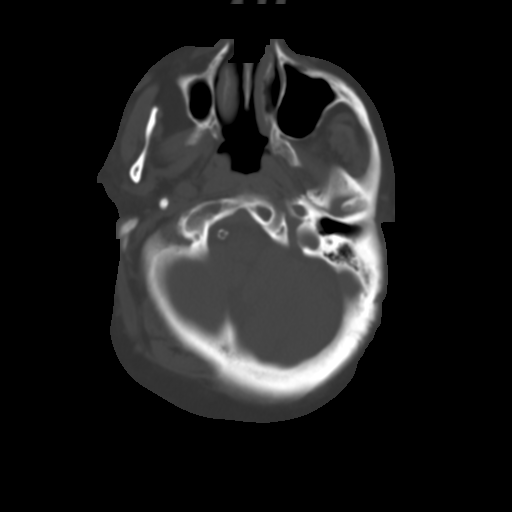
[im 9/34  brain]
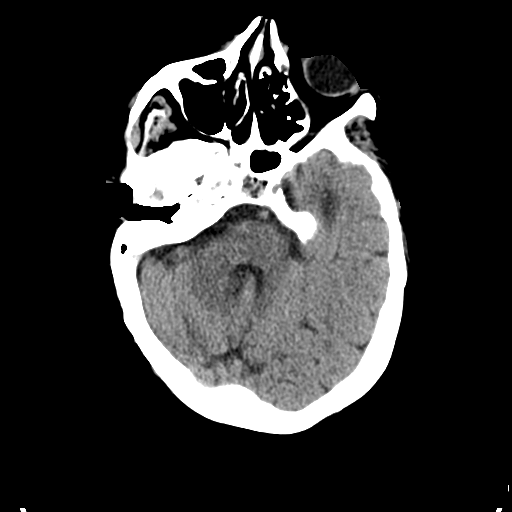
[im 13/34  brain]
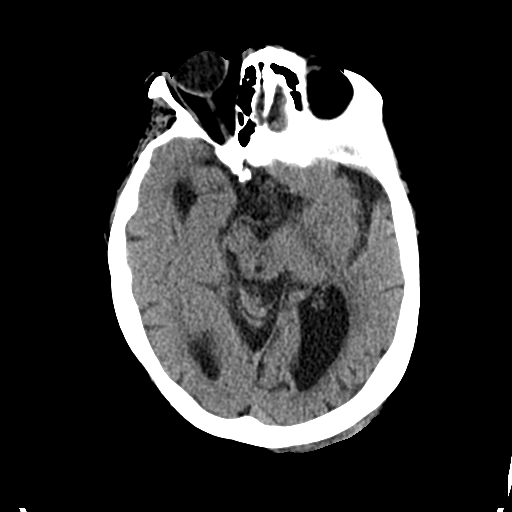
[im 17/34  brain]
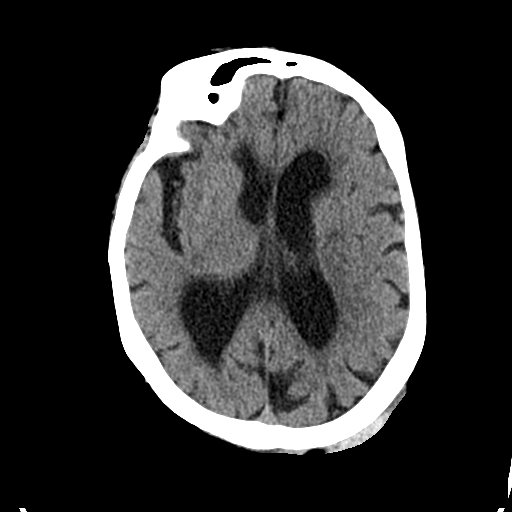
[im 21/34  brain]
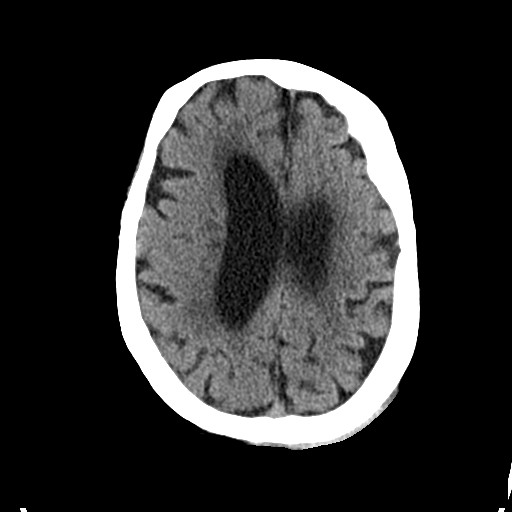
[im 21/34  bone]
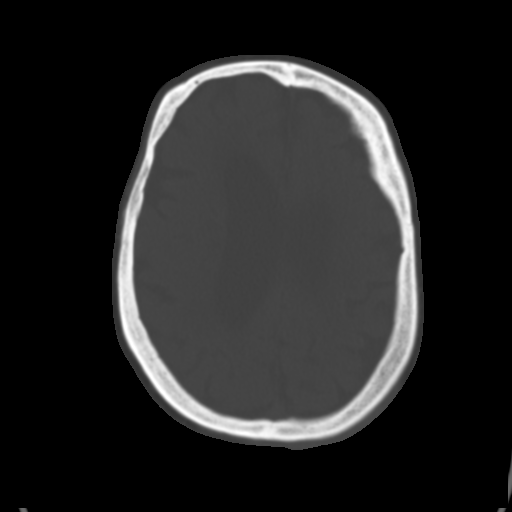
[im 25/34  brain]
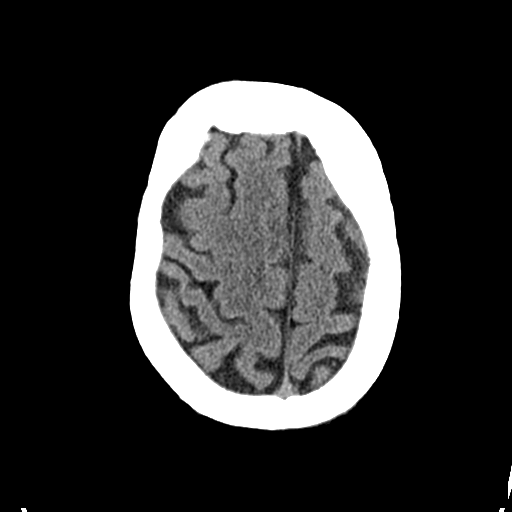
[im 29/34  brain]
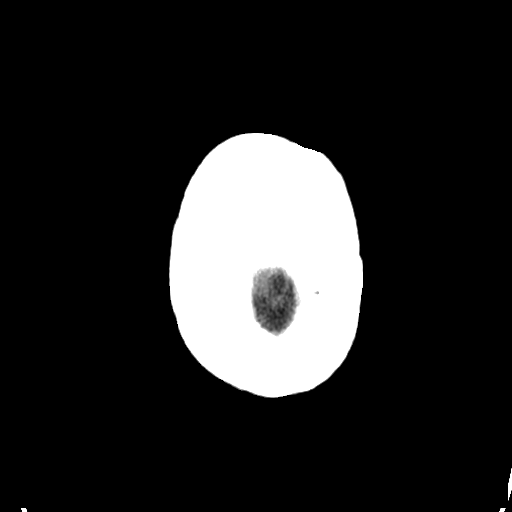

[Series 4: coronal soft tissue · coronal · 0.29mm/px · 3 of 68 slices shown]
[im 23/68  brain]
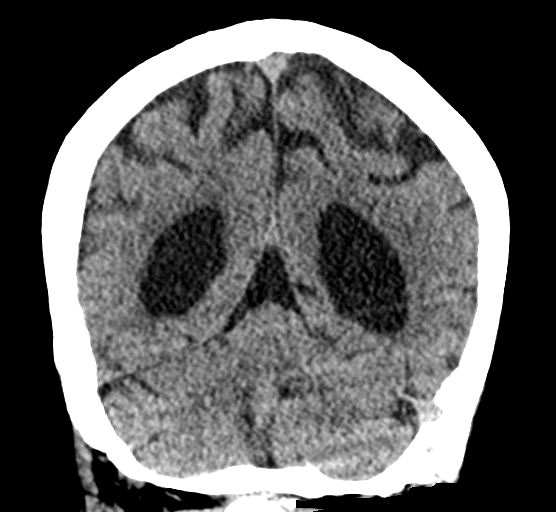
[im 30/68  brain]
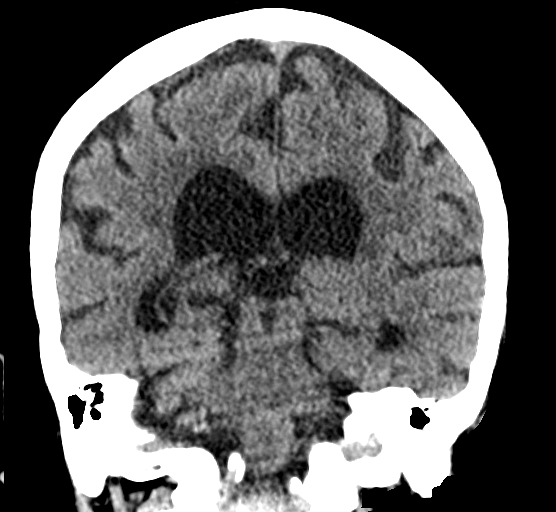
[im 38/68  brain]
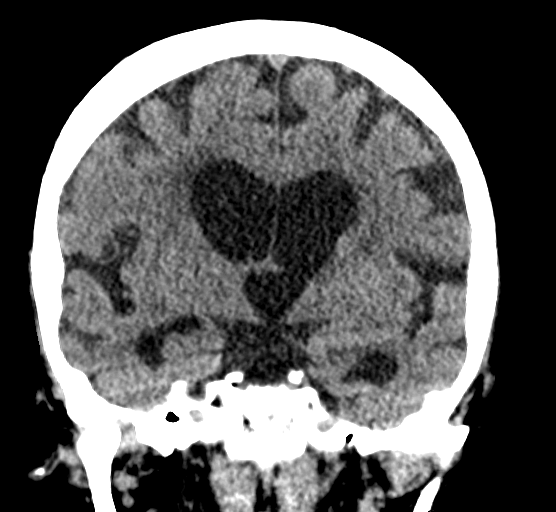

[Series 5: sagittal soft tissue · sagittal · 0.33mm/px · 3 of 55 slices shown]
[im 21/55  brain]
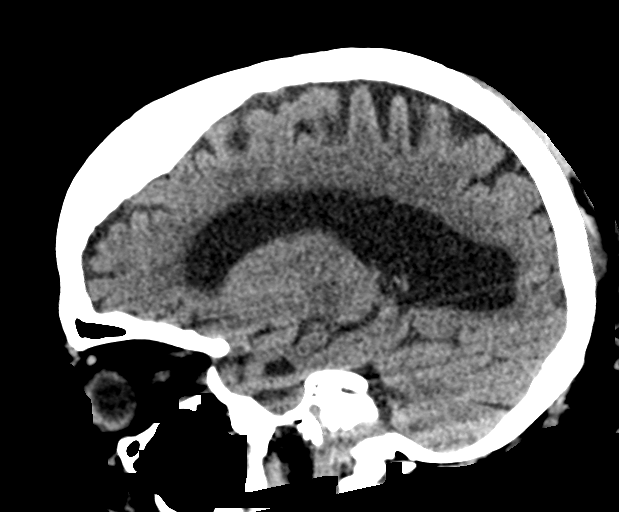
[im 27/55  brain]
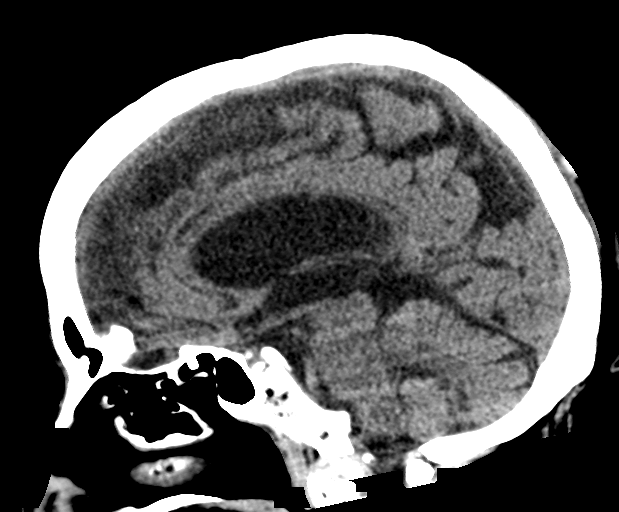
[im 33/55  brain]
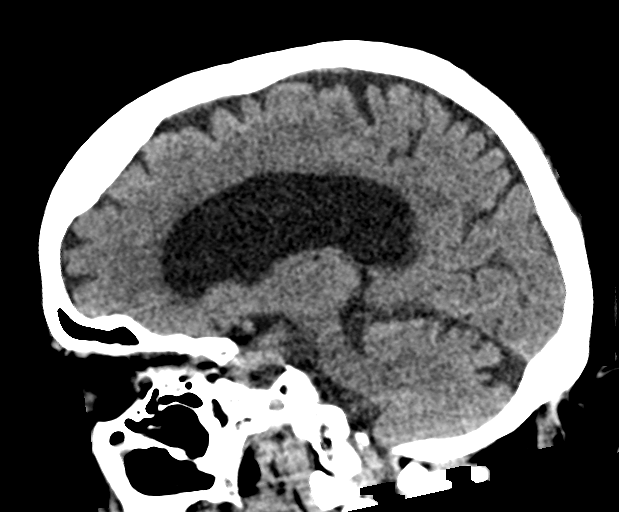

[16 of 47 positions shown; findings below may reference images not displayed]

FINDINGS: CT HEAD FINDINGS

Brain: Moderate cerebral and mild cerebellar atrophy. Ex vacuo
dilatation of the ventricular system. Patchy and confluent areas of
decreased attenuation are noted throughout the deep and
periventricular white matter of the cerebral hemispheres
bilaterally, compatible with chronic microvascular ischemic disease.
No evidence of acute infarction, hemorrhage, hydrocephalus,
extra-axial collection or mass lesion/mass effect.

Vascular: No hyperdense vessel or unexpected calcification.

Skull: Normal. Negative for fracture or focal lesion.

Sinuses/Orbits: No acute finding.

Other: In the left occipital scalp there is a high attenuation fluid
collection measuring 5.2 x 1.2 x 1.3 cm, compatible with a scalp
contusion/hematoma.

CT CERVICAL SPINE FINDINGS

Alignment: Normal.

Skull base and vertebrae: No acute fracture. No primary bone lesion
or focal pathologic process. Incomplete fusion of the posterior
elements of C1 (normal anatomical variant) incidentally noted.

Soft tissues and spinal canal: No prevertebral fluid or swelling. No
visible canal hematoma.

Disc levels: Multilevel degenerative disc disease, most pronounced
at C4-C5. Moderate multilevel facet arthropathy.

Upper chest: Negative.

Other: None.
IMPRESSION: 1. Large left occipital scalp contusion/hematoma. No underlying
displaced skull fracture or signs of significant acute traumatic
injury to the brain.
2. No evidence of significant acute traumatic injury to the cervical
spine.
3. Moderate cerebral and mild cerebellar atrophy with ex vacuo
dilatation of the ventricular system and chronic microvascular
ischemic changes in the cerebral white matter, as above.
4. Multilevel degenerative disc disease and cervical spondylosis, as
above.

## 2022-01-25 IMAGING — DX DG SHOULDER 2+V*L*
3 series · 3 of 3 positions shown · non-contrast
Comparison: Left humerus radiograph 01/08/2021.

CLINICAL DATA: 75-year-old female with history of trauma from a
fall. Left shoulder pain.

EXAM:
LEFT SHOULDER - 2+ VIEW

[shoulder axial]
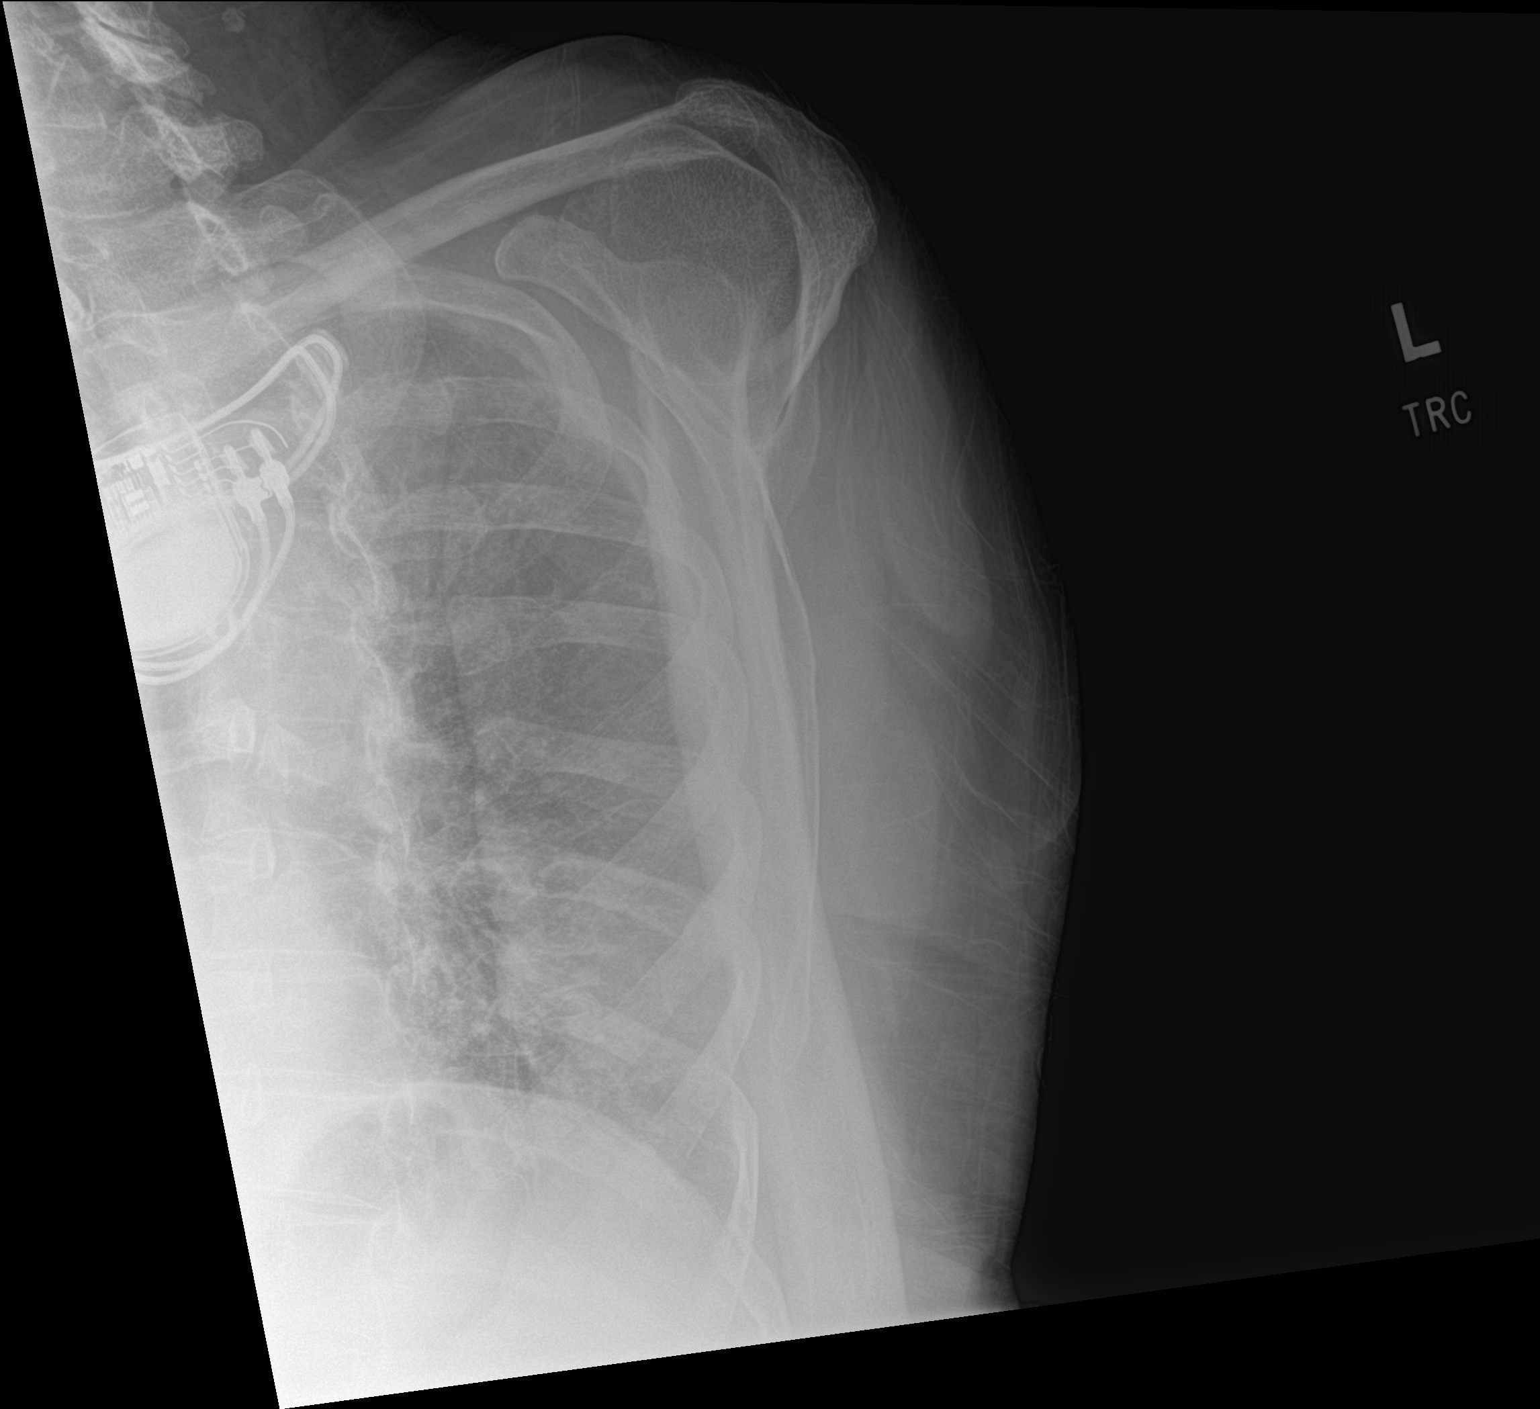

[shoulder ap]
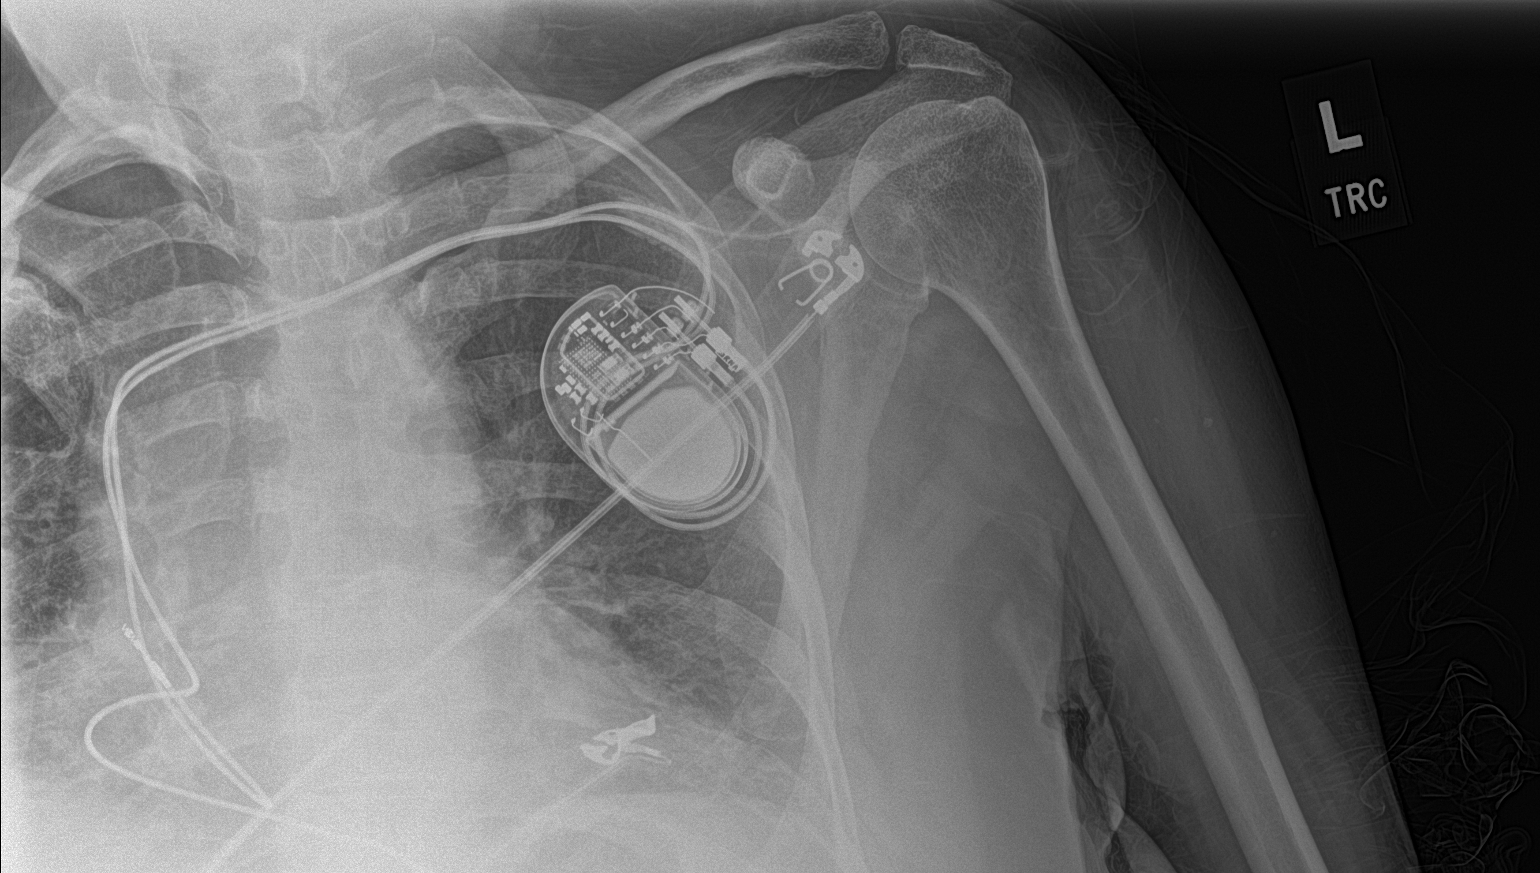

[shoulder obl]
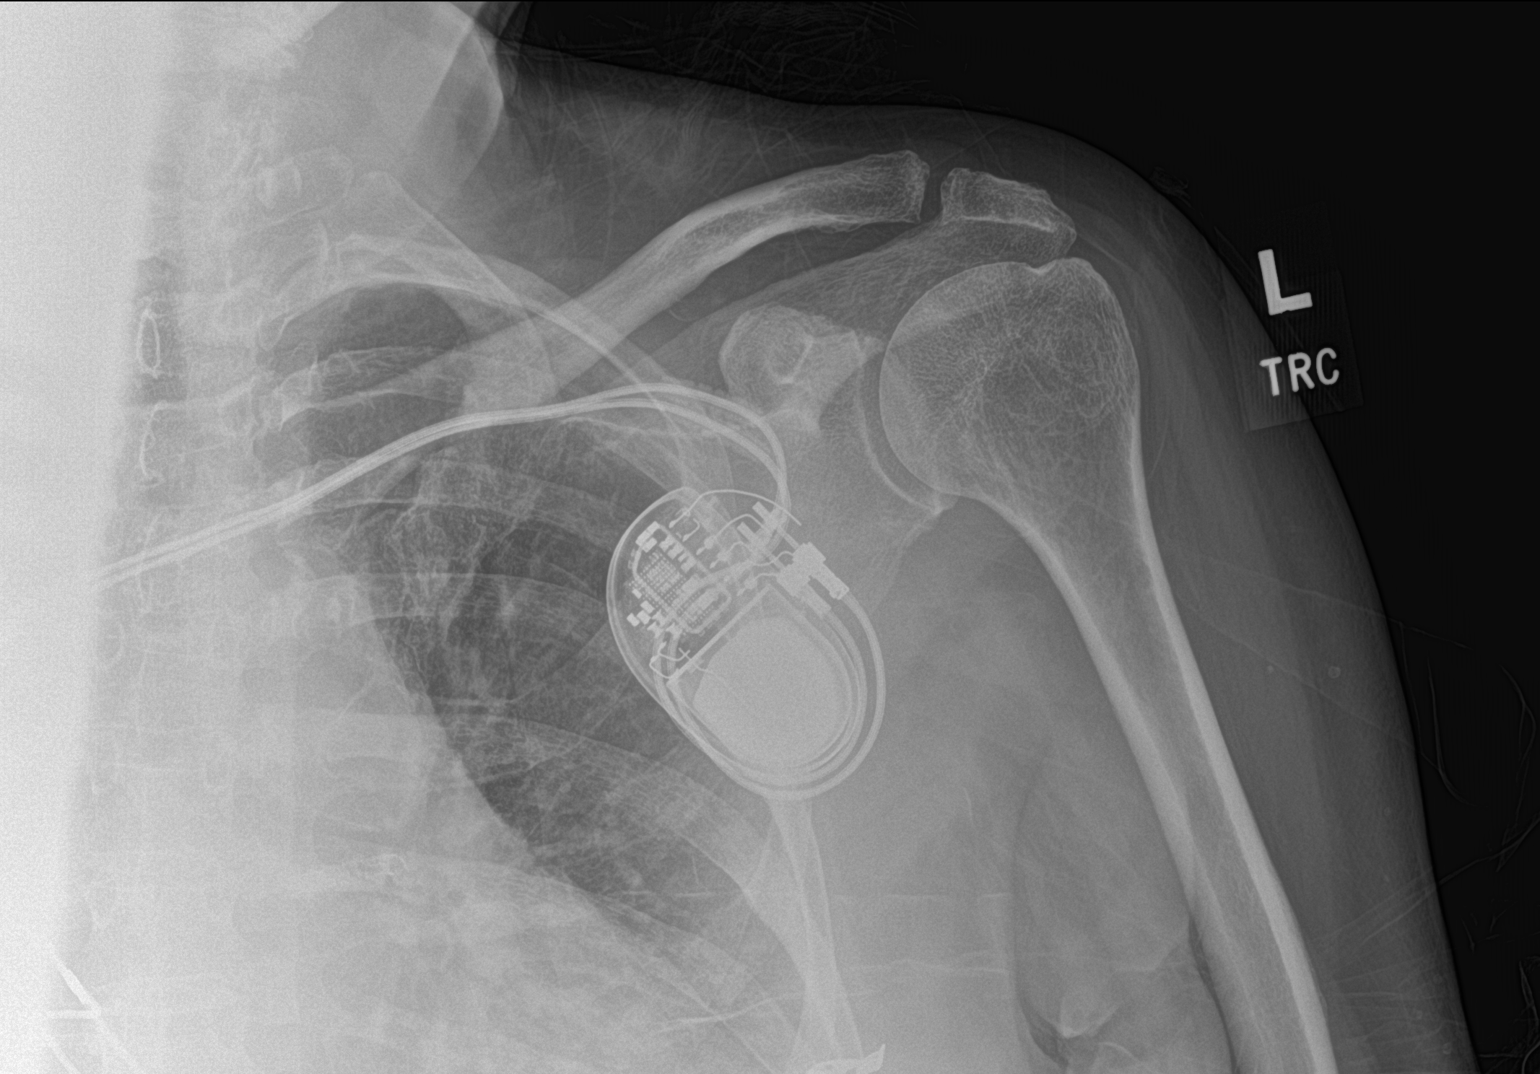

[3 of 3 positions shown; findings below may reference images not displayed]

FINDINGS: There is no evidence of fracture or dislocation. There is no
evidence of arthropathy or other focal bone abnormality. Left-sided
pacemaker incompletely imaged.
IMPRESSION: Negative.

## 2022-02-10 IMAGING — CT CT CERVICAL SPINE W/O CM
3 of 4 series · 14 of 34 positions shown, 17 images · non-contrast
Comparison: 02/16/2021 .

CLINICAL DATA: Trauma, fall

EXAM:
CT CERVICAL SPINE WITHOUT CONTRAST
TECHNIQUE: Multidetector CT imaging of the cervical spine was performed without
intravenous contrast. Multiplanar CT image reconstructions were also
generated.

[Series 4: sagittal bone · sagittal · 0.45mm/px · 5 of 156 slices shown, 6 images]
[im 52/156  bone]
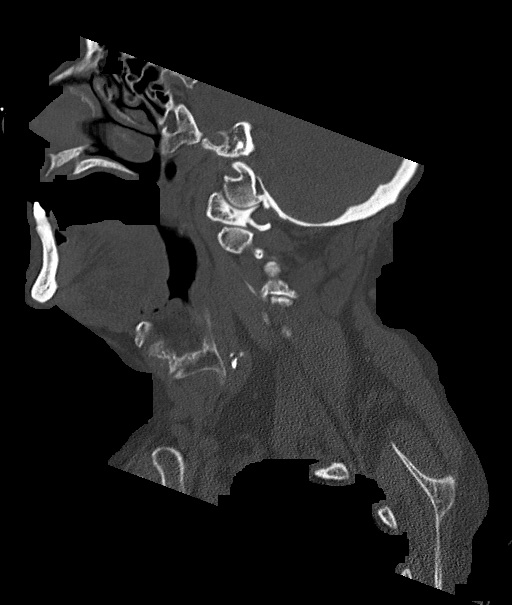
[im 65/156  bone]
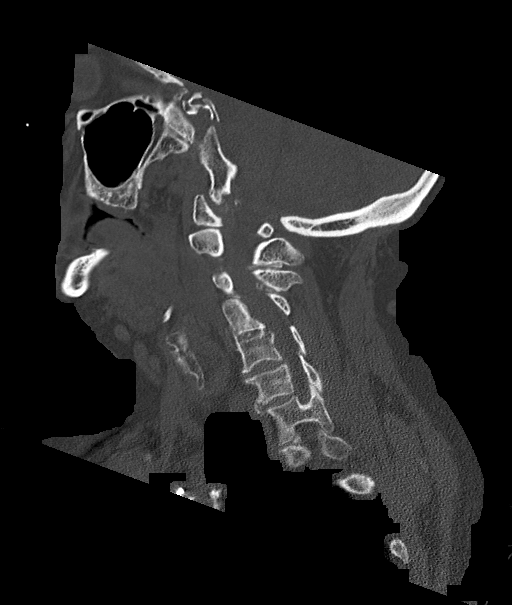
[im 78/156  soft-tissue]
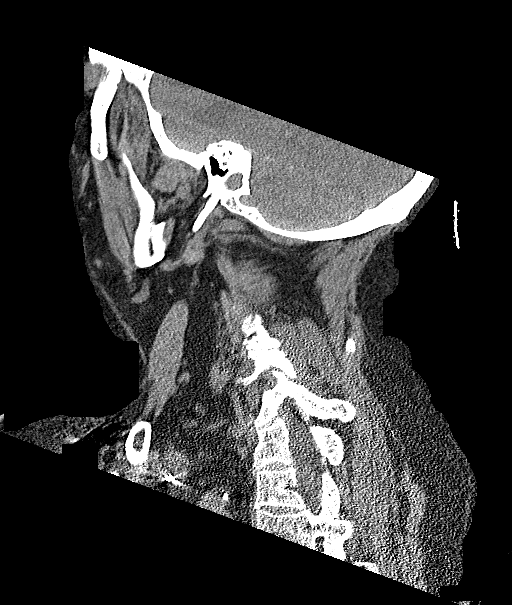
[im 78/156  bone]
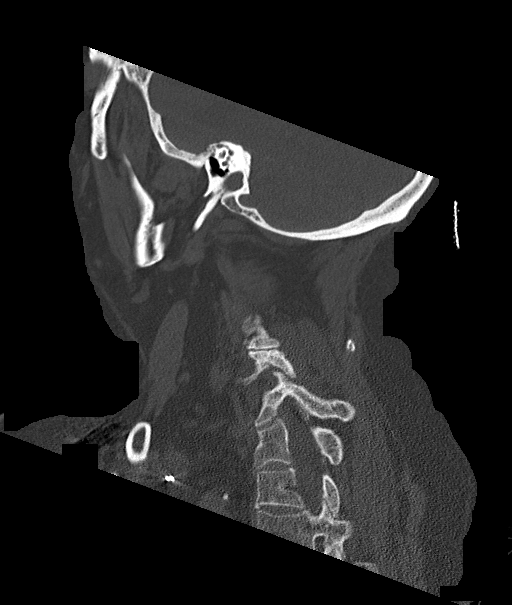
[im 91/156  bone]
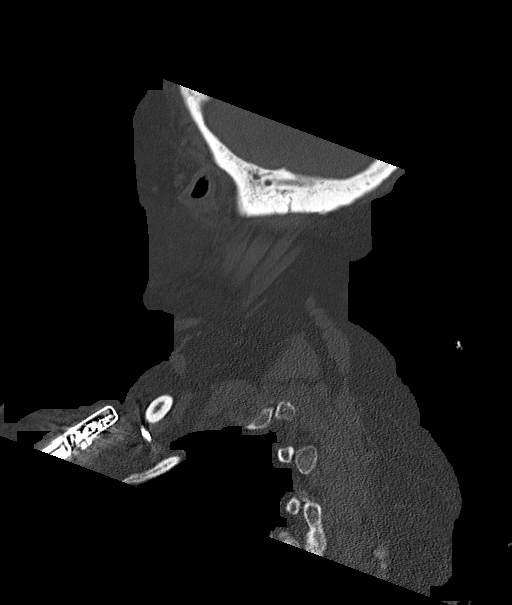
[im 104/156  bone]
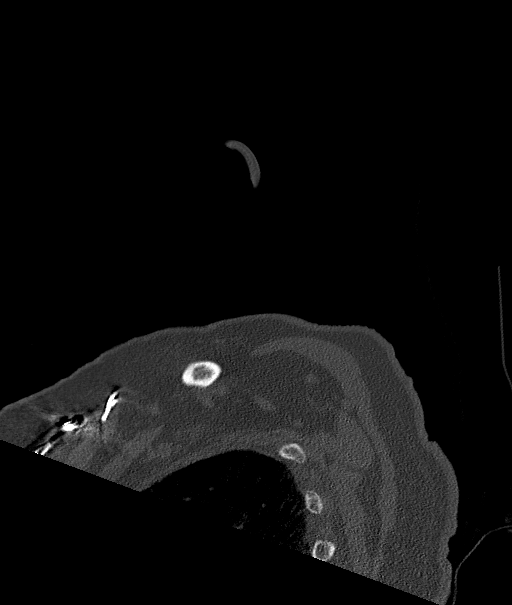

[Series 5: coronal bone · coronal · 0.41mm/px · 3 of 132 slices shown]
[im 27/132  bone]
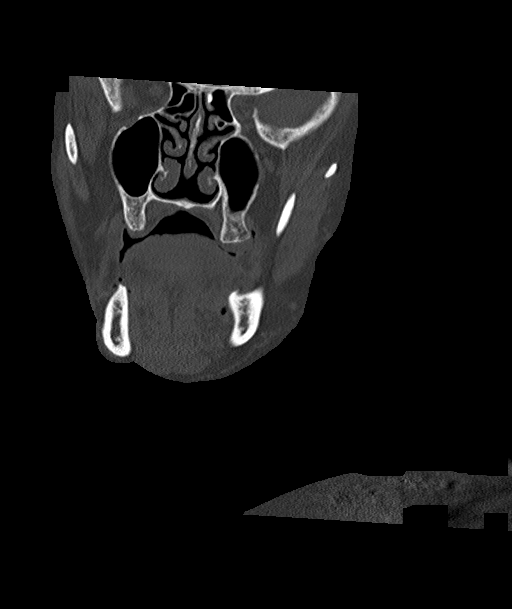
[im 53/132  bone]
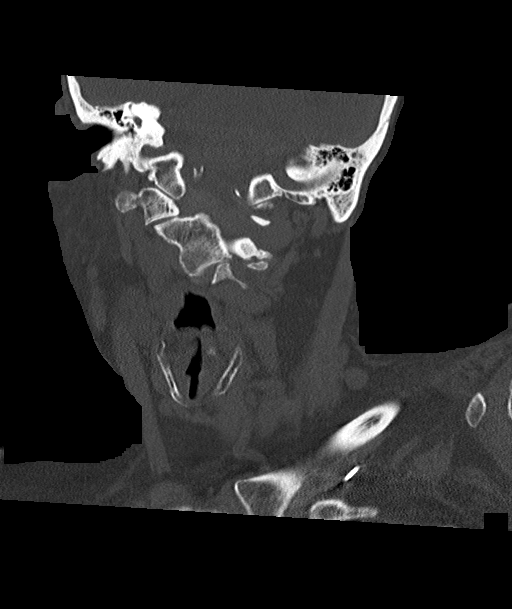
[im 79/132  bone]
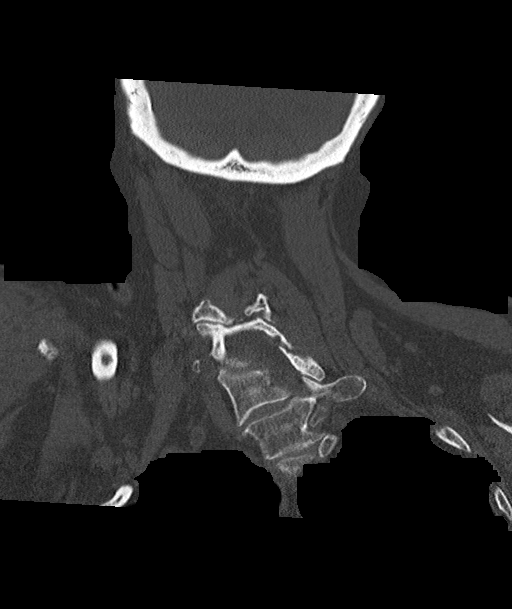

[Series 6: orthogonal bone · axial · 0.45mm/px · z∈[-321,-173]mm · 6 of 115 slices shown, 8 images]
[im 17/115  soft-tissue]
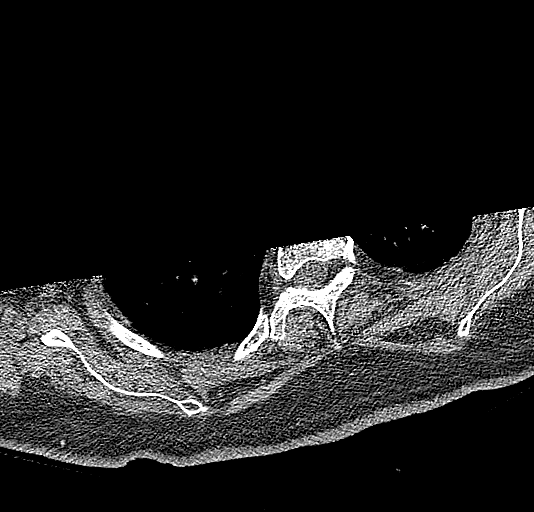
[im 17/115  bone]
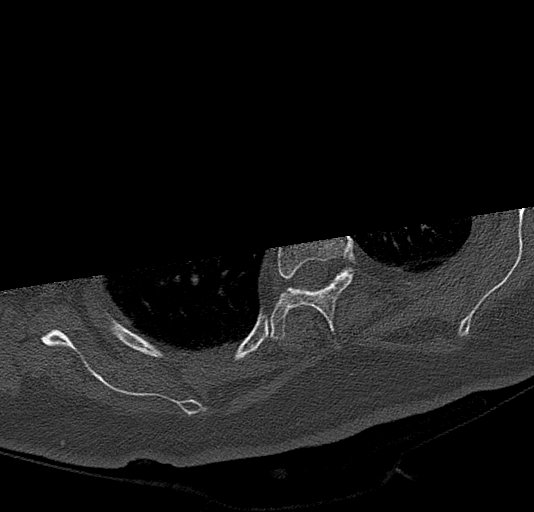
[im 33/115  bone]
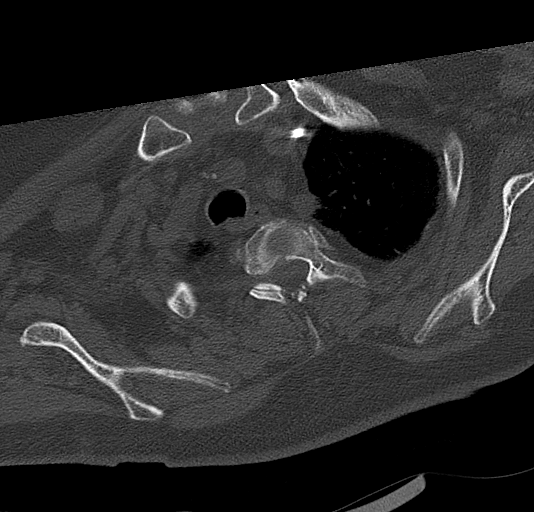
[im 49/115  bone]
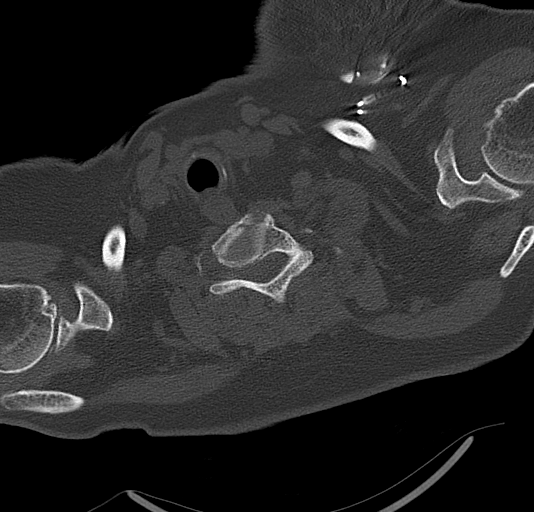
[im 66/115  bone]
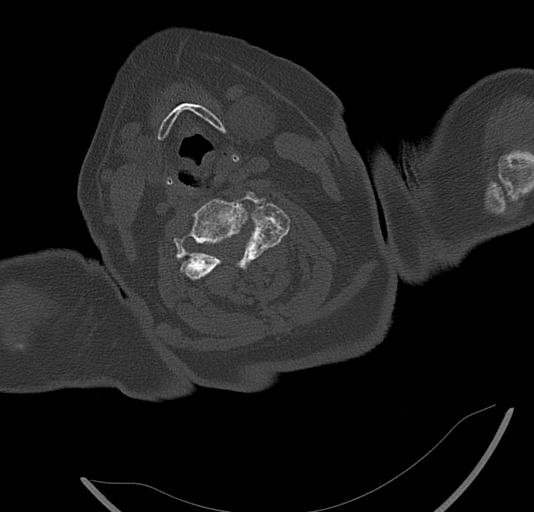
[im 82/115  soft-tissue]
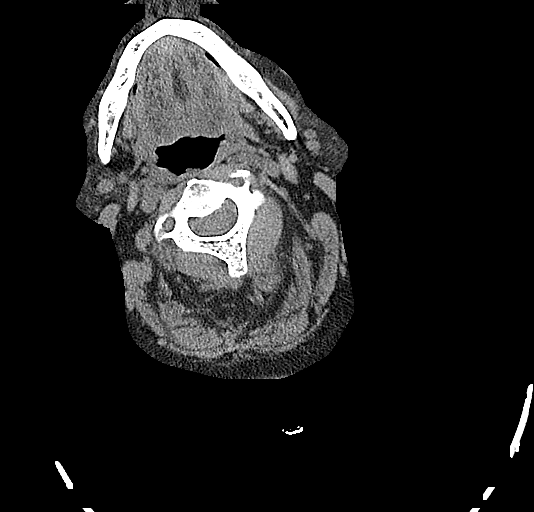
[im 82/115  bone]
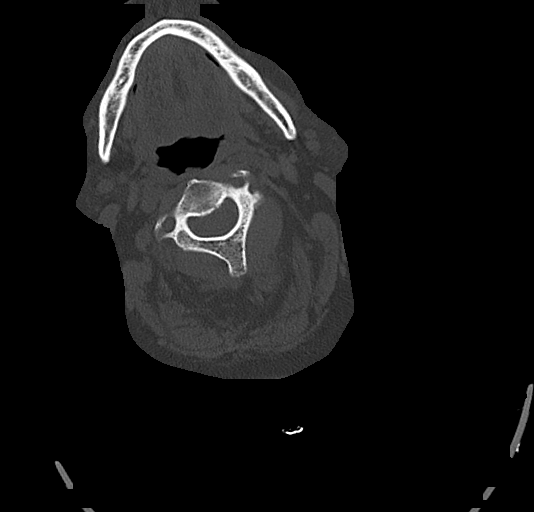
[im 98/115  bone]
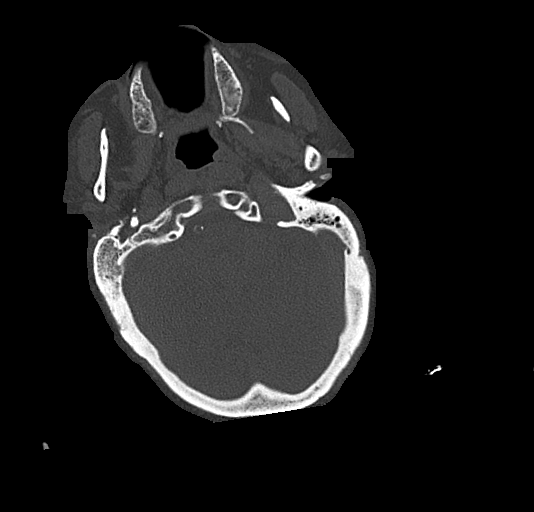

[14 of 34 positions shown; findings below may reference images not displayed]

FINDINGS: Alignment: Alignment of posterior margins of vertebral bodies is
unremarkable. Dextroscoliosis is seen.

Skull base and vertebrae: No recent fracture is seen. Degenerative
changes are noted in the cervical spine with bony spurs and facet
hypertrophy.

Soft tissues and spinal canal: Prevertebral soft tissues are
unremarkable. There is spinal stenosis at C3-C4, C4-C5 and C5-C6
levels.

Disc levels: There is encroachment of neural foramina from C2 to C7
levels.

Upper chest: Unremarkable.

Other: Pacemaker battery is seen in the left infraclavicular region.
Degenerative changes and possible postsurgical changes are noted in
the right shoulder.
IMPRESSION: No recent fracture is seen in the cervical spine. Cervical
spondylosis with spinal stenosis and encroachment of neural foramina
at multiple levels.

## 2022-02-10 IMAGING — CT CT HEAD W/O CM
4 series · 17 of 47 positions shown, 19 images · non-contrast
Comparison: 02/16/2021

CLINICAL DATA: Trauma, fall

EXAM:
CT HEAD WITHOUT CONTRAST
TECHNIQUE: Contiguous axial images were obtained from the base of the skull
through the vertex without intravenous contrast.

[Series 2: head wo · axial · 0.42mm/px · z∈[-119,+1]mm · 7 of 34 slices shown, 9 images]
[im 5/34  brain]
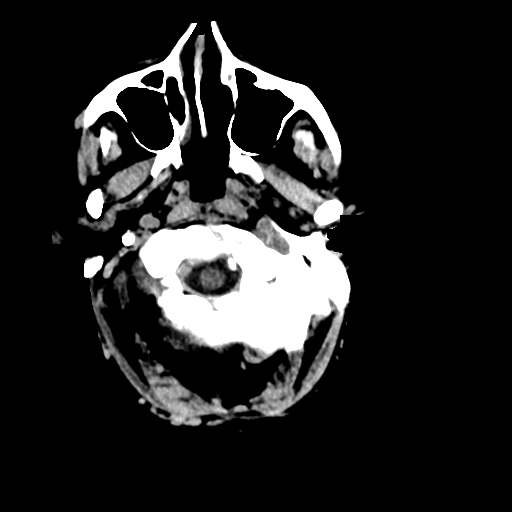
[im 5/34  bone]
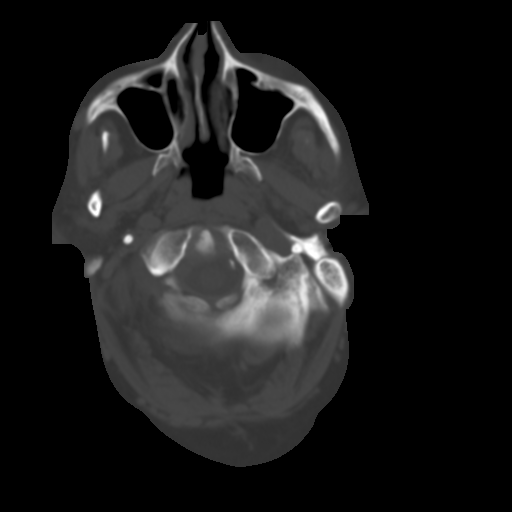
[im 9/34  brain]
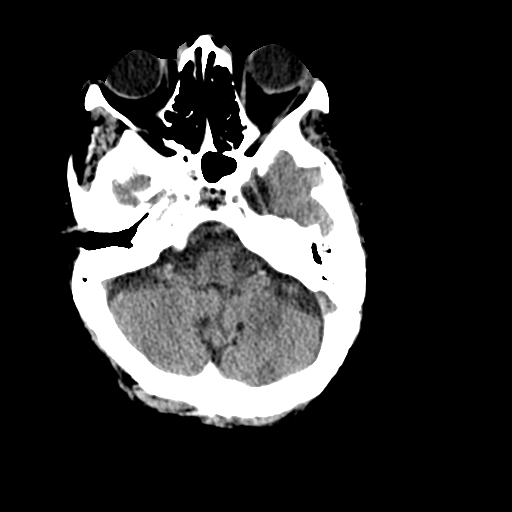
[im 13/34  brain]
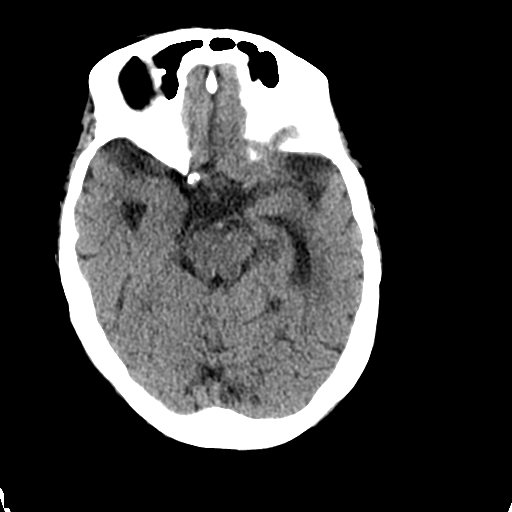
[im 17/34  brain]
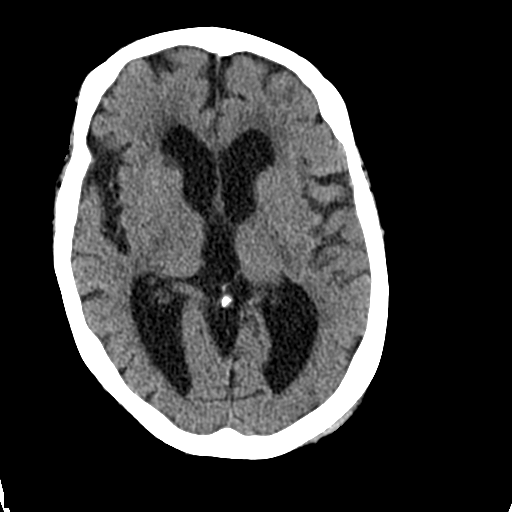
[im 21/34  brain]
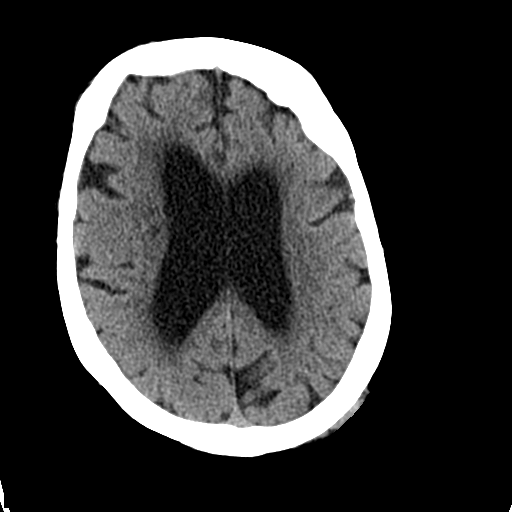
[im 21/34  bone]
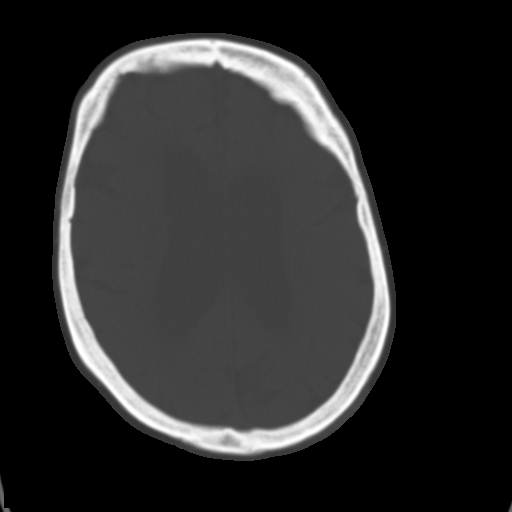
[im 25/34  brain]
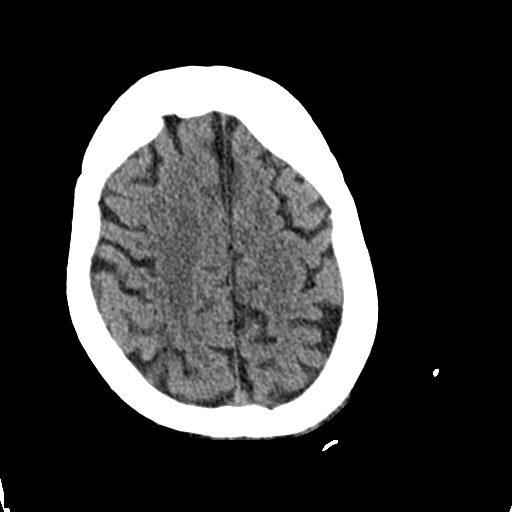
[im 29/34  brain]
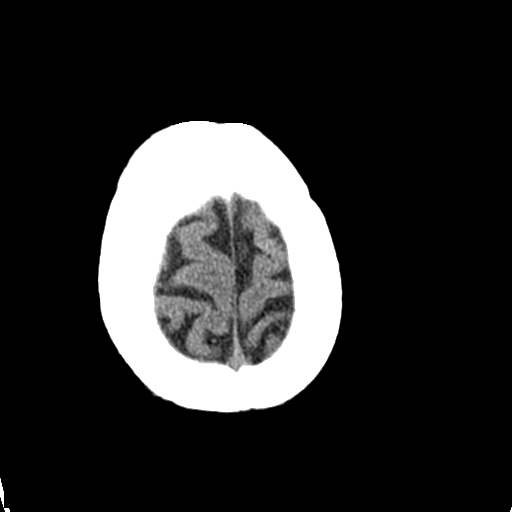

[Series 3: head bone · axial · 0.42mm/px · z∈[-123,-65]mm · 4 of 85 slices shown]
[im 9/85  bone]
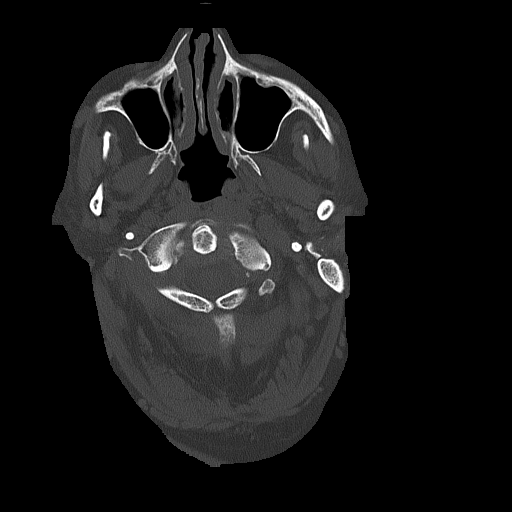
[im 17/85  bone]
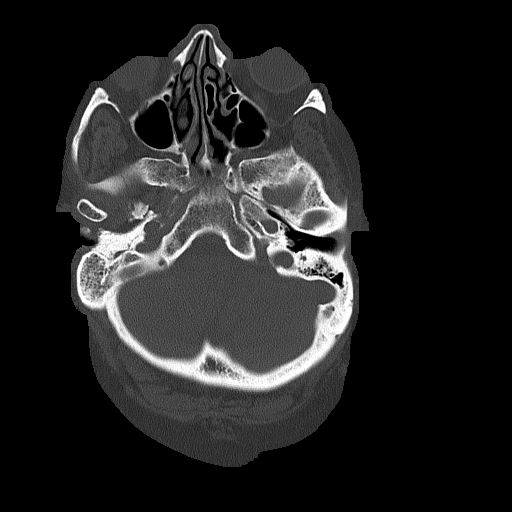
[im 26/85  bone]
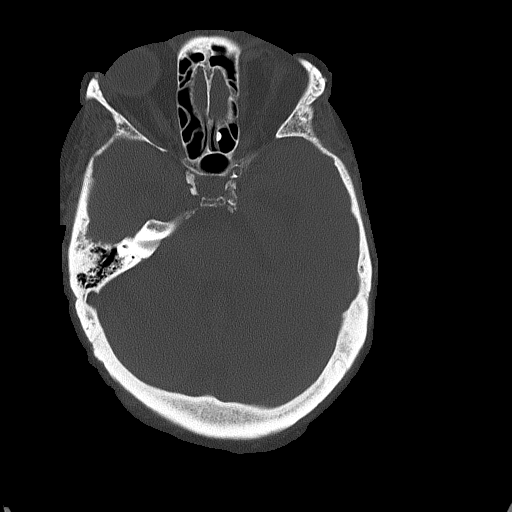
[im 38/85  bone]
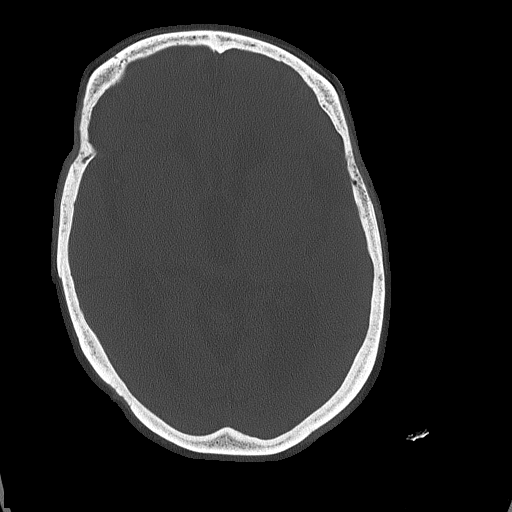

[Series 4: coronal soft tissue · coronal · 0.32mm/px · 3 of 69 slices shown]
[im 23/69  brain]
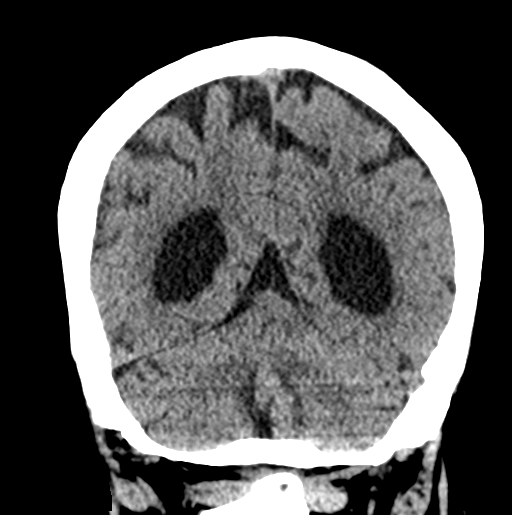
[im 31/69  brain]
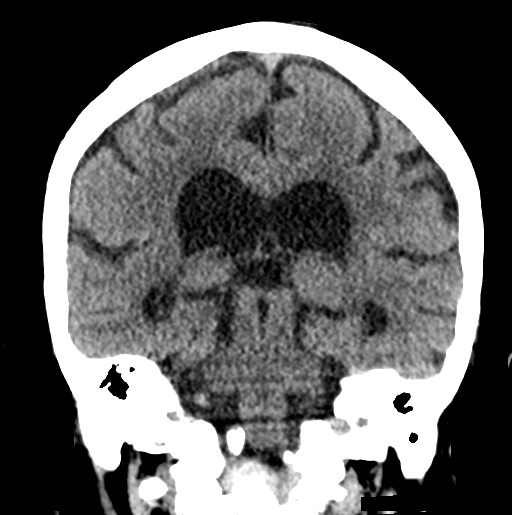
[im 38/69  brain]
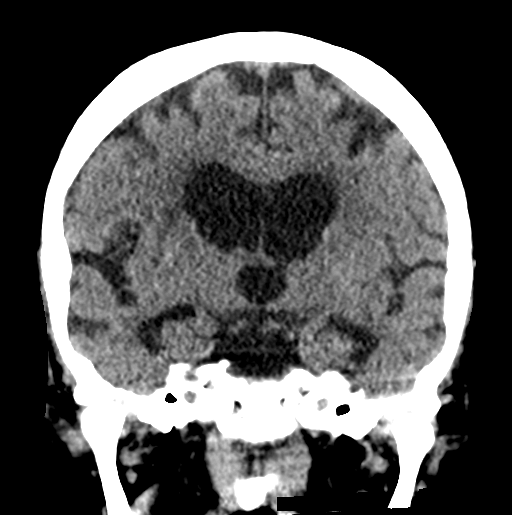

[Series 5: sagittal soft tissue · sagittal · 0.32mm/px · 3 of 55 slices shown]
[im 19/55  brain]
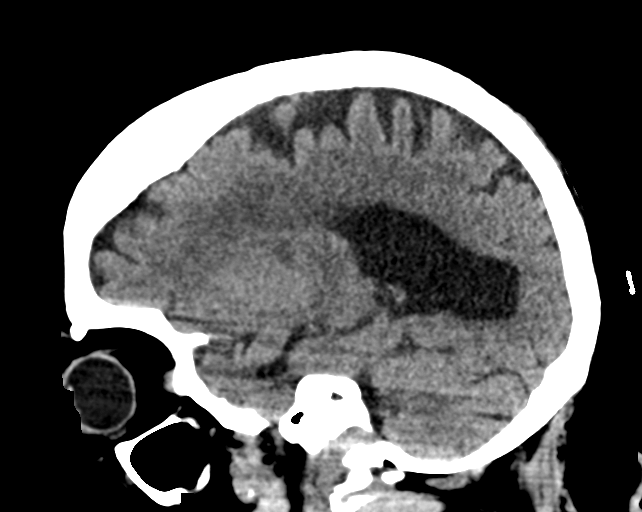
[im 28/55  brain]
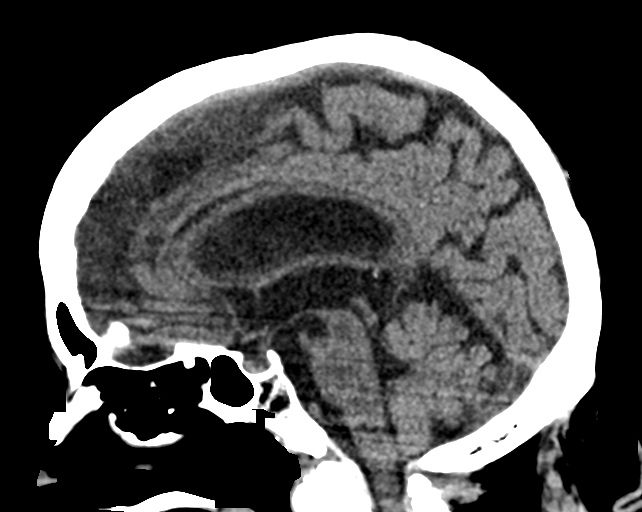
[im 37/55  brain]
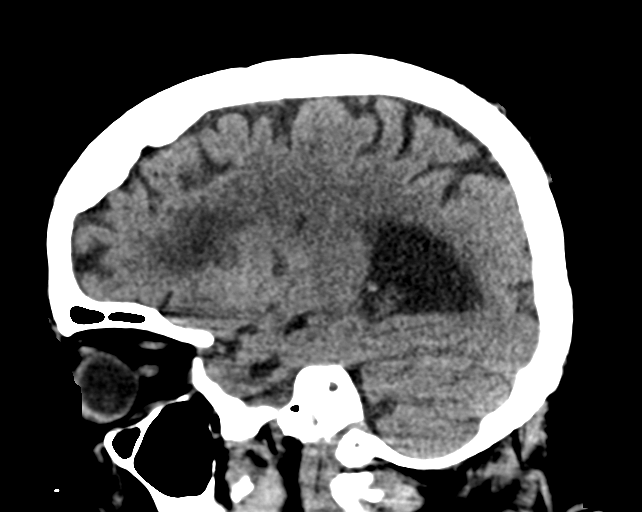

[17 of 47 positions shown; findings below may reference images not displayed]

FINDINGS: Brain: No acute intracranial findings are seen. There are no signs
of bleeding. There is prominence of third and both lateral
ventricles. There is no shift of midline structures. Cortical sulci
are prominent. Small old lacunar infarcts are seen in the basal
ganglia. There is decreased density in the periventricular white
matter.

Vascular: There are scattered arterial calcifications.

Skull: No recent fracture is seen. There is interval decrease in
subcutaneous contusion/hematoma in the left parietal scalp.

Sinuses/Orbits: Unremarkable.

Other: None
IMPRESSION: No acute intracranial findings are seen in noncontrast CT brain.
Atrophy. Small-vessel disease.

## 2022-07-22 IMAGING — CT CT CERVICAL SPINE W/O CM
2 series · 10 of 27 positions shown, 13 images · non-contrast
Comparison: 03/04/2021

CLINICAL DATA: Unwitnessed fall



[Series 3: c spine soft · axial · 0.39mm/px · z∈[-265,-151]mm · 5 of 83 slices shown, 7 images]
[im 13/83  soft-tissue]
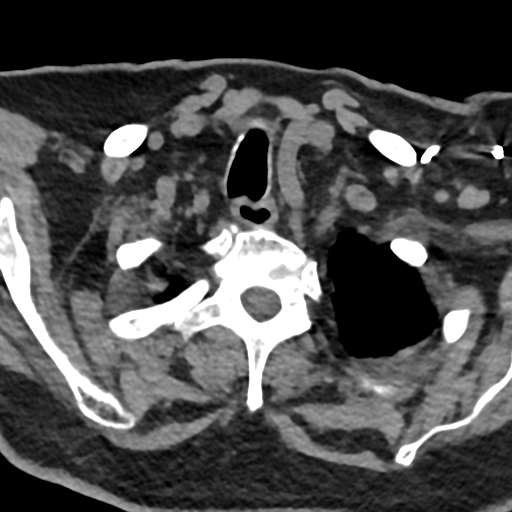
[im 13/83  bone]
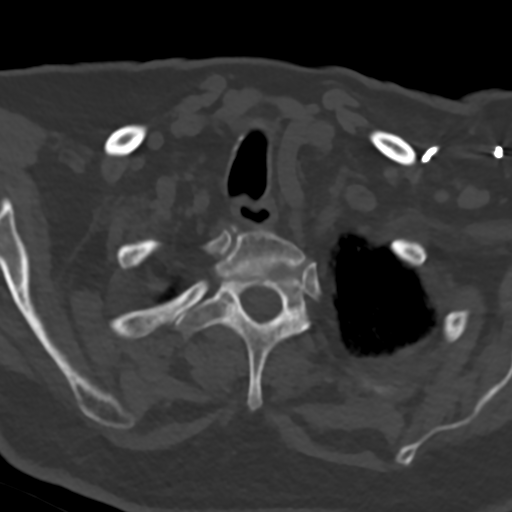
[im 26/83  bone]
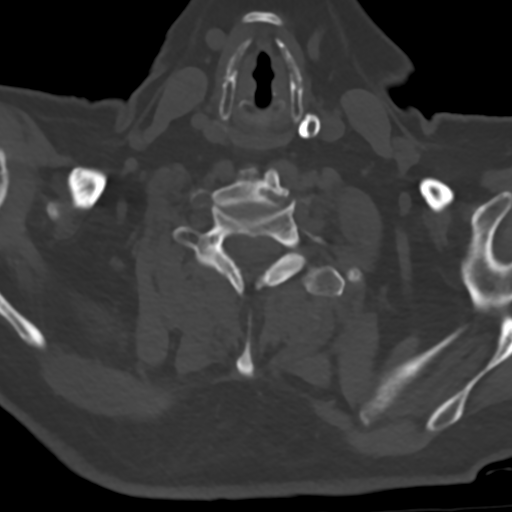
[im 45/83  bone]
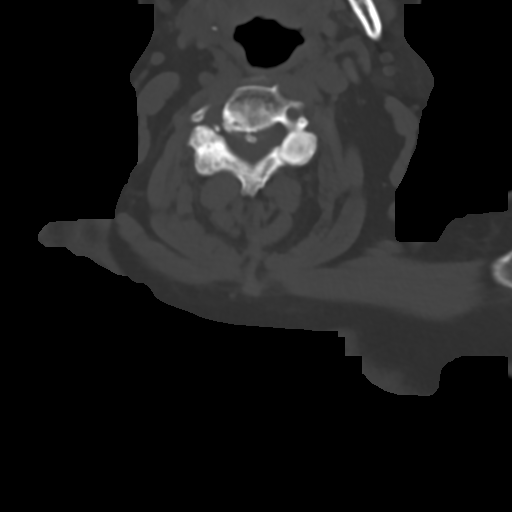
[im 57/83  bone]
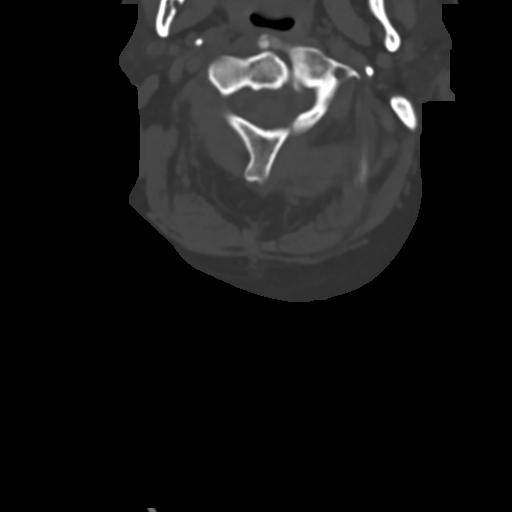
[im 70/83  soft-tissue]
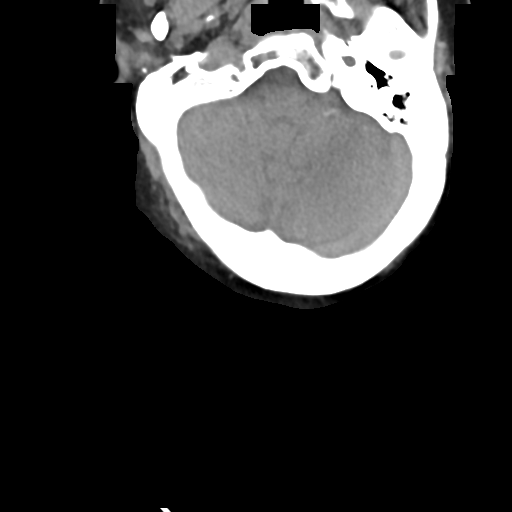
[im 70/83  bone]
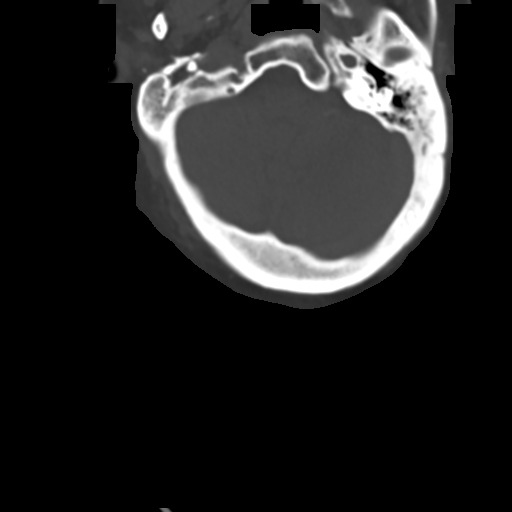

[Series 5: sag bone · sagittal · 0.21mm/px · 5 of 55 slices shown, 6 images]
[im 19/55  bone]
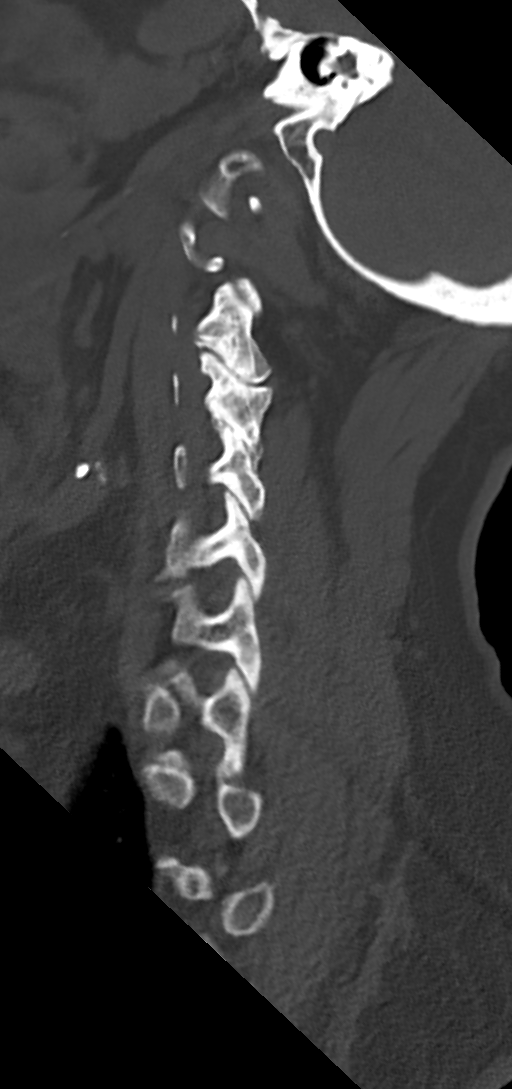
[im 23/55  bone]
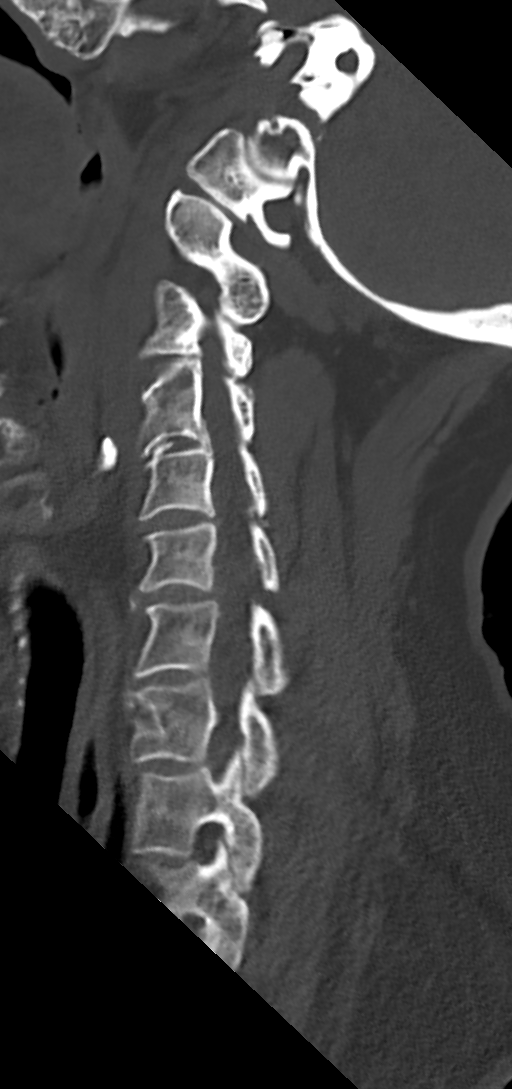
[im 28/55  soft-tissue]
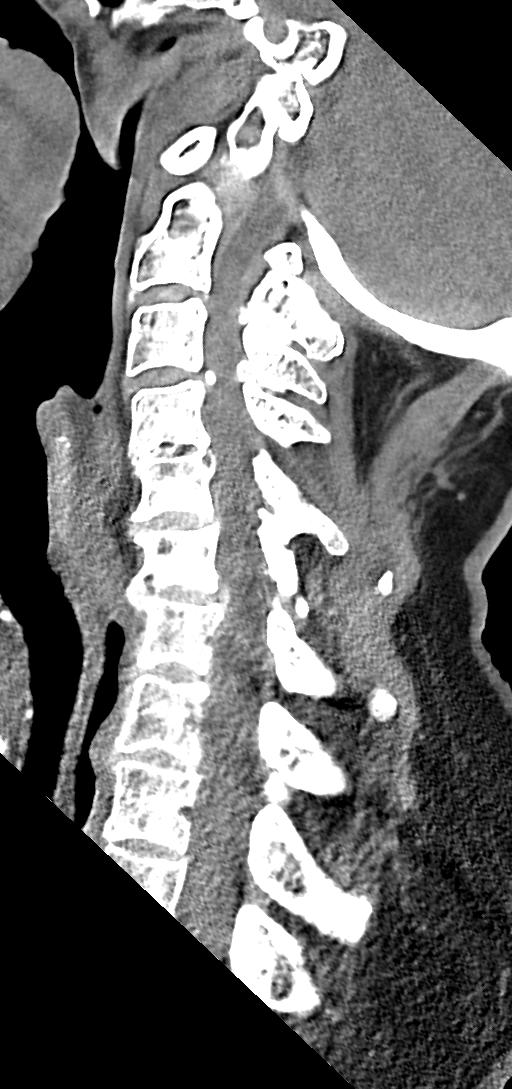
[im 28/55  bone]
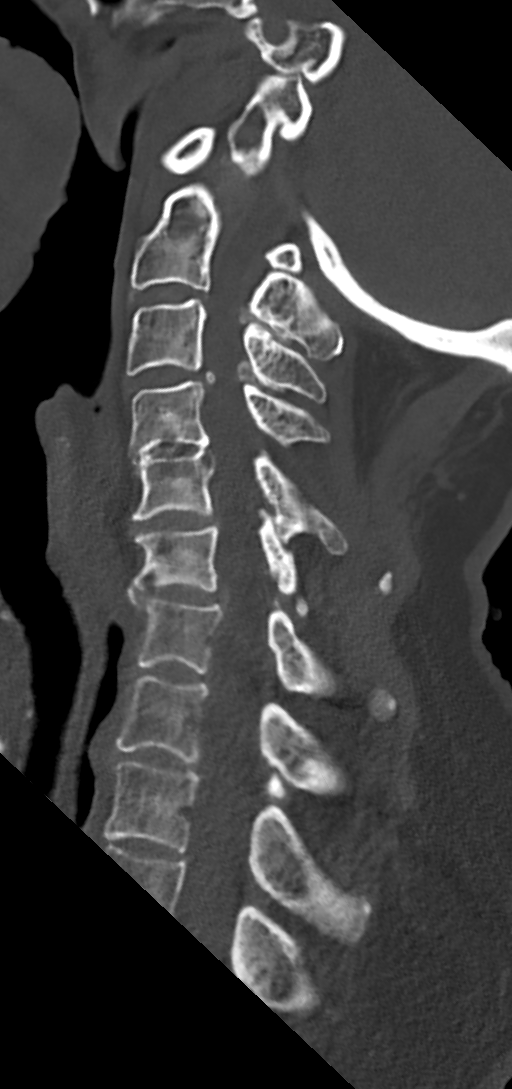
[im 32/55  bone]
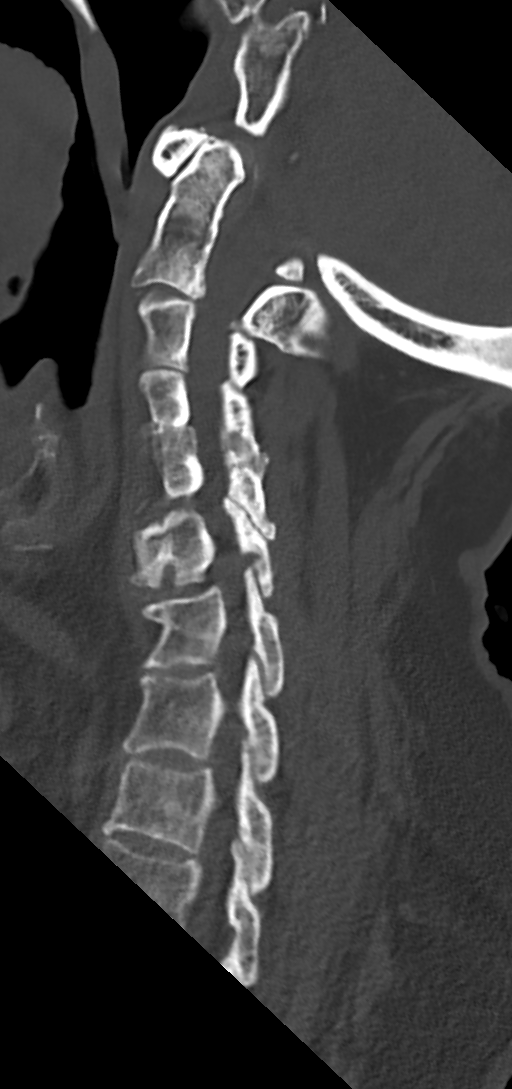
[im 37/55  bone]
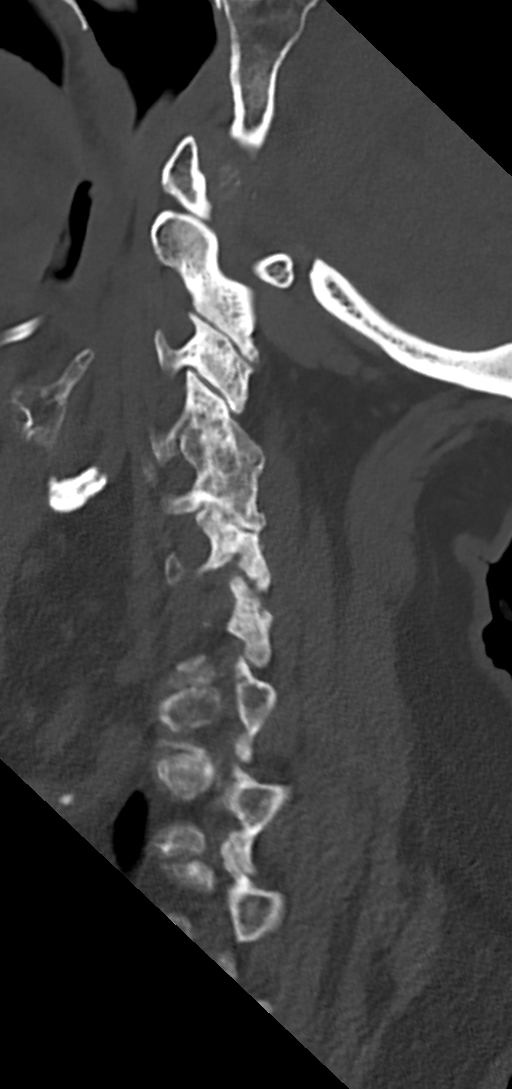

[10 of 27 positions shown; findings below may reference images not displayed]

FINDINGS: CT HEAD FINDINGS

Brain: No evidence of acute infarction, hemorrhage, hydrocephalus,
extra-axial collection or mass lesion/mass effect. Prominence of the
lateral ventricles, unchanged compared to prior examination and
likely related to global cerebral volume loss. Periventricular white
matter hypodensity.

Vascular: No hyperdense vessel or unexpected calcification.

Skull: Normal. Negative for fracture or focal lesion.

Sinuses/Orbits: No acute finding.

Other: Soft tissue contusion of the right forehead (series 3, image
25).

CT CERVICAL SPINE FINDINGS

Alignment: Straightening of the normal cervical lordosis with
unchanged minimal degenerative listhesis of C6 on C7.

Skull base and vertebrae: No acute fracture. Incidental note of
congenital posterior nonunion of C1. No primary bone lesion or focal
pathologic process.

Soft tissues and spinal canal: No prevertebral fluid or swelling. No
visible canal hematoma.

Disc levels: Moderate multilevel disc space height loss and
osteophytosis, worst at C4-C5 and C6-C7.

Upper chest: Negative.

Other: None.
IMPRESSION: 1. No acute intracranial pathology.
2. Soft tissue contusion of the right forehead.
3. No fracture or static subluxation of the cervical spine. Moderate
multilevel disc degenerative disease, worst at C4-C5 and C6-C7.

## 2022-07-22 IMAGING — CT CT HEAD W/O CM
4 series · 16 of 47 positions shown, 18 images · non-contrast
Comparison: 03/04/2021

CLINICAL DATA: Unwitnessed fall



[Series 2: head bone · axial · 0.43mm/px · z∈[-164,-132]mm · 3 of 82 slices shown]
[im 9/82  bone]
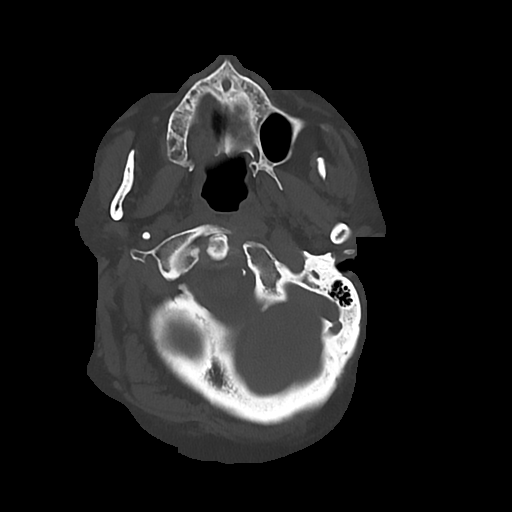
[im 17/82  bone]
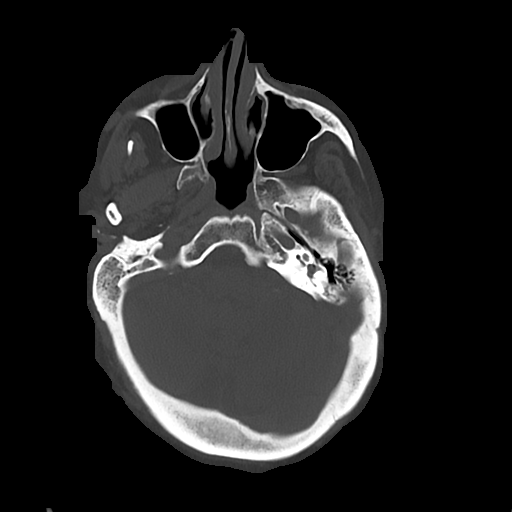
[im 25/82  bone]
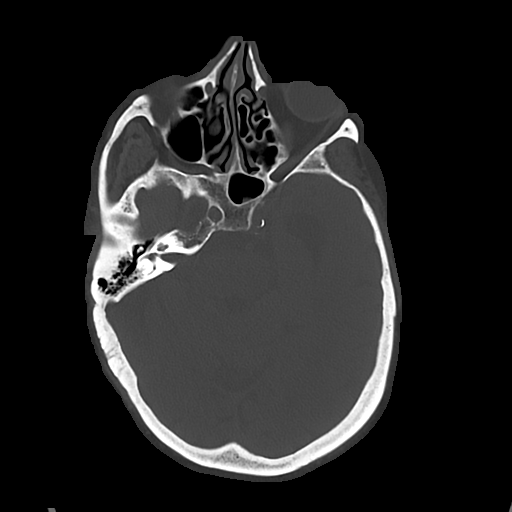

[Series 3: head wo · axial · 0.43mm/px · z∈[-160,-40]mm · 7 of 33 slices shown, 9 images]
[im 5/33  brain]
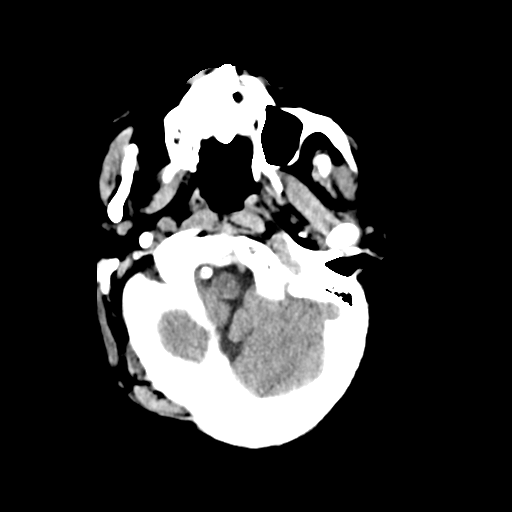
[im 5/33  bone]
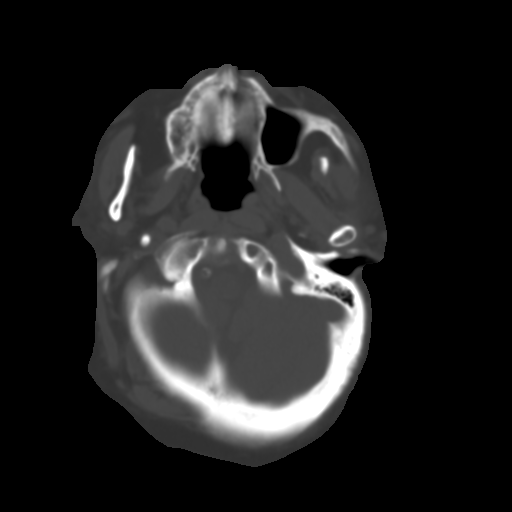
[im 9/33  brain]
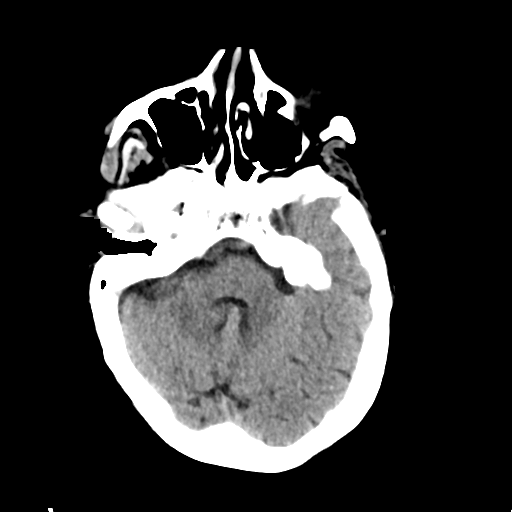
[im 13/33  brain]
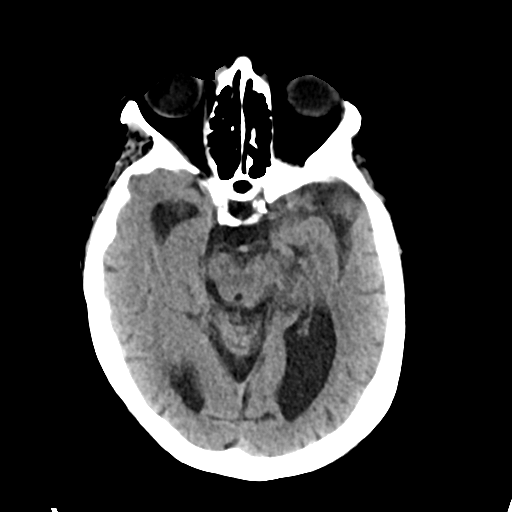
[im 17/33  brain]
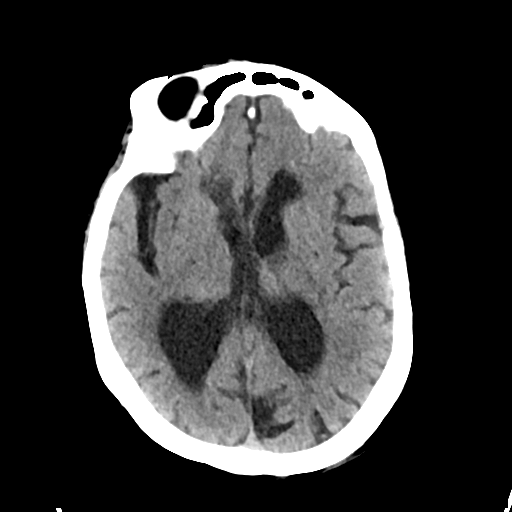
[im 21/33  brain]
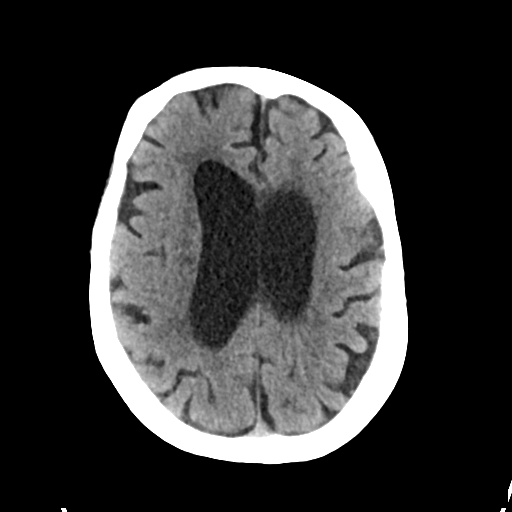
[im 21/33  bone]
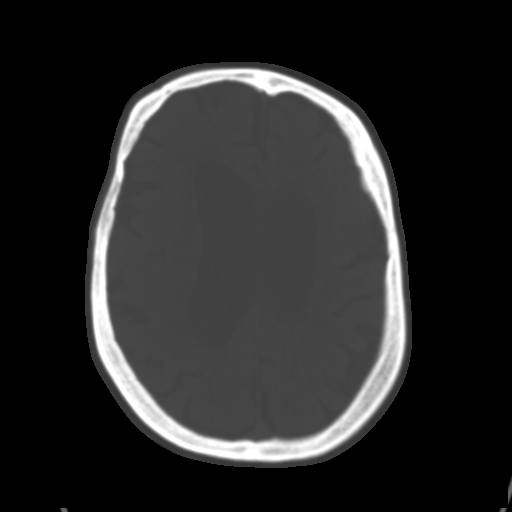
[im 25/33  brain]
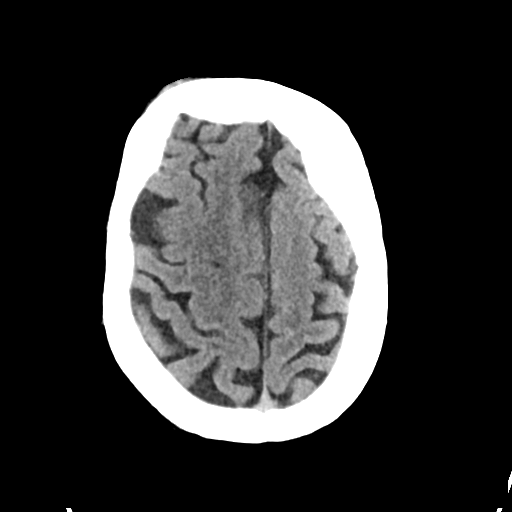
[im 29/33  brain]
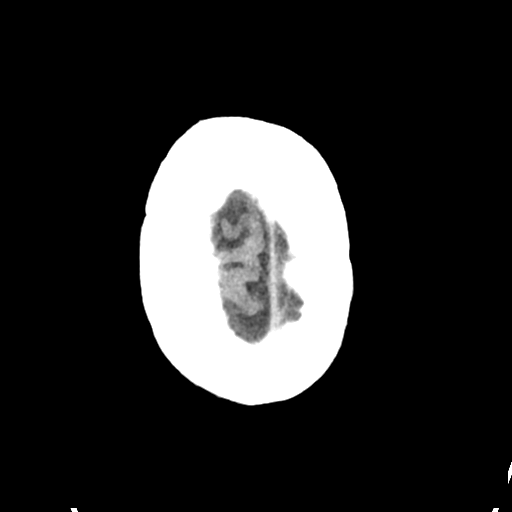

[Series 4: cor soft · coronal · 0.38mm/px · 3 of 69 slices shown]
[im 23/69  brain]
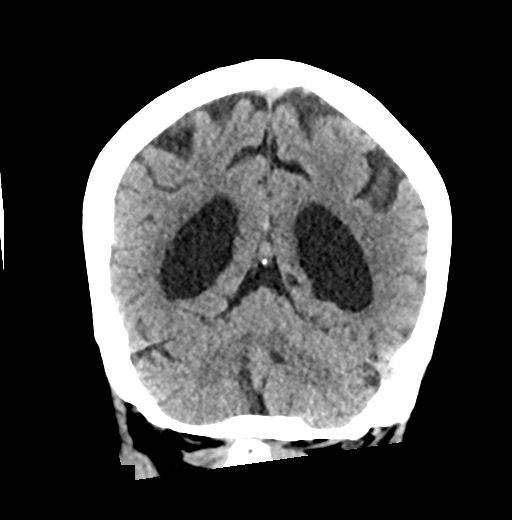
[im 31/69  brain]
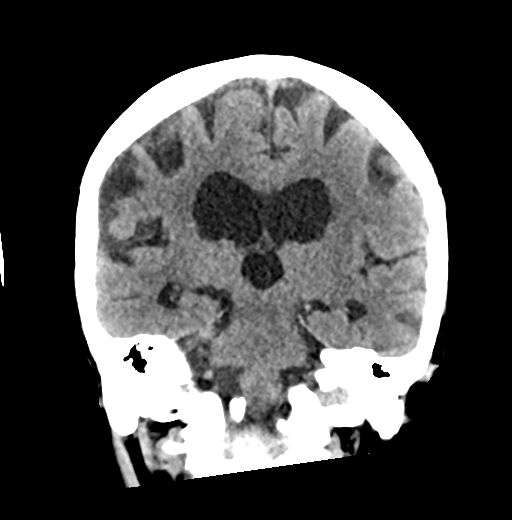
[im 38/69  brain]
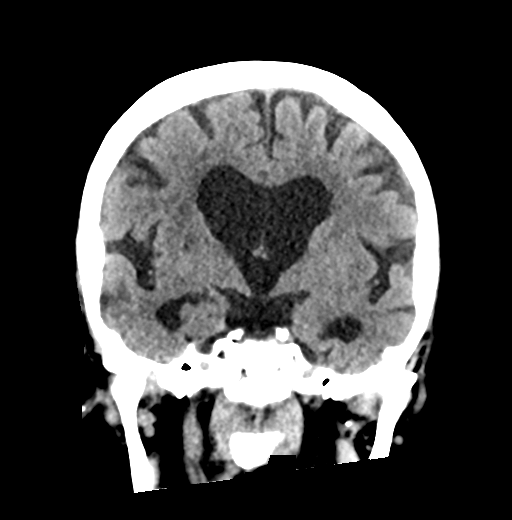

[Series 5: sag soft · sagittal · 0.38mm/px · 3 of 54 slices shown]
[im 20/54  brain]
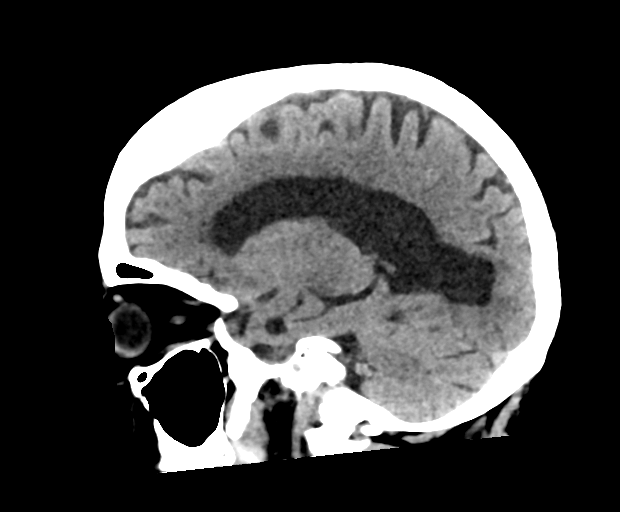
[im 27/54  brain]
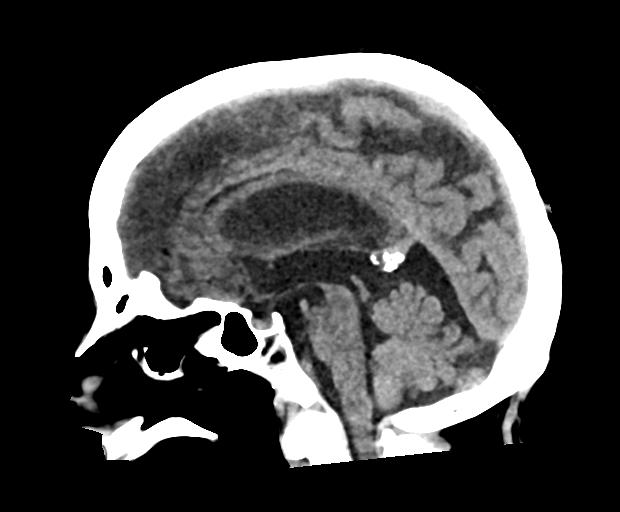
[im 34/54  brain]
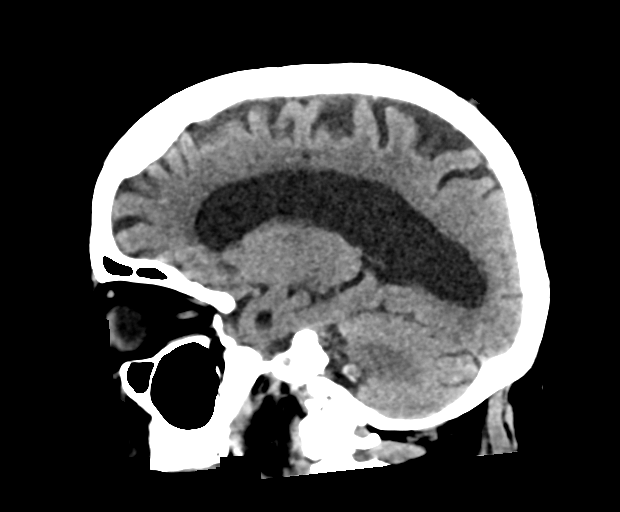

[16 of 47 positions shown; findings below may reference images not displayed]

FINDINGS: CT HEAD FINDINGS

Brain: No evidence of acute infarction, hemorrhage, hydrocephalus,
extra-axial collection or mass lesion/mass effect. Prominence of the
lateral ventricles, unchanged compared to prior examination and
likely related to global cerebral volume loss. Periventricular white
matter hypodensity.

Vascular: No hyperdense vessel or unexpected calcification.

Skull: Normal. Negative for fracture or focal lesion.

Sinuses/Orbits: No acute finding.

Other: Soft tissue contusion of the right forehead (series 3, image
25).

CT CERVICAL SPINE FINDINGS

Alignment: Straightening of the normal cervical lordosis with
unchanged minimal degenerative listhesis of C6 on C7.

Skull base and vertebrae: No acute fracture. Incidental note of
congenital posterior nonunion of C1. No primary bone lesion or focal
pathologic process.

Soft tissues and spinal canal: No prevertebral fluid or swelling. No
visible canal hematoma.

Disc levels: Moderate multilevel disc space height loss and
osteophytosis, worst at C4-C5 and C6-C7.

Upper chest: Negative.

Other: None.
IMPRESSION: 1. No acute intracranial pathology.
2. Soft tissue contusion of the right forehead.
3. No fracture or static subluxation of the cervical spine. Moderate
multilevel disc degenerative disease, worst at C4-C5 and C6-C7.
